# Patient Record
Sex: Male | Born: 1965 | Race: White | Hispanic: No | Marital: Married | Smoking: Never smoker
Health system: Southern US, Community
[De-identification: ages and names within clinical notes are randomized; demographics above are authoritative.]

## PROBLEM LIST (undated history)

## (undated) DIAGNOSIS — R5382 Chronic fatigue, unspecified: Secondary | ICD-10-CM

## (undated) DIAGNOSIS — IMO0002 Reserved for concepts with insufficient information to code with codable children: Secondary | ICD-10-CM

## (undated) DIAGNOSIS — Z86718 Personal history of other venous thrombosis and embolism: Secondary | ICD-10-CM

## (undated) DIAGNOSIS — I350 Nonrheumatic aortic (valve) stenosis: Secondary | ICD-10-CM

## (undated) DIAGNOSIS — F4310 Post-traumatic stress disorder, unspecified: Secondary | ICD-10-CM

## (undated) DIAGNOSIS — F1910 Other psychoactive substance abuse, uncomplicated: Secondary | ICD-10-CM

## (undated) DIAGNOSIS — K3189 Other diseases of stomach and duodenum: Secondary | ICD-10-CM

## (undated) DIAGNOSIS — R292 Abnormal reflex: Secondary | ICD-10-CM

## (undated) DIAGNOSIS — K7460 Unspecified cirrhosis of liver: Secondary | ICD-10-CM

## (undated) DIAGNOSIS — G4730 Sleep apnea, unspecified: Secondary | ICD-10-CM

## (undated) DIAGNOSIS — K5190 Ulcerative colitis, unspecified, without complications: Secondary | ICD-10-CM

## (undated) DIAGNOSIS — K635 Polyp of colon: Secondary | ICD-10-CM

## (undated) DIAGNOSIS — K766 Portal hypertension: Secondary | ICD-10-CM

## (undated) DIAGNOSIS — F1020 Alcohol dependence, uncomplicated: Secondary | ICD-10-CM

## (undated) DIAGNOSIS — T794XXA Traumatic shock, initial encounter: Secondary | ICD-10-CM

## (undated) DIAGNOSIS — I10 Essential (primary) hypertension: Secondary | ICD-10-CM

## (undated) DIAGNOSIS — F419 Anxiety disorder, unspecified: Secondary | ICD-10-CM

## (undated) DIAGNOSIS — F32A Depression, unspecified: Secondary | ICD-10-CM

## (undated) DIAGNOSIS — K219 Gastro-esophageal reflux disease without esophagitis: Secondary | ICD-10-CM

## (undated) DIAGNOSIS — T7840XA Allergy, unspecified, initial encounter: Secondary | ICD-10-CM

## (undated) HISTORY — DX: Personal history of other venous thrombosis and embolism: Z86.718

## (undated) HISTORY — DX: Polyp of colon: K63.5

## (undated) HISTORY — DX: Other diseases of stomach and duodenum: K76.6

## (undated) HISTORY — PX: HERNIA REPAIR: SHX51

## (undated) HISTORY — DX: Post-traumatic stress disorder, unspecified: F43.10

## (undated) HISTORY — DX: Reserved for concepts with insufficient information to code with codable children: IMO0002

## (undated) HISTORY — PX: CARPAL TUNNEL RELEASE: SHX101

## (undated) HISTORY — PX: SHOULDER SURGERY: SHX246

## (undated) HISTORY — PX: VASECTOMY: SHX75

## (undated) HISTORY — DX: Anxiety disorder, unspecified: F41.9

## (undated) HISTORY — DX: Nonrheumatic aortic (valve) stenosis: I35.0

## (undated) HISTORY — DX: Gastro-esophageal reflux disease without esophagitis: K21.9

## (undated) HISTORY — PX: KNEE SURGERY: SHX244

## (undated) HISTORY — DX: Depression, unspecified: F32.A

## (undated) HISTORY — DX: Abnormal reflex: R29.2

## (undated) HISTORY — PX: FRACTURE SURGERY: SHX138

## (undated) HISTORY — DX: Alcohol dependence, uncomplicated: F10.20

## (undated) HISTORY — DX: Sleep apnea, unspecified: G47.30

## (undated) HISTORY — DX: Chronic fatigue, unspecified: R53.82

## (undated) HISTORY — DX: Other psychoactive substance abuse, uncomplicated: F19.10

## (undated) HISTORY — DX: Other diseases of stomach and duodenum: K31.89

## (undated) HISTORY — DX: Essential (primary) hypertension: I10

## (undated) HISTORY — DX: Allergy, unspecified, initial encounter: T78.40XA

## (undated) HISTORY — DX: Unspecified cirrhosis of liver: K74.60

## (undated) HISTORY — DX: Ulcerative colitis, unspecified, without complications: K51.90

## (undated) HISTORY — DX: Traumatic shock, initial encounter: T79.4XXA

---

## 2007-03-24 ENCOUNTER — Ambulatory Visit (HOSPITAL_COMMUNITY): Admission: RE | Admit: 2007-03-24 | Discharge: 2007-03-24 | Payer: Self-pay | Admitting: Neurosurgery

## 2007-05-26 ENCOUNTER — Ambulatory Visit (HOSPITAL_COMMUNITY): Admission: RE | Admit: 2007-05-26 | Discharge: 2007-05-26 | Payer: Self-pay | Admitting: Neurosurgery

## 2011-01-05 NOTE — Op Note (Signed)
[name], Juan [name]             ACCOUNT NO.:  [id number]   MEDICAL RECORD NO.:  [id number]          PATIENT TYPE:  AMB   LOCATION:  SDS                          FACILITY:  [facility]   PHYSICIAN:  Faythe Ghee, M.D. DATE OF BIRTH:  [birth year]   DATE OF PROCEDURE:  03/24/2007  DATE OF DISCHARGE:                               OPERATIVE REPORT   PREOPERATIVE DIAGNOSIS:  Right carpal tunnel syndrome.   POSTOPERATIVE DIAGNOSIS:  Right carpal tunnel syndrome.   PROCEDURE:  Right carpal tunnel release.   SURGEON:  Dr. Hal Neer.   PROCEDURE IN DETAIL:  After induction of regional anesthetic on the  right upper extremity, the patient's hand, wrist and forearm were  prepped and draped in usual sterile fashion.  Curvilinear incision was  made starting at the wrist in line with the ring finger, heading up into  the palm then shifting in a radial direction.  Subcutaneous fat was  incised.  Self-retaining retractor was placed for exposure.  Transverse  carpal ligament was easily identified and incised in a proximal to  distal direction.  The nerve root was well visualized and progressively  decompressed as the markedly hypertrophic ligament was incised.  When  the nerve had been well decompressed all the way up into the mid palm  and all the way down to the distal wrist, inspection was carried out in  all directions for any evidence of residual compression, and none could  be identified.  Large amounts of irrigation carried out.  Any bleeding  was controlled with bipolar coagulation.  The wound was then closed with  interrupted Vicryl on the subcutaneous fat and interrupted nylon on the  skin.  Sterile dressing and Ace wrap were applied.  The patient was  taken to the recovery room in stable condition.           ______________________________  Faythe Ghee, M.D.     ROK/MEDQ  D:  03/24/2007  T:  03/24/2007  Job:  [id number]

## 2011-01-05 NOTE — Op Note (Signed)
Juan [name], Juan [name]             ACCOUNT NO.:  [id number]   MEDICAL RECORD NO.:  [id number]          PATIENT TYPE:  AMB   LOCATION:  SDS                          FACILITY:  [facility]   PHYSICIAN:  Faythe Ghee, M.D. DATE OF BIRTH:  1966/06/19   DATE OF PROCEDURE:  05/26/2007  DATE OF DISCHARGE:                               OPERATIVE REPORT   PREOPERATIVE DIAGNOSIS:  Left carpal tunnel syndrome.   POSTOPERATIVE DIAGNOSIS:  Left carpal tunnel syndrome.   PROCEDURE:  Left carpal tunnel release.   SURGEON:  Dr. Hal Neer.   PROCEDURE IN DETAIL:  After induction of regional anesthetic, the  patient's hand, wrist and forearm were prepped and draped in the usual  sterile fashion.  A curvilinear incision was made in the left palm  starting at the wrist in line with the ring finger heading up into the  palm and then curving slightly radially.  The subcutaneous tissue was  incised and self-retaining retractor was placed for exposure.  The  transverse carpal ligament was easily identified and dissected from a  proximal to distal direction to decompress the underlying median nerve  which was easily identified.  The ligament was found to be markedly  hypertrophic with great compression upon the nerve.  The nerve was  thoroughly decompressed both into the distal wrist and into the mid palm  until the nerve was completely decompressed.  At this time, any  adhesions were removed from the nerve.  Large amounts of irrigation were  carried out at this time.  The wound was then closed with intraoperative  Vicryl on the subcutaneous fat, interrupted nylon on the skin.  A bulky  sterile dressing was then applied along with an Ace wrap and the patient  was taken to the recovery room in stable condition.           ______________________________  Faythe Ghee, M.D.     ROK/MEDQ  D:  05/26/2007  T:  05/26/2007  Job:  [id number]

## 2011-06-03 LAB — CBC
MCV: 95.3
RBC: 4.83
WBC: 5.5

## 2011-06-03 LAB — BASIC METABOLIC PANEL
Chloride: 100
Creatinine, Ser: 1.06
GFR calc Af Amer: >60

## 2011-06-07 LAB — CBC
HCT: 48.7
Hemoglobin: 17.1 — ABNORMAL HIGH
MCV: 95.6
Platelets: 261
RBC: 5.1
WBC: 6.4

## 2014-07-29 DIAGNOSIS — M659 Synovitis and tenosynovitis, unspecified: Secondary | ICD-10-CM | POA: Insufficient documentation

## 2016-04-21 DIAGNOSIS — M75101 Unspecified rotator cuff tear or rupture of right shoulder, not specified as traumatic: Secondary | ICD-10-CM | POA: Insufficient documentation

## 2016-12-21 DIAGNOSIS — I10 Essential (primary) hypertension: Secondary | ICD-10-CM

## 2016-12-21 DIAGNOSIS — R5382 Chronic fatigue, unspecified: Secondary | ICD-10-CM | POA: Insufficient documentation

## 2016-12-21 HISTORY — DX: Essential (primary) hypertension: I10

## 2017-01-07 DIAGNOSIS — R748 Abnormal levels of other serum enzymes: Secondary | ICD-10-CM | POA: Insufficient documentation

## 2017-02-06 DIAGNOSIS — S96122A Laceration of muscle and tendon of long extensor muscle of toe at ankle and foot level, left foot, initial encounter: Secondary | ICD-10-CM | POA: Insufficient documentation

## 2017-02-06 HISTORY — DX: Laceration of muscle and tendon of long extensor muscle of toe at ankle and foot level, left foot, initial encounter: S96.122A

## 2017-11-10 ENCOUNTER — Encounter (HOSPITAL_BASED_OUTPATIENT_CLINIC_OR_DEPARTMENT_OTHER): Payer: Self-pay | Admitting: *Deleted

## 2017-11-10 ENCOUNTER — Other Ambulatory Visit: Payer: Self-pay

## 2017-11-10 DIAGNOSIS — K602 Anal fissure, unspecified: Secondary | ICD-10-CM | POA: Diagnosis not present

## 2017-11-10 DIAGNOSIS — K6289 Other specified diseases of anus and rectum: Secondary | ICD-10-CM | POA: Diagnosis present

## 2017-11-10 NOTE — ED Triage Notes (Signed)
Abdominal and rectal pain for a few days. Bright red rectal bleeding that his GI doctor is aware of. He was seen by a GI specialist 2 days ago. He is scheduled for a flex sigmoid next week. He is here tonight because he has a protrusion coming from his rectum. Hx of polyps in the past.

## 2017-11-11 ENCOUNTER — Encounter (HOSPITAL_BASED_OUTPATIENT_CLINIC_OR_DEPARTMENT_OTHER): Payer: Self-pay | Admitting: Emergency Medicine

## 2017-11-11 ENCOUNTER — Emergency Department (HOSPITAL_BASED_OUTPATIENT_CLINIC_OR_DEPARTMENT_OTHER)
Admission: EM | Admit: 2017-11-11 | Discharge: 2017-11-11 | Disposition: A | Discharge status: Home / Self Care | Payer: BLUE CROSS/BLUE SHIELD | Attending: Emergency Medicine | Admitting: Emergency Medicine

## 2017-11-11 DIAGNOSIS — K602 Anal fissure, unspecified: Secondary | ICD-10-CM

## 2017-11-11 LAB — OCCULT BLOOD X 1 CARD TO LAB, STOOL: Fecal Occult Bld: NEGATIVE

## 2017-11-11 MED ORDER — LIDOCAINE HCL 2 % EX GEL
1.0000 "application " | Freq: Once | CUTANEOUS | Status: AC
  Administered 2017-11-11: 02:00:00 via TOPICAL

## 2017-11-11 MED ORDER — LIDOCAINE HCL 2 % EX GEL
CUTANEOUS | Status: AC
  Filled 2017-11-11: qty 20

## 2017-11-11 NOTE — ED Provider Notes (Addendum)
[facility] EMERGENCY DEPARTMENT Provider Note   CSN: [id number] Arrival date & time: 11/10/17  2256     History   Chief Complaint Chief Complaint  Patient presents with  . Abdominal Pain    HPI Juan [name] is a 52 y.o. male.  The history is provided by the patient.  Illness  This is a new problem. The current episode started more than 1 week ago (a month ago). The problem occurs constantly. The problem has not changed since onset.Pertinent negatives include no chest pain, no abdominal pain and no shortness of breath. Nothing aggravates the symptoms. Nothing relieves the symptoms. He has tried nothing for the symptoms. The treatment provided no relief.  Has has red blood on tissue and sensation of rectal mass and straining.  Has seen GI and they have not yet found anything.  Scheduled for a flex sign at [facility] this coming week.    History reviewed. No pertinent past medical history.  There are no active problems to display for this patient.   Past Surgical History:  Procedure Laterality Date  . HERNIA REPAIR    . VASECTOMY         Home Medications    Prior to Admission medications   Not on File    Family History No family history on file.  Social History Social History   Tobacco Use  . Smoking status: Never Smoker  . Smokeless tobacco: Current User    Types: Snuff  Substance Use Topics  . Alcohol use: Yes  . Drug use: Never     Allergies   Codeine and Depakote er [divalproex sodium er]   Review of Systems Review of Systems  Constitutional: Negative for fever.  Respiratory: Negative for shortness of breath.   Cardiovascular: Negative for chest pain.  Gastrointestinal: Positive for anal bleeding, constipation and rectal pain. Negative for abdominal pain, blood in stool, diarrhea, nausea and vomiting.  Genitourinary: Negative for dysuria.  Musculoskeletal: Negative for back pain.  Neurological: Negative for light-headedness.  All  other systems reviewed and are negative.    Physical Exam Updated Vital Signs BP (!) 99/58 (BP Location: Left Arm)   Pulse 77   Temp 98 F (36.7 C) (Oral)   Resp 16   Ht 5\' 5"  (1.651 m)   Wt 73.9 kg (163 lb)   SpO2 96%   BMI 27.12 kg/m   Physical Exam  Constitutional: He is oriented to person, place, and time. He appears well-developed and well-nourished. No distress.  HENT:  Head: Normocephalic and atraumatic.  Mouth/Throat: No oropharyngeal exudate.  Eyes: Pupils are equal, round, and reactive to light. Conjunctivae are normal.  Neck: Normal range of motion. Neck supple. No JVD present.  Cardiovascular: Normal rate, regular rhythm, normal heart sounds and intact distal pulses.  Pulmonary/Chest: Effort normal and breath sounds normal. No stridor. He has no wheezes. He has no rales.  Abdominal: Soft. Bowel sounds are normal. He exhibits no mass. There is no tenderness. There is no rebound and no guarding. No hernia.  Genitourinary: Rectal exam shows guaiac negative stool.  Musculoskeletal: Normal range of motion.  Neurological: He is alert and oriented to person, place, and time. He displays normal reflexes.  Skin: Skin is warm and dry. Capillary refill takes less than 2 seconds.  Psychiatric: He has a normal mood and affect.     ED Treatments / Results  Labs (all labs ordered are listed, but only abnormal results are displayed) Results for orders placed or performed  during the hospital encounter of 11/11/17  Occult blood card to lab, stool  Result Value Ref Range   Fecal Occult Bld NEGATIVE NEGATIVE   No results found.   Radiology No results found.  Procedures LOWER ENDOSCOPY Date/Time: 11/11/2017 5:20 AM Performed by: Cy BlamerPalumbo, Athan Casalino, MD Authorized by: Cy BlamerPalumbo, Harper Vandervoort, MD  Consent: Verbal consent obtained. Consent given by: patient Patient understanding: patient states understanding of the procedure being performed Patient identity confirmed: arm  band Indications: rectal bleeding and rectal pain  Anesthesia: Local anesthetic: lidocaine jelly.  Sedation: Patient sedated: no  Scope type: anoscope External exam performed: yes Negative external exam findings: no pilonidal sinus tract, no pilonidal cyst, no pilonidal tenderness, no perianal skin tags, no perirectal warts, no perianal maceration, no perianal induration and no perianal erythema Positive internal exam findings: anal fissures Negative internal exam findings: no intraluminal mass, no inflammation, no anal fistulae, no anal stricture and no abscess Anal fissure position: six o'clock Procedure termination: procedure complete Patient tolerance: Patient tolerated the procedure well with no immediate complications     (including critical care time)  Medications Ordered in ED Medications  lidocaine (XYLOCAINE) 2 % jelly 1 application ( Topical Given by Other 11/11/17 0141)       Final Clinical Impressions(s) / ED Diagnoses   Final diagnoses:  Anal fissure    Follow up with GI as previously scheduled for flex sig  Return for weakness, numbness, changes in vision or speech, fevers >100.4 unrelieved by medication, shortness of breath, intractable vomiting, or diarrhea, abdominal pain, Inability to tolerate liquids or food, cough, altered mental status or any concerns. No signs of systemic illness or infection. The patient is nontoxic-appearing on exam and vital signs are within normal limits.   I have reviewed the triage vital signs and the nursing notes. Pertinent labs &imaging results that were available during my care of the patient were reviewed by me and considered in my medical decision making (see chart for details).  After history, exam, and medical workup I feel the patient has been appropriately medically screened and is safe for discharge home. Pertinent diagnoses were discussed with the patient. Patient was given return precautions.   Dejane Scheibe,  Dominyck Reser, MD 11/11/17 [id number]    Cy BlamerPalumbo, Secilia Apps, MD 11/11/17 [id number]

## 2017-12-02 DIAGNOSIS — K5190 Ulcerative colitis, unspecified, without complications: Secondary | ICD-10-CM | POA: Insufficient documentation

## 2018-03-12 ENCOUNTER — Emergency Department (HOSPITAL_BASED_OUTPATIENT_CLINIC_OR_DEPARTMENT_OTHER): Payer: BLUE CROSS/BLUE SHIELD

## 2018-03-12 ENCOUNTER — Encounter (HOSPITAL_BASED_OUTPATIENT_CLINIC_OR_DEPARTMENT_OTHER): Payer: Self-pay

## 2018-03-12 ENCOUNTER — Emergency Department (HOSPITAL_BASED_OUTPATIENT_CLINIC_OR_DEPARTMENT_OTHER)
Admission: EM | Admit: 2018-03-12 | Discharge: 2018-03-12 | Disposition: A | Discharge status: Home / Self Care | Payer: BLUE CROSS/BLUE SHIELD | Attending: Emergency Medicine | Admitting: Emergency Medicine

## 2018-03-12 DIAGNOSIS — Y9239 Other specified sports and athletic area as the place of occurrence of the external cause: Secondary | ICD-10-CM | POA: Insufficient documentation

## 2018-03-12 DIAGNOSIS — S299XXA Unspecified injury of thorax, initial encounter: Secondary | ICD-10-CM | POA: Diagnosis present

## 2018-03-12 DIAGNOSIS — Y9389 Activity, other specified: Secondary | ICD-10-CM | POA: Insufficient documentation

## 2018-03-12 DIAGNOSIS — Y998 Other external cause status: Secondary | ICD-10-CM | POA: Insufficient documentation

## 2018-03-12 DIAGNOSIS — S20212A Contusion of left front wall of thorax, initial encounter: Secondary | ICD-10-CM | POA: Diagnosis not present

## 2018-03-12 DIAGNOSIS — W01198A Fall on same level from slipping, tripping and stumbling with subsequent striking against other object, initial encounter: Secondary | ICD-10-CM | POA: Insufficient documentation

## 2018-03-12 MED ORDER — ONDANSETRON 4 MG PO TBDP
4.0000 mg | ORAL_TABLET | Freq: Once | ORAL | Status: AC
  Administered 2018-03-12: 4 mg via ORAL
  Filled 2018-03-12: qty 1

## 2018-03-12 MED ORDER — HYDROCODONE-ACETAMINOPHEN 5-325 MG PO TABS: 1.0000 | ORAL_TABLET | Freq: Four times a day (QID) | ORAL | 0 refills | Status: DC | PRN

## 2018-03-12 MED ORDER — HYDROCODONE-ACETAMINOPHEN 5-325 MG PO TABS
2.0000 | ORAL_TABLET | Freq: Once | ORAL | Status: AC
  Administered 2018-03-12: 2 via ORAL
  Filled 2018-03-12: qty 2

## 2018-03-12 MED ORDER — IBUPROFEN 800 MG PO TABS
800.0000 mg | ORAL_TABLET | Freq: Once | ORAL | Status: AC
  Administered 2018-03-12: 800 mg via ORAL
  Filled 2018-03-12: qty 1
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
## 2018-03-12 NOTE — Progress Notes (Signed)
PT instructed on Incentive Spirometer per MD request.  Pt performed times 5 with great technique and effort reaching 1800-206700ml.  Pt instructed on frequency and importance.  Pt stated he understood and had no questions.  RT will continue to monitor.
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
## 2018-03-12 NOTE — ED Notes (Signed)
Pt teaching provided on medications that may cause drowsiness. Pt instructed not to drive or operate heavy machinery while taking the prescribed medication. Pt verbalized understanding.   

## 2018-03-22 ENCOUNTER — Emergency Department (HOSPITAL_BASED_OUTPATIENT_CLINIC_OR_DEPARTMENT_OTHER)
Admission: EM | Admit: 2018-03-22 | Discharge: 2018-03-23 | Disposition: A | Discharge status: Home / Self Care | Payer: BLUE CROSS/BLUE SHIELD | Attending: Emergency Medicine | Admitting: Emergency Medicine

## 2018-03-22 ENCOUNTER — Other Ambulatory Visit: Payer: Self-pay

## 2018-03-22 ENCOUNTER — Emergency Department (HOSPITAL_BASED_OUTPATIENT_CLINIC_OR_DEPARTMENT_OTHER): Payer: BLUE CROSS/BLUE SHIELD

## 2018-03-22 ENCOUNTER — Encounter (HOSPITAL_BASED_OUTPATIENT_CLINIC_OR_DEPARTMENT_OTHER): Payer: Self-pay

## 2018-03-22 DIAGNOSIS — E876 Hypokalemia: Secondary | ICD-10-CM | POA: Diagnosis not present

## 2018-03-22 DIAGNOSIS — Y929 Unspecified place or not applicable: Secondary | ICD-10-CM | POA: Diagnosis not present

## 2018-03-22 DIAGNOSIS — S2232XA Fracture of one rib, left side, initial encounter for closed fracture: Secondary | ICD-10-CM | POA: Diagnosis not present

## 2018-03-22 DIAGNOSIS — Y939 Activity, unspecified: Secondary | ICD-10-CM | POA: Insufficient documentation

## 2018-03-22 DIAGNOSIS — S299XXA Unspecified injury of thorax, initial encounter: Secondary | ICD-10-CM | POA: Diagnosis present

## 2018-03-22 DIAGNOSIS — Y998 Other external cause status: Secondary | ICD-10-CM | POA: Insufficient documentation

## 2018-03-22 DIAGNOSIS — R1012 Left upper quadrant pain: Secondary | ICD-10-CM | POA: Diagnosis not present

## 2018-03-22 HISTORY — DX: Hemochromatosis, unspecified: E83.119

## 2018-03-22 LAB — BASIC METABOLIC PANEL
ANION GAP: 12 (ref 5–15)
BUN: 7 mg/dL (ref 6–20)
CALCIUM: 9 mg/dL (ref 8.9–10.3)
CO2: 26 mmol/L (ref 22–32)
Chloride: 101 mmol/L (ref 98–111)
Creatinine, Ser: 0.96 mg/dL (ref 0.61–1.24)
GFR calc Af Amer: >60 mL/min (ref >60)
Glucose, Bld: 91 mg/dL (ref 70–99)
POTASSIUM: 2.8 mmol/L — AB (ref 3.5–5.1)
SODIUM: 139 mmol/L (ref 135–145)

## 2018-03-22 MED ORDER — OXYCODONE-ACETAMINOPHEN 5-325 MG PO TABS
1.0000 | ORAL_TABLET | Freq: Once | ORAL | Status: AC
  Administered 2018-03-22: 1 via ORAL
  Filled 2018-03-22: qty 1

## 2018-03-22 MED ORDER — KETOROLAC TROMETHAMINE 30 MG/ML IJ SOLN: 30.0000 mg | Freq: Once | INTRAMUSCULAR | Status: DC

## 2018-03-22 MED ORDER — POTASSIUM CHLORIDE CRYS ER 20 MEQ PO TBCR
40.0000 meq | EXTENDED_RELEASE_TABLET | Freq: Once | ORAL | Status: AC
  Administered 2018-03-22: 40 meq via ORAL
  Filled 2018-03-22: qty 2

## 2018-03-22 MED ORDER — KETOROLAC TROMETHAMINE 30 MG/ML IJ SOLN
30.0000 mg | Freq: Once | INTRAMUSCULAR | Status: AC
  Administered 2018-03-22: 30 mg via INTRAMUSCULAR
  Filled 2018-03-22: qty 1

## 2018-03-22 MED ORDER — IOPAMIDOL (ISOVUE-300) INJECTION 61%
100.0000 mL | Freq: Once | INTRAVENOUS | Status: AC | PRN
  Administered 2018-03-22: 100 mL via INTRAVENOUS

## 2018-03-22 NOTE — ED Notes (Signed)
ED Provider at bedside. 
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
## 2018-03-22 NOTE — ED Notes (Signed)
Patient transported to CT 
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
## 2018-03-23 LAB — CBC WITH DIFFERENTIAL/PLATELET
BASOS ABS: 0 10*3/uL (ref 0.0–0.1)
Basophils Relative: 0 %
EOS ABS: 0.3 10*3/uL (ref 0.0–0.7)
Eosinophils Relative: 5 %
HCT: 38.1 % — ABNORMAL LOW (ref 39.0–52.0)
Hemoglobin: 14.3 g/dL (ref 13.0–17.0)
LYMPHS ABS: 2.8 10*3/uL (ref 0.7–4.0)
Lymphocytes Relative: 43 %
MCH: 35.9 pg — ABNORMAL HIGH (ref 26.0–34.0)
MCHC: 37.5 g/dL — ABNORMAL HIGH (ref 30.0–36.0)
MCV: 95.7 fL (ref 78.0–100.0)
MONO ABS: 0.9 10*3/uL (ref 0.1–1.0)
Monocytes Relative: 13 %
NEUTROS PCT: 39 %
Neutro Abs: 2.6 10*3/uL (ref 1.7–7.7)
PLATELETS: 133 10*3/uL — AB (ref 150–400)
RBC: 3.98 MIL/uL — AB (ref 4.22–5.81)
RDW: 13.5 % (ref 11.5–15.5)
WBC: 6.6 10*3/uL (ref 4.0–10.5)

## 2018-03-23 MED ORDER — POTASSIUM CHLORIDE ER 20 MEQ PO TBCR: 20.0000 meq | EXTENDED_RELEASE_TABLET | Freq: Two times a day (BID) | ORAL | 0 refills | Status: DC

## 2018-03-23 MED ORDER — HYDROCODONE-ACETAMINOPHEN 5-325 MG PO TABS: 1.0000 | ORAL_TABLET | Freq: Four times a day (QID) | ORAL | 0 refills | Status: DC | PRN

## 2018-03-23 NOTE — Discharge Instructions (Addendum)
Take ibuprofen for pain.  Norco for severe pain only.  Take potassium supplements as prescribed.  Increase your potassium intake.  Follow-up with your doctor as needed.

## 2018-03-23 NOTE — ED Notes (Signed)
ED Provider at bedside. 

## 2018-11-18 DIAGNOSIS — G729 Myopathy, unspecified: Secondary | ICD-10-CM | POA: Insufficient documentation

## 2018-11-18 DIAGNOSIS — G721 Alcoholic myopathy: Secondary | ICD-10-CM

## 2018-11-18 HISTORY — DX: Alcoholic myopathy: G72.1

## 2019-01-19 DIAGNOSIS — Z8719 Personal history of other diseases of the digestive system: Secondary | ICD-10-CM

## 2019-01-19 DIAGNOSIS — Z9889 Other specified postprocedural states: Secondary | ICD-10-CM | POA: Insufficient documentation

## 2019-01-19 HISTORY — DX: Personal history of other diseases of the digestive system: Z87.19

## 2019-04-15 DIAGNOSIS — F332 Major depressive disorder, recurrent severe without psychotic features: Secondary | ICD-10-CM

## 2019-04-15 HISTORY — DX: Major depressive disorder, recurrent severe without psychotic features: F33.2

## 2019-04-16 DIAGNOSIS — F4312 Post-traumatic stress disorder, chronic: Secondary | ICD-10-CM | POA: Insufficient documentation

## 2019-04-16 DIAGNOSIS — F411 Generalized anxiety disorder: Secondary | ICD-10-CM | POA: Insufficient documentation

## 2019-04-16 DIAGNOSIS — F4310 Post-traumatic stress disorder, unspecified: Secondary | ICD-10-CM | POA: Insufficient documentation

## 2019-09-30 DIAGNOSIS — K709 Alcoholic liver disease, unspecified: Secondary | ICD-10-CM | POA: Insufficient documentation

## 2019-10-15 LAB — HM COLONOSCOPY

## 2019-12-05 DIAGNOSIS — K208 Other esophagitis without bleeding: Secondary | ICD-10-CM | POA: Insufficient documentation

## 2019-12-05 HISTORY — DX: Other esophagitis without bleeding: K20.80

## 2020-01-17 DIAGNOSIS — S128XXA Fracture of other parts of neck, initial encounter: Secondary | ICD-10-CM

## 2020-01-17 HISTORY — DX: Fracture of other parts of neck, initial encounter: S12.8XXA

## 2020-05-05 DIAGNOSIS — M25511 Pain in right shoulder: Secondary | ICD-10-CM | POA: Diagnosis not present

## 2020-05-05 DIAGNOSIS — I10 Essential (primary) hypertension: Secondary | ICD-10-CM | POA: Diagnosis not present

## 2020-05-05 DIAGNOSIS — R202 Paresthesia of skin: Secondary | ICD-10-CM | POA: Diagnosis not present

## 2020-05-05 DIAGNOSIS — Z23 Encounter for immunization: Secondary | ICD-10-CM | POA: Diagnosis not present

## 2020-05-05 DIAGNOSIS — Z0189 Encounter for other specified special examinations: Secondary | ICD-10-CM | POA: Diagnosis not present

## 2020-05-06 DIAGNOSIS — F17220 Nicotine dependence, chewing tobacco, uncomplicated: Secondary | ICD-10-CM | POA: Diagnosis not present

## 2020-05-06 DIAGNOSIS — F1021 Alcohol dependence, in remission: Secondary | ICD-10-CM | POA: Diagnosis not present

## 2020-05-06 DIAGNOSIS — F419 Anxiety disorder, unspecified: Secondary | ICD-10-CM | POA: Diagnosis not present

## 2020-05-06 DIAGNOSIS — G4700 Insomnia, unspecified: Secondary | ICD-10-CM | POA: Diagnosis not present

## 2020-05-07 DIAGNOSIS — Z0189 Encounter for other specified special examinations: Secondary | ICD-10-CM | POA: Diagnosis not present

## 2020-05-20 ENCOUNTER — Ambulatory Visit (INDEPENDENT_AMBULATORY_CARE_PROVIDER_SITE_OTHER): Payer: 59

## 2020-05-20 ENCOUNTER — Ambulatory Visit (INDEPENDENT_AMBULATORY_CARE_PROVIDER_SITE_OTHER): Payer: 59 | Admitting: Family Medicine

## 2020-05-20 ENCOUNTER — Encounter: Payer: Self-pay | Admitting: Family Medicine

## 2020-05-20 ENCOUNTER — Other Ambulatory Visit: Payer: Self-pay

## 2020-05-20 VITALS — BP 144/89 | HR 89 | Temp 98.3°F | Wt 173.3 lb

## 2020-05-20 DIAGNOSIS — F1011 Alcohol abuse, in remission: Secondary | ICD-10-CM | POA: Insufficient documentation

## 2020-05-20 DIAGNOSIS — F4312 Post-traumatic stress disorder, chronic: Secondary | ICD-10-CM | POA: Diagnosis not present

## 2020-05-20 DIAGNOSIS — M5412 Radiculopathy, cervical region: Secondary | ICD-10-CM | POA: Diagnosis not present

## 2020-05-20 DIAGNOSIS — M4802 Spinal stenosis, cervical region: Secondary | ICD-10-CM | POA: Diagnosis not present

## 2020-05-20 DIAGNOSIS — R222 Localized swelling, mass and lump, trunk: Secondary | ICD-10-CM | POA: Diagnosis not present

## 2020-05-20 DIAGNOSIS — K709 Alcoholic liver disease, unspecified: Secondary | ICD-10-CM | POA: Diagnosis not present

## 2020-05-20 DIAGNOSIS — M25511 Pain in right shoulder: Secondary | ICD-10-CM

## 2020-05-20 DIAGNOSIS — M4722 Other spondylosis with radiculopathy, cervical region: Secondary | ICD-10-CM | POA: Diagnosis not present

## 2020-05-20 DIAGNOSIS — M7541 Impingement syndrome of right shoulder: Secondary | ICD-10-CM

## 2020-05-20 DIAGNOSIS — I10 Essential (primary) hypertension: Secondary | ICD-10-CM | POA: Diagnosis not present

## 2020-05-20 HISTORY — DX: Impingement syndrome of right shoulder: M75.41

## 2020-05-20 HISTORY — DX: Alcohol abuse, in remission: F10.11

## 2020-05-20 MED ORDER — PREDNISONE 10 MG (21) PO TBPK: ORAL_TABLET | ORAL | 0 refills | Status: DC

## 2020-05-20 NOTE — Assessment & Plan Note (Addendum)
Reports 140 days of sobriety.  He attends support group meetings, encouraged to continue with this.   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  Continue current vitamins and supplements.  EGD earlier this year without varices.

## 2020-05-20 NOTE — Assessment & Plan Note (Signed)
He has radicular symptoms as well as some pain with ROM testing.  No significant rotator cuff weakness.  Xrays of shoulder and neck ordered.  Start prednisone taper.  Discussed that if this is not improving I would like to have Dr. Darene Lamer see him.

## 2020-05-20 NOTE — Assessment & Plan Note (Signed)
Managed by psychiatry at the [place].

## 2020-05-20 NOTE — Patient Instructions (Signed)
Great to meet you today! Have xray completed downstairs.  Start prednisone taper.  If not improving with this let me know.

## 2020-05-20 NOTE — Progress Notes (Signed)
Juan [name] - 54 y.o. male MRN [id number]  Date of birth: 1966-05-27  Subjective Chief Complaint  Patient presents with  . Establish Care  . Fall    HPI Juan [name] is a a 54 y.o. male here today for initial visit.  He has history of HTN, depression with anxiety and PTSD, and alcohol abuse.   He has complaint of R shoulder pain today.   -Shoulder pain:  Reports having a fall into a wall while walking up stairs a couple of weeks ago impacting his R shoulder.  He has had pain on the R side of his neck that radiates into his shoulder and down his arm.  He has had some numbness and tingling in the 4th and 5th digit of the R hand.  He has pain with movement of the shoulder as well.  He has had previous surgery on R shoulder due to prior rotator cuff tear with SLAP lesion  -PTSD/Depression/Anxiety:  Related to previous [employer]. This is managed by psychiatry at the [place]. He was also seen by outpatient psychiatrist through [facility] in July.  Current medications include lexapro, buspar and mitrazapine.  Stable symptoms with current medications.  He does have history of alcoholism, reports that he is 140 days sober.  He does attend support group meetings  -HTN:  Managed with lisinopril 66m daily.  He is doing well with this and denies symptoms related to HTN including chest pain, shortness of breath, palpitations, or vision changes.    ROS:  A comprehensive ROS was completed and negative except as noted per HPI  Allergies  Allergen Reactions  . Charentais Melon (French Melon) Anaphylaxis  . Cucumber Extract Itching and Nausea And Vomiting    No extracts; just cucumber   . Other Anaphylaxis  . Peanut Butter Flavor Anaphylaxis and Swelling  . Peanut Oil Swelling  . Shellfish Allergy Itching and Swelling  . Cantaloupe Extract Allergy Skin Test Rash  . Lactose Other (See Comments)  . Strawberry Extract Nausea And Vomiting and Swelling  . Vancomycin Rash  . Codeine     itching   . Depakote Er [Divalproex Sodium Er]     Tongue swelling    Past Medical History:  Diagnosis Date  . [place] ([place]   . Anxiety   . Colon polyps   . Depression   . Hemochromatosis   . Hx of blood clots    Leg  . Hypertension     Past Surgical History:  Procedure Laterality Date  . FRACTURE SURGERY    . HERNIA REPAIR    . VASECTOMY      Social History   Socioeconomic History  . Marital status: Married    Spouse name: Not on file  . Number of children: Not on file  . Years of education: Not on file  . Highest education level: Not on file  Occupational History  . Not on file  Tobacco Use  . Smoking status: Never Smoker  . Smokeless tobacco: Current User    Types: Snuff  Vaping Use  . Vaping Use: Never used  Substance and Sexual Activity  . Alcohol use: Not Currently  . Drug use: Never  . Sexual activity: Yes    Partners: Female  Other Topics Concern  . Not on file  Social History Narrative  . Not on file   Social Determinants of Health   Financial Resource Strain:   . Difficulty of Paying Living Expenses: Not on file  Food Insecurity:   .  Worried About Charity fundraiser in the Last Year: Not on file  . Ran Out of Food in the Last Year: Not on file  Transportation Needs:   . Lack of Transportation (Medical): Not on file  . Lack of Transportation (Non-Medical): Not on file  Physical Activity:   . Days of Exercise per Week: Not on file  . Minutes of Exercise per Session: Not on file  Stress:   . Feeling of Stress : Not on file  Social Connections:   . Frequency of Communication with Friends and Family: Not on file  . Frequency of Social Gatherings with Friends and Family: Not on file  . Attends Religious Services: Not on file  . Active Member of Clubs or Organizations: Not on file  . Attends Archivist Meetings: Not on file  . Marital Status: Not on file    No family history on file.  Health Maintenance  Topic Date Due  .  Hepatitis C Screening  Never done  . HIV Screening  Never done  . COLONOSCOPY  Never done  . INFLUENZA VACCINE  03/23/2020  . TETANUS/TDAP  06/20/2029  . COVID-19 Vaccine  Completed     ----------------------------------------------------------------------------------------------------------------------------------------------------------------------------------------------------------------- Physical Exam BP (!) 144/89 (BP Location: Left Arm, Patient Position: Sitting, Cuff Size: Normal)   Pulse 89   Temp 98.3 F (36.8 C) (Oral)   Wt 173 lb 4.8 oz (78.6 kg)   SpO2 95%   BMI 28.84 kg/m   Physical Exam Constitutional:      Appearance: Normal appearance.  HENT:     Head: Normocephalic.  Eyes:     General: No scleral icterus. Cardiovascular:     Rate and Rhythm: Normal rate and regular rhythm.  Pulmonary:     Effort: Pulmonary effort is normal.     Breath sounds: Normal breath sounds.  Musculoskeletal:     Cervical back: Neck supple.     Comments: TTP along upper trapezius.  ROM is normal but with pain on full abduction and flexion.  Rotator cuff strength is normal.  Negative impingement testing.  +Spurlings on R  Neurological:     General: No focal deficit present.     Mental Status: He is alert.  Psychiatric:        Mood and Affect: Mood normal.        Behavior: Behavior normal.     ------------------------------------------------------------------------------------------------------------------------------------------------------------------------------------------------------------------- Assessment and Plan  Benign essential hypertension BP mildly elevated today.  Readings well controlled at home.   Recommend low sodium diet and continuation of lisinopril.    Chronic post-traumatic stress disorder (PTSD) Managed by psychiatry at the [place].    History of alcohol abuse Reports 140 days of sobriety.  He attends support group meetings, encouraged to continue with  this.   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  Continue current vitamins and supplements.  EGD earlier this year without varices.    Acute pain of right shoulder He has radicular symptoms as well as some pain with ROM testing.  No significant rotator cuff weakness.  Xrays of shoulder and neck ordered.  Start prednisone taper.  Discussed that if this is not improving I would like to have Dr. Darene Lamer see him.    Meds ordered this encounter  Medications  . predniSONE (STERAPRED UNI-PAK 21 TAB) 10 MG (21) TBPK tablet    Sig: Taper as directed on packaging.    Dispense:  21 tablet    Refill:  0  No follow-ups on file.    This visit occurred during the SARS-CoV-2 public health emergency.  Safety protocols were in place, including screening questions prior to the visit, additional usage of staff PPE, and extensive cleaning of exam room while observing appropriate contact time as indicated for disinfecting solutions.

## 2020-05-20 NOTE — Assessment & Plan Note (Signed)
BP mildly elevated today.  Readings well controlled at home.   Recommend low sodium diet and continuation of lisinopril.

## 2020-05-21 ENCOUNTER — Encounter: Payer: Self-pay | Admitting: Family Medicine

## 2020-05-26 ENCOUNTER — Encounter: Payer: Self-pay | Admitting: Family Medicine

## 2020-05-29 ENCOUNTER — Other Ambulatory Visit: Payer: Self-pay | Admitting: Family Medicine

## 2020-05-29 MED ORDER — FOLIC ACID 1 MG PO TABS
1.0000 mg | ORAL_TABLET | Freq: Every day | ORAL | 1 refills | Status: DC
Start: 2020-05-29 — End: 2020-05-29

## 2020-05-29 MED ORDER — LISINOPRIL 10 MG PO TABS: 10.0000 mg | ORAL_TABLET | Freq: Every day | ORAL | 3 refills | Status: DC

## 2020-05-29 MED FILL — FOLIC ACID 1 MG TABS: 1 | 90 days supply | Qty: 90 | Fill #0

## 2020-05-29 MED FILL — LISINOPRIL 10 MG TABS: 10 | 90 days supply | Qty: 90 | Fill #0

## 2020-05-29 NOTE — Telephone Encounter (Signed)
Pt is requesting med refills for lisinopril and folic acid. Written by historical provider. Rxs pended.

## 2020-06-03 ENCOUNTER — Ambulatory Visit: Payer: 59 | Admitting: Family Medicine

## 2020-06-04 DIAGNOSIS — R262 Difficulty in walking, not elsewhere classified: Secondary | ICD-10-CM | POA: Diagnosis not present

## 2020-06-04 DIAGNOSIS — G7100 Muscular dystrophy, unspecified: Secondary | ICD-10-CM | POA: Diagnosis not present

## 2020-06-06 DIAGNOSIS — M25511 Pain in right shoulder: Secondary | ICD-10-CM | POA: Diagnosis not present

## 2020-06-11 ENCOUNTER — Ambulatory Visit: Payer: 59 | Admitting: Family Medicine

## 2020-06-23 ENCOUNTER — Ambulatory Visit: Payer: BLUE CROSS/BLUE SHIELD | Admitting: Family Medicine

## 2020-07-08 ENCOUNTER — Other Ambulatory Visit: Payer: Self-pay | Admitting: Family Medicine

## 2020-07-08 ENCOUNTER — Encounter: Payer: Self-pay | Admitting: Family Medicine

## 2020-07-08 ENCOUNTER — Telehealth (INDEPENDENT_AMBULATORY_CARE_PROVIDER_SITE_OTHER): Payer: 59 | Admitting: Family Medicine

## 2020-07-08 VITALS — BP 134/100 | HR 90 | Wt 168.0 lb

## 2020-07-08 DIAGNOSIS — M25511 Pain in right shoulder: Secondary | ICD-10-CM

## 2020-07-08 DIAGNOSIS — T7840XA Allergy, unspecified, initial encounter: Secondary | ICD-10-CM

## 2020-07-08 DIAGNOSIS — F411 Generalized anxiety disorder: Secondary | ICD-10-CM | POA: Diagnosis not present

## 2020-07-08 DIAGNOSIS — M5412 Radiculopathy, cervical region: Secondary | ICD-10-CM

## 2020-07-08 DIAGNOSIS — R29898 Other symptoms and signs involving the musculoskeletal system: Secondary | ICD-10-CM

## 2020-07-08 HISTORY — DX: Allergy, unspecified, initial encounter: T78.40XA

## 2020-07-08 MED ORDER — EPINEPHRINE 0.3 MG/0.3ML IJ SOAJ: 0.3000 mg | INTRAMUSCULAR | 0 refills | Status: DC | PRN

## 2020-07-08 MED ORDER — BUSPIRONE HCL 10 MG PO TABS
10.0000 mg | ORAL_TABLET | Freq: Three times a day (TID) | ORAL | 3 refills | Status: DC
Start: 2020-07-08 — End: 2020-07-10

## 2020-07-08 MED FILL — EPINEPHRINE 0.3 MG AUTO-INJ: 0.3 | 2 days supply | Qty: 2 | Fill #0

## 2020-07-08 MED FILL — busPIRone HCL 10 MG TABS: 10 | 30 days supply | Qty: 90 | Fill #0

## 2020-07-08 NOTE — Progress Notes (Signed)
Juan [name] - 54 y.o. male MRN [id number]  Date of birth: 1966/01/09   This visit type was conducted due to national recommendations for restrictions regarding the COVID-19 Pandemic (e.g. social distancing).  This format is felt to be most appropriate for this patient at this time.  All issues noted in this document were discussed and addressed.  No physical exam was performed (except for noted visual exam findings with Video Visits).  I discussed the limitations of evaluation and management by telemedicine and the availability of in person appointments. The patient expressed understanding and agreed to proceed.  I connected with@ on 07/08/20 at 10:30 AM EST by a video enabled telemedicine application and verified that I am speaking with the correct person using two identifiers.  Present at visit: [name], DO [name]   Patient Location: Home [facility] [place] [address]   Provider location:   [place]  No chief complaint on file.   HPI  Juan [name] is a 54 y.o. male who presents via audio/video conferencing for a telehealth visit today.  He is following up today for shoulder pain.  He also had to use Epi-pen a few days ago.    He was seen a few weeks ago for R sided shoulder pain with radicular features.  Treated with course of steroids and had some improvement but still with some pain.  Xrays of neck and shoulder unremarkable.  He is having some paresthesias into the hand.  No weakness.    He had to use epi pen the other day.  He unknowingly ate something he was allergic to.  Had some mild swelling around mouth and throat.  Relieved with epi-pen.  No residual effects at this time.    He would like a refill on buspar.  He is seeing psychiatry through the [place] but has had trouble reaching them to obtain refill.    ROS:  A comprehensive ROS was completed and negative except as noted per HPI  Past Medical History:  Diagnosis Date  . Alcohol addiction ([place])   .  Anxiety   . Colon polyps   . Depression   . Hemochromatosis   . Hx of blood clots    Leg  . Hypertension     Past Surgical History:  Procedure Laterality Date  . FRACTURE SURGERY    . HERNIA REPAIR    . VASECTOMY      No family history on file.  Social History   Socioeconomic History  . Marital status: Married    Spouse name: Not on file  . Number of children: Not on file  . Years of education: Not on file  . Highest education level: Not on file  Occupational History  . Not on file  Tobacco Use  . Smoking status: Never Smoker  . Smokeless tobacco: Current User    Types: Snuff  Vaping Use  . Vaping Use: Never used  Substance and Sexual Activity  . Alcohol use: Not Currently  . Drug use: Never  . Sexual activity: Yes    Partners: Female  Other Topics Concern  . Not on file  Social History Narrative  . Not on file   Social Determinants of Health   Financial Resource Strain:   . Difficulty of Paying Living Expenses: Not on file  Food Insecurity:   . Worried About Charity fundraiser in the Last Year: Not on file  . Ran Out of Food in the Last Year: Not on file  Transportation Needs:   . Film/video editor (Medical): Not on file  . Lack of Transportation (Non-Medical): Not on file  Physical Activity:   . Days of Exercise per Week: Not on file  . Minutes of Exercise per Session: Not on file  Stress:   . Feeling of Stress : Not on file  Social Connections:   . Frequency of Communication with Friends and Family: Not on file  . Frequency of Social Gatherings with Friends and Family: Not on file  . Attends Religious Services: Not on file  . Active Member of Clubs or Organizations: Not on file  . Attends Archivist Meetings: Not on file  . Marital Status: Not on file  Intimate Partner Violence:   . Fear of Current or Ex-Partner: Not on file  . Emotionally Abused: Not on file  . Physically Abused: Not on file  . Sexually Abused: Not on file      Current Outpatient Medications:  .  aspirin 81 MG chewable tablet, Chew by mouth., Disp: , Rfl:  .  busPIRone (BUSPAR) 10 MG tablet, Take 1 tablet (10 mg total) by mouth 3 (three) times daily., Disp: 90 tablet, Rfl: 3 .  Cholecalciferol 50 MCG (2000 UT) CAPS, Take 1 capsule by mouth daily., Disp: , Rfl:  .  diclofenac (VOLTAREN) 75 MG EC tablet, Take by mouth., Disp: , Rfl:  .  EPINEPHrine 0.3 mg/0.3 mL IJ SOAJ injection, Inject 0.3 mg into the muscle as needed for anaphylaxis., Disp: 1 each, Rfl: 0 .  escitalopram (LEXAPRO) 20 MG tablet, Take by mouth., Disp: , Rfl:  .  folic acid (FOLVITE) 1 MG tablet, Take 1 tablet (1 mg total) by mouth daily., Disp: 90 tablet, Rfl: 1 .  lisinopril (ZESTRIL) 10 MG tablet, Take 1 tablet (10 mg total) by mouth daily., Disp: 90 tablet, Rfl: 3 .  Magnesium Oxide 400 (240 Mg) MG TABS, Take 240 mg by mouth., Disp: , Rfl:  .  mirtazapine (REMERON) 15 MG tablet, Take by mouth., Disp: , Rfl:  .  Multiple Vitamins-Minerals (YOUR LIFE MULTI ADULT GUMMIES) CHEW, Chew by mouth., Disp: , Rfl:  .  nicotine (NICODERM CQ - DOSED IN MG/24 HOURS) 21 mg/24hr patch, APPLY 1 PATCH TO SKIN ONCE A DAY *APPLY TO NON-HAIRY, CLEAN, DRY AREA (NO TOBACCO PRODUCTS)*, Disp: , Rfl:  .  pantoprazole (PROTONIX) 40 MG tablet, Take 1 tablet by mouth daily., Disp: , Rfl:  .  predniSONE (STERAPRED UNI-PAK 21 TAB) 10 MG (21) TBPK tablet, Taper as directed on packaging., Disp: 21 tablet, Rfl: 0 .  saccharomyces boulardii (FLORASTOR) 250 MG capsule, Take 250 mg by mouth 2 (two) times daily. , Disp: , Rfl:  .  thiamine 100 MG tablet, TAKE ONE TABLET BY MOUTH DAILY FOR DEFICIENCY., Disp: , Rfl:   EXAM:  VITALS per patient if applicable: BP (!) 710/626   Pulse 90   Wt 168 lb (76.2 kg)   BMI 27.96 kg/m   GENERAL: alert, oriented, appears well and in no acute distress  HEENT: atraumatic, conjunttiva clear, no obvious abnormalities on inspection of external nose and ears  NECK: normal  movements of the head and neck  LUNGS: on inspection no signs of respiratory distress, breathing rate appears normal, no obvious gross SOB, gasping or wheezing  CV: no obvious cyanosis  MS: moves all visible extremities without noticeable abnormality  PSYCH/NEURO: pleasant and cooperative, no obvious depression or anxiety, speech and thought processing grossly intact  ASSESSMENT AND PLAN:  Discussed the following assessment and plan:  Acute pain of right shoulder Some improvement with prednisone but continues to have radicular symptoms.  NCV/EMG ordered He may see Dr. Dianah Field for this as well.   GAD (generalized anxiety disorder) He sees psychiatry through the [place] however has had trouble getting buspar refill.  Renewal entered for this.    Allergic reaction Had to use epipen recently.  no continued symptoms at this time.  Discussed recommendations for ER f/u if he has to use epi-pen in the future.  Epi pen renewed.       Orders Placed This Encounter  Procedures  . NCV with EMG(electromyography)    Standing Status:   Future    Standing Expiration Date:   07/08/2021   Meds ordered this encounter  Medications  . busPIRone (BUSPAR) 10 MG tablet    Sig: Take 1 tablet (10 mg total) by mouth 3 (three) times daily.    Dispense:  90 tablet    Refill:  3  . EPINEPHrine 0.3 mg/0.3 mL IJ SOAJ injection    Sig: Inject 0.3 mg into the muscle as needed for anaphylaxis.    Dispense:  1 each    Refill:  0    I discussed the assessment and treatment plan with the patient. The patient was provided an opportunity to ask questions and all were answered. The patient agreed with the plan and demonstrated an understanding of the instructions.   The patient was advised to call back or seek an in-person evaluation if the symptoms worsen or if the condition fails to improve as anticipated.    [name], DO

## 2020-07-08 NOTE — Progress Notes (Signed)
Pt states doing well on the medication.  Needs Buspar refill.

## 2020-07-08 NOTE — Assessment & Plan Note (Signed)
Had to use epipen recently.  no continued symptoms at this time.  Discussed recommendations for ER f/u if he has to use epi-pen in the future.  Epi pen renewed.

## 2020-07-08 NOTE — Assessment & Plan Note (Signed)
He sees psychiatry through the [place] however has had trouble getting buspar refill.  Renewal entered for this.

## 2020-07-08 NOTE — Assessment & Plan Note (Signed)
Some improvement with prednisone but continues to have radicular symptoms.  NCV/EMG ordered He may see Dr. Dianah Field for this as well.

## 2020-07-09 ENCOUNTER — Encounter: Payer: Self-pay | Admitting: Family Medicine

## 2020-07-10 ENCOUNTER — Other Ambulatory Visit: Payer: Self-pay | Admitting: Family Medicine

## 2020-07-10 DIAGNOSIS — F4312 Post-traumatic stress disorder, chronic: Secondary | ICD-10-CM

## 2020-07-10 DIAGNOSIS — F411 Generalized anxiety disorder: Secondary | ICD-10-CM

## 2020-07-10 DIAGNOSIS — G709 Myoneural disorder, unspecified: Secondary | ICD-10-CM

## 2020-07-10 MED ORDER — BUSPIRONE HCL 10 MG PO TABS: 20.0000 mg | ORAL_TABLET | Freq: Three times a day (TID) | ORAL | 1 refills | Status: DC

## 2020-07-11 ENCOUNTER — Other Ambulatory Visit: Payer: Self-pay

## 2020-07-11 ENCOUNTER — Ambulatory Visit (INDEPENDENT_AMBULATORY_CARE_PROVIDER_SITE_OTHER): Payer: 59

## 2020-07-11 ENCOUNTER — Ambulatory Visit: Payer: 59 | Admitting: Sports Medicine

## 2020-07-11 ENCOUNTER — Encounter: Payer: Self-pay | Admitting: Sports Medicine

## 2020-07-11 DIAGNOSIS — M1812 Unilateral primary osteoarthritis of first carpometacarpal joint, left hand: Secondary | ICD-10-CM | POA: Diagnosis not present

## 2020-07-11 DIAGNOSIS — G729 Myopathy, unspecified: Secondary | ICD-10-CM | POA: Diagnosis not present

## 2020-07-11 DIAGNOSIS — M7541 Impingement syndrome of right shoulder: Secondary | ICD-10-CM | POA: Diagnosis not present

## 2020-07-11 HISTORY — DX: Unilateral primary osteoarthritis of first carpometacarpal joint, left hand: M18.12

## 2020-07-11 NOTE — Assessment & Plan Note (Signed)
Injected as above, x-rays, formal PT. Thumb loop can be ordered from [facility]. Return to see me in 6 weeks for this.

## 2020-07-11 NOTE — Progress Notes (Signed)
    Procedures performed today:    Procedure: Real-time Ultrasound Guided injection of the right subacromial bursa Device: Samsung HS60  Verbal informed consent obtained.  Time-out conducted.  Noted no overlying erythema, induration, or other signs of local infection.  Skin prepped in a sterile fashion.  Local anesthesia: Topical Ethyl chloride.  With sterile technique and under real time ultrasound guidance: 1 cc Kenalog 40, 1 cc lidocaine, 1 cc bupivacaine injected easily Completed without difficulty  Advised to call if fevers/chills, erythema, induration, drainage, or persistent bleeding.  Images permanently stored and available for review in PACS.  Impression: Technically successful ultrasound guided injection.  Procedure: Real-time Ultrasound Guided injection of the left first [facility] Device: Samsung HS60  Verbal informed consent obtained.  Time-out conducted.  Noted no overlying erythema, induration, or other signs of local infection.  Skin prepped in a sterile fashion.  Local anesthesia: Topical Ethyl chloride.  With sterile technique and under real time ultrasound guidance:  Noted dorsal branch of the radial artery overlying the joint space, this was seen and avoided, 1/2 cc lidocaine, 1/2 cc kenalog 40 injected easily  completed without difficulty  Advised to call if fevers/chills, erythema, induration, drainage, or persistent bleeding.  Images permanently stored and available for review in PACS.  Impression: Technically successful ultrasound guided injection.  Independent interpretation of notes and tests performed by another provider:   None.  Brief History, Exam, Impression, and Recommendations:    Impingement syndrome, shoulder, right This is a pleasant 54 year old male, he has a long history of bilateral shoulder pain, he has had operations on both shoulders including a SLAP repair on the right. He does have a recent MRI done at the [place] that he will bring next  time. More recently he has had pain over the deltoid worse with abduction, impingement signs on exam, adding formal physical therapy, subacromial injection, return to see me in 6 weeks for this.  Primary osteoarthritis of first carpometacarpal joint of one hand, left Injected as above, x-rays, formal PT. Thumb loop can be ordered from [facility]. Return to see me in 6 weeks for this.  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 Currently followed by neurology. Muscle biopsy done 12/29/2018 at [facility] was completely unremarkable. He is having some weakness in the arms and legs, I would make sure that were not dealing with a myelopathic process, we can discuss this at the follow-up visit. He did have a relatively normal cervical spine MRI, lumbar spine has not been done.    ___________________________________________ Gwen Her. Dianah Field, M.D., ABFM., CAQSM. Primary Care and [place] Instructor of [place] [employer] [facility] [employer]

## 2020-07-11 NOTE — Assessment & Plan Note (Addendum)
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 Currently followed by neurology. Muscle biopsy done 12/29/2018 at [facility] was completely unremarkable. He is having some weakness in the arms and legs, I would make sure that were not dealing with a myelopathic process, we can discuss this at the follow-up visit. He did have a relatively normal cervical spine MRI, lumbar spine has not been done.

## 2020-07-11 NOTE — Assessment & Plan Note (Signed)
This is a pleasant 54 year old male, he has a long history of bilateral shoulder pain, he has had operations on both shoulders including a SLAP repair on the right. He does have a recent MRI done at the [place] that he will bring next time. More recently he has had pain over the deltoid worse with abduction, impingement signs on exam, adding formal physical therapy, subacromial injection, return to see me in 6 weeks for this.

## 2020-07-16 ENCOUNTER — Emergency Department (HOSPITAL_BASED_OUTPATIENT_CLINIC_OR_DEPARTMENT_OTHER)
Admission: EM | Admit: 2020-07-16 | Discharge: 2020-07-16 | Disposition: A | Discharge status: Home / Self Care | Payer: 59 | Attending: Emergency Medicine | Admitting: Emergency Medicine

## 2020-07-16 ENCOUNTER — Other Ambulatory Visit: Payer: Self-pay

## 2020-07-16 ENCOUNTER — Emergency Department (HOSPITAL_BASED_OUTPATIENT_CLINIC_OR_DEPARTMENT_OTHER): Payer: 59

## 2020-07-16 ENCOUNTER — Encounter (HOSPITAL_BASED_OUTPATIENT_CLINIC_OR_DEPARTMENT_OTHER): Payer: Self-pay

## 2020-07-16 DIAGNOSIS — M7989 Other specified soft tissue disorders: Secondary | ICD-10-CM | POA: Diagnosis not present

## 2020-07-16 DIAGNOSIS — R200 Anesthesia of skin: Secondary | ICD-10-CM | POA: Diagnosis not present

## 2020-07-16 DIAGNOSIS — Z7982 Long term (current) use of aspirin: Secondary | ICD-10-CM | POA: Diagnosis not present

## 2020-07-16 DIAGNOSIS — M79601 Pain in right arm: Secondary | ICD-10-CM | POA: Diagnosis not present

## 2020-07-16 DIAGNOSIS — Z91010 Allergy to peanuts: Secondary | ICD-10-CM | POA: Insufficient documentation

## 2020-07-16 DIAGNOSIS — I10 Essential (primary) hypertension: Secondary | ICD-10-CM | POA: Diagnosis not present

## 2020-07-16 DIAGNOSIS — Z79899 Other long term (current) drug therapy: Secondary | ICD-10-CM | POA: Insufficient documentation

## 2020-07-16 DIAGNOSIS — Y93K9 Activity, other involving animal care: Secondary | ICD-10-CM | POA: Diagnosis not present

## 2020-07-16 DIAGNOSIS — W19XXXA Unspecified fall, initial encounter: Secondary | ICD-10-CM

## 2020-07-16 DIAGNOSIS — W010XXA Fall on same level from slipping, tripping and stumbling without subsequent striking against object, initial encounter: Secondary | ICD-10-CM | POA: Diagnosis not present

## 2020-07-16 DIAGNOSIS — M25521 Pain in right elbow: Secondary | ICD-10-CM | POA: Diagnosis not present

## 2020-07-16 DIAGNOSIS — M25511 Pain in right shoulder: Secondary | ICD-10-CM | POA: Insufficient documentation

## 2020-07-16 DIAGNOSIS — S4991XA Unspecified injury of right shoulder and upper arm, initial encounter: Secondary | ICD-10-CM | POA: Diagnosis not present

## 2020-07-16 DIAGNOSIS — R202 Paresthesia of skin: Secondary | ICD-10-CM | POA: Diagnosis not present

## 2020-07-16 MED ORDER — HYDROCODONE-ACETAMINOPHEN 5-325 MG PO TABS
1.0000 | ORAL_TABLET | Freq: Once | ORAL | Status: AC
  Administered 2020-07-16: 1 via ORAL
  Filled 2020-07-16: qty 1

## 2020-07-16 NOTE — ED Notes (Signed)
ED Provider at bedside. 

## 2020-07-16 NOTE — ED Triage Notes (Addendum)
Pt states he fell today-pain to right shoulder and arm-states he "felt like my shoulder popped out"-NAD-steady gait

## 2020-07-16 NOTE — ED Notes (Signed)
Pt calling for ride

## 2020-07-16 NOTE — Discharge Instructions (Signed)
You can take Tylenol or Ibuprofen as directed for pain. You can alternate Tylenol and Ibuprofen every 4 hours. If you take Tylenol at 1pm, then you can take Ibuprofen at 5pm. Then you can take Tylenol again at 9pm.   Follow the RICE (Rest, Ice, Compression, Elevation) protocol as directed.   Wear the sling for support and stabilization.   Follow up with the referred orthopedic doctor.  Return to the Emergency Dept for any worsening pain, numbness/weakness or any other worsening or concerning symptoms.

## 2020-07-16 NOTE — ED Provider Notes (Signed)
[facility] EMERGENCY DEPARTMENT Provider Note   CSN: [id number] Arrival date & time: 07/16/20  1747     History Chief Complaint  Patient presents with  . Fall    Juan [name] is a 54 y.o. male past medical history of alcohol addiction, anxiety, depression, hypertension who presents for evaluation of right shoulder, right arm pain after mechanical fall.  He reports that about 3 PM this afternoon, he was in his backyard playing with the dog and states that he tripped and fell, landing on his right arm.  He states since then, he has had pain.  He states it is most probably in his right shoulder but radiates down his arm.  He does feel like he has some pain noted to the elbow and wrist.  He states it hurts when he tries to move it.  He denies any numbness/weakness.  The history is provided by the patient.       Past Medical History:  Diagnosis Date  . Alcohol addiction ([place])   . Anxiety   . Colon polyps   . Depression   . Hemochromatosis   . Hx of blood clots    Leg  . Hypertension     Patient Active Problem List   Diagnosis Date Noted  . Primary osteoarthritis of first carpometacarpal joint of one hand, left 07/11/2020  . Allergic reaction 07/08/2020  . History of alcohol abuse 05/20/2020  . Impingement syndrome, shoulder, right 05/20/2020  . Fracture of laryngeal cartilage ([place]) 01/17/2020  . Esophagitis, [place] grade D 12/05/2019  . Chronic alcoholic liver disease ([place]) 09/30/2019  . GAD (generalized anxiety disorder) 04/16/2019  . Chronic post-traumatic stress disorder (PTSD) 04/16/2019  . MDD (major depressive disorder), recurrent severe, without psychosis ([place]) 04/15/2019  . History of bilateral inguinal hernia repair 01/19/2019  . 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 Laceration of extensor hallucis longus tendon, left, initial encounter 02/06/2017  . Elevated liver enzymes 01/07/2017  . Chronic fatigue 12/21/2016  . Benign essential hypertension 12/21/2016     Past Surgical History:  Procedure Laterality Date  . FRACTURE SURGERY    . HERNIA REPAIR    . VASECTOMY         No family history on file.  Social History   Tobacco Use  . Smoking status: Never Smoker  . Smokeless tobacco: Current User    Types: Snuff  Vaping Use  . Vaping Use: Never used  Substance Use Topics  . Alcohol use: Not Currently  . Drug use: Never    Home Medications Prior to Admission medications   Medication Sig Start Date End Date Taking? Authorizing Provider  aspirin 81 MG chewable tablet Chew by mouth.    [provider]  busPIRone (BUSPAR) 10 MG tablet Take 2 tablets (20 mg total) by mouth 3 (three) times daily. 07/10/20 10/08/20  [name], DO  Cholecalciferol 50 MCG (2000 UT) CAPS Take 1 capsule by mouth daily.    [provider]  diclofenac (VOLTAREN) 75 MG EC tablet Take by mouth. Patient not taking: Reported on 07/11/2020 10/15/19   [provider]  EPINEPHrine 0.3 mg/0.3 mL IJ SOAJ injection Inject 0.3 mg into the muscle as needed for anaphylaxis. 07/08/20   [name], DO  escitalopram (LEXAPRO) 20 MG tablet Take by mouth. 03/07/20   [provider]  folic acid (FOLVITE) 1 MG tablet Take 1 tablet (1 mg total) by mouth daily. 05/29/20   [name], DO  lisinopril (ZESTRIL) 10 MG tablet Take 1  tablet (10 mg total) by mouth daily. 05/29/20 05/29/21  [name], DO  Magnesium Oxide 400 (240 Mg) MG TABS Take 240 mg by mouth.    [provider]  mirtazapine (REMERON) 15 MG tablet Take by mouth. 03/07/20   [provider]  Multiple Vitamins-Minerals (YOUR LIFE [place]) [place] by mouth.    [provider]  nicotine (NICODERM CQ - DOSED IN MG/24 HOURS) 21 mg/24hr patch APPLY 1 PATCH TO SKIN ONCE A DAY *APPLY TO NON-HAIRY, CLEAN, DRY AREA (NO TOBACCO PRODUCTS)* Patient not taking: Reported on 07/11/2020 11/14/19   [provider]  pantoprazole (PROTONIX) 40 MG  tablet Take 1 tablet by mouth daily. 02/12/20 02/11/21  [provider]  predniSONE (STERAPRED UNI-PAK 21 TAB) 10 MG (21) TBPK tablet Taper as directed on packaging. Patient not taking: Reported on 07/11/2020 05/20/20   [name], DO  saccharomyces boulardii (FLORASTOR) 250 MG capsule Take 250 mg by mouth 2 (two) times daily.     [provider]  thiamine 100 MG tablet TAKE ONE TABLET BY MOUTH DAILY FOR DEFICIENCY. 11/14/19   [provider]    Allergies    Charentais melon (french melon), Cucumber extract, Other, Peanut butter flavor, Peanut oil, Shellfish allergy, Cantaloupe extract allergy skin test, Lactose, Strawberry extract, Vancomycin, Codeine, and Depakote er [divalproex sodium er]  Review of Systems   Review of Systems  Musculoskeletal:       Right shoulder pain  Neurological: Negative for weakness and numbness.  All other systems reviewed and are negative.   Physical Exam Updated Vital Signs BP 127/74   Pulse 78   Temp 98 F (36.7 C)   Resp 18   Ht 5' 5"  (1.651 m)   Wt 79.4 kg   SpO2 99%   BMI 29.12 kg/m   Physical Exam Vitals and nursing note reviewed.  Constitutional:      Appearance: He is well-developed.  HENT:     Head: Normocephalic and atraumatic.  Eyes:     General: No scleral icterus.       Right eye: No discharge.        Left eye: No discharge.     Conjunctiva/sclera: Conjunctivae normal.  Cardiovascular:     Pulses:          Radial pulses are 2+ on the right side and 2+ on the left side.  Pulmonary:     Effort: Pulmonary effort is normal.  Musculoskeletal:     Comments: Tenderness palpation noted to the right shoulder.  No deformity crepitus noted.  Limited range of motion though he can flex and extend.  Mild tenderness noted to the ulnar aspect of the right elbow.  No deformity or crepitus noted.  Flexing/tension of elbow intact.  Tenderness palpation noted diffusely to the right wrist.  No snuffbox tenderness.   Flexion/tension intact.  He can flex and extend all five digits and easily make a fist.  Skin:    General: Skin is warm and dry.     Comments: Good distal cap refill.   Neurological:     Mental Status: He is alert.     Comments: Sensation intact along major nerve distributions of BUE  Psychiatric:        Speech: Speech normal.        Behavior: Behavior normal.     ED Results / Procedures / Treatments   Labs (all labs ordered are listed, but only abnormal results are displayed) Labs Reviewed - No  data to display  EKG None  Radiology DG Shoulder Right  Result Date: 07/16/2020 CLINICAL DATA:  Injury, fall on RIGHT shoulder when going downhill. EXAM: RIGHT SHOULDER - 2+ VIEW COMPARISON:  May 20, 2020 FINDINGS: There is no evidence of fracture or dislocation. There is no evidence of arthropathy or other focal bone abnormality. Soft tissues are unremarkable. IMPRESSION: Negative evaluation of the RIGHT shoulder. Electronically Signed   By: Zetta Bills M.D.   On: 07/16/2020 19:10   DG Elbow Complete Right  Result Date: 07/16/2020 CLINICAL DATA:  Fall, fall backwards and landed on RIGHT shoulder with elbow pain numbness and tingling in the RIGHT upper extremity. EXAM: RIGHT ELBOW - COMPLETE 3+ VIEW COMPARISON:  None FINDINGS: There is no evidence of fracture, dislocation, or joint effusion. There is no evidence of arthropathy or other focal bone abnormality. Soft tissues are unremarkable. IMPRESSION: Negative evaluation of the RIGHT elbow. Electronically Signed   By: Zetta Bills M.D.   On: 07/16/2020 19:13   DG Wrist Complete Right  Result Date: 07/16/2020 CLINICAL DATA:  Fall, landed on RIGHT shoulder and elbow with pain and numbness and tingling and RIGHT upper extremity. EXAM: RIGHT WRIST - COMPLETE 3+ VIEW COMPARISON:  None FINDINGS: There is no evidence of fracture or dislocation. There is no evidence of arthropathy or other focal bone abnormality. Mild soft tissue  swelling suggested about the RIGHT wrist and hand over the volar aspect. IMPRESSION: No acute fracture or dislocation. Mild soft tissue swelling about the RIGHT wrist and hand. Electronically Signed   By: Zetta Bills M.D.   On: 07/16/2020 19:12    Procedures Procedures (including critical care time)  Medications Ordered in ED Medications  HYDROcodone-acetaminophen (NORCO/VICODIN) 5-325 MG per tablet 1 tablet (1 tablet Oral Given 07/16/20 1923)    ED Course  I have reviewed the triage vital signs and the nursing notes.  Pertinent labs & imaging results that were available during my care of the patient were reviewed by me and considered in my medical decision making (see chart for details).    MDM Rules/Calculators/A&P                           54 year old male who presents for evaluation of right shoulder pain after mechanical fall that occurred this evening.  Reports he landed on the right shoulder.  No numbness/weakness.  On initially arrival, he is afebrile, nontoxic-appearing.  Vital signs are stable.  He is neurovascularly intact.  No deformity or crepitus noted.  He also has some pain noted to the elbow and wrist.  Concern for sprain versus fracture versus dislocation.  X-rays ordered at triage.  We will add x-ray of elbow and wrist.  X-ray shoulder negative for any acute bony abnormality.  X-ray of wrist negative for any acute bony abnormality.  Elbow x-ray negative for any acute bony abnormality.  Discussed results with patient.  We did discuss that there could still still be a MSK injury such as ligament sprain.  We will plan to put her in a sling for immobilization.  Patient instructed follow-up with Ortho. At this time, patient exhibits no emergent life-threatening condition that require further evaluation in ED. Patient had ample opportunity for questions and discussion. All patient's questions were answered with full understanding. Strict return precautions discussed. Patient  expresses understanding and agreement to plan.   Portions of this note were generated with [employer]. Dictation errors may occur despite best  attempts at proofreading.   Final Clinical Impression(s) / ED Diagnoses Final diagnoses:  Fall, initial encounter  Right arm pain    Rx / DC Orders ED Discharge Orders    None       Volanda Napoleon, PA-C 07/18/20 [id number]    Gareth Morgan, MD 07/25/20 0040

## 2020-07-21 MED FILL — busPIRone HCL 10 MG TABS: 10 | 90 days supply | Qty: 540 | Fill #0

## 2020-07-30 DIAGNOSIS — G7100 Muscular dystrophy, unspecified: Secondary | ICD-10-CM | POA: Diagnosis not present

## 2020-07-30 DIAGNOSIS — R262 Difficulty in walking, not elsewhere classified: Secondary | ICD-10-CM | POA: Diagnosis not present

## 2020-08-01 ENCOUNTER — Encounter: Payer: Self-pay | Admitting: Physical Therapy

## 2020-08-01 ENCOUNTER — Ambulatory Visit (INDEPENDENT_AMBULATORY_CARE_PROVIDER_SITE_OTHER): Payer: 59 | Admitting: Physical Therapy

## 2020-08-01 DIAGNOSIS — M25511 Pain in right shoulder: Secondary | ICD-10-CM | POA: Diagnosis not present

## 2020-08-01 DIAGNOSIS — M6281 Muscle weakness (generalized): Secondary | ICD-10-CM

## 2020-08-01 NOTE — Therapy (Signed)
[place], [place], [address] Phone: [phone]   Fax:  [phone]  Physical Therapy Evaluation  Patient Details  Name: Juan [name] MRN: [id number] Date of Birth: 08-Nov-1965 Referring Provider (PT): thekkekandam   Encounter Date: 08/01/2020   PT End of Session - 08/01/20 0923    Visit Number 1    Number of Visits 12    Date for PT Re-Evaluation 09/11/20    PT Start Time 0805    PT Stop Time 0850    PT Time Calculation (min) 45 min    Activity Tolerance Patient tolerated treatment well    Behavior During Therapy [facility] for tasks assessed/performed           Past Medical History:  Diagnosis Date  . Alcohol addiction ([place])   . Anxiety   . Colon polyps   . Depression   . Hemochromatosis   . Hx of blood clots    Leg  . Hypertension     Past Surgical History:  Procedure Laterality Date  . FRACTURE SURGERY    . HERNIA REPAIR    . VASECTOMY      There were no vitals filed for this visit.    Subjective Assessment - 08/01/20 0831    Subjective Pt was playing with his dog 4 weeks ago and fell on his Right shoulder.  Imaging is negative for fracture. pt had injection 2 weeks ago which helped for "few days".  Pt has been using ice and heat and stretches from MD but continues with pain.    Pertinent History SLAP tear Rt shoulder 2018, SLAP Lt shoulder 2017, cervial herniated discs C3-C5    Diagnostic tests x ray negative for fracture    Patient Stated Goals move my arm without pain    Currently in Pain? Yes    Pain Score 2     Pain Location Shoulder    Pain Orientation Right    Pain Descriptors / Indicators Aching    Pain Type Acute pain;Chronic pain    Pain Onset More than a month ago    Pain Frequency Intermittent    Aggravating Factors  certain movements    Pain Relieving Factors positioning on a pillow during sleep    Effect of Pain on Daily Activities unable to lift or reach  with Rt UE for IADLs              [facility] PT Assessment - 08/01/20 0001      Assessment   Medical Diagnosis Rt shoulder impingement    Referring Provider (PT) [email or web address]    Onset Date/Surgical Date 07/11/20      Precautions   Precaution Comments pt has bioness on Lt LE for drop foot      Balance Screen   Has the patient fallen in the past 6 months Yes   mulitple   How many times? many    Is the patient reluctant to leave their home because of a fear of falling?  No      Observation/Other Assessments   Observations rounded shoulders, forward head      Sensation   Light Touch Impaired Detail    Light Touch Impaired Details Impaired RUE   ulnar nerve tingling     Posture/Postural Control   Posture Comments forward head, rounded shoulders      ROM / Strength   AROM / PROM / Strength AROM;Strength      AROM   AROM Assessment Site  Shoulder    Right/Left Shoulder Right    Right Shoulder Flexion 130 Degrees    Right Shoulder ABduction 61 Degrees    Right Shoulder Internal Rotation 80 Degrees    Right Shoulder External Rotation 80 Degrees      Strength   Strength Assessment Site Shoulder;Elbow    Right/Left Shoulder Right;Left    Right Shoulder Flexion 3/5    Right Shoulder ABduction 3-/5    Left Shoulder Flexion 4/5    Left Shoulder ABduction 4/5    Right/Left Elbow Right;Left    Right Elbow Flexion 5/5    Right Elbow Extension 5/5    Left Elbow Flexion 5/5    Left Elbow Extension 5/5      Palpation   Palpation comment Jt mobility WFL. TTP RT anterior shoulder down bicipital groove      Special Tests    Special Tests Rotator Cuff Impingement    Rotator Cuff Impingment tests Empty Can test;Full Can test      Empty Can test   Findings Positive    Side Right      Full Can test   Findings Positive    Side Right                      Objective measurements completed on examination: See above findings.       [facility] PT Treatment/Exercise  - 08/01/20 0001      Exercises   Exercises Shoulder      Shoulder Exercises: Sidelying   External Rotation AROM;Strengthening;Right;10 reps      Shoulder Exercises: Standing   Retraction AROM;Strengthening;Both;10 reps      Modalities   Modalities Electrical Stimulation;Cryotherapy      Cryotherapy   Number Minutes Cryotherapy 10 Minutes    Cryotherapy Location Shoulder    Type of Cryotherapy Ice pack      Electrical Stimulation   Electrical Stimulation Location Rt shoulder    Electrical Stimulation Action modulated    Electrical Stimulation Parameters to tolerance    Electrical Stimulation Goals Pain                  PT Education - 08/01/20 0922    Education Details HEP, PT POC and goals    Person(s) Educated Patient    Methods Explanation;Demonstration;Handout    Comprehension Returned demonstration;Verbalized understanding               PT Long Term Goals - 08/01/20 0950      PT LONG TERM GOAL #1   Title Pt will be independent with HEP to improve posture and strength    Time 6    Period Weeks    Status New    Target Date 09/12/20      PT LONG TERM GOAL #2   Title Pt will increase Rt shoulder strength to 4/5 to improve ability to perform IADLs without pain    Time 6    Period Weeks    Status New    Target Date 09/12/20      PT LONG TERM GOAL #3   Title Pt will improve Rt shoulder horizontal AROM to [facility] to assist with IADLs    Time 6    Period Weeks    Status New    Target Date 09/12/20                  Plan - 08/01/20 0947    Personal Factors and Comorbidities Comorbidity 3+  Comorbidities balance deficits, cervical herniated discs, alcoholism    Examination-Activity Limitations Carry;Reach Overhead    Stability/Clinical Decision Making Evolving/Moderate complexity    Clinical Decision Making Moderate    Rehab Potential Good    PT Frequency 2x / week    PT Duration 6 weeks    PT Treatment/Interventions ADLs/Self Care Home  Management;Cryotherapy;Electrical Stimulation;Iontophoresis 43m/ml Dexamethasone;Moist Heat;Therapeutic exercise;Therapeutic activities;Patient/family education;Manual techniques;Vasopneumatic Device;Taping;Dry needling    PT Next Visit Plan Assess HEP. progress postural exercises (rows, shoulder extension)    PT Home Exercise Plan [email or web address]    Consulted and Agree with Plan of Care Patient           Patient will benefit from skilled therapeutic intervention in order to improve the following deficits and impairments:  Decreased activity tolerance,Decreased strength,Pain,Impaired UE functional use,Postural dysfunction  Visit Diagnosis: Muscle weakness (generalized)  Acute pain of right shoulder     Problem List Patient Active Problem List   Diagnosis Date Noted  . Primary osteoarthritis of first carpometacarpal joint of one hand, left 07/11/2020  . Allergic reaction 07/08/2020  . History of alcohol abuse 05/20/2020  . Impingement syndrome, shoulder, right 05/20/2020  . Fracture of laryngeal cartilage ([place] 01/17/2020  . Esophagitis, [place] grade D 12/05/2019  . Chronic alcoholic liver disease ([place] 09/30/2019  . GAD (generalized anxiety disorder) 04/16/2019  . Chronic post-traumatic stress disorder (PTSD) 04/16/2019  . MDD (major depressive disorder), recurrent severe, without psychosis ([place] 04/15/2019  . History of bilateral inguinal hernia repair 01/19/2019  . 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 Laceration of extensor hallucis longus tendon, left, initial encounter 02/06/2017  . Elevated liver enzymes 01/07/2017  . Chronic fatigue 12/21/2016  . Benign essential hypertension 12/21/2016    Elmer Boutelle 08/01/2020, 10:02 AM  [facility] [address]Phone: [phone]  Fax:  [phone] Name: Juan CatenaMRN: [id number] of Birth: 2February 23, 1967

## 2020-08-01 NOTE — Patient Instructions (Signed)
Access Code: [email or web address] URL: [email or web address] Date: 12/10/2021Prepared by: [place]  Seated Scapular Retraction - 1 x daily - 7 x weekly - 1 sets - 10 reps - 5 seconds hold  Standing Scapular Depression - 1 x daily - 7 x weekly - 1 sets - 10 reps - 5 seconds hold  Sidelying Shoulder External Rotation - 1 x daily - 7 x weekly - 2 sets - 10 reps

## 2020-08-04 ENCOUNTER — Encounter: Payer: 59 | Admitting: Physical Therapy

## 2020-08-06 ENCOUNTER — Encounter: Payer: Self-pay | Admitting: Family Medicine

## 2020-08-06 DIAGNOSIS — F1021 Alcohol dependence, in remission: Secondary | ICD-10-CM | POA: Diagnosis not present

## 2020-08-06 DIAGNOSIS — F419 Anxiety disorder, unspecified: Secondary | ICD-10-CM | POA: Diagnosis not present

## 2020-08-06 DIAGNOSIS — G4700 Insomnia, unspecified: Secondary | ICD-10-CM | POA: Diagnosis not present

## 2020-08-07 ENCOUNTER — Encounter: Payer: Self-pay | Admitting: Family Medicine

## 2020-08-07 ENCOUNTER — Other Ambulatory Visit: Payer: Self-pay | Admitting: Family Medicine

## 2020-08-07 ENCOUNTER — Encounter: Payer: 59 | Admitting: Physical Therapy

## 2020-08-07 ENCOUNTER — Telehealth (INDEPENDENT_AMBULATORY_CARE_PROVIDER_SITE_OTHER): Payer: 59 | Admitting: Family Medicine

## 2020-08-07 DIAGNOSIS — U071 COVID-19: Secondary | ICD-10-CM | POA: Insufficient documentation

## 2020-08-07 MED ORDER — BENZONATATE 200 MG PO CAPS: 200.0000 mg | ORAL_CAPSULE | Freq: Three times a day (TID) | ORAL | 0 refills | Status: DC | PRN

## 2020-08-07 MED FILL — BENZONATATE 200 MG CAPS: 200 | 10 days supply | Qty: 30 | Fill #0

## 2020-08-07 NOTE — Assessment & Plan Note (Signed)
He tested positive on a rapid home test last night.  His symptoms are consistent with COVID-19 infection and he did have close exposure recently. Recommend that he quarantine for period of at least 10 days and have improving symptoms prior to leaving quarantine. He is instructed to increase his fluid intake, rest and continue over-the-counter symptomatic treatment. I will add Tessalon Perles as needed for cough. He is interested in monoclonal antibody infusion, I forwarded his name to our infusion clinic and they will contact him for screening. He is instructed to contact the clinic if he develops worsening symptoms.

## 2020-08-07 NOTE — Progress Notes (Signed)
Juan [name] - 54 y.o. male MRN [id number]  Date of birth: 16-Apr-1966   This visit type was conducted due to national recommendations for restrictions regarding the COVID-19 Pandemic (e.g. social distancing).  This format is felt to be most appropriate for this patient at this time.  All issues noted in this document were discussed and addressed.  No physical exam was performed (except for noted visual exam findings with Video Visits).  I discussed the limitations of evaluation and management by telemedicine and the availability of in person appointments. The patient expressed understanding and agreed to proceed.  I connected with@ on 08/07/20 at 11:10 AM EST by a video enabled telemedicine application and verified that I am speaking with the correct person using two identifiers.  Present at visit: Juan Nutting, DO Juan [name]   Patient Location[place] [address]   Provider location:   [place]  No chief complaint on file.   HPI  Juan [name] is a 54 y.o. male who presents via audio/video conferencing for a telehealth visit today.  He reports that he tested positive for COVID-19 yesterday via a home rapid test.  Symptoms began today prior.  He was exposed to another individual who was Covid positive at a funeral over the weekend.  Current symptoms include congestion, cough and body aches.  He denies shortness of breath or difficulty breathing.  He has not had any nausea vomiting or diarrhea.  He is using over-the-counter cold and flu medication with some improvement.  He is drinking plenty of fluids.  He has had vaccination however has not had a booster yet.     ROS:  A comprehensive ROS was completed and negative except as noted per HPI  Past Medical History:  Diagnosis Date  . Alcohol addiction ([place])   . Anxiety   . Colon polyps   . Depression   . Hemochromatosis   . Hx of blood clots    Leg  . Hypertension     Past Surgical History:   Procedure Laterality Date  . FRACTURE SURGERY    . HERNIA REPAIR    . VASECTOMY      No family history on file.  Social History   Socioeconomic History  . Marital status: Married    Spouse name: Not on file  . Number of children: Not on file  . Years of education: Not on file  . Highest education level: Not on file  Occupational History  . Not on file  Tobacco Use  . Smoking status: Never Smoker  . Smokeless tobacco: Current User    Types: Snuff  Vaping Use  . Vaping Use: Never used  Substance and Sexual Activity  . Alcohol use: Not Currently  . Drug use: Never  . Sexual activity: Not on file  Other Topics Concern  . Not on file  Social History Narrative  . Not on file   Social Determinants of Health   Financial Resource Strain: Not on file  Food Insecurity: Not on file  Transportation Needs: Not on file  Physical Activity: Not on file  Stress: Not on file  Social Connections: Not on file  Intimate Partner Violence: Not on file     Current Outpatient Medications:  .  aspirin 81 MG chewable tablet, Chew by mouth., Disp: , Rfl:  .  benzonatate (TESSALON) 200 MG capsule, Take 1 capsule (200 mg total) by mouth 3 (three) times daily as needed for cough., Disp: 30 capsule, Rfl: 0 .  busPIRone (BUSPAR) 10 MG tablet, Take 2 tablets (20 mg total) by mouth 3 (three) times daily., Disp: 540 tablet, Rfl: 1 .  Cholecalciferol 50 MCG (2000 UT) CAPS, Take 1 capsule by mouth daily., Disp: , Rfl:  .  diclofenac (VOLTAREN) 75 MG EC tablet, Take by mouth. (Patient not taking: Reported on 07/11/2020), Disp: , Rfl:  .  EPINEPHrine 0.3 mg/0.3 mL IJ SOAJ injection, Inject 0.3 mg into the muscle as needed for anaphylaxis., Disp: 1 each, Rfl: 0 .  escitalopram (LEXAPRO) 20 MG tablet, Take by mouth., Disp: , Rfl:  .  folic acid (FOLVITE) 1 MG tablet, Take 1 tablet (1 mg total) by mouth daily., Disp: 90 tablet, Rfl: 1 .  lisinopril (ZESTRIL) 10 MG tablet, Take 1 tablet (10 mg total) by  mouth daily., Disp: 90 tablet, Rfl: 3 .  Magnesium Oxide 400 (240 Mg) MG TABS, Take 240 mg by mouth., Disp: , Rfl:  .  mirtazapine (REMERON) 15 MG tablet, Take by mouth., Disp: , Rfl:  .  Multiple Vitamins-Minerals (YOUR LIFE MULTI ADULT GUMMIES) CHEW, Chew by mouth., Disp: , Rfl:  .  nicotine (NICODERM CQ - DOSED IN MG/24 HOURS) 21 mg/24hr patch, APPLY 1 PATCH TO SKIN ONCE A DAY *APPLY TO NON-HAIRY, CLEAN, DRY AREA (NO TOBACCO PRODUCTS)* (Patient not taking: Reported on 07/11/2020), Disp: , Rfl:  .  pantoprazole (PROTONIX) 40 MG tablet, Take 1 tablet by mouth daily., Disp: , Rfl:  .  predniSONE (STERAPRED UNI-PAK 21 TAB) 10 MG (21) TBPK tablet, Taper as directed on packaging. (Patient not taking: Reported on 07/11/2020), Disp: 21 tablet, Rfl: 0 .  saccharomyces boulardii (FLORASTOR) 250 MG capsule, Take 250 mg by mouth 2 (two) times daily. , Disp: , Rfl:  .  thiamine 100 MG tablet, TAKE ONE TABLET BY MOUTH DAILY FOR DEFICIENCY., Disp: , Rfl:   EXAM:  VITALS per patient if applicable: There were no vitals taken for this visit.  GENERAL: alert, oriented, appears well and in no acute distress  HEENT: atraumatic, conjunttiva clear, no obvious abnormalities on inspection of external nose and ears  NECK: normal movements of the head and neck  LUNGS: Some coughing but no signs of respiratory distress, breathing rate appears normal, no obvious gross SOB, gasping or wheezing  CV: no obvious cyanosis  MS: moves all visible extremities without noticeable abnormality  PSYCH/NEURO: pleasant and cooperative, no obvious depression or anxiety, speech and thought processing grossly intact  ASSESSMENT AND PLAN:  Discussed the following assessment and plan:  COVID-19 He tested positive on a rapid home test last night.  His symptoms are consistent with COVID-19 infection and he did have close exposure recently. Recommend that he quarantine for period of at least 10 days and have improving symptoms  prior to leaving quarantine. He is instructed to increase his fluid intake, rest and continue over-the-counter symptomatic treatment. I will add Tessalon Perles as needed for cough. He is interested in monoclonal antibody infusion, I forwarded his name to our infusion clinic and they will contact him for screening. He is instructed to contact the clinic if he develops worsening symptoms.     I discussed the assessment and treatment plan with the patient. The patient was provided an opportunity to ask questions and all were answered. The patient agreed with the plan and demonstrated an understanding of the instructions.   The patient was advised to call back or seek an in-person evaluation if the symptoms worsen or if the condition fails to improve as  anticipated.    Juan Nutting, DO

## 2020-08-08 ENCOUNTER — Telehealth (HOSPITAL_COMMUNITY): Payer: Self-pay | Admitting: Family

## 2020-08-08 ENCOUNTER — Telehealth (HOSPITAL_COMMUNITY): Payer: Self-pay

## 2020-08-08 ENCOUNTER — Telehealth: Payer: Self-pay | Admitting: Nurse Practitioner

## 2020-08-08 ENCOUNTER — Telehealth: Payer: Self-pay | Admitting: *Deleted

## 2020-08-08 NOTE — Telephone Encounter (Signed)
Called to discuss with [name] about Covid symptoms and potential candidacy for the use of sotrovimab, a combination monoclonal antibody infusion for those with mild to moderate Covid symptoms and at a high risk of hospitalization.     Pt is qualified for this infusion at the infusion center due to co-morbid conditions and/or a member of an at-risk group, however unable to reach patient. VM left.   Montrice Gracey,NP

## 2020-08-08 NOTE — Telephone Encounter (Signed)
Called to Discuss with patient about Covid symptoms and the use of the monoclonal antibody infusion for those with mild to moderate Covid symptoms and at a high risk of hospitalization.     Pt appears to qualify for this infusion due to co-morbid conditions and/or a member of an at-risk group in accordance with the FDA Emergency Use Authorization.    Unable to reach pt. Voicemail left and My Chart message sent.   Alda Lea, NP [facility]  [phone]

## 2020-08-08 NOTE — Telephone Encounter (Signed)
Pt said he missed a call from my number and he was told to be at [facility] tomorrow for infusion but was not given a time. I do not see him scheduled for an appt. I will notify the hot line so they will know he is planning on coming and just waiting to hear what time his appt is. His number as well as his wife's number was given for call back.

## 2020-08-08 NOTE — Telephone Encounter (Signed)
Called to Discuss with patient about Covid symptoms and the use of the monoclonal antibody infusion for those with mild to moderate Covid symptoms and at a high risk of hospitalization.     Pt appears to qualify for this infusion due to co-morbid conditions and/or a member of an at-risk group in accordance with the FDA Emergency Use Authorization.    Pt stated his symptoms started in 12/15, he did a home test on the 12/16 which was positive, and complains of fever, body aches, cough, loss of taste/smell. Pt stated he is vaccinated x2 and has a history of HTN, taking lisinopril. Pt informed an APP will follow up to verify information and if he qualifies will set up and appointment.

## 2020-08-09 ENCOUNTER — Telehealth: Payer: Self-pay | Admitting: Physician Assistant

## 2020-08-09 ENCOUNTER — Other Ambulatory Visit: Payer: Self-pay | Admitting: Nurse Practitioner

## 2020-08-09 DIAGNOSIS — K709 Alcoholic liver disease, unspecified: Secondary | ICD-10-CM

## 2020-08-09 DIAGNOSIS — I10 Essential (primary) hypertension: Secondary | ICD-10-CM

## 2020-08-09 DIAGNOSIS — U071 COVID-19: Secondary | ICD-10-CM

## 2020-08-09 NOTE — Progress Notes (Signed)
I connected by phone with [name] on 08/09/2020 at 4:24 PM to discuss the potential use of a new treatment for mild to moderate COVID-19 viral infection in non-hospitalized patients.  This patient is a 54 y.o. male that meets the FDA criteria for Emergency Use Authorization of COVID monoclonal antibody casirivimab/imdevimab, bamlanivimab/etesevimab, or sotrovimab.  Has a (+) direct SARS-CoV-2 viral test result  Has mild or moderate COVID-19   Is NOT hospitalized due to COVID-19  Is within 10 days of symptom onset  Has at least one of the high risk factor(s) for progression to severe COVID-19 and/or hospitalization as defined in EUA.  Specific high risk criteria : BMI > 25 and Cardiovascular disease or hypertension   I have spoken and communicated the following to the patient or parent/caregiver regarding COVID monoclonal antibody treatment:  1. FDA has authorized the emergency use for the treatment of mild to moderate COVID-19 in adults and pediatric patients with positive results of direct SARS-CoV-2 viral testing who are 10 years of age and older weighing at least 40 kg, and who are at high risk for progressing to severe COVID-19 and/or hospitalization.  2. The significant known and potential risks and benefits of COVID monoclonal antibody, and the extent to which such potential risks and benefits are unknown.  3. Information on available alternative treatments and the risks and benefits of those alternatives, including clinical trials.  4. Patients treated with COVID monoclonal antibody should continue to self-isolate and use infection control measures (e.g., wear mask, isolate, social distance, avoid sharing personal items, clean and disinfect "high touch" surfaces, and frequent handwashing) according to CDC guidelines.   5. The patient or parent/caregiver has the option to accept or refuse COVID monoclonal antibody treatment.  After reviewing this information with the  patient, the patient has agreed to receive one of the available covid 19 monoclonal antibodies and will be provided an appropriate fact sheet prior to infusion. Jobe Gibbon, NP 08/09/2020 4:24 PM

## 2020-08-09 NOTE — Telephone Encounter (Signed)
Called to Discuss with patient about Covid symptoms and the use of the monoclonal antibody infusion for those with mild to moderate Covid symptoms and at a high risk of hospitalization.     Pt appears to qualify for this infusion due to co-morbid conditions and/or a member of an at-risk group in accordance with the FDA Emergency Use Authorization.    Unable to reach pt - phone rings straight to VM.   He tested positive with a rapid home test on 08/06/20 and qualifies with HTN and possibly BMI.

## 2020-08-11 ENCOUNTER — Ambulatory Visit (HOSPITAL_COMMUNITY)
Admission: RE | Admit: 2020-08-11 | Discharge: 2020-08-11 | Disposition: A | Discharge status: Home / Self Care | Payer: 59 | Source: Ambulatory Visit | Attending: Pulmonary Disease | Admitting: Pulmonary Disease

## 2020-08-11 ENCOUNTER — Encounter: Payer: 59 | Admitting: Physical Therapy

## 2020-08-11 DIAGNOSIS — F17290 Nicotine dependence, other tobacco product, uncomplicated: Secondary | ICD-10-CM | POA: Diagnosis not present

## 2020-08-11 DIAGNOSIS — E876 Hypokalemia: Secondary | ICD-10-CM | POA: Diagnosis not present

## 2020-08-11 DIAGNOSIS — Z888 Allergy status to other drugs, medicaments and biological substances status: Secondary | ICD-10-CM | POA: Diagnosis not present

## 2020-08-11 DIAGNOSIS — U071 COVID-19: Secondary | ICD-10-CM

## 2020-08-11 DIAGNOSIS — D696 Thrombocytopenia, unspecified: Secondary | ICD-10-CM | POA: Diagnosis not present

## 2020-08-11 DIAGNOSIS — K709 Alcoholic liver disease, unspecified: Secondary | ICD-10-CM | POA: Insufficient documentation

## 2020-08-11 DIAGNOSIS — I10 Essential (primary) hypertension: Secondary | ICD-10-CM | POA: Insufficient documentation

## 2020-08-11 DIAGNOSIS — R739 Hyperglycemia, unspecified: Secondary | ICD-10-CM | POA: Diagnosis not present

## 2020-08-11 DIAGNOSIS — Z9102 Food additives allergy status: Secondary | ICD-10-CM | POA: Diagnosis not present

## 2020-08-11 DIAGNOSIS — K760 Fatty (change of) liver, not elsewhere classified: Secondary | ICD-10-CM | POA: Diagnosis not present

## 2020-08-11 DIAGNOSIS — Z881 Allergy status to other antibiotic agents status: Secondary | ICD-10-CM | POA: Diagnosis not present

## 2020-08-11 DIAGNOSIS — Z7982 Long term (current) use of aspirin: Secondary | ICD-10-CM | POA: Diagnosis not present

## 2020-08-11 DIAGNOSIS — Z91010 Allergy to peanuts: Secondary | ICD-10-CM | POA: Diagnosis not present

## 2020-08-11 DIAGNOSIS — Z91013 Allergy to seafood: Secondary | ICD-10-CM | POA: Diagnosis not present

## 2020-08-11 DIAGNOSIS — R0602 Shortness of breath: Secondary | ICD-10-CM | POA: Diagnosis not present

## 2020-08-11 DIAGNOSIS — F10129 Alcohol abuse with intoxication, unspecified: Secondary | ICD-10-CM | POA: Diagnosis not present

## 2020-08-11 DIAGNOSIS — J9601 Acute respiratory failure with hypoxia: Secondary | ICD-10-CM | POA: Diagnosis not present

## 2020-08-11 DIAGNOSIS — F10229 Alcohol dependence with intoxication, unspecified: Secondary | ICD-10-CM | POA: Diagnosis not present

## 2020-08-11 DIAGNOSIS — T380X5A Adverse effect of glucocorticoids and synthetic analogues, initial encounter: Secondary | ICD-10-CM | POA: Diagnosis not present

## 2020-08-11 DIAGNOSIS — F1020 Alcohol dependence, uncomplicated: Secondary | ICD-10-CM | POA: Diagnosis not present

## 2020-08-11 DIAGNOSIS — J1282 Pneumonia due to coronavirus disease 2019: Secondary | ICD-10-CM | POA: Diagnosis not present

## 2020-08-11 DIAGNOSIS — Z91018 Allergy to other foods: Secondary | ICD-10-CM | POA: Diagnosis not present

## 2020-08-11 DIAGNOSIS — F32A Depression, unspecified: Secondary | ICD-10-CM | POA: Diagnosis present

## 2020-08-11 DIAGNOSIS — Z79899 Other long term (current) drug therapy: Secondary | ICD-10-CM | POA: Diagnosis not present

## 2020-08-11 DIAGNOSIS — E872 Acidosis: Secondary | ICD-10-CM | POA: Diagnosis not present

## 2020-08-11 DIAGNOSIS — F419 Anxiety disorder, unspecified: Secondary | ICD-10-CM | POA: Diagnosis present

## 2020-08-11 DIAGNOSIS — Z885 Allergy status to narcotic agent status: Secondary | ICD-10-CM | POA: Diagnosis not present

## 2020-08-11 DIAGNOSIS — R9431 Abnormal electrocardiogram [ECG] [EKG]: Secondary | ICD-10-CM | POA: Diagnosis not present

## 2020-08-11 MED ORDER — ALBUTEROL SULFATE HFA 108 (90 BASE) MCG/ACT IN AERS: 2.0000 | INHALATION_SPRAY | Freq: Once | RESPIRATORY_TRACT | Status: DC | PRN

## 2020-08-11 MED ORDER — METHYLPREDNISOLONE SODIUM SUCC 125 MG IJ SOLR: 125.0000 mg | Freq: Once | INTRAMUSCULAR | Status: DC | PRN

## 2020-08-11 MED ORDER — SODIUM CHLORIDE 0.9 % IV SOLN: INTRAVENOUS | Status: DC | PRN

## 2020-08-11 MED ORDER — EPINEPHRINE 0.3 MG/0.3ML IJ SOAJ: 0.3000 mg | Freq: Once | INTRAMUSCULAR | Status: DC | PRN

## 2020-08-11 MED ORDER — FAMOTIDINE IN NACL 20-0.9 MG/50ML-% IV SOLN: 20.0000 mg | Freq: Once | INTRAVENOUS | Status: DC | PRN

## 2020-08-11 MED ORDER — DIPHENHYDRAMINE HCL 50 MG/ML IJ SOLN: 50.0000 mg | Freq: Once | INTRAMUSCULAR | Status: DC | PRN

## 2020-08-11 MED ORDER — SODIUM CHLORIDE 0.9 % IV SOLN: Freq: Once | INTRAVENOUS | Status: AC

## 2020-08-11 NOTE — Progress Notes (Signed)
  Diagnosis: COVID-19  Physician: Dr. Wright   Procedure: Covid Infusion Clinic Med: bamlanivimab\etesevimab infusion - Provided patient with bamlanimivab\etesevimab fact sheet for patients, parents and caregivers prior to infusion.  Complications: No immediate complications noted.  Discharge: Discharged home   Carylon Tamburro Richards 08/11/2020   

## 2020-08-11 NOTE — Progress Notes (Signed)
Patient reviewed Fact Sheet for Patients, Parents, and Caregivers for Emergency Use Authorization (EUA) of bamlanivimab and etesevimab for the Treatment of Coronavirus. Patient also reviewed and is agreeable to the estimated cost of treatment. Patient is agreeable to proceed.   

## 2020-08-11 NOTE — Discharge Instructions (Signed)
10 Things You Can Do to Manage Your COVID-19 Symptoms at Home If you have possible or confirmed COVID-19: 1. Stay home from work and school. And stay away from other public places. If you must go out, avoid using any kind of public transportation, ridesharing, or taxis. 2. Monitor your symptoms carefully. If your symptoms get worse, call your healthcare provider immediately. 3. Get rest and stay hydrated. 4. If you have a medical appointment, call the healthcare provider ahead of time and tell them that you have or may have COVID-19. 5. For medical emergencies, call 911 and notify the dispatch personnel that you have or may have COVID-19. 6. Cover your cough and sneezes with a tissue or use the inside of your elbow. 7. Wash your hands often with soap and water for at least 20 seconds or clean your hands with an alcohol-based hand sanitizer that contains at least 60% alcohol. 8. As much as possible, stay in a specific room and away from other people in your home. Also, you should use a separate bathroom, if available. If you need to be around other people in or outside of the home, wear a mask. 9. Avoid sharing personal items with other people in your household, like dishes, towels, and bedding. 10. Clean all surfaces that are touched often, like counters, tabletops, and doorknobs. Use household cleaning sprays or wipes according to the label instructions. cdc.gov/coronavirus 02/21/2019 This information is not intended to replace advice given to you by your health care provider. Make sure you discuss any questions you have with your health care provider. Document Revised: 07/26/2019 Document Reviewed: 07/26/2019 Elsevier Patient Education  2020 Elsevier Inc. What types of side effects do monoclonal antibody drugs cause?  Common side effects  In general, the more common side effects caused by monoclonal antibody drugs include: . Allergic reactions, such as hives or itching . Flu-like signs and  symptoms, including chills, fatigue, fever, and muscle aches and pains . Nausea, vomiting . Diarrhea . Skin rashes . Low blood pressure   The CDC is recommending patients who receive monoclonal antibody treatments wait at least 90 days before being vaccinated.  Currently, there are no data on the safety and efficacy of mRNA COVID-19 vaccines in persons who received monoclonal antibodies or convalescent plasma as part of COVID-19 treatment. Based on the estimated half-life of such therapies as well as evidence suggesting that reinfection is uncommon in the 90 days after initial infection, vaccination should be deferred for at least 90 days, as a precautionary measure until additional information becomes available, to avoid interference of the antibody treatment with vaccine-induced immune responses. If you have any questions or concerns after the infusion please call the Advanced Practice Provider on call at [phone]. This number is ONLY intended for your use regarding questions or concerns about the infusion post-treatment side-effects.  Please do not provide this number to others for use. For return to work notes please contact your primary care provider.   If someone you know is interested in receiving treatment please have them call the COVID hotline at [phone].   

## 2020-08-13 ENCOUNTER — Emergency Department (HOSPITAL_BASED_OUTPATIENT_CLINIC_OR_DEPARTMENT_OTHER): Payer: 59

## 2020-08-13 ENCOUNTER — Inpatient Hospital Stay (HOSPITAL_COMMUNITY): Payer: 59

## 2020-08-13 ENCOUNTER — Inpatient Hospital Stay (HOSPITAL_BASED_OUTPATIENT_CLINIC_OR_DEPARTMENT_OTHER)
Admission: EM | Admit: 2020-08-13 | Discharge: 2020-08-16 | DRG: 177 | Disposition: A | Discharge status: Home / Self Care | Payer: 59 | Attending: Internal Medicine | Admitting: Internal Medicine

## 2020-08-13 ENCOUNTER — Other Ambulatory Visit: Payer: Self-pay

## 2020-08-13 ENCOUNTER — Encounter (HOSPITAL_BASED_OUTPATIENT_CLINIC_OR_DEPARTMENT_OTHER): Payer: Self-pay | Admitting: *Deleted

## 2020-08-13 DIAGNOSIS — Z7982 Long term (current) use of aspirin: Secondary | ICD-10-CM | POA: Diagnosis not present

## 2020-08-13 DIAGNOSIS — J9601 Acute respiratory failure with hypoxia: Secondary | ICD-10-CM | POA: Diagnosis present

## 2020-08-13 DIAGNOSIS — F17290 Nicotine dependence, other tobacco product, uncomplicated: Secondary | ICD-10-CM | POA: Diagnosis present

## 2020-08-13 DIAGNOSIS — J1282 Pneumonia due to coronavirus disease 2019: Secondary | ICD-10-CM | POA: Diagnosis present

## 2020-08-13 DIAGNOSIS — Z79899 Other long term (current) drug therapy: Secondary | ICD-10-CM

## 2020-08-13 DIAGNOSIS — E876 Hypokalemia: Secondary | ICD-10-CM | POA: Diagnosis present

## 2020-08-13 DIAGNOSIS — Z881 Allergy status to other antibiotic agents status: Secondary | ICD-10-CM

## 2020-08-13 DIAGNOSIS — I10 Essential (primary) hypertension: Secondary | ICD-10-CM | POA: Diagnosis present

## 2020-08-13 DIAGNOSIS — Z9102 Food additives allergy status: Secondary | ICD-10-CM | POA: Diagnosis not present

## 2020-08-13 DIAGNOSIS — Z91013 Allergy to seafood: Secondary | ICD-10-CM | POA: Diagnosis not present

## 2020-08-13 DIAGNOSIS — Z888 Allergy status to other drugs, medicaments and biological substances status: Secondary | ICD-10-CM | POA: Diagnosis not present

## 2020-08-13 DIAGNOSIS — D696 Thrombocytopenia, unspecified: Secondary | ICD-10-CM | POA: Diagnosis present

## 2020-08-13 DIAGNOSIS — Z86718 Personal history of other venous thrombosis and embolism: Secondary | ICD-10-CM

## 2020-08-13 DIAGNOSIS — E872 Acidosis: Secondary | ICD-10-CM | POA: Diagnosis present

## 2020-08-13 DIAGNOSIS — R739 Hyperglycemia, unspecified: Secondary | ICD-10-CM | POA: Diagnosis not present

## 2020-08-13 DIAGNOSIS — Z91018 Allergy to other foods: Secondary | ICD-10-CM | POA: Diagnosis not present

## 2020-08-13 DIAGNOSIS — F32A Depression, unspecified: Secondary | ICD-10-CM | POA: Diagnosis present

## 2020-08-13 DIAGNOSIS — T380X5A Adverse effect of glucocorticoids and synthetic analogues, initial encounter: Secondary | ICD-10-CM | POA: Diagnosis not present

## 2020-08-13 DIAGNOSIS — Z91010 Allergy to peanuts: Secondary | ICD-10-CM | POA: Diagnosis not present

## 2020-08-13 DIAGNOSIS — Z885 Allergy status to narcotic agent status: Secondary | ICD-10-CM

## 2020-08-13 DIAGNOSIS — F1020 Alcohol dependence, uncomplicated: Secondary | ICD-10-CM | POA: Diagnosis not present

## 2020-08-13 DIAGNOSIS — R9431 Abnormal electrocardiogram [ECG] [EKG]: Secondary | ICD-10-CM

## 2020-08-13 DIAGNOSIS — F10129 Alcohol abuse with intoxication, unspecified: Secondary | ICD-10-CM | POA: Diagnosis not present

## 2020-08-13 DIAGNOSIS — F10229 Alcohol dependence with intoxication, unspecified: Secondary | ICD-10-CM | POA: Diagnosis present

## 2020-08-13 DIAGNOSIS — U071 COVID-19: Secondary | ICD-10-CM | POA: Diagnosis present

## 2020-08-13 DIAGNOSIS — R7989 Other specified abnormal findings of blood chemistry: Secondary | ICD-10-CM

## 2020-08-13 DIAGNOSIS — F419 Anxiety disorder, unspecified: Secondary | ICD-10-CM | POA: Diagnosis present

## 2020-08-13 DIAGNOSIS — K760 Fatty (change of) liver, not elsewhere classified: Secondary | ICD-10-CM | POA: Diagnosis not present

## 2020-08-13 LAB — CBC
HCT: 39 % (ref 39.0–52.0)
Hemoglobin: 13.8 g/dL (ref 13.0–17.0)
MCH: 35.4 pg — ABNORMAL HIGH (ref 26.0–34.0)
MCHC: 35.4 g/dL (ref 30.0–36.0)
MCV: 100 fL (ref 80.0–100.0)
Platelets: 96 10*3/uL — ABNORMAL LOW (ref 150–400)
RBC: 3.9 MIL/uL — ABNORMAL LOW (ref 4.22–5.81)
RDW: 13.3 % (ref 11.5–15.5)
WBC: 2 10*3/uL — ABNORMAL LOW (ref 4.0–10.5)
nRBC: 0 % (ref 0.0–0.2)

## 2020-08-13 LAB — RESP PANEL BY RT-PCR (FLU A&B, COVID) ARPGX2
Influenza A by PCR: NEGATIVE
Influenza B by PCR: NEGATIVE
SARS Coronavirus 2 by RT PCR: POSITIVE — AB

## 2020-08-13 LAB — LACTIC ACID, PLASMA
Lactic Acid, Venous: 3.2 mmol/L (ref 0.5–1.9)
Lactic Acid, Venous: 2.7 mmol/L (ref 0.5–1.9)
Lactic Acid, Venous: 2.7 mmol/L (ref 0.5–1.9)

## 2020-08-13 LAB — HEPATIC FUNCTION PANEL
ALT: 50 U/L — ABNORMAL HIGH (ref 0–44)
AST: 160 U/L — ABNORMAL HIGH (ref 15–41)
Albumin: 3.7 g/dL (ref 3.5–5.0)
Alkaline Phosphatase: 99 U/L (ref 38–126)
Bilirubin, Direct: 0.3 mg/dL — ABNORMAL HIGH (ref 0.0–0.2)
Indirect Bilirubin: 0.8 mg/dL (ref 0.3–0.9)
Total Bilirubin: 1.1 mg/dL (ref 0.3–1.2)
Total Protein: 6.9 g/dL (ref 6.5–8.1)

## 2020-08-13 LAB — CBC WITH DIFFERENTIAL/PLATELET
Abs Immature Granulocytes: 0.01 10*3/uL (ref 0.00–0.07)
Basophils Absolute: 0 10*3/uL (ref 0.0–0.1)
Basophils Relative: 1 %
Eosinophils Absolute: 0.1 10*3/uL (ref 0.0–0.5)
Eosinophils Relative: 2 %
HCT: 37.5 % — ABNORMAL LOW (ref 39.0–52.0)
Hemoglobin: 13.8 g/dL (ref 13.0–17.0)
Immature Granulocytes: 0 %
Lymphocytes Relative: 47 %
Lymphs Abs: 1.6 10*3/uL (ref 0.7–4.0)
MCH: 35.7 pg — ABNORMAL HIGH (ref 26.0–34.0)
MCHC: 36.8 g/dL — ABNORMAL HIGH (ref 30.0–36.0)
MCV: 96.9 fL (ref 80.0–100.0)
Monocytes Absolute: 0.5 10*3/uL (ref 0.1–1.0)
Monocytes Relative: 16 %
Neutro Abs: 1.2 10*3/uL — ABNORMAL LOW (ref 1.7–7.7)
Neutrophils Relative %: 34 %
Platelets: 107 10*3/uL — ABNORMAL LOW (ref 150–400)
RBC: 3.87 MIL/uL — ABNORMAL LOW (ref 4.22–5.81)
RDW: 13.2 % (ref 11.5–15.5)
WBC: 3.4 10*3/uL — ABNORMAL LOW (ref 4.0–10.5)
nRBC: 0 % (ref 0.0–0.2)

## 2020-08-13 LAB — BASIC METABOLIC PANEL
Anion gap: 17 — ABNORMAL HIGH (ref 5–15)
BUN: 10 mg/dL (ref 6–20)
CO2: 21 mmol/L — ABNORMAL LOW (ref 22–32)
Calcium: 9.6 mg/dL (ref 8.9–10.3)
Chloride: 99 mmol/L (ref 98–111)
Creatinine, Ser: 0.74 mg/dL (ref 0.61–1.24)
GFR, Estimated: >60 mL/min (ref >60)
Glucose, Bld: 92 mg/dL (ref 70–99)
Potassium: 2.9 mmol/L — ABNORMAL LOW (ref 3.5–5.1)
Sodium: 137 mmol/L (ref 135–145)

## 2020-08-13 LAB — D-DIMER, QUANTITATIVE: D-Dimer, Quant: 0.62 ug/mL-FEU — ABNORMAL HIGH (ref 0.00–0.50)

## 2020-08-13 LAB — LACTATE DEHYDROGENASE: LDH: 215 U/L — ABNORMAL HIGH (ref 98–192)

## 2020-08-13 LAB — MAGNESIUM
Magnesium: 1.5 mg/dL — ABNORMAL LOW (ref 1.7–2.4)
Magnesium: 1.6 mg/dL — ABNORMAL LOW (ref 1.7–2.4)

## 2020-08-13 LAB — C-REACTIVE PROTEIN: CRP: 2.3 mg/dL — ABNORMAL HIGH (ref <1.0)

## 2020-08-13 LAB — TROPONIN I (HIGH SENSITIVITY): Troponin I (High Sensitivity): 8 ng/L (ref <18)

## 2020-08-13 LAB — PROCALCITONIN: Procalcitonin: <0.1 ng/mL

## 2020-08-13 LAB — TRIGLYCERIDES: Triglycerides: 152 mg/dL — ABNORMAL HIGH (ref <150)

## 2020-08-13 LAB — HIV ANTIBODY (ROUTINE TESTING W REFLEX): HIV Screen 4th Generation wRfx: NONREACTIVE

## 2020-08-13 LAB — FIBRINOGEN: Fibrinogen: 490 mg/dL — ABNORMAL HIGH (ref 210–475)

## 2020-08-13 LAB — FERRITIN: Ferritin: 190 ng/mL (ref 24–336)

## 2020-08-13 LAB — CREATININE, SERUM
Creatinine, Ser: 0.68 mg/dL (ref 0.61–1.24)
GFR, Estimated: >60 mL/min (ref >60)

## 2020-08-13 LAB — POTASSIUM: Potassium: 3.8 mmol/L (ref 3.5–5.1)

## 2020-08-13 LAB — ETHANOL: Alcohol, Ethyl (B): 176 mg/dL — ABNORMAL HIGH (ref <10)

## 2020-08-13 MED ORDER — ESCITALOPRAM OXALATE 20 MG PO TABS
20.0000 mg | ORAL_TABLET | Freq: Every day | ORAL | Status: DC
  Administered 2020-08-13 – 2020-08-16 (×4): 20 mg via ORAL
  Filled 2020-08-13 (×4): qty 1

## 2020-08-13 MED ORDER — SODIUM CHLORIDE 0.9 % IV SOLN
INTRAVENOUS | Status: DC | PRN
  Administered 2020-08-13: 1000 mL via INTRAVENOUS

## 2020-08-13 MED ORDER — DEXAMETHASONE SODIUM PHOSPHATE 10 MG/ML IJ SOLN
6.0000 mg | Freq: Once | INTRAMUSCULAR | Status: AC
  Administered 2020-08-13: 6 mg via INTRAVENOUS
  Filled 2020-08-13: qty 1

## 2020-08-13 MED ORDER — ADULT MULTIVITAMIN W/MINERALS CH
1.0000 | ORAL_TABLET | Freq: Every day | ORAL | Status: DC
  Administered 2020-08-14 – 2020-08-16 (×3): 1 via ORAL
  Filled 2020-08-13 (×3): qty 1

## 2020-08-13 MED ORDER — LISINOPRIL 10 MG PO TABS
10.0000 mg | ORAL_TABLET | Freq: Every day | ORAL | Status: DC
  Administered 2020-08-13 – 2020-08-16 (×4): 10 mg via ORAL
  Filled 2020-08-13 (×4): qty 1

## 2020-08-13 MED ORDER — PANTOPRAZOLE SODIUM 40 MG PO TBEC
40.0000 mg | DELAYED_RELEASE_TABLET | Freq: Every day | ORAL | Status: DC
  Administered 2020-08-13 – 2020-08-16 (×4): 40 mg via ORAL
  Filled 2020-08-13 (×4): qty 1

## 2020-08-13 MED ORDER — IPRATROPIUM-ALBUTEROL 20-100 MCG/ACT IN AERS
1.0000 | INHALATION_SPRAY | Freq: Four times a day (QID) | RESPIRATORY_TRACT | Status: DC
  Administered 2020-08-13 – 2020-08-16 (×11): 1 via RESPIRATORY_TRACT
  Filled 2020-08-13: qty 4

## 2020-08-13 MED ORDER — POTASSIUM CHLORIDE CRYS ER 20 MEQ PO TBCR
40.0000 meq | EXTENDED_RELEASE_TABLET | Freq: Two times a day (BID) | ORAL | Status: DC
  Administered 2020-08-13: 40 meq via ORAL
  Filled 2020-08-13: qty 2

## 2020-08-13 MED ORDER — LORAZEPAM 2 MG/ML IJ SOLN
1.0000 mg | INTRAMUSCULAR | Status: DC | PRN
  Administered 2020-08-13: 2 mg via INTRAVENOUS
  Filled 2020-08-13: qty 1

## 2020-08-13 MED ORDER — POTASSIUM CHLORIDE 10 MEQ/100ML IV SOLN
10.0000 meq | INTRAVENOUS | Status: AC
  Administered 2020-08-13 (×2): 10 meq via INTRAVENOUS
  Filled 2020-08-13 (×2): qty 100

## 2020-08-13 MED ORDER — THIAMINE HCL 100 MG PO TABS
100.0000 mg | ORAL_TABLET | Freq: Every day | ORAL | Status: DC
  Filled 2020-08-13: qty 1

## 2020-08-13 MED ORDER — MAGNESIUM SULFATE 2 GM/50ML IV SOLN
2.0000 g | Freq: Once | INTRAVENOUS | Status: AC
  Administered 2020-08-13: 2 g via INTRAVENOUS
  Filled 2020-08-13: qty 50

## 2020-08-13 MED ORDER — LORAZEPAM 2 MG/ML IJ SOLN: 1.0000 mg | Freq: Once | INTRAMUSCULAR | Status: DC

## 2020-08-13 MED ORDER — THIAMINE HCL 100 MG PO TABS
100.0000 mg | ORAL_TABLET | Freq: Every day | ORAL | Status: DC
  Administered 2020-08-14 – 2020-08-16 (×3): 100 mg via ORAL
  Filled 2020-08-13 (×4): qty 1

## 2020-08-13 MED ORDER — MIRTAZAPINE 15 MG PO TABS
15.0000 mg | ORAL_TABLET | Freq: Every day | ORAL | Status: DC
  Administered 2020-08-13 – 2020-08-15 (×3): 15 mg via ORAL
  Filled 2020-08-13 (×4): qty 1

## 2020-08-13 MED ORDER — ZINC SULFATE 220 (50 ZN) MG PO CAPS
220.0000 mg | ORAL_CAPSULE | Freq: Every day | ORAL | Status: DC
  Administered 2020-08-13 – 2020-08-16 (×4): 220 mg via ORAL
  Filled 2020-08-13 (×4): qty 1

## 2020-08-13 MED ORDER — ASPIRIN 81 MG PO CHEW
81.0000 mg | CHEWABLE_TABLET | Freq: Every day | ORAL | Status: DC
  Administered 2020-08-13 – 2020-08-16 (×4): 81 mg via ORAL
  Filled 2020-08-13 (×4): qty 1

## 2020-08-13 MED ORDER — ONDANSETRON HCL 4 MG/2ML IJ SOLN
4.0000 mg | Freq: Three times a day (TID) | INTRAMUSCULAR | Status: DC | PRN
  Administered 2020-08-13: 4 mg via INTRAVENOUS
  Filled 2020-08-13: qty 2

## 2020-08-13 MED ORDER — POTASSIUM CHLORIDE CRYS ER 20 MEQ PO TBCR: 40.0000 meq | EXTENDED_RELEASE_TABLET | Freq: Two times a day (BID) | ORAL | Status: DC

## 2020-08-13 MED ORDER — DEXAMETHASONE SODIUM PHOSPHATE 10 MG/ML IJ SOLN
6.0000 mg | INTRAMUSCULAR | Status: DC
  Administered 2020-08-14: 6 mg via INTRAVENOUS
  Filled 2020-08-13: qty 1

## 2020-08-13 MED ORDER — SODIUM CHLORIDE 0.9 % IV SOLN
100.0000 mg | Freq: Every day | INTRAVENOUS | Status: DC
  Administered 2020-08-14 – 2020-08-16 (×3): 100 mg via INTRAVENOUS
  Filled 2020-08-13 (×3): qty 20

## 2020-08-13 MED ORDER — FOLIC ACID 1 MG PO TABS
1.0000 mg | ORAL_TABLET | Freq: Every day | ORAL | Status: DC
  Administered 2020-08-13 – 2020-08-16 (×4): 1 mg via ORAL
  Filled 2020-08-13 (×4): qty 1

## 2020-08-13 MED ORDER — ACETAMINOPHEN 325 MG PO TABS
650.0000 mg | ORAL_TABLET | Freq: Four times a day (QID) | ORAL | Status: DC | PRN
  Administered 2020-08-13: 650 mg via ORAL
  Filled 2020-08-13: qty 2

## 2020-08-13 MED ORDER — ASCORBIC ACID 500 MG PO TABS
500.0000 mg | ORAL_TABLET | Freq: Every day | ORAL | Status: DC
  Administered 2020-08-13 – 2020-08-16 (×4): 500 mg via ORAL
  Filled 2020-08-13 (×4): qty 1

## 2020-08-13 MED ORDER — LORAZEPAM 1 MG PO TABS: 0.0000 mg | ORAL_TABLET | Freq: Four times a day (QID) | ORAL | Status: AC

## 2020-08-13 MED ORDER — LORAZEPAM 1 MG PO TABS: 1.0000 mg | ORAL_TABLET | ORAL | Status: DC | PRN

## 2020-08-13 MED ORDER — SODIUM CHLORIDE 0.9 % IV SOLN
INTRAVENOUS | Status: DC | PRN
  Administered 2020-08-13: 250 mL via INTRAVENOUS

## 2020-08-13 MED ORDER — POTASSIUM CHLORIDE CRYS ER 20 MEQ PO TBCR
40.0000 meq | EXTENDED_RELEASE_TABLET | Freq: Once | ORAL | Status: AC
  Administered 2020-08-13: 40 meq via ORAL
  Filled 2020-08-13: qty 2

## 2020-08-13 MED ORDER — BUSPIRONE HCL 10 MG PO TABS
20.0000 mg | ORAL_TABLET | Freq: Three times a day (TID) | ORAL | Status: DC
  Administered 2020-08-13 – 2020-08-16 (×10): 20 mg via ORAL
  Filled 2020-08-13 (×11): qty 2

## 2020-08-13 MED ORDER — BENZONATATE 100 MG PO CAPS: 200.0000 mg | ORAL_CAPSULE | Freq: Three times a day (TID) | ORAL | Status: DC | PRN

## 2020-08-13 MED ORDER — SODIUM CHLORIDE 0.9 % IV SOLN: Freq: Once | INTRAVENOUS | Status: AC

## 2020-08-13 MED ORDER — SODIUM CHLORIDE 0.9 % IV SOLN
100.0000 mg | INTRAVENOUS | Status: AC
  Administered 2020-08-13 (×2): 100 mg via INTRAVENOUS
  Filled 2020-08-13 (×2): qty 20

## 2020-08-13 MED ORDER — GUAIFENESIN-DM 100-10 MG/5ML PO SYRP: 10.0000 mL | ORAL_SOLUTION | ORAL | Status: DC | PRN

## 2020-08-13 MED ORDER — LORAZEPAM 2 MG/ML IJ SOLN
0.0000 mg | Freq: Four times a day (QID) | INTRAMUSCULAR | Status: AC
  Administered 2020-08-13: 1 mg via INTRAVENOUS
  Filled 2020-08-13: qty 1

## 2020-08-13 MED ORDER — LORAZEPAM 1 MG PO TABS: 0.0000 mg | ORAL_TABLET | Freq: Two times a day (BID) | ORAL | Status: DC

## 2020-08-13 MED ORDER — ENOXAPARIN SODIUM 40 MG/0.4ML ~~LOC~~ SOLN
40.0000 mg | SUBCUTANEOUS | Status: DC
  Administered 2020-08-13 – 2020-08-14 (×2): 40 mg via SUBCUTANEOUS
  Filled 2020-08-13 (×2): qty 0.4

## 2020-08-13 MED ORDER — THIAMINE HCL 100 MG/ML IJ SOLN
100.0000 mg | Freq: Every day | INTRAMUSCULAR | Status: DC
  Filled 2020-08-13: qty 2

## 2020-08-13 MED ORDER — THIAMINE HCL 100 MG/ML IJ SOLN
100.0000 mg | Freq: Every day | INTRAMUSCULAR | Status: DC
  Administered 2020-08-13: 100 mg via INTRAVENOUS
  Filled 2020-08-13: qty 2

## 2020-08-13 MED ORDER — MAGNESIUM OXIDE 400 (241.3 MG) MG PO TABS
400.0000 mg | ORAL_TABLET | Freq: Two times a day (BID) | ORAL | Status: DC
  Administered 2020-08-13 – 2020-08-16 (×7): 400 mg via ORAL
  Filled 2020-08-13 (×7): qty 1

## 2020-08-13 MED ORDER — LORAZEPAM 2 MG/ML IJ SOLN
0.0000 mg | Freq: Two times a day (BID) | INTRAMUSCULAR | Status: DC
  Filled 2020-08-13: qty 1

## 2020-08-13 NOTE — ED Notes (Signed)
Date and time results received: 08/13/20 0350  Test: lactic acid Critical Value: 3.2  Name of Provider Notified: Dr. Leonides Schanz  Orders Received? Or Actions Taken?: no new orders

## 2020-08-13 NOTE — ED Notes (Signed)
Date and time results received: 08/13/20 5:43 AM  (use smartphrase ".now" to insert current time)  Test: Lactic Acid Critical Value: 2.7  Name of Provider Notified: Dr. Leonides Schanz  Orders Received? Or Actions Taken?:

## 2020-08-13 NOTE — Progress Notes (Signed)
CRITICAL VALUE ALERT  Critical Value:  Lactic Acid 2.7  Date & Time Notied:  08/13/20  1407  Provider Notified: Dr Marylyn Ishihara  Orders Received/Actions taken: continue fluids

## 2020-08-13 NOTE — ED Notes (Signed)
Pt's oxygen sat 89% on RA. Pt placed on oxygen via [place] at 2 L.

## 2020-08-13 NOTE — ED Notes (Signed)
Report received from Lisa RN.

## 2020-08-13 NOTE — H&P (Signed)
History and Physical    Abdulrahim Siddiqi GYI:[id number] DOB: 15-Nov-1965 DOA: 08/13/2020  PCP: [name], DO  Patient coming from: [facility]  Chief Complaint: Dyspnea.  HPI: Juan [name] is a 54 y.o. male with medical history significant of EtOH abuse, HTN, anxiety. Presenting with dyspnea. Reports that 7 days ago he felt like he had the flu. He was suffering with temperature of 102, chills, sweats, aches. He tried APAP, but it was of no help. He spoke with a friend who recommended he get tested for COVID. He tested positive. So, he spoke with his PCP, who arranged for him to complete an antibody infusion. He reports that he completed that infusion on Friday. However, he seemed to get worse over the weekend. Last night he found that his pulse ox was down to 82%, so he decided to come to the ED.    ED Course: Found to be COVID+. Started on O2, remdes, steroids. TRH called for admission.   Review of Systems:  Denies CP, N, V, palpitations.  Review of systems is otherwise negative for all not mentioned in HPI.   PMHx Past Medical History:  Diagnosis Date  . Alcohol addiction ([place])   . Anxiety   . Colon polyps   . Depression   . Hemochromatosis   . Hx of blood clots    Leg  . Hypertension     PSHx Past Surgical History:  Procedure Laterality Date  . FRACTURE SURGERY    . HERNIA REPAIR    . VASECTOMY      SocHx  reports that he has never smoked. His smokeless tobacco use includes snuff. He reports previous alcohol use. He reports that he does not use drugs.  Allergies  Allergen Reactions  . Charentais Melon (French Melon) Anaphylaxis  . Cucumber Extract Itching and Nausea And Vomiting    No extracts; just cucumber   . Other Anaphylaxis  . Peanut Butter Flavor Anaphylaxis and Swelling  . Peanut Oil Swelling  . Shellfish Allergy Itching and Swelling  . Cantaloupe Extract Allergy Skin Test Rash  . Lactose Other (See Comments)  . Strawberry Extract Nausea And Vomiting and  Swelling  . Vancomycin Rash  . Codeine     itching  . Depakote Er [Divalproex Sodium Er]     Tongue swelling    FamHx No family history on file.  Prior to Admission medications   Medication Sig Start Date End Date Taking? Authorizing Provider  aspirin 81 MG chewable tablet Chew 81 mg by mouth daily.   Yes [provider]  benzonatate (TESSALON) 200 MG capsule Take 1 capsule (200 mg total) by mouth 3 (three) times daily as needed for cough. 08/07/20  Yes [name], DO  busPIRone (BUSPAR) 10 MG tablet Take 2 tablets (20 mg total) by mouth 3 (three) times daily. 07/10/20 10/08/20 Yes [name], DO  Cholecalciferol 50 MCG (2000 UT) CAPS Take 1 capsule by mouth daily.   Yes [provider]  EPINEPHrine 0.3 mg/0.3 mL IJ SOAJ injection Inject 0.3 mg into the muscle as needed for anaphylaxis. 07/08/20  Yes [name], DO  escitalopram (LEXAPRO) 20 MG tablet Take 20 mg by mouth daily. 03/07/20  Yes [provider]  folic acid (FOLVITE) 1 MG tablet Take 1 tablet (1 mg total) by mouth daily. 05/29/20  Yes [name], DO  lisinopril (ZESTRIL) 10 MG tablet Take 1 tablet (10 mg total) by mouth daily. 05/29/20 05/29/21 Yes [name], DO  Magnesium Oxide 400 (240 Mg)  MG TABS Take 240 mg by mouth.   Yes [provider]  mirtazapine (REMERON) 15 MG tablet Take 15 mg by mouth at bedtime. 03/07/20  Yes [provider]  Multiple Vitamins-Minerals (YOUR LIFE MULTI ADULT GUMMIES) CHEW Chew 1 tablet by mouth daily.   Yes [provider]  pantoprazole (PROTONIX) 40 MG tablet Take 40 mg by mouth daily. 02/12/20 02/11/21 Yes [provider]  saccharomyces boulardii (FLORASTOR) 250 MG capsule Take 250 mg by mouth 2 (two) times daily.    Yes [provider]  thiamine 100 MG tablet Take 100 mg by mouth daily. 11/14/19  Yes [provider]  diclofenac (VOLTAREN) 75 MG EC tablet Take by mouth. Patient not taking: No sig  reported 10/15/19   [provider]    Physical Exam: Vitals:   08/13/20 0730 08/13/20 0830 08/13/20 0832 08/13/20 1009  BP: 118/88 122/84  126/84  Pulse: 73   80  Resp: 15 15  (!) 22  Temp:    98.2 F (36.8 C)  SpO2: 94%  97% 95%  Weight:      Height:        General: 54 y.o. male resting in bed in NAD Eyes: PERRL, normal sclera ENMT: Nares patent w/o discharge, orophaynx clear, dentition normal, ears w/o discharge/lesions/ulcers Neck: Supple, trachea midline Cardiovascular: RRR, +S1, S2, no m/g/r, equal pulses throughout Respiratory: decreased at bases, no w/r/r, normal WOB on 2L [place] GI: BS+, NDNT, no masses noted, no organomegaly noted MSK: No e/c/c Skin: No rashes, bruises, ulcerations noted Neuro: A&O x 3, no focal deficits Psyc: Appropriate interaction and affect, calm/cooperative  Labs on Admission: I have personally reviewed following labs and imaging studies  CBC: Recent Labs  Lab 08/13/20 0155  WBC 3.4*  NEUTROABS 1.2*  HGB 13.8  HCT 37.5*  MCV 96.9  PLT 371*   Basic Metabolic Panel: Recent Labs  Lab 08/13/20 0125 08/13/20 0751  NA 137  --   K 2.9* 3.8  CL 99  --   CO2 21*  --   GLUCOSE 92  --   BUN 10  --   CREATININE 0.74  --   CALCIUM 9.6  --   MG 1.5* 1.6*   GFR: Estimated Creatinine Clearance: 104.3 mL/min (by C-G formula based on SCr of 0.74 mg/dL). Liver Function Tests: Recent Labs  Lab 08/13/20 0320  AST 160*  ALT 50*  ALKPHOS 99  BILITOT 1.1  PROT 6.9  ALBUMIN 3.7   No results for input(s): LIPASE, AMYLASE in the last 168 hours. No results for input(s): AMMONIA in the last 168 hours. Coagulation Profile: No results for input(s): INR, PROTIME in the last 168 hours. Cardiac Enzymes: No results for input(s): CKTOTAL, CKMB, CKMBINDEX, TROPONINI in the last 168 hours. BNP (last 3 results) No results for input(s): PROBNP in the last 8760 hours. HbA1C: No results for input(s): HGBA1C in the last 72 hours. CBG: No  results for input(s): GLUCAP in the last 168 hours. Lipid Profile: Recent Labs    08/13/20 0320  TRIG 152*   Thyroid Function Tests: No results for input(s): TSH, T4TOTAL, FREET4, T3FREE, THYROIDAB in the last 72 hours. Anemia Panel: Recent Labs    08/13/20 0320  FERRITIN 190   Urine analysis: No results found for: COLORURINE, APPEARANCEUR, LABSPEC, PHURINE, GLUCOSEU, HGBUR, BILIRUBINUR, KETONESUR, PROTEINUR, UROBILINOGEN, NITRITE, LEUKOCYTESUR  Radiological Exams on Admission: DG Chest Portable 1 View  Result Date: 08/13/2020 CLINICAL DATA:  COVID-19 positive, shortness of breath EXAM: PORTABLE  CHEST 1 VIEW COMPARISON:  08/08/2019 FINDINGS: No consolidation, features of edema, pneumothorax, or effusion. Pulmonary vascularity is normally distributed. The cardiomediastinal contours are unremarkable. No acute osseous or soft tissue abnormality. IMPRESSION: No acute cardiopulmonary abnormality. Electronically Signed   By: Lovena Le M.D.   On: 08/13/2020 02:14    EKG: Independently reviewed. Sinus, no st changes  Assessment/Plan COVID Infection     - admit to inpatient, tele     - decadron, remdes, inhalers, anti-tussives, O2 as necessary     - follow inflammatory markers  Hypokalemia Hypomagnesemia      - K+, Mg2+ improved after replacement in ED; continue replacement PO  Elevated LFTs     - check hepatitis panel, [place]  Lactic acidosis     - fluids, follow  Thrombocytopenia Leukopenia     - no evidence of bleed; likely related to liver disease  EtOH abuse     - CIWAA, follow     - counseled against further use  Tobacco abuse      - counseled against further use  DVT prophylaxis: lovenox  Code Status: FULL  Family Communication: None at bedside.  Consults called: None   Status is: Inpatient  Remains inpatient appropriate because:Inpatient level of care appropriate due to severity of illness   Dispo: The patient is from: Home              Anticipated d/c  is to: Home              Anticipated d/c date is: 3 days              Patient currently is not medically stable to d/c.  Jonnie Finner DO [facility]  If 7PM-7AM, please contact night-coverage [email or web address]  08/13/2020, 10:26 AM

## 2020-08-13 NOTE — ED Triage Notes (Signed)
C/o covid + x 7 days, home test, c/o SOB

## 2020-08-13 NOTE — Progress Notes (Signed)
Patient arrives from [facility] at this time via [facility]

## 2020-08-13 NOTE — Plan of Care (Signed)

## 2020-08-13 NOTE — ED Notes (Signed)
Pulse ox amb 97%

## 2020-08-13 NOTE — ED Provider Notes (Addendum)
. TIME SEEN: 1:00 AM  CHIEF COMPLAINT: Shortness of breath, cough, vomiting, diarrhea, fever  HPI: Patient is a 54 year old [name] who presents to the emergency department with complaints of fevers, vomiting, diarrhea, loss of taste and smell, dry cough, shortness of breath, chest tightness for the past 7 days.  Had a positive Covid test at home.  Did receive monoclonal antibodies yesterday.  Has had 2 COVID-19 vaccinations.  Reports last fever was yesterday.  No current nausea or vomiting.  Having some shortness of breath and chest tightness.  States his wife became concerned when his oxygen saturation was in the 80s at home.  He was not sure how long this lasted.  Oxygen saturation 97% with ambulation here.  ROS: See HPI Constitutional:  fever  Eyes: no drainage  ENT: no runny nose   Cardiovascular:   chest pain  Resp: SOB  GI:  vomiting GU: no dysuria Integumentary: no rash  Allergy: no hives  Musculoskeletal: no leg swelling  Neurological: no slurred speech ROS otherwise negative  PAST MEDICAL HISTORY/PAST SURGICAL HISTORY:  Past Medical History:  Diagnosis Date  . Alcohol addiction ([place])   . Anxiety   . Colon polyps   . Depression   . Hemochromatosis   . Hx of blood clots    Leg  . Hypertension     MEDICATIONS:  Prior to Admission medications   Medication Sig Start Date End Date Taking? Authorizing Provider  aspirin 81 MG chewable tablet Chew by mouth.    [provider]  benzonatate (TESSALON) 200 MG capsule Take 1 capsule (200 mg total) by mouth 3 (three) times daily as needed for cough. 08/07/20   [name], DO  busPIRone (BUSPAR) 10 MG tablet Take 2 tablets (20 mg total) by mouth 3 (three) times daily. 07/10/20 10/08/20  [name], DO  Cholecalciferol 50 MCG (2000 UT) CAPS Take 1 capsule by mouth daily.    [provider]  diclofenac (VOLTAREN) 75 MG EC tablet Take by mouth. Patient not taking: Reported on 07/11/2020 10/15/19   [provider]  EPINEPHrine 0.3 mg/0.3 mL IJ SOAJ injection Inject 0.3 mg into the muscle as needed for anaphylaxis. 07/08/20   [name], DO  escitalopram (LEXAPRO) 20 MG tablet Take by mouth. 03/07/20   [provider]  folic acid (FOLVITE) 1 MG tablet Take 1 tablet (1 mg total) by mouth daily. 05/29/20   [name], DO  lisinopril (ZESTRIL) 10 MG tablet Take 1 tablet (10 mg total) by mouth daily. 05/29/20 05/29/21  [name], DO  Magnesium Oxide 400 (240 Mg) MG TABS Take 240 mg by mouth.    [provider]  mirtazapine (REMERON) 15 MG tablet Take by mouth. 03/07/20   [provider]  Multiple Vitamins-Minerals (YOUR LIFE [place]) [place] by mouth.    [provider]  nicotine (NICODERM CQ - DOSED IN MG/24 HOURS) 21 mg/24hr patch APPLY 1 PATCH TO SKIN ONCE A DAY *APPLY TO NON-HAIRY, CLEAN, DRY AREA (NO TOBACCO PRODUCTS)* Patient not taking: Reported on 07/11/2020 11/14/19   [provider]  pantoprazole (PROTONIX) 40 MG tablet Take 1 tablet by mouth daily. 02/12/20 02/11/21  [provider]  predniSONE (STERAPRED UNI-PAK 21 TAB) 10 MG (21) TBPK tablet Taper as directed on packaging. Patient not taking: Reported on 07/11/2020 05/20/20   [name], DO  saccharomyces boulardii (FLORASTOR) 250 MG capsule Take 250 mg by mouth 2 (two) times daily.     [provider]  thiamine 100 MG tablet TAKE ONE TABLET BY MOUTH DAILY FOR DEFICIENCY. 11/14/19   [provider]    ALLERGIES:  Allergies  Allergen Reactions  . Charentais Melon (French Melon) Anaphylaxis  . Cucumber Extract Itching and Nausea And Vomiting    No extracts; just cucumber   . Other Anaphylaxis  . Peanut Butter Flavor Anaphylaxis and Swelling  . Peanut Oil Swelling  . Shellfish Allergy Itching and Swelling  . Cantaloupe Extract Allergy Skin Test Rash  . Lactose Other (See Comments)  . Strawberry Extract Nausea And Vomiting and Swelling   . Vancomycin Rash  . Codeine     itching  . Depakote Er [Divalproex Sodium Er]     Tongue swelling    SOCIAL HISTORY:  Social History   Tobacco Use  . Smoking status: Never Smoker  . Smokeless tobacco: Current User    Types: Snuff  Substance Use Topics  . Alcohol use: Not Currently    FAMILY HISTORY: No family history on file.  EXAM: BP 118/74   Pulse 96   Temp 98.2 F (36.8 C)   Resp 16   Ht 5' 5"  (1.651 m)   Wt 78.5 kg   SpO2 96%   BMI 28.79 kg/m  CONSTITUTIONAL: Alert and oriented and responds appropriately to questions. Well-appearing; well-nourished, nontoxic-appearing, afebrile HEAD: Normocephalic EYES: Conjunctivae clear, pupils appear equal, EOM appear intact ENT: normal nose; moist mucous membranes NECK: Supple, normal ROM CARD: RRR; S1 and S2 appreciated; no murmurs, no clicks, no rubs, no gallops RESP: Normal chest excursion without splinting or tachypnea; breath sounds clear and equal bilaterally; no wheezes, no rhonchi, no rales, no hypoxia or respiratory distress, speaking full sentences ABD/GI: Normal bowel sounds; non-distended; soft, non-tender, no rebound, no guarding, no peritoneal signs, no hepatosplenomegaly BACK:  The back appears normal EXT: Normal ROM in all joints; no deformity noted, no edema; no cyanosis SKIN: Normal color for age and race; warm; no rash on exposed skin NEURO: Moves all extremities equally PSYCH: The patient's mood and manner are appropriate.   MEDICAL DECISION MAKING: Patient here with symptoms of COVID-19 with chest tightness and shortness of breath.  Reports his wife became concerned when he had oxygen saturations in the 80s at home on pulse oximetry.  Here he is satting 95% on room air at rest and was 97% with ambulation.  No respiratory distress, increased work of breathing here.  We will continue to monitor closely.  Does complain of some chest tightness but likely due to COVID-19 infection but will obtain one troponin  and EKG.  Low suspicion for ACS, PE or dissection.  Will also obtain chest x-ray.  He has been vaccinated for COVID-19 and received a monoclonal antibody infusion yesterday.  ED PROGRESS: Labs show leukopenia, hypokalemia and hypomagnesemia.  He does have a prolonged QT interval on EKG with no old for comparison.  He reports it is normal for him to have low magnesium and potassium levels.  This could be from recent vomiting but also has a history of alcohol abuse.  Will replenish electrolytes and recheck EKG.  Troponin negative.  Chest x-ray shows no pneumonia.    Patient has had multiple episodes where his oxygen saturations will drop while sitting upright in the bed on room air at rest into the upper 80s.  Doing well on 2 L nasal cannula but given this new hypoxia, will admit.  He is not a smoker.  Denies history of asthma or COPD.   3:03 AM  Discussed patient's case with hospitalist, Dr. Hal Hope.  I have recommended admission and patient (and family if present) agree with this plan. Admitting physician will place admission orders.   I reviewed all nursing notes, vitals, pertinent previous records and reviewed/interpreted all EKGs, lab and urine results, imaging (as available).  5:49 AM  Pt's repeat EKG shows that his QT interval has improved. We will continue to monitor his electrolytes.   Date: 08/13/2020 1:22  Rate: 87  Rhythm: normal sinus rhythm  QRS Axis: normal  Intervals: Prolonged QTc interval (53m)  ST/T Wave abnormalities: normal  Conduction Disutrbances: none  Narrative Interpretation: Prolonged QT interval, no old for comparison   EKG Interpretation  Date/Time:  Wednesday August 13 2020 05:46:26 EST Ventricular Rate:  80 PR Interval:    QRS Duration: 94 QT Interval:  418 QTC Calculation: 483 R Axis:   19 Text Interpretation: Sinus rhythm Low voltage, precordial leads Borderline prolonged QT interval QT interval has improved Confirmed by WPryor [phone] on  08/13/2020 5:48:52 AM         CRITICAL CARE Performed by: KCyril MourningWard   Total critical care time: 55 minutes  Critical care time was exclusive of separately billable procedures and treating other patients.  Critical care was necessary to treat or prevent imminent or life-threatening deterioration.  Critical care was time spent personally by me on the following activities: development of treatment plan with patient and/or surrogate as well as nursing, discussions with consultants, evaluation of patient's response to treatment, examination of patient, obtaining history from patient or surrogate, ordering and performing treatments and interventions, ordering and review of laboratory studies, ordering and review of radiographic studies, pulse oximetry and re-evaluation of patient's condition.   SEmmanual Gauthreauxwas evaluated in Emergency Department on 08/13/2020 for the symptoms described in the history of present illness. He was evaluated in the context of the global COVID-19 pandemic, which necessitated consideration that the patient might be at risk for infection with the SARS-CoV-2 virus that causes COVID-19. Institutional protocols and algorithms that pertain to the evaluation of patients at risk for COVID-19 are in a state of rapid change based on information released by regulatory bodies including the CDC and federal and state organizations. These policies and algorithms were followed during the patient's care in the ED.      Nillie Bartolotta, KDelice Bison DO 08/13/20 0304    Deborra Phegley, KDelice Bison DO 08/13/20 [id number]

## 2020-08-13 NOTE — Progress Notes (Signed)
Prn tylenol given for temp of 99.6

## 2020-08-14 ENCOUNTER — Encounter: Payer: 59 | Admitting: Rehabilitative and Restorative Service Providers"

## 2020-08-14 DIAGNOSIS — F1020 Alcohol dependence, uncomplicated: Secondary | ICD-10-CM

## 2020-08-14 DIAGNOSIS — F10129 Alcohol abuse with intoxication, unspecified: Secondary | ICD-10-CM

## 2020-08-14 DIAGNOSIS — I10 Essential (primary) hypertension: Secondary | ICD-10-CM

## 2020-08-14 DIAGNOSIS — E876 Hypokalemia: Secondary | ICD-10-CM

## 2020-08-14 LAB — CBC WITH DIFFERENTIAL/PLATELET
Abs Immature Granulocytes: 0 10*3/uL (ref 0.00–0.07)
Band Neutrophils: 0 %
Basophils Absolute: 0 10*3/uL (ref 0.0–0.1)
Basophils Relative: 0 %
Blasts: 0 %
Eosinophils Absolute: 0 10*3/uL (ref 0.0–0.5)
Eosinophils Relative: 0 %
HCT: 38.6 % — ABNORMAL LOW (ref 39.0–52.0)
Hemoglobin: 13.4 g/dL (ref 13.0–17.0)
Lymphocytes Relative: 24 %
Lymphs Abs: 1.1 10*3/uL (ref 0.7–4.0)
MCH: 34.9 pg — ABNORMAL HIGH (ref 26.0–34.0)
MCHC: 34.7 g/dL (ref 30.0–36.0)
MCV: 100.5 fL — ABNORMAL HIGH (ref 80.0–100.0)
Metamyelocytes Relative: 0 %
Monocytes Absolute: 0.6 10*3/uL (ref 0.1–1.0)
Monocytes Relative: 12 %
Myelocytes: 0 %
Neutro Abs: 3 10*3/uL (ref 1.7–7.7)
Neutrophils Relative %: 64 %
Other: 0 %
Platelets: 103 10*3/uL — ABNORMAL LOW (ref 150–400)
Promyelocytes Relative: 0 %
RBC: 3.84 MIL/uL — ABNORMAL LOW (ref 4.22–5.81)
RDW: 13.2 % (ref 11.5–15.5)
WBC: 4.7 10*3/uL (ref 4.0–10.5)
nRBC: 0 % (ref 0.0–0.2)
nRBC: 0 /100 WBC

## 2020-08-14 LAB — HEPATITIS PANEL, ACUTE
HCV Ab: NONREACTIVE
Hep A IgM: NONREACTIVE
Hep B C IgM: NONREACTIVE
Hepatitis B Surface Ag: NONREACTIVE

## 2020-08-14 LAB — COMPREHENSIVE METABOLIC PANEL
ALT: 39 U/L (ref 0–44)
AST: 100 U/L — ABNORMAL HIGH (ref 15–41)
Albumin: 3.4 g/dL — ABNORMAL LOW (ref 3.5–5.0)
Alkaline Phosphatase: 87 U/L (ref 38–126)
Anion gap: 10 (ref 5–15)
BUN: 12 mg/dL (ref 6–20)
CO2: 24 mmol/L (ref 22–32)
Calcium: 8.4 mg/dL — ABNORMAL LOW (ref 8.9–10.3)
Chloride: 104 mmol/L (ref 98–111)
Creatinine, Ser: 0.75 mg/dL (ref 0.61–1.24)
GFR, Estimated: >60 mL/min (ref >60)
Glucose, Bld: 127 mg/dL — ABNORMAL HIGH (ref 70–99)
Potassium: 3.9 mmol/L (ref 3.5–5.1)
Sodium: 138 mmol/L (ref 135–145)
Total Bilirubin: 1.2 mg/dL (ref 0.3–1.2)
Total Protein: 6.8 g/dL (ref 6.5–8.1)

## 2020-08-14 LAB — HEMOGLOBIN A1C
Hgb A1c MFr Bld: 4.8 % (ref 4.8–5.6)
Mean Plasma Glucose: 91.06 mg/dL

## 2020-08-14 LAB — GLUCOSE, CAPILLARY
Glucose-Capillary: 316 mg/dL — ABNORMAL HIGH (ref 70–99)
Glucose-Capillary: 272 mg/dL — ABNORMAL HIGH (ref 70–99)
Glucose-Capillary: 232 mg/dL — ABNORMAL HIGH (ref 70–99)
Glucose-Capillary: 80 mg/dL (ref 70–99)

## 2020-08-14 LAB — D-DIMER, QUANTITATIVE: D-Dimer, Quant: 0.36 ug/mL-FEU (ref 0.00–0.50)

## 2020-08-14 LAB — C-REACTIVE PROTEIN: CRP: 1.4 mg/dL — ABNORMAL HIGH (ref <1.0)

## 2020-08-14 LAB — PHOSPHORUS: Phosphorus: <1 mg/dL — CL (ref 2.5–4.6)

## 2020-08-14 LAB — MAGNESIUM: Magnesium: 1.6 mg/dL — ABNORMAL LOW (ref 1.7–2.4)

## 2020-08-14 MED ORDER — INSULIN ASPART 100 UNIT/ML ~~LOC~~ SOLN
0.0000 [IU] | Freq: Three times a day (TID) | SUBCUTANEOUS | Status: DC
  Administered 2020-08-14: 5 [IU] via SUBCUTANEOUS
  Administered 2020-08-15 (×2): 2 [IU] via SUBCUTANEOUS

## 2020-08-14 MED ORDER — POTASSIUM CHLORIDE CRYS ER 20 MEQ PO TBCR
40.0000 meq | EXTENDED_RELEASE_TABLET | Freq: Every day | ORAL | Status: DC
  Administered 2020-08-14 – 2020-08-16 (×3): 40 meq via ORAL
  Filled 2020-08-14 (×3): qty 2

## 2020-08-14 MED ORDER — DEXAMETHASONE SODIUM PHOSPHATE 4 MG/ML IJ SOLN
4.0000 mg | INTRAMUSCULAR | Status: DC
  Administered 2020-08-15: 4 mg via INTRAVENOUS
  Filled 2020-08-14: qty 1

## 2020-08-14 MED ORDER — MAGNESIUM SULFATE 2 GM/50ML IV SOLN
2.0000 g | Freq: Once | INTRAVENOUS | Status: AC
  Administered 2020-08-14: 2 g via INTRAVENOUS
  Filled 2020-08-14: qty 50

## 2020-08-14 MED ORDER — K PHOS MONO-SOD PHOS DI & MONO 155-852-130 MG PO TABS
500.0000 mg | ORAL_TABLET | Freq: Four times a day (QID) | ORAL | Status: DC
  Administered 2020-08-14 – 2020-08-16 (×8): 500 mg via ORAL
  Filled 2020-08-14 (×8): qty 2

## 2020-08-14 MED ORDER — LINAGLIPTIN 5 MG PO TABS
5.0000 mg | ORAL_TABLET | Freq: Every day | ORAL | Status: DC
  Administered 2020-08-14 – 2020-08-16 (×3): 5 mg via ORAL
  Filled 2020-08-14 (×3): qty 1

## 2020-08-14 MED ORDER — INSULIN DETEMIR 100 UNIT/ML ~~LOC~~ SOLN
10.0000 [IU] | Freq: Two times a day (BID) | SUBCUTANEOUS | Status: DC
  Administered 2020-08-14 (×2): 10 [IU] via SUBCUTANEOUS
  Filled 2020-08-14 (×3): qty 0.1

## 2020-08-14 MED ORDER — INSULIN ASPART 100 UNIT/ML ~~LOC~~ SOLN: 0.0000 [IU] | Freq: Every day | SUBCUTANEOUS | Status: DC

## 2020-08-14 NOTE — Progress Notes (Signed)
Patient and NT reported that pt had blood in his urine. Nurse went in to assess pt, and pt stated that he had a very small amount of blood in his urine. Nurse did not see urine at this time, because NT flushed  it out before reporting to nurse. NT and pt were both told not to flush toilet after urinating. Provider was notified, will continue to monitor. Shirlyn Goltz

## 2020-08-14 NOTE — Progress Notes (Signed)
PROGRESS NOTE  Juan [name]  URK:[id number] DOB: Feb 21, 1966 DOA: 08/13/2020 PCP: Juan Nutting, DO   Brief Narrative: Juan [name] is a 54 y.o. male with a history of alcoholism with recent relapse, HTN, anxiety, and recent diagnosis of covid-19 (12/15 by home test) s/p bamlanivimab/etesevimab 12/20 who presented to the ED 12/22 when home pulse oximetry showed SpO2 down to 82%. He was confirmed to be mildly hypoxic, though CXR revealed no infiltrates, and CRP only modestly elevated. He was admitted to [facility] for acute hypoxic respiratory failure due to covid-19 infection and alcohol intoxication with concern for withdrawal, started on remdesivir and decadron as well as CIWA protocol.  Assessment & Plan: Active Problems:   COVID-19 virus infection  Acute hypoxemic respiratory failure due to breakthrough covid-19 pneumonia: SARS-CoV-2 Ag positive on 12/15, s/p Pfizer vaccines in March, April 2021. Received bamlanivimab/etesevimab PTA though this may have less efficacy against omicron variant.  - Continue remdesivir - Will plan to taper steroids aggressively with severe hyperglycemia. - Wean oxygen as tolerated - Encourage OOB, IS, FV, and awake proning if able - Continue airborne, contact precautions for 21 days from positive testing. - Monitor CMP and inflammatory markers - Enoxaparin prophylactic dose.   Alcoholism with recent recurrence: the patient reports he last drank a large tumbler of bourbon on Friday, though level of EtOH in the ED was 176 on 12/22.  - Continue CIWA protocol, requiring ativan but not escalating doses at this time. Never had DTs. - MVM - Cessation counseling provided. Pt has reestablished communication with his sponsor and will return to [place] after discharge.  Hypomagnesemia, hypophosphatemia, hypokalemia:  - Supplement aggressively and monitor closely  Steroid-induced hyperglycemia: Severe, abrupt rise in CBGs.  - Start levemir 10u BID, novolog moderate SSI  (insulin-naive) and HS correction.  - Check HbA1c.   Anxiety:  - Continue medications  HTN:  - Continue lisinopril, ASA    DVT prophylaxis: Lovenox Code Status: Full Family Communication: None at bedside, declined offer to call Disposition Plan:  Status is: Inpatient  Remains inpatient appropriate because:Persistent severe electrolyte disturbances and Inpatient level of care appropriate due to severity of illness  Dispo: The patient is from: Home              Anticipated d/c is to: Home              Anticipated d/c date is: 2 days              Patient currently is not medically stable to d/c.  Consultants:   None  Procedures:   None  Antimicrobials:  Remdesivir   Subjective: No shortness of breath at rest, hasn't gotten up yet, but didn't have shortness of breath even when pulse oximetry showed levels in low 80%'s. No chest pain or other complaints. Does feel anxious but not like he's withdrawing from alcohol.  Objective: Vitals:   08/13/20 1515 08/13/20 1849 08/13/20 2337 08/14/20 0605  BP: 100/86 109/77 (!) 126/91 112/78  Pulse: 84 81 65 63  Resp: 20 (!) 24 18   Temp: 99.6 F (37.6 C) (!) 97.5 F (36.4 C) 98 F (36.7 C)   TempSrc: Oral Oral    SpO2: 97% 96% 97% 95%  Weight:      Height:        Intake/Output Summary (Last 24 hours) at 08/14/2020 1340 Last data filed at 08/14/2020 1010 Gross per 24 hour  Intake 476.28 ml  Output 275 ml  Net 201.28 ml   [employer]  08/13/20 0054  Weight: 78.5 kg    Gen: 54 y.o. male in no distress, a bit diaphoretic Pulm: Non-labored breathing with supplemental oxygen, clear.  CV: Regular rate and rhythm. No murmur, rub, or gallop. No JVD, no pedal edema. GI: Abdomen soft, non-tender, non-distended, with normoactive bowel sounds. No organomegaly or masses felt. Ext: Warm, no deformities Skin: No rashes, lesions or ulcers on visualized skin Neuro: Alert and oriented. No focal neurological deficits. Psych:  Judgement and insight appear normal. Mood & affect appropriate.   Data Reviewed: I have personally reviewed following labs and imaging studies  CBC: Recent Labs  Lab 08/13/20 0155 08/13/20 1152 08/14/20 0358  WBC 3.4* 2.0* 4.7  NEUTROABS 1.2*  --  3.0  HGB 13.8 13.8 13.4  HCT 37.5* 39.0 38.6*  MCV 96.9 100.0 100.5*  PLT 107* 96* 937*   Basic Metabolic Panel: Recent Labs  Lab 08/13/20 0125 08/13/20 0751 08/13/20 1152 08/14/20 0358  NA 137  --   --  138  K 2.9* 3.8  --  3.9  CL 99  --   --  104  CO2 21*  --   --  24  GLUCOSE 92  --   --  127*  BUN 10  --   --  12  CREATININE 0.74  --  0.68 0.75  CALCIUM 9.6  --   --  8.4*  MG 1.5* 1.6*  --  1.6*  PHOS  --   --   --  <1.0*   GFR: Estimated Creatinine Clearance: 104.3 mL/min (by C-G formula based on SCr of 0.75 mg/dL). Liver Function Tests: Recent Labs  Lab 08/13/20 0320 08/14/20 0358  AST 160* 100*  ALT 50* 39  ALKPHOS 99 87  BILITOT 1.1 1.2  PROT 6.9 6.8  ALBUMIN 3.7 3.4*   No results for input(s): LIPASE, AMYLASE in the last 168 hours. No results for input(s): AMMONIA in the last 168 hours. Coagulation Profile: No results for input(s): INR, PROTIME in the last 168 hours. Cardiac Enzymes: No results for input(s): CKTOTAL, CKMB, CKMBINDEX, TROPONINI in the last 168 hours. BNP (last 3 results) No results for input(s): PROBNP in the last 8760 hours. HbA1C: No results for input(s): HGBA1C in the last 72 hours. CBG: Recent Labs  Lab 08/14/20 1212  GLUCAP 316*   Lipid Profile: Recent Labs    08/13/20 0320  TRIG 152*   Thyroid Function Tests: No results for input(s): TSH, T4TOTAL, FREET4, T3FREE, THYROIDAB in the last 72 hours. Anemia Panel: Recent Labs    08/13/20 0320  FERRITIN 190   Urine analysis: No results found for: COLORURINE, APPEARANCEUR, LABSPEC, PHURINE, GLUCOSEU, HGBUR, BILIRUBINUR, KETONESUR, PROTEINUR, UROBILINOGEN, NITRITE, LEUKOCYTESUR Recent Results (from the past 240 hour(s))   Resp Panel by RT-PCR (Flu A&B, Covid) Nasopharyngeal Swab     Status: Abnormal   Collection Time: 08/13/20 12:57 AM   Specimen: Nasopharyngeal Swab; Nasopharyngeal(NP) swabs in vial transport medium  Result Value Ref Range Status   SARS Coronavirus 2 by RT PCR POSITIVE (A) NEGATIVE Final    Comment: RESULT CALLED TO, READ BACK BY AND VERIFIED WITH: NEAL,K AT 0140 ON [id number] BY CHERESNOWSKY,T (NOTE) SARS-CoV-2 target nucleic acids are DETECTED.  The SARS-CoV-2 RNA is generally detectable in upper respiratory specimens during the acute phase of infection. Positive results are indicative of the presence of the identified virus, but do not rule out bacterial infection or co-infection with other pathogens not detected by the test. Clinical correlation with patient history and other  diagnostic information is necessary to determine patient infection status. The expected result is Negative.  Fact Sheet for Patients: EntrepreneurPulse.com.au  Fact Sheet for Healthcare Providers: IncredibleEmployment.be  This test is not yet approved or cleared by the [place] FDA and  has been authorized for detection and/or diagnosis of SARS-CoV-2 by FDA under an Emergency Use Authorization (EUA).  This EUA will remain in effect (meaning this te st can be used) for the duration of  the COVID-19 declaration under Section 564(b)(1) of the Act, 21 U.S.C. section 360bbb-3(b)(1), unless the authorization is terminated or revoked sooner.     Influenza A by PCR NEGATIVE NEGATIVE Final   Influenza B by PCR NEGATIVE NEGATIVE Final    Comment: (NOTE) The Xpert Xpress SARS-CoV-2/FLU/RSV plus assay is intended as an aid in the diagnosis of influenza from Nasopharyngeal swab specimens and should not be used as a sole basis for treatment. Nasal washings and aspirates are unacceptable for Xpert Xpress SARS-CoV-2/FLU/RSV testing.  Fact Sheet for  Patients: EntrepreneurPulse.com.au  Fact Sheet for Healthcare Providers: IncredibleEmployment.be  This test is not yet approved or cleared by the [place] FDA and has been authorized for detection and/or diagnosis of SARS-CoV-2 by FDA under an Emergency Use Authorization (EUA). This EUA will remain in effect (meaning this test can be used) for the duration of the COVID-19 declaration under Section 564(b)(1) of the Act, 21 U.S.C. section 360bbb-3(b)(1), unless the authorization is terminated or revoked.  Performed at [facility], [address] [place], [place] [address]   Blood Culture (routine x 2)     Status: None (Preliminary result)   Collection Time: 08/13/20  3:15 AM   Specimen: BLOOD  Result Value Ref Range Status   Specimen Description   Final    BLOOD LEFT ANTECUBITAL Performed at [facility], [address] [place], [place] [address]    Special Requests   Final    BOTTLES DRAWN AEROBIC AND ANAEROBIC Blood Culture adequate volume Performed at [facility], [address], [place], [place] [address]    Culture   Final    NO GROWTH 1 DAY Performed at [facility], [place] [address], [place], [place] [address]    Report Status PENDING  Incomplete  Blood Culture (routine x 2)     Status: None (Preliminary result)   Collection Time: 08/13/20  3:35 AM   Specimen: BLOOD LEFT WRIST  Result Value Ref Range Status   Specimen Description   Final    BLOOD LEFT WRIST Performed at [facility], [address] [place], [place] [address]    Special Requests   Final    BOTTLES DRAWN AEROBIC AND ANAEROBIC Blood Culture adequate volume Performed at [facility] [address] [place], [place] [address]    Culture   Final    NO GROWTH 1 DAY Performed at [facility], [place] [address], [place], [place] [address]    Report Status PENDING  Incomplete      Radiology  Studies: DG Chest Portable 1 View  Result Date: 08/13/2020 CLINICAL DATA:  COVID-19 positive, shortness of breath EXAM: PORTABLE CHEST 1 VIEW COMPARISON:  08/08/2019 FINDINGS: No consolidation, features of edema, pneumothorax, or effusion. Pulmonary vascularity is normally distributed. The cardiomediastinal contours are unremarkable. No acute osseous or soft tissue abnormality. IMPRESSION: No acute cardiopulmonary abnormality. Electronically Signed   By: Lovena Le M.D.   On: 08/13/2020  02:14   US Abdomen Limited RUQ (LIVER/GB)  Result Date: 08/13/2020 CLINICAL DATA:  Elevated liver function test. History of positive COVID. EXAM: ULTRASOUND ABDOMEN LIMITED RIGHT UPPER QUADRANT COMPARISON:  CT abdomen pelvis 06/23/2020. ultrasound abdomen 688 FINDINGS: Gallbladder: No gallstones or wall thickening visualized. No sonographic Murphy sign noted by sonographer. Common bile duct: Diameter: 4 mm Liver: No focal lesion identified. Increased parenchymal echogenicity. Portal vein is patent on color Doppler imaging with normal direction of blood flow towards the liver. Other: None. IMPRESSION: Hepatic steatosis. Please note limited evaluation for focal hepatic masses in a patient with hepatic steatosis due to decreased penetration of the acoustic ultrasound waves. Electronically Signed   By: Iven Finn M.D.   On: 08/13/2020 22:30    Scheduled Meds: . vitamin C  500 mg Oral Daily  . aspirin  81 mg Oral Daily  . busPIRone  20 mg Oral TID  . dexamethasone (DECADRON) injection  6 mg Intravenous Q24H  . enoxaparin (LOVENOX) injection  40 mg Subcutaneous Q24H  . escitalopram  20 mg Oral Daily  . folic acid  1 mg Oral Daily  . insulin aspart  0-15 Units Subcutaneous TID WC  . insulin aspart  0-5 Units Subcutaneous QHS  . insulin detemir  10 Units Subcutaneous BID  . Ipratropium-Albuterol  1 puff Inhalation Q6H  . linagliptin  5 mg Oral Daily  . lisinopril  10 mg Oral Daily  . LORazepam  0-4 mg  Intravenous Q6H   Or  . LORazepam  0-4 mg Oral Q6H  . [START ON 08/15/2020] LORazepam  0-4 mg Intravenous Q12H   Or  . [START ON 08/15/2020] LORazepam  0-4 mg Oral Q12H  . magnesium oxide  400 mg Oral BID  . mirtazapine  15 mg Oral QHS  . multivitamin with minerals  1 tablet Oral Daily  . pantoprazole  40 mg Oral Daily  . phosphorus  500 mg Oral QID  . potassium chloride  40 mEq Oral Daily  . thiamine  100 mg Oral Daily   Or  . thiamine  100 mg Intravenous Daily  . zinc sulfate  220 mg Oral Daily   Continuous Infusions: . sodium chloride Stopped (08/13/20 0541)  . sodium chloride Stopped (08/13/20 0508)  . remdesivir 100 mg in NS 100 mL       LOS: 1 day   Time spent: 35 minutes.  Patrecia Pour, MD [facility] [email or web address] 08/14/2020, 1:40 PM

## 2020-08-14 NOTE — Progress Notes (Signed)
Inpatient Diabetes Program Recommendations  AACE/ADA: New Consensus Statement on Inpatient Glycemic Control (2015)  Target Ranges:  Prepandial:   less than 140 mg/dL      Peak postprandial:   less than 180 mg/dL (1-2 hours)      Critically ill patients:  140 - 180 mg/dL   Lab Results  Component Value Date   GLUCAP 316 (H) 08/14/2020    Review of Glycemic Control Results for Juan [name], WHITTER (MRN [id number]) as of 08/14/2020 12:29  Ref. Range 08/14/2020 12:12  Glucose-Capillary Latest Ref Range: 70 - 99 mg/dL 316 (H)   Diabetes history: None Current orders for Inpatient glycemic control:  Decadron 6 mg IV q 24 hours Inpatient Diabetes Program Recommendations:   While on steroids, please add Novolog moderate tid with meals and HS.   Thanks  Adah Perl, RN, BC-ADM Inpatient Diabetes Coordinator Pager [phone] (8a-5p)

## 2020-08-15 LAB — COMPREHENSIVE METABOLIC PANEL
ALT: 34 U/L (ref 0–44)
AST: 80 U/L — ABNORMAL HIGH (ref 15–41)
Albumin: 3.2 g/dL — ABNORMAL LOW (ref 3.5–5.0)
Alkaline Phosphatase: 82 U/L (ref 38–126)
Anion gap: 11 (ref 5–15)
BUN: 14 mg/dL (ref 6–20)
CO2: 25 mmol/L (ref 22–32)
Calcium: 8 mg/dL — ABNORMAL LOW (ref 8.9–10.3)
Chloride: 105 mmol/L (ref 98–111)
Creatinine, Ser: 0.83 mg/dL (ref 0.61–1.24)
GFR, Estimated: >60 mL/min (ref >60)
Glucose, Bld: 117 mg/dL — ABNORMAL HIGH (ref 70–99)
Potassium: 3.6 mmol/L (ref 3.5–5.1)
Sodium: 141 mmol/L (ref 135–145)
Total Bilirubin: 1.1 mg/dL (ref 0.3–1.2)
Total Protein: 6.6 g/dL (ref 6.5–8.1)

## 2020-08-15 LAB — MAGNESIUM: Magnesium: 2 mg/dL (ref 1.7–2.4)

## 2020-08-15 LAB — CBC WITH DIFFERENTIAL/PLATELET
Abs Immature Granulocytes: 0.04 10*3/uL (ref 0.00–0.07)
Basophils Absolute: 0 10*3/uL (ref 0.0–0.1)
Basophils Relative: 0 %
Eosinophils Absolute: 0 10*3/uL (ref 0.0–0.5)
Eosinophils Relative: 0 %
HCT: 38 % — ABNORMAL LOW (ref 39.0–52.0)
Hemoglobin: 13.3 g/dL (ref 13.0–17.0)
Immature Granulocytes: 1 %
Lymphocytes Relative: 16 %
Lymphs Abs: 1.3 10*3/uL (ref 0.7–4.0)
MCH: 35.4 pg — ABNORMAL HIGH (ref 26.0–34.0)
MCHC: 35 g/dL (ref 30.0–36.0)
MCV: 101.1 fL — ABNORMAL HIGH (ref 80.0–100.0)
Monocytes Absolute: 0.9 10*3/uL (ref 0.1–1.0)
Monocytes Relative: 11 %
Neutro Abs: 6 10*3/uL (ref 1.7–7.7)
Neutrophils Relative %: 72 %
Platelets: 108 10*3/uL — ABNORMAL LOW (ref 150–400)
RBC: 3.76 MIL/uL — ABNORMAL LOW (ref 4.22–5.81)
RDW: 13.4 % (ref 11.5–15.5)
WBC: 8.3 10*3/uL (ref 4.0–10.5)
nRBC: 0 % (ref 0.0–0.2)

## 2020-08-15 LAB — PHOSPHORUS: Phosphorus: 1.8 mg/dL — ABNORMAL LOW (ref 2.5–4.6)

## 2020-08-15 LAB — D-DIMER, QUANTITATIVE: D-Dimer, Quant: 0.77 ug/mL-FEU — ABNORMAL HIGH (ref 0.00–0.50)

## 2020-08-15 LAB — GLUCOSE, CAPILLARY
Glucose-Capillary: 118 mg/dL — ABNORMAL HIGH (ref 70–99)
Glucose-Capillary: 131 mg/dL — ABNORMAL HIGH (ref 70–99)
Glucose-Capillary: 143 mg/dL — ABNORMAL HIGH (ref 70–99)
Glucose-Capillary: 124 mg/dL — ABNORMAL HIGH (ref 70–99)

## 2020-08-15 LAB — C-REACTIVE PROTEIN: CRP: 0.9 mg/dL (ref <1.0)

## 2020-08-15 NOTE — Plan of Care (Signed)
  Problem: Education: Goal: Knowledge of risk factors and measures for prevention of condition will improve Outcome: Progressing   Problem: Coping: Goal: Psychosocial and spiritual needs will be supported Outcome: Progressing   Problem: Respiratory: Goal: Will maintain a patent airway Outcome: Progressing Goal: Complications related to the disease process, condition or treatment will be avoided or minimized Outcome: Progressing   Problem: Clinical Measurements: Goal: Respiratory complications will improve Outcome: Progressing Goal: Cardiovascular complication will be avoided Outcome: Progressing

## 2020-08-15 NOTE — Plan of Care (Signed)
  Problem: Coping: Goal: Level of anxiety will decrease Outcome: Progressing   Problem: Safety: Goal: Ability to remain free from injury will improve Outcome: Progressing   Problem: Skin Integrity: Goal: Risk for impaired skin integrity will decrease Outcome: Progressing

## 2020-08-15 NOTE — Progress Notes (Addendum)
PROGRESS NOTE  Juan [name]  RCB:[id number] DOB: [birth year] DOA: 08/13/2020 PCP: [name], DO   Brief Narrative: Juan [name] is a 54 y.o. male with a history of alcoholism with recent relapse, HTN, anxiety, and recent diagnosis of covid-19 (12/15 by home test) s/p bamlanivimab/etesevimab 12/20 who presented to the ED 12/22 when home pulse oximetry showed SpO2 down to 82%. He was confirmed to be mildly hypoxic, though CXR revealed no infiltrates, and CRP only modestly elevated. He was admitted to [facility] for acute hypoxic respiratory failure due to covid-19 infection and alcohol intoxication with concern for withdrawal, started on remdesivir and decadron as well as CIWA protocol.  Assessment & Plan: Active Problems:   COVID-19 virus infection  Acute hypoxemic respiratory failure due to breakthrough covid-19 pneumonia: SARS-CoV-2 Ag positive on 12/15, s/p Pfizer vaccines in March, April 2021. Received bamlanivimab/etesevimab PTA though this may have less efficacy against omicron variant.  - Continue remdesivir while admitted - Tapered steroids this AM due to hyperglycemia. Will wean to room air and if maintains saturations, DC steroids.  - Encourage OOB, IS, FV, and awake proning if able - Continue airborne, contact precautions for 21 days from positive testing. - Monitor CMP and inflammatory markers - Enoxaparin prophylactic dose. 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  Alcoholism with recent recurrence: The patient reports he last drank a large tumbler of bourbon on Friday, though level of EtOH in the ED was 176 on 12/22.  - Continue CIWA protocol, scores have been between zero and 6, overall improving.   - MVM - Cessation counseling provided. Pt has  reestablished communication with his sponsor and will return to [place] after discharge.  Hypomagnesemia, hypophosphatemia, hypokalemia:  - Supplement again mag and phos today  AST elevation: Improving.   Steroid-induced hyperglycemia: Severe, abrupt rise in CBGs.  - Started levemir 10u BID, novolog moderate SSI (insulin-naive) and HS correction. with improved control. Will stop basal since HbA1c is 4.8% and steroids are being tapered.  - Preferably would DC off steroids and therefore without further antihyperglycemic agents.   Anxiety:  - Continue medications  HTN:  - Continue lisinopril, ASA    DVT prophylaxis: Lovenox Code Status: Full Family Communication: None at bedside, spoke with wife by speaker phone at bedside Disposition Plan:  Status is: Inpatient  Remains inpatient appropriate because:Persistent severe electrolyte disturbances and Inpatient level of care appropriate due to severity of illness  Dispo: The patient is from: Home              Anticipated d/c is to: Home              Anticipated d/c date is: 1 day              Patient currently is not medically stable to d/c.  Consultants:   None  Procedures:   None  Antimicrobials:  Remdesivir   Subjective: Dyspnea much improved, still some cough, getting up OOB frequently. Noticed some blood in urine but no other symptoms. No chest pain No leg swelling or tenderness.   Objective: Vitals:   08/14/20 1824 08/14/20 2039 08/14/20 2326 08/15/20 0426  BP: 99/65 109/75 110/78 117/87  Pulse: 72 77 68 63  Resp: 20 15  17   Temp: 97.8 F (36.6 C) 98.3 F (36.8 C)  98.5 F (36.9  C)  TempSrc:  Oral  Oral  SpO2: 94% 95% 96% 95%  Weight:      Height:        Intake/Output Summary (Last 24 hours) at 08/15/2020 0858 Last data filed at 08/14/2020 1517 Gross per 24 hour  Intake 682 ml  Output 550 ml  Net 132 ml   Filed Weights   08/13/20 0054  Weight: 78.5 kg   Gen: 54 y.o. male in no distress Pulm:  Nonlabored breathing, crackles improved bilaterally. CV: Regular rate and rhythm. No murmur, rub, or gallop. No JVD, no dependent edema. GI: Abdomen soft, non-tender, non-distended, with normoactive bowel sounds.  Ext: Warm, no deformities Skin: No rashes, lesions or ulcers on visualized skin. Neuro: Alert and oriented. No focal neurological deficits. Mild diffuse tremor Psych: Judgement and insight appear fair. Mood euthymic & affect congruent. Behavior is appropriate.    Data Reviewed: I have personally reviewed following labs and imaging studies  CBC: Recent Labs  Lab 08/13/20 0155 08/13/20 1152 08/14/20 0358 08/15/20 0356  WBC 3.4* 2.0* 4.7 8.3  NEUTROABS 1.2*  --  3.0 6.0  HGB 13.8 13.8 13.4 13.3  HCT 37.5* 39.0 38.6* 38.0*  MCV 96.9 100.0 100.5* 101.1*  PLT 107* 96* 103* 789*   Basic Metabolic Panel: Recent Labs  Lab 08/13/20 0125 08/13/20 0751 08/13/20 1152 08/14/20 0358 08/15/20 0356  NA 137  --   --  138 141  K 2.9* 3.8  --  3.9 3.6  CL 99  --   --  104 105  CO2 21*  --   --  24 25  GLUCOSE 92  --   --  127* 117*  BUN 10  --   --  12 14  CREATININE 0.74  --  0.68 0.75 0.83  CALCIUM 9.6  --   --  8.4* 8.0*  MG 1.5* 1.6*  --  1.6* 2.0  PHOS  --   --   --  <1.0* 1.8*   GFR: Estimated Creatinine Clearance: 100.6 mL/min (by C-G formula based on SCr of 0.83 mg/dL). Liver Function Tests: Recent Labs  Lab 08/13/20 0320 08/14/20 0358 08/15/20 0356  AST 160* 100* 80*  ALT 50* 39 34  ALKPHOS 99 87 82  BILITOT 1.1 1.2 1.1  PROT 6.9 6.8 6.6  ALBUMIN 3.7 3.4* 3.2*   No results for input(s): LIPASE, AMYLASE in the last 168 hours. No results for input(s): AMMONIA in the last 168 hours. Coagulation Profile: No results for input(s): INR, PROTIME in the last 168 hours. Cardiac Enzymes: No results for input(s): CKTOTAL, CKMB, CKMBINDEX, TROPONINI in the last 168 hours. BNP (last 3 results) No results for input(s): PROBNP in the last 8760 hours. HbA1C: Recent  Labs    08/14/20 0358  HGBA1C 4.8   CBG: Recent Labs  Lab 08/14/20 1212 08/14/20 1430 08/14/20 1646 08/14/20 2135 08/15/20 0729  GLUCAP 316* 272* 232* 80 118*   Lipid Profile: Recent Labs    08/13/20 0320  TRIG 152*   Thyroid Function Tests: No results for input(s): TSH, T4TOTAL, FREET4, T3FREE, THYROIDAB in the last 72 hours. Anemia Panel: Recent Labs    08/13/20 0320  FERRITIN 190   Urine analysis: No results found for: COLORURINE, APPEARANCEUR, LABSPEC, PHURINE, GLUCOSEU, HGBUR, BILIRUBINUR, KETONESUR, PROTEINUR, UROBILINOGEN, NITRITE, LEUKOCYTESUR Recent Results (from the past 240 hour(s))  Resp Panel by RT-PCR (Flu A&B, Covid) Nasopharyngeal Swab     Status: Abnormal   Collection Time: 08/13/20 12:57 AM   Specimen: Nasopharyngeal  Swab; Nasopharyngeal(NP) swabs in vial transport medium  Result Value Ref Range Status   SARS Coronavirus 2 by RT PCR POSITIVE (A) NEGATIVE Final    Comment: RESULT CALLED TO, READ BACK BY AND VERIFIED WITH: NEAL,K AT 0140 ON [id number] BY CHERESNOWSKY,T (NOTE) SARS-CoV-2 target nucleic acids are DETECTED.  The SARS-CoV-2 RNA is generally detectable in upper respiratory specimens during the acute phase of infection. Positive results are indicative of the presence of the identified virus, but do not rule out bacterial infection or co-infection with other pathogens not detected by the test. Clinical correlation with patient history and other diagnostic information is necessary to determine patient infection status. The expected result is Negative.  Fact Sheet for Patients: EntrepreneurPulse.com.au  Fact Sheet for Healthcare Providers: IncredibleEmployment.be  This test is not yet approved or cleared by the [place] FDA and  has been authorized for detection and/or diagnosis of SARS-CoV-2 by FDA under an Emergency Use Authorization (EUA).  This EUA will remain in effect (meaning this te st can  be used) for the duration of  the COVID-19 declaration under Section 564(b)(1) of the Act, 21 U.S.C. section 360bbb-3(b)(1), unless the authorization is terminated or revoked sooner.     Influenza A by PCR NEGATIVE NEGATIVE Final   Influenza B by PCR NEGATIVE NEGATIVE Final    Comment: (NOTE) The Xpert Xpress SARS-CoV-2/FLU/RSV plus assay is intended as an aid in the diagnosis of influenza from Nasopharyngeal swab specimens and should not be used as a sole basis for treatment. Nasal washings and aspirates are unacceptable for Xpert Xpress SARS-CoV-2/FLU/RSV testing.  Fact Sheet for Patients: EntrepreneurPulse.com.au  Fact Sheet for Healthcare Providers: IncredibleEmployment.be  This test is not yet approved or cleared by the [place] FDA and has been authorized for detection and/or diagnosis of SARS-CoV-2 by FDA under an Emergency Use Authorization (EUA). This EUA will remain in effect (meaning this test can be used) for the duration of the COVID-19 declaration under Section 564(b)(1) of the Act, 21 U.S.C. section 360bbb-3(b)(1), unless the authorization is terminated or revoked.  Performed at [facility], [address] [place], [place] [address]   Blood Culture (routine x 2)     Status: None (Preliminary result)   Collection Time: 08/13/20  3:15 AM   Specimen: BLOOD  Result Value Ref Range Status   Specimen Description   Final    BLOOD LEFT ANTECUBITAL Performed at [facility], [address] [place], [place] [address]    Special Requests   Final    BOTTLES DRAWN AEROBIC AND ANAEROBIC Blood Culture adequate volume Performed at [facility], [address] [place], [place] [address]    Culture   Final    NO GROWTH 1 DAY Performed at [facility], [place] [address] [place], [place] [address]    Report Status PENDING  Incomplete  Blood Culture (routine x 2)     Status: None (Preliminary  result)   Collection Time: 08/13/20  3:35 AM   Specimen: BLOOD LEFT WRIST  Result Value Ref Range Status   Specimen Description   Final    BLOOD LEFT WRIST Performed at [facility], [address] [place], [place] [address]    Special Requests   Final    BOTTLES DRAWN AEROBIC AND ANAEROBIC Blood Culture adequate volume Performed at [facility], [address], [place], [place] [address]    Culture   Final  NO GROWTH 1 DAY Performed at [facility], [place] [address] [place], [place] [address]    Report Status PENDING  Incomplete      Radiology Studies: US Abdomen Limited RUQ (LIVER/GB)  Result Date: 08/13/2020 CLINICAL DATA:  Elevated liver function test. History of positive COVID. EXAM: ULTRASOUND ABDOMEN LIMITED RIGHT UPPER QUADRANT COMPARISON:  CT abdomen pelvis 06/23/2020. ultrasound abdomen 688 FINDINGS: Gallbladder: No gallstones or wall thickening visualized. No sonographic Murphy sign noted by sonographer. Common bile duct: Diameter: 4 mm Liver: No focal lesion identified. Increased parenchymal echogenicity. Portal vein is patent on color Doppler imaging with normal direction of blood flow towards the liver. Other: None. IMPRESSION: Hepatic steatosis. Please note limited evaluation for focal hepatic masses in a patient with hepatic steatosis due to decreased penetration of the acoustic ultrasound waves. Electronically Signed   By: Iven Finn M.D.   On: 08/13/2020 22:30    Scheduled Meds: . vitamin C  500 mg Oral Daily  . aspirin  81 mg Oral Daily  . busPIRone  20 mg Oral TID  . dexamethasone (DECADRON) injection  4 mg Intravenous Q24H  . enoxaparin (LOVENOX) injection  40 mg Subcutaneous Q24H  . escitalopram  20 mg Oral Daily  . folic acid  1 mg Oral Daily  . insulin aspart  0-15 Units Subcutaneous TID WC  . insulin aspart  0-5 Units Subcutaneous QHS  . insulin detemir  10 Units Subcutaneous BID  . Ipratropium-Albuterol  1 puff  Inhalation Q6H  . linagliptin  5 mg Oral Daily  . lisinopril  10 mg Oral Daily  . LORazepam  0-4 mg Intravenous Q12H   Or  . LORazepam  0-4 mg Oral Q12H  . magnesium oxide  400 mg Oral BID  . mirtazapine  15 mg Oral QHS  . multivitamin with minerals  1 tablet Oral Daily  . pantoprazole  40 mg Oral Daily  . phosphorus  500 mg Oral QID  . potassium chloride  40 mEq Oral Daily  . thiamine  100 mg Oral Daily   Or  . thiamine  100 mg Intravenous Daily  . zinc sulfate  220 mg Oral Daily   Continuous Infusions: . sodium chloride Stopped (08/13/20 0541)  . sodium chloride Stopped (08/13/20 0508)  . remdesivir 100 mg in NS 100 mL 100 mg (08/14/20 1344)     LOS: 2 days   Time spent: 35 minutes.  Patrecia Pour, MD [facility] [email or web address] 08/15/2020, 8:58 AM

## 2020-08-16 LAB — COMPREHENSIVE METABOLIC PANEL
ALT: 33 U/L (ref 0–44)
AST: 68 U/L — ABNORMAL HIGH (ref 15–41)
Albumin: 3.1 g/dL — ABNORMAL LOW (ref 3.5–5.0)
Alkaline Phosphatase: 79 U/L (ref 38–126)
Anion gap: 12 (ref 5–15)
BUN: 16 mg/dL (ref 6–20)
CO2: 26 mmol/L (ref 22–32)
Calcium: 7.8 mg/dL — ABNORMAL LOW (ref 8.9–10.3)
Chloride: 104 mmol/L (ref 98–111)
Creatinine, Ser: 0.85 mg/dL (ref 0.61–1.24)
GFR, Estimated: >60 mL/min (ref >60)
Glucose, Bld: 103 mg/dL — ABNORMAL HIGH (ref 70–99)
Potassium: 3.6 mmol/L (ref 3.5–5.1)
Sodium: 142 mmol/L (ref 135–145)
Total Bilirubin: 1.1 mg/dL (ref 0.3–1.2)
Total Protein: 6.2 g/dL — ABNORMAL LOW (ref 6.5–8.1)

## 2020-08-16 LAB — PHOSPHORUS: Phosphorus: 2.8 mg/dL (ref 2.5–4.6)

## 2020-08-16 LAB — GLUCOSE, CAPILLARY
Glucose-Capillary: 90 mg/dL (ref 70–99)
Glucose-Capillary: 119 mg/dL — ABNORMAL HIGH (ref 70–99)

## 2020-08-16 LAB — D-DIMER, QUANTITATIVE: D-Dimer, Quant: 0.48 ug/mL-FEU (ref 0.00–0.50)

## 2020-08-16 NOTE — Progress Notes (Signed)
Patient will be given discharge instructions.  Left Peripheral IV has been discontinued; Telemetry has been discontinued.  Will continue to monitor.  Patient will is waiting for private vehicle to transport him to private residence.

## 2020-08-16 NOTE — Progress Notes (Signed)
Patient ambulated around entire room twice on RA.  Sats ranged between 95-98%. No SOB; No Wheezing noted on ambulation.  Dr. Natale Milch has been notified.  Will continue to monitor.

## 2020-08-16 NOTE — Discharge Summary (Signed)
Physician Discharge Summary  Jomarion Mish QZE:[id number] DOB: Jul 06, 1966 DOA: 08/13/2020  PCP: [name], DO  Admit date: 08/13/2020 Discharge date: 08/16/2020  Admitted From: Home Disposition: Home  Recommendations for Outpatient Follow-up:  1. Follow up with PCP in 1-2 weeks 2. Please obtain BMP/CBC in one week  Home Health: None Equipment/Devices: None  Discharge Condition: Stable CODE STATUS: Full Diet recommendation: Low-salt, low-fat diet  Brief/Interim Summary: Bianca Vester is a 54 y.o. male with a history of alcoholism with recent relapse, HTN, anxiety, and recent diagnosis of covid-19 (12/15 by home test) s/p bamlanivimab/etesevimab 12/20 who presented to the ED 12/22 when home pulse oximetry showed SpO2 down to 82%. He was confirmed to be mildly hypoxic, though CXR revealed no infiltrates, and CRP only modestly elevated. He was admitted to [facility] for acute hypoxic respiratory failure due to covid-19 infection and alcohol intoxication with concern for withdrawal, started on remdesivir and decadron as well as CIWA protocol.  Patient admitted as above with acute hypoxic respiratory failure in setting of COVID-19 pneumonia.  Patient had previously received monoclonal antibody treatment, improved drastically with steroids and Remdesivir in hospital.  Unfortunately due to hyperglycemia steroids were discontinued early but patient continued to improve drastically.  Patient now ambulating without requiring supplemental oxygen to maintain sats above 90% and otherwise feels quite stable and agreeable for discharge.  Patient initially admitted on CIWA protocol but given low score will not continue benzodiazepine taper at discharge.  Patient otherwise stable and agreeable for discharge home, recommend close follow-up with PCP in the next 1 to 2 weeks for further evaluation and treatment.  Patient previously educated on high risk for DVT in the setting of COVID-19 pneumonia, he did have a  previously provoked postoperative DVT in the past but D-dimer here remains within normal limits or minimally elevated.   Discharge Diagnoses:  Active Problems:   COVID-19 virus infection    Discharge Instructions  Discharge Instructions    Diet - low sodium heart healthy   Complete by: As directed    Increase activity slowly   Complete by: As directed      Allergies as of 08/16/2020      Reactions   Charentais Melon (french Melon) Anaphylaxis   Cucumber Extract Itching, Nausea And Vomiting   No extracts; just cucumber   Other Anaphylaxis   Peanut Butter Flavor Anaphylaxis, Swelling   Peanut Oil Swelling   Shellfish Allergy Itching, Swelling   Cantaloupe Extract Allergy Skin Test Rash   Lactose Other (See Comments)   Strawberry Extract Nausea And Vomiting, Swelling   Vancomycin Rash   Codeine    itching   Depakote Er [divalproex Sodium Er]    Tongue swelling      Medication List    TAKE these medications   aspirin 81 MG chewable tablet Chew 81 mg by mouth daily.   benzonatate 200 MG capsule Commonly known as: TESSALON Take 1 capsule (200 mg total) by mouth 3 (three) times daily as needed for cough.   busPIRone 10 MG tablet Commonly known as: BUSPAR Take 2 tablets (20 mg total) by mouth 3 (three) times daily.   Cholecalciferol 50 MCG (2000 UT) Caps Take 1 capsule by mouth daily.   diclofenac 75 MG EC tablet Commonly known as: VOLTAREN Take by mouth.   EPINEPHrine 0.3 mg/0.3 mL Soaj injection Commonly known as: EPI-PEN Inject 0.3 mg into the muscle as needed for anaphylaxis.   escitalopram 20 MG tablet Commonly known as: LEXAPRO Take 20 mg by  mouth daily.   folic acid 1 MG tablet Commonly known as: FOLVITE Take 1 tablet (1 mg total) by mouth daily.   lisinopril 10 MG tablet Commonly known as: ZESTRIL Take 1 tablet (10 mg total) by mouth daily.   Magnesium Oxide 400 (240 Mg) MG Tabs Take 240 mg by mouth.   mirtazapine 15 MG tablet Commonly  known as: REMERON Take 15 mg by mouth at bedtime.   pantoprazole 40 MG tablet Commonly known as: PROTONIX Take 40 mg by mouth daily.   saccharomyces boulardii 250 MG capsule Commonly known as: FLORASTOR Take 250 mg by mouth 2 (two) times daily.   thiamine 100 MG tablet Take 100 mg by mouth daily.   Your Life Multi Adult Gummies Chew Chew 1 tablet by mouth daily.       Follow-up Information    [name], DO. Schedule an appointment as soon as possible for a visit in 1 week.   Specialty: Family Medicine Why: To have potassium and magnesium levels rechecked Contact information: [address]  [address] [place] [address] [phone]              Allergies  Allergen Reactions  . Charentais Melon (French Melon) Anaphylaxis  . Cucumber Extract Itching and Nausea And Vomiting    No extracts; just cucumber   . Other Anaphylaxis  . Peanut Butter Flavor Anaphylaxis and Swelling  . Peanut Oil Swelling  . Shellfish Allergy Itching and Swelling  . Cantaloupe Extract Allergy Skin Test Rash  . Lactose Other (See Comments)  . Strawberry Extract Nausea And Vomiting and Swelling  . Vancomycin Rash  . Codeine     itching  . Depakote Er [Divalproex Sodium Er]     Tongue swelling    Consultations:  None   Procedures/Studies: DG Chest Portable 1 View  Result Date: 08/13/2020 CLINICAL DATA:  COVID-19 positive, shortness of breath EXAM: PORTABLE CHEST 1 VIEW COMPARISON:  08/08/2019 FINDINGS: No consolidation, features of edema, pneumothorax, or effusion. Pulmonary vascularity is normally distributed. The cardiomediastinal contours are unremarkable. No acute osseous or soft tissue abnormality. IMPRESSION: No acute cardiopulmonary abnormality. Electronically Signed   By: Lovena Le M.D.   On: 08/13/2020 02:14   US Abdomen Limited RUQ (LIVER/GB)  Result Date: 08/13/2020 CLINICAL DATA:  Elevated liver function test. History of positive COVID. EXAM:  ULTRASOUND ABDOMEN LIMITED RIGHT UPPER QUADRANT COMPARISON:  CT abdomen pelvis 06/23/2020. ultrasound abdomen 688 FINDINGS: Gallbladder: No gallstones or wall thickening visualized. No sonographic Murphy sign noted by sonographer. Common bile duct: Diameter: 4 mm Liver: No focal lesion identified. Increased parenchymal echogenicity. Portal vein is patent on color Doppler imaging with normal direction of blood flow towards the liver. Other: None. IMPRESSION: Hepatic steatosis. Please note limited evaluation for focal hepatic masses in a patient with hepatic steatosis due to decreased penetration of the acoustic ultrasound waves. Electronically Signed   By: Iven Finn M.D.   On: 08/13/2020 22:30     Subjective: No acute issues or events overnight tolerating p.o. well denies nausea, vomiting, diarrhea, constipation, headache, fevers, chills.   Discharge Exam: Vitals:   08/15/20 2041 08/16/20 0447  BP: (!) 113/58 116/81  Pulse: 67 63  Resp: 18 14  Temp: 98.1 F (36.7 C) 97.6 F (36.4 C)  SpO2: 97% 97%   Vitals:   08/15/20 0900 08/15/20 1322 08/15/20 2041 08/16/20 0447  BP:  96/67 (!) 113/58 116/81  Pulse:  75 67 63  Resp:  18 18 14  Temp:  98.7 F (37.1 C) 98.1 F (36.7 C) 97.6 F (36.4 C)  TempSrc:  Oral Oral Oral  SpO2: 98% 95% 97% 97%  Weight:      Height:        General: Pt is alert, awake, not in acute distress Cardiovascular: RRR, S1/S2 +, no rubs, no gallops Respiratory: CTA bilaterally, no wheezing, no rhonchi Abdominal: Soft, NT, ND, bowel sounds + Extremities: no edema, no cyanosis    The results of significant diagnostics from this hospitalization (including imaging, microbiology, ancillary and laboratory) are listed below for reference.     Microbiology: Recent Results (from the past 240 hour(s))  Resp Panel by RT-PCR (Flu A&B, Covid) Nasopharyngeal Swab     Status: Abnormal   Collection Time: 08/13/20 12:57 AM   Specimen: Nasopharyngeal Swab;  Nasopharyngeal(NP) swabs in vial transport medium  Result Value Ref Range Status   SARS Coronavirus 2 by RT PCR POSITIVE (A) NEGATIVE Final    Comment: RESULT CALLED TO, READ BACK BY AND VERIFIED WITH: NEAL,K AT 0140 ON [id number] BY CHERESNOWSKY,T (NOTE) SARS-CoV-2 target nucleic acids are DETECTED.  The SARS-CoV-2 RNA is generally detectable in upper respiratory specimens during the acute phase of infection. Positive results are indicative of the presence of the identified virus, but do not rule out bacterial infection or co-infection with other pathogens not detected by the test. Clinical correlation with patient history and other diagnostic information is necessary to determine patient infection status. The expected result is Negative.  Fact Sheet for Patients: EntrepreneurPulse.com.au  Fact Sheet for Healthcare Providers: IncredibleEmployment.be  This test is not yet approved or cleared by the [place] FDA and  has been authorized for detection and/or diagnosis of SARS-CoV-2 by FDA under an Emergency Use Authorization (EUA).  This EUA will remain in effect (meaning this te st can be used) for the duration of  the COVID-19 declaration under Section 564(b)(1) of the Act, 21 U.S.C. section 360bbb-3(b)(1), unless the authorization is terminated or revoked sooner.     Influenza A by PCR NEGATIVE NEGATIVE Final   Influenza B by PCR NEGATIVE NEGATIVE Final    Comment: (NOTE) The Xpert Xpress SARS-CoV-2/FLU/RSV plus assay is intended as an aid in the diagnosis of influenza from Nasopharyngeal swab specimens and should not be used as a sole basis for treatment. Nasal washings and aspirates are unacceptable for Xpert Xpress SARS-CoV-2/FLU/RSV testing.  Fact Sheet for Patients: EntrepreneurPulse.com.au  Fact Sheet for Healthcare Providers: IncredibleEmployment.be  This test is not yet approved or cleared by  the [place] FDA and has been authorized for detection and/or diagnosis of SARS-CoV-2 by FDA under an Emergency Use Authorization (EUA). This EUA will remain in effect (meaning this test can be used) for the duration of the COVID-19 declaration under Section 564(b)(1) of the Act, 21 U.S.C. section 360bbb-3(b)(1), unless the authorization is terminated or revoked.  Performed at [facility], [address] [place], [place] [address]   Blood Culture (routine x 2)     Status: None (Preliminary result)   Collection Time: 08/13/20  3:15 AM   Specimen: BLOOD  Result Value Ref Range Status   Specimen Description   Final    BLOOD LEFT ANTECUBITAL Performed at [facility], [address] [place], [place] [address]    Special Requests   Final    BOTTLES DRAWN AEROBIC AND ANAEROBIC Blood Culture adequate volume Performed at [facility], [address] [place], [place]  [address]    Culture   Final    NO GROWTH 2 DAYS Performed at [facility], [place]  [address] [place], [place] [address]    Report Status PENDING  Incomplete  Blood Culture (routine x 2)     Status: None (Preliminary result)   Collection Time: 08/13/20  3:35 AM   Specimen: BLOOD LEFT WRIST  Result Value Ref Range Status   Specimen Description   Final    BLOOD LEFT WRIST Performed at [facility], [address] [place], [place] [address]    Special Requests   Final    BOTTLES DRAWN AEROBIC AND ANAEROBIC Blood Culture adequate volume Performed at [facility], [address] [place], [place] [address]    Culture   Final    NO GROWTH 2 DAYS Performed at [facility], [place] [address], [place], [place] [address]    Report Status PENDING  Incomplete     Labs: BNP (last 3 results) No results for input(s): BNP in the last 8760 hours. Basic Metabolic Panel: Recent Labs  Lab 08/13/20 0125 08/13/20 0751 08/13/20 1152 08/14/20 0358  08/15/20 0356 08/16/20 0338  NA 137  --   --  138 141 142  K 2.9* 3.8  --  3.9 3.6 3.6  CL 99  --   --  104 105 104  CO2 21*  --   --  24 25 26   GLUCOSE 92  --   --  127* 117* 103*  BUN 10  --   --  12 14 16   CREATININE 0.74  --  0.68 0.75 0.83 0.85  CALCIUM 9.6  --   --  8.4* 8.0* 7.8*  MG 1.5* 1.6*  --  1.6* 2.0  --   PHOS  --   --   --  <1.0* 1.8* 2.8   Liver Function Tests: Recent Labs  Lab 08/13/20 0320 08/14/20 0358 08/15/20 0356 08/16/20 0338  AST 160* 100* 80* 68*  ALT 50* 39 34 33  ALKPHOS 99 87 82 79  BILITOT 1.1 1.2 1.1 1.1  PROT 6.9 6.8 6.6 6.2*  ALBUMIN 3.7 3.4* 3.2* 3.1*   No results for input(s): LIPASE, AMYLASE in the last 168 hours. No results for input(s): AMMONIA in the last 168 hours. CBC: Recent Labs  Lab 08/13/20 0155 08/13/20 1152 08/14/20 0358 08/15/20 0356  WBC 3.4* 2.0* 4.7 8.3  NEUTROABS 1.2*  --  3.0 6.0  HGB 13.8 13.8 13.4 13.3  HCT 37.5* 39.0 38.6* 38.0*  MCV 96.9 100.0 100.5* 101.1*  PLT 107* 96* 103* 108*   Cardiac Enzymes: No results for input(s): CKTOTAL, CKMB, CKMBINDEX, TROPONINI in the last 168 hours. BNP: Invalid input(s): POCBNP CBG: Recent Labs  Lab 08/15/20 0729 08/15/20 1122 08/15/20 1636 08/15/20 2044 08/16/20 0751  GLUCAP 118* 131* 143* 124* 90   D-Dimer Recent Labs    08/15/20 0356 08/16/20 0338  DDIMER 0.77* 0.48   Hgb A1c Recent Labs    08/14/20 0358  HGBA1C 4.8   Lipid Profile No results for input(s): CHOL, HDL, LDLCALC, TRIG, CHOLHDL, LDLDIRECT in the last 72 hours. Thyroid function studies No results for input(s): TSH, T4TOTAL, T3FREE, THYROIDAB in the last 72 hours.  Invalid input(s): FREET3 Anemia work up No results for input(s): VITAMINB12, FOLATE, FERRITIN, TIBC, IRON, RETICCTPCT in the last 72 hours. Urinalysis No results found for: COLORURINE, APPEARANCEUR, [place], [place], [place], [place], [place], [place], [place], UROBILINOGEN, NITRITE, LEUKOCYTESUR Sepsis Labs Invalid  input(s): PROCALCITONIN,  WBC,  LACTICIDVEN Microbiology Recent Results (from the past 240 hour(s))  Resp Panel by RT-PCR (Flu A&B, Covid) Nasopharyngeal Swab     Status: Abnormal   Collection Time: 08/13/20 12:57 AM   Specimen: Nasopharyngeal Swab; Nasopharyngeal(NP) swabs in vial transport medium  Result Value Ref Range Status   SARS Coronavirus 2 by RT PCR POSITIVE (A) NEGATIVE Final    Comment: RESULT CALLED TO, READ BACK BY AND VERIFIED WITH: NEAL,K AT 0140 ON [id number] BY CHERESNOWSKY,T (NOTE) SARS-CoV-2 target nucleic acids are DETECTED.  The SARS-CoV-2 RNA is generally detectable in upper respiratory specimens during the acute phase of infection. Positive results are indicative of the presence of the identified virus, but do not rule out bacterial infection or co-infection with other pathogens not detected by the test. Clinical correlation with patient history and other diagnostic information is necessary to determine patient infection status. The expected result is Negative.  Fact Sheet for Patients: EntrepreneurPulse.com.au  Fact Sheet for Healthcare Providers: IncredibleEmployment.be  This test is not yet approved or cleared by the [place] FDA and  has been authorized for detection and/or diagnosis of SARS-CoV-2 by FDA under an Emergency Use Authorization (EUA).  This EUA will remain in effect (meaning this te st can be used) for the duration of  the COVID-19 declaration under Section 564(b)(1) of the Act, 21 U.S.C. section 360bbb-3(b)(1), unless the authorization is terminated or revoked sooner.     Influenza A by PCR NEGATIVE NEGATIVE Final   Influenza B by PCR NEGATIVE NEGATIVE Final    Comment: (NOTE) The Xpert Xpress SARS-CoV-2/FLU/RSV plus assay is intended as an aid in the diagnosis of influenza from Nasopharyngeal swab specimens and should not be used as a sole basis for treatment. Nasal washings and aspirates are  unacceptable for Xpert Xpress SARS-CoV-2/FLU/RSV testing.  Fact Sheet for Patients: EntrepreneurPulse.com.au  Fact Sheet for Healthcare Providers: IncredibleEmployment.be  This test is not yet approved or cleared by the [place] FDA and has been authorized for detection and/or diagnosis of SARS-CoV-2 by FDA under an Emergency Use Authorization (EUA). This EUA will remain in effect (meaning this test can be used) for the duration of the COVID-19 declaration under Section 564(b)(1) of the Act, 21 U.S.C. section 360bbb-3(b)(1), unless the authorization is terminated or revoked.  Performed at [facility], [address] [place], [place] [address]   Blood Culture (routine x 2)     Status: None (Preliminary result)   Collection Time: 08/13/20  3:15 AM   Specimen: BLOOD  Result Value Ref Range Status   Specimen Description   Final    BLOOD LEFT ANTECUBITAL Performed at [facility], [address] [place], [place] [address]    Special Requests   Final    BOTTLES DRAWN AEROBIC AND ANAEROBIC Blood Culture adequate volume Performed at [facility], [address] [place], [place] [address]    Culture   Final    NO GROWTH 2 DAYS Performed at [facility], [place] [address], [place], [place] [address]    Report Status PENDING  Incomplete  Blood Culture (routine x 2)     Status: None (Preliminary result)   Collection Time: 08/13/20  3:35 AM   Specimen: BLOOD LEFT WRIST  Result Value Ref Range Status   Specimen Description   Final    BLOOD LEFT WRIST Performed at [facility], [address] [place], [place] [address]  Special Requests   Final    BOTTLES DRAWN AEROBIC AND ANAEROBIC Blood Culture adequate volume Performed at [facility], [address] [place], [place] [address]    Culture   Final    NO GROWTH 2 DAYS Performed at [facility], [place] [address],  [place], [place] [address]    Report Status PENDING  Incomplete     Time coordinating discharge: Over 30 minutes  SIGNED:   Little Ishikawa, DO [facility] 08/16/2020, 11:17 AM Pager   If 7PM-7AM, please contact night-coverage [email or web address]

## 2020-08-18 ENCOUNTER — Other Ambulatory Visit: Payer: Self-pay | Admitting: *Deleted

## 2020-08-18 LAB — CULTURE, BLOOD (ROUTINE X 2)
Culture: NO GROWTH
Culture: NO GROWTH
Special Requests: ADEQUATE
Special Requests: ADEQUATE

## 2020-08-18 NOTE — Patient Outreach (Addendum)
[facility]) Care Management  08/18/2020  Juan [name] 06-Aug-1966 [id number] Transition of care call/case closure   Referral received:08/14/20 Initial outreach:08/18/20 Insurance: [place] [employer]    Subjective: Initial successful telephone call to patient's preferred number in order to complete transition of care assessment; 2 HIPAA identifiers verified. Explained purpose of call and completed transition of care assessment patient request that I speak with his wife Juan [name].  Per wife patient reports doing good but better than prior to admission. She reports patient denies shortness of breath, has cough that has improved, no fever, oxygen saturation in the mid 90's. Patient continues to use flutter valve. Reinforced rest and staying hydrated patient reports appetite improved, and taste returning.   Spouse  are assisting with his  recovery.   Reviewed accessing the following [place] Benefits : He denies need for  a referral to one of the [place] chronic disease management programs.  Wife states she is unsure if she does  have the hospital indemnity plan  provided contact number.  He uses a [facility]   He denies additional  educational needs related to staying safe during the COVID 19 pandemic,reinforced socially distancing, wearing mask  Handwashing.     Objective:  Juan [name]  was hospitalized at [facility] for Covid 19 Pneumonia . Comorbidities include: Hypertension, ETOH abuse.  He  was discharged to home on 08/16/20  without the need for home health services or durable medical equipment per the discharge summary.     Assessment:  Patient voices good understanding of all discharge instructions.  See transition of care flowsheet for assessment details.   Plan:  Reviewed hospital discharge diagnosis of Covid 19 Pneumonia   and discharge treatment plan using hospital discharge instructions, assessing  medication adherence, reviewing problems requiring provider notification, and discussing the importance of follow up with surgeon, primary care provider and/or specialists as directed.  Reviewed [place] healthy lifestyle program information to receive discounted premium for  2023   Step 1: Get  your annual physical  Step 2: Complete your health assessment  Step 3:Identify your current health status and complete the corresponding action step between January 1, and April 23, 2022.        No ongoing care management needs identified so will close case to [facility] Care Management services and route successful outreach letter with [facility] Care Management pamphlet and 24 Hour Nurse Line Magnet to [employer] Care Management clinical pool to be mailed to patient's home address.  Thanked patient for their services to Juan [name].  Egbert Garibaldi, RN, BSN  [name],Care Management Coordinator  [phone]- Mobile [phone]- Toll Free Main Office

## 2020-08-20 ENCOUNTER — Encounter: Payer: 59 | Admitting: Physical Therapy

## 2020-08-21 ENCOUNTER — Telehealth (INDEPENDENT_AMBULATORY_CARE_PROVIDER_SITE_OTHER): Payer: 59 | Admitting: Sports Medicine

## 2020-08-21 ENCOUNTER — Ambulatory Visit: Payer: 59 | Admitting: Sports Medicine

## 2020-08-21 DIAGNOSIS — M7541 Impingement syndrome of right shoulder: Secondary | ICD-10-CM | POA: Diagnosis not present

## 2020-08-21 DIAGNOSIS — M1812 Unilateral primary osteoarthritis of first carpometacarpal joint, left hand: Secondary | ICD-10-CM

## 2020-08-21 NOTE — Assessment & Plan Note (Signed)
Juan [name] is a pleasant 54 year old male, he had right shoulder pain, we injected his subacromial bursa at the last visit, he did have an MRI done as well at the [place], we will take a look at this at some point in the future. He reports that shoulder is doing a good amount better, has not yet had a chance to do PT due to Covid. Like to see him back after about 4 weeks of physical therapy.

## 2020-08-21 NOTE — Assessment & Plan Note (Signed)
We injected his first [place] at the last visit, only minimal improvement, as above we will start formal PT and do 4 weeks before considering additional intervention such as a thumb suspension. He will also get a thumb loop from [employer].

## 2020-08-21 NOTE — Progress Notes (Signed)
   Virtual Visit via Telephone   I connected with  [name]  on 08/21/20 by telephone/telehealth and verified that I am speaking with the correct person using two identifiers.   I discussed the limitations, risks, security and privacy concerns of performing an evaluation and management service by telephone, including the higher likelihood of inaccurate diagnosis and treatment, and the availability of in person appointments.  We also discussed the likely need of an additional face to face encounter for complete and high quality delivery of care.  I also discussed with the patient that there may be a patient responsible charge related to this service. The patient expressed understanding and wishes to proceed.  Provider location is in medical facility. Patient location is at their home, different from provider location. People involved in care of the patient during this telehealth encounter were myself, my nurse/medical assistant, and my front office/scheduling team member.  Review of Systems: No fevers, chills, night sweats, weight loss, chest pain, or shortness of breath.   Objective Findings:    General: Speaking full sentences, no audible heavy breathing.  Sounds alert and appropriately interactive.    Independent interpretation of tests performed by another provider:   None.  Brief History, Exam, Impression, and Recommendations:    Impingement syndrome, shoulder, right Jojuan is a pleasant 54 year old male, he had right shoulder pain, we injected his subacromial bursa at the last visit, he did have an MRI done as well at the [place], we will take a look at this at some point in the future. He reports that shoulder is doing a good amount better, has not yet had a chance to do PT due to Covid. Like to see him back after about 4 weeks of physical therapy.  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 He will also get a thumb loop from [employer].   I discussed the above assessment and treatment plan with the patient. The patient was provided an opportunity to ask questions and all were answered. The patient agreed with the plan and demonstrated an understanding of the instructions.   The patient was advised to call back or seek an in-person evaluation if the symptoms worsen or if the condition fails to improve as anticipated.   I provided 30 minutes of face to face and non-face-to-face time during this encounter date, time was needed to gather information, review chart, records, communicate/coordinate with staff remotely, as well as complete documentation.   ___________________________________________ Gwen Her. Dianah Field, M.D., ABFM., CAQSM. Primary Care and Sports Medicine [facility]  Adjunct Professor of [place] [employer]

## 2020-08-26 ENCOUNTER — Ambulatory Visit: Payer: Self-pay | Admitting: Neurology

## 2020-08-27 ENCOUNTER — Encounter: Payer: Self-pay | Admitting: Family Medicine

## 2020-08-27 ENCOUNTER — Telehealth (INDEPENDENT_AMBULATORY_CARE_PROVIDER_SITE_OTHER): Payer: 59 | Admitting: Family Medicine

## 2020-08-27 VITALS — Ht 65.0 in

## 2020-08-27 DIAGNOSIS — U071 COVID-19: Secondary | ICD-10-CM

## 2020-08-27 NOTE — Assessment & Plan Note (Signed)
He is recovering well from recent COVID-19 infection.  Encouraged to continue use of flutter valve.  He may add Mucinex as well as increased fluids.  We will recheck CMP and CBC.  He will let me know if he is having any new or worsening symptoms

## 2020-08-27 NOTE — Progress Notes (Signed)
Juan [name] - 55 y.o. male MRN [id number]  Date of birth: 1966/05/04   This visit type was conducted due to national recommendations for restrictions regarding the COVID-19 Pandemic (e.g. social distancing).  This format is felt to be most appropriate for this patient at this time.  All issues noted in this document were discussed and addressed.  No physical exam was performed (except for noted visual exam findings with Video Visits).  I discussed the limitations of evaluation and management by telemedicine and the availability of in person appointments. The patient expressed understanding and agreed to proceed.  I connected with@ on 08/27/20 at  3:40 PM EST by a video enabled telemedicine application and verified that I am speaking with the correct person using two identifiers.  Present at visit: [name], DO [name]   Patient Location: [place] [address]   Provider location:   [facility]  Chief Complaint  Patient presents with  . Follow-up    HPI  Juan [name] is a 55 y.o. male who presents via audio/video conferencing for a telehealth visit today.  He is following up today for recent hospitalization due to Covid.  He was diagnosed with Covid on 08/06/2020.  Received monoclonal antibody infusion with Bamlanivimab and Etesevimab on 08/11/2020.  Unfortunately he developed hypoxia and was admitted to the hospital on 08/13/2020.  O2 sats at time of admission were 82% on room air.  Chest x-ray did not reveal any infiltrates.  He was provided with supplemental oxygen as well as remdesivir and steroids.  He did have significant improvement with steroids however these were discontinued due to hyperglycemia.  Within a couple of days he was able to maintain his O2 saturations without supplemental oxygen.  He was monitored for alcohol withdrawal during his hospitalization.  Today reports he is feeling much better.  He still has lingering cough but denies  significant dyspnea.  He is having some improvement with use of flutter valve.  He is drinking plenty of fluids.  He denies fever or chest pain.  ROS:  A comprehensive ROS was completed and negative except as noted per HPI     Past Medical History:  Diagnosis Date  . Alcohol addiction ([place])   . Anxiety   . Colon polyps   . Depression   . Hemochromatosis   . Hx of blood clots    Leg  . Hypertension     Past Surgical History:  Procedure Laterality Date  . FRACTURE SURGERY    . HERNIA REPAIR    . VASECTOMY      History reviewed. No pertinent family history.  Social History   Socioeconomic History  . Marital status: Married    Spouse name: Not on file  . Number of children: Not on file  . Years of education: Not on file  . Highest education level: Not on file  Occupational History  . Not on file  Tobacco Use  . Smoking status: Never Smoker  . Smokeless tobacco: Current User    Types: Snuff  Vaping Use  . Vaping Use: Never used  Substance and Sexual Activity  . Alcohol use: Not Currently  . Drug use: Never  . Sexual activity: Not on file  Other Topics Concern  . Not on file  Social History Narrative  . Not on file   Social Determinants of Health   Financial Resource Strain: Not on file  Food Insecurity: Not on file  Transportation Needs: Not on file  Physical  Activity: Not on file  Stress: Not on file  Social Connections: Not on file  Intimate Partner Violence: Not on file     Current Outpatient Medications:  .  aspirin 81 MG chewable tablet, Chew 81 mg by mouth daily., Disp: , Rfl:  .  benzonatate (TESSALON) 200 MG capsule, Take 1 capsule (200 mg total) by mouth 3 (three) times daily as needed for cough., Disp: 30 capsule, Rfl: 0 .  busPIRone (BUSPAR) 10 MG tablet, Take 2 tablets (20 mg total) by mouth 3 (three) times daily., Disp: 540 tablet, Rfl: 1 .  Cholecalciferol 50 MCG (2000 UT) CAPS, Take 1 capsule by mouth daily., Disp: , Rfl:  .  diclofenac  (VOLTAREN) 75 MG EC tablet, Take by mouth. (Patient not taking: No sig reported), Disp: , Rfl:  .  EPINEPHrine 0.3 mg/0.3 mL IJ SOAJ injection, Inject 0.3 mg into the muscle as needed for anaphylaxis., Disp: 1 each, Rfl: 0 .  escitalopram (LEXAPRO) 20 MG tablet, Take 20 mg by mouth daily., Disp: , Rfl:  .  folic acid (FOLVITE) 1 MG tablet, Take 1 tablet (1 mg total) by mouth daily., Disp: 90 tablet, Rfl: 1 .  lisinopril (ZESTRIL) 10 MG tablet, Take 1 tablet (10 mg total) by mouth daily., Disp: 90 tablet, Rfl: 3 .  Magnesium Oxide 400 (240 Mg) MG TABS, Take 240 mg by mouth., Disp: , Rfl:  .  mirtazapine (REMERON) 15 MG tablet, Take 15 mg by mouth at bedtime., Disp: , Rfl:  .  Multiple Vitamins-Minerals (YOUR LIFE MULTI ADULT GUMMIES) CHEW, Chew 1 tablet by mouth daily., Disp: , Rfl:  .  pantoprazole (PROTONIX) 40 MG tablet, Take 40 mg by mouth daily., Disp: , Rfl:  .  saccharomyces boulardii (FLORASTOR) 250 MG capsule, Take 250 mg by mouth 2 (two) times daily. , Disp: , Rfl:  .  thiamine 100 MG tablet, Take 100 mg by mouth daily., Disp: , Rfl:   EXAM:  VITALS per patient if applicable: Ht 5' 5"  (1.651 m)   BMI 28.79 kg/m   GENERAL: alert, oriented, appears well and in no acute distress  HEENT: atraumatic, conjunttiva clear, no obvious abnormalities on inspection of external nose and ears  NECK: normal movements of the head and neck  LUNGS: on inspection no signs of respiratory distress, breathing rate appears normal, no obvious gross SOB, gasping or wheezing  CV: no obvious cyanosis  MS: moves all visible extremities without noticeable abnormality  PSYCH/NEURO: pleasant and cooperative, no obvious depression or anxiety, speech and thought processing grossly intact  ASSESSMENT AND PLAN:  Discussed the following assessment and plan:  COVID-19 He is recovering well from recent COVID-19 infection.  Encouraged to continue use of flutter valve.  He may add Mucinex as well as increased  fluids.  We will recheck CMP and CBC.  He will let me know if he is having any new or worsening symptoms     I discussed the assessment and treatment plan with the patient. The patient was provided an opportunity to ask questions and all were answered. The patient agreed with the plan and demonstrated an understanding of the instructions.   The patient was advised to call back or seek an in-person evaluation if the symptoms worsen or if the condition fails to improve as anticipated.    [name], DO

## 2020-08-27 NOTE — Progress Notes (Signed)
Attempted to contact patient via phone for pre-visit. No answer.

## 2020-09-08 ENCOUNTER — Encounter: Payer: 59 | Admitting: Physical Therapy

## 2020-09-12 ENCOUNTER — Encounter: Payer: Self-pay | Admitting: Medical-Surgical

## 2020-09-12 ENCOUNTER — Ambulatory Visit (INDEPENDENT_AMBULATORY_CARE_PROVIDER_SITE_OTHER): Payer: 59 | Admitting: Medical-Surgical

## 2020-09-12 ENCOUNTER — Other Ambulatory Visit: Payer: Self-pay

## 2020-09-12 VITALS — BP 124/80 | HR 93 | Temp 98.5°F | Ht 65.0 in | Wt 175.5 lb

## 2020-09-12 DIAGNOSIS — R399 Unspecified symptoms and signs involving the genitourinary system: Secondary | ICD-10-CM | POA: Diagnosis not present

## 2020-09-12 DIAGNOSIS — U071 COVID-19: Secondary | ICD-10-CM | POA: Diagnosis not present

## 2020-09-12 DIAGNOSIS — R350 Frequency of micturition: Secondary | ICD-10-CM

## 2020-09-12 LAB — POCT URINALYSIS DIP (CLINITEK)
Bilirubin, UA: NEGATIVE
Blood, UA: NEGATIVE
Glucose, UA: NEGATIVE mg/dL
Ketones, POC UA: NEGATIVE mg/dL
Leukocytes, UA: NEGATIVE
Nitrite, UA: NEGATIVE
POC PROTEIN,UA: NEGATIVE
Spec Grav, UA: 1.02 (ref 1.010–1.025)
Urobilinogen, UA: 1 E.U./dL
pH, UA: 7.5 (ref 5.0–8.0)

## 2020-09-12 NOTE — Progress Notes (Signed)
Subjective:    CC: urinary symptoms   HPI: Pleasant 55 year old male presenting today to discuss 1-2 months of urinary symptoms including frequency, nocturia, urgency, urge incontinence, dribbling, incomplete emptying. He is getting up approximately 3 times nightly to void. Denies burning, abnormal color, foul odor, fever, chills, nausea, constipation/diarrhea, pain with erection/intercourse/BMs, perineal/testicular pain, hesitancy, weak urinary stream, penile discharge, and concern for STIs. Drinks liquids up until bedtime and when he wakes in the middle of the night.   I reviewed the past medical history, family history, social history, surgical history, and allergies today and no changes were needed.  Please see the problem list section below in epic for further details.  Past Medical History: Past Medical History:  Diagnosis Date  . Alcohol addiction ([place])   . Anxiety   . Colon polyps   . Depression   . Hemochromatosis   . Hx of blood clots    Leg  . Hypertension    Past Surgical History: Past Surgical History:  Procedure Laterality Date  . FRACTURE SURGERY    . HERNIA REPAIR    . VASECTOMY     Social History: Social History   Socioeconomic History  . Marital status: Married    Spouse name: Not on file  . Number of children: Not on file  . Years of education: Not on file  . Highest education level: Not on file  Occupational History  . Not on file  Tobacco Use  . Smoking status: Never Smoker  . Smokeless tobacco: Current User    Types: Snuff  Vaping Use  . Vaping Use: Never used  Substance and Sexual Activity  . Alcohol use: Not Currently  . Drug use: Never  . Sexual activity: Not on file  Other Topics Concern  . Not on file  Social History Narrative  . Not on file   Social Determinants of Health   Financial Resource Strain: Not on file  Food Insecurity: Not on file  Transportation Needs: Not on file  Physical Activity: Not on file  Stress: Not on file   Social Connections: Not on file   Family History: History reviewed. No pertinent family history. Allergies: Allergies  Allergen Reactions  . Charentais Melon (French Melon) Anaphylaxis  . Cucumber Extract Itching and Nausea And Vomiting    No extracts; just cucumber   . Other Anaphylaxis  . Peanut Butter Flavor Anaphylaxis and Swelling  . Peanut Oil Swelling  . Shellfish Allergy Itching and Swelling  . Cantaloupe Extract Allergy Skin Test Rash  . Lactose Other (See Comments)  . Strawberry Extract Nausea And Vomiting and Swelling  . Vancomycin Rash  . Codeine     itching  . Depakote Er [Divalproex Sodium Er]     Tongue swelling   Medications: See med rec.  Review of Systems: See HPI for pertinent positives and negatives.   Objective:    General: Well Developed, well nourished, and in no acute distress.  Neuro: Alert and oriented x3. HEENT: Normocephalic, atraumatic.  Skin: Warm and dry. Cardiac: Regular rate and rhythm, no murmurs rubs or gallops, no lower extremity edema.  Respiratory: Clear to auscultation bilaterally. Not using accessory muscles, speaking in full sentences. Abdomen: Soft, nondistended. Tender to RUQ/LUQ/LLQ, nontender RLQ. Bowel sounds + x 4 quadrants. No HSM appreciated.  Impression and Recommendations:    1. Urinary frequency POCT UA normal. - POCT URINALYSIS DIP (CLINITEK)  2. Lower urinary tract symptoms (LUTS) Checking CBC, CMP, and PSA today. Recommend reducing fluid intake in  the hours before bed and during waking episodes at night as this can worsen nocturia.  - PSA  Return if symptoms worsen or fail to improve. ___________________________________________ Clearnce Sorrel, DNP, APRN, FNP-BC Primary Care and [place]

## 2020-09-13 LAB — COMPLETE METABOLIC PANEL WITH GFR
AG Ratio: 1.2 (calc) (ref 1.0–2.5)
ALT: 21 U/L (ref 9–46)
AST: 92 U/L — ABNORMAL HIGH (ref 10–35)
Albumin: 4 g/dL (ref 3.6–5.1)
Alkaline phosphatase (APISO): 95 U/L (ref 35–144)
BUN/Creatinine Ratio: 7 (calc) (ref 6–22)
BUN: 5 mg/dL — ABNORMAL LOW (ref 7–25)
CO2: 25 mmol/L (ref 20–32)
Calcium: 8.1 mg/dL — ABNORMAL LOW (ref 8.6–10.3)
Chloride: 99 mmol/L (ref 98–110)
Creat: 0.71 mg/dL (ref 0.70–1.33)
GFR, Est African American: 125 mL/min/{1.73_m2} (ref >=60)
GFR, Est Non African American: 108 mL/min/{1.73_m2} (ref >=60)
Globulin: 3.4 g/dL (calc) (ref 1.9–3.7)
Glucose, Bld: 95 mg/dL (ref 65–99)
Potassium: 3.6 mmol/L (ref 3.5–5.3)
Sodium: 138 mmol/L (ref 135–146)
Total Bilirubin: 1.3 mg/dL — ABNORMAL HIGH (ref 0.2–1.2)
Total Protein: 7.4 g/dL (ref 6.1–8.1)

## 2020-09-13 LAB — CBC
HCT: 36.2 % — ABNORMAL LOW (ref 38.5–50.0)
Hemoglobin: 12.8 g/dL — ABNORMAL LOW (ref 13.2–17.1)
MCH: 34.9 pg — ABNORMAL HIGH (ref 27.0–33.0)
MCHC: 35.4 g/dL (ref 32.0–36.0)
MCV: 98.6 fL (ref 80.0–100.0)
MPV: 10.8 fL (ref 7.5–12.5)
Platelets: 43 10*3/uL — ABNORMAL LOW (ref 140–400)
RBC: 3.67 10*6/uL — ABNORMAL LOW (ref 4.20–5.80)
RDW: 13.2 % (ref 11.0–15.0)
WBC: 3.7 10*3/uL — ABNORMAL LOW (ref 3.8–10.8)

## 2020-09-13 LAB — PSA: PSA: 0.88 ng/mL (ref <=4.0)

## 2020-09-15 ENCOUNTER — Encounter: Payer: Self-pay | Admitting: Physical Therapy

## 2020-09-15 ENCOUNTER — Other Ambulatory Visit: Payer: Self-pay

## 2020-09-15 ENCOUNTER — Ambulatory Visit (INDEPENDENT_AMBULATORY_CARE_PROVIDER_SITE_OTHER): Payer: 59 | Admitting: Physical Therapy

## 2020-09-15 DIAGNOSIS — M6281 Muscle weakness (generalized): Secondary | ICD-10-CM

## 2020-09-15 DIAGNOSIS — M25511 Pain in right shoulder: Secondary | ICD-10-CM

## 2020-09-15 NOTE — Patient Instructions (Signed)
Access Code: [email or web address] URL: [email or web address] Date: 09/15/2020 Prepared by: Almyra Free  Exercises Seated Scapular Retraction - 1 x daily - 7 x weekly - 1 sets - 10 reps - 5 seconds hold Standing Shoulder Internal Rotation Stretch with Towel - 1 x daily - 7 x weekly - 1-3 sets - 10 reps Supine Shoulder Flexion with [place] - 1 x daily - 7 x weekly - 3 sets - 10 reps Supine Bilateral Shoulder External Rotation with Resistance around Wrists - 1 x daily - 7 x weekly - 1 sets - 10 reps - 5-10 sec hold Supine Shoulder Horizontal Abduction with Resistance - 1 x daily - 7 x weekly - 3 sets - 10 reps Isometric Shoulder Abduction at Wall - 1 x daily - 7 x weekly - 3 sets - 10 reps

## 2020-09-15 NOTE — Therapy (Addendum)
[facility] [address] [place], [place], [address] Phone: [phone]   Fax:  [phone]  Physical Therapy Treatment and Discharge Summary  Patient Details  Name: Juan [name] MRN: [id number] Date of Birth: Jun 17, 1966 Referring Provider (PT): [email or web address]   Encounter Date: 09/15/2020   PT End of Session - 09/15/20 1021    Visit Number 2    Number of Visits 14    Date for PT Re-Evaluation 10/27/20    PT Start Time 1022    PT Stop Time 1102    PT Time Calculation (min) 40 min    Activity Tolerance Patient tolerated treatment well    Behavior During Therapy [facility] for tasks assessed/performed           Past Medical History:  Diagnosis Date  . Alcohol addiction ([place])   . Anxiety   . Colon polyps   . Depression   . Hemochromatosis   . Hx of blood clots    Leg  . Hypertension     Past Surgical History:  Procedure Laterality Date  . FRACTURE SURGERY    . HERNIA REPAIR    . VASECTOMY      There were no vitals filed for this visit.   Subjective Assessment - 09/15/20 1023    Subjective Patient reporting 4/10 pain in right shoulder and he's still limted with ROM    Pertinent History SLAP tear Rt shoulder 2018, SLAP Lt shoulder 2017, cervial herniated discs C3-C5    Patient Stated Goals move my arm without pain    Currently in Pain? Yes    Pain Score 4     Pain Location Shoulder    Pain Orientation Right    Pain Descriptors / Indicators Aching    Pain Onset More than a month ago    Pain Frequency Intermittent    Aggravating Factors  reaching behind him and overhead              [facility] PT Assessment - 09/15/20 0001      AROM   Right Shoulder Flexion 138 Degrees   160 passive   Right Shoulder ABduction 71 Degrees   72 limited by pain passively   Right Shoulder Internal Rotation 68 Degrees    Right Shoulder External Rotation 75 Degrees      Strength   Strength Assessment Site Shoulder    Right  Shoulder Flexion 4+/5    Right Shoulder Extension 5/5    Right Shoulder ABduction 4+/5    Right Shoulder Internal Rotation 4+/5   with pain   Right Shoulder External Rotation 4+/5    Left Shoulder Flexion --   5-/5   Left Shoulder Extension 5/5    Left Shoulder ABduction --   5-/5   Left Shoulder Internal Rotation 5/5    Left Shoulder External Rotation --   5-/5     Palpation   Palpation comment subscapularis, infraspinatus tender; infraspinatus insertion      Special Tests    Special Tests Rotator Cuff Impingement    Rotator Cuff Impingment tests Neer impingement test;Hawkins- Kennedy test      Neer Impingement test    Findings Positive    Side Right      Hawkins-Kennedy test   Findings Positive    Side Right                         [facility] Adult PT Treatment/Exercise - 09/15/20 0001  Shoulder Exercises: Supine   Horizontal ABduction Both;10 reps    Theraband Level (Shoulder Horizontal ABduction) Level 2 (Red)    Flexion Both;10 reps    Theraband Level (Shoulder Flexion) Level 2 (Red)    Flexion Limitations band around wrists with tension; also A/AROM with noodle x 10      Shoulder Exercises: Standing   External Rotation AROM;10 reps;Strengthening    Theraband Level (Shoulder External Rotation) Level 2 (Red)    External Rotation Limitations on noodle standing some pain; then with band 5 sec hold x 10    Internal Rotation AAROM;Right;10 reps    Internal Rotation Limitations with strap behind back    Extension Both;10 reps    Theraband Level (Shoulder Extension) Level 2 (Red)    Row Both;10 reps    Theraband Level (Shoulder Row) Level 2 (Red)    Retraction 10 reps      Shoulder Exercises: ROM/Strengthening   UBE (Upper Arm Bike) L3 x 1 min fwd/1 min bwd; some      Shoulder Exercises: Isometric Strengthening   ABduction 5X10"                  PT Education - 09/15/20 1330    Education Details HEP modified and progressed    Person(s)  Educated Patient    Methods Explanation;Demonstration;Handout    Comprehension Verbalized understanding;Returned demonstration               PT Long Term Goals - 09/15/20 1336      PT LONG TERM GOAL #1   Title Pt will be independent with HEP to improve posture and strength    Time 6    Period Weeks    Status On-going    Target Date 10/27/20      PT LONG TERM GOAL #2   Title Pt will increase Rt shoulder strength to 5/5 to improve ability to perform IADLs without pain    Time 6    Period Weeks    Status Revised    Target Date 10/27/20      PT LONG TERM GOAL #3   Title Pt will improve Rt shoulder horizontal AROM to [facility] to assist with IADLs    Time 6    Period Weeks    Status On-going    Target Date 10/27/20      PT LONG TERM GOAL #4   Title Patient to report decreased pain by >=50% with OH ADLS and reaching behind back with his right arm.    Time 6    Period Weeks    Status New    Target Date 11/03/20                 Plan - 09/15/20 1330    Clinical Impression Statement Patient presents with continued c/o of right shoulder pain with reaching overhead and behind his back. He has not been to PT for one month but reports compliance with HEP. He has significant improvement in bil shoulder strength from initial eval, but still has pain with flexion, ABDuction and internal rotation and ROM is limited in these planes as well. He tolerated exercises well today and HEP was progressed. He will benefit from continued PT to achieve LTGs set at intial evaluation.    Personal Factors and Comorbidities Comorbidity 3+    Comorbidities balance deficits, cervical herniated discs, alcoholism    Examination-Activity Limitations Carry;Reach Overhead    PT Frequency 2x / week    PT Duration 6 weeks  PT Treatment/Interventions ADLs/Self Care Home Management;Cryotherapy;Electrical Stimulation;Iontophoresis 49m/ml Dexamethasone;Moist Heat;Therapeutic exercise;Therapeutic  activities;Patient/family education;Manual techniques;Vasopneumatic Device;Taping;Dry needling    PT Next Visit Plan Continue ROM, strengthening, assess need for ionto    PT Home Exercise Plan [email or web address]    Consulted and Agree with Plan of Care Patient           Patient will benefit from skilled therapeutic intervention in order to improve the following deficits and impairments:  Decreased activity tolerance,Decreased strength,Pain,Impaired UE functional use,Postural dysfunction  Visit Diagnosis: Acute pain of right shoulder  Muscle weakness (generalized)     Problem List Patient Active Problem List   Diagnosis Date Noted  . COVID-19 virus infection 08/13/2020  . COVID-19 08/07/2020  . Primary osteoarthritis of first carpometacarpal joint of one hand, left 07/11/2020  . Allergic reaction 07/08/2020  . History of alcohol abuse 05/20/2020  . Impingement syndrome, shoulder, right 05/20/2020  . Fracture of laryngeal cartilage ([place] 01/17/2020  . Esophagitis, [place] grade D 12/05/2019  . Chronic alcoholic liver disease ([place] 09/30/2019  . GAD (generalized anxiety disorder) 04/16/2019  . Chronic post-traumatic stress disorder (PTSD) 04/16/2019  . MDD (major depressive disorder), recurrent severe, without psychosis ([place] 04/15/2019  . History of bilateral inguinal hernia repair 01/19/2019  . 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 Laceration of extensor hallucis longus tendon, left, initial encounter 02/06/2017  . Elevated liver enzymes 01/07/2017  . Chronic fatigue 12/21/2016  . Benign essential hypertension 12/21/2016    JMadelyn FlavorsPT 09/15/2020, 1:45 PM  [facility] [place] [address]Phone: [phone]  Fax:  [phone] Name: SAziah BrostromMRN: [id number] of Birth: 2Jan 11, 1967 PHYSICAL THERAPY DISCHARGE SUMMARY  Visits from Start of Care: 2  Current functional level related to goals /  functional outcomes: unknown   Remaining deficits: unknown   Education / Equipment: HEP Plan: Patient agrees to discharge.  Patient goals were not met. Patient is being discharged due to not returning since the last visit.  ?????     JMadelyn Flavors PT 10/22/20 6:13 PM  [facility] [place] [address] [phone](office) [phone](fax)

## 2020-09-18 ENCOUNTER — Other Ambulatory Visit: Payer: Self-pay | Admitting: Family Medicine

## 2020-09-18 ENCOUNTER — Encounter: Payer: 59 | Admitting: Physical Therapy

## 2020-09-18 ENCOUNTER — Other Ambulatory Visit: Payer: Self-pay

## 2020-09-18 DIAGNOSIS — R7989 Other specified abnormal findings of blood chemistry: Secondary | ICD-10-CM

## 2020-09-18 DIAGNOSIS — D61818 Other pancytopenia: Secondary | ICD-10-CM

## 2020-09-22 ENCOUNTER — Encounter: Payer: 59 | Admitting: Physical Therapy

## 2020-09-25 ENCOUNTER — Encounter: Payer: 59 | Admitting: Physical Therapy

## 2020-09-29 ENCOUNTER — Encounter: Payer: 59 | Admitting: Physical Therapy

## 2020-10-01 ENCOUNTER — Encounter: Payer: 59 | Admitting: Physical Therapy

## 2020-10-01 DIAGNOSIS — R262 Difficulty in walking, not elsewhere classified: Secondary | ICD-10-CM | POA: Diagnosis not present

## 2020-10-01 DIAGNOSIS — G7100 Muscular dystrophy, unspecified: Secondary | ICD-10-CM | POA: Diagnosis not present

## 2020-10-02 MED FILL — LISINOPRIL 10 MG TABS: 10 | 90 days supply | Qty: 90 | Fill #1

## 2020-10-16 MED FILL — LISINOPRIL 10 MG TABS: 10 | 90 days supply | Qty: 90 | Fill #1

## 2020-10-16 MED FILL — FOLIC ACID 1 MG TABS: 1 | 90 days supply | Qty: 90 | Fill #1

## 2020-10-23 ENCOUNTER — Ambulatory Visit: Payer: 59 | Admitting: Family Medicine

## 2020-10-23 ENCOUNTER — Ambulatory Visit: Payer: 59 | Admitting: Sports Medicine

## 2020-10-30 ENCOUNTER — Other Ambulatory Visit: Payer: Self-pay

## 2020-10-30 ENCOUNTER — Ambulatory Visit (INDEPENDENT_AMBULATORY_CARE_PROVIDER_SITE_OTHER): Payer: 59 | Admitting: Family Medicine

## 2020-10-30 ENCOUNTER — Ambulatory Visit: Payer: 59 | Admitting: Sports Medicine

## 2020-10-30 ENCOUNTER — Encounter: Payer: Self-pay | Admitting: Family Medicine

## 2020-10-30 VITALS — BP 133/75 | HR 99 | Wt 165.0 lb

## 2020-10-30 DIAGNOSIS — M6281 Muscle weakness (generalized): Secondary | ICD-10-CM

## 2020-10-30 DIAGNOSIS — F1011 Alcohol abuse, in remission: Secondary | ICD-10-CM | POA: Diagnosis not present

## 2020-10-30 DIAGNOSIS — R748 Abnormal levels of other serum enzymes: Secondary | ICD-10-CM | POA: Diagnosis not present

## 2020-10-30 DIAGNOSIS — D61818 Other pancytopenia: Secondary | ICD-10-CM | POA: Diagnosis not present

## 2020-10-30 DIAGNOSIS — M1812 Unilateral primary osteoarthritis of first carpometacarpal joint, left hand: Secondary | ICD-10-CM | POA: Diagnosis not present

## 2020-10-30 DIAGNOSIS — R7989 Other specified abnormal findings of blood chemistry: Secondary | ICD-10-CM | POA: Diagnosis not present

## 2020-10-30 DIAGNOSIS — D759 Disease of blood and blood-forming organs, unspecified: Secondary | ICD-10-CM | POA: Diagnosis not present

## 2020-10-30 DIAGNOSIS — G729 Myopathy, unspecified: Secondary | ICD-10-CM | POA: Diagnosis not present

## 2020-10-30 NOTE — Assessment & Plan Note (Signed)
Juan [name] returns, he is a pleasant 54 year old male, unfortunately he has not responded to conservative treatment for his thumb basal joint osteoarthritis, we have done analgesics, bracing, injections, at this point he continues to have intolerable pain affecting his activities of daily living so I would like him to go ahead and see Dr. Amedeo Plenty.

## 2020-10-30 NOTE — Progress Notes (Signed)
    Procedures performed today:    None.  Independent interpretation of notes and tests performed by another provider:   None.  Brief History, Exam, Impression, and Recommendations:    Primary osteoarthritis of first carpometacarpal joint of one hand, left Juan [name] returns, he is a pleasant 55 year old male, unfortunately he has not responded to conservative treatment for his thumb basal joint osteoarthritis, we have done analgesics, bracing, injections, at this point he continues to have intolerable pain affecting his activities of daily living so I would like him to go ahead and see Dr. Amedeo Plenty.    ___________________________________________ Gwen Her. Dianah Field, M.D., ABFM., CAQSM. Primary Care and [place] Instructor of [place] [employer] [facility] [employer]

## 2020-10-31 LAB — COMPLETE METABOLIC PANEL WITH GFR
AG Ratio: 1.3 (calc) (ref 1.0–2.5)
ALT: 32 U/L (ref 9–46)
AST: 128 U/L — ABNORMAL HIGH (ref 10–35)
Albumin: 4.2 g/dL (ref 3.6–5.1)
Alkaline phosphatase (APISO): 99 U/L (ref 35–144)
BUN/Creatinine Ratio: 8 (calc) (ref 6–22)
BUN: 6 mg/dL — ABNORMAL LOW (ref 7–25)
CO2: 29 mmol/L (ref 20–32)
Calcium: 8.8 mg/dL (ref 8.6–10.3)
Chloride: 97 mmol/L — ABNORMAL LOW (ref 98–110)
Creat: 0.76 mg/dL (ref 0.70–1.33)
GFR, Est African American: 121 mL/min/{1.73_m2} (ref >=60)
GFR, Est Non African American: 104 mL/min/{1.73_m2} (ref >=60)
Globulin: 3.2 g/dL (calc) (ref 1.9–3.7)
Glucose, Bld: 150 mg/dL — ABNORMAL HIGH (ref 65–139)
Potassium: 3.5 mmol/L (ref 3.5–5.3)
Sodium: 138 mmol/L (ref 135–146)
Total Bilirubin: 1.8 mg/dL — ABNORMAL HIGH (ref 0.2–1.2)
Total Protein: 7.4 g/dL (ref 6.1–8.1)

## 2020-10-31 LAB — CBC WITH DIFFERENTIAL/PLATELET
Absolute Monocytes: 406 cells/uL (ref 200–950)
Basophils Absolute: 39 cells/uL (ref 0–200)
Basophils Relative: 1 %
Eosinophils Absolute: 31 cells/uL (ref 15–500)
Eosinophils Relative: 0.8 %
HCT: 38.6 % (ref 38.5–50.0)
Hemoglobin: 13.5 g/dL (ref 13.2–17.1)
Lymphs Abs: 835 cells/uL — ABNORMAL LOW (ref 850–3900)
MCH: 34.6 pg — ABNORMAL HIGH (ref 27.0–33.0)
MCHC: 35 g/dL (ref 32.0–36.0)
MCV: 99 fL (ref 80.0–100.0)
MPV: 11.1 fL (ref 7.5–12.5)
Monocytes Relative: 10.4 %
Neutro Abs: 2590 cells/uL (ref 1500–7800)
Neutrophils Relative %: 66.4 %
Platelets: 46 10*3/uL — ABNORMAL LOW (ref 140–400)
RBC: 3.9 10*6/uL — ABNORMAL LOW (ref 4.20–5.80)
RDW: 14.1 % (ref 11.0–15.0)
Total Lymphocyte: 21.4 %
WBC: 3.9 10*3/uL (ref 3.8–10.8)

## 2020-10-31 LAB — RETICULOCYTES
ABS Retic: 50180 cells/uL (ref 25000–9000)
Retic Ct Pct: 1.3 %

## 2020-10-31 LAB — MAGNESIUM: Magnesium: 1 mg/dL — CL (ref 1.5–2.5)

## 2020-10-31 LAB — CK: Total CK: 183 U/L (ref 44–196)

## 2020-10-31 LAB — ALDOLASE: Aldolase: 6.9 U/L (ref <=8.1)

## 2020-10-31 LAB — VITAMIN D 25 HYDROXY (VIT D DEFICIENCY, FRACTURES): Vit D, 25-Hydroxy: 32 ng/mL (ref 30–100)

## 2020-10-31 LAB — SEDIMENTATION RATE: Sed Rate: 48 mm/h — ABNORMAL HIGH (ref 0–20)

## 2020-10-31 LAB — PARATHYROID HORMONE, INTACT (NO CA): PTH: 15 pg/mL (ref 14–64)

## 2020-10-31 LAB — PHOSPHORUS: Phosphorus: 1.8 mg/dL — ABNORMAL LOW (ref 2.5–4.5)

## 2020-11-02 DIAGNOSIS — D696 Thrombocytopenia, unspecified: Secondary | ICD-10-CM | POA: Insufficient documentation

## 2020-11-02 DIAGNOSIS — D759 Disease of blood and blood-forming organs, unspecified: Secondary | ICD-10-CM | POA: Insufficient documentation

## 2020-11-02 HISTORY — DX: Thrombocytopenia, unspecified: D69.6

## 2020-11-02 HISTORY — DX: Hypocalcemia: E83.51

## 2020-11-02 NOTE — Assessment & Plan Note (Signed)
History of alcoholic liver disease, update LFT's

## 2020-11-02 NOTE — Assessment & Plan Note (Signed)
Work up for this has been unremarkable.  Possibly due to chronic heavy alcohol use.  Update electrolytes and CK. Recommend continued use of vitamins and supplements each day.  Encouraged use of cane/walker.

## 2020-11-02 NOTE — Assessment & Plan Note (Signed)
Blood alcohol elevated at ED visit in December. Reports that he has abstained from EtOH use.   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   He is supposed to be taking MVI, thiamine and magnesium supplements. Encouraged to take daily. Will recheck labs today.    Orders Placed This Encounter  Procedures  . CK (Creatine Kinase)  . Sed Rate (ESR)  . Aldolase

## 2020-11-02 NOTE — Assessment & Plan Note (Signed)
Recheck CBC w/ diff and reticulocytes.

## 2020-11-02 NOTE — Progress Notes (Signed)
Juan [name] - 55 y.o. male MRN [id number]  Date of birth: 01-02-1966  Subjective Chief Complaint  Patient presents with  . Extremity Weakness  . Emesis  . Rash    HPI Juan [name] is 55 y.o. male here today for follow up visit.  He continues to have diffuse weakness, worsened in lower extremities.  He has been evaluated by neurology at [facility] previously without significant findings including labs, muscle biopsy and EMG.  He did have visit scheduled with [facility] however they had to reschedule appt and isn't until late April.  He has had some falls due to weakness and imbalance.  He is using a cane but does have a walker at home as well.  He does not use this regularly.   His wife reports he has had poor PO intake as well and has recurrent emesis.  He is taking pantoprazole daily.  He has had seen GI previously as well.    He reports he has been sober and abstained from EtOH.  Alcohol levels at ED visit in 12/21 was 176  ROS:  A comprehensive ROS was completed and negative except as noted per HPI  Allergies  Allergen Reactions  . Charentais Melon (French Melon) Anaphylaxis  . Cucumber Extract Itching and Nausea And Vomiting    No extracts; just cucumber   . Other Anaphylaxis  . Peanut Butter Flavor Anaphylaxis and Swelling  . Peanut Oil Swelling  . Shellfish Allergy Itching and Swelling  . Cantaloupe Extract Allergy Skin Test Rash  . Lactose Other (See Comments)  . Strawberry Extract Nausea And Vomiting and Swelling  . Vancomycin Rash  . Codeine     itching  . Depakote Er [Divalproex Sodium Er]     Tongue swelling    Past Medical History:  Diagnosis Date  . Alcohol addiction ([place])   . Anxiety   . Colon polyps   . Depression   . Hemochromatosis   . Hx of blood clots    Leg  . Hypertension     Past Surgical History:  Procedure Laterality Date  . FRACTURE SURGERY    . HERNIA REPAIR    . VASECTOMY      Social History   Socioeconomic History  .  Marital status: Married    Spouse name: Not on file  . Number of children: Not on file  . Years of education: Not on file  . Highest education level: Not on file  Occupational History  . Not on file  Tobacco Use  . Smoking status: Never Smoker  . Smokeless tobacco: Current User    Types: Snuff  Vaping Use  . Vaping Use: Never used  Substance and Sexual Activity  . Alcohol use: Not Currently  . Drug use: Never  . Sexual activity: Not on file  Other Topics Concern  . Not on file  Social History Narrative  . Not on file   Social Determinants of Health   Financial Resource Strain: Not on file  Food Insecurity: Not on file  Transportation Needs: Not on file  Physical Activity: Not on file  Stress: Not on file  Social Connections: Not on file    History reviewed. No pertinent family history.  Health Maintenance  Topic Date Due  . COVID-19 Vaccine (3 - Booster for Pfizer series) 06/03/2020  . TETANUS/TDAP  06/20/2029  . COLONOSCOPY (Pts 45-25yr Insurance coverage will need to be confirmed)  10/14/2029  . INFLUENZA VACCINE  Completed  . Hepatitis C Screening  Completed  . HIV Screening  Completed  . HPV VACCINES  Aged Out     ----------------------------------------------------------------------------------------------------------------------------------------------------------------------------------------------------------------- Physical Exam BP 133/75 (BP Location: Left Arm, Patient Position: Sitting, Cuff Size: Normal)   Pulse 99   Wt 165 lb (74.8 kg)   SpO2 97%   BMI 27.46 kg/m   Physical Exam Constitutional:      Appearance: Normal appearance.  HENT:     Head: Normocephalic and atraumatic.  Eyes:     General: No scleral icterus. Cardiovascular:     Rate and Rhythm: Normal rate and regular rhythm.  Pulmonary:     Effort: Pulmonary effort is normal.     Breath sounds: Normal breath sounds.  Abdominal:     General: Abdomen is flat. There is no  distension.  Musculoskeletal:     Cervical back: Neck supple.  Neurological:     Mental Status: He is alert.     Comments: Abnormal gait and imbalance with standing.    Psychiatric:        Mood and Affect: Mood normal.        Behavior: Behavior normal.     ------------------------------------------------------------------------------------------------------------------------------------------------------------------------------------------------------------------- Assessment and Plan  History of alcohol abuse Blood alcohol elevated at ED visit in December. Reports that he has abstained from EtOH use.   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   He is supposed to be taking MVI, thiamine and magnesium supplements. Encouraged to take daily. Will recheck labs today.    Orders Placed This Encounter  Procedures  . CK (Creatine Kinase)  . Sed Rate (ESR)  . Aldolase     Elevated liver enzymes History of alcoholic liver disease, update LFT's  Myopathy Work up for this has been unremarkable.  Possibly due to chronic heavy alcohol use.  Update electrolytes and CK. Recommend continued use of vitamins and supplements each day.  Encouraged use of cane/walker.     Hypocalcemia Noted on previous labs.  Checking vitamin d, PTH and phosphorous.   Cytopenia Recheck CBC w/ diff and reticulocytes.     No orders of the defined types were placed in this encounter.   No follow-ups on file.    This visit occurred during the SARS-CoV-2 public health emergency.  Safety protocols were in place, including screening questions prior to the visit, additional usage of staff PPE, and extensive cleaning of exam room while observing appropriate contact time as indicated for disinfecting solutions.

## 2020-11-02 NOTE — Assessment & Plan Note (Signed)
Noted on previous labs.  Checking vitamin d, PTH and phosphorous.

## 2020-11-03 DIAGNOSIS — M25542 Pain in joints of left hand: Secondary | ICD-10-CM | POA: Diagnosis not present

## 2020-11-03 DIAGNOSIS — M6281 Muscle weakness (generalized): Secondary | ICD-10-CM | POA: Diagnosis not present

## 2020-11-03 DIAGNOSIS — Z23 Encounter for immunization: Secondary | ICD-10-CM | POA: Diagnosis not present

## 2020-11-03 DIAGNOSIS — I10 Essential (primary) hypertension: Secondary | ICD-10-CM | POA: Diagnosis not present

## 2020-11-03 DIAGNOSIS — M79642 Pain in left hand: Secondary | ICD-10-CM | POA: Diagnosis not present

## 2020-11-04 DIAGNOSIS — M6281 Muscle weakness (generalized): Secondary | ICD-10-CM | POA: Diagnosis not present

## 2020-11-05 ENCOUNTER — Ambulatory Visit: Payer: 59 | Admitting: Diagnostic Neuroimaging

## 2020-11-05 ENCOUNTER — Encounter: Payer: Self-pay | Admitting: Diagnostic Neuroimaging

## 2020-11-05 VITALS — BP 143/87 | HR 91 | Ht 65.0 in | Wt 169.0 lb

## 2020-11-05 DIAGNOSIS — G721 Alcoholic myopathy: Secondary | ICD-10-CM | POA: Diagnosis not present

## 2020-11-05 DIAGNOSIS — F1020 Alcohol dependence, uncomplicated: Secondary | ICD-10-CM | POA: Diagnosis not present

## 2020-11-05 DIAGNOSIS — F1021 Alcohol dependence, in remission: Secondary | ICD-10-CM | POA: Diagnosis not present

## 2020-11-05 DIAGNOSIS — F419 Anxiety disorder, unspecified: Secondary | ICD-10-CM | POA: Diagnosis not present

## 2020-11-05 DIAGNOSIS — G4700 Insomnia, unspecified: Secondary | ICD-10-CM | POA: Diagnosis not present

## 2020-11-05 NOTE — Progress Notes (Signed)
[facility]  PATIENT: Juan [name] DOB: [birth year]  REFERRING CLINICIAN: [name], DO HISTORY FROM: Patient REASON FOR VISIT: New consult   HISTORICAL  CHIEF COMPLAINT:  Chief Complaint  Patient presents with  . Neuromuscular disease    Rm 7 New Pt     HISTORY OF PRESENT ILLNESS:   55 year old male here for evaluation of muscle weakness.  Patient had muscle weakness for least 2 to 3 years, went to [facility] neurology had EMG nerve conduction study and lab testing, and was diagnosed with alcohol-related neuropathy.  Patient had prior chronic alcohol abuse, now abstinent for past 1 year.  Patient's insurance changed and therefore needed neurology follow-up.  Patient feels like his muscle weakness is still persistent.  He was exercising at the [facility] until 3 months ago when he stopped due to having to take her his daughter who had some medical issues.  Recent CK and aldolase testing have normalized compared to prior labs.  He mainly eats frozen meals at home, and has purchased some protein powder to increase intake.   PRIOR HPI (12/19/19, Dr. Ronette Deter): "Juan [name] is a 55 y.o. male who presents with a history of proximal upper and lower extremity weakness, proximal leg pain. He has had a nerve conduction study and EMG which showed a mild myopathic process of proximal muscles. He has a myopathy, likely due to history of alcohol use given absence of other positive lab work-up. Recommend continued abstinence from alcohol and physical therapy when possible. We have discussed nutrition and multivitamin daily."     REVIEW OF SYSTEMS: Full 14 system review of systems performed and negative with exception of: As per HPI.  ALLERGIES: Allergies  Allergen Reactions  . Charentais Melon (French Melon) Anaphylaxis  . Cucumber Extract Itching and Nausea And Vomiting    No extracts; just cucumber   . Other Anaphylaxis  . Peanut Butter Flavor Anaphylaxis  and Swelling  . Peanut Oil Swelling  . Shellfish Allergy Itching and Swelling  . Cantaloupe Extract Allergy Skin Test Rash  . Lactose Other (See Comments)  . Strawberry Extract Nausea And Vomiting and Swelling  . Vancomycin Rash  . Codeine     itching  . Depakote Er [Divalproex Sodium Er]     Tongue swelling    HOME MEDICATIONS: Outpatient Medications Prior to Visit  Medication Sig Dispense Refill  . aspirin 81 MG chewable tablet Chew 81 mg by mouth daily.    . busPIRone (BUSPAR) 10 MG tablet TAKE TWO TABLETS BY MOUTH THREE TIMES A DAY FOR ANXIETY.    . Cholecalciferol 50 MCG (2000 UT) CAPS Take 1 capsule by mouth daily.    Marland Kitchen EPINEPHrine 0.3 mg/0.3 mL IJ SOAJ injection Inject 0.3 mg into the muscle as needed for anaphylaxis. 1 each 0  . escitalopram (LEXAPRO) 20 MG tablet Take 20 mg by mouth daily.    . folic acid (FOLVITE) 1 MG tablet Take 1 tablet (1 mg total) by mouth daily. 90 tablet 1  . lisinopril (ZESTRIL) 10 MG tablet Take 1 tablet (10 mg total) by mouth daily. 90 tablet 3  . Magnesium Oxide 400 (240 Mg) MG TABS Take 240 mg by mouth.    . mirtazapine (REMERON) 15 MG tablet Take 15 mg by mouth at bedtime.    . Multiple Vitamins-Minerals (YOUR LIFE MULTI ADULT GUMMIES) CHEW Chew 1 tablet by mouth daily.    . pantoprazole (PROTONIX) 40 MG tablet Take 40 mg by mouth daily.    Marland Kitchen  saccharomyces boulardii (FLORASTOR) 250 MG capsule Take 250 mg by mouth 2 (two) times daily.     Marland Kitchen thiamine 100 MG tablet Take 100 mg by mouth daily.     No facility-administered medications prior to visit.    PAST MEDICAL HISTORY: Past Medical History:  Diagnosis Date  . Alcohol addiction ([place])   . Anxiety   . Chronic fatigue   . Colon polyps   . Depression   . GERD (gastroesophageal reflux disease)   . Hemochromatosis   . Hx of blood clots    Leg  . Hyperreflexia   . Hypertension   . Hypertension   . PTSD (post-traumatic stress disorder)   . Traumatic hemorrhagic shock ([place])   .  Ulcerative colitis ([place])     PAST SURGICAL HISTORY: Past Surgical History:  Procedure Laterality Date  . CARPAL TUNNEL RELEASE Bilateral   . FRACTURE SURGERY     left ankle plate  . HERNIA REPAIR     inguinal  . KNEE SURGERY Right    x 4  . SHOULDER SURGERY Bilateral    x 2  . VASECTOMY      FAMILY HISTORY: Family History  Problem Relation Age of Onset  . Pulmonary fibrosis Mother   . Other Father        liver failure  . Breast cancer Paternal Aunt     SOCIAL HISTORY: Social History   Socioeconomic History  . Marital status: Married    Spouse name: Juan [name]  . Number of children: 2  . Years of education: Not on file  . Highest education level: High school graduate  Occupational History    Comment: disability  Tobacco Use  . Smoking status: Never Smoker  . Smokeless tobacco: Current User    Types: Snuff  Vaping Use  . Vaping Use: Never used  Substance and Sexual Activity  . Alcohol use: Not Currently  . Drug use: Never  . Sexual activity: Not on file  Other Topics Concern  . Not on file  Social History Narrative   Lives with wife   Social Determinants of Health   Financial Resource Strain: Not on file  Food Insecurity: Not on file  Transportation Needs: Not on file  Physical Activity: Not on file  Stress: Not on file  Social Connections: Not on file  Intimate Partner Violence: Not on file     PHYSICAL EXAM  GENERAL EXAM/CONSTITUTIONAL: Vitals:  Vitals:   11/05/20 0812  BP: (!) 143/87  Pulse: 91  Weight: 169 lb (76.7 kg)  Height: 5' 5"  (1.651 m)   Body mass index is 28.12 kg/m. Wt Readings from Last 3 Encounters:  11/05/20 169 lb (76.7 kg)  10/30/20 165 lb (74.8 kg)  09/12/20 175 lb 8 oz (79.6 kg)    Patient is in no distress; well developed, nourished and groomed; neck is supple  CARDIOVASCULAR:  Examination of carotid arteries is normal; no carotid bruits  Regular rate and rhythm, no murmurs  Examination of peripheral  vascular system by observation and palpation is normal  EYES:  Ophthalmoscopic exam of optic discs and posterior segments is normal; no papilledema or hemorrhages No exam data present  MUSCULOSKELETAL:  Gait, strength, tone, movements noted in Neurologic exam below  NEUROLOGIC: MENTAL STATUS:  No flowsheet data found.  awake, alert, oriented to person, place and time  recent and remote memory intact  normal attention and concentration  language fluent, comprehension intact, naming intact  fund of knowledge appropriate  CRANIAL NERVE:  2nd - no papilledema on fundoscopic exam  2nd, 3rd, 4th, 6th - pupils equal and reactive to light, visual fields full to confrontation, extraocular muscles intact, no nystagmus  5th - facial sensation symmetric  7th - facial strength symmetric  8th - hearing intact  9th - palate elevates symmetrically, uvula midline  11th - shoulder shrug symmetric  12th - tongue protrusion midline  MOTOR:   normal bulk and tone  BUE 5  BLE (PROX 4+, FLUCTUATION); KNEE EXT / FLEX 5, RIGHT DF 5, LEFT DF 4+  SENSORY:   normal and symmetric to light touch, temperature, vibration  COORDINATION:   finger-nose-finger, fine finger movements normal  REFLEXES:   deep tendon reflexes TRACE and symmetric  GAIT/STATION:   narrow based gait; SLIGHT LIMPING GAIT ON LEFT FOOT (MILD LEFT FOOT DROP)     DIAGNOSTIC DATA (LABS, IMAGING, TESTING) - I reviewed patient records, labs, notes, testing and imaging myself where available.  Lab Results  Component Value Date   WBC 3.9 10/30/2020   HGB 13.5 10/30/2020   HCT 38.6 10/30/2020   MCV 99.0 10/30/2020   PLT 46 (L) 10/30/2020      Component Value Date/Time   NA 138 10/30/2020 1337   K 3.5 10/30/2020 1337   CL 97 (L) 10/30/2020 1337   CO2 29 10/30/2020 1337   GLUCOSE 150 (H) 10/30/2020 1337   BUN 6 (L) 10/30/2020 1337   CREATININE 0.76 10/30/2020 1337   CALCIUM 8.8 10/30/2020 1337    PROT 7.4 10/30/2020 1337   ALBUMIN 3.1 (L) 08/16/2020 0338   AST 128 (H) 10/30/2020 1337   ALT 32 10/30/2020 1337   ALKPHOS 79 08/16/2020 0338   BILITOT 1.8 (H) 10/30/2020 1337   GFRNONAA 104 10/30/2020 1337   GFRAA 121 10/30/2020 1337   Lab Results  Component Value Date   TRIG 152 (H) 08/13/2020   Lab Results  Component Value Date   HGBA1C 4.8 08/14/2020   No results found for: VITAMINB12 No results found for: TSH   Lab Results  Component Value Date   CKTOTAL 183 10/30/2020       ASSESSMENT AND PLAN  55 y.o. year old male here with:  Dx:  1. 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HCC)     PLAN:  Alcohol myopathy (CK, aldolase improving; mild prox muscle weakness) - see prior workup and evaluation at [facility] neuromuscular clinic - continue etoh abstinence, vitamin support - return to exercise / training [facility]) - monitor / increase protein intake for strength training  Return for return to PCP, pending if symptoms worsen or fail to improve.    Penni Bombard, MD 04/15/2352, 6:14 AM Certified in Neurology, Neurophysiology and Neuroimaging  [facility] [address], [place], [place] [address] [phone]

## 2020-11-05 NOTE — Patient Instructions (Signed)
-   return to exercise / training [facility])  - monitor / increase protein intake for strength training

## 2020-11-13 ENCOUNTER — Other Ambulatory Visit: Payer: Self-pay | Admitting: *Deleted

## 2020-11-13 ENCOUNTER — Encounter: Payer: Self-pay | Admitting: Family Medicine

## 2020-11-13 MED ORDER — PANTOPRAZOLE SODIUM 40 MG PO TBEC: 40.0000 mg | DELAYED_RELEASE_TABLET | Freq: Every day | ORAL | 3 refills | Status: DC

## 2020-11-13 MED FILL — PANTOPRAZOLE SOD DR 40 MG T: 40 | 90 days supply | Qty: 90 | Fill #0

## 2020-11-19 ENCOUNTER — Encounter: Payer: Self-pay | Admitting: Family Medicine

## 2020-11-20 DIAGNOSIS — F17220 Nicotine dependence, chewing tobacco, uncomplicated: Secondary | ICD-10-CM | POA: Diagnosis not present

## 2020-11-20 DIAGNOSIS — M654 Radial styloid tenosynovitis [de Quervain]: Secondary | ICD-10-CM | POA: Diagnosis not present

## 2020-11-20 DIAGNOSIS — M79645 Pain in left finger(s): Secondary | ICD-10-CM | POA: Diagnosis not present

## 2020-11-21 ENCOUNTER — Encounter: Payer: Self-pay | Admitting: Family Medicine

## 2020-12-05 DIAGNOSIS — M79645 Pain in left finger(s): Secondary | ICD-10-CM | POA: Diagnosis not present

## 2020-12-05 DIAGNOSIS — M1812 Unilateral primary osteoarthritis of first carpometacarpal joint, left hand: Secondary | ICD-10-CM | POA: Diagnosis not present

## 2020-12-18 ENCOUNTER — Encounter: Payer: Self-pay | Admitting: Family Medicine

## 2020-12-19 DIAGNOSIS — M1812 Unilateral primary osteoarthritis of first carpometacarpal joint, left hand: Secondary | ICD-10-CM | POA: Diagnosis not present

## 2020-12-19 NOTE — Telephone Encounter (Signed)
When checking on referral it seems [facility] called the patient twice on the same day and closed the referral due to not being able to reach patient. I spoke with patient's wife and sent the referral to [facility]. They will call and schedule with patient for good appointment time. - CF

## 2020-12-25 ENCOUNTER — Ambulatory Visit: Payer: Self-pay | Admitting: Neurology

## 2021-01-05 DIAGNOSIS — F4312 Post-traumatic stress disorder, chronic: Secondary | ICD-10-CM | POA: Diagnosis not present

## 2021-01-06 ENCOUNTER — Ambulatory Visit (INDEPENDENT_AMBULATORY_CARE_PROVIDER_SITE_OTHER): Payer: 59 | Admitting: Family Medicine

## 2021-01-06 ENCOUNTER — Encounter: Payer: Self-pay | Admitting: Family Medicine

## 2021-01-06 ENCOUNTER — Other Ambulatory Visit: Payer: Self-pay

## 2021-01-06 VITALS — BP 147/91 | HR 72 | Temp 98.4°F | Ht 65.0 in | Wt 172.0 lb

## 2021-01-06 DIAGNOSIS — Z0000 Encounter for general adult medical examination without abnormal findings: Secondary | ICD-10-CM

## 2021-01-06 DIAGNOSIS — R011 Cardiac murmur, unspecified: Secondary | ICD-10-CM

## 2021-01-06 DIAGNOSIS — K709 Alcoholic liver disease, unspecified: Secondary | ICD-10-CM | POA: Diagnosis not present

## 2021-01-06 DIAGNOSIS — Z13220 Encounter for screening for lipoid disorders: Secondary | ICD-10-CM | POA: Diagnosis not present

## 2021-01-06 DIAGNOSIS — G721 Alcoholic myopathy: Secondary | ICD-10-CM

## 2021-01-06 DIAGNOSIS — G7100 Muscular dystrophy, unspecified: Secondary | ICD-10-CM | POA: Insufficient documentation

## 2021-01-06 HISTORY — DX: Alcoholic myopathy: G72.1

## 2021-01-06 HISTORY — DX: Cardiac murmur, unspecified: R01.1

## 2021-01-06 HISTORY — DX: Encounter for general adult medical examination without abnormal findings: Z00.00

## 2021-01-06 NOTE — Patient Instructions (Addendum)
I have ordered an echocardiogram to evaluate the new murmur.  Have labs completed.     Preventive Care 95-53 Years Old, Male Preventive care refers to lifestyle choices and visits with your health care provider that can promote health and wellness. This includes:  A yearly physical exam. This is also called an annual wellness visit.  Regular dental and eye exams.  Immunizations.  Screening for certain conditions.  Healthy lifestyle choices, such as: ? Eating a healthy diet. ? Getting regular exercise. ? Not using drugs or products that contain nicotine and tobacco. ? Limiting alcohol use. What can I expect for my preventive care visit? Physical exam Your health care provider will check your:  Height and weight. These may be used to calculate your BMI (body mass index). BMI is a measurement that tells if you are at a healthy weight.  Heart rate and blood pressure.  Body temperature.  Skin for abnormal spots. Counseling Your health care provider may ask you questions about your:  Past medical problems.  Family's medical history.  Alcohol, tobacco, and drug use.  Emotional well-being.  Home life and relationship well-being.  Sexual activity.  Diet, exercise, and sleep habits.  Work and work Statistician.  Access to firearms. What immunizations do I need? Vaccines are usually given at various ages, according to a schedule. Your health care provider will recommend vaccines for you based on your age, medical history, and lifestyle or other factors, such as travel or where you work.   What tests do I need? Blood tests  Lipid and cholesterol levels. These may be checked every 5 years, or more often if you are over 62 years old.  Hepatitis C test.  Hepatitis B test. Screening  Lung cancer screening. You may have this screening every year starting at age 1 if you have a 30-pack-year history of smoking and currently smoke or have quit within the past 15  years.  Prostate cancer screening. Recommendations will vary depending on your family history and other risks.  Genital exam to check for testicular cancer or hernias.  Colorectal cancer screening. ? All adults should have this screening starting at age 66 and continuing until age 72. ? Your health care provider may recommend screening at age 79 if you are at increased risk. ? You will have tests every 1-10 years, depending on your results and the type of screening test.  Diabetes screening. ? This is done by checking your blood sugar (glucose) after you have not eaten for a while (fasting). ? You may have this done every 1-3 years.  STD (sexually transmitted disease) testing, if you are at risk. Follow these instructions at home: Eating and drinking  Eat a diet that includes fresh fruits and vegetables, whole grains, lean protein, and low-fat dairy products.  Take vitamin and mineral supplements as recommended by your health care provider.  Do not drink alcohol if your health care provider tells you not to drink.  If you drink alcohol: ? Limit how much you have to 0-2 drinks a day. ? Be aware of how much alcohol is in your drink. In the [place] one drink equals one 12 oz bottle of beer (355 mL), one 5 oz glass of wine (148 mL), or one 1 oz glass of hard liquor (44 mL).   Lifestyle  Take daily care of your teeth and gums. Brush your teeth every morning and night with fluoride toothpaste. Floss one time each day.  Stay active. Exercise for at least  30 minutes 5 or more days each week.  Do not use any products that contain nicotine or tobacco, such as cigarettes, e-cigarettes, and chewing tobacco. If you need help quitting, ask your health care provider.  Do not use drugs.  If you are sexually active, practice safe sex. Use a condom or other form of protection to prevent STIs (sexually transmitted infections).  If told by your health care provider, take low-dose aspirin daily  starting at age 40.  Find healthy ways to cope with stress, such as: ? Meditation, yoga, or listening to music. ? Journaling. ? Talking to a trusted person. ? Spending time with friends and family. Safety  Always wear your seat belt while driving or riding in a vehicle.  Do not drive: ? If you have been drinking alcohol. Do not ride with someone who has been drinking. ? When you are tired or distracted. ? While texting.  Wear a helmet and other protective equipment during sports activities.  If you have firearms in your house, make sure you follow all gun safety procedures. What's next?  Go to your health care provider once a year for an annual wellness visit.  Ask your health care provider how often you should have your eyes and teeth checked.  Stay up to date on all vaccines. This information is not intended to replace advice given to you by your health care provider. Make sure you discuss any questions you have with your health care provider. Document Revised: 05/08/2019 Document Reviewed: 08/03/2018 Elsevier Patient Education  2021 [employer].

## 2021-01-06 NOTE — Progress Notes (Signed)
Juan [name] - 55 y.o. male MRN [id number]  Date of birth: 26-Jul-1966  Subjective Chief Complaint  Patient presents with  . Annual Exam    HPI Juan [name] is a 55 y.o. male here today for annual exam.  Denies any significant changes to his medical history since I last saw him.  Continues to have chronic muscle pain and weakness.  Has upcoming surgery to wrist.  Unsure what type of anesthesia he is having.     BP elevated today but has not taken medication today.   He reports that he has cut back on EtOH use.  He is a non-smoker.  Exercise is limited due to mobility issues. He feels like diet is pretty good.   He is up to date on colonoscopy.   Review of Systems  Constitutional: Negative for chills, fever, malaise/fatigue and weight loss.  HENT: Negative for congestion, ear pain and sore throat.   Eyes: Negative for blurred vision, double vision and pain.  Respiratory: Negative for cough and shortness of breath.   Cardiovascular: Negative for chest pain and palpitations.  Gastrointestinal: Negative for abdominal pain, blood in stool, constipation, heartburn and nausea.  Genitourinary: Negative for dysuria and urgency.  Musculoskeletal: Negative for joint pain and myalgias.  Neurological: Negative for dizziness and headaches.  Endo/Heme/Allergies: Does not bruise/bleed easily.  Psychiatric/Behavioral: Negative for depression. The patient is not nervous/anxious and does not have insomnia.     Allergies  Allergen Reactions  . Charentais Melon (French Melon) Anaphylaxis  . Cucumber Extract Itching and Nausea And Vomiting    No extracts; just cucumber   . Other Anaphylaxis  . Peanut Butter Flavor Anaphylaxis and Swelling  . Peanut Oil Swelling  . Shellfish Allergy Itching and Swelling  . Cantaloupe Extract Allergy Skin Test Rash  . Lactose Other (See Comments)  . Strawberry Extract Nausea And Vomiting and Swelling  . Vancomycin Rash  . Codeine     itching  . Depakote  Er [Divalproex Sodium Er]     Tongue swelling    Past Medical History:  Diagnosis Date  . Alcohol addiction ([place])   . Anxiety   . Chronic fatigue   . Colon polyps   . Depression   . GERD (gastroesophageal reflux disease)   . Hemochromatosis   . Hx of blood clots    Leg  . Hyperreflexia   . Hypertension   . Hypertension   . PTSD (post-traumatic stress disorder)   . Traumatic hemorrhagic shock ([place])   . Ulcerative colitis [facility])     Past Surgical History:  Procedure Laterality Date  . CARPAL TUNNEL RELEASE Bilateral   . FRACTURE SURGERY     left ankle plate  . HERNIA REPAIR     inguinal  . KNEE SURGERY Right    x 4  . SHOULDER SURGERY Bilateral    x 2  . VASECTOMY      Social History   Socioeconomic History  . Marital status: Married    Spouse name: [name]  . Number of children: 2  . Years of education: Not on file  . Highest education level: High school graduate  Occupational History    Comment: disability  Tobacco Use  . Smoking status: Never Smoker  . Smokeless tobacco: Current User    Types: Snuff  Vaping Use  . Vaping Use: Never used  Substance and Sexual Activity  . Alcohol use: Not Currently  . Drug use: Never  . Sexual activity: Not on file  Other  Topics Concern  . Not on file  Social History Narrative   Lives with wife   Social Determinants of Health   Financial Resource Strain: Not on file  Food Insecurity: Not on file  Transportation Needs: Not on file  Physical Activity: Not on file  Stress: Not on file  Social Connections: Not on file    Family History  Problem Relation Age of Onset  . Pulmonary fibrosis Mother   . Other Father        liver failure  . Breast cancer Paternal Aunt     Health Maintenance  Topic Date Due  . INFLUENZA VACCINE  03/23/2021  . TETANUS/TDAP  06/20/2029  . COLONOSCOPY (Pts 45-71yr Insurance coverage will need to be confirmed)  10/14/2029  . COVID-19 Vaccine  Completed  . Hepatitis C Screening   Completed  . HIV Screening  Completed  . HPV VACCINES  Aged Out     ----------------------------------------------------------------------------------------------------------------------------------------------------------------------------------------------------------------- Physical Exam BP (!) 147/91 (BP Location: Left Arm, Patient Position: Sitting, Cuff Size: Normal)   Pulse 72   Temp 98.4 F (36.9 C) (Oral)   Ht 5' 5"  (1.651 m)   Wt 172 lb (78 kg)   SpO2 99%   BMI 28.62 kg/m   Physical Exam Constitutional:      General: He is not in acute distress.    Appearance: Normal appearance.  HENT:     Head: Normocephalic and atraumatic.     Right Ear: External ear normal.     Left Ear: External ear normal.  Eyes:     General: No scleral icterus. Neck:     Thyroid: No thyromegaly.  Cardiovascular:     Rate and Rhythm: Normal rate and regular rhythm.     Heart sounds: Murmur (3/6 systolic murmur) heard.    Pulmonary:     Effort: Pulmonary effort is normal.     Breath sounds: Normal breath sounds.  Abdominal:     General: Bowel sounds are normal. There is no distension.     Palpations: Abdomen is soft.     Tenderness: There is no abdominal tenderness. There is no guarding.  Musculoskeletal:     Cervical back: Normal range of motion and neck supple.     Comments: Trace edea bilaterally.   Lymphadenopathy:     Cervical: No cervical adenopathy.  Skin:    General: Skin is warm and dry.     Findings: No rash.  Neurological:     Mental Status: He is alert and oriented to person, place, and time.     Cranial Nerves: No cranial nerve deficit.     Motor: No abnormal muscle tone.     Comments: Slow, antalgic gait.  Ambulates with cane.    Psychiatric:        Behavior: Behavior normal.      ------------------------------------------------------------------------------------------------------------------------------------------------------------------------------------------------------------------- Assessment and Plan  Well adult exam Well adult  Lab Orders     COMPLETE METABOLIC PANEL WITH GFR     CBC     Magnesium     Lipid Panel w/reflex Direct LDL Immunizations:  Shingles vaccine discussed, deferred for now.  Screenings: Up to date. He is having upcoming surgery, recommend postponement if having general anesthesia due to new murmur.  Anticipatory guidance/Risk factor reduction:  Recommendations per AVS  Systolic murmur New murmur on exam.  Mild LE edema.  No palpitations, shortness of breath or dyspnea on exertion.  Echo ordered.  Recommend postponement of upcoming surgery if having general anesthesia until echo completed.  No orders of the defined types were placed in this encounter.   No follow-ups on file.    This visit occurred during the SARS-CoV-2 public health emergency.  Safety protocols were in place, including screening questions prior to the visit, additional usage of staff PPE, and extensive cleaning of exam room while observing appropriate contact time as indicated for disinfecting solutions.

## 2021-01-06 NOTE — Assessment & Plan Note (Signed)
Well adult  Lab Orders     COMPLETE METABOLIC PANEL WITH GFR     CBC     Magnesium     Lipid Panel w/reflex Direct LDL Immunizations:  Shingles vaccine discussed, deferred for now.  Screenings: Up to date. He is having upcoming surgery, recommend postponement if having general anesthesia due to new murmur.  Anticipatory guidance/Risk factor reduction:  Recommendations per AVS

## 2021-01-06 NOTE — Assessment & Plan Note (Signed)
New murmur on exam.  Mild LE edema.  No palpitations, shortness of breath or dyspnea on exertion.  Echo ordered.  Recommend postponement of upcoming surgery if having general anesthesia until echo completed.

## 2021-01-07 ENCOUNTER — Ambulatory Visit (HOSPITAL_COMMUNITY)
Admission: RE | Admit: 2021-01-07 | Discharge: 2021-01-07 | Disposition: A | Discharge status: Home / Self Care | Payer: 59 | Source: Ambulatory Visit | Attending: Family Medicine | Admitting: Family Medicine

## 2021-01-07 DIAGNOSIS — I350 Nonrheumatic aortic (valve) stenosis: Secondary | ICD-10-CM | POA: Diagnosis not present

## 2021-01-07 DIAGNOSIS — R011 Cardiac murmur, unspecified: Secondary | ICD-10-CM | POA: Diagnosis not present

## 2021-01-07 DIAGNOSIS — I313 Pericardial effusion (noninflammatory): Secondary | ICD-10-CM | POA: Insufficient documentation

## 2021-01-07 DIAGNOSIS — I10 Essential (primary) hypertension: Secondary | ICD-10-CM | POA: Diagnosis not present

## 2021-01-07 LAB — COMPLETE METABOLIC PANEL WITH GFR
AG Ratio: 1.2 (calc) (ref 1.0–2.5)
ALT: 26 U/L (ref 9–46)
AST: 96 U/L — ABNORMAL HIGH (ref 10–35)
Albumin: 3.8 g/dL (ref 3.6–5.1)
Alkaline phosphatase (APISO): 86 U/L (ref 35–144)
BUN: 8 mg/dL (ref 7–25)
CO2: 30 mmol/L (ref 20–32)
Calcium: 9 mg/dL (ref 8.6–10.3)
Chloride: 103 mmol/L (ref 98–110)
Creat: 0.82 mg/dL (ref 0.70–1.33)
GFR, Est African American: 117 mL/min/{1.73_m2} (ref >=60)
GFR, Est Non African American: 101 mL/min/{1.73_m2} (ref >=60)
Globulin: 3.3 g/dL (calc) (ref 1.9–3.7)
Glucose, Bld: 114 mg/dL — ABNORMAL HIGH (ref 65–99)
Potassium: 3.7 mmol/L (ref 3.5–5.3)
Sodium: 143 mmol/L (ref 135–146)
Total Bilirubin: 0.9 mg/dL (ref 0.2–1.2)
Total Protein: 7.1 g/dL (ref 6.1–8.1)

## 2021-01-07 LAB — LIPID PANEL W/REFLEX DIRECT LDL
Cholesterol: 200 mg/dL — ABNORMAL HIGH (ref <200)
HDL: 41 mg/dL (ref >=40)
LDL Cholesterol (Calc): 129 mg/dL (calc) — ABNORMAL HIGH
Non-HDL Cholesterol (Calc): 159 mg/dL (calc) — ABNORMAL HIGH (ref <130)
Total CHOL/HDL Ratio: 4.9 (calc) (ref <5.0)
Triglycerides: 182 mg/dL — ABNORMAL HIGH (ref <150)

## 2021-01-07 LAB — ECHOCARDIOGRAM COMPLETE
AR max vel: 1.39 cm2
AV Area VTI: 1.39 cm2
AV Area mean vel: 1.22 cm2
AV Mean grad: 9.8 mmHg
AV Peak grad: 17.1 mmHg
Ao pk vel: 2.07 m/s
Area-P 1/2: 3.6 cm2
Calc EF: 62.1 %
S' Lateral: 2.1 cm
Single Plane A2C EF: 63.6 %
Single Plane A4C EF: 60.1 %

## 2021-01-07 LAB — CBC
HCT: 37.9 % — ABNORMAL LOW (ref 38.5–50.0)
Hemoglobin: 13.2 g/dL (ref 13.2–17.1)
MCH: 34.6 pg — ABNORMAL HIGH (ref 27.0–33.0)
MCHC: 34.8 g/dL (ref 32.0–36.0)
MCV: 99.5 fL (ref 80.0–100.0)
MPV: 10 fL (ref 7.5–12.5)
Platelets: 110 10*3/uL — ABNORMAL LOW (ref 140–400)
RBC: 3.81 10*6/uL — ABNORMAL LOW (ref 4.20–5.80)
RDW: 14.4 % (ref 11.0–15.0)
WBC: 5.1 10*3/uL (ref 3.8–10.8)

## 2021-01-07 LAB — MAGNESIUM: Magnesium: 1.2 mg/dL — ABNORMAL LOW (ref 1.5–2.5)

## 2021-01-07 NOTE — Progress Notes (Signed)
  Echocardiogram 2D Echocardiogram has been performed.  Juan [name] 01/07/2021, 11:57 AM

## 2021-01-08 ENCOUNTER — Other Ambulatory Visit (HOSPITAL_BASED_OUTPATIENT_CLINIC_OR_DEPARTMENT_OTHER): Payer: Self-pay

## 2021-01-08 DIAGNOSIS — M1812 Unilateral primary osteoarthritis of first carpometacarpal joint, left hand: Secondary | ICD-10-CM | POA: Diagnosis not present

## 2021-01-08 MED ORDER — HYDROCODONE-ACETAMINOPHEN 5-325 MG PO TABS
ORAL_TABLET | ORAL | 0 refills | Status: DC
  Filled 2021-01-08: qty 35, 7d supply, fill #0

## 2021-01-21 DIAGNOSIS — M79642 Pain in left hand: Secondary | ICD-10-CM | POA: Diagnosis not present

## 2021-01-21 DIAGNOSIS — Z4789 Encounter for other orthopedic aftercare: Secondary | ICD-10-CM | POA: Diagnosis not present

## 2021-01-21 DIAGNOSIS — M189 Osteoarthritis of first carpometacarpal joint, unspecified: Secondary | ICD-10-CM | POA: Diagnosis not present

## 2021-01-27 DIAGNOSIS — M79642 Pain in left hand: Secondary | ICD-10-CM | POA: Diagnosis not present

## 2021-01-29 DIAGNOSIS — Z7409 Other reduced mobility: Secondary | ICD-10-CM | POA: Diagnosis not present

## 2021-02-12 ENCOUNTER — Ambulatory Visit (INDEPENDENT_AMBULATORY_CARE_PROVIDER_SITE_OTHER): Payer: 59

## 2021-02-12 ENCOUNTER — Encounter: Payer: Self-pay | Admitting: Sports Medicine

## 2021-02-12 ENCOUNTER — Ambulatory Visit (INDEPENDENT_AMBULATORY_CARE_PROVIDER_SITE_OTHER): Payer: 59 | Admitting: Sports Medicine

## 2021-02-12 ENCOUNTER — Other Ambulatory Visit: Payer: Self-pay

## 2021-02-12 DIAGNOSIS — S93491A Sprain of other ligament of right ankle, initial encounter: Secondary | ICD-10-CM

## 2021-02-12 DIAGNOSIS — M7731 Calcaneal spur, right foot: Secondary | ICD-10-CM | POA: Diagnosis not present

## 2021-02-12 DIAGNOSIS — S93401A Sprain of unspecified ligament of right ankle, initial encounter: Secondary | ICD-10-CM | POA: Insufficient documentation

## 2021-02-12 DIAGNOSIS — S99911A Unspecified injury of right ankle, initial encounter: Secondary | ICD-10-CM | POA: Diagnosis not present

## 2021-02-12 HISTORY — DX: Sprain of unspecified ligament of right ankle, initial encounter: S93.401A

## 2021-02-12 NOTE — Assessment & Plan Note (Signed)
This is a pleasant 55 year old male, 1 week ago he took a misstep and inverted his right ankle, he had pain, swelling. On exam he has tenderness at the ATFL as well as at the medial talar dome. He also has some pain over the TMT medially. I think he has a lateral and medial sprain, ankle is stable on exam, adding an ASO, home rehab exercises, elevation, icing, x-rays. Return to see me if not better in 2 weeks.

## 2021-02-12 NOTE — Progress Notes (Signed)
    Procedures performed today:    None.  Independent interpretation of notes and tests performed by another provider:   None.  Brief History, Exam, Impression, and Recommendations:    Right ankle sprain This is a pleasant 55 year old male, 1 week ago he took a misstep and inverted his right ankle, he had pain, swelling. On exam he has tenderness at the ATFL as well as at the medial talar dome. He also has some pain over the TMT medially. I think he has a lateral and medial sprain, ankle is stable on exam, adding an ASO, home rehab exercises, elevation, icing, x-rays. Return to see me if not better in 2 weeks.    ___________________________________________ Gwen Her. Dianah Field, M.D., ABFM., CAQSM. Primary Care and [place] Instructor of [place] [facility] of Medicine

## 2021-02-18 ENCOUNTER — Other Ambulatory Visit: Payer: Self-pay | Admitting: Family Medicine

## 2021-02-18 DIAGNOSIS — R2681 Unsteadiness on feet: Secondary | ICD-10-CM

## 2021-02-18 DIAGNOSIS — T43596A Underdosing of other antipsychotics and neuroleptics, initial encounter: Secondary | ICD-10-CM | POA: Diagnosis not present

## 2021-02-18 DIAGNOSIS — G4700 Insomnia, unspecified: Secondary | ICD-10-CM | POA: Diagnosis not present

## 2021-02-18 DIAGNOSIS — G5600 Carpal tunnel syndrome, unspecified upper limb: Secondary | ICD-10-CM

## 2021-02-18 DIAGNOSIS — F1021 Alcohol dependence, in remission: Secondary | ICD-10-CM | POA: Diagnosis not present

## 2021-02-18 DIAGNOSIS — R2689 Other abnormalities of gait and mobility: Secondary | ICD-10-CM

## 2021-02-18 DIAGNOSIS — F419 Anxiety disorder, unspecified: Secondary | ICD-10-CM | POA: Diagnosis not present

## 2021-02-18 DIAGNOSIS — G729 Myopathy, unspecified: Secondary | ICD-10-CM

## 2021-02-20 ENCOUNTER — Telehealth: Payer: Self-pay | Admitting: Family Medicine

## 2021-02-20 NOTE — Telephone Encounter (Signed)
Yes.  Thank you.

## 2021-02-20 NOTE — Telephone Encounter (Signed)
Dr Zigmund Daniel    I have [facility] set up with [facility] is Start of Care date of 02/22/2021 ok?  Jenny Reichmann

## 2021-02-22 DIAGNOSIS — Z7982 Long term (current) use of aspirin: Secondary | ICD-10-CM | POA: Diagnosis not present

## 2021-02-22 DIAGNOSIS — S93491D Sprain of other ligament of right ankle, subsequent encounter: Secondary | ICD-10-CM | POA: Diagnosis not present

## 2021-02-23 DIAGNOSIS — Z7982 Long term (current) use of aspirin: Secondary | ICD-10-CM | POA: Diagnosis not present

## 2021-02-23 DIAGNOSIS — S93491D Sprain of other ligament of right ankle, subsequent encounter: Secondary | ICD-10-CM | POA: Diagnosis not present

## 2021-02-25 ENCOUNTER — Telehealth: Payer: Self-pay | Admitting: *Deleted

## 2021-02-25 NOTE — Telephone Encounter (Signed)
Agree with VO

## 2021-02-25 NOTE — Telephone Encounter (Signed)
Verbal orders given to [facility] for PT once a week for 2 weeks then twice a week for 2 weeks.  Skilled nursing verbal orders also given for once a week for 4 weeks.

## 2021-03-03 DIAGNOSIS — S93491D Sprain of other ligament of right ankle, subsequent encounter: Secondary | ICD-10-CM | POA: Diagnosis not present

## 2021-03-03 DIAGNOSIS — Z7982 Long term (current) use of aspirin: Secondary | ICD-10-CM | POA: Diagnosis not present

## 2021-03-04 DIAGNOSIS — Z7982 Long term (current) use of aspirin: Secondary | ICD-10-CM | POA: Diagnosis not present

## 2021-03-04 DIAGNOSIS — S93491D Sprain of other ligament of right ankle, subsequent encounter: Secondary | ICD-10-CM | POA: Diagnosis not present

## 2021-03-06 DIAGNOSIS — S93491D Sprain of other ligament of right ankle, subsequent encounter: Secondary | ICD-10-CM | POA: Diagnosis not present

## 2021-03-06 DIAGNOSIS — Z7982 Long term (current) use of aspirin: Secondary | ICD-10-CM | POA: Diagnosis not present

## 2021-03-10 DIAGNOSIS — Z7982 Long term (current) use of aspirin: Secondary | ICD-10-CM | POA: Diagnosis not present

## 2021-03-10 DIAGNOSIS — S93491D Sprain of other ligament of right ankle, subsequent encounter: Secondary | ICD-10-CM | POA: Diagnosis not present

## 2021-03-12 ENCOUNTER — Ambulatory Visit: Payer: 59 | Admitting: Family Medicine

## 2021-03-12 ENCOUNTER — Telehealth: Payer: Self-pay

## 2021-03-12 DIAGNOSIS — S93491D Sprain of other ligament of right ankle, subsequent encounter: Secondary | ICD-10-CM | POA: Diagnosis not present

## 2021-03-12 DIAGNOSIS — Z7982 Long term (current) use of aspirin: Secondary | ICD-10-CM | POA: Diagnosis not present

## 2021-03-12 NOTE — Telephone Encounter (Signed)
Verbal orders given to [facility] [phone]).   Occupational Therapy: Weekly x 4 weeks Per Dr. Zigmund Daniel.

## 2021-03-13 DIAGNOSIS — Z7982 Long term (current) use of aspirin: Secondary | ICD-10-CM | POA: Diagnosis not present

## 2021-03-13 DIAGNOSIS — S93491D Sprain of other ligament of right ankle, subsequent encounter: Secondary | ICD-10-CM | POA: Diagnosis not present

## 2021-03-17 DIAGNOSIS — Z7982 Long term (current) use of aspirin: Secondary | ICD-10-CM | POA: Diagnosis not present

## 2021-03-17 DIAGNOSIS — S93491D Sprain of other ligament of right ankle, subsequent encounter: Secondary | ICD-10-CM | POA: Diagnosis not present

## 2021-03-18 DIAGNOSIS — Z7982 Long term (current) use of aspirin: Secondary | ICD-10-CM | POA: Diagnosis not present

## 2021-03-18 DIAGNOSIS — S93491D Sprain of other ligament of right ankle, subsequent encounter: Secondary | ICD-10-CM | POA: Diagnosis not present

## 2021-03-19 DIAGNOSIS — Z7982 Long term (current) use of aspirin: Secondary | ICD-10-CM | POA: Diagnosis not present

## 2021-03-19 DIAGNOSIS — S93491D Sprain of other ligament of right ankle, subsequent encounter: Secondary | ICD-10-CM | POA: Diagnosis not present

## 2021-03-23 ENCOUNTER — Encounter: Payer: Self-pay | Admitting: Family Medicine

## 2021-03-23 ENCOUNTER — Other Ambulatory Visit: Payer: Self-pay

## 2021-03-23 ENCOUNTER — Ambulatory Visit (INDEPENDENT_AMBULATORY_CARE_PROVIDER_SITE_OTHER): Payer: 59 | Admitting: Family Medicine

## 2021-03-23 VITALS — BP 128/55 | HR 82 | Ht 65.0 in | Wt 176.0 lb

## 2021-03-23 DIAGNOSIS — Z639 Problem related to primary support group, unspecified: Secondary | ICD-10-CM | POA: Insufficient documentation

## 2021-03-23 DIAGNOSIS — R350 Frequency of micturition: Secondary | ICD-10-CM

## 2021-03-23 DIAGNOSIS — R42 Dizziness and giddiness: Secondary | ICD-10-CM | POA: Insufficient documentation

## 2021-03-23 DIAGNOSIS — R351 Nocturia: Secondary | ICD-10-CM | POA: Diagnosis not present

## 2021-03-23 HISTORY — DX: Frequency of micturition: R35.0

## 2021-03-23 HISTORY — DX: Dizziness and giddiness: R42

## 2021-03-23 LAB — POCT URINALYSIS DIP (CLINITEK)
Bilirubin, UA: NEGATIVE
Blood, UA: NEGATIVE
Glucose, UA: NEGATIVE mg/dL
Ketones, POC UA: NEGATIVE mg/dL
Leukocytes, UA: NEGATIVE
Nitrite, UA: NEGATIVE
POC PROTEIN,UA: NEGATIVE
Spec Grav, UA: 1.015 (ref 1.010–1.025)
Urobilinogen, UA: 2 E.U./dL — AB
pH, UA: 7.5 (ref 5.0–8.0)

## 2021-03-23 NOTE — Progress Notes (Signed)
Juan [name] - 55 y.o. male MRN [id number]  Date of birth: [birth year]  Subjective Chief Complaint  Patient presents with   Nocturia   balance    HPI Juan [name] is a 55 year old male here today with complaint of urinary frequency.  He notices this more often at night.  This has gotten a little better since initial onset.  At initial onset he was going as many as 8 times in the evening.  He does report drinking a lot of fluids during the day which may be contributing to his urinary frequency.  He has noted improvement since cutting back on this some.  He denies any pain with urination, difficulty starting his stream, blood in his urine.  His urinalysis today is unremarkable.  ROS:  A comprehensive ROS was completed and negative except as noted per HPI  Allergies  Allergen Reactions   Charentais Melon (French Melon) Anaphylaxis   Cucumber Extract Itching and Nausea And Vomiting    No extracts; just cucumber    Other Anaphylaxis   Peanut Butter Flavor Anaphylaxis and Swelling   Peanut Oil Swelling   Shellfish Allergy Itching and Swelling   Cantaloupe Extract Allergy Skin Test Rash   Lactose Other (See Comments)   Strawberry Extract Nausea And Vomiting and Swelling   Vancomycin Rash   Codeine     itching   Depakote Er [Divalproex Sodium Er]     Tongue swelling    Past Medical History:  Diagnosis Date   Alcohol addiction ([facility])    Anxiety    Chronic fatigue    Colon polyps    Depression    GERD (gastroesophageal reflux disease)    Hemochromatosis    Hx of blood clots    Leg   Hyperreflexia    Hypertension    Hypertension    PTSD (post-traumatic stress disorder)    Traumatic hemorrhagic shock ([facility])    Ulcerative colitis ([facility])     Past Surgical History:  Procedure Laterality Date   CARPAL TUNNEL RELEASE Bilateral    FRACTURE SURGERY     left ankle plate   HERNIA REPAIR     inguinal   KNEE SURGERY Right    x 4   SHOULDER SURGERY Bilateral    x 2   VASECTOMY       Social History   Socioeconomic History   Marital status: Married    Spouse name: [name]   Number of children: 2   Years of education: Not on file   Highest education level: High school graduate  Occupational History    Comment: disability  Tobacco Use   Smoking status: Never   Smokeless tobacco: Current    Types: Snuff  Vaping Use   Vaping Use: Never used  Substance and Sexual Activity   Alcohol use: Not Currently   Drug use: Never   Sexual activity: Not on file  Other Topics Concern   Not on file  Social History Narrative   Lives with wife   Social Determinants of Health   Financial Resource Strain: Not on file  Food Insecurity: Not on file  Transportation Needs: Not on file  Physical Activity: Not on file  Stress: Not on file  Social Connections: Not on file    Family History  Problem Relation Age of Onset   Pulmonary fibrosis Mother    Other Father        liver failure   Breast cancer Paternal Aunt     Health Maintenance  Topic Date Due  COVID-19 Vaccine (4 - Booster for Pfizer series) 03/05/2021   INFLUENZA VACCINE  03/23/2021   Pneumococcal Vaccine 38-30 Years old (1 - PCV) 02/12/2022 (Originally 10/03/1971)   TETANUS/TDAP  06/20/2029   COLONOSCOPY (Pts 45-27yr Insurance coverage will need to be confirmed)  10/14/2029   Hepatitis C Screening  Completed   HIV Screening  Completed   Zoster Vaccines- Shingrix  Completed   HPV VACCINES  Aged Out     ----------------------------------------------------------------------------------------------------------------------------------------------------------------------------------------------------------------- Physical Exam BP (!) 128/55 (BP Location: Left Arm, Patient Position: Sitting, Cuff Size: Normal)   Pulse 82   Ht 5' 5"  (1.651 m)   Wt 176 lb (79.8 kg)   SpO2 96%   BMI 29.29 kg/m   Physical Exam Constitutional:      Appearance: Normal appearance.  Eyes:     General: No scleral  icterus. Cardiovascular:     Rate and Rhythm: Normal rate and regular rhythm.  Pulmonary:     Effort: Pulmonary effort is normal.     Breath sounds: Normal breath sounds.  Musculoskeletal:     Cervical back: Neck supple.  Skin:    General: Skin is warm and dry.  Neurological:     General: No focal deficit present.     Mental Status: He is alert.  Psychiatric:        Mood and Affect: Mood normal.        Behavior: Behavior normal.    ------------------------------------------------------------------------------------------------------------------------------------------------------------------------------------------------------------------- Assessment and Plan  Dizziness Referral to ENT to assess for any vestibular problems which may be contributing to his continued dizziness and balance issues.  Urinary frequency Urinalysis is unremarkable.  Checking PSA level today.  He has had some improvement since cutting back on fluid intake.   No orders of the defined types were placed in this encounter.   No follow-ups on file.    This visit occurred during the SARS-CoV-2 public health emergency.  Safety protocols were in place, including screening questions prior to the visit, additional usage of staff PPE, and extensive cleaning of exam room while observing appropriate contact time as indicated for disinfecting solutions.

## 2021-03-23 NOTE — Assessment & Plan Note (Signed)
Referral to ENT to assess for any vestibular problems which may be contributing to his continued dizziness and balance issues.

## 2021-03-23 NOTE — Assessment & Plan Note (Signed)
Urinalysis is unremarkable.  Checking PSA level today.  He has had some improvement since cutting back on fluid intake.

## 2021-03-24 DIAGNOSIS — S93491D Sprain of other ligament of right ankle, subsequent encounter: Secondary | ICD-10-CM | POA: Diagnosis not present

## 2021-03-24 DIAGNOSIS — Z7982 Long term (current) use of aspirin: Secondary | ICD-10-CM | POA: Diagnosis not present

## 2021-03-24 LAB — PSA: PSA: 0.63 ng/mL (ref <=4.00)

## 2021-03-25 DIAGNOSIS — G4733 Obstructive sleep apnea (adult) (pediatric): Secondary | ICD-10-CM | POA: Diagnosis not present

## 2021-03-25 DIAGNOSIS — F419 Anxiety disorder, unspecified: Secondary | ICD-10-CM | POA: Diagnosis not present

## 2021-03-25 DIAGNOSIS — F1021 Alcohol dependence, in remission: Secondary | ICD-10-CM | POA: Diagnosis not present

## 2021-03-25 DIAGNOSIS — G4700 Insomnia, unspecified: Secondary | ICD-10-CM | POA: Diagnosis not present

## 2021-03-25 DIAGNOSIS — R2689 Other abnormalities of gait and mobility: Secondary | ICD-10-CM | POA: Diagnosis not present

## 2021-03-25 DIAGNOSIS — G7100 Muscular dystrophy, unspecified: Secondary | ICD-10-CM | POA: Diagnosis not present

## 2021-03-25 LAB — URINE CULTURE
MICRO NUMBER:: 12188401
Result:: NO GROWTH
SPECIMEN QUALITY:: ADEQUATE

## 2021-03-26 ENCOUNTER — Telehealth: Payer: Self-pay

## 2021-03-26 NOTE — Telephone Encounter (Signed)
PT Kayla lvm stating Juan [name] fell on 03/25/21 at the [facility]. [facility] not have [facility].

## 2021-03-27 DIAGNOSIS — Z4789 Encounter for other orthopedic aftercare: Secondary | ICD-10-CM | POA: Diagnosis not present

## 2021-03-27 DIAGNOSIS — M189 Osteoarthritis of first carpometacarpal joint, unspecified: Secondary | ICD-10-CM | POA: Diagnosis not present

## 2021-03-27 DIAGNOSIS — M1812 Unilateral primary osteoarthritis of first carpometacarpal joint, left hand: Secondary | ICD-10-CM | POA: Diagnosis not present

## 2021-03-27 DIAGNOSIS — M79642 Pain in left hand: Secondary | ICD-10-CM | POA: Diagnosis not present

## 2021-03-27 NOTE — Telephone Encounter (Signed)
Have him f/u if pain is not improving.   Thanks!

## 2021-03-31 NOTE — Telephone Encounter (Signed)
Spouse is aware

## 2021-04-02 DIAGNOSIS — S93491D Sprain of other ligament of right ankle, subsequent encounter: Secondary | ICD-10-CM | POA: Diagnosis not present

## 2021-04-02 DIAGNOSIS — Z7982 Long term (current) use of aspirin: Secondary | ICD-10-CM | POA: Diagnosis not present

## 2021-04-06 ENCOUNTER — Other Ambulatory Visit: Payer: Self-pay

## 2021-04-06 ENCOUNTER — Encounter (HOSPITAL_BASED_OUTPATIENT_CLINIC_OR_DEPARTMENT_OTHER): Payer: Self-pay | Admitting: Emergency Medicine

## 2021-04-06 ENCOUNTER — Emergency Department (HOSPITAL_BASED_OUTPATIENT_CLINIC_OR_DEPARTMENT_OTHER)
Admission: EM | Admit: 2021-04-06 | Discharge: 2021-04-07 | Disposition: A | Discharge status: Home / Self Care | Payer: 59 | Attending: Emergency Medicine | Admitting: Emergency Medicine

## 2021-04-06 DIAGNOSIS — Z7982 Long term (current) use of aspirin: Secondary | ICD-10-CM | POA: Diagnosis not present

## 2021-04-06 DIAGNOSIS — Z79899 Other long term (current) drug therapy: Secondary | ICD-10-CM | POA: Diagnosis not present

## 2021-04-06 DIAGNOSIS — R1310 Dysphagia, unspecified: Secondary | ICD-10-CM | POA: Diagnosis not present

## 2021-04-06 DIAGNOSIS — I10 Essential (primary) hypertension: Secondary | ICD-10-CM | POA: Diagnosis not present

## 2021-04-06 DIAGNOSIS — R609 Edema, unspecified: Secondary | ICD-10-CM | POA: Diagnosis not present

## 2021-04-06 DIAGNOSIS — Z8616 Personal history of COVID-19: Secondary | ICD-10-CM | POA: Insufficient documentation

## 2021-04-06 DIAGNOSIS — F17290 Nicotine dependence, other tobacco product, uncomplicated: Secondary | ICD-10-CM | POA: Diagnosis not present

## 2021-04-06 DIAGNOSIS — T7840XA Allergy, unspecified, initial encounter: Secondary | ICD-10-CM | POA: Diagnosis not present

## 2021-04-06 DIAGNOSIS — S93491D Sprain of other ligament of right ankle, subsequent encounter: Secondary | ICD-10-CM | POA: Diagnosis not present

## 2021-04-06 DIAGNOSIS — Z91010 Allergy to peanuts: Secondary | ICD-10-CM | POA: Diagnosis not present

## 2021-04-06 LAB — COMPREHENSIVE METABOLIC PANEL
ALT: 22 U/L (ref 0–44)
AST: 91 U/L — ABNORMAL HIGH (ref 15–41)
Albumin: 3.7 g/dL (ref 3.5–5.0)
Alkaline Phosphatase: 82 U/L (ref 38–126)
Anion gap: 16 — ABNORMAL HIGH (ref 5–15)
BUN: 9 mg/dL (ref 6–20)
CO2: 23 mmol/L (ref 22–32)
Calcium: 9.8 mg/dL (ref 8.9–10.3)
Chloride: 102 mmol/L (ref 98–111)
Creatinine, Ser: 1 mg/dL (ref 0.61–1.24)
GFR, Estimated: >60 mL/min (ref >60)
Glucose, Bld: 113 mg/dL — ABNORMAL HIGH (ref 70–99)
Potassium: 3.1 mmol/L — ABNORMAL LOW (ref 3.5–5.1)
Sodium: 141 mmol/L (ref 135–145)
Total Bilirubin: 2.2 mg/dL — ABNORMAL HIGH (ref 0.3–1.2)
Total Protein: 7.7 g/dL (ref 6.5–8.1)

## 2021-04-06 LAB — CBC
HCT: 36.9 % — ABNORMAL LOW (ref 39.0–52.0)
Hemoglobin: 13.1 g/dL (ref 13.0–17.0)
MCH: 33.9 pg (ref 26.0–34.0)
MCHC: 35.5 g/dL (ref 30.0–36.0)
MCV: 95.3 fL (ref 80.0–100.0)
Platelets: 45 10*3/uL — ABNORMAL LOW (ref 150–400)
RBC: 3.87 MIL/uL — ABNORMAL LOW (ref 4.22–5.81)
RDW: 15 % (ref 11.5–15.5)
WBC: 4.9 10*3/uL (ref 4.0–10.5)
nRBC: 0 % (ref 0.0–0.2)

## 2021-04-06 MED ORDER — FAMOTIDINE IN NACL 20-0.9 MG/50ML-% IV SOLN
20.0000 mg | Freq: Once | INTRAVENOUS | Status: AC
  Administered 2021-04-06: 20 mg via INTRAVENOUS
  Filled 2021-04-06: qty 50

## 2021-04-06 MED ORDER — SODIUM CHLORIDE 0.9 % IV SOLN: INTRAVENOUS | Status: DC

## 2021-04-06 MED ORDER — METHYLPREDNISOLONE SODIUM SUCC 125 MG IJ SOLR
125.0000 mg | Freq: Once | INTRAMUSCULAR | Status: AC
  Administered 2021-04-06: 125 mg via INTRAVENOUS
  Filled 2021-04-06: qty 2

## 2021-04-06 MED ORDER — FAMOTIDINE 20 MG PO TABS: 20.0000 mg | ORAL_TABLET | Freq: Two times a day (BID) | ORAL | 0 refills | Status: DC

## 2021-04-06 MED ORDER — ONDANSETRON HCL 4 MG/2ML IJ SOLN
4.0000 mg | Freq: Once | INTRAMUSCULAR | Status: AC
  Administered 2021-04-06: 4 mg via INTRAVENOUS
  Filled 2021-04-06: qty 2

## 2021-04-06 MED ORDER — PREDNISONE 10 MG PO TABS: 40.0000 mg | ORAL_TABLET | Freq: Every day | ORAL | 0 refills | Status: DC

## 2021-04-06 NOTE — Discharge Instructions (Addendum)
Hold the lisinopril.  Take Benadryl 25 mg every 6 hours at least for the next 24 hours.  Take Pepcid twice a day for the next 7 days.  And take the prednisone as directed for the next 5 days.  If you get worse at home use your EpiPen.  And get seen.

## 2021-04-06 NOTE — ED Triage Notes (Signed)
About 1 hour ago  started to have swelling  rt side  of face , difficulty swallowing and hoarse voice  raspy voice, he took  his own epi pen and 5o of benadryle po prior to [place]  [facility] arrival, then was given another epi pen per ems, pt handling secretions well right now 96 % O2 sat RA

## 2021-04-06 NOTE — ED Provider Notes (Signed)
[facility] Provider Note   CSN: [id number] Arrival date & time: 04/06/21  1804     History Chief Complaint  Patient presents with   Allergic Reaction    Juan [name] is a 55 y.o. male.  Patient brought in by [facility].  At approximately 1715 patient started with an allergic reaction with swelling to the right side of the face difficulty swallowing voice being hoarse.  Patient took his own EpiPen and 50 mg of Benadryl prior to [facility] arrival.  EMS gave him another EpiPen.  Patient handling secretions well right now oxygen saturations 96% on room air.  Patient is not exactly sure what caused the reaction.  He has lots of food type allergies.  Patient able to talk okay currently.  Patient denies any tongue swelling.  Or any wheezing.  Patient denies any hives.  Past medical history significant for alcohol addiction chronic fatigue posttraumatic stress disorder.  Hypertension gastroesophageal reflux disease and ulcerative colitis.      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   Well adult exam 22/97/9892   Systolic murmur 11/94/1740   Hypocalcemia 11/02/2020   Cytopenia 11/02/2020   COVID-19 virus infection 08/13/2020   COVID-19 08/07/2020   Primary osteoarthritis of first carpometacarpal joint of one hand, left 07/11/2020   Allergic reaction 07/08/2020   History of alcohol abuse  05/20/2020   Impingement syndrome, shoulder, right 05/20/2020   Fracture of laryngeal cartilage ([place]) 01/17/2020   Esophagitis, [place] grade D 12/05/2019   Chronic alcoholic liver disease ([place]) 09/30/2019   GAD (generalized anxiety disorder) 04/16/2019   Chronic post-traumatic stress disorder (PTSD) 04/16/2019   MDD (major depressive disorder), recurrent severe, 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   History of bilateral inguinal hernia repair 01/19/2019   Myopathy 11/18/2018   Laceration of extensor hallucis longus tendon, left, initial encounter 02/06/2017   Elevated liver enzymes 01/07/2017   Chronic fatigue 12/21/2016   Benign essential hypertension 12/21/2016    Past Surgical History:  Procedure Laterality Date   CARPAL TUNNEL RELEASE Bilateral    FRACTURE SURGERY     left ankle plate   HERNIA REPAIR     inguinal   KNEE SURGERY Right    x 4   SHOULDER SURGERY Bilateral    x 2   VASECTOMY         Family History  Problem Relation Age of Onset   Pulmonary fibrosis Mother    Other Father        liver failure   Breast cancer Paternal Aunt     Social History   Tobacco Use   Smoking status: Never   Smokeless tobacco: Current    Types: Snuff  Vaping Use   Vaping Use: Never used  Substance Use Topics   Alcohol use: Not Currently   Drug  use: Never    Home Medications Prior to Admission medications   Medication Sig Start Date End Date Taking? Authorizing Provider  aspirin 81 MG chewable tablet Chew 81 mg by mouth daily.    [provider]  B Complex Vitamins (B COMPLEX PO) Take by mouth daily.    [provider]  busPIRone (BUSPAR) 10 MG tablet TAKE 2 TABLETS BY MOUTH THREE TIMES DAILY 07/10/20 07/10/21  [name], DO  Cholecalciferol 50 MCG (2000 UT) CAPS Take 1 capsule by mouth daily.    [provider]  EPINEPHrine 0.3 mg/0.3 mL IJ SOAJ injection INJECT 0.3 MG INTO THE MUSCLE AS NEEDED FOR ANAPHYLAXIS. 07/08/20 07/08/21   [name], DO  escitalopram (LEXAPRO) 20 MG tablet Take 20 mg by mouth daily. 03/07/20   [provider]  folic acid (FOLVITE) 1 MG tablet TAKE 1 TABLET (1 MG TOTAL) BY MOUTH DAILY. 05/29/20 05/29/21  [name], DO  HYDROcodone-acetaminophen (NORCO/VICODIN) 5-325 MG tablet Take 1-2 tablets by mouth every 4-6 hours as needed for pain for 5 days. NOT TO EXCEED 10 TABLETS A DAY. 01/08/21     lisinopril (ZESTRIL) 10 MG tablet TAKE 1 TABLET (10 MG TOTAL) BY MOUTH DAILY. 05/29/20 05/29/21  [name], DO  Magnesium Oxide 400 (240 Mg) MG TABS Take 240 mg by mouth.    [provider]  mirtazapine (REMERON) 15 MG tablet Take 15 mg by mouth at bedtime. 03/07/20   [provider]  Multiple Vitamins-Minerals (YOUR LIFE MULTI ADULT GUMMIES) CHEW Chew 1 tablet by mouth daily.    [provider]  pantoprazole (PROTONIX) 40 MG tablet TAKE 1 TABLET (40 MG TOTAL) BY MOUTH DAILY. 11/13/20 11/13/21  [name], DO  saccharomyces boulardii (FLORASTOR) 250 MG capsule Take 250 mg by mouth 2 (two) times daily.     [provider]  thiamine 100 MG tablet Take 100 mg by mouth daily. 11/14/19   [provider]    Allergies    Charentais melon (french melon), Cucumber extract, Other, Peanut butter flavor, Peanut oil, Shellfish allergy, Cantaloupe extract allergy skin test, Lactose, Strawberry extract, Vancomycin, Codeine, and Depakote er [divalproex sodium er]  Review of Systems   Review of Systems  Constitutional:  Negative for chills and fever.  HENT:  Positive for facial swelling, trouble swallowing and voice change. Negative for ear pain and sore throat.   Eyes:  Negative for pain and visual disturbance.  Respiratory:  Negative for cough and shortness of breath.   Cardiovascular:  Negative for chest pain and palpitations.  Gastrointestinal:  Negative for abdominal pain and vomiting.  Genitourinary:  Negative for dysuria and hematuria.  Musculoskeletal:   Negative for arthralgias and back pain.  Skin:  Negative for color change and rash.  Neurological:  Negative for seizures and syncope.  All other systems reviewed and are negative.  Physical Exam Updated Vital Signs BP (!) 155/87 (BP Location: Right Arm)   Pulse 89   Temp 99 F (37.2 C) (Oral)   Resp 19   Ht 1.6 m (5' 3" )   Wt 78.5 kg   SpO2 96%   BMI 30.65 kg/m   Physical Exam Vitals and nursing note reviewed.  Constitutional:      General: He is not in acute distress.    Appearance: He is well-developed.  HENT:     Head: Normocephalic and atraumatic.     Comments: Swelling to the right side of the face.  None to the left  Mouth/Throat:     Mouth: Mucous membranes are moist.     Pharynx: Oropharynx is clear.     Comments: Tongue is not swollen.  Posterior pharynx appears normal Eyes:     Extraocular Movements: Extraocular movements intact.     Conjunctiva/sclera: Conjunctivae normal.     Pupils: Pupils are equal, round, and reactive to light.  Cardiovascular:     Rate and Rhythm: Normal rate and regular rhythm.     Heart sounds: Murmur heard.  Pulmonary:     Effort: Pulmonary effort is normal. No respiratory distress.     Breath sounds: Normal breath sounds. No stridor. No wheezing, rhonchi or rales.  Chest:     Chest wall: No tenderness.  Abdominal:     Palpations: Abdomen is soft.     Tenderness: There is no abdominal tenderness.  Musculoskeletal:        General: Normal range of motion.     Cervical back: Normal range of motion and neck supple. No rigidity.  Skin:    General: Skin is warm and dry.     Findings: No rash.  Neurological:     General: No focal deficit present.     Mental Status: He is alert and oriented to person, place, and time.    ED Results / Procedures / Treatments   Labs (all labs ordered are listed, but only abnormal results are displayed) Labs Reviewed  COMPREHENSIVE METABOLIC PANEL - Abnormal; Notable for the following components:       Result Value   Potassium 3.1 (*)    Glucose, Bld 113 (*)    AST 91 (*)    Total Bilirubin 2.2 (*)    Anion gap 16 (*)    All other components within normal limits  CBC    EKG None  Radiology No results found.  Procedures Procedures   Medications Ordered in ED Medications  0.9 %  sodium chloride infusion ( Intravenous New Bag/Given 04/06/21 1850)  famotidine (PEPCID) IVPB 20 mg premix (20 mg Intravenous New Bag/Given 04/06/21 1853)  methylPREDNISolone sodium succinate (SOLU-MEDROL) 125 mg/2 mL injection 125 mg (125 mg Intravenous Given 04/06/21 1854)    ED Course  I have reviewed the triage vital signs and the nursing notes.  Pertinent labs & imaging results that were available during my care of the patient were reviewed by me and considered in my medical decision making (see chart for details).    MDM Rules/Calculators/A&P                         CRITICAL CARE Performed by: Fredia Sorrow Total critical care time: 45 minutes Critical care time was exclusive of separately billable procedures and treating other patients. Critical care was necessary to treat or prevent imminent or life-threatening deterioration. Critical care was time spent personally by me on the following activities: development of treatment plan with patient and/or surrogate as well as nursing, discussions with consultants, evaluation of patient's response to treatment, examination of patient, obtaining history from patient or surrogate, ordering and performing treatments and interventions, ordering and review of laboratory studies, ordering and review of radiographic studies, pulse oximetry and re-evaluation of patient's condition.  Patient's lungs are clear.  Patient able to talk in sentences right side of face is swollen.  Patient still feels as if he is got some swelling in the neck area inside.  Will treat with Solu-Medrol and Pepcid.  Patient is already had 50 mg of Benadryl.  Is already had EpiPen  x2.  We will do cardiac monitoring get basic labs.  And observe.  Patient now feeling better.  Facial swelling going down.  Still has some hoarseness.  No wheezing.  Oxygen saturations are fine.  Patient is also on on lisinopril.  So it is possible that this could be related to that.  Since there is lip swelling on the right side facial swelling.  No tongue swelling.  But also patient has lots of known food allergies.  So is hard to say what may have triggered it.  Patient is improving.  Patient will be discharged home on Benadryl every 6 hours Pepcid for the next 7 days and prednisone for the next 5 days.  Patient does have an EpiPen at home.  Final Clinical Impression(s) / ED Diagnoses Final diagnoses:  Allergic reaction, initial encounter    Rx / DC Orders ED Discharge Orders     None        Fredia Sorrow, MD 04/06/21 2352

## 2021-04-08 ENCOUNTER — Other Ambulatory Visit: Payer: Self-pay

## 2021-04-08 ENCOUNTER — Other Ambulatory Visit (HOSPITAL_BASED_OUTPATIENT_CLINIC_OR_DEPARTMENT_OTHER): Payer: Self-pay

## 2021-04-08 ENCOUNTER — Ambulatory Visit: Payer: 59 | Admitting: Family Medicine

## 2021-04-08 ENCOUNTER — Encounter: Payer: Self-pay | Admitting: Family Medicine

## 2021-04-08 VITALS — BP 106/65 | HR 78 | Ht 65.0 in | Wt 168.0 lb

## 2021-04-08 DIAGNOSIS — I10 Essential (primary) hypertension: Secondary | ICD-10-CM

## 2021-04-08 DIAGNOSIS — D649 Anemia, unspecified: Secondary | ICD-10-CM | POA: Diagnosis not present

## 2021-04-08 DIAGNOSIS — K208 Other esophagitis without bleeding: Secondary | ICD-10-CM

## 2021-04-08 DIAGNOSIS — D696 Thrombocytopenia, unspecified: Secondary | ICD-10-CM

## 2021-04-08 DIAGNOSIS — K709 Alcoholic liver disease, unspecified: Secondary | ICD-10-CM

## 2021-04-08 DIAGNOSIS — H9311 Tinnitus, right ear: Secondary | ICD-10-CM

## 2021-04-08 DIAGNOSIS — E876 Hypokalemia: Secondary | ICD-10-CM

## 2021-04-08 DIAGNOSIS — R7309 Other abnormal glucose: Secondary | ICD-10-CM

## 2021-04-08 DIAGNOSIS — T7840XA Allergy, unspecified, initial encounter: Secondary | ICD-10-CM

## 2021-04-08 LAB — POCT GLYCOSYLATED HEMOGLOBIN (HGB A1C): Hemoglobin A1C: 5.1 % (ref 4.0–5.6)

## 2021-04-08 MED ORDER — EPINEPHRINE 0.3 MG/0.3ML IJ SOAJ
INTRAMUSCULAR | 0 refills | Status: DC
  Filled 2021-04-08: qty 2, 2d supply, fill #0

## 2021-04-08 NOTE — Assessment & Plan Note (Signed)
Due to get back in with GI. Has ben 18 months and platelets are low gain.

## 2021-04-08 NOTE — Progress Notes (Addendum)
Acute Office Visit  Subjective:    Patient ID: Jentry Warnell, male    DOB: 04/26/1966, 55 y.o.   MRN: [id number]  Chief Complaint  Patient presents with   Follow-up    Allergic reaction     HPI Patient is in today for follow-up from emergency department meant to visit for an allergic reaction.  Nesanel is a 55 year old male who normally follows with Dr. Zigmund Daniel, who has a history of hypertension, chronic alcoholic liver disease who on 815 started having some swelling on the right side of his face and noticed difficulty swallowing and his voice was hoarse.  He had had apple juice earlier that day and started vomiting multiple times before the swelling started.  Vomiting started probably around 9 AM and then the facial swelling started around 5 PM so about 8 hours later.  EMS was called.  He had an EpiPen which she did administer as well as 50 mg of Benadryl prior to arrival of [facility].  EMS gave him a second dose of the EpiPen.  He has a history of multiple food allergies and is not clear exactly what may have caused his symptoms.  In emergency department labs revealed a low potassium level as well as elevated AST.  He was given IV fluids, H2 blocker and methylprednisolone 125 mg.  He was also on an ACE inhibitor at the time and was noted to have some right-sided lip swelling it was unclear whether or not this may have been a potential trigger so it was discontinued.  He has been taking Benadryl every 6 hours as well as Pepcid daily since being home.  And he does report having an extra EpiPen at home.  Since home she is better until last night where a Gatorade with all her diet, which she drinks frequently, and started to swell again.  Also c/o of right sided tinnitus.  Would like ENT referral.   ED notes and labs reviewed.  Review of systems: More easy bruising recently, lower extremity foot swelling for months.  Past Medical History:  Diagnosis Date   Alcohol addiction  ([place])    Anxiety    Chronic fatigue    Colon polyps    Depression    GERD (gastroesophageal reflux disease)    Hemochromatosis    Hx of blood clots    Leg   Hyperreflexia    Hypertension    Hypertension    PTSD (post-traumatic stress disorder)    Traumatic hemorrhagic shock (HCC)    Ulcerative colitis ([place])     Past Surgical History:  Procedure Laterality Date   CARPAL TUNNEL RELEASE Bilateral    FRACTURE SURGERY     left ankle plate   HERNIA REPAIR     inguinal   KNEE SURGERY Right    x 4   SHOULDER SURGERY Bilateral    x 2   VASECTOMY      Family History  Problem Relation Age of Onset   Pulmonary fibrosis Mother    Other Father        liver failure   Breast cancer Paternal Aunt     Social History   Socioeconomic History   Marital status: Married    Spouse name: [name]   Number of children: 2   Years of education: Not on file   Highest education level: High school graduate  Occupational History    Comment: disability  Tobacco Use   Smoking status: Never   Smokeless tobacco: Current  Types: Snuff  Vaping Use   Vaping Use: Never used  Substance and Sexual Activity   Alcohol use: Not Currently   Drug use: Never   Sexual activity: Not on file  Other Topics Concern   Not on file  Social History Narrative   Lives with wife   Social Determinants of Health   Financial Resource Strain: Not on file  Food Insecurity: Not on file  Transportation Needs: Not on file  Physical Activity: Not on file  Stress: Not on file  Social Connections: Not on file  Intimate Partner Violence: Not on file    Outpatient Medications Prior to Visit  Medication Sig Dispense Refill   aspirin 81 MG chewable tablet Chew 81 mg by mouth daily.     B Complex Vitamins (B COMPLEX PO) Take by mouth daily.     busPIRone (BUSPAR) 10 MG tablet TAKE 2 TABLETS BY MOUTH THREE TIMES DAILY 540 tablet 1   Cholecalciferol 50 MCG (2000 UT) CAPS Take 1 capsule by mouth daily.      escitalopram (LEXAPRO) 20 MG tablet Take 20 mg by mouth daily.     famotidine (PEPCID) 20 MG tablet Take 1 tablet (20 mg total) by mouth 2 (two) times daily. 30 tablet 0   folic acid (FOLVITE) 1 MG tablet TAKE 1 TABLET (1 MG TOTAL) BY MOUTH DAILY. 90 tablet 1   HYDROcodone-acetaminophen (NORCO/VICODIN) 5-325 MG tablet Take 1-2 tablets by mouth every 4-6 hours as needed for pain for 5 days. NOT TO EXCEED 10 TABLETS A DAY. 35 tablet 0   lisinopril (ZESTRIL) 10 MG tablet TAKE 1 TABLET (10 MG TOTAL) BY MOUTH DAILY. 90 tablet 3   Magnesium Oxide 400 (240 Mg) MG TABS Take 240 mg by mouth.     mirtazapine (REMERON) 15 MG tablet Take 15 mg by mouth at bedtime.     Multiple Vitamins-Minerals (YOUR LIFE MULTI ADULT GUMMIES) CHEW Chew 1 tablet by mouth daily.     pantoprazole (PROTONIX) 40 MG tablet TAKE 1 TABLET (40 MG TOTAL) BY MOUTH DAILY. 90 tablet 3   predniSONE (DELTASONE) 10 MG tablet Take 4 tablets (40 mg total) by mouth daily. 20 tablet 0   saccharomyces boulardii (FLORASTOR) 250 MG capsule Take 250 mg by mouth 2 (two) times daily.      thiamine 100 MG tablet Take 100 mg by mouth daily.     EPINEPHrine 0.3 mg/0.3 mL IJ SOAJ injection INJECT 0.3 MG INTO THE MUSCLE AS NEEDED FOR ANAPHYLAXIS. 2 each 0   No facility-administered medications prior to visit.    Allergies  Allergen Reactions   Charentais Melon (French Melon) Anaphylaxis   Cucumber Extract Itching and Nausea And Vomiting    No extracts; just cucumber    Other Anaphylaxis   Peanut Butter Flavor Anaphylaxis and Swelling   Peanut Oil Swelling   Shellfish Allergy Itching and Swelling   Cantaloupe Extract Allergy Skin Test Rash   Lactose Other (See Comments)   Strawberry Extract Nausea And Vomiting and Swelling   Vancomycin Rash   Codeine     itching   Depakote Er [Divalproex Sodium Er]     Tongue swelling    Review of Systems     Objective:    Physical Exam Constitutional:      Appearance: He is well-developed.  HENT:      Head: Normocephalic and atraumatic.     Right Ear: Tympanic membrane, ear canal and external ear normal.  Cardiovascular:     Rate  and Rhythm: Normal rate and regular rhythm.     Heart sounds: Murmur heard.     Comments: 2/6 SEM Pulmonary:     Effort: Pulmonary effort is normal.     Breath sounds: Normal breath sounds.  Skin:    General: Skin is warm and dry.  Neurological:     Mental Status: He is alert and oriented to person, place, and time.  Psychiatric:        Behavior: Behavior normal.    BP 106/65   Pulse 78   Ht 5' 5"  (1.651 m)   Wt 168 lb 0.6 oz (76.2 kg)   SpO2 98%   BMI 27.96 kg/m  Wt Readings from Last 3 Encounters:  04/08/21 168 lb 0.6 oz (76.2 kg)  04/06/21 173 lb (78.5 kg)  03/23/21 176 lb (79.8 kg)    Health Maintenance Due  Topic Date Due   COVID-19 Vaccine (4 - Booster for Pfizer series) 03/05/2021   INFLUENZA VACCINE  03/23/2021    There are no preventive care reminders to display for this patient.   No results found for: TSH Lab Results  Component Value Date   WBC 4.9 04/06/2021   HGB 13.1 04/06/2021   HCT 36.9 (L) 04/06/2021   MCV 95.3 04/06/2021   PLT 45 (L) 04/06/2021   Lab Results  Component Value Date   NA 141 04/06/2021   K 3.1 (L) 04/06/2021   CO2 23 04/06/2021   GLUCOSE 113 (H) 04/06/2021   BUN 9 04/06/2021   CREATININE 1.00 04/06/2021   BILITOT 2.2 (H) 04/06/2021   ALKPHOS 82 04/06/2021   AST 91 (H) 04/06/2021   ALT 22 04/06/2021   PROT 7.7 04/06/2021   ALBUMIN 3.7 04/06/2021   CALCIUM 9.8 04/06/2021   ANIONGAP 16 (H) 04/06/2021   Lab Results  Component Value Date   CHOL 200 (H) 01/06/2021   Lab Results  Component Value Date   HDL 41 01/06/2021   Lab Results  Component Value Date   LDLCALC 129 (H) 01/06/2021   Lab Results  Component Value Date   TRIG 182 (H) 01/06/2021   Lab Results  Component Value Date   CHOLHDL 4.9 01/06/2021   Lab Results  Component Value Date   HGBA1C 5.1 04/08/2021        Assessment & Plan:   Problem List Items Addressed This Visit       Digestive   Esophagitis, [place] grade D   Relevant Orders   Ambulatory referral to Gastroenterology   Chronic alcoholic liver disease ([place])    Due to get back in with GI. Has ben 18 months and platelets are low gain.       Relevant Orders   Ambulatory referral to Gastroenterology     Other   Thrombocytopenia ([place])    Repeat platelet check today and also need to get her back in with GI for yearly monitoring for his cirrhosis.  Hep a and B vaccines up-to-date.      Relevant Orders   Ambulatory referral to Gastroenterology   Allergic reaction - Primary    If doing well continue to taper the benadryl as tolerated.  Will refill/replace EPiPen.  Place referral to allergy and immunology for further work-up.  In the meantime we will check for IgE levels to apple.  Also consider it could be related to the blue dye in the Gatorade that he drinks very frequently.      Relevant Orders   BASIC METABOLIC PANEL WITH GFR  CBC with Differential/Platelet   Apple IgE   Ambulatory referral to Allergy   Other Visit Diagnoses     Abnormal glucose       Relevant Orders   POCT HgB A1C (Completed)   BASIC METABOLIC PANEL WITH GFR   CBC with Differential/Platelet   Apple IgE   Hypokalemia       Tinnitus, right ear       Relevant Orders   Ambulatory referral to ENT      Abnormal glucose-A1c looks great today no sign of diabetes or prediabetes.  Hypokalemia-recheck potassium level today.  He says he always has chronically low potassium so he may need to restart a supplement.  Meds ordered this encounter  Medications   EPINEPHrine 0.3 mg/0.3 mL IJ SOAJ injection    Sig: INJECT 0.3 MG INTO THE MUSCLE AS NEEDED FOR ANAPHYLAXIS.    Dispense:  2 each    Refill:  0   I spent 40 minutes on the day of the encounter to include pre-visit record review, face-to-face time with the patient and post visit ordering of  test.   Beatrice Lecher, MD

## 2021-04-08 NOTE — Assessment & Plan Note (Signed)
Repeat platelet check today and also need to get her back in with GI for yearly monitoring for his cirrhosis.  Hep a and B vaccines up-to-date.

## 2021-04-08 NOTE — Assessment & Plan Note (Signed)
If doing well continue to taper the benadryl as tolerated.  Will refill/replace EPiPen.  Place referral to allergy and immunology for further work-up.  In the meantime we will check for IgE levels to apple.  Also consider it could be related to the blue dye in the Gatorade that he drinks very frequently.

## 2021-04-08 NOTE — Assessment & Plan Note (Signed)
Continue to hold ACE inhibitor for now I do not think this is an ACE inhibitor reaction but blood pressure is still a little bit low and I would have symptoms to continue to improve and recheck labs before we restart the lisinopril.

## 2021-04-09 ENCOUNTER — Other Ambulatory Visit (HOSPITAL_BASED_OUTPATIENT_CLINIC_OR_DEPARTMENT_OTHER): Payer: Self-pay

## 2021-04-09 ENCOUNTER — Other Ambulatory Visit: Payer: Self-pay | Admitting: *Deleted

## 2021-04-09 DIAGNOSIS — R899 Unspecified abnormal finding in specimens from other organs, systems and tissues: Secondary | ICD-10-CM

## 2021-04-09 DIAGNOSIS — E876 Hypokalemia: Secondary | ICD-10-CM

## 2021-04-09 DIAGNOSIS — D649 Anemia, unspecified: Secondary | ICD-10-CM

## 2021-04-09 MED ORDER — POTASSIUM CHLORIDE CRYS ER 10 MEQ PO TBCR
10.0000 meq | EXTENDED_RELEASE_TABLET | Freq: Every day | ORAL | 0 refills | Status: DC
  Filled 2021-04-09: qty 90, 90d supply, fill #0

## 2021-04-09 NOTE — Addendum Note (Signed)
Addended by: Beatrice Lecher D on: 04/09/2021 07:59 AM   Modules accepted: Orders

## 2021-04-10 LAB — CBC WITH DIFFERENTIAL/PLATELET
Absolute Monocytes: 353 cells/uL (ref 200–950)
Basophils Absolute: 7 cells/uL (ref 0–200)
Basophils Relative: 0.1 %
Eosinophils Absolute: 0 cells/uL — ABNORMAL LOW (ref 15–500)
Eosinophils Relative: 0 %
HCT: 38.6 % (ref 38.5–50.0)
Hemoglobin: 13.1 g/dL — ABNORMAL LOW (ref 13.2–17.1)
Lymphs Abs: 734 cells/uL — ABNORMAL LOW (ref 850–3900)
MCH: 32.5 pg (ref 27.0–33.0)
MCHC: 33.9 g/dL (ref 32.0–36.0)
MCV: 95.8 fL (ref 80.0–100.0)
MPV: 10.7 fL (ref 7.5–12.5)
Monocytes Relative: 4.9 %
Neutro Abs: 6106 cells/uL (ref 1500–7800)
Neutrophils Relative %: 84.8 %
Platelets: 57 10*3/uL — ABNORMAL LOW (ref 140–400)
RBC: 4.03 10*6/uL — ABNORMAL LOW (ref 4.20–5.80)
RDW: 14.3 % (ref 11.0–15.0)
Total Lymphocyte: 10.2 %
WBC: 7.2 10*3/uL (ref 3.8–10.8)

## 2021-04-10 LAB — BASIC METABOLIC PANEL WITH GFR
BUN: 19 mg/dL (ref 7–25)
CO2: 25 mmol/L (ref 20–32)
Calcium: 9.3 mg/dL (ref 8.6–10.3)
Chloride: 101 mmol/L (ref 98–110)
Creat: 1.04 mg/dL (ref 0.70–1.30)
Glucose, Bld: 128 mg/dL — ABNORMAL HIGH (ref 65–99)
Potassium: 3.3 mmol/L — ABNORMAL LOW (ref 3.5–5.3)
Sodium: 140 mmol/L (ref 135–146)
eGFR: 86 mL/min/{1.73_m2} (ref >=60)

## 2021-04-10 LAB — FERRITIN: Ferritin: 153 ng/mL (ref 38–380)

## 2021-04-10 LAB — ALLERGEN, APPLE F49
Apple: 0.49 kU/L — ABNORMAL HIGH
CLASS: 1

## 2021-04-10 LAB — INTERPRETATION:

## 2021-04-10 LAB — IRON: Iron: 138 ug/dL (ref 50–180)

## 2021-04-14 ENCOUNTER — Other Ambulatory Visit: Payer: Self-pay

## 2021-04-14 ENCOUNTER — Ambulatory Visit (INDEPENDENT_AMBULATORY_CARE_PROVIDER_SITE_OTHER): Payer: 59 | Admitting: Sports Medicine

## 2021-04-14 DIAGNOSIS — M76821 Posterior tibial tendinitis, right leg: Secondary | ICD-10-CM | POA: Insufficient documentation

## 2021-04-14 HISTORY — DX: Posterior tibial tendinitis, right leg: M76.821

## 2021-04-14 NOTE — Progress Notes (Signed)
    Procedures performed today:    None.  Independent interpretation of notes and tests performed by another provider:   None.  Brief History, Exam, Impression, and Recommendations:    Tibialis posterior tendinitis, right This is a pleasant 55 year old male, for the past week or 2 he has had increasing pain behind the medial malleolus, distally to the navicular. On exam he has no pain over the navicular itself but only over the tibialis posterior and behind the malleolus, worse with resisted inversion. This is consistent with classic tibialis posterior tendinitis, we will do a boot for a couple of weeks, home rehab exercises, I will also have advanced home care PT do some work on his tibialis posterior and foot inverters. I would like him to touch base with Dr. Raeford Razor for custom molded orthotics. Return to see me in a month, will consider tibialis posterior injection (followed by boot immobilization for a week) if no better.    ___________________________________________ Gwen Her. Dianah Field, M.D., ABFM., CAQSM. Primary Care and [place] Instructor of [place] [employer] [facility] [employer] [facility]

## 2021-04-14 NOTE — Assessment & Plan Note (Signed)
This is a pleasant 55 year old male, for the past week or 2 he has had increasing pain behind the medial malleolus, distally to the navicular. On exam he has no pain over the navicular itself but only over the tibialis posterior and behind the malleolus, worse with resisted inversion. This is consistent with classic tibialis posterior tendinitis, we will do a boot for a couple of weeks, home rehab exercises, I will also have advanced home care PT do some work on his tibialis posterior and foot inverters. I would like him to touch base with Dr. Raeford Razor for custom molded orthotics. Return to see me in a month, will consider tibialis posterior injection (followed by boot immobilization for a week) if no better.

## 2021-04-17 ENCOUNTER — Ambulatory Visit (INDEPENDENT_AMBULATORY_CARE_PROVIDER_SITE_OTHER): Payer: 59

## 2021-04-17 ENCOUNTER — Encounter: Payer: Self-pay | Admitting: Physician Assistant

## 2021-04-17 ENCOUNTER — Ambulatory Visit: Payer: 59 | Admitting: Physician Assistant

## 2021-04-17 ENCOUNTER — Other Ambulatory Visit: Payer: Self-pay

## 2021-04-17 VITALS — BP 144/99 | HR 105 | Ht 65.0 in | Wt 168.6 lb

## 2021-04-17 DIAGNOSIS — R140 Abdominal distension (gaseous): Secondary | ICD-10-CM

## 2021-04-17 DIAGNOSIS — K709 Alcoholic liver disease, unspecified: Secondary | ICD-10-CM | POA: Diagnosis not present

## 2021-04-17 DIAGNOSIS — R1031 Right lower quadrant pain: Secondary | ICD-10-CM

## 2021-04-17 DIAGNOSIS — K5790 Diverticulosis of intestine, part unspecified, without perforation or abscess without bleeding: Secondary | ICD-10-CM

## 2021-04-17 DIAGNOSIS — R110 Nausea: Secondary | ICD-10-CM

## 2021-04-17 DIAGNOSIS — R509 Fever, unspecified: Secondary | ICD-10-CM

## 2021-04-17 DIAGNOSIS — R109 Unspecified abdominal pain: Secondary | ICD-10-CM | POA: Diagnosis not present

## 2021-04-17 DIAGNOSIS — Z23 Encounter for immunization: Secondary | ICD-10-CM

## 2021-04-17 HISTORY — DX: Right lower quadrant pain: R10.31

## 2021-04-17 LAB — POCT URINALYSIS DIP (CLINITEK)
Blood, UA: NEGATIVE
Glucose, UA: NEGATIVE mg/dL
Ketones, POC UA: NEGATIVE mg/dL
Leukocytes, UA: NEGATIVE
Nitrite, UA: NEGATIVE
POC PROTEIN,UA: 30 — AB
Spec Grav, UA: 1.015 (ref 1.010–1.025)
Urobilinogen, UA: 4 E.U./dL — AB
pH, UA: 8.5 — AB (ref 5.0–8.0)

## 2021-04-17 MED ORDER — IOHEXOL 300 MG/ML  SOLN
100.0000 mL | Freq: Once | INTRAMUSCULAR | Status: AC | PRN
  Administered 2021-04-17: 100 mL via INTRAVENOUS

## 2021-04-17 NOTE — Progress Notes (Signed)
Subjective:    Patient ID: Juan [name], male    DOB: Sep 04, 1965, 55 y.o.   MRN: [id number]  HPI Pt is a 55 yo obese male with chronic liver disease, thrombocytopenia who presents to the clinic with sudden right lower quadrant pain that started Wednesday night suddenly. Pain is improving some but 10/10 when takes a deep breath in. He had 101 fever yesterday but no fever today. No melena or hematochezia. No constipation or diarrhea. Some nausea but no vomiting. He has not been drinking alcohol or sodas. He does still have his appendix.   .. Active Ambulatory Problems    Diagnosis Date Noted   Myopathy 11/18/2018   MDD (major depressive disorder), recurrent severe, without psychosis ([place]) 04/15/2019   Laceration of extensor hallucis longus tendon, left, initial encounter 02/06/2017   History of bilateral inguinal hernia repair 01/19/2019   Elevated liver enzymes 01/07/2017   Esophagitis, [place] grade D 12/05/2019   GAD (generalized anxiety disorder) 04/16/2019   Fracture of laryngeal cartilage ([place]) 01/17/2020   Chronic post-traumatic stress disorder (PTSD) 04/16/2019   Chronic fatigue 12/21/2016   Chronic alcoholic liver disease ([place]) 09/30/2019   Benign essential hypertension 12/21/2016   History of alcohol abuse 05/20/2020   Impingement syndrome, shoulder, right 05/20/2020   Allergic reaction 07/08/2020   Primary osteoarthritis of first carpometacarpal joint of one hand, left 07/11/2020   COVID-19 virus infection 08/13/2020   Hypocalcemia 11/02/2020   Thrombocytopenia ([place]) 11/02/2020   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   Systolic murmur 07/09/3566   Right ankle sprain 02/12/2021   Relationship problems 03/23/2021   Dizziness 03/23/2021   Urinary frequency 03/23/2021   Tibialis posterior tendinitis, right 04/14/2021   Right lower quadrant pain 04/17/2021   Resolved Ambulatory Problems    Diagnosis Date Noted   Ulcerative colitis without complications ([place])  01/41/0301   Tear of right rotator cuff 04/21/2016   Synovitis of left ankle 07/29/2014   COVID-19 08/07/2020   Well adult exam 01/06/2021   Past Medical History:  Diagnosis Date   Alcohol addiction ([place])    Anxiety    Colon polyps    Depression    GERD (gastroesophageal reflux disease)    Hemochromatosis    Hx of blood clots    Hyperreflexia    Hypertension    Hypertension    PTSD (post-traumatic stress disorder)    Traumatic hemorrhagic shock (HCC)    Ulcerative colitis ([place])        Review of Systems   See HPI.  Objective:   Physical Exam Vitals reviewed.  Constitutional:      Appearance: Normal appearance. He is obese.  HENT:     Head: Normocephalic.  Cardiovascular:     Rate and Rhythm: Normal rate.  Pulmonary:     Effort: Pulmonary effort is normal.     Breath sounds: Normal breath sounds.  Abdominal:     General: There is distension.     Palpations: There is mass.     Tenderness: There is abdominal tenderness. There is no right CVA tenderness, left CVA tenderness, guarding or rebound.     Comments: Right mid to lower quadrant tenderness to palpation.  Enlarged liver palpated.   Musculoskeletal:     Right lower leg: No edema.     Left lower leg: No edema.  Neurological:     General: No focal deficit present.     Mental Status: He is alert.  Psychiatric:        Mood and Affect:  Mood normal.   UA-small bilirubin,negative blood, protein, negative nitrite/leukocytes.         Assessment & Plan:  Marland KitchenMarland KitchenElyon was seen today for right sided pain .  Diagnoses and all orders for this visit:  Right lower quadrant pain -     CBC w/Diff/Platelet -     COMPLETE METABOLIC PANEL WITH GFR -     Lipase -     Urine Culture -     POCT URINALYSIS DIP (CLINITEK)  Chronic alcoholic liver disease (HCC) -     CBC w/Diff/Platelet -     COMPLETE METABOLIC PANEL WITH GFR -     Lipase -     Urine Culture -     POCT URINALYSIS DIP (CLINITEK)  Need for shingles  vaccine -     Varicella-zoster vaccine IM  Abdominal distension (gaseous) -     Urine Culture -     POCT URINALYSIS DIP (CLINITEK) -     CT Abdomen Pelvis W Contrast; Future  Fever, unspecified -     Urine Culture -     POCT URINALYSIS DIP (CLINITEK)  Unclear etiology of symptoms.  UA does not support kidney stones or infection. Will get culture.  Not presenting like cholecystitis or appendicitis.  No bowel changes and not presenting like diverticulitis.  Reported fever is concerning with abdominal pain and will get STAT CT to rule out above DDx.   STAT CT reassuring and showed cirrhosis with no acute changes.  Discussed results with wife and suggested to treat plan like abdominal strain for now. Rest/hydration/warm compresses on abdomen. Follow up as needed or with any worsening symptoms.  Labs pending.

## 2021-04-18 LAB — CBC WITH DIFFERENTIAL/PLATELET
Absolute Monocytes: 1178 cells/uL — ABNORMAL HIGH (ref 200–950)
Basophils Absolute: 50 cells/uL (ref 0–200)
Basophils Relative: 0.5 %
Eosinophils Absolute: 119 cells/uL (ref 15–500)
Eosinophils Relative: 1.2 %
HCT: 40.4 % (ref 38.5–50.0)
Hemoglobin: 13.7 g/dL (ref 13.2–17.1)
Lymphs Abs: 2039 cells/uL (ref 850–3900)
MCH: 32.9 pg (ref 27.0–33.0)
MCHC: 33.9 g/dL (ref 32.0–36.0)
MCV: 96.9 fL (ref 80.0–100.0)
MPV: 11.1 fL (ref 7.5–12.5)
Monocytes Relative: 11.9 %
Neutro Abs: 6514 cells/uL (ref 1500–7800)
Neutrophils Relative %: 65.8 %
Platelets: 65 10*3/uL — ABNORMAL LOW (ref 140–400)
RBC: 4.17 10*6/uL — ABNORMAL LOW (ref 4.20–5.80)
RDW: 14.8 % (ref 11.0–15.0)
Total Lymphocyte: 20.6 %
WBC: 9.9 10*3/uL (ref 3.8–10.8)

## 2021-04-18 LAB — COMPLETE METABOLIC PANEL WITH GFR
AG Ratio: 1.2 (calc) (ref 1.0–2.5)
ALT: 24 U/L (ref 9–46)
AST: 73 U/L — ABNORMAL HIGH (ref 10–35)
Albumin: 4.1 g/dL (ref 3.6–5.1)
Alkaline phosphatase (APISO): 91 U/L (ref 35–144)
BUN: 12 mg/dL (ref 7–25)
CO2: 24 mmol/L (ref 20–32)
Calcium: 8.9 mg/dL (ref 8.6–10.3)
Chloride: 97 mmol/L — ABNORMAL LOW (ref 98–110)
Creat: 0.88 mg/dL (ref 0.70–1.30)
Globulin: 3.3 g/dL (calc) (ref 1.9–3.7)
Glucose, Bld: 89 mg/dL (ref 65–99)
Potassium: 3.2 mmol/L — ABNORMAL LOW (ref 3.5–5.3)
Sodium: 134 mmol/L — ABNORMAL LOW (ref 135–146)
Total Bilirubin: 3.3 mg/dL — ABNORMAL HIGH (ref 0.2–1.2)
Total Protein: 7.4 g/dL (ref 6.1–8.1)
eGFR: 103 mL/min/{1.73_m2} (ref >=60)

## 2021-04-18 LAB — LIPASE: Lipase: 101 U/L — ABNORMAL HIGH (ref 7–60)

## 2021-04-19 LAB — URINE CULTURE
MICRO NUMBER:: 12299439
SPECIMEN QUALITY:: ADEQUATE

## 2021-04-20 DIAGNOSIS — K5790 Diverticulosis of intestine, part unspecified, without perforation or abscess without bleeding: Secondary | ICD-10-CM | POA: Insufficient documentation

## 2021-04-20 HISTORY — DX: Diverticulosis of intestine, part unspecified, without perforation or abscess without bleeding: K57.90

## 2021-04-20 NOTE — Progress Notes (Signed)
No concerning bacteria in urine.  WBC normal and not concerning for infection.  Platelets stable from hx.  Monocytes are elevated usually indicating some chronic or sub acute infection.  Kidney function great.  Potassium close to baseline but down a little.  Sodium down a little.  Lipase is up some indicating some pancreas inflammation. CT scan pancreas looked normal. Certainly push fluids and keep a clear diet for next 48 hours.   How are you feeling today?

## 2021-04-20 NOTE — Progress Notes (Signed)
FYI. No acute changes. Came in for right mid lower abdominal pain. Lipase a little elevated. No other findings.

## 2021-04-29 DIAGNOSIS — R531 Weakness: Secondary | ICD-10-CM | POA: Diagnosis not present

## 2021-04-29 DIAGNOSIS — I10 Essential (primary) hypertension: Secondary | ICD-10-CM | POA: Diagnosis not present

## 2021-04-29 DIAGNOSIS — M76821 Posterior tibial tendinitis, right leg: Secondary | ICD-10-CM | POA: Diagnosis not present

## 2021-04-29 DIAGNOSIS — Z8639 Personal history of other endocrine, nutritional and metabolic disease: Secondary | ICD-10-CM | POA: Diagnosis not present

## 2021-04-30 ENCOUNTER — Encounter: Payer: Self-pay | Admitting: Family Medicine

## 2021-04-30 ENCOUNTER — Other Ambulatory Visit: Payer: Self-pay

## 2021-04-30 ENCOUNTER — Ambulatory Visit: Payer: 59 | Admitting: Family Medicine

## 2021-04-30 DIAGNOSIS — M76821 Posterior tibial tendinitis, right leg: Secondary | ICD-10-CM | POA: Diagnosis not present

## 2021-04-30 NOTE — Progress Notes (Signed)
Juan [name] - 55 y.o. male MRN [id number]  Date of birth: 1965/10/01  SUBJECTIVE:  Including CC & ROS.  No chief complaint on file.   Juan [name] is a 55 y.o. male that is presenting with foot pain.    Review of Systems See HPI   HISTORY: Past Medical, Surgical, Social, and Family History Reviewed & Updated per EMR.   Pertinent Historical Findings include:  Past Medical History:  Diagnosis Date   Alcohol addiction ([place])    Anxiety    Chronic fatigue    Colon polyps    Depression    GERD (gastroesophageal reflux disease)    Hemochromatosis    Hx of blood clots    Leg   Hyperreflexia    Hypertension    Hypertension    PTSD (post-traumatic stress disorder)    Traumatic hemorrhagic shock (HCC)    Ulcerative colitis ([place])     Past Surgical History:  Procedure Laterality Date   CARPAL TUNNEL RELEASE Bilateral    FRACTURE SURGERY     left ankle plate   HERNIA REPAIR     inguinal   KNEE SURGERY Right    x 4   SHOULDER SURGERY Bilateral    x 2   VASECTOMY      Family History  Problem Relation Age of Onset   Pulmonary fibrosis Mother    Other Father        liver failure   Breast cancer Paternal Aunt     Social History   Socioeconomic History   Marital status: Married    Spouse name: [name]   Number of children: 2   Years of education: Not on file   Highest education level: High school graduate  Occupational History    Comment: disability  Tobacco Use   Smoking status: Never   Smokeless tobacco: Current    Types: Snuff  Vaping Use   Vaping Use: Never used  Substance and Sexual Activity   Alcohol use: Not Currently   Drug use: Never   Sexual activity: Not on file  Other Topics Concern   Not on file  Social History Narrative   Lives with wife   Social Determinants of Health   Financial Resource Strain: Not on file  Food Insecurity: Not on file  Transportation Needs: Not on file  Physical Activity: Not on file  Stress: Not on file   Social Connections: Not on file  Intimate Partner Violence: Not on file     PHYSICAL EXAM:  VS: Ht 5' 5"  (1.651 m)   Wt 168 lb (76.2 kg)   BMI 27.96 kg/m  Physical Exam Gen: NAD, alert, cooperative with exam, well-appearing   Patient was fitted for a standard, cushioned, semi-rigid orthotic. The orthotic was heated and afterward the patient stood on the orthotic blank positioned on the orthotic stand. The patient was positioned in subtalar neutral position and 10 degrees of ankle dorsiflexion in a weight bearing stance. After completion of molding, a stable base was applied to the orthotic blank. The blank was ground to a stable position for weight bearing. Size: 8 Pairs: 2 Base: Blue EVA Additional Posting and Padding: None The patient ambulated these, and they were very comfortable.    ASSESSMENT & PLAN:   Tibialis posterior tendinitis, right He tends to pronate more on the right.  The orthotics to bring him more towards neutral.  He has had repeated injuries in both of his ankles and feet. -Counseled on home exercise therapy and supportive care. -  Orthotics today. -Could consider adding scaphoid pads.

## 2021-04-30 NOTE — Assessment & Plan Note (Signed)
He tends to pronate more on the right.  The orthotics to bring him more towards neutral.  He has had repeated injuries in both of his ankles and feet. -Counseled on home exercise therapy and supportive care. -Orthotics today. -Could consider adding scaphoid pads.

## 2021-05-07 ENCOUNTER — Telehealth: Payer: Self-pay | Admitting: Internal Medicine

## 2021-05-07 ENCOUNTER — Encounter: Payer: Self-pay | Admitting: Internal Medicine

## 2021-05-07 ENCOUNTER — Other Ambulatory Visit: Payer: Self-pay

## 2021-05-07 ENCOUNTER — Ambulatory Visit: Payer: 59 | Admitting: Internal Medicine

## 2021-05-07 VITALS — BP 144/88 | HR 95 | Temp 98.8°F | Resp 19 | Ht 63.0 in | Wt 177.8 lb

## 2021-05-07 DIAGNOSIS — T7800XA Anaphylactic reaction due to unspecified food, initial encounter: Secondary | ICD-10-CM

## 2021-05-07 DIAGNOSIS — T7840XA Allergy, unspecified, initial encounter: Secondary | ICD-10-CM | POA: Diagnosis not present

## 2021-05-07 DIAGNOSIS — T783XXA Angioneurotic edema, initial encounter: Secondary | ICD-10-CM

## 2021-05-07 NOTE — Progress Notes (Addendum)
NEW PATIENT Date of Service/Encounter:  05/07/21 Referring provider: [name], DO Primary care provider: [name], DO  Subjective:  Juan [name] is a 55 y.o. male with a PMHx of esophagitis, chronic alcoholic liver disease, thrombocytopenia, GERD, GAD presenting today for evaluation of allergic reaction. History obtained from: chart review and patient.   A few weeks ago, had physical therapy in the morning.  He drank apple juice and sauce.  He took a nap, and noticed his lip and right side of face was swollen.  He felt his throat was closing, and his voice was changing.  His wife gave him an Epipen.  These symptoms occurred 6 hours after eating the apple sauce and juice.  He denies vomiting, diarrhea, itching, rash.  He felt as thought he wasn't getting enough air, but was told by ENT that his lungs were moving adequate air.    He has a long-standing history of food allergies to multiple foods.  He had previous allergy testing (at a different allergist) and was positive to multiple foods.  This testing was prompted by eating peanut butter, he developed immediate throat swelling.  His testing was positive to shrimp, cantaloupe, cucumber, strawberry, peanut butter.  He has avoided these foods since his previous testing.  Prior to testing, he was eating all of these foods without reactions.  He is a Careers adviser.  He was taken off his ACE-I because it was considered that this might be causing his swelling.  He has not restarted this.  He denies any other swelling episodes in between or following these episodes.   His swelling did resolve within a few hours.   It was completely resolved within 24 hours.  Codeine makes him itch.  Chart Review:  PCP visit on 04/08/2021-regarding ED visit on 04/06/21-developed right-sided facial swelling, difficulty swallowing, vomiting, had eaten apple juice earlier that day, Epi administered at home by patient, and second dose given by EMS, in ED  was given Methylpred and IV fluids with H2 blocker.  ACE inhibitor was held due to lip swelling.  Patient reported having apple juice earlier that morning.  On 08/16 reported repeat swelling after drinking a Gatorade. IgE to apple obtaining 0.49 CBC differential reviewed and showed number cytopenia mild lymphopenia, AEC 0  Other allergy screening: Asthma: no Rhino conjunctivitis: no Food allergy: yes Medication allergy: yes Hymenoptera allergy: no Urticaria: no Eczema:no  Past Medical History: Past Medical History:  Diagnosis Date   Alcohol addiction ([place])    Anxiety    Chronic fatigue    Colon polyps    Depression    GERD (gastroesophageal reflux disease)    Hemochromatosis    Hx of blood clots    Leg   Hyperreflexia    Hypertension    Hypertension    PTSD (post-traumatic stress disorder)    Traumatic hemorrhagic shock (HCC)    Ulcerative colitis ([place])    Medication List:  Current Outpatient Medications  Medication Sig Dispense Refill   aspirin 81 MG chewable tablet Chew 81 mg by mouth daily.     B Complex Vitamins (B COMPLEX PO) Take by mouth daily.     busPIRone (BUSPAR) 10 MG tablet TAKE 2 TABLETS BY MOUTH THREE TIMES DAILY 540 tablet 1   Cholecalciferol 50 MCG (2000 UT) CAPS Take 1 capsule by mouth daily.     EPINEPHrine 0.3 mg/0.3 mL IJ SOAJ injection INJECT 0.3 MG INTO THE MUSCLE AS NEEDED FOR ANAPHYLAXIS. 2 each 0   escitalopram (LEXAPRO)  20 MG tablet Take 20 mg by mouth daily.     famotidine (PEPCID) 20 MG tablet Take 1 tablet (20 mg total) by mouth 2 (two) times daily. 30 tablet 0   folic acid (FOLVITE) 1 MG tablet TAKE 1 TABLET (1 MG TOTAL) BY MOUTH DAILY. 90 tablet 1   HYDROcodone-acetaminophen (NORCO/VICODIN) 5-325 MG tablet Take 1-2 tablets by mouth every 4-6 hours as needed for pain for 5 days. NOT TO EXCEED 10 TABLETS A DAY. 35 tablet 0   lisinopril (ZESTRIL) 10 MG tablet TAKE 1 TABLET (10 MG TOTAL) BY MOUTH DAILY. 90 tablet 3   Magnesium Oxide 400 (240  Mg) MG TABS Take 240 mg by mouth.     mirtazapine (REMERON) 15 MG tablet Take 15 mg by mouth at bedtime.     Multiple Vitamins-Minerals (YOUR LIFE MULTI ADULT GUMMIES) CHEW Chew 1 tablet by mouth daily.     nicotine (NICODERM CQ - DOSED IN MG/24 HR) 7 mg/24hr patch APPLY 1 PATCH TO SKIN ONCE A DAY *APPLY TO NON-HAIRY, CLEAN, DRY AREA (NO TOBACCO PRODUCTS)* FOR 2 WEEKS - BEGIN AFTER 14MG PATCHES COMPLETE     pantoprazole (PROTONIX) 40 MG tablet TAKE 1 TABLET (40 MG TOTAL) BY MOUTH DAILY. 90 tablet 3   potassium chloride (KLOR-CON) 10 MEQ tablet Take 1 tablet (10 mEq total) by mouth daily. 90 tablet 0   saccharomyces boulardii (FLORASTOR) 250 MG capsule Take 250 mg by mouth 2 (two) times daily.      thiamine 100 MG tablet Take 100 mg by mouth daily.     predniSONE (DELTASONE) 10 MG tablet Take 4 tablets (40 mg total) by mouth daily. (Patient not taking: Reported on 05/07/2021) 20 tablet 0   No current facility-administered medications for this visit.   Known Allergies:  Allergies  Allergen Reactions   Charentais Melon (French Melon) Anaphylaxis   Cucumber Extract Itching and Nausea And Vomiting    No extracts; just cucumber    Other Anaphylaxis   Peanut Butter Flavor Anaphylaxis and Swelling   Peanut Oil Swelling   Shellfish Allergy Itching and Swelling   Cantaloupe Extract Allergy Skin Test Rash   Lactose Other (See Comments)   Strawberry Extract Nausea And Vomiting and Swelling   Vancomycin Rash   Codeine     itching   Depakote Er [Divalproex Sodium Er]     Tongue swelling   Past Surgical History: Past Surgical History:  Procedure Laterality Date   CARPAL TUNNEL RELEASE Bilateral    FRACTURE SURGERY     left ankle plate   HERNIA REPAIR     inguinal   KNEE SURGERY Right    x 4   SHOULDER SURGERY Bilateral    x 2   VASECTOMY     Family History: Family History  Problem Relation Age of Onset   Pulmonary fibrosis Mother    Other Father        liver failure   Breast  cancer Paternal Aunt    Social History: Collin lives in a house built 20 years ago, with LPV wood in the bedroom, electric heating, central AC, 4 dogs and 3 cats, no visible cockroaches, uses dust mite covers for bedding with the bed at least 2 feet off the floor.  No tobacco smoke in the home, but he does have a smoking history.  Currently disabled.  Home with HEPA filter.  [place] or [place].  ROS:  All other systems negative except as noted per  HPI.  Objective:  Blood pressure (!) 144/88, pulse 95, temperature 98.8 F (37.1 C), temperature source Temporal, resp. rate 19, height 5' 3"  (1.6 m), weight 177 lb 12.8 oz (80.6 kg), SpO2 96 %. Body mass index is 31.5 kg/m. Physical Exam:  General Appearance:  Alert, cooperative, no distress, appears stated age  Head:  Normocephalic, without obvious abnormality, atraumatic  Eyes:  Conjunctiva clear, EOM's intact  Nose: Nares normal  Ears EACs normal bilaterally, Tms normal bilaterally  Throat: Lips, tongue normal; teeth with plaque build up, normal posterior oropharynx  Neck: Supple, symmetrical  Lungs:   CTAB, Respirations unlabored, no coughing  Heart:  RRR, 3/6 systolic murmur, Appears well perfused  Extremities: No edema  Skin: Skin color, texture, turgor normal, no rashes or lesions on visualized portions of skin  Neurologic: No gross deficits    Assessment:  Angioedema, initial encounter - Angioedema can be subcategorized into a histamine-mediated process, a bradykinin-mediated process, or a miscellaneous process (I.e. venous congestion, granulomatous cheilitis, infection, trauma, etc).   Mr. Langdon overlaps with both of these categories.  His allergy testing Apple was positive, but the timeframe between apple ingestion and symptoms is unusually prolonged.  Without doing an oral challenge, would not be able to rule this out as a possibility.  He denies ingestion of any other food that day, so further  food allergy testing was not pursued.  Did briefly discuss the possibility of alpha gal allergy which can cause delayed symptoms following mammalian meat consumption in a sensitized person.  We will screen for this today.  We will also screen for mast cell syndrome which can cause idiopathic anaphylaxis and/or swelling episodes.  Additionally he is taking an ACE inhibitor which can cause isolated swelling.  Unfortunately, there is no testing for this.  Usually swelling associated with this last for several days, but this cannot be ruled out as a possibility at this time.  Additionally, he has known liver disease which could affect complement levels putting him at risk for a bradykinin mediated reaction.  This can also lead to venous congestion but typically the swelling associated with this is chronic making this an unlikely cause.  Will screen complement levels today.  His last CBC differential showed monocytosis and thrombocytopenia.  Occasionally lymphoproliferative disorders are associated with bradykinin mediated pathway.  We will do a more in-depth screen for these as well.  Food allergy-stable - possible new allergy to apple, avoidance for now   Murmur noted on exam today.  Patient reports this has been noted by his PCP since one month ago who will continue to monitor this finding. Plan/Recommendations:   Patient Instructions  Angioedema (tissue swelling):  - today we discussed both of the common types of swelling  - bradykinin mediated (usually no itching or hives, lasts ~2-3 days, doesn't respond to antihistamines or epinephrine) Vs - histamine mediated (usually with hives or itching, lasts 1-2 days, responds to antihistamines or epinephrine) - you overlap with both categories, so we will do testing to rule out some common causes of both - obtain today labs for histamine causes: tryptase, alpha gal panel (red meat allergy) - please avoid mammalian meats (pork, beef, venison, etc) until  your alpha gal panel returns - obtain today labs for bradykinin-associated causes: C4, C1 esterase function/level, C1q, CBCd, immunoglobulin levels, T, B, NK cells enumeration, SPEP and UPEP  For Now:  - please avoid apples in all forms, as well as your ACE Inhibitor (okay to use ARB) - Epipen  to be injected in your outer thigh if concern for airway involvement (throat closing, difficulty breathing, etc) - if you feel airway involvement, please call 911 and seek emergency medical care - please provide them with the tryptase order to be obtained during acute attack if recurs - we will contact you with results  Food Allergy:  Discussed avoidance of his other known food allergies (peanut, strawberry, cucumber, cantaloupe and shrimp) as well as newly diagnosed apple.  Can consider updating this testing in future if patient interested.  He carries an Epipen, and reports not needing refills today. Allergy list updated.  Follow-up pending labs.  This note in its entirety was forwarded to the Provider who requested this consultation.  Thank you for your kind referral. I appreciate the opportunity to take part in Terel's care. Please do not hesitate to contact me with questions.  Sincerely,  Sigurd Sos, MD [facility]

## 2021-05-07 NOTE — Telephone Encounter (Signed)
Pt states his arm is itching after being in the sun earlier today.

## 2021-05-07 NOTE — Telephone Encounter (Signed)
Told patient to use hydrocortisone cream and may take benadryl if needed.

## 2021-05-07 NOTE — Patient Instructions (Addendum)
Angioedema (tissue swelling):  - today we discussed both of the common types of swelling  - bradykinin mediated (usually no itching or hives, lasts ~2-3 days, doesn't respond to antihistamines or epinephrine) Vs - histamine mediated (usually with hives or itching, lasts 1-2 days, responds to antihistamines or epinephrine) - you overlap with both categories, so we will do testing to rule out some common causes of both - obtain today labs for histamine causes: tryptase, alpha gal panel (red meat allergy) - please avoid mammalian meats (pork, beef, venison, etc) until your alpha gal panel returns - obtain today labs for bradykinin-associated causes: C4, C1 esterase function/level, C1q, CBCd, immunoglobulin levels, T, B, NK cells enumeration, SPEP and UPEP  For Now:  - please avoid apples in all forms, as well as your ACE Inhibitor (okay to use ARB) - Epipen to be injected in your outer thigh if concern for airway involvement (throat closing, difficulty breathing, etc) - if you feel airway involvement, please call 911 and seek emergency medical care - please provide them with the tryptase order to be obtained during acute attack if recurs - we will contact you with results

## 2021-05-12 ENCOUNTER — Ambulatory Visit (INDEPENDENT_AMBULATORY_CARE_PROVIDER_SITE_OTHER): Payer: 59 | Admitting: Sports Medicine

## 2021-05-12 ENCOUNTER — Other Ambulatory Visit (HOSPITAL_BASED_OUTPATIENT_CLINIC_OR_DEPARTMENT_OTHER): Payer: Self-pay

## 2021-05-12 ENCOUNTER — Other Ambulatory Visit: Payer: Self-pay

## 2021-05-12 ENCOUNTER — Ambulatory Visit (INDEPENDENT_AMBULATORY_CARE_PROVIDER_SITE_OTHER): Payer: 59

## 2021-05-12 DIAGNOSIS — B353 Tinea pedis: Secondary | ICD-10-CM | POA: Diagnosis not present

## 2021-05-12 DIAGNOSIS — M76821 Posterior tibial tendinitis, right leg: Secondary | ICD-10-CM

## 2021-05-12 HISTORY — DX: Tinea pedis: B35.3

## 2021-05-12 MED ORDER — CLOTRIMAZOLE-BETAMETHASONE 1-0.05 % EX CREA
1.0000 "application " | TOPICAL_CREAM | Freq: Two times a day (BID) | CUTANEOUS | 0 refills | Status: DC
  Filled 2021-05-12: qty 45, 23d supply, fill #0

## 2021-05-12 NOTE — Assessment & Plan Note (Signed)
Noted tinea pedis on exam of his ankle, adding topical Lotrisone.

## 2021-05-12 NOTE — Assessment & Plan Note (Signed)
Juan [name] returns, he is a pleasant 55 year old male, persistence of pain behind the right medial malleolus just proximal to the navicular consistent with tibialis posterior tendinitis, we did get him in with Dr. Raeford Razor for custom molded orthotics which have been moderately efficacious, I am going to add a scaphoid pad to his orthotics. Due to severe pain we injected his tibialis posterior tendon sheath today with ultrasound guidance, he will wear a boot for a week to protect the weightbearing tendon. Return to see me in approximately 4 weeks to reevaluate.

## 2021-05-12 NOTE — Progress Notes (Signed)
    Procedures performed today:    Procedure: Real-time Ultrasound Guided injection of the right tibialis posterior tendon sheath Device: Samsung [id number]  Verbal informed consent obtained.  Time-out conducted.  Noted no overlying erythema, induration, or other signs of local infection.  Skin prepped in a sterile fashion.  Local anesthesia: Topical Ethyl chloride.  With sterile technique and under real time ultrasound guidance: Taking care to avoid intratendinous injection I did notice a tendon sheath effusion and what appeared to be a longitudinal split in the tibialis posterior, 1 cc Kenalog 40, 1 cc lidocaine, 1 cc bupivacaine injected easily Completed without difficulty  Advised to call if fevers/chills, erythema, induration, drainage, or persistent bleeding.  Images permanently stored and available for review in PACS.  Impression: Technically successful ultrasound guided injection.  Independent interpretation of notes and tests performed by another provider:   None.  Brief History, Exam, Impression, and Recommendations:    Tinea pedis Noted tinea pedis on exam of his ankle, adding topical Lotrisone.  Tibialis posterior tendinitis, right Uthman returns, he is a pleasant 55 year old male, persistence of pain behind the right medial malleolus just proximal to the navicular consistent with tibialis posterior tendinitis, we did get him in with Dr. Raeford Razor for custom molded orthotics which have been moderately efficacious, I am going to add a scaphoid pad to his orthotics. Due to severe pain we injected his tibialis posterior tendon sheath today with ultrasound guidance, he will wear a boot for a week to protect the weightbearing tendon. Return to see me in approximately 4 weeks to reevaluate.    ___________________________________________ Gwen Her. Dianah Field, M.D., ABFM., CAQSM. Primary Care and [place] [employer] [facility]

## 2021-05-14 ENCOUNTER — Ambulatory Visit: Payer: 59 | Admitting: Family Medicine

## 2021-05-18 DIAGNOSIS — M6281 Muscle weakness (generalized): Secondary | ICD-10-CM | POA: Diagnosis not present

## 2021-05-19 ENCOUNTER — Other Ambulatory Visit: Payer: Self-pay

## 2021-05-27 ENCOUNTER — Other Ambulatory Visit: Payer: Self-pay

## 2021-05-28 DIAGNOSIS — F1021 Alcohol dependence, in remission: Secondary | ICD-10-CM | POA: Diagnosis not present

## 2021-05-28 DIAGNOSIS — G4700 Insomnia, unspecified: Secondary | ICD-10-CM | POA: Diagnosis not present

## 2021-05-28 DIAGNOSIS — F419 Anxiety disorder, unspecified: Secondary | ICD-10-CM | POA: Diagnosis not present

## 2021-06-02 ENCOUNTER — Other Ambulatory Visit (HOSPITAL_BASED_OUTPATIENT_CLINIC_OR_DEPARTMENT_OTHER): Payer: Self-pay

## 2021-06-02 ENCOUNTER — Other Ambulatory Visit: Payer: Self-pay

## 2021-06-02 ENCOUNTER — Encounter: Payer: Self-pay | Admitting: Family Medicine

## 2021-06-02 ENCOUNTER — Ambulatory Visit (INDEPENDENT_AMBULATORY_CARE_PROVIDER_SITE_OTHER): Payer: 59 | Admitting: Family Medicine

## 2021-06-02 VITALS — BP 138/81 | HR 90 | Ht 65.0 in | Wt 173.0 lb

## 2021-06-02 DIAGNOSIS — I10 Essential (primary) hypertension: Secondary | ICD-10-CM

## 2021-06-02 DIAGNOSIS — G729 Myopathy, unspecified: Secondary | ICD-10-CM

## 2021-06-02 DIAGNOSIS — R296 Repeated falls: Secondary | ICD-10-CM | POA: Diagnosis not present

## 2021-06-02 DIAGNOSIS — R269 Unspecified abnormalities of gait and mobility: Secondary | ICD-10-CM

## 2021-06-02 DIAGNOSIS — F332 Major depressive disorder, recurrent severe without psychotic features: Secondary | ICD-10-CM

## 2021-06-02 MED ORDER — AMLODIPINE BESYLATE 2.5 MG PO TABS
2.5000 mg | ORAL_TABLET | Freq: Every day | ORAL | 1 refills | Status: DC
  Filled 2021-06-02: qty 90, 90d supply, fill #0

## 2021-06-02 MED ORDER — DULOXETINE HCL 20 MG PO CPEP
20.0000 mg | ORAL_CAPSULE | Freq: Every day | ORAL | 3 refills | Status: DC
  Filled 2021-06-02: qty 30, 30d supply, fill #0
  Filled 2021-07-24: qty 30, 30d supply, fill #1

## 2021-06-02 NOTE — Assessment & Plan Note (Signed)
Blood pressures continue to fluctuate quite a bit at home.  Has some higher readings throughout the day.  Starting low-dose amlodipine at 2.5 mg daily.

## 2021-06-02 NOTE — Patient Instructions (Signed)
Nice to see you today! I would recommend starting physical therapy.  Aquatic therapy may also be beneficial.  Let's try adding duloxetine (cymbalta).  I would recommend decrease of lexapro to 1/2 tab.  Please also discuss with your behavioral health provider.

## 2021-06-02 NOTE — Progress Notes (Signed)
Juan [name] - 55 y.o. male MRN [id number]  Date of birth: [birth year]  Subjective Chief Complaint  Patient presents with   Follow-up    HPI Juan [name] is a 55 year old male here today for follow-up visit.  He is accompanied by his wife today.  Reports continued decline and strength and endurance.  This is contributing to gait abnormalities and recurrent falls.  He has had home physical therapy previously which was helpful however he reports that he was told that he canceled too many appointments and they stopped coming out.  He would be willing to try outpatient physical therapy.  Chronic pain does contribute to this some but describes more of a feeling of weakness.  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  Continues with posttraumatic stress disorder.  This is managed by psychiatry at the [place].  Prazosin was recently increased due to more nightmares.  He is sleeping a little better with this.  Reports that blood pressure continues to fluctuate throughout the day.  Feels like he needs to be back on medication as he does tend to run high towards the afternoons.  He has not had chest pain, shortness of breath, palpitations, headache or vision changes.  ROS:  A comprehensive ROS was completed and negative except as noted per HPI   Allergies  Allergen Reactions   Charentais Melon (French Melon) Anaphylaxis   Cucumber Extract Itching and Nausea And Vomiting    No extracts; just cucumber    Other Anaphylaxis   Peanut Butter Flavor Anaphylaxis and Swelling   Peanut Oil Swelling   Shellfish Allergy Itching and Swelling   Cantaloupe Extract Allergy Skin Test Rash   Lactose Other (See Comments)   Strawberry Extract Nausea And Vomiting and Swelling   Vancomycin Rash   Apple Swelling   Codeine     itching   Depakote Er [Divalproex Sodium Er]     Tongue swelling    Past Medical History:  Diagnosis Date   Alcohol  addiction (HCC)    Anxiety    Chronic fatigue    Colon polyps    Depression    GERD (gastroesophageal reflux disease)    Hemochromatosis    Hx of blood clots    Leg   Hyperreflexia    Hypertension    Hypertension    PTSD (post-traumatic stress disorder)    Traumatic hemorrhagic shock (HCC)    Ulcerative colitis (HCC)     Past Surgical History:  Procedure Laterality Date   CARPAL TUNNEL RELEASE Bilateral    FRACTURE SURGERY     left ankle plate   HERNIA REPAIR     inguinal   KNEE SURGERY Right    x 4   SHOULDER SURGERY Bilateral    x 2   VASECTOMY      Social History   Socioeconomic History   Marital status: Married    Spouse name: Juan [name]   Number of children: 2   Years of education: Not on file   Highest education level: High school graduate  Occupational History    Comment: disability  Tobacco Use   Smoking status: Never   Smokeless tobacco: Current    Types: Snuff  Vaping Use   Vaping Use: Never used  Substance and Sexual Activity   Alcohol use: Not Currently   Drug use: Never   Sexual activity: Not on file  Other Topics Concern   Not on file  Social History Narrative  Lives with wife   Social Determinants of Health   Financial Resource Strain: Not on file  Food Insecurity: Not on file  Transportation Needs: Not on file  Physical Activity: Not on file  Stress: Not on file  Social Connections: Not on file    Family History  Problem Relation Age of Onset   Pulmonary fibrosis Mother    Other Father        liver failure   Breast cancer Paternal Aunt     Health Maintenance  Topic Date Due   COVID-19 Vaccine (4 - Booster for [place] series) 03/05/2021   INFLUENZA VACCINE  03/23/2021   TETANUS/TDAP  06/20/2029   COLONOSCOPY (Pts 45-37yr Insurance coverage will need to be confirmed)  10/14/2029   Hepatitis C Screening  Completed   HIV Screening  Completed   Zoster Vaccines- Shingrix  Completed   HPV VACCINES  Aged Out      ----------------------------------------------------------------------------------------------------------------------------------------------------------------------------------------------------------------- Physical Exam BP 138/81 (BP Location: Left Arm, Patient Position: Sitting, Cuff Size: Normal)   Pulse 90   Ht 5' 5"  (1.651 m)   Wt 173 lb (78.5 kg)   SpO2 94%   BMI 28.79 kg/m   Physical Exam Constitutional:      Appearance: Normal appearance.  Cardiovascular:     Rate and Rhythm: Normal rate and regular rhythm.  Pulmonary:     Effort: Pulmonary effort is normal.     Breath sounds: Normal breath sounds.  Musculoskeletal:     Cervical back: Neck supple.  Neurological:     Mental Status: He is alert.     Comments: Mild weakness in all extremities.  More pronounced in the lower extremities.  Mild lower extremity atrophy.  He walks with unsteady/antalgic gait.    ------------------------------------------------------------------------------------------------------------------------------------------------------------------------------------------------------------------- Assessment and Plan  Benign essential hypertension Blood pressures continue to fluctuate quite a bit at home.  Has some higher readings throughout the day.  Starting low-dose amlodipine at 2.5 mg daily.  Myopathy Chronic alcoholic myopathy and neuropathy.  Continues to have progressive weakness that worsens throughout the day.  Adding on formal physical therapy here at our facility.  He will continue to use assistive devices as needed at home.  He [place] [facility] [place] [facility] [place] with [place] into his house as he is unable to have ramp due to space.  MDD (major depressive disorder), recurrent severe, without psychosis ([facility]  I did recommend trying addition of low-dose Cymbalta to help with chronic pain as well as diminished energy levels throughout  the day.  He will discuss this with his psychiatrist as well.  Did recommend that he reduce his Lexapro to half tab if he does start this.   Meds ordered this encounter  Medications   DULoxetine (CYMBALTA) 20 MG capsule    Sig: Take 1 capsule (20 mg total) by mouth daily.    Dispense:  30 capsule    Refill:  3   amLODipine (NORVASC) 2.5 MG tablet    Sig: Take 1 tablet (2.5 mg total) by mouth daily.    Dispense:  90 tablet    Refill:  1    Return in about 3 months (around 09/02/2021) for Weakness/Falls.    This visit occurred during the SARS-CoV-2 public health emergency.  Safety protocols were in place, including screening questions prior to the visit, additional usage of staff PPE, and extensive cleaning of exam room while observing appropriate contact time as indicated for disinfecting  solutions.

## 2021-06-02 NOTE — Assessment & Plan Note (Signed)
Chronic alcoholic myopathy and neuropathy.  Continues to have progressive weakness that worsens throughout the day.  Adding on formal physical therapy here at our facility.  He will continue to use assistive devices as needed at home.  He has applied for a lift from the [place] to help with navigating steps into his house as he is unable to have ramp due to space.

## 2021-06-02 NOTE — Assessment & Plan Note (Signed)
Management per psychiatry at the [facility].  I did recommend trying addition of low-dose Cymbalta to help with chronic pain as well as diminished energy levels throughout the day.  He will discuss this with his psychiatrist as well.  Did recommend that he reduce his Lexapro to half tab if he does start this.

## 2021-06-04 ENCOUNTER — Encounter: Payer: Self-pay | Admitting: Family Medicine

## 2021-06-06 ENCOUNTER — Other Ambulatory Visit: Payer: Self-pay

## 2021-06-06 ENCOUNTER — Emergency Department (HOSPITAL_BASED_OUTPATIENT_CLINIC_OR_DEPARTMENT_OTHER): Payer: 59

## 2021-06-06 ENCOUNTER — Emergency Department (HOSPITAL_BASED_OUTPATIENT_CLINIC_OR_DEPARTMENT_OTHER)
Admission: EM | Admit: 2021-06-06 | Discharge: 2021-06-06 | Disposition: A | Discharge status: Home / Self Care | Payer: 59 | Attending: Emergency Medicine | Admitting: Emergency Medicine

## 2021-06-06 ENCOUNTER — Encounter (HOSPITAL_BASED_OUTPATIENT_CLINIC_OR_DEPARTMENT_OTHER): Payer: Self-pay | Admitting: *Deleted

## 2021-06-06 DIAGNOSIS — Z7982 Long term (current) use of aspirin: Secondary | ICD-10-CM | POA: Diagnosis not present

## 2021-06-06 DIAGNOSIS — I10 Essential (primary) hypertension: Secondary | ICD-10-CM | POA: Diagnosis not present

## 2021-06-06 DIAGNOSIS — S6992XA Unspecified injury of left wrist, hand and finger(s), initial encounter: Secondary | ICD-10-CM | POA: Diagnosis present

## 2021-06-06 DIAGNOSIS — S61213A Laceration without foreign body of left middle finger without damage to nail, initial encounter: Secondary | ICD-10-CM | POA: Diagnosis not present

## 2021-06-06 DIAGNOSIS — Z79899 Other long term (current) drug therapy: Secondary | ICD-10-CM | POA: Insufficient documentation

## 2021-06-06 DIAGNOSIS — W231XXA Caught, crushed, jammed, or pinched between stationary objects, initial encounter: Secondary | ICD-10-CM | POA: Insufficient documentation

## 2021-06-06 DIAGNOSIS — Z91010 Allergy to peanuts: Secondary | ICD-10-CM | POA: Insufficient documentation

## 2021-06-06 MED ORDER — LIDOCAINE-EPINEPHRINE (PF) 2 %-1:200000 IJ SOLN
INTRAMUSCULAR | Status: AC
  Administered 2021-06-06: 20 mL
  Filled 2021-06-06: qty 20

## 2021-06-06 MED ORDER — LIDOCAINE-EPINEPHRINE 2 %-1:100000 IJ SOLN: 20.0000 mL | Freq: Once | INTRAMUSCULAR | Status: DC

## 2021-06-06 NOTE — ED Triage Notes (Signed)
Pt has injury to left middle finger from when he folded up his wheelchair pta.  Pt appears to have a superficial laceration above joint.  Last tetanus was 3 years ago.

## 2021-06-06 NOTE — ED Provider Notes (Signed)
[facility] Provider Note: Georgena Spurling, MD, FACEP  CSN: [id number] MRN: [id number] ARRIVAL: 06/06/21 at [place]: [id number]/[id number]   CHIEF COMPLAINT  Finger Injury   HISTORY OF PRESENT ILLNESS  06/06/21 1:02 AM Juan [name] is a 55 y.o. male who pinched his left middle finger in his wheelchair about 11 PM yesterday evening.  He has a laceration overlying the dorsal PIP joint.  He rates associated pain is a 4 out of 10, worse with movement or palpation.  There is no deficit in sensation, flexion or extension.  Tetanus is up-to-date.  There is no associated deformity.   Past Medical History:  Diagnosis Date   Alcohol addiction ([place])    Anxiety    Chronic fatigue    Colon polyps    Depression    GERD (gastroesophageal reflux disease)    Hemochromatosis    Hx of blood clots    Leg   Hyperreflexia    Hypertension    PTSD (post-traumatic stress disorder)    Traumatic hemorrhagic shock (HCC)    Ulcerative colitis ([place])     Past Surgical History:  Procedure Laterality Date   CARPAL TUNNEL RELEASE Bilateral    FRACTURE SURGERY     left ankle plate   HERNIA REPAIR     inguinal   KNEE SURGERY Right    x 4   SHOULDER SURGERY Bilateral    x 2   VASECTOMY      Family History  Problem Relation Age of Onset   Pulmonary fibrosis Mother    Other Father        liver failure   Breast cancer Paternal Aunt     Social History   Tobacco Use   Smoking status: Never   Smokeless tobacco: Current    Types: Snuff  Vaping Use   Vaping Use: Never used  Substance Use Topics   Alcohol use: Not Currently   Drug use: Never    Prior to Admission medications   Medication Sig Start Date End Date Taking? Authorizing Provider  amLODipine (NORVASC) 2.5 MG tablet Take 1 tablet (2.5 mg total) by mouth daily. 06/02/21   [name], DO  aspirin 81 MG chewable tablet Chew 81 mg by mouth daily.    [provider]  B Complex Vitamins (B COMPLEX PO) Take by mouth  daily.    [provider]  busPIRone (BUSPAR) 10 MG tablet TAKE 2 TABLETS BY MOUTH THREE TIMES DAILY 07/10/20 07/10/21  [name], DO  Cholecalciferol 50 MCG (2000 UT) CAPS Take 1 capsule by mouth daily.    [provider]  clotrimazole-betamethasone (LOTRISONE) cream Apply 1 application topically 2 (two) times daily. 05/12/21   Silverio Decamp, MD  DULoxetine (CYMBALTA) 20 MG capsule Take 1 capsule (20 mg total) by mouth daily. 06/02/21   [name], DO  EPINEPHrine 0.3 mg/0.3 mL IJ SOAJ injection INJECT 0.3 MG INTO THE MUSCLE AS NEEDED FOR ANAPHYLAXIS. 04/08/21 04/08/22  Hali Marry, MD  escitalopram (LEXAPRO) 20 MG tablet Take 20 mg by mouth daily. 03/07/20   [provider]  famotidine (PEPCID) 20 MG tablet Take 1 tablet (20 mg total) by mouth 2 (two) times daily. 04/06/21   Fredia Sorrow, MD  HYDROcodone-acetaminophen (NORCO/VICODIN) 5-325 MG tablet Take 1-2 tablets by mouth every 4-6 hours as needed for pain for 5 days. NOT TO EXCEED 10 TABLETS A DAY. 01/08/21     Magnesium Oxide 400 (240 Mg) MG TABS Take 240 mg by mouth.  [provider]  mirtazapine (REMERON) 15 MG tablet Take 15 mg by mouth at bedtime. 03/07/20   [provider]  Multiple Vitamins-Minerals (YOUR LIFE MULTI ADULT GUMMIES) CHEW Chew 1 tablet by mouth daily.    [provider]  nicotine (NICODERM CQ - DOSED IN MG/24 HR) 7 mg/24hr patch APPLY 1 PATCH TO SKIN ONCE A DAY *APPLY TO NON-HAIRY, CLEAN, DRY AREA (NO TOBACCO PRODUCTS)* FOR 2 WEEKS - BEGIN AFTER 14MG PATCHES COMPLETE 04/29/21   [provider]  pantoprazole (PROTONIX) 40 MG tablet TAKE 1 TABLET (40 MG TOTAL) BY MOUTH DAILY. 11/13/20 11/13/21  [name], DO  potassium chloride (KLOR-CON) 10 MEQ tablet Take 1 tablet (10 mEq total) by mouth daily. 04/09/21   Hali Marry, MD  prazosin (MINIPRESS) 2 MG capsule TAKE TWO CAPSULES BY MOUTH AT BEDTIME FOR DISRUPTED SLEEP/VIVID DREAMS  05/28/21   [provider]  predniSONE (DELTASONE) 10 MG tablet Take 4 tablets (40 mg total) by mouth daily. 04/06/21   Fredia Sorrow, MD  saccharomyces boulardii (FLORASTOR) 250 MG capsule Take 250 mg by mouth 2 (two) times daily.     [provider]  thiamine 100 MG tablet Take 100 mg by mouth daily. 11/14/19   [provider]    Allergies Charentais melon (french melon), Cucumber extract, Other, Peanut butter flavor, Peanut oil, Shellfish allergy, Cantaloupe extract allergy skin test, Lactose, Strawberry extract, Vancomycin, Apple, Codeine, and Depakote er [divalproex sodium er]   REVIEW OF SYSTEMS  Negative except as noted here or in the History of Present Illness.   PHYSICAL EXAMINATION  Initial Vital Signs Blood pressure (!) 135/93, pulse 81, temperature 97.8 F (36.6 C), temperature source Oral, resp. rate 16, height 5' 5"  (1.651 m), weight 78.5 kg, SpO2 96 %.  Examination General: Well-developed, well-nourished male in no acute distress; appearance consistent with age of record HENT: normocephalic; atraumatic Eyes: Normal appearance Neck: supple Heart: regular rate and rhythm; systolic murmur loudest at the right upper sternal border Lungs: clear to auscultation bilaterally Abdomen: soft; nondistended; nontender; bowel sounds present Extremities: No deformity; full range of motion; laceration overlying the dorsal left middle finger, no functional deficit, no sensory deficit:    Neurologic: Awake, alert and oriented; motor function intact in all extremities and symmetric; no facial droop Skin: Warm and dry Psychiatric: Normal mood and affect   RESULTS  Summary of this visit's results, reviewed and interpreted by myself:   EKG Interpretation  Date/Time:    Ventricular Rate:    PR Interval:    QRS Duration:   QT Interval:    QTC Calculation:   R Axis:     Text Interpretation:         Laboratory Studies: No results found for this or  any previous visit (from the past 24 hour(s)). Imaging Studies: No results found.  ED COURSE and MDM  Nursing notes, initial and subsequent vitals signs, including pulse oximetry, reviewed and interpreted by myself.  Vitals:   06/06/21 0011 06/06/21 0012 06/06/21 0028  BP: (!) 135/93    Pulse: 81    Resp: 16    Temp: 97.8 F (36.6 C)    TempSrc: Oral    SpO2: 96%    Weight:  78.5 kg 78.5 kg  Height:  5' 5"  (1.651 m)    Medications  lidocaine-EPINEPHrine (XYLOCAINE W/EPI) 2 %-1:100000 (with pres) injection 20 mL (has no administration in time range)  lidocaine-EPINEPHrine (XYLOCAINE W/EPI) 2 %-1:200000 (PF) injection (20 mLs  Given by  Other 06/06/21 0132)   Wound closed as described below.  Patient advised to have sutures out in about 7 to 10 days.    PROCEDURES  Procedures LACERATION REPAIR Performed by: Karen Chafe Lorain Keast Authorized by: Karen Chafe Aylen Rambert Consent: Verbal consent obtained. Risks and benefits: risks, benefits and alternatives were discussed Consent given by: patient Patient identity confirmed: provided demographic data Prepped and Draped in normal sterile fashion Wound explored  Laceration Location: Left middle finger  Laceration Length: 1 cm  No Foreign Bodies seen or palpated  Anesthesia: local infiltration  Local anesthetic: lidocaine 2% with epinephrine  Anesthetic total: 1 ml  Irrigation method: syringe Amount of cleaning: standard  Skin closure: 4-0 Prolene  Number of sutures: 2  Technique: Simple interrupted  Patient tolerance: Patient tolerated the procedure well with no immediate complications.   ED DIAGNOSES     ICD-10-CM   1. Laceration of left middle finger without foreign body without damage to nail, initial encounter  J15.953X          Shanon Rosser, MD 06/06/21 [phone]

## 2021-06-11 ENCOUNTER — Ambulatory Visit: Payer: 59 | Admitting: Sports Medicine

## 2021-06-15 ENCOUNTER — Ambulatory Visit (INDEPENDENT_AMBULATORY_CARE_PROVIDER_SITE_OTHER): Payer: 59 | Admitting: Physical Therapy

## 2021-06-15 ENCOUNTER — Other Ambulatory Visit: Payer: Self-pay

## 2021-06-15 DIAGNOSIS — R262 Difficulty in walking, not elsewhere classified: Secondary | ICD-10-CM

## 2021-06-15 DIAGNOSIS — R260 Ataxic gait: Secondary | ICD-10-CM

## 2021-06-15 DIAGNOSIS — M6281 Muscle weakness (generalized): Secondary | ICD-10-CM | POA: Diagnosis not present

## 2021-06-15 DIAGNOSIS — R2689 Other abnormalities of gait and mobility: Secondary | ICD-10-CM | POA: Diagnosis not present

## 2021-06-15 DIAGNOSIS — R293 Abnormal posture: Secondary | ICD-10-CM | POA: Diagnosis not present

## 2021-06-15 NOTE — Therapy (Signed)
[place], [place], [address] Phone: [phone]   Fax:  [phone]  Physical Therapy Evaluation  Patient Details  Name: Juan [name] MRN: [id number] Date of Birth: 10-31-1965 (55) Referring Provider (PT): [name], DO   Encounter Date: 06/15/2021   PT End of Session - 06/15/21 1027     Visit Number 1    Number of Visits 12    Date for PT Re-Evaluation 07/27/21    Authorization Type [place] [employer]    PT Start Time 1020    PT Stop Time 1100    PT Time Calculation (min) 40 min    Equipment Utilized During Treatment Gait belt    Activity Tolerance Patient tolerated treatment well;Patient limited by fatigue    Behavior During Therapy [facility] for tasks assessed/performed             Past Medical History:  Diagnosis Date   Alcohol addiction ([place])    Anxiety    Chronic fatigue    Colon polyps    Depression    GERD (gastroesophageal reflux disease)    Hemochromatosis    Hx of blood clots    Leg   Hyperreflexia    Hypertension    PTSD (post-traumatic stress disorder)    Traumatic hemorrhagic shock ([place])    Ulcerative colitis ([place])     Past Surgical History:  Procedure Laterality Date   CARPAL TUNNEL RELEASE Bilateral    FRACTURE SURGERY     left ankle plate   HERNIA REPAIR     inguinal   KNEE SURGERY Right    x 4   SHOULDER SURGERY Bilateral    x 2   VASECTOMY      There were no vitals filed for this visit.    Subjective Assessment - 06/15/21 1023     Subjective Pt states his muscles have been getting weaker. [facility] but insurance stopped it. Pt notes that late in the day (2-3 pm) it gets really bad. Pt notes he is on disability and was diagnosed with muscular dystrophy. Pt states that this Thursday he's going to [facility] to get EMG to get a second opinion. Pt notes he's doing his HEP from home health daily.     Limitations Walking;House hold activities;Standing    How long can you stand comfortably? ~10 minutes    How long can you walk comfortably? With assistance (w/c, cane, or rollator) can get around grocery store    Patient Stated Goals Getting strength back up and maintain    Currently in Pain? No/denies                [facility] PT Assessment - 06/15/21 0001       Assessment   Medical Diagnosis R26.9 (ICD-10-CM) - Gait abnormality  R29.6 (ICD-10-CM) - Frequent falls    Referring Provider (PT) [name], DO    Prior Therapy HHPT      Precautions   Precautions Fall      Restrictions   Weight Bearing Restrictions No      Balance Screen   Has the patient fallen in the past 6 months Yes    How many times? 6   Last fall 4 days ago   Has the patient had a decrease in activity level because of a fear of falling?  Yes    Is the patient reluctant to leave their home because  of a fear of falling?  Yes      [place] residence    Living Arrangements Spouse/significant other    Available Help at [place] to enter    Entrance Stairs-Number of Steps 4    [place] One level    Home Equipment Wheelchair - manual;Cane - single point;Walker - 4 wheels;Grab bars - tub/shower;Shower seat    Additional Comments 40 yards to get to mailbox down an incline; thinking about getting a townhome      Prior Function   Level of Independence Independent    Vocation Retired      Observation/Other Assessments   Focus on Therapeutic Outcomes (FOTO)  n/a      Posture/Postural Control   Posture/Postural Control Postural limitations    Postural Limitations Rounded Shoulders;Flexed trunk      ROM / Strength   AROM / PROM / Strength Strength      Strength   Strength Assessment Site Hip;Knee;Ankle    Right/Left Hip Right;Left    Right Hip Flexion 4/5    Right Hip Extension 3+/5    Right Hip  ABduction 3+/5    Left Hip Flexion 4-/5    Left Hip Extension 3/5    Left Hip ABduction 3/5    Right/Left Knee Right;Left    Right Knee Flexion 4+/5    Right Knee Extension 4+/5    Left Knee Flexion 4+/5    Left Knee Extension 4+/5    Right/Left Ankle Right;Left    Right Ankle Dorsiflexion 4/5    Left Ankle Dorsiflexion 3+/5   decreased range from prior injury     Transfers   Five time sit to stand comments  26 sec      Ambulation/Gait   Ambulation Distance (Feet) 100 Feet    Assistive device Rollator    Gait Pattern Step-through pattern;Decreased dorsiflexion - left;Decreased weight shift to left;Left foot flat;Shuffle;Ataxic;Wide base of support    Ambulation Surface Level;Indoor      Standardized Balance Assessment   Standardized Balance Assessment Berg Balance Test      Berg Balance Test   Sit to Stand Able to stand  independently using hands    Standing Unsupported Able to stand 2 minutes with supervision    Sitting with Back Unsupported but Feet Supported on Floor or Stool Able to sit safely and securely 2 minutes    Stand to Sit Controls descent by using hands    Transfers Able to transfer safely, definite need of hands    Standing Unsupported with Eyes Closed Able to stand 10 seconds with supervision    Standing Unsupported with Feet Together Needs help to attain position but able to stand for 30 seconds with feet together    From Standing, Reach Forward with Outstretched Arm Reaches forward but needs supervision    From Standing Position, Pick up Object from [place] to pick up shoe safely and easily    From Standing Position, Turn to Look Behind Over each Shoulder Looks behind from both sides and weight shifts well    Turn 360 Degrees Needs close supervision or verbal cueing   worse towards left   Standing Unsupported, Alternately Place Feet on Step/Stool Able to complete 4 steps without aid or supervision    Standing Unsupported, One Foot in Front Able to take small  step independently and hold 30 seconds    Standing  on One Leg Tries to lift leg/unable to hold 3 seconds but remains standing independently    Total Score 35    Berg comment: 35/56; <45/56 indicates high fall risk                        Objective measurements completed on examination: See above findings.                     PT Long Term Goals - 06/15/21 1345       PT LONG TERM GOAL #1   Title Pt will be independent with HEP to improve posture and strength    Time 6    Period Weeks    Status New    Target Date 07/27/21      PT LONG TERM GOAL #2   Title Pt will be able to improve Berg Balance Score to at least 45/56 to demo decreased risk with falls    Baseline 35/56    Time 6    Period Weeks    Status New    Target Date 07/27/21      PT LONG TERM GOAL #3   Title Pt will have improved 5x STS to </=20 sec to demo improved bilat LE functional strength    Time 6    Period Weeks    Status New    Target Date 07/27/21      PT LONG TERM GOAL #4   Title Pt will be able to tolerate ambulating at least 1000' with LRAD for improved home and limited community mobility    Time 6    Period Weeks    Status New    Target Date 07/27/21                    Plan - 06/15/21 1327     Clinical Impression Statement Mr. Cassin is a 55 y/o M presenting to OPPT for deconditioning. Pt has been previously diagnosed with muscular dystrophy from the [place]. Pt is to get new EMG exam and second opinion from [facility]. Pt previously had HHPT months ago. L foot drop at baseline from injuring his leg. On assessment, pt demos decreased activity tolerance and endurance, ataxic limbs, general weakness (L LE worse than R LE), all leading to decreased balance and safety. Pt's Berg Balance score of 35/56 and 5x STS place him at a high fall risk. Pt would benefit from OPPT to work on these deficits to increase his mobility for ADLs and safe amb.    Personal Factors and  Comorbidities Age;Sex;Social Background;Time since onset of injury/illness/exacerbation;Past/Current Experience    Examination-Activity Limitations Transfers;Squat;Stairs;Locomotion Level;Lift;Carry;Stand;Hygiene/Grooming    Examination-Participation Restrictions Cleaning;Yard Work;Community Activity;Shop    Stability/Clinical Decision Making Evolving/Moderate complexity    Clinical Decision Making Moderate    Rehab Potential Fair    PT Frequency 2x / week    PT Duration 6 weeks    PT Treatment/Interventions ADLs/Self Care Home Management;Aquatic Therapy;Moist Heat;Electrical Stimulation;Iontophoresis 50m/ml Dexamethasone;Cryotherapy;DME Instruction;Gait training;Stair training;Therapeutic activities;Therapeutic exercise;Balance training;Neuromuscular re-education;Manual techniques;Patient/family education;Dry needling;Taping;Energy conservation    PT Next Visit Plan Initiate balance exercise. Work on iso and eccentric strengthening. Work on mVisual merchandiser    PT Home Exercise Plan Not yet issued    Consulted and Agree with Plan of Care Patient             Patient will benefit from skilled therapeutic intervention in order to improve the following deficits and impairments:  Abnormal gait, Decreased balance, Decreased endurance, Decreased mobility, Difficulty walking, Impaired tone, Improper body mechanics, Decreased activity tolerance, Decreased coordination, Decreased strength, Impaired UE functional use, Postural dysfunction  Visit Diagnosis: Muscle weakness (generalized)  Difficulty in walking, not elsewhere classified  Abnormal posture  Other abnormalities of gait and mobility  Ataxic gait     Problem List Patient Active Problem List   Diagnosis Date Noted   Tinea pedis 05/12/2021   Diverticulosis 04/20/2021   Right lower quadrant pain 04/17/2021   Tibialis posterior tendinitis, right 04/14/2021   Relationship problems 03/23/2021   Dizziness 03/23/2021   Urinary  frequency 03/23/2021   Right ankle sprain 02/12/2021   Muscular dystrophy ([place]) 82/80/0349   Systolic murmur 17/91/5056   Hypocalcemia 11/02/2020   Thrombocytopenia ([place]) 11/02/2020   COVID-19 virus infection 08/13/2020   Primary osteoarthritis of first carpometacarpal joint of one hand, left 07/11/2020   Allergic reaction 07/08/2020   History of alcohol abuse 05/20/2020   Impingement syndrome, shoulder, right 05/20/2020   Fracture of laryngeal cartilage ([place]) 01/17/2020   Esophagitis, [place] grade D 12/05/2019   Chronic alcoholic liver disease ([place]) 09/30/2019   GAD (generalized anxiety disorder) 04/16/2019   Chronic post-traumatic stress disorder (PTSD) 04/16/2019   MDD (major depressive disorder), recurrent severe, without psychosis ([place]) 04/15/2019   History of bilateral inguinal hernia repair 01/19/2019   Myopathy 11/18/2018   Laceration of extensor hallucis longus tendon, left, initial encounter 02/06/2017   Elevated liver enzymes 01/07/2017   Chronic fatigue 12/21/2016   Benign essential hypertension 12/21/2016    [facility] April Gordy Levan, PT, DPT 06/15/2021, 1:51 PM  [facility] [place] [address] [place], [place], [address] Phone: [phone]   Fax:  [phone]  Name: Bowdy Bair MRN: [id number] Date of Birth: Feb 25, 1966

## 2021-06-15 NOTE — Patient Instructions (Signed)
Aquatic Therapy: What to Expect! ° °Where:  °[facility] [address] °[address] °[place], [place] [address] °[phone]          ° °How to Prepare: ° °If you require assistance with dressing, with transportation (ie: wheel chair), or toileting, a caregiver must attend the entire session with you (unless your primary therapists feels this is not necessary).   °If there is thunder during your appointment, you will be asked to leave pool area. You have the option to finish your session in the physical therapy area near the gym. °Masks in the pool area are optional. Your face will remain dry during your session, so you are welcome to keep your mask on, if desired. You will be spaced at least 6 feet from other aquatic patients.  °Please bring your own swim towel to dry off with.   °There are Men's and Women's locker rooms with showers, as well as gender neutral bathrooms in the pool area.  °Please arrive IN YOUR SUIT and a few minutes prior to your appointment - this helps to avoid delays in starting your session. °Head to the pool and await your appointment on the bench on the pool deck. °Please make sure to attend to any toileting needs prior to entering the pool. °Once on the pool deck your therapist will ask you to sign the Patient  Consent and Assignment of Benefits form. °Your therapist may take your blood pressure prior to, during and after your session if indicated. °We usually try and create a home exercise program based on activities we do in the pool. Some patients do not want to or do not have the ability to participate in an aquatic home program - this is not a barrier in any way to you participating in aquatic therapy as part of your current therapy plan! ° °Appointments:  All sessions are 45 minutes ° °About the pool: °Entering the pool: °Your therapist will assist you if needed; there are two ways to enter the pool - stairs or a mechanical lift. Your therapist will determine  the most appropriate way for you. °Water temperature is usually around 91-95°.  There is a lap pool with a temperature around 84° °There may be other therapists and patients in the pool at the same time.  ° °Contact Info:            °To cancel appointment, please call [facility] at [phone] °If you are running late, please call [facility] at [phone]    °        °     ° ° °

## 2021-06-18 ENCOUNTER — Ambulatory Visit: Payer: 59 | Admitting: Family Medicine

## 2021-06-18 ENCOUNTER — Encounter: Payer: Self-pay | Admitting: Family Medicine

## 2021-06-18 VITALS — BP 132/79 | HR 93 | Temp 98.5°F | Wt 171.1 lb

## 2021-06-18 DIAGNOSIS — Z4802 Encounter for removal of sutures: Secondary | ICD-10-CM

## 2021-06-18 NOTE — Progress Notes (Signed)
Acute Office Visit  Subjective:    Patient ID: Juan [name], male    DOB: 1966-07-23, 55 y.o.   MRN: [id number]  Chief Complaint  Patient presents with   Suture / Staple Removal    HPI 06/06/21 ED - Pt with laceration to L middle finger due to his wheelchair. 3 sutures placed  Today patient presents for suture removal. Denies any inflammation, drainage, spreading erythema, worsening pain, fevers, etc. Has been keeping clean, dry, and using neosporin + bandaid. No complaints or concerns.     Past Medical History:  Diagnosis Date   Alcohol addiction ([place])    Anxiety    Chronic fatigue    Colon polyps    Depression    GERD (gastroesophageal reflux disease)    Hemochromatosis    Hx of blood clots    Leg   Hyperreflexia    Hypertension    PTSD (post-traumatic stress disorder)    Traumatic hemorrhagic shock ([facility])    Ulcerative colitis ([place])     Past Surgical History:  Procedure Laterality Date   CARPAL TUNNEL RELEASE Bilateral    FRACTURE SURGERY     left ankle plate   HERNIA REPAIR     inguinal   KNEE SURGERY Right    x 4   SHOULDER SURGERY Bilateral    x 2   VASECTOMY      Family History  Problem Relation Age of Onset   Pulmonary fibrosis Mother    Other Father        liver failure   Breast cancer Paternal Aunt     Social History   Socioeconomic History   Marital status: Married    Spouse name: [name]   Number of children: 2   Years of education: Not on file   Highest education level: High school graduate  Occupational History    Comment: disability  Tobacco Use   Smoking status: Never   Smokeless tobacco: Current    Types: Snuff  Vaping Use   Vaping Use: Never used  Substance and Sexual Activity   Alcohol use: Not Currently   Drug use: Never   Sexual activity: Not on file  Other Topics Concern   Not on file  Social History Narrative   Lives with wife   Social Determinants of Health   Financial Resource Strain: Not on file  Food  Insecurity: Not on file  Transportation Needs: Not on file  Physical Activity: Not on file  Stress: Not on file  Social Connections: Not on file  Intimate Partner Violence: Not on file    Outpatient Medications Prior to Visit  Medication Sig Dispense Refill   amLODipine (NORVASC) 2.5 MG tablet Take 1 tablet (2.5 mg total) by mouth daily. 90 tablet 1   aspirin 81 MG chewable tablet Chew 81 mg by mouth daily.     B Complex Vitamins (B COMPLEX PO) Take by mouth daily.     busPIRone (BUSPAR) 10 MG tablet TAKE 2 TABLETS BY MOUTH THREE TIMES DAILY 540 tablet 1   Cholecalciferol 50 MCG (2000 UT) CAPS Take 1 capsule by mouth daily.     clotrimazole-betamethasone (LOTRISONE) cream Apply 1 application topically 2 (two) times daily. 45 g 0   DULoxetine (CYMBALTA) 20 MG capsule Take 1 capsule (20 mg total) by mouth daily. 30 capsule 3   EPINEPHrine 0.3 mg/0.3 mL IJ SOAJ injection INJECT 0.3 MG INTO THE MUSCLE AS NEEDED FOR ANAPHYLAXIS. 2 each 0   escitalopram (LEXAPRO) 20 MG tablet  Take 20 mg by mouth daily.     famotidine (PEPCID) 20 MG tablet Take 1 tablet (20 mg total) by mouth 2 (two) times daily. 30 tablet 0   HYDROcodone-acetaminophen (NORCO/VICODIN) 5-325 MG tablet Take 1-2 tablets by mouth every 4-6 hours as needed for pain for 5 days. NOT TO EXCEED 10 TABLETS A DAY. 35 tablet 0   Magnesium Oxide 400 (240 Mg) MG TABS Take 240 mg by mouth.     mirtazapine (REMERON) 15 MG tablet Take 15 mg by mouth at bedtime.     Multiple Vitamins-Minerals (YOUR LIFE MULTI ADULT GUMMIES) CHEW Chew 1 tablet by mouth daily.     nicotine (NICODERM CQ - DOSED IN MG/24 HR) 7 mg/24hr patch APPLY 1 PATCH TO SKIN ONCE A DAY *APPLY TO NON-HAIRY, CLEAN, DRY AREA (NO TOBACCO PRODUCTS)* FOR 2 WEEKS - BEGIN AFTER 14MG PATCHES COMPLETE     pantoprazole (PROTONIX) 40 MG tablet TAKE 1 TABLET (40 MG TOTAL) BY MOUTH DAILY. 90 tablet 3   potassium chloride (KLOR-CON) 10 MEQ tablet Take 1 tablet (10 mEq total) by mouth daily. 90  tablet 0   prazosin (MINIPRESS) 2 MG capsule TAKE TWO CAPSULES BY MOUTH AT BEDTIME FOR DISRUPTED SLEEP/VIVID DREAMS     saccharomyces boulardii (FLORASTOR) 250 MG capsule Take 250 mg by mouth 2 (two) times daily.      thiamine 100 MG tablet Take 100 mg by mouth daily.     No facility-administered medications prior to visit.    Allergies  Allergen Reactions   Charentais Melon (French Melon) Anaphylaxis   Cucumber Extract Itching and Nausea And Vomiting    No extracts; just cucumber    Other Anaphylaxis   Peanut Butter Flavor Anaphylaxis and Swelling   Peanut Oil Swelling   Shellfish Allergy Itching and Swelling   Cantaloupe Extract Allergy Skin Test Rash   Lactose Other (See Comments)   Strawberry Extract Nausea And Vomiting and Swelling   Vancomycin Rash   Apple Swelling   Codeine     itching   Depakote Er [Divalproex Sodium Er]     Tongue swelling    Review of Systems All review of systems negative except what is listed in the HPI     Objective:    Physical Exam Vitals reviewed.  Constitutional:      Appearance: Normal appearance.  Skin:    General: Skin is warm and dry.     Comments: Left middle 3 sutures in place, no significant erythema, no edema, induration, drainage; mildly tender  Neurological:     Mental Status: He is alert and oriented to person, place, and time.  Psychiatric:        Mood and Affect: Mood normal.        Behavior: Behavior normal.        Thought Content: Thought content normal.        Judgment: Judgment normal.    BP 132/79 (BP Location: Left Arm, Patient Position: Sitting, Cuff Size: Normal)   Pulse 93   Temp 98.5 F (36.9 C) (Oral)   Wt 171 lb 1.3 oz (77.6 kg)   SpO2 95%   BMI 28.47 kg/m  Wt Readings from Last 3 Encounters:  06/18/21 171 lb 1.3 oz (77.6 kg)  06/06/21 173 lb (78.5 kg)  06/02/21 173 lb (78.5 kg)    There are no preventive care reminders to display for this patient.   There are no preventive care reminders to  display for this patient.   No  results found for: TSH Lab Results  Component Value Date   WBC 9.9 04/17/2021   HGB 13.7 04/17/2021   HCT 40.4 04/17/2021   MCV 96.9 04/17/2021   PLT 65 (L) 04/17/2021   Lab Results  Component Value Date   NA 134 (L) 04/17/2021   K 3.2 (L) 04/17/2021   CO2 24 04/17/2021   GLUCOSE 89 04/17/2021   BUN 12 04/17/2021   CREATININE 0.88 04/17/2021   BILITOT 3.3 (H) 04/17/2021   ALKPHOS 82 04/06/2021   AST 73 (H) 04/17/2021   ALT 24 04/17/2021   PROT 7.4 04/17/2021   ALBUMIN 3.7 04/06/2021   CALCIUM 8.9 04/17/2021   ANIONGAP 16 (H) 04/06/2021   EGFR 103 04/17/2021   Lab Results  Component Value Date   CHOL 200 (H) 01/06/2021   Lab Results  Component Value Date   HDL 41 01/06/2021   Lab Results  Component Value Date   LDLCALC 129 (H) 01/06/2021   Lab Results  Component Value Date   TRIG 182 (H) 01/06/2021   Lab Results  Component Value Date   CHOLHDL 4.9 01/06/2021   Lab Results  Component Value Date   HGBA1C 5.1 04/08/2021       Assessment & Plan:   1. Encounter for removal of sutures Area was cleaned. Edges approximated. No signs of infection. Sutures removed easily. Patient tolerated well. Neosporin and bandaid applied. Patient aware of signs/symptoms requiring further/urgent evaluation.  Follow-up as needed.    [facility] Olevia Bowens, DNP, FNP-C

## 2021-06-21 ENCOUNTER — Encounter (INDEPENDENT_AMBULATORY_CARE_PROVIDER_SITE_OTHER): Payer: 59 | Admitting: Family Medicine

## 2021-06-21 DIAGNOSIS — R5382 Chronic fatigue, unspecified: Secondary | ICD-10-CM

## 2021-06-22 ENCOUNTER — Other Ambulatory Visit: Payer: Self-pay

## 2021-06-22 ENCOUNTER — Ambulatory Visit: Payer: 59 | Admitting: Physical Therapy

## 2021-06-22 ENCOUNTER — Other Ambulatory Visit (HOSPITAL_BASED_OUTPATIENT_CLINIC_OR_DEPARTMENT_OTHER): Payer: Self-pay

## 2021-06-22 DIAGNOSIS — R2689 Other abnormalities of gait and mobility: Secondary | ICD-10-CM | POA: Diagnosis not present

## 2021-06-22 DIAGNOSIS — M6281 Muscle weakness (generalized): Secondary | ICD-10-CM | POA: Diagnosis not present

## 2021-06-22 DIAGNOSIS — R262 Difficulty in walking, not elsewhere classified: Secondary | ICD-10-CM

## 2021-06-22 DIAGNOSIS — R260 Ataxic gait: Secondary | ICD-10-CM

## 2021-06-22 DIAGNOSIS — R293 Abnormal posture: Secondary | ICD-10-CM

## 2021-06-22 MED ORDER — ESCITALOPRAM OXALATE 20 MG PO TABS
ORAL_TABLET | ORAL | 0 refills | Status: DC
  Filled 2021-06-22: qty 9, 27d supply, fill #0

## 2021-06-22 MED ORDER — PANTOPRAZOLE SODIUM 40 MG PO TBEC
DELAYED_RELEASE_TABLET | Freq: Every day | ORAL | 3 refills | Status: DC
  Filled 2021-06-22: qty 90, 90d supply, fill #0

## 2021-06-22 MED ORDER — FOLIC ACID 1 MG PO TABS
ORAL_TABLET | Freq: Every day | ORAL | 3 refills | Status: AC
  Filled 2021-06-22: qty 90, 90d supply, fill #0
  Filled 2022-01-12 – 2022-01-22 (×2): qty 90, 90d supply, fill #1
  Filled 2022-04-16: qty 90, 90d supply, fill #2

## 2021-06-22 NOTE — Therapy (Signed)
[place], [place], [address] Phone: [phone]   Fax:  [phone]  Physical Therapy Treatment  Patient Details  Name: Juan [name] MRN: [id number] Date of Birth: May 17, 1966 Referring Provider (PT): [name], DO   Encounter Date: 06/22/2021   PT End of Session - 06/22/21 1020     Visit Number 2    Number of Visits 12    Date for PT Re-Evaluation 07/27/21    Authorization Type [place] [employer]    PT Start Time 1020    PT Stop Time 1100    PT Time Calculation (min) 40 min    Equipment Utilized During Treatment Gait belt    Activity Tolerance Patient tolerated treatment well;Patient limited by fatigue    Behavior During Therapy [facility] for tasks assessed/performed             Past Medical History:  Diagnosis Date   Alcohol addiction ([place])    Anxiety    Chronic fatigue    Colon polyps    Depression    GERD (gastroesophageal reflux disease)    Hemochromatosis    Hx of blood clots    Leg   Hyperreflexia    Hypertension    PTSD (post-traumatic stress disorder)    Traumatic hemorrhagic shock ([place])    Ulcerative colitis ([place])     Past Surgical History:  Procedure Laterality Date   CARPAL TUNNEL RELEASE Bilateral    FRACTURE SURGERY     left ankle plate   HERNIA REPAIR     inguinal   KNEE SURGERY Right    x 4   SHOULDER SURGERY Bilateral    x 2   VASECTOMY      There were no vitals filed for this visit.   Subjective Assessment - 06/22/21 1023     Subjective Pt states he saw his MD and was told that his EMG looks worse. Pt notes he fell last night while holding on to milk, wearing socks on his hardwood floor, and trying to step over a cat.    Limitations Walking;House hold activities;Standing    How long can you stand comfortably? ~10 minutes    How long can you walk comfortably? With assistance (w/c, cane, or rollator) can get around grocery store    Patient Stated  Goals Getting strength back up and maintain    Currently in Pain? No/denies                               [facility] Adult PT Treatment/Exercise - 06/22/21 0001       Exercises   Exercises Knee/Hip      Knee/Hip Exercises: Stretches   Passive Hamstring Stretch 20 seconds;Right;Left      Knee/Hip Exercises: Aerobic   Nustep L6 x 5 min LEs only      Knee/Hip Exercises: Standing   Heel Raises Both;2 sets;10 reps      Knee/Hip Exercises: Seated   Other Seated Knee/Hip Exercises seated pball ab set 10x3 sec hold; hip flexion iso against pball 10x3 sec      Knee/Hip Exercises: Supine   Bridges Strengthening;2 sets;10 reps      Knee/Hip Exercises: Sidelying   Clams 2x10                 Balance Exercises - 06/22/21 0001       Balance Exercises: Standing   Standing Eyes Opened Narrow base  of support (BOS);Head turns;Solid surface   2x10 head turns & head nods   SLS with Vectors Solid surface;Intermittent upper extremity assist   x10               PT Education - 06/22/21 1101     Education Details Discussed HEP and progressing strengthening.    Person(s) Educated Patient    Methods Explanation;Verbal cues;Handout    Comprehension Verbalized understanding;Returned demonstration                 PT Long Term Goals - 06/15/21 1345       PT LONG TERM GOAL #1   Title Pt will be independent with HEP to improve posture and strength    Time 6    Period Weeks    Status New    Target Date 07/27/21      PT LONG TERM GOAL #2   Title Pt will be able to improve Berg Balance Score to at least 45/56 to demo decreased risk with falls    Baseline 35/56    Time 6    Period Weeks    Status New    Target Date 07/27/21      PT LONG TERM GOAL #3   Title Pt will have improved 5x STS to </=20 sec to demo improved bilat LE functional strength    Time 6    Period Weeks    Status New    Target Date 07/27/21      PT LONG TERM GOAL #4   Title Pt  will be able to tolerate ambulating at least 1000' with LRAD for improved home and limited community mobility    Time 6    Period Weeks    Status New    Target Date 07/27/21                   Plan - 06/22/21 1027     Clinical Impression Statement Treatment focused on initiating core and hip strengthening. Began balance exercises. Pt tolerated well. Remains mildly ataxic with movements and fatigues easily.    Personal Factors and Comorbidities Age;Sex;Social Background;Time since onset of injury/illness/exacerbation;Past/Current Experience    Examination-Activity Limitations Transfers;Squat;Stairs;Locomotion Level;Lift;Carry;Stand;Hygiene/Grooming    Examination-Participation Restrictions Cleaning;Yard Work;Community Activity;Shop    Stability/Clinical Decision Making Evolving/Moderate complexity    Rehab Potential Fair    PT Frequency 2x / week    PT Duration 6 weeks    PT Treatment/Interventions ADLs/Self Care Home Management;Aquatic Therapy;Moist Heat;Electrical Stimulation;Iontophoresis 39m/ml Dexamethasone;Cryotherapy;DME Instruction;Gait training;Stair training;Therapeutic activities;Therapeutic exercise;Balance training;Neuromuscular re-education;Manual techniques;Patient/family education;Dry needling;Taping;Energy conservation    PT Next Visit Plan Continue to progress strengthening, motor control, and balance.    PT Home Exercise Plan Access Code [email or web address]    Consulted and Agree with Plan of Care Patient             Patient will benefit from skilled therapeutic intervention in order to improve the following deficits and impairments:  Abnormal gait, Decreased balance, Decreased endurance, Decreased mobility, Difficulty walking, Impaired tone, Improper body mechanics, Decreased activity tolerance, Decreased coordination, Decreased strength, Impaired UE functional use, Postural dysfunction  Visit Diagnosis: Muscle weakness (generalized)  Difficulty in walking, not  elsewhere classified  Abnormal posture  Other abnormalities of gait and mobility  Ataxic gait     Problem List Patient Active Problem List   Diagnosis Date Noted   Tinea pedis 05/12/2021   Diverticulosis 04/20/2021   Right lower quadrant pain 04/17/2021   Tibialis posterior tendinitis, right 04/14/2021  Relationship problems 03/23/2021   Dizziness 03/23/2021   Urinary frequency 03/23/2021   Right ankle sprain 02/12/2021   Muscular dystrophy ([place]) 07/46/0029   Systolic murmur 84/73/0856   Hypocalcemia 11/02/2020   Thrombocytopenia ([place]) 11/02/2020   COVID-19 virus infection 08/13/2020   Primary osteoarthritis of first carpometacarpal joint of one hand, left 07/11/2020   Allergic reaction 07/08/2020   History of alcohol abuse 05/20/2020   Impingement syndrome, shoulder, right 05/20/2020   Fracture of laryngeal cartilage ([place]) 01/17/2020   Esophagitis, [place] grade D 12/05/2019   Chronic alcoholic liver disease ([place]) 09/30/2019   GAD (generalized anxiety disorder) 04/16/2019   Chronic post-traumatic stress disorder (PTSD) 04/16/2019   MDD (major depressive disorder), recurrent severe, without psychosis ([place]) 04/15/2019   History of bilateral inguinal hernia repair 01/19/2019   Myopathy 11/18/2018   Laceration of extensor hallucis longus tendon, left, initial encounter 02/06/2017   Elevated liver enzymes 01/07/2017   Chronic fatigue 12/21/2016   Benign essential hypertension 12/21/2016    [facility] April Gordy Levan, PT, DPT 06/22/2021, 11:01 AM  [facility] [place] [address] [place], [place], [address] Phone: [phone]   Fax:  [phone]  Name: Juan [name] MRN: [id number] Date of Birth: 08/25/1965

## 2021-06-22 NOTE — Patient Instructions (Signed)
Access Code: [email or web address] URL: [email or web address] Date: 06/22/2021 Prepared by: Estill Bamberg April Thurnell Garbe  Exercises Seated Abdominal Press into [employer] - 1 x daily - 7 x weekly - 2 sets - 10 reps Supine Bridge - 1 x daily - 7 x weekly - 2 sets - 10 reps Clamshell - 1 x daily - 7 x weekly - 2 sets - 10 reps Romberg Stance with Head Nods - 1 x daily - 7 x weekly - 1 sets - 10 reps Romberg Stance with Head Rotation - 1 x daily - 7 x weekly - 1 sets - 10 reps

## 2021-06-22 NOTE — Telephone Encounter (Signed)
I spent 5 total minutes of online digital evaluation and management services. 

## 2021-06-25 ENCOUNTER — Encounter: Payer: 59 | Admitting: Physical Therapy

## 2021-06-26 ENCOUNTER — Encounter: Payer: 59 | Admitting: Physical Therapy

## 2021-06-29 ENCOUNTER — Encounter: Payer: 59 | Admitting: Physical Therapy

## 2021-06-29 DIAGNOSIS — B309 Viral conjunctivitis, unspecified: Secondary | ICD-10-CM | POA: Diagnosis not present

## 2021-07-02 ENCOUNTER — Ambulatory Visit: Payer: 59 | Admitting: Physical Therapy

## 2021-07-02 ENCOUNTER — Other Ambulatory Visit: Payer: Self-pay

## 2021-07-02 ENCOUNTER — Encounter: Payer: Self-pay | Admitting: Physical Therapy

## 2021-07-02 DIAGNOSIS — R2689 Other abnormalities of gait and mobility: Secondary | ICD-10-CM | POA: Diagnosis not present

## 2021-07-02 DIAGNOSIS — R293 Abnormal posture: Secondary | ICD-10-CM | POA: Diagnosis not present

## 2021-07-02 DIAGNOSIS — R262 Difficulty in walking, not elsewhere classified: Secondary | ICD-10-CM | POA: Diagnosis not present

## 2021-07-02 DIAGNOSIS — M6281 Muscle weakness (generalized): Secondary | ICD-10-CM | POA: Diagnosis not present

## 2021-07-02 NOTE — Therapy (Signed)
[place], [place], [address] Phone: [phone]   Fax:  [phone]  Physical Therapy Treatment  Patient Details  Name: Juan [name] MRN: [id number] Date of Birth: [birth year] Referring Provider (PT): [name], DO   Encounter Date: 07/02/2021   PT End of Session - 07/02/21 1019     Visit Number 3    Number of Visits 12    Date for PT Re-Evaluation 07/27/21    Authorization Type [facility] [place] [employer]    PT Start Time 1010    PT Stop Time 1055    PT Time Calculation (min) 45 min    Equipment Utilized During Treatment Gait belt    Activity Tolerance Patient tolerated treatment well;Patient limited by fatigue    Behavior During Therapy [facility] for tasks assessed/performed             Past Medical History:  Diagnosis Date   Alcohol addiction ([place])    Anxiety    Chronic fatigue    Colon polyps    Depression    GERD (gastroesophageal reflux disease)    Hemochromatosis    Hx of blood clots    Leg   Hyperreflexia    Hypertension    PTSD (post-traumatic stress disorder)    Traumatic hemorrhagic shock ([place])    Ulcerative colitis ([place])     Past Surgical History:  Procedure Laterality Date   CARPAL TUNNEL RELEASE Bilateral    FRACTURE SURGERY     left ankle plate   HERNIA REPAIR     inguinal   KNEE SURGERY Right    x 4   SHOULDER SURGERY Bilateral    x 2   VASECTOMY      There were no vitals filed for this visit.   Subjective Assessment - 07/02/21 1016     Subjective Pt's wife reports that the pt fell 4x yesterday.  Pt reports each of his falls where when he wasn't holding on to rollator.  He reports he fell and bumped his head on drywall, and put a dent in wall.    Limitations Walking;House hold activities;Standing    How long can you stand comfortably? ~10 minutes    How long can you walk comfortably? With assistance (w/c, cane, or rollator) can get around grocery store     Patient Stated Goals Getting strength back up and maintain    Currently in Pain? Yes    Pain Score 5     Pain Location Generalized                [facility] PT Assessment - 07/02/21 0001       Assessment   Medical Diagnosis R26.9 (ICD-10-CM) - Gait abnormality  R29.6 (ICD-10-CM) - Frequent falls    Referring Provider (PT) [name], DO    Prior Therapy [facility]                [facility] Adult PT Treatment/Exercise - 07/02/21 0001       Lumbar Exercises: Quadruped   Plank 1 rep, 10 sec    Other Quadruped Lumbar Exercises primal push up x 5 sec x 5 reps      Knee/Hip Exercises: Stretches   Passive Hamstring Stretch 20 seconds;Right;Left      Knee/Hip Exercises: Aerobic   Nustep L5-3 x 6 min LEs only      Knee/Hip Exercises: Seated   Other Seated Knee/Hip Exercises seated lap press (TA set) x 10 sec x 2  Sit to Sand 1 set;without UE support   eccentric lowering; to NuStep.     Knee/Hip Exercises: Supine   Bridges 1 set;10 reps    Other Supine Knee/Hip Exercises TA set x 10 sec x 5 reps, cues to not hold breath      Knee/Hip Exercises: Sidelying   Hip ABduction Strengthening;Right;Left;1 set;5 reps    Clams 5 reps each side             Balance Exercises - 07/02/21 0001       Balance Exercises: Standing   Standing Eyes Opened Narrow base of support (BOS);Head turns;1 rep;20 secs    Standing Eyes Closed Narrow base of support (BOS);Solid surface;2 reps;10 secs    Partial Tandem Stance Eyes open;1 rep;15 secs                     PT Long Term Goals - 06/15/21 1345       PT LONG TERM GOAL #1   Title Pt will be independent with HEP to improve posture and strength    Time 6    Period Weeks    Status New    Target Date 07/27/21      PT LONG TERM GOAL #2   Title Pt will be able to improve Berg Balance Score to at least 45/56 to demo decreased risk with falls    Baseline 35/56    Time 6    Period Weeks    Status New    Target Date 07/27/21       PT LONG TERM GOAL #3   Title Pt will have improved 5x STS to </=20 sec to demo improved bilat LE functional strength    Time 6    Period Weeks    Status New    Target Date 07/27/21      PT LONG TERM GOAL #4   Title Pt will be able to tolerate ambulating at least 1000' with LRAD for improved home and limited community mobility    Time 6    Period Weeks    Status New    Target Date 07/27/21                   Plan - 07/02/21 1152     Clinical Impression Statement Contiued ataxic movement with fatigue.  LLE fatiges quicker than RLE with both standing and supine exercises.. Pt given short rest breaks after 5-6 reps of exercises. Pt encouraged to take rest breaks at home while exercising to avoid overexertion and increased fall risk. Updated HEP.   Goals are ongoing.    Personal Factors and Comorbidities Age;Sex;Social Background;Time since onset of injury/illness/exacerbation;Past/Current Experience    Examination-Activity Limitations Transfers;Squat;Stairs;Locomotion Level;Lift;Carry;Stand;Hygiene/Grooming    Examination-Participation Restrictions Cleaning;Yard Work;Community Activity;Shop    Stability/Clinical Decision Making Evolving/Moderate complexity    Rehab Potential Fair    PT Frequency 2x / week    PT Duration 6 weeks    PT Treatment/Interventions ADLs/Self Care Home Management;Aquatic Therapy;Moist Heat;Electrical Stimulation;Iontophoresis 30m/ml Dexamethasone;Cryotherapy;DME Instruction;Gait training;Stair training;Therapeutic activities;Therapeutic exercise;Balance training;Neuromuscular re-education;Manual techniques;Patient/family education;Dry needling;Taping;Energy conservation    PT Next Visit Plan Continue to progress strengthening, motor control, and balance.    PT Home Exercise Plan Access Code [email or web address]    Consulted and Agree with Plan of Care Patient             Patient will benefit from skilled therapeutic intervention in order to improve the  following deficits and impairments:  Abnormal gait, Decreased balance,  Decreased endurance, Decreased mobility, Difficulty walking, Impaired tone, Improper body mechanics, Decreased activity tolerance, Decreased coordination, Decreased strength, Impaired UE functional use, Postural dysfunction  Visit Diagnosis: Muscle weakness (generalized)  Difficulty in walking, not elsewhere classified  Abnormal posture  Other abnormalities of gait and mobility     Problem List Patient Active Problem List   Diagnosis Date Noted   Tinea pedis 05/12/2021   Diverticulosis 04/20/2021   Right lower quadrant pain 04/17/2021   Tibialis posterior tendinitis, right 04/14/2021   Relationship problems 03/23/2021   Dizziness 03/23/2021   Urinary frequency 03/23/2021   Right ankle sprain 02/12/2021   Muscular dystrophy ([place]) 99/24/2683   Systolic murmur 41/96/2229   Hypocalcemia 11/02/2020   Thrombocytopenia ([place]) 11/02/2020   COVID-19 virus infection 08/13/2020   Primary osteoarthritis of first carpometacarpal joint of one hand, left 07/11/2020   Allergic reaction 07/08/2020   History of alcohol abuse 05/20/2020   Impingement syndrome, shoulder, right 05/20/2020   Fracture of laryngeal cartilage ([place]) 01/17/2020   Esophagitis, [place] grade D 12/05/2019   Chronic alcoholic liver disease ([place]) 09/30/2019   GAD (generalized anxiety disorder) 04/16/2019   Chronic post-traumatic stress disorder (PTSD) 04/16/2019   MDD (major depressive disorder), recurrent severe, without psychosis ([place]) 04/15/2019   History of bilateral inguinal hernia repair 01/19/2019   Myopathy 11/18/2018   Laceration of extensor hallucis longus tendon, left, initial encounter 02/06/2017   Elevated liver enzymes 01/07/2017   Chronic fatigue 12/21/2016   Benign essential hypertension 12/21/2016   Kerin Perna, PTA 07/02/21 12:04 PM  [place], [place], [address] Phone: [phone]   Fax:  [phone]  Name: Juan [name] MRN: [id number] Date of Birth: December 15, 1965

## 2021-07-02 NOTE — Patient Instructions (Signed)
Access Code: [email or web address] URL: [email or web address] Date: 07/02/2021 Prepared by: Adair  Exercises Supine Bridge - 1 x daily - 7 x weekly - 2 sets - 10 reps Clamshell - 1 x daily - 7 x weekly - 1 sets - 10 reps Romberg Stance with Head Nods - 1 x daily - 7 x weekly - 1 sets - 10 reps Romberg Stance with Head Rotation - 1 x daily - 7 x weekly - 1 sets - 10 reps Primal Push Up - 1 x daily - 3 x weekly - 1 sets - 3 reps - 5-10 seconds hold Seated Transversus Abdominis Bracing - 1 x daily - 7 x weekly - 3 sets - 5-10 reps - 10 seconds hold

## 2021-07-07 ENCOUNTER — Encounter: Payer: Self-pay | Admitting: Physical Therapy

## 2021-07-07 ENCOUNTER — Ambulatory Visit (INDEPENDENT_AMBULATORY_CARE_PROVIDER_SITE_OTHER): Payer: 59 | Admitting: Physical Therapy

## 2021-07-07 ENCOUNTER — Other Ambulatory Visit: Payer: Self-pay

## 2021-07-07 DIAGNOSIS — R2689 Other abnormalities of gait and mobility: Secondary | ICD-10-CM

## 2021-07-07 DIAGNOSIS — R293 Abnormal posture: Secondary | ICD-10-CM | POA: Diagnosis not present

## 2021-07-07 DIAGNOSIS — R260 Ataxic gait: Secondary | ICD-10-CM

## 2021-07-07 DIAGNOSIS — M6281 Muscle weakness (generalized): Secondary | ICD-10-CM

## 2021-07-07 DIAGNOSIS — R262 Difficulty in walking, not elsewhere classified: Secondary | ICD-10-CM | POA: Diagnosis not present

## 2021-07-07 NOTE — Therapy (Signed)
[place], [place], [address] Phone: [phone]   Fax:  [phone]  Physical Therapy Treatment  Patient Details  Name: Juan [name] MRN: [id number] Date of Birth: [birth year] Referring Provider (PT): [name], DO   Encounter Date: 07/07/2021   PT End of Session - 07/07/21 0936     Visit Number 4    Number of Visits 12    Date for PT Re-Evaluation 07/27/21    Authorization Type [place] [employer]    PT Start Time 0930    PT Stop Time 1015    PT Time Calculation (min) 45 min    Equipment Utilized During Treatment Gait belt    Activity Tolerance Patient tolerated treatment well;Patient limited by fatigue    Behavior During Therapy [facility] for tasks assessed/performed             Past Medical History:  Diagnosis Date   Alcohol addiction ([place])    Anxiety    Chronic fatigue    Colon polyps    Depression    GERD (gastroesophageal reflux disease)    Hemochromatosis    Hx of blood clots    Leg   Hyperreflexia    Hypertension    PTSD (post-traumatic stress disorder)    Traumatic hemorrhagic shock (HCC)    Ulcerative colitis ([place])     Past Surgical History:  Procedure Laterality Date   CARPAL TUNNEL RELEASE Bilateral    FRACTURE SURGERY     left ankle plate   HERNIA REPAIR     inguinal   KNEE SURGERY Right    x 4   SHOULDER SURGERY Bilateral    x 2   VASECTOMY      There were no vitals filed for this visit.   Subjective Assessment - 07/07/21 1150     Subjective Pt reports he has had no falls since last visit.  He reports that his back is sore from folding laundry.    How long can you walk comfortably? With assistance (w/c, cane, or rollator) can get around grocery store    Patient Stated Goals Getting strength back up and maintain    Currently in Pain? Yes    Pain Score 2     Pain Location Back    Pain Orientation Lower    Pain Descriptors / Indicators Aching                 [facility] PT Assessment - 07/07/21 0001       Assessment   Medical Diagnosis R26.9 (ICD-10-CM) - Gait abnormality  R29.6 (ICD-10-CM) - Frequent falls    Referring Provider (PT) [name], DO    Prior Therapy [facility]      Transfers   Five time sit to stand comments  17.58               [facility] Adult PT Treatment/Exercise - 07/07/21 0001       Exercises   Exercises Lumbar      Lumbar Exercises: Stretches   Lower Trunk Rotation 4 reps;10 seconds   with therapist assist     Lumbar Exercises: Quadruped   Single Arm Raises Limitations 1 each side    Opposite Arm/Leg Raise Limitations 1 rep each side - RLE tremulous.    Other Quadruped Lumbar Exercises primal push up x 5 sec x 4 reps      Knee/Hip Exercises: Stretches   Passive Hamstring Stretch Right;3 reps;Left;2 reps;20 seconds   therapist assist  Piriformis Stretch Right;Left;1 rep;20 seconds   therapist assist     Knee/Hip Exercises: Aerobic   Nustep L4: 4 min arms/legs for warm up.      Knee/Hip Exercises: Seated   Sit to Sand 1 set;5 reps;without UE support   for test.     Knee/Hip Exercises: Supine   Bridges Limitations bridge isometric, engaging core, scap squeeze, glute set, and gently pressing feet into mat without clearing uttocks from mat x 5 sec x 5 reps             Balance Exercises - 07/07/21 0001       Balance Exercises: Standing   Standing Eyes Closed Narrow base of support (BOS);Solid surface;2 reps;10 secs    Wall Bumps Eyes opened;Hip;10 reps    Partial Tandem Stance Eyes open;1 rep;15 secs    Sidestepping 2 reps   R/L at counter, intermittent UE.   Other Standing Exercises foot taps to 6" step without support x 4 each leg.                     PT Long Term Goals - 06/15/21 1345       PT LONG TERM GOAL #1   Title Pt will be independent with HEP to improve posture and strength    Time 6    Period Weeks    Status New    Target Date 07/27/21      PT LONG TERM GOAL  #2   Title Pt will be able to improve Berg Balance Score to at least 45/56 to demo decreased risk with falls    Baseline 35/56    Time 6    Period Weeks    Status New    Target Date 07/27/21      PT LONG TERM GOAL #3   Title Pt will have improved 5x STS to </=20 sec to demo improved bilat LE functional strength    Time 6    Period Weeks    Status New    Target Date 07/27/21      PT LONG TERM GOAL #4   Title Pt will be able to tolerate ambulating at least 1000' with LRAD for improved home and limited community mobility    Time 6    Period Weeks    Status New    Target Date 07/27/21                   Plan - 07/07/21 1256     Clinical Impression Statement STS improved time; has mtg LTG#3.  RLE tremulous with hookling position today.  Therapist assisted stretches today in supine.  He tolerated exercises well, and was given short seated rest breaks in between exercises to avoid overexertion.  Therapist walked pt to his car (with supervision) to ensure no falls due to fatigue at end of session.    Personal Factors and Comorbidities Age;Sex;Social Background;Time since onset of injury/illness/exacerbation;Past/Current Experience    Examination-Activity Limitations Transfers;Squat;Stairs;Locomotion Level;Lift;Carry;Stand;Hygiene/Grooming    Examination-Participation Restrictions Cleaning;Yard Work;Community Activity;Shop    Stability/Clinical Decision Making Evolving/Moderate complexity    Rehab Potential Fair    PT Frequency 2x / week    PT Duration 6 weeks    PT Treatment/Interventions ADLs/Self Care Home Management;Aquatic Therapy;Moist Heat;Electrical Stimulation;Iontophoresis 54m/ml Dexamethasone;Cryotherapy;DME Instruction;Gait training;Stair training;Therapeutic activities;Therapeutic exercise;Balance training;Neuromuscular re-education;Manual techniques;Patient/family education;Dry needling;Taping;Energy conservation    PT Next Visit Plan Continue to progress  strengthening, motor control, and balance.    PT Home Exercise Plan Access Code  [email or web address]    Consulted and Agree with Plan of Care Patient             Patient will benefit from skilled therapeutic intervention in order to improve the following deficits and impairments:  Abnormal gait, Decreased balance, Decreased endurance, Decreased mobility, Difficulty walking, Impaired tone, Improper body mechanics, Decreased activity tolerance, Decreased coordination, Decreased strength, Impaired UE functional use, Postural dysfunction  Visit Diagnosis: Muscle weakness (generalized)  Difficulty in walking, not elsewhere classified  Abnormal posture  Other abnormalities of gait and mobility  Ataxic gait     Problem List Patient Active Problem List   Diagnosis Date Noted   Tinea pedis 05/12/2021   Diverticulosis 04/20/2021   Right lower quadrant pain 04/17/2021   Tibialis posterior tendinitis, right 04/14/2021   Relationship problems 03/23/2021   Dizziness 03/23/2021   Urinary frequency 03/23/2021   Right ankle sprain 02/12/2021   Muscular dystrophy ([place]) 28/63/8177   Systolic murmur 11/65/7903   Hypocalcemia 11/02/2020   Thrombocytopenia ([place]) 11/02/2020   COVID-19 virus infection 08/13/2020   Primary osteoarthritis of first carpometacarpal joint of one hand, left 07/11/2020   Allergic reaction 07/08/2020   History of alcohol abuse 05/20/2020   Impingement syndrome, shoulder, right 05/20/2020   Fracture of laryngeal cartilage (HCC) 01/17/2020   Esophagitis, [place] grade D 12/05/2019   Chronic alcoholic liver disease ([place]) 09/30/2019   GAD (generalized anxiety disorder) 04/16/2019   Chronic post-traumatic stress disorder (PTSD) 04/16/2019   MDD (major depressive disorder), recurrent severe, without psychosis ([place]) 04/15/2019   History of bilateral inguinal hernia repair 01/19/2019   Myopathy 11/18/2018   Laceration of extensor hallucis longus tendon, left, initial encounter  02/06/2017   Elevated liver enzymes 01/07/2017   Chronic fatigue 12/21/2016   Benign essential hypertension 12/21/2016   Kerin Perna, PTA 07/07/21 1:01 PM   [facility] [place] [address] [place], [place], [address] Phone: [phone]   Fax:  [phone]  Name: Juan [name] MRN: [id number] Date of Birth: July 18, 1966

## 2021-07-09 ENCOUNTER — Encounter: Payer: 59 | Admitting: Physical Therapy

## 2021-07-13 NOTE — Progress Notes (Signed)
Acute Office Visit  Subjective:    Patient ID: Juan [name], male    DOB: 18-Jan-1966, 55 y.o.   MRN: [id number]  Chief Complaint  Patient presents with   Head Injury    Patient is in today for head injury after a fall.   Last Thursday, approximately 5 days ago patient was outside talking to his neighbor when he missed a step and fell, hitting his head on a concrete block.  He thinks he may have temporarily lost consciousness for less than a minute. Reports that he did end up developing some bruising and swelling at the site although that has mostly resolved.  Primary impact was right side of his head and remained sore to touch.  States there were no open wounds.  He denies any drainage from ears.  Reports that he initially had double vision, that has resolved but he continues to have blurred vision, mostly with distance.  Reports his headaches continue to be 8-10 out of 10 on pain scale and only slightly improved with Tylenol and ibuprofen on board.  States he is not on a blood thinner other than baby aspirin.  He does have sensitivity to light.  Additionally he has had constant nausea and has been continuing to vomit daily although none yet today but nausea is persistent.  He did take some of his wife's Zofran which did seem to help temporarily.  His wife is also present for visit, and she is concerned about his memory/forgetfulness.  States over the past few days he has made multiple online orders that were duplicates, and sent her multiple of the same text messages/pictures and he does not recall these instances.  He remains very unsteady on his feet, worse than baseline -typically does not get too " wobbly" until the end of the day, but since the fall he has been very unsteady.    Past Medical History:  Diagnosis Date   Alcohol addiction ([place])    Anxiety    Chronic fatigue    Colon polyps    Depression    GERD (gastroesophageal reflux disease)    Hemochromatosis    Hx of blood  clots    Leg   Hyperreflexia    Hypertension    PTSD (post-traumatic stress disorder)    Traumatic hemorrhagic shock (HCC)    Ulcerative colitis ([place])     Past Surgical History:  Procedure Laterality Date   CARPAL TUNNEL RELEASE Bilateral    FRACTURE SURGERY     left ankle plate   HERNIA REPAIR     inguinal   KNEE SURGERY Right    x 4   SHOULDER SURGERY Bilateral    x 2   VASECTOMY      Family History  Problem Relation Age of Onset   Pulmonary fibrosis Mother    Other Father        liver failure   Breast cancer Paternal Aunt     Social History   Socioeconomic History   Marital status: Married    Spouse name: [name]   Number of children: 2   Years of education: Not on file   Highest education level: High school graduate  Occupational History    Comment: disability  Tobacco Use   Smoking status: Never   Smokeless tobacco: Current    Types: Snuff  Vaping Use   Vaping Use: Never used  Substance and Sexual Activity   Alcohol use: Not Currently   Drug use: Never   Sexual activity:  Not on file  Other Topics Concern   Not on file  Social History Narrative   Lives with wife   Social Determinants of Health   Financial Resource Strain: Not on file  Food Insecurity: Not on file  Transportation Needs: Not on file  Physical Activity: Not on file  Stress: Not on file  Social Connections: Not on file  Intimate Partner Violence: Not on file    Outpatient Medications Prior to Visit  Medication Sig Dispense Refill   amLODipine (NORVASC) 2.5 MG tablet Take 1 tablet (2.5 mg total) by mouth daily. 90 tablet 1   aspirin 81 MG chewable tablet Chew 81 mg by mouth daily.     B Complex Vitamins (B COMPLEX PO) Take by mouth daily.     Cholecalciferol 50 MCG (2000 UT) CAPS Take 1 capsule by mouth daily.     clotrimazole-betamethasone (LOTRISONE) cream Apply 1 application topically 2 (two) times daily. 45 g 0   DULoxetine (CYMBALTA) 20 MG capsule Take 1 capsule (20 mg  total) by mouth daily. 30 capsule 3   EPINEPHrine 0.3 mg/0.3 mL IJ SOAJ injection INJECT 0.3 MG INTO THE MUSCLE AS NEEDED FOR ANAPHYLAXIS. 2 each 0   escitalopram (LEXAPRO) 20 MG tablet Take 1/2 tablet by mouth daily for 1 week, then take 1/2 tablet every other day for 10 days then stop 9 tablet 0   famotidine (PEPCID) 20 MG tablet Take 1 tablet (20 mg total) by mouth 2 (two) times daily. 30 tablet 0   folic acid (FOLVITE) 1 MG tablet TAKE 1 TABLET (1 MG TOTAL) BY MOUTH DAILY. 90 tablet 3   HYDROcodone-acetaminophen (NORCO/VICODIN) 5-325 MG tablet Take 1-2 tablets by mouth every 4-6 hours as needed for pain for 5 days. NOT TO EXCEED 10 TABLETS A DAY. 35 tablet 0   Magnesium Oxide 400 (240 Mg) MG TABS Take 240 mg by mouth.     mirtazapine (REMERON) 15 MG tablet Take 15 mg by mouth at bedtime.     Multiple Vitamins-Minerals (YOUR LIFE MULTI ADULT GUMMIES) CHEW Chew 1 tablet by mouth daily.     nicotine (NICODERM CQ - DOSED IN MG/24 HR) 7 mg/24hr patch APPLY 1 PATCH TO SKIN ONCE A DAY *APPLY TO NON-HAIRY, CLEAN, DRY AREA (NO TOBACCO PRODUCTS)* FOR 2 WEEKS - BEGIN AFTER 14MG PATCHES COMPLETE     pantoprazole (PROTONIX) 40 MG tablet TAKE 1 TABLET (40 MG TOTAL) BY MOUTH DAILY. 90 tablet 3   potassium chloride (KLOR-CON) 10 MEQ tablet Take 1 tablet (10 mEq total) by mouth daily. 90 tablet 0   prazosin (MINIPRESS) 2 MG capsule TAKE TWO CAPSULES BY MOUTH AT BEDTIME FOR DISRUPTED SLEEP/VIVID DREAMS     saccharomyces boulardii (FLORASTOR) 250 MG capsule Take 250 mg by mouth 2 (two) times daily.      thiamine 100 MG tablet Take 100 mg by mouth daily.     No facility-administered medications prior to visit.    Allergies  Allergen Reactions   Charentais Melon (French Melon) Anaphylaxis   Cucumber Extract Itching and Nausea And Vomiting    No extracts; just cucumber    Other Anaphylaxis   Peanut Butter Flavor Anaphylaxis and Swelling   Peanut Oil Swelling   Shellfish Allergy Itching and Swelling    Cantaloupe Extract Allergy Skin Test Rash   Lactose Other (See Comments)   Strawberry Extract Nausea And Vomiting and Swelling   Vancomycin Rash   Apple Swelling   Codeine     itching  Depakote Er [Divalproex Sodium Er]     Tongue swelling    Review of Systems All review of systems negative except what is listed in the HPI     Objective:    Physical Exam Vitals reviewed.  Constitutional:      Appearance: Normal appearance.  HENT:     Head: Normocephalic.   Eyes:     General: No visual field deficit.    Extraocular Movements: Extraocular movements intact.     Right eye: Normal extraocular motion and no nystagmus.     Left eye: Normal extraocular motion and no nystagmus.     Pupils: Pupils are unequal.     Left eye: Pupil is sluggish.     Comments: Left pupil slightly larger and more sluggish than right   Neurological:     Mental Status: He is alert and oriented to person, place, and time.     Cranial Nerves: Cranial nerves 2-12 are intact. No dysarthria or facial asymmetry.     Motor: Motor function is intact.     Coordination: Coordination is intact.  Psychiatric:        Mood and Affect: Mood normal.        Behavior: Behavior normal.        Thought Content: Thought content normal.        Judgment: Judgment normal.      BP 117/75   Pulse 86   Resp 17   SpO2 97%  Wt Readings from Last 3 Encounters:  06/18/21 171 lb 1.3 oz (77.6 kg)  06/06/21 173 lb (78.5 kg)  06/02/21 173 lb (78.5 kg)    Health Maintenance Due  Topic Date Due   COVID-19 Vaccine (4 - Booster for Pfizer series) 12/29/2020    There are no preventive care reminders to display for this patient.   No results found for: TSH Lab Results  Component Value Date   WBC 9.9 04/17/2021   HGB 13.7 04/17/2021   HCT 40.4 04/17/2021   MCV 96.9 04/17/2021   PLT 65 (L) 04/17/2021   Lab Results  Component Value Date   NA 134 (L) 04/17/2021   K 3.2 (L) 04/17/2021   CO2 24 04/17/2021   GLUCOSE 89  04/17/2021   BUN 12 04/17/2021   CREATININE 0.88 04/17/2021   BILITOT 3.3 (H) 04/17/2021   ALKPHOS 82 04/06/2021   AST 73 (H) 04/17/2021   ALT 24 04/17/2021   PROT 7.4 04/17/2021   ALBUMIN 3.7 04/06/2021   CALCIUM 8.9 04/17/2021   ANIONGAP 16 (H) 04/06/2021   EGFR 103 04/17/2021   Lab Results  Component Value Date   CHOL 200 (H) 01/06/2021   Lab Results  Component Value Date   HDL 41 01/06/2021   Lab Results  Component Value Date   LDLCALC 129 (H) 01/06/2021   Lab Results  Component Value Date   TRIG 182 (H) 01/06/2021   Lab Results  Component Value Date   CHOLHDL 4.9 01/06/2021   Lab Results  Component Value Date   HGBA1C 5.1 04/08/2021       Assessment & Plan:   1. Traumatic injury of head, initial encounter Possible concussion, but given slight pupil asymmetry (wife is unsure if this is baseline or not, she has never noticed asymmetry prior) prolonged nausea/vomiting, and ongoing memory impairment, would like to proceed with head CT. Patient and wife agreeable. Educated on concussions, management, and signs and symptoms to monitor for (handout attached to AVS). Will follow-up pending results or as  needed.   - CT HEAD WO CONTRAST (5MM); Future - ondansetron (ZOFRAN-ODT) 8 MG disintegrating tablet; Dissolve 1 tablet (8 mg total) by mouth every 8 (eight) hours as needed for nausea.  Dispense: 20 tablet; Refill: 1  2. Nausea and vomiting, unspecified vomiting type - ondansetron (ZOFRAN-ODT) 8 MG disintegrating tablet; Dissolve 1 tablet (8 mg total) by mouth every 8 (eight) hours as needed for nausea.  Dispense: 20 tablet; Refill: 1   Follow-up pending results and as needed. Patient aware of signs/symptoms requiring further/urgent evaluation.  Purcell Nails Olevia Bowens, DNP, FNP-C

## 2021-07-14 ENCOUNTER — Ambulatory Visit: Payer: 59 | Admitting: Family Medicine

## 2021-07-14 ENCOUNTER — Other Ambulatory Visit (HOSPITAL_BASED_OUTPATIENT_CLINIC_OR_DEPARTMENT_OTHER): Payer: Self-pay

## 2021-07-14 ENCOUNTER — Ambulatory Visit (INDEPENDENT_AMBULATORY_CARE_PROVIDER_SITE_OTHER): Payer: 59

## 2021-07-14 ENCOUNTER — Encounter: Payer: Self-pay | Admitting: Family Medicine

## 2021-07-14 ENCOUNTER — Other Ambulatory Visit: Payer: Self-pay

## 2021-07-14 ENCOUNTER — Encounter: Payer: 59 | Admitting: Physical Therapy

## 2021-07-14 VITALS — BP 117/75 | HR 86 | Resp 17

## 2021-07-14 DIAGNOSIS — S0990XA Unspecified injury of head, initial encounter: Secondary | ICD-10-CM

## 2021-07-14 DIAGNOSIS — R519 Headache, unspecified: Secondary | ICD-10-CM | POA: Diagnosis not present

## 2021-07-14 DIAGNOSIS — R112 Nausea with vomiting, unspecified: Secondary | ICD-10-CM

## 2021-07-14 MED ORDER — ONDANSETRON 8 MG PO TBDP
8.0000 mg | ORAL_TABLET | Freq: Three times a day (TID) | ORAL | 1 refills | Status: DC | PRN
  Filled 2021-07-14: qty 20, 7d supply, fill #0

## 2021-07-14 NOTE — Patient Instructions (Signed)
Head CT today. We will let yo know results.  Continue concussion management - handout attached to this AVS Any sever symptoms, please go to the ED.  Please contact office for follow-up if symptoms do not improve or worsen. Seek emergency care if symptoms become severe.  Happy Thanksgiving!

## 2021-07-22 ENCOUNTER — Encounter: Payer: Self-pay | Admitting: Family Medicine

## 2021-07-22 ENCOUNTER — Encounter: Payer: 59 | Admitting: Physical Therapy

## 2021-07-22 DIAGNOSIS — H903 Sensorineural hearing loss, bilateral: Secondary | ICD-10-CM | POA: Diagnosis not present

## 2021-07-22 DIAGNOSIS — H8110 Benign paroxysmal vertigo, unspecified ear: Secondary | ICD-10-CM | POA: Diagnosis not present

## 2021-07-23 ENCOUNTER — Telehealth: Payer: Self-pay | Admitting: Physical Therapy

## 2021-07-23 ENCOUNTER — Other Ambulatory Visit (HOSPITAL_BASED_OUTPATIENT_CLINIC_OR_DEPARTMENT_OTHER): Payer: Self-pay

## 2021-07-23 MED ORDER — BUSPIRONE HCL 10 MG PO TABS
20.0000 mg | ORAL_TABLET | Freq: Three times a day (TID) | ORAL | 0 refills | Status: AC
  Filled 2021-07-23: qty 180, 30d supply, fill #0

## 2021-07-23 NOTE — Telephone Encounter (Signed)
Tried to call pt back in regards to a previous call we received about his ENT's recommendations for PT. Phone kept ringing and cut off. No answer obtained.   Doretha Goding April Gordy Levan, PT, DPT

## 2021-07-23 NOTE — Telephone Encounter (Signed)
Updated Rx sent in.

## 2021-07-24 ENCOUNTER — Ambulatory Visit: Payer: 59 | Admitting: Physical Therapy

## 2021-07-24 ENCOUNTER — Other Ambulatory Visit (HOSPITAL_BASED_OUTPATIENT_CLINIC_OR_DEPARTMENT_OTHER): Payer: Self-pay

## 2021-07-27 ENCOUNTER — Telehealth: Payer: Self-pay

## 2021-07-27 NOTE — Telephone Encounter (Signed)
Lvm for patient to call us back to get his appointment scheduled with Lovena Le.

## 2021-07-27 NOTE — Telephone Encounter (Signed)
Please call pt to schedule appt with Caleen Jobs concerning headaches. Thank you

## 2021-07-29 ENCOUNTER — Encounter: Payer: 59 | Admitting: Physical Therapy

## 2021-07-29 NOTE — Progress Notes (Signed)
Acute Office Visit  Subjective:    Patient ID: Juan [name], male    DOB: [birth year], 55 y.o.   MRN: [id number]  Chief Complaint  Patient presents with   Follow-up    Concussion. Having headaches, blurry vision and dizziness.    HPI Patient is in today for headaches/concussion follow-up.   07/14/21: Patient was seen after falling and hitting his head 5 days prior. He did have some mild pupil asymmetry, unsure of baseline, and a CT scan was ordered which was negative. He was given Zofran for nausea and education on concussion management.   Today he reports he has noticed some improvement in his vision, but is continue to have headaches. States headaches are daily, can get severe, tend to be worse with bright lights and loud noise. He has trouble concentrating on TV because of the headaches. Reports he continues to get nausea/vomiting (primarily at meal times) because of the headaches. Reports he has been trying to take it easy and lie around, but he does spend around 5 hours a day in front of the computer, plus does quite a bit of texting and using his phone throughout the day.    Past Medical History:  Diagnosis Date   Alcohol addiction ([place])    Anxiety    Chronic fatigue    Colon polyps    Depression    GERD (gastroesophageal reflux disease)    Hemochromatosis    Hx of blood clots    Leg   Hyperreflexia    Hypertension    PTSD (post-traumatic stress disorder)    Traumatic hemorrhagic shock (HCC)    Ulcerative colitis ([place])     Past Surgical History:  Procedure Laterality Date   CARPAL TUNNEL RELEASE Bilateral    FRACTURE SURGERY     left ankle plate   HERNIA REPAIR     inguinal   KNEE SURGERY Right    x 4   SHOULDER SURGERY Bilateral    x 2   VASECTOMY      Family History  Problem Relation Age of Onset   Pulmonary fibrosis Mother    Other Father        liver failure   Breast cancer Paternal Aunt     Social History   Socioeconomic History   Marital  status: Married    Spouse name: [name]   Number of children: 2   Years of education: Not on file   Highest education level: High school graduate  Occupational History    Comment: disability  Tobacco Use   Smoking status: Never   Smokeless tobacco: Current    Types: Snuff  Vaping Use   Vaping Use: Never used  Substance and Sexual Activity   Alcohol use: Not Currently   Drug use: Never   Sexual activity: Not on file  Other Topics Concern   Not on file  Social History Narrative   Lives with wife   Social Determinants of Health   Financial Resource Strain: Not on file  Food Insecurity: Not on file  Transportation Needs: Not on file  Physical Activity: Not on file  Stress: Not on file  Social Connections: Not on file  Intimate Partner Violence: Not on file    Outpatient Medications Prior to Visit  Medication Sig Dispense Refill   amLODipine (NORVASC) 2.5 MG tablet Take 1 tablet (2.5 mg total) by mouth daily. 90 tablet 1   aspirin 81 MG chewable tablet Chew 81 mg by mouth daily.  B Complex Vitamins (B COMPLEX PO) Take by mouth daily.     busPIRone (BUSPAR) 10 MG tablet Take 2 tablets (20 mg total) by mouth 3 (three) times daily. 180 tablet 0   Cholecalciferol 50 MCG (2000 UT) CAPS Take 1 capsule by mouth daily.     clotrimazole-betamethasone (LOTRISONE) cream Apply 1 application topically 2 (two) times daily. 45 g 0   DULoxetine (CYMBALTA) 20 MG capsule Take 1 capsule (20 mg total) by mouth daily. 30 capsule 3   EPINEPHrine 0.3 mg/0.3 mL IJ SOAJ injection INJECT 0.3 MG INTO THE MUSCLE AS NEEDED FOR ANAPHYLAXIS. 2 each 0   escitalopram (LEXAPRO) 20 MG tablet Take 1/2 tablet by mouth daily for 1 week, then take 1/2 tablet every other day for 10 days then stop 9 tablet 0   folic acid (FOLVITE) 1 MG tablet TAKE 1 TABLET (1 MG TOTAL) BY MOUTH DAILY. 90 tablet 3   Magnesium Oxide 400 (240 Mg) MG TABS Take 240 mg by mouth.     Multiple Vitamins-Minerals (YOUR LIFE MULTI ADULT  GUMMIES) CHEW Chew 1 tablet by mouth daily.     nicotine (NICODERM CQ - DOSED IN MG/24 HR) 7 mg/24hr patch APPLY 1 PATCH TO SKIN ONCE A DAY *APPLY TO NON-HAIRY, CLEAN, DRY AREA (NO TOBACCO PRODUCTS)* FOR 2 WEEKS - BEGIN AFTER 14MG PATCHES COMPLETE     ondansetron (ZOFRAN-ODT) 8 MG disintegrating tablet Dissolve 1 tablet (8 mg total) by mouth every 8 (eight) hours as needed for nausea. 20 tablet 1   pantoprazole (PROTONIX) 40 MG tablet TAKE 1 TABLET (40 MG TOTAL) BY MOUTH DAILY. 90 tablet 3   prazosin (MINIPRESS) 2 MG capsule TAKE TWO CAPSULES BY MOUTH AT BEDTIME FOR DISRUPTED SLEEP/VIVID DREAMS     saccharomyces boulardii (FLORASTOR) 250 MG capsule Take 250 mg by mouth 2 (two) times daily.      thiamine 100 MG tablet Take 100 mg by mouth daily.     famotidine (PEPCID) 20 MG tablet Take 1 tablet (20 mg total) by mouth 2 (two) times daily. 30 tablet 0   HYDROcodone-acetaminophen (NORCO/VICODIN) 5-325 MG tablet Take 1-2 tablets by mouth every 4-6 hours as needed for pain for 5 days. NOT TO EXCEED 10 TABLETS A DAY. 35 tablet 0   mirtazapine (REMERON) 15 MG tablet Take 15 mg by mouth at bedtime.     potassium chloride (KLOR-CON) 10 MEQ tablet Take 1 tablet (10 mEq total) by mouth daily. 90 tablet 0   No facility-administered medications prior to visit.    Allergies  Allergen Reactions   Charentais Melon (French Melon) Anaphylaxis   Cucumber Extract Itching and Nausea And Vomiting    No extracts; just cucumber    Other Anaphylaxis   Peanut Butter Flavor Anaphylaxis and Swelling   Peanut Oil Swelling   Shellfish Allergy Itching and Swelling   Cantaloupe Extract Allergy Skin Test Rash   Lactose Other (See Comments)   Strawberry Extract Nausea And Vomiting and Swelling   Vancomycin Rash   Apple Swelling   Codeine     itching   Depakote Er [Divalproex Sodium Er]     Tongue swelling    Review of Systems All review of systems negative except what is listed in the HPI     Objective:     Physical Exam Vitals reviewed.  Constitutional:      Appearance: Normal appearance.  HENT:     Head: Normocephalic and atraumatic.  Eyes:     Extraocular Movements: Extraocular movements  intact.     Conjunctiva/sclera: Conjunctivae normal.     Pupils: Pupils are equal, round, and reactive to light.  Cardiovascular:     Rate and Rhythm: Normal rate and regular rhythm.     Heart sounds: Murmur heard.  Pulmonary:     Effort: Pulmonary effort is normal.     Breath sounds: Normal breath sounds.  Skin:    General: Skin is warm and dry.  Neurological:     General: No focal deficit present.     Mental Status: He is alert and oriented to person, place, and time. Mental status is at baseline.  Psychiatric:        Mood and Affect: Mood normal.        Behavior: Behavior normal.        Thought Content: Thought content normal.        Judgment: Judgment normal.    BP 131/86 (BP Location: Left Arm, Patient Position: Sitting, Cuff Size: Normal)   Pulse 87   Temp 98.5 F (36.9 C) (Oral)   Ht 5' 3"  (1.6 m)   Wt 173 lb (78.5 kg)   SpO2 94%   BMI 30.65 kg/m  Wt Readings from Last 3 Encounters:  07/30/21 173 lb (78.5 kg)  06/18/21 171 lb 1.3 oz (77.6 kg)  06/06/21 173 lb (78.5 kg)    There are no preventive care reminders to display for this patient.   There are no preventive care reminders to display for this patient.   No results found for: TSH Lab Results  Component Value Date   WBC 9.9 04/17/2021   HGB 13.7 04/17/2021   HCT 40.4 04/17/2021   MCV 96.9 04/17/2021   PLT 65 (L) 04/17/2021   Lab Results  Component Value Date   NA 134 (L) 04/17/2021   K 3.2 (L) 04/17/2021   CO2 24 04/17/2021   GLUCOSE 89 04/17/2021   BUN 12 04/17/2021   CREATININE 0.88 04/17/2021   BILITOT 3.3 (H) 04/17/2021   ALKPHOS 82 04/06/2021   AST 73 (H) 04/17/2021   ALT 24 04/17/2021   PROT 7.4 04/17/2021   ALBUMIN 3.7 04/06/2021   CALCIUM 8.9 04/17/2021   ANIONGAP 16 (H) 04/06/2021   EGFR  103 04/17/2021   Lab Results  Component Value Date   CHOL 200 (H) 01/06/2021   Lab Results  Component Value Date   HDL 41 01/06/2021   Lab Results  Component Value Date   LDLCALC 129 (H) 01/06/2021   Lab Results  Component Value Date   TRIG 182 (H) 01/06/2021   Lab Results  Component Value Date   CHOLHDL 4.9 01/06/2021   Lab Results  Component Value Date   HGBA1C 5.1 04/08/2021       Assessment & Plan:   1. Concussion with unknown loss of consciousness status, subsequent encounter Recommended more strict concussion management - minimize screen time. If absolutely necessary to use computer, etc., then be sure to use blue light filtering glasses, dim the screen. Dim lights in the house and try to rest. No new neuro symptoms. Patient aware of signs/symptoms requiring further/urgent evaluation. Can continue alternating OTC analgesics as needed, but be cautious of overdoing it and causing rebound headaches.   Follow-up if symptoms worsen or fail to improve.   Purcell Nails Olevia Bowens, DNP, FNP-C

## 2021-07-30 ENCOUNTER — Ambulatory Visit: Payer: 59 | Admitting: Family Medicine

## 2021-07-30 ENCOUNTER — Other Ambulatory Visit: Payer: Self-pay

## 2021-07-30 ENCOUNTER — Encounter: Payer: Self-pay | Admitting: Family Medicine

## 2021-07-30 VITALS — BP 131/86 | HR 87 | Temp 98.5°F | Ht 63.0 in | Wt 173.0 lb

## 2021-07-30 DIAGNOSIS — S060XAD Concussion with loss of consciousness status unknown, subsequent encounter: Secondary | ICD-10-CM

## 2021-08-07 ENCOUNTER — Ambulatory Visit: Payer: 59 | Admitting: Physical Therapy

## 2021-08-07 ENCOUNTER — Encounter: Payer: 59 | Admitting: Physical Therapy

## 2021-08-12 ENCOUNTER — Encounter: Payer: 59 | Admitting: Physical Therapy

## 2021-08-20 ENCOUNTER — Ambulatory Visit (INDEPENDENT_AMBULATORY_CARE_PROVIDER_SITE_OTHER): Payer: 59 | Admitting: Physical Therapy

## 2021-08-20 ENCOUNTER — Other Ambulatory Visit: Payer: Self-pay

## 2021-08-20 DIAGNOSIS — R293 Abnormal posture: Secondary | ICD-10-CM

## 2021-08-20 DIAGNOSIS — R2689 Other abnormalities of gait and mobility: Secondary | ICD-10-CM | POA: Diagnosis not present

## 2021-08-20 DIAGNOSIS — M6281 Muscle weakness (generalized): Secondary | ICD-10-CM | POA: Diagnosis not present

## 2021-08-20 DIAGNOSIS — R42 Dizziness and giddiness: Secondary | ICD-10-CM

## 2021-08-20 DIAGNOSIS — R260 Ataxic gait: Secondary | ICD-10-CM | POA: Diagnosis not present

## 2021-08-20 DIAGNOSIS — R2681 Unsteadiness on feet: Secondary | ICD-10-CM

## 2021-08-20 DIAGNOSIS — R262 Difficulty in walking, not elsewhere classified: Secondary | ICD-10-CM

## 2021-08-20 NOTE — Therapy (Signed)
[facility] [place], [place], [address] Phone: [phone]   Fax:  [phone]  Physical Therapy Re-Evaluation and Re-Certification  Patient Details  Name: Juan [name] MRN: [id number] Date of Birth: [birth year] Referring Provider (PT): [name], DO   Encounter Date: 08/20/2021   PT End of Session - 08/20/21 0845     Visit Number 5    Number of Visits 18    Date for PT Re-Evaluation 10/01/21    Authorization Type [facility] [employer]    PT Start Time [phone]    PT Stop Time 0930    PT Time Calculation (min) 45 min    Equipment Utilized During Treatment Gait belt    Activity Tolerance Patient tolerated treatment well;Patient limited by fatigue    Behavior During Therapy [facility] for tasks assessed/performed             Past Medical History:  Diagnosis Date   Alcohol addiction ([place])    Anxiety    Chronic fatigue    Colon polyps    Depression    GERD (gastroesophageal reflux disease)    Hemochromatosis    Hx of blood clots    Leg   Hyperreflexia    Hypertension    PTSD (post-traumatic stress disorder)    Traumatic hemorrhagic shock (HCC)    Ulcerative colitis ([place])     Past Surgical History:  Procedure Laterality Date   CARPAL TUNNEL RELEASE Bilateral    FRACTURE SURGERY     left ankle plate   HERNIA REPAIR     inguinal   KNEE SURGERY Right    x 4   SHOULDER SURGERY Bilateral    x 2   VASECTOMY      There were no vitals filed for this visit.   Subjective Assessment - 08/20/21 0846     Subjective Pt returns after 1 month. Pt states he's been diagnosed with vertigo. Pt notes he did have a fall and knocked himself out. That's when dizziness started and was also diagnosed with concussion. Pt notes increased dizziness later in the day. ENT looked at ears and said they looked good. Pt notes his legs are increasing in shakiness.    Limitations Walking;House hold activities;Standing    How  long can you stand comfortably? ~10 minutes    How long can you walk comfortably? With assistance (w/c, cane, or rollator) can get around grocery store    Patient Stated Goals Getting strength back up and maintain    Currently in Pain? No/denies                [facility] PT Assessment - 08/20/21 0001       Assessment   Medical Diagnosis R26.9 (ICD-10-CM) - Gait abnormality  R29.6 (ICD-10-CM) - Frequent falls    Referring Provider (PT) [name], DO    Prior Therapy HHPT      Transfers   Five time sit to stand comments  19 sec      High Level Balance   High Level Balance Comments mCTSIB: situation 1: 30 sec, situation 2: 30 sec with increased sway, situation 3: 30 sec, situation 4: 30 sec but with multiple large balance losses pt able to self correct utilizing hip strategy                 Vestibular Assessment - 08/20/21 0001       Symptom Behavior   Subjective history of current problem Reports onset of dizziness s/p  fall    Type of Dizziness  Lightheadedness;"World moves"    Frequency of Dizziness 2-3 times    Duration of Dizziness A few seconds    Symptom Nature Motion provoked    Aggravating Factors Forward bending    Relieving Factors Slow movements;Rest    Progression of Symptoms Better      Oculomotor Exam   Oculomotor Alignment Normal    Ocular ROM WFL    Spontaneous Absent    Gaze-induced  Absent    Smooth Pursuits Intact    Saccades Hypometric   hypometric with vertical saccades vs horizontal     Oculomotor Exam-Fixation Suppressed    Left Head Impulse "Feels weird" but no corrective saccades seen    Right Head Impulse Corrective saccades seen      Vestibulo-Ocular Reflex   VOR 1 Head Only (x 1 viewing) "Just a little dizziness"    VOR to Slow Head Movement Positive right   R head turn with L gaze = mild nystagmus   VOR Cancellation Normal   "just a little dizzy     Positional Testing   Dix-Hallpike Dix-Hallpike Right;Dix-Hallpike Left     Sidelying Test Sidelying Right;Sidelying Left                                 PT Short Term Goals - 08/20/21 1035       PT SHORT TERM GOAL #1   Title Pt will have no s/s of BPPV in all canalith positions for at least 1-2 week    Time 2    Period Weeks    Status New    Target Date 09/03/21               PT Long Term Goals - 08/20/21 0931       PT LONG TERM GOAL #1   Title Pt will be independent with HEP to improve posture and strength    Time 6    Period Weeks    Status On-going    Target Date 10/01/21      PT LONG TERM GOAL #2   Title Pt will be able to improve Berg Balance Score to at least 45/56 to demo decreased risk with falls    Baseline 35/56    Time 6    Period Weeks    Status On-going    Target Date 10/01/21      PT LONG TERM GOAL #3   Title Pt will have improved 5x STS to </=20 sec to demo improved bilat LE functional strength    Baseline 19 sec on 08/20/21    Time 6    Period Weeks    Status Achieved    Target Date 10/01/21      PT LONG TERM GOAL #4   Title Pt will be able to tolerate ambulating at least 1000' with LRAD for improved home and limited community mobility    Time 6    Period Weeks    Status On-going    Target Date 10/01/21      PT LONG TERM GOAL #5   Title Pt will be able to perform 30 sec in all conditions of mCTSIB with no major LOBs requiring correction    Time 6    Period Weeks    Status New    Target Date 10/01/21  Plan - 08/20/21 1030     Clinical Impression Statement Re-evaluation and re-certification performed today. Pt returns after 1 month s/p fall on steps leading to a concussion and subsequent dizziness. Assessment significant for posterior L canalith BPPV, continued bilat LE weakness/tremors, and decreased vestibular integration with balance. Pt has been able to maintain some functional strength as demonstrated by his 5x STS (he has met LTG 3). However, pt appears to  remain a high fall risk. Pt with plans to see his neurologist in March. Current plan is to fully clear his BPPV and then return to aquatic therapy.    Personal Factors and Comorbidities Age;Sex;Social Background;Time since onset of injury/illness/exacerbation;Past/Current Experience    Examination-Activity Limitations Transfers;Squat;Stairs;Locomotion Level;Lift;Carry;Stand;Hygiene/Grooming    Examination-Participation Restrictions Cleaning;Yard Work;Community Activity;Shop    Stability/Clinical Decision Making Evolving/Moderate complexity    Rehab Potential Fair    PT Frequency 2x / week    PT Duration 6 weeks    PT Treatment/Interventions ADLs/Self Care Home Management;Aquatic Therapy;Moist Heat;Electrical Stimulation;Iontophoresis 56m/ml Dexamethasone;Cryotherapy;DME Instruction;Gait training;Stair training;Therapeutic activities;Therapeutic exercise;Balance training;Neuromuscular re-education;Manual techniques;Patient/family education;Dry needling;Taping;Energy conservation    PT Next Visit Plan Recheck for any BPPV. Provide vestibular exercises and work on vestibular integration with balance. Recheck Berg. Continue to progress strengthening, motor control, and balance.    PT Home Exercise Plan Access Code [email or web address]    Consulted and Agree with Plan of Care Patient             Patient will benefit from skilled therapeutic intervention in order to improve the following deficits and impairments:  Abnormal gait, Decreased balance, Decreased endurance, Decreased mobility, Difficulty walking, Impaired tone, Improper body mechanics, Decreased activity tolerance, Decreased coordination, Decreased strength, Impaired UE functional use, Postural dysfunction  Visit Diagnosis: Dizziness and giddiness  Unsteadiness on feet  Muscle weakness (generalized)  Difficulty in walking, not elsewhere classified  Abnormal posture  Other abnormalities of gait and mobility  Ataxic gait     Problem  List Patient Active Problem List   Diagnosis Date Noted   Tinea pedis 05/12/2021   Diverticulosis 04/20/2021   Right lower quadrant pain 04/17/2021   Tibialis posterior tendinitis, right 04/14/2021   Relationship problems 03/23/2021   Dizziness 03/23/2021   Urinary frequency 03/23/2021   Right ankle sprain 02/12/2021   Muscular dystrophy ([facility] 010/62/6948  Systolic murmur 054/62/7035  Hypocalcemia 11/02/2020   Thrombocytopenia ([facility] [place] 11/02/2020   COVID-19 virus infection 08/13/2020   Primary osteoarthritis of first carpometacarpal joint of one hand, left 07/11/2020   Allergic reaction 07/08/2020   History of alcohol abuse 05/20/2020   Impingement syndrome, shoulder, right 05/20/2020   Fracture of laryngeal cartilage ([facility] 01/17/2020   Esophagitis, [place] grade D 12/05/2019   Chronic alcoholic liver disease ([place] 09/30/2019   GAD (generalized anxiety disorder) 04/16/2019   Chronic post-traumatic stress disorder (PTSD) 04/16/2019   MDD (major depressive disorder), recurrent severe, without psychosis ([place] 04/15/2019   History of bilateral inguinal hernia repair 01/19/2019   Myopathy 11/18/2018   Laceration of extensor hallucis longus tendon, left, initial encounter 02/06/2017   Elevated liver enzymes 01/07/2017   Chronic fatigue 12/21/2016   Benign essential hypertension 12/21/2016    [facility] CountyApril MGordy Levan PT, DPT 08/20/2021, 10:38 AM  [facility]1[place] [address]SS[address] VenetiaKHayden NAl[place] [address]Phone: [phone]  Fax:  [phone] Name: SZackary MckeoneMRN: [id number] of Birth: 2August 31, 1967

## 2021-08-25 ENCOUNTER — Encounter: Payer: 59 | Admitting: Rehabilitative and Restorative Service Providers"

## 2021-08-27 ENCOUNTER — Other Ambulatory Visit: Payer: Self-pay

## 2021-08-27 ENCOUNTER — Ambulatory Visit: Payer: 59 | Attending: Family Medicine | Admitting: Physical Therapy

## 2021-08-27 DIAGNOSIS — R293 Abnormal posture: Secondary | ICD-10-CM

## 2021-08-27 DIAGNOSIS — F411 Generalized anxiety disorder: Secondary | ICD-10-CM | POA: Diagnosis not present

## 2021-08-27 DIAGNOSIS — R260 Ataxic gait: Secondary | ICD-10-CM | POA: Insufficient documentation

## 2021-08-27 DIAGNOSIS — M6281 Muscle weakness (generalized): Secondary | ICD-10-CM | POA: Insufficient documentation

## 2021-08-27 DIAGNOSIS — F1021 Alcohol dependence, in remission: Secondary | ICD-10-CM | POA: Diagnosis not present

## 2021-08-27 DIAGNOSIS — G4700 Insomnia, unspecified: Secondary | ICD-10-CM | POA: Diagnosis not present

## 2021-08-27 DIAGNOSIS — R262 Difficulty in walking, not elsewhere classified: Secondary | ICD-10-CM

## 2021-08-27 DIAGNOSIS — R42 Dizziness and giddiness: Secondary | ICD-10-CM | POA: Diagnosis not present

## 2021-08-27 DIAGNOSIS — R2681 Unsteadiness on feet: Secondary | ICD-10-CM | POA: Insufficient documentation

## 2021-08-27 DIAGNOSIS — R2689 Other abnormalities of gait and mobility: Secondary | ICD-10-CM

## 2021-08-27 NOTE — Therapy (Signed)
[place], [place], [address] Phone: [phone]   Fax:  [phone]  Physical Therapy Treatment  Patient Details  Name: Juan [name] MRN: [id number] Date of Birth: 1965-09-30 Referring Provider (PT): [name], DO   Encounter Date: 08/27/2021   PT End of Session - 08/27/21 1446     Visit Number 6    Number of Visits 18    Date for PT Re-Evaluation 10/01/21    Authorization Type [employer] [place]    PT Start Time 1446    PT Stop Time 1525   Request to leave early   PT Time Calculation (min) 39 min    Equipment Utilized During Treatment Gait belt    Activity Tolerance Patient tolerated treatment well;Patient limited by fatigue    Behavior During Therapy [facility] for tasks assessed/performed             Past Medical History:  Diagnosis Date   Alcohol addiction ([place])    Anxiety    Chronic fatigue    Colon polyps    Depression    GERD (gastroesophageal reflux disease)    Hemochromatosis    Hx of blood clots    Leg   Hyperreflexia    Hypertension    PTSD (post-traumatic stress disorder)    Traumatic hemorrhagic shock (HCC)    Ulcerative colitis ([place])     Past Surgical History:  Procedure Laterality Date   CARPAL TUNNEL RELEASE Bilateral    FRACTURE SURGERY     left ankle plate   HERNIA REPAIR     inguinal   KNEE SURGERY Right    x 4   SHOULDER SURGERY Bilateral    x 2   VASECTOMY      There were no vitals filed for this visit.   Subjective Assessment - 08/27/21 1454     Subjective Pt reports no dizziness however he did stand up quickly and got lightheaded. Pt fell backwards and bruised his flank.    Limitations Walking;House hold activities;Standing    How long can you stand comfortably? ~10 minutes    How long can you walk comfortably? With assistance (w/c, cane, or rollator) can get around grocery store    Patient Stated Goals Getting strength back up and maintain                      Vestibular Assessment - 08/27/21 0001       Dix-Hallpike Right   Dix-Hallpike Right Duration 0      Dix-Hallpike Left   Dix-Hallpike Left Duration 0      Sidelying Right   Sidelying Right Duration 0      Sidelying Left   Sidelying Left Duration 0                      [facility] Adult PT Treatment/Exercise - 08/27/21 0001       Lumbar Exercises: Supine   Bridge with Ball Squeeze Compliant;20 reps    Bridge with clamshell Compliant;20 reps    Other Supine Lumbar Exercises PPT with marching x10      Lumbar Exercises: Sidelying   Clam Right;Left;10 reps      Lumbar Exercises: Quadruped   Other Quadruped Lumbar Exercises primal push up x 10 sec x 3 reps                 Balance Exercises - 08/27/21 0001       Balance  Exercises: Standing   Standing Eyes Closed Narrow base of support (BOS);Solid surface;2 reps;30 secs   2x10 head turns and head nods each; neck extended with alternating arm raises 2x10                 PT Short Term Goals - 08/27/21 1527       PT SHORT TERM GOAL #1   Title Pt will have no s/s of BPPV in all canalith positions for at least 1-2 week    Time 2    Period Weeks    Status Achieved    Target Date 09/03/21               PT Long Term Goals - 08/20/21 0931       PT LONG TERM GOAL #1   Title Pt will be independent with HEP to improve posture and strength    Time 6    Period Weeks    Status On-going    Target Date 10/01/21      PT LONG TERM GOAL #2   Title Pt will be able to improve Berg Balance Score to at least 45/56 to demo decreased risk with falls    Baseline 35/56    Time 6    Period Weeks    Status On-going    Target Date 10/01/21      PT LONG TERM GOAL #3   Title Pt will have improved 5x STS to </=20 sec to demo improved bilat LE functional strength    Baseline 19 sec on 08/20/21    Time 6    Period Weeks    Status Achieved    Target Date 10/01/21      PT LONG  TERM GOAL #4   Title Pt will be able to tolerate ambulating at least 1000' with LRAD for improved home and limited community mobility    Time 6    Period Weeks    Status On-going    Target Date 10/01/21      PT LONG TERM GOAL #5   Title Pt will be able to perform 30 sec in all conditions of mCTSIB with no major LOBs requiring correction    Time 6    Period Weeks    Status New    Target Date 10/01/21                   Plan - 08/27/21 1500     Clinical Impression Statement Rechecked pt for BPPV and he was (-) in all canalith positions. Continued to work on hip and core strengthening this session. Initiated balance exercise with focus on vestibular integration. Pt has difficulty maintaining balance with neck extended and using arms (i.e. shampooing hair). Reviewed and updated HEP.    Personal Factors and Comorbidities Age;Sex;Social Background;Time since onset of injury/illness/exacerbation;Past/Current Experience    Examination-Activity Limitations Transfers;Squat;Stairs;Locomotion Level;Lift;Carry;Stand;Hygiene/Grooming    Examination-Participation Restrictions Cleaning;Yard Work;Community Activity;Shop    Stability/Clinical Decision Making Evolving/Moderate complexity    Rehab Potential Fair    PT Frequency 2x / week    PT Duration 6 weeks    PT Treatment/Interventions ADLs/Self Care Home Management;Aquatic Therapy;Moist Heat;Electrical Stimulation;Iontophoresis 4mg /ml Dexamethasone;Cryotherapy;DME Instruction;Gait training;Stair training;Therapeutic activities;Therapeutic exercise;Balance training;Neuromuscular re-education;Manual techniques;Patient/family education;Dry needling;Taping;Energy conservation    PT Next Visit Plan Recheck for any BPPV. Provide vestibular exercises and work on vestibular integration with balance. Recheck Berg. Continue to progress strengthening, motor control, and balance.    PT Home Exercise Plan Access Code [email or web address]    Consulted  and Agree with  Plan of Care Patient             Patient will benefit from skilled therapeutic intervention in order to improve the following deficits and impairments:  Abnormal gait, Decreased balance, Decreased endurance, Decreased mobility, Difficulty walking, Impaired tone, Improper body mechanics, Decreased activity tolerance, Decreased coordination, Decreased strength, Impaired UE functional use, Postural dysfunction  Visit Diagnosis: Dizziness and giddiness  Unsteadiness on feet  Muscle weakness (generalized)  Difficulty in walking, not elsewhere classified  Abnormal posture  Other abnormalities of gait and mobility  Ataxic gait     Problem List Patient Active Problem List   Diagnosis Date Noted   Tinea pedis 05/12/2021   Diverticulosis 04/20/2021   Right lower quadrant pain 04/17/2021   Tibialis posterior tendinitis, right 04/14/2021   Relationship problems 03/23/2021   Dizziness 03/23/2021   Urinary frequency 03/23/2021   Right ankle sprain 02/12/2021   Muscular dystrophy ([place]) 123XX123   Systolic murmur 123XX123   Hypocalcemia 11/02/2020   Thrombocytopenia ([place]) 11/02/2020   COVID-19 virus infection 08/13/2020   Primary osteoarthritis of first carpometacarpal joint of one hand, left 07/11/2020   Allergic reaction 07/08/2020   History of alcohol abuse 05/20/2020   Impingement syndrome, shoulder, right 05/20/2020   Fracture of laryngeal cartilage ([place]) 01/17/2020   Esophagitis, [place] grade D 12/05/2019   Chronic alcoholic liver disease ([place]) 09/30/2019   GAD (generalized anxiety disorder) 04/16/2019   Chronic post-traumatic stress disorder (PTSD) 04/16/2019   MDD (major depressive disorder), recurrent severe, without psychosis ([place]) 04/15/2019   History of bilateral inguinal hernia repair 01/19/2019   Myopathy 11/18/2018   Laceration of extensor hallucis longus tendon, left, initial encounter 02/06/2017   Elevated liver enzymes 01/07/2017   Chronic fatigue  12/21/2016   Benign essential hypertension 12/21/2016    [facility] April Gordy Levan, PT, DPT 08/27/2021, 3:27 PM  [facility] [place] [address] [place], [place], [address] Phone: [phone]   Fax:  [phone]  Name: Juan [name] MRN: [id number] Date of Birth: 03-13-1966

## 2021-09-01 ENCOUNTER — Encounter: Payer: 59 | Admitting: Rehabilitative and Restorative Service Providers"

## 2021-09-02 ENCOUNTER — Other Ambulatory Visit (HOSPITAL_BASED_OUTPATIENT_CLINIC_OR_DEPARTMENT_OTHER): Payer: Self-pay

## 2021-09-02 ENCOUNTER — Ambulatory Visit: Payer: 59 | Admitting: Family Medicine

## 2021-09-02 ENCOUNTER — Encounter: Payer: Self-pay | Admitting: Family Medicine

## 2021-09-02 ENCOUNTER — Other Ambulatory Visit: Payer: Self-pay

## 2021-09-02 VITALS — BP 118/63 | HR 79 | Ht 65.0 in | Wt 163.0 lb

## 2021-09-02 DIAGNOSIS — F4312 Post-traumatic stress disorder, chronic: Secondary | ICD-10-CM | POA: Diagnosis not present

## 2021-09-02 DIAGNOSIS — F411 Generalized anxiety disorder: Secondary | ICD-10-CM

## 2021-09-02 DIAGNOSIS — F332 Major depressive disorder, recurrent severe without psychotic features: Secondary | ICD-10-CM | POA: Diagnosis not present

## 2021-09-02 DIAGNOSIS — I10 Essential (primary) hypertension: Secondary | ICD-10-CM | POA: Diagnosis not present

## 2021-09-02 DIAGNOSIS — G729 Myopathy, unspecified: Secondary | ICD-10-CM

## 2021-09-02 MED ORDER — VORTIOXETINE HBR 10 MG PO TABS
10.0000 mg | ORAL_TABLET | Freq: Every day | ORAL | 3 refills | Status: DC
  Filled 2021-09-02: qty 30, 30d supply, fill #0
  Filled 2022-01-12 – 2022-01-22 (×2): qty 30, 30d supply, fill #1
  Filled 2022-04-16: qty 30, 30d supply, fill #2
  Filled 2022-07-20: qty 30, 30d supply, fill #3

## 2021-09-02 NOTE — Progress Notes (Signed)
Juan [name] - 56 y.o. male MRN [id number]  Date of birth: 09-23-1965  Subjective Chief Complaint  Patient presents with   Follow-up    HPI Juan [name] is a 56 year old male here today for follow-up of anxiety/PTSD and balance issues.  Continues to participate in physical therapy to help with balance and strengthening and has recently started aquatic therapy.  He is hoping to learn some exercises from this and carried this over to his local pool at the [facility].  Has been more irritable since starting Cymbalta.  He has tried Lexapro, fluoxetine and bupropion.  These were either ineffective or he had side effects.  Most recently was on Lexapro and had sexual side effects with this.  ROS:  A comprehensive ROS was completed and negative except as noted per HPI  Allergies  Allergen Reactions   Charentais Melon (French Melon) Anaphylaxis   Cucumber Extract Itching and Nausea And Vomiting    No extracts; just cucumber    Other Anaphylaxis   Peanut Butter Flavor Anaphylaxis and Swelling   Peanut Oil Swelling   Shellfish Allergy Itching and Swelling   Cantaloupe Extract Allergy Skin Test Rash   Lactose Other (See Comments)   Strawberry Extract Nausea And Vomiting and Swelling   Vancomycin Rash   Apple Swelling   Codeine     itching   Depakote Er [Divalproex Sodium Er]     Tongue swelling    Past Medical History:  Diagnosis Date   Alcohol addiction (HCC)    Anxiety    Chronic fatigue    Colon polyps    Depression    GERD (gastroesophageal reflux disease)    Hemochromatosis    Hx of blood clots    Leg   Hyperreflexia    Hypertension    PTSD (post-traumatic stress disorder)    Traumatic hemorrhagic shock (HCC)    Ulcerative colitis (HCC)     Past Surgical History:  Procedure Laterality Date   CARPAL TUNNEL RELEASE Bilateral    FRACTURE SURGERY     left ankle plate   HERNIA REPAIR     inguinal   KNEE SURGERY Right    x 4   SHOULDER SURGERY Bilateral    x 2   VASECTOMY       Social History   Socioeconomic History   Marital status: Married    Spouse name: Juan [name]   Number of children: 2   Years of education: Not on file   Highest education level: High school graduate  Occupational History    Comment: disability  Tobacco Use   Smoking status: Never   Smokeless tobacco: Current    Types: Snuff  Vaping Use   Vaping Use: Never used  Substance and Sexual Activity   Alcohol use: Not Currently   Drug use: Never   Sexual activity: Not on file  Other Topics Concern   Not on file  Social History Narrative   Lives with wife   Social Determinants of Health   Financial Resource Strain: Not on file  Food Insecurity: Not on file  Transportation Needs: Not on file  Physical Activity: Not on file  Stress: Not on file  Social Connections: Not on file    Family History  Problem Relation Age of Onset   Pulmonary fibrosis Mother    Other Father        liver failure   Breast cancer Paternal Aunt     Health Maintenance  Topic Date Due   COVID-19 Vaccine (4 - Booster  for [place] series) 12/29/2020   INFLUENZA VACCINE  11/20/2021 (Originally 03/23/2021)   Pneumococcal Vaccine 73-75 Years old (1 - PCV) 02/12/2022 (Originally 10/03/1973)   TETANUS/TDAP  06/20/2029   COLONOSCOPY (Pts 45-49yrs Insurance coverage will need to be confirmed)  10/14/2029   Hepatitis C Screening  Completed   HIV Screening  Completed   Zoster Vaccines- Shingrix  Completed   HPV VACCINES  Aged Out     ----------------------------------------------------------------------------------------------------------------------------------------------------------------------------------------------------------------- Physical Exam BP 118/63 (BP Location: Left Arm, Patient Position: Sitting, Cuff Size: Normal)    Pulse 79    Ht 5\' 5"  (1.651 m)    Wt 163 lb (73.9 kg)    SpO2 96%    BMI 27.12 kg/m   Physical Exam Constitutional:      Appearance: Normal appearance.  Neurological:      Mental Status: He is alert.  Psychiatric:        Mood and Affect: Mood normal.        Behavior: Behavior normal.    ------------------------------------------------------------------------------------------------------------------------------------------------------------------------------------------------------------------- Assessment and Plan  Benign essential hypertension Blood pressure remains well controlled at this time.  Continue amlodipine at low-dose for now.  MDD (major depressive disorder), recurrent severe, without psychosis ([place]) Continues to have difficulty with depression and anxiety.  Not tolerating Cymbalta well and has had side effects with other medications.  We discussed trying Trintellix, Starting at 10 mg daily.  Myopathy Continue with physical and aquatic therapy.   Meds ordered this encounter  Medications   vortioxetine HBr (TRINTELLIX) 10 MG TABS tablet    Sig: Take 1 tablet (10 mg total) by mouth daily.    Dispense:  30 tablet    Refill:  3    Return in about 3 months (around 12/01/2021) for GAD.    This visit occurred during the SARS-CoV-2 public health emergency.  Safety protocols were in place, including screening questions prior to the visit, additional usage of staff PPE, and extensive cleaning of exam room while observing appropriate contact time as indicated for disinfecting solutions.

## 2021-09-02 NOTE — Patient Instructions (Addendum)
Reduce cymbalta to every other day x1 week then start trintellix Follow up in 3 months

## 2021-09-02 NOTE — Assessment & Plan Note (Signed)
Continues to have difficulty with depression and anxiety.  Not tolerating Cymbalta well and has had side effects with other medications.  We discussed trying Trintellix, Starting at 10 mg daily.

## 2021-09-02 NOTE — Assessment & Plan Note (Signed)
Continue with physical and aquatic therapy.

## 2021-09-02 NOTE — Assessment & Plan Note (Signed)
Blood pressure remains well controlled at this time.  Continue amlodipine at low-dose for now.

## 2021-09-04 ENCOUNTER — Ambulatory Visit: Payer: 59 | Admitting: Physical Therapy

## 2021-09-04 ENCOUNTER — Other Ambulatory Visit: Payer: Self-pay

## 2021-09-04 DIAGNOSIS — R293 Abnormal posture: Secondary | ICD-10-CM

## 2021-09-04 DIAGNOSIS — R260 Ataxic gait: Secondary | ICD-10-CM | POA: Diagnosis not present

## 2021-09-04 DIAGNOSIS — R262 Difficulty in walking, not elsewhere classified: Secondary | ICD-10-CM

## 2021-09-04 DIAGNOSIS — M6281 Muscle weakness (generalized): Secondary | ICD-10-CM | POA: Diagnosis not present

## 2021-09-04 DIAGNOSIS — R42 Dizziness and giddiness: Secondary | ICD-10-CM | POA: Diagnosis not present

## 2021-09-04 DIAGNOSIS — R2681 Unsteadiness on feet: Secondary | ICD-10-CM

## 2021-09-04 NOTE — Therapy (Addendum)
[facility] [place] [address] [place], [place], [address] Phone: [phone]   Fax:  [phone]  Physical Therapy Treatment and Discharge  Patient Details  Name: Juan [name] MRN: [id number] Date of Birth: June 05, 1966 Referring Provider (PT): Everrett Coombe, DO   Encounter Date: 09/04/2021   PT End of Session - 09/04/21 1703     Visit Number 7    Number of Visits 18    Date for PT Re-Evaluation 10/01/21    Authorization Type [place] [employer]    PT Start Time 1440    PT Stop Time 1520    PT Time Calculation (min) 40 min    Activity Tolerance Patient tolerated treatment well;Patient limited by fatigue    Behavior During Therapy [facility] for tasks assessed/performed             Past Medical History:  Diagnosis Date   Alcohol addiction (HCC)    Anxiety    Chronic fatigue    Colon polyps    Depression    GERD (gastroesophageal reflux disease)    Hemochromatosis    Hx of blood clots    Leg   Hyperreflexia    Hypertension    PTSD (post-traumatic stress disorder)    Traumatic hemorrhagic shock (HCC)    Ulcerative colitis (HCC)     Past Surgical History:  Procedure Laterality Date   CARPAL TUNNEL RELEASE Bilateral    FRACTURE SURGERY     left ankle plate   HERNIA REPAIR     inguinal   KNEE SURGERY Right    x 4   SHOULDER SURGERY Bilateral    x 2   VASECTOMY      There were no vitals filed for this visit.   Subjective Assessment - 09/04/21 1701     Subjective Pt reports that the treatment last session help with his dizziness/ vertigo.  He had one fall since last session, "I missed a step".  He has been completing HEP on own on daily basis. He is interested in learning some exercises to complete in water with daughter at [facility].    Limitations Walking;House hold activities;Standing    How long can you stand comfortably? ~10 minutes    How long can you walk comfortably? With assistance (w/c, cane, or rollator)  can get around grocery store    Patient Stated Goals Getting strength back up and maintain    Currently in Pain? No/denies    Pain Score 0-No pain            Pt seen for aquatic therapy today.  Treatment took place in water 3.25-4 ft in depth at the [facility] pool. Temp of water was 93.  Pt entered the pool via stairs with close SBA with bilat rail.  Pt exited pool via lift chair due to fatigue.   Treatment:   Holding onto water walker (with close SBA):  forward, backward gait, and side stepping.   Holding onto wall:  heel raises x 10,  lateral step ups x 5 each side.   With HHA: forward/ backward gait.  Floating on back with legs supported by pool noodle:  elementary breast stroke arms (monkey, airplane, soldier motion).   Seated on bench:  LAQ with 2# on ankle x 10 each leg.  Seated on pool noodle (between legs like horse):  forward and backward gait. Mini squats.    Pt requires buoyancy for support and to offload joints with strengthening exercises. Viscosity of the water is  needed for resistance of strengthening; water current perturbations provides challenge to standing balance unsupported, requiring increased core activation.    PT Short Term Goals - 08/27/21 1527       PT SHORT TERM GOAL #1   Title Pt will have no s/s of BPPV in all canalith positions for at least 1-2 week    Time 2    Period Weeks    Status Achieved    Target Date 09/03/21               PT Long Term Goals - 08/20/21 0931       PT LONG TERM GOAL #1   Title Pt will be independent with HEP to improve posture and strength    Time 6    Period Weeks    Status On-going    Target Date 10/01/21      PT LONG TERM GOAL #2   Title Pt will be able to improve Berg Balance Score to at least 45/56 to demo decreased risk with falls    Baseline 35/56    Time 6    Period Weeks    Status On-going    Target Date 10/01/21      PT LONG TERM GOAL #3   Title Pt will have improved 5x STS to </=20  sec to demo improved bilat LE functional strength    Baseline 19 sec on 08/20/21    Time 6    Period Weeks    Status Achieved    Target Date 10/01/21      PT LONG TERM GOAL #4   Title Pt will be able to tolerate ambulating at least 1000' with LRAD for improved home and limited community mobility    Time 6    Period Weeks    Status On-going    Target Date 10/01/21      PT LONG TERM GOAL #5   Title Pt will be able to perform 30 sec in all conditions of mCTSIB with no major LOBs requiring correction    Time 6    Period Weeks    Status New    Target Date 10/01/21                   Plan - 09/04/21 1704     Clinical Impression Statement Pt was able to descend in the water with UE on rails and close SBA.  He reported feeling of "jelly legs" with gait in water.  Initially used water walker for UE support for improved balance, but did better with HHA or pool noodle between legs and close SBA.  Alternated exercises to avoid overtaxing LE or UE.  Pt reported fatigue at end of session.  Utilized lift chair for pt to exit water at end of session.  Goals are ongoing.    Personal Factors and Comorbidities Age;Sex;Social Background;Time since onset of injury/illness/exacerbation;Past/Current Experience    Examination-Activity Limitations Transfers;Squat;Stairs;Locomotion Level;Lift;Carry;Stand;Hygiene/Grooming    Examination-Participation Restrictions Cleaning;Yard Work;Community Activity;Shop    Stability/Clinical Decision Making Evolving/Moderate complexity    Rehab Potential Fair    PT Frequency 2x / week    PT Duration 6 weeks    PT Treatment/Interventions ADLs/Self Care Home Management;Aquatic Therapy;Moist Heat;Electrical Stimulation;Iontophoresis 4mg /ml Dexamethasone;Cryotherapy;DME Instruction;Gait training;Stair training;Therapeutic activities;Therapeutic exercise;Balance training;Neuromuscular re-education;Manual techniques;Patient/family education;Dry needling;Taping;Energy  conservation    PT Next Visit Plan Recheck for any BPPV. Provide vestibular exercises and work on vestibular integration with balance. Recheck Berg. Continue to progress strengthening, motor control, and balance.  PT Home Exercise Plan Access Code [email or web address]    Consulted and Agree with Plan of Care Patient             Patient will benefit from skilled therapeutic intervention in order to improve the following deficits and impairments:  Abnormal gait, Decreased balance, Decreased endurance, Decreased mobility, Difficulty walking, Impaired tone, Improper body mechanics, Decreased activity tolerance, Decreased coordination, Decreased strength, Impaired UE functional use, Postural dysfunction  Visit Diagnosis: Unsteadiness on feet  Muscle weakness (generalized)  Difficulty in walking, not elsewhere classified  Abnormal posture     Problem List Patient Active Problem List   Diagnosis Date Noted   Tinea pedis 05/12/2021   Diverticulosis 04/20/2021   Right lower quadrant pain 04/17/2021   Tibialis posterior tendinitis, right 04/14/2021   Relationship problems 03/23/2021   Dizziness 03/23/2021   Urinary frequency 03/23/2021   Right ankle sprain 02/12/2021   Muscular dystrophy (HCC) 01/06/2021   Systolic murmur 01/06/2021   Hypocalcemia 11/02/2020   Thrombocytopenia (HCC) 11/02/2020   COVID-19 virus infection 08/13/2020   Primary osteoarthritis of first carpometacarpal joint of one hand, left 07/11/2020   Allergic reaction 07/08/2020   History of alcohol abuse 05/20/2020   Impingement syndrome, shoulder, right 05/20/2020   Fracture of laryngeal cartilage (HCC) 01/17/2020   Esophagitis, [place] grade D 12/05/2019   Chronic alcoholic liver disease (HCC) 09/30/2019   GAD (generalized anxiety disorder) 04/16/2019   Chronic post-traumatic stress disorder (PTSD) 04/16/2019   MDD (major depressive disorder), recurrent severe, 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   History of  bilateral inguinal hernia repair 01/19/2019   Myopathy 11/18/2018   Laceration of extensor hallucis longus tendon, left, initial encounter 02/06/2017   Elevated liver enzymes 01/07/2017   Chronic fatigue 12/21/2016   Benign essential hypertension 12/21/2016  PHYSICAL THERAPY DISCHARGE SUMMARY  Visits from Start of Care: 7  Current functional level related to goals / functional outcomes: Improving balance   Remaining deficits: See above   Education / Equipment: HEP   Patient agrees to discharge. Patient goals were partially met. Patient is being discharged due to not returning since the last visit.  Reggy Eye, PT,DPT03/29/239:12 AM   Mayer Camel, PTA 09/04/21 5:17 PM  [facility] [address] [place], [place], [address] Phone: [phone]   Fax:  [phone]  Name: Juan [name] MRN: [id number] Date of Birth: June 16, 1966

## 2021-09-08 ENCOUNTER — Ambulatory Visit: Payer: 59 | Admitting: Rehabilitative and Restorative Service Providers"

## 2021-09-09 ENCOUNTER — Ambulatory Visit: Payer: 59

## 2021-09-10 ENCOUNTER — Ambulatory Visit (INDEPENDENT_AMBULATORY_CARE_PROVIDER_SITE_OTHER): Payer: 59 | Admitting: Family Medicine

## 2021-09-10 ENCOUNTER — Ambulatory Visit: Payer: 59 | Admitting: Physical Therapy

## 2021-09-10 ENCOUNTER — Other Ambulatory Visit: Payer: Self-pay | Admitting: Family Medicine

## 2021-09-10 ENCOUNTER — Other Ambulatory Visit (HOSPITAL_BASED_OUTPATIENT_CLINIC_OR_DEPARTMENT_OTHER): Payer: Self-pay

## 2021-09-10 ENCOUNTER — Other Ambulatory Visit: Payer: Self-pay

## 2021-09-10 DIAGNOSIS — M791 Myalgia, unspecified site: Secondary | ICD-10-CM

## 2021-09-10 LAB — POCT INFLUENZA A/B
Influenza A, POC: NEGATIVE
Influenza B, POC: POSITIVE — AB

## 2021-09-10 MED ORDER — OSELTAMIVIR PHOSPHATE 75 MG PO CAPS
75.0000 mg | ORAL_CAPSULE | Freq: Two times a day (BID) | ORAL | 0 refills | Status: AC
  Filled 2021-09-10: qty 10, 5d supply, fill #0

## 2021-09-10 NOTE — Progress Notes (Signed)
Pt here for flu test only per Dr. Zigmund Daniel.  Flu B is positive, Flu A is negative.  Charyl Bigger, CMA

## 2021-09-10 NOTE — Progress Notes (Signed)
+   influenza B.  Rx for tamiflu sent.

## 2021-09-10 NOTE — Progress Notes (Signed)
Rx for tamiflu sent

## 2021-09-15 ENCOUNTER — Ambulatory Visit: Payer: 59 | Admitting: Physical Therapy

## 2021-09-18 ENCOUNTER — Encounter (HOSPITAL_BASED_OUTPATIENT_CLINIC_OR_DEPARTMENT_OTHER): Payer: Self-pay | Admitting: *Deleted

## 2021-09-18 ENCOUNTER — Emergency Department (HOSPITAL_BASED_OUTPATIENT_CLINIC_OR_DEPARTMENT_OTHER): Payer: 59

## 2021-09-18 ENCOUNTER — Inpatient Hospital Stay (HOSPITAL_BASED_OUTPATIENT_CLINIC_OR_DEPARTMENT_OTHER)
Admission: EM | Admit: 2021-09-18 | Discharge: 2021-09-24 | DRG: 809 | Disposition: A | Discharge status: Home / Self Care | Payer: 59 | Attending: Internal Medicine | Admitting: Internal Medicine

## 2021-09-18 ENCOUNTER — Other Ambulatory Visit: Payer: Self-pay

## 2021-09-18 ENCOUNTER — Ambulatory Visit: Payer: 59 | Admitting: Physical Therapy

## 2021-09-18 DIAGNOSIS — Z885 Allergy status to narcotic agent status: Secondary | ICD-10-CM

## 2021-09-18 DIAGNOSIS — Z803 Family history of malignant neoplasm of breast: Secondary | ICD-10-CM

## 2021-09-18 DIAGNOSIS — Z79899 Other long term (current) drug therapy: Secondary | ICD-10-CM

## 2021-09-18 DIAGNOSIS — K760 Fatty (change of) liver, not elsewhere classified: Secondary | ICD-10-CM | POA: Diagnosis present

## 2021-09-18 DIAGNOSIS — K449 Diaphragmatic hernia without obstruction or gangrene: Secondary | ICD-10-CM | POA: Diagnosis present

## 2021-09-18 DIAGNOSIS — F17290 Nicotine dependence, other tobacco product, uncomplicated: Secondary | ICD-10-CM | POA: Diagnosis present

## 2021-09-18 DIAGNOSIS — E871 Hypo-osmolality and hyponatremia: Secondary | ICD-10-CM | POA: Diagnosis not present

## 2021-09-18 DIAGNOSIS — K5900 Constipation, unspecified: Secondary | ICD-10-CM | POA: Diagnosis present

## 2021-09-18 DIAGNOSIS — E8721 Acute metabolic acidosis: Secondary | ICD-10-CM | POA: Diagnosis present

## 2021-09-18 DIAGNOSIS — K2100 Gastro-esophageal reflux disease with esophagitis, without bleeding: Secondary | ICD-10-CM | POA: Diagnosis not present

## 2021-09-18 DIAGNOSIS — R739 Hyperglycemia, unspecified: Secondary | ICD-10-CM | POA: Diagnosis present

## 2021-09-18 DIAGNOSIS — R112 Nausea with vomiting, unspecified: Secondary | ICD-10-CM | POA: Diagnosis not present

## 2021-09-18 DIAGNOSIS — D61818 Other pancytopenia: Principal | ICD-10-CM | POA: Diagnosis present

## 2021-09-18 DIAGNOSIS — R319 Hematuria, unspecified: Secondary | ICD-10-CM | POA: Diagnosis not present

## 2021-09-18 DIAGNOSIS — K7460 Unspecified cirrhosis of liver: Secondary | ICD-10-CM | POA: Diagnosis present

## 2021-09-18 DIAGNOSIS — K8020 Calculus of gallbladder without cholecystitis without obstruction: Secondary | ICD-10-CM | POA: Diagnosis not present

## 2021-09-18 DIAGNOSIS — I10 Essential (primary) hypertension: Secondary | ICD-10-CM | POA: Diagnosis present

## 2021-09-18 DIAGNOSIS — K921 Melena: Secondary | ICD-10-CM | POA: Diagnosis not present

## 2021-09-18 DIAGNOSIS — G7100 Muscular dystrophy, unspecified: Secondary | ICD-10-CM | POA: Diagnosis present

## 2021-09-18 DIAGNOSIS — K642 Third degree hemorrhoids: Secondary | ICD-10-CM | POA: Diagnosis not present

## 2021-09-18 DIAGNOSIS — E876 Hypokalemia: Secondary | ICD-10-CM | POA: Diagnosis present

## 2021-09-18 DIAGNOSIS — K766 Portal hypertension: Secondary | ICD-10-CM | POA: Diagnosis not present

## 2021-09-18 DIAGNOSIS — F4310 Post-traumatic stress disorder, unspecified: Secondary | ICD-10-CM | POA: Diagnosis present

## 2021-09-18 DIAGNOSIS — F1021 Alcohol dependence, in remission: Secondary | ICD-10-CM | POA: Diagnosis present

## 2021-09-18 DIAGNOSIS — D689 Coagulation defect, unspecified: Secondary | ICD-10-CM | POA: Diagnosis present

## 2021-09-18 DIAGNOSIS — R7309 Other abnormal glucose: Secondary | ICD-10-CM

## 2021-09-18 DIAGNOSIS — Z888 Allergy status to other drugs, medicaments and biological substances status: Secondary | ICD-10-CM

## 2021-09-18 DIAGNOSIS — D649 Anemia, unspecified: Secondary | ICD-10-CM | POA: Diagnosis not present

## 2021-09-18 DIAGNOSIS — E7220 Disorder of urea cycle metabolism, unspecified: Secondary | ICD-10-CM

## 2021-09-18 DIAGNOSIS — E860 Dehydration: Secondary | ICD-10-CM | POA: Diagnosis present

## 2021-09-18 DIAGNOSIS — Z20822 Contact with and (suspected) exposure to covid-19: Secondary | ICD-10-CM | POA: Diagnosis not present

## 2021-09-18 DIAGNOSIS — R109 Unspecified abdominal pain: Secondary | ICD-10-CM | POA: Diagnosis not present

## 2021-09-18 DIAGNOSIS — R531 Weakness: Secondary | ICD-10-CM | POA: Diagnosis present

## 2021-09-18 DIAGNOSIS — R1011 Right upper quadrant pain: Secondary | ICD-10-CM | POA: Diagnosis not present

## 2021-09-18 DIAGNOSIS — K319 Disease of stomach and duodenum, unspecified: Secondary | ICD-10-CM | POA: Diagnosis not present

## 2021-09-18 DIAGNOSIS — Z7409 Other reduced mobility: Secondary | ICD-10-CM | POA: Diagnosis present

## 2021-09-18 DIAGNOSIS — R748 Abnormal levels of other serum enzymes: Secondary | ICD-10-CM

## 2021-09-18 DIAGNOSIS — Z86718 Personal history of other venous thrombosis and embolism: Secondary | ICD-10-CM

## 2021-09-18 DIAGNOSIS — Z7982 Long term (current) use of aspirin: Secondary | ICD-10-CM

## 2021-09-18 DIAGNOSIS — K3189 Other diseases of stomach and duodenum: Secondary | ICD-10-CM

## 2021-09-18 DIAGNOSIS — Z86010 Personal history of colonic polyps: Secondary | ICD-10-CM

## 2021-09-18 DIAGNOSIS — I700 Atherosclerosis of aorta: Secondary | ICD-10-CM | POA: Diagnosis not present

## 2021-09-18 DIAGNOSIS — Z8719 Personal history of other diseases of the digestive system: Secondary | ICD-10-CM | POA: Diagnosis not present

## 2021-09-18 HISTORY — DX: Other pancytopenia: D61.818

## 2021-09-18 LAB — CBC WITH DIFFERENTIAL/PLATELET
Abs Immature Granulocytes: 0.01 10*3/uL (ref 0.00–0.07)
Abs Immature Granulocytes: 0.01 10*3/uL (ref 0.00–0.07)
Basophils Absolute: 0 10*3/uL (ref 0.0–0.1)
Basophils Absolute: 0 10*3/uL (ref 0.0–0.1)
Basophils Relative: 1 %
Basophils Relative: 1 %
Eosinophils Absolute: 0 10*3/uL (ref 0.0–0.5)
Eosinophils Absolute: 0.1 10*3/uL (ref 0.0–0.5)
Eosinophils Relative: 1 %
Eosinophils Relative: 2 %
HCT: 33.8 % — ABNORMAL LOW (ref 39.0–52.0)
HCT: 29.4 % — ABNORMAL LOW (ref 39.0–52.0)
Hemoglobin: 12.4 g/dL — ABNORMAL LOW (ref 13.0–17.0)
Hemoglobin: 11 g/dL — ABNORMAL LOW (ref 13.0–17.0)
Immature Granulocytes: 0 %
Immature Granulocytes: 0 %
Lymphocytes Relative: 20 %
Lymphocytes Relative: 25 %
Lymphs Abs: 0.6 10*3/uL — ABNORMAL LOW (ref 0.7–4.0)
Lymphs Abs: 0.9 10*3/uL (ref 0.7–4.0)
MCH: 35 pg — ABNORMAL HIGH (ref 26.0–34.0)
MCH: 35.5 pg — ABNORMAL HIGH (ref 26.0–34.0)
MCHC: 36.7 g/dL — ABNORMAL HIGH (ref 30.0–36.0)
MCHC: 37.4 g/dL — ABNORMAL HIGH (ref 30.0–36.0)
MCV: 95.5 fL (ref 80.0–100.0)
MCV: 94.8 fL (ref 80.0–100.0)
Monocytes Absolute: 0.4 10*3/uL (ref 0.1–1.0)
Monocytes Absolute: 0.5 10*3/uL (ref 0.1–1.0)
Monocytes Relative: 12 %
Monocytes Relative: 14 %
Neutro Abs: 2.1 10*3/uL (ref 1.7–7.7)
Neutro Abs: 2.2 10*3/uL (ref 1.7–7.7)
Neutrophils Relative %: 66 %
Neutrophils Relative %: 58 %
Platelets: 21 10*3/uL — CL (ref 150–400)
Platelets: UNDETERMINED 10*3/uL (ref 150–400)
RBC: 3.54 MIL/uL — ABNORMAL LOW (ref 4.22–5.81)
RBC: 3.1 MIL/uL — ABNORMAL LOW (ref 4.22–5.81)
RDW: 15.4 % (ref 11.5–15.5)
RDW: 15.4 % (ref 11.5–15.5)
Smear Review: NORMAL
WBC: 3.2 10*3/uL — ABNORMAL LOW (ref 4.0–10.5)
WBC: 3.7 10*3/uL — ABNORMAL LOW (ref 4.0–10.5)
nRBC: 0 % (ref 0.0–0.2)
nRBC: 0 % (ref 0.0–0.2)

## 2021-09-18 LAB — BILIRUBIN, DIRECT: Bilirubin, Direct: 1.3 mg/dL — ABNORMAL HIGH (ref 0.0–0.2)

## 2021-09-18 LAB — BASIC METABOLIC PANEL
Anion gap: 15 (ref 5–15)
BUN: 5 mg/dL — ABNORMAL LOW (ref 6–20)
CO2: 18 mmol/L — ABNORMAL LOW (ref 22–32)
Calcium: 7.2 mg/dL — ABNORMAL LOW (ref 8.9–10.3)
Chloride: 100 mmol/L (ref 98–111)
Creatinine, Ser: 1.01 mg/dL (ref 0.61–1.24)
GFR, Estimated: >60 mL/min (ref >60)
Glucose, Bld: 105 mg/dL — ABNORMAL HIGH (ref 70–99)
Potassium: 2.8 mmol/L — ABNORMAL LOW (ref 3.5–5.1)
Sodium: 133 mmol/L — ABNORMAL LOW (ref 135–145)

## 2021-09-18 LAB — COMPREHENSIVE METABOLIC PANEL
ALT: 28 U/L (ref 0–44)
AST: 107 U/L — ABNORMAL HIGH (ref 15–41)
Albumin: 3.9 g/dL (ref 3.5–5.0)
Alkaline Phosphatase: 86 U/L (ref 38–126)
Anion gap: 28 — ABNORMAL HIGH (ref 5–15)
BUN: 10 mg/dL (ref 6–20)
CO2: 12 mmol/L — ABNORMAL LOW (ref 22–32)
Calcium: 8.4 mg/dL — ABNORMAL LOW (ref 8.9–10.3)
Chloride: 90 mmol/L — ABNORMAL LOW (ref 98–111)
Creatinine, Ser: 1.21 mg/dL (ref 0.61–1.24)
GFR, Estimated: >60 mL/min (ref >60)
Glucose, Bld: 198 mg/dL — ABNORMAL HIGH (ref 70–99)
Potassium: 2.6 mmol/L — CL (ref 3.5–5.1)
Sodium: 130 mmol/L — ABNORMAL LOW (ref 135–145)
Total Bilirubin: 5.4 mg/dL — ABNORMAL HIGH (ref 0.3–1.2)
Total Protein: 7.9 g/dL (ref 6.5–8.1)

## 2021-09-18 LAB — POTASSIUM: Potassium: 2.9 mmol/L — ABNORMAL LOW (ref 3.5–5.1)

## 2021-09-18 LAB — RESP PANEL BY RT-PCR (FLU A&B, COVID) ARPGX2
Influenza A by PCR: NEGATIVE
Influenza B by PCR: NEGATIVE
SARS Coronavirus 2 by RT PCR: NEGATIVE

## 2021-09-18 LAB — URINALYSIS, ROUTINE W REFLEX MICROSCOPIC
Glucose, UA: NEGATIVE mg/dL
Hgb urine dipstick: NEGATIVE
Ketones, ur: >=80 mg/dL — AB
Leukocytes,Ua: NEGATIVE
Nitrite: NEGATIVE
Protein, ur: NEGATIVE mg/dL
Specific Gravity, Urine: 1.01 (ref 1.005–1.030)
pH: 6.5 (ref 5.0–8.0)

## 2021-09-18 LAB — PHOSPHORUS: Phosphorus: <1 mg/dL — CL (ref 2.5–4.6)

## 2021-09-18 LAB — PROTIME-INR
INR: 1.5 — ABNORMAL HIGH (ref 0.8–1.2)
Prothrombin Time: 18 seconds — ABNORMAL HIGH (ref 11.4–15.2)

## 2021-09-18 LAB — LIPASE, BLOOD: Lipase: 55 U/L — ABNORMAL HIGH (ref 11–51)

## 2021-09-18 LAB — MAGNESIUM: Magnesium: 1 mg/dL — ABNORMAL LOW (ref 1.7–2.4)

## 2021-09-18 LAB — AMMONIA: Ammonia: 114 umol/L — ABNORMAL HIGH (ref 9–35)

## 2021-09-18 LAB — OSMOLALITY: Osmolality: 284 mOsm/kg (ref 275–295)

## 2021-09-18 LAB — OSMOLALITY, URINE: Osmolality, Ur: 525 mOsm/kg (ref 300–900)

## 2021-09-18 LAB — SODIUM, URINE, RANDOM: Sodium, Ur: 128 mmol/L

## 2021-09-18 LAB — LACTIC ACID, PLASMA: Lactic Acid, Venous: 1.6 mmol/L (ref 0.5–1.9)

## 2021-09-18 MED ORDER — ONDANSETRON HCL 4 MG PO TABS
4.0000 mg | ORAL_TABLET | Freq: Four times a day (QID) | ORAL | Status: DC | PRN
  Administered 2021-09-22: 4 mg via ORAL
  Filled 2021-09-18: qty 1

## 2021-09-18 MED ORDER — LACTULOSE 10 GM/15ML PO SOLN
30.0000 g | Freq: Once | ORAL | Status: AC
  Administered 2021-09-18: 30 g via ORAL

## 2021-09-18 MED ORDER — ACETAMINOPHEN 325 MG PO TABS
650.0000 mg | ORAL_TABLET | Freq: Once | ORAL | Status: AC
  Administered 2021-09-18: 650 mg via ORAL
  Filled 2021-09-18: qty 2

## 2021-09-18 MED ORDER — FAMOTIDINE 20 MG PO TABS
40.0000 mg | ORAL_TABLET | Freq: Once | ORAL | Status: AC
  Administered 2021-09-18: 40 mg via ORAL
  Filled 2021-09-18: qty 2

## 2021-09-18 MED ORDER — SODIUM CHLORIDE 0.9 % IV BOLUS
1000.0000 mL | Freq: Once | INTRAVENOUS | Status: AC
  Administered 2021-09-18: 1000 mL via INTRAVENOUS

## 2021-09-18 MED ORDER — ALUM & MAG HYDROXIDE-SIMETH 200-200-20 MG/5ML PO SUSP
30.0000 mL | Freq: Once | ORAL | Status: AC
  Administered 2021-09-18: 30 mL via ORAL
  Filled 2021-09-18: qty 30

## 2021-09-18 MED ORDER — ALBUTEROL SULFATE (2.5 MG/3ML) 0.083% IN NEBU
2.5000 mg | INHALATION_SOLUTION | Freq: Four times a day (QID) | RESPIRATORY_TRACT | Status: DC
  Administered 2021-09-18: 2.5 mg via RESPIRATORY_TRACT
  Filled 2021-09-18: qty 3

## 2021-09-18 MED ORDER — IOHEXOL 300 MG/ML  SOLN
100.0000 mL | Freq: Once | INTRAMUSCULAR | Status: AC | PRN
  Administered 2021-09-18: 100 mL via INTRAVENOUS

## 2021-09-18 MED ORDER — SODIUM CHLORIDE 0.9 % IV SOLN: INTRAVENOUS | Status: DC

## 2021-09-18 MED ORDER — POTASSIUM CHLORIDE 10 MEQ/100ML IV SOLN
10.0000 meq | INTRAVENOUS | Status: AC
  Administered 2021-09-18 (×5): 10 meq via INTRAVENOUS
  Filled 2021-09-18 (×5): qty 100

## 2021-09-18 MED ORDER — HYDRALAZINE HCL 20 MG/ML IJ SOLN: 10.0000 mg | Freq: Four times a day (QID) | INTRAMUSCULAR | Status: DC | PRN

## 2021-09-18 MED ORDER — SENNA 8.6 MG PO TABS
1.0000 | ORAL_TABLET | Freq: Two times a day (BID) | ORAL | Status: DC
  Administered 2021-09-18: 8.6 mg via ORAL
  Filled 2021-09-18: qty 1

## 2021-09-18 MED ORDER — MAGNESIUM OXIDE -MG SUPPLEMENT 400 (240 MG) MG PO TABS
400.0000 mg | ORAL_TABLET | Freq: Every day | ORAL | Status: DC
  Administered 2021-09-19 – 2021-09-24 (×6): 400 mg via ORAL
  Filled 2021-09-18 (×6): qty 1

## 2021-09-18 MED ORDER — LACTULOSE 10 GM/15ML PO SOLN
30.0000 g | Freq: Two times a day (BID) | ORAL | Status: DC
  Administered 2021-09-18 – 2021-09-19 (×2): 30 g via ORAL
  Filled 2021-09-18 (×2): qty 45

## 2021-09-18 MED ORDER — MAGNESIUM SULFATE 2 GM/50ML IV SOLN
2.0000 g | Freq: Once | INTRAVENOUS | Status: AC
  Administered 2021-09-18: 2 g via INTRAVENOUS
  Filled 2021-09-18: qty 50

## 2021-09-18 MED ORDER — THIAMINE HCL 100 MG PO TABS
100.0000 mg | ORAL_TABLET | Freq: Every day | ORAL | Status: DC
  Administered 2021-09-18 – 2021-09-24 (×7): 100 mg via ORAL
  Filled 2021-09-18 (×7): qty 1

## 2021-09-18 MED ORDER — ACETAMINOPHEN 325 MG PO TABS
650.0000 mg | ORAL_TABLET | Freq: Four times a day (QID) | ORAL | Status: DC | PRN
  Administered 2021-09-20 – 2021-09-22 (×2): 650 mg via ORAL
  Filled 2021-09-18 (×2): qty 2

## 2021-09-18 MED ORDER — POTASSIUM CHLORIDE CRYS ER 20 MEQ PO TBCR
40.0000 meq | EXTENDED_RELEASE_TABLET | ORAL | Status: AC
  Administered 2021-09-18 (×2): 40 meq via ORAL
  Filled 2021-09-18 (×2): qty 2

## 2021-09-18 MED ORDER — PANTOPRAZOLE SODIUM 40 MG PO TBEC
40.0000 mg | DELAYED_RELEASE_TABLET | Freq: Every day | ORAL | Status: DC
  Administered 2021-09-18 – 2021-09-21 (×4): 40 mg via ORAL
  Filled 2021-09-18 (×4): qty 1

## 2021-09-18 MED ORDER — POTASSIUM PHOSPHATES 15 MMOLE/5ML IV SOLN
30.0000 mmol | Freq: Once | INTRAVENOUS | Status: AC
  Administered 2021-09-18: 30 mmol via INTRAVENOUS
  Filled 2021-09-18: qty 10

## 2021-09-18 MED ORDER — SODIUM CHLORIDE 0.45 % IV SOLN
INTRAVENOUS | Status: DC
  Filled 2021-09-18 (×3): qty 75

## 2021-09-18 MED ORDER — ONDANSETRON HCL 4 MG/2ML IJ SOLN
4.0000 mg | Freq: Once | INTRAMUSCULAR | Status: AC
  Administered 2021-09-18: 4 mg via INTRAVENOUS
  Filled 2021-09-18: qty 2

## 2021-09-18 MED ORDER — ONDANSETRON HCL 4 MG/2ML IJ SOLN
4.0000 mg | Freq: Four times a day (QID) | INTRAMUSCULAR | Status: DC | PRN
  Administered 2021-09-23: 4 mg via INTRAVENOUS
  Filled 2021-09-18: qty 2

## 2021-09-18 MED ORDER — FOLIC ACID 1 MG PO TABS
1.0000 mg | ORAL_TABLET | Freq: Every day | ORAL | Status: DC
  Administered 2021-09-18 – 2021-09-24 (×7): 1 mg via ORAL
  Filled 2021-09-18 (×7): qty 1

## 2021-09-18 MED ORDER — ACETAMINOPHEN 650 MG RE SUPP: 650.0000 mg | Freq: Four times a day (QID) | RECTAL | Status: DC | PRN

## 2021-09-18 NOTE — ED Notes (Signed)
Lab notified of mag add on 

## 2021-09-18 NOTE — ED Notes (Signed)
Pt on BSC having BM due to Lactulose.  Pt states he is feeling OK.  Introduced to patient and BSC emptied.  Pt given small amount of water for his mouth swab.  Pt awaiting bed placement.

## 2021-09-18 NOTE — Progress Notes (Signed)
Pt arrived via PTAR to 4E19 from [facility] with pancytopenia, and elevated ammonia. Telemetry monitor applied and CCMD notified.  CHG bath and skin assessment completed.  Patient oriented to unit and room to include call light and phone.  Call light within reach.  All needs addressed.

## 2021-09-18 NOTE — H&P (Signed)
[facility] History and Physical  Alessandro Griep DVV:[id number] DOB: [birth year] DOA: 09/18/2021 PCP: [name], DO  Admitted from: Home Chief Complaint: Progressive weakness  History of Present Illness: Juan [name] is a 56 y.o. male with PMH significant for chronic alcoholism, liver cirrhosis, muscular dystrophy HTN, ulcerative colitis, DVT, traumatic hemorrhagic shock, anxiety, depression, PTSD, chronic fatigue. Patient presented to the ED last night with complaint of worsening fatigue. On 1/19, patient tested positive for flu B at PCPs office and was started on a course of Tamiflu. His fever subsided but continued to have weakness, anorexia.   He lives at home with his wife.  Last year, he was diagnosed with muscular dystrophy, states his neurologist at [facility] thinks it is related to alcoholism.  He quit drinking alcohol few months ago.  At home, uses a walker and a wheelchair. He and his family were aware he had fatty liver but not aware of liver cirrhosis.  On chart review it seems that CT scan of abdomen from August 2022 had mention liver cirrhosis.  Patient has a high screening colonoscopy in near future, has not had any EGD.  No history of GI blood loss.  In the ED, patient was afebrile, heart rate 111, blood pressure 97/64, breathing on room air Labs abnormal with sodium low at 130, potassium low at 2.6, serum bicarb low at 12, glucose elevated to 198, creatinine at 1.21, magnesium low at 1, lipase slightly up at 55, AST elevated to 107, total bilirubin elevated to 5.4, ammonia elevated to 114 WBC count low at 3.2, hemoglobin at 12.4 with MCV 96, platelets low at 21 Urinalysis with clear yellow urine, more than 80 ketones CT abdomen pelvis showed liver cirrhosis with portal hypertension, cholelithiasis.  At the time of my evaluation, patient was lying on bed.  Not in distress.  In the ER, he got lactulose and had a bowel movement after which he feels good. Wife  available on the phone.  Review of Systems:  All systems were reviewed and were negative unless otherwise mentioned in the HPI   Past medical history: Past Medical History:  Diagnosis Date   Alcohol addiction ([place])    Anxiety    Chronic fatigue    Colon polyps    Depression    GERD (gastroesophageal reflux disease)    Hemochromatosis    Hx of blood clots    Leg   Hyperreflexia    Hypertension    PTSD (post-traumatic stress disorder)    Traumatic hemorrhagic shock ([facility])    Ulcerative colitis ([place])     Past surgical history: Past Surgical History:  Procedure Laterality Date   CARPAL TUNNEL RELEASE Bilateral    FRACTURE SURGERY     left ankle plate   HERNIA REPAIR     inguinal   KNEE SURGERY Right    x 4   SHOULDER SURGERY Bilateral    x 2   VASECTOMY      Social History:  reports that he has never smoked. His smokeless tobacco use includes snuff. He reports that he does not currently use alcohol. He reports that he does not use drugs.  Allergies:  Allergies  Allergen Reactions   Charentais Melon (French Melon) Anaphylaxis   Cucumber Extract Itching and Nausea And Vomiting    No extracts; just cucumber    Other Anaphylaxis   Peanut Butter Flavor Anaphylaxis and Swelling   Peanut Oil Swelling   Shellfish Allergy Itching and Swelling   Cantaloupe Extract Allergy Skin  Test Rash   Lactose Other (See Comments)   Strawberry Extract Nausea And Vomiting and Swelling   Vancomycin Rash   Apple Swelling   Codeine     itching   Depakote Er [Divalproex Sodium Er]     Tongue swelling    Family history:  Family History  Problem Relation Age of Onset   Pulmonary fibrosis Mother    Other Father        liver failure   Breast cancer Paternal Aunt      Home Meds: Prior to Admission medications   Medication Sig Start Date End Date Taking? Authorizing Provider  amLODipine (NORVASC) 2.5 MG tablet Take 1 tablet (2.5 mg total) by mouth daily. 06/02/21   [name], DO  aspirin 81 MG chewable tablet Chew 81 mg by mouth daily.    [provider]  B Complex Vitamins (B COMPLEX PO) Take by mouth daily.    [provider]  busPIRone (BUSPAR) 15 MG tablet TAKE ONE TABLET BY MOUTH TWICE A DAY TAKE AT 9 EVERY MORNING AND 6 EVERY EVENING 08/27/21   [provider]  Cholecalciferol 50 MCG (2000 UT) CAPS Take 1 capsule by mouth daily.    [provider]  clotrimazole-betamethasone (LOTRISONE) cream Apply 1 application topically 2 (two) times daily. 05/12/21   Silverio Decamp, MD  EPINEPHrine 0.3 mg/0.3 mL IJ SOAJ injection INJECT 0.3 MG INTO THE MUSCLE AS NEEDED FOR ANAPHYLAXIS. 04/08/21 04/08/22  Hali Marry, MD  folic acid (FOLVITE) 1 MG tablet TAKE 1 TABLET (1 MG TOTAL) BY MOUTH DAILY. 06/22/21 06/22/22  Silverio Decamp, MD  Magnesium Oxide 400 (240 Mg) MG TABS Take 240 mg by mouth.    [provider]  Multiple Vitamins-Minerals (YOUR LIFE MULTI ADULT GUMMIES) CHEW Chew 1 tablet by mouth daily.    [provider]  nicotine (NICODERM CQ - DOSED IN MG/24 HR) 7 mg/24hr patch APPLY 1 PATCH TO SKIN ONCE A DAY *APPLY TO NON-HAIRY, CLEAN, DRY AREA (NO TOBACCO PRODUCTS)* FOR 2 WEEKS - BEGIN AFTER 14MG PATCHES COMPLETE 04/29/21   [provider]  ondansetron (ZOFRAN-ODT) 8 MG disintegrating tablet Dissolve 1 tablet (8 mg total) by mouth every 8 (eight) hours as needed for nausea. 07/14/21   Terrilyn Saver, NP  pantoprazole (PROTONIX) 40 MG tablet TAKE 1 TABLET (40 MG TOTAL) BY MOUTH DAILY. 06/22/21 06/22/22  Silverio Decamp, MD  prazosin (MINIPRESS) 2 MG capsule TAKE TWO CAPSULES BY MOUTH AT BEDTIME FOR DISRUPTED SLEEP/VIVID DREAMS 05/28/21   [provider]  saccharomyces boulardii (FLORASTOR) 250 MG capsule Take 250 mg by mouth 2 (two) times daily.     [provider]  thiamine 100 MG tablet Take 100 mg by mouth daily. 11/14/19   [provider]  vortioxetine  HBr (TRINTELLIX) 10 MG TABS tablet Take 1 tablet (10 mg total) by mouth daily. 09/02/21   [name], DO    Physical Exam: Vitals:   09/18/21 1238 09/18/21 1430 09/18/21 1515 09/18/21 1639  BP:  (!) 86/48 117/70 113/74  Pulse:    64  Resp:  13 16 16   Temp:    98.1 F (36.7 C)  TempSrc:    Oral  SpO2: 96%  98% 100%  Weight:    74.5 kg  Height:    5' 5"  (1.651 m)   Wt Readings from Last 3 Encounters:  09/18/21 74.5 kg  09/02/21 73.9 kg  07/30/21 78.5 kg   Body mass index is  27.33 kg/m.  General exam: Pleasant, middle-aged Caucasian male.  Looks older for his age Skin: No rashes, lesions or ulcers. HEENT: Atraumatic, normocephalic, no obvious bleeding Lungs: Clear to auscultation bilaterally CVS: Regular rate and rhythm, no murmur GI/Abd soft, nontender, nondistended, bowel sound present CNS: Alert, awake, oriented x3, no alcoholic tremors Psychiatry: Sad affect Extremities: No pedal edema, no calf tenderness     Consult Orders  (From admission, onward)           Start     Ordered   09/18/21 1727  PT eval and treat  Routine        09/18/21 1726   09/18/21 0252  Consult to hospitalist  Called [facility] and spoke to [place] @0255   Once       Provider:  (Not yet assigned)  Question Answer Comment  Place call to: [facility]   Reason for Consult Admit      09/18/21 0251            Labs on Admission:   CBC: Recent Labs  Lab 09/18/21 0057  WBC 3.2*  NEUTROABS 2.1  HGB 12.4*  HCT 33.8*  MCV 95.5  PLT 21*    Basic Metabolic Panel: Recent Labs  Lab 09/18/21 0057 09/18/21 0205  NA 130*  --   K 2.6* 2.9*  CL 90*  --   CO2 12*  --   GLUCOSE 198*  --   BUN 10  --   CREATININE 1.21  --   CALCIUM 8.4*  --   MG 1.0*  --     Liver Function Tests: Recent Labs  Lab 09/18/21 0057  AST 107*  ALT 28  ALKPHOS 86  BILITOT 5.4*  PROT 7.9  ALBUMIN 3.9   Recent Labs  Lab 09/18/21 0057  LIPASE 55*   Recent Labs  Lab 09/18/21 0152   AMMONIA 114*    Cardiac Enzymes: No results for input(s): CKTOTAL, CKMB, CKMBINDEX, TROPONINI in the last 168 hours.  BNP (last 3 results) No results for input(s): BNP in the last 8760 hours.  ProBNP (last 3 results) No results for input(s): PROBNP in the last 8760 hours.  CBG: No results for input(s): GLUCAP in the last 168 hours.  Lipase     Component Value Date/Time   LIPASE 55 (H) 09/18/2021 0057     Urinalysis    Component Value Date/Time   COLORURINE YELLOW 09/18/2021 0302   APPEARANCEUR CLEAR 09/18/2021 0302   LABSPEC 1.010 09/18/2021 0302   PHURINE 6.5 09/18/2021 0302   GLUCOSEU NEGATIVE 09/18/2021 0302   HGBUR NEGATIVE 09/18/2021 0302   BILIRUBINUR MODERATE (A) 09/18/2021 0302   BILIRUBINUR small (A) 04/17/2021 1646   KETONESUR >=80 (A) 09/18/2021 0302   PROTEINUR NEGATIVE 09/18/2021 0302   UROBILINOGEN 4.0 (A) 04/17/2021 1646   NITRITE NEGATIVE 09/18/2021 0302   LEUKOCYTESUR NEGATIVE 09/18/2021 0302     Drugs of Abuse  No results found for: LABOPIA, COCAINSCRNUR, LABBENZ, AMPHETMU, THCU, LABBARB    Radiological Exams on Admission: CT ABDOMEN PELVIS W CONTRAST  Result Date: 09/18/2021 CLINICAL DATA:  Abdominal pain EXAM: CT ABDOMEN AND PELVIS WITH CONTRAST TECHNIQUE: Multidetector CT imaging of the abdomen and pelvis was performed using the standard protocol following bolus administration of intravenous contrast. RADIATION DOSE REDUCTION: This exam was performed according to the departmental dose-optimization program which includes automated exposure control, adjustment of the mA and/or kV according to patient size and/or use of iterative reconstruction technique. CONTRAST:  152m OMNIPAQUE IOHEXOL 300 MG/ML  SOLN COMPARISON:  CT abdomen pelvis dated 04/17/2021. FINDINGS: Lower chest: The visualized lung bases are clear. There is coronary vascular calcification. No intra-abdominal free air or free fluid. Hepatobiliary: Cirrhosis. Innumerable small hypodense  nodules are not characterized, possibly regenerative nodules. These can be better evaluated with MRI on a nonemergent/outpatient basis. No intrahepatic biliary dilatation. Layering sludge and small stones within the gallbladder. No pericholecystic fluid or evidence of acute cholecystitis by CT. Pancreas: Unremarkable. No pancreatic ductal dilatation or surrounding inflammatory changes. Spleen: Normal in size without focal abnormality. Adrenals/Urinary Tract: The adrenal glands are unremarkable there is no hydronephrosis on either side. There is symmetric enhancement and excretion of contrast by both kidneys. The visualized ureters and urinary bladder appear unremarkable. Stomach/Bowel: There is a small hiatal hernia. There is no bowel obstruction or active inflammation. The appendix is normal. Vascular/Lymphatic: Mild aortoiliac atherosclerotic disease. The IVC is unremarkable. There is recanalization of the umbilical vein as well as small anterior abdominal vascular collaterals. No portal venous gas. There is no adenopathy. Reproductive: The prostate and seminal vesicles are grossly unremarkable. No pelvic mass. Other: Small fat containing umbilical hernia. Musculoskeletal: Avascular necrosis of the right femoral head. No acute osseous pathology. IMPRESSION: 1. No acute intra-abdominal or pelvic pathology. 2. Cirrhosis with evidence of portal hypertension. 3. Cholelithiasis. 4. Aortic Atherosclerosis (ICD10-I70.0). Electronically Signed   By: Anner Crete M.D.   On: 09/18/2021 02:28   DG Chest Portable 1 View  Result Date: 09/18/2021 CLINICAL DATA:  Nausea and vomiting. EXAM: PORTABLE CHEST 1 VIEW COMPARISON:  Chest x-ray 08/13/2020 FINDINGS: The heart size and mediastinal contours are within normal limits. Both lungs are clear. The visualized skeletal structures are unremarkable. IMPRESSION: No active disease. Electronically Signed   By: Ronney Asters M.D.   On: 09/18/2021 02:16   US Abdomen Limited RUQ  (LIVER/GB)  Result Date: 09/18/2021 CLINICAL DATA:  Right upper quadrant pain with nausea and vomiting EXAM: ULTRASOUND ABDOMEN LIMITED RIGHT UPPER QUADRANT COMPARISON:  08/13/2020 FINDINGS: Gallbladder: Sludge and tiny stones within the gallbladder. Gallbladder wall thickness of approximately 4 mm. No sonographic Murphy sign noted by sonographer. Common bile duct: Diameter: 3 mm. Liver: Markedly echogenic liver with coarsened echotexture. Liver echotexture results in poor penetration with limited visualization of much of the liver parenchyma. No obvious focal liver lesions are detected. Slightly nodular hepatic surface contour. Portal vein is patent on color Doppler imaging with normal direction of blood flow towards the liver. Other: None. IMPRESSION: 1. Echogenic liver with coarsened echotexture and nodular surface contour suggestive of cirrhosis. Evaluation of the liver parenchyma is limited by poor penetration. No obvious liver lesions identified. 2. Cholelithiasis and mild diffuse gallbladder wall thickening. Wall thickening could be secondary to underlying liver disease versus inflammation. No sonographic Percell Miller sign was evident on exam. If there is high clinical suspicion for cholecystitis, a nuclear medicine hepatobiliary scan may be helpful to further assess. Electronically Signed   By: Davina Poke D.O.   On: 09/18/2021 09:53     ------------------------------------------------------------------------------------------------------ Assessment/Plan: Principal Problem:   Pancytopenia ([place])  Liver cirrhosis with portal hypertension Hyperammonemia -History of chronic alcoholism -Presented with progressive fatigue, increased ammonia level, increased total bilirubin, slightly elevated liver enzymes.  No mention of ascites. -Obtain direct bilirubin, INR.  Continue to monitor LFTs -Start on lactulose Recent Labs  Lab 09/18/21 0057  AST 107*  ALT 28  ALKPHOS 86  BILITOT 5.4*  PROT 7.9   ALBUMIN 3.9   No results for input(s): INR in the last  168 hours.  Recent Labs  Lab 09/18/21 0152  AMMONIA 114*    Hypokalemia/hypomagnesemia -Severely low potassium and magnesium level.  Oral and IV replacement ordered -Obtain phosphorus level as well Recent Labs  Lab 09/18/21 0057 09/18/21 0205  K 2.6* 2.9*  MG 1.0*  --    Acute anion gap metabolic acidosis -Serum bicarb level low at 12 with anion gap elevated to 28.  Creatinine higher side of normal. -Obtain lactic acid level.  Start on sodium bicarb drip. Recent Labs    10/30/20 1337 01/06/21 0000 04/06/21 1822 04/08/21 0000 04/17/21 0000 09/18/21 0057  BUN 6* 8 9 19 12 10   CREATININE 0.76 0.82 1.00 1.04 0.88 1.21  CO2 29 30 23 25 24  12*  No results for input(s): LATICACIDVEN in the last 168 hours.  Hyponatremia -No evidence of hypervolemia at this time.  Probably related to low oral intake. -Continue to trend Recent Labs  Lab 09/18/21 0057  NA 130*   Thrombocytopenia -Likely due to liver disease -Avoid heparin products for DVT prophylaxis at this time Recent Labs  Lab 09/18/21 0057  PLT 21*   Hyperglycemia -A1c 5.1.  No history of diabetes mellitus -Blood sugar level elevated to 198 on arrival.  Continue to monitor.  Essential hypertension -Blood pressure in low normal range.  Keep home meds on hold  Depression -Continue home meds  Recent flu B infection -Completed 5-day course of Tamiflu.  Currently does not have any respiratory symptoms. -Continue supportive care  Impaired mobility Muscular dystrophy -At baseline, patient uses walker and wheelchair because of muscular dystrophy -PT eval ordered.  He is expecting to go to CIR  Code Status:   Code Status: Full Code  DVT prophylaxis: SCDs Antimicrobials: None Fluid: Sodium bicarb at 125 mill per hour  Diet:  Diet Order             Diet regular Room service appropriate? Yes; Fluid consistency: Thin  Diet effective now                    Consultants: None Family Communication: Spoke with his wife on the phone  Dispo: The patient is from: Home              Anticipated d/c is to: Pending PT eval              Anticipated d/c date is: > 3 days  ------------------------------------------------------------------------------------- Severity of Illness: The appropriate patient status for this patient is INPATIENT. Inpatient status is judged to be reasonable and necessary in order to provide the required intensity of service to ensure the patient's safety. The patient's presenting symptoms, physical exam findings, and initial radiographic and laboratory data in the context of their chronic comorbidities is felt to place them at high risk for further clinical deterioration. Furthermore, it is not anticipated that the patient will be medically stable for discharge from the hospital within 2 midnights of admission.   * I certify that at the point of admission it is my clinical judgment that the patient will require inpatient hospital care spanning beyond 2 midnights from the point of admission due to high intensity of service, high risk for further deterioration and high frequency of surveillance required.*  -------------------------------------------------------------------------------------  Cline Cools, MD [facility] 09/18/2021

## 2021-09-18 NOTE — ED Notes (Signed)
Repeat K+ per MD request

## 2021-09-18 NOTE — ED Notes (Signed)
Patient transported to CT 

## 2021-09-18 NOTE — ED Triage Notes (Signed)
C/o n/v x 4 days 

## 2021-09-18 NOTE — ED Notes (Signed)
Pt c/o heart burn // MD made aware

## 2021-09-18 NOTE — ED Notes (Addendum)
Pt admits to having BM x4 after taking lactulose

## 2021-09-18 NOTE — ED Provider Notes (Addendum)
[facility] EMERGENCY DEPARTMENT Provider Note   CSN: [id number] Arrival date & time: 09/18/21  0007     History  Chief Complaint  Patient presents with   Vomiting    Juan [name] is a 56 y.o. male.  The history is provided by the patient.  Emesis Severity:  Severe Duration:  1 day Timing:  Intermittent Number of daily episodes:  >10 Quality:  Stomach contents Progression:  Unchanged Chronicity:  New Context: not post-tussive   Relieved by:  Nothing Worsened by:  Nothing Ineffective treatments: none. Associated symptoms: fever   Associated symptoms: no abdominal pain, no cough, no diarrhea, no myalgias, no sore throat and no URI   Associated symptoms comment:  Had a fever 4 days ago but not since  Risk factors: no diabetes, no suspect food intake and no travel to endemic areas   Patient with a history of hypertension and depression presents with 1 days of > 10 episodes of nausea and vomiting.  He reports having a fever 4 days ago but none since.  No diarrhea, no abdominal pain. No urinary symptoms.  No cough, congestion, or body aches.      Home Medications Prior to Admission medications   Medication Sig Start Date End Date Taking? Authorizing Provider  amLODipine (NORVASC) 2.5 MG tablet Take 1 tablet (2.5 mg total) by mouth daily. 06/02/21   Everrett Coombe, DO  aspirin 81 MG chewable tablet Chew 81 mg by mouth daily.    [provider]  B Complex Vitamins (B COMPLEX PO) Take by mouth daily.    [provider]  busPIRone (BUSPAR) 15 MG tablet TAKE ONE TABLET BY MOUTH TWICE A DAY TAKE AT 9 EVERY MORNING AND 6 EVERY EVENING 08/27/21   [provider]  Cholecalciferol 50 MCG (2000 UT) CAPS Take 1 capsule by mouth daily.    [provider]  clotrimazole-betamethasone (LOTRISONE) cream Apply 1 application topically 2 (two) times daily. 05/12/21   Monica Becton, MD  EPINEPHrine 0.3 mg/0.3 mL IJ SOAJ injection INJECT 0.3 MG  INTO THE MUSCLE AS NEEDED FOR ANAPHYLAXIS. 04/08/21 04/08/22  Agapito Games, MD  folic acid (FOLVITE) 1 MG tablet TAKE 1 TABLET (1 MG TOTAL) BY MOUTH DAILY. 06/22/21 06/22/22  Monica Becton, MD  Magnesium Oxide 400 (240 Mg) MG TABS Take 240 mg by mouth.    [provider]  Multiple Vitamins-Minerals (YOUR LIFE MULTI ADULT GUMMIES) CHEW Chew 1 tablet by mouth daily.    [provider]  nicotine (NICODERM CQ - DOSED IN MG/24 HR) 7 mg/24hr patch APPLY 1 PATCH TO SKIN ONCE A DAY *APPLY TO NON-HAIRY, CLEAN, DRY AREA (NO TOBACCO PRODUCTS)* FOR 2 WEEKS - BEGIN AFTER 14MG  PATCHES COMPLETE 04/29/21   [provider]  ondansetron (ZOFRAN-ODT) 8 MG disintegrating tablet Dissolve 1 tablet (8 mg total) by mouth every 8 (eight) hours as needed for nausea. 07/14/21   Clayborne Dana, NP  pantoprazole (PROTONIX) 40 MG tablet TAKE 1 TABLET (40 MG TOTAL) BY MOUTH DAILY. 06/22/21 06/22/22  Monica Becton, MD  prazosin (MINIPRESS) 2 MG capsule TAKE TWO CAPSULES BY MOUTH AT BEDTIME FOR DISRUPTED SLEEP/VIVID DREAMS 05/28/21   [provider]  saccharomyces boulardii (FLORASTOR) 250 MG capsule Take 250 mg by mouth 2 (two) times daily.     [provider]  thiamine 100 MG tablet Take 100 mg by mouth daily. 11/14/19   [provider]  vortioxetine HBr (TRINTELLIX) 10 MG TABS tablet Take 1  tablet (10 mg total) by mouth daily. 09/02/21   Everrett Coombe, DO      Allergies    Charentais melon (french melon), Cucumber extract, Other, Peanut butter flavor, Peanut oil, Shellfish allergy, Cantaloupe extract allergy skin test, Lactose, Strawberry extract, Vancomycin, Apple, Codeine, and Depakote er [divalproex sodium er]    Review of Systems   Review of Systems  Constitutional:  Positive for fatigue and fever.  HENT:  Negative for sore throat.   Eyes:  Negative for redness.  Respiratory:  Negative for cough.   Gastrointestinal:  Positive for vomiting.  Negative for abdominal pain and diarrhea.  Genitourinary:  Negative for dysuria.  Musculoskeletal:  Negative for myalgias.  Skin:  Negative for rash.  Neurological:  Negative for facial asymmetry.  All other systems reviewed and are negative.  Physical Exam Updated Vital Signs BP 121/88    Pulse 94    Temp 98.5 F (36.9 C)    Resp 14    Ht 5' (1.524 m)    Wt 73.9 kg    SpO2 97%    BMI 31.83 kg/m  Physical Exam Vitals and nursing note reviewed. Exam conducted with a chaperone present.  Constitutional:      Appearance: Normal appearance. He is not diaphoretic.  HENT:     Head: Normocephalic and atraumatic.     Nose: Nose normal.  Eyes:     Extraocular Movements: Extraocular movements intact.     Pupils: Pupils are equal, round, and reactive to light.     Comments: Icterus   Cardiovascular:     Rate and Rhythm: Regular rhythm. Tachycardia present.     Pulses: Normal pulses.     Heart sounds: Normal heart sounds.  Pulmonary:     Effort: Pulmonary effort is normal.     Breath sounds: Normal breath sounds.  Abdominal:     General: Bowel sounds are normal.     Palpations: Abdomen is soft.     Tenderness: There is no abdominal tenderness. There is no guarding or rebound.  Musculoskeletal:        General: Normal range of motion.     Cervical back: Normal range of motion and neck supple.     Right lower leg: No edema.     Left lower leg: No edema.  Skin:    General: Skin is warm and dry.     Capillary Refill: Capillary refill takes less than 2 seconds.     Coloration: Skin is jaundiced.  Neurological:     General: No focal deficit present.     Mental Status: He is alert and oriented to person, place, and time.     Deep Tendon Reflexes: Reflexes normal.  Psychiatric:        Mood and Affect: Mood normal.        Behavior: Behavior normal.    ED Results / Procedures / Treatments   Labs (all labs ordered are listed, but only abnormal results are displayed) Results for orders  placed or performed during the hospital encounter of 09/18/21  Resp Panel by RT-PCR (Flu A&B, Covid) Nasopharyngeal Swab   Specimen: Nasopharyngeal Swab; Nasopharyngeal(NP) swabs in vial transport medium  Result Value Ref Range   SARS Coronavirus 2 by RT PCR NEGATIVE NEGATIVE   Influenza A by PCR NEGATIVE NEGATIVE   Influenza B by PCR NEGATIVE NEGATIVE  CBC with Differential/Platelet  Result Value Ref Range   WBC 3.2 (L) 4.0 - 10.5 K/uL   RBC 3.54 (L) 4.22 - 5.81  MIL/uL   Hemoglobin 12.4 (L) 13.0 - 17.0 g/dL   HCT 59.9 (L) 35.7 - 01.7 %   MCV 95.5 80.0 - 100.0 fL   MCH 35.0 (H) 26.0 - 34.0 pg   MCHC 36.7 (H) 30.0 - 36.0 g/dL   RDW 79.3 90.3 - 00.9 %   Platelets 21 (LL) 150 - 400 K/uL   nRBC 0.0 0.0 - 0.2 %   Neutrophils Relative % 66 %   Neutro Abs 2.1 1.7 - 7.7 K/uL   Lymphocytes Relative 20 %   Lymphs Abs 0.6 (L) 0.7 - 4.0 K/uL   Monocytes Relative 12 %   Monocytes Absolute 0.4 0.1 - 1.0 K/uL   Eosinophils Relative 1 %   Eosinophils Absolute 0.0 0.0 - 0.5 K/uL   Basophils Relative 1 %   Basophils Absolute 0.0 0.0 - 0.1 K/uL   WBC Morphology MORPHOLOGY UNREMARKABLE    RBC Morphology MORPHOLOGY UNREMARKABLE    Smear Review Normal platelet morphology    Immature Granulocytes 0 %   Abs Immature Granulocytes 0.01 0.00 - 0.07 K/uL  Comprehensive metabolic panel  Result Value Ref Range   Sodium 130 (L) 135 - 145 mmol/L   Potassium 2.6 (LL) 3.5 - 5.1 mmol/L   Chloride 90 (L) 98 - 111 mmol/L   CO2 12 (L) 22 - 32 mmol/L   Glucose, Bld 198 (H) 70 - 99 mg/dL   BUN 10 6 - 20 mg/dL   Creatinine, Ser 2.33 0.61 - 1.24 mg/dL   Calcium 8.4 (L) 8.9 - 10.3 mg/dL   Total Protein 7.9 6.5 - 8.1 g/dL   Albumin 3.9 3.5 - 5.0 g/dL   AST 007 (H) 15 - 41 U/L   ALT 28 0 - 44 U/L   Alkaline Phosphatase 86 38 - 126 U/L   Total Bilirubin 5.4 (H) 0.3 - 1.2 mg/dL   GFR, Estimated >62 >26 mL/min   Anion gap 28 (H) 5 - 15  Lipase, blood  Result Value Ref Range   Lipase 55 (H) 11 - 51 U/L   Magnesium  Result Value Ref Range   Magnesium 1.0 (L) 1.7 - 2.4 mg/dL  Ammonia  Result Value Ref Range   Ammonia 114 (H) 9 - 35 umol/L  Potassium  Result Value Ref Range   Potassium 2.9 (L) 3.5 - 5.1 mmol/L   CT ABDOMEN PELVIS W CONTRAST  Result Date: 09/18/2021 CLINICAL DATA:  Abdominal pain EXAM: CT ABDOMEN AND PELVIS WITH CONTRAST TECHNIQUE: Multidetector CT imaging of the abdomen and pelvis was performed using the standard protocol following bolus administration of intravenous contrast. RADIATION DOSE REDUCTION: This exam was performed according to the departmental dose-optimization program which includes automated exposure control, adjustment of the mA and/or kV according to patient size and/or use of iterative reconstruction technique. CONTRAST:  OMNIPAQUE IOHEXOL 300 MG/ML  SOLN COMPARISON:  CT abdomen pelvis dated 04/17/2021. FINDINGS: Lower chest: The visualized lung bases are clear. There is coronary vascular calcification. No intra-abdominal free air or free fluid. Hepatobiliary: Cirrhosis. Innumerable small hypodense nodules are not characterized, possibly regenerative nodules. These can be better evaluated with MRI on a nonemergent/outpatient basis. No intrahepatic biliary dilatation. Layering sludge and small stones within the gallbladder. No pericholecystic fluid or evidence of acute cholecystitis by CT. Pancreas: Unremarkable. No pancreatic ductal dilatation or surrounding inflammatory changes. Spleen: Normal in size without focal abnormality. Adrenals/Urinary Tract: The adrenal glands are unremarkable there is no hydronephrosis on either side. There is symmetric enhancement and excretion of contrast  by both kidneys. The visualized ureters and urinary bladder appear unremarkable. Stomach/Bowel: There is a small hiatal hernia. There is no bowel obstruction or active inflammation. The appendix is normal. Vascular/Lymphatic: Mild aortoiliac atherosclerotic disease. The IVC is  unremarkable. There is recanalization of the umbilical vein as well as small anterior abdominal vascular collaterals. No portal venous gas. There is no adenopathy. Reproductive: The prostate and seminal vesicles are grossly unremarkable. No pelvic mass. Other: Small fat containing umbilical hernia. Musculoskeletal: Avascular necrosis of the right femoral head. No acute osseous pathology. IMPRESSION: 1. No acute intra-abdominal or pelvic pathology. 2. Cirrhosis with evidence of portal hypertension. 3. Cholelithiasis. 4. Aortic Atherosclerosis (ICD10-I70.0). Electronically Signed   By: Elgie CollardArash  Radparvar M.D.   On: 09/18/2021 02:28   DG Chest Portable 1 View  Result Date: 09/18/2021 CLINICAL DATA:  Nausea and vomiting. EXAM: PORTABLE CHEST 1 VIEW COMPARISON:  Chest x-ray 08/13/2020 FINDINGS: The heart size and mediastinal contours are within normal limits. Both lungs are clear. The visualized skeletal structures are unremarkable. IMPRESSION: No active disease. Electronically Signed   By: Darliss CheneyAmy  Guttmann M.D.   On: 09/18/2021 02:16    EKG None  Radiology CT ABDOMEN PELVIS W CONTRAST  Result Date: 09/18/2021 CLINICAL DATA:  Abdominal pain EXAM: CT ABDOMEN AND PELVIS WITH CONTRAST TECHNIQUE: Multidetector CT imaging of the abdomen and pelvis was performed using the standard protocol following bolus administration of intravenous contrast. RADIATION DOSE REDUCTION: This exam was performed according to the departmental dose-optimization program which includes automated exposure control, adjustment of the mA and/or kV according to patient size and/or use of iterative reconstruction technique. CONTRAST:  100mL OMNIPAQUE IOHEXOL 300 MG/ML  SOLN COMPARISON:  CT abdomen pelvis dated 04/17/2021. FINDINGS: Lower chest: The visualized lung bases are clear. There is coronary vascular calcification. No intra-abdominal free air or free fluid. Hepatobiliary: Cirrhosis. Innumerable small hypodense nodules are not characterized,  possibly regenerative nodules. These can be better evaluated with MRI on a nonemergent/outpatient basis. No intrahepatic biliary dilatation. Layering sludge and small stones within the gallbladder. No pericholecystic fluid or evidence of acute cholecystitis by CT. Pancreas: Unremarkable. No pancreatic ductal dilatation or surrounding inflammatory changes. Spleen: Normal in size without focal abnormality. Adrenals/Urinary Tract: The adrenal glands are unremarkable there is no hydronephrosis on either side. There is symmetric enhancement and excretion of contrast by both kidneys. The visualized ureters and urinary bladder appear unremarkable. Stomach/Bowel: There is a small hiatal hernia. There is no bowel obstruction or active inflammation. The appendix is normal. Vascular/Lymphatic: Mild aortoiliac atherosclerotic disease. The IVC is unremarkable. There is recanalization of the umbilical vein as well as small anterior abdominal vascular collaterals. No portal venous gas. There is no adenopathy. Reproductive: The prostate and seminal vesicles are grossly unremarkable. No pelvic mass. Other: Small fat containing umbilical hernia. Musculoskeletal: Avascular necrosis of the right femoral head. No acute osseous pathology. IMPRESSION: 1. No acute intra-abdominal or pelvic pathology. 2. Cirrhosis with evidence of portal hypertension. 3. Cholelithiasis. 4. Aortic Atherosclerosis (ICD10-I70.0). Electronically Signed   By: Elgie CollardArash  Radparvar M.D.   On: 09/18/2021 02:28   DG Chest Portable 1 View  Result Date: 09/18/2021 CLINICAL DATA:  Nausea and vomiting. EXAM: PORTABLE CHEST 1 VIEW COMPARISON:  Chest x-ray 08/13/2020 FINDINGS: The heart size and mediastinal contours are within normal limits. Both lungs are clear. The visualized skeletal structures are unremarkable. IMPRESSION: No active disease. Electronically Signed   By: Darliss CheneyAmy  Guttmann M.D.   On: 09/18/2021 02:16    Procedures Procedures  Medications Ordered in  ED Medications  magnesium sulfate IVPB 2 g 50 mL (2 g Intravenous New Bag/Given 09/18/21 0236)  potassium chloride 10 mEq in 100 mL IVPB (10 mEq Intravenous New Bag/Given 09/18/21 0311)  sodium chloride 0.9 % bolus 1,000 mL (0 mLs Intravenous Stopped 09/18/21 0226)  ondansetron (ZOFRAN) injection 4 mg (4 mg Intravenous Given 09/18/21 0058)  iohexol (OMNIPAQUE) 300 MG/ML solution 100 mL (100 mLs Intravenous Contrast Given 09/18/21 0157)  ondansetron (ZOFRAN) injection 4 mg (4 mg Intravenous Given 09/18/21 0305)    ED Course/ Medical Decision Making/ A&P Clinical Course as of 09/18/21 0313  Fri Sep 18, 2021  0232 CT ABDOMEN PELVIS W CONTRAST [AP]  0233 DG Chest Portable 1 View [AP]  [phone] Reviewed and agree it is negative  [AP]  0233 Reviewd and agree negative for acute pathology.   [AP]    Clinical Course User Index [AP] Cy Blamer, MD                           Medical Decision Making Patient with fever 4 days ago but none since. Had a positive flu test on 1/19.  Now 1 day of vomiting.  Some constipation. Global fatigue.  Has not noticed a change in skin color but family has.  Reports last etoh was 200 days ago.  Concern was immediately for dehydration.   Problems Addressed: Dehydration: acute illness or injury with systemic symptoms    Details: secondary to emesis.  treatment: IV fluid bolus and IV fluids then at a continuous rate Elevated liver enzymes: chronic illness or injury    Details: A chronic component but overall worsened then previous labs in our system from August 2022 Hyperammonemia [facility]): acute illness or injury with systemic symptoms    Details: Secondary to liver disease.  Treatment: will start lactulose in the ED Hyperbilirubinemia: chronic illness or injury with exacerbation, progression, or side effects of treatment    Details: Was elevated on previous labs in August 2022, but worsening Hypokalemia: acute illness or injury    Details: Likely secondary to vomiting  and decreased PO intake.  Treatment: IV potassium Hypomagnesemia: acute illness or injury    Details: IV magnesium given Nausea and vomiting, unspecified vomiting type: acute illness or injury    Details: IV fluid bolus then continuous IV fluids and zofran Pancytopenia (HCC): acute illness or injury    Details: May be due to recent flu but given overall worsening of liver function, I suspect this is the underlying cause.  Hemoglobin likely will be markedly worse on recheck as I believe the patient is hemoconcentrated from dehydration  Amount and/or Complexity of Data Reviewed External Data Reviewed: labs, radiology and notes.    Details: Myopathy [place] and liver function tests as well as outside notes from [facility] reviewed. Labs: ordered.    Details: Elevated glucose, below diagnosis of diabetes but will need hemoglobin A1c as glucose tonight would be fasting as patient has been unable to tolerate orals.  Hypokalemia.  Hypomagnesemia, elevated bilirubin 5.4 tody up from 3, elevated LFTs, worsening since August.  Hyperammonemia, started lactulose. Radiology: ordered. Decision-making details documented in ED Course.    Details: CT abdomen and pelvis: no acute finding on my read.  I agree with radiologist findings.   No acute disease of the chest on chest Xray  Risk OTC drugs. Prescription drug management. Decision regarding hospitalization. Risk Details: Patient with need inpatient treatment for multiple metabolic issues: hyponatremia,  hypokalemia, hypomagnesemia, hyperammonemia.  Elevated glucose will also need additional work up.  Patient is also pancytopenic and has worsening liver pathology which will necessitate further work up and treatment.  Critical Care Total time providing critical care: 30-74 minutes (bolus, multiple electrolyte abnormalities) MDM Reviewed: previous chart, vitals and nursing note Reviewed previous: labs Interpretation: labs, x-ray and CT scan (hypokalemia,  hyponatremia, elevated glucose, pancytopenia, severe thrombocytopenia, elevated glucose) Total time providing critical care: 30-74 minutes (bolus, multiple electrolyte abnormalities). This excludes time spent performing separately reportable procedures and services. Consults: admitting MD  CRITICAL CARE Performed by: Shaya Altamura K Cylan Borum-Rasch Total critical care time: 30 minutes Critical care time was exclusive of separately billable procedures and treating other patients. Critical care was necessary to treat or prevent imminent or life-threatening deterioration. Critical care was time spent personally by me on the following activities: development of treatment plan with patient and/or surrogate as well as nursing, discussions with consultants, evaluation of patient's response to treatment, examination of patient, obtaining history from patient or surrogate, ordering and performing treatments and interventions, ordering and review of laboratory studies, ordering and review of radiographic studies, pulse oximetry and re-evaluation of patient's condition.  The patient appears reasonably stabilized for admission considering the current resources, flow, and capabilities available in the ED at this time, and I doubt any other [facility] requiring further screening and/or treatment in the ED prior to admission.  Final Clinical Impression(s) / ED Diagnoses Final diagnoses:  Pancytopenia (HCC)  Hyperbilirubinemia  Elevated liver enzymes  Hypokalemia  Hypomagnesemia  Dehydration  Portal hypertension (HCC)  Nausea and vomiting, unspecified vomiting type   Admit to medicine        Chiron Campione, MD 09/18/21 [id number]

## 2021-09-18 NOTE — Progress Notes (Signed)
Lab called with critical result; phosphorous <1.0. MD made aware.

## 2021-09-18 NOTE — ED Notes (Signed)
Pt c/o headache and heartburn.  Md notified

## 2021-09-19 LAB — CBC WITH DIFFERENTIAL/PLATELET
Abs Immature Granulocytes: 0.01 10*3/uL (ref 0.00–0.07)
Basophils Absolute: 0 10*3/uL (ref 0.0–0.1)
Basophils Relative: 1 %
Eosinophils Absolute: 0.1 10*3/uL (ref 0.0–0.5)
Eosinophils Relative: 3 %
HCT: 28.4 % — ABNORMAL LOW (ref 39.0–52.0)
Hemoglobin: 10.5 g/dL — ABNORMAL LOW (ref 13.0–17.0)
Immature Granulocytes: 0 %
Lymphocytes Relative: 28 %
Lymphs Abs: 1.1 10*3/uL (ref 0.7–4.0)
MCH: 35 pg — ABNORMAL HIGH (ref 26.0–34.0)
MCHC: 37 g/dL — ABNORMAL HIGH (ref 30.0–36.0)
MCV: 94.7 fL (ref 80.0–100.0)
Monocytes Absolute: 0.6 10*3/uL (ref 0.1–1.0)
Monocytes Relative: 16 %
Neutro Abs: 2.1 10*3/uL (ref 1.7–7.7)
Neutrophils Relative %: 52 %
Platelets: 24 10*3/uL — CL (ref 150–400)
RBC: 3 MIL/uL — ABNORMAL LOW (ref 4.22–5.81)
RDW: 15.6 % — ABNORMAL HIGH (ref 11.5–15.5)
WBC: 4 10*3/uL (ref 4.0–10.5)
nRBC: 0 % (ref 0.0–0.2)

## 2021-09-19 LAB — LIPASE, BLOOD: Lipase: 70 U/L — ABNORMAL HIGH (ref 11–51)

## 2021-09-19 LAB — AMMONIA: Ammonia: 32 umol/L (ref 9–35)

## 2021-09-19 LAB — HIV ANTIBODY (ROUTINE TESTING W REFLEX): HIV Screen 4th Generation wRfx: NONREACTIVE

## 2021-09-19 LAB — COMPREHENSIVE METABOLIC PANEL
ALT: 27 U/L (ref 0–44)
AST: 106 U/L — ABNORMAL HIGH (ref 15–41)
Albumin: 3.1 g/dL — ABNORMAL LOW (ref 3.5–5.0)
Alkaline Phosphatase: 73 U/L (ref 38–126)
Anion gap: 11 (ref 5–15)
BUN: <5 mg/dL — ABNORMAL LOW (ref 6–20)
CO2: 24 mmol/L (ref 22–32)
Calcium: 7.5 mg/dL — ABNORMAL LOW (ref 8.9–10.3)
Chloride: 100 mmol/L (ref 98–111)
Creatinine, Ser: 0.98 mg/dL (ref 0.61–1.24)
GFR, Estimated: >60 mL/min (ref >60)
Glucose, Bld: 101 mg/dL — ABNORMAL HIGH (ref 70–99)
Potassium: 3.2 mmol/L — ABNORMAL LOW (ref 3.5–5.1)
Sodium: 135 mmol/L (ref 135–145)
Total Bilirubin: 3.6 mg/dL — ABNORMAL HIGH (ref 0.3–1.2)
Total Protein: 6.3 g/dL — ABNORMAL LOW (ref 6.5–8.1)

## 2021-09-19 LAB — PHOSPHORUS: Phosphorus: <1 mg/dL — CL (ref 2.5–4.6)

## 2021-09-19 LAB — MAGNESIUM: Magnesium: 1.5 mg/dL — ABNORMAL LOW (ref 1.7–2.4)

## 2021-09-19 MED ORDER — POTASSIUM PHOSPHATES 15 MMOLE/5ML IV SOLN
30.0000 mmol | Freq: Four times a day (QID) | INTRAVENOUS | Status: AC
  Administered 2021-09-19 (×2): 30 mmol via INTRAVENOUS
  Filled 2021-09-19 (×3): qty 10

## 2021-09-19 MED ORDER — POTASSIUM CHLORIDE CRYS ER 20 MEQ PO TBCR
40.0000 meq | EXTENDED_RELEASE_TABLET | Freq: Once | ORAL | Status: AC
  Administered 2021-09-19: 40 meq via ORAL
  Filled 2021-09-19: qty 2

## 2021-09-19 MED ORDER — ALBUTEROL SULFATE (2.5 MG/3ML) 0.083% IN NEBU: 2.5000 mg | INHALATION_SOLUTION | RESPIRATORY_TRACT | Status: DC | PRN

## 2021-09-19 MED ORDER — SODIUM CHLORIDE 0.9 % IV SOLN: INTRAVENOUS | Status: DC

## 2021-09-19 MED ORDER — MAGNESIUM SULFATE 2 GM/50ML IV SOLN
2.0000 g | Freq: Once | INTRAVENOUS | Status: AC
Start: 2021-09-19 — End: 2021-09-20
  Administered 2021-09-19: 2 g via INTRAVENOUS
  Filled 2021-09-19: qty 50

## 2021-09-19 MED ORDER — SENNA 8.6 MG PO TABS: 1.0000 | ORAL_TABLET | Freq: Two times a day (BID) | ORAL | Status: DC | PRN

## 2021-09-19 MED ORDER — CALCIUM GLUCONATE-NACL 1-0.675 GM/50ML-% IV SOLN
1.0000 g | Freq: Once | INTRAVENOUS | Status: AC
  Administered 2021-09-19: 1000 mg via INTRAVENOUS
  Filled 2021-09-19: qty 50

## 2021-09-19 NOTE — Evaluation (Signed)
Physical Therapy Evaluation Patient Details Name: Juan [name] MRN: [id number] DOB: [birth year] Today's Date: 09/19/2021  History of Present Illness  The pt is a 56 yo male presenting 1/27 with nausea/vomiting x4 days, found to have liver cirrhosis with portal HTN, hypokalemia/hypomagnesemia, and weakness. PMH includes: chronic alcoholism, ulcerative colitis, DVT, traumatic hemorrhagic shock, anxiety, depression, PTSD, chronic fatigue. Pt also dx with  muscular dystrophy, states his neurologist at [facility] thinks it is related to alcoholism."   Clinical Impression  Pt in bed upon arrival of PT, agreeable to evaluation at this time. Prior to admission the pt was mobilizing with use of rollator in the home and completing OP neuro PT to work on balance dysfunction. He was able to complete short bout of ambulation in the room with use of RW and minG or no DME and minA to steady. He required further assist with addition of challenges such as stepping over objects or onto unstable surfaces and demos generally poor coordination and control in BLE. However, these noted mobility deficits are the pt's baseline and deficits already being addressed by outpatient neuro PT. The pt is hopeful to return home with good family support once medically stable. Will benefit from skilled PT acutely to maintain mobility and continue working on coordination and dynamic stability to reduce risk of further falls at home.   5X Sit-to-Stand: 18.3 sec (> 11.4 sec indicates increased risk of falls for individuals aged < 69, > 15 sec indicates increased risk of recurrent falls)         Recommendations for follow up therapy are one component of a multi-disciplinary discharge planning process, led by the attending physician.  Recommendations may be updated based on patient status, additional functional criteria and insurance authorization.  Follow Up Recommendations Outpatient PT (resume OP neuro PT)    Assistance  Recommended at Discharge Intermittent Supervision/Assistance  Patient can return home with the following  A little help with walking and/or transfers;A little help with bathing/dressing/bathroom;Assistance with cooking/housework;Assist for transportation;Help with stairs or ramp for entrance    Equipment Recommendations None recommended by PT  Recommendations for Other Services       Functional Status Assessment Patient has not had a recent decline in their functional status     Precautions / Restrictions Precautions Precautions: Fall Precaution Comments: pt with 4 falls in last 6 months Restrictions Weight Bearing Restrictions: No      Mobility  Bed Mobility Overal bed mobility: Independent                  Transfers Overall transfer level: Independent Equipment used: None               General transfer comment: mild instability, but no overt LOB. 5x sit-stand in 18 sec    Ambulation/Gait Ambulation/Gait assistance: Min guard, Min assist Gait Distance (Feet): 15 Feet (+ 35 ft) Assistive device: Rolling walker (2 wheels), None Gait Pattern/deviations: Step-through pattern, Decreased stride length, Shuffle, Decreased weight shift to left, Decreased stance time - left Gait velocity: decreased Gait velocity interpretation: <1.31 ft/sec, indicative of household ambulator   General Gait Details: pt with small shuffling steps, poor clearance bilaterally     Balance Overall balance assessment: Needs assistance, History of Falls Sitting-balance support: No upper extremity supported Sitting balance-Leahy Scale: Good     Standing balance support: Single extremity supported Standing balance-Leahy Scale: Poor Standing balance comment: pt able to static stand without assist, ambulate with single UE support. minA needed with any balance challenge  High Level Balance Comments: stepping over objects, onto/off of foam. LOB x2 needing minA              Pertinent Vitals/Pain Pain Assessment Pain Assessment: No/denies pain    Home Living Family/patient expects to be discharged to:: Private residence Living Arrangements: Spouse/significant other Available Help at Discharge: Family;Available 24 hours/day Type of Home: House Home Access: Stairs to enter Entrance Stairs-Rails: Left Entrance Stairs-Number of Steps: 4   Home Layout: One level Home Equipment: Rollator (4 wheels);Cane - quad;BSC/3in1;Grab bars - tub/shower      Prior Function Prior Level of Function : Independent/Modified Independent;History of Falls (last six months)             Mobility Comments: 4 falls in last 6 months, reports use of RW or furniture walking ADLs Comments: pt reports independence     Hand Dominance   Dominant Hand: Right    Extremity/Trunk Assessment   Upper Extremity Assessment Upper Extremity Assessment: Overall WFL for tasks assessed (less tremmors than in LE)    Lower Extremity Assessment Lower Extremity Assessment: RLE deficits/detail;LLE deficits/detail RLE Deficits / Details: grossly 4/5, tremors wtih force production, reports sensation fully intact RLE Sensation: WNL RLE Coordination: decreased fine motor;decreased gross motor LLE Deficits / Details: grossly 4-/5, pt reports this knee has buckled in past. prior ankle surgery x2 resulting in impaired ankle ROM and strenght. sensation intact LLE Sensation: WNL LLE Coordination: decreased fine motor;decreased gross motor    Cervical / Trunk Assessment Cervical / Trunk Assessment: Normal  Communication   Communication: No difficulties  Cognition Arousal/Alertness: Awake/alert Behavior During Therapy: WFL for tasks assessed/performed Overall Cognitive Status: Within Functional Limits for tasks assessed                                          General Comments General comments (skin integrity, edema, etc.): VSS on RA        Assessment/Plan    PT  Assessment Patient needs continued PT services  PT Problem List Decreased strength;Decreased activity tolerance;Decreased balance;Decreased mobility;Decreased coordination       PT Treatment Interventions DME instruction;Gait training;Stair training;Functional mobility training;Therapeutic activities;Therapeutic exercise;Balance training;Patient/family education    PT Goals (Current goals can be found in the Care Plan section)  Acute Rehab PT Goals Patient Stated Goal: return to outpatient neuro PT PT Goal Formulation: With patient Time For Goal Achievement: 10/03/21 Potential to Achieve Goals: Good    Frequency Min 3X/week        AM-PAC PT "6 Clicks" Mobility  Outcome Measure Help needed turning from your back to your side while in a flat bed without using bedrails?: None Help needed moving from lying on your back to sitting on the side of a flat bed without using bedrails?: None Help needed moving to and from a bed to a chair (including a wheelchair)?: None Help needed standing up from a chair using your arms (e.g., wheelchair or bedside chair)?: None Help needed to walk in hospital room?: A Little Help needed climbing 3-5 steps with a railing? : A Little 6 Click Score: 22    End of Session Equipment Utilized During Treatment: Gait belt Activity Tolerance: Patient tolerated treatment well;Patient limited by fatigue Patient left: in chair;with call bell/phone within reach Nurse Communication: Mobility status PT Visit Diagnosis: Unsteadiness on feet (R26.81);Other abnormalities of gait and mobility (R26.89);Muscle weakness (generalized) (M62.81);History of falling (Z91.81)  Time: [id number] PT Time Calculation (min) (ACUTE ONLY): 29 min   Charges:   PT Evaluation $PT Eval Moderate Complexity: 1 Mod PT Treatments $Neuromuscular Re-education: 8-22 mins        West Carbo, PT, DPT   Acute Rehabilitation Department Pager #: [phone]  Sandra Cockayne 09/19/2021,  1:33 PM

## 2021-09-19 NOTE — Progress Notes (Signed)
PROGRESS NOTE  Juan [name]  DOB: Feb 26, 1966  PCP: Juan Nutting, DO [id number]:[id number]  DOA: 09/18/2021  LOS: 1 day  Hospital Day: 2  Chief Complaint  Patient presents with   Vomiting    Brief negative: Juan [name] is a 56 y.o. male with PMH significant for chronic alcoholism, liver cirrhosis, muscular dystrophy HTN, ulcerative colitis, DVT, traumatic hemorrhagic shock, anxiety, depression, PTSD, chronic fatigue. Patient presented to the ED last night with complaint of worsening fatigue. On 1/19, patient tested positive for flu B at PCPs office and was started on a course of Tamiflu. His fever subsided but continued to have weakness, anorexia.   He lives at home with his wife.  Last year, he was diagnosed with muscular dystrophy, states his neurologist at [facility] thinks it is related to alcoholism.  He quit drinking alcohol few months ago.  At home, uses a walker and a wheelchair. He and his family were aware he had fatty liver but not aware of liver cirrhosis.  On chart review it seems that CT scan of abdomen from August 2022 had mention liver cirrhosis.  Patient has a high screening colonoscopy in near future, has not had any EGD.  No history of GI blood loss.  In the ED, patient was afebrile, heart rate 111, blood pressure 97/64, breathing on room air Labs abnormal with sodium low at 130, potassium low at 2.6, serum bicarb low at 12, glucose elevated to 198, creatinine at 1.21, magnesium low at 1, lipase slightly up at 55, AST elevated to 107, total bilirubin elevated to 5.4, ammonia elevated to 114 WBC count low at 3.2, hemoglobin at 12.4 with MCV 96, platelets low at 21 Urinalysis with clear yellow urine, more than 80 ketones CT abdomen pelvis showed liver cirrhosis with portal hypertension, cholelithiasis.  Subjective: -Seen and examined this morning.  Pleasant middle-aged Caucasian male.  Getting up to go to the potty chair.  Had multiple episodes of diarrhea last  night.  Assessment/Plan: Liver cirrhosis with portal hypertension Hyperammonemia -History of chronic alcoholism -Presented with progressive fatigue, increased ammonia level, increased total bilirubin, slightly elevated liver enzymes.  No mention of ascites. -INR 1.5.   -Ammonia level is elevated 214.  Improved to normal with lactulose.  Patient is having multiple episodes of diarrhea with lactulose.  I will stop it at this time. Recent Labs  Lab 09/18/21 0057 09/19/21 0306  AST 107* 106*  ALT 28 27  ALKPHOS 86 73  BILITOT 5.4* 3.6*  PROT 7.9 6.3*  ALBUMIN 3.9 3.1*   Recent Labs  Lab 09/18/21 1722  INR 1.5*    Recent Labs  Lab 09/18/21 0152 09/19/21 0306  AMMONIA 114* 32    Hypokalemia/hypomagnesemia/hypophosphatemia -Severely low potassium, magnesium and phosphorus level.  Replacement given yesterday. -Labs from this morning continues to show low levels of all 3 again today.  IV and oral replacement ordered again. Recent Labs  Lab 09/18/21 0057 09/18/21 0205 09/18/21 1722 09/19/21 0306  K 2.6* 2.9* 2.8* 3.2*  MG 1.0*  --   --  1.5*  PHOS  --   --  <1.0* <1.0*   Acute anion gap metabolic acidosis -Admission labs showed low serum bicarb level at 12 and elevated anion gap at 28 with normal lactic acid level.   -Serum bicarb drip, bicarbonate level improved to normal.  Change fluid to normal saline this morning. Recent Labs    10/30/20 1337 01/06/21 0000 04/06/21 1822 04/08/21 0000 04/17/21 0000 09/18/21 0057 09/18/21 1722 09/19/21 5427  BUN 6* 8 9 19 12 10  5* <5*  CREATININE 0.76 0.82 1.00 1.04 0.88 1.21 1.01 0.98  CO2 29 30 23 25 24  12* 18* 24   Hyponatremia -No evidence of hypervolemia at this time.  Probably related to low oral intake. -Improving with hydration.  Continue to monitor Recent Labs  Lab 09/18/21 0057 09/18/21 1722 09/19/21 0306  NA 130* 133* 135   Thrombocytopenia -Likely due to liver disease -Avoid heparin products for DVT  prophylaxis at this time Recent Labs  Lab 09/18/21 0057 09/18/21 1722 09/19/21 0306  PLT 21* PLATELET CLUMPS NOTED ON SMEAR, UNABLE TO ESTIMATE 24*   Hyperglycemia -A1c 5.1.  No history of diabetes mellitus -Blood sugar level elevated to 198 on arrival.  Continue to monitor.  Essential hypertension -Blood pressure in low normal range.  Keep home meds on hold  Depression -Continue home meds  Recent flu B infection -Completed 5-day course of Tamiflu.  Currently does not have any respiratory symptoms. -Continue supportive care  Impaired mobility Muscular dystrophy -At baseline, patient uses walker and wheelchair because of muscular dystrophy -PT eval ordered.  He is expecting to go to [facility]  Mobility: PT eval pending Living condition: Lives at home with wife Goals of care:   Code Status: Full Code  Nutritional status: Body mass index is 27.33 kg/m.      Diet:  Diet Order             Diet regular Room service appropriate? Yes with Assist; Fluid consistency: Thin  Diet effective now                  DVT prophylaxis:  SCDs Start: 09/18/21 1720   Antimicrobials: None Fluid: NS at 89 Consultants: None Family Communication: None at bedside today  Status is: Inpatient  Continue in-hospital care because: Continues to have low electrolyte levels Level of care: Progressive   Dispo: The patient is from: Home              Anticipated d/c is to: Hopefully back to home in 1 to 2 days              Patient currently is not medically stable to d/c.   Difficult to place patient No     Infusions:   sodium chloride 75 mL/hr at 09/19/21 0927   potassium PHOSPHATE IVPB (in mmol)      Scheduled Meds:  folic acid  1 mg Oral Daily   magnesium oxide  400 mg Oral Daily   pantoprazole  40 mg Oral Daily   senna  1 tablet Oral BID   thiamine  100 mg Oral Daily    PRN meds: acetaminophen **OR** acetaminophen, albuterol, hydrALAZINE, ondansetron **OR** ondansetron  (ZOFRAN) IV   Antimicrobials: Anti-infectives (From admission, onward)    None       Objective: Vitals:   09/19/21 0348 09/19/21 0737  BP: 106/70 120/79  Pulse: 77 81  Resp: 16 18  Temp: 98.4 F (36.9 C) 98.3 F (36.8 C)  SpO2: 97% 97%    Intake/Output Summary (Last 24 hours) at 09/19/2021 1055 Last data filed at 09/19/2021 0429 Gross per 24 hour  Intake 3372.77 ml  Output 325 ml  Net 3047.77 ml   Filed Weights   09/18/21 0021 09/18/21 1639  Weight: 73.9 kg 74.5 kg   Weight change: 0.564 kg Body mass index is 27.33 kg/m.   Physical Exam: General exam: Pleasant, middle-aged Caucasian male. Skin: No rashes, lesions or ulcers. HEENT:  Atraumatic, normocephalic, no obvious bleeding Lungs: Clear to auscultation bilaterally CVS: Regular rate and rhythm, no murmur GI/Abd soft, nontender, nondistended, bowel sound present CNS: Alert, awake, oriented x3 Psychiatry: Mood appropriate Extremities: No pedal edema, no calf tenderness  Data Review: I have personally reviewed the laboratory data and studies available.  F/u labs ordered Unresulted Labs (From admission, onward)     Start     Ordered   09/20/21 0500  Phosphorus  Tomorrow morning,   R        09/19/21 1052   09/20/21 0500  Magnesium  Tomorrow morning,   STAT        09/19/21 1052   09/19/21 0500  Comprehensive metabolic panel  Daily,   R      09/18/21 1654   09/19/21 0500  CBC with Differential/Platelet  Daily,   R      09/18/21 1654   09/19/21 0500  HIV Antibody (routine testing w rflx)  (HIV Antibody (Routine testing w reflex) panel)  Tomorrow morning,   R        09/18/21 1721            Signed, Terrilee Croak, MD [facility] 09/19/2021

## 2021-09-19 NOTE — Progress Notes (Signed)
Critical lab reported phosphorous <1.0.  MD made aware

## 2021-09-20 LAB — URINALYSIS, ROUTINE W REFLEX MICROSCOPIC
Bacteria, UA: NONE SEEN
Bilirubin Urine: NEGATIVE
Glucose, UA: NEGATIVE mg/dL
Ketones, ur: NEGATIVE mg/dL
Leukocytes,Ua: NEGATIVE
Nitrite: NEGATIVE
Protein, ur: NEGATIVE mg/dL
RBC / HPF: >50 RBC/hpf — ABNORMAL HIGH (ref 0–5)
Specific Gravity, Urine: 1.006 (ref 1.005–1.030)
pH: 8 (ref 5.0–8.0)

## 2021-09-20 LAB — CBC WITH DIFFERENTIAL/PLATELET
Abs Immature Granulocytes: 0.01 10*3/uL (ref 0.00–0.07)
Basophils Absolute: 0 10*3/uL (ref 0.0–0.1)
Basophils Relative: 1 %
Eosinophils Absolute: 0.1 10*3/uL (ref 0.0–0.5)
Eosinophils Relative: 2 %
HCT: 29.7 % — ABNORMAL LOW (ref 39.0–52.0)
Hemoglobin: 10.6 g/dL — ABNORMAL LOW (ref 13.0–17.0)
Immature Granulocytes: 0 %
Lymphocytes Relative: 25 %
Lymphs Abs: 1.1 10*3/uL (ref 0.7–4.0)
MCH: 34.6 pg — ABNORMAL HIGH (ref 26.0–34.0)
MCHC: 35.7 g/dL (ref 30.0–36.0)
MCV: 97.1 fL (ref 80.0–100.0)
Monocytes Absolute: 0.6 10*3/uL (ref 0.1–1.0)
Monocytes Relative: 14 %
Neutro Abs: 2.5 10*3/uL (ref 1.7–7.7)
Neutrophils Relative %: 58 %
Platelets: 28 10*3/uL — CL (ref 150–400)
RBC: 3.06 MIL/uL — ABNORMAL LOW (ref 4.22–5.81)
RDW: 16.1 % — ABNORMAL HIGH (ref 11.5–15.5)
WBC: 4.3 10*3/uL (ref 4.0–10.5)
nRBC: 0 % (ref 0.0–0.2)

## 2021-09-20 LAB — COMPREHENSIVE METABOLIC PANEL
ALT: 29 U/L (ref 0–44)
AST: 101 U/L — ABNORMAL HIGH (ref 15–41)
Albumin: 3 g/dL — ABNORMAL LOW (ref 3.5–5.0)
Alkaline Phosphatase: 74 U/L (ref 38–126)
Anion gap: 7 (ref 5–15)
BUN: <5 mg/dL — ABNORMAL LOW (ref 6–20)
CO2: 25 mmol/L (ref 22–32)
Calcium: 7.6 mg/dL — ABNORMAL LOW (ref 8.9–10.3)
Chloride: 104 mmol/L (ref 98–111)
Creatinine, Ser: 0.77 mg/dL (ref 0.61–1.24)
GFR, Estimated: >60 mL/min (ref >60)
Glucose, Bld: 108 mg/dL — ABNORMAL HIGH (ref 70–99)
Potassium: 3.2 mmol/L — ABNORMAL LOW (ref 3.5–5.1)
Sodium: 136 mmol/L (ref 135–145)
Total Bilirubin: 3.4 mg/dL — ABNORMAL HIGH (ref 0.3–1.2)
Total Protein: 6.2 g/dL — ABNORMAL LOW (ref 6.5–8.1)

## 2021-09-20 LAB — MAGNESIUM: Magnesium: 1.4 mg/dL — ABNORMAL LOW (ref 1.7–2.4)

## 2021-09-20 LAB — PHOSPHORUS: Phosphorus: 2.3 mg/dL — ABNORMAL LOW (ref 2.5–4.6)

## 2021-09-20 MED ORDER — MAGNESIUM SULFATE 4 GM/100ML IV SOLN
4.0000 g | Freq: Once | INTRAVENOUS | Status: AC
  Administered 2021-09-20: 4 g via INTRAVENOUS
  Filled 2021-09-20: qty 100

## 2021-09-20 MED ORDER — SODIUM PHOSPHATES 45 MMOLE/15ML IV SOLN
30.0000 mmol | Freq: Once | INTRAVENOUS | Status: AC
  Administered 2021-09-20: 30 mmol via INTRAVENOUS
  Filled 2021-09-20: qty 10

## 2021-09-20 MED ORDER — POTASSIUM CHLORIDE CRYS ER 20 MEQ PO TBCR
40.0000 meq | EXTENDED_RELEASE_TABLET | ORAL | Status: AC
  Administered 2021-09-20 (×2): 40 meq via ORAL
  Filled 2021-09-20 (×2): qty 2

## 2021-09-20 MED ORDER — CALCIUM GLUCONATE-NACL 2-0.675 GM/100ML-% IV SOLN
2.0000 g | Freq: Once | INTRAVENOUS | Status: AC
  Administered 2021-09-20: 2000 mg via INTRAVENOUS
  Filled 2021-09-20: qty 100

## 2021-09-20 NOTE — Progress Notes (Signed)
PROGRESS NOTE  Juan [name]  DOB: 1966-03-31  PCP: [name], DO [id number]  DOA: 09/18/2021  LOS: 2 days  Hospital Day: 3  Chief Complaint  Patient presents with   Vomiting    Brief negative: Juan [name] is a 56 y.o. male with PMH significant for chronic alcoholism, liver cirrhosis, muscular dystrophy HTN, ulcerative colitis, DVT, traumatic hemorrhagic shock, anxiety, depression, PTSD, chronic fatigue. Patient presented to the ED last night with complaint of worsening fatigue. On 1/19, patient tested positive for flu B at PCPs office and was started on a course of Tamiflu. His fever subsided but continued to have weakness, anorexia.   He lives at home with his wife.  Last year, he was diagnosed with muscular dystrophy, states his neurologist at [facility] thinks it is related to alcoholism.  He quit drinking alcohol few months ago.  At home, uses a walker and a wheelchair. He and his family were aware he had fatty liver but not aware of liver cirrhosis.  On chart review it seems that CT scan of abdomen from August 2022 had mention liver cirrhosis.  Patient has a high screening colonoscopy in near future, has not had any EGD.  No history of GI blood loss.  In the ED, patient was afebrile, heart rate 111, blood pressure 97/64, breathing on room air Labs abnormal with sodium low at 130, potassium low at 2.6, serum bicarb low at 12, glucose elevated to 198, creatinine at 1.21, magnesium low at 1, lipase slightly up at 55, AST elevated to 107, total bilirubin elevated to 5.4, ammonia elevated to 114 WBC count low at 3.2, hemoglobin at 12.4 with MCV 96, platelets low at 21 Urinalysis with clear yellow urine, more than 80 ketones CT abdomen pelvis showed liver cirrhosis with portal hypertension, cholelithiasis.  Subjective: -Seen and examined this morning.  -Lying down in bed. Not in distress.  Diarrhea improved. -Electrolytes still low this morning. -Patient is concerned  about seeing some blood from his urethral meatus today.  Minimal amount per nurse.  Assessment/Plan: Liver cirrhosis with portal hypertension Hyperammonemia -History of chronic alcoholism -Presented with progressive fatigue, increased ammonia level, increased total bilirubin, slightly elevated liver enzymes.  No mention of ascites. -INR 1.5.  Ammonia level was elevated to 114.  Improved to normal with lactulose.  Lactulose was stopped after patient had frequent diarrhea.  Ammonia level improved to normal.  Hypokalemia/hypomagnesemia/hypophosphatemia -Severely low potassium, magnesium and phosphorus level.   -Levels continue to remain low despite replacement.  Continue IV and oral replacement today as well. -Monitor labs tomorrow. Recent Labs  Lab 09/18/21 0057 09/18/21 0205 09/18/21 1722 09/19/21 0306 09/20/21 0347  K 2.6* 2.9* 2.8* 3.2* 3.2*  MG 1.0*  --   --  1.5* 1.4*  PHOS  --   --  <1.0* <1.0* 2.3*   Hypocalcemia -Calcium level continues to remain low.  Give calcium gluconate today.  Continue to monitor Recent Labs  Lab 09/18/21 0057 09/18/21 1722 09/19/21 0306 09/20/21 0347  CALCIUM 8.4* 7.2* 7.5* 7.6*    Acute anion gap metabolic acidosis -Admission labs showed low serum bicarb level at 12 and elevated anion gap at 28 with normal lactic acid level.   -Serum bicarb drip, bicarbonate level improved to normal.  Currently on normal saline.  Seems adequately hydrated.  I good stop fluid today.  Hyponatremia -No evidence of hypervolemia at this time.  Probably related to low oral intake. -Improving with hydration.  Continue to monitor Recent Labs  Lab 09/18/21  [id number] 09/18/21 1722 09/19/21 0306 09/20/21 0347  NA 130* 133* 135 136   Mild hematuria Thrombocytopenia -Patient is having mild hematuria likely because of thrombocytopenia.  No history of of kidney stones or genitourinary tumor.  Platelet low in 20s but gradually improving. -Continue to monitor hematuria.   If worsens, may need to get CT abdomen and pelvis to rule out other etiology. -No indication of platelet transfusion at this time unless hematuria worsens Recent Labs  Lab 09/18/21 0057 09/18/21 1722 09/19/21 0306 09/20/21 0347  PLT 21* PLATELET CLUMPS NOTED ON SMEAR, UNABLE TO ESTIMATE 24* 28*   Hyperglycemia -A1c 5.1.  No history of diabetes mellitus -Blood sugar level elevated to 198 on arrival.  Continue to monitor.  Essential hypertension -Blood pressure in low normal range.  Keep home meds on hold  Depression -Continue home meds  Recent flu B infection -Completed 5-day course of Tamiflu.  Currently does not have any respiratory symptoms. -Continue supportive care  Impaired mobility Muscular dystrophy -At baseline, patient uses walker and wheelchair because of muscular dystrophy -PT eval ordered.  He is expecting to go to [facility]  Mobility: PT eval obtained.  Outpatient PT recommended. Living condition: Lives at home with wife Goals of care:   Code Status: Full Code  Nutritional status: Body mass index is 27.33 kg/m.      Diet:  Diet Order             Diet regular Room service appropriate? Yes with Assist; Fluid consistency: Thin  Diet effective now                  DVT prophylaxis:  SCDs Start: 09/18/21 1720   Antimicrobials: None Fluid: Stop fluid today Consultants: None Family Communication: None at bedside today  Status is: Inpatient  Continue in-hospital care because: Continues to have low electrolyte levels Level of care: Progressive   Dispo: The patient is from: Home              Anticipated d/c is to: Hopefully back to home in 1 to 2 days              Patient currently is not medically stable to d/c.   Difficult to place patient No     Infusions:   calcium gluconate     magnesium sulfate bolus IVPB 4 g (09/20/21 1020)   sodium phosphate  Dextrose 5% IVPB 30 mmol (09/20/21 1030)    Scheduled Meds:  folic acid  1 mg Oral Daily    magnesium oxide  400 mg Oral Daily   pantoprazole  40 mg Oral Daily   potassium chloride  40 mEq Oral Q2H   thiamine  100 mg Oral Daily    PRN meds: acetaminophen **OR** acetaminophen, albuterol, hydrALAZINE, ondansetron **OR** ondansetron (ZOFRAN) IV, senna   Antimicrobials: Anti-infectives (From admission, onward)    None       Objective: Vitals:   09/20/21 0400 09/20/21 0819  BP: 103/65 114/78  Pulse: 80 85  Resp: 17 17  Temp: 98.5 F (36.9 C) 98.2 F (36.8 C)  SpO2: 95% 95%    Intake/Output Summary (Last 24 hours) at 09/20/2021 1134 Last data filed at 09/20/2021 0659 Gross per 24 hour  Intake 2909.48 ml  Output 1325 ml  Net 1584.48 ml   Filed Weights   09/18/21 0021 09/18/21 1639  Weight: 73.9 kg 74.5 kg   Weight change:  Body mass index is 27.33 kg/m.   Physical Exam: General exam: Pleasant, middle-aged  Caucasian male.  Not in physical distress Skin: No rashes, lesions or ulcers. HEENT: Atraumatic, normocephalic, no obvious bleeding Lungs: Clear to auscultation bilaterally CVS: Regular rate and rhythm, no murmur GI/Abd soft, nontender, nondistended, bowel sound present CNS: Alert, awake, oriented x3 Psychiatry: Mood appropriate Extremities: No pedal edema, no calf tenderness  Data Review: I have personally reviewed the laboratory data and studies available.  F/u labs ordered Unresulted Labs (From admission, onward)     Start     Ordered   09/21/21 0500  Magnesium  Tomorrow morning,   STAT        09/20/21 0817   09/21/21 0500  Phosphorus  Tomorrow morning,   R        09/20/21 0817   09/20/21 0329  CBC with Differential/Platelet  ONCE - STAT,   STAT        09/20/21 0328   09/19/21 0500  Comprehensive metabolic panel  Daily,   R      09/18/21 1654   09/19/21 0500  CBC with Differential/Platelet  Daily,   R      09/18/21 1654            Signed, Terrilee Croak, MD [facility] 09/20/2021

## 2021-09-20 NOTE — Progress Notes (Signed)
Noticed some blood spots on the patient's blanket and sheets this AM. Looked over patient and did not see any noticeable open wounds or bleeding.  Then the nurse saw some blood spots on the patient's shorts and found a small leakage of blood coming from the inside of his penis. The patient has not had a foley catheter during this admission and has not injured it at all. He states that it doesn't hurt or burn.  Next, the nurse informed the on call physician who said to get phlebotomy to grab his CBC with Platelet labs.  Phlebotomy has been notified.  Will continue to monitor patient.  Harriet Masson, RN

## 2021-09-21 ENCOUNTER — Ambulatory Visit: Payer: 59 | Admitting: Family Medicine

## 2021-09-21 ENCOUNTER — Encounter (HOSPITAL_COMMUNITY): Payer: Self-pay | Admitting: Family Medicine

## 2021-09-21 DIAGNOSIS — K3189 Other diseases of stomach and duodenum: Secondary | ICD-10-CM

## 2021-09-21 DIAGNOSIS — D61818 Other pancytopenia: Principal | ICD-10-CM

## 2021-09-21 DIAGNOSIS — K766 Portal hypertension: Secondary | ICD-10-CM

## 2021-09-21 DIAGNOSIS — R112 Nausea with vomiting, unspecified: Secondary | ICD-10-CM

## 2021-09-21 DIAGNOSIS — R748 Abnormal levels of other serum enzymes: Secondary | ICD-10-CM

## 2021-09-21 DIAGNOSIS — K921 Melena: Secondary | ICD-10-CM

## 2021-09-21 LAB — COMPREHENSIVE METABOLIC PANEL
ALT: 28 U/L (ref 0–44)
AST: 94 U/L — ABNORMAL HIGH (ref 15–41)
Albumin: 2.9 g/dL — ABNORMAL LOW (ref 3.5–5.0)
Alkaline Phosphatase: 73 U/L (ref 38–126)
Anion gap: 9 (ref 5–15)
BUN: <5 mg/dL — ABNORMAL LOW (ref 6–20)
CO2: 26 mmol/L (ref 22–32)
Calcium: 8.5 mg/dL — ABNORMAL LOW (ref 8.9–10.3)
Chloride: 102 mmol/L (ref 98–111)
Creatinine, Ser: 0.81 mg/dL (ref 0.61–1.24)
GFR, Estimated: >60 mL/min (ref >60)
Glucose, Bld: 115 mg/dL — ABNORMAL HIGH (ref 70–99)
Potassium: 3.5 mmol/L (ref 3.5–5.1)
Sodium: 137 mmol/L (ref 135–145)
Total Bilirubin: 3.1 mg/dL — ABNORMAL HIGH (ref 0.3–1.2)
Total Protein: 6.2 g/dL — ABNORMAL LOW (ref 6.5–8.1)

## 2021-09-21 LAB — CBC WITH DIFFERENTIAL/PLATELET
Abs Immature Granulocytes: 0.01 10*3/uL (ref 0.00–0.07)
Basophils Absolute: 0 10*3/uL (ref 0.0–0.1)
Basophils Relative: 1 %
Eosinophils Absolute: 0.1 10*3/uL (ref 0.0–0.5)
Eosinophils Relative: 2 %
HCT: 29.1 % — ABNORMAL LOW (ref 39.0–52.0)
Hemoglobin: 10.7 g/dL — ABNORMAL LOW (ref 13.0–17.0)
Immature Granulocytes: 0 %
Lymphocytes Relative: 24 %
Lymphs Abs: 1.1 10*3/uL (ref 0.7–4.0)
MCH: 36.1 pg — ABNORMAL HIGH (ref 26.0–34.0)
MCHC: 36.8 g/dL — ABNORMAL HIGH (ref 30.0–36.0)
MCV: 98.3 fL (ref 80.0–100.0)
Monocytes Absolute: 0.8 10*3/uL (ref 0.1–1.0)
Monocytes Relative: 19 %
Neutro Abs: 2.5 10*3/uL (ref 1.7–7.7)
Neutrophils Relative %: 54 %
Platelets: 33 10*3/uL — ABNORMAL LOW (ref 150–400)
RBC: 2.96 MIL/uL — ABNORMAL LOW (ref 4.22–5.81)
RDW: 17.1 % — ABNORMAL HIGH (ref 11.5–15.5)
WBC: 4.5 10*3/uL (ref 4.0–10.5)
nRBC: 0 % (ref 0.0–0.2)

## 2021-09-21 LAB — MAGNESIUM: Magnesium: 1.6 mg/dL — ABNORMAL LOW (ref 1.7–2.4)

## 2021-09-21 LAB — PHOSPHORUS: Phosphorus: 2.5 mg/dL (ref 2.5–4.6)

## 2021-09-21 MED ORDER — BUSPIRONE HCL 10 MG PO TABS: 10.0000 mg | ORAL_TABLET | Freq: Three times a day (TID) | ORAL | Status: DC

## 2021-09-21 MED ORDER — MAGNESIUM SULFATE 2 GM/50ML IV SOLN
2.0000 g | Freq: Once | INTRAVENOUS | Status: AC
  Administered 2021-09-21: 2 g via INTRAVENOUS
  Filled 2021-09-21: qty 50

## 2021-09-21 MED ORDER — SODIUM CHLORIDE 0.9 % IV SOLN
1.0000 g | INTRAVENOUS | Status: DC
  Administered 2021-09-21 – 2021-09-23 (×3): 1 g via INTRAVENOUS
  Filled 2021-09-21 (×4): qty 10

## 2021-09-21 MED ORDER — VITAMIN K1 10 MG/ML IJ SOLN
10.0000 mg | Freq: Every day | INTRAVENOUS | Status: AC
  Administered 2021-09-21 – 2021-09-23 (×3): 10 mg via INTRAVENOUS
  Filled 2021-09-21 (×6): qty 1

## 2021-09-21 MED ORDER — BUSPIRONE HCL 5 MG PO TABS
15.0000 mg | ORAL_TABLET | Freq: Two times a day (BID) | ORAL | Status: DC
  Administered 2021-09-21 – 2021-09-23 (×6): 15 mg via ORAL
  Filled 2021-09-21 (×7): qty 1

## 2021-09-21 MED ORDER — INFLUENZA VAC SPLIT QUAD 0.5 ML IM SUSY
0.5000 mL | PREFILLED_SYRINGE | INTRAMUSCULAR | Status: DC
  Filled 2021-09-21: qty 0.5

## 2021-09-21 MED ORDER — VORTIOXETINE HBR 5 MG PO TABS
10.0000 mg | ORAL_TABLET | Freq: Every day | ORAL | Status: DC
  Administered 2021-09-21 – 2021-09-24 (×4): 10 mg via ORAL
  Filled 2021-09-21 (×4): qty 2

## 2021-09-21 MED ORDER — PANTOPRAZOLE SODIUM 40 MG PO TBEC
40.0000 mg | DELAYED_RELEASE_TABLET | Freq: Two times a day (BID) | ORAL | Status: DC
  Administered 2021-09-21 – 2021-09-24 (×6): 40 mg via ORAL
  Filled 2021-09-21 (×6): qty 1

## 2021-09-21 MED ORDER — SODIUM CHLORIDE 0.9 % IV SOLN: 1.0000 g | INTRAVENOUS | Status: DC

## 2021-09-21 NOTE — Plan of Care (Signed)

## 2021-09-21 NOTE — Consult Note (Signed)
[facility] Consult: 11:30 AM 09/21/2021  LOS: 3 days    Referring Provider: Dr Angie Fava.    Primary Care Physician:  [name], DO in [place].  Dr Elesa Massed at the [facility]. Primary Gastroenterologist: Dr. Gertie Fey at [facility] in [place].    Reason for Consultation: Rectal bleeding.   HPI: Juan [name] is a 56 y.o. male.  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  Ambulates using walker and wheelchair at home.  Fatty liver.Marland Kitchen  ETOH use disorder.  Major depression.  PTSD.  GERD.  TA colon polyp.  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  ?  Hemochromatosis, listed as PMH in 08/2019 note, ferritin normal at 46.9 in 07/2019.  Patient recalls phlebotomy therapy a few years ago.  Thrombocytopenia with platelets 45K in 03/2021.  No AFP measures in records. Meld score-sodium 8 in January 2021 Normal ceruloplasmin 08/2019.  Hepatitis A IgG, IgM, Hep B core IgG and IgM nonreactive 08/2019.  Hep B surface antibody reactive, hep B surface antigen nonreactive 08/2019.  Alpha 1 antitrypsin 11/2019 " single M isoform Detective, in context of normal alpha 1 antitrypsin, this is consistent with MM phenotype".   A few notes (not GI) mention ulcerative colitis but no mention of this in retrievable previous notes in care everywhere and pt denies this.  Current meds do not reflect treatment for IBD.  09/2019 colonoscopy.  Unable to locate results.  Patient said he had what sounds like an adenomatous polyp.  Has not had follow-up colonoscopy.  Does not recall mention of diverticulosis or of UC.  . 09/2019 EGD.  Unable to locate results.  Pt reports diagnosis of GERD 09/2019 FibroScan.  Unable to locate results.  Patient says he stopped drinking ~ 150 d ago.  Attends AA.     Presented to [facility] triage 4 days ago w progressive weakness.  Ruled in for influenza B at PCP office and had been started on Tamiflu, fevers resolved but continued weakness and anorexia. He had nausea and nonbloody emesis which has resolved.  At baseline has long-term history of depressed appetite but stable weight.  Yesterday evening and a couple of times since he passed dark, bloody stool.  The last episode was about 45 minutes ago at 11:15 AM.  This has never happened before and he denies recent constipation or straining with evacuation.  No rectal or abdominal pain, stomach just feels tight.  Compliant with pantoprazole daily.  Takes 81 aspirin daily but no other aspirin or NSAIDs.  Hgb 10.7 this morning, yesterday 10.6, 12.4 at arrival.  MCV 98.  Severe thrombocytopenia with platelets as low as 21K, 33K today.  INR 1.5 T bili 3.6 ... 3.1.  Alkaline phosphatase normal in 70s.  AST/ALT 106/27 .Marland Kitchen  94/28.  Lipase 70 BUNs/creatinine, GFR normal.  Na 133... 137.   CTAP w contrast: Cirrhosis with evidence for portal hypertension.  Innumerable small liver nodules probably regenerative, no definitive HCC.  Recanalization of umbilical vein and small anterior abdominal vascular collaterals.  Avascular necrosis right femoral head.  Cholelithiasis without complication.  Unremarkable pancreas and pancreatic duct.  Spleen unremarkable Abdominal ultrasound shows changes of cirrhosis without obvious liver lesions.  Cholelithiasis and mild, diffuse GB wall thickening. 2D echo shows severe aortic valve calcification and thickening, mild aortic valve stenosis but no regurgitation.  LVEF 60 to 65%.  Normal pulmonary pressures.  Trivial mitral regurgitation and mitral valve annular calcification.   Family history of father with alcoholic cirrhosis. Lives w his wife.    Past Medical History:  Diagnosis Date   Alcohol addiction ([place])    Anxiety    Chronic fatigue    Colon polyps    Depression    GERD  (gastroesophageal reflux disease)    Hemochromatosis    Hx of blood clots    Leg   Hyperreflexia    Hypertension    PTSD (post-traumatic stress disorder)    Traumatic hemorrhagic shock (HCC)    Ulcerative colitis ([place])     Past Surgical History:  Procedure Laterality Date   CARPAL TUNNEL RELEASE Bilateral    FRACTURE SURGERY     left ankle plate   HERNIA REPAIR     inguinal   KNEE SURGERY Right    x 4   SHOULDER SURGERY Bilateral    x 2   VASECTOMY      Prior to Admission medications   Medication Sig Start Date End Date Taking? Authorizing Provider  aspirin 81 MG chewable tablet Chew 81 mg by mouth every morning.   Yes [provider]  B Complex Vitamins (B COMPLEX PO) Take 1 tablet by mouth every morning.   Yes [provider]  busPIRone (BUSPAR) 10 MG tablet Take 20 mg by mouth 3 (three) times daily.   Yes [provider]  Cholecalciferol 50 MCG (2000 UT) CAPS Take 2,000 Units by mouth every morning.   Yes [provider]  EPINEPHrine 0.3 mg/0.3 mL IJ SOAJ injection INJECT 0.3 MG INTO THE MUSCLE AS NEEDED FOR ANAPHYLAXIS. Patient taking differently: Inject 0.3 mg into the muscle once as needed for anaphylaxis (severe allergic reaction). 04/08/21 04/08/22 Yes Hali Marry, MD  folic acid (FOLVITE) 1 MG tablet TAKE 1 TABLET (1 MG TOTAL) BY MOUTH DAILY. Patient taking differently: Take 1 mg by mouth every morning. 06/22/21 06/22/22 Yes Silverio Decamp, MD  Magnesium Oxide 400 (240 Mg) MG TABS Take 400 mg by mouth every morning.   Yes [provider]  Multiple Vitamins-Minerals (YOUR LIFE MULTI ADULT GUMMIES) CHEW Chew 1 tablet by mouth daily.   Yes [provider]  ondansetron (ZOFRAN-ODT) 8 MG disintegrating tablet Dissolve 1 tablet (8 mg total) by mouth every 8 (eight) hours as needed for nausea. 07/14/21  Yes Caleen Jobs B, NP  pantoprazole (PROTONIX) 40 MG tablet TAKE 1 TABLET (40 MG TOTAL) BY MOUTH  DAILY. Patient taking differently: Take 40 mg by mouth every morning. 06/22/21 06/22/22 Yes Silverio Decamp, MD  prazosin (MINIPRESS) 2 MG capsule Take 4 mg by mouth at bedtime. 05/28/21  Yes [provider]  saccharomyces boulardii (FLORASTOR) 250 MG capsule Take 250 mg by mouth 2 (two) times daily.    Yes [provider]  amLODipine (NORVASC) 2.5 MG tablet Take 1 tablet (2.5 mg total) by mouth daily. Patient not taking: Reported on 09/18/2021 06/02/21   [name], DO  busPIRone (BUSPAR) 15 MG tablet Take 15 mg by mouth See admin instructions. Take one tablet (15 mg) by mouth twice daily - 9am and 6pm Patient not taking: Reported  on 09/21/2021 08/27/21   [provider]  clotrimazole-betamethasone (LOTRISONE) cream Apply 1 application topically 2 (two) times daily. Patient not taking: Reported on 09/18/2021 05/12/21   Monica Bectonhekkekandam, Thomas J, MD  DULoxetine (CYMBALTA) 20 MG capsule Take 20 mg by mouth daily. Patient not taking: Reported on 09/21/2021    [provider]  oseltamivir (TAMIFLU) 75 MG capsule Take 75 mg by mouth 2 (two) times daily. Patient not taking: Reported on 09/21/2021    [provider]  thiamine 100 MG tablet Take 100 mg by mouth daily. Patient not taking: Reported on 09/21/2021 11/14/19   [provider]  vortioxetine HBr (TRINTELLIX) 10 MG TABS tablet Take 1 tablet (10 mg total) by mouth daily. Patient not taking: Reported on 09/18/2021 09/02/21   Everrett CoombeMatthews, Cody, DO    Scheduled Meds:  busPIRone  15 mg Oral BID   folic acid  1 mg Oral Daily   magnesium oxide  400 mg Oral Daily   pantoprazole  40 mg Oral Daily   thiamine  100 mg Oral Daily   vortioxetine HBr  10 mg Oral Daily   Infusions:  PRN Meds: acetaminophen **OR** acetaminophen, albuterol, hydrALAZINE, ondansetron **OR** ondansetron (ZOFRAN) IV, senna   Allergies as of 09/18/2021 - Review Complete 09/18/2021  Allergen Reaction Noted   Charentais melon  (french melon) Anaphylaxis 03/07/2018   Cucumber extract Itching and Nausea And Vomiting 10/16/2013   Other Anaphylaxis 10/16/2013   Peanut butter flavor Anaphylaxis and Swelling 10/16/2013   Peanut oil Swelling 10/16/2013   Shellfish allergy Itching and Swelling 10/16/2013   Cantaloupe extract allergy skin test Rash 12/30/2017   Lactose Other (See Comments) 12/30/2017   Strawberry extract Nausea And Vomiting and Swelling 10/16/2013   Vancomycin Rash 12/29/2018   Apple Swelling 05/07/2021   Codeine Itching 11/10/2017   Depakote er [divalproex sodium er]  11/10/2017    Family History  Problem Relation Age of Onset   Pulmonary fibrosis Mother    Other Father        liver failure   Breast cancer Paternal Aunt     Social History   Socioeconomic History   Marital status: Married    Spouse name: [name]   Number of children: 2   Years of education: Not on file   Highest education level: High school graduate  Occupational History    Comment: disability  Tobacco Use   Smoking status: Never   Smokeless tobacco: Current    Types: Snuff  Vaping Use   Vaping Use: Never used  Substance and Sexual Activity   Alcohol use: Not Currently   Drug use: Never   Sexual activity: Not on file  Other Topics Concern   Not on file  Social History Narrative   Lives with wife   Social Determinants of Health   Financial Resource Strain: Not on file  Food Insecurity: Not on file  Transportation Needs: Not on file  Physical Activity: Not on file  Stress: Not on file  Social Connections: Not on file  Intimate Partner Violence: Not on file    REVIEW OF SYSTEMS: Constitutional: Fatigue, weakness. ENT:  No nose bleeds Pulm: Cough, shortness of breath improved. CV:  No palpitations, no LE edema.  No angina. GU:  No hematuria, no frequency GI: See HPI. Heme: Other than the rectal bleeding, denies unusual or excessive bleeding. Transfusions: None Neuro:  No headaches, no peripheral  tingling or numbness.  No syncope, no seizures. Derm:  No itching, no rash or sores.  Endocrine:  No sweats or chills.  No polyuria or dysuria Immunization: Has not gotten a flu vaccine for this current season.  Has been vaccinated for COVID. Travel:  None beyond local counties in last few months.    PHYSICAL EXAM: Vital signs in last 24 hours: Vitals:   09/21/21 0721 09/21/21 0944  BP:  105/79  Pulse:  89  Resp: 18 16  Temp:  98 F (36.7 C)  SpO2:  98%   Wt Readings from Last 3 Encounters:  09/18/21 74.5 kg  09/02/21 73.9 kg  07/30/21 78.5 kg    General: Moderately chronically ill-appearing.  Comfortable, alert. Head: No signs of head trauma.  No facial asymmetry or swelling. Eyes: Conjunctiva pink.  No scleral icterus. Ears: Not hard of hearing Nose: No congestion or discharge Mouth: Moist, pink, clear oral mucosa.  Tongue midline.  Fair dentition, missing teeth. Neck: No JVD, no masses, no thyromegaly Lungs: No labored breathing or cough.  Lungs clear bilaterally. Heart: RRR.  2/6 systolic murmur.  S1, S2 present Abdomen: Soft, mildly distended.  Not tender.  No HSM, masses, bruits, hernias appreciated..   Rectal: India jellylike blood, small quantity on exam glove.  No masses, no visible or palpable hemorrhoids. Musc/Skeltl: No joint redness, swelling. Extremities: Nonpitting mild pedal edema. Neurologic: Oriented x3.  Able to move all 4 limbs but strength not tested.  No tremors. Skin: No sores, suspicious lesions or rashes. Nodes: No cervical adenopathy Psych: Pleasant, calm.  Fluid speech.  Intake/Output from previous day: 01/29 0701 - 01/30 0700 In: 600 [P.O.:600] Out: 2025 [Urine:2025] Intake/Output this shift: No intake/output data recorded.  LAB RESULTS: Recent Labs    09/19/21 0306 09/20/21 0347 09/21/21 0137  WBC 4.0 4.3 4.5  HGB 10.5* 10.6* 10.7*  HCT 28.4* 29.7* 29.1*  PLT 24* 28* 33*   BMET Lab Results  Component Value Date   NA 137  09/21/2021   NA 136 09/20/2021   NA 135 09/19/2021   K 3.5 09/21/2021   K 3.2 (L) 09/20/2021   K 3.2 (L) 09/19/2021   CL 102 09/21/2021   CL 104 09/20/2021   CL 100 09/19/2021   CO2 26 09/21/2021   CO2 25 09/20/2021   CO2 24 09/19/2021   GLUCOSE 115 (H) 09/21/2021   GLUCOSE 108 (H) 09/20/2021   GLUCOSE 101 (H) 09/19/2021   BUN <5 (L) 09/21/2021   BUN <5 (L) 09/20/2021   BUN <5 (L) 09/19/2021   CREATININE 0.81 09/21/2021   CREATININE 0.77 09/20/2021   CREATININE 0.98 09/19/2021   CALCIUM 8.5 (L) 09/21/2021   CALCIUM 7.6 (L) 09/20/2021   CALCIUM 7.5 (L) 09/19/2021   LFT Recent Labs    09/18/21 1722 09/19/21 0306 09/20/21 0347 09/21/21 0137  PROT  --  6.3* 6.2* 6.2*  ALBUMIN  --  3.1* 3.0* 2.9*  AST  --  106* 101* 94*  ALT  --  27 29 28   ALKPHOS  --  73 74 73  BILITOT  --  3.6* 3.4* 3.1*  BILIDIR 1.3*  --   --   --    PT/INR Lab Results  Component Value Date   INR 1.5 (H) 09/18/2021   Hepatitis Panel No results for input(s): HEPBSAG, HCVAB, HEPAIGM, HEPBIGM in the last 72 hours. C-Diff No components found for: CDIFF Lipase     Component Value Date/Time   LIPASE 70 (H) 09/19/2021 0306    Drugs of Abuse  No results found for: LABOPIA, [place], LABBENZ, AMPHETMU, THCU, LABBARB  RADIOLOGY STUDIES: No results found.    IMPRESSION:      Hematochezia, burgundy stool is new and started last night.  Had what sounds like adenomatous colon polyp on colonoscopy 2 years ago.  Cirrhosis of the liver attributed to alcohol but also carries history of hemochromatosis.  Alcohol has been in remission for several months.  Still has elevated AST.  Severe thrombocytopenia.  No splenomegaly on CT or ultrasound.  Could contribute to spontaneous gastrointestinal bleeding.     GERD, symptoms well controlled with careful diet and daily PPI.  Acute nausea, nonbloody emesis associated with influenza have resolved patient tolerating regular diet    + influenza B.  Cough,  shortness of breath improved.  Received Tamiflu as outpatient.  Normocytic anemia.  No significant change in Hgb since GI bleeding began last night  CT findings consistent with portal hypertension.  No mention or patient recall of portal hypertensive gastropathy or varices on EGD 2 yrs ago.      PLAN:       At present platelets too low for EGD or colonoscopy.  Change Protonix to 40 mg bid.  ? Add octreotide?  Did not adjust diet.     Azucena Freed  09/21/2021, 11:30 AM Phone [phone]

## 2021-09-21 NOTE — Progress Notes (Signed)
No changes to previous assessment.  °

## 2021-09-21 NOTE — Progress Notes (Signed)
PROGRESS NOTE  [name]  DOB: 1966/08/03  PCP: [name], DO KWI:[id number]  DOA: 09/18/2021  LOS: 3 days  Hospital Day: 4  Chief Complaint  Patient presents with   Vomiting    Brief negative: Juan [name] is a 56 y.o. male with PMH significant for chronic alcoholism, liver cirrhosis, muscular dystrophy HTN, ulcerative colitis, DVT, traumatic hemorrhagic shock, anxiety, depression, PTSD, chronic fatigue. Patient presented to the ED last night with complaint of worsening fatigue. On 1/19, patient tested positive for flu B at PCPs office and was started on a course of Tamiflu. His fever subsided but continued to have weakness, anorexia.   He lives at home with his wife.  Last year, he was diagnosed with muscular dystrophy, states his neurologist at [facility] thinks it is related to alcoholism.  He quit drinking alcohol few months ago.  At home, uses a walker and a wheelchair. He and his family were aware he had fatty liver but not aware of liver cirrhosis.  On chart review it seems that CT scan of abdomen from August 2022 had mention liver cirrhosis.  Patient has a high screening colonoscopy in near future, has not had any EGD.  No history of GI blood loss.  In the ED, patient was afebrile, heart rate 111, blood pressure 97/64, breathing on room air Labs abnormal with sodium low at 130, potassium low at 2.6, serum bicarb low at 12, glucose elevated to 198, creatinine at 1.21, magnesium low at 1, lipase slightly up at 55, AST elevated to 107, total bilirubin elevated to 5.4, ammonia elevated to 114 WBC count low at 3.2, hemoglobin at 12.4 with MCV 96, platelets low at 21 Urinalysis with clear yellow urine, more than 80 ketones CT abdomen pelvis showed liver cirrhosis with portal hypertension, cholelithiasis.  Subjective: -Seen and examined this morning.  Lying on bed.  Not in distress.  Reports having bright red blood per rectum since last night.  He had hematuria yesterday  which is stopped.  Assessment/Plan: Acute GI bleeding -Had bright red blood per rectum since last night.  Probably related to liver cirrhosis and severe thrombocytopenia.  GI consulted.  May need intervention.  Severe thrombocytopenia -Platelet levels in 20s since admission.  Probably related to liver disease.  Not on aspirin or any blood thinner. -May need platelet transfusion if continues to have GI bleeding. Recent Labs  Lab 09/18/21 0057 09/18/21 1722 09/19/21 0306 09/20/21 0347 09/21/21 0137  PLT 21* PLATELET CLUMPS NOTED ON SMEAR, UNABLE TO ESTIMATE 24* 28* 33*    Liver cirrhosis with portal hypertension Hyperammonemia -History of chronic alcoholism -Presented with progressive fatigue, increased ammonia level, increased total bilirubin, slightly elevated liver enzymes.  No mention of ascites. -INR 1.5.  Ammonia level was elevated to 114.  Improved to normal with lactulose.  Lactulose was stopped after patient had frequent diarrhea.  Ammonia level improved to normal.  Hypokalemia/hypomagnesemia/hypophosphatemia -Since admission, patient has had severely low potassium, magnesium and phosphorus level.  Now gradually improving with aggressive repletion.  Labs from this morning with magnesium level still low at 1.6.  IV replacement ordered. -Continue to monitor. Recent Labs  Lab 09/18/21 0057 09/18/21 0205 09/18/21 1722 09/19/21 0306 09/20/21 0347 09/21/21 0137  K 2.6* 2.9* 2.8* 3.2* 3.2* 3.5  MG 1.0*  --   --  1.5* 1.4* 1.6*  PHOS  --   --  <1.0* <1.0* 2.3* 2.5    Hypocalcemia -Calcium level improved after calcium gluconate yesterday. Recent Labs  Lab 09/18/21  4128 09/18/21 1722 09/19/21 0306 09/20/21 0347 09/21/21 0137  CALCIUM 8.4* 7.2* 7.5* 7.6* 8.5*     Mild hematuria -Patient advised hematuria on 1/29.  Improved today.  Essential hypertension -Blood pressure in low normal range.  Keep home meds on hold  Depression -Continue home meds  Recent flu B  infection -Completed 5-day course of Tamiflu.  Currently does not have any respiratory symptoms. -Continue supportive care  Impaired mobility Muscular dystrophy -At baseline, patient uses walker and wheelchair because of muscular dystrophy  Mobility: PT eval obtained.  Outpatient PT recommended. Living condition: Lives at home with wife Goals of care:   Code Status: Full Code  Nutritional status: Body mass index is 27.33 kg/m.      Diet:  Diet Order             Diet regular Room service appropriate? Yes with Assist; Fluid consistency: Thin  Diet effective now                  DVT prophylaxis:  SCDs Start: 09/18/21 1720   Antimicrobials: None Fluid: Ordered IV fluid Consultants: None Family Communication: None at bedside today  Status is: Inpatient  Continue in-hospital care because: New issue of GI bleeding Level of care: Progressive   Dispo: The patient is from: Home              Anticipated d/c is to: Hopefully back to home in 1 to 2 days              Patient currently is not medically stable to d/c.   Difficult to place patient No     Infusions:     Scheduled Meds:  busPIRone  15 mg Oral BID   folic acid  1 mg Oral Daily   magnesium oxide  400 mg Oral Daily   pantoprazole  40 mg Oral BID   thiamine  100 mg Oral Daily   vortioxetine HBr  10 mg Oral Daily    PRN meds: acetaminophen **OR** acetaminophen, albuterol, hydrALAZINE, ondansetron **OR** ondansetron (ZOFRAN) IV, senna   Antimicrobials: Anti-infectives (From admission, onward)    None       Objective: Vitals:   09/21/21 0944 09/21/21 1139  BP: 105/79 108/75  Pulse: 89 88  Resp: 16 20  Temp: 98 F (36.7 C) 97.9 F (36.6 C)  SpO2: 98% 99%    Intake/Output Summary (Last 24 hours) at 09/21/2021 1308 Last data filed at 09/20/2021 1925 Gross per 24 hour  Intake 120 ml  Output 1125 ml  Net -1005 ml    Filed Weights   09/18/21 0021 09/18/21 1639  Weight: 73.9 kg 74.5 kg    Weight change:  Body mass index is 27.33 kg/m.   Physical Exam: General exam: Pleasant, middle-aged Caucasian male.  Not in physical distress Skin: No rashes, lesions or ulcers. HEENT: Atraumatic, normocephalic, no obvious bleeding Lungs: Clear to auscultation bilaterally CVS: Regular rate and rhythm, no murmur GI/Abd soft, nontender, nondistended, bowel sound present CNS: Alert, awake, oriented x3 Psychiatry: Mood appropriate Extremities: No pedal edema, no calf tenderness  Data Review: I have personally reviewed the laboratory data and studies available.  F/u labs ordered Unresulted Labs (From admission, onward)     Start     Ordered   09/20/21 0329  CBC with Differential/Platelet  ONCE - STAT,   STAT        09/20/21 0328            Signed, Terrilee Croak, MD  [facility] 09/21/2021

## 2021-09-21 NOTE — Consult Note (Signed)
° °  [facility] Inpatient Consult   09/21/2021  Tsuneo Faison [birth year] [id number]   [facility] [[facility]]  Accountable Care Organization [ACO] Patient: [place] plan  Primary Care Provider:  Everrett Coombe, DO [facility], Is listed to provide the Transition of Care calls and follow up  Plan: Referral to be placed for post hospital support and follow up with a [facility] Telephonic RN Care Coordinator. Will follow for progress and needs.  For additional questions or referrals please contact:   Charlesetta Shanks, RN BSN CCM [facility]  [phone] business mobile phone Toll free office [phone]  Fax number: [phone] [email or web address]

## 2021-09-21 NOTE — Progress Notes (Signed)
Nutrition Brief Note  Patient identified on the Malnutrition Screening Tool (MST) Report. Patient reports some weight loss over the past 1-2 months. His weight has stabilized over the past few weeks. He has been eating well.  Wt Readings from Last 15 Encounters:  09/18/21 74.5 kg  09/02/21 73.9 kg  07/30/21 78.5 kg  06/18/21 77.6 kg  06/06/21 78.5 kg  06/02/21 78.5 kg  05/07/21 80.6 kg  04/30/21 76.2 kg  04/17/21 76.5 kg  04/08/21 76.2 kg  04/06/21 78.5 kg  03/23/21 79.8 kg  01/06/21 78 kg  11/05/20 76.7 kg  10/30/20 74.8 kg    Body mass index is 27.33 kg/m. Patient meets criteria for overweight based on current BMI.   Current diet order is Regular, patient is consuming approximately 75-100% of meals at this time. Labs and medications reviewed.   No nutrition interventions warranted at this time. If nutrition issues arise, please consult RD.   Gabriel Rainwater RD, LDN, CNSC Please refer to Amion for contact information.

## 2021-09-22 ENCOUNTER — Ambulatory Visit: Payer: 59 | Admitting: Rehabilitative and Restorative Service Providers"

## 2021-09-22 LAB — PROTIME-INR
INR: 1.7 — ABNORMAL HIGH (ref 0.8–1.2)
Prothrombin Time: 20.1 seconds — ABNORMAL HIGH (ref 11.4–15.2)

## 2021-09-22 LAB — FIBRINOGEN: Fibrinogen: 255 mg/dL (ref 210–475)

## 2021-09-22 LAB — CBC
HCT: 28.5 % — ABNORMAL LOW (ref 39.0–52.0)
Hemoglobin: 10.1 g/dL — ABNORMAL LOW (ref 13.0–17.0)
MCH: 35.2 pg — ABNORMAL HIGH (ref 26.0–34.0)
MCHC: 35.4 g/dL (ref 30.0–36.0)
MCV: 99.3 fL (ref 80.0–100.0)
Platelets: 42 10*3/uL — ABNORMAL LOW (ref 150–400)
RBC: 2.87 MIL/uL — ABNORMAL LOW (ref 4.22–5.81)
RDW: 17.2 % — ABNORMAL HIGH (ref 11.5–15.5)
WBC: 4.7 10*3/uL (ref 4.0–10.5)
nRBC: 0 % (ref 0.0–0.2)

## 2021-09-22 MED ORDER — PEG-KCL-NACL-NASULF-NA ASC-C 100 G PO SOLR: 1.0000 | Freq: Once | ORAL | Status: DC

## 2021-09-22 MED ORDER — PEG-KCL-NACL-NASULF-NA ASC-C 100 G PO SOLR
0.5000 | Freq: Once | ORAL | Status: AC
Start: 2021-09-23 — End: 2021-09-23
  Administered 2021-09-23: 100 g via ORAL

## 2021-09-22 MED ORDER — PEG-KCL-NACL-NASULF-NA ASC-C 100 G PO SOLR
0.5000 | Freq: Once | ORAL | Status: AC
  Administered 2021-09-23: 100 g via ORAL
  Filled 2021-09-22: qty 1

## 2021-09-22 NOTE — Progress Notes (Signed)
Progress Note   Subjective  Chief Complaint: Hematochezia  Patient has eaten today without nausea or vomiting. Appetite decreased.   He has had 2 BM's today, no blood in the stool.  Has AB bloating but no AB pain.  No fever, chills.     Objective   Vital signs in last 24 hours: Temp:  [98.1 F (36.7 C)-98.6 F (37 C)] 98.2 F (36.8 C) (01/31 1207) Pulse Rate:  [75-84] 75 (01/31 1207) Resp:  [16-18] 17 (01/31 1207) BP: (105-113)/(70-83) 110/71 (01/31 1207) SpO2:  [94 %-98 %] 95 % (01/31 1207) Last BM Date: 09/21/21  General:   Alert, well developed male in no acute distress. Eyes: sclerae anicteric,conjunctive pink  Heart:  regular rate and rhythm Pulm: Clear anteriorly; no wheezing Abdomen:  Soft, Obese AB,Normal bowel sounds. mild tenderness in the upper abdomen. Without guarding and Without rebound, . no  fluid wave, no  shifting dullness.  Extremities:  Without edema. Msk:  Symmetrical without gross deformities. Peripheral pulses intact.  Neurologic:  Alert and  oriented x4;  grossly normal neurologically. without asterixis or clonus.  Skin:  with jaundice. no palmar erythema or spider angioma.   Psychiatric: Demonstrates good judgement and reason without abnormal affect or behaviors.   Intake/Output from previous day: 01/30 0701 - 01/31 0700 In: 480 [P.O.:480] Out: -  Intake/Output this shift: Total I/O In: 240 [P.O.:240] Out: 100 [Urine:100]  Lab Results: Recent Labs    09/20/21 0347 09/21/21 0137 09/22/21 0240  WBC 4.3 4.5 4.7  HGB 10.6* 10.7* 10.1*  HCT 29.7* 29.1* 28.5*  PLT 28* 33* 42*   BMET Recent Labs    09/20/21 0347 09/21/21 0137  NA 136 137  K 3.2* 3.5  CL 104 102  CO2 25 26  GLUCOSE 108* 115*  BUN <5* <5*  CREATININE 0.77 0.81  CALCIUM 7.6* 8.5*   LFT Recent Labs    09/21/21 0137  PROT 6.2*  ALBUMIN 2.9*  AST 94*  ALT 28  ALKPHOS 73  BILITOT 3.1*   PT/INR Recent Labs    09/22/21 0240  LABPROT 20.1*  INR 1.7*     Studies/Results: No results found.    Impression/Plan:    Hematochezia HGB 10.1 MCV 99.3  WBC 4.7 Platelets 42 04/08/2021 Iron 138 Ferritin 153  ?adenomatous colon polyp on colonoscopy 2 years ago-patient states he is due, had recent call from [facility] compressing from to make an appointment for colonoscopy. Questionable diverticular bleed-discussed with the patient On vitamin K and and ABX for SBP prophylaxis. Patient currently having brown stools, no more evidence of bleeding, hemoglobin stable, possible diverticular bleed which was discussed with the patient, patient is interested in proceeding with endoscopy colonoscopy inpatient but unknown if this is needed to be done inpatient or can be done outpatient.. Will discuss with Dr. [name].   Cirrhosis of the liver ETOH/hemochromatosis.   Last known ETOH several months ago.  Still has elevated AST. AST 94 ALT 28  Alkphos 73 TBili 3.1 INR 09/22/2021 1.7  MELD-Na score: 17   Severe thrombocytopenia.   No splenomegaly on CT or ultrasound.   Could contribute to spontaneous gastrointestinal bleeding. Would need to have above 50 for procedures, can monitor CBC  CT findings consistent for portal hypertension EGD 2 years ago no varices Will need repeat endoscopy at some point, question whether it is needed this visit or not.   GERD, symptoms well controlled with careful diet and daily PPI.  Acute nausea, nonbloody emesis  associated with influenza have resolved patient tolerating regular diet   + influenza B.  Cough, shortness of breath improved.  Received Tamiflu as outpatient.   Normocytic anemia.  No significant change in Hgb, currently having brown stools       Future Appointments  Date Time Provider [place]  09/29/2021 10:15 AM Mervyn Gay, PT [facility]  12/04/2021 10:50 AM [name], DO [facility] None      LOS: 4 days   Juan [name]  09/22/2021, 2:15 PM

## 2021-09-22 NOTE — Progress Notes (Signed)
PROGRESS NOTE  Juan [name]  DOB: 07-Nov-1965  PCP: [name], DO MWU:[id number]  DOA: 09/18/2021  LOS: 4 days  Hospital Day: 5  Chief Complaint  Patient presents with   Vomiting    Brief negative: Juan [name] is a 56 y.o. male with PMH significant for chronic alcoholism, liver cirrhosis, muscular dystrophy HTN, ulcerative colitis, DVT, traumatic hemorrhagic shock, anxiety, depression, PTSD, chronic fatigue. Patient presented to the ED last night with complaint of worsening fatigue. On 1/19, patient tested positive for flu B at PCPs office and was started on a course of Tamiflu. His fever subsided but continued to have weakness, anorexia.   He lives at home with his wife.  Last year, he was diagnosed with muscular dystrophy, states his neurologist at [facility] thinks it is related to alcoholism.  He quit drinking alcohol few months ago.  At home, uses a walker and a wheelchair. He and his family were aware he had fatty liver but not aware of liver cirrhosis.  On chart review it seems that CT scan of abdomen from August 2022 had mention liver cirrhosis.  Patient has a high screening colonoscopy in near future, has not had any EGD.  No history of GI blood loss.  In the ED, patient was afebrile, heart rate 111, blood pressure 97/64, breathing on room air Labs abnormal with sodium low at 130, potassium low at 2.6, serum bicarb low at 12, glucose elevated to 198, creatinine at 1.21, magnesium low at 1, lipase slightly up at 55, AST elevated to 107, total bilirubin elevated to 5.4, ammonia elevated to 114 WBC count low at 3.2, hemoglobin at 12.4 with MCV 96, platelets low at 21 Urinalysis with clear yellow urine, more than 80 ketones CT abdomen pelvis showed liver cirrhosis with portal hypertension, cholelithiasis.  Subjective: -Seen and examined this morning.  Sitting up in bed.  Not in distress.  States that he did not have any blood in the stool this morning.  But he had 1  episode of blood in the urine. Labs this morning with hemoglobin slightly lower than yesterday at 10.1, platelet improving, 43 today.  Assessment/Plan: Acute GI bleeding -Had bright red blood per rectum on 1/30.  Probably secondary to hemorrhoidal bleed related to liver cirrhosis and severe thrombocytopenia.  May also have diverticulosis.  GI consulted.   Patient reports no blood in stool today.  Pending GI follow-up  Hematuria -Patient did complain of mild hematuria in 1/29 which is spontaneously improved.  Patient states that this morning he had hematuria again.  No clots.  No urinary retention.  Severe thrombocytopenia -Platelet levels in 20s since admission.  Probably related to liver disease.  Not on aspirin or any blood thinner. -In last 48 hours, lactate level has gradually improved from 28>>33>>42.   Recent Labs  Lab 09/18/21 0057 09/18/21 1722 09/19/21 0306 09/20/21 0347 09/21/21 0137 09/22/21 0240  PLT 21* PLATELET CLUMPS NOTED ON SMEAR, UNABLE TO ESTIMATE 24* 28* 33* 42*    Liver cirrhosis with portal hypertension Hyperammonemia -History of chronic alcoholism -Presented with progressive fatigue, increased ammonia level, increased total bilirubin, slightly elevated liver enzymes.  No mention of ascites. -INR 1.5.  Ammonia level was elevated to 114.  Improved to normal with lactulose.  Lactulose was stopped after patient had frequent diarrhea.  Ammonia level improved to normal.  Hypokalemia/hypomagnesemia/hypophosphatemia -Since admission, patient has had severely low potassium, magnesium and phosphorus level.   -Aggressively replaced.  Last set of labs was yesterday on 1/30.  Repeat labs tomorrow. Recent Labs  Lab 09/18/21 0057 09/18/21 0205 09/18/21 1722 09/19/21 0306 09/20/21 0347 09/21/21 0137  K 2.6* 2.9* 2.8* 3.2* 3.2* 3.5  MG 1.0*  --   --  1.5* 1.4* 1.6*  PHOS  --   --  <1.0* <1.0* 2.3* 2.5   Essential hypertension -Blood pressure in low normal range.   Keep home meds on hold  Depression -Continue home meds  Recent flu B infection -Completed 5-day course of Tamiflu.  Currently does not have any respiratory symptoms. -Continue supportive care  Impaired mobility Muscular dystrophy -At baseline, patient uses walker and wheelchair because of muscular dystrophy  Mobility: PT eval obtained.  Outpatient PT recommended. Living condition: Lives at home with wife Goals of care:   Code Status: Full Code  Nutritional status: Body mass index is 27.33 kg/m.      Diet:  Diet Order             Diet regular Room service appropriate? Yes with Assist; Fluid consistency: Thin  Diet effective now                  DVT prophylaxis:  SCDs Start: 09/18/21 1720   Antimicrobials: None Fluid: Not on IV fluid Consultants: None Family Communication: None at bedside today.  Status is: Inpatient  Continue in-hospital care because: New issue of GI bleeding Level of care: Progressive   Dispo: The patient is from: Home              Anticipated d/c is to: Hopefully back to home in 1 to 2 days              Patient currently is not medically stable to d/c.   Difficult to place patient No     Infusions:   cefTRIAXone (ROCEPHIN)  IV 1 g (09/21/21 2230)   phytonadione (VITAMIN K) IV 10 mg (09/22/21 1023)     Scheduled Meds:  busPIRone  15 mg Oral BID   folic acid  1 mg Oral Daily   influenza vac split quadrivalent PF  0.5 mL Intramuscular Tomorrow-1000   magnesium oxide  400 mg Oral Daily   pantoprazole  40 mg Oral BID   thiamine  100 mg Oral Daily   vortioxetine HBr  10 mg Oral Daily    PRN meds: acetaminophen **OR** acetaminophen, albuterol, hydrALAZINE, ondansetron **OR** ondansetron (ZOFRAN) IV, senna   Antimicrobials: Anti-infectives (From admission, onward)    Start     Dose/Rate Route Frequency Ordered Stop   09/21/21 2200  cefTRIAXone (ROCEPHIN) 1 g in sodium chloride 0.9 % 100 mL IVPB  Status:  Discontinued        1  g 200 mL/hr over 30 Minutes Intravenous Every 24 hours 09/21/21 2106 09/21/21 2107   09/21/21 2200  cefTRIAXone (ROCEPHIN) 1 g in sodium chloride 0.9 % 100 mL IVPB        1 g 200 mL/hr over 30 Minutes Intravenous Every 24 hours 09/21/21 2107 09/28/21 2159       Objective: Vitals:   09/22/21 0808 09/22/21 1207  BP: 105/82 110/71  Pulse: 83 75  Resp: 16 17  Temp: 98.6 F (37 C) 98.2 F (36.8 C)  SpO2: 98% 95%    Intake/Output Summary (Last 24 hours) at 09/22/2021 1343 Last data filed at 09/22/2021 1000 Gross per 24 hour  Intake 240 ml  Output 100 ml  Net 140 ml   Filed Weights   09/18/21 0021 09/18/21 1639  Weight: 73.9 kg  74.5 kg   Weight change:  Body mass index is 27.33 kg/m.   Physical Exam: General exam: Pleasant, middle-aged Caucasian male.  Not in physical distress Skin: No rashes, lesions or ulcers. HEENT: Atraumatic, normocephalic, no obvious bleeding Lungs: Clear to auscultation bilaterally CVS: Regular rate and rhythm, no murmur GI/Abd soft, nontender, nondistended, bowel sound present CNS: Alert, awake, oriented x3 Psychiatry: Mood appropriate Extremities: No pedal edema, no calf tenderness  Data Review: I have personally reviewed the laboratory data and studies available.  F/u labs ordered Unresulted Labs (From admission, onward)     Start     Ordered   09/23/21 0500  Magnesium  Tomorrow morning,   R        09/22/21 1202   09/23/21 0881  Basic metabolic panel  Tomorrow morning,   R        09/22/21 1343   09/22/21 0500  Miscellaneous [facility] test (send-out)  Tomorrow morning,   R       Question:  Test name / description:  Answer:  Phosphatidylethanol (PEth)   09/21/21 2058   09/20/21 0329  CBC with Differential/Platelet  ONCE - STAT,   STAT        09/20/21 0328            Signed, Terrilee Croak, MD [facility] 09/22/2021

## 2021-09-22 NOTE — Progress Notes (Signed)
PT Cancellation Note  Patient Details Name: Juan [name] MRN: [id number] DOB: August 06, 1966   Cancelled Treatment:    Reason Eval/Treat Not Completed: Patient declined, no reason specified. Patient reports he did not sleep last night. Declines PT at this time. Will re-attempt at later date.    Kameron Blethen 09/22/2021, 12:41 PM

## 2021-09-23 LAB — BASIC METABOLIC PANEL
Anion gap: 9 (ref 5–15)
BUN: 7 mg/dL (ref 6–20)
CO2: 24 mmol/L (ref 22–32)
Calcium: 8.4 mg/dL — ABNORMAL LOW (ref 8.9–10.3)
Chloride: 105 mmol/L (ref 98–111)
Creatinine, Ser: 0.76 mg/dL (ref 0.61–1.24)
GFR, Estimated: >60 mL/min (ref >60)
Glucose, Bld: 96 mg/dL (ref 70–99)
Potassium: 3.3 mmol/L — ABNORMAL LOW (ref 3.5–5.1)
Sodium: 138 mmol/L (ref 135–145)

## 2021-09-23 LAB — CBC
HCT: 29.7 % — ABNORMAL LOW (ref 39.0–52.0)
Hemoglobin: 10.1 g/dL — ABNORMAL LOW (ref 13.0–17.0)
MCH: 34.6 pg — ABNORMAL HIGH (ref 26.0–34.0)
MCHC: 34 g/dL (ref 30.0–36.0)
MCV: 101.7 fL — ABNORMAL HIGH (ref 80.0–100.0)
Platelets: 45 10*3/uL — ABNORMAL LOW (ref 150–400)
RBC: 2.92 MIL/uL — ABNORMAL LOW (ref 4.22–5.81)
RDW: 17.7 % — ABNORMAL HIGH (ref 11.5–15.5)
WBC: 4.6 10*3/uL (ref 4.0–10.5)
nRBC: 0 % (ref 0.0–0.2)

## 2021-09-23 LAB — ABO/RH: ABO/RH(D): B POS

## 2021-09-23 LAB — MAGNESIUM: Magnesium: 1.5 mg/dL — ABNORMAL LOW (ref 1.7–2.4)

## 2021-09-23 LAB — TYPE AND SCREEN
ABO/RH(D): B POS
Antibody Screen: NEGATIVE

## 2021-09-23 MED ORDER — MAGNESIUM SULFATE 4 GM/100ML IV SOLN
4.0000 g | Freq: Once | INTRAVENOUS | Status: AC
  Administered 2021-09-23: 4 g via INTRAVENOUS
  Filled 2021-09-23: qty 100

## 2021-09-23 MED ORDER — POTASSIUM CHLORIDE CRYS ER 20 MEQ PO TBCR
40.0000 meq | EXTENDED_RELEASE_TABLET | Freq: Once | ORAL | Status: AC
  Administered 2021-09-23: 40 meq via ORAL
  Filled 2021-09-23: qty 2

## 2021-09-23 MED ORDER — METOCLOPRAMIDE HCL 5 MG/ML IJ SOLN
10.0000 mg | Freq: Four times a day (QID) | INTRAMUSCULAR | Status: AC
  Administered 2021-09-23 – 2021-09-24 (×2): 10 mg via INTRAVENOUS
  Filled 2021-09-23 (×2): qty 2

## 2021-09-23 NOTE — Progress Notes (Signed)
PROGRESS NOTE  Juan [name]  DOB: Nov 28, 1965  PCP: [name], DO ZWC:[id number]  DOA: 09/18/2021  LOS: 5 days  Hospital Day: 6  Chief Complaint  Patient presents with   Vomiting    Brief negative: Juan [name] is a 56 y.o. male with PMH significant for chronic alcoholism, liver cirrhosis, muscular dystrophy HTN, ulcerative colitis, DVT, traumatic hemorrhagic shock, anxiety, depression, PTSD, chronic fatigue. Patient presented to the ED last night with complaint of worsening fatigue. On 1/19, patient tested positive for flu B at PCPs office and was started on a course of Tamiflu. His fever subsided but continued to have weakness, anorexia.   He lives at home with his wife.  Last year, he was diagnosed with muscular dystrophy, states his neurologist at [facility] thinks it is related to alcoholism.  He quit drinking alcohol few months ago.  At home, uses a walker and a wheelchair. He and his family were aware he had fatty liver but not aware of liver cirrhosis.  On chart review it seems that CT scan of abdomen from August 2022 had mention liver cirrhosis.  Patient has a high screening colonoscopy in near future, has not had any EGD.  No history of GI blood loss.  In the ED, patient was afebrile, heart rate 111, blood pressure 97/64, breathing on room air Labs abnormal with sodium low at 130, potassium low at 2.6, serum bicarb low at 12, glucose elevated to 198, creatinine at 1.21, magnesium low at 1, lipase slightly up at 55, AST elevated to 107, total bilirubin elevated to 5.4, ammonia elevated to 114 WBC count low at 3.2, hemoglobin at 12.4 with MCV 96, platelets low at 21 Urinalysis with clear yellow urine, more than 80 ketones CT abdomen pelvis showed liver cirrhosis with portal hypertension, cholelithiasis.  Subjective: Reports that he started having vomiting after having Jell-O earlier today Has not had any blood per rectum in over 24 hours  Assessment/Plan: Acute GI  bleeding -Patient was having bright red blood per rectum, but reports last episode was approximately 2 days ago, probably related to liver cirrhosis and severe thrombocytopenia.  GI consulted.  Plans are for EGD/colonoscopy in a.m.  Severe thrombocytopenia -Platelet levels in 20s since admission.  Probably related to liver disease.  Not on aspirin or any blood thinner. -Platelet count slowly improving -May need a unit of platelets prior to colonoscopy tomorrow if platelet count less than 50K Recent Labs  Lab 09/18/21 0057 09/18/21 1722 09/19/21 0306 09/20/21 0347 09/21/21 0137 09/22/21 0240 09/23/21 0133  PLT 21* PLATELET CLUMPS NOTED ON SMEAR, UNABLE TO ESTIMATE 24* 28* 33* 42* 45*    Liver cirrhosis with portal hypertension Hyperammonemia -History of chronic alcoholism -Presented with progressive fatigue, increased ammonia level, increased total bilirubin, slightly elevated liver enzymes.  No mention of ascites. -INR 1.5.  Ammonia level was elevated to 114.  Improved to normal with lactulose.  Lactulose was stopped after patient had frequent diarrhea.  Ammonia level improved to normal. -He is on antibiotics for SBP prophylaxis  Hypokalemia/hypomagnesemia/hypophosphatemia -Since admission, patient has had severely low potassium, magnesium and phosphorus level.  Now gradually improving with aggressive repletion.  -Continue to monitor. Recent Labs  Lab 09/18/21 0057 09/18/21 0205 09/18/21 1722 09/19/21 0306 09/20/21 0347 09/21/21 0137 09/23/21 0133  K 2.6*   < > 2.8* 3.2* 3.2* 3.5 3.3*  MG 1.0*  --   --  1.5* 1.4* 1.6* 1.5*  PHOS  --   --  <1.0* <1.0* 2.3* 2.5  --    < > =  values in this interval not displayed.   Hypocalcemia -Calcium level improved after calcium gluconate yesterday. Recent Labs  Lab 09/18/21 1722 09/19/21 0306 09/20/21 0347 09/21/21 0137 09/23/21 0133  CALCIUM 7.2* 7.5* 7.6* 8.5* 8.4*    Mild hematuria -Patient advised hematuria on 1/29.   Improved today.  Essential hypertension -Blood pressure in low normal range.  Keep home meds on hold  Depression -Continue home meds  Recent flu B infection -Completed 5-day course of Tamiflu.  Currently does not have any respiratory symptoms. -Continue supportive care  Impaired mobility Muscular dystrophy -At baseline, patient uses walker and wheelchair because of muscular dystrophy  Mobility: PT eval obtained.  Outpatient PT recommended. Living condition: Lives at home with wife Goals of care:   Code Status: Full Code  Nutritional status: Body mass index is 27.33 kg/m.      Diet:  Diet Order             Diet NPO time specified  Diet effective 0500 tomorrow           Diet clear liquid Room service appropriate? Yes; Fluid consistency: Thin  Diet effective midnight                  DVT prophylaxis:  SCDs Start: 09/18/21 1720   Antimicrobials: None Fluid: Ordered IV fluid Consultants: Gastroenterology Family Communication: None at bedside today  Status is: Inpatient  Continue in-hospital care because: New issue of GI bleeding Level of care: Progressive   Dispo: The patient is from: Home              Anticipated d/c is to: Hopefully back to home in 1 to 2 days              Patient currently is not medically stable to d/c.   Difficult to place patient No     Infusions:   cefTRIAXone (ROCEPHIN)  IV 1 g (09/22/21 2124)    Scheduled Meds:  busPIRone  15 mg Oral BID   folic acid  1 mg Oral Daily   influenza vac split quadrivalent PF  0.5 mL Intramuscular Tomorrow-1000   magnesium oxide  400 mg Oral Daily   metoCLOPramide (REGLAN) injection  10 mg Intravenous Q6H   pantoprazole  40 mg Oral BID   peg 3350 powder  0.5 kit Oral Once   thiamine  100 mg Oral Daily   vortioxetine HBr  10 mg Oral Daily    PRN meds: acetaminophen **OR** acetaminophen, albuterol, hydrALAZINE, ondansetron **OR** ondansetron (ZOFRAN) IV, senna    Antimicrobials: Anti-infectives (From admission, onward)    Start     Dose/Rate Route Frequency Ordered Stop   09/21/21 2200  cefTRIAXone (ROCEPHIN) 1 g in sodium chloride 0.9 % 100 mL IVPB  Status:  Discontinued        1 g 200 mL/hr over 30 Minutes Intravenous Every 24 hours 09/21/21 2106 09/21/21 2107   09/21/21 2200  cefTRIAXone (ROCEPHIN) 1 g in sodium chloride 0.9 % 100 mL IVPB        1 g 200 mL/hr over 30 Minutes Intravenous Every 24 hours 09/21/21 2107 09/28/21 2159       Objective: Vitals:   09/23/21 1438 09/23/21 2030  BP: 106/77 115/80  Pulse:  72  Resp: 18 16  Temp:  97.9 F (36.6 C)  SpO2: 96% 98%    Intake/Output Summary (Last 24 hours) at 09/23/2021 2050 Last data filed at 09/23/2021 2034 Gross per 24 hour  Intake 650 ml  Output 228 ml  Net 422 ml   Filed Weights   09/18/21 0021 09/18/21 1639  Weight: 73.9 kg 74.5 kg   Weight change:  Body mass index is 27.33 kg/m.   Physical Exam: General exam: Pleasant, middle-aged Caucasian male.  Not in physical distress Skin: No rashes, lesions or ulcers. HEENT: Atraumatic, normocephalic, no obvious bleeding Lungs: Clear to auscultation bilaterally CVS: Regular rate and rhythm, no murmur GI/Abd soft, nontender, nondistended, bowel sound present CNS: Alert, awake, oriented x3 Psychiatry: Mood appropriate Extremities: No pedal edema, no calf tenderness  Data Review: I have personally reviewed the laboratory data and studies available.  F/u labs ordered Unresulted Labs (From admission, onward)     Start     Ordered   09/24/21 0500  Prepare platelet pheresis  (Adult Blood Administration - Platelets (Pheresed))  Once,   R       Question Answer Comment  Number of Apheresis Units 1 unit   Transfusion Indications Surgery with PLT count <= 50,000/mm   Date/Time blood product needed For hold only If platelets are less then 50,000 in the morning before procedure  If emergent release call blood bank Not emergent  release      09/23/21 1553   09/24/21 0300  CBC with Differential/Platelet  Daily,   R       Comments: Need to try to get earlier so we know to give platelets or not. Thanks    09/23/21 1546   09/22/21 0500  Miscellaneous [facility] test (send-out)  Tomorrow morning,   R       Question:  Test name / description:  Answer:  Phosphatidylethanol (PEth)   09/21/21 1674            Signed, Kathie Dike, MD [facility] 09/23/2021

## 2021-09-23 NOTE — H&P (View-Only) (Signed)
Daily Rounding Note  09/23/2021, 11:02 AM  LOS: 5 days   SUBJECTIVE:   Chief complaint:    Cirrhosis.  Hematochezia.  Acute nausea and nonbloody emesis this morning after eating Jell-O which she said was very sweet and seem to trigger the nausea.  He was not nauseous before this morning.  3 bowel movements yesterday, 1 this morning.  OBJECTIVE:         Vital signs in last 24 hours:    Temp:  [97.5 F (36.4 C)-98.3 F (36.8 C)] 98.3 F (36.8 C) (02/01 0827) Pulse Rate:  [70-78] 71 (02/01 0827) Resp:  [15-19] 19 (02/01 0817) BP: (88-114)/(60-78) 114/78 (02/01 0827) SpO2:  [95 %-98 %] 97 % (02/01 0827) Last BM Date: 09/22/21 Filed Weights   09/18/21 0021 09/18/21 1639  Weight: 73.9 kg 74.5 kg   General: Chronically ill-appearing, vomited watery medium to light brown material while I was in the room.  Blood in the emesis Heart: RRR with murmur Chest: Clear bilaterally Abdomen: Soft, nontender, active bowel sounds.  Slightly distended. Extremities: No CCE Neuro/Psych: Alert.  Oriented x3.  Fluid speech.  Intake/Output from previous day: 01/31 0701 - 02/01 0700 In: 820 [P.O.:720; IV Piggyback:100] Out: 100 [Urine:100]  Intake/Output this shift: No intake/output data recorded.  Lab Results: Recent Labs    09/21/21 0137 09/22/21 0240 09/23/21 0133  WBC 4.5 4.7 4.6  HGB 10.7* 10.1* 10.1*  HCT 29.1* 28.5* 29.7*  PLT 33* 42* 45*   BMET Recent Labs    09/21/21 0137 09/23/21 0133  NA 137 138  K 3.5 3.3*  CL 102 105  CO2 26 24  GLUCOSE 115* 96  BUN <5* 7  CREATININE 0.81 0.76  CALCIUM 8.5* 8.4*   LFT Recent Labs    09/21/21 0137  PROT 6.2*  ALBUMIN 2.9*  AST 94*  ALT 28  ALKPHOS 73  BILITOT 3.1*   PT/INR Recent Labs    09/22/21 0240  LABPROT 20.1*  INR 1.7*   Hepatitis Panel No results for input(s): HEPBSAG, HCVAB, HEPAIGM, HEPBIGM in the last 72 hours.  Studies/Results: No results  found.  Scheduled Meds:  busPIRone  15 mg Oral BID   folic acid  1 mg Oral Daily   influenza vac split quadrivalent PF  0.5 mL Intramuscular Tomorrow-1000   magnesium oxide  400 mg Oral Daily   pantoprazole  40 mg Oral BID   peg 3350 powder  0.5 kit Oral Once   And   peg 3350 powder  0.5 kit Oral Once   thiamine  100 mg Oral Daily   vortioxetine HBr  10 mg Oral Daily   Continuous Infusions:  cefTRIAXone (ROCEPHIN)  IV 1 g (09/22/21 2124)   magnesium sulfate bolus IVPB 4 g (09/23/21 1025)   phytonadione (VITAMIN K) IV Stopped (09/22/21 1123)   PRN Meds:.acetaminophen **OR** acetaminophen, albuterol, hydrALAZINE, ondansetron **OR** ondansetron (ZOFRAN) IV, senna   ASSESMENT:    Painless hematochezia.  Question diverticular bleed.  Unable to obtain reports of 09/2019 colonoscopy and EGD where he had what sounds like adenomatous polyp and Dx GERD.  Nonbloody emesis starting this morning.  Hopefully this resolves so that he can complete bowel prep.   Thrombocytopenia, severe.  No splenomegaly.  Anemia.  Initially normocytic now slightly macrocytic.  Coagulopathy.  Despite vitamin K INR has gone from 1.5 to 1.7  Cirrhosis of liver, ETOH and hemochromatosis.  CT confirms cirrhosis and cholelithiasis.  No ascites on  CT or ultrasound.      ETOH use d/O, in remission for a few months.      Elevated lipase with no indication of pancreatitis.  Portal hypertension tension changes on CT.    Hypokalemia.  Hypomagnesemia.    Influenza B, Tamiflu PTA.    ETOH related myopathy.     PLAN     Given no ascites, ? Need for ongoing Rocephin SBP prophylaxis (day 3)?  Colonoscopy and EGD planned for 0815 tomorrow, bowel prep this afternoon and evening.  Check platelets tomorrow morning and give platelet transfusion if this area to get platelets above 50.      Await results of [facility] send out test.    Adding Reglan to prep orders.  Zofran now, has existing prn order.      Azucena Freed  09/23/2021, 11:02 AM Phone [phone]

## 2021-09-23 NOTE — Progress Notes (Signed)
Daily Rounding Note  09/23/2021, 11:02 AM  LOS: 5 days   SUBJECTIVE:   Chief complaint:    Cirrhosis.  Hematochezia.  Acute nausea and nonbloody emesis this morning after eating Jell-O which she said was very sweet and seem to trigger the nausea.  He was not nauseous before this morning.  3 bowel movements yesterday, 1 this morning.  OBJECTIVE:         Vital signs in last 24 hours:    Temp:  [97.5 F (36.4 C)-98.3 F (36.8 C)] 98.3 F (36.8 C) (02/01 0827) Pulse Rate:  [70-78] 71 (02/01 0827) Resp:  [15-19] 19 (02/01 0817) BP: (88-114)/(60-78) 114/78 (02/01 0827) SpO2:  [95 %-98 %] 97 % (02/01 0827) Last BM Date: 09/22/21 Filed Weights   09/18/21 0021 09/18/21 1639  Weight: 73.9 kg 74.5 kg   General: Chronically ill-appearing, vomited watery medium to light brown material while I was in the room.  Blood in the emesis Heart: RRR with murmur Chest: Clear bilaterally Abdomen: Soft, nontender, active bowel sounds.  Slightly distended. Extremities: No CCE Neuro/Psych: Alert.  Oriented x3.  Fluid speech.  Intake/Output from previous day: 01/31 0701 - 02/01 0700 In: 820 [P.O.:720; IV Piggyback:100] Out: 100 [Urine:100]  Intake/Output this shift: No intake/output data recorded.  Lab Results: Recent Labs    09/21/21 0137 09/22/21 0240 09/23/21 0133  WBC 4.5 4.7 4.6  HGB 10.7* 10.1* 10.1*  HCT 29.1* 28.5* 29.7*  PLT 33* 42* 45*   BMET Recent Labs    09/21/21 0137 09/23/21 0133  NA 137 138  K 3.5 3.3*  CL 102 105  CO2 26 24  GLUCOSE 115* 96  BUN <5* 7  CREATININE 0.81 0.76  CALCIUM 8.5* 8.4*   LFT Recent Labs    09/21/21 0137  PROT 6.2*  ALBUMIN 2.9*  AST 94*  ALT 28  ALKPHOS 73  BILITOT 3.1*   PT/INR Recent Labs    09/22/21 0240  LABPROT 20.1*  INR 1.7*   Hepatitis Panel No results for input(s): HEPBSAG, HCVAB, HEPAIGM, HEPBIGM in the last 72 hours.  Studies/Results: No results  found.  Scheduled Meds:  busPIRone  15 mg Oral BID   folic acid  1 mg Oral Daily   influenza vac split quadrivalent PF  0.5 mL Intramuscular Tomorrow-1000   magnesium oxide  400 mg Oral Daily   pantoprazole  40 mg Oral BID   peg 3350 powder  0.5 kit Oral Once   And   peg 3350 powder  0.5 kit Oral Once   thiamine  100 mg Oral Daily   vortioxetine HBr  10 mg Oral Daily   Continuous Infusions:  cefTRIAXone (ROCEPHIN)  IV 1 g (09/22/21 2124)   magnesium sulfate bolus IVPB 4 g (09/23/21 1025)   phytonadione (VITAMIN K) IV Stopped (09/22/21 1123)   PRN Meds:.acetaminophen **OR** acetaminophen, albuterol, hydrALAZINE, ondansetron **OR** ondansetron (ZOFRAN) IV, senna   ASSESMENT:    Painless hematochezia.  Question diverticular bleed.  Unable to obtain reports of 09/2019 colonoscopy and EGD where he had what sounds like adenomatous polyp and Dx GERD.  Nonbloody emesis starting this morning.  Hopefully this resolves so that he can complete bowel prep.   Thrombocytopenia, severe.  No splenomegaly.  Anemia.  Initially normocytic now slightly macrocytic.  Coagulopathy.  Despite vitamin K INR has gone from 1.5 to 1.7  Cirrhosis of liver, ETOH and hemochromatosis.  CT confirms cirrhosis and cholelithiasis.  No ascites on  CT or ultrasound.      ETOH use d/O, in remission for a few months.      Elevated lipase with no indication of pancreatitis.  Portal hypertension tension changes on CT.    Hypokalemia.  Hypomagnesemia.    Influenza B, Tamiflu PTA.    ETOH related myopathy.     PLAN     Given no ascites, ? Need for ongoing Rocephin SBP prophylaxis (day 3)?  Colonoscopy and EGD planned for 0815 tomorrow, bowel prep this afternoon and evening.  Check platelets tomorrow morning and give platelet transfusion if this area to get platelets above 50.      Await results of Peth send out test.    Adding Reglan to prep orders.  Zofran now, has existing prn order.      Juan [name]  09/23/2021, 11:02 AM Phone [phone]

## 2021-09-23 NOTE — Progress Notes (Signed)
Mobility Specialist: Progress Note   09/23/21 1449  Mobility  Activity Ambulated with assistance in hallway  Level of Assistance Standby assist, set-up cues, supervision of patient - no hands on  Assistive Device Four wheel walker  Distance Ambulated (ft) 470 ft  Activity Response Tolerated well  $Mobility charge 1 Mobility   Post-Mobility: 87 HR, 106/77 BP, 95% SpO2  Received pt in bed having no complaints and agreeable to mobility. To BR and then agreeable to ambulation. Asymptomatic throughout ambulation, returned back to bed w/ call bell in reach and all needs met.  [facility] Annelies Coyt Mobility Specialist Mobility Specialist 4 [place]: [phone] Mobility Specialist 2 [place] and [place]: [phone]
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
## 2021-09-24 ENCOUNTER — Encounter: Payer: Self-pay | Admitting: Internal Medicine

## 2021-09-24 ENCOUNTER — Encounter (HOSPITAL_COMMUNITY)
Admission: EM | Disposition: A | Discharge status: Home / Self Care | Payer: Self-pay | Source: Home / Self Care | Attending: Internal Medicine

## 2021-09-24 ENCOUNTER — Encounter (HOSPITAL_COMMUNITY): Payer: Self-pay | Admitting: Family Medicine

## 2021-09-24 ENCOUNTER — Telehealth: Payer: Self-pay

## 2021-09-24 ENCOUNTER — Other Ambulatory Visit: Payer: Self-pay

## 2021-09-24 ENCOUNTER — Inpatient Hospital Stay (HOSPITAL_COMMUNITY): Payer: 59 | Admitting: Certified Registered"

## 2021-09-24 DIAGNOSIS — E860 Dehydration: Secondary | ICD-10-CM

## 2021-09-24 DIAGNOSIS — K208 Other esophagitis without bleeding: Secondary | ICD-10-CM

## 2021-09-24 HISTORY — PX: COLONOSCOPY WITH PROPOFOL: SHX5780

## 2021-09-24 HISTORY — PX: BIOPSY: SHX5522

## 2021-09-24 HISTORY — PX: ESOPHAGOGASTRODUODENOSCOPY (EGD) WITH PROPOFOL: SHX5813

## 2021-09-24 LAB — BASIC METABOLIC PANEL
Anion gap: 8 (ref 5–15)
BUN: 6 mg/dL (ref 6–20)
CO2: 24 mmol/L (ref 22–32)
Calcium: 8.4 mg/dL — ABNORMAL LOW (ref 8.9–10.3)
Chloride: 107 mmol/L (ref 98–111)
Creatinine, Ser: 0.78 mg/dL (ref 0.61–1.24)
GFR, Estimated: >60 mL/min (ref >60)
Glucose, Bld: 99 mg/dL (ref 70–99)
Potassium: 3.8 mmol/L (ref 3.5–5.1)
Sodium: 139 mmol/L (ref 135–145)

## 2021-09-24 LAB — MAGNESIUM: Magnesium: 1.7 mg/dL (ref 1.7–2.4)

## 2021-09-24 LAB — CBC WITH DIFFERENTIAL/PLATELET
Abs Immature Granulocytes: 0.01 10*3/uL (ref 0.00–0.07)
Basophils Absolute: 0.1 10*3/uL (ref 0.0–0.1)
Basophils Relative: 1 %
Eosinophils Absolute: 0.1 10*3/uL (ref 0.0–0.5)
Eosinophils Relative: 2 %
HCT: 32.1 % — ABNORMAL LOW (ref 39.0–52.0)
Hemoglobin: 10.9 g/dL — ABNORMAL LOW (ref 13.0–17.0)
Immature Granulocytes: 0 %
Lymphocytes Relative: 27 %
Lymphs Abs: 1.5 10*3/uL (ref 0.7–4.0)
MCH: 34.6 pg — ABNORMAL HIGH (ref 26.0–34.0)
MCHC: 34 g/dL (ref 30.0–36.0)
MCV: 101.9 fL — ABNORMAL HIGH (ref 80.0–100.0)
Monocytes Absolute: 1.3 10*3/uL — ABNORMAL HIGH (ref 0.1–1.0)
Monocytes Relative: 24 %
Neutro Abs: 2.5 10*3/uL (ref 1.7–7.7)
Neutrophils Relative %: 46 %
Platelets: 68 10*3/uL — ABNORMAL LOW (ref 150–400)
RBC: 3.15 MIL/uL — ABNORMAL LOW (ref 4.22–5.81)
RDW: 17.3 % — ABNORMAL HIGH (ref 11.5–15.5)
WBC: 5.4 10*3/uL (ref 4.0–10.5)
nRBC: 0 % (ref 0.0–0.2)

## 2021-09-24 SURGERY — ESOPHAGOGASTRODUODENOSCOPY (EGD) WITH PROPOFOL
Anesthesia: Monitor Anesthesia Care

## 2021-09-24 MED ORDER — BUTAMBEN-TETRACAINE-BENZOCAINE 2-2-14 % EX AERO
INHALATION_SPRAY | CUTANEOUS | Status: DC | PRN
  Administered 2021-09-24: 1 via TOPICAL

## 2021-09-24 MED ORDER — PROPOFOL 10 MG/ML IV BOLUS
INTRAVENOUS | Status: DC | PRN
  Administered 2021-09-24: 30 mg via INTRAVENOUS
  Administered 2021-09-24: 20 mg via INTRAVENOUS

## 2021-09-24 MED ORDER — LACTATED RINGERS IV SOLN: INTRAVENOUS | Status: DC | PRN

## 2021-09-24 MED ORDER — PANTOPRAZOLE SODIUM 40 MG PO TBEC: 40.0000 mg | DELAYED_RELEASE_TABLET | Freq: Two times a day (BID) | ORAL | 1 refills | Status: DC

## 2021-09-24 MED ORDER — LIDOCAINE 2% (20 MG/ML) 5 ML SYRINGE
INTRAMUSCULAR | Status: DC | PRN
  Administered 2021-09-24: 20 mg via INTRAVENOUS

## 2021-09-24 MED ORDER — HYDROCORTISONE ACETATE 25 MG RE SUPP
25.0000 mg | Freq: Two times a day (BID) | RECTAL | Status: DC
  Administered 2021-09-24: 25 mg via RECTAL
  Filled 2021-09-24 (×2): qty 1

## 2021-09-24 MED ORDER — HYDROCORTISONE ACETATE 25 MG RE SUPP: 25.0000 mg | Freq: Two times a day (BID) | RECTAL | 0 refills | Status: DC

## 2021-09-24 MED ORDER — PROPOFOL 500 MG/50ML IV EMUL
INTRAVENOUS | Status: DC | PRN
  Administered 2021-09-24: 60 ug/kg/min via INTRAVENOUS

## 2021-09-24 MED ORDER — PHENYLEPHRINE HCL (PRESSORS) 10 MG/ML IV SOLN
INTRAVENOUS | Status: DC | PRN
  Administered 2021-09-24 (×3): 80 ug via INTRAVENOUS

## 2021-09-24 SURGICAL SUPPLY — 25 items

## 2021-09-24 NOTE — Telephone Encounter (Signed)
Per Dr. Libby Maw request, pt has been scheduled for hosp f/u on 10/21/21 @ 850am and repeat EGD @ [place] on 11/20/21 @ 330pm, arrival time 230pm. Appt reminders have been mailed. Appts will reflect on AVS upon hosp discharge for pt future reference. Amb referral placed for auth purposes and prep instructions generated for review with pt during hosp f/u appt.

## 2021-09-24 NOTE — Telephone Encounter (Signed)
-----   Message from Imogene Burn, MD sent at 09/24/2021  9:20 AM EST ----- Hi Billi Bright, please arrange for GI clinic follow up with me 4 weeks and repeat EGD in 8 weeks at [facility] for follow up of healing of esophagitis.  Thanks, Alan Ripper

## 2021-09-24 NOTE — Progress Notes (Signed)
Explained discharge instructions to patient to include his follow-up appoints and next med administration. Patient verbalized having an understanding. Removed both IVs. No additional needs assessed. Transported down to [facility] [employer]. Patient wasn't on telemetry at the time of discharge.

## 2021-09-24 NOTE — Anesthesia Postprocedure Evaluation (Signed)
Anesthesia Post Note  Patient: Amilio Zehnder  Procedure(s) Performed: ESOPHAGOGASTRODUODENOSCOPY (EGD) WITH PROPOFOL COLONOSCOPY WITH PROPOFOL BIOPSY     Patient location during evaluation: PACU Anesthesia Type: MAC Level of consciousness: awake and alert Pain management: pain level controlled Vital Signs Assessment: post-procedure vital signs reviewed and stable Respiratory status: spontaneous breathing, nonlabored ventilation, respiratory function stable and patient connected to nasal cannula oxygen Cardiovascular status: stable and blood pressure returned to baseline Postop Assessment: no apparent nausea or vomiting Anesthetic complications: no   No notable events documented.  Last Vitals:  Vitals:   09/24/21 0940 09/24/21 0951  BP: 121/73 119/88  Pulse:  68  Resp:  18  Temp:  (!) 36.4 C  SpO2:  99%    Last Pain:  Vitals:   09/24/21 1028  TempSrc:   PainSc: 0-No pain                 Effie Berkshire

## 2021-09-24 NOTE — Transfer of Care (Signed)
Immediate Anesthesia Transfer of Care Note  Patient: Juan [name]  Procedure(s) Performed: ESOPHAGOGASTRODUODENOSCOPY (EGD) WITH PROPOFOL COLONOSCOPY WITH PROPOFOL BIOPSY  Patient Location: Endoscopy Unit  Anesthesia Type:MAC  Level of Consciousness: awake, alert  and oriented  Airway & Oxygen Therapy: Patient connected to nasal cannula oxygen  Post-op Assessment: Post -op Vital signs reviewed and stable  Post vital signs: stable  Last Vitals:  Vitals Value Taken Time  BP 88/46 09/24/21 0917  Temp 36.7 C 09/24/21 0917  Pulse 80 09/24/21 0919  Resp 14 09/24/21 0919  SpO2 100 % 09/24/21 0919  Vitals shown include unvalidated device data.  Last Pain:  Vitals:   09/24/21 0917  TempSrc: Temporal  PainSc:          Complications: No notable events documented.

## 2021-09-24 NOTE — Anesthesia Preprocedure Evaluation (Addendum)
Anesthesia Evaluation  Patient identified by MRN, date of birth, ID band Patient awake    Reviewed: Allergy & Precautions, NPO status , Patient's Chart, lab work & pertinent test results  Airway Mallampati: I  TM Distance: >3 FB Neck ROM: Full    Dental  (+) Caps   Pulmonary    breath sounds clear to auscultation       Cardiovascular hypertension, Pt. on medications  Rhythm:Regular Rate:Normal     Neuro/Psych PSYCHIATRIC DISORDERS Anxiety Depression  Neuromuscular disease    GI/Hepatic Neg liver ROS, GERD  Medicated,  Endo/Other    Renal/GU negative Renal ROS     Musculoskeletal  (+) Arthritis ,   Abdominal Normal abdominal exam  (+)   Peds  Hematology negative hematology ROS (+)   Anesthesia Other Findings   Reproductive/Obstetrics                            Anesthesia Physical Anesthesia Plan  ASA: 2  Anesthesia Plan: MAC   Post-op Pain Management:    Induction: Intravenous  PONV Risk Score and Plan: 0 and Propofol infusion  Airway Management Planned:   Additional Equipment: None  Intra-op Plan:   Post-operative Plan:   Informed Consent: I have reviewed the patients History and Physical, chart, labs and discussed the procedure including the risks, benefits and alternatives for the proposed anesthesia with the patient or authorized representative who has indicated his/her understanding and acceptance.       Plan Discussed with: CRNA  Anesthesia Plan Comments:        Anesthesia Quick Evaluation

## 2021-09-24 NOTE — Progress Notes (Signed)
Mobility Specialist: Progress Note   09/24/21 1224  Mobility  Activity Refused mobility   Pt refused mobility d/t eating lunch earlier. Followed up and pt requesting to rest d/t not getting much sleep last night. Will f/u as time allows.   [facility] Juan [name] Mobility Specialist Mobility Specialist 4 [place]: [phone] Mobility Specialist 2 [place] and [address] [place]: [phone]
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
## 2021-09-24 NOTE — Interval H&P Note (Signed)
History and Physical Interval Note:  09/24/2021 7:49 AM  Juan [name]  has presented today for surgery, with the diagnosis of Anemia, hematochezia, history of cirrhosis.  The various methods of treatment have been discussed with the patient and family. After consideration of risks, benefits and other options for treatment, the patient has consented to  Procedure(s): ESOPHAGOGASTRODUODENOSCOPY (EGD) WITH PROPOFOL (N/A) COLONOSCOPY WITH PROPOFOL (N/A) as a surgical intervention.  The patient's history has been reviewed, patient examined, no change in status, stable for surgery.  I have reviewed the patient's chart and labs.  Questions were answered to the patient's satisfaction.     Sharyn Creamer

## 2021-09-24 NOTE — Op Note (Signed)
[facility] Patient Name: Juan [name] Procedure Date : 09/24/2021 MRN: [id number] Attending MD: Georgian Co ,  Date of Birth: Feb 05, 1966 CSN: [id number] Age: 56 Admit Type: Inpatient Procedure:                Upper GI endoscopy Indications:              Cirrhosis rule out esophageal varices Providers:                Adline Mango" Scheryl Darter RN, RN, Benetta Spar, Technician, Theodoro Grist, CRNA Referring MD:              Medicines:                Monitored Anesthesia Care Complications:            No immediate complications. Estimated Blood Loss:     Estimated blood loss was minimal. Procedure:                Pre-Anesthesia Assessment:                           - Prior to the procedure, a History and Physical                            was performed, and patient medications and                            allergies were reviewed. The patient's tolerance of                            previous anesthesia was also reviewed. The risks                            and benefits of the procedure and the sedation                            options and risks were discussed with the patient.                            All questions were answered, and informed consent                            was obtained. Prior Anticoagulants: The patient has                            taken no previous anticoagulant or antiplatelet                            agents. ASA Grade Assessment: III - A patient with                            severe systemic disease. After reviewing the risks  and benefits, the patient was deemed in                            satisfactory condition to undergo the procedure.                           After obtaining informed consent, the endoscope was                            passed under direct vision. Throughout the                            procedure, the patient's blood pressure, pulse, and                             oxygen saturations were monitored continuously. The                            [id number] Olympus endoscope was introduced                            through the mouth, and advanced to the second part                            of duodenum. The upper GI endoscopy was                            accomplished without difficulty. The patient                            tolerated the procedure well. Scope In: Scope Out: Findings:      LA Grade C (one or more mucosal breaks continuous between tops of 2 or       more mucosal folds, less than 75% circumference) esophagitis with no       bleeding was found in the lower third of the esophagus. Biopsies were       taken with a cold forceps for histology.      A medium-sized hiatal hernia was present.      Portal hypertensive gastropathy was found in the gastric body.      Localized erythematous mucosa without bleeding was found in the gastric       antrum. Biopsies were taken with a cold forceps for histology.      The examined duodenum was normal. Impression:               - LA Grade C esophagitis with no bleeding. Biopsied.                           - Medium-sized hiatal hernia.                           - Portal hypertensive gastropathy.                           - Erythematous mucosa in the antrum. Biopsied.                           -  Normal examined duodenum. Recommendation:           - Use a proton pump inhibitor PO BID for 8 weeks.                           - Await pathology results.                           - Repeat upper endoscopy in 8-12 weeks to check                            healing.                           - Perform a colonoscopy today. Procedure Code(s):        --- Professional ---                           [phone], Esophagogastroduodenoscopy, flexible,                            transoral; with biopsy, single or multiple Diagnosis Code(s):        --- Professional ---                            K20.90, Esophagitis, unspecified without bleeding                           K44.9, Diaphragmatic hernia without obstruction or                            gangrene                           K76.6, Portal hypertension                           K31.89, Other diseases of stomach and duodenum                           K74.60, Unspecified cirrhosis of liver CPT copyright 2019 American Medical Association. All rights reserved. The codes documented in this report are preliminary and upon coder review may  be revised to meet current compliance requirements. [address][name],  09/24/2021 8:56:42 AM Number of Addenda: 0

## 2021-09-24 NOTE — Op Note (Signed)
[facility] Patient Name: Juan [name] Procedure Date : 09/24/2021 MRN: [id number] Attending MD: Georgian Co ,  Date of Birth: 11/27/1965 CSN: [id number] Age: 56 Admit Type: Inpatient Procedure:                Colonoscopy Indications:              Hematochezia Providers:                Adline Mango" Scheryl Darter RN, RN, Benetta Spar, Technician, Theodoro Grist, CRNA Referring MD:              Medicines:                Monitored Anesthesia Care Complications:            No immediate complications. Estimated Blood Loss:     Estimated blood loss was minimal. Procedure:                Pre-Anesthesia Assessment:                           - Prior to the procedure, a History and Physical                            was performed, and patient medications and                            allergies were reviewed. The patient's tolerance of                            previous anesthesia was also reviewed. The risks                            and benefits of the procedure and the sedation                            options and risks were discussed with the patient.                            All questions were answered, and informed consent                            was obtained. Prior Anticoagulants: The patient has                            taken no previous anticoagulant or antiplatelet                            agents. ASA Grade Assessment: III - A patient with                            severe systemic disease. After reviewing the risks  and benefits, the patient was deemed in                            satisfactory condition to undergo the procedure.                           After obtaining informed consent, the colonoscope                            was passed under direct vision. Throughout the                            procedure, the patient's blood pressure, pulse, and                             oxygen saturations were monitored continuously. The                            [id number] Olympus colonoscope was                            introduced through the anus and advanced to the the                            terminal ileum. The colonoscopy was performed                            without difficulty. The patient tolerated the                            procedure well. The quality of the bowel                            preparation was excellent. The terminal ileum,                            ileocecal valve, appendiceal orifice, and rectum                            were photographed. Scope In: 8:58:40 AM Scope Out: 9:09:35 AM Scope Withdrawal Time: 0 hours 9 minutes 7 seconds  Total Procedure Duration: 0 hours 10 minutes 55 seconds  Findings:      The terminal ileum appeared normal.      A tattoo was seen in the sigmoid colon.      Non-bleeding internal hemorrhoids were found during retroflexion and       during perianal exam. The hemorrhoids were Grade III (internal       hemorrhoids that prolapse but require manual reduction). Impression:               - The examined portion of the ileum was normal.                           - A tattoo was seen in the sigmoid colon.                           -  Non-bleeding internal hemorrhoids.                           - No specimens collected. Recommendation:           - Return patient to hospital ward for ongoing care.                           - It is suspected that the patient's rectal                            bleeding was due to hemorrhoids.                           - Anusol HC BID for 7 days to help with hemorrhoids.                           - Return to GI clinic at appointment to be                            scheduled.                           - The findings and recommendations were discussed                            with the patient. Procedure Code(s):        --- Professional ---                           [phone],  Colonoscopy, flexible; diagnostic, including                            collection of specimen(s) by brushing or washing,                            when performed (separate procedure) Diagnosis Code(s):        --- Professional ---                           K64.2, Third degree hemorrhoids                           K92.1, Melena (includes Hematochezia) CPT copyright 2019 American Medical Association. All rights reserved. The codes documented in this report are preliminary and upon coder review may  be revised to meet current compliance requirements. Juan [name] "[name],  09/24/2021 9:17:28 AM Number of Addenda: 0

## 2021-09-24 NOTE — Progress Notes (Deleted)
Patient requested that his neighbor - Tyrone Apple - be allowed to share in his information - be able ask questions and so that she can help him and his wife understand things better.  Her phone # is [phone]. She has some medical background and is supportive to them--JM

## 2021-09-25 ENCOUNTER — Ambulatory Visit: Payer: 59 | Admitting: Physical Therapy

## 2021-09-28 ENCOUNTER — Encounter (HOSPITAL_COMMUNITY): Payer: Self-pay | Admitting: Internal Medicine

## 2021-09-28 LAB — SURGICAL PATHOLOGY

## 2021-09-29 ENCOUNTER — Ambulatory Visit: Payer: 59 | Admitting: Rehabilitative and Restorative Service Providers"

## 2021-09-30 LAB — MISC LABCORP TEST (SEND OUT): Labcorp test code: 791584

## 2021-10-21 ENCOUNTER — Ambulatory Visit: Payer: 59 | Admitting: Internal Medicine

## 2021-10-27 ENCOUNTER — Ambulatory Visit: Payer: 59 | Admitting: Internal Medicine

## 2021-10-27 ENCOUNTER — Encounter: Payer: Self-pay | Admitting: Internal Medicine

## 2021-10-27 ENCOUNTER — Other Ambulatory Visit (HOSPITAL_BASED_OUTPATIENT_CLINIC_OR_DEPARTMENT_OTHER): Payer: Self-pay

## 2021-10-27 VITALS — BP 126/84 | HR 89 | Ht 64.0 in | Wt 163.0 lb

## 2021-10-27 DIAGNOSIS — K209 Esophagitis, unspecified without bleeding: Secondary | ICD-10-CM | POA: Diagnosis not present

## 2021-10-27 DIAGNOSIS — K7460 Unspecified cirrhosis of liver: Secondary | ICD-10-CM | POA: Diagnosis not present

## 2021-10-27 DIAGNOSIS — R350 Frequency of micturition: Secondary | ICD-10-CM | POA: Diagnosis not present

## 2021-10-27 DIAGNOSIS — K7030 Alcoholic cirrhosis of liver without ascites: Secondary | ICD-10-CM | POA: Diagnosis not present

## 2021-10-27 DIAGNOSIS — R351 Nocturia: Secondary | ICD-10-CM | POA: Diagnosis not present

## 2021-10-27 DIAGNOSIS — Z23 Encounter for immunization: Secondary | ICD-10-CM | POA: Diagnosis not present

## 2021-10-27 MED ORDER — LACTULOSE 10 GM/15ML PO SOLN
20.0000 g | Freq: Every day | ORAL | 5 refills | Status: DC
Start: 2021-10-27 — End: 2022-06-25
  Filled 2021-10-27: qty 900, 30d supply, fill #0

## 2021-10-27 NOTE — Patient Instructions (Signed)
We have sent the following medications to your pharmacy for you to pick up at your convenience: Lactulose.  ? ?You have been referred to Nutrition Services with [facility]. They will contact you directly with an date and time.  ? ?We will send today's office note to Dr. Zigmund Daniel so patient can get Hepatitis A and Hepatitis B vaccines at his office.  ? ?The [facility] providers would like to encourage you to use [facility] to communicate with providers for non-urgent requests or questions.  Due to long hold times on the telephone, sending your provider a message by [facility] may be a faster and more efficient way to get a response.  Please allow 48 business hours for a response.  Please remember that this is for non-urgent requests.  ? ?Due to recent changes in healthcare laws, you may see the results of your imaging and laboratory studies on MyChart before your provider has had a chance to review them.  We understand that in some cases there may be results that are confusing or concerning to you. Not all laboratory results come back in the same time frame and the provider may be waiting for multiple results in order to interpret others.  Please give [place] 48 hours in order for your provider to thoroughly review all the results before contacting the office for clarification of your results.  ? ?

## 2021-10-27 NOTE — Progress Notes (Signed)
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  He has been doing well since he was discharged from the hospital when he first found out that he had alcoholic cirrhosis.  Has had intermittent episodes of nausea and vomiting (3 episodes of emesis in total since he was discharged), which he attributes to certain foods that he has been eating.  In particular he notices that red sauces seem to make his nausea and vomiting worse.  He has been painting and also eating a lot of Combos snacks, which he knows are not the best foods for him.  He states that he has not been drinking any alcohol.  Wife states that he has been a little confused.  He has been somewhat forgetful at home.  He has forgotten at times when he ordered groceries, and would order the same groceries again the next day.  He has had some swelling in his lower extremities.  Denies swelling in his abdomen.  Denies rectal bleeding.  He states that the hydrocortisone suppositories really helped with the hemorrhoids.  Denies melena.  Weight has been stable.  He is already scheduled for his upcoming EGD.  Has had some occasional abdominal cramping when he has episodes of diarrhea.  He has been taking his pantoprazole twice daily.  Wt Readings from Last 3 Encounters:  10/27/21 163 lb (73.9 kg)  09/18/21 164 lb 3.9 oz (74.5 kg)  09/02/21 163 lb (73.9 kg)    Past Medical History:  Diagnosis Date   Alcohol addiction ([place])    Anxiety    Chronic fatigue    Cirrhosis ([facility])    Colon polyps    Depression    GERD (gastroesophageal reflux disease)    Hemochromatosis    Hx of blood clots    Leg   Hyperreflexia    Hypertension    PTSD (post-traumatic stress disorder)    Traumatic hemorrhagic shock [facility])      Past Surgical History:  Procedure Laterality Date   BIOPSY   09/24/2021   Procedure: BIOPSY;  Surgeon: Sharyn Creamer, MD;  Location: [place];  Service: Gastroenterology;;   Wilmon Pali RELEASE Bilateral    COLONOSCOPY WITH PROPOFOL N/A 09/24/2021   Procedure: COLONOSCOPY WITH PROPOFOL;  Surgeon: Sharyn Creamer, MD;  Location: [place];  Service: Gastroenterology;  Laterality: N/A;   ESOPHAGOGASTRODUODENOSCOPY (EGD) WITH PROPOFOL N/A 09/24/2021   Procedure: ESOPHAGOGASTRODUODENOSCOPY (EGD) WITH PROPOFOL;  Surgeon: Sharyn Creamer, MD;  Location: [place];  Service: Gastroenterology;  Laterality: N/A;   FRACTURE SURGERY     left ankle plate   HERNIA REPAIR     inguinal   KNEE SURGERY Right    x 4   SHOULDER SURGERY Bilateral    x 2   VASECTOMY     Family History  Problem Relation Age of Onset   Pulmonary fibrosis Mother    Other Father        liver failure   Breast cancer Paternal Aunt    Social History   Tobacco Use   Smoking status: Never   Smokeless tobacco: Current    Types: Snuff  Vaping Use   Vaping Use: Never used  Substance Use Topics   Alcohol use: Not Currently   Drug use: Never   Current Outpatient Medications  Medication Sig Dispense Refill  aspirin 81 MG chewable tablet Chew 81 mg by mouth every morning.     B Complex Vitamins (B COMPLEX PO) Take 1 tablet by mouth every morning.     busPIRone (BUSPAR) 15 MG tablet Take 15 mg by mouth See admin instructions. Take one tablet (15 mg) by mouth twice daily - 9am and 6pm     Cholecalciferol 50 MCG (2000 UT) CAPS Take 2,000 Units by mouth every morning.     EPINEPHrine 0.3 mg/0.3 mL IJ SOAJ injection INJECT 0.3 MG INTO THE MUSCLE AS NEEDED FOR ANAPHYLAXIS. (Patient taking differently: Inject 0.3 mg into the muscle once as needed for anaphylaxis (severe allergic reaction).) 2 each 0   folic acid (FOLVITE) 1 MG tablet TAKE 1 TABLET (1 MG TOTAL) BY MOUTH DAILY. (Patient taking differently: Take 1 mg by mouth every morning.) 90 tablet 3   Magnesium Oxide 400 (240 Mg) MG  TABS Take 400 mg by mouth every morning.     Multiple Vitamins-Minerals (YOUR LIFE MULTI ADULT GUMMIES) CHEW Chew 1 tablet by mouth daily.     ondansetron (ZOFRAN-ODT) 8 MG disintegrating tablet Dissolve 1 tablet (8 mg total) by mouth every 8 (eight) hours as needed for nausea. 20 tablet 1   pantoprazole (PROTONIX) 40 MG tablet Take 1 tablet (40 mg total) by mouth 2 (two) times daily before a meal. 60 tablet 1   prazosin (MINIPRESS) 2 MG capsule Take 4 mg by mouth at bedtime.     saccharomyces boulardii (FLORASTOR) 250 MG capsule Take 250 mg by mouth 2 (two) times daily.      thiamine 100 MG tablet Take 100 mg by mouth daily.     vortioxetine HBr (TRINTELLIX) 10 MG TABS tablet Take 1 tablet (10 mg total) by mouth daily. 30 tablet 3   No current facility-administered medications for this visit.   Allergies  Allergen Reactions   Charentais Melon (French Melon) Anaphylaxis   Cucumber Extract Itching and Nausea And Vomiting    No extracts; just cucumber    Peanut Butter Flavor Anaphylaxis and Swelling   Peanut Oil Swelling   Shellfish Allergy Itching and Swelling   Cantaloupe Extract Allergy Skin Test Rash   Lactose Other (See Comments)    Lactose intolerant - causes indigestion   Strawberry Extract Nausea And Vomiting and Swelling   Vancomycin Rash   Apple Juice Swelling   Codeine Itching    itching   Depakote Er [Divalproex Sodium Er]     Tongue swelling     Review of Systems: All systems reviewed and negative except where noted in HPI.   Physical Exam: BP 126/84    Pulse 89    Ht 5' 4" (1.626 m)    Wt 163 lb (73.9 kg)    BMI 27.98 kg/m  Constitutional: Pleasant,well-developed, male in no acute distress. HEENT: Normocephalic and atraumatic. Conjunctivae are normal. Mild scleral icterus Cardiovascular: Systolic murmur Pulmonary/chest: Effort normal and breath sounds normal. No wheezing, rales or rhonchi. Abdominal: Soft, nondistended, nontender. Bowel sounds active throughout.  There are no masses palpable. No hepatomegaly. Extremities: 2+ BLE edema Neurological: Alert and oriented.  No asterixis Skin: Skin is warm and dry. No rashes noted. Psychiatric: Normal mood and affect. Behavior is normal.  Labs 03/2021: Normal iron, normal ferritin.  Labs 08/2021: Positive PeTH.  Had some issues with hematochezia at that time.  Last CBC with hemoglobin of 10.9 and platelets of 68.  Last BMP unremarkable.  Last set of LFTs shows low albumin of 2.9,  elevated AST of 94, ALT normal, normal alk phos, and elevated total bilirubin of 3.1.  RUQ U/S 09/18/21: IMPRESSION: 1. Echogenic liver with coarsened echotexture and nodular surface contour suggestive of cirrhosis. Evaluation of the liver parenchyma is limited by poor penetration. No obvious liver lesions identified. 2. Cholelithiasis and mild diffuse gallbladder wall thickening. Wall thickening could be secondary to underlying liver disease versus inflammation. No sonographic Percell Miller sign was evident on exam. If there is high clinical suspicion for cholecystitis, a nuclear medicine hepatobiliary scan may be helpful to further assess.  EGD 09/24/21: - LA Grade C esophagitis with no bleeding. Biopsied. - Medium-sized hiatal hernia. - Portal hypertensive gastropathy. - Erythematous mucosa in the antrum. Biopsied. - Normal examined duodenum. Path: . STOMACH, BIOPSY:  -  Reactive gastropathy with mild foveolar hyperplasia.  -  Suggestive of mild vascular ectasia, without thrombi.  -  Negative for an inflammatory infiltrate predictive of Helicobacter  pylori infection.  -  Negative for intestinal metaplasia.  -  Negative for malignancy.   B. ESOPHAGUS, BIOPSY:  -  Squamocolumnar mucosa with no specific pathologic diagnosis (scant  columnar epithelium present).  -  Negative for acute esophagitis and intrasquamous eosinophils.  -  Negative for intestinal metaplasia.  -  No viral cytopathic change or fungi identified (on HE).   -  Negative for dysplasia and malignancy  Colonoscopy 09/24/21: - The examined portion of the ileum was normal. - A tattoo was seen in the sigmoid colon. - Non-bleeding internal hemorrhoids.  ASSESSMENT AND PLAN: EtOH cirrhosis History of esophagitis Patient presents for follow-up after recently being diagnosed with alcoholic cirrhosis during a recent hospitalization a month ago.  He has been doing relatively, having intermittent nausea.  Does have some lower extremity swelling as well as memory issues/emotional lability.  We will start him on daily lactulose to see if this helps with some of his symptoms.  We will ask him to try to reduce his salt intake in order to help with his lower extremity swelling.  Also discussed him wearing compression stockings more frequently.  He states that he will get his labs done with his PCP soon, and have them sent to [place].  We will have him talk to a nutritionist to see if he can increase his protein intake and reduce his salt intake.  He is still taking his PPI twice daily and will get a follow-up EGD for monitoring of his previously noted esophagitis. - Will get updated labs with PCP and have them sent to [place] - Nutrition referral, aim for 1-1.5 g/kg/day protein intkae and reducing salt intake (ideally would like to get him to less than 2 g/day but this may be challenging for him based upon his current diet) - Lactulose, start off with 30 mL per day.  Will let me know how many bowel movements per day he is having - He will get his hep A and B vaccinations with his PCP - Cont PPI BID - EGD already scheduled for later this month - Will need repeat abdominal imaging for [place] screening in 02/2022 - RTC in 3 months  [name], MD  I spent 44 minutes of time, including in depth chart review, independent review of results as outlined above, communicating results with the patient directly, face-to-face time with the patient, coordinating care, ordering studies and  medications as appropriate, and documentation.

## 2021-10-28 ENCOUNTER — Other Ambulatory Visit (HOSPITAL_BASED_OUTPATIENT_CLINIC_OR_DEPARTMENT_OTHER): Payer: Self-pay

## 2021-10-30 ENCOUNTER — Other Ambulatory Visit: Payer: Self-pay | Admitting: Family Medicine

## 2021-10-30 DIAGNOSIS — K766 Portal hypertension: Secondary | ICD-10-CM

## 2021-10-30 DIAGNOSIS — K7030 Alcoholic cirrhosis of liver without ascites: Secondary | ICD-10-CM

## 2021-10-30 DIAGNOSIS — D696 Thrombocytopenia, unspecified: Secondary | ICD-10-CM

## 2021-11-05 DIAGNOSIS — R399 Unspecified symptoms and signs involving the genitourinary system: Secondary | ICD-10-CM | POA: Diagnosis not present

## 2021-11-05 DIAGNOSIS — D696 Thrombocytopenia, unspecified: Secondary | ICD-10-CM | POA: Diagnosis not present

## 2021-11-05 DIAGNOSIS — K7030 Alcoholic cirrhosis of liver without ascites: Secondary | ICD-10-CM | POA: Diagnosis not present

## 2021-11-05 DIAGNOSIS — K766 Portal hypertension: Secondary | ICD-10-CM | POA: Diagnosis not present

## 2021-11-06 ENCOUNTER — Other Ambulatory Visit: Payer: Self-pay | Admitting: Family Medicine

## 2021-11-06 DIAGNOSIS — D696 Thrombocytopenia, unspecified: Secondary | ICD-10-CM

## 2021-11-06 DIAGNOSIS — E876 Hypokalemia: Secondary | ICD-10-CM

## 2021-11-06 LAB — HEPATIC FUNCTION PANEL
AG Ratio: 1 (calc) (ref 1.0–2.5)
ALT: 15 U/L (ref 9–46)
AST: 75 U/L — ABNORMAL HIGH (ref 10–35)
Albumin: 3.3 g/dL — ABNORMAL LOW (ref 3.6–5.1)
Alkaline phosphatase (APISO): 68 U/L (ref 35–144)
Bilirubin, Direct: 0.5 mg/dL — ABNORMAL HIGH (ref 0.0–0.2)
Globulin: 3.2 g/dL (calc) (ref 1.9–3.7)
Indirect Bilirubin: 1 mg/dL (calc) (ref 0.2–1.2)
Total Bilirubin: 1.5 mg/dL — ABNORMAL HIGH (ref 0.2–1.2)
Total Protein: 6.5 g/dL (ref 6.1–8.1)

## 2021-11-06 LAB — CBC WITH DIFFERENTIAL/PLATELET
Absolute Monocytes: 294 cells/uL (ref 200–950)
Basophils Absolute: 20 cells/uL (ref 0–200)
Basophils Relative: 0.7 %
Eosinophils Absolute: 120 cells/uL (ref 15–500)
Eosinophils Relative: 4.3 %
HCT: 29.9 % — ABNORMAL LOW (ref 38.5–50.0)
Hemoglobin: 10 g/dL — ABNORMAL LOW (ref 13.2–17.1)
Lymphs Abs: 1669 cells/uL (ref 850–3900)
MCH: 31.9 pg (ref 27.0–33.0)
MCHC: 33.4 g/dL (ref 32.0–36.0)
MCV: 95.5 fL (ref 80.0–100.0)
MPV: 10.5 fL (ref 7.5–12.5)
Monocytes Relative: 10.5 %
Neutro Abs: 697 cells/uL — ABNORMAL LOW (ref 1500–7800)
Neutrophils Relative %: 24.9 %
Platelets: 17 10*3/uL — CL (ref 140–400)
RBC: 3.13 10*6/uL — ABNORMAL LOW (ref 4.20–5.80)
RDW: 14.4 % (ref 11.0–15.0)
Total Lymphocyte: 59.6 %
WBC: 2.8 10*3/uL — ABNORMAL LOW (ref 3.8–10.8)

## 2021-11-06 LAB — BASIC METABOLIC PANEL
BUN/Creatinine Ratio: 8 (calc) (ref 6–22)
BUN: 6 mg/dL — ABNORMAL LOW (ref 7–25)
CO2: 24 mmol/L (ref 20–32)
Calcium: 7.8 mg/dL — ABNORMAL LOW (ref 8.6–10.3)
Chloride: 104 mmol/L (ref 98–110)
Creat: 0.71 mg/dL (ref 0.70–1.30)
Glucose, Bld: 100 mg/dL — ABNORMAL HIGH (ref 65–99)
Potassium: 3 mmol/L — ABNORMAL LOW (ref 3.5–5.3)
Sodium: 141 mmol/L (ref 135–146)

## 2021-11-06 LAB — AMMONIA: Ammonia: 84 umol/L — ABNORMAL HIGH (ref <=72)

## 2021-11-06 LAB — MAGNESIUM: Magnesium: 1.1 mg/dL — ABNORMAL LOW (ref 1.5–2.5)

## 2021-11-06 LAB — PROTIME-INR
INR: 1.4 — ABNORMAL HIGH
Prothrombin Time: 13.8 s — ABNORMAL HIGH (ref 9.0–11.5)

## 2021-11-06 MED ORDER — POTASSIUM CHLORIDE CRYS ER 20 MEQ PO TBCR: 20.0000 meq | EXTENDED_RELEASE_TABLET | Freq: Two times a day (BID) | ORAL | 1 refills | Status: DC

## 2021-11-06 MED ORDER — MAGNESIUM CHLORIDE 64 MG PO TABS: 2.0000 | ORAL_TABLET | Freq: Every day | ORAL | 1 refills | Status: DC

## 2021-11-09 ENCOUNTER — Other Ambulatory Visit: Payer: Self-pay | Admitting: Family Medicine

## 2021-11-09 ENCOUNTER — Other Ambulatory Visit (HOSPITAL_BASED_OUTPATIENT_CLINIC_OR_DEPARTMENT_OTHER): Payer: Self-pay

## 2021-11-09 DIAGNOSIS — R569 Unspecified convulsions: Secondary | ICD-10-CM

## 2021-11-09 NOTE — Progress Notes (Signed)
Patient's wife stopped me today in clinic reports that Juan [name] had seizure like activity last night.  Was not responsive and post ictal appearing afterwards.  Has neurology for movement disorders but will refer to general neuro for EEG>  ?

## 2021-11-17 DIAGNOSIS — E876 Hypokalemia: Secondary | ICD-10-CM | POA: Diagnosis not present

## 2021-11-17 DIAGNOSIS — D696 Thrombocytopenia, unspecified: Secondary | ICD-10-CM | POA: Diagnosis not present

## 2021-11-17 DIAGNOSIS — R350 Frequency of micturition: Secondary | ICD-10-CM | POA: Diagnosis not present

## 2021-11-17 DIAGNOSIS — R351 Nocturia: Secondary | ICD-10-CM | POA: Diagnosis not present

## 2021-11-18 ENCOUNTER — Other Ambulatory Visit: Payer: Self-pay | Admitting: Family Medicine

## 2021-11-18 ENCOUNTER — Encounter: Payer: Self-pay | Admitting: Internal Medicine

## 2021-11-18 DIAGNOSIS — D696 Thrombocytopenia, unspecified: Secondary | ICD-10-CM

## 2021-11-18 DIAGNOSIS — E876 Hypokalemia: Secondary | ICD-10-CM

## 2021-11-18 LAB — CBC WITH DIFFERENTIAL/PLATELET
Absolute Monocytes: 680 cells/uL (ref 200–950)
Basophils Absolute: 72 cells/uL (ref 0–200)
Basophils Relative: 1.6 %
Eosinophils Absolute: 212 cells/uL (ref 15–500)
Eosinophils Relative: 4.7 %
HCT: 33.3 % — ABNORMAL LOW (ref 38.5–50.0)
Hemoglobin: 11.4 g/dL — ABNORMAL LOW (ref 13.2–17.1)
Lymphs Abs: 2219 cells/uL (ref 850–3900)
MCH: 34.2 pg — ABNORMAL HIGH (ref 27.0–33.0)
MCHC: 34.2 g/dL (ref 32.0–36.0)
MCV: 100 fL (ref 80.0–100.0)
MPV: 9.3 fL (ref 7.5–12.5)
Monocytes Relative: 15.1 %
Neutro Abs: 1319 cells/uL — ABNORMAL LOW (ref 1500–7800)
Neutrophils Relative %: 29.3 %
Platelets: 61 10*3/uL — ABNORMAL LOW (ref 140–400)
RBC: 3.33 10*6/uL — ABNORMAL LOW (ref 4.20–5.80)
RDW: 15 % (ref 11.0–15.0)
Total Lymphocyte: 49.3 %
WBC: 4.5 10*3/uL (ref 3.8–10.8)

## 2021-11-18 LAB — BASIC METABOLIC PANEL
BUN: 8 mg/dL (ref 7–25)
CO2: 27 mmol/L (ref 20–32)
Calcium: 8.6 mg/dL (ref 8.6–10.3)
Chloride: 105 mmol/L (ref 98–110)
Creat: 0.73 mg/dL (ref 0.70–1.30)
Glucose, Bld: 90 mg/dL (ref 65–99)
Potassium: 3.2 mmol/L — ABNORMAL LOW (ref 3.5–5.3)
Sodium: 143 mmol/L (ref 135–146)

## 2021-11-18 LAB — MAGNESIUM: Magnesium: 1.3 mg/dL — ABNORMAL LOW (ref 1.5–2.5)

## 2021-11-18 MED ORDER — POTASSIUM CHLORIDE CRYS ER 20 MEQ PO TBCR: 40.0000 meq | EXTENDED_RELEASE_TABLET | Freq: Two times a day (BID) | ORAL | 1 refills | Status: DC

## 2021-11-18 MED ORDER — MAGNESIUM CHLORIDE 64 MG PO TABS: 2.0000 | ORAL_TABLET | Freq: Two times a day (BID) | ORAL | 1 refills | Status: DC

## 2021-11-20 ENCOUNTER — Encounter: Payer: Self-pay | Admitting: Internal Medicine

## 2021-11-20 ENCOUNTER — Ambulatory Visit (AMBULATORY_SURGERY_CENTER): Payer: 59 | Admitting: Internal Medicine

## 2021-11-20 ENCOUNTER — Other Ambulatory Visit (HOSPITAL_BASED_OUTPATIENT_CLINIC_OR_DEPARTMENT_OTHER): Payer: Self-pay

## 2021-11-20 VITALS — BP 136/86 | HR 87 | Temp 98.8°F | Resp 17 | Ht 64.0 in | Wt 163.0 lb

## 2021-11-20 DIAGNOSIS — K766 Portal hypertension: Secondary | ICD-10-CM

## 2021-11-20 DIAGNOSIS — K319 Disease of stomach and duodenum, unspecified: Secondary | ICD-10-CM

## 2021-11-20 DIAGNOSIS — K208 Other esophagitis without bleeding: Secondary | ICD-10-CM

## 2021-11-20 DIAGNOSIS — K7030 Alcoholic cirrhosis of liver without ascites: Secondary | ICD-10-CM

## 2021-11-20 DIAGNOSIS — K7460 Unspecified cirrhosis of liver: Secondary | ICD-10-CM | POA: Diagnosis not present

## 2021-11-20 DIAGNOSIS — K449 Diaphragmatic hernia without obstruction or gangrene: Secondary | ICD-10-CM | POA: Diagnosis not present

## 2021-11-20 MED ORDER — PANTOPRAZOLE SODIUM 40 MG PO TBEC
40.0000 mg | DELAYED_RELEASE_TABLET | Freq: Every day | ORAL | 1 refills | Status: DC
  Filled 2021-11-20 – 2022-01-22 (×3): qty 60, 60d supply, fill #0
  Filled 2022-04-16: qty 60, 60d supply, fill #1

## 2021-11-20 MED ORDER — SODIUM CHLORIDE 0.9 % IV SOLN: 500.0000 mL | Freq: Once | INTRAVENOUS | Status: DC

## 2021-11-20 NOTE — Progress Notes (Signed)
VS by Perry  ? ?Pt's states no medical or surgical changes since previsit or office visit. ? ?

## 2021-11-20 NOTE — Progress Notes (Signed)
? ?GASTROENTEROLOGY PROCEDURE H&P NOTE  ? ?Primary Care Physician: ?[name], DO ? ? ? ?Reason for Procedure:   Follow up of esophagitis ? ?Plan:    EGD ? ?Patient is appropriate for endoscopic procedure(s) in the ambulatory ([facility]) setting. ? ?The nature of the procedure, as well as the risks, benefits, and alternatives were carefully and thoroughly reviewed with the patient. Ample time for discussion and questions allowed. The patient understood, was satisfied, and agreed to proceed.  ? ? ? ?HPI: ?Juan [name] is a 56 y.o. male who presents for EGD for evaluation of esophagitis .  Patient was most recently seen in the Gastroenterology Clinic on 10/27/21.  No interval change in medical history since that appointment. Please refer to that note for full details regarding GI history and clinical presentation.  ? ?Past Medical History:  ?Diagnosis Date  ? Alcohol addiction ([place])   ? Anxiety   ? Chronic fatigue   ? Cirrhosis ([place])   ? Colon polyps   ? Depression   ? GERD (gastroesophageal reflux disease)   ? Hemochromatosis   ? Hx of blood clots   ? Leg  ? Hyperreflexia   ? Hypertension   ? PTSD (post-traumatic stress disorder)   ? Traumatic hemorrhagic shock ([place])   ? ? ?Past Surgical History:  ?Procedure Laterality Date  ? BIOPSY  09/24/2021  ? Procedure: BIOPSY;  Surgeon: Sharyn Creamer, MD;  Location: [facility] ENDOSCOPY;  Service: Gastroenterology;;  ? CARPAL TUNNEL RELEASE Bilateral   ? COLONOSCOPY WITH PROPOFOL N/A 09/24/2021  ? Procedure: COLONOSCOPY WITH PROPOFOL;  Surgeon: Sharyn Creamer, MD;  Location: [place];  Service: Gastroenterology;  Laterality: N/A;  ? ESOPHAGOGASTRODUODENOSCOPY (EGD) WITH PROPOFOL N/A 09/24/2021  ? Procedure: ESOPHAGOGASTRODUODENOSCOPY (EGD) WITH PROPOFOL;  Surgeon: Sharyn Creamer, MD;  Location: [place];  Service: Gastroenterology;  Laterality: N/A;  ? FRACTURE SURGERY    ? left ankle plate  ? HERNIA REPAIR    ? inguinal  ? KNEE SURGERY Right   ? x 4  ? SHOULDER SURGERY Bilateral    ? x 2  ? VASECTOMY    ? ? ?Prior to Admission medications   ?Medication Sig Start Date End Date Taking? Authorizing Provider  ?aspirin 81 MG chewable tablet Chew 81 mg by mouth every morning.   Yes [provider]  ?B Complex Vitamins (B COMPLEX PO) Take 1 tablet by mouth every morning.   Yes [provider]  ?busPIRone (BUSPAR) 15 MG tablet Take 15 mg by mouth See admin instructions. Take one tablet (15 mg) by mouth twice daily - 9am and 6pm 08/27/21  Yes [provider]  ?Cholecalciferol 50 MCG (2000 UT) CAPS Take 2,000 Units by mouth every morning.   Yes [provider]  ?folic acid (FOLVITE) 1 MG tablet TAKE 1 TABLET (1 MG TOTAL) BY MOUTH DAILY. ?Patient taking differently: Take 1 mg by mouth every morning. 06/22/21 06/22/22 Yes Silverio Decamp, MD  ?Magnesium Chloride 64 MG TABS Take 2 tablets by mouth in the morning and at bedtime. 11/18/21  Yes [name], DO  ?Multiple Vitamins-Minerals (YOUR LIFE MULTI ADULT GUMMIES) CHEW Chew 1 tablet by mouth daily.   Yes [provider]  ?ondansetron (ZOFRAN-ODT) 8 MG disintegrating tablet Dissolve 1 tablet (8 mg total) by mouth every 8 (eight) hours as needed for nausea. 07/14/21  Yes Terrilyn Saver, NP  ?pantoprazole (PROTONIX) 40 MG tablet Take 1 tablet (40 mg total) by mouth 2 (two) times daily before a  meal. 09/24/21 09/24/22 Yes Kathie Dike, MD  ?potassium chloride SA (KLOR-CON M) 20 MEQ tablet Take 2 tablets (40 mEq total) by mouth 2 (two) times daily. 11/18/21 12/18/21 Yes [name], DO  ?prazosin (MINIPRESS) 2 MG capsule Take 4 mg by mouth at bedtime. 05/28/21  Yes [provider]  ?saccharomyces boulardii (FLORASTOR) 250 MG capsule Take 250 mg by mouth 2 (two) times daily.    Yes [provider]  ?thiamine 100 MG tablet Take 100 mg by mouth daily. 11/14/19  Yes [provider]  ?vortioxetine HBr (TRINTELLIX) 10 MG TABS tablet Take 1 tablet (10 mg total) by mouth daily. 09/02/21   Yes [name], DO  ?EPINEPHrine 0.3 mg/0.3 mL IJ SOAJ injection INJECT 0.3 MG INTO THE MUSCLE AS NEEDED FOR ANAPHYLAXIS. ?Patient taking differently: Inject 0.3 mg into the muscle once as needed for anaphylaxis (severe allergic reaction). 04/08/21 04/08/22  Hali Marry, MD  ?lactulose (CHRONULAC) 10 GM/15ML solution Take 30 mLs (20 g total) by mouth daily. ?Patient not taking: Reported on 11/20/2021 10/27/21 12/10/21  Sharyn Creamer, MD  ? ? ?Current Outpatient Medications  ?Medication Sig Dispense Refill  ? aspirin 81 MG chewable tablet Chew 81 mg by mouth every morning.    ? B Complex Vitamins (B COMPLEX PO) Take 1 tablet by mouth every morning.    ? busPIRone (BUSPAR) 15 MG tablet Take 15 mg by mouth See admin instructions. Take one tablet (15 mg) by mouth twice daily - 9am and 6pm    ? Cholecalciferol 50 MCG (2000 UT) CAPS Take 2,000 Units by mouth every morning.    ? folic acid (FOLVITE) 1 MG tablet TAKE 1 TABLET (1 MG TOTAL) BY MOUTH DAILY. (Patient taking differently: Take 1 mg by mouth every morning.) 90 tablet 3  ? Magnesium Chloride 64 MG TABS Take 2 tablets by mouth in the morning and at bedtime. 120 tablet 1  ? Multiple Vitamins-Minerals (YOUR LIFE MULTI ADULT GUMMIES) CHEW Chew 1 tablet by mouth daily.    ? ondansetron (ZOFRAN-ODT) 8 MG disintegrating tablet Dissolve 1 tablet (8 mg total) by mouth every 8 (eight) hours as needed for nausea. 20 tablet 1  ? pantoprazole (PROTONIX) 40 MG tablet Take 1 tablet (40 mg total) by mouth 2 (two) times daily before a meal. 60 tablet 1  ? potassium chloride SA (KLOR-CON M) 20 MEQ tablet Take 2 tablets (40 mEq total) by mouth 2 (two) times daily. 120 tablet 1  ? prazosin (MINIPRESS) 2 MG capsule Take 4 mg by mouth at bedtime.    ? saccharomyces boulardii (FLORASTOR) 250 MG capsule Take 250 mg by mouth 2 (two) times daily.     ? thiamine 100 MG tablet Take 100 mg by mouth daily.    ? vortioxetine HBr (TRINTELLIX) 10 MG TABS tablet Take 1 tablet (10 mg total)  by mouth daily. 30 tablet 3  ? EPINEPHrine 0.3 mg/0.3 mL IJ SOAJ injection INJECT 0.3 MG INTO THE MUSCLE AS NEEDED FOR ANAPHYLAXIS. (Patient taking differently: Inject 0.3 mg into the muscle once as needed for anaphylaxis (severe allergic reaction).) 2 each 0  ? lactulose (CHRONULAC) 10 GM/15ML solution Take 30 mLs (20 g total) by mouth daily. (Patient not taking: Reported on 11/20/2021) 900 mL 5  ? ?Current Facility-Administered Medications  ?Medication Dose Route Frequency Provider Last Rate Last Admin  ? 0.9 %  sodium chloride infusion  500 mL Intravenous Once Sharyn Creamer, MD      ? ? ?Allergies  as of 11/20/2021 - Review Complete 11/20/2021  ?Allergen Reaction Noted  ? Charentais melon (french melon) Anaphylaxis 03/07/2018  ? Cucumber extract Itching and Nausea And Vomiting 10/16/2013  ? Peanut butter flavor Anaphylaxis and Swelling 10/16/2013  ? Peanut oil Swelling 10/16/2013  ? Shellfish allergy Itching and Swelling 10/16/2013  ? Cantaloupe extract allergy skin test Rash 12/30/2017  ? Lactose Other (See Comments) 12/30/2017  ? Strawberry extract Nausea And Vomiting and Swelling 10/16/2013  ? Vancomycin Rash 12/29/2018  ? Apple juice Swelling 05/07/2021  ? Codeine Itching 11/10/2017  ? Depakote er [divalproex sodium er]  11/10/2017  ? ? ?Family History  ?Problem Relation Age of Onset  ? Pulmonary fibrosis Mother   ? Other Father   ?     liver failure  ? Breast cancer Paternal Aunt   ? Colon cancer Neg Hx   ? Esophageal cancer Neg Hx   ? Stomach cancer Neg Hx   ? Rectal cancer Neg Hx   ? ? ?Social History  ? ?Socioeconomic History  ? Marital status: Married  ?  Spouse name: [name]  ? Number of children: 2  ? Years of education: Not on file  ? Highest education level: High school graduate  ?Occupational History  ?  Comment: disability  ?Tobacco Use  ? Smoking status: Never  ? Smokeless tobacco: Current  ?  Types: Snuff  ?Vaping Use  ? Vaping Use: Never used  ?Substance and Sexual Activity  ? Alcohol use: Not  Currently  ? Drug use: Never  ? Sexual activity: Not on file  ?Other Topics Concern  ? Not on file  ?Social History Narrative  ? Lives with wife  ? ?Social Determinants of Health  ? ?Financial Resourc

## 2021-11-20 NOTE — Progress Notes (Signed)
Sedate, gd SR, tolerated procedure well, VSS, report to RN 

## 2021-11-20 NOTE — Patient Instructions (Signed)
Okay to decrease your pantoprazole from 65m twice a day to once a day. Repeat upper endoscopy in 3 years for variceal screening. ? ?[facility]   Refer to the procedure report that was given to you for any specific questions about what was found during the examination.  If the procedure report does not answer your questions, please call your gastroenterologist to clarify.  If you requested that your care partner not be given the details of your procedure findings, then the procedure report has been included in a sealed envelope for you to review at your convenience later. ? ?YOU SHOULD EXPECT: Some feelings of bloating in the abdomen. Passage of more gas than usual.  Walking can help get rid of the air that was put into your GI tract during the procedure and reduce the bloating. If you had a lower endoscopy (such as a colonoscopy or flexible sigmoidoscopy) you may notice spotting of blood in your stool or on the toilet paper. If you underwent a bowel prep for your procedure, you may not have a normal bowel movement for a few days. ? ?Please Note:  You might notice some irritation and congestion in your nose or some drainage.  This is from the oxygen used during your procedure.  There is no need for concern and it should clear up in a day or so. ? ?SYMPTOMS TO REPORT IMMEDIATELY: ? ?Following upper endoscopy (EGD) ? Vomiting of blood or coffee ground material ? New chest pain or pain under the shoulder blades ? Painful or persistently difficult swallowing ? New shortness of breath ? Fever of 100?F or higher ? Black, tarry-looking stools ? ?For urgent or emergent issues, a gastroenterologist can be reached at any hour by calling ([phone] ?Do not use MyChart messaging for urgent concerns.  ? ? ?DIET:  We do recommend a small meal at first, but then you may proceed to your regular diet.  Drink plenty of fluids but you should avoid alcoholic beverages for  24 hours. ? ?ACTIVITY:  You should plan to take it easy for the rest of today and you should NOT DRIVE or use heavy machinery until tomorrow (because of the sedation medicines used during the test).   ? ?FOLLOW UP: ?Our staff will call the number listed on your records 48-72 hours following your procedure to check on you and address any questions or concerns that you may have regarding the information given to you following your procedure. If we do not reach you, we will leave a message.  We will attempt to reach you two times.  During this call, we will ask if you have developed any symptoms of COVID 19. If you develop any symptoms (ie: fever, flu-like symptoms, shortness of breath, cough etc.) before then, please call [phone]  If you test positive for Covid 19 in the 2 weeks post procedure, please call and report this information to [facility]   ? ?If any biopsies were taken you will be contacted by phone or by letter within the next 1-3 weeks.  Please call [place] ([phone] you have not heard about the biopsies in 3 weeks.  ? ? ?SIGNATURES/CONFIDENTIALITY: ?You and/or your care partner have signed paperwork which will be entered into your electronic medical record.  These signatures attest to the fact that that the information above on your After Visit Summary has been reviewed and is understood.  Full responsibility of the confidentiality of  this discharge information lies with you and/or your care-partner.  ?

## 2021-11-20 NOTE — Op Note (Addendum)
Juan [name]: Juan [name] ?Procedure Date: 11/20/2021 3:14 PM ?MRN: [id number] ?Endoscopist: Juan [name] "Juan [name] ,  ?Age: 56 ?Referring MD:  ?Date of Birth: Dec 30, 1965 ?Gender: Male ?Account #: [id number] ?Procedure:                Upper GI endoscopy ?Indications:              Follow-up of esophagitis ?Medicines:                Monitored Anesthesia Care ?Procedure:                Pre-Anesthesia Assessment: ?                          - Prior to the procedure, a History and Physical  ?                          was performed, and patient medications and  ?                          allergies were reviewed. The patient's tolerance of  ?                          previous anesthesia was also reviewed. The risks  ?                          and benefits of the procedure and the sedation  ?                          options and risks were discussed with the patient.  ?                          All questions were answered, and informed consent  ?                          was obtained. Prior Anticoagulants: The patient has  ?                          taken no previous anticoagulant or antiplatelet  ?                          agents. ASA Grade Assessment: III - A patient with  ?                          severe systemic disease. After reviewing the risks  ?                          and benefits, the patient was deemed in  ?                          satisfactory condition to undergo the procedure. ?                          After obtaining informed consent, the endoscope was  ?  passed under direct vision. Throughout the  ?                          procedure, the patient's blood pressure, pulse, and  ?                          oxygen saturations were monitored continuously. The  ?                          Endoscope was introduced through the mouth, and  ?                          advanced to the second part of duodenum. The upper  ?                          GI endoscopy was  accomplished without difficulty.  ?                          The patient tolerated the procedure well. ?Scope In: ?Scope Out: ?Findings:                 The examined esophagus was normal. ?                          A medium-sized hiatal hernia was present. ?                          Severe, diffuse portal hypertensive gastropathy was  ?                          found in the gastric body. ?                          The examined duodenum was normal. ?Complications:            No immediate complications. ?Estimated Blood Loss:     Estimated blood loss was minimal. ?Impression:               - Normal esophagus. ?                          - Medium-sized hiatal hernia. ?                          - Portal hypertensive gastropathy. ?                          - Normal examined duodenum. ?                          - No specimens collected. ?Recommendation:           - Discharge patient to home (with escort). ?                          - Your previously noted esophagitis has healed. ?                          -  Okay to decrease your pantoprazole from 40 mg BID  ?                          to QD. ?                          - Repeat upper endoscopy in 3 years for variceal  ?                          screening. ?                          - Return to GI office in 1 month. ?                          - The findings and recommendations were discussed  ?                          with the patient. ?Juan [name],  ?11/20/2021 3:39:18 PM ?

## 2021-11-24 ENCOUNTER — Telehealth: Payer: Self-pay | Admitting: *Deleted

## 2021-11-24 NOTE — Telephone Encounter (Signed)
No answer for post procedure. Left VM. ?

## 2021-11-24 NOTE — Telephone Encounter (Signed)
?  Follow up Call- ? ? ?  11/20/2021  ?  2:49 PM  ?Call back number  ?Post procedure Call Back phone  # [phone]  ?Permission to leave phone message Yes  ?  ? ?Patient questions: ? ?Do you have a fever, pain , or abdominal swelling? No. ?Pain Score  0 * ? ?Have you tolerated food without any problems? Yes.   ? ?Have you been able to return to your normal activities? Yes.   ? ?Do you have any questions about your discharge instructions: ?Diet   No. ?Medications  No. ?Follow up visit  No. ? ?Do you have questions or concerns about your Care? No. ? ?Actions: ?* If pain score is 4 or above: ?No action needed, pain <4. ? ? ? ? ?

## 2021-11-26 DIAGNOSIS — G4700 Insomnia, unspecified: Secondary | ICD-10-CM | POA: Diagnosis not present

## 2021-11-26 DIAGNOSIS — F411 Generalized anxiety disorder: Secondary | ICD-10-CM | POA: Diagnosis not present

## 2021-11-26 DIAGNOSIS — F1021 Alcohol dependence, in remission: Secondary | ICD-10-CM | POA: Diagnosis not present

## 2021-11-27 ENCOUNTER — Other Ambulatory Visit (HOSPITAL_BASED_OUTPATIENT_CLINIC_OR_DEPARTMENT_OTHER): Payer: Self-pay

## 2021-12-01 ENCOUNTER — Encounter: Payer: Self-pay | Admitting: Dietician

## 2021-12-01 ENCOUNTER — Encounter: Payer: 59 | Attending: Internal Medicine | Admitting: Dietician

## 2021-12-01 DIAGNOSIS — K209 Esophagitis, unspecified without bleeding: Secondary | ICD-10-CM | POA: Insufficient documentation

## 2021-12-01 DIAGNOSIS — K7030 Alcoholic cirrhosis of liver without ascites: Secondary | ICD-10-CM | POA: Diagnosis not present

## 2021-12-01 DIAGNOSIS — K709 Alcoholic liver disease, unspecified: Secondary | ICD-10-CM

## 2021-12-01 NOTE — Patient Instructions (Addendum)
Consider switching to [place] brand milk. 1% milk is fine. ? ?Try adding in fat-free greek yogurt during the day for a high protein snack. Try Oikos Triple Zero  ? ?Set an alarm to eat every 2-3 hours. If your appetite is low, try a few small bites. Sip on a Premier Protein shake throughout the day. ? ?Browse your online grocery shopping site and look for lower sodium frozen meals. Try to keep them below 500 mg per meal. ?Some brands to look for include Amy's, Healthy Choice, Smart Ones, [place]. ? ?Space your fluid intake to in between your meals to allow the maximum amount of space you have for your food! ?

## 2021-12-01 NOTE — Progress Notes (Signed)
Medical Nutrition Therapy  ?Appointment Start time:  6579  Appointment End time:  0383 ? ?Primary concerns today: Cirrhosis Nutrition  ?Referral diagnosis: F38.32 - Alcoholic cirrhosis, N19.16 - Acute esophagitis ?Preferred learning style: No preference indicated ?Learning readiness: Ready ? ? ?NUTRITION ASSESSMENT  ? ?Anthropometrics  ?Ht: 5'5" ?Wt: 157.7 lbs ?UBW: 165 lbs ?Body mass index is 26.24 kg/m?. ? ? ?Clinical ?Medical Hx: Chronic Alcoholic Liver Disease, Cirrhosis, Esophagitis, HTN, Portal HTN, Hypocalcemia, C. Diff ?Medications: Lactulose, Pantoprazole, Florastor ?Labs: AST - 75 (high), Total Bilirubin - 1.5 (high), Albumin - 3.3 (low), Magnesium - 1.3 (low), Potassium - 3.2 (low), Ammonia - 84 (high) ?Notable Signs/Symptoms: Muscular Weakness, Slight lower leg edema ? ?Lifestyle & Dietary Hx ?Pt daughter Jarrett Soho is present during visit. ?Pt reports being admitted to the hospital due to elevated ammonia levels that led to a fall and was found to have alcoholic cirrhosis of the liver. Pt reports losing about 15 pounds leading up to hospitalization. Pt reports going into rehab for alcohol about 3 years ago, relapsed shortly, but has been clean for some time now. ?Pt reports drinking Premier Protein for breakfast if they do. Pt reports rarely eating breakfast or lunch and picks at their dinner. Pt states their appetite has been very low for a long time. ?Pt reports multiple food allergies including peanuts, shellfish, cucumbers, strawberry, melon, apples. ?Pt reports buying a stationary bike and treadmill to begin exercising at home ? ? ?Estimated daily fluid intake: 64 oz ?Supplements: Daily MV, Magnesium, Potassium, Thiamin, Folic Acid, B complex, Vit D ?Sleep: Ok, has PTSD that requires medication ?Stress / self-care: Lower since hospital release ?Current average weekly physical activity: ADLs, currently using a walker ? ? ?24-Hr Dietary Recall ?First Meal: Bowl of raisin bran, lactaid milk ?Snack:  none ?Second Meal: Tuna ?Snack: none ?Third Meal: [place] Clap breakfast bowl ?Snack: none ?Beverages: Gatorade Zero, Water, Ginger ale ? ?Estimated Energy Needs ?Calories: 1875 - 2250 kcal ?Protein: 75 - 98g ? ? ?NUTRITION DIAGNOSIS  ?[place]-1.4 Altered GI function As related to alcoholic cirrhosis.  As evidenced by history of alcohol abuse, elevated LFTs, elevated serum ammonia, and dramatically decreased appetite. ? ? ?NUTRITION INTERVENTION  ?Nutrition education (E-1) on the following topics:  ?Educated pt on the physiology associated with liver function and the results of liver damage. Educated pt on the need for a high calorie, high protein diet for cirrhosis of the liver. Advised pt to eat every 2-3 hours to help keep consistent caloric intake. Recommended a reduced sodium diet to decrease fluid retention. ? ?Handouts Provided Include  ?Cirrhosis Nutrition Therapy Nutrition Care Manual ? ?Learning Style & Readiness for Change ?Teaching method utilized: Visual & Auditory  ?Demonstrated degree of understanding via: Teach Back  ?Barriers to learning/adherence to lifestyle change: None ? ?Goals Established by Pt ?Consider switching to [place] brand milk. 1% milk is fine. ?Try adding in fat-free greek yogurt during the day for a high protein snack. Try Oikos Triple Zero  ?Set an alarm to eat every 2-3 hours. If your appetite is low, try a few small bites. Sip on a Premier Protein shake throughout the day. ?Browse your online grocery shopping site and look for lower sodium frozen meals. Try to keep them below 500 mg per meal. ?Some brands to look for include Amy's, Healthy Choice, Smart Ones, [employer]. ?Space your fluid intake to in between your meals to allow the maximum amount of space you have for your food! ? ? ?MONITORING & EVALUATION ?Dietary  intake, weekly physical activity, and body weight in 6 weeks. ? ?Next Steps  ?Patient is to increase PO intake and follow up with RD. ? ?

## 2021-12-04 ENCOUNTER — Encounter: Payer: Self-pay | Admitting: Family Medicine

## 2021-12-04 ENCOUNTER — Ambulatory Visit (INDEPENDENT_AMBULATORY_CARE_PROVIDER_SITE_OTHER): Payer: 59 | Admitting: Family Medicine

## 2021-12-04 DIAGNOSIS — I10 Essential (primary) hypertension: Secondary | ICD-10-CM | POA: Diagnosis not present

## 2021-12-04 DIAGNOSIS — F4312 Post-traumatic stress disorder, chronic: Secondary | ICD-10-CM | POA: Diagnosis not present

## 2021-12-04 DIAGNOSIS — G729 Myopathy, unspecified: Secondary | ICD-10-CM

## 2021-12-04 DIAGNOSIS — D696 Thrombocytopenia, unspecified: Secondary | ICD-10-CM

## 2021-12-06 NOTE — Assessment & Plan Note (Signed)
Likely related to chronic alcohol use.  He does see a neurology through [facility]. ?

## 2021-12-06 NOTE — Assessment & Plan Note (Signed)
Blood pressure stable at this time.  He is off all hypertensive medications at this time. ?

## 2021-12-06 NOTE — Assessment & Plan Note (Signed)
PTSD is managed by psychiatry through the [place].  Stable at this time. ?

## 2021-12-06 NOTE — Assessment & Plan Note (Signed)
Improved on recent labs.  We will plan to repeat again in 2 weeks. ?

## 2021-12-06 NOTE — Progress Notes (Signed)
?Juan [name] - 56 y.o. male MRN [id number]  Date of birth: 1966/02/12 ? ?Subjective ?Chief Complaint  ?Patient presents with  ? Anxiety  ? ? ?HPI ?Juan [name] is a 56 year old male here today for follow-up visit.  Continues to see hepatologist due to cirrhosis.  Recently had endoscopy for evaluation of varices.  Endoscopy did not show signs of varices.  He has started seeing a nutritionist as well who recommended that he start a higher protein diet.  He is supplementing with high-protein shakes and doing well with this. ? ?Continues to have difficulty with ambulation.  Typically uses a walker at home.  He is using a cane today.  Sometimes he does have to use his wheelchair.  He has tried physical therapy a couple of times but did not feel like he had significant improvement with this. ? ?Seeing psychiatry through the [facility] for management of PTSD depression and anxiety.  Stable at this time. ? 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  His wife has reported seizure d like activity on occasion.  Awaiting appointment for general neurology. ? ?ROS:  A comprehensive ROS was completed and negative except as noted per HPI ? ? ?Allergies  ?Allergen Reactions  ? Charentais Melon (French Melon) Anaphylaxis  ? Cucumber Extract Itching and Nausea And Vomiting  ?  No extracts; just cucumber ?  ? Peanut Butter Flavor Anaphylaxis and Swelling  ? Peanut Oil Swelling  ? Shellfish Allergy Itching and Swelling  ? Cantaloupe Extract Allergy Skin Test Rash  ? Lactose Other (See Comments)  ?  Lactose intolerant - causes indigestion  ? Strawberry Extract Nausea And Vomiting and Swelling  ? Vancomycin Rash  ? Apple Juice Swelling  ? Codeine Itching  ?  itching  ? Depakote Er [Divalproex Sodium Er]   ?  Tongue swelling  ? ? ?Past Medical History:  ?Diagnosis Date  ? Alcohol addiction ([place])   ? Anxiety   ? Chronic fatigue   ? Cirrhosis ([place])   ? Colon polyps   ? Depression   ? GERD (gastroesophageal reflux disease)    ? Hemochromatosis   ? Hx of blood clots   ? Leg  ? Hyperreflexia   ? Hypertension   ? PTSD (post-traumatic stress disorder)   ? Traumatic hemorrhagic shock ([place])   ? ? ?Past Surgical History:  ?Procedure Laterality Date  ? BIOPSY  09/24/2021  ? Procedure: BIOPSY;  Surgeon: Sharyn Creamer, MD;  Location: [facility] ENDOSCOPY;  Service: Gastroenterology;;  ? CARPAL TUNNEL RELEASE Bilateral   ? COLONOSCOPY WITH PROPOFOL N/A 09/24/2021  ? Procedure: COLONOSCOPY WITH PROPOFOL;  Surgeon: Sharyn Creamer, MD;  Location: [place];  Service: Gastroenterology;  Laterality: N/A;  ? ESOPHAGOGASTRODUODENOSCOPY (EGD) WITH PROPOFOL N/A 09/24/2021  ? Procedure: ESOPHAGOGASTRODUODENOSCOPY (EGD) WITH PROPOFOL;  Surgeon: Sharyn Creamer, MD;  Location: [place];  Service: Gastroenterology;  Laterality: N/A;  ? FRACTURE SURGERY    ? left ankle plate  ? HERNIA REPAIR    ? inguinal  ? KNEE SURGERY Right   ? x 4  ? SHOULDER SURGERY Bilateral   ? x 2  ? VASECTOMY    ? ? ?Social History  ? ?Socioeconomic History  ? Marital status: Married  ?  Spouse name: Juan [name]  ? Number of children: 2  ? Years of education: Not on file  ? Highest education level: High school graduate  ?Occupational History  ?  Comment: disability  ?Tobacco Use  ? Smoking status: Never  ?  Smokeless tobacco: Current  ?  Types: Snuff  ?Vaping Use  ? Vaping Use: Never used  ?Substance and Sexual Activity  ? Alcohol use: Not Currently  ? Drug use: Never  ? Sexual activity: Not on file  ?Other Topics Concern  ? Not on file  ?Social History Narrative  ? Lives with wife  ? ?Social Determinants of Health  ? ?Financial Resource Strain: Not on file  ?Food Insecurity: Not on file  ?Transportation Needs: Not on file  ?Physical Activity: Not on file  ?Stress: Not on file  ?Social Connections: Not on file  ? ? ?Family History  ?Problem Relation Age of Onset  ? Pulmonary fibrosis Mother   ? Other Father   ?     liver failure  ? Breast cancer Paternal Aunt   ? Colon cancer Neg Hx   ?  Esophageal cancer Neg Hx   ? Stomach cancer Neg Hx   ? Rectal cancer Neg Hx   ? ? ?Health Maintenance  ?Topic Date Due  ? COVID-19 Vaccine (4 - Booster for [place] series) 12/20/2021 (Originally 12/29/2020)  ? INFLUENZA VACCINE  03/23/2022  ? TETANUS/TDAP  06/20/2029  ? COLONOSCOPY (Pts 45-81yr Insurance coverage will need to be confirmed)  09/25/2031  ? Hepatitis C Screening  Completed  ? HIV Screening  Completed  ? Zoster Vaccines- Shingrix  Completed  ? HPV VACCINES  Aged Out  ? ? ? ?----------------------------------------------------------------------------------------------------------------------------------------------------------------------------------------------------------------- ?Physical Exam ?BP 131/74 (BP Location: Left Arm, Patient Position: Sitting, Cuff Size: Normal)   Pulse 87   Ht 5' 5"  (1.651 m)   Wt 160 lb (72.6 kg)   SpO2 98%   BMI 26.63 kg/m?  ? ?Physical Exam ?Constitutional:   ?   Appearance: Normal appearance.  ?Eyes:  ?   General: No scleral icterus. ?Cardiovascular:  ?   Rate and Rhythm: Normal rate and regular rhythm.  ?Pulmonary:  ?   Effort: Pulmonary effort is normal.  ?   Breath sounds: Normal breath sounds.  ?Musculoskeletal:  ?   Cervical back: Neck supple.  ?Neurological:  ?   Mental Status: He is alert.  ?Psychiatric:     ?   Mood and Affect: Mood normal.     ?   Behavior: Behavior normal.  ? ? ?------------------------------------------------------------------------------------------------------------------------------------------------------------------------------------------------------------------- ?Assessment and Plan ? ?Benign essential hypertension ?Blood pressure stable at this time.  He is off all hypertensive medications at this time. ? ?Thrombocytopenia ([place] ?Improved on recent labs.  We will plan to repeat again in 2 weeks. ? ?Chronic post-traumatic stress disorder (PTSD) ?[place] [facility] [place]  Stable at this time. ? 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  [facility] ? ? ?No orders of the defined types were placed in this encounter. ? ? ?Return in about 4 months (around 04/05/2022) for Follow up chronic conditions. ? ? ? ?This visit occurred during the SARS-CoV-2 public health emergency.  Safety protocols were in place, including screening questions prior to the visit, additional usage of staff PPE, and extensive cleaning of exam room while observing appropriate contact time as indicated for disinfecting solutions.  ? ?

## 2021-12-08 DIAGNOSIS — F1021 Alcohol dependence, in remission: Secondary | ICD-10-CM | POA: Diagnosis not present

## 2021-12-08 DIAGNOSIS — F411 Generalized anxiety disorder: Secondary | ICD-10-CM | POA: Diagnosis not present

## 2021-12-08 DIAGNOSIS — G4700 Insomnia, unspecified: Secondary | ICD-10-CM | POA: Diagnosis not present

## 2021-12-09 ENCOUNTER — Telehealth: Payer: Self-pay

## 2021-12-09 NOTE — Telephone Encounter (Addendum)
Initiated Prior authorization RAL:CMIM-JII M20 20MEQ er tablets ?Via: Covermymeds ?Case/Key:[id number] ?Status: approved  as of 12/09/21 ?Reason:This medication does not require a prior authorization because it is a covered benefit. ?Notified Pt via: Mychart ?

## 2021-12-22 ENCOUNTER — Other Ambulatory Visit (HOSPITAL_BASED_OUTPATIENT_CLINIC_OR_DEPARTMENT_OTHER): Payer: Self-pay

## 2021-12-22 ENCOUNTER — Encounter: Payer: Self-pay | Admitting: Sports Medicine

## 2021-12-22 ENCOUNTER — Ambulatory Visit (INDEPENDENT_AMBULATORY_CARE_PROVIDER_SITE_OTHER): Payer: 59

## 2021-12-22 ENCOUNTER — Ambulatory Visit (INDEPENDENT_AMBULATORY_CARE_PROVIDER_SITE_OTHER): Payer: Non-veteran care | Admitting: Sports Medicine

## 2021-12-22 DIAGNOSIS — R0781 Pleurodynia: Secondary | ICD-10-CM | POA: Diagnosis not present

## 2021-12-22 DIAGNOSIS — M542 Cervicalgia: Secondary | ICD-10-CM | POA: Diagnosis not present

## 2021-12-22 DIAGNOSIS — M25512 Pain in left shoulder: Secondary | ICD-10-CM

## 2021-12-22 DIAGNOSIS — W19XXXA Unspecified fall, initial encounter: Secondary | ICD-10-CM

## 2021-12-22 HISTORY — DX: Pain in left shoulder: M25.512

## 2021-12-22 MED ORDER — PREDNISONE 50 MG PO TABS
ORAL_TABLET | ORAL | 0 refills | Status: DC
  Filled 2021-12-22: qty 5, 5d supply, fill #0

## 2021-12-22 MED ORDER — CYCLOBENZAPRINE HCL 10 MG PO TABS
ORAL_TABLET | ORAL | 0 refills | Status: DC
  Filled 2021-12-22: qty 30, 10d supply, fill #0

## 2021-12-22 NOTE — Progress Notes (Signed)
? ? ?  Procedures performed today:   ? ?None. ? ?Independent interpretation of notes and tests performed by another provider:  ? ?None. ? ?Brief History, Exam, Impression, and Recommendations:   ? ?Shoulder pain, left, posterior ?Pleasant 56 year old male, fall 6 days ago, pain left periscapular. ?Suspect radicular from the cervical spine, we will get cervical spine x-rays, left chest wall imaging as he does have some tenderness over the left posterior ribs medial to the scapula. ?Adding 5 days of prednisone, home conditioning, Flexeril, return to see me in 4 to 6 weeks. ? ? ? ?___________________________________________ ?Gwen Her. Dianah Field, M.D., ABFM., CAQSM. ?Primary Care and Sports Medicine ?[place] ? ?Adjunct Instructor of Family Medicine  ?[employer] ?

## 2021-12-22 NOTE — Assessment & Plan Note (Signed)
Pleasant 56 year old male, fall 6 days ago, pain left periscapular. ?Suspect radicular from the cervical spine, we will get cervical spine x-rays, left chest wall imaging as he does have some tenderness over the left posterior ribs medial to the scapula. ?Adding 5 days of prednisone, home conditioning, Flexeril, return to see me in 4 to 6 weeks. ?

## 2021-12-23 LAB — BASIC METABOLIC PANEL
BUN: 7 mg/dL (ref 7–25)
CO2: 26 mmol/L (ref 20–32)
Calcium: 7.9 mg/dL — ABNORMAL LOW (ref 8.6–10.3)
Chloride: 104 mmol/L (ref 98–110)
Creat: 0.7 mg/dL (ref 0.70–1.30)
Glucose, Bld: 92 mg/dL (ref 65–99)
Potassium: 3.3 mmol/L — ABNORMAL LOW (ref 3.5–5.3)
Sodium: 142 mmol/L (ref 135–146)

## 2021-12-23 LAB — CBC WITH DIFFERENTIAL/PLATELET
Absolute Monocytes: 480 cells/uL (ref 200–950)
Basophils Absolute: 51 cells/uL (ref 0–200)
Basophils Relative: 1.6 %
Eosinophils Absolute: 141 cells/uL (ref 15–500)
Eosinophils Relative: 4.4 %
HCT: 31.8 % — ABNORMAL LOW (ref 38.5–50.0)
Hemoglobin: 10.8 g/dL — ABNORMAL LOW (ref 13.2–17.1)
Lymphs Abs: 1434 cells/uL (ref 850–3900)
MCH: 31.9 pg (ref 27.0–33.0)
MCHC: 34 g/dL (ref 32.0–36.0)
MCV: 93.8 fL (ref 80.0–100.0)
MPV: 9.9 fL (ref 7.5–12.5)
Monocytes Relative: 15 %
Neutro Abs: 1094 cells/uL — ABNORMAL LOW (ref 1500–7800)
Neutrophils Relative %: 34.2 %
Platelets: 30 10*3/uL — ABNORMAL LOW (ref 140–400)
RBC: 3.39 10*6/uL — ABNORMAL LOW (ref 4.20–5.80)
RDW: 16 % — ABNORMAL HIGH (ref 11.0–15.0)
Total Lymphocyte: 44.8 %
WBC: 3.2 10*3/uL — ABNORMAL LOW (ref 3.8–10.8)

## 2021-12-23 LAB — MAGNESIUM: Magnesium: 1.3 mg/dL — ABNORMAL LOW (ref 1.5–2.5)

## 2022-01-10 ENCOUNTER — Encounter (HOSPITAL_BASED_OUTPATIENT_CLINIC_OR_DEPARTMENT_OTHER): Payer: Self-pay | Admitting: Emergency Medicine

## 2022-01-10 ENCOUNTER — Emergency Department (HOSPITAL_BASED_OUTPATIENT_CLINIC_OR_DEPARTMENT_OTHER): Payer: 59

## 2022-01-10 ENCOUNTER — Observation Stay (HOSPITAL_BASED_OUTPATIENT_CLINIC_OR_DEPARTMENT_OTHER)
Admission: EM | Admit: 2022-01-10 | Discharge: 2022-01-12 | Disposition: A | Discharge status: Home Health | Payer: 59 | Attending: Internal Medicine | Admitting: Internal Medicine

## 2022-01-10 DIAGNOSIS — R079 Chest pain, unspecified: Secondary | ICD-10-CM | POA: Diagnosis not present

## 2022-01-10 DIAGNOSIS — I10 Essential (primary) hypertension: Secondary | ICD-10-CM | POA: Diagnosis not present

## 2022-01-10 DIAGNOSIS — E440 Moderate protein-calorie malnutrition: Secondary | ICD-10-CM | POA: Insufficient documentation

## 2022-01-10 DIAGNOSIS — Z79899 Other long term (current) drug therapy: Secondary | ICD-10-CM | POA: Diagnosis not present

## 2022-01-10 DIAGNOSIS — Z7982 Long term (current) use of aspirin: Secondary | ICD-10-CM | POA: Diagnosis not present

## 2022-01-10 DIAGNOSIS — I200 Unstable angina: Secondary | ICD-10-CM | POA: Insufficient documentation

## 2022-01-10 DIAGNOSIS — D696 Thrombocytopenia, unspecified: Secondary | ICD-10-CM | POA: Diagnosis present

## 2022-01-10 DIAGNOSIS — R0789 Other chest pain: Principal | ICD-10-CM | POA: Insufficient documentation

## 2022-01-10 DIAGNOSIS — G721 Alcoholic myopathy: Secondary | ICD-10-CM | POA: Diagnosis present

## 2022-01-10 DIAGNOSIS — E876 Hypokalemia: Secondary | ICD-10-CM | POA: Diagnosis not present

## 2022-01-10 DIAGNOSIS — K7030 Alcoholic cirrhosis of liver without ascites: Secondary | ICD-10-CM | POA: Insufficient documentation

## 2022-01-10 DIAGNOSIS — Z91010 Allergy to peanuts: Secondary | ICD-10-CM | POA: Diagnosis not present

## 2022-01-10 DIAGNOSIS — R0602 Shortness of breath: Secondary | ICD-10-CM | POA: Diagnosis not present

## 2022-01-10 DIAGNOSIS — F1010 Alcohol abuse, uncomplicated: Secondary | ICD-10-CM | POA: Diagnosis present

## 2022-01-10 DIAGNOSIS — K709 Alcoholic liver disease, unspecified: Secondary | ICD-10-CM | POA: Diagnosis present

## 2022-01-10 LAB — HEPATIC FUNCTION PANEL
ALT: 21 U/L (ref 0–44)
AST: 102 U/L — ABNORMAL HIGH (ref 15–41)
Albumin: 3.2 g/dL — ABNORMAL LOW (ref 3.5–5.0)
Alkaline Phosphatase: 83 U/L (ref 38–126)
Bilirubin, Direct: 0.9 mg/dL — ABNORMAL HIGH (ref 0.0–0.2)
Indirect Bilirubin: 2.3 mg/dL — ABNORMAL HIGH (ref 0.3–0.9)
Total Bilirubin: 3.2 mg/dL — ABNORMAL HIGH (ref 0.3–1.2)
Total Protein: 7.4 g/dL (ref 6.5–8.1)

## 2022-01-10 LAB — CBC
HCT: 29.3 % — ABNORMAL LOW (ref 39.0–52.0)
Hemoglobin: 10.1 g/dL — ABNORMAL LOW (ref 13.0–17.0)
MCH: 31.6 pg (ref 26.0–34.0)
MCHC: 34.5 g/dL (ref 30.0–36.0)
MCV: 91.6 fL (ref 80.0–100.0)
Platelets: 29 10*3/uL — CL (ref 150–400)
RBC: 3.2 MIL/uL — ABNORMAL LOW (ref 4.22–5.81)
RDW: 15.5 % (ref 11.5–15.5)
WBC: 4.1 10*3/uL (ref 4.0–10.5)
nRBC: 0 % (ref 0.0–0.2)

## 2022-01-10 LAB — TROPONIN I (HIGH SENSITIVITY)
Troponin I (High Sensitivity): 10 ng/L (ref <18)
Troponin I (High Sensitivity): 10 ng/L (ref <18)

## 2022-01-10 LAB — BASIC METABOLIC PANEL
Anion gap: 11 (ref 5–15)
BUN: 7 mg/dL (ref 6–20)
CO2: 22 mmol/L (ref 22–32)
Calcium: 9 mg/dL (ref 8.9–10.3)
Chloride: 101 mmol/L (ref 98–111)
Creatinine, Ser: 0.62 mg/dL (ref 0.61–1.24)
GFR, Estimated: >60 mL/min (ref >60)
Glucose, Bld: 88 mg/dL (ref 70–99)
Potassium: 3.2 mmol/L — ABNORMAL LOW (ref 3.5–5.1)
Sodium: 134 mmol/L — ABNORMAL LOW (ref 135–145)

## 2022-01-10 LAB — BRAIN NATRIURETIC PEPTIDE: B Natriuretic Peptide: 85.4 pg/mL (ref 0.0–100.0)

## 2022-01-10 MED ORDER — NITROGLYCERIN 0.4 MG SL SUBL
0.4000 mg | SUBLINGUAL_TABLET | SUBLINGUAL | Status: DC | PRN
  Administered 2022-01-10: 0.4 mg via SUBLINGUAL
  Filled 2022-01-10: qty 1

## 2022-01-10 MED ORDER — IOHEXOL 350 MG/ML SOLN
80.0000 mL | Freq: Once | INTRAVENOUS | Status: AC | PRN
Start: 2022-01-10 — End: 2022-01-10
  Administered 2022-01-10: 80 mL via INTRAVENOUS

## 2022-01-10 MED ORDER — ASPIRIN 325 MG PO TABS
325.0000 mg | ORAL_TABLET | Freq: Every day | ORAL | Status: DC
  Administered 2022-01-10 – 2022-01-12 (×2): 325 mg via ORAL
  Filled 2022-01-10 (×2): qty 1

## 2022-01-10 NOTE — ED Triage Notes (Signed)
Pt presents with central chest pain that started ~ 3 pm today. Pt took nitro when pain started, had some relief, but pain has not resolved. Denies radiation. Endorses mild shob, back pain, and nausea.

## 2022-01-10 NOTE — ED Notes (Addendum)
Report given to floor [facility] in to transport

## 2022-01-10 NOTE — ED Notes (Signed)
Report given to [facility] 

## 2022-01-10 NOTE — ED Provider Notes (Signed)
[facility] EMERGENCY DEPARTMENT Provider Note   CSN: [id number] Arrival date & time: 01/10/22  1706     History Chief Complaint  Patient presents with   Chest Pain    Juan [name] is a 56 y.o. male.  56 year old male with a past medical history of hypertension presents to the ED with a chief complaint of central sharp chest pain which began while he was trying to take a nap. Patient reports the pain as 10/10 severe in nature this was accompanied by nausea, diaphoresis.  Does report taking some nitroglycerin which helped bring the pain down to a 7 out of 10.  Does have a prior history of a murmur, recent echocardiogram to evaluate this.  Patient has a prior history of alcohol use with diagnoses of cirrhosis recently.  Reports no problems with his heart in the past, has had no cardiology follow up in the past. No exacerbating factors. He denies any leg swelling, palpitations, tobacco use. Prior hx of DVT post op. No prior hx of CAD, or family Hx of CAD.   The history is provided by the patient.  Chest Pain Pain location:  Substernal area Pain quality: sharp   Pain radiates to:  Does not radiate Pain severity:  Severe Onset quality:  Gradual Duration:  1 hour Timing:  Intermittent Progression:  Improving Chronicity:  New Context: at rest   Relieved by:  Nitroglycerin Worsened by:  Nothing Associated symptoms: diaphoresis, nausea and shortness of breath   Associated symptoms: no abdominal pain, no cough, no dizziness, no fever, no headache and no vomiting       Home Medications Prior to Admission medications   Medication Sig Start Date End Date Taking? Authorizing Provider  aspirin 81 MG chewable tablet Chew 81 mg by mouth every morning.   Yes [provider]  B Complex Vitamins (B COMPLEX PO) Take 1 tablet by mouth every morning.   Yes [provider]  busPIRone (BUSPAR) 15 MG tablet Take 15 mg by mouth See admin instructions. Take one tablet (15  mg) by mouth twice daily - 9am and 6pm 08/27/21  Yes [provider]  busPIRone (BUSPAR) 15 MG tablet TAKE ONE-HALF TABLET BY MOUTH TWICE A DAY FOR 7 DAYS, THEN TAKE ONE TABLET TWICE A DAY FOR ANXIETY AND IRRITABILITY 12/08/21  Yes [provider]  cyclobenzaprine (FLEXERIL) 10 MG tablet Take 1/2 to 1 tablet by mouth at bedtime, then increase gradually to 1 tablet 3 times a day 12/22/21  Yes Silverio Decamp, MD  EPINEPHrine 0.3 mg/0.3 mL IJ SOAJ injection INJECT 0.3 MG INTO THE MUSCLE AS NEEDED FOR ANAPHYLAXIS. Patient taking differently: Inject 0.3 mg into the muscle once as needed for anaphylaxis (severe allergic reaction). 04/08/21 04/08/22 Yes Hali Marry, MD  folic acid (FOLVITE) 1 MG tablet TAKE 1 TABLET (1 MG TOTAL) BY MOUTH DAILY. Patient taking differently: Take 1 mg by mouth every morning. 06/22/21 06/22/22 Yes Silverio Decamp, MD  Magnesium Chloride 64 MG TABS Take 2 tablets by mouth in the morning and at bedtime. 11/18/21  Yes [name], DO  Multiple Vitamins-Minerals (YOUR LIFE MULTI ADULT GUMMIES) CHEW Chew 1 tablet by mouth daily.   Yes [provider]  ondansetron (ZOFRAN-ODT) 8 MG disintegrating tablet Dissolve 1 tablet (8 mg total) by mouth every 8 (eight) hours as needed for nausea. 07/14/21  Yes Terrilyn Saver, NP  pantoprazole (PROTONIX) 40 MG tablet Take 1 tablet (40 mg total) by mouth daily. 11/20/21 11/20/22  Yes Sharyn Creamer, MD  prazosin (MINIPRESS) 2 MG capsule Take 4 mg by mouth at bedtime. 05/28/21  Yes [provider]  saccharomyces boulardii (FLORASTOR) 250 MG capsule Take 250 mg by mouth 2 (two) times daily.    Yes [provider]  thiamine 100 MG tablet Take 100 mg by mouth daily. 11/14/19  Yes [provider]  vortioxetine HBr (TRINTELLIX) 10 MG TABS tablet Take 1 tablet (10 mg total) by mouth daily. 09/02/21  Yes [name], DO  Cholecalciferol 50 MCG (2000 UT) CAPS Take 2,000 Units by mouth  every morning.    [provider]  potassium chloride SA (KLOR-CON M) 20 MEQ tablet Take 2 tablets (40 mEq total) by mouth 2 (two) times daily. 11/18/21 12/18/21  [name], DO  predniSONE (DELTASONE) 50 MG tablet Take 1 tablet by mouth daily for 5 days 12/22/21   Silverio Decamp, MD      Allergies    Charentais melon (french melon), Cucumber extract, Peanut butter flavor, Peanut oil, Shellfish allergy, Cantaloupe extract allergy skin test, Lactose, Strawberry extract, Vancomycin, Apple juice, Codeine, and Depakote er [divalproex sodium er]    Review of Systems   Review of Systems  Constitutional:  Positive for diaphoresis. Negative for fever.  HENT:  Negative for sore throat.   Respiratory:  Positive for shortness of breath. Negative for cough.   Cardiovascular:  Positive for chest pain. Negative for leg swelling.  Gastrointestinal:  Positive for nausea. Negative for abdominal pain and vomiting.  Genitourinary:  Negative for flank pain.  Neurological:  Negative for dizziness, facial asymmetry, light-headedness and headaches.  All other systems reviewed and are negative.  Physical Exam Updated Vital Signs BP (!) 153/86   Pulse 92   Temp 98.2 F (36.8 C) (Oral)   Resp (!) 24   Ht 5' 5"  (1.651 m)   Wt 73.9 kg   SpO2 97%   BMI 27.12 kg/m  Physical Exam Vitals and nursing note reviewed.  Constitutional:      Appearance: He is well-developed.  HENT:     Head: Normocephalic and atraumatic.  Cardiovascular:     Rate and Rhythm: Normal rate.     Pulses:          Radial pulses are 2+ on the right side and 2+ on the left side.     Heart sounds: Murmur heard.  Pulmonary:     Effort: Pulmonary effort is normal.     Breath sounds: No decreased breath sounds or wheezing.  Chest:     Chest wall: No tenderness.  Abdominal:     Palpations: Abdomen is soft.  Musculoskeletal:     Cervical back: Normal range of motion and neck supple.     Right lower leg: 1+ Edema  present.     Left lower leg: 2+ Edema present.  Skin:    General: Skin is warm and dry.  Neurological:     Mental Status: He is alert and oriented to person, place, and time.    ED Results / Procedures / Treatments   Labs (all labs ordered are listed, but only abnormal results are displayed) Labs Reviewed  BASIC METABOLIC PANEL - Abnormal; Notable for the following components:      Result Value   Sodium 134 (*)    Potassium 3.2 (*)    All other components within normal limits  HEPATIC FUNCTION PANEL - Abnormal; Notable for the following components:   Albumin 3.2 (*)    AST  102 (*)    Total Bilirubin 3.2 (*)    Bilirubin, Direct 0.9 (*)    Indirect Bilirubin 2.3 (*)    All other components within normal limits  CBC - Abnormal; Notable for the following components:   RBC 3.20 (*)    Hemoglobin 10.1 (*)    HCT 29.3 (*)    Platelets 29 (*)    All other components within normal limits  BRAIN NATRIURETIC PEPTIDE  TROPONIN I (HIGH SENSITIVITY)  TROPONIN I (HIGH SENSITIVITY)    EKG EKG Interpretation  Date/Time:  Sunday Jan 10 2022 17:18:47 EDT Ventricular Rate:  100 PR Interval:  117 QRS Duration: 131 QT Interval:  402 QTC Calculation: 519 R Axis:   15 Text Interpretation: Ectopic atrial tachycardia, unifocal Left bundle branch block Since prior ECG, ectopic atrial rhythm is new Confirmed by Gareth Morgan [phone]) on 01/10/2022 5:36:18 PM  Radiology DG Chest 2 View  Result Date: 01/10/2022 CLINICAL DATA:  Chest pain with shortness of breath. EXAM: CHEST - 2 VIEW COMPARISON:  Chest x-ray 12/22/2021 FINDINGS: The heart size and mediastinal contours are within normal limits. Both lungs are clear. The visualized skeletal structures are unremarkable. IMPRESSION: No active cardiopulmonary disease. Electronically Signed   By: Ronney Asters M.D.   On: 01/10/2022 17:51   CT Angio Chest PE W and/or Wo Contrast  Result Date: 01/10/2022 CLINICAL DATA:  Central chest pain EXAM: CT  ANGIOGRAPHY CHEST WITH CONTRAST TECHNIQUE: Multidetector CT imaging of the chest was performed using the standard protocol during bolus administration of intravenous contrast. Multiplanar CT image reconstructions and MIPs were obtained to evaluate the vascular anatomy. RADIATION DOSE REDUCTION: This exam was performed according to the departmental dose-optimization program which includes automated exposure control, adjustment of the mA and/or kV according to patient size and/or use of iterative reconstruction technique. CONTRAST:  52m OMNIPAQUE IOHEXOL 350 MG/ML SOLN COMPARISON:  07/01/2019 CT chest FINDINGS: Cardiovascular: Satisfactory opacification of the pulmonary arteries to the segmental level. No evidence of pulmonary embolism. Normal heart size. Coronary artery calcifications. No reflux of contrast into the hepatic veins. No pericardial effusion. 4 vessel arch, with an aberrant origin of the right subclavian. Mediastinum/Nodes: No enlarged mediastinal, hilar, or axillary lymph nodes. Thyroid gland, trachea, and esophagus demonstrate no significant findings. Lungs/Pleura: Minimal dependent atelectasis. Lungs are otherwise clear. No pleural effusion or pneumothorax. Upper Abdomen: No acute abnormality. Musculoskeletal: No chest wall abnormality. No acute or significant osseous findings. Healed left anterior sixth rib fracture. Review of the MIP images confirms the above findings. IMPRESSION: Negative for pulmonary embolism.  No acute process in the chest. Electronically Signed   By: AMerilyn BabaM.D.   On: 01/10/2022 19:11    Procedures Procedures    Medications Ordered in ED Medications  aspirin tablet 325 mg (325 mg Oral Given 01/10/22 1744)  nitroGLYCERIN (NITROSTAT) SL tablet 0.4 mg (has no administration in time range)  iohexol (OMNIPAQUE) 350 MG/ML injection 80 mL (80 mLs Intravenous Contrast Given 01/10/22 1852)    ED Course/ Medical Decision Making/ A&P                           Medical  Decision Making Amount and/or Complexity of Data Reviewed Labs: ordered. Radiology: ordered.  Risk OTC drugs. Prescription drug management. Decision regarding hospitalization.   This patient presents to the ED for concern of chest pain, this involves a number of treatment options, and is a complaint that carries with it a  high risk of complications and morbidity.  The differential diagnosis includes but not limited to ACS, PE, pneumonia.    Co morbidities: Discussed in HPI   Brief History:  Patient with newly diagnosed cirrhosis under the care of [facility] presents to the ED with chest pain along the substernal area that broke him into sweats, also began to feel nauseated.  Some improvement after nitroglycerin.  Arrived to the ED with pain 7 out of 10, given aspirin with without much improvement in his symptoms.  Recent echo by PCP but no cardiology care in the past.  EMR reviewed including pt PMHx, past surgical history and past visits to ER.   See HPI for more details   Lab Tests:  I ordered and independently interpreted labs.  The pertinent results include:    Labs notable for CBC with a hemoglobin of 10.1, denies any sources of bleeding.  Platelets are now 29, according to wife at the bedside this is good for him, he has had platelets as low as 17 and was showing nosebleeds at that point.  Hepatic function remarkable for slight elevation in AST, history of alcohol addiction, has not had alcoholic beverage in 027 days, total bili is 3.2.  Newly diagnosed cirrhosis.  BMP with a potassium of 3.2, currently on oral potassium daily.  Levels within normal limits.  First troponin is negative.  BNP is normal.   Imaging Studies:  NAD. I personally reviewed all imaging studies and no acute abnormality found. I agree with radiology interpretation.  On chest x-ray.  CT angio negative for pulmonary embolism on today's visit.    Cardiac Monitoring:  EKG with new ectopic atrial  rhythm, heart rate 100. EKG non-ischemic   Medicines ordered:  I ordered medication including aspirin, nitro  for chest pain Reevaluation of the patient after these medicines showed that the patient stayed the same I have reviewed the patients home medicines and have made adjustments as needed   Reevaluation:  After the interventions noted above I re-evaluated patient and found that they have :stayed the same   Social Determinants of Health:  The patient's social determinants of health were a factor in the care of this patient    Problem List / ED Course:  Patient with underlying history of hypertension, newly diagnosed cirrhosis here with worsening chest pain which broke him into sweats along with nausea.  Labs with some derangements noted, platelets as low as 29 however within patient's baseline.  No prior hematology visit.  Continues to have pain 7 out of 10 on today's visit.  CT negative for pulmonary embolism.  Heart score is 4, I do feel that patient needs admission at this time for chest pain observation.  Will call hospitalist for further admission.  Given nitroglycerin once again for pain control.   Dispostion:  After consideration of the diagnostic results and the patients response to treatment, I feel that the patent would benefit from admission.  Spoke to Dr. Nevada Crane who will admit patient for further management along with cardiology evaluation at [facility].    Portions of this note were generated with [employer]. Dictation errors may occur despite best attempts at proofreading.   Final Clinical Impression(s) / ED Diagnoses Final diagnoses:  Unstable angina [facility])    Rx / DC Orders ED Discharge Orders     None         Janeece Fitting, PA-C 01/10/22 2102    Gareth Morgan, MD 01/11/22 [phone]

## 2022-01-10 NOTE — H&P (Signed)
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  The evening of 5/21 the patient began to experience chest discomfort at rest.  Patient describes this chest discomfort as midsternal, sharp in quality and nonradiating.  Patient complains of associated nausea and diaphoresis.  Patient did take a dose of nitroglycerin which did improve the pain slightly.  Patient denies any associated shortness of breath, cough, fever or peripheral edema.  Due to persisting symptoms the patient eventually presented to [place] emergency department for evaluation.  Upon evaluation in the emergency department troponin was found to be unremarkable.  EKG revealed no dynamic ST segment change.  That being said, ER provider felt clinically the patient would benefit from hospitalization for work-up.  Patient was excepted for transfer to [facility] for continued work-up and medical management.  Review of Systems: Review of Systems  Constitutional:  Positive for diaphoresis.  Cardiovascular:  Positive for chest pain.    Past Medical History:  Diagnosis Date   Alcohol addiction ([place])    Anxiety    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HCC) 02/17/3150   Chronic fatigue    Cirrhosis (HCC)    Colon polyps    Depression    GERD (gastroesophageal reflux disease)    Hemochromatosis    Hx of blood clots    Leg    Hyperreflexia    Hypertension    PTSD (post-traumatic stress disorder)    Traumatic hemorrhagic shock [facility])     Past Surgical History:  Procedure Laterality Date   BIOPSY  09/24/2021   Procedure: BIOPSY;  Surgeon: Sharyn Creamer, MD;  Location: [facility] ENDOSCOPY;  Service: Gastroenterology;;   Wilmon Pal[place] RELEASE Bilateral    COLONOSCOPY WITH PROPOFOL N/A 09/24/2021   Procedure: COLONOSCOPY WITH PROPOFOL;  Surgeon: Sharyn Creamer, MD;  Location: [place];  Service: Gastroenterology;  Laterality: N/A;   ESOPHAGOGASTRODUODENOSCOPY (EGD) WITH PROPOFOL N/A 09/24/2021   Procedure: ESOPHAGOGASTRODUODENOSCOPY (EGD) WITH PROPOFOL;  Surgeon: Sharyn Creamer, MD;  Location: [place];  Service: Gastroenterology;  Laterality: N/A;   FRACTURE SURGERY     left ankle plate   HERNIA REPAIR     inguinal   KNEE SURGERY Right    x 4   SHOULDER SURGERY Bilateral    x 2   VASECTOMY      Social History:  reports that he has never smoked. His smokeless tobacco use includes snuff. He reports that he does not currently use alcohol. He reports that he does not use drugs.  Allergies  Allergen Reactions   Charentais Melon (French Melon) Anaphylaxis   Cucumber Extract Itching and Nausea And Vomiting    No extracts; just cucumber    Peanut Butter Flavor Anaphylaxis and Swelling   Peanut Oil Swelling   Shellfish Allergy Itching and Swelling   Cantaloupe Extract Allergy Skin Test Rash   Lactose Other (See Comments)    Lactose intolerant - causes indigestion   Strawberry Extract Nausea And Vomiting and  Swelling   Vancomycin Rash   Apple Juice Swelling   Codeine Itching    itching   Depakote Er [Divalproex Sodium Er]     Tongue swelling    Family History  Problem Relation Age of Onset   Pulmonary fibrosis Mother    Hypertension Father    Other Father        liver failure   Diabetes Brother    Breast cancer Paternal Aunt    Colon cancer Neg Hx    Esophageal cancer Neg Hx    Stomach cancer Neg  Hx    Rectal cancer Neg Hx     Prior to Admission medications   Medication Sig Start Date End Date Taking? Authorizing Provider  aspirin 81 MG chewable tablet Chew 81 mg by mouth every morning.   Yes [provider]  B Complex Vitamins (B COMPLEX PO) Take 1 tablet by mouth every morning.   Yes [provider]  busPIRone (BUSPAR) 15 MG tablet Take 15 mg by mouth See admin instructions. Take one tablet (15 mg) by mouth twice daily - 9am and 6pm 08/27/21  Yes [provider]  busPIRone (BUSPAR) 15 MG tablet TAKE ONE-HALF TABLET BY MOUTH TWICE A DAY FOR 7 DAYS, THEN TAKE ONE TABLET TWICE A DAY FOR ANXIETY AND IRRITABILITY 12/08/21  Yes [provider]  cyclobenzaprine (FLEXERIL) 10 MG tablet Take 1/2 to 1 tablet by mouth at bedtime, then increase gradually to 1 tablet 3 times a day 12/22/21  Yes Silverio Decamp, MD  EPINEPHrine 0.3 mg/0.3 mL IJ SOAJ injection INJECT 0.3 MG INTO THE MUSCLE AS NEEDED FOR ANAPHYLAXIS. Patient taking differently: Inject 0.3 mg into the muscle once as needed for anaphylaxis (severe allergic reaction). 04/08/21 04/08/22 Yes Hali Marry, MD  folic acid (FOLVITE) 1 MG tablet TAKE 1 TABLET (1 MG TOTAL) BY MOUTH DAILY. Patient taking differently: Take 1 mg by mouth every morning. 06/22/21 06/22/22 Yes Silverio Decamp, MD  Magnesium Chloride 64 MG TABS Take 2 tablets by mouth in the morning and at bedtime. 11/18/21  Yes [name], DO  Multiple Vitamins-Minerals (YOUR LIFE MULTI ADULT GUMMIES) CHEW Chew 1 tablet by mouth daily.   Yes [provider]  ondansetron (ZOFRAN-ODT) 8 MG disintegrating tablet Dissolve 1 tablet (8 mg total) by mouth every 8 (eight) hours as needed for nausea. 07/14/21  Yes Terrilyn Saver, NP  pantoprazole (PROTONIX) 40 MG tablet Take 1 tablet (40 mg total) by mouth daily. 11/20/21 11/20/22 Yes Sharyn Creamer, MD  prazosin (MINIPRESS) 2 MG capsule Take 4 mg by mouth at bedtime. 05/28/21  Yes  [provider]  saccharomyces boulardii (FLORASTOR) 250 MG capsule Take 250 mg by mouth 2 (two) times daily.    Yes [provider]  thiamine 100 MG tablet Take 100 mg by mouth daily. 11/14/19  Yes [provider]  vortioxetine HBr (TRINTELLIX) 10 MG TABS tablet Take 1 tablet (10 mg total) by mouth daily. 09/02/21  Yes [name], DO  Cholecalciferol 50 MCG (2000 UT) CAPS Take 2,000 Units by mouth every morning.    [provider]  potassium chloride SA (KLOR-CON M) 20 MEQ tablet Take 2 tablets (40 mEq total) by mouth 2 (two) times daily. 11/18/21 12/18/21  [name], DO  predniSONE (DELTASONE) 50 MG tablet Take 1 tablet by mouth daily for 5 days 12/22/21   Silverio Decamp, MD    Physical Exam:  Vitals:   01/10/22 2130 01/10/22 2200 01/10/22  2300 01/10/22 2356  BP: 135/85 137/88 (!) 151/96 (!) 133/91  Pulse: 90 84    Resp: 18 11 13 12   Temp: 98.2 F (36.8 C)  98.2 F (36.8 C)   TempSrc:   Oral   SpO2: 95% 95% 94% 95%  Weight:      Height:        Constitutional: Awake alert and oriented x3, no associated distress.   Skin: no rashes, no lesions, good skin turgor noted. Eyes: Pupils are equally reactive to light.  No evidence of scleral icterus or conjunctival pallor.  ENMT: Moist mucous membranes noted.  Posterior pharynx clear of any exudate or lesions.   Neck: normal, supple, no masses, no thyromegaly.  No evidence of jugular venous distension.   Respiratory: clear to auscultation bilaterally, no wheezing, no crackles. Normal respiratory effort. No accessory muscle use.  Cardiovascular: Regular rate and rhythm, 2/6 systolic murmur noted, no extremity edema. 2+ pedal pulses. No carotid bruits.  Chest:   Nontender without crepitus or deformity.   Back:   Nontender without crepitus or deformity. Abdomen: Abdomen is slightly protuberant but soft and nontender.  No evidence of intra-abdominal masses.  Positive bowel sounds noted in all  quadrants.   Musculoskeletal: No joint deformity upper and lower extremities. Good ROM, no contractures. Normal muscle tone.  Neurologic: Slightly tremulous at rest . CN 2-12 grossly intact. Sensation intact.  Patient moving all 4 extremities spontaneously.  Patient is following all commands.  Patient is responsive to verbal stimuli.   Psychiatric: Patient exhibits normal mood with appropriate affect.  Patient seems to possess insight as to their current situation.    Data Reviewed:  I have personally reviewed and interpreted labs, imaging.  Significant findings are:  Chemistry revealing sodium 134, potassium 3.2, BUN 7, creatinine 0.62.   Troponin is noted to be 10 and 10.   CBC revealing white blood cell count 4.1, hemoglobin 10.1, hematocrit 29.3, platelet count 29.  BUN 85.4.   Hepatic function panel revealing albumin 3.2, AST 102, total bilirubin 3.2, direct bilirubin 0.9, indirect bilirubin 2.3.   EKG: Personally reviewed.  Rhythm is normal sinus rhythm with heart rate of 78 bpm.  No dynamic ST segment changes appreciated.   Assessment and Plan: * Chest pain Patient presenting with chest discomfort with typical and atypical features Troponin unremarkable thus far, continue to cycle cardiac enzymes EKG reveals no dynamic ST segment change CT angiogram reveals no evidence of pulmonary embolism or acute cardiopulmonary disease Patient continues to complain of mild chest discomfort Possibly uncontrolled gastroesophageal reflux disease and esophageal spasm-we will try a trial of Maalox followed by scheduled PPI N.p.o. after midnight in case of cardiology recommending noninvasive ischemic work-up Receiving daily aspirin As needed nitroglycerin for further episodes of chest discomfort Echocardiogram in 2022 relatively unremarkable, will not repeat ESR pending   Hypokalemia Replacing with potassium chloride Additionally replacing concurrent hypomagnesemia  Monitoring potassium  levels with serial chemistries.   Hypomagnesemia Replacing with intravenous magnesium sulfate Monitoring magnesium levels with serial chemistries.   Chronic alcoholic liver disease ([place]) Patient complaining some degree of abdominal bloating and discomfort, will obtain ascites survey Following with Dr. [name] with gastroenterology in the outpatient setting Status post recent EGD demonstrating evidence of portal hypertensive gastropathy with no evidence of varices Additionally suffering from chronic thrombocytopenia Continue outpatient gastroenterology follow-up  Alcohol abuse Patient reports that he continues to be abstinent of alcohol Continue to encourage and counsel patient on cessation  Thrombocytopenia ([place]) Longstanding chronic substantial  thrombocytopenia secondary to liver disease No clinical evidence of bleeding Monitoring platelet counts with serial CBCs  Benign essential hypertension Documented history of hypertension however patient is not on any outpatient antihypertensives As needed intravenous antihypertensives for markedly elevated blood pressure We will monitor blood pressures closely and titrate on oral antihypertensives as necessary  Alcoholic myopathy Patient is a walker for ambulation Fall precautions PT evaluation       Code Status:  Full code  code status decision has been confirmed with: patient Family Communication: deferred   Consults:   Severity of Illness:  The appropriate patient status for this patient is OBSERVATION. Observation status is judged to be reasonable and necessary in order to provide the required intensity of service to ensure the patient's safety. The patient's presenting symptoms, physical exam findings, and initial radiographic and laboratory data in the context of their medical condition is felt to place them at decreased risk for further clinical deterioration. Furthermore, it is anticipated that the patient will be  medically stable for discharge from the hospital within 2 midnights of admission.   Author:  Vernelle Emerald MD  01/11/2022 6:01 AM

## 2022-01-11 ENCOUNTER — Other Ambulatory Visit: Payer: Self-pay

## 2022-01-11 ENCOUNTER — Observation Stay (HOSPITAL_COMMUNITY): Payer: 59

## 2022-01-11 ENCOUNTER — Encounter (HOSPITAL_COMMUNITY): Payer: Self-pay | Admitting: Internal Medicine

## 2022-01-11 ENCOUNTER — Observation Stay (HOSPITAL_BASED_OUTPATIENT_CLINIC_OR_DEPARTMENT_OTHER): Payer: 59

## 2022-01-11 DIAGNOSIS — I350 Nonrheumatic aortic (valve) stenosis: Secondary | ICD-10-CM | POA: Insufficient documentation

## 2022-01-11 DIAGNOSIS — E440 Moderate protein-calorie malnutrition: Secondary | ICD-10-CM | POA: Insufficient documentation

## 2022-01-11 DIAGNOSIS — F1010 Alcohol abuse, uncomplicated: Secondary | ICD-10-CM | POA: Diagnosis not present

## 2022-01-11 DIAGNOSIS — Z91010 Allergy to peanuts: Secondary | ICD-10-CM | POA: Diagnosis not present

## 2022-01-11 DIAGNOSIS — R079 Chest pain, unspecified: Secondary | ICD-10-CM | POA: Diagnosis not present

## 2022-01-11 DIAGNOSIS — I10 Essential (primary) hypertension: Secondary | ICD-10-CM | POA: Diagnosis not present

## 2022-01-11 DIAGNOSIS — I200 Unstable angina: Secondary | ICD-10-CM | POA: Diagnosis not present

## 2022-01-11 DIAGNOSIS — K7030 Alcoholic cirrhosis of liver without ascites: Secondary | ICD-10-CM | POA: Diagnosis not present

## 2022-01-11 DIAGNOSIS — R0789 Other chest pain: Secondary | ICD-10-CM | POA: Diagnosis not present

## 2022-01-11 DIAGNOSIS — R188 Other ascites: Secondary | ICD-10-CM | POA: Diagnosis not present

## 2022-01-11 DIAGNOSIS — E876 Hypokalemia: Secondary | ICD-10-CM | POA: Diagnosis present

## 2022-01-11 DIAGNOSIS — D696 Thrombocytopenia, unspecified: Secondary | ICD-10-CM | POA: Diagnosis not present

## 2022-01-11 HISTORY — DX: Alcohol abuse, uncomplicated: F10.10

## 2022-01-11 HISTORY — DX: Hypomagnesemia: E83.42

## 2022-01-11 HISTORY — DX: Hypokalemia: E87.6

## 2022-01-11 HISTORY — DX: Moderate protein-calorie malnutrition: E44.0

## 2022-01-11 LAB — CBC WITH DIFFERENTIAL/PLATELET
Abs Immature Granulocytes: 0.01 10*3/uL (ref 0.00–0.07)
Basophils Absolute: 0 10*3/uL (ref 0.0–0.1)
Basophils Relative: 1 %
Eosinophils Absolute: 0.1 10*3/uL (ref 0.0–0.5)
Eosinophils Relative: 3 %
HCT: 29 % — ABNORMAL LOW (ref 39.0–52.0)
Hemoglobin: 10.1 g/dL — ABNORMAL LOW (ref 13.0–17.0)
Immature Granulocytes: 0 %
Lymphocytes Relative: 28 %
Lymphs Abs: 1.1 10*3/uL (ref 0.7–4.0)
MCH: 32.1 pg (ref 26.0–34.0)
MCHC: 34.8 g/dL (ref 30.0–36.0)
MCV: 92.1 fL (ref 80.0–100.0)
Monocytes Absolute: 0.6 10*3/uL (ref 0.1–1.0)
Monocytes Relative: 15 %
Neutro Abs: 2 10*3/uL (ref 1.7–7.7)
Neutrophils Relative %: 53 %
Platelets: 25 10*3/uL — CL (ref 150–400)
RBC: 3.15 MIL/uL — ABNORMAL LOW (ref 4.22–5.81)
RDW: 15.5 % (ref 11.5–15.5)
WBC: 3.8 10*3/uL — ABNORMAL LOW (ref 4.0–10.5)
nRBC: 0 % (ref 0.0–0.2)

## 2022-01-11 LAB — COMPREHENSIVE METABOLIC PANEL
ALT: 20 U/L (ref 0–44)
AST: 86 U/L — ABNORMAL HIGH (ref 15–41)
Albumin: 2.7 g/dL — ABNORMAL LOW (ref 3.5–5.0)
Alkaline Phosphatase: 78 U/L (ref 38–126)
Anion gap: 10 (ref 5–15)
BUN: 7 mg/dL (ref 6–20)
CO2: 23 mmol/L (ref 22–32)
Calcium: 8.8 mg/dL — ABNORMAL LOW (ref 8.9–10.3)
Chloride: 101 mmol/L (ref 98–111)
Creatinine, Ser: 0.73 mg/dL (ref 0.61–1.24)
GFR, Estimated: >60 mL/min (ref >60)
Glucose, Bld: 86 mg/dL (ref 70–99)
Potassium: 3 mmol/L — ABNORMAL LOW (ref 3.5–5.1)
Sodium: 134 mmol/L — ABNORMAL LOW (ref 135–145)
Total Bilirubin: 3.9 mg/dL — ABNORMAL HIGH (ref 0.3–1.2)
Total Protein: 6.5 g/dL (ref 6.5–8.1)

## 2022-01-11 LAB — SEDIMENTATION RATE: Sed Rate: 33 mm/hr — ABNORMAL HIGH (ref 0–16)

## 2022-01-11 LAB — ECHOCARDIOGRAM COMPLETE
AR max vel: 1.47 cm2
AV Area VTI: 1.55 cm2
AV Area mean vel: 1.59 cm2
AV Mean grad: 14 mmHg
AV Peak grad: 25.6 mmHg
Ao pk vel: 2.53 m/s
Area-P 1/2: 4.6 cm2
Calc EF: 64.4 %
Height: 65 in
P 1/2 time: 584 msec
S' Lateral: 3 cm
Single Plane A2C EF: 64.7 %
Single Plane A4C EF: 63.9 %
Weight: 2608 oz

## 2022-01-11 LAB — TROPONIN I (HIGH SENSITIVITY): Troponin I (High Sensitivity): 12 ng/L (ref <18)

## 2022-01-11 LAB — NM MYOCAR MULTI W/SPECT W/WALL MOTION / EF
Estimated workload: 1
Exercise duration (min): 5 min
Exercise duration (sec): 15 s
Peak HR: 96 {beats}/min
Rest HR: 75 {beats}/min
ST Depression (mm): 0 mm

## 2022-01-11 LAB — MAGNESIUM: Magnesium: 0.9 mg/dL — CL (ref 1.7–2.4)

## 2022-01-11 MED ORDER — HYDRALAZINE HCL 20 MG/ML IJ SOLN: 10.0000 mg | Freq: Four times a day (QID) | INTRAMUSCULAR | Status: DC | PRN

## 2022-01-11 MED ORDER — ACETAMINOPHEN 325 MG PO TABS: 650.0000 mg | ORAL_TABLET | Freq: Four times a day (QID) | ORAL | Status: DC | PRN

## 2022-01-11 MED ORDER — ONDANSETRON HCL 4 MG PO TABS
4.0000 mg | ORAL_TABLET | Freq: Four times a day (QID) | ORAL | Status: DC | PRN
Start: 2022-01-11 — End: 2022-01-12

## 2022-01-11 MED ORDER — MELATONIN 5 MG PO TABS: 10.0000 mg | ORAL_TABLET | Freq: Every evening | ORAL | Status: DC | PRN

## 2022-01-11 MED ORDER — BUSPIRONE HCL 5 MG PO TABS: 15.0000 mg | ORAL_TABLET | ORAL | Status: DC

## 2022-01-11 MED ORDER — ACETAMINOPHEN 650 MG RE SUPP: 650.0000 mg | Freq: Four times a day (QID) | RECTAL | Status: DC | PRN

## 2022-01-11 MED ORDER — PANTOPRAZOLE SODIUM 40 MG PO TBEC: 40.0000 mg | DELAYED_RELEASE_TABLET | Freq: Every day | ORAL | Status: DC

## 2022-01-11 MED ORDER — ADULT MULTIVITAMIN W/MINERALS CH
1.0000 | ORAL_TABLET | Freq: Every day | ORAL | Status: DC
  Administered 2022-01-11 – 2022-01-12 (×2): 1 via ORAL
  Filled 2022-01-11 (×2): qty 1

## 2022-01-11 MED ORDER — PANTOPRAZOLE SODIUM 40 MG PO TBEC
40.0000 mg | DELAYED_RELEASE_TABLET | Freq: Every day | ORAL | Status: DC
  Administered 2022-01-12: 40 mg via ORAL
  Filled 2022-01-11: qty 1

## 2022-01-11 MED ORDER — TECHNETIUM TC 99M TETROFOSMIN IV KIT
10.7000 | PACK | Freq: Once | INTRAVENOUS | Status: AC | PRN
  Administered 2022-01-11: 10.7 via INTRAVENOUS

## 2022-01-11 MED ORDER — ASPIRIN 81 MG PO CHEW: 81.0000 mg | CHEWABLE_TABLET | Freq: Every morning | ORAL | Status: DC

## 2022-01-11 MED ORDER — REGADENOSON 0.4 MG/5ML IV SOLN
INTRAVENOUS | Status: AC
Start: 2022-01-11 — End: 2022-01-11
  Administered 2022-01-11: 0.4 mg via INTRAVENOUS
  Filled 2022-01-11: qty 5

## 2022-01-11 MED ORDER — NITROGLYCERIN 0.4 MG SL SUBL: 0.4000 mg | SUBLINGUAL_TABLET | SUBLINGUAL | Status: DC | PRN

## 2022-01-11 MED ORDER — SODIUM CHLORIDE 0.9 % IV SOLN: INTRAVENOUS | Status: DC | PRN

## 2022-01-11 MED ORDER — ENSURE ENLIVE PO LIQD
237.0000 mL | Freq: Three times a day (TID) | ORAL | Status: DC
  Administered 2022-01-11: 237 mL via ORAL

## 2022-01-11 MED ORDER — REGADENOSON 0.4 MG/5ML IV SOLN
0.4000 mg | Freq: Once | INTRAVENOUS | Status: AC
  Filled 2022-01-11: qty 5

## 2022-01-11 MED ORDER — POTASSIUM CHLORIDE CRYS ER 20 MEQ PO TBCR
40.0000 meq | EXTENDED_RELEASE_TABLET | ORAL | Status: AC
  Administered 2022-01-11 (×2): 40 meq via ORAL
  Filled 2022-01-11 (×2): qty 2

## 2022-01-11 MED ORDER — MAGNESIUM SULFATE 4 GM/100ML IV SOLN
4.0000 g | Freq: Once | INTRAVENOUS | Status: AC
  Administered 2022-01-11: 4 g via INTRAVENOUS
  Filled 2022-01-11: qty 100

## 2022-01-11 MED ORDER — BUSPIRONE HCL 5 MG PO TABS
15.0000 mg | ORAL_TABLET | Freq: Two times a day (BID) | ORAL | Status: DC
  Administered 2022-01-11 – 2022-01-12 (×3): 15 mg via ORAL
  Filled 2022-01-11 (×3): qty 3

## 2022-01-11 MED ORDER — PANTOPRAZOLE SODIUM 40 MG IV SOLR
40.0000 mg | Freq: Once | INTRAVENOUS | Status: AC
  Administered 2022-01-11: 40 mg via INTRAVENOUS
  Filled 2022-01-11: qty 10

## 2022-01-11 MED ORDER — ACETAMINOPHEN 650 MG RE SUPP
325.0000 mg | Freq: Four times a day (QID) | RECTAL | Status: DC | PRN
Start: 2022-01-11 — End: 2022-01-12

## 2022-01-11 MED ORDER — ONDANSETRON HCL 4 MG/2ML IJ SOLN: 4.0000 mg | Freq: Four times a day (QID) | INTRAMUSCULAR | Status: DC | PRN

## 2022-01-11 MED ORDER — TECHNETIUM TC 99M TETROFOSMIN IV KIT
31.0000 | PACK | Freq: Once | INTRAVENOUS | Status: AC | PRN
  Administered 2022-01-11: 31 via INTRAVENOUS

## 2022-01-11 MED ORDER — ALUM & MAG HYDROXIDE-SIMETH 200-200-20 MG/5ML PO SUSP
30.0000 mL | Freq: Once | ORAL | Status: AC
  Administered 2022-01-11: 30 mL via ORAL
  Filled 2022-01-11: qty 30

## 2022-01-11 MED ORDER — POLYETHYLENE GLYCOL 3350 17 G PO PACK: 17.0000 g | PACK | Freq: Every day | ORAL | Status: DC | PRN

## 2022-01-11 MED ORDER — ACETAMINOPHEN 325 MG PO TABS
325.0000 mg | ORAL_TABLET | Freq: Four times a day (QID) | ORAL | Status: DC | PRN
  Administered 2022-01-11 (×2): 325 mg via ORAL
  Filled 2022-01-11 (×2): qty 1

## 2022-01-11 NOTE — Assessment & Plan Note (Addendum)
   Patient presenting with chest discomfort with typical and atypical features Troponin unremarkable thus far, continue to cycle cardiac enzymes CT angiogram reveals no evidence of pulmonary embolism or acute cardiopulmonary disease Possibly uncontrolled gastroesophageal reflux disease and esophageal spasm-we will try a trial of Maalox followed by scheduled PPI Stress test pending

## 2022-01-11 NOTE — Evaluation (Signed)
Physical Therapy Evaluation Patient Details Name: Juan [name] MRN: [id number] DOB: May 08, 1966 Today's Date: 01/11/2022  History of Present Illness  56 yo male admitted 5/21 with chest pain. PMHx:anxiety disorder, PTSD, alcohol abuse (now abstinent), alcoholic myopathy, HTN, alcoholic cirrhosis, chronic thrombocytopenia  Clinical Impression  PT pleasant and reports repeated frequent falls with progression from cane to RW use and all falls have occurred with either cane or no AD. Pt educated for need to have railing installed in bathroom (states RW does not fit) and additional railing on right at stairs to enter home. Pt stated help and progression from prior HHPT and would be appropriate again. Pt also educated for walking program and alarm to increase time active at home to limit deconditioning. Pt with decreased strength, balance and function who will benefit from acute therapy to maximize mobility and safety.   HR 88-97 at rest, up to 110 with hall ambulation and 122bpm with stairs SPO2 98% on RA Chest pain maintained at 4/10 throughout     Recommendations for follow up therapy are one component of a multi-disciplinary discharge planning process, led by the attending physician.  Recommendations may be updated based on patient status, additional functional criteria and insurance authorization.  Follow Up Recommendations Home health PT    Assistance Recommended at Discharge Intermittent Supervision/Assistance  Patient can return home with the following  A little help with walking and/or transfers;A little help with bathing/dressing/bathroom;Assistance with cooking/housework;Help with stairs or ramp for entrance    Equipment Recommendations None recommended by PT  Recommendations for Other Services       Functional Status Assessment Patient has had a recent decline in their functional status and demonstrates the ability to make significant improvements in function in a reasonable and  predictable amount of time.     Precautions / Restrictions Precautions Precautions: Fall      Mobility  Bed Mobility Overal bed mobility: Modified Independent             General bed mobility comments: bed flat  with increased time and effort    Transfers Overall transfer level: Needs assistance   Transfers: Sit to/from Stand Sit to Stand: Min guard           General transfer comment: cues for hand placement and backing fully to surface    Ambulation/Gait Ambulation/Gait assistance: Min guard Gait Distance (Feet): 150 Feet Assistive device: Rolling walker (2 wheels) Gait Pattern/deviations: Step-through pattern, Decreased stride length   Gait velocity interpretation: >2.62 ft/sec, indicative of community ambulatory   General Gait Details: pt walked 150' x 2 trials with use of RW and seated rest on stairs between bouts. pt with unsteady gait with cues to step into RW  Stairs Stairs: Yes Stairs assistance: Min guard Stair Management: Step to pattern, Forwards, One rail Left Number of Stairs: 3 General stair comments: pt with heavy reliance on rail with good sequence and nearly fully turned sideways to grasp rail with both hands  Wheelchair Mobility    Modified Rankin (Stroke Patients Only)       Balance Overall balance assessment: Needs assistance, History of Falls   Sitting balance-Leahy Scale: Fair     Standing balance support: Bilateral upper extremity supported Standing balance-Leahy Scale: Poor Standing balance comment: reliant on bil UE in standing                             Pertinent Vitals/Pain Pain Assessment Pain Assessment: 0-10 Pain  Score: 4  Pain Location: chest Pain Descriptors / Indicators: Aching Pain Intervention(s): Limited activity within patient's tolerance, Repositioned    Home Living Family/patient expects to be discharged to:: Private residence Living Arrangements: Spouse/significant other Available Help  at Discharge: Available PRN/intermittently;Family Type of Home: House Home Access: Stairs to enter Entrance Stairs-Rails: Left Entrance Stairs-Number of Steps: 4   Home Layout: One level Home Equipment: Rollator (4 wheels);Cane - quad;BSC/3in1;Grab bars - tub/shower;Wheelchair - Psychologist, educational;Shower seat;Grab bars - toilet;Other (comment) (adjustable bed)      Prior Function Prior Level of Function : Needs assist       Physical Assist : ADLs (physical)     Mobility Comments: pt has had frequent falls 2 in the last week when not using RW ADLs Comments: wife assists with socks at times and with getting into/out of the tub     Hand Dominance        Extremity/Trunk Assessment   Upper Extremity Assessment Upper Extremity Assessment: Generalized weakness    Lower Extremity Assessment Lower Extremity Assessment: LLE deficits/detail LLE Deficits / Details: weakness with ataxia    Cervical / Trunk Assessment Cervical / Trunk Assessment: Other exceptions Cervical / Trunk Exceptions: rounded shoulders  Communication   Communication: No difficulties  Cognition Arousal/Alertness: Awake/alert Behavior During Therapy: Flat affect Overall Cognitive Status: Within Functional Limits for tasks assessed                                          General Comments      Exercises     Assessment/Plan    PT Assessment Patient needs continued PT services  PT Problem List Decreased strength;Decreased mobility;Decreased activity tolerance;Decreased balance;Decreased knowledge of use of DME       PT Treatment Interventions DME instruction;Therapeutic exercise;Gait training;Stair training;Functional mobility training;Therapeutic activities;Patient/family education;Balance training    PT Goals (Current goals can be found in the Care Plan section)  Acute Rehab PT Goals Patient Stated Goal: return home. watch hockey and golf PT Goal Formulation: With  patient Time For Goal Achievement: 01/25/22 Potential to Achieve Goals: Fair    Frequency Min 2X/week     Co-evaluation               AM-PAC PT "6 Clicks" Mobility  Outcome Measure Help needed turning from your back to your side while in a flat bed without using bedrails?: None Help needed moving from lying on your back to sitting on the side of a flat bed without using bedrails?: None Help needed moving to and from a bed to a chair (including a wheelchair)?: A Little Help needed standing up from a chair using your arms (e.g., wheelchair or bedside chair)?: A Little Help needed to walk in hospital room?: A Little Help needed climbing 3-5 steps with a railing? : A Little 6 Click Score: 20    End of Session   Activity Tolerance: Patient tolerated treatment well Patient left: in chair;with call bell/phone within reach;with chair alarm set Nurse Communication: Mobility status PT Visit Diagnosis: Other abnormalities of gait and mobility (R26.89);Difficulty in walking, not elsewhere classified (R26.2);Muscle weakness (generalized) (M62.81);History of falling (Z91.81)    Time: 0726-0750 PT Time Calculation (min) (ACUTE ONLY): 24 min   Charges:   PT Evaluation $PT Eval Moderate Complexity: 1 Mod PT Treatments $Gait Training: 8-22 mins        Andrae Claunch P, PT Acute Rehabilitation  Services Pager: [phone] Office: [place] 01/11/2022, 7:57 AM

## 2022-01-11 NOTE — Assessment & Plan Note (Signed)
   Longstanding chronic substantial thrombocytopenia secondary to liver disease  No clinical evidence of bleeding  Monitoring platelet counts with serial CBCs

## 2022-01-11 NOTE — Hospital Course (Signed)
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  ? GI related

## 2022-01-11 NOTE — Assessment & Plan Note (Signed)
   Patient reports that he continues to be abstinent of alcohol  Continue to encourage and counsel patient on cessation

## 2022-01-11 NOTE — Progress Notes (Signed)
Pt. Told this RN that he is supposed to wear a CPCP at night. Dr. Nevada Crane paged to see if one could be ordered in the hospital.

## 2022-01-11 NOTE — Assessment & Plan Note (Addendum)
   Patient complaining some degree of abdominal bloating and discomfort-- no sign of ascities  Following with Dr. [name] with gastroenterology in the outpatient setting  Status post recent EGD demonstrating evidence of portal hypertensive gastropathy with no evidence of varices  Continue outpatient gastroenterology follow-up

## 2022-01-11 NOTE — Progress Notes (Signed)
Notified by CCMD of a small run of junctional rhythm on 5/22 around 01:47 then back into NSR

## 2022-01-11 NOTE — Evaluation (Signed)
Occupational Therapy Evaluation Patient Details Name: Juan [name] MRN: [id number] DOB: 1966-04-15 Today's Date: 01/11/2022   History of Present Illness 56 yo male admitted 5/21 with chest pain. PMHx:anxiety disorder, PTSD, alcohol abuse (now abstinent), alcoholic myopathy, HTN, alcoholic cirrhosis, chronic thrombocytopenia   Clinical Impression   Pt currently at min guard assist for toilet transfers and selfcare sit to stand with use of the RW for support.  He will be alone for periods of time at home since his spouse works and will benefit from use of the RW instead of the cane secondary to his current instability.  Recommend continued acute care OT to address current weakness and balance deficits as they impact ADL function.  Will benefit from [facility] eval for safety as well.       Recommendations for follow up therapy are one component of a multi-disciplinary discharge planning process, led by the attending physician.  Recommendations may be updated based on patient status, additional functional criteria and insurance authorization.   Follow Up Recommendations  Home health OT    Assistance Recommended at Discharge PRN  Patient can return home with the following A little help with bathing/dressing/bathroom;Assistance with cooking/housework;Assist for transportation    Functional Status Assessment  Patient has had a recent decline in their functional status and demonstrates the ability to make significant improvements in function in a reasonable and predictable amount of time.  Equipment Recommendations  None recommended by OT       Precautions / Restrictions Precautions Precautions: Fall Restrictions Weight Bearing Restrictions: No      Mobility Bed Mobility Overal bed mobility: Modified Independent                  Transfers Overall transfer level: Needs assistance Equipment used: Rolling walker (2 wheels) Transfers: Sit to/from Stand, Bed to  chair/wheelchair/BSC Sit to Stand: Min guard     Step pivot transfers: Min guard     General transfer comment: Pt needs mod instructional cueing for staying inside of the walker when turning and to square up to the surface when going to sit down.      Balance Overall balance assessment: Needs assistance, History of Falls   Sitting balance-Leahy Scale: Good     Standing balance support: Bilateral upper extremity supported Standing balance-Leahy Scale: Poor Standing balance comment: Pt needs UE stability for balance.                           ADL either performed or assessed with clinical judgement   ADL Overall ADL's : Needs assistance/impaired Eating/Feeding: Independent;Sitting   Grooming: Oral care;Min guard;Standing       Lower Body Bathing: Min guard;Sit to/from stand Lower Body Bathing Details (indicate cue type and reason): simulated     Lower Body Dressing: Min guard;Sit to/from stand   Toilet Transfer: Min guard;Regular Toilet;Grab bars;Ambulation;Rolling walker (2 wheels)   Toileting- Clothing Manipulation and Hygiene: Min guard;Sit to/from stand       Functional mobility during ADLs: Min guard;Rolling walker (2 wheels) General ADL Comments: Pt with some trunkial and BUE ataxia noted when attempting to remove and donn his socks.  He reports having a walk-in shower at home that he would use with a shower seat.  He also has bars around his toilet as well for use with sit to stand.  His spouse will be working during the day so he will be alone.     Vision Baseline Vision/History: 1 Wears  glasses (has contacts currently) Ability to See in Adequate Light: 0 Adequate Patient Visual Report: No change from baseline Vision Assessment?: No apparent visual deficits     Perception Perception Perception: Within Functional Limits   Praxis Praxis Praxis: Intact    Pertinent Vitals/Pain Pain Assessment Pain Assessment: No/denies pain     Hand  Dominance Right   Extremity/Trunk Assessment Upper Extremity Assessment Upper Extremity Assessment: Generalized weakness (Slight BUE ataxia noted with finger to nose)   Lower Extremity Assessment Lower Extremity Assessment: Defer to PT evaluation LLE Deficits / Details: weakness with ataxia   Cervical / Trunk Assessment Cervical / Trunk Assessment: Other exceptions Cervical / Trunk Exceptions: rounded shoulders   Communication Communication Communication: No difficulties   Cognition Arousal/Alertness: Awake/alert Behavior During Therapy: WFL for tasks assessed/performed Overall Cognitive Status: Within Functional Limits for tasks assessed                                                  Home Living Family/patient expects to be discharged to:: Private residence Living Arrangements: Spouse/significant other Available Help at Discharge: Available PRN/intermittently;Family Type of Home: House Home Access: Stairs to enter [employer] of Steps: 4 Entrance Stairs-Rails: Left Home Layout: One level     Bathroom Shower/Tub: Teacher, early years/pre: Standard     Home Equipment: Rollator (4 wheels);Cane - quad;Grab bars - tub/shower;Wheelchair - Psychologist, educational;Shower seat;Grab bars - toilet;Other (comment) (adjustable bed) Adaptive Equipment: Reacher;Sock aid;Long-handled shoe horn;Long-handled sponge        Prior Functioning/Environment Prior Level of Function : Needs assist       Physical Assist : ADLs (physical)   ADLs (physical): Bathing (getting in and out of the tub) Mobility Comments: pt has had frequent falls 2 in the last week when not using RW ADLs Comments: wife assists with socks at times and with getting into/out of the tub        OT Problem List: Impaired balance (sitting and/or standing)      OT Treatment/Interventions: Self-care/ADL training;Therapeutic exercise;Patient/family education;Balance  training;Therapeutic activities;DME and/or AE instruction    OT Goals(Current goals can be found in the care plan section) Acute Rehab OT Goals Patient Stated Goal: Pt did not state, but agreeable to participation in OT eval OT Goal Formulation: With patient Time For Goal Achievement: 01/25/22 Potential to Achieve Goals: Good  OT Frequency: Min 2X/week       AM-PAC OT "6 Clicks" Daily Activity     Outcome Measure Help from another person eating meals?: None Help from another person taking care of personal grooming?: A Little Help from another person toileting, which includes using toliet, bedpan, or urinal?: A Little Help from another person bathing (including washing, rinsing, drying)?: A Little Help from another person to put on and taking off regular upper body clothing?: None Help from another person to put on and taking off regular lower body clothing?: A Little 6 Click Score: 20   End of Session Equipment Utilized During Treatment: Rolling walker (2 wheels) Nurse Communication: Mobility status  Activity Tolerance: Patient tolerated treatment well Patient left: in bed;with call bell/phone within reach;with nursing/sitter in room  OT Visit Diagnosis: Unsteadiness on feet (R26.81);Other abnormalities of gait and mobility (R26.89);Repeated falls (R29.6);Muscle weakness (generalized) (M62.81)                Time: [id number]  OT Time Calculation (min): 26 min Charges:  OT General Charges $OT Visit: 1 Visit OT Evaluation $OT Eval Moderate Complexity: 1 Mod OT Treatments $Self Care/Home Management : 8-22 mins  Lemoine Goyne OTR/L  01/11/2022, 10:30 AM

## 2022-01-11 NOTE — Assessment & Plan Note (Addendum)
   Documented history of hypertension however patient is not on any outpatient antihypertensives  We will monitor blood pressures closely and titrate on oral antihypertensives as necessary

## 2022-01-11 NOTE — Assessment & Plan Note (Addendum)
   Replace  Additionally replacing concurrent hypomagnesemia

## 2022-01-11 NOTE — Plan of Care (Signed)
  Problem: Education: Goal: Knowledge of General Education information will improve Description Including pain rating scale, medication(s)/side effects and non-pharmacologic comfort measures Outcome: Progressing   Problem: Clinical Measurements: Goal: Ability to maintain clinical measurements within normal limits will improve Outcome: Progressing Goal: Respiratory complications will improve Outcome: Progressing Goal: Cardiovascular complication will be avoided Outcome: Progressing   Problem: Pain Managment: Goal: General experience of comfort will improve Outcome: Progressing   Problem: Safety: Goal: Ability to remain free from injury will improve Outcome: Progressing   Problem: Skin Integrity: Goal: Risk for impaired skin integrity will decrease Outcome: Progressing   

## 2022-01-11 NOTE — Progress Notes (Signed)
[name] presented for a nuclear stress test today.  No immediate complications.  Stress imaging is pending at this time.   Preliminary EKG findings may be listed in the chart, but the stress test result will not be finalized until perfusion imaging is complete.   [place], [place]  01/11/2022 11:22 AM

## 2022-01-11 NOTE — Progress Notes (Signed)
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  ? GI related    Assessment and Plan: * Chest pain Patient presenting with chest discomfort with typical and atypical features Troponin unremarkable thus far, continue to cycle cardiac enzymes CT angiogram reveals no evidence of pulmonary embolism or acute cardiopulmonary disease Possibly uncontrolled gastroesophageal reflux disease and esophageal spasm-we will try a trial of Maalox followed by scheduled PPI Stress test pending   Hypokalemia Replace Additionally replacing concurrent hypomagnesemia   Hypomagnesemia Replace aggressively   Chronic alcoholic liver disease ([place]) Patient complaining some degree of abdominal bloating and discomfort-- no sign of ascities Following with Dr. [name] with gastroenterology in the outpatient setting Status post recent EGD demonstrating evidence of portal hypertensive gastropathy with no evidence of varices Continue outpatient gastroenterology follow-up  Alcohol abuse Patient reports that he continues to be abstinent of alcohol Continue to encourage and counsel patient on cessation  Thrombocytopenia ([place]) Longstanding chronic substantial thrombocytopenia secondary to liver disease No clinical evidence of bleeding Monitoring platelet counts with serial CBCs  Benign essential hypertension Documented history of hypertension however patient is not on any outpatient antihypertensives We will monitor blood pressures closely and titrate on oral  antihypertensives as necessary  Alcoholic myopathy Patient is a walker for ambulation Fall precautions PT evaluation-home health          DVT prophylaxis: SCDs Start: 01/11/22 0035    Code Status: Full Code Family Communication:   Disposition Plan:  Level of care: Telemetry Cardiac Status is: Observation The patient remains OBS appropriate and will d/c before 2 midnights.    Consultants:  cards   Subjective: No further chest pain  Objective: Vitals:   01/11/22 1115 01/11/22 1117 01/11/22 1119 01/11/22 1304  BP: 133/86 126/68 126/72 126/84  Pulse: 93 93 92 74  Resp:    15  Temp:    98.1 F (36.7 C)  TempSrc:    Oral  SpO2:      Weight:      Height:        Intake/Output Summary (Last 24 hours) at 01/11/2022 1428 Last data filed at 01/11/2022 0923 Gross per 24 hour  Intake 240 ml  Output 650 ml  Net -410 ml   Filed Weights   01/10/22 1718 01/10/22 1745  Weight: 73.9 kg 73.9 kg    Examination:   General: Appearance:     Overweight male in no acute distress     Lungs:     respirations unlabored  Heart:    Normal heart rate.    MS:   All extremities are intact.    Neurologic:   Awake, alert       Data Reviewed: I have personally reviewed following labs and imaging studies  CBC: Recent Labs  Lab 01/10/22 1800 01/11/22 0438  WBC 4.1 3.8*  NEUTROABS  --  2.0  HGB 10.1* 10.1*  HCT 29.3* 29.0*  MCV 91.6 92.1  PLT 29* 25*   Basic Metabolic Panel: Recent Labs  Lab 01/10/22 1724 01/11/22 0438  NA 134* 134*  K 3.2* 3.0*  CL 101 101  CO2 22 23  GLUCOSE 88 86  BUN 7 7  CREATININE 0.62 0.73  CALCIUM 9.0 8.8*  MG  --  0.9*   GFR: Estimated Creatinine Clearance: 99.3 mL/min (by C-G formula based on SCr of 0.73 mg/dL). Liver Function Tests: Recent Labs  Lab 01/10/22 1735 01/11/22 0438  AST 102* 86*  ALT 21 20  ALKPHOS 83 78  BILITOT 3.2* 3.9*  PROT 7.4 6.5  ALBUMIN 3.2* 2.7*   No results for input(Juan): LIPASE, AMYLASE in  the last 168 hours. No results for input(Juan): AMMONIA in the last 168 hours. Coagulation Profile: No results for input(Juan): INR, PROTIME in the last 168 hours. Cardiac Enzymes: No results for input(Juan): CKTOTAL, CKMB, CKMBINDEX, TROPONINI in the last 168 hours. BNP (last 3 results) No results for input(Juan): PROBNP in the last 8760 hours. HbA1C: No results for input(Juan): HGBA1C in the last 72 hours. CBG: No results for input(Juan): GLUCAP in the last 168 hours. Lipid Profile: No results for input(Juan): CHOL, HDL, LDLCALC, TRIG, CHOLHDL, LDLDIRECT in the last 72 hours. Thyroid Function Tests: No results for input(Juan): TSH, T4TOTAL, FREET4, T3FREE, THYROIDAB in the last 72 hours. Anemia Panel: No results for input(Juan): VITAMINB12, FOLATE, FERRITIN, TIBC, IRON, RETICCTPCT in the last 72 hours. Sepsis Labs: No results for input(Juan): PROCALCITON, LATICACIDVEN in the last 168 hours.  No results found for this or any previous visit (from the past 240 hour(Juan)).       Radiology Studies: DG Chest 2 View  Result Date: 01/10/2022 CLINICAL DATA:  Chest pain with shortness of breath. EXAM: CHEST - 2 VIEW COMPARISON:  Chest x-ray 12/22/2021 FINDINGS: The heart size and mediastinal contours are within normal limits. Both lungs are clear. The visualized skeletal structures are unremarkable. IMPRESSION: No active cardiopulmonary disease. Electronically Signed   By: Ronney Asters M.D.   On: 01/10/2022 17:51   CT Angio Chest PE W and/or Wo Contrast  Result Date: 01/10/2022 CLINICAL DATA:  Central chest pain EXAM: CT ANGIOGRAPHY CHEST WITH CONTRAST TECHNIQUE: Multidetector CT imaging of the chest was performed using the standard protocol during bolus administration of intravenous contrast. Multiplanar CT image reconstructions and MIPs were obtained to evaluate the vascular anatomy. RADIATION DOSE REDUCTION: This exam was performed according to the departmental dose-optimization program which includes automated  exposure control, adjustment of the mA and/or kV according to patient size and/or use of iterative reconstruction technique. CONTRAST:  63m OMNIPAQUE IOHEXOL 350 MG/ML SOLN COMPARISON:  07/01/2019 CT chest FINDINGS: Cardiovascular: Satisfactory opacification of the pulmonary arteries to the segmental level. No evidence of pulmonary embolism. Normal heart size. Coronary artery calcifications. No reflux of contrast into the hepatic veins. No pericardial effusion. 4 vessel arch, with an aberrant origin of the right subclavian. Mediastinum/Nodes: No enlarged mediastinal, hilar, or axillary lymph nodes. Thyroid gland, trachea, and esophagus demonstrate no significant findings. Lungs/Pleura: Minimal dependent atelectasis. Lungs are otherwise clear. No pleural effusion or pneumothorax. Upper Abdomen: No acute abnormality. Musculoskeletal: No chest wall abnormality. No acute or significant osseous findings. Healed left anterior sixth rib fracture. Review of the MIP images confirms the above findings. IMPRESSION: Negative for pulmonary embolism.  No acute process in the chest. Electronically Signed   By: AMerilyn BabaM.D.   On: 01/10/2022 19:11   ECHOCARDIOGRAM COMPLETE  Result Date: 01/11/2022    ECHOCARDIOGRAM REPORT   Patient Name:   SJARAD BARTHDate of Exam: 01/11/2022 Medical Rec #:  [id number]      Height:       65.0 in Accession #:  [id number]      Weight:       163.0 lb Date of Birth:  [birth year]       BSA:          1.813 m Patient Age:    30 years        BP:           133/91 mmHg Patient Gender: M               HR:           84 bpm. Exam Location:  Inpatient Procedure: 2D Echo, Cardiac Doppler and Color Doppler Indications:    Aortic stenosis  History:        Patient has prior history of Echocardiogram examinations. Risk                 Factors:Hypertension.  Sonographer:    Jyl Heinz Referring Phys: [place]  1. Left ventricular ejection fraction, by estimation, is 60 to 65%.  The left ventricle has normal function. The left ventricle has no regional wall motion abnormalities. There is mild concentric left ventricular hypertrophy. Left ventricular diastolic parameters were normal.  2. Right ventricular systolic function is normal. The right ventricular size is normal. There is normal pulmonary artery systolic pressure. The estimated right ventricular systolic pressure is 94.8 mmHg.  3. The mitral valve is grossly normal. Trivial mitral valve regurgitation. Moderate mitral annular calcification.  4. The aortic valve is tricuspid. There is moderate calcification of the aortic valve. There is severe thickening of the aortic valve. Aortic valve regurgitation is mild. Mild aortic valve stenosis. Aortic valve mean gradient measures 14.0 mmHg. Aortic valve Vmax measures 2.53 m/Juan. DI 0.46.  5. The inferior vena cava is normal in size with greater than 50% respiratory variability, suggesting right atrial pressure of 3 mmHg. Comparison(Juan): Compared to prior TTE in 12/2020, the mean aortic valve gradient has increased. Previous mean was 3mHg. FINDINGS  Left Ventricle: Left ventricular ejection fraction, by estimation, is 60 to 65%. The left ventricle has normal function. The left ventricle has no regional wall motion abnormalities. The left ventricular internal cavity size was normal in size. There is  mild concentric left ventricular hypertrophy. Left ventricular diastolic parameters were normal. Right Ventricle: The right ventricular size is normal. No increase in right ventricular wall thickness. Right ventricular systolic function is normal. There is normal pulmonary artery systolic pressure. The tricuspid regurgitant velocity is 2.14 m/Juan, and  with an assumed right atrial pressure of 3 mmHg, the estimated right ventricular systolic pressure is 254.6mmHg. Left Atrium: Left atrial size was normal in size. Right Atrium: Right atrial size was normal in size. Pericardium: There is no evidence of  pericardial effusion. Mitral Valve: The mitral valve is grossly normal. There is mild thickening of the mitral valve leaflet(Juan). Moderate mitral annular calcification. Trivial mitral valve regurgitation. Tricuspid Valve: The tricuspid valve is normal in structure. Tricuspid valve regurgitation is trivial. Aortic Valve: DI 0.46. The aortic valve is tricuspid. There is moderate calcification of the aortic valve. There is severe thickening of the aortic valve. Aortic valve regurgitation is mild. Aortic regurgitation PHT measures 584 msec. Mild aortic stenosis is present. Aortic valve mean gradient measures 14.0 mmHg. Aortic valve peak gradient measures 25.6 mmHg. Aortic valve area, by VTI measures 1.55 cm. Pulmonic Valve: The pulmonic valve was normal in structure. Pulmonic valve regurgitation is trivial. Aorta: The aortic root and ascending aorta are structurally normal, with no evidence  of dilitation. Venous: The inferior vena cava is normal in size with greater than 50% respiratory variability, suggesting right atrial pressure of 3 mmHg. IAS/Shunts: The atrial septum is grossly normal.  LEFT VENTRICLE PLAX 2D LVIDd:         4.60 cm      Diastology LVIDs:         3.00 cm      LV e' medial:    9.32 cm/Juan LV PW:         1.20 cm      LV E/e' medial:  8.8 LV IVS:        1.00 cm      LV e' lateral:   11.00 cm/Juan LVOT diam:     2.00 cm      LV E/e' lateral: 7.5 LV SV:         73 LV SV Index:   40 LVOT Area:     3.14 cm  LV Volumes (MOD) LV vol d, MOD A2C: 108.0 ml LV vol d, MOD A4C: 111.0 ml LV vol Juan, MOD A2C: 38.1 ml LV vol Juan, MOD A4C: 40.1 ml LV SV MOD A2C:     69.9 ml LV SV MOD A4C:     111.0 ml LV SV MOD BP:      71.7 ml RIGHT VENTRICLE             IVC RV Basal diam:  3.10 cm     IVC diam: 1.40 cm RV Mid diam:    3.10 cm RV Juan prime:     17.90 cm/Juan TAPSE (M-mode): 2.6 cm LEFT ATRIUM             Index        RIGHT ATRIUM           Index LA diam:        3.60 cm 1.99 cm/m   RA Area:     15.20 cm LA Vol (A2C):   50.6 ml  27.90 ml/m  RA Volume:   40.50 ml  22.33 ml/m LA Vol (A4C):   54.3 ml 29.94 ml/m LA Biplane Vol: 54.4 ml 30.00 ml/m  AORTIC VALVE AV Area (Vmax):    1.47 cm AV Area (Vmean):   1.59 cm AV Area (VTI):     1.55 cm AV Vmax:           253.00 cm/Juan AV Vmean:          179.000 cm/Juan AV VTI:            0.469 m AV Peak Grad:      25.6 mmHg AV Mean Grad:      14.0 mmHg LVOT Vmax:         118.50 cm/Juan LVOT Vmean:        90.600 cm/Juan LVOT VTI:          0.231 m LVOT/AV VTI ratio: 0.49 AI PHT:            584 msec  AORTA Ao Root diam: 3.50 cm Ao Asc diam:  3.30 cm MITRAL VALVE               TRICUSPID VALVE MV Area (PHT): 4.60 cm    TR Peak grad:   18.3 mmHg MV Decel Time: 165 msec    TR Vmax:        214.00 cm/Juan MV E velocity: 82.00 cm/Juan MV A velocity: 60.60 cm/Juan  SHUNTS MV E/A ratio:  1.35  Systemic VTI:  0.23 m                            Systemic Diam: 2.00 cm Gwyndolyn Kaufman MD Electronically signed by Gwyndolyn Kaufman MD Signature Date/Time: 01/11/2022/11:49:10 AM    Final    [place] ASCITES (ABDOMEN LIMITED)  Result Date: 01/11/2022 CLINICAL DATA:  57 year old male with possible ascites. EXAM: LIMITED ABDOMEN ULTRASOUND FOR ASCITES TECHNIQUE: Limited ultrasound survey for ascites was performed in all four abdominal quadrants. COMPARISON:  None Available. FINDINGS: There is no evidence of ascites. IMPRESSION: No evidence of ascites. Electronically Signed   By: Margarette Canada M.D.   On: 01/11/2022 12:57        Scheduled Meds:  aspirin  325 mg Oral Daily   busPIRone  15 mg Oral BID   [START ON 01/12/2022] pantoprazole  40 mg Oral Daily   Continuous Infusions:  sodium chloride 10 mL/hr at 01/11/22 0621     LOS: 0 days    Time spent: 45 minutes spent on chart review, discussion with nursing staff, consultants, updating family and interview/physical exam; more than 50% of that time was spent in counseling and/or coordination of care.    Geradine Girt, DO [facility] Available via Epic secure  chat 7am-7pm After these hours, please refer to coverage provider listed on [email or web address] 01/11/2022, 2:28 PM

## 2022-01-11 NOTE — Progress Notes (Signed)
Initial Nutrition Assessment  DOCUMENTATION CODES:  Non-severe (moderate) malnutrition in context of chronic illness  INTERVENTION: Liberalize diet to 2g sodium to allow for more dining choices Ensure Enlive po TID, each supplement provides 350 kcal and 20 grams of protein. MVI with minerals daily  NUTRITION DIAGNOSIS:  Moderate Malnutrition related to chronic illness (cirrhosis) as evidenced by mild muscle depletion, mild fat depletion, energy intake < or equal to 75% for > or equal to 1 month.  GOAL:  Patient will meet greater than or equal to 90% of their needs  MONITOR:  PO intake, Supplement acceptance, Labs, Weight trends  REASON FOR ASSESSMENT:  Malnutrition Screening Tool    ASSESSMENT:  Pt with hx of HTN, PTSD, GERD, hx EtOH abuse (currently sober per pt), and cirrhosis presented to ED with chest pain accompanied by nausea.  Pt resting in bed at the time of assessment. Reports that appetite is fair. Discussed baseline intake and pt reports that his appetite has been poor for quite awhile and he has lost weight. States that he is drinking excessive amounts of water and gatorade during the day (when asked to quantify he states he drinks bottle after bottle) and that he does not feel hungry because of this.  On a normal day, pt will drink two premier protein drinks, a banana, and possible a frozen meal (but not always). Discussed the low energy value of premier protein and suggested switching the supplement out for a high kcal option. Open to trying ensure while admitted.  Nutritionally Relevant Medications: Scheduled Meds:  potassium chloride  40 mEq Oral Q4H   PRN Meds: ondansetron, polyethylene glycol  Labs Reviewed: Sodium 134 K 3.0 Mg .9  NUTRITION - FOCUSED PHYSICAL EXAM: Flowsheet Row Most Recent Value  Orbital Region No depletion  Upper Arm Region Mild depletion  Thoracic and Lumbar Region Mild depletion  Buccal Region No depletion  Temple Region No  depletion  Clavicle Bone Region Mild depletion  Clavicle and Acromion Bone Region No depletion  Scapular Bone Region No depletion  Dorsal Hand Mild depletion  Patellar Region Mild depletion  Anterior Thigh Region Mild depletion  Posterior Calf Region Mild depletion  Edema (RD Assessment) None  Hair Reviewed  Eyes Reviewed  Mouth Reviewed  Skin Reviewed  Nails Reviewed   Diet Order:   Diet Order             Diet Heart Room service appropriate? Yes; Fluid consistency: Thin  Diet effective now                   EDUCATION NEEDS:  Education needs have been addressed  Skin:  Skin Assessment: Reviewed RN Assessment  Last BM:  5/21  Height:  Ht Readings from Last 1 Encounters:  01/10/22 5' 5"  (1.651 m)    Weight:  Wt Readings from Last 1 Encounters:  01/10/22 73.9 kg    Ideal Body Weight:  61.8 kg  BMI:  Body mass index is 27.12 kg/m.  Estimated Nutritional Needs:  Kcal:  1900-2100 kcal/d Protein:  95-110 g/d Fluid:  2L/d    Ranell Patrick, RD, LDN Clinical Dietitian RD pager # available in [place]  After hours/weekend pager # available in [facility]

## 2022-01-11 NOTE — Progress Notes (Signed)
Critical Mg 0.9 reported to Dr Cyd Silence who is on floor seeing patient. Awaiting orders. Jessie Foot, RN

## 2022-01-11 NOTE — Assessment & Plan Note (Addendum)
   Replace aggressively
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
## 2022-01-11 NOTE — Assessment & Plan Note (Addendum)
   Patient is a walker for ambulation  Fall precautions  PT evaluation-home health

## 2022-01-12 ENCOUNTER — Other Ambulatory Visit: Payer: Self-pay | Admitting: Family Medicine

## 2022-01-12 ENCOUNTER — Other Ambulatory Visit (HOSPITAL_BASED_OUTPATIENT_CLINIC_OR_DEPARTMENT_OTHER): Payer: Self-pay

## 2022-01-12 DIAGNOSIS — E440 Moderate protein-calorie malnutrition: Secondary | ICD-10-CM | POA: Diagnosis not present

## 2022-01-12 DIAGNOSIS — R0789 Other chest pain: Secondary | ICD-10-CM | POA: Diagnosis not present

## 2022-01-12 DIAGNOSIS — K7030 Alcoholic cirrhosis of liver without ascites: Secondary | ICD-10-CM | POA: Diagnosis not present

## 2022-01-12 DIAGNOSIS — R079 Chest pain, unspecified: Secondary | ICD-10-CM | POA: Diagnosis not present

## 2022-01-12 DIAGNOSIS — Z91010 Allergy to peanuts: Secondary | ICD-10-CM | POA: Diagnosis not present

## 2022-01-12 DIAGNOSIS — I200 Unstable angina: Secondary | ICD-10-CM | POA: Diagnosis not present

## 2022-01-12 DIAGNOSIS — K709 Alcoholic liver disease, unspecified: Secondary | ICD-10-CM | POA: Diagnosis not present

## 2022-01-12 DIAGNOSIS — D696 Thrombocytopenia, unspecified: Secondary | ICD-10-CM | POA: Diagnosis not present

## 2022-01-12 DIAGNOSIS — E876 Hypokalemia: Secondary | ICD-10-CM | POA: Diagnosis not present

## 2022-01-12 DIAGNOSIS — I10 Essential (primary) hypertension: Secondary | ICD-10-CM | POA: Diagnosis not present

## 2022-01-12 LAB — CBC
HCT: 30.3 % — ABNORMAL LOW (ref 39.0–52.0)
Hemoglobin: 10.1 g/dL — ABNORMAL LOW (ref 13.0–17.0)
MCH: 31.6 pg (ref 26.0–34.0)
MCHC: 33.3 g/dL (ref 30.0–36.0)
MCV: 94.7 fL (ref 80.0–100.0)
Platelets: 31 10*3/uL — ABNORMAL LOW (ref 150–400)
RBC: 3.2 MIL/uL — ABNORMAL LOW (ref 4.22–5.81)
RDW: 15.8 % — ABNORMAL HIGH (ref 11.5–15.5)
WBC: 5.4 10*3/uL (ref 4.0–10.5)
nRBC: 0 % (ref 0.0–0.2)

## 2022-01-12 LAB — BASIC METABOLIC PANEL
Anion gap: 6 (ref 5–15)
BUN: 12 mg/dL (ref 6–20)
CO2: 23 mmol/L (ref 22–32)
Calcium: 8.7 mg/dL — ABNORMAL LOW (ref 8.9–10.3)
Chloride: 104 mmol/L (ref 98–111)
Creatinine, Ser: 0.83 mg/dL (ref 0.61–1.24)
GFR, Estimated: >60 mL/min (ref >60)
Glucose, Bld: 104 mg/dL — ABNORMAL HIGH (ref 70–99)
Potassium: 3.8 mmol/L (ref 3.5–5.1)
Sodium: 133 mmol/L — ABNORMAL LOW (ref 135–145)

## 2022-01-12 LAB — MAGNESIUM: Magnesium: 1.6 mg/dL — ABNORMAL LOW (ref 1.7–2.4)

## 2022-01-12 MED ORDER — MAGNESIUM SULFATE 2 GM/50ML IV SOLN
2.0000 g | Freq: Once | INTRAVENOUS | Status: AC
  Administered 2022-01-12: 2 g via INTRAVENOUS
  Filled 2022-01-12: qty 50
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
## 2022-01-12 NOTE — TOC Initial Note (Signed)
Transition of Care [facility]) - Initial/Assessment Note    Patient Details  Name: Juan [name] MRN: [id number] Date of Birth: [birth year]  [facility] CM/SW Contact:    Bethena Roys, RN Phone Number: 01/12/2022, 10:10 AM  Clinical Narrative:  Case Manager spoke with the patient regarding disposition needs. PTA patient was from home with spouse and the plan is to return home with home health services. Patient states he has a VA benefit secondary that he would like to use. [employer] Therapist, music) is in network with the [place] and they can service the patient. Start of care to begin within 24-48 hours post transition home. Spouse to transport patient home via private vehicle.             Expected Discharge Plan: [place] Barriers to Discharge: No Barriers Identified   Patient Goals and CMS Choice Patient states their goals for this hospitalization and ongoing recovery are:: Patient wants to return home   Choice offered to / list presented to : Patient (Patient did not have a preference and is willing to use VA Benefit.)  Expected Discharge Plan and Services Expected Discharge Plan: [place]   Discharge Planning Services: CM Consult Post Acute Care Choice: [place] arrangements for the past 2 months: Single Family Home Expected Discharge Date: 01/12/22               DME Arranged: N/A         HH Arranged: PT, OT HH Agency: [place] Date HH Agency Contacted: 01/12/22 Time HH Agency Contacted: 0900 Representative spoke with at [place]: Levada Dy  Prior Living Arrangements/Services Living arrangements for the past 2 months: [place] with:: Spouse Patient language and need for interpreter reviewed:: Yes Do you feel safe going back to the place where you live?: Yes      Need for Family Participation in Patient Care: Yes (Comment) Care giver support system in place?: Yes (comment)    Criminal Activity/Legal Involvement Pertinent to Current Situation/Hospitalization: No - Comment as needed  Activities of Daily Living Home Assistive Devices/Equipment: Blood pressure cuff, Shower chair with back, Cane (specify quad or straight), Contact lenses, Wheelchair, Grab bars in shower, Grab bars around toilet, Hand-held shower hose, Long-handled sponge, Long-handled shoehorn, Raised toilet seat with rails, Reacher, Scales, Walker (specify type), Sock aid (quad can, rollater) ADL Screening (condition at time of admission) Patient's cognitive ability adequate to safely complete daily activities?: Yes Is the patient deaf or have difficulty hearing?: No Does the patient have difficulty seeing, even when wearing glasses/contacts?: Yes Does the patient have difficulty concentrating, remembering, or making decisions?: Yes Patient able to express need for assistance with ADLs?: Yes Does the patient have difficulty dressing or bathing?: Yes Independently performs ADLs?: No Communication: Independent Dressing (OT): Needs assistance Is this a change from baseline?: Pre-admission baseline Grooming: Independent Feeding: Independent Bathing: Needs assistance Is this a change from baseline?: Pre-admission baseline Toileting: Independent with device (comment) In/Out Bed: Needs assistance Is this a change from baseline?: Pre-admission baseline Walks in Home: Independent with device (comment) Does the patient have difficulty walking or climbing stairs?: Yes Weakness of Legs: Both Weakness of Arms/Hands: Both  Permission Sought/Granted Permission sought to share information with : Facility Sport and exercise psychologist, Family Supports, Case Optician, dispensing granted to share information with : Yes, Verbal Permission Granted     Permission granted to share info w AGENCY: [facility]  Emotional Assessment Appearance:: Appears stated age Attitude/Demeanor/Rapport: Engaged Affect  (typically observed): Appropriate Orientation: : Oriented to Self, Oriented to Place, Oriented to Situation, Oriented to  Time Alcohol / Substance Use: Not Applicable Psych Involvement: No (comment)  Admission diagnosis:  Unstable angina ([place]) [I20.0] Chest pain [R07.9] Patient Active Problem List   Diagnosis Date Noted   Alcohol abuse 01/11/2022   Hypokalemia 01/11/2022   Hypomagnesemia 01/11/2022   Malnutrition of moderate degree 01/11/2022   Nonrheumatic aortic valve stenosis    Chest pain 01/10/2022   Shoulder pain, left, posterior 12/22/2021   Portal hypertension (HCC)    Nausea and vomiting    Hematochezia    Pancytopenia ([place]) 09/18/2021   Tinea pedis 05/12/2021   Diverticulosis 04/20/2021   Right lower quadrant pain 04/17/2021   Tibialis posterior tendinitis, right 04/14/2021   Relationship problems 03/23/2021   Dizziness 03/23/2021   Urinary frequency 03/23/2021   Right ankle sprain 02/12/2021   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[place]) 32/76/1470   Systolic murmur 92/95/7473   Hypocalcemia 11/02/2020   Thrombocytopenia ([place]) 11/02/2020   COVID-19 virus infection 08/13/2020   Primary osteoarthritis of first carpometacarpal joint of one hand, left 07/11/2020   Allergic reaction 07/08/2020   History of alcohol abuse 05/20/2020   Impingement syndrome, shoulder, right 05/20/2020   Fracture of laryngeal cartilage ([place]) 01/17/2020   Esophagitis, [place] grade D 12/05/2019   Chronic alcoholic liver disease ([place]) 09/30/2019   GAD (generalized anxiety disorder) 04/16/2019   Chronic post-traumatic stress disorder (PTSD) 04/16/2019   MDD (major depressive disorder), recurrent severe, without psychosis ([place]) 04/15/2019   History of bilateral inguinal hernia repair 40/37/0964   Alcoholic myopathy 38/38/1840   Laceration of extensor hallucis longus tendon, left, initial encounter 02/06/2017   Elevated liver enzymes 01/07/2017   Chronic fatigue 12/21/2016   Benign essential  hypertension 12/21/2016   PCP:  [name], DO Pharmacy:   [facility] [address] [place] [address] Phone: [phone] Fax: [facility] #[id number] - [place], [address] [place] [address] Phone: [phone] Fax: [place], [place] - [place] [address] [place] [address] Phone: [phone] Fax: [phone]  Readmission Risk Interventions     View : No data to display.

## 2022-01-12 NOTE — Discharge Summary (Signed)
Physician Discharge Summary   Patient: Juan [name] MRN: [id number] DOB: [birth year]  Admit date:     01/10/2022  Discharge date: 01/12/22  Discharge Physician: Geradine Girt   PCP: [name], DO   Recommendations at discharge:    BMP/Mg in 2 weeks  Discharge Diagnoses: Principal Problem:   Chest pain Active Problems:   Hypokalemia   Hypomagnesemia   Chronic alcoholic liver disease (HCC)   Alcohol abuse   Thrombocytopenia ([place])   Benign essential hypertension   Alcoholic myopathy   Malnutrition of moderate degree   Hospital Course: 56 year old male with past medical history of anxiety disorder, PTSD, alcohol abuse (now abstinent), alcoholic myopathy, hypertension, alcoholic cirrhosis (based on RUQ [place] 01/5680) complicated by 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  ? GI related  Assessment and Plan: * Chest pain Patient presenting with chest discomfort with typical and atypical features Troponin unremarkable thus far, continue to cycle cardiac enzymes CT angiogram reveals no evidence of pulmonary embolism or acute cardiopulmonary disease Possibly uncontrolled gastroesophageal reflux disease and esophageal spasm-we will try a trial of Maalox followed by scheduled PPI Stress test normal per cards   Hypokalemia Replace   Hypomagnesemia Replace   Chronic alcoholic liver disease ([place]) Patient complaining some degree of abdominal bloating and discomfort-- no sign of ascities Following with Dr. [name] with gastroenterology in the outpatient setting Status post recent EGD demonstrating evidence of portal hypertensive gastropathy with no evidence of varices Continue outpatient gastroenterology follow-up  Alcohol abuse Patient reports that he continues to be abstinent of alcohol Continue to encourage and counsel patient on  cessation  Thrombocytopenia ([place]) Longstanding chronic substantial thrombocytopenia secondary to liver disease No clinical evidence of bleeding Monitoring platelet counts with serial CBCs  Benign essential hypertension Documented history of hypertension however patient is not on any outpatient antihypertensives We will monitor blood pressures closely and titrate on oral antihypertensives as necessary  Alcoholic myopathy Patient has a walker for ambulation Fall precautions PT evaluation-home health  Nutrition Status: Nutrition Problem: Moderate Malnutrition Etiology: chronic illness (cirrhosis) Signs/Symptoms: mild muscle depletion, mild fat depletion, energy intake < or equal to 75% for > or equal to 1 month Interventions: Refer to RD note for recommendations     Consultants: cards  Disposition: Home Diet recommendation:  Discharge Diet Orders (From admission, onward)     Start     Ordered   01/12/22 0000  Diet - low sodium heart healthy        01/12/22 0806           DISCHARGE MEDICATION: Allergies as of 01/12/2022       Reactions   Charentais Melon (french Melon) Anaphylaxis   Cucumber Extract Itching, Nausea And Vomiting   No extracts; just cucumber   Peanut Butter Flavor Anaphylaxis, Swelling   Peanut Oil Swelling   Shellfish Allergy Itching, Swelling   Cantaloupe Extract Allergy Skin Test Rash   Lactose Other (See Comments)   Lactose intolerant - causes indigestion   Strawberry Extract Nausea And Vomiting, Swelling   Vancomycin Rash   Apple Juice Swelling   Codeine Itching   itching   Depakote Er [divalproex Sodium Er] Swelling   Tongue swelling        Medication List     STOP taking these medications    predniSONE 50 MG tablet Commonly known as: DELTASONE       TAKE these medications  acetaminophen 650 MG CR tablet Commonly known as: TYLENOL Take 1,300 mg by mouth every 8 (eight) hours as needed for pain.   aspirin 81 MG chewable  tablet Chew 81 mg by mouth every morning.   B COMPLEX PO Take 1 tablet by mouth every morning.   busPIRone 15 MG tablet Commonly known as: BUSPAR Take 15 mg by mouth 2 (two) times daily.   Cholecalciferol 50 MCG (2000 UT) Caps Take 2,000 Units by mouth every morning.   cyclobenzaprine 10 MG tablet Commonly known as: FLEXERIL Take 1/2 to 1 tablet by mouth at bedtime, then increase gradually to 1 tablet 3 times a day What changed:  how much to take how to take this when to take this reasons to take this additional instructions   EPINEPHrine 0.3 mg/0.3 mL Soaj injection Commonly known as: EPI-PEN INJECT 0.3 MG INTO THE MUSCLE AS NEEDED FOR ANAPHYLAXIS. What changed:  how much to take how to take this when to take this reasons to take this   folic acid 1 MG tablet Commonly known as: FOLVITE TAKE 1 TABLET (1 MG TOTAL) BY MOUTH DAILY. What changed:  how much to take when to take this   Magnesium Chloride 64 MG Tabs Take 2 tablets by mouth in the morning and at bedtime. What changed: how much to take   ondansetron 8 MG disintegrating tablet Commonly known as: ZOFRAN-ODT Dissolve 1 tablet (8 mg total) by mouth every 8 (eight) hours as needed for nausea.   pantoprazole 40 MG tablet Commonly known as: PROTONIX Take 1 tablet (40 mg total) by mouth daily.   potassium chloride SA 20 MEQ tablet Commonly known as: KLOR-CON M Take 2 tablets (40 mEq total) by mouth 2 (two) times daily. What changed:  how much to take when to take this   prazosin 2 MG capsule Commonly known as: MINIPRESS Take 4 mg by mouth at bedtime.   saccharomyces boulardii 250 MG capsule Commonly known as: FLORASTOR Take 250 mg by mouth 2 (two) times daily.   thiamine 100 MG tablet Take 100 mg by mouth daily.   Trintellix 10 MG Tabs tablet Generic drug: vortioxetine HBr Take 1 tablet (10 mg total) by mouth daily.   Your Life Multi Adult Gummies Chew Chew 1 tablet by mouth daily.         Discharge Exam: Filed Weights   01/10/22 1718 01/10/22 1745  Weight: 73.9 kg 73.9 kg     Condition at discharge: good  The results of significant diagnostics from this hospitalization (including imaging, microbiology, ancillary and laboratory) are listed below for reference.   Imaging Studies: DG Chest 2 View  Result Date: 01/10/2022 CLINICAL DATA:  Chest pain with shortness of breath. EXAM: CHEST - 2 VIEW COMPARISON:  Chest x-ray 12/22/2021 FINDINGS: The heart size and mediastinal contours are within normal limits. Both lungs are clear. The visualized skeletal structures are unremarkable. IMPRESSION: No active cardiopulmonary disease. Electronically Signed   By: Ronney Asters M.D.   On: 01/10/2022 17:51   DG Ribs Unilateral W/Chest Left  Result Date: 12/22/2021 CLINICAL DATA:  Left rib pain. EXAM: LEFT RIBS AND CHEST - 3+ VIEW COMPARISON:  Chest radiograph dated 09/18/2021. FINDINGS: Lungs are clear. There is no pleural effusion or pneumothorax. The cardiac silhouette is within limits. No acute osseous pathology. No displaced rib fractures. IMPRESSION: Negative. Electronically Signed   By: Anner Crete M.D.   On: 12/22/2021 17:32   DG Cervical Spine Complete  Result Date: 12/22/2021 CLINICAL  DATA:  Left neck and scapular pain related to a fall. EXAM: CERVICAL SPINE - COMPLETE 4+ VIEW COMPARISON:  X-ray cervical 05/20/2020; MR cervical 06/27/2018. FINDINGS: Tiny triangular fragment adjacent to the anterior superior endplate of C5 appears chronic. No acute fracture or subluxation. The bones are osteopenic. Mild degenerative changes at C5-C6. The visualized posterior elements and odontoid appear intact. There is anatomic alignment of the lateral masses of C1 and C2. The soft tissues are unremarkable. IMPRESSION: No acute/traumatic cervical spine pathology. Electronically Signed   By: Anner Crete M.D.   On: 12/22/2021 17:31   CT Angio Chest PE W and/or Wo Contrast  Result Date:  01/10/2022 CLINICAL DATA:  Central chest pain EXAM: CT ANGIOGRAPHY CHEST WITH CONTRAST TECHNIQUE: Multidetector CT imaging of the chest was performed using the standard protocol during bolus administration of intravenous contrast. Multiplanar CT image reconstructions and MIPs were obtained to evaluate the vascular anatomy. RADIATION DOSE REDUCTION: This exam was performed according to the departmental dose-optimization program which includes automated exposure control, adjustment of the mA and/or kV according to patient size and/or use of iterative reconstruction technique. CONTRAST:  73m OMNIPAQUE IOHEXOL 350 MG/ML SOLN COMPARISON:  07/01/2019 CT chest FINDINGS: Cardiovascular: Satisfactory opacification of the pulmonary arteries to the segmental level. No evidence of pulmonary embolism. Normal heart size. Coronary artery calcifications. No reflux of contrast into the hepatic veins. No pericardial effusion. 4 vessel arch, with an aberrant origin of the right subclavian. Mediastinum/Nodes: No enlarged mediastinal, hilar, or axillary lymph nodes. Thyroid gland, trachea, and esophagus demonstrate no significant findings. Lungs/Pleura: Minimal dependent atelectasis. Lungs are otherwise clear. No pleural effusion or pneumothorax. Upper Abdomen: No acute abnormality. Musculoskeletal: No chest wall abnormality. No acute or significant osseous findings. Healed left anterior sixth rib fracture. Review of the MIP images confirms the above findings. IMPRESSION: Negative for pulmonary embolism.  No acute process in the chest. Electronically Signed   By: AMerilyn BabaM.D.   On: 01/10/2022 19:11   NM Myocar Multi W/Spect W/Wall Motion / EF  Result Date: 01/11/2022 CLINICAL DATA:  Chest pain EXAM: MYOCARDIAL IMAGING WITH SPECT (REST AND PHARMACOLOGIC-STRESS) GATED LEFT VENTRICULAR WALL MOTION STUDY LEFT VENTRICULAR EJECTION FRACTION TECHNIQUE: Standard myocardial SPECT imaging was performed after resting intravenous  injection of 10 mCi Tc-960metrofosmin. Subsequently, intravenous infusion of Lexiscan was performed under the supervision of the Cardiology staff. At peak effect of the drug, 31 mCi Tc-9942mtrofosmin was injected intravenously and standard myocardial SPECT imaging was performed. Quantitative gated imaging was also performed to evaluate left ventricular wall motion, and estimate left ventricular ejection fraction. COMPARISON:  None available FINDINGS: Perfusion: No decreased activity in the left ventricle on stress imaging to suggest reversible ischemia or infarction. Wall Motion: Normal left ventricular wall motion. No left ventricular dilation. Left Ventricular Ejection Fraction: 70 % End diastolic volume [id number] End systolic volume 32 ml IMPRESSION: 1. No reversible ischemia or infarction. 2. Normal left ventricular wall motion. 3. Left ventricular ejection fraction 70% 4. Non invasive risk stratification*: Low *2012 Appropriate Use Criteria for Coronary Revascularization Focused Update: J Am Coll Cardiol. 2018366;29(4):765-465ttp://[email or web address] Electronically Signed   By: FarMiachel RouxD.   On: 01/11/2022 15:56   ECHOCARDIOGRAM COMPLETE  Result Date: 01/11/2022    ECHOCARDIOGRAM REPORT   Patient Name:   Juan SIEFERTte of Exam: 01/11/2022 Medical Rec #:  [id number]    Height:       65.0 in Accession #:    [id number]  Weight:       163.0 lb Date of Birth:  07/10/1966       BSA:          1.813 m Patient Age:    56 years        BP:           133/91 mmHg Patient Gender: M               HR:           84 bpm. Exam Location:  Inpatient Procedure: 2D Echo, Cardiac Doppler and Color Doppler Indications:    Aortic stenosis  History:        Patient has prior history of Echocardiogram examinations. Risk                 Factors:Hypertension.  Sonographer:    Jyl Heinz Referring Phys: [place]  1. Left ventricular ejection fraction, by  estimation, is 60 to 65%. The left ventricle has normal function. The left ventricle has no regional wall motion abnormalities. There is mild concentric left ventricular hypertrophy. Left ventricular diastolic parameters were normal.  2. Right ventricular systolic function is normal. The right ventricular size is normal. There is normal pulmonary artery systolic pressure. The estimated right ventricular systolic pressure is 65.7 mmHg.  3. The mitral valve is grossly normal. Trivial mitral valve regurgitation. Moderate mitral annular calcification.  4. The aortic valve is tricuspid. There is moderate calcification of the aortic valve. There is severe thickening of the aortic valve. Aortic valve regurgitation is mild. Mild aortic valve stenosis. Aortic valve mean gradient measures 14.0 mmHg. Aortic valve Vmax measures 2.53 m/s. DI 0.46.  5. The inferior vena cava is normal in size with greater than 50% respiratory variability, suggesting right atrial pressure of 3 mmHg. Comparison(s): Compared to prior TTE in 12/2020, the mean aortic valve gradient has increased. Previous mean was 75mHg. FINDINGS  Left Ventricle: Left ventricular ejection fraction, by estimation, is 60 to 65%. The left ventricle has normal function. The left ventricle has no regional wall motion abnormalities. The left ventricular internal cavity size was normal in size. There is  mild concentric left ventricular hypertrophy. Left ventricular diastolic parameters were normal. Right Ventricle: The right ventricular size is normal. No increase in right ventricular wall thickness. Right ventricular systolic function is normal. There is normal pulmonary artery systolic pressure. The tricuspid regurgitant velocity is 2.14 m/s, and  with an assumed right atrial pressure of 3 mmHg, the estimated right ventricular systolic pressure is 290.3mmHg. Left Atrium: Left atrial size was normal in size. Right Atrium: Right atrial size was normal in size. Pericardium:  There is no evidence of pericardial effusion. Mitral Valve: The mitral valve is grossly normal. There is mild thickening of the mitral valve leaflet(s). Moderate mitral annular calcification. Trivial mitral valve regurgitation. Tricuspid Valve: The tricuspid valve is normal in structure. Tricuspid valve regurgitation is trivial. Aortic Valve: DI 0.46. The aortic valve is tricuspid. There is moderate calcification of the aortic valve. There is severe thickening of the aortic valve. Aortic valve regurgitation is mild. Aortic regurgitation PHT measures 584 msec. Mild aortic stenosis is present. Aortic valve mean gradient measures 14.0 mmHg. Aortic valve peak gradient measures 25.6 mmHg. Aortic valve area, by VTI measures 1.55 cm. Pulmonic Valve: The pulmonic valve was normal in structure. Pulmonic valve regurgitation is trivial. Aorta: The aortic root and ascending aorta are structurally normal, with no evidence of dilitation. Venous: The inferior vena  cava is normal in size with greater than 50% respiratory variability, suggesting right atrial pressure of 3 mmHg. IAS/Shunts: The atrial septum is grossly normal.  LEFT VENTRICLE PLAX 2D LVIDd:         4.60 cm      Diastology LVIDs:         3.00 cm      LV e' medial:    9.32 cm/s LV PW:         1.20 cm      LV E/e' medial:  8.8 LV IVS:        1.00 cm      LV e' lateral:   11.00 cm/s LVOT diam:     2.00 cm      LV E/e' lateral: 7.5 LV SV:         73 LV SV Index:   40 LVOT Area:     3.14 cm  LV Volumes (MOD) LV vol d, MOD A2C: 108.0 ml LV vol d, MOD A4C: 111.0 ml LV vol s, MOD A2C: 38.1 ml LV vol s, MOD A4C: 40.1 ml LV SV MOD A2C:     69.9 ml LV SV MOD A4C:     111.0 ml LV SV MOD BP:      71.7 ml RIGHT VENTRICLE             IVC RV Basal diam:  3.10 cm     IVC diam: 1.40 cm RV Mid diam:    3.10 cm RV S prime:     17.90 cm/s TAPSE (M-mode): 2.6 cm LEFT ATRIUM             Index        RIGHT ATRIUM           Index LA diam:        3.60 cm 1.99 cm/m   RA Area:     15.20 cm  LA Vol (A2C):   50.6 ml 27.90 ml/m  RA Volume:   40.50 ml  22.33 ml/m LA Vol (A4C):   54.3 ml 29.94 ml/m LA Biplane Vol: 54.4 ml 30.00 ml/m  AORTIC VALVE AV Area (Vmax):    1.47 cm AV Area (Vmean):   1.59 cm AV Area (VTI):     1.55 cm AV Vmax:           253.00 cm/s AV Vmean:          179.000 cm/s AV VTI:            0.469 m AV Peak Grad:      25.6 mmHg AV Mean Grad:      14.0 mmHg LVOT Vmax:         118.50 cm/s LVOT Vmean:        90.600 cm/s LVOT VTI:          0.231 m LVOT/AV VTI ratio: 0.49 AI PHT:            584 msec  AORTA Ao Root diam: 3.50 cm Ao Asc diam:  3.30 cm MITRAL VALVE               TRICUSPID VALVE MV Area (PHT): 4.60 cm    TR Peak grad:   18.3 mmHg MV Decel Time: 165 msec    TR Vmax:        214.00 cm/s MV E velocity: 82.00 cm/s MV A velocity: 60.60 cm/s  SHUNTS MV E/A ratio:  1.35        Systemic VTI:  0.23 m                            Systemic Diam: 2.00 cm Gwyndolyn Kaufman MD Electronically signed by Gwyndolyn Kaufman MD Signature Date/Time: 01/11/2022/11:49:10 AM    Final    [place] ASCITES (ABDOMEN LIMITED)  Result Date: 01/11/2022 CLINICAL DATA:  56 year old male with possible ascites. EXAM: LIMITED ABDOMEN ULTRASOUND FOR ASCITES TECHNIQUE: Limited ultrasound survey for ascites was performed in all four abdominal quadrants. COMPARISON:  None Available. FINDINGS: There is no evidence of ascites. IMPRESSION: No evidence of ascites. Electronically Signed   By: Margarette Canada M.D.   On: 01/11/2022 12:57    Microbiology: Results for orders placed or performed during the hospital encounter of 09/18/21  Resp Panel by RT-PCR (Flu A&B, Covid) Nasopharyngeal Swab     Status: None   Collection Time: 09/18/21 12:57 AM   Specimen: Nasopharyngeal Swab; Nasopharyngeal(NP) swabs in vial transport medium  Result Value Ref Range Status   SARS Coronavirus 2 by RT PCR NEGATIVE NEGATIVE Final    Comment: (NOTE) SARS-CoV-2 target nucleic acids are NOT DETECTED.  The SARS-CoV-2 RNA is generally detectable  in upper respiratory specimens during the acute phase of infection. The lowest concentration of SARS-CoV-2 viral copies this assay can detect is 138 copies/mL. A negative result does not preclude SARS-Cov-2 infection and should not be used as the sole basis for treatment or other patient management decisions. A negative result may occur with  improper specimen collection/handling, submission of specimen other than nasopharyngeal swab, presence of viral mutation(s) within the areas targeted by this assay, and inadequate number of viral copies(<138 copies/mL). A negative result must be combined with clinical observations, patient history, and epidemiological information. The expected result is Negative.  Fact Sheet for Patients:  EntrepreneurPulse.com.au  Fact Sheet for Healthcare Providers:  IncredibleEmployment.be  This test is no t yet approved or cleared by the [place] FDA and  has been authorized for detection and/or diagnosis of SARS-CoV-2 by FDA under an Emergency Use Authorization (EUA). This EUA will remain  in effect (meaning this test can be used) for the duration of the COVID-19 declaration under Section 564(b)(1) of the Act, 21 U.S.C.section 360bbb-3(b)(1), unless the authorization is terminated  or revoked sooner.       Influenza A by PCR NEGATIVE NEGATIVE Final   Influenza B by PCR NEGATIVE NEGATIVE Final    Comment: (NOTE) The Xpert Xpress SARS-CoV-2/FLU/RSV plus assay is intended as an aid in the diagnosis of influenza from Nasopharyngeal swab specimens and should not be used as a sole basis for treatment. Nasal washings and aspirates are unacceptable for Xpert Xpress SARS-CoV-2/FLU/RSV testing.  Fact Sheet for Patients: EntrepreneurPulse.com.au  Fact Sheet for Healthcare Providers: IncredibleEmployment.be  This test is not yet approved or cleared by the [place] FDA and has  been authorized for detection and/or diagnosis of SARS-CoV-2 by FDA under an Emergency Use Authorization (EUA). This EUA will remain in effect (meaning this test can be used) for the duration of the COVID-19 declaration under Section 564(b)(1) of the Act, 21 U.S.C. section 360bbb-3(b)(1), unless the authorization is terminated or revoked.  Performed at [facility], [address] [place], [place] [address]     Labs: CBC: Recent Labs  Lab 01/10/22 1800 01/11/22 0438 01/12/22 0334  WBC 4.1 3.8* 5.4  NEUTROABS  --  2.0  --   HGB 10.1* 10.1* 10.1*  HCT 29.3* 29.0* 30.3*  MCV 91.6 92.1 94.7  PLT 29* 25* 31*   Basic Metabolic Panel: Recent Labs  Lab 01/10/22 1724 01/11/22 0438 01/12/22 0334  NA 134* 134* 133*  K 3.2* 3.0* 3.8  CL 101 101 104  CO2 22 23 23   GLUCOSE 88 86 104*  BUN 7 7 12   CREATININE 0.62 0.73 0.83  CALCIUM 9.0 8.8* 8.7*  MG  --  0.9* 1.6*   Liver Function Tests: Recent Labs  Lab 01/10/22 1735 01/11/22 0438  AST 102* 86*  ALT 21 20  ALKPHOS 83 78  BILITOT 3.2* 3.9*  PROT 7.4 6.5  ALBUMIN 3.2* 2.7*   CBG: No results for input(s): GLUCAP in the last 168 hours.  Discharge time spent: greater than 30 minutes.  Signed: Geradine Girt, DO [facility] 01/12/2022

## 2022-01-13 ENCOUNTER — Other Ambulatory Visit (HOSPITAL_BASED_OUTPATIENT_CLINIC_OR_DEPARTMENT_OTHER): Payer: Self-pay

## 2022-01-14 ENCOUNTER — Other Ambulatory Visit: Payer: Self-pay | Admitting: Family Medicine

## 2022-01-14 ENCOUNTER — Ambulatory Visit: Payer: Non-veteran care | Admitting: Dietician

## 2022-01-14 DIAGNOSIS — K7030 Alcoholic cirrhosis of liver without ascites: Secondary | ICD-10-CM

## 2022-01-20 ENCOUNTER — Ambulatory Visit (INDEPENDENT_AMBULATORY_CARE_PROVIDER_SITE_OTHER): Payer: Non-veteran care | Admitting: Sports Medicine

## 2022-01-20 ENCOUNTER — Other Ambulatory Visit (HOSPITAL_BASED_OUTPATIENT_CLINIC_OR_DEPARTMENT_OTHER): Payer: Self-pay

## 2022-01-20 ENCOUNTER — Ambulatory Visit (INDEPENDENT_AMBULATORY_CARE_PROVIDER_SITE_OTHER): Payer: 59

## 2022-01-20 ENCOUNTER — Ambulatory Visit (INDEPENDENT_AMBULATORY_CARE_PROVIDER_SITE_OTHER): Payer: Non-veteran care

## 2022-01-20 DIAGNOSIS — M1812 Unilateral primary osteoarthritis of first carpometacarpal joint, left hand: Secondary | ICD-10-CM | POA: Diagnosis not present

## 2022-01-20 DIAGNOSIS — M542 Cervicalgia: Secondary | ICD-10-CM

## 2022-01-20 DIAGNOSIS — M5030 Other cervical disc degeneration, unspecified cervical region: Secondary | ICD-10-CM

## 2022-01-20 DIAGNOSIS — M25512 Pain in left shoulder: Secondary | ICD-10-CM

## 2022-01-20 DIAGNOSIS — M79642 Pain in left hand: Secondary | ICD-10-CM | POA: Diagnosis not present

## 2022-01-20 NOTE — Assessment & Plan Note (Signed)
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 We referred him to hand surgery and he did have the [facility] arthroplasty. Unfortunately still has pain in the thumb, localized mostly at the volar aspect and referable to the flexor pollicis longus, no obvious triggering, principal complaint is difficulty with gripping motions. Today we will inject his flexor pollicis longus tendon sheath. I would like x-rays of his hand. If insufficient improvement with the FPL tendon sheath injection we will certainly try another injection at the site of the surgery where the trapezium used to sit.

## 2022-01-20 NOTE — Progress Notes (Signed)
    Procedures performed today:    Procedure: Real-time Ultrasound Guided injection of the left flexor pollicis longus tendon sheath Device: Samsung [id number]  Verbal informed consent obtained.  Time-out conducted.  Noted no overlying erythema, induration, or other signs of local infection.  Skin prepped in a sterile fashion.  Local anesthesia: Topical Ethyl chloride.  With sterile technique and under real time ultrasound guidance: Noted mild tendon sheath effusion, 1/2 cc Kenalog 40, 1/2 cc lidocaine injected easily. Completed without difficulty  Advised to call if fevers/chills, erythema, induration, drainage, or persistent bleeding.  Images permanently stored and available for review in PACS.  Impression: Technically successful ultrasound guided injection.  Independent interpretation of notes and tests performed by another provider:   None.  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 We referred him to hand surgery and he did have the [facility] arthroplasty. Unfortunately still has pain in the thumb, localized mostly at the volar aspect and referable to the flexor pollicis longus, no obvious triggering, principal complaint is difficulty with gripping motions. Today we will inject his flexor pollicis longus tendon sheath. I would like x-rays of his hand. If insufficient improvement with the FPL tendon sheath injection we will certainly try another injection at the site of the surgery where the trapezium used to sit.  Shoulder pain, left, posterior Does continue to have some left posterior shoulder pain, periscapular, I initially suspected a radicular process from the neck, he did not respond sufficiently to prednisone, home  conditioning, Flexeril. He will continue home condition, he tells me at this point it does not hurt quite enough to proceed with MRI and epidural. We will watch this for now.  DDD (degenerative disc disease), cervical Degenerative changes on x-rays, persistent periscapular discomfort, this is in spite of greater than 6 weeks of conservative treatment. Proceeding with MRI.    ___________________________________________ Gwen Her. Dianah Field, M.D., ABFM., CAQSM. Primary Care and [place] Instructor of [employer] [facility]

## 2022-01-20 NOTE — Assessment & Plan Note (Signed)
Does continue to have some left posterior shoulder pain, periscapular, I initially suspected a radicular process from the neck, he did not respond sufficiently to prednisone, home conditioning, Flexeril. He will continue home condition, he tells me at this point it does not hurt quite enough to proceed with MRI and epidural. We will watch this for now.

## 2022-01-22 ENCOUNTER — Other Ambulatory Visit (HOSPITAL_BASED_OUTPATIENT_CLINIC_OR_DEPARTMENT_OTHER): Payer: Self-pay

## 2022-01-22 DIAGNOSIS — M5030 Other cervical disc degeneration, unspecified cervical region: Secondary | ICD-10-CM

## 2022-01-22 HISTORY — DX: Other cervical disc degeneration, unspecified cervical region: M50.30

## 2022-01-22 NOTE — Assessment & Plan Note (Signed)
Degenerative changes on x-rays, persistent periscapular discomfort, this is in spite of greater than 6 weeks of conservative treatment. Proceeding with MRI.

## 2022-01-22 NOTE — Addendum Note (Signed)
Addended by: Silverio Decamp on: 01/22/2022 05:02 PM   Modules accepted: Orders

## 2022-02-01 ENCOUNTER — Ambulatory Visit (INDEPENDENT_AMBULATORY_CARE_PROVIDER_SITE_OTHER): Payer: 59

## 2022-02-01 DIAGNOSIS — M542 Cervicalgia: Secondary | ICD-10-CM

## 2022-02-01 DIAGNOSIS — M5030 Other cervical disc degeneration, unspecified cervical region: Secondary | ICD-10-CM | POA: Diagnosis not present

## 2022-02-02 ENCOUNTER — Encounter: Payer: Self-pay | Admitting: Skilled Nursing Facility1

## 2022-02-02 ENCOUNTER — Encounter: Payer: 59 | Attending: Internal Medicine | Admitting: Skilled Nursing Facility1

## 2022-02-02 DIAGNOSIS — K7030 Alcoholic cirrhosis of liver without ascites: Secondary | ICD-10-CM | POA: Insufficient documentation

## 2022-02-02 NOTE — Progress Notes (Signed)
Medical Nutrition Therapy    Primary concerns today: Cirrhosis Nutrition  Referral diagnosis: W65.68 - Alcoholic cirrhosis, L27.51 - Acute esophagitis Preferred learning style: No preference indicated Learning readiness: Ready   NUTRITION ASSESSMENT   Anthropometrics   Wt: 161 lbs UBW: 165 lbs   Clinical Medical Hx: Chronic Alcoholic Liver Disease, Cirrhosis, Esophagitis, HTN, Portal HTN, Hypocalcemia, C. Diff Medications: Lactulose, Pantoprazole, Florastor Labs: AST - 75 (high), Total Bilirubin - 1.5 (high), Albumin - 3.3 (low), Magnesium - 1.3 (low), Potassium - 3.2 (low), Ammonia - 84 (high) Notable Signs/Symptoms: Muscular Weakness, Slight lower leg edema  Lifestyle & Dietary Hx  Pt accompanied by his wife  Pt states he has been doing the grocery shopping and has been reading all of the nutrition facts label.  Pt states he Tests his blood sugar when shaky and will eat peaches when a low (lows at 50). Pts wife states 18 was not as big of a deal since she has seen her aunt in the 74's: Dietitian educated pt on this topic advising 43 is a very big concern. Pt states he is Sometimes still skipping meals but doing much better overall no longer skipping for 2 days at a time.  Pt states he Stays on the computer drinking throughout the day.  Pt states he Sometimes vomiting or diarrhea 2 days a week once a month.  Pt states he never has any trouble swallowing his foods.   Pt is at a good weight considering his illness and boughts of diarrhea/vomiting and his wife can still lift him if the need arises.   Estimated daily fluid intake: 64 oz Supplements: Daily MV, Magnesium, Potassium, Thiamin, Folic Acid, B complex, Vit D Sleep: Ok, has PTSD that requires medication Stress / self-care: Lower since hospital release Current average weekly physical activity: ADLs, currently using a walker   24-Hr Dietary Recall First Meal: Bowl of raisin bran, lactaid milk Snack: none Second Meal:  Jordan Snack: none Third Meal: Consulting civil engineer breakfast bowl Snack: none Beverages: Gatorade Zero, Water, Ginger ale, 22 ounces orange juice, v8  Estimated Energy Needs Calories: 1875 - 2250 kcal Protein: 75 - 98g   NUTRITION DIAGNOSIS  Cache-1.4 Altered GI function As related to alcoholic cirrhosis.  As evidenced by history of alcohol abuse, elevated LFTs, elevated serum ammonia, and dramatically decreased appetite.   NUTRITION INTERVENTION  Nutrition education (E-1) on the following topics: continued Educated pt on the physiology associated with liver function and the results of liver damage. Educated pt on the need for a high calorie, high protein diet for cirrhosis of the liver. Advised pt to eat every 2-3 hours to help keep consistent caloric intake. Recommended a reduced sodium diet to decrease fluid retention.  Handouts Provided Include continued Cirrhosis Nutrition Therapy Nutrition Care Manual  Learning Style & Readiness for Change Teaching method utilized: Visual & Auditory  Demonstrated degree of understanding via: Teach Back  Barriers to learning/adherence to lifestyle change: None  Goals Established by Pt Consider switching to Fairlife brand milk. 1% milk is fine. Try adding in fat-free greek yogurt during the day for a high protein snack. Try Oikos Triple Zero: went well likes it Set an alarm to eat every 2-3 hours. If your appetite is low, try a few small bites. Sip on a Premier Protein shake throughout the day: gone pretty well  sometimes binging when waiting too long to eat but has reduced this behavior.  Browse your online grocery shopping site and look for lower sodium frozen  meals. Try to keep them below 500 mg per meal: reading the labels before purchasing foods Some brands to look for include Amy's, Healthy Choice, Smart Ones, [place] your fluid intake to in between your meals to allow the maximum amount of space you have for your food! NEW: If  emesis/diarrhea and inappetant have Pedialyte and v8 juice throughout the day NEW: stop drinking at 8pm and if still urinating throughout the night move that time up NEW: reduce caloric beverages throughout the day so you will have more of a hunger for meals   MONITORING & EVALUATION Dietary intake, weekly physical activity, and body weight as needed  Next Steps  Patient is to call or e-mail if any future questions or worries

## 2022-02-08 ENCOUNTER — Ambulatory Visit (INDEPENDENT_AMBULATORY_CARE_PROVIDER_SITE_OTHER): Payer: 59

## 2022-02-08 ENCOUNTER — Ambulatory Visit (INDEPENDENT_AMBULATORY_CARE_PROVIDER_SITE_OTHER): Payer: 59 | Admitting: Sports Medicine

## 2022-02-08 DIAGNOSIS — M5030 Other cervical disc degeneration, unspecified cervical region: Secondary | ICD-10-CM

## 2022-02-08 DIAGNOSIS — M1812 Unilateral primary osteoarthritis of first carpometacarpal joint, left hand: Secondary | ICD-10-CM

## 2022-02-08 NOTE — Assessment & Plan Note (Signed)
Juan [name] returns, we did an FPL tendon sheath injection at the last visit without sufficient improvement. He is post [facility] arthroplasty/trapeziectomy, still has pain volar thumb at the [facility]. We will do a CMC injection today, if insufficient improvement we will refer back to hand surgery.

## 2022-02-08 NOTE — Assessment & Plan Note (Signed)
Persistent neck pain, ordering cervical epidural. MRI in chart.

## 2022-02-08 NOTE — Progress Notes (Signed)
    Procedures performed today:    Procedure: Real-time Ultrasound Guided injection of the left first [facility] Device: Samsung HS60  Verbal informed consent obtained.  Time-out conducted.  Noted no overlying erythema, induration, or other signs of local infection.  Skin prepped in a sterile fashion.  Local anesthesia: Topical Ethyl chloride.  With sterile technique and under real time ultrasound guidance: Noted sided trapeziectomy, 1/2 cc lidocaine, 1/2 cc kenalog 40 injected easily. Completed without difficulty  Advised to call if fevers/chills, erythema, induration, drainage, or persistent bleeding.  Images permanently stored and available for review in PACS.  Impression: Technically successful ultrasound guided injection.  Independent interpretation of notes and tests performed by another provider:   None.  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 He is post [facility] arthroplasty/trapeziectomy, still has pain volar thumb at the [facility]. We will do a CMC injection today, if insufficient improvement we will refer back to hand surgery.  DDD (degenerative disc disease), cervical Persistent neck pain, ordering cervical epidural. MRI in chart.    ___________________________________________ Gwen Her. Dianah Field, M.D., ABFM., CAQSM. Primary Care and [place] Instructor of [place] [employer] [facility] [employer]

## 2022-02-10 ENCOUNTER — Telehealth: Payer: Self-pay | Admitting: Family Medicine

## 2022-02-10 NOTE — Telephone Encounter (Signed)
Patient dropped off [facility] Supplementary Form to be completed by PCP Zigmund Daniel) - possible fee and 3-5 day turn around. Paperwork placed in providers box - lmr.

## 2022-02-16 ENCOUNTER — Ambulatory Visit
Admission: RE | Admit: 2022-02-16 | Discharge: 2022-02-16 | Disposition: A | Discharge status: Home / Self Care | Payer: 59 | Source: Ambulatory Visit | Attending: Sports Medicine | Admitting: Sports Medicine

## 2022-02-16 DIAGNOSIS — M47812 Spondylosis without myelopathy or radiculopathy, cervical region: Secondary | ICD-10-CM | POA: Diagnosis not present

## 2022-02-16 DIAGNOSIS — M5030 Other cervical disc degeneration, unspecified cervical region: Secondary | ICD-10-CM

## 2022-02-16 MED ORDER — IOPAMIDOL (ISOVUE-M 300) INJECTION 61%
1.0000 mL | Freq: Once | INTRAMUSCULAR | Status: AC
  Administered 2022-02-16: 1 mL via EPIDURAL

## 2022-02-16 MED ORDER — TRIAMCINOLONE ACETONIDE 40 MG/ML IJ SUSP (RADIOLOGY)
60.0000 mg | Freq: Once | INTRAMUSCULAR | Status: AC
  Administered 2022-02-16: 60 mg via EPIDURAL

## 2022-02-17 ENCOUNTER — Encounter: Payer: Self-pay | Admitting: Family Medicine

## 2022-02-17 DIAGNOSIS — K7030 Alcoholic cirrhosis of liver without ascites: Secondary | ICD-10-CM | POA: Insufficient documentation

## 2022-02-17 DIAGNOSIS — K7031 Alcoholic cirrhosis of liver with ascites: Secondary | ICD-10-CM

## 2022-02-17 HISTORY — DX: Alcoholic cirrhosis of liver with ascites: K70.31

## 2022-02-20 DIAGNOSIS — K449 Diaphragmatic hernia without obstruction or gangrene: Secondary | ICD-10-CM

## 2022-02-20 HISTORY — DX: Diaphragmatic hernia without obstruction or gangrene: K44.9

## 2022-02-25 ENCOUNTER — Telehealth (INDEPENDENT_AMBULATORY_CARE_PROVIDER_SITE_OTHER): Payer: 59 | Admitting: Family Medicine

## 2022-02-25 ENCOUNTER — Other Ambulatory Visit (HOSPITAL_BASED_OUTPATIENT_CLINIC_OR_DEPARTMENT_OTHER): Payer: Self-pay

## 2022-02-25 ENCOUNTER — Encounter: Payer: Self-pay | Admitting: Family Medicine

## 2022-02-25 DIAGNOSIS — G4700 Insomnia, unspecified: Secondary | ICD-10-CM | POA: Diagnosis not present

## 2022-02-25 DIAGNOSIS — F418 Other specified anxiety disorders: Secondary | ICD-10-CM

## 2022-02-25 DIAGNOSIS — F411 Generalized anxiety disorder: Secondary | ICD-10-CM | POA: Diagnosis not present

## 2022-02-25 DIAGNOSIS — F17290 Nicotine dependence, other tobacco product, uncomplicated: Secondary | ICD-10-CM | POA: Diagnosis not present

## 2022-02-25 DIAGNOSIS — F1021 Alcohol dependence, in remission: Secondary | ICD-10-CM | POA: Diagnosis not present

## 2022-02-25 MED ORDER — HYDROXYZINE PAMOATE 25 MG PO CAPS
ORAL_CAPSULE | ORAL | 0 refills | Status: DC
  Filled 2022-02-25: qty 30, 8d supply, fill #0

## 2022-02-25 NOTE — Assessment & Plan Note (Signed)
Options limited due to his extensive liver disease.  Can not use benzos or similar due to his liver disease and history of EtOH abuse.  Discussed vistaril and will start 64m q6-8 hours as needed.

## 2022-02-25 NOTE — Progress Notes (Signed)
Juan [name] - 56 y.o. male MRN [id number]  Date of birth: [birth year]   This visit type was conducted due to national recommendations for restrictions regarding the COVID-19 Pandemic (e.g. social distancing).  This format is felt to be most appropriate for this patient at this time.  All issues noted in this document were discussed and addressed.  No physical exam was performed (except for noted visual exam findings with Video Visits).  I discussed the limitations of evaluation and management by telemedicine and the availability of in person appointments. The patient expressed understanding and agreed to proceed.  I connected withNAME@ on 02/25/22 at  3:10 PM EDT by a video enabled telemedicine application and verified that I am speaking with the correct person using two identifiers.  Present at visit: Juan Nutting, DO Juan [name]   Patient Location: [place] [address]   Provider location:   [place]  No chief complaint on file.   HPI  Juan [name] is a 56 y.o. male who presents via audio/video conferencing for a telehealth visit today.  He has concerns about anxiety related to being in the car.  He has increased anxiety when his wife is driving.  Doesn't feel like he has control and feels like she is going to fast as stops suddenly.  He is requesting something to help with his anxiety as they will be taking a 13 hour trip to [place].     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   Chronic fatigue    Cirrhosis ([facility])    Colon polyps    Depression    GERD (gastroesophageal reflux disease)    Hemochromatosis    Hx of blood clots    Leg   Hyperreflexia    Hypertension    PTSD (post-traumatic stress disorder)    Traumatic hemorrhagic shock [facility])     Past Surgical History:   Procedure Laterality Date   BIOPSY  09/24/2021   Procedure: BIOPSY;  Surgeon: Sharyn Creamer, MD;  Location: [place];  Service: Gastroenterology;;   [place] RELEASE Bilateral    COLONOSCOPY WITH PROPOFOL N/A 09/24/2021   Procedure: COLONOSCOPY WITH PROPOFOL;  Surgeon: Sharyn Creamer, MD;  Location: [place];  Service: Gastroenterology;  Laterality: N/A;   ESOPHAGOGASTRODUODENOSCOPY (EGD) WITH PROPOFOL N/A 09/24/2021   Procedure: ESOPHAGOGASTRODUODENOSCOPY (EGD) WITH PROPOFOL;  Surgeon: Sharyn Creamer, MD;  Location: [place];  Service: Gastroenterology;  Laterality: N/A;   FRACTURE SURGERY     left ankle plate   HERNIA REPAIR     inguinal   KNEE SURGERY Right    x 4   SHOULDER SURGERY Bilateral    x 2   VASECTOMY      Family History  Problem Relation Age of Onset   Pulmonary fibrosis Mother    Hypertension Father    Other Father        liver failure   Diabetes Brother    Breast cancer Paternal Aunt    Colon cancer Neg Hx    Esophageal cancer Neg Hx    Stomach cancer Neg Hx    Rectal cancer Neg Hx     Social History   Socioeconomic History   Marital status: Married    Spouse name: [name]   Number of children: 2   Years  of education: Not on file   Highest education level: High school graduate  Occupational History    Comment: disability  Tobacco Use   Smoking status: Never   Smokeless tobacco: Current    Types: Snuff  Vaping Use   Vaping Use: Never used  Substance and Sexual Activity   Alcohol use: Not Currently   Drug use: Never   Sexual activity: Not on file  Other Topics Concern   Not on file  Social History Narrative   Lives with wife   Social Determinants of Health   Financial Resource Strain: Not on file  Food Insecurity: Not on file  Transportation Needs: Not on file  Physical Activity: Not on file  Stress: Not on file  Social Connections: Not on file  Intimate Partner Violence: Not on file     Current Outpatient Medications:     acetaminophen (TYLENOL) 650 MG CR tablet, Take 1,300 mg by mouth every 8 (eight) hours as needed for pain., Disp: , Rfl:    aspirin 81 MG chewable tablet, Chew 81 mg by mouth every morning., Disp: , Rfl:    B Complex Vitamins (B COMPLEX PO), Take 1 tablet by mouth every morning., Disp: , Rfl:    busPIRone (BUSPAR) 15 MG tablet, Take 15 mg by mouth 2 (two) times daily., Disp: , Rfl:    Cholecalciferol 50 MCG (2000 UT) CAPS, Take 2,000 Units by mouth every morning., Disp: , Rfl:    cyclobenzaprine (FLEXERIL) 10 MG tablet, Take 1/2 to 1 tablet by mouth at bedtime, then increase gradually to 1 tablet 3 times a day (Patient taking differently: Take 10 mg by mouth 3 (three) times daily as needed for muscle spasms.), Disp: 30 tablet, Rfl: 0   EPINEPHrine 0.3 mg/0.3 mL IJ SOAJ injection, INJECT 0.3 MG INTO THE MUSCLE AS NEEDED FOR ANAPHYLAXIS. (Patient taking differently: Inject 0.3 mg into the muscle once as needed for anaphylaxis (severe allergic reaction).), Disp: 2 each, Rfl: 0   folic acid (FOLVITE) 1 MG tablet, TAKE 1 TABLET (1 MG TOTAL) BY MOUTH DAILY. (Patient taking differently: Take 1 mg by mouth every morning.), Disp: 90 tablet, Rfl: 3   hydrOXYzine (VISTARIL) 25 MG capsule, Take 1 capsule by mouth every 6 - 8 hours as needed for anxiety, Disp: 30 capsule, Rfl: 0   Magnesium Chloride 64 MG TABS, Take 2 tablets by mouth in the morning and at bedtime. (Patient taking differently: Take 64 mg by mouth in the morning and at bedtime.), Disp: 120 tablet, Rfl: 1   Multiple Vitamins-Minerals (YOUR LIFE MULTI ADULT GUMMIES) CHEW, Chew 1 tablet by mouth daily., Disp: , Rfl:    ondansetron (ZOFRAN-ODT) 8 MG disintegrating tablet, Dissolve 1 tablet (8 mg total) by mouth every 8 (eight) hours as needed for nausea., Disp: 20 tablet, Rfl: 1   pantoprazole (PROTONIX) 40 MG tablet, Take 1 tablet (40 mg total) by mouth daily., Disp: 60 tablet, Rfl: 1   prazosin (MINIPRESS) 2 MG capsule, Take 4 mg by mouth at  bedtime., Disp: , Rfl:    saccharomyces boulardii (FLORASTOR) 250 MG capsule, Take 250 mg by mouth 2 (two) times daily. , Disp: , Rfl:    vortioxetine HBr (TRINTELLIX) 10 MG TABS tablet, Take 1 tablet (10 mg total) by mouth daily., Disp: 30 tablet, Rfl: 3   potassium chloride SA (KLOR-CON M) 20 MEQ tablet, Take 2 tablets (40 mEq total) by mouth 2 (two) times daily. (Patient taking differently: Take 20 mEq by mouth every morning.), Disp:  120 tablet, Rfl: 1  EXAM:  VITALS per patient if applicable: BP 051/10   Ht 5' 5"  (1.651 m)   Wt 151 lb (68.5 kg)   SpO2 98%   BMI 25.13 kg/m   GENERAL: alert, oriented, appears well and in no acute distress  HEENT: atraumatic, conjunttiva clear, no obvious abnormalities on inspection of external nose and ears  NECK: normal movements of the head and neck  LUNGS: on inspection no signs of respiratory distress, breathing rate appears normal, no obvious gross SOB, gasping or wheezing  CV: no obvious cyanosis  MS: moves all visible extremities without noticeable abnormality  PSYCH/NEURO: pleasant and cooperative, no obvious depression or anxiety, speech and thought processing grossly intact  ASSESSMENT AND PLAN:  Discussed the following assessment and plan:  Situational anxiety Options limited due to his extensive liver disease.  Can not use benzos or similar due to his liver disease and history of EtOH abuse.  Discussed vistaril and will start 70m q6-8 hours as needed.       I discussed the assessment and treatment plan with the patient. The patient was provided an opportunity to ask questions and all were answered. The patient agreed with the plan and demonstrated an understanding of the instructions.   The patient was advised to call back or seek an in-person evaluation if the symptoms worsen or if the condition fails to improve as anticipated.    CLuetta Nutting DO

## 2022-03-01 ENCOUNTER — Inpatient Hospital Stay (HOSPITAL_BASED_OUTPATIENT_CLINIC_OR_DEPARTMENT_OTHER)
Admission: EM | Admit: 2022-03-01 | Discharge: 2022-03-04 | DRG: 378 | Disposition: A | Discharge status: Home / Self Care | Payer: No Typology Code available for payment source | Attending: Internal Medicine | Admitting: Internal Medicine

## 2022-03-01 ENCOUNTER — Encounter (HOSPITAL_BASED_OUTPATIENT_CLINIC_OR_DEPARTMENT_OTHER): Payer: Self-pay

## 2022-03-01 ENCOUNTER — Other Ambulatory Visit: Payer: Self-pay

## 2022-03-01 ENCOUNTER — Emergency Department (HOSPITAL_BASED_OUTPATIENT_CLINIC_OR_DEPARTMENT_OTHER): Payer: No Typology Code available for payment source

## 2022-03-01 DIAGNOSIS — Z833 Family history of diabetes mellitus: Secondary | ICD-10-CM

## 2022-03-01 DIAGNOSIS — D684 Acquired coagulation factor deficiency: Secondary | ICD-10-CM | POA: Diagnosis present

## 2022-03-01 DIAGNOSIS — Z993 Dependence on wheelchair: Secondary | ICD-10-CM | POA: Diagnosis not present

## 2022-03-01 DIAGNOSIS — K3189 Other diseases of stomach and duodenum: Secondary | ICD-10-CM | POA: Diagnosis present

## 2022-03-01 DIAGNOSIS — Z91018 Allergy to other foods: Secondary | ICD-10-CM

## 2022-03-01 DIAGNOSIS — N3941 Urge incontinence: Secondary | ICD-10-CM | POA: Diagnosis not present

## 2022-03-01 DIAGNOSIS — R7989 Other specified abnormal findings of blood chemistry: Secondary | ICD-10-CM | POA: Diagnosis not present

## 2022-03-01 DIAGNOSIS — F411 Generalized anxiety disorder: Secondary | ICD-10-CM | POA: Diagnosis present

## 2022-03-01 DIAGNOSIS — K766 Portal hypertension: Secondary | ICD-10-CM | POA: Diagnosis present

## 2022-03-01 DIAGNOSIS — F17290 Nicotine dependence, other tobacco product, uncomplicated: Secondary | ICD-10-CM | POA: Diagnosis present

## 2022-03-01 DIAGNOSIS — E739 Lactose intolerance, unspecified: Secondary | ICD-10-CM | POA: Diagnosis present

## 2022-03-01 DIAGNOSIS — K449 Diaphragmatic hernia without obstruction or gangrene: Secondary | ICD-10-CM | POA: Diagnosis present

## 2022-03-01 DIAGNOSIS — K921 Melena: Principal | ICD-10-CM | POA: Diagnosis present

## 2022-03-01 DIAGNOSIS — I10 Essential (primary) hypertension: Secondary | ICD-10-CM | POA: Diagnosis present

## 2022-03-01 DIAGNOSIS — K7031 Alcoholic cirrhosis of liver with ascites: Secondary | ICD-10-CM | POA: Diagnosis present

## 2022-03-01 DIAGNOSIS — E871 Hypo-osmolality and hyponatremia: Secondary | ICD-10-CM | POA: Diagnosis present

## 2022-03-01 DIAGNOSIS — Z881 Allergy status to other antibiotic agents status: Secondary | ICD-10-CM

## 2022-03-01 DIAGNOSIS — K8020 Calculus of gallbladder without cholecystitis without obstruction: Secondary | ICD-10-CM | POA: Diagnosis present

## 2022-03-01 DIAGNOSIS — E876 Hypokalemia: Secondary | ICD-10-CM | POA: Diagnosis present

## 2022-03-01 DIAGNOSIS — G721 Alcoholic myopathy: Secondary | ICD-10-CM | POA: Diagnosis present

## 2022-03-01 DIAGNOSIS — N3943 Post-void dribbling: Secondary | ICD-10-CM | POA: Diagnosis not present

## 2022-03-01 DIAGNOSIS — Z803 Family history of malignant neoplasm of breast: Secondary | ICD-10-CM

## 2022-03-01 DIAGNOSIS — Z888 Allergy status to other drugs, medicaments and biological substances status: Secondary | ICD-10-CM

## 2022-03-01 DIAGNOSIS — R350 Frequency of micturition: Secondary | ICD-10-CM | POA: Diagnosis not present

## 2022-03-01 DIAGNOSIS — K2100 Gastro-esophageal reflux disease with esophagitis, without bleeding: Secondary | ICD-10-CM | POA: Diagnosis present

## 2022-03-01 DIAGNOSIS — D649 Anemia, unspecified: Secondary | ICD-10-CM

## 2022-03-01 DIAGNOSIS — Z7982 Long term (current) use of aspirin: Secondary | ICD-10-CM

## 2022-03-01 DIAGNOSIS — K5900 Constipation, unspecified: Secondary | ICD-10-CM | POA: Diagnosis present

## 2022-03-01 DIAGNOSIS — Z8249 Family history of ischemic heart disease and other diseases of the circulatory system: Secondary | ICD-10-CM

## 2022-03-01 DIAGNOSIS — K219 Gastro-esophageal reflux disease without esophagitis: Secondary | ICD-10-CM

## 2022-03-01 DIAGNOSIS — N401 Enlarged prostate with lower urinary tract symptoms: Secondary | ICD-10-CM | POA: Diagnosis not present

## 2022-03-01 DIAGNOSIS — I350 Nonrheumatic aortic (valve) stenosis: Secondary | ICD-10-CM | POA: Diagnosis present

## 2022-03-01 DIAGNOSIS — F4310 Post-traumatic stress disorder, unspecified: Secondary | ICD-10-CM | POA: Diagnosis present

## 2022-03-01 DIAGNOSIS — K922 Gastrointestinal hemorrhage, unspecified: Secondary | ICD-10-CM | POA: Diagnosis present

## 2022-03-01 DIAGNOSIS — Z91013 Allergy to seafood: Secondary | ICD-10-CM

## 2022-03-01 DIAGNOSIS — Z720 Tobacco use: Secondary | ICD-10-CM

## 2022-03-01 DIAGNOSIS — F332 Major depressive disorder, recurrent severe without psychotic features: Secondary | ICD-10-CM | POA: Diagnosis present

## 2022-03-01 DIAGNOSIS — D61818 Other pancytopenia: Secondary | ICD-10-CM | POA: Diagnosis present

## 2022-03-01 DIAGNOSIS — K7460 Unspecified cirrhosis of liver: Secondary | ICD-10-CM

## 2022-03-01 DIAGNOSIS — Z79899 Other long term (current) drug therapy: Secondary | ICD-10-CM

## 2022-03-01 DIAGNOSIS — D696 Thrombocytopenia, unspecified: Secondary | ICD-10-CM | POA: Diagnosis present

## 2022-03-01 DIAGNOSIS — K7030 Alcoholic cirrhosis of liver without ascites: Secondary | ICD-10-CM

## 2022-03-01 DIAGNOSIS — Z91010 Allergy to peanuts: Secondary | ICD-10-CM

## 2022-03-01 LAB — COMPREHENSIVE METABOLIC PANEL
ALT: 23 U/L (ref 0–44)
AST: 93 U/L — ABNORMAL HIGH (ref 15–41)
Albumin: 2.7 g/dL — ABNORMAL LOW (ref 3.5–5.0)
Alkaline Phosphatase: 73 U/L (ref 38–126)
Anion gap: 10 (ref 5–15)
BUN: 10 mg/dL (ref 6–20)
CO2: 21 mmol/L — ABNORMAL LOW (ref 22–32)
Calcium: 9.2 mg/dL (ref 8.9–10.3)
Chloride: 101 mmol/L (ref 98–111)
Creatinine, Ser: 0.71 mg/dL (ref 0.61–1.24)
GFR, Estimated: >60 mL/min (ref >60)
Glucose, Bld: 93 mg/dL (ref 70–99)
Potassium: 2.7 mmol/L — CL (ref 3.5–5.1)
Sodium: 132 mmol/L — ABNORMAL LOW (ref 135–145)
Total Bilirubin: 3.2 mg/dL — ABNORMAL HIGH (ref 0.3–1.2)
Total Protein: 6.3 g/dL — ABNORMAL LOW (ref 6.5–8.1)

## 2022-03-01 LAB — CBC
HCT: 27.3 % — ABNORMAL LOW (ref 39.0–52.0)
Hemoglobin: 9.2 g/dL — ABNORMAL LOW (ref 13.0–17.0)
MCH: 31.1 pg (ref 26.0–34.0)
MCHC: 33.7 g/dL (ref 30.0–36.0)
MCV: 92.2 fL (ref 80.0–100.0)
Platelets: 50 10*3/uL — ABNORMAL LOW (ref 150–400)
RBC: 2.96 MIL/uL — ABNORMAL LOW (ref 4.22–5.81)
RDW: 17.9 % — ABNORMAL HIGH (ref 11.5–15.5)
WBC: 6.5 10*3/uL (ref 4.0–10.5)
nRBC: 0 % (ref 0.0–0.2)

## 2022-03-01 LAB — MAGNESIUM: Magnesium: 0.8 mg/dL — CL (ref 1.7–2.4)

## 2022-03-01 LAB — HEPATIC FUNCTION PANEL
ALT: 24 U/L (ref 0–44)
AST: 95 U/L — ABNORMAL HIGH (ref 15–41)
Albumin: 2.7 g/dL — ABNORMAL LOW (ref 3.5–5.0)
Alkaline Phosphatase: 68 U/L (ref 38–126)
Bilirubin, Direct: 1.2 mg/dL — ABNORMAL HIGH (ref 0.0–0.2)
Indirect Bilirubin: 2 mg/dL — ABNORMAL HIGH (ref 0.3–0.9)
Total Bilirubin: 3.2 mg/dL — ABNORMAL HIGH (ref 0.3–1.2)
Total Protein: 6.2 g/dL — ABNORMAL LOW (ref 6.5–8.1)

## 2022-03-01 LAB — OCCULT BLOOD X 1 CARD TO LAB, STOOL: Fecal Occult Bld: POSITIVE — AB

## 2022-03-01 LAB — PROTIME-INR
INR: 1.5 — ABNORMAL HIGH (ref 0.8–1.2)
Prothrombin Time: 17.7 seconds — ABNORMAL HIGH (ref 11.4–15.2)

## 2022-03-01 LAB — PHOSPHORUS: Phosphorus: 2.8 mg/dL (ref 2.5–4.6)

## 2022-03-01 MED ORDER — PANTOPRAZOLE 80MG IVPB - SIMPLE MED
80.0000 mg | Freq: Once | INTRAVENOUS | Status: AC
  Administered 2022-03-01: 80 mg via INTRAVENOUS
  Filled 2022-03-01: qty 100

## 2022-03-01 MED ORDER — CHLORHEXIDINE GLUCONATE CLOTH 2 % EX PADS: 6.0000 | MEDICATED_PAD | Freq: Every day | CUTANEOUS | Status: DC

## 2022-03-01 MED ORDER — ORAL CARE MOUTH RINSE: 15.0000 mL | OROMUCOSAL | Status: DC | PRN

## 2022-03-01 MED ORDER — POTASSIUM CHLORIDE 10 MEQ/100ML IV SOLN
10.0000 meq | INTRAVENOUS | Status: AC
  Administered 2022-03-01 (×3): 10 meq via INTRAVENOUS
  Filled 2022-03-01 (×3): qty 100

## 2022-03-01 MED ORDER — PANTOPRAZOLE INFUSION (NEW) - SIMPLE MED
8.0000 mg/h | INTRAVENOUS | Status: DC
  Administered 2022-03-01: 8 mg/h via INTRAVENOUS
  Filled 2022-03-01 (×2): qty 100

## 2022-03-01 MED ORDER — PANTOPRAZOLE SODIUM 40 MG IV SOLR
INTRAVENOUS | Status: AC
  Filled 2022-03-01: qty 40

## 2022-03-01 MED ORDER — PANTOPRAZOLE SODIUM 40 MG IV SOLR
40.0000 mg | Freq: Once | INTRAVENOUS | Status: AC
  Administered 2022-03-01: 40 mg via INTRAVENOUS
  Filled 2022-03-01: qty 10

## 2022-03-01 MED ORDER — ONDANSETRON HCL 4 MG/2ML IJ SOLN
4.0000 mg | Freq: Once | INTRAMUSCULAR | Status: AC
  Administered 2022-03-01: 4 mg via INTRAVENOUS
  Filled 2022-03-01: qty 2

## 2022-03-01 MED ORDER — IOHEXOL 350 MG/ML SOLN
100.0000 mL | Freq: Once | INTRAVENOUS | Status: AC | PRN
  Administered 2022-03-01: 100 mL via INTRAVENOUS

## 2022-03-01 MED ORDER — MAGNESIUM SULFATE 2 GM/50ML IV SOLN
2.0000 g | Freq: Once | INTRAVENOUS | Status: AC
Start: 2022-03-01 — End: 2022-03-01
  Administered 2022-03-01: 2 g via INTRAVENOUS
  Filled 2022-03-01: qty 50

## 2022-03-01 NOTE — ED Notes (Signed)
Pt was able to walk with a walker under his own power.  I changed his brief and got him repositioned in bed.

## 2022-03-01 NOTE — ED Notes (Signed)
ED Provider at bedside. 

## 2022-03-01 NOTE — ED Provider Notes (Signed)
[facility]ENCY [facility]MENT Provider Note   CSN: [id number] Arrival date & time: 03/01/22  1436     History  Chief Complaint  Patient presents with   Hematochezia    Juan [name] is a 56 y.o. male with history of GERD and alcoholic cirrhosis who presents the emergency department complaining of bright red blood per stool x2 days.  Reports stools have been looser, but not diarrhea. No dark stools or rectal pain. Patient states he has had GI bleeding multiple times follows with gastroenterology and hepatology.  He is complaining of sharp upper abdominal pain and nausea, no vomiting.  Also notes history of low platelets and low magnesium in the past.  Has not required a blood transfusion before.  HPI     Home Medications Prior to Admission medications   Medication Sig Start Date End Date Taking? Authorizing Provider  acetaminophen (TYLENOL) 650 MG CR tablet Take 1,300 mg by mouth every 8 (eight) hours as needed for pain.    [provider]  aspirin 81 MG chewable tablet Chew 81 mg by mouth every morning.    [provider]  B Complex Vitamins (B COMPLEX PO) Take 1 tablet by mouth every morning.    [provider]  busPIRone (BUSPAR) 15 MG tablet Take 15 mg by mouth 2 (two) times daily. 08/27/21   [provider]  Cholecalciferol 50 MCG (2000 UT) CAPS Take 2,000 Units by mouth every morning.    [provider]  cyclobenzaprine (FLEXERIL) 10 MG tablet Take 1/2 to 1 tablet by mouth at bedtime, then increase gradually to 1 tablet 3 times a day Patient taking differently: Take 10 mg by mouth 3 (three) times daily as needed for muscle spasms. 12/22/21   Silverio Decamp, MD  EPINEPHrine 0.3 mg/0.3 mL IJ SOAJ injection INJECT 0.3 MG INTO THE MUSCLE AS NEEDED FOR ANAPHYLAXIS. Patient taking differently: Inject 0.3 mg into the muscle once as needed for anaphylaxis (severe allergic reaction). 04/08/21 04/08/22  Hali Marry, MD   folic acid (FOLVITE) 1 MG tablet TAKE 1 TABLET (1 MG TOTAL) BY MOUTH DAILY. Patient taking differently: Take 1 mg by mouth every morning. 06/22/21 06/22/22  Silverio Decamp, MD  hydrOXYzine (VISTARIL) 25 MG capsule Take 1 capsule by mouth every 6 - 8 hours as needed for anxiety 02/25/22   [name], DO  Magnesium Chloride 64 MG TABS Take 2 tablets by mouth in the morning and at bedtime. Patient taking differently: Take 64 mg by mouth in the morning and at bedtime. 11/18/21   [name], DO  Multiple Vitamins-Minerals (YOUR LIFE MULTI ADULT GUMMIES) CHEW Chew 1 tablet by mouth daily.    [provider]  ondansetron (ZOFRAN-ODT) 8 MG disintegrating tablet Dissolve 1 tablet (8 mg total) by mouth every 8 (eight) hours as needed for nausea. 07/14/21   Terrilyn Saver, NP  pantoprazole (PROTONIX) 40 MG tablet Take 1 tablet (40 mg total) by mouth daily. 11/20/21 11/20/22  Sharyn Creamer, MD  potassium chloride SA (KLOR-CON M) 20 MEQ tablet Take 2 tablets (40 mEq total) by mouth 2 (two) times daily. Patient taking differently: Take 20 mEq by mouth every morning. 11/18/21 01/11/22  [name], DO  prazosin (MINIPRESS) 2 MG capsule Take 4 mg by mouth at bedtime. 05/28/21   [provider]  saccharomyces boulardii (FLORASTOR) 250 MG capsule Take 250 mg by mouth 2 (two) times daily.     [provider]  vortioxetine HBr (Boaz)  10 MG TABS tablet Take 1 tablet (10 mg total) by mouth daily. 09/02/21   [name], DO      Allergies    Cucumber extract, Peanut butter flavor, Peanut oil, Shellfish allergy, Cantaloupe extract allergy skin test, Lactose, Strawberry extract, Vancomycin, Apple juice, Codeine, and Depakote er [divalproex sodium er]    Review of Systems   Review of Systems  Constitutional:  Negative for fever.  Gastrointestinal:  Positive for abdominal pain, blood in stool and nausea. Negative for rectal pain and vomiting.  Skin:  Positive for pallor.   Hematological:  Bruises/bleeds easily.  All other systems reviewed and are negative.   Physical Exam Updated Vital Signs BP 135/82   Pulse 86   Temp 99 F (37.2 C) (Oral)   Resp (!) 21   Ht 5' 3"  (1.6 m)   Wt 73 kg   SpO2 98%   BMI 28.52 kg/m  Physical Exam Vitals and nursing note reviewed. Exam conducted with a chaperone present.  Constitutional:      Appearance: Normal appearance.  HENT:     Head: Normocephalic and atraumatic.  Eyes:     Conjunctiva/sclera: Conjunctivae normal.  Cardiovascular:     Rate and Rhythm: Normal rate and regular rhythm.     Heart sounds: Murmur heard.     Systolic murmur is present.     Comments: Aortic stenosis systolic murmur Pulmonary:     Effort: Pulmonary effort is normal. No respiratory distress.     Breath sounds: Normal breath sounds.  Abdominal:     General: There is no distension.     Palpations: Abdomen is soft.     Tenderness: There is abdominal tenderness in the epigastric area. There is no guarding.  Genitourinary:    Rectum: Guaiac result positive. No anal fissure.  Skin:    General: Skin is warm and dry.     Comments: Pale and slightly jaundiced  Neurological:     General: No focal deficit present.     Mental Status: He is alert.     ED Results / Procedures / Treatments   Labs (all labs ordered are listed, but only abnormal results are displayed) Labs Reviewed  COMPREHENSIVE METABOLIC PANEL - Abnormal; Notable for the following components:      Result Value   Sodium 132 (*)    Potassium 2.7 (*)    CO2 21 (*)    Total Protein 6.3 (*)    Albumin 2.7 (*)    AST 93 (*)    Total Bilirubin 3.2 (*)    All other components within normal limits  CBC - Abnormal; Notable for the following components:   RBC 2.96 (*)    Hemoglobin 9.2 (*)    HCT 27.3 (*)    RDW 17.9 (*)    Platelets 50 (*)    All other components within normal limits  MAGNESIUM - Abnormal; Notable for the following components:   Magnesium 0.8 (*)     All other components within normal limits  OCCULT BLOOD X 1 CARD TO LAB, STOOL - Abnormal; Notable for the following components:   Fecal Occult Bld POSITIVE (*)    All other components within normal limits  PROTIME-INR  PHOSPHORUS  HEMOGLOBIN AND HEMATOCRIT, BLOOD  HEPATIC FUNCTION PANEL    EKG None  Radiology No results found.  Procedures .Critical Care  Performed by: Kateri Plummer, PA-C Authorized by: Estill Cotta   Critical care provider statement:    Critical care time (  minutes):  30   Critical care time was exclusive of:  Separately billable procedures and treating other patients   Critical care was necessary to treat or prevent imminent or life-threatening deterioration of the following conditions:  Metabolic crisis   Critical care was time spent personally by me on the following activities:  Development of treatment plan with patient or surrogate, discussions with consultants, evaluation of patient's response to treatment, examination of patient, ordering and review of laboratory studies, ordering and review of radiographic studies, ordering and performing treatments and interventions, pulse oximetry, re-evaluation of patient's condition and review of old charts     Medications Ordered in ED Medications  potassium chloride 10 mEq in 100 mL IVPB (10 mEq Intravenous New Bag/Given 03/01/22 1830)  pantoprazole (PROTONIX) 80 mg /NS 100 mL IVPB (has no administration in time range)  pantoprozole (PROTONIX) 80 mg /NS 100 mL infusion (has no administration in time range)  pantoprazole (PROTONIX) injection 40 mg (40 mg Intravenous Given 03/01/22 1557)  ondansetron (ZOFRAN) injection 4 mg (4 mg Intravenous Given 03/01/22 1558)  magnesium sulfate IVPB 2 g 50 mL (0 g Intravenous Stopped 03/01/22 1723)    ED Course/ Medical Decision Making/ A&P                           Medical Decision Making Amount and/or Complexity of Data Reviewed Labs:  ordered.  Risk Prescription drug management.  This patient is a 56 y.o. male  who presents to the ED for concern of abdominal pain and bright red blood per rectum x2 days.  Denies any rectal pain.  Complaining of nausea, no vomiting.  Differential diagnoses prior to evaluation: The emergent differential diagnosis includes, but is not limited to,  Diverticulitis, IBD, colitis, mesenteric ischemia, colorectal cancer / polyps, hemorrhoids, rectal foreign body, anal fissure. This is not an exhaustive differential.   Past Medical History / Co-morbidities: Chronic alcoholic liver disease with cirrhosis, hypertension, thrombocytopenia, portal hypertension, PTSD  Additional history: Chart reviewed. Pertinent results include: Patient follows with [facility], as well as hepatology.  Was admitted on 1/27 for pancytopenia secondary to upper GI bleed.  Had upper endoscopy and colonoscopy performed that showed esophagitis and grade 3 internal hemorrhoids.  Had repeat upper endoscopy in March with no significant findings other than portal hypertensive gastropathy. Also reviewed prior labs to compare to today.  Physical Exam: Physical exam performed. The pertinent findings include: Patient is pale and slightly jaundiced. Abdomen soft with epigastric tenderness to palpation. Positive hemoccult.   Lab Tests/Imaging studies: I personally interpreted labs/imaging and the pertinent results include :  hemoglobin 9.2 (compared to 10.1 one month ago), platelets 50 (prior 31), sodium 132, potassium 2.7, magnesium 0.8. Positive hemoccult. Liver function stable.  CT angiogram of abdomen/pelvis pending at time of admission.   Medications: I ordered medication including protonix, zofran, magnesium and potassium replacement.  I have reviewed the patients home medicines and have made adjustments as needed.  Consultations obtained: I consulted with gastroenterologist Dr Tarri Glenn who will add patient to rounding list for  tomorrow morning.   I consulted with hospitalist Dr Roel Cluck at [facility] who will admit.    Disposition: After consideration of the diagnostic results and the patients response to treatment, I feel that patient is requiring admission for gastrointestinal bleeding with thrombocytopenia, hypokalemia, and hypomagnesemia.   Final Clinical Impression(s) / ED Diagnoses Final diagnoses:  Gastrointestinal hemorrhage, unspecified gastrointestinal hemorrhage type  Alcoholic cirrhosis, unspecified whether ascites  present [facility])    Rx / DC Orders ED Discharge Orders     None      Portions of this report may have been transcribed using voice recognition software. Every effort was made to ensure accuracy; however, inadvertent computerized transcription errors may be present.    Estill Cotta 03/01/22 1851    Dorie Rank, MD 03/08/22 [phone]

## 2022-03-01 NOTE — ED Triage Notes (Signed)
C/o GI bleeding x 2 days. Bright red blood in stool. Hx of cirrhosis. Sharp abdominal pain. Hx of GI bleed, low platelets. C/o dizziness, nausea

## 2022-03-02 DIAGNOSIS — R7989 Other specified abnormal findings of blood chemistry: Secondary | ICD-10-CM

## 2022-03-02 DIAGNOSIS — K7460 Unspecified cirrhosis of liver: Secondary | ICD-10-CM

## 2022-03-02 DIAGNOSIS — D649 Anemia, unspecified: Secondary | ICD-10-CM

## 2022-03-02 DIAGNOSIS — E871 Hypo-osmolality and hyponatremia: Secondary | ICD-10-CM

## 2022-03-02 DIAGNOSIS — K922 Gastrointestinal hemorrhage, unspecified: Secondary | ICD-10-CM

## 2022-03-02 DIAGNOSIS — Z720 Tobacco use: Secondary | ICD-10-CM

## 2022-03-02 DIAGNOSIS — K219 Gastro-esophageal reflux disease without esophagitis: Secondary | ICD-10-CM

## 2022-03-02 HISTORY — DX: Tobacco use: Z72.0

## 2022-03-02 HISTORY — DX: Hypo-osmolality and hyponatremia: E87.1

## 2022-03-02 HISTORY — DX: Anemia, unspecified: D64.9

## 2022-03-02 LAB — CBC WITH DIFFERENTIAL/PLATELET
Abs Immature Granulocytes: 0.02 10*3/uL (ref 0.00–0.07)
Basophils Absolute: 0.1 10*3/uL (ref 0.0–0.1)
Basophils Relative: 1 %
Eosinophils Absolute: 0.2 10*3/uL (ref 0.0–0.5)
Eosinophils Relative: 3 %
HCT: 29.3 % — ABNORMAL LOW (ref 39.0–52.0)
Hemoglobin: 9.6 g/dL — ABNORMAL LOW (ref 13.0–17.0)
Immature Granulocytes: 0 %
Lymphocytes Relative: 14 %
Lymphs Abs: 1 10*3/uL (ref 0.7–4.0)
MCH: 31 pg (ref 26.0–34.0)
MCHC: 32.8 g/dL (ref 30.0–36.0)
MCV: 94.5 fL (ref 80.0–100.0)
Monocytes Absolute: 1 10*3/uL (ref 0.1–1.0)
Monocytes Relative: 15 %
Neutro Abs: 4.7 10*3/uL (ref 1.7–7.7)
Neutrophils Relative %: 67 %
Platelets: 71 10*3/uL — ABNORMAL LOW (ref 150–400)
RBC: 3.1 MIL/uL — ABNORMAL LOW (ref 4.22–5.81)
RDW: 18.1 % — ABNORMAL HIGH (ref 11.5–15.5)
WBC: 7 10*3/uL (ref 4.0–10.5)
nRBC: 0 % (ref 0.0–0.2)

## 2022-03-02 LAB — BASIC METABOLIC PANEL
Anion gap: 8 (ref 5–15)
BUN: 11 mg/dL (ref 6–20)
CO2: 23 mmol/L (ref 22–32)
Calcium: 8.5 mg/dL — ABNORMAL LOW (ref 8.9–10.3)
Chloride: 105 mmol/L (ref 98–111)
Creatinine, Ser: 0.7 mg/dL (ref 0.61–1.24)
GFR, Estimated: >60 mL/min (ref >60)
Glucose, Bld: 87 mg/dL (ref 70–99)
Potassium: 3.4 mmol/L — ABNORMAL LOW (ref 3.5–5.1)
Sodium: 136 mmol/L (ref 135–145)

## 2022-03-02 LAB — TYPE AND SCREEN
ABO/RH(D): B POS
Antibody Screen: NEGATIVE

## 2022-03-02 LAB — MAGNESIUM: Magnesium: 1.8 mg/dL (ref 1.7–2.4)

## 2022-03-02 LAB — HEMOGLOBIN AND HEMATOCRIT, BLOOD
HCT: 30.1 % — ABNORMAL LOW (ref 39.0–52.0)
HCT: 28.7 % — ABNORMAL LOW (ref 39.0–52.0)
Hemoglobin: 9.7 g/dL — ABNORMAL LOW (ref 13.0–17.0)
Hemoglobin: 9.5 g/dL — ABNORMAL LOW (ref 13.0–17.0)

## 2022-03-02 LAB — IRON: Iron: 77 ug/dL (ref 45–182)

## 2022-03-02 LAB — MRSA NEXT GEN BY PCR, NASAL: MRSA by PCR Next Gen: NOT DETECTED

## 2022-03-02 LAB — FERRITIN: Ferritin: 36 ng/mL (ref 24–336)

## 2022-03-02 MED ORDER — SODIUM CHLORIDE 0.9 % IV SOLN
1.0000 g | Freq: Every day | INTRAVENOUS | Status: DC
  Administered 2022-03-02 – 2022-03-04 (×3): 1 g via INTRAVENOUS
  Filled 2022-03-02 (×3): qty 10

## 2022-03-02 MED ORDER — SODIUM CHLORIDE 0.9% IV SOLUTION: Freq: Once | INTRAVENOUS | Status: DC

## 2022-03-02 MED ORDER — PANTOPRAZOLE SODIUM 40 MG PO TBEC
40.0000 mg | DELAYED_RELEASE_TABLET | Freq: Every day | ORAL | Status: DC
  Administered 2022-03-03 – 2022-03-04 (×2): 40 mg via ORAL
  Filled 2022-03-02 (×2): qty 1

## 2022-03-02 MED ORDER — SENNA 8.6 MG PO TABS
1.0000 | ORAL_TABLET | Freq: Once | ORAL | Status: AC | PRN
Start: 2022-03-02 — End: 2022-03-02
  Administered 2022-03-02: 8.6 mg via ORAL
  Filled 2022-03-02: qty 1

## 2022-03-02 MED ORDER — HYDROCORTISONE ACETATE 25 MG RE SUPP
25.0000 mg | Freq: Every day | RECTAL | Status: DC
  Administered 2022-03-02 – 2022-03-03 (×2): 25 mg via RECTAL
  Filled 2022-03-02 (×3): qty 1

## 2022-03-02 MED ORDER — MORPHINE SULFATE (PF) 2 MG/ML IV SOLN
2.0000 mg | INTRAVENOUS | Status: DC | PRN
  Administered 2022-03-02 (×3): 2 mg via INTRAVENOUS
  Filled 2022-03-02 (×3): qty 1

## 2022-03-02 MED ORDER — MAGNESIUM SULFATE 2 GM/50ML IV SOLN
2.0000 g | Freq: Once | INTRAVENOUS | Status: AC
Start: 2022-03-02 — End: 2022-03-02
  Administered 2022-03-02: 2 g via INTRAVENOUS
  Filled 2022-03-02: qty 50

## 2022-03-02 MED ORDER — OXYCODONE HCL 5 MG PO TABS
5.0000 mg | ORAL_TABLET | Freq: Four times a day (QID) | ORAL | Status: AC | PRN
  Administered 2022-03-02 – 2022-03-03 (×2): 5 mg via ORAL
  Filled 2022-03-02 (×2): qty 1

## 2022-03-02 MED ORDER — CHLORHEXIDINE GLUCONATE CLOTH 2 % EX PADS
6.0000 | MEDICATED_PAD | Freq: Every day | CUTANEOUS | Status: DC
  Administered 2022-03-02 – 2022-03-03 (×3): 6 via TOPICAL

## 2022-03-02 MED ORDER — POTASSIUM CHLORIDE 10 MEQ/100ML IV SOLN
10.0000 meq | INTRAVENOUS | Status: AC
  Administered 2022-03-02 (×2): 10 meq via INTRAVENOUS
  Filled 2022-03-02 (×2): qty 100

## 2022-03-02 NOTE — Progress Notes (Signed)
Patient currently receiving second platelet transfusion. Re-time AM labs to reflect post transfusion, and mag/potassium replacement so that values would be accurate.

## 2022-03-02 NOTE — H&P (Signed)
History and Physical    Patient: Juan [name] AJO:[id number] DOB: 03-Mar-1966 DOA: 03/01/2022 DOS: the patient was seen and examined on 03/02/2022 PCP: [name], DO  Patient coming from: Home  Chief Complaint:  Chief Complaint  Patient presents with   Hematochezia   HPI: Juan [name] is a 56 y.o. male with medical history significant of alcoholic cirrhosis, anxiety, depression, GERD, HTN, myopathy, hemachromatosis. Presenting with abdominal and rectal pain w/ bleeding. Symptoms started 2 days ago. He noticed sharp rectal pain and RLQ/LLQ cramping. He had BRBPR. He didn't have any fevers, but he did feel nauseous. No vomiting or diarrhea. He didn't try any therapies at home. Denies NSAID and EtOH use. When his symptoms didn't improve yesterday, he decided to come to the ED for assistance. He denies any other aggravating or alleviating factors.    Review of Systems: As mentioned in the history of present illness. All other systems reviewed and are negative. Past Medical History:  Diagnosis Date   Alcohol addiction ([place])    Anxiety    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HCC) 9/47/0962   Chronic fatigue    Cirrhosis (HCC)    Colon polyps    Depression    GERD (gastroesophageal reflux disease)    Hemochromatosis    Hx of blood clots    Leg   Hyperreflexia    Hypertension    PTSD (post-traumatic stress disorder)    Traumatic hemorrhagic shock [facility])    Past Surgical History:  Procedure Laterality Date   BIOPSY  09/24/2021   Procedure: BIOPSY;  Surgeon: Sharyn Creamer, MD;  Location: [facility];  Service: Gastroenterology;;   [place] RELEASE Bilateral    COLONOSCOPY WITH PROPOFOL N/A 09/24/2021   Procedure: COLONOSCOPY WITH PROPOFOL;  Surgeon: Sharyn Creamer, MD;  Location: [place];  Service: Gastroenterology;  Laterality: N/A;   ESOPHAGOGASTRODUODENOSCOPY (EGD) WITH PROPOFOL N/A 09/24/2021   Procedure: ESOPHAGOGASTRODUODENOSCOPY (EGD) WITH PROPOFOL;  Surgeon: Sharyn Creamer, MD;  Location: [place];  Service: Gastroenterology;  Laterality: N/A;   FRACTURE SURGERY     left ankle plate   HERNIA REPAIR     inguinal   KNEE SURGERY Right    x 4   SHOULDER SURGERY Bilateral    x 2   VASECTOMY     Social History:  reports that he has never smoked. His smokeless tobacco use includes snuff. He reports that he does not currently use alcohol. He reports that he does not use drugs.  Allergies  Allergen Reactions   Cucumber Extract Itching and Nausea And Vomiting    No extracts; just cucumber    Peanut Butter Flavor Anaphylaxis and Swelling   Peanut Oil Swelling   Shellfish Allergy Itching and Swelling   Cantaloupe Extract Allergy Skin Test Rash   Lactose Other (See Comments)    Lactose intolerant - causes indigestion   Strawberry Extract Nausea And Vomiting and Swelling   Vancomycin Rash   Apple Juice Swelling   Codeine Itching    itching   Depakote Er [Divalproex Sodium Er] Swelling    Tongue swelling    Family History  Problem Relation Age of Onset   Pulmonary fibrosis Mother    Hypertension Father    Other Father        liver failure   Diabetes Brother    Breast cancer Paternal Aunt    Colon cancer Neg Hx    Esophageal cancer Neg Hx    Stomach cancer Neg Hx    Rectal cancer  Neg Hx     Prior to Admission medications   Medication Sig Start Date End Date Taking? Authorizing Provider  acetaminophen (TYLENOL) 650 MG CR tablet Take 1,300 mg by mouth every 8 (eight) hours as needed for pain.    [provider]  aspirin 81 MG chewable tablet Chew 81 mg by mouth every morning.    [provider]  B Complex Vitamins (B COMPLEX PO) Take 1 tablet by mouth every morning.    [provider]  busPIRone (BUSPAR) 15 MG tablet Take 15 mg by mouth 2 (two) times daily. 08/27/21   [provider]  Cholecalciferol 50 MCG (2000 UT) CAPS Take 2,000 Units by mouth every morning.    [provider]  cyclobenzaprine  (FLEXERIL) 10 MG tablet Take 1/2 to 1 tablet by mouth at bedtime, then increase gradually to 1 tablet 3 times a day Patient taking differently: Take 10 mg by mouth 3 (three) times daily as needed for muscle spasms. 12/22/21   Silverio Decamp, MD  EPINEPHrine 0.3 mg/0.3 mL IJ SOAJ injection INJECT 0.3 MG INTO THE MUSCLE AS NEEDED FOR ANAPHYLAXIS. Patient taking differently: Inject 0.3 mg into the muscle once as needed for anaphylaxis (severe allergic reaction). 04/08/21 04/08/22  Hali Marry, MD  folic acid (FOLVITE) 1 MG tablet TAKE 1 TABLET (1 MG TOTAL) BY MOUTH DAILY. Patient taking differently: Take 1 mg by mouth every morning. 06/22/21 06/22/22  Silverio Decamp, MD  hydrOXYzine (VISTARIL) 25 MG capsule Take 1 capsule by mouth every 6 - 8 hours as needed for anxiety 02/25/22   [name], DO  Magnesium Chloride 64 MG TABS Take 2 tablets by mouth in the morning and at bedtime. Patient taking differently: Take 64 mg by mouth in the morning and at bedtime. 11/18/21   [name], DO  Multiple Vitamins-Minerals (YOUR LIFE MULTI ADULT GUMMIES) CHEW Chew 1 tablet by mouth daily.    [provider]  ondansetron (ZOFRAN-ODT) 8 MG disintegrating tablet Dissolve 1 tablet (8 mg total) by mouth every 8 (eight) hours as needed for nausea. 07/14/21   Terrilyn Saver, NP  pantoprazole (PROTONIX) 40 MG tablet Take 1 tablet (40 mg total) by mouth daily. 11/20/21 11/20/22  Sharyn Creamer, MD  potassium chloride SA (KLOR-CON M) 20 MEQ tablet Take 2 tablets (40 mEq total) by mouth 2 (two) times daily. Patient taking differently: Take 20 mEq by mouth every morning. 11/18/21 01/11/22  [name], DO  prazosin (MINIPRESS) 2 MG capsule Take 4 mg by mouth at bedtime. 05/28/21   [provider]  saccharomyces boulardii (FLORASTOR) 250 MG capsule Take 250 mg by mouth 2 (two) times daily.     [provider]  vortioxetine HBr (TRINTELLIX) 10 MG TABS tablet Take 1 tablet (10 mg  total) by mouth daily. 09/02/21   [name], DO    Physical Exam: Vitals:   03/02/22 0545 03/02/22 0634 03/02/22 0635 03/02/22 0703  BP: 131/75 103/70 103/70 109/64  Pulse: 69 70 73 71  Resp: 15 16 15 13   Temp: 98.3 F (36.8 C) 98.2 F (36.8 C) 98.2 F (36.8 C) 98.7 F (37.1 C)  TempSrc: Oral Oral Oral   SpO2: 92% 95% 94%   Weight:      Height:       General: 56 y.o. male resting in bed in NAD Eyes: PERRL, normal sclera ENMT: Nares patent w/o discharge, orophaynx clear, dentition normal, ears w/o discharge/lesions/ulcers; noted pallor Neck: Supple, trachea midline  Cardiovascular: RRR, +S1, S2, no g/r, 2/6 SEM, equal pulses throughout Respiratory: CTABL, no w/r/r, normal WOB GI: BS+, distended but soft, globally tender, no masses noted, no organomegaly noted MSK: No e/c/c Neuro: A&O x 3, no focal deficits Psyc: Appropriate interaction and affect, calm/cooperative  Data Reviewed:  Na+  132 K+  2.7 CO2  21 Glucose  93 BUN  10 SCr  0.71 Mg2+  0.8 Albumin  2.7 AST  93 ALT  23 T bili 3.2 Ind bili  2.0 Dir bili  1.2 WBC  6.5 Hgb  9.2 Plt  50 INR  1.5  CTA ab/pelvis VASCULAR No visible contrast extravasation within the colon or bowel elsewhere to localize GI bleed.   NON-VASCULAR Changes of cirrhosis with portal venous hypertension. Recanalized umbilical vein and spontaneous left splenorenal shunt. Mild perihepatic and right abdominal ascites.   Cholelithiasis.  Assessment and Plan: GIB Normocytic anemia Thrombocytopenia     - admitted to inpt, SDU     - imaging as above     - getting plt transfusion, trend     - q6h H&H, transfuse pRBCs for HGB < 7     - protonix     - [facility] onboard, appreciate assistance, will keep NPO until they eval  Cirrhosis Elevated LFTs     - imaging as above     - resume home regimen when off NPO status  Hyponatremia Hypokalemia Hypomagnesemia     - replace K+, Mg+     - Na+ mildly low,  follow  Anxiety Depression     - continue home regimen when off NPO status  GERD     - protonix  Tobacco abuse     - counsel against further use  Advance Care Planning:   Code Status: FULL  Consults: [facility]  Family Communication: w/ wife by phone  Severity of Illness: The appropriate patient status for this patient is INPATIENT. Inpatient status is judged to be reasonable and necessary in order to provide the required intensity of service to ensure the patient's safety. The patient's presenting symptoms, physical exam findings, and initial radiographic and laboratory data in the context of their chronic comorbidities is felt to place them at high risk for further clinical deterioration. Furthermore, it is not anticipated that the patient will be medically stable for discharge from the hospital within 2 midnights of admission.   * I certify that at the point of admission it is my clinical judgment that the patient will require inpatient hospital care spanning beyond 2 midnights from the point of admission due to high intensity of service, high risk for further deterioration and high frequency of surveillance required.*  Author: Jonnie Finner, DO 03/02/2022 7:35 AM  For on call review [email or web address].

## 2022-03-02 NOTE — Consult Note (Addendum)
Referring Provider: Dr. Cherylann Ratel  Primary Care Physician:  [name], DO Primary Gastroenterologist:  Dr. Dayna Barker   Reason for Consultation: Hematochezia, cirrhosis  HPI: Juan [name] is a 56 y.o. male with a past medical history of depression, hypertension, anxiety, PTSD, alcohol use disorder, alcohol associated cirrhosis, alcohol associated myopathy (wheelchair bound) , LLE DVT 2020, gallstones, hemochromatosis and GERD/esophagitis.  He was previously admitted to the hospital 1/27 - 09/24/2021 with worsening fatigue in the setting of testing positive for influenza B. CT showed evidence of cirrhosis with portal hypertension and gallstones.  He developed bright red rectal bleeding when he was evaluated by our GI service during his admission.  He underwent an EGD 09/24/2021 which showed grade C esophagitis, a medium size hiatal hernia, portal hypertensive gastropathy.  The colonoscopy identified nonbleeding internal hemorrhoids otherwise was normal.  He was seen by Dr. [name] for outpatient follow-up 10/27/2021.  At that time, he denied having any further hematochezia.  He was having issues with memory and was started on lactulose.  He remained on PPI twice daily. A repeat EGD was done 3/3 and 09/11/2021 which showed resolution of his esophagitis.  PPI was reduced to once daily.  He has not been seen in our outpatient office since then.  An abdominal/pelvic CTA 03/01/2022 without evidence of active GI bleeding.  He presented to the ED on 03/01/2022 due to having bright red rectal bleeding x 2 days with associated nausea and dizziness.  Labs in the ED showed a WBC count of 6.5.  Hemoglobin 9.2 (Hg 10.1 on 01/12/2022).  Hematocrit 27.3.  MCV 92.2.  Platelet 50.  Sodium 132.  Potassium 2.7.  BUN 10.  Creatinine 0.71.  Albumin 2.7.  Magnesium 0.8.  INR 1.5.  Total bili 3.2.  Direct bili 1.2.  Alk phos 73.  AST 93.  ALT 23.  FOBT positive.  On PPI infusion.  He reported passing 4 episodes of bright  red blood per rectum yesterday.  He passed a moderate to amount of bright red blood per rectum x  1 overnight and a smaller amount of similar blood without stool around 5:30am today.  He often strains to pass a bowel movement.  No diarrhea.  He complains of having abdominal bloat x 2 weeks with right mid abdominal pain for the past few days. No GERD symptoms.  He remains compliant with taking Pantoprazole 40 mg once daily.  He takes ASA 81 mg daily due to history of a LLE DVT following ankle surgery in 2020.  No other NSAID use.  He remains abstinent from alcohol for 313 days. He was admitted to a 36 day rehab facility in [place] for 36 days in 2020.  He previously drank 1/2 of a 5th of liquor daily since 1992.  He denies having any issues with memory or confusion.  He stopped taking the lactulose at least 2 weeks ago.  He reports a vague history of being diagnosed with hemochromatosis.  He denies ever having a liver biopsy.  He does not recall having phlebotomy treatment for this condition.  He denies ever seeing a hematologist or hepatologist.  Father died from alcohol associated cirrhosis.  Normal ceruloplasmin 08/2019.  Hepatitis A IgG, IgM, Hep B core IgG and IgM nonreactive 08/2019. Hep B surface antibody reactive, hep B surface antigen nonreactive 08/2019.  Alpha 1 antitrypsin 11/2019 " single M isoform Detective, in context of normal alpha 1 antitrypsin, this is consistent with MM phenotype".    Abdominal/pelvic CTA 03/01/2022:  IMPRESSION: VASCULAR   No visible contrast extravasation within the colon or bowel elsewhere to localize GI bleed.   NON-VASCULAR   Changes of cirrhosis with portal venous hypertension. Recanalized umbilical vein and spontaneous left splenorenal shunt. Mild perihepatic and right abdominal ascites.   Cholelithiasis.  Normal spleen.  PAST GI PROCEDURES:  EGD 11/20/2021: Normal esophagus. - Medium-sized hiatal hernia. - Portal hypertensive gastropathy. - Normal  examined duodenum. - No specimens collected. -PPI was decreased to once daily.    EGD 09/24/21: - LA Grade C esophagitis with no bleeding. Biopsied. - Medium-sized hiatal hernia. - Portal hypertensive gastropathy. - Erythematous mucosa in the antrum. Biopsied. - Normal examined duodenum. Path: . STOMACH, BIOPSY:  -  Reactive gastropathy with mild foveolar hyperplasia.  -  Suggestive of mild vascular ectasia, without thrombi.  -  Negative for an inflammatory infiltrate predictive of Helicobacter  pylori infection.  -  Negative for intestinal metaplasia.  -  Negative for malignancy.   B. ESOPHAGUS, BIOPSY:  -  Squamocolumnar mucosa with no specific pathologic diagnosis (scant  columnar epithelium present).  -  Negative for acute esophagitis and intrasquamous eosinophils.  -  Negative for intestinal metaplasia.  -  No viral cytopathic change or fungi identified (on HE).  -  Negative for dysplasia and malignancy  Colonoscopy 09/24/21: - The examined portion of the ileum was normal. - A tattoo was seen in the sigmoid colon. - Non-bleeding internal hemorrhoids.   09/2019 colonoscopy.  Unable to locate results.  Patient said he had what sounds like an adenomatous polyp.  Has not had follow-up colonoscopy.  Does not recall mention of diverticulosis or of UC.    09/2019 EGD.  Unable to locate results.  Pt reports diagnosis of GERD  09/2019 FibroScan.  Unable to locate results.   Past Medical History:  Diagnosis Date   Alcohol addiction ([place])    Anxiety    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[facility]) 4/31/5400   Chronic fatigue    Cirrhosis ([facility])    Colon polyps    Depression    GERD (gastroesophageal reflux disease)    Hemochromatosis    Hx of blood clots    Leg   Hyperreflexia    Hypertension    PTSD (post-traumatic stress disorder)    Traumatic hemorrhagic shock [facility])     Past Surgical History:  Procedure Laterality Date   BIOPSY  09/24/2021   Procedure: BIOPSY;  Surgeon: Sharyn Creamer, MD;  Location: [facility] ENDOSCOPY;  Service: Gastroenterology;;   Wilmon Pali RELEASE Bilateral    COLONOSCOPY WITH PROPOFOL N/A 09/24/2021   Procedure: COLONOSCOPY WITH PROPOFOL;  Surgeon: Sharyn Creamer, MD;  Location: [place];  Service: Gastroenterology;  Laterality: N/A;   ESOPHAGOGASTRODUODENOSCOPY (EGD) WITH PROPOFOL N/A 09/24/2021   Procedure: ESOPHAGOGASTRODUODENOSCOPY (EGD) WITH PROPOFOL;  Surgeon: Sharyn Creamer, MD;  Location: [place];  Service: Gastroenterology;  Laterality: N/A;   FRACTURE SURGERY     left ankle plate   HERNIA REPAIR     inguinal   KNEE SURGERY Right    x 4   SHOULDER SURGERY Bilateral    x 2   VASECTOMY      Prior to Admission medications   Medication Sig Start Date End Date Taking? Authorizing Provider  acetaminophen (TYLENOL) 650 MG CR tablet Take 1,300 mg by mouth every 8 (eight) hours as needed for pain.    [provider]  aspirin 81 MG chewable tablet Chew 81 mg by mouth every morning.    [provider]  B Complex Vitamins (B COMPLEX PO) Take 1 tablet by mouth every morning.    [provider]  busPIRone (BUSPAR) 15 MG tablet Take 15 mg by mouth 2 (two) times daily. 08/27/21   [provider]  Cholecalciferol 50 MCG (2000 UT) CAPS Take 2,000 Units by mouth every morning.    [provider]  cyclobenzaprine (FLEXERIL) 10 MG tablet Take 1/2 to 1 tablet by mouth at bedtime, then increase gradually to 1 tablet 3 times a day Patient taking differently: Take 10 mg by mouth 3 (three) times daily as needed for muscle spasms. 12/22/21   Silverio Decamp, MD  EPINEPHrine 0.3 mg/0.3 mL IJ SOAJ injection INJECT 0.3 MG INTO THE MUSCLE AS NEEDED FOR ANAPHYLAXIS. Patient taking differently: Inject 0.3 mg into the muscle once as needed for anaphylaxis (severe allergic reaction). 04/08/21 04/08/22  Hali Marry, MD  folic acid (FOLVITE) 1 MG tablet TAKE 1 TABLET (1 MG TOTAL) BY MOUTH DAILY. Patient  taking differently: Take 1 mg by mouth every morning. 06/22/21 06/22/22  Silverio Decamp, MD  hydrOXYzine (VISTARIL) 25 MG capsule Take 1 capsule by mouth every 6 - 8 hours as needed for anxiety 02/25/22   [name], DO  Magnesium Chloride 64 MG TABS Take 2 tablets by mouth in the morning and at bedtime. Patient taking differently: Take 64 mg by mouth in the morning and at bedtime. 11/18/21   [name], DO  Multiple Vitamins-Minerals (YOUR LIFE MULTI ADULT GUMMIES) CHEW Chew 1 tablet by mouth daily.    [provider]  ondansetron (ZOFRAN-ODT) 8 MG disintegrating tablet Dissolve 1 tablet (8 mg total) by mouth every 8 (eight) hours as needed for nausea. 07/14/21   Terrilyn Saver, NP  pantoprazole (PROTONIX) 40 MG tablet Take 1 tablet (40 mg total) by mouth daily. 11/20/21 11/20/22  Sharyn Creamer, MD  potassium chloride SA (KLOR-CON M) 20 MEQ tablet Take 2 tablets (40 mEq total) by mouth 2 (two) times daily. Patient taking differently: Take 20 mEq by mouth every morning. 11/18/21 01/11/22  [name], DO  prazosin (MINIPRESS) 2 MG capsule Take 4 mg by mouth at bedtime. 05/28/21   [provider]  saccharomyces boulardii (FLORASTOR) 250 MG capsule Take 250 mg by mouth 2 (two) times daily.     [provider]  vortioxetine HBr (TRINTELLIX) 10 MG TABS tablet Take 1 tablet (10 mg total) by mouth daily. 09/02/21   [name], DO    Current Facility-Administered Medications  Medication Dose Route Frequency Provider Last Rate Last Admin   0.9 %  sodium chloride infusion (Manually program via Guardrails IV Fluids)   Intravenous Once Opyd, Ilene Qua, MD       Chlorhexidine Gluconate Cloth 2 % PADS 6 each  6 each Topical Q2200 Kathryne Eriksson, NP   6 each at 03/02/22 0521   Oral care mouth rinse  15 mL Mouth Rinse PRN Doutova, Anastassia, MD       pantoprazole (PROTONIX) 40 MG injection            pantoprozole (PROTONIX) 80 mg /NS 100 mL infusion  8 mg/hr  Intravenous Continuous Roemhildt, Lorin T, PA-C 10 mL/hr at 03/02/22 0442 8 mg/hr at 03/02/22 0442    Allergies as of 03/01/2022 - Review Complete 03/01/2022  Allergen Reaction Noted   Cucumber extract Itching and Nausea And Vomiting 10/16/2013   Peanut butter flavor Anaphylaxis and Swelling 10/16/2013   Peanut oil Swelling 10/16/2013  Shellfish allergy Itching and Swelling 10/16/2013   Cantaloupe extract allergy skin test Rash 12/30/2017   Lactose Other (See Comments) 12/30/2017   Strawberry extract Nausea And Vomiting and Swelling 10/16/2013   Vancomycin Rash 12/29/2018   Apple juice Swelling 05/07/2021   Codeine Itching 11/10/2017   Depakote er [divalproex sodium er] Swelling 11/10/2017    Family History  Problem Relation Age of Onset   Pulmonary fibrosis Mother    Hypertension Father    Other Father        liver failure   Diabetes Brother    Breast cancer Paternal Aunt    Colon cancer Neg Hx    Esophageal cancer Neg Hx    Stomach cancer Neg Hx    Rectal cancer Neg Hx     Social History   Socioeconomic History   Marital status: Married    Spouse name: [name]   Number of children: 2   Years of education: Not on file   Highest education level: High school graduate  Occupational History    Comment: disability  Tobacco Use   Smoking status: Never   Smokeless tobacco: Current    Types: Snuff  Vaping Use   Vaping Use: Never used  Substance and Sexual Activity   Alcohol use: Not Currently   Drug use: Never   Sexual activity: Not on file  Other Topics Concern   Not on file  Social History Narrative   Lives with wife   Social Determinants of Health   Financial Resource Strain: Not on file  Food Insecurity: Not on file  Transportation Needs: Not on file  Physical Activity: Not on file  Stress: Not on file  Social Connections: Not on file  Intimate Partner Violence: Not on file    Review of Systems: Gen: Denies fever, sweats or chills. No weight loss.   CV: Denies chest pain, palpitations or edema. Resp: Denies cough, shortness of breath of hemoptysis.  GI: Denies heartburn, dysphagia, stomach or lower abdominal pain. No diarrhea or constipation. No rectal bleeding or melena.   GU : Denies urinary burning, blood in urine, increased urinary frequency or incontinence. MS: [place] injured his left leg 2 weeks ago.  Derm: Denies rash, itchiness, skin lesions or unhealing ulcers. Psych: + Anxiety, depression.  Heme: + Easy bruising.  Neuro: + Muscular dystrophy, wheelchair-bound but is able to stand briefly. Endo:  Denies any problems with DM, thyroid or adrenal function.  Physical Exam: Vital signs in last 24 hours: Temp:  [98.2 F (36.8 C)-99 F (37.2 C)] 98.7 F (37.1 C) (07/11 0703) Pulse Rate:  [68-96] 71 (07/11 0703) Resp:  [13-25] 13 (07/11 0703) BP: (103-145)/(64-87) 109/64 (07/11 0703) SpO2:  [90 %-98 %] 94 % (07/11 0635) Weight:  [73 kg-77.1 kg] 77.1 kg (07/11 0011) Last BM Date : 03/02/22 General:  Alert 56 year old male in no acute distress. Head:  Normocephalic and atraumatic. Eyes:  No scleral icterus. Conjunctiva pink. Ears:  Normal auditory acuity. Nose:  No deformity, discharge or lesions. Mouth:  Dentition intact. No ulcers or lesions.  Neck:  Supple. No lymphadenopathy or thyromegaly.  Lungs: Breath sounds clear throughout the Heart: Regular rate and rhythm. 2/6 systolic murmur. Abdomen: Mild distention.  Soft.  Mild tenderness to the right mid abdomen without rebound or guarding.? Hepatomegaly. No splenomegaly.  Small amount of ascites. Rectal: No external hemorrhoids or fissures.  Internal hemorrhoids palpated without prolapse.  Dried blood to the external anal area noted.  No blood in the rectal vault or  on the exam glove at this time.  Nursing tech present during exam. Musculoskeletal:  Symmetrical without gross deformities.  Pulses:  Normal pulses noted. Extremities:  Without clubbing or edema. LLE/pretib area  with diffuse ecchymosis. Neurologic:  Alert and  oriented x 4. No focal deficits.  Skin:  Intact without significant lesions or rashes. Psych:  Alert and cooperative. Normal mood and affect.  Intake/Output from previous day: 07/10 0701 - 07/11 0700 In: 773.6 [I.V.:90; Blood:279; IV Piggyback:404.6] Out: 350 [Urine:350] Intake/Output this shift: No intake/output data recorded.  Lab Results: Recent Labs    03/01/22 1518 03/02/22 0110  WBC 6.5  --   HGB 9.2* 9.7*  HCT 27.3* 30.1*  PLT 50*  --    BMET Recent Labs    03/01/22 1518  NA 132*  K 2.7*  CL 101  CO2 21*  GLUCOSE 93  BUN 10  CREATININE 0.71  CALCIUM 9.2   LFT Recent Labs    03/01/22 1518  PROT 6.2*  6.3*  ALBUMIN 2.7*  2.7*  AST 95*  93*  ALT 24  23  ALKPHOS 68  73  BILITOT 3.2*  3.2*  BILIDIR 1.2*  IBILI 2.0*   PT/INR Recent Labs    03/01/22 1518  LABPROT 17.7*  INR 1.5*   Hepatitis Panel No results for input(s): "HEPBSAG", "HCVAB", "HEPAIGM", "HEPBIGM" in the last 72 hours.    Studies/Results: CT Angio Abd/Pel W and/or Wo Contrast  Result Date: 03/01/2022 CLINICAL DATA:  Lower GI bleed.  Bright red blood per rectum. EXAM: CTA ABDOMEN AND PELVIS WITHOUT AND WITH CONTRAST TECHNIQUE: Multidetector CT imaging of the abdomen and pelvis was performed using the standard protocol during bolus administration of intravenous contrast. Multiplanar reconstructed images and MIPs were obtained and reviewed to evaluate the vascular anatomy. RADIATION DOSE REDUCTION: This exam was performed according to the departmental dose-optimization program which includes automated exposure control, adjustment of the mA and/or kV according to patient size and/or use of iterative reconstruction technique. CONTRAST:  119m OMNIPAQUE IOHEXOL 350 MG/ML SOLN COMPARISON:  09/18/2021 FINDINGS: VASCULAR Aorta: Normal caliber aorta without aneurysm, dissection, vasculitis or significant stenosis. Celiac: Patent without evidence  of aneurysm, dissection, vasculitis or significant stenosis. SMA: Patent without evidence of aneurysm, dissection, vasculitis or significant stenosis. Renals: Both renal arteries are patent without evidence of aneurysm, dissection, vasculitis, fibromuscular dysplasia or significant stenosis. IMA: Patent without evidence of aneurysm, dissection, vasculitis or significant stenosis. Inflow: Patent without evidence of aneurysm, dissection, vasculitis or significant stenosis. Proximal Outflow: Bilateral common femoral and visualized portions of the superficial and profunda femoral arteries are patent without evidence of aneurysm, dissection, vasculitis or significant stenosis. Veins: Recanalized umbilical vein. 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 Review of the MIP images confirms the above findings. NON-VASCULAR Lower chest: No acute abnormality. Hepatobiliary: Changes of cirrhosis with nodular contours. No focal hepatic abnormality. Small gallstones layering within the gallbladder. Pancreas: No focal abnormality or ductal dilatation. Spleen: No focal abnormality.  Normal size. Adrenals/Urinary Tract: No adrenal abnormality. No focal renal abnormality. No stones or hydronephrosis. Urinary bladder is unremarkable. Stomach/Bowel: Stomach, large and small bowel grossly unremarkable. No contrast extravasation to localize GI bleed. Lymphatic: No adenopathy Reproductive: No visible focal abnormality. Other: Mild ascites adjacent to the liver and in the right paracolic gutter. Musculoskeletal: No acute bony abnormality. IMPRESSION: VASCULAR No visible contrast extravasation within the colon or bowel elsewhere to localize GI bleed. NON-VASCULAR Changes of cirrhosis with portal venous hypertension. Recanalized umbilical vein and  spontaneous left splenorenal shunt. Mild perihepatic and right abdominal ascites. Cholelithiasis. Electronically Signed   By: Rolm Baptise M.D.   On: 03/01/2022 20:06     IMPRESSION/PLAN:  56) 56 year old male with  admitted to the hospital with abdominal bloat, right mid to RLQ abdominal pain, hematochezia and anemia. Patient reported having 2 episodes of bright red blood per the rectum earlier this am.  Rectal exam identified internal hemorrhoid without active bleeding at this time.  Colonoscopy 09/2021 showed internal hemorrhoids, no polyps or rectal varices. Admission Hg 9.2 -> 9.7.  Hemodynamically stable. -Monitor H/H closely  -Transfuse for Hg <  7 -Anusol HC suppository 1 PR QHS x 3 nights -Rocephin 1gm IV Q 24 hours for infection prophylaxis in the setting of GI bleed with cirrhosis -No plans for endoscopic evaluation at this time, await further recommendations per Dr. Ardis Hughs  2) Alcohol associated cirrhosis.  Total bili 3.2.  Alk phos 73.  AST 93.  ALT 23.  Albumin 2.7.  MELD 15. MELD Na+ 19. CTA showed evidence of cirrhosis with portal hypertension, recanalized umbilical vein with a spontaneous left splenorenal shunt with mild ascites.  No overt hepatic encephalopathy.  Abstinent from alcohol for 313 days. -Unlikely enough ascites for diagnostic paracentesis -Monitor neuro status closely as he is at risk for hepatic encephalopathy with splenorenal shunt -Continue lifetime alcohol cessation -Follow-up with Dr. [name] as an outpatient, consider evaluation at [place] [facility]  3) Thrombocytopenia, secondary to liver disease. No splenomegaly.  PLT 50. Transfused 2 units of platelets. Repeat CBC pending.  4) Coagulopathy secondary to cirrhosis. INR 1.5. -INR in am  5) Questionable history of hemochromatosis -iron and ferritin level   6) GERD. 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  Repeat EGD 10/2021 showed resolution of esophagitis. -Change PPI to IV twice daily  7) Constipation -MiraLAX nightly  8) Hyponatremia, hypokalemia and hypomagnesia -Management per  the hospitalist   Noralyn Pick  03/02/2022, 09:47AM   _______________________________________________________________________________________________________________________________________  Velora Heckler GI MD note:  I personally examined the patient, reviewed the data and agree with the assessment and plan described above.  I provided a substantive portion of the care of this patient (personally provided more than half of the total time dedicated to the treatment of this patient).  56 yo man with etoh cirrhosis, admitted with BRBPR. Hb around his usual, CT angio negative for acute bleeding, he's been HD stable.  He underwent EGD and colonoscopy in the past 3-4 months. Seems like lower GI bleeding, probably internal hemorrhoids given physical exam and recent colonsocopy findings.  We have ordered topical hemorrhoidal therapy and I am putting him back on once daily oral PPI, have ordered heart healthy diet. I agree with empiric abx given his cirrhosis mild ascites and acute GI bleed. We will follow along.  thanks   Owens Loffler, MD [facility] Gastroenterology Pager [phone]

## 2022-03-03 ENCOUNTER — Inpatient Hospital Stay (HOSPITAL_COMMUNITY): Payer: No Typology Code available for payment source

## 2022-03-03 DIAGNOSIS — K922 Gastrointestinal hemorrhage, unspecified: Secondary | ICD-10-CM | POA: Diagnosis not present

## 2022-03-03 LAB — COMPREHENSIVE METABOLIC PANEL
ALT: 25 U/L (ref 0–44)
AST: 85 U/L — ABNORMAL HIGH (ref 15–41)
Albumin: 2.9 g/dL — ABNORMAL LOW (ref 3.5–5.0)
Alkaline Phosphatase: 81 U/L (ref 38–126)
Anion gap: 9 (ref 5–15)
BUN: 12 mg/dL (ref 6–20)
CO2: 23 mmol/L (ref 22–32)
Calcium: 8 mg/dL — ABNORMAL LOW (ref 8.9–10.3)
Chloride: 103 mmol/L (ref 98–111)
Creatinine, Ser: 0.87 mg/dL (ref 0.61–1.24)
GFR, Estimated: >60 mL/min (ref >60)
Glucose, Bld: 116 mg/dL — ABNORMAL HIGH (ref 70–99)
Potassium: 3.2 mmol/L — ABNORMAL LOW (ref 3.5–5.1)
Sodium: 135 mmol/L (ref 135–145)
Total Bilirubin: 3.5 mg/dL — ABNORMAL HIGH (ref 0.3–1.2)
Total Protein: 6.7 g/dL (ref 6.5–8.1)

## 2022-03-03 LAB — BPAM PLATELET PHERESIS
Blood Product Expiration Date: 202307132359
Blood Product Expiration Date: 202307132359
ISSUE DATE / TIME: 202307110521
ISSUE DATE / TIME: 202307110642
Unit Type and Rh: 6200
Unit Type and Rh: 7300

## 2022-03-03 LAB — PREPARE PLATELET PHERESIS
Unit division: 0
Unit division: 0

## 2022-03-03 LAB — CBC
HCT: 31.8 % — ABNORMAL LOW (ref 39.0–52.0)
Hemoglobin: 10.1 g/dL — ABNORMAL LOW (ref 13.0–17.0)
MCH: 30.5 pg (ref 26.0–34.0)
MCHC: 31.8 g/dL (ref 30.0–36.0)
MCV: 96.1 fL (ref 80.0–100.0)
Platelets: 76 10*3/uL — ABNORMAL LOW (ref 150–400)
RBC: 3.31 MIL/uL — ABNORMAL LOW (ref 4.22–5.81)
RDW: 18.4 % — ABNORMAL HIGH (ref 11.5–15.5)
WBC: 8 10*3/uL (ref 4.0–10.5)
nRBC: 0 % (ref 0.0–0.2)

## 2022-03-03 LAB — MAGNESIUM: Magnesium: 2 mg/dL (ref 1.7–2.4)

## 2022-03-03 LAB — AMMONIA: Ammonia: 41 umol/L — ABNORMAL HIGH (ref 9–35)

## 2022-03-03 LAB — PROTIME-INR
INR: 1.7 — ABNORMAL HIGH (ref 0.8–1.2)
Prothrombin Time: 19.9 seconds — ABNORMAL HIGH (ref 11.4–15.2)

## 2022-03-03 MED ORDER — SACCHAROMYCES BOULARDII 250 MG PO CAPS
250.0000 mg | ORAL_CAPSULE | Freq: Two times a day (BID) | ORAL | Status: DC
Start: 2022-03-03 — End: 2022-03-04
  Administered 2022-03-03 – 2022-03-04 (×3): 250 mg via ORAL
  Filled 2022-03-03 (×3): qty 1

## 2022-03-03 MED ORDER — POTASSIUM CHLORIDE CRYS ER 20 MEQ PO TBCR
40.0000 meq | EXTENDED_RELEASE_TABLET | Freq: Once | ORAL | Status: AC
  Administered 2022-03-03: 40 meq via ORAL
  Filled 2022-03-03: qty 2

## 2022-03-03 MED ORDER — FOLIC ACID 1 MG PO TABS
1.0000 mg | ORAL_TABLET | Freq: Every day | ORAL | Status: DC
  Administered 2022-03-03 – 2022-03-04 (×2): 1 mg via ORAL
  Filled 2022-03-03 (×2): qty 1

## 2022-03-03 MED ORDER — BUSPIRONE HCL 10 MG PO TABS
15.0000 mg | ORAL_TABLET | Freq: Two times a day (BID) | ORAL | Status: DC
  Administered 2022-03-03 – 2022-03-04 (×3): 15 mg via ORAL
  Filled 2022-03-03 (×3): qty 1

## 2022-03-03 NOTE — Progress Notes (Addendum)
[facility] Progress Note  CC:  Hematochezia, cirrhosis    Subjective: No further rectal bleeding since early yesterday morning.  He received Anusol suppository x1 yesterday.  No BM.  No further abdominal pain.  No nausea or vomiting.  No chest pain or shortness of breath.   Objective:  Vital signs in last 24 hours: Temp:  [97.7 F (36.5 C)-98.7 F (37.1 C)] 98.3 F (36.8 C) (07/12 0800) Pulse Rate:  [70-87] 71 (07/12 0800) Resp:  [12-21] 13 (07/12 0800) BP: (117-131)/(61-80) 121/61 (07/12 0800) SpO2:  [88 %-99 %] 92 % (07/12 0800) Last BM Date : 03/02/22  General: 56 year old male in no acute distress Heart: Regular rate and rhythm, 2/6 systolic murmur. Pulm: Breath sounds clear with few crackles to the left base. Abdomen: Abdomen soft with gaseous distention + small amount of ascites to the upper abdomen.  Nontender.  No palpable mass.  Positive bowel sounds to all 4 quadrants. Extremities: No lower extremity edema. Neurologic:  Alert and  oriented x 4. Grossly normal neurologically. Psych:  Alert and cooperative. Normal mood and affect.  Lab Results: Recent Labs    03/01/22 1518 03/02/22 0110 03/02/22 0917 03/02/22 1237 03/03/22 0319  WBC 6.5  --  7.0  --  8.0  HGB 9.2*   < > 9.6* 9.5* 10.1*  HCT 27.3*   < > 29.3* 28.7* 31.8*  PLT 50*  --  71*  --  76*   < > = values in this interval not displayed.   BMET Recent Labs    03/01/22 1518 03/02/22 0917 03/03/22 0319  NA 132* 136 135  K 2.7* 3.4* 3.2*  CL 101 105 103  CO2 21* 23 23  GLUCOSE 93 87 116*  BUN _0 CREATININE 0.71 0.70 0.87  CALCIUM 9.2 8.5* 8.0*   LFT Recent Labs    03/01/22 1518 03/03/22 0319  PROT 6.2*  6.3* 6.7  ALBUMIN 2.7*  2.7* 2.9*  AST 95*  93* 85*  ALT _1 ALKPHOS 68  73 81  BILITOT 3.2*  3.2* 3.5*  BILIDIR 1.2*  --   IBILI 2.0*  --    PT/INR Recent Labs    03/01/22 1518 03/03/22 0319  LABPROT 17.7* 19.9*  INR 1.5* 1.7*    Hepatitis Panel No results for input(s): "HEPBSAG", "HCVAB", "HEPAIGM", "HEPBIGM" in the last 72 hours.  CT Angio Abd/Pel W and/or Wo Contrast  Result Date: 03/01/2022 CLINICAL DATA:  Lower GI bleed.  Bright red blood per rectum. EXAM: CTA ABDOMEN AND PELVIS WITHOUT AND WITH CONTRAST TECHNIQUE: Multidetector CT imaging of the abdomen and pelvis was performed using the standard protocol during bolus administration of intravenous contrast. Multiplanar reconstructed images and MIPs were obtained and reviewed to evaluate the vascular anatomy. RADIATION DOSE REDUCTION: This exam was performed according to the departmental dose-optimization program which includes automated exposure control, adjustment of the mA and/or kV according to patient size and/or use of iterative reconstruction technique. CONTRAST:  18m OMNIPAQUE IOHEXOL 350 MG/ML SOLN COMPARISON:  09/18/2021 FINDINGS: VASCULAR Aorta: Normal caliber aorta without aneurysm, dissection, vasculitis or significant stenosis. Celiac: Patent without evidence of aneurysm, dissection, vasculitis or significant stenosis. SMA: Patent without evidence of aneurysm, dissection, vasculitis or significant stenosis. Renals: Both renal arteries are patent without evidence of aneurysm, dissection, vasculitis, fibromuscular dysplasia or significant stenosis. IMA: Patent without evidence of aneurysm, dissection, vasculitis or significant stenosis. Inflow: Patent without evidence of aneurysm, dissection, vasculitis or significant stenosis.  Proximal Outflow: Bilateral common femoral and visualized portions of the superficial and profunda femoral arteries are patent without evidence of aneurysm, dissection, vasculitis or significant stenosis. Veins: Recanalized umbilical vein. 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 Review of the MIP images confirms the above findings. NON-VASCULAR Lower chest: No acute abnormality. Hepatobiliary: Changes of  cirrhosis with nodular contours. No focal hepatic abnormality. Small gallstones layering within the gallbladder. Pancreas: No focal abnormality or ductal dilatation. Spleen: No focal abnormality.  Normal size. Adrenals/Urinary Tract: No adrenal abnormality. No focal renal abnormality. No stones or hydronephrosis. Urinary bladder is unremarkable. Stomach/Bowel: Stomach, large and small bowel grossly unremarkable. No contrast extravasation to localize GI bleed. Lymphatic: No adenopathy Reproductive: No visible focal abnormality. Other: Mild ascites adjacent to the liver and in the right paracolic gutter. Musculoskeletal: No acute bony abnormality. IMPRESSION: VASCULAR No visible contrast extravasation within the colon or bowel elsewhere to localize GI bleed. NON-VASCULAR Changes of cirrhosis with portal venous hypertension. Recanalized umbilical vein and spontaneous left splenorenal shunt. Mild perihepatic and right abdominal ascites. Cholelithiasis. Electronically Signed   By: Rolm Baptise M.D.   On: 03/01/2022 20:06    Assessment / Plan:  62) 56 year old male with  admitted to the hospital with abdominal bloat, right mid to RLQ abdominal pain, hematochezia and anemia. Patient reported having 2 episodes of bright red blood per the rectum on 7/11. Rectal exam identified internal hemorrhoid without active bleeding. Colonoscopy 09/2021 showed internal hemorrhoids, no polyps or rectal varices. Admission Hg 9.2 -> 9.7 -> today Hg 10.1.  No further rectal bleeding since yesterday morning.  Increased abdominal distention on exam today.  Hemodynamically stable. -Monitor H/H closely  -Transfuse for Hg <  7 -Anusol HC suppository 1 PR QHS x 3 nights, day # 2 -Rocephin 1gm IV Q 24 hours for infection prophylaxis in the setting of GI bleed with cirrhosis, day #2 -Abdominal x-ray to rule out constipation, gaseous distention -No plans for endoscopic evaluation at this time, await further recommendations per Dr. Ardis Hughs    2) Alcohol associated cirrhosis.  Total bili 3.2.  Alk phos 81.  AST 85.  ALT 25.  Albumin 2.9.  INR 1.7.  Normal renal function.  MELD 15. MELD Na+ 19. CTA showed evidence of cirrhosis with portal hypertension, recanalized umbilical vein with a spontaneous left splenorenal shunt with mild ascites.  No overt hepatic encephalopathy.  Abstinent from alcohol for 314 days.  -Unlikely enough ascites for diagnostic paracentesis.  Upper abdomen is mildly more distended today,  -Monitor neuro status closely as he is at risk for hepatic encephalopathy with splenorenal shunt -Continue lifetime alcohol cessation -2 g low-sodium diet -Follow-up with Dr. [name] as an outpatient, consider evaluation at [facility]   3) Thrombocytopenia, secondary to liver disease. No splenomegaly.  PLT 50. Transfused 2 units of platelets. PLT 76.    4) Coagulopathy secondary to cirrhosis. INR 1.5 -> 1.7. -INR in am   5) Questionable history of hemochromatosis. Iron 77. Ferritin 36.   6) GERD. 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  Repeat EGD 10/2021 showed resolution of esophagitis. -Continue Pantoprazole 40 mg daily   7) Constipation -Miralax Q HS as needed  8) Hyponatremia, hypokalemia and hypomagnesia -Management per the hospitalist      LOS: 2 days   Noralyn Pick  03/03/2022, 11:10AM  _______________________________________________________________________________________________________________________________________  Velora Heckler GI MD note:  I personally examined the patient, reviewed the data and agree  with the assessment and plan described above.  I provided a substantive portion of the care of this patient (personally provided more than half of the total time dedicated to the treatment of this patient).  He's had no further GI bleeding and Hb is up a bit today.  Should observe another 24 hours for  rebleeding, continue topical therapy to the hemorrhoids which are the most likely source.  He's complained of increased abd fullness, KUB today showed non-dilated bowel pattern.  CT scan 2 days ago showed very minimal ascites, I reviewed the images again today. There is clearly not enough to be causing him any symptoms and not enough to safely tap. No need for [place].  We will follow along.     Owens Loffler, MD [facility] Pager [phone]

## 2022-03-03 NOTE — Progress Notes (Signed)
PROGRESS NOTE    Juan [name]  UMP:[id number] DOB: 1966/05/23 DOA: 03/01/2022 PCP: [name], DO    Brief Narrative:  Juan [name] is a 56 y.o. male with medical history significant of alcoholic cirrhosis, anxiety, depression, GERD, HTN, myopathy, hemachromatosis. Presenting with abdominal and rectal pain w/ bleeding. Symptoms started 2 days ago. He noticed sharp rectal pain and RLQ/LLQ cramping. He had BRBPR.   Assessment and Plan:  GIB- thought to be from hemorrhoids  Normocytic anemia Thrombocytopenia     - s/p plt transfusion, trend     - h/h stable     - protonix     - LBGI onboard- no plan for procedure   Cirrhosis/Elevated LFTs     - imaging as above   Hyponatremia Hypokalemia Hypomagnesemia     - replace    Anxiety Depression     - resume home meds   GERD     - protonix   Tobacco abuse     - counsel against further use    DVT prophylaxis: SCDs Start: 03/02/22 0859    Code Status: Full Code Family Communication:   Disposition Plan:  Level of care: Med-Surg Status is: Inpatient Remains inpatient appropriate because: await GI signed off    Consultants:  GI   Subjective: No complaints  Objective: Vitals:   03/02/22 2300 03/03/22 0000 03/03/22 0329 03/03/22 0800  BP:  117/70  121/61  Pulse:  82  71  Resp:  19  13  Temp: 97.7 F (36.5 C)  98.1 F (36.7 C) 98.3 F (36.8 C)  TempSrc: Oral  Oral Oral  SpO2:  98%  92%  Weight:      Height:        Intake/Output Summary (Last 24 hours) at 03/03/2022 1119 Last data filed at 03/03/2022 1058 Gross per 24 hour  Intake 952.5 ml  Output 1450 ml  Net -497.5 ml   Filed Weights   03/01/22 1446 03/02/22 0011  Weight: 73 kg 77.1 kg    Examination:   General: Appearance:    Obese male in no acute distress     Lungs:     Clear to auscultation bilaterally  Heart:    Normal heart rate. Normal rhythm.    MS:   All extremities are intact.    Neurologic:   Awake, alert       Data  Reviewed: I have personally reviewed following labs and imaging studies  CBC: Recent Labs  Lab 03/01/22 1518 03/02/22 0110 03/02/22 0917 03/02/22 1237 03/03/22 0319  WBC 6.5  --  7.0  --  8.0  NEUTROABS  --   --  4.7  --   --   HGB 9.2* 9.7* 9.6* 9.5* 10.1*  HCT 27.3* 30.1* 29.3* 28.7* 31.8*  MCV 92.2  --  94.5  --  96.1  PLT 50*  --  71*  --  76*   Basic Metabolic Panel: Recent Labs  Lab 03/01/22 1518 03/02/22 0917 03/03/22 0319 03/03/22 0455  NA 132* 136 135  --   K 2.7* 3.4* 3.2*  --   CL 101 105 103  --   CO2 21* 23 23  --   GLUCOSE 93 87 116*  --   BUN 10 11 12   --   CREATININE 0.71 0.70 0.87  --   CALCIUM 9.2 8.5* 8.0*  --   MG 0.8* 1.8  --  2.0  PHOS 2.8  --   --   --    GFR:  Estimated Creatinine Clearance: 89.2 mL/min (by C-G formula based on SCr of 0.87 mg/dL). Liver Function Tests: Recent Labs  Lab 03/01/22 1518 03/03/22 0319  AST 95*  93* 85*  ALT 24  23 25   ALKPHOS 68  73 81  BILITOT 3.2*  3.2* 3.5*  PROT 6.2*  6.3* 6.7  ALBUMIN 2.7*  2.7* 2.9*   No results for input(s): "LIPASE", "AMYLASE" in the last 168 hours. Recent Labs  Lab 03/03/22 0319  AMMONIA 41*   Coagulation Profile: Recent Labs  Lab 03/01/22 1518 03/03/22 0319  INR 1.5* 1.7*   Cardiac Enzymes: No results for input(s): "CKTOTAL", "CKMB", "CKMBINDEX", "TROPONINI" in the last 168 hours. BNP (last 3 results) No results for input(s): "PROBNP" in the last 8760 hours. HbA1C: No results for input(s): "HGBA1C" in the last 72 hours. CBG: No results for input(s): "GLUCAP" in the last 168 hours. Lipid Profile: No results for input(s): "CHOL", "HDL", "LDLCALC", "TRIG", "CHOLHDL", "LDLDIRECT" in the last 72 hours. Thyroid Function Tests: No results for input(s): "TSH", "T4TOTAL", "FREET4", "T3FREE", "THYROIDAB" in the last 72 hours. Anemia Panel: Recent Labs    03/02/22 1237  FERRITIN 36  IRON 77   Sepsis Labs: No results for input(s): "PROCALCITON", "LATICACIDVEN" in  the last 168 hours.  Recent Results (from the past 240 hour(s))  MRSA Next Gen by PCR, Nasal     Status: None   Collection Time: 03/02/22 12:48 AM   Specimen: Nasal Mucosa; Nasal Swab  Result Value Ref Range Status   MRSA by PCR Next Gen NOT DETECTED NOT DETECTED Final    Comment: (NOTE) The GeneXpert MRSA Assay (FDA approved for NASAL specimens only), is one component of a comprehensive MRSA colonization surveillance program. It is not intended to diagnose MRSA infection nor to guide or monitor treatment for MRSA infections. Test performance is not FDA approved in patients less than 76 years old. Performed at [facility], [place] [address] [place], [place] [address]          Radiology Studies: CT Angio Abd/Pel W and/or Wo Contrast  Result Date: 03/01/2022 CLINICAL DATA:  Lower GI bleed.  Bright red blood per rectum. EXAM: CTA ABDOMEN AND PELVIS WITHOUT AND WITH CONTRAST TECHNIQUE: Multidetector CT imaging of the abdomen and pelvis was performed using the standard protocol during bolus administration of intravenous contrast. Multiplanar reconstructed images and MIPs were obtained and reviewed to evaluate the vascular anatomy. RADIATION DOSE REDUCTION: This exam was performed according to the departmental dose-optimization program which includes automated exposure control, adjustment of the mA and/or kV according to patient size and/or use of iterative reconstruction technique. CONTRAST:  173m OMNIPAQUE IOHEXOL 350 MG/ML SOLN COMPARISON:  09/18/2021 FINDINGS: VASCULAR Aorta: Normal caliber aorta without aneurysm, dissection, vasculitis or significant stenosis. Celiac: Patent without evidence of aneurysm, dissection, vasculitis or significant stenosis. SMA: Patent without evidence of aneurysm, dissection, vasculitis or significant stenosis. Renals: Both renal arteries are patent without evidence of aneurysm, dissection, vasculitis, fibromuscular dysplasia or significant  stenosis. IMA: Patent without evidence of aneurysm, dissection, vasculitis or significant stenosis. Inflow: Patent without evidence of aneurysm, dissection, vasculitis or significant stenosis. Proximal Outflow: Bilateral common femoral and visualized portions of the superficial and profunda femoral arteries are patent without evidence of aneurysm, dissection, vasculitis or significant stenosis. Veins: Recanalized umbilical vein. 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 Review of the MIP images confirms the above findings. NON-VASCULAR Lower chest: No acute abnormality. Hepatobiliary: Changes of cirrhosis with nodular contours. No focal  hepatic abnormality. Small gallstones layering within the gallbladder. Pancreas: No focal abnormality or ductal dilatation. Spleen: No focal abnormality.  Normal size. Adrenals/Urinary Tract: No adrenal abnormality. No focal renal abnormality. No stones or hydronephrosis. Urinary bladder is unremarkable. Stomach/Bowel: Stomach, large and small bowel grossly unremarkable. No contrast extravasation to localize GI bleed. Lymphatic: No adenopathy Reproductive: No visible focal abnormality. Other: Mild ascites adjacent to the liver and in the right paracolic gutter. Musculoskeletal: No acute bony abnormality. IMPRESSION: VASCULAR No visible contrast extravasation within the colon or bowel elsewhere to localize GI bleed. NON-VASCULAR Changes of cirrhosis with portal venous hypertension. Recanalized umbilical vein and spontaneous left splenorenal shunt. Mild perihepatic and right abdominal ascites. Cholelithiasis. Electronically Signed   By: Rolm Baptise M.D.   On: 03/01/2022 20:06        Scheduled Meds:  sodium chloride   Intravenous Once   Chlorhexidine Gluconate Cloth  6 each Topical Q2200   hydrocortisone  25 mg Rectal QHS   pantoprazole  40 mg Oral QAC breakfast   Continuous Infusions:  cefTRIAXone (ROCEPHIN)  IV Stopped (03/03/22 0959)      LOS: 2 days    Time spent: 45 minutes spent on chart review, discussion with nursing staff, consultants, updating family and interview/physical exam; more than 50% of that time was spent in counseling and/or coordination of care.    Geradine Girt, DO [facility] Available via Epic secure chat 7am-7pm After these hours, please refer to coverage provider listed on [email or web address] 03/03/2022, 11:19 AM

## 2022-03-04 ENCOUNTER — Other Ambulatory Visit (HOSPITAL_BASED_OUTPATIENT_CLINIC_OR_DEPARTMENT_OTHER): Payer: Self-pay

## 2022-03-04 ENCOUNTER — Telehealth: Payer: Self-pay

## 2022-03-04 ENCOUNTER — Other Ambulatory Visit (HOSPITAL_COMMUNITY): Payer: Self-pay

## 2022-03-04 DIAGNOSIS — K7030 Alcoholic cirrhosis of liver without ascites: Secondary | ICD-10-CM | POA: Diagnosis not present

## 2022-03-04 DIAGNOSIS — R7989 Other specified abnormal findings of blood chemistry: Secondary | ICD-10-CM

## 2022-03-04 DIAGNOSIS — K922 Gastrointestinal hemorrhage, unspecified: Secondary | ICD-10-CM | POA: Diagnosis not present

## 2022-03-04 LAB — BASIC METABOLIC PANEL
Anion gap: 8 (ref 5–15)
BUN: 11 mg/dL (ref 6–20)
CO2: 25 mmol/L (ref 22–32)
Calcium: 7.6 mg/dL — ABNORMAL LOW (ref 8.9–10.3)
Chloride: 104 mmol/L (ref 98–111)
Creatinine, Ser: 0.64 mg/dL (ref 0.61–1.24)
GFR, Estimated: >60 mL/min (ref >60)
Glucose, Bld: 140 mg/dL — ABNORMAL HIGH (ref 70–99)
Potassium: 3.3 mmol/L — ABNORMAL LOW (ref 3.5–5.1)
Sodium: 137 mmol/L (ref 135–145)

## 2022-03-04 LAB — PROTIME-INR
INR: 1.7 — ABNORMAL HIGH (ref 0.8–1.2)
Prothrombin Time: 19.6 seconds — ABNORMAL HIGH (ref 11.4–15.2)

## 2022-03-04 LAB — C DIFFICILE (CDIFF) QUICK SCRN (NO PCR REFLEX)
C Diff antigen: NEGATIVE
C Diff interpretation: NOT DETECTED
C Diff toxin: NEGATIVE

## 2022-03-04 LAB — CBC
HCT: 28.3 % — ABNORMAL LOW (ref 39.0–52.0)
Hemoglobin: 9 g/dL — ABNORMAL LOW (ref 13.0–17.0)
MCH: 30.9 pg (ref 26.0–34.0)
MCHC: 31.8 g/dL (ref 30.0–36.0)
MCV: 97.3 fL (ref 80.0–100.0)
Platelets: 68 10*3/uL — ABNORMAL LOW (ref 150–400)
RBC: 2.91 MIL/uL — ABNORMAL LOW (ref 4.22–5.81)
RDW: 18.4 % — ABNORMAL HIGH (ref 11.5–15.5)
WBC: 7 10*3/uL (ref 4.0–10.5)
nRBC: 0 % (ref 0.0–0.2)

## 2022-03-04 MED ORDER — POTASSIUM CHLORIDE CRYS ER 20 MEQ PO TBCR
40.0000 meq | EXTENDED_RELEASE_TABLET | Freq: Once | ORAL | Status: AC
  Administered 2022-03-04: 40 meq via ORAL
  Filled 2022-03-04: qty 2

## 2022-03-04 MED ORDER — HYDROCORTISONE ACETATE 25 MG RE SUPP
25.0000 mg | Freq: Every day | RECTAL | 0 refills | Status: DC
  Filled 2022-03-04 (×2): qty 12, 12d supply, fill #0

## 2022-03-04 MED ORDER — LOPERAMIDE HCL 2 MG PO CAPS: 2.0000 mg | ORAL_CAPSULE | ORAL | Status: DC | PRN

## 2022-03-04 MED ORDER — POTASSIUM CHLORIDE CRYS ER 20 MEQ PO TBCR: 20.0000 meq | EXTENDED_RELEASE_TABLET | Freq: Every day | ORAL | Status: DC

## 2022-03-04 MED ORDER — ALUM & MAG HYDROXIDE-SIMETH 200-200-20 MG/5ML PO SUSP
30.0000 mL | ORAL | Status: DC | PRN
  Administered 2022-03-04: 30 mL via ORAL
  Filled 2022-03-04: qty 30

## 2022-03-04 MED ORDER — LOPERAMIDE HCL 2 MG PO CAPS: 2.0000 mg | ORAL_CAPSULE | ORAL | 0 refills | Status: DC | PRN

## 2022-03-04 NOTE — Progress Notes (Signed)
  Transition of Care [facility]) Screening Note   Patient Details  Name: Nam Vossler Date of Birth: 03/02/1966   Transition of Care [facility]) CM/SW Contact:    Vassie Moselle, LCSW Phone Number: 03/04/2022, 8:30 AM    Transition of Care Department [facility]) has reviewed patient and no TOC needs have been identified at this time. We will continue to monitor patient advancement through interdisciplinary progression rounds. If new patient transition needs arise, please place a TOC consult.

## 2022-03-04 NOTE — Consult Note (Signed)
   [facility] Inpatient Consult   03/04/2022  Juan [name] 1966-06-10 [id number]   [place] Organization [ACO] Patient: [facility] Employee Plan  Primary Care Provider:  [name], DO at [facility]  *Remote coverage review, patient at [facility], patient transitioned home   Insurance plan: Referral for [facility] plan for transition of care post hospital support/follow up. No other needs noted at this time.     Plan: Patient will be assigned for follow up.   For additional questions or referrals please contact:   Natividad Brood, RN BSN [facility] Liaison  [phone] business mobile phone Toll free office [phone]  Fax number: [phone] [email or web address]

## 2022-03-04 NOTE — Progress Notes (Signed)
PROGRESS NOTE    Juan [name]  GYK:[id number] DOB: [birth year] DOA: 03/01/2022 PCP: [name], DO    Brief Narrative:  Juan [name] is a 56 y.o. male with medical history significant of alcoholic cirrhosis, anxiety, depression, GERD, HTN, myopathy, hemachromatosis. Presenting with abdominal and rectal pain w/ bleeding. Symptoms started 2 days ago. He noticed sharp rectal pain and RLQ/LLQ cramping. He had BRBPR.   Assessment and Plan:  GIB- thought to be from hemorrhoids  Normocytic anemia Thrombocytopenia     - s/p plt transfusion, trend     - h/h stable     - protonix     - LBGI onboard- no plan for procedure  Diarrhea -r/o c diff   Cirrhosis/Elevated LFTs     - imaging as above   Hyponatremia Hypokalemia Hypomagnesemia     - replace    Anxiety Depression     - resume home meds   GERD     - protonix   Tobacco abuse     - counsel against further use    DVT prophylaxis: SCDs Start: 03/02/22 0859    Code Status: Full Code Family Communication:   Disposition Plan:  Level of care: Med-Surg Status is: Inpatient Remains inpatient appropriate because: await GI     Consultants:  GI   Subjective: Having diarrhea/loose stools  Objective: Vitals:   03/03/22 1700 03/03/22 1857 03/03/22 2152 03/04/22 0356  BP: (!) 97/52 115/76 105/73 117/75  Pulse: 74 86 83 76  Resp: (!) 21 19 17 18   Temp:  98.3 F (36.8 C) 98 F (36.7 C) 97.8 F (36.6 C)  TempSrc:  Oral    SpO2: 96% 99% 96% 98%  Weight:      Height:        Intake/Output Summary (Last 24 hours) at 03/04/2022 1218 Last data filed at 03/04/2022 1038 Gross per 24 hour  Intake 920 ml  Output 350 ml  Net 570 ml   Filed Weights   03/01/22 1446 03/02/22 0011  Weight: 73 kg 77.1 kg    Examination:    General: Appearance:    Obese male in no acute distress     Lungs:      respirations unlabored  Heart:    Normal heart rate.   MS:   All extremities are intact.   Neurologic:   Awake,  alert, oriented x 3. No apparent focal neurological           defect.          Data Reviewed: I have personally reviewed following labs and imaging studies  CBC: Recent Labs  Lab 03/01/22 1518 03/02/22 0110 03/02/22 0917 03/02/22 1237 03/03/22 0319 03/04/22 0540  WBC 6.5  --  7.0  --  8.0 7.0  NEUTROABS  --   --  4.7  --   --   --   HGB 9.2* 9.7* 9.6* 9.5* 10.1* 9.0*  HCT 27.3* 30.1* 29.3* 28.7* 31.8* 28.3*  MCV 92.2  --  94.5  --  96.1 97.3  PLT 50*  --  71*  --  76* 68*   Basic Metabolic Panel: Recent Labs  Lab 03/01/22 1518 03/02/22 0917 03/03/22 0319 03/03/22 0455 03/04/22 0540  NA 132* 136 135  --  137  K 2.7* 3.4* 3.2*  --  3.3*  CL 101 105 103  --  104  CO2 21* 23 23  --  25  GLUCOSE 93 87 116*  --  140*  BUN 10 11 12   --  11  CREATININE 0.71 0.70 0.87  --  0.64  CALCIUM 9.2 8.5* 8.0*  --  7.6*  MG 0.8* 1.8  --  2.0  --   PHOS 2.8  --   --   --   --    GFR: Estimated Creatinine Clearance: 97 mL/min (by C-G formula based on SCr of 0.64 mg/dL). Liver Function Tests: Recent Labs  Lab 03/01/22 1518 03/03/22 0319  AST 95*  93* 85*  ALT 24  23 25   ALKPHOS 68  73 81  BILITOT 3.2*  3.2* 3.5*  PROT 6.2*  6.3* 6.7  ALBUMIN 2.7*  2.7* 2.9*   No results for input(s): "LIPASE", "AMYLASE" in the last 168 hours. Recent Labs  Lab 03/03/22 0319  AMMONIA 41*   Coagulation Profile: Recent Labs  Lab 03/01/22 1518 03/03/22 0319 03/04/22 0543  INR 1.5* 1.7* 1.7*   Cardiac Enzymes: No results for input(s): "CKTOTAL", "CKMB", "CKMBINDEX", "TROPONINI" in the last 168 hours. BNP (last 3 results) No results for input(s): "PROBNP" in the last 8760 hours. HbA1C: No results for input(s): "HGBA1C" in the last 72 hours. CBG: No results for input(s): "GLUCAP" in the last 168 hours. Lipid Profile: No results for input(s): "CHOL", "HDL", "LDLCALC", "TRIG", "CHOLHDL", "LDLDIRECT" in the last 72 hours. Thyroid Function Tests: No results for input(s): "TSH",  "T4TOTAL", "FREET4", "T3FREE", "THYROIDAB" in the last 72 hours. Anemia Panel: Recent Labs    03/02/22 1237  FERRITIN 36  IRON 77   Sepsis Labs: No results for input(s): "PROCALCITON", "LATICACIDVEN" in the last 168 hours.  Recent Results (from the past 240 hour(s))  MRSA Next Gen by PCR, Nasal     Status: None   Collection Time: 03/02/22 12:48 AM   Specimen: Nasal Mucosa; Nasal Swab  Result Value Ref Range Status   MRSA by PCR Next Gen NOT DETECTED NOT DETECTED Final    Comment: (NOTE) The GeneXpert MRSA Assay (FDA approved for NASAL specimens only), is one component of a comprehensive MRSA colonization surveillance program. It is not intended to diagnose MRSA infection nor to guide or monitor treatment for MRSA infections. Test performance is not FDA approved in patients less than 21 years old. Performed at [facility], [place] [address] [place], [place] [address]          Radiology Studies: DG Abd Portable 1V  Result Date: 03/03/2022 CLINICAL DATA:  A 56 year old male presents for evaluation of abdominal distension. EXAM: PORTABLE ABDOMEN - 1 VIEW COMPARISON:  CT a of the abdomen and pelvis which was performed on July 10th 2023. FINDINGS: EKG leads project over the lower chest and upper abdomen. Lung bases are clear. Bowel gas pattern without signs of obstruction. Small amount of rectal gas. On limited assessment no acute regional skeletal findings. Soft tissues are grossly normal. IMPRESSION: Nonobstructive bowel gas pattern.  No acute findings. Electronically Signed   By: Zetta Bills M.D.   On: 03/03/2022 12:34        Scheduled Meds:  sodium chloride   Intravenous Once   busPIRone  15 mg Oral BID   Chlorhexidine Gluconate Cloth  6 each Topical [id number]   folic acid  1 mg Oral Daily   hydrocortisone  25 mg Rectal QHS   pantoprazole  40 mg Oral QAC breakfast   saccharomyces boulardii  250 mg Oral BID   Continuous Infusions:     LOS: 3 days     Time spent: 45 minutes spent on chart review, discussion with nursing  staff, consultants, updating family and interview/physical exam; more than 50% of that time was spent in counseling and/or coordination of care.    Geradine Girt, DO [facility] Available via Epic secure chat 7am-7pm After these hours, please refer to coverage provider listed on [email or web address] 03/04/2022, 12:18 PM

## 2022-03-04 NOTE — Discharge Summary (Signed)
Physician Discharge Summary  Juan [name] UQJ:[id number] DOB: Aug 29, 1965 DOA: 03/01/2022  PCP: Juan Nutting, DO  Admit date: 03/01/2022 Discharge date: 03/04/2022  Admitted From: home Discharge disposition: home   Recommendations for Outpatient Follow-Up:   Cbc/bmp 1 week   Discharge Diagnosis:   Principal Problem:   Lower GI bleed Active Problems:   Hypokalemia   Hypomagnesemia   Thrombocytopenia (HCC)   MDD (major depressive disorder), recurrent severe, without psychosis (HCC)   GAD (generalized anxiety disorder)   Cirrhosis of liver (HCC)   Hyponatremia   GERD (gastroesophageal reflux disease)   Normocytic anemia   Elevated LFTs   Tobacco abuse    Discharge Condition: Improved.  Diet recommendation: Low sodium, heart healthy  Wound care: None.  Code status: Full.   History of Present Illness:   Juan [name] is a 56 y.o. male with medical history significant of alcoholic cirrhosis, anxiety, depression, GERD, HTN, myopathy, hemachromatosis. Presenting with abdominal and rectal pain w/ bleeding. Symptoms started 2 days ago. He noticed sharp rectal pain and RLQ/LLQ cramping. He had BRBPR. He didn't have any fevers, but he did feel nauseous. No vomiting or diarrhea. He didn't try any therapies at home. Denies NSAID and EtOH use. When his symptoms didn't improve yesterday, he decided to come to the ED for assistance. He denies any other aggravating or alleviating factors.   Hospital Course by Problem:   GIB- thought to be from hemorrhoids  Normocytic anemia Thrombocytopenia     - s/p plt transfusion, trend     - h/h stable     - protonix     - LBGI onboard- no plan for procedure   Diarrhea -c diff negative -imodium   Cirrhosis/Elevated LFTs     - imaging as above   Hyponatremia Hypokalemia Hypomagnesemia     - replace    Anxiety Depression     - resume home meds   GERD     - protonix   Tobacco abuse     - counsel against  further use    Medical Consultants:   GI   Discharge Exam:   Vitals:   03/03/22 2152 03/04/22 0356  BP: 105/73 117/75  Pulse: 83 76  Resp: 17 18  Temp: 98 F (36.7 C) 97.8 F (36.6 C)  SpO2: 96% 98%   Vitals:   03/03/22 1700 03/03/22 1857 03/03/22 2152 03/04/22 0356  BP: (!) 97/52 115/76 105/73 117/75  Pulse: 74 86 83 76  Resp: (!) 21 19 17 18   Temp:  98.3 F (36.8 C) 98 F (36.7 C) 97.8 F (36.6 C)  TempSrc:  Oral    SpO2: 96% 99% 96% 98%  Weight:      Height:        General exam: Appears calm and comfortable.    The results of significant diagnostics from this hospitalization (including imaging, microbiology, ancillary and laboratory) are listed below for reference.     Procedures and Diagnostic Studies:   CT Angio Abd/Pel W and/or Wo Contrast  Result Date: 03/01/2022 CLINICAL DATA:  Lower GI bleed.  Bright red blood per rectum. EXAM: CTA ABDOMEN AND PELVIS WITHOUT AND WITH CONTRAST TECHNIQUE: Multidetector CT imaging of the abdomen and pelvis was performed using the standard protocol during bolus administration of intravenous contrast. Multiplanar reconstructed images and MIPs were obtained and reviewed to evaluate the vascular anatomy. RADIATION DOSE REDUCTION: This exam was performed according to the departmental dose-optimization program which includes automated exposure control, adjustment of  the mA and/or kV according to patient size and/or use of iterative reconstruction technique. CONTRAST:  156m OMNIPAQUE IOHEXOL 350 MG/ML SOLN COMPARISON:  09/18/2021 FINDINGS: VASCULAR Aorta: Normal caliber aorta without aneurysm, dissection, vasculitis or significant stenosis. Celiac: Patent without evidence of aneurysm, dissection, vasculitis or significant stenosis. SMA: Patent without evidence of aneurysm, dissection, vasculitis or significant stenosis. Renals: Both renal arteries are patent without evidence of aneurysm, dissection, vasculitis, fibromuscular dysplasia or  significant stenosis. IMA: Patent without evidence of aneurysm, dissection, vasculitis or significant stenosis. Inflow: Patent without evidence of aneurysm, dissection, vasculitis or significant stenosis. Proximal Outflow: Bilateral common femoral and visualized portions of the superficial and profunda femoral arteries are patent without evidence of aneurysm, dissection, vasculitis or significant stenosis. Veins: Recanalized umbilical vein. 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 Review of the MIP images confirms the above findings. NON-VASCULAR Lower chest: No acute abnormality. Hepatobiliary: Changes of cirrhosis with nodular contours. No focal hepatic abnormality. Small gallstones layering within the gallbladder. Pancreas: No focal abnormality or ductal dilatation. Spleen: No focal abnormality.  Normal size. Adrenals/Urinary Tract: No adrenal abnormality. No focal renal abnormality. No stones or hydronephrosis. Urinary bladder is unremarkable. Stomach/Bowel: Stomach, large and small bowel grossly unremarkable. No contrast extravasation to localize GI bleed. Lymphatic: No adenopathy Reproductive: No visible focal abnormality. Other: Mild ascites adjacent to the liver and in the right paracolic gutter. Musculoskeletal: No acute bony abnormality. IMPRESSION: VASCULAR No visible contrast extravasation within the colon or bowel elsewhere to localize GI bleed. NON-VASCULAR Changes of cirrhosis with portal venous hypertension. Recanalized umbilical vein and spontaneous left splenorenal shunt. Mild perihepatic and right abdominal ascites. Cholelithiasis. Electronically Signed   By: KRolm BaptiseM.D.   On: 03/01/2022 20:06     Labs:   Basic Metabolic Panel: Recent Labs  Lab 03/01/22 1518 03/02/22 0917 03/03/22 0319 03/03/22 0455 03/04/22 0540  NA 132* 136 135  --  137  K 2.7* 3.4* 3.2*  --  3.3*  CL 101 105 103  --  104  CO2 21* 23 23  --  25  GLUCOSE 93 87 116*  --  140*   BUN 10 11 12   --  11  CREATININE 0.71 0.70 0.87  --  0.64  CALCIUM 9.2 8.5* 8.0*  --  7.6*  MG 0.8* 1.8  --  2.0  --   PHOS 2.8  --   --   --   --    GFR Estimated Creatinine Clearance: 97 mL/min (by C-G formula based on SCr of 0.64 mg/dL). Liver Function Tests: Recent Labs  Lab 03/01/22 1518 03/03/22 0319  AST 95*  93* 85*  ALT 24  23 25   ALKPHOS 68  73 81  BILITOT 3.2*  3.2* 3.5*  PROT 6.2*  6.3* 6.7  ALBUMIN 2.7*  2.7* 2.9*   No results for input(s): "LIPASE", "AMYLASE" in the last 168 hours. Recent Labs  Lab 03/03/22 0319  AMMONIA 41*   Coagulation profile Recent Labs  Lab 03/01/22 1518 03/03/22 0319 03/04/22 0543  INR 1.5* 1.7* 1.7*    CBC: Recent Labs  Lab 03/01/22 1518 03/02/22 0110 03/02/22 0917 03/02/22 1237 03/03/22 0319 03/04/22 0540  WBC 6.5  --  7.0  --  8.0 7.0  NEUTROABS  --   --  4.7  --   --   --   HGB 9.2* 9.7* 9.6* 9.5* 10.1* 9.0*  HCT 27.3* 30.1* 29.3* 28.7* 31.8* 28.3*  MCV 92.2  --  94.5  --  96.1 97.3  PLT 50*  --  71*  --  76* 68*   Cardiac Enzymes: No results for input(s): "CKTOTAL", "CKMB", "CKMBINDEX", "TROPONINI" in the last 168 hours. BNP: Invalid input(s): "POCBNP" CBG: No results for input(s): "GLUCAP" in the last 168 hours. D-Dimer No results for input(s): "DDIMER" in the last 72 hours. Hgb A1c No results for input(s): "HGBA1C" in the last 72 hours. Lipid Profile No results for input(s): "CHOL", "HDL", "LDLCALC", "TRIG", "CHOLHDL", "LDLDIRECT" in the last 72 hours. Thyroid function studies No results for input(s): "TSH", "T4TOTAL", "T3FREE", "THYROIDAB" in the last 72 hours.  Invalid input(s): "FREET3" Anemia work up Recent Labs    03/02/22 1237  FERRITIN 36  IRON 77   Microbiology Recent Results (from the past 240 hour(s))  MRSA Next Gen by PCR, Nasal     Status: None   Collection Time: 03/02/22 12:48 AM   Specimen: Nasal Mucosa; Nasal Swab  Result Value Ref Range Status   MRSA by PCR Next Gen NOT  DETECTED NOT DETECTED Final    Comment: (NOTE) The GeneXpert MRSA Assay (FDA approved for NASAL specimens only), is one component of a comprehensive MRSA colonization surveillance program. It is not intended to diagnose MRSA infection nor to guide or monitor treatment for MRSA infections. Test performance is not FDA approved in patients less than 72 years old. Performed at [facility], [place] [address], [place], [place] [address]   C Difficile Quick Screen (NO PCR Reflex)     Status: None   Collection Time: 03/04/22 11:47 AM   Specimen: STOOL  Result Value Ref Range Status   C Diff antigen NEGATIVE NEGATIVE Final   C Diff toxin NEGATIVE NEGATIVE Final   C Diff interpretation No C. difficile detected.  Final    Comment: Performed at [facility], [place] [address] [place], [place] [address]     Discharge Instructions:   Discharge Instructions     Diet - low sodium heart healthy   Complete by: As directed    Increase activity slowly   Complete by: As directed       Allergies as of 03/04/2022       Reactions   Cucumber Extract Itching, Nausea And Vomiting   No extracts; just cucumber   Peanut Butter Flavor Anaphylaxis, Swelling   Peanut Oil Swelling   Shellfish Allergy Itching, Swelling   Cantaloupe Extract Allergy Skin Test Rash   Lactose Other (See Comments)   Lactose intolerant - causes indigestion Stomach pain/gas   Strawberry Extract Nausea And Vomiting, Swelling   Vancomycin Rash   Apple Juice Swelling   Swelling  of tongue   Depakote Er [divalproex Sodium Er] Swelling   Tongue swelling   Codeine Itching, Rash        Medication List     STOP taking these medications    aspirin 81 MG chewable tablet   cyclobenzaprine 10 MG tablet Commonly known as: FLEXERIL   EPINEPHrine 0.3 mg/0.3 mL Soaj injection Commonly known as: EPI-PEN   hydrOXYzine 25 MG capsule Commonly known as: Vistaril   ibuprofen 200 MG  tablet Commonly known as: ADVIL   prazosin 2 MG capsule Commonly known as: MINIPRESS       TAKE these medications    acetaminophen 650 MG CR tablet Commonly known as: TYLENOL Take 650 mg by mouth daily as needed for pain.   B COMPLEX PO Take 1 tablet by mouth every morning.   busPIRone 15 MG tablet Commonly  known as: BUSPAR Take 15 mg by mouth 2 (two) times daily.   Cholecalciferol 50 MCG (2000 UT) Caps Take 4,000 Units by mouth daily.   folic acid 1 MG tablet Commonly known as: FOLVITE TAKE 1 TABLET (1 MG TOTAL) BY MOUTH DAILY. What changed: how much to take   hydrocortisone 25 MG suppository Commonly known as: ANUSOL-HC Place 1 suppository (25 mg total) rectally at bedtime.   loperamide 2 MG capsule Commonly known as: IMODIUM Take 1 capsule (2 mg total) by mouth as needed for diarrhea or loose stools.   Magnesium Chloride 64 MG Tabs Take 2 tablets by mouth in the morning and at bedtime. What changed:  how much to take when to take this   multivitamin capsule Take 2 capsules by mouth daily.   ondansetron 8 MG disintegrating tablet Commonly known as: ZOFRAN-ODT Dissolve 1 tablet (8 mg total) by mouth every 8 (eight) hours as needed for nausea. What changed: when to take this   pantoprazole 40 MG tablet Commonly known as: PROTONIX Take 1 tablet (40 mg total) by mouth daily.   potassium chloride SA 20 MEQ tablet Commonly known as: KLOR-CON M Take 1 tablet (20 mEq total) by mouth daily.   saccharomyces boulardii 250 MG capsule Commonly known as: FLORASTOR Take 250 mg by mouth 2 (two) times daily.   Trintellix 10 MG Tabs tablet Generic drug: vortioxetine HBr Take 1 tablet (10 mg total) by mouth daily.        Follow-up Information     Juan Nutting, DO Follow up in 1 week(s).   Specialty: Family Medicine Why: [place] information: [place] [id number] [phone]         Sharyn Creamer, MD  Follow up.   Specialty: Gastroenterology Contact information: [place] [address] [phone]                  Time coordinating discharge: 45 min  Signed:  Geradine Girt DO  [facility] 03/04/2022, 1:37 PM

## 2022-03-04 NOTE — Progress Notes (Addendum)
[facility] Progress Note  CC:  Hematochezia, cirrhosis  Subjective: No abdominal pain.  He passed 6 nonbloody solid to loose stools last night and x 2 this morning. History of C. Diff which required hospital admission at [facility] about 5 years ago.  He stated he is allergic to Vancomycin.    Objective:  Vital signs in last 24 hours: Temp:  [97.8 F (36.6 C)-98.3 F (36.8 C)] 97.8 F (36.6 C) (07/13 0356) Pulse Rate:  [74-87] 76 (07/13 0356) Resp:  [17-21] 18 (07/13 0356) BP: (97-119)/(52-76) 117/75 (07/13 0356) SpO2:  [96 %-99 %] 98 % (07/13 0356) Last BM Date : 03/04/22 General:   Alert, well-developed in NAD Heart: Regular rate and rhythm, 2/6 systolic murmur. Pulm: Breath sounds clear throughout Abdomen: Mild upper abdominal distention, nontender.  Suspect a small amount of ascites.  Positive bowel sounds to all 4 quadrants. Extremities:  Without edema. Neurologic:  Alert and  oriented x 4. Grossly normal neurologically. Psych:  Alert and cooperative. Normal mood and affect.  Lab Results: Recent Labs    03/02/22 0917 03/02/22 1237 03/03/22 0319 03/04/22 0540  WBC 7.0  --  8.0 7.0  HGB 9.6* 9.5* 10.1* 9.0*  HCT 29.3* 28.7* 31.8* 28.3*  PLT 71*  --  76* 68*   BMET Recent Labs    03/02/22 0917 03/03/22 0319 03/04/22 0540  NA 136 135 137  K 3.4* 3.2* 3.3*  CL 105 103 104  CO2 23 23 25   GLUCOSE 87 116* 140*  BUN 11 12 11   CREATININE 0.70 0.87 0.64  CALCIUM 8.5* 8.0* 7.6*   LFT Recent Labs    03/01/22 1518 03/03/22 0319  PROT 6.2*  6.3* 6.7  ALBUMIN 2.7*  2.7* 2.9*  AST 95*  93* 85*  ALT 24  23 25   ALKPHOS 68  73 81  BILITOT 3.2*  3.2* 3.5*  BILIDIR 1.2*  --   IBILI 2.0*  --    PT/INR Recent Labs    03/03/22 0319 03/04/22 0543  LABPROT 19.9* 19.6*  INR 1.7* 1.7*   Assessment / Plan:  58) 56 year old male with  admitted to the hospital with abdominal bloat, right mid to RLQ abdominal pain, hematochezia and  anemia. Patient reported having 2 episodes of bright red blood per the rectum on 7/11. Rectal exam identified internal hemorrhoid without active bleeding. Colonoscopy 09/2021 showed internal hemorrhoids, no polyps or rectal varices. Admission Hg 9.2 -> 9.7 -> Hg 10.1 -> today Hg 9.0.  No further hematochezia. -CBC in am -Anusol HC suppository at bedtime, day # 3 -No plans for endoscopic evaluation at this time   2) Alcohol associated cirrhosis.  Total bili 3.2.  Alk phos 81.  AST 85.  ALT 25.  Albumin 2.9.  INR 1.7.  Normal renal function.  MELD 15. MELD Na+ 19. CTA showed evidence of cirrhosis with portal hypertension, recanalized umbilical vein with a spontaneous left splenorenal shunt with mild ascites.  No overt hepatic encephalopathy.  Ammonia 41. Abstinent from alcohol for 314 days.  -Not enough ascites for diagnostic paracentesis -Monitor neuro status closely as he is at risk for hepatic encephalopathy with splenorenal shunt -Continue lifetime alcohol cessation -2 g low-sodium diet -Follow-up with Dr. [name] as an outpatient, consider evaluation at [facility] -Further recommendations per Dr. Ardis Hughs   3) Thrombocytopenia, secondary to liver disease. No splenomegaly. Transfused 2 units of platelets on 7/11. PLT 68.    4) Coagulopathy secondary to cirrhosis. INR 1.5 ->  1.7.   5) Questionable history of hemochromatosis. Iron 77. Ferritin 36.   6) GERD. 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  Repeat EGD 10/2021 showed resolution of esophagitis. -Continue Pantoprazole 40 mg daily  7) History of C. difficile colitis which required hospital admission approximately 5 years ago, patient reported allergic response "red-face" with Vancomycin.  He passed 6 nonbloody solid to soft and loose stools overnight and x 2 this morning.   -Stop Rocephin -Florastor 1 p.o. twice daily -Will check C. difficile PCR  if next bowel movement loose or watery    8) Hyponatremia, hypokalemia, hypocalcemia and hypomagnesia -Management per the hospitalist     LOS: 3 days   Noralyn Pick  03/04/2022, 9:19 AM   _______________________________________________________________________________________________________________________________________  Velora Heckler GI MD note:  I personally examined the patient, reviewed the data and agree with the assessment and plan described above.  I provided a substantive portion of the care of this patient (personally provided more than half of the total time dedicated to the treatment of this patient).  HE has not had bleeding in 2-3 days now.  He should continue topical care for his hemorrhoids for another week at home.  He has had loose stools and has a h/o remote C. Diff. His stools are being tested for C. Diff now.  I don't think he needs to reamin in the hospital for those results but certainly if they come back + he will need to be contacted and started on appropriate abx.  I suspect his loose stools are abx related.    He is safe to d/c home from my perspective. We will arrange follow up in our office in the next several weeks with Dr. [name].   Owens Loffler, MD [facility] Pager [phone]

## 2022-03-04 NOTE — Progress Notes (Signed)
Discharge instructions reviewed with patient utilizing teach back method. Patient discharged to home

## 2022-03-04 NOTE — Telephone Encounter (Signed)
Left message for patient to return call to further discuss follow up appointment with Dr Frazier Butt follow up/cirrhosis.  Will continue efforts.

## 2022-03-04 NOTE — Telephone Encounter (Signed)
-----   Message from Milus Banister, MD sent at 03/04/2022 12:41 PM EDT ----- He needs follow up with [name] in next several weeks for cirrhosis follow up.  Thanks

## 2022-03-05 NOTE — Telephone Encounter (Signed)
Patient aware that he has been scheduled to follow up with Dr [name] on 04-19-22 at 10:50am.  Patient agreed to plan and verbalized understanding.  No further questions.

## 2022-03-09 ENCOUNTER — Other Ambulatory Visit (HOSPITAL_COMMUNITY): Payer: Self-pay

## 2022-03-09 ENCOUNTER — Other Ambulatory Visit: Payer: Self-pay | Admitting: Family Medicine

## 2022-03-09 DIAGNOSIS — K922 Gastrointestinal hemorrhage, unspecified: Secondary | ICD-10-CM

## 2022-03-09 DIAGNOSIS — K7030 Alcoholic cirrhosis of liver without ascites: Secondary | ICD-10-CM | POA: Diagnosis not present

## 2022-03-10 LAB — CBC WITH DIFFERENTIAL/PLATELET
Absolute Monocytes: 986 cells/uL — ABNORMAL HIGH (ref 200–950)
Basophils Absolute: 73 cells/uL (ref 0–200)
Basophils Relative: 1 %
Eosinophils Absolute: 314 cells/uL (ref 15–500)
Eosinophils Relative: 4.3 %
HCT: 31.7 % — ABNORMAL LOW (ref 38.5–50.0)
Hemoglobin: 10.7 g/dL — ABNORMAL LOW (ref 13.2–17.1)
Lymphs Abs: 1431 cells/uL (ref 850–3900)
MCH: 30.7 pg (ref 27.0–33.0)
MCHC: 33.8 g/dL (ref 32.0–36.0)
MCV: 91.1 fL (ref 80.0–100.0)
MPV: 9.6 fL (ref 7.5–12.5)
Monocytes Relative: 13.5 %
Neutro Abs: 4497 cells/uL (ref 1500–7800)
Neutrophils Relative %: 61.6 %
Platelets: 96 10*3/uL — ABNORMAL LOW (ref 140–400)
RBC: 3.48 10*6/uL — ABNORMAL LOW (ref 4.20–5.80)
RDW: 16.6 % — ABNORMAL HIGH (ref 11.0–15.0)
Total Lymphocyte: 19.6 %
WBC: 7.3 10*3/uL (ref 3.8–10.8)

## 2022-03-10 LAB — COMPLETE METABOLIC PANEL WITH GFR
AG Ratio: 1 (calc) (ref 1.0–2.5)
ALT: 27 U/L (ref 9–46)
AST: 92 U/L — ABNORMAL HIGH (ref 10–35)
Albumin: 3.2 g/dL — ABNORMAL LOW (ref 3.6–5.1)
Alkaline phosphatase (APISO): 84 U/L (ref 35–144)
BUN: 10 mg/dL (ref 7–25)
CO2: 26 mmol/L (ref 20–32)
Calcium: 8.3 mg/dL — ABNORMAL LOW (ref 8.6–10.3)
Chloride: 102 mmol/L (ref 98–110)
Creat: 0.8 mg/dL (ref 0.70–1.30)
Globulin: 3.2 g/dL (calc) (ref 1.9–3.7)
Glucose, Bld: 111 mg/dL — ABNORMAL HIGH (ref 65–99)
Potassium: 3.3 mmol/L — ABNORMAL LOW (ref 3.5–5.3)
Sodium: 138 mmol/L (ref 135–146)
Total Bilirubin: 3.9 mg/dL — ABNORMAL HIGH (ref 0.2–1.2)
Total Protein: 6.4 g/dL (ref 6.1–8.1)
eGFR: 105 mL/min/{1.73_m2} (ref >=60)

## 2022-03-10 LAB — AMMONIA: Ammonia: 46 umol/L (ref <=72)

## 2022-03-15 ENCOUNTER — Encounter: Payer: Self-pay | Admitting: Internal Medicine

## 2022-03-15 DIAGNOSIS — Z760 Encounter for issue of repeat prescription: Secondary | ICD-10-CM | POA: Diagnosis not present

## 2022-03-16 ENCOUNTER — Other Ambulatory Visit (HOSPITAL_BASED_OUTPATIENT_CLINIC_OR_DEPARTMENT_OTHER): Payer: Self-pay

## 2022-03-25 ENCOUNTER — Encounter: Payer: Non-veteran care | Admitting: Family Medicine

## 2022-04-09 ENCOUNTER — Other Ambulatory Visit (HOSPITAL_BASED_OUTPATIENT_CLINIC_OR_DEPARTMENT_OTHER): Payer: Self-pay

## 2022-04-09 ENCOUNTER — Encounter: Payer: Self-pay | Admitting: Family Medicine

## 2022-04-09 ENCOUNTER — Ambulatory Visit (INDEPENDENT_AMBULATORY_CARE_PROVIDER_SITE_OTHER): Payer: 59 | Admitting: Family Medicine

## 2022-04-09 VITALS — BP 121/69 | HR 84 | Ht 64.0 in | Wt 168.5 lb

## 2022-04-09 DIAGNOSIS — Z13220 Encounter for screening for lipoid disorders: Secondary | ICD-10-CM | POA: Diagnosis not present

## 2022-04-09 DIAGNOSIS — Z0000 Encounter for general adult medical examination without abnormal findings: Secondary | ICD-10-CM

## 2022-04-09 DIAGNOSIS — K7030 Alcoholic cirrhosis of liver without ascites: Secondary | ICD-10-CM | POA: Diagnosis not present

## 2022-04-09 DIAGNOSIS — Z125 Encounter for screening for malignant neoplasm of prostate: Secondary | ICD-10-CM | POA: Diagnosis not present

## 2022-04-09 DIAGNOSIS — S0990XA Unspecified injury of head, initial encounter: Secondary | ICD-10-CM

## 2022-04-09 DIAGNOSIS — K766 Portal hypertension: Secondary | ICD-10-CM

## 2022-04-09 DIAGNOSIS — I10 Essential (primary) hypertension: Secondary | ICD-10-CM

## 2022-04-09 DIAGNOSIS — R112 Nausea with vomiting, unspecified: Secondary | ICD-10-CM | POA: Diagnosis not present

## 2022-04-09 MED ORDER — ONDANSETRON 8 MG PO TBDP
8.0000 mg | ORAL_TABLET | Freq: Three times a day (TID) | ORAL | 1 refills | Status: DC | PRN
  Filled 2022-04-09: qty 20, 7d supply, fill #0
  Filled 2022-06-25: qty 20, 7d supply, fill #1

## 2022-04-09 MED ORDER — MUPIROCIN 2 % EX OINT
1.0000 | TOPICAL_OINTMENT | Freq: Two times a day (BID) | CUTANEOUS | 0 refills | Status: DC
  Filled 2022-04-09: qty 22, 11d supply, fill #0

## 2022-04-09 NOTE — Patient Instructions (Signed)

## 2022-04-09 NOTE — Progress Notes (Signed)
Juan [name] - 56 y.o. male MRN [id number]  Date of birth: Jun 04, 1966  Subjective Chief Complaint  Patient presents with  . Annual Exam    Pt reports ankle swelling     HPI Juan [name] is a 56 y.o. male here today for annual exam.    He was hospitalized in July for GI bleed 2/2 to thrombocytopenia.  Doing well today.  His wife has noticed some swelling in his ankles bilaterally.    Denies chest pain or dyspnea. Echo in 12/2021 was normal.   His activity is limited due to myalgias and gait issues.  Diet is pretty good.    He is a non-smoker.  He has abstained from EtOH use.   He does not want flu vaccine.   He is up to date on colon cancer screening  Review of Systems  Constitutional:  Negative for chills, fever, malaise/fatigue and weight loss.  HENT:  Negative for congestion, ear pain and sore throat.   Eyes:  Negative for blurred vision, double vision and pain.  Respiratory:  Negative for cough and shortness of breath.   Cardiovascular:  Negative for chest pain and palpitations.  Gastrointestinal:  Negative for abdominal pain, blood in stool, constipation, heartburn and nausea.  Genitourinary:  Negative for dysuria and urgency.  Musculoskeletal:  Negative for joint pain and myalgias.  Neurological:  Negative for dizziness and headaches.  Endo/Heme/Allergies:  Does not bruise/bleed easily.  Psychiatric/Behavioral:  Negative for depression. The patient is not nervous/anxious and does not have insomnia.     Allergies  Allergen Reactions  . Cucumber Extract Itching and Nausea And Vomiting    No extracts; just cucumber   . Peanut Butter Flavor Anaphylaxis and Swelling  . Peanut Oil Swelling  . Shellfish Allergy Itching and Swelling  . Cantaloupe Extract Allergy Skin Test Rash  . Lactose Other (See Comments)    Lactose intolerant - causes indigestion Stomach pain/gas  . Strawberry Extract Nausea And Vomiting and Swelling  . Vancomycin Rash  . Apple Juice Swelling     Swelling  of tongue  . Depakote Er [Divalproex Sodium Er] Swelling    Tongue swelling  . Codeine Itching and Rash    Past Medical History:  Diagnosis Date  . Alcohol addiction ([place])   . Anxiety   . 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 Chronic fatigue   . Cirrhosis ([place])   . Colon polyps   . Depression   . GERD (gastroesophageal reflux disease)   . Hemochromatosis   . Hx of blood clots    Leg  . Hyperreflexia   . Hypertension   . PTSD (post-traumatic stress disorder)   . Traumatic hemorrhagic shock [facility])     Past Surgical History:  Procedure Laterality Date  . BIOPSY  09/24/2021   Procedure: BIOPSY;  Surgeon: Sharyn Creamer, MD;  Location[facility] ENDOSCOPY;  Service: Gastroenterology;;  . Wilmon Pali RELEASE Bilateral   . COLONOSCOPY WITH PROPOFOL N/A 09/24/2021   Procedure: COLONOSCOPY WITH PROPOFOL;  Surgeon: Sharyn Creamer, MD;  Location: [place];  Service: Gastroenterology;  Laterality: N/A;  . ESOPHAGOGASTRODUODENOSCOPY (EGD) WITH PROPOFOL N/A 09/24/2021   Procedure: ESOPHAGOGASTRODUODENOSCOPY (EGD) WITH PROPOFOL;  Surgeon: Sharyn Creamer, MD;  Location: [place];  Service: Gastroenterology;  Laterality: N/A;  . FRACTURE SURGERY     left ankle plate  . HERNIA REPAIR     inguinal  . KNEE SURGERY Right    x 4  . SHOULDER SURGERY Bilateral  x 2  . VASECTOMY      Social History   Socioeconomic History  . Marital status: Married    Spouse name: [name]  . Number of children: 2  . Years of education: Not on file  . Highest education level: High school graduate  Occupational History    Comment: disability  Tobacco Use  . Smoking status: Never  . Smokeless tobacco: Current    Types: Snuff  Vaping Use  . Vaping Use: Never used  Substance and Sexual Activity  . Alcohol use: Not Currently  . Drug use: Never  . Sexual activity: Not on file  Other Topics Concern  . Not on file  Social History Narrative   Lives with wife   Social  Determinants of Health   Financial Resource Strain: Not on file  Food Insecurity: Not on file  Transportation Needs: Not on file  Physical Activity: Not on file  Stress: Not on file  Social Connections: Not on file    Family History  Problem Relation Age of Onset  . Pulmonary fibrosis Mother   . Hypertension Father   . Other Father        liver failure  . Diabetes Brother   . Breast cancer Paternal Aunt   . Colon cancer Neg Hx   . Esophageal cancer Neg Hx   . Stomach cancer Neg Hx   . Rectal cancer Neg Hx     Health Maintenance  Topic Date Due  . INFLUENZA VACCINE  03/23/2022  . COVID-19 Vaccine (4 - Pfizer series) 04/25/2022 (Originally 12/29/2020)  . TETANUS/TDAP  06/20/2029  . COLONOSCOPY (Pts 45-55yr Insurance coverage will need to be confirmed)  09/25/2031  . Hepatitis C Screening  Completed  . HIV Screening  Completed  . Zoster Vaccines- Shingrix  Completed  . HPV VACCINES  Aged Out     ----------------------------------------------------------------------------------------------------------------------------------------------------------------------------------------------------------------- Physical Exam BP 121/69   Pulse 84   Ht 5' 4"  (1.626 m)   Wt 168 lb 8 oz (76.4 kg)   SpO2 95%   BMI 28.92 kg/m   Physical Exam Constitutional:      General: He is not in acute distress. HENT:     Head: Normocephalic and atraumatic.     Right Ear: Tympanic membrane and external ear normal.     Left Ear: Tympanic membrane and external ear normal.  Eyes:     General: No scleral icterus. Neck:     Thyroid: No thyromegaly.  Cardiovascular:     Rate and Rhythm: Normal rate and regular rhythm.     Heart sounds: Normal heart sounds.  Pulmonary:     Effort: Pulmonary effort is normal.     Breath sounds: Normal breath sounds.  Abdominal:     General: Bowel sounds are normal. There is no distension.     Palpations: Abdomen is soft.     Tenderness: There is no  abdominal tenderness. There is no guarding.  Musculoskeletal:     Cervical back: Normal range of motion.  Lymphadenopathy:     Cervical: No cervical adenopathy.  Skin:    General: Skin is warm and dry.     Findings: No rash.  Neurological:     Mental Status: He is alert and oriented to person, place, and time.     Cranial Nerves: No cranial nerve deficit.     Motor: No abnormal muscle tone.  Psychiatric:        Mood and Affect: Mood normal.  Behavior: Behavior normal.    ------------------------------------------------------------------------------------------------------------------------------------------------------------------------------------------------------------------- Assessment and Plan  No problem-specific Assessment & Plan notes found for this encounter.   No orders of the defined types were placed in this encounter.   No follow-ups on file.    This visit occurred during the SARS-CoV-2 public health emergency.  Safety protocols were in place, including screening questions prior to the visit, additional usage of staff PPE, and extensive cleaning of exam room while observing appropriate contact time as indicated for disinfecting solutions.

## 2022-04-10 LAB — CBC WITH DIFFERENTIAL/PLATELET
Absolute Monocytes: 806 cells/uL (ref 200–950)
Basophils Absolute: 82 cells/uL (ref 0–200)
Basophils Relative: 1.7 %
Eosinophils Absolute: 168 cells/uL (ref 15–500)
Eosinophils Relative: 3.5 %
HCT: 30.3 % — ABNORMAL LOW (ref 38.5–50.0)
Hemoglobin: 9.9 g/dL — ABNORMAL LOW (ref 13.2–17.1)
Lymphs Abs: 1282 cells/uL (ref 850–3900)
MCH: 29.1 pg (ref 27.0–33.0)
MCHC: 32.7 g/dL (ref 32.0–36.0)
MCV: 89.1 fL (ref 80.0–100.0)
MPV: 9.8 fL (ref 7.5–12.5)
Monocytes Relative: 16.8 %
Neutro Abs: 2462 cells/uL (ref 1500–7800)
Neutrophils Relative %: 51.3 %
Platelets: 64 10*3/uL — ABNORMAL LOW (ref 140–400)
RBC: 3.4 10*6/uL — ABNORMAL LOW (ref 4.20–5.80)
RDW: 16.4 % — ABNORMAL HIGH (ref 11.0–15.0)
Total Lymphocyte: 26.7 %
WBC: 4.8 10*3/uL (ref 3.8–10.8)

## 2022-04-10 LAB — AMMONIA: Ammonia: 51 umol/L (ref <=72)

## 2022-04-10 LAB — COMPLETE METABOLIC PANEL WITH GFR
AG Ratio: 0.9 (calc) — ABNORMAL LOW (ref 1.0–2.5)
ALT: 22 U/L (ref 9–46)
AST: 87 U/L — ABNORMAL HIGH (ref 10–35)
Albumin: 3.4 g/dL — ABNORMAL LOW (ref 3.6–5.1)
Alkaline phosphatase (APISO): 85 U/L (ref 35–144)
BUN/Creatinine Ratio: 13 (calc) (ref 6–22)
BUN: 9 mg/dL (ref 7–25)
CO2: 26 mmol/L (ref 20–32)
Calcium: 8.9 mg/dL (ref 8.6–10.3)
Chloride: 101 mmol/L (ref 98–110)
Creat: 0.69 mg/dL — ABNORMAL LOW (ref 0.70–1.30)
Globulin: 3.7 g/dL (calc) (ref 1.9–3.7)
Glucose, Bld: 113 mg/dL — ABNORMAL HIGH (ref 65–99)
Potassium: 3 mmol/L — ABNORMAL LOW (ref 3.5–5.3)
Sodium: 139 mmol/L (ref 135–146)
Total Bilirubin: 2.1 mg/dL — ABNORMAL HIGH (ref 0.2–1.2)
Total Protein: 7.1 g/dL (ref 6.1–8.1)
eGFR: 110 mL/min/{1.73_m2} (ref >=60)

## 2022-04-10 LAB — LIPID PANEL W/REFLEX DIRECT LDL
Cholesterol: 178 mg/dL (ref <200)
HDL: 34 mg/dL — ABNORMAL LOW (ref >=40)
LDL Cholesterol (Calc): 123 mg/dL (calc) — ABNORMAL HIGH
Non-HDL Cholesterol (Calc): 144 mg/dL (calc) — ABNORMAL HIGH (ref <130)
Total CHOL/HDL Ratio: 5.2 (calc) — ABNORMAL HIGH (ref <5.0)
Triglycerides: 106 mg/dL (ref <150)

## 2022-04-10 LAB — TSH: TSH: 1.2 mIU/L (ref 0.40–4.50)

## 2022-04-10 LAB — MAGNESIUM: Magnesium: 1.1 mg/dL — ABNORMAL LOW (ref 1.5–2.5)

## 2022-04-10 LAB — PSA: PSA: 0.51 ng/mL (ref <=4.00)

## 2022-04-11 ENCOUNTER — Encounter: Payer: Self-pay | Admitting: Family Medicine

## 2022-04-11 NOTE — Assessment & Plan Note (Signed)
Well adult Orders Placed This Encounter  Procedures  . COMPLETE METABOLIC PANEL WITH GFR  . CBC with Differential  . Lipid Panel w/reflex Direct LDL  . TSH  . Ammonia  . Magnesium  . PSA  Screenings: Per lab orders Immunizations: Flu vaccine recommended, declines Anticipatory guidance/risk reduction: Recommendations per AVS.

## 2022-04-13 ENCOUNTER — Other Ambulatory Visit (HOSPITAL_BASED_OUTPATIENT_CLINIC_OR_DEPARTMENT_OTHER): Payer: Self-pay

## 2022-04-13 ENCOUNTER — Other Ambulatory Visit: Payer: Self-pay | Admitting: Family Medicine

## 2022-04-13 DIAGNOSIS — E876 Hypokalemia: Secondary | ICD-10-CM

## 2022-04-13 MED ORDER — SPIRONOLACTONE 25 MG PO TABS
25.0000 mg | ORAL_TABLET | Freq: Every day | ORAL | 3 refills | Status: DC
Start: 2022-04-13 — End: 2022-09-07
  Filled 2022-04-13: qty 90, 90d supply, fill #0
  Filled 2022-07-20: qty 90, 90d supply, fill #1

## 2022-04-16 ENCOUNTER — Other Ambulatory Visit: Payer: Self-pay | Admitting: Family Medicine

## 2022-04-19 ENCOUNTER — Ambulatory Visit (INDEPENDENT_AMBULATORY_CARE_PROVIDER_SITE_OTHER): Payer: 59 | Admitting: Internal Medicine

## 2022-04-19 ENCOUNTER — Other Ambulatory Visit (HOSPITAL_BASED_OUTPATIENT_CLINIC_OR_DEPARTMENT_OTHER): Payer: Self-pay

## 2022-04-19 ENCOUNTER — Encounter: Payer: Self-pay | Admitting: Internal Medicine

## 2022-04-19 VITALS — BP 110/76 | HR 86 | Ht 63.0 in | Wt 167.0 lb

## 2022-04-19 DIAGNOSIS — K7030 Alcoholic cirrhosis of liver without ascites: Secondary | ICD-10-CM

## 2022-04-19 NOTE — Patient Instructions (Signed)
_______________________________________________________  If you are age 54 or older, your body mass index should be between 23-30. Your Body mass index is 29.58 kg/m. If this is out of the aforementioned range listed, please consider follow up with your Primary Care Provider.  If you are age 27 or younger, your body mass index should be between 19-25. Your Body mass index is 29.58 kg/m. If this is out of the aformentioned range listed, please consider follow up with your Primary Care Provider.   ________________________________________________________  The [facility] providers would like to encourage you to use [facility] to communicate with providers for non-urgent requests or questions.  Due to long hold times on the telephone, sending your provider a message by [facility] may be a faster and more efficient way to get a response.  Please allow 48 business hours for a response.  Please remember that this is for non-urgent requests.  _______________________________________________________  Juan [name] will be due for a right upper quadrant ultrasound in Jan 2024.  We will send your records to [facility] liver transplant clinic. They will reach out to schedule .  We will send your records to the nutritionist with [facility].   It was a pleasure to see you today!  Thank you for trusting me with your gastrointestinal care!

## 2022-04-19 NOTE — Progress Notes (Signed)
 Chief Complaint: EtOH cirrhosis  HPI: 56 year old male with history of EtOH cirrhosis and possible myopathy presents for follow up of EtOH cirrhosis  Interval History: He was recently admitted 7/10-7/13 for rectal bleeding that was attributed to hemorrhoids. Since that hospitalization, his bleeding has resolved. Denies hematochezia. Denies confusion. He is taking lactulose once every 3 days. He is having 3-4 BMs per day. Denies abdominal swelling. Has chronic left ankle swelling. Last drink of alcohol 376 days ago. He is currently in AA. He is currently on PPI QD and does think that he is benefiting from the PPI therapy for GERD. He has not always been compliant with his low sodium diet.  Wt Readings from Last 3 Encounters:  04/19/22 167 lb (75.8 kg)  04/09/22 168 lb 8 oz (76.4 kg)  03/02/22 169 lb 15.6 oz (77.1 kg)   Current Outpatient Medications  Medication Sig Dispense Refill   folic acid (FOLVITE) 1 MG tablet TAKE 1 TABLET (1 MG TOTAL) BY MOUTH DAILY. (Patient taking differently: Take 1 mg by mouth daily.) 90 tablet 3   hydrocortisone (ANUSOL-HC) 25 MG suppository Unwrap and place 1 suppository rectally at bedtime. 12 suppository 0   Magnesium Chloride 64 MG TABS Take 2 tablets by mouth in the morning and at bedtime. (Patient taking differently: Take 64 mg by mouth daily in the afternoon.) 120 tablet 1   Multiple Vitamin (MULTIVITAMIN) capsule Take 2 capsules by mouth daily.     ondansetron (ZOFRAN-ODT) 8 MG disintegrating tablet Dissolve 1 tablet (8 mg total) by mouth every 8 (eight) hours as needed for nausea. 20 tablet 1   pantoprazole (PROTONIX) 40 MG tablet Take 1 tablet (40 mg total) by mouth daily. 60 tablet 1   potassium chloride SA (KLOR-CON M) 20 MEQ tablet Take 1 tablet (20 mEq total) by mouth daily.     saccharomyces boulardii (FLORASTOR) 250 MG capsule Take 250 mg by mouth 2 (two) times daily.      spironolactone (ALDACTONE) 25 MG tablet Take 1 tablet (25 mg total) by mouth  daily. 90 tablet 3   vortioxetine HBr (TRINTELLIX) 10 MG TABS tablet Take 1 tablet (10 mg total) by mouth daily. 30 tablet 3   acetaminophen (TYLENOL) 650 MG CR tablet Take 650 mg by mouth daily as needed for pain.     B Complex Vitamins (B COMPLEX PO) Take 1 tablet by mouth every morning. (Patient not taking: Reported on 04/09/2022)     busPIRone (BUSPAR) 15 MG tablet Take 15 mg by mouth 2 (two) times daily.     Cholecalciferol 50 MCG (2000 UT) CAPS Take 4,000 Units by mouth daily. (Patient not taking: Reported on 04/09/2022)     loperamide (IMODIUM) 2 MG capsule Take 1 capsule (2 mg total) by mouth as needed for diarrhea or loose stools. (Patient not taking: Reported on 04/19/2022) 30 capsule 0   mupirocin ointment (BACTROBAN) 2 % Apply 1 Application topically 2 (two) times daily. (Patient not taking: Reported on 04/19/2022) 22 g 0   No current facility-administered medications for this visit.   Physical Exam: BP 110/76   Pulse 86   Ht 5' 3" (1.6 m)   Wt 167 lb (75.8 kg)   SpO2 93%   BMI 29.58 kg/m  Constitutional: Pleasant,well-developed, male in no acute distress. HEENT: Normocephalic and atraumatic. Conjunctivae are normal. Mild scleral icterus Cardiovascular: Systolic murmur Pulmonary/chest: Effort normal and breath sounds normal. No wheezing, rales or rhonchi. Abdominal: Soft, nondistended, nontender. Bowel sounds active throughout.   There are no masses palpable. No hepatomegaly. Extremities: 2+ BLE edema Neurological: Alert and oriented.  No asterixis Skin: Skin is warm and dry. No rashes noted. Psychiatric: Normal mood and affect. Behavior is normal.  Labs 03/2021: Normal iron, normal ferritin.  Labs 08/2021: Positive PeTH.  Had some issues with hematochezia at that time.  Last CBC with hemoglobin of 10.9 and platelets of 68.  Last BMP unremarkable.  Last set of LFTs shows low albumin of 2.9, elevated AST of 94, ALT normal, normal alk phos, and elevated total bilirubin of  3.1.  Labs 03/2022: CBC with low plts of 64 and low Hb of 9.9. CMP with low potassium of 3 and elevated total bilirubin of 2.1 and elevated AST of 87. TSH nml.   MELD 3.0: 18 at 03/04/2022  5:43 AM MELD-Na: 17 at 03/04/2022  5:43 AM Calculated from: Serum Creatinine: 0.64 mg/dL (Using min of 1 mg/dL) at 03/04/2022  5:40 AM Serum Sodium: 137 mmol/L at 03/04/2022  5:40 AM Total Bilirubin: 3.5 mg/dL at 03/03/2022  3:19 AM Serum Albumin: 2.9 g/dL at 03/03/2022  3:19 AM INR(ratio): 1.7 at 03/04/2022  5:43 AM Age at listing (hypothetical): 56 years Sex: Male at 03/04/2022  5:43 AM  RUQ U/S 09/18/21: IMPRESSION: 1. Echogenic liver with coarsened echotexture and nodular surface contour suggestive of cirrhosis. Evaluation of the liver parenchyma is limited by poor penetration. No obvious liver lesions identified. 2. Cholelithiasis and mild diffuse gallbladder wall thickening. Wall thickening could be secondary to underlying liver disease versus inflammation. No sonographic Percell Miller sign was evident on exam. If there is high clinical suspicion for cholecystitis, a nuclear medicine hepatobiliary scan may be helpful to further assess.  CTA A/P w/contrast 03/01/22: NON-VASCULAR Changes of cirrhosis with portal venous hypertension. Recanalized umbilical vein and spontaneous left splenorenal shunt. Mild perihepatic and right abdominal ascites. Cholelithiasis.  EGD 09/24/21: - LA Grade C esophagitis with no bleeding. Biopsied. - Medium-sized hiatal hernia. - Portal hypertensive gastropathy. - Erythematous mucosa in the antrum. Biopsied. - Normal examined duodenum. Path: . STOMACH, BIOPSY:  -  Reactive gastropathy with mild foveolar hyperplasia.  -  Suggestive of mild vascular ectasia, without thrombi.  -  Negative for an inflammatory infiltrate predictive of Helicobacter  pylori infection.  -  Negative for intestinal metaplasia.  -  Negative for malignancy.   B. ESOPHAGUS, BIOPSY:  -   Squamocolumnar mucosa with no specific pathologic diagnosis (scant  columnar epithelium present).  -  Negative for acute esophagitis and intrasquamous eosinophils.  -  Negative for intestinal metaplasia.  -  No viral cytopathic change or fungi identified (on HE).  -  Negative for dysplasia and malignancy  Colonoscopy 09/24/21: - The examined portion of the ileum was normal. - A tattoo was seen in the sigmoid colon. - Non-bleeding internal hemorrhoids.  EGD 11/20/21: - Normal esophagus. - Medium-sized hiatal hernia. - Portal hypertensive gastropathy. - Normal examined duodenum. - No specimens collected.  ASSESSMENT AND PLAN: EtOH cirrhosis History of esophagitis Patient presents for follow up of alcoholic cirrhosis. Currently symptoms are stable. Had an episode of rectal bleeding that was attributed to hemorrhoidal bleeding. He has been intermittently been following a low sodium diet. Will refer to nutrition to aid in low sodium diet. - Will place another nutrition referral, aim for 1-1.5 g/kg/day protein intake and less than 2 g sodium/day - Continue lactulose therapy for hepatic encephalopathy - Encouraged him to get his hep A and B vaccinations with his PCP - Cont PPI QD -  Will need repeat abdominal imaging for HCC screening in 08/2022 - Referral to [facility] - RTC in 5 months  Claire , MD  I spent 44 minutes of time, including in depth chart review, independent review of results as outlined above, communicating results with the patient directly, face-to-face time with the patient, coordinating care, ordering studies and medications as appropriate, and documentation. 

## 2022-04-20 MED ORDER — BUSPIRONE HCL 15 MG PO TABS
15.0000 mg | ORAL_TABLET | Freq: Two times a day (BID) | ORAL | 1 refills | Status: AC
  Filled 2022-04-20: qty 180, 90d supply, fill #0

## 2022-04-20 NOTE — Telephone Encounter (Signed)
Last OV was 04/09/22.  Charyl Bigger, CMA

## 2022-04-21 ENCOUNTER — Other Ambulatory Visit (HOSPITAL_BASED_OUTPATIENT_CLINIC_OR_DEPARTMENT_OTHER): Payer: Self-pay

## 2022-05-04 ENCOUNTER — Inpatient Hospital Stay (HOSPITAL_COMMUNITY)
Admission: EM | Admit: 2022-05-04 | Discharge: 2022-05-07 | DRG: 312 | Disposition: A | Discharge status: Home / Self Care | Payer: No Typology Code available for payment source | Attending: Family Medicine | Admitting: Family Medicine

## 2022-05-04 ENCOUNTER — Encounter (HOSPITAL_COMMUNITY): Payer: Self-pay | Admitting: Emergency Medicine

## 2022-05-04 ENCOUNTER — Other Ambulatory Visit: Payer: Self-pay

## 2022-05-04 DIAGNOSIS — Z881 Allergy status to other antibiotic agents status: Secondary | ICD-10-CM

## 2022-05-04 DIAGNOSIS — I10 Essential (primary) hypertension: Secondary | ICD-10-CM | POA: Diagnosis present

## 2022-05-04 DIAGNOSIS — Z888 Allergy status to other drugs, medicaments and biological substances status: Secondary | ICD-10-CM

## 2022-05-04 DIAGNOSIS — R55 Syncope and collapse: Principal | ICD-10-CM | POA: Diagnosis present

## 2022-05-04 DIAGNOSIS — E876 Hypokalemia: Secondary | ICD-10-CM | POA: Diagnosis present

## 2022-05-04 DIAGNOSIS — Z8249 Family history of ischemic heart disease and other diseases of the circulatory system: Secondary | ICD-10-CM

## 2022-05-04 DIAGNOSIS — E8729 Other acidosis: Secondary | ICD-10-CM | POA: Diagnosis present

## 2022-05-04 DIAGNOSIS — F17210 Nicotine dependence, cigarettes, uncomplicated: Secondary | ICD-10-CM | POA: Diagnosis present

## 2022-05-04 DIAGNOSIS — E8809 Other disorders of plasma-protein metabolism, not elsewhere classified: Secondary | ICD-10-CM | POA: Diagnosis present

## 2022-05-04 DIAGNOSIS — K766 Portal hypertension: Secondary | ICD-10-CM | POA: Diagnosis present

## 2022-05-04 DIAGNOSIS — E872 Acidosis, unspecified: Secondary | ICD-10-CM | POA: Diagnosis present

## 2022-05-04 DIAGNOSIS — D696 Thrombocytopenia, unspecified: Secondary | ICD-10-CM | POA: Diagnosis present

## 2022-05-04 DIAGNOSIS — Z91013 Allergy to seafood: Secondary | ICD-10-CM

## 2022-05-04 DIAGNOSIS — K219 Gastro-esophageal reflux disease without esophagitis: Secondary | ICD-10-CM | POA: Diagnosis present

## 2022-05-04 DIAGNOSIS — D638 Anemia in other chronic diseases classified elsewhere: Secondary | ICD-10-CM | POA: Diagnosis present

## 2022-05-04 DIAGNOSIS — Z91018 Allergy to other foods: Secondary | ICD-10-CM

## 2022-05-04 DIAGNOSIS — R112 Nausea with vomiting, unspecified: Secondary | ICD-10-CM | POA: Diagnosis present

## 2022-05-04 DIAGNOSIS — R9431 Abnormal electrocardiogram [ECG] [EKG]: Secondary | ICD-10-CM | POA: Diagnosis present

## 2022-05-04 DIAGNOSIS — F4310 Post-traumatic stress disorder, unspecified: Secondary | ICD-10-CM | POA: Diagnosis present

## 2022-05-04 DIAGNOSIS — F411 Generalized anxiety disorder: Secondary | ICD-10-CM | POA: Diagnosis present

## 2022-05-04 DIAGNOSIS — K709 Alcoholic liver disease, unspecified: Secondary | ICD-10-CM | POA: Diagnosis present

## 2022-05-04 DIAGNOSIS — D689 Coagulation defect, unspecified: Secondary | ICD-10-CM | POA: Diagnosis present

## 2022-05-04 DIAGNOSIS — Z833 Family history of diabetes mellitus: Secondary | ICD-10-CM

## 2022-05-04 DIAGNOSIS — Z86010 Personal history of colonic polyps: Secondary | ICD-10-CM

## 2022-05-04 DIAGNOSIS — F32A Depression, unspecified: Secondary | ICD-10-CM | POA: Diagnosis present

## 2022-05-04 DIAGNOSIS — K7030 Alcoholic cirrhosis of liver without ascites: Secondary | ICD-10-CM | POA: Diagnosis present

## 2022-05-04 DIAGNOSIS — A084 Viral intestinal infection, unspecified: Secondary | ICD-10-CM | POA: Diagnosis present

## 2022-05-04 DIAGNOSIS — Z803 Family history of malignant neoplasm of breast: Secondary | ICD-10-CM

## 2022-05-04 DIAGNOSIS — R5382 Chronic fatigue, unspecified: Secondary | ICD-10-CM | POA: Diagnosis present

## 2022-05-04 DIAGNOSIS — E739 Lactose intolerance, unspecified: Secondary | ICD-10-CM | POA: Diagnosis present

## 2022-05-04 DIAGNOSIS — Z86718 Personal history of other venous thrombosis and embolism: Secondary | ICD-10-CM

## 2022-05-04 LAB — CBC WITH DIFFERENTIAL/PLATELET
Abs Immature Granulocytes: 0.03 10*3/uL (ref 0.00–0.07)
Basophils Absolute: 0 10*3/uL (ref 0.0–0.1)
Basophils Relative: 1 %
Eosinophils Absolute: 0.1 10*3/uL (ref 0.0–0.5)
Eosinophils Relative: 2 %
HCT: 28.4 % — ABNORMAL LOW (ref 39.0–52.0)
Hemoglobin: 9.5 g/dL — ABNORMAL LOW (ref 13.0–17.0)
Immature Granulocytes: 0 %
Lymphocytes Relative: 14 %
Lymphs Abs: 1.1 10*3/uL (ref 0.7–4.0)
MCH: 28.8 pg (ref 26.0–34.0)
MCHC: 33.5 g/dL (ref 30.0–36.0)
MCV: 86.1 fL (ref 80.0–100.0)
Monocytes Absolute: 0.7 10*3/uL (ref 0.1–1.0)
Monocytes Relative: 10 %
Neutro Abs: 5.3 10*3/uL (ref 1.7–7.7)
Neutrophils Relative %: 73 %
Platelets: 46 10*3/uL — ABNORMAL LOW (ref 150–400)
RBC: 3.3 MIL/uL — ABNORMAL LOW (ref 4.22–5.81)
RDW: 18 % — ABNORMAL HIGH (ref 11.5–15.5)
WBC: 7.3 10*3/uL (ref 4.0–10.5)
nRBC: 0 % (ref 0.0–0.2)

## 2022-05-04 LAB — I-STAT CHEM 8, ED
BUN: 5 mg/dL — ABNORMAL LOW (ref 6–20)
Calcium, Ion: 0.89 mmol/L — CL (ref 1.15–1.40)
Chloride: 99 mmol/L (ref 98–111)
Creatinine, Ser: 0.9 mg/dL (ref 0.61–1.24)
Glucose, Bld: 136 mg/dL — ABNORMAL HIGH (ref 70–99)
HCT: 31 % — ABNORMAL LOW (ref 39.0–52.0)
Hemoglobin: 10.5 g/dL — ABNORMAL LOW (ref 13.0–17.0)
Potassium: 2.6 mmol/L — CL (ref 3.5–5.1)
Sodium: 136 mmol/L (ref 135–145)
TCO2: 18 mmol/L — ABNORMAL LOW (ref 22–32)

## 2022-05-04 MED ORDER — LACTATED RINGERS IV BOLUS
1000.0000 mL | Freq: Once | INTRAVENOUS | Status: AC
  Administered 2022-05-05: 1000 mL via INTRAVENOUS

## 2022-05-04 NOTE — ED Triage Notes (Signed)
Pt bib ems for syncopal episode lasting approx 30 seconds. Denies trauma - pt was lowered to floor by family. Pt c/o feeling weak with n/v/d since Sunday. Hypotensive on ems arrival at 74/48. 700 ml NS given PTA. BP improved to 108/60. All other vss per ems.

## 2022-05-04 NOTE — ED Provider Notes (Signed)
[place] EMERGENCY DEPARTMENT Provider Note   CSN: [id number] Arrival date & time: 05/04/22  2307     History {Add pertinent medical, surgical, social history, OB history to HPI:1} Chief Complaint  Patient presents with   Loss of Consciousness    Juan [name] is a 56 y.o. male.  The history is provided by the patient, the EMS personnel and medical records.  Loss of Consciousness Juan [name] is a 56 y.o. male who presents to the Emergency Department complaining of syncope.  He presents to the emergency department by EMS for evaluation following a syncopal episode.  He has been feeling unwell since Sunday with numerous episodes of vomiting and diarrhea.  He reports 6 or more episodes of emesis that looks like what ever he is ingested such as Gatorade.  He also has 3 more episodes of yellow watery stools daily.  No associated abdominal pain.  He has fatigue and generalized weakness.  He was laying in bed watching TV when he started to feel unwell and tried to get up to use the bathroom and he was assisted to the floor was unconscious for about 30 seconds.  No fevers, chest pain, Donnell pain.  He does state that he has not been able to take his probiotic, magnesium and potassium supplements over the last few days due to being in the middle of a move.  He has had similar episodes in the past.  EMS reports blood pressure in the 70s and he received 700 cc normal saline prior to ED arrival.     Home Medications Prior to Admission medications   Medication Sig Start Date End Date Taking? Authorizing Provider  busPIRone (BUSPAR) 15 MG tablet Take 1 tablet (15 mg total) by mouth 2 (two) times daily. 04/20/22 07/19/22  [name], DO  acetaminophen (TYLENOL) 650 MG CR tablet Take 650 mg by mouth daily as needed for pain.    [provider]  B Complex Vitamins (B COMPLEX PO) Take 1 tablet by mouth every morning. Patient not taking: Reported on 04/09/2022     [provider]  Cholecalciferol 50 MCG (2000 UT) CAPS Take 4,000 Units by mouth daily. Patient not taking: Reported on 04/09/2022    [provider]  folic acid (FOLVITE) 1 MG tablet TAKE 1 TABLET (1 MG TOTAL) BY MOUTH DAILY. Patient taking differently: Take 1 mg by mouth daily. 06/22/21 07/22/22  Silverio Decamp, MD  hydrocortisone (ANUSOL-HC) 25 MG suppository Unwrap and place 1 suppository rectally at bedtime. 03/04/22   Geradine Girt, DO  loperamide (IMODIUM) 2 MG capsule Take 1 capsule (2 mg total) by mouth as needed for diarrhea or loose stools. Patient not taking: Reported on 04/19/2022 03/04/22   Geradine Girt, DO  Magnesium Chloride 64 MG TABS Take 2 tablets by mouth in the morning and at bedtime. Patient taking differently: Take 64 mg by mouth daily in the afternoon. 11/18/21   [name], DO  Multiple Vitamin (MULTIVITAMIN) capsule Take 2 capsules by mouth daily.    [provider]  mupirocin ointment (BACTROBAN) 2 % Apply 1 Application topically 2 (two) times daily. Patient not taking: Reported on 04/19/2022 04/09/22   [name], DO  ondansetron (ZOFRAN-ODT) 8 MG disintegrating tablet Dissolve 1 tablet (8 mg total) by mouth every 8 (eight) hours as needed for nausea. 04/09/22   [name], DO  pantoprazole (PROTONIX) 40 MG tablet Take 1 tablet (40 mg total) by mouth daily. 11/20/21 11/20/22  Dayna Barker  C, MD  potassium chloride SA (KLOR-CON M) 20 MEQ tablet Take 1 tablet (20 mEq total) by mouth daily. 03/04/22 04/19/22  Geradine Girt, DO  saccharomyces boulardii (FLORASTOR) 250 MG capsule Take 250 mg by mouth 2 (two) times daily.     [provider]  spironolactone (ALDACTONE) 25 MG tablet Take 1 tablet (25 mg total) by mouth daily. 04/13/22   [name], DO  vortioxetine HBr (TRINTELLIX) 10 MG TABS tablet Take 1 tablet (10 mg total) by mouth daily. 09/02/21   [name], DO      Allergies    Cucumber extract, Peanut butter  flavor, Peanut oil, Shellfish allergy, Cantaloupe extract allergy skin test, Lactose, Strawberry extract, Vancomycin, Apple, Apple juice, Depakote er [divalproex sodium er], and Codeine    Review of Systems   Review of Systems  Cardiovascular:  Positive for syncope.  All other systems reviewed and are negative.   Physical Exam Updated Vital Signs BP (!) 105/57   Pulse 85   Temp 98.4 F (36.9 C) (Oral)   Resp 13   SpO2 100%  Physical Exam Vitals and nursing note reviewed.  Constitutional:      Appearance: He is well-developed.  HENT:     Head: Normocephalic and atraumatic.     Comments: Left subconjunctival hemorrhage Cardiovascular:     Rate and Rhythm: Normal rate and regular rhythm.     Heart sounds: No murmur heard. Pulmonary:     Effort: Pulmonary effort is normal. No respiratory distress.     Breath sounds: Normal breath sounds.  Abdominal:     Palpations: Abdomen is soft.     Tenderness: There is no abdominal tenderness. There is no guarding or rebound.  Musculoskeletal:        General: No swelling or tenderness.  Skin:    General: Skin is warm and dry.  Neurological:     Mental Status: He is alert and oriented to person, place, and time.  Psychiatric:        Behavior: Behavior normal.     ED Results / Procedures / Treatments   Labs (all labs ordered are listed, but only abnormal results are displayed) Labs Reviewed  COMPREHENSIVE METABOLIC PANEL  CBC WITH DIFFERENTIAL/PLATELET  URINALYSIS, ROUTINE W REFLEX MICROSCOPIC  MAGNESIUM  I-STAT CHEM 8, ED    EKG None  Radiology No results found.  Procedures Procedures  {Document cardiac monitor, telemetry assessment procedure when appropriate:1}  Medications Ordered in ED Medications - No data to display  ED Course/ Medical Decision Making/ A&P                           Medical Decision Making Amount and/or Complexity of Data Reviewed Labs: ordered.   ***  {Document critical care time when  appropriate:1} {Document review of labs and clinical decision tools ie heart score, Chads2Vasc2 etc:1}  {Document your independent review of radiology images, and any outside records:1} {Document your discussion with family members, caretakers, and with consultants:1} {Document social determinants of health affecting pt's care:1} {Document your decision making why or why not admission, treatments were needed:1} Final Clinical Impression(s) / ED Diagnoses Final diagnoses:  None    Rx / DC Orders ED Discharge Orders     None

## 2022-05-05 ENCOUNTER — Encounter (HOSPITAL_COMMUNITY): Payer: Self-pay | Admitting: Internal Medicine

## 2022-05-05 ENCOUNTER — Observation Stay (HOSPITAL_COMMUNITY): Payer: No Typology Code available for payment source

## 2022-05-05 DIAGNOSIS — E8729 Other acidosis: Secondary | ICD-10-CM | POA: Diagnosis present

## 2022-05-05 DIAGNOSIS — R55 Syncope and collapse: Secondary | ICD-10-CM | POA: Diagnosis present

## 2022-05-05 DIAGNOSIS — K709 Alcoholic liver disease, unspecified: Secondary | ICD-10-CM | POA: Diagnosis not present

## 2022-05-05 DIAGNOSIS — D638 Anemia in other chronic diseases classified elsewhere: Secondary | ICD-10-CM

## 2022-05-05 DIAGNOSIS — K219 Gastro-esophageal reflux disease without esophagitis: Secondary | ICD-10-CM | POA: Diagnosis not present

## 2022-05-05 DIAGNOSIS — R9431 Abnormal electrocardiogram [ECG] [EKG]: Secondary | ICD-10-CM | POA: Diagnosis present

## 2022-05-05 DIAGNOSIS — E876 Hypokalemia: Secondary | ICD-10-CM

## 2022-05-05 DIAGNOSIS — R112 Nausea with vomiting, unspecified: Secondary | ICD-10-CM

## 2022-05-05 DIAGNOSIS — R197 Diarrhea, unspecified: Secondary | ICD-10-CM

## 2022-05-05 HISTORY — DX: Anemia in other chronic diseases classified elsewhere: D63.8

## 2022-05-05 HISTORY — DX: Abnormal electrocardiogram (ECG) (EKG): R94.31

## 2022-05-05 LAB — ECHOCARDIOGRAM COMPLETE
AR max vel: 2.09 cm2
AV Area VTI: 2.47 cm2
AV Area mean vel: 1.99 cm2
AV Mean grad: 20 mmHg
AV Peak grad: 28.7 mmHg
Ao pk vel: 2.68 m/s
Area-P 1/2: 3.61 cm2
Calc EF: 62.6 %
Height: 63 in
MV M vel: 2.5 m/s
MV Peak grad: 24.9 mmHg
S' Lateral: 3.1 cm
Single Plane A2C EF: 65.5 %
Single Plane A4C EF: 60.3 %
Weight: 2585.55 oz

## 2022-05-05 LAB — CBC WITH DIFFERENTIAL/PLATELET
Abs Immature Granulocytes: 0.03 10*3/uL (ref 0.00–0.07)
Basophils Absolute: 0 10*3/uL (ref 0.0–0.1)
Basophils Relative: 0 %
Eosinophils Absolute: 0.1 10*3/uL (ref 0.0–0.5)
Eosinophils Relative: 1 %
HCT: 25.2 % — ABNORMAL LOW (ref 39.0–52.0)
Hemoglobin: 8.7 g/dL — ABNORMAL LOW (ref 13.0–17.0)
Immature Granulocytes: 0 %
Lymphocytes Relative: 15 %
Lymphs Abs: 1 10*3/uL (ref 0.7–4.0)
MCH: 29.6 pg (ref 26.0–34.0)
MCHC: 34.5 g/dL (ref 30.0–36.0)
MCV: 85.7 fL (ref 80.0–100.0)
Monocytes Absolute: 0.9 10*3/uL (ref 0.1–1.0)
Monocytes Relative: 12 %
Neutro Abs: 5 10*3/uL (ref 1.7–7.7)
Neutrophils Relative %: 72 %
Platelets: 42 10*3/uL — ABNORMAL LOW (ref 150–400)
RBC: 2.94 MIL/uL — ABNORMAL LOW (ref 4.22–5.81)
RDW: 18.3 % — ABNORMAL HIGH (ref 11.5–15.5)
WBC: 7 10*3/uL (ref 4.0–10.5)
nRBC: 0 % (ref 0.0–0.2)

## 2022-05-05 LAB — BASIC METABOLIC PANEL
Anion gap: 6 (ref 5–15)
BUN: <5 mg/dL — ABNORMAL LOW (ref 6–20)
CO2: 21 mmol/L — ABNORMAL LOW (ref 22–32)
Calcium: 7.3 mg/dL — ABNORMAL LOW (ref 8.9–10.3)
Chloride: 106 mmol/L (ref 98–111)
Creatinine, Ser: 0.91 mg/dL (ref 0.61–1.24)
GFR, Estimated: >60 mL/min (ref >60)
Glucose, Bld: 114 mg/dL — ABNORMAL HIGH (ref 70–99)
Potassium: 3.4 mmol/L — ABNORMAL LOW (ref 3.5–5.1)
Sodium: 133 mmol/L — ABNORMAL LOW (ref 135–145)

## 2022-05-05 LAB — COMPREHENSIVE METABOLIC PANEL
ALT: 26 U/L (ref 0–44)
ALT: 24 U/L (ref 0–44)
AST: 79 U/L — ABNORMAL HIGH (ref 15–41)
AST: 69 U/L — ABNORMAL HIGH (ref 15–41)
Albumin: 2.6 g/dL — ABNORMAL LOW (ref 3.5–5.0)
Albumin: 2.4 g/dL — ABNORMAL LOW (ref 3.5–5.0)
Alkaline Phosphatase: 69 U/L (ref 38–126)
Alkaline Phosphatase: 60 U/L (ref 38–126)
Anion gap: 17 — ABNORMAL HIGH (ref 5–15)
Anion gap: 9 (ref 5–15)
BUN: 7 mg/dL (ref 6–20)
BUN: 6 mg/dL (ref 6–20)
CO2: 17 mmol/L — ABNORMAL LOW (ref 22–32)
CO2: 21 mmol/L — ABNORMAL LOW (ref 22–32)
Calcium: 7.7 mg/dL — ABNORMAL LOW (ref 8.9–10.3)
Calcium: 7.5 mg/dL — ABNORMAL LOW (ref 8.9–10.3)
Chloride: 101 mmol/L (ref 98–111)
Chloride: 105 mmol/L (ref 98–111)
Creatinine, Ser: 1.11 mg/dL (ref 0.61–1.24)
Creatinine, Ser: 0.93 mg/dL (ref 0.61–1.24)
GFR, Estimated: >60 mL/min (ref >60)
GFR, Estimated: >60 mL/min (ref >60)
Glucose, Bld: 143 mg/dL — ABNORMAL HIGH (ref 70–99)
Glucose, Bld: 129 mg/dL — ABNORMAL HIGH (ref 70–99)
Potassium: 2.5 mmol/L — CL (ref 3.5–5.1)
Potassium: 2.6 mmol/L — CL (ref 3.5–5.1)
Sodium: 135 mmol/L (ref 135–145)
Sodium: 135 mmol/L (ref 135–145)
Total Bilirubin: 4.1 mg/dL — ABNORMAL HIGH (ref 0.3–1.2)
Total Bilirubin: 3.6 mg/dL — ABNORMAL HIGH (ref 0.3–1.2)
Total Protein: 6.5 g/dL (ref 6.5–8.1)
Total Protein: 5.7 g/dL — ABNORMAL LOW (ref 6.5–8.1)

## 2022-05-05 LAB — URINALYSIS, ROUTINE W REFLEX MICROSCOPIC
Bilirubin Urine: NEGATIVE
Glucose, UA: NEGATIVE mg/dL
Hgb urine dipstick: NEGATIVE
Ketones, ur: NEGATIVE mg/dL
Leukocytes,Ua: NEGATIVE
Nitrite: NEGATIVE
Protein, ur: NEGATIVE mg/dL
Specific Gravity, Urine: 1.006 (ref 1.005–1.030)
pH: 7 (ref 5.0–8.0)

## 2022-05-05 LAB — RAPID URINE DRUG SCREEN, HOSP PERFORMED
Amphetamines: NOT DETECTED
Barbiturates: NOT DETECTED
Benzodiazepines: NOT DETECTED
Cocaine: NOT DETECTED
Opiates: NOT DETECTED
Tetrahydrocannabinol: POSITIVE — AB

## 2022-05-05 LAB — PROTIME-INR
INR: 2.2 — ABNORMAL HIGH (ref 0.8–1.2)
Prothrombin Time: 24.2 seconds — ABNORMAL HIGH (ref 11.4–15.2)

## 2022-05-05 LAB — MAGNESIUM
Magnesium: 0.6 mg/dL — CL (ref 1.7–2.4)
Magnesium: 1.3 mg/dL — ABNORMAL LOW (ref 1.7–2.4)
Magnesium: 1.7 mg/dL (ref 1.7–2.4)

## 2022-05-05 LAB — PHOSPHORUS: Phosphorus: 1.3 mg/dL — ABNORMAL LOW (ref 2.5–4.6)

## 2022-05-05 LAB — BILIRUBIN, DIRECT: Bilirubin, Direct: 1.2 mg/dL — ABNORMAL HIGH (ref 0.0–0.2)

## 2022-05-05 LAB — ETHANOL: Alcohol, Ethyl (B): <10 mg/dL (ref <10)

## 2022-05-05 MED ORDER — POTASSIUM CHLORIDE CRYS ER 20 MEQ PO TBCR: 40.0000 meq | EXTENDED_RELEASE_TABLET | Freq: Once | ORAL | Status: DC

## 2022-05-05 MED ORDER — MAGNESIUM SULFATE 2 GM/50ML IV SOLN
2.0000 g | Freq: Once | INTRAVENOUS | Status: AC
  Administered 2022-05-05: 2 g via INTRAVENOUS
  Filled 2022-05-05: qty 50

## 2022-05-05 MED ORDER — BUSPIRONE HCL 5 MG PO TABS
15.0000 mg | ORAL_TABLET | Freq: Two times a day (BID) | ORAL | Status: DC
  Administered 2022-05-05 – 2022-05-07 (×5): 15 mg via ORAL
  Filled 2022-05-05 (×5): qty 3

## 2022-05-05 MED ORDER — VORTIOXETINE HBR 5 MG PO TABS
10.0000 mg | ORAL_TABLET | Freq: Every day | ORAL | Status: DC
  Administered 2022-05-05 – 2022-05-07 (×3): 10 mg via ORAL
  Filled 2022-05-05 (×3): qty 2

## 2022-05-05 MED ORDER — POTASSIUM CHLORIDE 20 MEQ PO PACK
40.0000 meq | PACK | ORAL | Status: AC
  Administered 2022-05-05 (×2): 40 meq via ORAL
  Filled 2022-05-05 (×2): qty 2

## 2022-05-05 MED ORDER — ACETAMINOPHEN 650 MG RE SUPP: 650.0000 mg | Freq: Four times a day (QID) | RECTAL | Status: DC | PRN

## 2022-05-05 MED ORDER — POTASSIUM PHOSPHATES 15 MMOLE/5ML IV SOLN
30.0000 mmol | Freq: Once | INTRAVENOUS | Status: AC
  Administered 2022-05-05: 30 mmol via INTRAVENOUS
  Filled 2022-05-05: qty 10

## 2022-05-05 MED ORDER — POTASSIUM CHLORIDE CRYS ER 20 MEQ PO TBCR
40.0000 meq | EXTENDED_RELEASE_TABLET | Freq: Once | ORAL | Status: AC
  Administered 2022-05-05: 40 meq via ORAL
  Filled 2022-05-05: qty 2

## 2022-05-05 MED ORDER — ACETAMINOPHEN 325 MG PO TABS
650.0000 mg | ORAL_TABLET | Freq: Four times a day (QID) | ORAL | Status: DC | PRN
  Filled 2022-05-05: qty 2

## 2022-05-05 MED ORDER — LORAZEPAM 2 MG/ML IJ SOLN
0.5000 mg | INTRAMUSCULAR | Status: DC | PRN
  Administered 2022-05-05: 0.5 mg via INTRAVENOUS
  Filled 2022-05-05: qty 1

## 2022-05-05 MED ORDER — POTASSIUM CHLORIDE 20 MEQ PO PACK
40.0000 meq | PACK | Freq: Once | ORAL | Status: AC
  Administered 2022-05-05: 40 meq via ORAL
  Filled 2022-05-05: qty 2

## 2022-05-05 MED ORDER — POTASSIUM CHLORIDE 10 MEQ/100ML IV SOLN
10.0000 meq | INTRAVENOUS | Status: AC
  Administered 2022-05-05 (×3): 10 meq via INTRAVENOUS
  Filled 2022-05-05 (×3): qty 100

## 2022-05-05 MED ORDER — MAGNESIUM SULFATE 2 GM/50ML IV SOLN: 2.0000 g | Freq: Once | INTRAVENOUS | Status: DC

## 2022-05-05 NOTE — Progress Notes (Signed)
Pt oriented to unit and call light use. Bed alarm placed on pt for safety. Discussed with nurse [place], from ED prior to pt arrival about recheck of potassium due to infusions being started when last lab was drawn for CMP. Potassium was 2.5 prior to replacement orders and rechecked while infusion was ongoing with a result of 2.6. Nurse stated at bedside upon pt arrival to unit MD was made aware and will pass along for oncoming team to reorder labs on pt. Will report this to oncoming nurse to follow up on pt.

## 2022-05-05 NOTE — H&P (Addendum)
History and Physical    PLEASE NOTE THAT DRAGON DICTATION SOFTWARE WAS USED IN THE CONSTRUCTION OF THIS NOTE.   Juan [name] JOI:[id number] DOB: 1966-02-21 DOA: 05/04/2022  PCP: [name], DO  Patient coming from: home   I have personally briefly reviewed patient's old medical records in [place]  Chief Complaint: Episode loss consciousness.  HPI: Juan [name] is a 56 y.o. male with medical history significant for alcoholic cirrhosis complicated by portal hypertension and chronic anemia/chronic thrombocytopenia, GERD, generalized anxiety disorder, who is admitted to [facility] on 05/04/2022 for further evaluation management of single episode of syncope presenting from home to [facility] emergency department complaining of a single episode lost consciousness.  The following history is provided via my discussions with the patient as well as discussions with the patient's wife, who is present at bedside, in addition to medications to the EDP and via chart review.  The patient has been experiencing intermittent nausea/vomiting/loose stools over the preceding 2 to 3 days.  Over that timeframe, he notes 2-3 daily episodes of nonbilious, nonbilious emesis, with most recent episode of emesis occurring yesterday, although he does convey some mild residual nausea at this time.  Additionally, over that symptom for him, he notes to daily episodes of loose stool in the absence of any melena or hematochezia.  He conveys that this aspect has been improving and notes that most recent episode of loose stool also occurred yesterday.  Is not been associated with any abdominal discomfort subjective fever, chills, rigors, or generalized myalgias.  In the context of this intermittent nausea/vomiting, the patient conveys that he is not taking his spironolactone or daily oral potassium supplementation at home over the last 48 hours.  Earlier this evening, after the patient had been laying in  bed for a prolonged period of time, he awoke to use the bathroom, and upon standing up developed dizziness, lightheadedness, and the subjective sensation of impending loss of consciousness.  He was able to sit down in a chair near his bed, at which time his awoke and was standing next to him when the patient experienced an episode of syncope, in which he lost consciousness leaning forward, and with wife catching the patient before he could hit the floor.  Did not hit his head as a component of these events.  Wife conveys that patient's duration of loss of consciousness was approximately 20 seconds, was not associate with any tonic-clonic activity, and or any associated loss of bowel/bladder function or any tongue biting.  No evidence of confusion upon regaining consciousness.  Subsequently, EMS was called, reportedly noted initial systolic blood pressures to be in the 70s.  He subsequently administered a reported 500 cc of IV fluid and brought the patient to  [facility] emergency department for further evaluation management of episode of syncope.  Denies any recent chest discomfort, shortness of breath.  Not on any blood thinners.   He has a history of alcoholic cirrhosis, and reports that he last consumed alcohol a little more than 200 days ago.  Denies any recreational drug use.  Per chart review, most recent prior set of liver enzymes were drawn in August 2023 and notable for AST 87, ALT 22, total bilirubin 2.1.  Additionally, most recent prior INR was 1.7 in July 2023.  In the setting of his cirrhosis, he has chronic anemia with baseline hemoglobin of 9-11, as well as chronic thrombocytopenia.      ED Course:  Vital signs in the ED were  notable for the following: Afebrile; heart rate 85-96; blood pressure 91/71 - 105/65; respiratory 15-22, oxygen saturation 93 to 97% on room air.  Labs were notable for the following: CMP notable for the following: Sodium 135, potassium 2.5, bicarbonate 15, anion gap 15,  creatinine 1.11, glucose 143, calcium, adjusted for mild hypoalbuminemia noted to be 8.9, albumin 2.6, alkaline phosphatase 69, AST 79, ALT 26, total bilirubin 4.  Serum magnesium level 0.6.  INR 2.2.  CBC notable for white cell count 7300, hemoglobin 9.5, platelet count 46 relative to most recent prior value of 64 in August 2023.  Urinalysis demonstrated no evidence of white blood cells.  Imaging and additional notable ED work-up: EKG, compared to most recent prior from 01/10/2022, shows sinus rhythm with heart rate 85, prolonged QTc of 542, and nonspecific T wave inversion in leads III and aVF, which appears unchanged relative to most recent prior EKG, showing evidence of ST changes, including no evidence of ST elevation.  While in the ED, the following were administered: Magnesium sulfate 2 g IV over 2 hours x 1 dose, potassium chloride 30 mill colons IV over 3 hours x 1 dose, lactated Ringer's x1 L bolus.  Subsequently, the patient was admitted for overnight observation for single episode of syncope, as well as hypokalemia, hypomagnesemia, while noting presenting EKG to demonstrate QTc prolongation.     Review of Systems: As per HPI otherwise 10 point review of systems negative.   Past Medical History:  Diagnosis Date   Alcohol addiction ([place])    Anxiety    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HCC) 52/77/8242   Chronic fatigue    Cirrhosis (HCC)    Colon polyps    Depression    GERD (gastroesophageal reflux disease)    Hemochromatosis    Hiatal hernia 02/20/2022   Hx of blood clots    Leg   Hyperreflexia    Hypertension    PTSD (post-traumatic stress disorder)    Traumatic hemorrhagic shock [facility])     Past Surgical History:  Procedure Laterality Date   BIOPSY  09/24/2021   Procedure: BIOPSY;  Surgeon: Sharyn Creamer, MD;  Location: [facility] ENDOSCOPY;  Service: Gastroenterology;;   Wilmon Pali RELEASE Bilateral    COLONOSCOPY WITH PROPOFOL N/A 09/24/2021   Procedure: COLONOSCOPY WITH  PROPOFOL;  Surgeon: Sharyn Creamer, MD;  Location: [place];  Service: Gastroenterology;  Laterality: N/A;   ESOPHAGOGASTRODUODENOSCOPY (EGD) WITH PROPOFOL N/A 09/24/2021   Procedure: ESOPHAGOGASTRODUODENOSCOPY (EGD) WITH PROPOFOL;  Surgeon: Sharyn Creamer, MD;  Location: [place];  Service: Gastroenterology;  Laterality: N/A;   FRACTURE SURGERY     left ankle plate   HERNIA REPAIR     inguinal   KNEE SURGERY Right    x 4   SHOULDER SURGERY Bilateral    x 2   VASECTOMY      Social History:  reports that he has been smoking cigarettes. His smokeless tobacco use includes snuff. He reports that he does not currently use alcohol. He reports that he does not use drugs.   Allergies  Allergen Reactions   Cucumber Extract Itching and Nausea And Vomiting    No extracts; just cucumber    Peanut Butter Flavor Anaphylaxis and Swelling   Peanut Oil Swelling   Shellfish Allergy Itching and Swelling   Cantaloupe Extract Allergy Skin Test Rash   Lactose Other (See Comments)    Lactose intolerant - causes indigestion Stomach pain/gas   Strawberry Extract Nausea And Vomiting and Swelling   Vancomycin  Rash   Apple Swelling   Apple Juice Swelling    Swelling  of tongue   Depakote Er [Divalproex Sodium Er] Swelling    Tongue swelling   Codeine Itching and Rash    Family History  Problem Relation Age of Onset   Pulmonary fibrosis Mother    Hypertension Father    Other Father        liver failure   Diabetes Brother    Breast cancer Paternal Aunt    Colon cancer Neg Hx    Esophageal cancer Neg Hx    Stomach cancer Neg Hx    Rectal cancer Neg Hx     Family history reviewed and not pertinent    Prior to Admission medications   Medication Sig Start Date End Date Taking? Authorizing Provider  busPIRone (BUSPAR) 15 MG tablet Take 1 tablet (15 mg total) by mouth 2 (two) times daily. 04/20/22 07/19/22  [name], DO  acetaminophen (TYLENOL) 650 MG CR tablet Take 650 mg by mouth  daily as needed for pain.    [provider]  B Complex Vitamins (B COMPLEX PO) Take 1 tablet by mouth every morning. Patient not taking: Reported on 04/09/2022    [provider]  Cholecalciferol 50 MCG (2000 UT) CAPS Take 4,000 Units by mouth daily. Patient not taking: Reported on 04/09/2022    [provider]  folic acid (FOLVITE) 1 MG tablet TAKE 1 TABLET (1 MG TOTAL) BY MOUTH DAILY. Patient taking differently: Take 1 mg by mouth daily. 06/22/21 07/22/22  Silverio Decamp, MD  hydrocortisone (ANUSOL-HC) 25 MG suppository Unwrap and place 1 suppository rectally at bedtime. 03/04/22   Geradine Girt, DO  loperamide (IMODIUM) 2 MG capsule Take 1 capsule (2 mg total) by mouth as needed for diarrhea or loose stools. Patient not taking: Reported on 04/19/2022 03/04/22   Geradine Girt, DO  Magnesium Chloride 64 MG TABS Take 2 tablets by mouth in the morning and at bedtime. Patient taking differently: Take 64 mg by mouth daily in the afternoon. 11/18/21   [name], DO  Multiple Vitamin (MULTIVITAMIN) capsule Take 2 capsules by mouth daily.    [provider]  mupirocin ointment (BACTROBAN) 2 % Apply 1 Application topically 2 (two) times daily. Patient not taking: Reported on 04/19/2022 04/09/22   [name], DO  ondansetron (ZOFRAN-ODT) 8 MG disintegrating tablet Dissolve 1 tablet (8 mg total) by mouth every 8 (eight) hours as needed for nausea. 04/09/22   [name], DO  pantoprazole (PROTONIX) 40 MG tablet Take 1 tablet (40 mg total) by mouth daily. 11/20/21 11/20/22  Sharyn Creamer, MD  potassium chloride SA (KLOR-CON M) 20 MEQ tablet Take 1 tablet (20 mEq total) by mouth daily. 03/04/22 04/19/22  Geradine Girt, DO  saccharomyces boulardii (FLORASTOR) 250 MG capsule Take 250 mg by mouth 2 (two) times daily.     [provider]  spironolactone (ALDACTONE) 25 MG tablet Take 1 tablet (25 mg total) by mouth daily. 04/13/22   [name], DO   vortioxetine HBr (TRINTELLIX) 10 MG TABS tablet Take 1 tablet (10 mg total) by mouth daily. 09/02/21   [name], DO     Objective    Physical Exam: Vitals:   05/04/22 2345 05/05/22 0000 05/05/22 0100 05/05/22 0115  BP: 105/65 104/67 118/82 116/81  Pulse: 85 86 91 84  Resp: 17 18 17 18   Temp:      TempSrc:      SpO2: 98%  97% 91% 95%  Weight:      Height:        General: appears to be stated age; alert, oriented Skin: warm, dry, no rash Head:  AT/[place] Mouth:  Oral mucosa membranes appear dry, normal dentition Neck: supple; trachea midline Heart:  RRR; did not appreciate any M/R/G Lungs: CTAB, did not appreciate any wheezes, rales, or rhonchi Abdomen: + BS; soft, ND, NT Vascular: 2+ pedal pulses b/l; 2+ radial pulses b/l Extremities: no peripheral edema, no muscle wasting Neuro: strength and sensation intact in upper and lower extremities b/l    Labs on Admission: I have personally reviewed following labs and imaging studies  CBC: Recent Labs  Lab 05/04/22 2325 05/04/22 2332  WBC 7.3  --   NEUTROABS 5.3  --   HGB 9.5* 10.5*  HCT 28.4* 31.0*  MCV 86.1  --   PLT 46*  --    Basic Metabolic Panel: Recent Labs  Lab 05/04/22 2325 05/04/22 2332  NA 135 136  K 2.5* 2.6*  CL 101 99  CO2 17*  --   GLUCOSE 143* 136*  BUN 7 5*  CREATININE 1.11 0.90  CALCIUM 7.7*  --   MG 0.6*  --    GFR: Estimated Creatinine Clearance: 85.1 mL/min (by C-G formula based on SCr of 0.9 mg/dL). Liver Function Tests: Recent Labs  Lab 05/04/22 2325  AST 79*  ALT 26  ALKPHOS 69  BILITOT 4.1*  PROT 6.5  ALBUMIN 2.6*   No results for input(s): "LIPASE", "AMYLASE" in the last 168 hours. No results for input(s): "AMMONIA" in the last 168 hours. Coagulation Profile: No results for input(s): "INR", "PROTIME" in the last 168 hours. Cardiac Enzymes: No results for input(s): "CKTOTAL", "CKMB", "CKMBINDEX", "TROPONINI" in the last 168 hours. BNP (last 3 results) No results for  input(s): "PROBNP" in the last 8760 hours. HbA1C: No results for input(s): "HGBA1C" in the last 72 hours. CBG: No results for input(s): "GLUCAP" in the last 168 hours. Lipid Profile: No results for input(s): "CHOL", "HDL", "LDLCALC", "TRIG", "CHOLHDL", "LDLDIRECT" in the last 72 hours. Thyroid Function Tests: No results for input(s): "TSH", "T4TOTAL", "FREET4", "T3FREE", "THYROIDAB" in the last 72 hours. Anemia Panel: No results for input(s): "VITAMINB12", "FOLATE", "FERRITIN", "TIBC", "IRON", "RETICCTPCT" in the last 72 hours. Urine analysis:    Component Value Date/Time   COLORURINE YELLOW 09/20/2021 1230   APPEARANCEUR CLEAR 09/20/2021 1230   LABSPEC 1.006 09/20/2021 1230   PHURINE 8.0 09/20/2021 1230   GLUCOSEU NEGATIVE 09/20/2021 1230   HGBUR LARGE (A) 09/20/2021 1230   BILIRUBINUR NEGATIVE 09/20/2021 1230   BILIRUBINUR small (A) 04/17/2021 1646   KETONESUR NEGATIVE 09/20/2021 1230   PROTEINUR NEGATIVE 09/20/2021 1230   UROBILINOGEN 4.0 (A) 04/17/2021 1646   NITRITE NEGATIVE 09/20/2021 [place] 09/20/2021 1230    Radiological Exams on Admission: No results found.   EKG: Independently reviewed, with result as described above.    Assessment/Plan   Principal Problem:   Syncope Active Problems:   Hypokalemia   Hypomagnesemia   Chronic alcoholic liver disease (HCC)   Nausea vomiting and diarrhea   GERD (gastroesophageal reflux disease)   Prolonged QT interval   High anion gap metabolic acidosis   Anemia of chronic disease         #) Syncope: 1 episode of syncope, associated with prodrome, earlier in the evening, which appears consistent with orthostatic hypotension given that occurred shortly after the patient resisting position for prolonged supine position, with likely  exacerbation due to intravascular depletion as a consequence of recent GI losses superimposed on a likely element of suboptimal oncotic pressure as a consequence of  underlying cirrhosis.  Given the presence of prodrome, ventricular arrhythmia appears less likely, although he is at increased risk for development of such given his significant electrolyte abnormalities, notably hypomagnesemia and hypokalemia as well as QTc prolongation identified on presenting EKG.  Will strive for optimization of his electrolyte abnormalities, while monitoring on telemetry and assessing orthostatic vital signs, with the caveat that the patient is already received IV fluids from both EMS as well as in the ED. patient also reports a new heart murmur over the last few months.  Consequently, will also order echocardiogram for the morning.  No evidence to suggest seizures, and no acute focal neurologic deficits to increase index of suspicion for acute stroke.  Clinically, acute pulmonary embolism is also less likely at this time.  Plan: Nursing communication order placed requesting orthostatic vital signs be checked and documented.  Monitor on telemetry.  Monitor strict I's and O's and daily weights.  Further evaluation management of hypokalemia and hypomagnesemia, as further detailed below.  Echocardiogram in the morning.  Chest x-ray.  Repeat CBC in the morning.  Fall precautions ordered.         #) Hypokalemia: Presenting serum potassium level 2.5, for which the patient has received a total of 30 mill colons of IV potassium chloride thus far.  It appears that there is an element of chronicity to his hypokalemia with exacerbation due to recent increase in GI losses as well as concomitant diminished ability to take his oral potassium supplement and spironolactone over the last few days as a consequence.    Plan: Potassium chloride 40 mEq p.o. every 4 hours x2 doses.  Repeat CMP in the morning.  Further evaluation management of concomitant hypomagnesemia, as below.  Monitor on telemetry.         #) Hypomagnesemia: Presenting serum magnesium level 0.6.  Of important note is  concomitant QTc prolongation on presenting EKG.  In the setting of recent increase in GI losses, with potential additional contribution from chronic use of Protonix.  He has received 2 g of IV magnesium sulfate in the ED thus far, with repeat serum magnesium level improving to 0.3.  Plan: We will provide an additional 2 g of IV magnesium sulfate at this time.  Monitor on telemetry.  Hold home Protonix for now.            #) QTc prolongation: Presenting EKG demonstrates QTc of 542 ms. outpatient medications that may be contributing to QTc prolongation: Vortioxetine.  Presenting hypomagnesemia also notable, as above   Plan: Monitor on telemetry.  Supplementation of hypomagnesemia, as above repeat EKG in the morning to monitor interval degree of QTc prolongation.  Hold outpatient  Vortioxetine.         #) Nausea, vomiting, loose stool: 2-3 daily episodes of nonbloody, nonbilious emesis as well as loose stool 2 to 3 days, in the absence of any evidence of GI bleed.  The symptoms appear to be improving and appear more suggestive of viral gastroenteritis given nausea/vomiting slightly preceded timing of onset of the loose stool.  Of note, no evidence of acute peritoneal signs on physical exam.  Given that the symptoms appear to be improving independently, will refrain from initiation stool studies at this time, but with potential for reconsideration should they subsequently worsen again.    Plan: In the setting of QTc  prolongation, will order prn IV Ativan as antiemetic.  Monitor strict I's and O's and daily weights.  Further evaluation management of presenting hypokalemia and hypomagnesemia, as above.  Repeat CMP and CBC in the morning.         #) Anion gap metabolic acidosis, as evidenced on CMP.  Suspect that this is on the basis of the patient's liver failure noting presenting INR to be 2.2.  No evidence of underlying infectious process to suggest an element of sepsis, and renal  function appears to be at baseline.  We will also add on serum ethanol level and urine drug screen.   Plan: Repeat CMP in the morning.  Add on serum ethanol level.  Check urinary drug screen.  Repeat INR in the morning.  Repeat CBC in the morning.          #) Alcoholic cirrhosis: Documented history of such, with the patient reporting complete abstinence from alcohol for slightly more than 200 days. Complicated by portal hypertension as well as chronic anemia/thrombocytopenia.  Is on spironolactone as an outpatient, but in the absence of Lasix.  Interval increase in total bilirubin as well as INR compared to corresponding labs from August and July 2023, respectively, with presenting MELD score calculated to be 23 associated with estimated 59-monthmortality of 19.6%.     Plan: Monitor strict I's and O's and daily weights.  Repeat CMP, CBC, and INR in the AM. Add on serum ethanol level.  Check direct bilirubin.  In the context of suspected syncope as result of orthostatic hypotension from intravascular depletion, will hold next dose of spironolactone, pending review of results of orthostatic vital signs.       #) Chronic anemia: In the setting of underlying cirrhosis, with baseline hemoglobin noted to be 9-11, present hemoglobin consistent with his baseline and in the absence of evidence of active bleed.  Plan: Repeat CBC in the morning.  Repeat INR at that time as well.         #) Chronic thrombocytopenia as consequence of cirrhosis, platelet count appearing slightly lower than most recent prior, but still close to baseline.  Not on any blood thinners including no antiplatelet medications at home.  Plan: Repeat CBC in the morning.  Refraining from pharmacologic DVT prophylaxis.  SCDs.        #) GERD: Documented history of such, on Protonix as an outpatient.  While the patient is at increased risk for GI bleed, including upper GI bleed particular given his document history of  portal hypertension, will hold next dose of Protonix due to potential side effect of exacerbation of hypomagnesemia, given presenting serum magnesium level of 0.6 with concomitant evidence of QTc prolongation after presenting with episode of syncope.  Plan: Hold home Protonix for now.  Further evaluation management of hypomagnesemia, as above.      DVT prophylaxis: SCD's   Code Status: Full code Family Communication: I discussed patient's case with his wife, who is present at bedside Disposition Plan: Per Rounding Team Consults called: none;  Admission status: Observation    PLEASE NOTE THAT DRAGON DICTATION SOFTWARE WAS USED IN THE CONSTRUCTION OF THIS NOTE.   JRiversideDO [facility]  From [employer]  05/05/2022, 2:03 AM

## 2022-05-05 NOTE — Progress Notes (Signed)
  Echocardiogram 2D Echocardiogram has been performed.  Lana Fish 05/05/2022, 9:38 AM

## 2022-05-05 NOTE — ED Notes (Signed)
Petrucelli PA made aware of pts critical potassium 2.5 and magnesium 0.6, PA to relay message to [facility] MD

## 2022-05-05 NOTE — Care Management (Addendum)
  Transition of Care [facility]) Screening Note   Patient Details  Name: Juan [name] Date of Birth: 04-06-1966   Transition of Care [facility]) CM/SW Contact:    Bethena Roys, RN Phone Number: 05/05/2022, 12:06 PM    Transition of Care Department [facility]) has reviewed the patient and no TOC needs have been identified at this time. Patient is a English as a second language teacher. Case Manager called [place] Coordinator to make them aware of hospitalization. Case Manager will follow for transition of care needs as the patient progresses.   05-05-22 1336 Case Manager spoke with the [employer] Coordinator and the patient is from the [place] Team PCP is Dr. Vonna Drafts with [facility] [phone] ext [phone].

## 2022-05-05 NOTE — Progress Notes (Signed)
  Progress Note   Patient: Juan [name] ATF:[id number] DOB: 08-26-1965 DOA: 05/04/2022     0 DOS: the patient was seen and examined on 05/05/2022   Brief hospital course: 56 year old man PMH alcoholic cirrhosis, portal hypertension, chronic thrombocytopenia and anemia, presented with a syncopal episode, admitted for vasovagal syncope and in the context of vomiting, diarrhea, poor oral intake and multiple electrolyte abnormalities.  Assessment and Plan: Vasovagal syncope in the context of vomiting, diarrhea, poor oral intake with multiple electrolyte abnormalities. -- 2D echocardiogram reassuring.  Hemodynamics stable.  Telemetry sinus rhythm. -- Continue to replete electrolytes  Hypokalemia, hypophosphatemia, hypomagnesemia -- Replete.  Secondary to poor oral intake, nausea vomiting and diarrhea.  Nausea, vomiting, diarrhea -- Spontaneously resolved.  Presumably viral gastroenteritis.  Alcoholic cirrhosis with chronic thrombocytopenia, portal hypertension, anemia, coagulopathy -- Appears stable.  Prolonged QT -- Exacerbated by electrolyte abnormalities.  Trending down.  Repeat EKG in AM.  Urine drug screen positive for marijuana -- Recommend cessation     Subjective:  Feels better Diarrhea resolved No n/v today Is moving and in doing so packed up supplemental potassium and other supplements  Physical Exam: Vitals:   05/05/22 0807 05/05/22 1107 05/05/22 1246 05/05/22 1617  BP: 96/64  97/69 (!) 94/59  Pulse: 86 95 91 91  Resp: 18 20 18 19   Temp: 98.2 F (36.8 C)  97.9 F (36.6 C) 98.3 F (36.8 C)  TempSrc: Oral  Oral Oral  SpO2: 98% 94% 99% 94%  Weight:      Height:       Physical Exam Vitals reviewed.  Constitutional:      General: He is not in acute distress.    Appearance: He is not ill-appearing or toxic-appearing.  Cardiovascular:     Rate and Rhythm: Normal rate and regular rhythm.     Heart sounds: No murmur heard. Pulmonary:     Effort: Pulmonary  effort is normal. No respiratory distress.     Breath sounds: No wheezing, rhonchi or rales.  Neurological:     Mental Status: He is alert.  Psychiatric:        Mood and Affect: Mood normal.        Behavior: Behavior normal.     Data Reviewed:  K+ 3.4 Phos 1.3 Mg 1.7 Hgb 8.7 Plts 42 INR 2.2 UDS THC+ EKG with prolonged QT  Family Communication: none  Disposition: Status is: Observation   Planned Discharge Destination: Home    Time spent: 25 minutes  Author: Murray Hodgkins, MD 05/05/2022 7:42 PM  For on call review [email or web address].

## 2022-05-05 NOTE — ED Notes (Signed)
Pt sating 88-89% on RA after receiving ativan. Pt placed on 3L [place] and is now sating 98%. Will continue to monitor.

## 2022-05-05 NOTE — Hospital Course (Addendum)
56 year old man PMH alcoholic cirrhosis, portal hypertension, chronic thrombocytopenia and anemia, presented with a syncopal episode, admitted for vasovagal syncope and in the context of vomiting, diarrhea, poor oral intake and multiple electrolyte abnormalities.

## 2022-05-06 ENCOUNTER — Telehealth: Payer: Self-pay

## 2022-05-06 DIAGNOSIS — F411 Generalized anxiety disorder: Secondary | ICD-10-CM | POA: Diagnosis present

## 2022-05-06 DIAGNOSIS — R112 Nausea with vomiting, unspecified: Secondary | ICD-10-CM | POA: Diagnosis not present

## 2022-05-06 DIAGNOSIS — Z91018 Allergy to other foods: Secondary | ICD-10-CM | POA: Diagnosis not present

## 2022-05-06 DIAGNOSIS — Z888 Allergy status to other drugs, medicaments and biological substances status: Secondary | ICD-10-CM | POA: Diagnosis not present

## 2022-05-06 DIAGNOSIS — D689 Coagulation defect, unspecified: Secondary | ICD-10-CM | POA: Diagnosis present

## 2022-05-06 DIAGNOSIS — R9431 Abnormal electrocardiogram [ECG] [EKG]: Secondary | ICD-10-CM | POA: Diagnosis present

## 2022-05-06 DIAGNOSIS — Z881 Allergy status to other antibiotic agents status: Secondary | ICD-10-CM | POA: Diagnosis not present

## 2022-05-06 DIAGNOSIS — E8809 Other disorders of plasma-protein metabolism, not elsewhere classified: Secondary | ICD-10-CM | POA: Diagnosis present

## 2022-05-06 DIAGNOSIS — Z91013 Allergy to seafood: Secondary | ICD-10-CM | POA: Diagnosis not present

## 2022-05-06 DIAGNOSIS — K219 Gastro-esophageal reflux disease without esophagitis: Secondary | ICD-10-CM | POA: Diagnosis present

## 2022-05-06 DIAGNOSIS — E872 Acidosis, unspecified: Secondary | ICD-10-CM | POA: Diagnosis present

## 2022-05-06 DIAGNOSIS — E739 Lactose intolerance, unspecified: Secondary | ICD-10-CM | POA: Diagnosis present

## 2022-05-06 DIAGNOSIS — E876 Hypokalemia: Secondary | ICD-10-CM | POA: Diagnosis present

## 2022-05-06 DIAGNOSIS — A084 Viral intestinal infection, unspecified: Secondary | ICD-10-CM | POA: Diagnosis present

## 2022-05-06 DIAGNOSIS — K766 Portal hypertension: Secondary | ICD-10-CM | POA: Diagnosis present

## 2022-05-06 DIAGNOSIS — F32A Depression, unspecified: Secondary | ICD-10-CM | POA: Diagnosis present

## 2022-05-06 DIAGNOSIS — I10 Essential (primary) hypertension: Secondary | ICD-10-CM | POA: Diagnosis present

## 2022-05-06 DIAGNOSIS — D696 Thrombocytopenia, unspecified: Secondary | ICD-10-CM | POA: Diagnosis present

## 2022-05-06 DIAGNOSIS — D638 Anemia in other chronic diseases classified elsewhere: Secondary | ICD-10-CM | POA: Diagnosis present

## 2022-05-06 DIAGNOSIS — R55 Syncope and collapse: Secondary | ICD-10-CM | POA: Diagnosis present

## 2022-05-06 DIAGNOSIS — K7030 Alcoholic cirrhosis of liver without ascites: Secondary | ICD-10-CM | POA: Diagnosis present

## 2022-05-06 DIAGNOSIS — F17210 Nicotine dependence, cigarettes, uncomplicated: Secondary | ICD-10-CM | POA: Diagnosis present

## 2022-05-06 DIAGNOSIS — F4310 Post-traumatic stress disorder, unspecified: Secondary | ICD-10-CM | POA: Diagnosis present

## 2022-05-06 LAB — BASIC METABOLIC PANEL
Anion gap: 8 (ref 5–15)
Anion gap: 6 (ref 5–15)
BUN: <5 mg/dL — ABNORMAL LOW (ref 6–20)
BUN: <5 mg/dL — ABNORMAL LOW (ref 6–20)
CO2: 20 mmol/L — ABNORMAL LOW (ref 22–32)
CO2: 22 mmol/L (ref 22–32)
Calcium: 7.9 mg/dL — ABNORMAL LOW (ref 8.9–10.3)
Calcium: 7.8 mg/dL — ABNORMAL LOW (ref 8.9–10.3)
Chloride: 108 mmol/L (ref 98–111)
Chloride: 108 mmol/L (ref 98–111)
Creatinine, Ser: 0.91 mg/dL (ref 0.61–1.24)
Creatinine, Ser: 0.91 mg/dL (ref 0.61–1.24)
GFR, Estimated: >60 mL/min (ref >60)
GFR, Estimated: >60 mL/min (ref >60)
Glucose, Bld: 108 mg/dL — ABNORMAL HIGH (ref 70–99)
Glucose, Bld: 162 mg/dL — ABNORMAL HIGH (ref 70–99)
Potassium: 3.4 mmol/L — ABNORMAL LOW (ref 3.5–5.1)
Potassium: 4.7 mmol/L (ref 3.5–5.1)
Sodium: 136 mmol/L (ref 135–145)
Sodium: 136 mmol/L (ref 135–145)

## 2022-05-06 LAB — MAGNESIUM
Magnesium: 1.5 mg/dL — ABNORMAL LOW (ref 1.7–2.4)
Magnesium: 2.2 mg/dL (ref 1.7–2.4)

## 2022-05-06 LAB — PHOSPHORUS
Phosphorus: 1.8 mg/dL — ABNORMAL LOW (ref 2.5–4.6)
Phosphorus: 2.9 mg/dL (ref 2.5–4.6)

## 2022-05-06 MED ORDER — PROCHLORPERAZINE EDISYLATE 10 MG/2ML IJ SOLN
10.0000 mg | Freq: Four times a day (QID) | INTRAMUSCULAR | Status: DC | PRN
  Administered 2022-05-06 – 2022-05-07 (×2): 10 mg via INTRAVENOUS
  Filled 2022-05-06 (×2): qty 2

## 2022-05-06 MED ORDER — MAGNESIUM SULFATE 4 GM/100ML IV SOLN
4.0000 g | Freq: Once | INTRAVENOUS | Status: AC
  Administered 2022-05-06: 4 g via INTRAVENOUS
  Filled 2022-05-06: qty 100

## 2022-05-06 MED ORDER — POTASSIUM CHLORIDE 20 MEQ PO PACK
40.0000 meq | PACK | ORAL | Status: AC
  Administered 2022-05-06 (×2): 40 meq via ORAL
  Filled 2022-05-06 (×2): qty 2

## 2022-05-06 MED ORDER — LACTATED RINGERS IV SOLN: INTRAVENOUS | Status: DC

## 2022-05-06 MED ORDER — POTASSIUM PHOSPHATES 15 MMOLE/5ML IV SOLN
30.0000 mmol | Freq: Once | INTRAVENOUS | Status: AC
  Administered 2022-05-06: 30 mmol via INTRAVENOUS
  Filled 2022-05-06: qty 10

## 2022-05-06 NOTE — Progress Notes (Signed)
  Progress Note   Patient: Juan [name] WUG:[id number] DOB: 10/12/1965 DOA: 05/04/2022     0 DOS: the patient was seen and examined on 05/06/2022   Brief hospital course: 56 year old man PMH alcoholic cirrhosis, portal hypertension, chronic thrombocytopenia and anemia, presented with a syncopal episode, admitted for vasovagal syncope and in the context of vomiting, diarrhea, poor oral intake and multiple electrolyte abnormalities.  Assessment and Plan: Vasovagal syncope in the context of vomiting, diarrhea, poor oral intake with multiple electrolyte abnormalities. -- 2D echocardiogram reassuring.  Hemodynamics stable. Telemetry sinus rhythm. -- Continue to replete electrolytes -No further evaluation suggested.-   Hypokalemia, hypophosphatemia, hypomagnesemia -- Replete.  Secondary to poor oral intake, nausea vomiting and diarrhea. --Given recurrence of diarrhea, check electrolytes in AM.   Nausea, vomiting, diarrhea -- Vomiting resolved.  Presumably viral gastroenteritis.  Has some nausea and poor oral intake today.   Alcoholic cirrhosis with chronic thrombocytopenia, portal hypertension, anemia, coagulopathy -- Appears stable.   Prolonged QT -- Exacerbated by electrolyte abnormalities.  Trending down.  Repeat EKG in AM.   Urine drug screen positive for marijuana -- Recommend cessation     Subjective:  Diarrhea started up again overnight, some nausea but no vomiting.  Poor oral intake.  Stool is fairly formed.  No blood.  Some abdominal cramping.  Physical Exam: Vitals:   05/05/22 2210 05/06/22 0406 05/06/22 0700 05/06/22 1700  BP: 100/72 101/66  98/62  Pulse: 97 83  91  Resp: 18 19 20 18   Temp: 99.1 F (37.3 C) 98.7 F (37.1 C)  97.9 F (36.6 C)  TempSrc: Oral Oral  Oral  SpO2: 98% 98% 98% 96%  Weight:  72.6 kg    Height:       Physical Exam Vitals reviewed.  Constitutional:      General: He is not in acute distress.    Appearance: He is not ill-appearing or  toxic-appearing.  Cardiovascular:     Rate and Rhythm: Normal rate and regular rhythm.     Heart sounds: No murmur heard. Pulmonary:     Effort: Pulmonary effort is normal. No respiratory distress.     Breath sounds: No wheezing, rhonchi or rales.  Abdominal:     General: There is distension.     Palpations: Abdomen is soft.     Tenderness: There is abdominal tenderness.  Musculoskeletal:     Right lower leg: No edema.     Left lower leg: No edema.  Neurological:     Mental Status: He is alert.  Psychiatric:        Mood and Affect: Mood normal.        Behavior: Behavior normal.     Data Reviewed:  BMP this p.m. shows potassium is now 4.7.  Phosphorus up to 2.9, magnesium up to 2.2 status post repletion.  Family Communication: none  Disposition: Status is: Inpatient Remains inpatient appropriate because: diarrhea, poor oral intake  Planned Discharge Destination: Home    Time spent: 20 minutes  Author: Murray Hodgkins, MD 05/06/2022 5:40 PM  For on call review [email or web address].

## 2022-05-06 NOTE — Telephone Encounter (Signed)
[facility] transplant appointment scheduled for 06-24-2022.

## 2022-05-07 DIAGNOSIS — R55 Syncope and collapse: Secondary | ICD-10-CM | POA: Diagnosis not present

## 2022-05-07 DIAGNOSIS — R112 Nausea with vomiting, unspecified: Secondary | ICD-10-CM | POA: Diagnosis not present

## 2022-05-07 DIAGNOSIS — E876 Hypokalemia: Secondary | ICD-10-CM | POA: Diagnosis not present

## 2022-05-07 LAB — BASIC METABOLIC PANEL
Anion gap: 7 (ref 5–15)
BUN: <5 mg/dL — ABNORMAL LOW (ref 6–20)
CO2: 20 mmol/L — ABNORMAL LOW (ref 22–32)
Calcium: 7.7 mg/dL — ABNORMAL LOW (ref 8.9–10.3)
Chloride: 110 mmol/L (ref 98–111)
Creatinine, Ser: 0.94 mg/dL (ref 0.61–1.24)
GFR, Estimated: >60 mL/min (ref >60)
Glucose, Bld: 115 mg/dL — ABNORMAL HIGH (ref 70–99)
Potassium: 4.1 mmol/L (ref 3.5–5.1)
Sodium: 137 mmol/L (ref 135–145)

## 2022-05-07 LAB — GASTROINTESTINAL PANEL BY PCR, STOOL (REPLACES STOOL CULTURE)

## 2022-05-07 LAB — PHOSPHORUS: Phosphorus: 2.3 mg/dL — ABNORMAL LOW (ref 2.5–4.6)

## 2022-05-07 LAB — MAGNESIUM: Magnesium: 1.7 mg/dL (ref 1.7–2.4)

## 2022-05-07 MED ORDER — MAGNESIUM SULFATE 2 GM/50ML IV SOLN
2.0000 g | Freq: Once | INTRAVENOUS | Status: AC
  Administered 2022-05-07: 2 g via INTRAVENOUS
  Filled 2022-05-07: qty 50

## 2022-05-07 MED ORDER — POTASSIUM PHOSPHATES 15 MMOLE/5ML IV SOLN
30.0000 mmol | Freq: Once | INTRAVENOUS | Status: AC
  Administered 2022-05-07: 30 mmol via INTRAVENOUS
  Filled 2022-05-07: qty 10

## 2022-05-07 NOTE — Progress Notes (Signed)
Patient had large emesis after taking a few bites of lunch, states breakfast came up.  Continues to feel sick to stomach.  Compazine IV given per prn order.

## 2022-05-07 NOTE — Discharge Summary (Signed)
Physician Discharge Summary   Patient: Juan [name] MRN: [id number] DOB: [birth year]  Admit date:     05/04/2022  Discharge date: 05/07/22  Discharge Physician: Murray Hodgkins   PCP: [name], DO   Recommendations at discharge:   Ongoing care for chronic medical problems  Discharge Diagnoses: Principal Problem:   Syncope Active Problems:   Hypokalemia   Hypomagnesemia   Chronic alcoholic liver disease (HCC)   Nausea vomiting and diarrhea   GERD (gastroesophageal reflux disease)   Prolonged QT interval   High anion gap metabolic acidosis   Anemia of chronic disease  Resolved Problems:   * No resolved hospital problems. *  Hospital Course: 56 year old man PMH alcoholic cirrhosis, portal hypertension, chronic thrombocytopenia and anemia, presented with a syncopal episode, admitted for vasovagal syncope and in the context of vomiting, diarrhea, poor oral intake and multiple electrolyte abnormalities.  Condition gradually improved with resolution of diarrhea.  Had some intermittent vomiting but tolerated dinner and requested discharge home.  Vasovagal syncope in the context of vomiting, diarrhea, poor oral intake with multiple electrolyte abnormalities. -- 2D echocardiogram reassuring.  Hemodynamics stable. Telemetry sinus rhythm. -- Continue to replete electrolytes -No further evaluation suggested.   Hypokalemia, hypophosphatemia, hypomagnesemia -- Repleted.  Secondary to poor oral intake, nausea vomiting and diarrhea.   Nausea, vomiting, diarrhea -- Vomiting intermittent but tolerated dinner requested discharge home.  Presumably viral gastroenteritis.  Diarrhea resolved.  GI pathogen panel was negative.   Alcoholic cirrhosis with chronic thrombocytopenia, portal hypertension, anemia, coagulopathy -- Appears stable.   Prolonged QT -- Exacerbated by electrolyte abnormalities.  Trending down.  Repeat EKG in AM.   Urine drug screen positive for marijuana --  Recommend cessation     Consultants:  None  Procedures performed:  None   Disposition: Home Diet recommendation:  Cardiac diet DISCHARGE MEDICATION: Allergies as of 05/07/2022       Reactions   Apple Swelling   Apple Juice Swelling   Swelling  of tongue   Cucumber Extract Itching, Nausea And Vomiting   No extracts; just cucumber   Depakote Er [divalproex Sodium Er] Swelling   Tongue swelling   Peanut Butter Flavor Anaphylaxis, Swelling   Peanut Oil Swelling   Shellfish Allergy Itching, Swelling   Strawberry Extract Nausea And Vomiting, Swelling   Cantaloupe Extract Allergy Skin Test Rash   Lactose Other (See Comments)   Lactose intolerant - causes indigestion Stomach pain/gas   Vancomycin Rash   Codeine Itching, Rash        Medication List     STOP taking these medications    mupirocin ointment 2 % Commonly known as: BACTROBAN       TAKE these medications    acetaminophen 650 MG CR tablet Commonly known as: TYLENOL Take 650 mg by mouth daily as needed for pain.   aspirin EC 81 MG tablet Take 81 mg by mouth daily. Swallow whole.   B COMPLEX PO Take 1 tablet by mouth every morning.   busPIRone 15 MG tablet Commonly known as: BUSPAR Take 1 tablet (15 mg total) by mouth 2 (two) times daily.   Cholecalciferol 50 MCG (2000 UT) Caps Take 4,000 Units by mouth daily.   folic acid 1 MG tablet Commonly known as: FOLVITE TAKE 1 TABLET (1 MG TOTAL) BY MOUTH DAILY. What changed: how much to take   Magnesium Chloride 64 MG Tabs Take 2 tablets by mouth in the morning and at bedtime. What changed:  how much to take when to take  this   multivitamin capsule Take 2 capsules by mouth daily.   ondansetron 8 MG disintegrating tablet Commonly known as: ZOFRAN-ODT Dissolve 1 tablet (8 mg total) by mouth every 8 (eight) hours as needed for nausea.   pantoprazole 40 MG tablet Commonly known as: PROTONIX Take 1 tablet (40 mg total) by mouth daily.    potassium chloride SA 20 MEQ tablet Commonly known as: KLOR-CON M Take 1 tablet (20 mEq total) by mouth daily.   saccharomyces boulardii 250 MG capsule Commonly known as: FLORASTOR Take 250 mg by mouth 2 (two) times daily.   spironolactone 25 MG tablet Commonly known as: ALDACTONE Take 1 tablet (25 mg total) by mouth daily.   Trintellix 10 MG Tabs tablet Generic drug: vortioxetine HBr Take 1 tablet (10 mg total) by mouth daily.       Feels better Diarrhea resolved No nausea Tolerating diet Discharge Exam: Filed Weights   05/05/22 0640 05/06/22 0406 05/07/22 0500  Weight: 73.3 kg 72.6 kg 72.8 kg   Physical Exam Vitals reviewed.  Constitutional:      General: He is not in acute distress.    Appearance: He is not ill-appearing or toxic-appearing.  Cardiovascular:     Rate and Rhythm: Normal rate and regular rhythm.     Heart sounds: No murmur heard. Pulmonary:     Effort: Pulmonary effort is normal. No respiratory distress.     Breath sounds: No wheezing, rhonchi or rales.  Neurological:     Mental Status: He is alert.  Psychiatric:        Mood and Affect: Mood normal.        Behavior: Behavior normal.      Condition at discharge: good  The results of significant diagnostics from this hospitalization (including imaging, microbiology, ancillary and laboratory) are listed below for reference.   Imaging Studies: ECHOCARDIOGRAM COMPLETE  Result Date: 05/05/2022    ECHOCARDIOGRAM REPORT   Patient Name:   Juan [name] Date of Exam: 05/05/2022 Medical Rec #:  [id number]       Height:       63.0 in Accession #:    [id number]      Weight:       161.6 lb Date of Birth:  01/07/1966       BSA:          1.766 m Patient Age:    58 years        BP:           97/66 mmHg Patient Gender: M               HR:           91 bpm. Exam Location:  Inpatient Procedure: 2D Echo Indications:    syncope  History:        Patient has prior history of Echocardiogram examinations, most                  recent 01/11/2022. Alcohol myopathy, non-rheumatic AS,                 Signs/Symptoms:Murmur, Chest Pain and Syncope; Risk                 Factors:Hypertension.  Sonographer:    Harvie Junior Referring Phys: [id number] [place]  1. Left ventricular ejection fraction, by estimation, is 60 to 65%. The left ventricle has normal function. The left ventricle has no regional wall motion abnormalities. Left ventricular diastolic parameters are consistent with  Grade II diastolic dysfunction (pseudonormalization).  2. Right ventricular systolic function is normal. The right ventricular size is normal. There is normal pulmonary artery systolic pressure. The estimated right ventricular systolic pressure is 81.8 mmHg.  3. Left atrial size was mildly dilated.  4. Right atrial size was mildly dilated.  5. The mitral valve is normal in structure. Trivial mitral valve regurgitation. No evidence of mitral stenosis.  6. The aortic valve is tricuspid. There is moderate calcification of the aortic valve. Aortic valve regurgitation is not visualized. Mild to moderate aortic valve stenosis. Aortic valve mean gradient measures 20.0 mmHg.  7. The inferior vena cava is normal in size with <50% respiratory variability, suggesting right atrial pressure of 8 mmHg. FINDINGS  Left Ventricle: Left ventricular ejection fraction, by estimation, is 60 to 65%. The left ventricle has normal function. The left ventricle has no regional wall motion abnormalities. The left ventricular internal cavity size was normal in size. There is  no left ventricular hypertrophy. Left ventricular diastolic parameters are consistent with Grade II diastolic dysfunction (pseudonormalization). Right Ventricle: The right ventricular size is normal. No increase in right ventricular wall thickness. Right ventricular systolic function is normal. There is normal pulmonary artery systolic pressure. The tricuspid regurgitant velocity is 1.91 m/s, and   with an assumed right atrial pressure of 8 mmHg, the estimated right ventricular systolic pressure is 29.9 mmHg. Left Atrium: Left atrial size was mildly dilated. Right Atrium: Right atrial size was mildly dilated. Pericardium: Trivial pericardial effusion is present. Mitral Valve: The mitral valve is normal in structure. Mild mitral annular calcification. Trivial mitral valve regurgitation. No evidence of mitral valve stenosis. Tricuspid Valve: The tricuspid valve is normal in structure. Tricuspid valve regurgitation is trivial. Aortic Valve: The aortic valve is tricuspid. There is moderate calcification of the aortic valve. Aortic valve regurgitation is not visualized. Mild to moderate aortic stenosis is present. Aortic valve mean gradient measures 20.0 mmHg. Aortic valve peak gradient measures 28.7 mmHg. Aortic valve area, by VTI measures 2.47 cm. Pulmonic Valve: The pulmonic valve was normal in structure. Pulmonic valve regurgitation is not visualized. Aorta: The aortic root is normal in size and structure. Venous: The inferior vena cava is normal in size with less than 50% respiratory variability, suggesting right atrial pressure of 8 mmHg. IAS/Shunts: No atrial level shunt detected by color flow Doppler.  LEFT VENTRICLE PLAX 2D LVIDd:         4.70 cm      Diastology LVIDs:         3.10 cm      LV e' medial:    8.38 cm/s LV PW:         1.10 cm      LV E/e' medial:  12.2 LV IVS:        1.10 cm      LV e' lateral:   14.10 cm/s LVOT diam:     2.30 cm      LV E/e' lateral: 7.2 LV SV:         124 LV SV Index:   70 LVOT Area:     4.15 cm  LV Volumes (MOD) LV vol d, MOD A2C: 105.0 ml LV vol d, MOD A4C: 119.0 ml LV vol s, MOD A2C: 36.2 ml LV vol s, MOD A4C: 47.3 ml LV SV MOD A2C:     68.8 ml LV SV MOD A4C:     119.0 ml LV SV MOD BP:      71.0 ml  RIGHT VENTRICLE RV Basal diam:  4.40 cm RV Mid diam:    3.60 cm RV S prime:     23.60 cm/s TAPSE (M-mode): 2.5 cm LEFT ATRIUM           Index        RIGHT ATRIUM            Index LA diam:      3.90 cm 2.21 cm/m   RA Area:     14.90 cm LA Vol (A2C): 57.0 ml 32.27 ml/m  RA Volume:   38.90 ml  22.03 ml/m LA Vol (A4C): 61.6 ml 34.88 ml/m  AORTIC VALVE                     PULMONIC VALVE AV Area (Vmax):    2.09 cm      PV Vmax:       1.06 m/s AV Area (Vmean):   1.99 cm      PV Peak grad:  4.5 mmHg AV Area (VTI):     2.47 cm AV Vmax:           267.80 cm/s AV Vmean:          203.750 cm/s AV VTI:            0.501 m AV Peak Grad:      28.7 mmHg AV Mean Grad:      20.0 mmHg LVOT Vmax:         135.00 cm/s LVOT Vmean:        97.600 cm/s LVOT VTI:          0.298 m LVOT/AV VTI ratio: 0.60  AORTA Ao Root diam: 3.50 cm Ao Asc diam:  3.10 cm MITRAL VALVE                TRICUSPID VALVE MV Area (PHT): 3.61 cm     TR Peak grad:   14.6 mmHg MV Decel Time: 210 msec     TR Vmax:        191.00 cm/s MR Peak grad: 24.9 mmHg MR Vmax:      249.50 cm/s   SHUNTS MV E velocity: 102.00 cm/s  Systemic VTI:  0.30 m MV A velocity: 87.00 cm/s   Systemic Diam: 2.30 cm MV E/A ratio:  1.17 Dalton McleanMD Electronically signed by Franki Monte Signature Date/Time: 05/05/2022/2:05:54 PM    Final    DG CHEST PORT 1 VIEW  Result Date: 05/05/2022 CLINICAL DATA:  Syncope. EXAM: PORTABLE CHEST 1 VIEW COMPARISON:  01/10/2022 FINDINGS: 0439 hours. Right lung clear. Mild left base atelectasis or infiltrate noted. Cardiopericardial silhouette is at upper limits of normal for size. The visualized bony structures of the thorax are unremarkable. Telemetry leads overlie the chest. IMPRESSION: Mild left base atelectasis or infiltrate. Electronically Signed   By: Misty Stanley M.D.   On: 05/05/2022 05:12    Microbiology: Results for orders placed or performed during the hospital encounter of 05/04/22  Gastrointestinal Panel by PCR , Stool     Status: None   Collection Time: 05/06/22  2:26 PM   Specimen: Stool  Result Value Ref Range Status   Campylobacter species NOT DETECTED NOT DETECTED Final   Plesimonas  shigelloides NOT DETECTED NOT DETECTED Final   Salmonella species NOT DETECTED NOT DETECTED Final   Yersinia enterocolitica NOT DETECTED NOT DETECTED Final   Vibrio species NOT DETECTED NOT DETECTED Final   Vibrio cholerae NOT DETECTED NOT DETECTED Final   Enteroaggregative E  coli (EAEC) NOT DETECTED NOT DETECTED Final   Enteropathogenic E coli (EPEC) NOT DETECTED NOT DETECTED Final   Enterotoxigenic E coli (ETEC) NOT DETECTED NOT DETECTED Final   Shiga like toxin producing E coli (STEC) NOT DETECTED NOT DETECTED Final   Shigella/Enteroinvasive E coli (EIEC) NOT DETECTED NOT DETECTED Final   Cryptosporidium NOT DETECTED NOT DETECTED Final   Cyclospora cayetanensis NOT DETECTED NOT DETECTED Final   Entamoeba histolytica NOT DETECTED NOT DETECTED Final   Giardia lamblia NOT DETECTED NOT DETECTED Final   Adenovirus F40/41 NOT DETECTED NOT DETECTED Final   Astrovirus NOT DETECTED NOT DETECTED Final   Norovirus GI/GII NOT DETECTED NOT DETECTED Final   Rotavirus A NOT DETECTED NOT DETECTED Final   Sapovirus (I, II, IV, and V) NOT DETECTED NOT DETECTED Final    Comment: Performed at [facility], [address] [place], [place] [address]    Labs: CBC: Recent Labs  Lab 05/04/22 2325 05/04/22 2332 05/05/22 0313  WBC 7.3  --  7.0  NEUTROABS 5.3  --  5.0  HGB 9.5* 10.5* 8.7*  HCT 28.4* 31.0* 25.2*  MCV 86.1  --  85.7  PLT 46*  --  42*   Basic Metabolic Panel: Recent Labs  Lab 05/05/22 0313 05/05/22 0938 05/06/22 0228 05/06/22 1624 05/07/22 0213  NA 135 133* 136 136 137  K 2.6* 3.4* 3.4* 4.7 4.1  CL 105 106 108 108 110  CO2 21* 21* 20* 22 20*  GLUCOSE 129* 114* 108* 162* 115*  BUN 6 <5* <5* <5* <5*  CREATININE 0.93 0.91 0.91 0.91 0.94  CALCIUM 7.5* 7.3* 7.9* 7.8* 7.7*  MG 1.3* 1.7 1.5* 2.2 1.7  PHOS  --  1.3* 1.8* 2.9 2.3*   Liver Function Tests: Recent Labs  Lab 05/04/22 2325 05/05/22 0313  AST 79* 69*  ALT 26 24  ALKPHOS 69 60  BILITOT 4.1* 3.6*  PROT  6.5 5.7*  ALBUMIN 2.6* 2.4*   CBG: No results for input(s): "GLUCAP" in the last 168 hours.  Discharge time spent: less than 30 minutes.  Signed: Murray Hodgkins, MD [facility] 05/07/2022

## 2022-05-26 DIAGNOSIS — G4700 Insomnia, unspecified: Secondary | ICD-10-CM | POA: Diagnosis not present

## 2022-05-26 DIAGNOSIS — Z62810 Personal history of physical and sexual abuse in childhood: Secondary | ICD-10-CM | POA: Diagnosis not present

## 2022-05-26 DIAGNOSIS — F1021 Alcohol dependence, in remission: Secondary | ICD-10-CM | POA: Diagnosis not present

## 2022-05-26 DIAGNOSIS — F411 Generalized anxiety disorder: Secondary | ICD-10-CM | POA: Diagnosis not present

## 2022-05-28 DIAGNOSIS — R32 Unspecified urinary incontinence: Secondary | ICD-10-CM | POA: Diagnosis not present

## 2022-05-28 DIAGNOSIS — R351 Nocturia: Secondary | ICD-10-CM | POA: Diagnosis not present

## 2022-06-24 DIAGNOSIS — R188 Other ascites: Secondary | ICD-10-CM | POA: Diagnosis not present

## 2022-06-24 DIAGNOSIS — K7460 Unspecified cirrhosis of liver: Secondary | ICD-10-CM | POA: Diagnosis not present

## 2022-06-25 ENCOUNTER — Other Ambulatory Visit (HOSPITAL_BASED_OUTPATIENT_CLINIC_OR_DEPARTMENT_OTHER): Payer: Self-pay

## 2022-06-25 ENCOUNTER — Encounter: Payer: Self-pay | Admitting: Internal Medicine

## 2022-06-25 ENCOUNTER — Other Ambulatory Visit: Payer: Self-pay | Admitting: Family Medicine

## 2022-06-25 ENCOUNTER — Other Ambulatory Visit: Payer: Self-pay

## 2022-06-25 MED ORDER — LACTULOSE ENCEPHALOPATHY 10 GM/15ML PO SOLN
20.0000 g | Freq: Every day | ORAL | 0 refills | Status: AC
  Filled 2022-06-25: qty 900, 30d supply, fill #0

## 2022-06-25 MED ORDER — POTASSIUM CHLORIDE CRYS ER 20 MEQ PO TBCR
20.0000 meq | EXTENDED_RELEASE_TABLET | Freq: Every day | ORAL | 3 refills | Status: DC
  Filled 2022-06-25: qty 30, 30d supply, fill #0
  Filled 2022-07-20: qty 30, 30d supply, fill #1
  Filled 2022-09-02: qty 30, 30d supply, fill #2

## 2022-06-28 ENCOUNTER — Ambulatory Visit: Payer: Non-veteran care | Admitting: Dietician

## 2022-06-28 ENCOUNTER — Other Ambulatory Visit (HOSPITAL_BASED_OUTPATIENT_CLINIC_OR_DEPARTMENT_OTHER): Payer: Self-pay

## 2022-06-29 ENCOUNTER — Encounter (HOSPITAL_BASED_OUTPATIENT_CLINIC_OR_DEPARTMENT_OTHER): Payer: Self-pay | Admitting: Emergency Medicine

## 2022-06-29 ENCOUNTER — Emergency Department (HOSPITAL_BASED_OUTPATIENT_CLINIC_OR_DEPARTMENT_OTHER)
Admission: EM | Admit: 2022-06-29 | Discharge: 2022-06-30 | Disposition: A | Discharge status: Home / Self Care | Payer: No Typology Code available for payment source | Attending: Emergency Medicine | Admitting: Emergency Medicine

## 2022-06-29 DIAGNOSIS — E876 Hypokalemia: Secondary | ICD-10-CM | POA: Diagnosis not present

## 2022-06-29 DIAGNOSIS — R5383 Other fatigue: Secondary | ICD-10-CM | POA: Insufficient documentation

## 2022-06-29 DIAGNOSIS — Z91010 Allergy to peanuts: Secondary | ICD-10-CM | POA: Diagnosis not present

## 2022-06-29 DIAGNOSIS — R110 Nausea: Secondary | ICD-10-CM | POA: Diagnosis not present

## 2022-06-29 DIAGNOSIS — R42 Dizziness and giddiness: Secondary | ICD-10-CM | POA: Diagnosis not present

## 2022-06-29 DIAGNOSIS — Z7982 Long term (current) use of aspirin: Secondary | ICD-10-CM | POA: Diagnosis not present

## 2022-06-29 DIAGNOSIS — R7401 Elevation of levels of liver transaminase levels: Secondary | ICD-10-CM | POA: Diagnosis not present

## 2022-06-29 DIAGNOSIS — R0602 Shortness of breath: Secondary | ICD-10-CM | POA: Diagnosis not present

## 2022-06-29 LAB — COMPREHENSIVE METABOLIC PANEL
ALT: 28 U/L (ref 0–44)
AST: 87 U/L — ABNORMAL HIGH (ref 15–41)
Albumin: 2.7 g/dL — ABNORMAL LOW (ref 3.5–5.0)
Alkaline Phosphatase: 72 U/L (ref 38–126)
Anion gap: 9 (ref 5–15)
BUN: 11 mg/dL (ref 6–20)
CO2: 26 mmol/L (ref 22–32)
Calcium: 8.2 mg/dL — ABNORMAL LOW (ref 8.9–10.3)
Chloride: 105 mmol/L (ref 98–111)
Creatinine, Ser: 0.69 mg/dL (ref 0.61–1.24)
GFR, Estimated: >60 mL/min (ref >60)
Glucose, Bld: 115 mg/dL — ABNORMAL HIGH (ref 70–99)
Potassium: 2.9 mmol/L — ABNORMAL LOW (ref 3.5–5.1)
Sodium: 140 mmol/L (ref 135–145)
Total Bilirubin: 2 mg/dL — ABNORMAL HIGH (ref 0.3–1.2)
Total Protein: 6.6 g/dL (ref 6.5–8.1)

## 2022-06-29 LAB — AMMONIA: Ammonia: 74 umol/L — ABNORMAL HIGH (ref 9–35)

## 2022-06-29 LAB — CBC
HCT: 26.9 % — ABNORMAL LOW (ref 39.0–52.0)
Hemoglobin: 8.8 g/dL — ABNORMAL LOW (ref 13.0–17.0)
MCH: 28.9 pg (ref 26.0–34.0)
MCHC: 32.7 g/dL (ref 30.0–36.0)
MCV: 88.2 fL (ref 80.0–100.0)
Platelets: 59 10*3/uL — ABNORMAL LOW (ref 150–400)
RBC: 3.05 MIL/uL — ABNORMAL LOW (ref 4.22–5.81)
RDW: 18.2 % — ABNORMAL HIGH (ref 11.5–15.5)
WBC: 5.3 10*3/uL (ref 4.0–10.5)
nRBC: 0 % (ref 0.0–0.2)

## 2022-06-29 MED ORDER — POTASSIUM CHLORIDE CRYS ER 20 MEQ PO TBCR
40.0000 meq | EXTENDED_RELEASE_TABLET | Freq: Once | ORAL | Status: AC
  Administered 2022-06-29: 40 meq via ORAL
  Filled 2022-06-29: qty 2

## 2022-06-29 MED ORDER — SODIUM CHLORIDE 0.9 % IV BOLUS
1000.0000 mL | Freq: Once | INTRAVENOUS | Status: AC
  Administered 2022-06-29: 1000 mL via INTRAVENOUS

## 2022-06-29 MED ORDER — LACTULOSE 10 GM/15ML PO SOLN
20.0000 g | Freq: Once | ORAL | Status: AC
  Administered 2022-06-29: 20 g via ORAL
  Filled 2022-06-29: qty 30

## 2022-06-29 NOTE — ED Provider Notes (Signed)
[facility] Provider Note   CSN: [id number] Arrival date & time: 06/29/22  2148     History  Chief Complaint  Patient presents with   Fatigue   Dizziness    Juan [name] is a 56 y.o. male with a PMHx of cirrhosis who presents to the ED complaining of fatigue and dizziness onset 1 week. Has associated shortness of breath, confusion, nausea. No meds tried PTA. Denies ETOH use. Pt has a follow up appointment tomorrow. Denies chest pain, shortness of breath, vomiting, diarrhea, LOC.   The history is provided by the patient. No language interpreter was used.       Home Medications Prior to Admission medications   Medication Sig Start Date End Date Taking? Authorizing Provider  acetaminophen (TYLENOL) 650 MG CR tablet Take 650 mg by mouth daily as needed for pain.    [provider]  aspirin EC 81 MG tablet Take 81 mg by mouth daily. Swallow whole.    [provider]  B Complex Vitamins (B COMPLEX PO) Take 1 tablet by mouth every morning.    [provider]  busPIRone (BUSPAR) 15 MG tablet Take 1 tablet (15 mg total) by mouth 2 (two) times daily. 04/20/22 07/19/22  [name], DO  Cholecalciferol 50 MCG (2000 UT) CAPS Take 4,000 Units by mouth daily.    [provider]  folic acid (FOLVITE) 1 MG tablet TAKE 1 TABLET (1 MG TOTAL) BY MOUTH DAILY. Patient taking differently: Take 1 mg by mouth daily. 06/22/21 07/22/22  Silverio Decamp, MD  lactulose, encephalopathy, (CHRONULAC) 10 GM/15ML SOLN Take 30 mLs (20 g total) by mouth daily. 06/25/22 07/28/22  Sharyn Creamer, MD  Magnesium Chloride 64 MG TABS Take 2 tablets by mouth in the morning and at bedtime. Patient taking differently: Take 64 mg by mouth daily in the afternoon. 11/18/21   [name], DO  Multiple Vitamin (MULTIVITAMIN) capsule Take 2 capsules by mouth daily.    [provider]  ondansetron (ZOFRAN-ODT) 8 MG disintegrating tablet  Dissolve 1 tablet (8 mg total) by mouth every 8 (eight) hours as needed for nausea. 04/09/22   [name], DO  pantoprazole (PROTONIX) 40 MG tablet Take 1 tablet (40 mg total) by mouth daily. 11/20/21 11/20/22  Sharyn Creamer, MD  potassium chloride SA (KLOR-CON M) 20 MEQ tablet Take 1 tablet (20 mEq total) by mouth daily. 06/25/22 07/25/22  [name], DO  saccharomyces boulardii (FLORASTOR) 250 MG capsule Take 250 mg by mouth 2 (two) times daily.     [provider]  spironolactone (ALDACTONE) 25 MG tablet Take 1 tablet (25 mg total) by mouth daily. 04/13/22   [name], DO  vortioxetine HBr (TRINTELLIX) 10 MG TABS tablet Take 1 tablet (10 mg total) by mouth daily. 09/02/21   [name], DO      Allergies    Apple, Apple juice, Codeine, Cucumber extract, Depakote er [divalproex sodium er], Peanut butter flavor, Peanut oil, Peanut-containing drug products, Shellfish allergy, Shrimp extract allergy skin test, Strawberry extract, Valproic acid, Cantaloupe extract allergy skin test, Lactose, and Vancomycin    Review of Systems   Review of Systems  Respiratory:  Positive for shortness of breath.   Cardiovascular:  Negative for chest pain.  Neurological:  Positive for dizziness.  All other systems reviewed and are negative.   Physical Exam Updated Vital Signs BP 117/79 (BP Location: Left Arm)   Pulse 85   Temp 98.3 F (36.8 C) (Oral)  Resp 20   Ht 5' 3"  (1.6 m)   Wt 73.5 kg   SpO2 94%   BMI 28.70 kg/m  Physical Exam Vitals and nursing note reviewed.  Constitutional:      General: He is not in acute distress.    Appearance: He is not diaphoretic.  HENT:     Head: Normocephalic and atraumatic.     Mouth/Throat:     Pharynx: No oropharyngeal exudate.  Eyes:     General: No scleral icterus.    Conjunctiva/sclera: Conjunctivae normal.  Cardiovascular:     Rate and Rhythm: Normal rate and regular rhythm.     Pulses: Normal pulses.     Heart sounds: Normal heart  sounds.  Pulmonary:     Effort: Pulmonary effort is normal. No respiratory distress.     Breath sounds: Normal breath sounds. No wheezing.  Abdominal:     General: Bowel sounds are normal.     Palpations: Abdomen is soft. There is no mass.     Tenderness: There is no abdominal tenderness. There is no guarding or rebound.  Musculoskeletal:        General: Normal range of motion.     Cervical back: Normal range of motion and neck supple.  Skin:    General: Skin is warm and dry.  Neurological:     Mental Status: He is alert.  Psychiatric:        Behavior: Behavior normal.     ED Results / Procedures / Treatments   Labs (all labs ordered are listed, but only abnormal results are displayed) Labs Reviewed  COMPREHENSIVE METABOLIC PANEL  CBC  AMMONIA    EKG EKG Interpretation  Date/Time:  Tuesday June 29 2022 22:15:03 EST Ventricular Rate:  81 PR Interval:  158 QRS Duration: 94 QT Interval:  418 QTC Calculation: 485 R Axis:   18 Text Interpretation: Normal sinus rhythm Cannot rule out Inferior infarct , age undetermined Cannot rule out Anterior infarct , age undetermined Abnormal ECG No significant change since last tracing Confirmed by Deno Etienne [phone]) on 06/29/2022 10:16:31 PM  Radiology No results found.  Procedures Procedures    Medications Ordered in ED Medications - No data to display  ED Course/ Medical Decision Making/ A&P Clinical Course as of 06/29/22 2308  Tue Jun 29, 2022  2307 Case discussed with attending who evaluated patient [SB]    Clinical Course User Index [SB] Laksh Hinners A, PA-C                           Medical Decision Making Amount and/or Complexity of Data Reviewed Labs: ordered.  Risk Prescription drug management.   Pt presents with *** onset ***. {History of it?}. {Brief denies statement, denies sick contacts, similar symptoms}. Vital signs, ***. On exam, pt with ***. No acute cardiovascular, respiratory, abdominal exam  findings. Differential diagnosis includes ***.    Co morbidities that complicate the patient evaluation: cirrhosis  Additional history obtained:  Additional history obtained from {sabhistory:27144} External records from outside source obtained and reviewed including: ***  Labs:  I ordered, and personally interpreted labs.  The pertinent results include:   {sablabs:28098}  Imaging: I ordered imaging studies including {sabimaging:28099} I independently visualized and interpreted imaging which showed: *** I agree with the radiologist interpretation  Medications:  I ordered medication including {sabmeds:28100} for *** Reevaluation of the patient after these medicines and interventions, I reevaluated the patient and found that they have {resolved/improved/worsened:23923::"improved"}  I have reviewed the patients home medicines and have made adjustments as needed   {Consultations: I requested consultation with the {sabspecialists:27145}, and discussed lab and imaging findings as well as pertinent plan - they recommend: ***}  Social Determinants of Health: ***   Disposition: {End of MDM here with the likely diagnosis}. After consideration of the diagnostic results and the patients response to treatment, I feel that the patient would benefit from {sabdispo:27146}. Supportive care measures and strict return precautions discussed with patient at bedside. Pt acknowledges and verbalizes understanding. Pt appears safe for discharge. Follow up as indicated in discharge paperwork.    This chart was dictated using voice recognition software, Dragon. Despite the best efforts of this provider to proofread and correct errors, errors may still occur which can change documentation meaning.   Final Clinical Impression(s) / ED Diagnoses Final diagnoses:  None    Rx / DC Orders ED Discharge Orders     None

## 2022-06-29 NOTE — ED Triage Notes (Signed)
Pt reports lightheadedness, fatigue, difficulty focusing, confusion and low BP x 1 week. Hx of liver cirrhosis. Reports recent low K+ and told to double his home dose.

## 2022-06-30 ENCOUNTER — Ambulatory Visit: Payer: Non-veteran care | Admitting: Family Medicine

## 2022-06-30 NOTE — Discharge Instructions (Signed)
It is important that you take your lactulose as prescribed.  Ensure to maintain fluid intake.  Maintain your follow-up appointment as scheduled tomorrow.  Return to the emergency department if you experience increasing/worsening symptoms.

## 2022-07-01 ENCOUNTER — Ambulatory Visit (INDEPENDENT_AMBULATORY_CARE_PROVIDER_SITE_OTHER): Payer: No Typology Code available for payment source | Admitting: Sports Medicine

## 2022-07-01 ENCOUNTER — Encounter: Payer: Self-pay | Admitting: Sports Medicine

## 2022-07-01 ENCOUNTER — Other Ambulatory Visit (HOSPITAL_BASED_OUTPATIENT_CLINIC_OR_DEPARTMENT_OTHER): Payer: Self-pay

## 2022-07-01 VITALS — BP 133/81 | HR 94

## 2022-07-01 DIAGNOSIS — R197 Diarrhea, unspecified: Secondary | ICD-10-CM | POA: Diagnosis not present

## 2022-07-01 DIAGNOSIS — R112 Nausea with vomiting, unspecified: Secondary | ICD-10-CM | POA: Diagnosis not present

## 2022-07-01 LAB — CBC
HCT: 30.2 % — ABNORMAL LOW (ref 38.5–50.0)
Hemoglobin: 10.1 g/dL — ABNORMAL LOW (ref 13.2–17.1)
MCH: 29.4 pg (ref 27.0–33.0)
MCHC: 33.4 g/dL (ref 32.0–36.0)
MCV: 87.8 fL (ref 80.0–100.0)
MPV: 9.7 fL (ref 7.5–12.5)
Platelets: 60 10*3/uL — ABNORMAL LOW (ref 140–400)
RBC: 3.44 10*6/uL — ABNORMAL LOW (ref 4.20–5.80)
RDW: 16.7 % — ABNORMAL HIGH (ref 11.0–15.0)
WBC: 3.9 10*3/uL (ref 3.8–10.8)

## 2022-07-01 LAB — COMPLETE METABOLIC PANEL WITH GFR
AG Ratio: 0.9 (calc) — ABNORMAL LOW (ref 1.0–2.5)
ALT: 23 U/L (ref 9–46)
AST: 68 U/L — ABNORMAL HIGH (ref 10–35)
Albumin: 3.4 g/dL — ABNORMAL LOW (ref 3.6–5.1)
Alkaline phosphatase (APISO): 72 U/L (ref 35–144)
BUN: 9 mg/dL (ref 7–25)
CO2: 24 mmol/L (ref 20–32)
Calcium: 8.1 mg/dL — ABNORMAL LOW (ref 8.6–10.3)
Chloride: 98 mmol/L (ref 98–110)
Creat: 0.8 mg/dL (ref 0.70–1.30)
Globulin: 3.6 g/dL (calc) (ref 1.9–3.7)
Glucose, Bld: 105 mg/dL — ABNORMAL HIGH (ref 65–99)
Potassium: 3.2 mmol/L — ABNORMAL LOW (ref 3.5–5.3)
Sodium: 136 mmol/L (ref 135–146)
Total Bilirubin: 4.7 mg/dL — ABNORMAL HIGH (ref 0.2–1.2)
Total Protein: 7 g/dL (ref 6.1–8.1)
eGFR: 105 mL/min/{1.73_m2} (ref >=60)

## 2022-07-01 LAB — AMYLASE: Amylase: 54 U/L (ref 21–101)

## 2022-07-01 LAB — LIPASE: Lipase: 72 U/L — ABNORMAL HIGH (ref 7–60)

## 2022-07-01 MED ORDER — PROMETHAZINE HCL 25 MG/ML IJ SOLN
25.0000 mg | Freq: Once | INTRAMUSCULAR | Status: AC
  Administered 2022-07-01: 25 mg via INTRAMUSCULAR

## 2022-07-01 MED ORDER — PROCHLORPERAZINE MALEATE 10 MG PO TABS
10.0000 mg | ORAL_TABLET | Freq: Four times a day (QID) | ORAL | 3 refills | Status: DC | PRN
  Filled 2022-07-01: qty 30, 8d supply, fill #0
  Filled 2022-11-18: qty 30, 8d supply, fill #1

## 2022-07-01 NOTE — Addendum Note (Signed)
Addended by: Tarri Glenn A on: 07/01/2022 01:30 PM   Modules accepted: Orders

## 2022-07-01 NOTE — Progress Notes (Signed)
    Procedures performed today:    22-gauge butterfly left cubital vein for blood draw performed by physician.  Independent interpretation of notes and tests performed by another provider:   None.  Brief History, Exam, Impression, and Recommendations:    Nausea vomiting and diarrhea Pleasant 56 year old male, history of cirrhosis, increasing nausea and vomiting after a visit to [facility]. No fevers, chills, negative COVID test. Exam is for the most part benign. Having difficulty keeping down liquids. He is still making urine. We will give him a shot of Phenergan IM, adding Compazine. If unable to keep fluids down by later in the day I will bring him back and run some lactated Ringer's. In the meantime I would like to get a CBC, CMP, amylase, lipase.    ____________________________________________ Gwen Her. Dianah Field, M.D., ABFM., CAQSM., AME. Primary Care and Sports Medicine [facility]  Adjunct Professor of [place] of [facility] of [employer]  Risk manager

## 2022-07-01 NOTE — Assessment & Plan Note (Signed)
Pleasant 56 year old male, history of cirrhosis, increasing nausea and vomiting after a visit to [facility]. No fevers, chills, negative COVID test. Exam is for the most part benign. Having difficulty keeping down liquids. He is still making urine. We will give him a shot of Phenergan IM, adding Compazine. If unable to keep fluids down by later in the day I will bring him back and run some lactated Ringer's. In the meantime I would like to get a CBC, CMP, amylase, lipase.

## 2022-07-20 ENCOUNTER — Other Ambulatory Visit (HOSPITAL_BASED_OUTPATIENT_CLINIC_OR_DEPARTMENT_OTHER): Payer: Self-pay

## 2022-07-20 ENCOUNTER — Other Ambulatory Visit: Payer: Self-pay | Admitting: Family Medicine

## 2022-07-20 ENCOUNTER — Other Ambulatory Visit: Payer: Self-pay | Admitting: Internal Medicine

## 2022-07-20 MED ORDER — PANTOPRAZOLE SODIUM 40 MG PO TBEC
40.0000 mg | DELAYED_RELEASE_TABLET | Freq: Every day | ORAL | 1 refills | Status: DC
  Filled 2022-07-20: qty 60, 60d supply, fill #0
  Filled 2022-09-20: qty 60, 60d supply, fill #1

## 2022-07-26 ENCOUNTER — Encounter: Payer: Self-pay | Admitting: Sports Medicine

## 2022-07-26 ENCOUNTER — Other Ambulatory Visit (HOSPITAL_BASED_OUTPATIENT_CLINIC_OR_DEPARTMENT_OTHER): Payer: Self-pay

## 2022-07-26 ENCOUNTER — Ambulatory Visit (INDEPENDENT_AMBULATORY_CARE_PROVIDER_SITE_OTHER): Payer: No Typology Code available for payment source | Admitting: Sports Medicine

## 2022-07-26 VITALS — Wt 167.0 lb

## 2022-07-26 DIAGNOSIS — M5030 Other cervical disc degeneration, unspecified cervical region: Secondary | ICD-10-CM

## 2022-07-26 DIAGNOSIS — M5136 Other intervertebral disc degeneration, lumbar region: Secondary | ICD-10-CM | POA: Diagnosis not present

## 2022-07-26 DIAGNOSIS — M51369 Other intervertebral disc degeneration, lumbar region without mention of lumbar back pain or lower extremity pain: Secondary | ICD-10-CM

## 2022-07-26 HISTORY — DX: Other intervertebral disc degeneration, lumbar region without mention of lumbar back pain or lower extremity pain: M51.369

## 2022-07-26 MED ORDER — PREDNISONE 50 MG PO TABS
50.0000 mg | ORAL_TABLET | Freq: Every day | ORAL | 0 refills | Status: AC
  Filled 2022-07-26: qty 5, 5d supply, fill #0

## 2022-07-26 NOTE — Assessment & Plan Note (Signed)
Axial low back pain with right-sided radicular symptoms to the knee, posterior button thigh, better with spinal extension. This is classic discogenic back pain, 5 days of prednisone as above, home health physical therapy. Return to see me in 6 weeks, MR for interventional planning if not better.

## 2022-07-26 NOTE — Assessment & Plan Note (Signed)
Recurrence of cervical pain, with periscapular radicular symptoms, he did have a cervical epidural back in the summertime. Ordering a burst of prednisone, and home health physical therapy, if this does not do the trick we will proceed with another cervical epidural and potentially neuropathic agents, however we need to be very careful with neuropathic agents considering history of hepatic encephalopathy and excessive sedation.

## 2022-07-26 NOTE — Progress Notes (Signed)
    Procedures performed today:    None.  Independent interpretation of notes and tests performed by another provider:   I did personally review a CT abdomen and pelvis from a few months ago, there is an L5-S1 protruding disc as well as a left severe L5-S1 facet arthropathy.  Brief History, Exam, Impression, and Recommendations:    DDD (degenerative disc disease), cervical Recurrence of cervical pain, with periscapular radicular symptoms, he did have a cervical epidural back in the summertime. Ordering a burst of prednisone, and home health physical therapy, if this does not do the trick we will proceed with another cervical epidural and potentially neuropathic agents, however we need to be very careful with neuropathic agents considering history of hepatic encephalopathy and excessive sedation.  Lumbar degenerative disc disease Axial low back pain with right-sided radicular symptoms to the knee, posterior button thigh, better with spinal extension. This is classic discogenic back pain, 5 days of prednisone as above, home health physical therapy. Return to see me in 6 weeks, MR for interventional planning if not better.    ____________________________________________ Gwen Her. Dianah Field, M.D., ABFM., CAQSM., AME. Primary Care and Sports Medicine [facility]  Adjunct Professor of [place] [facility] [employer]  Risk manager

## 2022-08-04 ENCOUNTER — Telehealth: Payer: Self-pay

## 2022-08-04 NOTE — Telephone Encounter (Signed)
Hey Juan [name] his referral was sent to hospice care and Juan [name] isn't hospice care can we please find another facility but does home health physical therapy thank you for all you do.

## 2022-08-06 NOTE — Telephone Encounter (Signed)
I've looked this over and am inquiring with [facility] currently, as it is in the area of patient's residence for the [facility] PT as requested. Apologies for the mix-up! Juan [name]

## 2022-08-06 NOTE — Telephone Encounter (Signed)
Thank you and no problem at all.

## 2022-08-16 ENCOUNTER — Inpatient Hospital Stay: Admit: 2022-08-16 | Payer: Non-veteran care | Admitting: Internal Medicine

## 2022-08-16 ENCOUNTER — Encounter (HOSPITAL_BASED_OUTPATIENT_CLINIC_OR_DEPARTMENT_OTHER): Payer: Self-pay

## 2022-08-16 ENCOUNTER — Inpatient Hospital Stay (HOSPITAL_BASED_OUTPATIENT_CLINIC_OR_DEPARTMENT_OTHER)
Admission: EM | Admit: 2022-08-16 | Discharge: 2022-08-18 | DRG: 394 | Disposition: A | Discharge status: Home / Self Care | Payer: No Typology Code available for payment source | Attending: Family Medicine | Admitting: Family Medicine

## 2022-08-16 ENCOUNTER — Encounter (HOSPITAL_COMMUNITY): Payer: Self-pay

## 2022-08-16 ENCOUNTER — Other Ambulatory Visit: Payer: Self-pay

## 2022-08-16 DIAGNOSIS — K449 Diaphragmatic hernia without obstruction or gangrene: Secondary | ICD-10-CM | POA: Diagnosis present

## 2022-08-16 DIAGNOSIS — R748 Abnormal levels of other serum enzymes: Secondary | ICD-10-CM | POA: Diagnosis present

## 2022-08-16 DIAGNOSIS — K922 Gastrointestinal hemorrhage, unspecified: Secondary | ICD-10-CM

## 2022-08-16 DIAGNOSIS — Z91010 Allergy to peanuts: Secondary | ICD-10-CM | POA: Diagnosis not present

## 2022-08-16 DIAGNOSIS — Z8616 Personal history of COVID-19: Secondary | ICD-10-CM

## 2022-08-16 DIAGNOSIS — K7682 Hepatic encephalopathy: Secondary | ICD-10-CM | POA: Diagnosis present

## 2022-08-16 DIAGNOSIS — Z86718 Personal history of other venous thrombosis and embolism: Secondary | ICD-10-CM

## 2022-08-16 DIAGNOSIS — F32A Depression, unspecified: Secondary | ICD-10-CM | POA: Diagnosis present

## 2022-08-16 DIAGNOSIS — E876 Hypokalemia: Secondary | ICD-10-CM | POA: Diagnosis present

## 2022-08-16 DIAGNOSIS — J9811 Atelectasis: Secondary | ICD-10-CM | POA: Diagnosis present

## 2022-08-16 DIAGNOSIS — K7030 Alcoholic cirrhosis of liver without ascites: Secondary | ICD-10-CM | POA: Diagnosis present

## 2022-08-16 DIAGNOSIS — Z79899 Other long term (current) drug therapy: Secondary | ICD-10-CM

## 2022-08-16 DIAGNOSIS — K648 Other hemorrhoids: Principal | ICD-10-CM

## 2022-08-16 DIAGNOSIS — Z833 Family history of diabetes mellitus: Secondary | ICD-10-CM

## 2022-08-16 DIAGNOSIS — Z91013 Allergy to seafood: Secondary | ICD-10-CM | POA: Diagnosis not present

## 2022-08-16 DIAGNOSIS — Z91018 Allergy to other foods: Secondary | ICD-10-CM

## 2022-08-16 DIAGNOSIS — D62 Acute posthemorrhagic anemia: Secondary | ICD-10-CM | POA: Diagnosis present

## 2022-08-16 DIAGNOSIS — Z885 Allergy status to narcotic agent status: Secondary | ICD-10-CM | POA: Diagnosis not present

## 2022-08-16 DIAGNOSIS — Z8249 Family history of ischemic heart disease and other diseases of the circulatory system: Secondary | ICD-10-CM

## 2022-08-16 DIAGNOSIS — K8020 Calculus of gallbladder without cholecystitis without obstruction: Secondary | ICD-10-CM | POA: Diagnosis present

## 2022-08-16 DIAGNOSIS — Z888 Allergy status to other drugs, medicaments and biological substances status: Secondary | ICD-10-CM | POA: Diagnosis not present

## 2022-08-16 DIAGNOSIS — K7031 Alcoholic cirrhosis of liver with ascites: Secondary | ICD-10-CM | POA: Diagnosis present

## 2022-08-16 DIAGNOSIS — F419 Anxiety disorder, unspecified: Secondary | ICD-10-CM | POA: Diagnosis not present

## 2022-08-16 DIAGNOSIS — K3189 Other diseases of stomach and duodenum: Secondary | ICD-10-CM | POA: Diagnosis present

## 2022-08-16 DIAGNOSIS — D689 Coagulation defect, unspecified: Secondary | ICD-10-CM | POA: Diagnosis present

## 2022-08-16 DIAGNOSIS — K209 Esophagitis, unspecified without bleeding: Secondary | ICD-10-CM | POA: Diagnosis not present

## 2022-08-16 DIAGNOSIS — K766 Portal hypertension: Secondary | ICD-10-CM | POA: Diagnosis present

## 2022-08-16 DIAGNOSIS — D696 Thrombocytopenia, unspecified: Secondary | ICD-10-CM | POA: Diagnosis not present

## 2022-08-16 DIAGNOSIS — I10 Essential (primary) hypertension: Secondary | ICD-10-CM | POA: Diagnosis present

## 2022-08-16 DIAGNOSIS — J90 Pleural effusion, not elsewhere classified: Secondary | ICD-10-CM | POA: Diagnosis present

## 2022-08-16 DIAGNOSIS — K921 Melena: Secondary | ICD-10-CM | POA: Diagnosis present

## 2022-08-16 DIAGNOSIS — Z7982 Long term (current) use of aspirin: Secondary | ICD-10-CM

## 2022-08-16 DIAGNOSIS — F1020 Alcohol dependence, uncomplicated: Secondary | ICD-10-CM | POA: Diagnosis present

## 2022-08-16 DIAGNOSIS — F17210 Nicotine dependence, cigarettes, uncomplicated: Secondary | ICD-10-CM | POA: Diagnosis present

## 2022-08-16 DIAGNOSIS — G721 Alcoholic myopathy: Secondary | ICD-10-CM | POA: Diagnosis present

## 2022-08-16 DIAGNOSIS — R600 Localized edema: Secondary | ICD-10-CM | POA: Diagnosis present

## 2022-08-16 DIAGNOSIS — Z8719 Personal history of other diseases of the digestive system: Secondary | ICD-10-CM

## 2022-08-16 DIAGNOSIS — D6959 Other secondary thrombocytopenia: Secondary | ICD-10-CM | POA: Diagnosis present

## 2022-08-16 DIAGNOSIS — K625 Hemorrhage of anus and rectum: Secondary | ICD-10-CM | POA: Diagnosis not present

## 2022-08-16 DIAGNOSIS — G4733 Obstructive sleep apnea (adult) (pediatric): Secondary | ICD-10-CM | POA: Diagnosis present

## 2022-08-16 DIAGNOSIS — F4310 Post-traumatic stress disorder, unspecified: Secondary | ICD-10-CM | POA: Diagnosis present

## 2022-08-16 DIAGNOSIS — K2100 Gastro-esophageal reflux disease with esophagitis, without bleeding: Secondary | ICD-10-CM | POA: Diagnosis present

## 2022-08-16 DIAGNOSIS — K7210 Chronic hepatic failure without coma: Secondary | ICD-10-CM | POA: Diagnosis not present

## 2022-08-16 HISTORY — DX: Gastrointestinal hemorrhage, unspecified: K92.2

## 2022-08-16 LAB — CBC WITH DIFFERENTIAL/PLATELET
Abs Immature Granulocytes: 0.02 10*3/uL (ref 0.00–0.07)
Basophils Absolute: 0.1 10*3/uL (ref 0.0–0.1)
Basophils Relative: 1 %
Eosinophils Absolute: 0.3 10*3/uL (ref 0.0–0.5)
Eosinophils Relative: 6 %
HCT: 19 % — ABNORMAL LOW (ref 39.0–52.0)
Hemoglobin: 5.8 g/dL — CL (ref 13.0–17.0)
Immature Granulocytes: 0 %
Lymphocytes Relative: 23 %
Lymphs Abs: 1.2 10*3/uL (ref 0.7–4.0)
MCH: 26.4 pg (ref 26.0–34.0)
MCHC: 30.5 g/dL (ref 30.0–36.0)
MCV: 86.4 fL (ref 80.0–100.0)
Monocytes Absolute: 0.7 10*3/uL (ref 0.1–1.0)
Monocytes Relative: 14 %
Neutro Abs: 3 10*3/uL (ref 1.7–7.7)
Neutrophils Relative %: 56 %
Platelets: 41 10*3/uL — ABNORMAL LOW (ref 150–400)
RBC: 2.2 MIL/uL — ABNORMAL LOW (ref 4.22–5.81)
RDW: 17.5 % — ABNORMAL HIGH (ref 11.5–15.5)
WBC: 5.3 10*3/uL (ref 4.0–10.5)
nRBC: 0 % (ref 0.0–0.2)

## 2022-08-16 LAB — COMPREHENSIVE METABOLIC PANEL
ALT: 25 U/L (ref 0–44)
AST: 69 U/L — ABNORMAL HIGH (ref 15–41)
Albumin: 2.4 g/dL — ABNORMAL LOW (ref 3.5–5.0)
Alkaline Phosphatase: 57 U/L (ref 38–126)
Anion gap: 8 (ref 5–15)
BUN: 12 mg/dL (ref 6–20)
CO2: 23 mmol/L (ref 22–32)
Calcium: 8.4 mg/dL — ABNORMAL LOW (ref 8.9–10.3)
Chloride: 102 mmol/L (ref 98–111)
Creatinine, Ser: 0.81 mg/dL (ref 0.61–1.24)
GFR, Estimated: >60 mL/min (ref >60)
Glucose, Bld: 109 mg/dL — ABNORMAL HIGH (ref 70–99)
Potassium: 3.4 mmol/L — ABNORMAL LOW (ref 3.5–5.1)
Sodium: 133 mmol/L — ABNORMAL LOW (ref 135–145)
Total Bilirubin: 4.1 mg/dL — ABNORMAL HIGH (ref 0.3–1.2)
Total Protein: 5.8 g/dL — ABNORMAL LOW (ref 6.5–8.1)

## 2022-08-16 LAB — PROTIME-INR
INR: 2 — ABNORMAL HIGH (ref 0.8–1.2)
Prothrombin Time: 22.9 seconds — ABNORMAL HIGH (ref 11.4–15.2)

## 2022-08-16 MED ORDER — SODIUM CHLORIDE 0.9 % IV SOLN
2.0000 g | INTRAVENOUS | Status: DC
  Administered 2022-08-16 – 2022-08-18 (×3): 2 g via INTRAVENOUS
  Filled 2022-08-16 (×3): qty 20

## 2022-08-16 MED ORDER — INFLUENZA VAC SPLIT QUAD 0.5 ML IM SUSY: 0.5000 mL | PREFILLED_SYRINGE | INTRAMUSCULAR | Status: DC

## 2022-08-16 NOTE — ED Notes (Signed)
This morning at 8am Pt. Had a complete blood BM and last night he reports he had 3 Diarrhea bms.  Pt. Has history of Cirrhosis

## 2022-08-16 NOTE — ED Notes (Signed)
Pt. Attempted to have a bm but only a small amt of blood

## 2022-08-16 NOTE — ED Triage Notes (Signed)
Pt complains of rectal bleeding since 8 am. Abdominal cramping. Hx of cirrohis

## 2022-08-16 NOTE — Progress Notes (Signed)
56 year male with h/o alcoholic cirrhosis, portal hypertension, presents with hematochezia. S/p colonoscopy 09/2021 and EGD on 10/2021, .  On arrival to [facility], his hemoglobin is at 5.8 g/dl. Baseline hemoglobin runs between 8 to 10.  EDP unable to give prbc transfusion at  [facility].   Vitals:   08/16/22 1630 08/16/22 1700  BP: 111/73 105/68  Pulse: 86 84  Resp: 20 15  Temp:  98.7 F (37.1 C)  SpO2: 93% 96%   Requesting transfer to [facility] ED/admission/ floor for PRBC transfusion and admission .  GI consulted by EDP . Dr Bryan Lemma with [facility] aware.   Accepted to progressive bed at [facility].   Hosie Poisson, MD

## 2022-08-16 NOTE — ED Notes (Signed)
EDP noted to hold the CBC at present time

## 2022-08-16 NOTE — ED Provider Notes (Signed)
[facility] Provider Note   CSN: [id number] Arrival date & time: 08/16/22  1528     History  Chief Complaint  Patient presents with   Rectal Bleeding    Juan [name] is a 56 y.o. male.  Patient here with rectal bleeding.  He has had about 5-7 large episodes of bloody bowel movement.  History of cirrhosis.  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  Has had bleeding before in the past but he says no history of varices.  He has not had any bleeding for the last few hours.  He feels fatigued.  Denies any chest pain or abdominal pain or shortness of breath.  The history is provided by the patient.       Home Medications Prior to Admission medications   Medication Sig Start Date End Date Taking? Authorizing Provider  acetaminophen (TYLENOL) 650 MG CR tablet Take 650 mg by mouth daily as needed for pain.    [provider]  aspirin EC 81 MG tablet Take 81 mg by mouth daily. Swallow whole.    [provider]  Magnesium Chloride 64 MG TABS Take 2 tablets by mouth in the morning and at bedtime. Patient taking differently: Take 64 mg by mouth daily in the afternoon. 11/18/21   [name], DO  ondansetron (ZOFRAN-ODT) 8 MG disintegrating tablet Dissolve 1 tablet (8 mg total) by mouth every 8 (eight) hours as needed for nausea. 04/09/22   [name], DO  pantoprazole (PROTONIX) 40 MG tablet Take 1 tablet (40 mg total) by mouth daily. 07/20/22 07/20/23  Sharyn Creamer, MD  potassium chloride SA (KLOR-CON M) 20 MEQ tablet Take 1 tablet (20 mEq total) by mouth daily. 06/25/22 08/24/22  [name], DO  prochlorperazine (COMPAZINE) 10 MG tablet Take 1 tablet (10 mg total) by mouth every 6 (six) hours as needed for nausea or vomiting. 07/01/22   Silverio Decamp, MD  saccharomyces boulardii (FLORASTOR) 250 MG capsule Take 250 mg by mouth 2 (two) times daily.     [provider]  spironolactone (ALDACTONE) 25 MG tablet Take 1  tablet (25 mg total) by mouth daily. 04/13/22   [name], DO  vortioxetine HBr (TRINTELLIX) 10 MG TABS tablet Take 1 tablet (10 mg total) by mouth daily. 09/02/21   [name], DO      Allergies    Apple, Apple juice, Codeine, Cucumber extract, Depakote er [divalproex sodium er], Peanut butter flavor, Peanut oil, Peanut-containing drug products, Shellfish allergy, Shrimp extract allergy skin test, Strawberry extract, Valproic acid, Cantaloupe extract allergy skin test, Lactose, and Vancomycin    Review of Systems   Review of Systems  Gastrointestinal:  Positive for hematochezia.    Physical Exam Updated Vital Signs BP 105/68   Pulse 84   Temp 98.7 F (37.1 C)   Resp 15   Ht 5' 3"  (1.6 m)   SpO2 96%   BMI 29.58 kg/m  Physical Exam Vitals and nursing note reviewed.  Constitutional:      General: He is not in acute distress.    Appearance: He is well-developed. He is not ill-appearing.  HENT:     Head: Normocephalic and atraumatic.  Eyes:     Comments: Pale conjunctiva  Cardiovascular:     Rate and Rhythm: Normal rate and regular rhythm.     Pulses: Normal pulses.     Heart sounds: Normal heart sounds. No murmur heard. Pulmonary:     Effort: Pulmonary effort is  normal. No respiratory distress.     Breath sounds: Normal breath sounds.  Abdominal:     Palpations: Abdomen is soft.     Tenderness: There is no abdominal tenderness.  Musculoskeletal:        General: No swelling.     Cervical back: Neck supple.  Skin:    General: Skin is warm and dry.     Capillary Refill: Capillary refill takes less than 2 seconds.  Neurological:     Mental Status: He is alert.  Psychiatric:        Mood and Affect: Mood normal.     ED Results / Procedures / Treatments   Labs (all labs ordered are listed, but only abnormal results are displayed) Labs Reviewed  CBC WITH DIFFERENTIAL/PLATELET - Abnormal; Notable for the following components:      Result Value   RBC 2.20 (*)     Hemoglobin 5.8 (*)    HCT 19.0 (*)    RDW 17.5 (*)    Platelets 41 (*)    All other components within normal limits  COMPREHENSIVE METABOLIC PANEL - Abnormal; Notable for the following components:   Sodium 133 (*)    Potassium 3.4 (*)    Glucose, Bld 109 (*)    Calcium 8.4 (*)    Total Protein 5.8 (*)    Albumin 2.4 (*)    AST 69 (*)    Total Bilirubin 4.1 (*)    All other components within normal limits  PROTIME-INR - Abnormal; Notable for the following components:   Prothrombin Time 22.9 (*)    INR 2.0 (*)    All other components within normal limits    EKG EKG Interpretation  Date/Time:  Monday August 16 2022 16:27:28 EST Ventricular Rate:  84 PR Interval:  119 QRS Duration: 78 QT Interval:  413 QTC Calculation: 489 R Axis:   35 Text Interpretation: Ectopic atrial rhythm Borderline short PR interval Borderline T abnormalities, anterior leads Borderline prolonged QT interval Confirmed by Lennice Sites ([id number]) on 08/16/2022 4:32:26 PM  Radiology No results found.  Procedures .Critical Care  Performed by: Lennice Sites, DO Authorized by: Lennice Sites, DO   Critical care provider statement:    Critical care time (minutes):  35   Critical care was necessary to treat or prevent imminent or life-threatening deterioration of the following conditions:  Circulatory failure   Critical care was time spent personally by me on the following activities:  Discussions with primary provider, development of treatment plan with patient or surrogate, blood draw for specimens, evaluation of patient's response to treatment, examination of patient, obtaining history from patient or surrogate, ordering and performing treatments and interventions, ordering and review of laboratory studies, ordering and review of radiographic studies, pulse oximetry, re-evaluation of patient's condition and review of old charts   I assumed direction of critical care for this patient from another provider  in my specialty: no       Medications Ordered in ED   ED Course/ Medical Decision Making/ A&P                           Medical Decision Making Amount and/or Complexity of Data Reviewed Labs: ordered.  Risk Decision regarding hospitalization.   Juan [name] is here with rectal bleeding.  History of alcoholic cirrhosis.  Normal vitals.  No fever.  He appears pale.  He had about 5-7 episodes of large bright red bloody bowel movements.  Differential diagnosis  likely lower GI bleed.  Per chart review he has had EGD and colonoscopy and CT angios but no evidence of varices on images or scopes.  I got a CBC and per my review and interpretation hemoglobin 5.8.  His platelets are 41.  He has chronic thrombocytopenia.  INR is 2.0.  Lab work otherwise unremarkable.  Talked with Dr. Bryan Lemma with gastroenterology.  He does recommend a dose of Rocephin but no need for octreotide or Protonix at this time.  Patient to be admitted to medicine at [facility] long.  Talked with hospitalist but will transfer ED to ED while he awaits his bed request to get blood transfusion started.  Dr. Langston Masker accepts the patient in transfer.  Patient hemodynamically stable at this time.  This chart was dictated using voice recognition software.  Despite best efforts to proofread,  errors can occur which can change the documentation meaning.         Final Clinical Impression(s) / ED Diagnoses Final diagnoses:  Hematochezia  Lower GI bleed    Rx / DC Orders ED Discharge Orders     None         Lennice Sites, DO 08/16/22 1736

## 2022-08-17 ENCOUNTER — Inpatient Hospital Stay (HOSPITAL_COMMUNITY): Payer: No Typology Code available for payment source

## 2022-08-17 ENCOUNTER — Encounter (HOSPITAL_COMMUNITY): Payer: Self-pay | Admitting: Internal Medicine

## 2022-08-17 DIAGNOSIS — D696 Thrombocytopenia, unspecified: Secondary | ICD-10-CM | POA: Diagnosis not present

## 2022-08-17 DIAGNOSIS — K7031 Alcoholic cirrhosis of liver with ascites: Secondary | ICD-10-CM | POA: Diagnosis not present

## 2022-08-17 DIAGNOSIS — R748 Abnormal levels of other serum enzymes: Secondary | ICD-10-CM

## 2022-08-17 DIAGNOSIS — K922 Gastrointestinal hemorrhage, unspecified: Secondary | ICD-10-CM

## 2022-08-17 DIAGNOSIS — K625 Hemorrhage of anus and rectum: Secondary | ICD-10-CM

## 2022-08-17 DIAGNOSIS — K921 Melena: Secondary | ICD-10-CM | POA: Diagnosis not present

## 2022-08-17 DIAGNOSIS — K7030 Alcoholic cirrhosis of liver without ascites: Secondary | ICD-10-CM

## 2022-08-17 DIAGNOSIS — E876 Hypokalemia: Secondary | ICD-10-CM | POA: Diagnosis not present

## 2022-08-17 DIAGNOSIS — F419 Anxiety disorder, unspecified: Secondary | ICD-10-CM

## 2022-08-17 DIAGNOSIS — K766 Portal hypertension: Secondary | ICD-10-CM

## 2022-08-17 DIAGNOSIS — K7210 Chronic hepatic failure without coma: Secondary | ICD-10-CM | POA: Diagnosis not present

## 2022-08-17 DIAGNOSIS — D62 Acute posthemorrhagic anemia: Secondary | ICD-10-CM

## 2022-08-17 DIAGNOSIS — I10 Essential (primary) hypertension: Secondary | ICD-10-CM | POA: Diagnosis not present

## 2022-08-17 HISTORY — DX: Other disorders of bilirubin metabolism: E80.6

## 2022-08-17 LAB — CBC
HCT: 17.2 % — ABNORMAL LOW (ref 39.0–52.0)
HCT: 24.1 % — ABNORMAL LOW (ref 39.0–52.0)
HCT: 23.4 % — ABNORMAL LOW (ref 39.0–52.0)
HCT: 25.1 % — ABNORMAL LOW (ref 39.0–52.0)
Hemoglobin: 5.2 g/dL — CL (ref 13.0–17.0)
Hemoglobin: 7.5 g/dL — ABNORMAL LOW (ref 13.0–17.0)
Hemoglobin: 7.4 g/dL — ABNORMAL LOW (ref 13.0–17.0)
Hemoglobin: 7.8 g/dL — ABNORMAL LOW (ref 13.0–17.0)
MCH: 26.8 pg (ref 26.0–34.0)
MCH: 26.9 pg (ref 26.0–34.0)
MCH: 27.2 pg (ref 26.0–34.0)
MCH: 27.1 pg (ref 26.0–34.0)
MCHC: 30.2 g/dL (ref 30.0–36.0)
MCHC: 31.1 g/dL (ref 30.0–36.0)
MCHC: 31.6 g/dL (ref 30.0–36.0)
MCHC: 31.1 g/dL (ref 30.0–36.0)
MCV: 88.7 fL (ref 80.0–100.0)
MCV: 86.4 fL (ref 80.0–100.0)
MCV: 86 fL (ref 80.0–100.0)
MCV: 87.2 fL (ref 80.0–100.0)
Platelets: 39 10*3/uL — ABNORMAL LOW (ref 150–400)
Platelets: 42 10*3/uL — ABNORMAL LOW (ref 150–400)
Platelets: 38 10*3/uL — ABNORMAL LOW (ref 150–400)
Platelets: 41 10*3/uL — ABNORMAL LOW (ref 150–400)
RBC: 1.94 MIL/uL — ABNORMAL LOW (ref 4.22–5.81)
RBC: 2.79 MIL/uL — ABNORMAL LOW (ref 4.22–5.81)
RBC: 2.72 MIL/uL — ABNORMAL LOW (ref 4.22–5.81)
RBC: 2.88 MIL/uL — ABNORMAL LOW (ref 4.22–5.81)
RDW: 17.9 % — ABNORMAL HIGH (ref 11.5–15.5)
RDW: 17.3 % — ABNORMAL HIGH (ref 11.5–15.5)
RDW: 17.7 % — ABNORMAL HIGH (ref 11.5–15.5)
RDW: 18.1 % — ABNORMAL HIGH (ref 11.5–15.5)
WBC: 5.1 10*3/uL (ref 4.0–10.5)
WBC: 5.1 10*3/uL (ref 4.0–10.5)
WBC: 5 10*3/uL (ref 4.0–10.5)
WBC: 4.6 10*3/uL (ref 4.0–10.5)
nRBC: 0 % (ref 0.0–0.2)
nRBC: 0 % (ref 0.0–0.2)
nRBC: 0 % (ref 0.0–0.2)
nRBC: 0 % (ref 0.0–0.2)

## 2022-08-17 LAB — COMPREHENSIVE METABOLIC PANEL
ALT: 21 U/L (ref 0–44)
AST: 56 U/L — ABNORMAL HIGH (ref 15–41)
Albumin: 2.5 g/dL — ABNORMAL LOW (ref 3.5–5.0)
Alkaline Phosphatase: 55 U/L (ref 38–126)
Anion gap: 9 (ref 5–15)
BUN: 12 mg/dL (ref 6–20)
CO2: 21 mmol/L — ABNORMAL LOW (ref 22–32)
Calcium: 8.5 mg/dL — ABNORMAL LOW (ref 8.9–10.3)
Chloride: 105 mmol/L (ref 98–111)
Creatinine, Ser: 0.79 mg/dL (ref 0.61–1.24)
GFR, Estimated: >60 mL/min (ref >60)
Glucose, Bld: 89 mg/dL (ref 70–99)
Potassium: 3.6 mmol/L (ref 3.5–5.1)
Sodium: 135 mmol/L (ref 135–145)
Total Bilirubin: 4.1 mg/dL — ABNORMAL HIGH (ref 0.3–1.2)
Total Protein: 5.7 g/dL — ABNORMAL LOW (ref 6.5–8.1)

## 2022-08-17 LAB — BODY FLUID CELL COUNT WITH DIFFERENTIAL
Eos, Fluid: 0 %
Lymphs, Fluid: 8 %
Monocyte-Macrophage-Serous Fluid: 79 % (ref 50–90)
Neutrophil Count, Fluid: 13 % (ref 0–25)
Total Nucleated Cell Count, Fluid: 171 cu mm (ref 0–1000)

## 2022-08-17 LAB — GRAM STAIN

## 2022-08-17 LAB — PROTIME-INR
INR: 2.3 — ABNORMAL HIGH (ref 0.8–1.2)
Prothrombin Time: 25.3 seconds — ABNORMAL HIGH (ref 11.4–15.2)

## 2022-08-17 LAB — PROTEIN, PLEURAL OR PERITONEAL FLUID: Total protein, fluid: <3 g/dL

## 2022-08-17 LAB — PREPARE RBC (CROSSMATCH)

## 2022-08-17 MED ORDER — PANTOPRAZOLE SODIUM 40 MG PO TBEC
40.0000 mg | DELAYED_RELEASE_TABLET | Freq: Every day | ORAL | Status: DC
  Administered 2022-08-17 – 2022-08-18 (×2): 40 mg via ORAL
  Filled 2022-08-17 (×2): qty 1

## 2022-08-17 MED ORDER — SIMETHICONE 80 MG PO CHEW
80.0000 mg | CHEWABLE_TABLET | Freq: Once | ORAL | Status: AC
  Administered 2022-08-17: 80 mg via ORAL
  Filled 2022-08-17: qty 1

## 2022-08-17 MED ORDER — IOHEXOL 350 MG/ML SOLN
100.0000 mL | Freq: Once | INTRAVENOUS | Status: AC | PRN
  Administered 2022-08-17: 100 mL via INTRAVENOUS

## 2022-08-17 MED ORDER — POTASSIUM CHLORIDE CRYS ER 20 MEQ PO TBCR
20.0000 meq | EXTENDED_RELEASE_TABLET | Freq: Every day | ORAL | Status: DC
  Administered 2022-08-17: 20 meq via ORAL
  Filled 2022-08-17: qty 1

## 2022-08-17 MED ORDER — MAGNESIUM OXIDE -MG SUPPLEMENT 400 (240 MG) MG PO TABS
400.0000 mg | ORAL_TABLET | Freq: Every day | ORAL | Status: DC
  Administered 2022-08-17 (×2): 400 mg via ORAL
  Filled 2022-08-17 (×3): qty 1

## 2022-08-17 MED ORDER — POTASSIUM CHLORIDE CRYS ER 20 MEQ PO TBCR
40.0000 meq | EXTENDED_RELEASE_TABLET | Freq: Once | ORAL | Status: AC
  Administered 2022-08-17: 40 meq via ORAL
  Filled 2022-08-17: qty 2

## 2022-08-17 MED ORDER — SPIRONOLACTONE 25 MG PO TABS
50.0000 mg | ORAL_TABLET | Freq: Every day | ORAL | Status: DC
  Administered 2022-08-17 – 2022-08-18 (×2): 50 mg via ORAL
  Filled 2022-08-17 (×2): qty 2

## 2022-08-17 MED ORDER — BUSPIRONE HCL 5 MG PO TABS
15.0000 mg | ORAL_TABLET | Freq: Two times a day (BID) | ORAL | Status: DC
  Administered 2022-08-17 – 2022-08-18 (×4): 15 mg via ORAL
  Filled 2022-08-17 (×4): qty 1

## 2022-08-17 MED ORDER — FUROSEMIDE 20 MG PO TABS
20.0000 mg | ORAL_TABLET | Freq: Every day | ORAL | Status: DC
  Administered 2022-08-17 – 2022-08-18 (×2): 20 mg via ORAL
  Filled 2022-08-17 (×2): qty 1

## 2022-08-17 MED ORDER — SODIUM CHLORIDE (PF) 0.9 % IJ SOLN
INTRAMUSCULAR | Status: AC
  Filled 2022-08-17: qty 50

## 2022-08-17 MED ORDER — SACCHAROMYCES BOULARDII 250 MG PO CAPS
250.0000 mg | ORAL_CAPSULE | Freq: Two times a day (BID) | ORAL | Status: DC
  Administered 2022-08-17 – 2022-08-18 (×4): 250 mg via ORAL
  Filled 2022-08-17 (×4): qty 1

## 2022-08-17 MED ORDER — SODIUM CHLORIDE 0.9% IV SOLUTION: Freq: Once | INTRAVENOUS | Status: AC

## 2022-08-17 MED ORDER — LIDOCAINE HCL 1 % IJ SOLN
INTRAMUSCULAR | Status: AC
  Administered 2022-08-17: 15 mL
  Filled 2022-08-17: qty 20

## 2022-08-17 NOTE — Progress Notes (Signed)
Brief rounding note, same day as admission  HPI: Juan [name] is a 56 y.o. male with medical history significant of cirrhosis, portal HTN, GI bleed, chronic thrombocytopenia, PTSD, GAD, OSA who presented to outside ED 08/16/2022 with rectal bleeding that reportedly started earlier in the morning and had close to 10 episodes. Has right sided abdominal cramping pain. Felt some lightheadedness but no shortness of breath. Last GI bleed back in February due to internal hemorrhoids.  Patient admitted after midnight for acute blood loss anemia with ongoing rectal bleeding.  GI consulted.  Interval history: Pt awake resting in bed this AM.  He'd just had another episode of bloody stool left in the toilet.  Some intermittent right-sided cramping pain.    Exam: General exam: awake, alert, no acute distress HEENT: pale mucus membranes, hearing grossly normal  Respiratory system: CTAB, no wheezes, rales or rhonchi, normal respiratory effort. Cardiovascular system: normal S1/S2, RRR, no pedal edema.   Gastrointestinal system: soft, mild right-sided tenderness without rebound or guarding, active bowel wounds. Central nervous system: A&O x3. no gross focal neurologic deficits, normal speech Extremities: moves all , no edema, normal tone Skin: dry, intact, normal temperature Psychiatry: normal mood, congruent affect, judgement and insight appear normal   A&P: as per H&P by Dr. Flossie Buffy and per Gastroenterology, with any changes or additions as below:  --note GI recommendations including CTA and potential IR consult if active bleeding localized. --monitoring Hbg trend, will transfuse if < 7 --note post-transfusion Hbg is 7.5      No charge

## 2022-08-17 NOTE — Procedures (Signed)
Ultrasound-guided diagnostic and therapeutic paracentesis performed yielding 1 liter of yellow fluid. No immediate complications. The fluid was sent to the lab for preordered studies. EBL < 1 cc.

## 2022-08-17 NOTE — Assessment & Plan Note (Signed)
-  continue Buspar

## 2022-08-17 NOTE — Assessment & Plan Note (Addendum)
Acute blood loss anemia -acute rectal bleed with presenting Hgb of 5.8 with baseline usually around 8-10 -last endoscopy (10/2021) and colonoscopy (09/2021) with no findings of varies. At that time he has GI bleed though secondary to internal hemorrhoids.  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 -EDP discussed with Dr. Bryan Lemma with gastroenterology. He does recommend a dose of Rocephin but no need for octreotide or Protonix at this time. Will continue Rocephin for active GI bleed in setting of cirrhosis.  -Transfuse 2u pRBC with goal of Hgb >7.

## 2022-08-17 NOTE — Consult Note (Addendum)
Consultation  Referring Provider:   [name] Primary Care Physician:  [name], DO Primary Gastroenterologist:  Dr. [name]       Reason for Consultation:     ETOH cirrhosis with GI bleed and anemia   Impression    GI bleed in setting of thrombocytopenia and ETOH cirrhosis Bright red blood per rectum, HD stable No elevation of BUN to indicate upper GI bleed. 09/24/2021 colonoscopy and endoscopy for bleeding showed colonoscopy with internal hemorrhoids, EGD grade C esophagitis, medium size hiatal hernia, portal hypertensive gastropathy, no varices.  Repeat EGD 10/23/21 resolution of esophagitis. Admitting hemoglobin 5.8, status post 2 units PRBC currently at 5.2. Patient continues to have bright red blood per rectum has had 3 episodes this morning. Has received one dose of rocephin CTA shows no evidence of active bleeding in the small bowel or colon, portal hypertension seen, small moderate ascites seen.  With bilateral pleural effusions and mild basilar atelectasis.  Small gallstones, nodular liver.  Several hyperdense foci distal small bowel right lower quadrant on the noncontrast and contrast favored intraluminal contents and not active bleeding.  Decompensated cirrhosis secondary to ETOH WBC 5.1 HGB 5.2 Platelets 39 AST 69 ALT 25  Alkphos 57 TBili 4.1 INR 08/16/2022 2.0  MELD-Na: 23  - HCC 03/01/2022 with CTA, AFP 06/2022 -follows with [facility]  with Coagulopathy 08/16/2022 INR 2.0 Check PT/INR daily (Vitamin K/FFP if elevated for procedure)  Thrombocytopenia secondary to above Platelets 39  Ascites Has lower leg edema and possible ascites on exam Has never had previously.  No evidence of leukocytosis  Moderate AS with grade 2 diastolic dysfunction Echo 05/05/2022  Previous history of alcohol abuse Weekly sessions with counselor, [place] and [place] psychiatrist.  Has been close to a year sober but recent positive PETH test at [facility].    LOS: 1 day      Plan   56 year old male with history of alcoholic cirrhosis, thrombocytopenia, portal hypertension, hepatic encephalopathy, alcoholic myopathy presents with profuse rectal bleeding and acute on chronic anemia. Follows with [facility].   09/24/2021 internal hemorrhoids, EGD grade C esophagitis medium size hiatal hernia portal hypertensive gastropathy.  No varices. With presentation appears to be lower GI bleed, no elevation of BUN or evidence of brisk upper GI bleed. Patient continues to have profuse rectal bleeding has been transfused 2 units PRBC and hemoglobin 5.2, repeat 7.5   - With active overt bleeding, will proceed directly with the CTA, with IR consultation if positive.  -Patient has baseline thrombocytopenia currently at 39, may need platelets prior to any intervention. --Continue to monitor H&H with transfusion as needed to maintain hemoglobin greater than 7.,  With history of portal hypertension try not to get hemoglobin greater than 8. -If CTA is negative and patient continues to have bleeding will discuss with Dr. Henrene Pastor if need to pursue endoscopic evaluation. Continue Protonix once a day  Patient with significant lower extremity edema and possible ascites. -Will schedule for diagnostic and therapeutic paracentesis after CTA if it shows significant ascites, patient's never had paracentesis prior. Will need to adjust lasix and spironolactone, some mild hypotension, will start after discussion with Dr. Henrene Pastor -Continue low-sodium 2 g restricted diet -No max limit to be removed. If they take off 4-5 L please give IV 25 grams albumin x 1   -Please send for cell count with differential, albumin concentration, total protein concentration.  Continue daily INR, CBC, CMET, calculate Daily MELD.  Dr. Henrene Pastor  will see patient the patient and give further recommendations.   Thank you for your kind consultation, we will continue to follow.         HPI:   Juan [name] is a 56  y.o. male with past medical history significant for hypertension, depression/PTSD, history of DVT 2020, gallstones, questionable hemochromatosis diagnosis, GERD/esophagitis alcoholic myopathy ambulates with walker and wheelchair at home, mild to moderate aortic stenosis on echo 05/05/22, alcohol abuse with alcoholic cirrhosis with known portal hypertension, thrombocytopenia, hepatic encephalopathy, GERD, adenomatous polyp history presents with rectal bleeding and acute on chronic anemia.  Previously admitted January of this year worsening fatigue influenza B with anemia.  EGD 09/24/2021 which showed grade C esophagitis, a medium size hiatal hernia, portal hypertensive gastropathy. The colonoscopy identified nonbleeding internal hemorrhoids. Repeat EGD was done 10/23/2021 which showed resolution of his esophagitis.  Patient has not had any further alcohol in close to a year per patient however last [facility] note 06/24/2022 showed that path was positive in the office.  Will need future serial negative Peth testing for liver transplant consideration. Patient also follows with [facility] last visit 06/24/2022.  Referred to neurology for possible neurodegenerative disease in setting of future liver transplant.  Patient presented to the ER with complaints of rectal bleeding and lightheadedness.   States started to have acute bright red large-volume blood per rectum yesterday.  Had 4 episodes yesterday, 2 in the ED and has had 4 more episodes this morning of bright red blood.  Right when I came in the room he said he just had large-volume bright red blood.  Had already flushed the toilet. Patient's had some associated lower abdominal cramping.  Denies fever chills, nausea or vomiting. Has had some reflux uncertain if he is taking Protonix at home but is prescribed once daily.   Denies any dysphagia.  No hematemesis. Patient states he has had worsening swelling in his abdomen and legs for the last week,  his weight is gone up from 163-182 per patient he has been seeing a nutritionist and increasing his protein. Patient denies shortness of breath or chest pain Patient takes lactulose 30 mg as needed about once a week, has bowel movement 3 times in the morning and 2 in the afternoon normally without it.  Labs presenting to the ER had hemoglobin 5.8 with baseline of 9-10 and 07/01/2022. Platelets 41, potassium 3.4, albumin 2.4, AST 69, total bilirubin 4.1.,  INR 2.0. Status post 2 units PRBC Patient is not had any imaging this hospitalization, had CT angio 03/01/22  was negative for GI bleeding. Given a dose of Rocephin, not on octreotide or Protonix at this time.  Normal ceruloplasmin 08/2019.  Hepatitis A IgG, IgM, Hep B core IgG and IgM nonreactive 08/2019. Hep B surface antibody reactive, hep B surface antigen nonreactive 08/2019.  Alpha 1 antitrypsin 11/2019 " single M isoform Detective, in context of normal alpha 1 antitrypsin, this is consistent with MM phenotype".  Vague history of hemochromatosis and phlebotomy. Father died from alcohol associated cirrhosis.   Abdominal/pelvic CTA 03/01/2022: Abnormal ED labs: Abnormal Labs Reviewed  CBC WITH DIFFERENTIAL/PLATELET - Abnormal; Notable for the following components:      Result Value   RBC 2.20 (*)    Hemoglobin 5.8 (*)    HCT 19.0 (*)    RDW 17.5 (*)    Platelets 41 (*)    All other components within normal limits  COMPREHENSIVE METABOLIC PANEL - Abnormal; Notable for the following components:  Sodium 133 (*)    Potassium 3.4 (*)    Glucose, Bld 109 (*)    Calcium 8.4 (*)    Total Protein 5.8 (*)    Albumin 2.4 (*)    AST 69 (*)    Total Bilirubin 4.1 (*)    All other components within normal limits  PROTIME-INR - Abnormal; Notable for the following components:   Prothrombin Time 22.9 (*)    INR 2.0 (*)    All other components within normal limits  CBC - Abnormal; Notable for the following components:   RBC 1.94 (*)     Hemoglobin 5.2 (*)    HCT 17.2 (*)    RDW 17.9 (*)    Platelets 39 (*)    All other components within normal limits   PAST GI PROCEDURES:   EGD 11/20/2021: Normal esophagus. - Medium-sized hiatal hernia. - Portal hypertensive gastropathy. - Normal examined duodenum. - No specimens collected. -PPI was decreased to once daily.     EGD 09/24/21: - LA Grade C esophagitis with no bleeding. Biopsied. - Medium-sized hiatal hernia. - Portal hypertensive gastropathy. - Erythematous mucosa in the antrum. Biopsied. - Normal examined duodenum. Path: . STOMACH, BIOPSY:  -  Reactive gastropathy with mild foveolar hyperplasia.  -  Suggestive of mild vascular ectasia, without thrombi.  -  Negative for an inflammatory infiltrate predictive of Helicobacter  pylori infection.  -  Negative for intestinal metaplasia.  -  Negative for malignancy.   B. ESOPHAGUS, BIOPSY:  -  Squamocolumnar mucosa with no specific pathologic diagnosis (scant  columnar epithelium present).  -  Negative for acute esophagitis and intrasquamous eosinophils.  -  Negative for intestinal metaplasia.  -  No viral cytopathic change or fungi identified (on HE).  -  Negative for dysplasia and malignancy  Colonoscopy 09/24/21: - The examined portion of the ileum was normal. - A tattoo was seen in the sigmoid colon. - Non-bleeding internal hemorrhoids.   09/2019 colonoscopy.  Unable to locate results.  Patient said he had what sounds like an adenomatous polyp.  Has not had follow-up colonoscopy.  Does not recall mention of diverticulosis or of UC.     09/2019 EGD.  Unable to locate results.  Pt reports diagnosis of GERD   09/2019 FibroScan.  Unable to locate results.    Past Medical History:  Diagnosis Date   Alcohol addiction ([place])    Anxiety    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[place]) 09/38/1829   Chronic fatigue    Cirrhosis ([facility])    Colon polyps    Depression    GERD (gastroesophageal reflux disease)    Hemochromatosis     Hiatal hernia 02/20/2022   Hx of blood clots    Leg   Hyperreflexia    Hypertension    PTSD (post-traumatic stress disorder)    Traumatic hemorrhagic shock [facility])     Surgical History:  He  has a past surgical history that includes Hernia repair; Vasectomy; Fracture surgery; Knee surgery (Right); Shoulder surgery (Bilateral); Carpal tunnel release (Bilateral); Esophagogastroduodenoscopy (egd) with propofol (N/A, 09/24/2021); Colonoscopy with propofol (N/A, 09/24/2021); and biopsy (09/24/2021). Family History:  His family history includes Breast cancer in his paternal aunt; Diabetes in his brother; Hypertension in his father; Other in his father; Pulmonary fibrosis in his mother. Social History:   reports that he has been smoking cigarettes. His smokeless tobacco use includes snuff. He reports that he does not currently use alcohol. He reports that he does not use drugs.  Prior  to Admission medications   Medication Sig Start Date End Date Taking? Authorizing Provider  acetaminophen (TYLENOL) 650 MG CR tablet Take 650 mg by mouth daily as needed for pain.   Yes [provider]  busPIRone (BUSPAR) 15 MG tablet Take 15 mg by mouth 2 (two) times daily.   Yes [provider]  lactulose, encephalopathy, (CHRONULAC) 10 GM/15ML SOLN Take 10 g by mouth daily as needed (constipation).   Yes [provider]  Magnesium Chloride 64 MG TABS Take 2 tablets by mouth in the morning and at bedtime. Patient taking differently: Take 64 mg by mouth daily in the afternoon. 11/18/21  Yes [name], DO  ondansetron (ZOFRAN-ODT) 8 MG disintegrating tablet Dissolve 1 tablet (8 mg total) by mouth every 8 (eight) hours as needed for nausea. 04/09/22  Yes [name], DO  pantoprazole (PROTONIX) 40 MG tablet Take 1 tablet (40 mg total) by mouth daily. 07/20/22 07/20/23 Yes Sharyn Creamer, MD  potassium chloride SA (KLOR-CON M) 20 MEQ tablet Take 1 tablet (20 mEq total) by mouth daily. 06/25/22  08/24/22 Yes [name], DO  prochlorperazine (COMPAZINE) 10 MG tablet Take 1 tablet (10 mg total) by mouth every 6 (six) hours as needed for nausea or vomiting. 07/01/22  Yes Silverio Decamp, MD  saccharomyces boulardii (FLORASTOR) 250 MG capsule Take 250 mg by mouth 2 (two) times daily.    Yes [provider]  aspirin EC 81 MG tablet Take 81 mg by mouth daily. Swallow whole.    [provider]  spironolactone (ALDACTONE) 25 MG tablet Take 1 tablet (25 mg total) by mouth daily. 04/13/22   [name], DO  vortioxetine HBr (TRINTELLIX) 10 MG TABS tablet Take 1 tablet (10 mg total) by mouth daily. 09/02/21   [name], DO    Current Facility-Administered Medications  Medication Dose Route Frequency Provider Last Rate Last Admin   busPIRone (BUSPAR) tablet 15 mg  15 mg Oral BID Tu, Ching T, DO   15 mg at 08/17/22 0108   cefTRIAXone (ROCEPHIN) 2 g in sodium chloride 0.9 % 100 mL IVPB  2 g Intravenous Q24H Curatolo, Adam, DO   Stopped at 08/16/22 1852   influenza vac split quadrivalent PF (FLUARIX) injection 0.5 mL  0.5 mL Intramuscular Tomorrow-1000 Hosie Poisson, MD       magnesium oxide (MAG-OX) tablet 400 mg  400 mg Oral Q1500 Tu, Ching T, DO   400 mg at 08/17/22 0108   pantoprazole (PROTONIX) EC tablet 40 mg  40 mg Oral Daily Tu, Ching T, DO       potassium chloride SA (KLOR-CON M) CR tablet 20 mEq  20 mEq Oral Daily Tu, Ching T, DO       saccharomyces boulardii (FLORASTOR) capsule 250 mg  250 mg Oral BID Tu, Ching T, DO   250 mg at 08/17/22 0108    Allergies as of 08/16/2022 - Review Complete 08/16/2022  Allergen Reaction Noted   Apple juice Swelling 05/07/2021   Cucumber extract Itching and Nausea And Vomiting 10/16/2013   Depakote er [divalproex sodium er] Swelling 11/10/2017   Peanut butter flavor Anaphylaxis and Swelling 10/16/2013   Peanut oil Swelling 10/16/2013   Peanut-containing drug products Other (See Comments) and Swelling 10/16/2013    Shellfish allergy Itching and Swelling 10/16/2013   Shrimp extract allergy skin test Itching and Swelling 10/16/2013   Strawberry extract Nausea And Vomiting and Swelling 10/16/2013   Valproic acid Anaphylaxis 10/16/2013   Apple Swelling 05/04/2022  Cantaloupe extract allergy skin test Rash 12/30/2017   Codeine Itching and Rash 10/16/2013   Lactose Other (See Comments) 12/30/2017   Vancomycin Rash 12/29/2018    Review of Systems:    Constitutional: No weight loss, fever, chills, weakness or fatigue HEENT: Eyes: No change in vision               Ears, Nose, Throat:  No change in hearing or congestion Skin: No rash or itching Cardiovascular: No chest pain, chest pressure or palpitations   Respiratory: No SOB or cough Gastrointestinal: See HPI and otherwise negative Genitourinary: No dysuria or change in urinary frequency Neurological: No headache, dizziness or syncope Musculoskeletal: No new muscle or joint pain Hematologic: No bleeding or bruising Psychiatric: No history of depression or anxiety     Physical Exam:  Vital signs in last 24 hours: Temp:  [98.4 F (36.9 C)-99 F (37.2 C)] 99 F (37.2 C) (12/26 0734) Pulse Rate:  [81-92] 83 (12/26 0734) Resp:  [14-21] 18 (12/26 0734) BP: (93-111)/(62-74) 102/67 (12/26 0734) SpO2:  [93 %-99 %] 93 % (12/26 0734) Weight:  [81.8 kg] 81.8 kg (12/25 2357) Last BM Date : 08/17/22 Last BM recorded by nurses in past 5 days Stool Type: Type 6 (Mushy consistency with ragged edges) (08/17/2022  1:10 AM)  General :  Alert, chronically ill-appearing male in no acute distress Head:  Normocephalic and atraumatic. Eyes :  scleral icterus,conjunctive icteric  Heart:  regular rate and rhythm, significant systolic murmur right sternal border Pulm:  Clear anteriorly; no wheezing Abdomen:   Soft, Obese protuberant AB, skin exam normal, Normal bowel sounds. mild tenderness in the RLQ. Without guarding and Without rebound, without hepatomegaly. no   fluid wave,  possible  shifting dullness.  Extremities:   With 2-3+ edema. Msk:  Symmetrical without gross deformities. Peripheral pulses intact.  Neurologic: Alert and  oriented x4;  grossly normal neurologically. without asterixis or clonus.  Skin:   without jaundice. no palmar erythema or spider angioma.   Psychiatric:  Demonstrates good judgement and reason without abnormal affect or behaviors.   LAB RESULTS: Recent Labs    08/16/22 1617 08/17/22 0026  WBC 5.3 5.1  HGB 5.8* 5.2*  HCT 19.0* 17.2*  PLT 41* 39*   BMET Recent Labs    08/16/22 1617  NA 133*  K 3.4*  CL 102  CO2 23  GLUCOSE 109*  BUN 12  CREATININE 0.81  CALCIUM 8.4*   LFT Recent Labs    08/16/22 1617  PROT 5.8*  ALBUMIN 2.4*  AST 69*  ALT 25  ALKPHOS 57  BILITOT 4.1*   PT/INR Recent Labs    08/16/22 1633  LABPROT 22.9*  INR 2.0*    STUDIES: No results found.   Vladimir Crofts  08/17/2022, 8:08 AM  GI ATTENDING  History, laboratories, x-rays, prior endoscopy reports all personally reviewed.  Patient seen and examined.  Agree with comprehensive consultation note as outlined above.  This patient has advanced liver disease as outlined.  Noted to have ascites.  Edema.  Lower GI bleeding.  Significant interval anemia lower than his chronic baseline of around 8-10.  Almost certainly hemorrhoidal in nature based on description and prior endoscopic assessments.  Patient has just completed his paracentesis.  Await findings.  Will place on low-dose diuretics.  Needs to watch his sodium.  Renal function okay.  Will plan upper endoscopy and flexible sigmoidoscopy tomorrow.  Patient is high risk given his comorbidities.  If hemorrhoids are the  issue, then he may need hemorrhoidectomy or banding.  Docia Chuck. Geri Seminole., M.D. [facility] Division of Gastroenterology

## 2022-08-17 NOTE — Progress Notes (Signed)
.   Transition of Care [facility]) Screening Note   Patient Details  Name: Alecxis Baltzell Date of Birth: 05/25/1966   Transition of Care [facility]) CM/SW Contact:    Illene Regulus, LCSW Phone Number: 08/17/2022, 10:53 AM    Transition of Care Department [facility]) has reviewed patient and no TOC needs have been identified at this time. We will continue to monitor patient advancement through interdisciplinary progression rounds. If new patient transition needs arise, please place a TOC consult.

## 2022-08-17 NOTE — H&P (Signed)
History and Physical    Patient: Juan [name] WEX:[id number] DOB: [birth year] DOA: 08/16/2022 DOS: the patient was seen and examined on 08/17/2022 PCP: [name], DO  Patient coming from: [facility] ED   Chief Complaint:  Chief Complaint  Patient presents with   Rectal Bleeding   HPI: Juan [name] is a 56 y.o. male with medical history significant of cirrhosis, portal HTN, GI bleed, chronic thrombocytopenia, PTSD, GAD, OSA who presented to outside ED with rectal bleeding.   Rectal bleed started earlier this morning and had close to 10 episodes. Has right sided abdominal cramping pain. Felt some lightheadedness but no shortness of breath. Last GI bleed back in February due to internal hemorrhoids. Reports being sober for close to a year now.   In the ED, he was afebrile and normotensive and briefly required 2 L via nasal cannula. CBC was notable for hemoglobin of 5.8 with prior baseline of around 8-10.  Platelet of 41 which is chronic. INR of 2.  Sodium of 133, K of 3.4, creatinine of 0.81 with BUN of 12.  LFTs and total bilirubin was elevated but his baseline.  EDP discussed with Dr. Bryan Lemma with gastroenterology. He does recommend a dose of Rocephin but no need for octreotide or Protonix at this time. Per documentation pt was suppose to be an ED to ED transfer for blood transfusion but was ultimately just transferred to the floor here at [facility].    Review of Systems: As mentioned in the history of present illness. All other systems reviewed and are negative. Past Medical History:  Diagnosis Date   Alcohol addiction ([place])    Anxiety    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HCC) 93/81/0175   Chronic fatigue    Cirrhosis (HCC)    Colon polyps    Depression    GERD (gastroesophageal reflux disease)    Hemochromatosis    Hiatal hernia 02/20/2022   Hx of blood clots    Leg   Hyperreflexia    Hypertension    PTSD (post-traumatic stress disorder)    Traumatic hemorrhagic shock  [facility])    Past Surgical History:  Procedure Laterality Date   BIOPSY  09/24/2021   Procedure: BIOPSY;  Surgeon: Sharyn Creamer, MD;  Location: [facility] ENDOSCOPY;  Service: Gastroenterology;;   Wilmon Pali RELEASE Bilateral    COLONOSCOPY WITH PROPOFOL N/A 09/24/2021   Procedure: COLONOSCOPY WITH PROPOFOL;  Surgeon: Sharyn Creamer, MD;  Location: [place];  Service: Gastroenterology;  Laterality: N/A;   ESOPHAGOGASTRODUODENOSCOPY (EGD) WITH PROPOFOL N/A 09/24/2021   Procedure: ESOPHAGOGASTRODUODENOSCOPY (EGD) WITH PROPOFOL;  Surgeon: Sharyn Creamer, MD;  Location: [place];  Service: Gastroenterology;  Laterality: N/A;   FRACTURE SURGERY     left ankle plate   HERNIA REPAIR     inguinal   KNEE SURGERY Right    x 4   SHOULDER SURGERY Bilateral    x 2   VASECTOMY     Social History:  reports that he has been smoking cigarettes. His smokeless tobacco use includes snuff. He reports that he does not currently use alcohol. He reports that he does not use drugs.  Allergies  Allergen Reactions   Apple Juice Swelling    Swelling  of tongue   Cucumber Extract Itching and Nausea And Vomiting    No extracts; just cucumber   Depakote Er [Divalproex Sodium Er] Swelling    Tongue swelling   Peanut Butter Flavor Anaphylaxis and Swelling   Peanut Oil Swelling   Peanut-Containing Drug Products Other (  See Comments) and Swelling   Shellfish Allergy Itching and Swelling   Shrimp Extract Allergy Skin Test Itching and Swelling   Strawberry Extract Nausea And Vomiting and Swelling   Valproic Acid Anaphylaxis    depakote   Apple Swelling   Cantaloupe Extract Allergy Skin Test Rash   Codeine Itching and Rash    Patient reports he can take "CODONES" without problems   Lactose Other (See Comments)    Lactose intolerant - causes indigestion Stomach pain/gas   Vancomycin Rash    Family History  Problem Relation Age of Onset   Pulmonary fibrosis Mother    Hypertension Father    Other Father         liver failure   Diabetes Brother    Breast cancer Paternal Aunt    Colon cancer Neg Hx    Esophageal cancer Neg Hx    Stomach cancer Neg Hx    Rectal cancer Neg Hx     Prior to Admission medications   Medication Sig Start Date End Date Taking? Authorizing Provider  acetaminophen (TYLENOL) 650 MG CR tablet Take 650 mg by mouth daily as needed for pain.   Yes [provider]  Magnesium Chloride 64 MG TABS Take 2 tablets by mouth in the morning and at bedtime. Patient taking differently: Take 64 mg by mouth daily in the afternoon. 11/18/21  Yes [name], DO  ondansetron (ZOFRAN-ODT) 8 MG disintegrating tablet Dissolve 1 tablet (8 mg total) by mouth every 8 (eight) hours as needed for nausea. 04/09/22  Yes [name], DO  pantoprazole (PROTONIX) 40 MG tablet Take 1 tablet (40 mg total) by mouth daily. 07/20/22 07/20/23 Yes Sharyn Creamer, MD  potassium chloride SA (KLOR-CON M) 20 MEQ tablet Take 1 tablet (20 mEq total) by mouth daily. 06/25/22 08/24/22 Yes [name], DO  prochlorperazine (COMPAZINE) 10 MG tablet Take 1 tablet (10 mg total) by mouth every 6 (six) hours as needed for nausea or vomiting. 07/01/22  Yes Silverio Decamp, MD  saccharomyces boulardii (FLORASTOR) 250 MG capsule Take 250 mg by mouth 2 (two) times daily.    Yes [provider]  aspirin EC 81 MG tablet Take 81 mg by mouth daily. Swallow whole.    [provider]  spironolactone (ALDACTONE) 25 MG tablet Take 1 tablet (25 mg total) by mouth daily. 04/13/22   [name], DO  vortioxetine HBr (TRINTELLIX) 10 MG TABS tablet Take 1 tablet (10 mg total) by mouth daily. 09/02/21   [name], DO    Physical Exam: Vitals:   08/16/22 2000 08/16/22 2101 08/16/22 2339 08/16/22 2357  BP: 106/63 101/66 108/70   Pulse: 92 92 85   Resp: 17 (!) 21 20   Temp:   98.6 F (37 C)   TempSrc:   Oral   SpO2: 98% 97% 97%   Weight:    81.8 kg  Height:    5' 3"  (1.6 m)   Constitutional:  NAD, calm, comfortable, middle-age male laying in bed with generalized pallor Eyes: lids and conjunctivae normal ENMT: Mucous membranes are moist.  Neck: normal, supple Respiratory: clear to auscultation bilaterally, no wheezing, no crackles. Normal respiratory effort. No accessory muscle use.  Cardiovascular: Regular rate and rhythm, no murmurs / rubs / gallops.  +3 pitting edema of bilateral lower extremity up to knee.   Abdomen: Soft, nontender nondistended.  No guarding, rebound tenderness or rigidity.  Bowel sounds positive.  Musculoskeletal: no clubbing / cyanosis. No joint deformity upper  and lower extremities. Good ROM, no contractures. Normal muscle tone.  Skin: no rashes, lesions, ulcers.  Neurologic: CN 2-12 grossly intact.  Strength 5/5 in all 4.  Psychiatric: Normal judgment and insight. Alert and oriented x 3. Normal mood. Data Reviewed:  See HPI  Assessment and Plan: * GI bleed Acute blood loss anemia -acute rectal bleed with presenting Hgb of 5.8 with baseline usually around 8-10 -last endoscopy (10/2021) and colonoscopy (09/2021) with no findings of varies. At that time he has GI bleed though secondary to internal hemorrhoids.  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 -EDP discussed with Dr. Bryan Lemma with gastroenterology. He does recommend a dose of Rocephin but no need for octreotide or Protonix at this time. Will continue Rocephin for active GI bleed in setting of cirrhosis.  -Transfuse 2u pRBC with goal of Hgb >7.   Hypokalemia -K of 3.4. Will replete with oral K and continue home potassium.   Thrombocytopenia (HCC) -Plt of 41. This is chronic and stable.  Anxiety -continue Buspar  Alcoholic cirrhosis of liver without ascites ([place]) -pt reports being sober for close to a year  -will continue Lactulose after GI bleed resolves  Elevated liver enzymes Hyperbilirubinemia -stable at baseline      Advance Care  Planning: Full  Consults: GI consult  Family Communication: none at bedside  Severity of Illness: The appropriate patient status for this patient is INPATIENT. Inpatient status is judged to be reasonable and necessary in order to provide the required intensity of service to ensure the patient's safety. The patient's presenting symptoms, physical exam findings, and initial radiographic and laboratory data in the context of their chronic comorbidities is felt to place them at high risk for further clinical deterioration. Furthermore, it is not anticipated that the patient will be medically stable for discharge from the hospital within 2 midnights of admission.   * I certify that at the point of admission it is my clinical judgment that the patient will require inpatient hospital care spanning beyond 2 midnights from the point of admission due to high intensity of service, high risk for further deterioration and high frequency of surveillance required.*  Author: Orene Desanctis, DO 08/17/2022 12:43 AM  For on call review [email or web address].

## 2022-08-17 NOTE — Assessment & Plan Note (Signed)
-  pt reports being sober for close to a year  -will continue Lactulose after GI bleed resolves

## 2022-08-17 NOTE — Assessment & Plan Note (Addendum)
-  K of 3.4. Will replete with oral K and continue home potassium.

## 2022-08-17 NOTE — Assessment & Plan Note (Signed)
-  Plt of 41. This is chronic and stable.

## 2022-08-17 NOTE — Assessment & Plan Note (Signed)
Hyperbilirubinemia -stable at baseline

## 2022-08-17 NOTE — Plan of Care (Signed)
  Problem: Clinical Measurements: Goal: Ability to maintain clinical measurements within normal limits will improve Outcome: Progressing Goal: Diagnostic test results will improve Outcome: Progressing   Problem: Coping: Goal: Level of anxiety will decrease Outcome: Progressing   Problem: Safety: Goal: Ability to remain free from injury will improve Outcome: Progressing

## 2022-08-18 ENCOUNTER — Inpatient Hospital Stay (HOSPITAL_COMMUNITY): Payer: No Typology Code available for payment source | Admitting: Anesthesiology

## 2022-08-18 ENCOUNTER — Encounter (HOSPITAL_COMMUNITY)
Admission: EM | Disposition: A | Discharge status: Home / Self Care | Payer: Self-pay | Source: Home / Self Care | Attending: Internal Medicine

## 2022-08-18 ENCOUNTER — Encounter (HOSPITAL_COMMUNITY): Payer: Self-pay | Admitting: Internal Medicine

## 2022-08-18 DIAGNOSIS — K921 Melena: Secondary | ICD-10-CM

## 2022-08-18 DIAGNOSIS — K209 Esophagitis, unspecified without bleeding: Secondary | ICD-10-CM

## 2022-08-18 DIAGNOSIS — K625 Hemorrhage of anus and rectum: Secondary | ICD-10-CM | POA: Diagnosis not present

## 2022-08-18 DIAGNOSIS — K766 Portal hypertension: Secondary | ICD-10-CM | POA: Diagnosis not present

## 2022-08-18 DIAGNOSIS — F17210 Nicotine dependence, cigarettes, uncomplicated: Secondary | ICD-10-CM

## 2022-08-18 DIAGNOSIS — K648 Other hemorrhoids: Secondary | ICD-10-CM

## 2022-08-18 DIAGNOSIS — I10 Essential (primary) hypertension: Secondary | ICD-10-CM

## 2022-08-18 HISTORY — PX: ESOPHAGOGASTRODUODENOSCOPY (EGD) WITH PROPOFOL: SHX5813

## 2022-08-18 HISTORY — PX: FLEXIBLE SIGMOIDOSCOPY: SHX5431

## 2022-08-18 HISTORY — DX: Other hemorrhoids: K64.8

## 2022-08-18 LAB — BPAM RBC
Blood Product Expiration Date: 202401152359
Blood Product Expiration Date: 202401152359
ISSUE DATE / TIME: 202312260215
ISSUE DATE / TIME: 202312260509
Unit Type and Rh: 7300
Unit Type and Rh: 7300

## 2022-08-18 LAB — TYPE AND SCREEN
ABO/RH(D): B POS
Antibody Screen: NEGATIVE
Unit division: 0
Unit division: 0

## 2022-08-18 LAB — BASIC METABOLIC PANEL
Anion gap: 6 (ref 5–15)
BUN: 9 mg/dL (ref 6–20)
CO2: 23 mmol/L (ref 22–32)
Calcium: 8.2 mg/dL — ABNORMAL LOW (ref 8.9–10.3)
Chloride: 108 mmol/L (ref 98–111)
Creatinine, Ser: 0.84 mg/dL (ref 0.61–1.24)
GFR, Estimated: >60 mL/min (ref >60)
Glucose, Bld: 90 mg/dL (ref 70–99)
Potassium: 3.3 mmol/L — ABNORMAL LOW (ref 3.5–5.1)
Sodium: 137 mmol/L (ref 135–145)

## 2022-08-18 LAB — CBC
HCT: 23.8 % — ABNORMAL LOW (ref 39.0–52.0)
Hemoglobin: 7.5 g/dL — ABNORMAL LOW (ref 13.0–17.0)
MCH: 27.3 pg (ref 26.0–34.0)
MCHC: 31.5 g/dL (ref 30.0–36.0)
MCV: 86.5 fL (ref 80.0–100.0)
Platelets: 43 10*3/uL — ABNORMAL LOW (ref 150–400)
RBC: 2.75 MIL/uL — ABNORMAL LOW (ref 4.22–5.81)
RDW: 18 % — ABNORMAL HIGH (ref 11.5–15.5)
WBC: 4.6 10*3/uL (ref 4.0–10.5)
nRBC: 0 % (ref 0.0–0.2)

## 2022-08-18 LAB — PHOSPHORUS: Phosphorus: 3.3 mg/dL (ref 2.5–4.6)

## 2022-08-18 LAB — MAGNESIUM: Magnesium: 1.2 mg/dL — ABNORMAL LOW (ref 1.7–2.4)

## 2022-08-18 LAB — PROTIME-INR
INR: 2.3 — ABNORMAL HIGH (ref 0.8–1.2)
Prothrombin Time: 24.7 seconds — ABNORMAL HIGH (ref 11.4–15.2)

## 2022-08-18 SURGERY — ESOPHAGOGASTRODUODENOSCOPY (EGD) WITH PROPOFOL
Anesthesia: Monitor Anesthesia Care

## 2022-08-18 MED ORDER — MAGNESIUM SULFATE 2 GM/50ML IV SOLN
2.0000 g | Freq: Once | INTRAVENOUS | Status: AC
  Administered 2022-08-18: 2 g via INTRAVENOUS
  Filled 2022-08-18: qty 50

## 2022-08-18 MED ORDER — PROPOFOL 10 MG/ML IV BOLUS
INTRAVENOUS | Status: DC | PRN
  Administered 2022-08-18: 30 mg via INTRAVENOUS
  Administered 2022-08-18: 40 mg via INTRAVENOUS

## 2022-08-18 MED ORDER — PHENYLEPHRINE 80 MCG/ML (10ML) SYRINGE FOR IV PUSH (FOR BLOOD PRESSURE SUPPORT)
PREFILLED_SYRINGE | INTRAVENOUS | Status: DC | PRN
  Administered 2022-08-18 (×2): 160 ug via INTRAVENOUS

## 2022-08-18 MED ORDER — LIDOCAINE 2% (20 MG/ML) 5 ML SYRINGE
INTRAMUSCULAR | Status: DC | PRN
  Administered 2022-08-18: 60 mg via INTRAVENOUS

## 2022-08-18 MED ORDER — LACTATED RINGERS IV SOLN: INTRAVENOUS | Status: DC

## 2022-08-18 MED ORDER — PROPOFOL 10 MG/ML IV BOLUS
INTRAVENOUS | Status: AC
  Filled 2022-08-18: qty 20

## 2022-08-18 MED ORDER — HYDROCORTISONE ACETATE 25 MG RE SUPP
25.0000 mg | Freq: Two times a day (BID) | RECTAL | Status: DC
  Filled 2022-08-18: qty 1

## 2022-08-18 MED ORDER — SODIUM CHLORIDE 0.9 % IV SOLN: INTRAVENOUS | Status: DC

## 2022-08-18 MED ORDER — HYDROCORTISONE ACETATE 25 MG RE SUPP: 25.0000 mg | Freq: Two times a day (BID) | RECTAL | 0 refills | Status: DC

## 2022-08-18 MED ORDER — POTASSIUM CHLORIDE CRYS ER 20 MEQ PO TBCR
40.0000 meq | EXTENDED_RELEASE_TABLET | Freq: Every day | ORAL | Status: DC
  Administered 2022-08-18: 40 meq via ORAL
  Filled 2022-08-18: qty 2

## 2022-08-18 MED ORDER — PROPOFOL 500 MG/50ML IV EMUL
INTRAVENOUS | Status: DC | PRN
  Administered 2022-08-18: 150 ug/kg/min via INTRAVENOUS

## 2022-08-18 SURGICAL SUPPLY — 15 items

## 2022-08-18 NOTE — Plan of Care (Signed)
  Problem: Education: Goal: Knowledge of General Education information will improve Description: Including pain rating scale, medication(s)/side effects and non-pharmacologic comfort measures Outcome: Progressing   Problem: Clinical Measurements: Goal: Ability to maintain clinical measurements within normal limits will improve Outcome: Progressing   Problem: Safety: Goal: Ability to remain free from injury will improve Outcome: Progressing

## 2022-08-18 NOTE — Progress Notes (Addendum)
Progress Note  Primary GI: Dr. [name]   Subjective  Chief Complaint:ETOH cirrhosis with GI bleed and anemia   Patient lying in room, no acute distress.  Patient states just had bowel movement this morning with bright red blood in it. Patient denies fever, chills. Continues have some mild lower abdominal discomfort.    Objective   Vital signs in last 24 hours: Temp:  [98 F (36.7 C)-98.9 F (37.2 C)] 98 F (36.7 C) (12/27 1307) Pulse Rate:  [66-90] 66 (12/27 1307) Resp:  [16-22] 22 (12/27 1307) BP: (96-128)/(55-82) 96/55 (12/27 1307) SpO2:  [92 %-97 %] 96 % (12/27 1307) Weight:  [81.8 kg] 81.8 kg (12/27 0448) Last BM Date : 08/17/22 Last BM recorded by nurses in past 5 days Stool Type: Type 7 (Liquid consistency with no solid pieces) (08/18/2022 12:00 PM)  General:   male in no acute distress  Heart:  Regular rate and rhythm; no murmurs Pulm: Clear anteriorly; no wheezing Abdomen:  Soft, Obese AB, Active bowel sounds. mild tenderness in the RLQ. Without guarding and Without rebound, No organomegaly appreciated. Extremities:  with  edema. Neurologic:  Alert and  oriented x4;  No focal deficits.  No asterixis. Psych:  Cooperative. Normal mood and affect.  Intake/Output from previous day: 12/26 0701 - 12/27 0700 In: 792 [P.O.:360; Blood:332; IV Piggyback:100] Out: -  Intake/Output this shift: Total I/O In: -  Out: 350 [Urine:350]  Studies/Results: US Paracentesis  Result Date: 08/17/2022 INDICATION: Patient with history of alcoholic cirrhosis, GI bleed, anemia, coagulopathy/thrombocytopenia, diastolic dysfunction, ascites. Request received for diagnostic and therapeutic paracentesis. EXAM: ULTRASOUND GUIDED DIAGNOSTIC AND THERAPEUTIC PARACENTESIS MEDICATIONS: 10 mL 1% lidocaine COMPLICATIONS: None immediate. PROCEDURE: Informed written consent was obtained from the patient after a discussion of the risks, benefits and alternatives to treatment. A timeout was  performed prior to the initiation of the procedure. Initial ultrasound scanning demonstrates a small amount of ascites within the right lower abdominal quadrant. The right lower abdomen was prepped and draped in the usual sterile fashion. 1% lidocaine was used for local anesthesia. Following this, a 19 gauge, 10-cm, Yueh catheter was introduced. An ultrasound image was saved for documentation purposes. The paracentesis was performed. The catheter was removed and a dressing was applied. The patient tolerated the procedure well without immediate post procedural complication. FINDINGS: A total of approximately 1 liter of yellow fluid was removed. Samples were sent to the laboratory as requested by the clinical team. IMPRESSION: Successful ultrasound-guided diagnostic and therapeutic paracentesis yielding 1 liter of peritoneal fluid. PLAN: If the patient eventually requires >/=2 paracenteses in a 30 day period, candidacy for formal evaluation by the [facility] will be assessed. Read by: Rowe Robert, PA-C Electronically Signed   By: Markus Daft M.D.   On: 08/17/2022 16:44   CT ANGIO GI BLEED  Result Date: 08/17/2022 CLINICAL DATA:  Lower gastrointestinal bleeding. Plate red blood per rectum. EXAM: CTA ABDOMEN AND PELVIS WITHOUT AND WITH CONTRAST TECHNIQUE: Multidetector CT imaging of the abdomen and pelvis was performed using the standard protocol during bolus administration of intravenous contrast. Multiplanar reconstructed images and MIPs were obtained and reviewed to evaluate the vascular anatomy. RADIATION DOSE REDUCTION: This exam was performed according to the departmental dose-optimization program which includes automated exposure control, adjustment of the mA and/or kV according to patient size and/or use of iterative reconstruction technique. CONTRAST:  163m OMNIPAQUE IOHEXOL 350 MG/ML SOLN COMPARISON:  CTA 01/30/2022 FINDINGS: VASCULAR Aorta: Normal  caliber aorta  without aneurysm, dissection, vasculitis or significant stenosis. Celiac: Patent without evidence of aneurysm, dissection, vasculitis or significant stenosis. SMA: Patent without evidence of aneurysm, dissection, vasculitis or significant stenosis. Renals: Both renal arteries are patent without evidence of aneurysm, dissection, vasculitis, fibromuscular dysplasia or significant stenosis. IMA: Patent without evidence of aneurysm, dissection, vasculitis or significant stenosis. Inflow: Patent without evidence of aneurysm, dissection, vasculitis or significant stenosis. Proximal Outflow: Bilateral common femoral and visualized portions of the superficial and profunda femoral arteries are patent without evidence of aneurysm, dissection, vasculitis or significant stenosis. Veins: Venous collaterals in the upper abdomen about the spleen. Recanalization of the umbilical vein. Review of the MIP images confirms the above findings. NON-VASCULAR Lower chest: Bilateral pleural effusions and mild basilar atelectasis. Hepatobiliary: Small gallstones collect in the lumen the gallbladder. No inflammation. Enlarged caudate lobe. Nodular contour of the liver. Portal veins are patent. Moderate volume ascites in the upper abdomen. Pancreas: No inflammation Spleen: Normal small volume spleen. Venous collaterals as described above Adrenals/Urinary Tract: Kidneys enhance symmetrically. Ureters and bladder normal. Stomach/Bowel: No evidence of active gastrointestinal bleeding within the colon. Several hyperdense foci within the distal small bowel in the RIGHT lower quadrant are present on the noncontrast and contrast series and favored intraluminal contents and not active bleeding. No change on the portal venous phase imaging to suggest pooling blood. No evidence of bowel obstruction.  Stomach and duodenum are normal. Lymphatic: Mild periaortic lymphadenopathy not changed. Reproductive: Unremarkable Other: Small volume  ascites. Musculoskeletal: No aggressive osseous lesion. IMPRESSION: VASCULAR 1. No evidence of active bleeding into the small bowel or colon. 2. 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 NON-VASCULAR 1. Sequela of ascites and portal hypertension. 2. Small moderate ascites. Electronically Signed   By: Suzy Bouchard M.D.   On: 08/17/2022 10:09    Lab Results: Recent Labs    08/17/22 1305 08/17/22 2005 08/18/22 0524  WBC 5.0 4.6 4.6  HGB 7.4* 7.8* 7.5*  HCT 23.4* 25.1* 23.8*  PLT 38* 41* 43*   BMET Recent Labs    08/16/22 1617 08/17/22 1002 08/18/22 0524  NA 133* 135 137  K 3.4* 3.6 3.3*  CL 102 105 108  CO2 23 21* 23  GLUCOSE 109* 89 90  BUN 12 12 9   CREATININE 0.81 0.79 0.84  CALCIUM 8.4* 8.5* 8.2*   LFT Recent Labs    08/17/22 1002  PROT 5.7*  ALBUMIN 2.5*  AST 56*  ALT 21  ALKPHOS 55  BILITOT 4.1*   PT/INR Recent Labs    08/17/22 1047 08/18/22 0524  LABPROT 25.3* 24.7*  INR 2.3* 2.3*     Scheduled Meds:  busPIRone  15 mg Oral BID   furosemide  20 mg Oral Daily   influenza vac split quadrivalent PF  0.5 mL Intramuscular Tomorrow-1000   magnesium oxide  400 mg Oral Q1500   pantoprazole  40 mg Oral Daily   potassium chloride SA  40 mEq Oral Daily   saccharomyces boulardii  250 mg Oral BID   spironolactone  50 mg Oral Daily   Continuous Infusions:  cefTRIAXone (ROCEPHIN)  IV 2 g (08/17/22 1708)      Patient profile:   56 year old male with history of alcohol cirrhosis, thrombocytopenia, coagulopathy, history of ascites, portal hypertensive gastropathy, esophagitis presents with hematochezia. Negative CTA.   Impression/Plan:   GI bleed in setting of thrombocytopenia and ETOH cirrhosis Today: HGB 7.5 MCV 86.5  09/24/2021 colonoscopy and endoscopy for bleeding showed colonoscopy with  internal hemorrhoids, EGD grade C esophagitis, medium size hiatal hernia, portal hypertensive  gastropathy, no varices.  Repeat EGD 10/23/21 resolution of esophagitis. Admitting hemoglobin 5.8, status post 2 units PRBC currently at 7.5 Patient continues to have bright red blood per rectum this morning. Negative CTA Most likely this represents internal hemorrhoidal bleeding Plan for flex sigmoidoscopy and endoscopy today, patient has had enema in preparation and is NPO. I discussed risks of EGD and Flex sig with patient today, including risk of sedation, bleeding or perforation.  Patient provides understanding and gave consent.    Decompensated cirrhosis secondary to ETOH AST 56 ALT 21  Alkphos 55 TBili 4.1 MELD 3.0: 22 at 08/18/2022  5:24 AM - [facility] 03/01/2022 with CTA, AFP 06/2022 -follows with [facility]   with Coagulopathy INR 08/18/2022 2.3  Check PT/INR daily   Thrombocytopenia secondary to above Platelets 43  Ascites Status post paracentesis 08/18/2022 Gram stain negative, Neutrophil count at 13 %  and total nucleated cell count at 171 indicating no SBP.  Total protein was less than 3.0.  Started on Lasix 20 mg and spironolactone 50 mg yesterday, can consider increasing tomorrow to 40/100. Kidney function stable today, mild hypokalemia 3.3.  Moderate AS with grade 2 diastolic dysfunction Echo 05/05/2022   Previous history of alcohol abuse Weekly sessions with counselor, [facility] [place] psychiatrist.   Has been close to a year sober per patient but recent positive PETH test at [facility].  Principal Problem:   GI bleed Active Problems:   Elevated liver enzymes   Benign essential hypertension   Thrombocytopenia (HCC)   Hypokalemia   Alcoholic cirrhosis of liver without ascites (HCC)   Hyperbilirubinemia   Acute blood loss anemia   Anxiety    LOS: 2 days   Vladimir Crofts  08/18/2022, 1:30 PM  GI ATTENDING  Interval history data reviewed.  Patient seen and examined.  Agree with above progress note.  He is for upper endoscopy to rule out significant  active pathology as well as flexible sigmoidoscopy to assess what sounds like significant recurrent bleeding hemorrhoids.  If hemorrhoidal bleeding identified as a constant problem, he will need intervention such as surgical hemorrhoidectomy or in office banding if amenable.  Docia Chuck. Geri Seminole., M.D. [facility] Division of Gastroenterology

## 2022-08-18 NOTE — Transfer of Care (Signed)
Immediate Anesthesia Transfer of Care Note  Patient: Juan [name]  Procedure(s) Performed: ESOPHAGOGASTRODUODENOSCOPY (EGD) WITH PROPOFOL FLEXIBLE SIGMOIDOSCOPY  Patient Location: Endoscopy Unit  Anesthesia Type:MAC  Level of Consciousness: oriented, drowsy, and patient cooperative  Airway & Oxygen Therapy: Patient Spontanous Breathing and Patient connected to face mask oxygen  Post-op Assessment: Report given to RN and Post -op Vital signs reviewed and stable  Post vital signs: Reviewed  Last Vitals:  Vitals Value Taken Time  BP 97/65 08/18/22 1526  Temp 36.7 C 08/18/22 1525  Pulse 73 08/18/22 1528  Resp 16 08/18/22 1528  SpO2 100 % 08/18/22 1528  Vitals shown include unvalidated device data.  Last Pain:  Vitals:   08/18/22 1525  TempSrc: Temporal  PainSc: 0-No pain         Complications: No notable events documented.

## 2022-08-18 NOTE — Anesthesia Preprocedure Evaluation (Addendum)
Anesthesia Evaluation  Patient identified by MRN, date of birth, ID band Patient awake    Reviewed: Allergy & Precautions, H&P , NPO status , Patient's Chart, lab work & pertinent test results  Airway Mallampati: II  TM Distance: >3 FB Neck ROM: Full    Dental no notable dental hx. (+) Dental Advisory Given   Pulmonary Current Smoker   Pulmonary exam normal breath sounds clear to auscultation       Cardiovascular hypertension, Normal cardiovascular exam Rhythm:Regular Rate:Normal     Neuro/Psych   Anxiety     negative neurological ROS     GI/Hepatic ,GERD  ,,(+) Cirrhosis   ascites  substance abuse  alcohol useChronic alcoholic  Portal HTN   Endo/Other  negative endocrine ROS    Renal/GU negative Renal ROS  negative genitourinary   Musculoskeletal negative musculoskeletal ROS (+)    Abdominal   Peds negative pediatric ROS (+)  Hematology  (+) Blood dyscrasia, anemia Thrombocytopenia  INR 2.3 Patient not on coumadin. Probably has 5% or less of normal liver function   Anesthesia Other Findings   Reproductive/Obstetrics negative OB ROS                              Anesthesia Physical Anesthesia Plan  ASA: 4  Anesthesia Plan: MAC   Post-op Pain Management: Minimal or no pain anticipated   Induction: Intravenous  PONV Risk Score and Plan: 1 and Propofol infusion and Treatment may vary due to age or medical condition  Airway Management Planned: Simple Face Mask  Additional Equipment:   Intra-op Plan:   Post-operative Plan:   Informed Consent: I have reviewed the patients History and Physical, chart, labs and discussed the procedure including the risks, benefits and alternatives for the proposed anesthesia with the patient or authorized representative who has indicated his/her understanding and acceptance.     Dental advisory given  Plan Discussed with: CRNA and  Surgeon  Anesthesia Plan Comments:         Anesthesia Quick Evaluation

## 2022-08-18 NOTE — Op Note (Signed)
[facility] Patient Name: Juan [name] Procedure Date: 08/18/2022 MRN: [id number] Attending MD: Docia Chuck. Henrene Pastor , MD, [id number] Date of Birth: 30-Jul-1966 CSN: [id number] Age: 56 Admit Type: Inpatient Procedure:                Upper GI endoscopy Indications:              Hematochezia/rectal bleeding. History of liver                            disease. Acute blood loss anemia Providers:                Docia Chuck. Henrene Pastor, MD, Fanny Skates RN, RN, Doristine Johns, RN, Benetta Spar, Technician Referring MD:             [facility] Medicines:                Monitored Anesthesia Care Complications:            No immediate complications. Estimated Blood Loss:     Estimated blood loss: none. Procedure:                Pre-Anesthesia Assessment:                           - Prior to the procedure, a History and Physical                            was performed, and patient medications and                            allergies were reviewed. The patient's tolerance of                            previous anesthesia was also reviewed. The risks                            and benefits of the procedure and the sedation                            options and risks were discussed with the patient.                            All questions were answered, and informed consent                            was obtained. Prior Anticoagulants: The patient has                            taken no anticoagulant or antiplatelet agents. ASA                            Grade Assessment: III - A patient with severe  systemic disease. After reviewing the risks and                            benefits, the patient was deemed in satisfactory                            condition to undergo the procedure.                           After obtaining informed consent, the endoscope was                            passed under direct vision. Throughout  the                            procedure, the patient's blood pressure, pulse, and                            oxygen saturations were monitored continuously. The                            GIF-H190 ([id number] Olympus endoscope was introduced                            through the mouth, and advanced to the second part                            of duodenum. The upper GI endoscopy was                            accomplished without difficulty. The patient                            tolerated the procedure well. Scope In: Scope Out: Findings:      The esophagus revealed mild distal esophagitis as manifested by edema.       No erosions. No appreciable varices.      The stomach revealed moderate diffuse portal hypertensive gastropathy       without bleeding or stigmata. Small hiatal hernia. No proximal gastric       varices.      The examined duodenum was normal.      The cardia and gastric fundus were normal on retroflexion, save hiatal       hernia. Impression:               1. Mild esophagitis                           2. No varices                           3. Mild to moderate portal hypertensive gastropathy                           4. Otherwise unremarkable exam. Moderate Sedation:      none Recommendation:           -  Patient has a contact number available for                            emergencies. The signs and symptoms of potential                            delayed complications were discussed with the                            patient. Return to normal activities tomorrow.                            Written discharge instructions were provided to the                            patient.                           - Resume previous diet.                           - Continue present medications.                           -Proceed to flexible sigmoidoscopy. See that report Procedure Code(s):        --- Professional ---                           [id number], Esophagogastroduodenoscopy,  flexible,                            transoral; diagnostic, including collection of                            specimen(s) by brushing or washing, when performed                            (separate procedure) Diagnosis Code(s):        --- Professional ---                           K92.1, Melena (includes Hematochezia) CPT copyright 2022 American Medical Association. All rights reserved. The codes documented in this report are preliminary and upon coder review may  be revised to Juan current compliance requirements. Docia Chuck. Henrene Pastor, MD 08/18/2022 3:11:40 PM This report has been signed electronically. Number of Addenda: 0

## 2022-08-18 NOTE — Anesthesia Procedure Notes (Signed)
Procedure Name: MAC Date/Time: 08/18/2022 2:50 PM  Performed by: Jenne Campus, CRNAPre-anesthesia Checklist: Patient identified, Emergency Drugs available, Suction available and Patient being monitored Oxygen Delivery Method: Simple face mask Placement Confirmation: positive ETCO2

## 2022-08-18 NOTE — Progress Notes (Signed)
Soap Sud enema completed x1, Three BM type 5 and 7 consistency. No noticeable blood. Pt tolerated well.

## 2022-08-18 NOTE — Op Note (Signed)
[facility] Patient Name: Juan [name] Procedure Date: 08/18/2022 MRN: [id number] Attending MD: Docia Chuck. Henrene Pastor , MD, [id number] Date of Birth: 1966/03/05 CSN: [id number] Age: 56 Admit Type: Inpatient Procedure:                Flexible Sigmoidoscopy Indications:              Anal hemorrhage Providers:                Docia Chuck. Henrene Pastor, MD, Fanny Skates RN, RN, Doristine Johns, RN, Benetta Spar, Technician Referring MD:             [facility]ospital[facility] Medicines:                Monitored Anesthesia Care Complications:            No immediate complications. Estimated Blood Loss:     Estimated blood loss: none. Procedure:                Pre-Anesthesia Assessment:                           - Prior to the procedure, a History and Physical                            was performed, and patient medications and                            allergies were reviewed. The patient's tolerance of                            previous anesthesia was also reviewed. The risks                            and benefits of the procedure and the sedation                            options and risks were discussed with the patient.                            All questions were answered, and informed consent                            was obtained. Prior Anticoagulants: The patient has                            taken no anticoagulant or antiplatelet agents. ASA                            Grade Assessment: III - A patient with severe                            systemic disease. After reviewing the risks and  benefits, the patient was deemed in satisfactory                            condition to undergo the procedure.                           - Prior to the procedure, a History and Physical                            was performed, and patient medications and                            allergies were reviewed. The patient's tolerance of                             previous anesthesia was also reviewed. The risks                            and benefits of the procedure and the sedation                            options and risks were discussed with the patient.                            All questions were answered, and informed consent                            was obtained. Prior Anticoagulants: The patient has                            taken no anticoagulant or antiplatelet agents. ASA                            Grade Assessment: III - A patient with severe                            systemic disease. After reviewing the risks and                            benefits, the patient was deemed in satisfactory                            condition to undergo the procedure.                           After obtaining informed consent, the scope was                            passed under direct vision. The GIF-H190 ([id number])                            Olympus endoscope was introduced through the anus  and advanced to the the sigmoid colon. The flexible                            sigmoidoscopy was accomplished without difficulty.                            The patient tolerated the procedure well. The                            quality of the bowel preparation was excellent. Scope In: Scope Out: Findings:      The perianal and digital rectal examinations were normal.      Moderate internal hemorrhoids were noted. An area of recent bleeding was       identified. No active bleeding at this time. See images.      The exam was otherwise without abnormality to the distal sigmoid colon. Impression:               - Recently bleeding internal hemorrhoids, as                            described.                           - The examination was otherwise normal.                           - No specimens collected. Moderate Sedation:      none Recommendation:           1. Recommend daily fiber supplementation such as                             Citrucel 2 tablespoons daily. This will improve                            bowel consistency be less irritating to hemorrhoids                           2. Anusol HC suppositories. Take 1 at night                           3. Would recommend in office hemorrhoidal banding                            procedure. I will share this with your                            gastroenterologist Dr. [name], who will arrange.                           4. Regular diet                           5. Okay for discharge from GI standpoint. Procedure Code(s):        --- Professional ---  37048, Sigmoidoscopy, flexible; diagnostic,                            including collection of specimen(s) by brushing or                            washing, when performed (separate procedure) Diagnosis Code(s):        --- Professional ---                           K64.8, Other hemorrhoids                           K62.5, Hemorrhage of anus and rectum CPT copyright 2022 American Medical Association. All rights reserved. The codes documented in this report are preliminary and upon coder review may  be revised to meet current compliance requirements. Docia Chuck. Henrene Pastor, MD 08/18/2022 3:27:35 PM This report has been signed electronically. Number of Addenda: 0

## 2022-08-18 NOTE — Discharge Summary (Signed)
Physician Discharge Summary  Juan [name] OFB:[id number] DOB: [birth year] DOA: 08/16/2022  PCP: [name], DO  Admit date: 08/16/2022 Discharge date: 08/18/2022  Time spent: 34 minutes  Recommendations for Outpatient Follow-up:  Needs bmet/cbc 1 week Stopped ASa--do not resume until seen by GI  Discharge Diagnoses:  MAIN problem for hospitalization   Acute hemorrhoid bleed Acute blood loss anemia cirrhosis  Please see below for itemized issues addressed in HOpsital- refer to other progress notes for clarity if needed  Discharge Condition: Heart healthy  Diet recommendation: None  Filed Weights   08/16/22 2357 08/18/22 0448 08/18/22 1415  Weight: 81.8 kg 81.8 kg 81.6 kg    History of present illness:  56 year old white male Chronic ethanol use with chronic thrombocytopenia, portal hypertension (per RUQ [place] 08/2021)-EGD 10/2021 no varices Known alcoholic cirrhosis prior admission for vasovagal syncope Cannabis habituation ?  Prior DVT ( Hemochromatosis GI bleed thought to be secondary to hemorrhoids 02/2022 with no procedures performed Prior to [place] 07/2020  Admitted 08/17/2022 with 10 episodes of bleeding noted that he is on aspirin with some cramping hemoglobin found to be 5-transfused 2 units Scope performed with sigmoidoscopy showed internal hemorrhoids with sequelae of bleeding Hemoglobin stabilized in the 7.5 range with no further overt bleeding and effluent cleared Patient had some thrombocytopenia consistent with patient's known cirrhosis patient was not    Discharge Exam: Vitals:   08/18/22 1550 08/18/22 1606  BP: 105/86 107/70  Pulse:  68  Resp: 16 18  Temp:  97.7 F (36.5 C)  SpO2: 96% 97%    Subj on day of d/c   Awake coherent pleasant no distress nontremulous No icterus no pallor Chest is clear and Abdomen is soft Perirectal exam deferred No lower extremity edema at this time other than on left side  Discharge  Instructions   Discharge Instructions     Diet - low sodium heart healthy   Complete by: As directed    Discharge instructions   Complete by: As directed    You were diagnosed with bleeding internal hemorrhoids and this was seen on the scope-you should follow-up with Dr. [name] of gastroenterology in about 2 weeks and discuss planning and whether he would need any further procedures done on this area  do not resume your  aspirin at this time instead follow-up with Dr. [name] your gastroenterologist and ask her when to resume  we have not made any large changes to meds otherwise-it is felt that you are stable for discharge   Increase activity slowly   Complete by: As directed       Allergies as of 08/18/2022       Reactions   Apple Juice Swelling   Swelling  of tongue   Cucumber Extract Itching, Nausea And Vomiting   No extracts; just cucumber   Depakote Er [divalproex Sodium Er] Swelling   Tongue swelling   Peanut Butter Flavor Anaphylaxis, Swelling   Peanut Oil Swelling   Peanut-containing Drug Products Other (See Comments), Swelling   Shellfish Allergy Itching, Swelling   Shrimp Extract Allergy Skin Test Itching, Swelling   Strawberry Extract Nausea And Vomiting, Swelling   Valproic Acid Anaphylaxis   depakote   Apple Swelling   Cantaloupe Extract Allergy Skin Test Rash   Codeine Itching, Rash   Patient reports he can take "CODONES" without problems   Lactose Other (See Comments)   Lactose intolerant - causes indigestion Stomach pain/gas   Vancomycin Rash        Medication List  STOP taking these medications    acetaminophen 650 MG CR tablet Commonly known as: TYLENOL   aspirin EC 81 MG tablet       TAKE these medications    busPIRone 15 MG tablet Commonly known as: BUSPAR Take 15 mg by mouth 2 (two) times daily.   lactulose (encephalopathy) 10 GM/15ML Soln Commonly known as: CHRONULAC Take 10 g by mouth daily as needed (constipation).    Magnesium Chloride 64 MG Tabs Take 2 tablets by mouth in the morning and at bedtime. What changed:  how much to take when to take this   ondansetron 8 MG disintegrating tablet Commonly known as: ZOFRAN-ODT Dissolve 1 tablet (8 mg total) by mouth every 8 (eight) hours as needed for nausea.   pantoprazole 40 MG tablet Commonly known as: PROTONIX Take 1 tablet (40 mg total) by mouth daily.   potassium chloride SA 20 MEQ tablet Commonly known as: KLOR-CON M Take 1 tablet (20 mEq total) by mouth daily.   prochlorperazine 10 MG tablet Commonly known as: COMPAZINE Take 1 tablet (10 mg total) by mouth every 6 (six) hours as needed for nausea or vomiting.   saccharomyces boulardii 250 MG capsule Commonly known as: FLORASTOR Take 250 mg by mouth 2 (two) times daily.   spironolactone 25 MG tablet Commonly known as: ALDACTONE Take 1 tablet (25 mg total) by mouth daily.   Trintellix 10 MG Tabs tablet Generic drug: vortioxetine HBr Take 1 tablet (10 mg total) by mouth daily.       Allergies  Allergen Reactions   Apple Juice Swelling    Swelling  of tongue   Cucumber Extract Itching and Nausea And Vomiting    No extracts; just cucumber   Depakote Er [Divalproex Sodium Er] Swelling    Tongue swelling   Peanut Butter Flavor Anaphylaxis and Swelling   Peanut Oil Swelling   Peanut-Containing Drug Products Other (See Comments) and Swelling   Shellfish Allergy Itching and Swelling   Shrimp Extract Allergy Skin Test Itching and Swelling   Strawberry Extract Nausea And Vomiting and Swelling   Valproic Acid Anaphylaxis    depakote   Apple Swelling   Cantaloupe Extract Allergy Skin Test Rash   Codeine Itching and Rash    Patient reports he can take "CODONES" without problems   Lactose Other (See Comments)    Lactose intolerant - causes indigestion Stomach pain/gas   Vancomycin Rash      The results of significant diagnostics from this hospitalization (including imaging,  microbiology, ancillary and laboratory) are listed below for reference.    Significant Diagnostic Studies: US Paracentesis  Result Date: 08/17/2022 INDICATION: Patient with history of alcoholic cirrhosis, GI bleed, anemia, coagulopathy/thrombocytopenia, diastolic dysfunction, ascites. Request received for diagnostic and therapeutic paracentesis. EXAM: ULTRASOUND GUIDED DIAGNOSTIC AND THERAPEUTIC PARACENTESIS MEDICATIONS: 10 mL 1% lidocaine COMPLICATIONS: None immediate. PROCEDURE: Informed written consent was obtained from the patient after a discussion of the risks, benefits and alternatives to treatment. A timeout was performed prior to the initiation of the procedure. Initial ultrasound scanning demonstrates a small amount of ascites within the right lower abdominal quadrant. The right lower abdomen was prepped and draped in the usual sterile fashion. 1% lidocaine was used for local anesthesia. Following this, a 19 gauge, 10-cm, Yueh catheter was introduced. An ultrasound image was saved for documentation purposes. The paracentesis was performed. The catheter was removed and a dressing was applied. The patient tolerated the procedure well without immediate post procedural complication. FINDINGS: A total of  approximately 1 liter of yellow fluid was removed. Samples were sent to the laboratory as requested by the clinical team. IMPRESSION: Successful ultrasound-guided diagnostic and therapeutic paracentesis yielding 1 liter of peritoneal fluid. PLAN: If the patient eventually requires >/=2 paracenteses in a 30 day period, candidacy for formal evaluation by the [facility] will be assessed. Read by: Rowe Robert, PA-C Electronically Signed   By: Markus Daft M.D.   On: 08/17/2022 16:44   CT ANGIO GI BLEED  Result Date: 08/17/2022 CLINICAL DATA:  Lower gastrointestinal bleeding. Plate red blood per rectum. EXAM: CTA ABDOMEN AND PELVIS WITHOUT AND WITH  CONTRAST TECHNIQUE: Multidetector CT imaging of the abdomen and pelvis was performed using the standard protocol during bolus administration of intravenous contrast. Multiplanar reconstructed images and MIPs were obtained and reviewed to evaluate the vascular anatomy. RADIATION DOSE REDUCTION: This exam was performed according to the departmental dose-optimization program which includes automated exposure control, adjustment of the mA and/or kV according to patient size and/or use of iterative reconstruction technique. CONTRAST:  148m OMNIPAQUE IOHEXOL 350 MG/ML SOLN COMPARISON:  CTA 01/30/2022 FINDINGS: VASCULAR Aorta: Normal caliber aorta without aneurysm, dissection, vasculitis or significant stenosis. Celiac: Patent without evidence of aneurysm, dissection, vasculitis or significant stenosis. SMA: Patent without evidence of aneurysm, dissection, vasculitis or significant stenosis. Renals: Both renal arteries are patent without evidence of aneurysm, dissection, vasculitis, fibromuscular dysplasia or significant stenosis. IMA: Patent without evidence of aneurysm, dissection, vasculitis or significant stenosis. Inflow: Patent without evidence of aneurysm, dissection, vasculitis or significant stenosis. Proximal Outflow: Bilateral common femoral and visualized portions of the superficial and profunda femoral arteries are patent without evidence of aneurysm, dissection, vasculitis or significant stenosis. Veins: Venous collaterals in the upper abdomen about the spleen. Recanalization of the umbilical vein. Review of the MIP images confirms the above findings. NON-VASCULAR Lower chest: Bilateral pleural effusions and mild basilar atelectasis. Hepatobiliary: Small gallstones collect in the lumen the gallbladder. No inflammation. Enlarged caudate lobe. Nodular contour of the liver. Portal veins are patent. Moderate volume ascites in the upper abdomen. Pancreas: No inflammation Spleen: Normal small volume spleen. Venous  collaterals as described above Adrenals/Urinary Tract: Kidneys enhance symmetrically. Ureters and bladder normal. Stomach/Bowel: No evidence of active gastrointestinal bleeding within the colon. Several hyperdense foci within the distal small bowel in the RIGHT lower quadrant are present on the noncontrast and contrast series and favored intraluminal contents and not active bleeding. No change on the portal venous phase imaging to suggest pooling blood. No evidence of bowel obstruction.  Stomach and duodenum are normal. Lymphatic: Mild periaortic lymphadenopathy not changed. Reproductive: Unremarkable Other: Small volume ascites. Musculoskeletal: No aggressive osseous lesion. IMPRESSION: VASCULAR 1. No evidence of active bleeding into the small bowel or colon. 2. 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 NON-VASCULAR 1. Sequela of ascites and portal hypertension. 2. Small moderate ascites. Electronically Signed   By: SSuzy BouchardM.D.   On: 08/17/2022 10:09    Microbiology: Recent Results (from the past 240 hour(s))  Gram stain     Status: None   Collection Time: 08/17/22  4:12 PM   Specimen: Peritoneal Washings  Result Value Ref Range Status   Specimen Description PERITONEAL  Final   Special Requests NONE  Final   Gram Stain   Final    WBC PRESENT, PREDOMINANTLY MONONUCLEAR NO ORGANISMS SEEN CYTOSPIN SMEAR Performed at [facility] [address], [place] [address]   Report  Status 08/17/2022 FINAL  Final     Labs: Basic Metabolic Panel: Recent Labs  Lab 08/16/22 1617 08/17/22 1002 08/18/22 0524  NA 133* 135 137  K 3.4* 3.6 3.3*  CL 102 105 108  CO2 23 21* 23  GLUCOSE 109* 89 90  BUN 12 12 9   CREATININE 0.81 0.79 0.84  CALCIUM 8.4* 8.5* 8.2*  MG  --   --  1.2*  PHOS  --   --  3.3   Liver Function Tests: Recent Labs  Lab 08/16/22 1617 08/17/22 1002  AST 69* 56*  ALT 25 21  ALKPHOS  57 55  BILITOT 4.1* 4.1*  PROT 5.8* 5.7*  ALBUMIN 2.4* 2.5*   No results for input(s): "LIPASE", "AMYLASE" in the last 168 hours. No results for input(s): "AMMONIA" in the last 168 hours. CBC: Recent Labs  Lab 08/16/22 1617 08/17/22 0026 08/17/22 1002 08/17/22 1305 08/17/22 2005 08/18/22 0524  WBC 5.3 5.1 5.1 5.0 4.6 4.6  NEUTROABS 3.0  --   --   --   --   --   HGB 5.8* 5.2* 7.5* 7.4* 7.8* 7.5*  HCT 19.0* 17.2* 24.1* 23.4* 25.1* 23.8*  MCV 86.4 88.7 86.4 86.0 87.2 86.5  PLT 41* 39* 42* 38* 41* 43*   Cardiac Enzymes: No results for input(s): "CKTOTAL", "CKMB", "CKMBINDEX", "TROPONINI" in the last 168 hours. BNP: BNP (last 3 results) Recent Labs    01/10/22 1735  BNP 85.4    ProBNP (last 3 results) No results for input(s): "PROBNP" in the last 8760 hours.  CBG: No results for input(s): "GLUCAP" in the last 168 hours.     Signed:  Nita Sells MD   [facility] 08/18/2022, 4:23 PM

## 2022-08-18 NOTE — Anesthesia Postprocedure Evaluation (Signed)
Anesthesia Post Note  Patient: Juan [name]  Procedure([name]) Performed: ESOPHAGOGASTRODUODENOSCOPY (EGD) WITH PROPOFOL FLEXIBLE SIGMOIDOSCOPY     Patient location during evaluation: PACU Anesthesia Type: MAC Level of consciousness: awake and alert Pain management: pain level controlled Vital Signs Assessment: post-procedure vital signs reviewed and stable Respiratory status: spontaneous breathing, nonlabored ventilation, respiratory function stable and patient connected to nasal cannula oxygen Cardiovascular status: stable and blood pressure returned to baseline Postop Assessment: no apparent nausea or vomiting Anesthetic complications: no  No notable events documented.  Last Vitals:  Vitals:   08/18/22 1530 08/18/22 1540  BP: 102/71 100/65  Pulse: 74 66  Resp: 17 18  Temp:    SpO2: 98% 94%    Last Pain:  Vitals:   08/18/22 1540  TempSrc:   PainSc: 0-No pain                 Juan [name]

## 2022-08-18 NOTE — Discharge Summary (Incomplete Revision)
Physician Discharge Summary  Juan [name] PYK:[id number] DOB: 1966-06-08 DOA: 08/16/2022  PCP: [name], DO  Admit date: 08/16/2022 Discharge date: 08/18/2022  Time spent: 34 minutes  Recommendations for Outpatient Follow-up:  Needs bmet/cbc 1 week Stopped ASa--do not resume until seen by GI  Discharge Diagnoses:  MAIN problem for hospitalization   Acute hemorrhoid bleed Acute blood loss anemia cirrhosis  Please see below for itemized issues addressed in HOpsital- refer to other progress notes for clarity if needed  Discharge Condition: Heart healthy  Diet recommendation: None  Filed Weights   08/16/22 2357 08/18/22 0448 08/18/22 1415  Weight: 81.8 kg 81.8 kg 81.6 kg    History of present illness:  56 year old white male Chronic ethanol use with chronic thrombocytopenia, portal hypertension (per RUQ [place] 08/2021)-EGD 10/2021 no varices Known alcoholic cirrhosis prior admission for vasovagal syncope Cannabis habituation ?  Prior DVT ( Hemochromatosis GI bleed thought to be secondary to hemorrhoids 02/2022 with no procedures performed Prior to [place] 07/2020  Admitted 08/17/2022 with 10 episodes of bleeding noted that he is on aspirin with some cramping hemoglobin found to be 5-transfused 2 units Scope performed with sigmoidoscopy showed internal hemorrhoids with sequelae of bleeding Hemoglobin stabilized in the 7.5 range with no further overt bleeding and effluent cleared Patient had some thrombocytopenia consistent with patient's known cirrhosis patient was not    Discharge Exam: Vitals:   08/18/22 1550 08/18/22 1606  BP: 105/86 107/70  Pulse:  68  Resp: 16 18  Temp:  97.7 F (36.5 C)  SpO2: 96% 97%    Subj on day of d/c   Awake coherent pleasant no distress nontremulous No icterus no pallor Chest is clear and Abdomen is soft Perirectal exam deferred No lower extremity edema at this time other than on left side  Discharge  Instructions   Discharge Instructions     Diet - low sodium heart healthy   Complete by: As directed    Discharge instructions   Complete by: As directed    You were diagnosed with bleeding internal hemorrhoids and this was seen on the scope-you should follow-up with Dr. [name] of gastroenterology in about 2 weeks and discuss planning and whether he would need any further procedures done on this area  do not resume your  aspirin at this time instead follow-up with Dr. [name] your gastroenterologist and ask her when to resume  we have not made any large changes to meds otherwise-it is felt that you are stable for discharge   Increase activity slowly   Complete by: As directed       Allergies as of 08/18/2022       Reactions   Apple Juice Swelling   Swelling  of tongue   Cucumber Extract Itching, Nausea And Vomiting   No extracts; just cucumber   Depakote Er [divalproex Sodium Er] Swelling   Tongue swelling   Peanut Butter Flavor Anaphylaxis, Swelling   Peanut Oil Swelling   Peanut-containing Drug Products Other (See Comments), Swelling   Shellfish Allergy Itching, Swelling   Shrimp Extract Allergy Skin Test Itching, Swelling   Strawberry Extract Nausea And Vomiting, Swelling   Valproic Acid Anaphylaxis   depakote   Apple Swelling   Cantaloupe Extract Allergy Skin Test Rash   Codeine Itching, Rash   Patient reports he can take "CODONES" without problems   Lactose Other (See Comments)   Lactose intolerant - causes indigestion Stomach pain/gas   Vancomycin Rash        Medication List  STOP taking these medications    acetaminophen 650 MG CR tablet Commonly known as: TYLENOL   aspirin EC 81 MG tablet       TAKE these medications    busPIRone 15 MG tablet Commonly known as: BUSPAR Take 15 mg by mouth 2 (two) times daily.   hydrocortisone 25 MG suppository Commonly known as: ANUSOL-HC Place 1 suppository (25 mg total) rectally 2 (two) times daily.    lactulose (encephalopathy) 10 GM/15ML Soln Commonly known as: CHRONULAC Take 10 g by mouth daily as needed (constipation).   Magnesium Chloride 64 MG Tabs Take 2 tablets by mouth in the morning and at bedtime. What changed:  how much to take when to take this   ondansetron 8 MG disintegrating tablet Commonly known as: ZOFRAN-ODT Dissolve 1 tablet (8 mg total) by mouth every 8 (eight) hours as needed for nausea.   pantoprazole 40 MG tablet Commonly known as: PROTONIX Take 1 tablet (40 mg total) by mouth daily.   potassium chloride SA 20 MEQ tablet Commonly known as: KLOR-CON M Take 1 tablet (20 mEq total) by mouth daily.   prochlorperazine 10 MG tablet Commonly known as: COMPAZINE Take 1 tablet (10 mg total) by mouth every 6 (six) hours as needed for nausea or vomiting.   saccharomyces boulardii 250 MG capsule Commonly known as: FLORASTOR Take 250 mg by mouth 2 (two) times daily.   spironolactone 25 MG tablet Commonly known as: ALDACTONE Take 1 tablet (25 mg total) by mouth daily.   Trintellix 10 MG Tabs tablet Generic drug: vortioxetine HBr Take 1 tablet (10 mg total) by mouth daily.       Allergies  Allergen Reactions   Apple Juice Swelling    Swelling  of tongue   Cucumber Extract Itching and Nausea And Vomiting    No extracts; just cucumber   Depakote Er [Divalproex Sodium Er] Swelling    Tongue swelling   Peanut Butter Flavor Anaphylaxis and Swelling   Peanut Oil Swelling   Peanut-Containing Drug Products Other (See Comments) and Swelling   Shellfish Allergy Itching and Swelling   Shrimp Extract Allergy Skin Test Itching and Swelling   Strawberry Extract Nausea And Vomiting and Swelling   Valproic Acid Anaphylaxis    depakote   Apple Swelling   Cantaloupe Extract Allergy Skin Test Rash   Codeine Itching and Rash    Patient reports he can take "CODONES" without problems   Lactose Other (See Comments)    Lactose intolerant - causes  indigestion Stomach pain/gas   Vancomycin Rash      The results of significant diagnostics from this hospitalization (including imaging, microbiology, ancillary and laboratory) are listed below for reference.    Significant Diagnostic Studies: US Paracentesis  Result Date: 08/17/2022 INDICATION: Patient with history of alcoholic cirrhosis, GI bleed, anemia, coagulopathy/thrombocytopenia, diastolic dysfunction, ascites. Request received for diagnostic and therapeutic paracentesis. EXAM: ULTRASOUND GUIDED DIAGNOSTIC AND THERAPEUTIC PARACENTESIS MEDICATIONS: 10 mL 1% lidocaine COMPLICATIONS: None immediate. PROCEDURE: Informed written consent was obtained from the patient after a discussion of the risks, benefits and alternatives to treatment. A timeout was performed prior to the initiation of the procedure. Initial ultrasound scanning demonstrates a small amount of ascites within the right lower abdominal quadrant. The right lower abdomen was prepped and draped in the usual sterile fashion. 1% lidocaine was used for local anesthesia. Following this, a 19 gauge, 10-cm, Yueh catheter was introduced. An ultrasound image was saved for documentation purposes. The paracentesis was performed. The catheter was  removed and a dressing was applied. The patient tolerated the procedure well without immediate post procedural complication. FINDINGS: A total of approximately 1 liter of yellow fluid was removed. Samples were sent to the laboratory as requested by the clinical team. IMPRESSION: Successful ultrasound-guided diagnostic and therapeutic paracentesis yielding 1 liter of peritoneal fluid. PLAN: If the patient eventually requires >/=2 paracenteses in a 30 day period, candidacy for formal evaluation by the [facility] will be assessed. Read by: Rowe Robert, PA-C Electronically Signed   By: Markus Daft M.D.   On: 08/17/2022 16:44   CT ANGIO GI BLEED  Result  Date: 08/17/2022 CLINICAL DATA:  Lower gastrointestinal bleeding. Plate red blood per rectum. EXAM: CTA ABDOMEN AND PELVIS WITHOUT AND WITH CONTRAST TECHNIQUE: Multidetector CT imaging of the abdomen and pelvis was performed using the standard protocol during bolus administration of intravenous contrast. Multiplanar reconstructed images and MIPs were obtained and reviewed to evaluate the vascular anatomy. RADIATION DOSE REDUCTION: This exam was performed according to the departmental dose-optimization program which includes automated exposure control, adjustment of the mA and/or kV according to patient size and/or use of iterative reconstruction technique. CONTRAST:  158m OMNIPAQUE IOHEXOL 350 MG/ML SOLN COMPARISON:  CTA 01/30/2022 FINDINGS: VASCULAR Aorta: Normal caliber aorta without aneurysm, dissection, vasculitis or significant stenosis. Celiac: Patent without evidence of aneurysm, dissection, vasculitis or significant stenosis. SMA: Patent without evidence of aneurysm, dissection, vasculitis or significant stenosis. Renals: Both renal arteries are patent without evidence of aneurysm, dissection, vasculitis, fibromuscular dysplasia or significant stenosis. IMA: Patent without evidence of aneurysm, dissection, vasculitis or significant stenosis. Inflow: Patent without evidence of aneurysm, dissection, vasculitis or significant stenosis. Proximal Outflow: Bilateral common femoral and visualized portions of the superficial and profunda femoral arteries are patent without evidence of aneurysm, dissection, vasculitis or significant stenosis. Veins: Venous collaterals in the upper abdomen about the spleen. Recanalization of the umbilical vein. Review of the MIP images confirms the above findings. NON-VASCULAR Lower chest: Bilateral pleural effusions and mild basilar atelectasis. Hepatobiliary: Small gallstones collect in the lumen the gallbladder. No inflammation. Enlarged caudate lobe. Nodular contour of the  liver. Portal veins are patent. Moderate volume ascites in the upper abdomen. Pancreas: No inflammation Spleen: Normal small volume spleen. Venous collaterals as described above Adrenals/Urinary Tract: Kidneys enhance symmetrically. Ureters and bladder normal. Stomach/Bowel: No evidence of active gastrointestinal bleeding within the colon. Several hyperdense foci within the distal small bowel in the RIGHT lower quadrant are present on the noncontrast and contrast series and favored intraluminal contents and not active bleeding. No change on the portal venous phase imaging to suggest pooling blood. No evidence of bowel obstruction.  Stomach and duodenum are normal. Lymphatic: Mild periaortic lymphadenopathy not changed. Reproductive: Unremarkable Other: Small volume ascites. Musculoskeletal: No aggressive osseous lesion. IMPRESSION: VASCULAR 1. No evidence of active bleeding into the small bowel or colon. 2. 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 NON-VASCULAR 1. Sequela of ascites and portal hypertension. 2. Small moderate ascites. Electronically Signed   By: SSuzy BouchardM.D.   On: 08/17/2022 10:09    Microbiology: Recent Results (from the past 240 hour(s))  Gram stain     Status: None   Collection Time: 08/17/22  4:12 PM   Specimen: Peritoneal Washings  Result Value Ref Range Status   Specimen Description PERITONEAL  Final   Special Requests NONE  Final   Gram Stain   Final    WBC PRESENT, PREDOMINANTLY MONONUCLEAR NO  ORGANISMS SEEN CYTOSPIN SMEAR Performed at [facility], [place] [address] [place], [place] [address]    Report Status 08/17/2022 FINAL  Final     Labs: Basic Metabolic Panel: Recent Labs  Lab 08/16/22 1617 08/17/22 1002 08/18/22 0524  NA 133* 135 137  K 3.4* 3.6 3.3*  CL 102 105 108  CO2 23 21* 23  GLUCOSE 109* 89 90  BUN 12 12 9   CREATININE 0.81 0.79 0.84  CALCIUM 8.4* 8.5* 8.2*  MG   --   --  1.2*  PHOS  --   --  3.3   Liver Function Tests: Recent Labs  Lab 08/16/22 1617 08/17/22 1002  AST 69* 56*  ALT 25 21  ALKPHOS 57 55  BILITOT 4.1* 4.1*  PROT 5.8* 5.7*  ALBUMIN 2.4* 2.5*   No results for input(s): "LIPASE", "AMYLASE" in the last 168 hours. No results for input(s): "AMMONIA" in the last 168 hours. CBC: Recent Labs  Lab 08/16/22 1617 08/17/22 0026 08/17/22 1002 08/17/22 1305 08/17/22 2005 08/18/22 0524  WBC 5.3 5.1 5.1 5.0 4.6 4.6  NEUTROABS 3.0  --   --   --   --   --   HGB 5.8* 5.2* 7.5* 7.4* 7.8* 7.5*  HCT 19.0* 17.2* 24.1* 23.4* 25.1* 23.8*  MCV 86.4 88.7 86.4 86.0 87.2 86.5  PLT 41* 39* 42* 38* 41* 43*   Cardiac Enzymes: No results for input(s): "CKTOTAL", "CKMB", "CKMBINDEX", "TROPONINI" in the last 168 hours. BNP: BNP (last 3 results) Recent Labs    01/10/22 1735  BNP 85.4    ProBNP (last 3 results) No results for input(s): "PROBNP" in the last 8760 hours.  CBG: No results for input(s): "GLUCAP" in the last 168 hours.     Signed:  Nita Sells MD   [facility] 08/18/2022, 4:23 PM

## 2022-08-18 NOTE — Plan of Care (Signed)

## 2022-08-19 ENCOUNTER — Telehealth: Payer: Self-pay

## 2022-08-19 LAB — PATHOLOGIST SMEAR REVIEW

## 2022-08-19 NOTE — Telephone Encounter (Signed)
-----   Message from Sharyn Creamer, MD sent at 08/18/2022  3:40 PM EST ----- Regarding: RE: Needs outpatient hemorrhoidal banding arranged Sounds great, thanks John! I do hemorrhoidal banding.  Beth, let's get him set up for a hemorrhoidal banding session with me at my next available clinic visit.  ----- Message ----- From: Irene Shipper, MD Sent: 08/18/2022   3:37 PM EST To: Vladimir Crofts, PA-C; Sharyn Creamer, MD Subject: Needs outpatient hemorrhoidal banding arrang#  Lyndee Leo,  This is a patient of yours with hepatic cirrhosis.  Admitted with rectal bleeding and interval anemia.  Has had workup in the past with the only significant revalation being moderate hemorrhoids.  He has been stable after transfusion.  He has rectal bleeding with bowel movements.  Upper endoscopy today revealed mild to moderate portal hypertensive gastropathy.  Nothing significant.  No varices.  Flexible sigmoidoscopy revealed no active bleeding.  He did have moderate internal hemorrhoids, 1 of which had stigmata and was friable.  He is ready to go home.  I am going to recommend fiber, Anusol suppositories, and outpatient hemorrhoidal banding procedure.  I believe he would be a good candidate.  I did not know if you perform hemorrhoidal banding, or not.  I do not.  If you do not, please arrange with one of our colleagues that does perform in office hemorrhoidal banding.  Thanks,  John  PS: I reviewed the chart on the biliary transfer from [place].  Agree with antibiotics.  Interesting that his alkaline phosphatase is normal.  He is on for ERCP tomorrow at 1 PM.  Thanks

## 2022-08-19 NOTE — Telephone Encounter (Signed)
Called the patient to assist with scheduling an appointment. No answer. Left a voicemail with my name and the reason for my call. Requested a return call to schedule his appointment.

## 2022-08-19 NOTE — Telephone Encounter (Signed)
Patient calls back and is scheduled for 09/07/22.

## 2022-08-20 ENCOUNTER — Encounter (HOSPITAL_COMMUNITY): Payer: Self-pay | Admitting: Internal Medicine

## 2022-08-20 LAB — TOTAL BILIRUBIN, BODY FLUID: Total bilirubin, fluid: 0.4 mg/dL

## 2022-08-22 LAB — CULTURE, BODY FLUID W GRAM STAIN -BOTTLE: Culture: NO GROWTH

## 2022-08-27 ENCOUNTER — Ambulatory Visit: Payer: Non-veteran care | Admitting: Family Medicine

## 2022-08-31 ENCOUNTER — Encounter: Payer: Self-pay | Admitting: Family Medicine

## 2022-08-31 ENCOUNTER — Other Ambulatory Visit (HOSPITAL_BASED_OUTPATIENT_CLINIC_OR_DEPARTMENT_OTHER): Payer: Self-pay

## 2022-08-31 ENCOUNTER — Ambulatory Visit (INDEPENDENT_AMBULATORY_CARE_PROVIDER_SITE_OTHER): Payer: Commercial Managed Care - PPO | Admitting: Family Medicine

## 2022-08-31 VITALS — BP 135/72 | HR 85 | Temp 98.6°F | Ht 63.0 in | Wt 180.0 lb

## 2022-08-31 DIAGNOSIS — R509 Fever, unspecified: Secondary | ICD-10-CM | POA: Diagnosis not present

## 2022-08-31 DIAGNOSIS — D696 Thrombocytopenia, unspecified: Secondary | ICD-10-CM

## 2022-08-31 DIAGNOSIS — J111 Influenza due to unidentified influenza virus with other respiratory manifestations: Secondary | ICD-10-CM | POA: Diagnosis not present

## 2022-08-31 DIAGNOSIS — K648 Other hemorrhoids: Secondary | ICD-10-CM

## 2022-08-31 DIAGNOSIS — K922 Gastrointestinal hemorrhage, unspecified: Secondary | ICD-10-CM

## 2022-08-31 LAB — POC COVID19 BINAXNOW: SARS Coronavirus 2 Ag: NEGATIVE

## 2022-08-31 LAB — POCT INFLUENZA A/B
Influenza A, POC: NEGATIVE
Influenza B, POC: POSITIVE — AB

## 2022-08-31 MED ORDER — OSELTAMIVIR PHOSPHATE 75 MG PO CAPS
75.0000 mg | ORAL_CAPSULE | Freq: Two times a day (BID) | ORAL | 0 refills | Status: AC
  Filled 2022-08-31: qty 10, 5d supply, fill #0

## 2022-08-31 NOTE — Progress Notes (Signed)
Juan [name] - 57 y.o. male MRN [id number]  Date of birth: 1965-08-27  Subjective Chief Complaint  Patient presents with   Chills   Fever    HPI Juan [name] is a 57 year old male here today with complaint of increased fatigue, fever, mild congestion.  Symptoms started about 2 and half to 3 days ago.  He was recently admitted to the hospital for lower GI bleed.  Sigmoidoscopy completed showing internal hemorrhoids with bleeding.  He had been well until recently.  He did note nosebleed last night.  He has had some diarrhea recently but denies any dark stools or blood in his stool.  So far he has tried DayQuil for symptom control.  This has helped some.  ROS:  A comprehensive ROS was completed and negative except as noted per HPI  Allergies  Allergen Reactions   Apple Juice Swelling    Swelling  of tongue   Cucumber Extract Itching and Nausea And Vomiting    No extracts; just cucumber   Depakote Er [Divalproex Sodium Er] Swelling    Tongue swelling   Peanut Butter Flavor Anaphylaxis and Swelling   Peanut Oil Swelling   Peanut-Containing Drug Products Other (See Comments) and Swelling   Shellfish Allergy Itching and Swelling   Shrimp Extract Allergy Skin Test Itching and Swelling   Strawberry Extract Nausea And Vomiting and Swelling   Valproic Acid Anaphylaxis    depakote   Apple Swelling   Cantaloupe Extract Allergy Skin Test Rash   Codeine Itching and Rash    Patient reports he can take "CODONES" without problems   Lactose Other (See Comments)    Lactose intolerant - causes indigestion Stomach pain/gas   Vancomycin Rash    Past Medical History:  Diagnosis Date   Alcohol addiction (HCC)    Anxiety    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   Chronic fatigue    Cirrhosis (HCC)    Colon polyps    Depression    GERD (gastroesophageal reflux disease)    Hemochromatosis    Hiatal hernia 02/20/2022   Hx of blood clots    Leg   Hyperreflexia    Hypertension    PTSD  (post-traumatic stress disorder)    Traumatic hemorrhagic shock [facility])     Past Surgical History:  Procedure Laterality Date   BIOPSY  09/24/2021   Procedure: BIOPSY;  Surgeon: Imogene Burn, MD;  Location: [facility] ENDOSCOPY;  Service: Gastroenterology;;   Fidela Salisbury RELEASE Bilateral    COLONOSCOPY WITH PROPOFOL N/A 09/24/2021   Procedure: COLONOSCOPY WITH PROPOFOL;  Surgeon: Imogene Burn, MD;  Location: [facility] ENDOSCOPY;  Service: Gastroenterology;  Laterality: N/A;   ESOPHAGOGASTRODUODENOSCOPY (EGD) WITH PROPOFOL N/A 09/24/2021   Procedure: ESOPHAGOGASTRODUODENOSCOPY (EGD) WITH PROPOFOL;  Surgeon: Imogene Burn, MD;  Location: [facility] ENDOSCOPY;  Service: Gastroenterology;  Laterality: N/A;   ESOPHAGOGASTRODUODENOSCOPY (EGD) WITH PROPOFOL N/A 08/18/2022   Procedure: ESOPHAGOGASTRODUODENOSCOPY (EGD) WITH PROPOFOL;  Surgeon: Hilarie Fredrickson, MD;  Location: [facility] ENDOSCOPY;  Service: Gastroenterology;  Laterality: N/A;   FLEXIBLE SIGMOIDOSCOPY N/A 08/18/2022   Procedure: FLEXIBLE SIGMOIDOSCOPY;  Surgeon: Hilarie Fredrickson, MD;  Location: [facility] ENDOSCOPY;  Service: Gastroenterology;  Laterality: N/A;   FRACTURE SURGERY     left ankle plate   HERNIA REPAIR     inguinal   KNEE SURGERY Right    x 4   SHOULDER SURGERY Bilateral    x 2   VASECTOMY      Social History   Socioeconomic History   Marital status:  Married    Spouse name: [name]   Number of children: 2   Years of education: Not on file   Highest education level: High school graduate  Occupational History    Comment: disability  Tobacco Use   Smoking status: Every Day    Types: Cigarettes   Smokeless tobacco: Current    Types: Snuff  Vaping Use   Vaping Use: Never used  Substance and Sexual Activity   Alcohol use: Not Currently   Drug use: Never   Sexual activity: Not on file  Other Topics Concern   Not on file  Social History Narrative   Lives with wife   Social Determinants of Health   Financial Resource Strain: Not on file   Food Insecurity: No Food Insecurity (08/17/2022)   Hunger Vital Sign    Worried About Running Out of Food in the Last Year: Never true    Ran Out of Food in the Last Year: Never true  Transportation Needs: No Transportation Needs (08/17/2022)   PRAPARE - Hydrologist (Medical): No    Lack of Transportation (Non-Medical): No  Physical Activity: Not on file  Stress: Not on file  Social Connections: Not on file    Family History  Problem Relation Age of Onset   Pulmonary fibrosis Mother    Hypertension Father    Other Father        liver failure   Diabetes Brother    Breast cancer Paternal Aunt    Colon cancer Neg Hx    Esophageal cancer Neg Hx    Stomach cancer Neg Hx    Rectal cancer Neg Hx     Health Maintenance  Topic Date Due   INFLUENZA VACCINE  11/21/2022 (Originally 03/23/2022)   COVID-19 Vaccine (4 - 2023-24 season) 03/01/2023 (Originally 04/23/2022)   DTaP/Tdap/Td (3 - Td or Tdap) 06/20/2029   COLONOSCOPY (Pts 45-13yrs Insurance coverage will need to be confirmed)  09/25/2031   Hepatitis C Screening  Completed   HIV Screening  Completed   Zoster Vaccines- Shingrix  Completed   HPV VACCINES  Aged Out     ----------------------------------------------------------------------------------------------------------------------------------------------------------------------------------------------------------------- Physical Exam BP 135/72   Pulse 85   Temp 98.6 F (37 C)   Ht 5\' 3"  (1.6 m)   Wt 180 lb (81.6 kg)   SpO2 100%   BMI 31.89 kg/m   Physical Exam Constitutional:      Appearance: Normal appearance.  HENT:     Head: Normocephalic and atraumatic.  Eyes:     General: No scleral icterus. Cardiovascular:     Rate and Rhythm: Normal rate and regular rhythm.  Pulmonary:     Effort: Pulmonary effort is normal.     Breath sounds: Normal breath sounds.  Abdominal:     General: There is no distension.     Palpations: Abdomen is  soft.     Tenderness: There is no abdominal tenderness.  Musculoskeletal:     Cervical back: Neck supple.  Neurological:     Mental Status: He is alert.  Psychiatric:        Mood and Affect: Mood normal.        Behavior: Behavior normal.     ------------------------------------------------------------------------------------------------------------------------------------------------------------------------------------------------------------------- Assessment and Plan  Bleeding internal hemorrhoids Recent GI bleed from internal hemorrhoids.  Updating CMP, CBC, PT/INR and iron panel.  Influenza Recommend continued supportive care with increase fluids and over-the-counter medications for symptom control.  Recommend avoidance of NSAIDs due to his platelet dysfunction.  Adding course of Tamiflu x 5 days.   Meds ordered this encounter  Medications   oseltamivir (TAMIFLU) 75 MG capsule    Sig: Take 1 capsule (75 mg total) by mouth 2 (two) times daily for 5 days.    Dispense:  10 capsule    Refill:  0    No follow-ups on file.    This visit occurred during the SARS-CoV-2 public health emergency.  Safety protocols were in place, including screening questions prior to the visit, additional usage of staff PPE, and extensive cleaning of exam room while observing appropriate contact time as indicated for disinfecting solutions.

## 2022-08-31 NOTE — Assessment & Plan Note (Signed)
Recommend continued supportive care with increase fluids and over-the-counter medications for symptom control.  Recommend avoidance of NSAIDs due to his platelet dysfunction.  Adding course of Tamiflu x 5 days.

## 2022-08-31 NOTE — Patient Instructions (Signed)

## 2022-08-31 NOTE — Assessment & Plan Note (Signed)
Recent GI bleed from internal hemorrhoids.  Updating CMP, CBC, PT/INR and iron panel.

## 2022-09-01 ENCOUNTER — Ambulatory Visit: Payer: Non-veteran care | Admitting: Family Medicine

## 2022-09-01 LAB — COMPLETE METABOLIC PANEL WITH GFR
AG Ratio: 0.9 (calc) — ABNORMAL LOW (ref 1.0–2.5)
ALT: 21 U/L (ref 9–46)
AST: 70 U/L — ABNORMAL HIGH (ref 10–35)
Albumin: 3 g/dL — ABNORMAL LOW (ref 3.6–5.1)
Alkaline phosphatase (APISO): 71 U/L (ref 35–144)
BUN: 10 mg/dL (ref 7–25)
CO2: 26 mmol/L (ref 20–32)
Calcium: 8.9 mg/dL (ref 8.6–10.3)
Chloride: 103 mmol/L (ref 98–110)
Creat: 0.82 mg/dL (ref 0.70–1.30)
Globulin: 3.3 g/dL (calc) (ref 1.9–3.7)
Glucose, Bld: 105 mg/dL — ABNORMAL HIGH (ref 65–99)
Potassium: 3.1 mmol/L — ABNORMAL LOW (ref 3.5–5.3)
Sodium: 138 mmol/L (ref 135–146)
Total Bilirubin: 4.8 mg/dL — ABNORMAL HIGH (ref 0.2–1.2)
Total Protein: 6.3 g/dL (ref 6.1–8.1)
eGFR: 104 mL/min/{1.73_m2} (ref >=60)

## 2022-09-01 LAB — CBC WITH DIFFERENTIAL/PLATELET
Absolute Monocytes: 602 cells/uL (ref 200–950)
Basophils Absolute: 82 cells/uL (ref 0–200)
Basophils Relative: 1.9 %
Eosinophils Absolute: 271 cells/uL (ref 15–500)
Eosinophils Relative: 6.3 %
HCT: 25.3 % — ABNORMAL LOW (ref 38.5–50.0)
Hemoglobin: 8.3 g/dL — ABNORMAL LOW (ref 13.2–17.1)
Lymphs Abs: 1127 cells/uL (ref 850–3900)
MCH: 26.6 pg — ABNORMAL LOW (ref 27.0–33.0)
MCHC: 32.8 g/dL (ref 32.0–36.0)
MCV: 81.1 fL (ref 80.0–100.0)
MPV: 9.4 fL (ref 7.5–12.5)
Monocytes Relative: 14 %
Neutro Abs: 2219 cells/uL (ref 1500–7800)
Neutrophils Relative %: 51.6 %
Platelets: 56 10*3/uL — ABNORMAL LOW (ref 140–400)
RBC: 3.12 10*6/uL — ABNORMAL LOW (ref 4.20–5.80)
RDW: 16.2 % — ABNORMAL HIGH (ref 11.0–15.0)
Total Lymphocyte: 26.2 %
WBC: 4.3 10*3/uL (ref 3.8–10.8)

## 2022-09-01 LAB — IRON,TIBC AND FERRITIN PANEL
%SAT: 50 % (calc) — ABNORMAL HIGH (ref 20–48)
Ferritin: 22 ng/mL — ABNORMAL LOW (ref 38–380)
Iron: 142 ug/dL (ref 50–180)
TIBC: 284 mcg/dL (calc) (ref 250–425)

## 2022-09-01 LAB — PROTIME-INR
INR: 1.6 — ABNORMAL HIGH
Prothrombin Time: 16.9 s — ABNORMAL HIGH (ref 9.0–11.5)

## 2022-09-02 ENCOUNTER — Encounter: Payer: Self-pay | Admitting: Family Medicine

## 2022-09-02 ENCOUNTER — Other Ambulatory Visit (HOSPITAL_BASED_OUTPATIENT_CLINIC_OR_DEPARTMENT_OTHER): Payer: Self-pay

## 2022-09-02 MED ORDER — POTASSIUM CHLORIDE CRYS ER 20 MEQ PO TBCR
20.0000 meq | EXTENDED_RELEASE_TABLET | Freq: Two times a day (BID) | ORAL | 1 refills | Status: DC
  Filled 2022-09-02 (×2): qty 180, 90d supply, fill #0
  Filled 2022-11-18: qty 180, 90d supply, fill #1

## 2022-09-02 MED ORDER — MAGDELAY 64 MG PO TBEC
1.0000 | DELAYED_RELEASE_TABLET | Freq: Two times a day (BID) | ORAL | 1 refills | Status: DC
  Filled 2022-09-02: qty 180, fill #0
  Filled 2022-11-18: qty 180, 90d supply, fill #0

## 2022-09-06 ENCOUNTER — Ambulatory Visit (INDEPENDENT_AMBULATORY_CARE_PROVIDER_SITE_OTHER): Payer: Commercial Managed Care - PPO | Admitting: Sports Medicine

## 2022-09-06 ENCOUNTER — Other Ambulatory Visit (HOSPITAL_BASED_OUTPATIENT_CLINIC_OR_DEPARTMENT_OTHER): Payer: Self-pay

## 2022-09-06 DIAGNOSIS — R600 Localized edema: Secondary | ICD-10-CM

## 2022-09-06 DIAGNOSIS — M51369 Other intervertebral disc degeneration, lumbar region without mention of lumbar back pain or lower extremity pain: Secondary | ICD-10-CM

## 2022-09-06 DIAGNOSIS — M5136 Other intervertebral disc degeneration, lumbar region: Secondary | ICD-10-CM | POA: Diagnosis not present

## 2022-09-06 DIAGNOSIS — M5030 Other cervical disc degeneration, unspecified cervical region: Secondary | ICD-10-CM | POA: Diagnosis not present

## 2022-09-06 HISTORY — DX: Localized edema: R60.0

## 2022-09-06 MED ORDER — TRIAMCINOLONE ACETONIDE 0.5 % EX CREA
1.0000 | TOPICAL_CREAM | Freq: Two times a day (BID) | CUTANEOUS | 3 refills | Status: DC
  Filled 2022-09-06: qty 30, 15d supply, fill #0

## 2022-09-06 NOTE — Assessment & Plan Note (Signed)
Doing much better with periscapular radicular symptoms, he had a cervical epidural back in the summertime, we just did a burst of prednisone and home health PT which seems to have done the trick.

## 2022-09-06 NOTE — Assessment & Plan Note (Signed)
Doing okay with zippered compression stockings but having increasing itching. There does appear to be a contact dermatitis anterior lower leg with no signs of bacterial superinfection. Adding topical triamcinolone cream 0.5% twice daily, he can discontinue the compression hose at night. Return to see PCP if not better in couple weeks.

## 2022-09-06 NOTE — Assessment & Plan Note (Signed)
Pleasant 57 year old male, axial right-sided low back pain with symptoms to the knee, posterior buttock, thigh, better with spinal extension. Much better with prednisone and home health physical therapy. CT did show severe DDD and right-sided lower lumbar facet arthropathy both of which can be potential pain generators, he did respond to conservative treatment so we can watch this for now, next step would be an MRI for epidural planning.

## 2022-09-06 NOTE — Progress Notes (Signed)
    Procedures performed today:    None.  Independent interpretation of notes and tests performed by another provider:   None.  Brief History, Exam, Impression, and Recommendations:    Lumbar degenerative disc disease Juan [name] 57 year old male, axial right-sided low back pain with symptoms to the knee, posterior buttock, thigh, better with spinal extension. Much better with prednisone and home health physical therapy. CT did show severe DDD and right-sided lower lumbar facet arthropathy both of which can be potential pain generators, he did respond to conservative treatment so we can watch this for now, next step would be an MRI for epidural planning.  DDD (degenerative disc disease), cervical Doing much better with periscapular radicular symptoms, he had a cervical epidural back in the summertime, we just did a burst of prednisone and home health PT which seems to have done the trick.   Lower extremity edema Doing okay with zippered compression stockings but having increasing itching. There does appear to be a contact dermatitis anterior lower leg with no signs of bacterial superinfection. Adding topical triamcinolone cream 0.5% twice daily, he can discontinue the compression hose at night. Return to see PCP if not better in couple weeks.    ____________________________________________ Gwen Her. Dianah Field, M.D., ABFM., CAQSM., AME. Primary Care and Sports Medicine [facility]  Adjunct Professor of [employer]  Risk manager

## 2022-09-07 ENCOUNTER — Encounter: Payer: Self-pay | Admitting: Internal Medicine

## 2022-09-07 ENCOUNTER — Ambulatory Visit (INDEPENDENT_AMBULATORY_CARE_PROVIDER_SITE_OTHER): Payer: Commercial Managed Care - PPO | Admitting: Internal Medicine

## 2022-09-07 ENCOUNTER — Encounter: Payer: Commercial Managed Care - PPO | Admitting: Internal Medicine

## 2022-09-07 ENCOUNTER — Other Ambulatory Visit (HOSPITAL_BASED_OUTPATIENT_CLINIC_OR_DEPARTMENT_OTHER): Payer: Self-pay

## 2022-09-07 DIAGNOSIS — K7030 Alcoholic cirrhosis of liver without ascites: Secondary | ICD-10-CM | POA: Diagnosis not present

## 2022-09-07 DIAGNOSIS — R2243 Localized swelling, mass and lump, lower limb, bilateral: Secondary | ICD-10-CM | POA: Diagnosis not present

## 2022-09-07 MED ORDER — SPIRONOLACTONE 100 MG PO TABS
100.0000 mg | ORAL_TABLET | Freq: Every day | ORAL | 2 refills | Status: DC
  Filled 2022-09-07: qty 30, 30d supply, fill #0

## 2022-09-07 MED ORDER — FUROSEMIDE 40 MG PO TABS
40.0000 mg | ORAL_TABLET | Freq: Every day | ORAL | 2 refills | Status: DC
  Filled 2022-09-07: qty 30, 30d supply, fill #0

## 2022-09-07 NOTE — Patient Instructions (Addendum)
_______________________________________________________  If your blood pressure at your visit was 140/90 or greater, please contact your primary care physician to follow up on this.  If you are age 55 or younger, your body mass index should be between 19-25. Your Body mass index is 32.77 kg/m. If this is out of the aformentioned range listed, please consider follow up with your Primary Care Provider.  ________________________________________________________  The [place] [facility] providers would like to encourage you to use [facility] to communicate with providers for non-urgent requests or questions.  Due to long hold times on the telephone, sending your provider a message by [facility] may be a faster and more efficient way to get a response.  Please allow 48 business hours for a response.  Please remember that this is for non-urgent requests.  _______________________________________________________  Your provider has requested that you go to the basement level for lab work next week on 09-14-22. Press "B" on the elevator. The lab is located at the first door on the left as you exit the elevator.  Due to recent changes in healthcare laws, you may see the results of your imaging and laboratory studies on MyChart before your provider has had a chance to review them.  We understand that in some cases there may be results that are confusing or concerning to you. Not all laboratory results come back in the same time frame and the provider may be waiting for multiple results in order to interpret others.  Please give [place] 48 hours in order for your provider to thoroughly review all the results before contacting the office for clarification of your results.   We have sent the following medications to your pharmacy for you to pick up at your convenience:  START: Lasix 40mg  one tablet daily  INCREASE: Spironolactone from 25mg  to 100mg  one tablet daily.  You have been scheduled for a lower extremity ultrasound at  [facility] on 09-08-22 at 10:00 am.  Please arrive at 9:45am for registration purposes.  The vascular imagining lab is on the 1st floor.  You are scheduled to follow up in our office on 10-05-22 at 1:30pm.  HEMORRHOID BANDING PROCEDURE    FOLLOW-UP CARE   The procedure you have had should have been relatively painless since the banding of the area involved does not have nerve endings and there is no pain sensation.  The rubber band cuts off the blood supply to the hemorrhoid and the band may fall off as soon as 48 hours after the banding (the band may occasionally be seen in the toilet bowl following a bowel movement). You may notice a temporary feeling of fullness in the rectum which should respond adequately to plain Tylenol or Motrin.  Following the banding, avoid strenuous exercise that evening and resume full activity the next day.  A sitz bath (soaking in a warm tub) or bidet is soothing, and can be useful for cleansing the area after bowel movements.     To avoid constipation, take two tablespoons of natural wheat bran, natural oat bran, flax, Benefiber or any over the counter fiber supplement and increase your water intake to 7-8 glasses daily.    Unless you have been prescribed anorectal medication, do not put anything inside your rectum for two weeks: No suppositories, enemas, fingers, etc.  Occasionally, you may have more bleeding than usual after the banding procedure.  This is often from the untreated hemorrhoids rather than the treated one.  Don't be concerned if there is a tablespoon  or so of blood.  If there is more blood than this, lie flat with your bottom higher than your head and apply an ice pack to the area. If the bleeding does not stop within a half an hour or if you feel faint, call our office at [phone] or go to the emergency room.  Problems are not common; however, if there is a substantial amount of bleeding, severe pain, chills, fever or  difficulty passing urine (very rare) or other problems, you should call us at [phone] or report to the nearest emergency room.  Do not stay seated continuously for more than 2-3 hours for a day or two after the procedure.  Tighten your buttock muscles 10-15 times every two hours and take 10-15 deep breaths every 1-2 hours.  Do not spend more than a few minutes on the toilet if you cannot empty your bowel; instead re-visit the toilet at a later time.   Thank you for entrusting me with your care and choosing [facility].  Dr [name]

## 2022-09-07 NOTE — Progress Notes (Signed)
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 He has tried to follow with a low sodium diet, but he is drinking about 6 small Gatorades per day and is eating frozen meals, which may contain more sodium than he is supposed to be eating. Denies alcohol use. Last time that he had alcohol was 276 days ago. He has started noticing significant swelling in both legs. The swelling is worse in the left leg than the right leg. 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 Denies significant abdominal swelling. Denies ab pain, N&V, confusion, hematochezia, or melena. He has on average 3-4 BMs per day. He is urinating well. He was recently started on mirtazapine to help with sleep, which he is not sure whether or not it is working. He was recently hospitalized 12/25-12/27/23 for rectal bleeding that was found to be due to hemorrhoids. 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     hydrocortisone (ANUSOL-HC) 25 MG suppository Place 1 suppository (25 mg total) rectally 2 (two) times daily. 12 suppository 0   lactulose, encephalopathy, (CHRONULAC) 10 GM/15ML SOLN Take 10 g by mouth daily as needed (constipation).     LORazepam (ATIVAN) 2 MG tablet Take 2 mg by mouth every 6 (six) hours as needed for anxiety.     Magnesium Chloride 64 MG TABS Take 1 tablet by mouth in the morning and at bedtime. 180 tablet 1   ondansetron (ZOFRAN-ODT) 8 MG disintegrating tablet Dissolve 1 tablet (8 mg total) by mouth every 8 (eight) hours as needed for nausea. 20  tablet 1   pantoprazole (PROTONIX) 40 MG tablet Take 1 tablet (40 mg total) by mouth daily. 60 tablet 1   potassium chloride SA (KLOR-CON M) 20 MEQ tablet Take 1 tablet (20 mEq total) by mouth 2 (two) times daily. 180 tablet 1   prochlorperazine (COMPAZINE) 10 MG tablet Take 1 tablet (10 mg total) by mouth every 6 (six) hours as needed for nausea or vomiting. 30 tablet 3   saccharomyces boulardii (FLORASTOR) 250 MG capsule Take 250 mg by mouth 2 (two) times daily.      spironolactone (ALDACTONE) 25 MG tablet Take 1 tablet (25 mg total) by mouth daily. 90 tablet 3   triamcinolone cream (KENALOG) 0.5 % Apply 1 Application topically 2 (two) times daily. To affected areas. 30 g 3   vortioxetine HBr (TRINTELLIX) 10 MG TABS tablet Take 1 tablet (10 mg total) by mouth daily. 30 tablet 3   No current facility-administered medications for this visit.   Physical Exam: BP 110/60   Pulse 88   Ht 5\' 3"  (1.6 m)   Wt 185 lb (83.9 kg)   BMI 32.77 kg/m  Constitutional: Pleasant,well-developed, male in no acute distress. HEENT: Normocephalic and atraumatic. Conjunctivae are normal. Mild scleral icterus Cardiovascular: Systolic murmur Pulmonary/chest: Effort normal and breath sounds normal. No wheezing, rales or rhonchi. Abdominal: Soft, mildly distended, nontender. Bowel sounds active throughout. There are no masses palpable. No hepatomegaly.  Extremities: 2+ BLE edema (L>R slightly) Neurological: Alert and oriented.  No asterixis Skin: Skin is warm and dry. No rashes noted. Psychiatric: Normal mood and affect. Behavior is normal.  Labs 03/2021: Normal iron, normal ferritin.  Labs 08/2021: Positive PeTH.  Had some issues with hematochezia at that time.  Last CBC with hemoglobin of 10.9 and platelets of 68.  Last BMP unremarkable.  Last set of LFTs shows low albumin of 2.9, elevated AST of 94, ALT normal, normal alk phos, and elevated total bilirubin of 3.1.  Labs 03/2022: CBC with low plts of 64 and low Hb  of 9.9. CMP with low potassium of 3 and elevated total bilirubin of 2.1 and elevated AST of 87. TSH nml.   Labs 08/2022: CBC with Hb of 8.3 and platelets of 56. CMP with low potassium of 3.1, low albumin of 3, elevated AST of 70, and elevated T bili of 4.8. INR elevated at 1.6. Ferritin low at 22%.   MELD 3.0: 22 at 08/18/2022  5:24 AM MELD-Na: 18 at 08/31/2022 11:45 AM Calculated from: Serum Creatinine: 0.84 mg/dL (Using min of 1 mg/dL) at 16/05/9603  5:40 AM Serum Sodium: 137 mmol/L at 08/18/2022  5:24 AM Total Bilirubin: 4.1 mg/dL at 98/06/9146 82:95 AM Serum Albumin: 2.5 g/dL at 62/13/0865 78:46 AM INR(ratio): 2.3 at 08/18/2022  5:24 AM Age at listing (hypothetical): 56 years Sex: Male at 08/18/2022  5:24 AM  RUQ U/S 09/18/21: IMPRESSION: 1. Echogenic liver with coarsened echotexture and nodular surface contour suggestive of cirrhosis. Evaluation of the liver parenchyma is limited by poor penetration. No obvious liver lesions identified. 2. Cholelithiasis and mild diffuse gallbladder wall thickening. Wall thickening could be secondary to underlying liver disease versus inflammation. No sonographic Eulah Pont sign was evident on exam. If there is high clinical suspicion for cholecystitis, a nuclear medicine hepatobiliary scan may be helpful to further assess.  CTA A/P w/contrast 03/01/22: NON-VASCULAR Changes of cirrhosis with portal venous hypertension. Recanalized umbilical vein and spontaneous left splenorenal shunt. Mild perihepatic and right abdominal ascites. Cholelithiasis.  CTA GI Bleed 08/17/22: IMPRESSION: VASCULAR 1. No evidence of active bleeding into the small bowel or colon. 2. 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 NON-VASCULAR 1. Sequela of ascites and portal hypertension. 2. Small moderate ascites.  EGD 09/24/21: - LA Grade C esophagitis with no bleeding. Biopsied. - Medium-sized hiatal  hernia. - Portal hypertensive gastropathy. - Erythematous mucosa in the antrum. Biopsied. - Normal examined duodenum. Path: . STOMACH, BIOPSY:  -  Reactive gastropathy with mild foveolar hyperplasia.  -  Suggestive of mild vascular ectasia, without thrombi.  -  Negative for an inflammatory infiltrate predictive of Helicobacter  pylori infection.  -  Negative for intestinal metaplasia.  -  Negative for malignancy.   B. ESOPHAGUS, BIOPSY:  -  Squamocolumnar mucosa with no specific pathologic diagnosis (scant  columnar epithelium present).  -  Negative for acute esophagitis and intrasquamous eosinophils.  -  Negative for intestinal metaplasia.  -  No viral cytopathic change or fungi identified (on HE).  -  Negative for dysplasia and malignancy  Colonoscopy 09/24/21: - The examined portion of the ileum was normal. - A tattoo was seen in the sigmoid colon. - Non-bleeding internal hemorrhoids.  EGD 11/20/21: - Normal esophagus. - Medium-sized hiatal hernia. - Portal hypertensive gastropathy. - Normal examined duodenum. - No specimens collected.  EGD 08/18/22:   Flexible sigmoidoscopy 08/18/22:    ASSESSMENT AND PLAN: EtOH cirrhosis History of esophagitis Patient  presents for follow up of alcoholic cirrhosis. He has noted significant worsening of BLE edema. Thus will go up on his diuretics and I encouraged him to be better about following a low sodium diet. Since patient also has history of DVT, will plan for BLE dopplers to rule out DVT. Had an episode of rectal bleeding that was attributed to hemorrhoidal bleeding so will plan for hemorrhoidal banding today.  - Continue lactulose therapy for hepatic encephalopathy - Encourage him to follow a low sodium diet (<2 grams per day) - Encouraged him to get his hep A and B vaccinations with his PCP. Patient declined to get vaccines here - Cont PPI QD - Start Lasix 40 mg QD - Increase spironolactone to 100 mg QD - Recheck BMP in 1  week - Bilateral lower extremity dopplers to rule out DVT - Will need repeat abdominal imaging for HCC screening in 12/2022 - Hemorrhoidal banding today - RTC 4 weeks for 2nd banding  [name], MD  I spent 46 minutes of time, including in depth chart review, independent review of results as outlined above, communicating results with the patient directly, face-to-face time with the patient, coordinating care, ordering studies and medications as appropriate, and documentation.  PROCEDURE NOTE: The patient presents with symptomatic grade 1  hemorrhoids, requesting rubber band ligation of his/her hemorrhoidal disease.  All risks, benefits and alternative forms of therapy were described and informed consent was obtained.  In the Left Lateral Decubitus position anoscopic examination revealed grade 1 hemorrhoids The anorectum was pre-medicated with nitroglycerin and Recticare The decision was made to band the right anterior internal hemorrhoid, and the Berkey was used to perform band ligation without complication.  Digital anorectal examination was then performed to assure proper positioning of the band, and to adjust the banded tissue as required.  The patient was discharged home without pain or other issues.  Dietary and behavioral recommendations were given and along with follow-up instructions.     The following adjunctive treatments were recommended: High fiber diet, avoid straining  The patient will return in 4 weeks for  follow-up and possible additional banding as required. No complications were encountered and the patient tolerated the procedure well.

## 2022-09-08 ENCOUNTER — Telehealth: Payer: Self-pay | Admitting: Internal Medicine

## 2022-09-08 ENCOUNTER — Ambulatory Visit (HOSPITAL_BASED_OUTPATIENT_CLINIC_OR_DEPARTMENT_OTHER)
Admission: RE | Admit: 2022-09-08 | Discharge: 2022-09-08 | Disposition: A | Discharge status: Home / Self Care | Payer: Commercial Managed Care - PPO | Source: Ambulatory Visit | Attending: Internal Medicine | Admitting: Internal Medicine

## 2022-09-08 DIAGNOSIS — R2243 Localized swelling, mass and lump, lower limb, bilateral: Secondary | ICD-10-CM

## 2022-09-08 DIAGNOSIS — K7030 Alcoholic cirrhosis of liver without ascites: Secondary | ICD-10-CM | POA: Diagnosis not present

## 2022-09-08 DIAGNOSIS — M7989 Other specified soft tissue disorders: Secondary | ICD-10-CM | POA: Diagnosis not present

## 2022-09-08 NOTE — Telephone Encounter (Signed)
Patient had procedure yesterday, is wondering if he can take a stool softener because he is having severe bloating.

## 2022-09-08 NOTE — Telephone Encounter (Signed)
Dr. [name], Pt had banding with you yesterday in the office.  States he is having bloating in his lower abdomen like he needs to have a bowel movement but does not want to strain.  Is it ok for him to take a laxative or stool softener?

## 2022-09-08 NOTE — Telephone Encounter (Signed)
Per Dr [name] it is ok for pt to take a stool softener or laxative.  LM on pts VM as he instructed.  Advised him to call back with any further questions.

## 2022-09-09 ENCOUNTER — Other Ambulatory Visit (HOSPITAL_BASED_OUTPATIENT_CLINIC_OR_DEPARTMENT_OTHER): Payer: Self-pay

## 2022-09-13 ENCOUNTER — Encounter: Payer: Self-pay | Admitting: Internal Medicine

## 2022-09-13 ENCOUNTER — Inpatient Hospital Stay (HOSPITAL_BASED_OUTPATIENT_CLINIC_OR_DEPARTMENT_OTHER)
Admission: EM | Admit: 2022-09-13 | Discharge: 2022-09-15 | DRG: 813 | Disposition: A | Discharge status: Home / Self Care | Payer: No Typology Code available for payment source | Attending: Internal Medicine | Admitting: Internal Medicine

## 2022-09-13 ENCOUNTER — Encounter (HOSPITAL_COMMUNITY): Payer: Self-pay

## 2022-09-13 ENCOUNTER — Emergency Department (HOSPITAL_BASED_OUTPATIENT_CLINIC_OR_DEPARTMENT_OTHER): Payer: No Typology Code available for payment source

## 2022-09-13 ENCOUNTER — Other Ambulatory Visit: Payer: Self-pay

## 2022-09-13 ENCOUNTER — Encounter (HOSPITAL_BASED_OUTPATIENT_CLINIC_OR_DEPARTMENT_OTHER): Payer: Self-pay | Admitting: Emergency Medicine

## 2022-09-13 DIAGNOSIS — Z1152 Encounter for screening for COVID-19: Secondary | ICD-10-CM

## 2022-09-13 DIAGNOSIS — R600 Localized edema: Secondary | ICD-10-CM | POA: Diagnosis present

## 2022-09-13 DIAGNOSIS — E876 Hypokalemia: Secondary | ICD-10-CM | POA: Diagnosis present

## 2022-09-13 DIAGNOSIS — D689 Coagulation defect, unspecified: Secondary | ICD-10-CM | POA: Diagnosis present

## 2022-09-13 DIAGNOSIS — K709 Alcoholic liver disease, unspecified: Secondary | ICD-10-CM | POA: Diagnosis present

## 2022-09-13 DIAGNOSIS — F419 Anxiety disorder, unspecified: Secondary | ICD-10-CM | POA: Diagnosis present

## 2022-09-13 DIAGNOSIS — D649 Anemia, unspecified: Secondary | ICD-10-CM | POA: Diagnosis present

## 2022-09-13 DIAGNOSIS — K766 Portal hypertension: Secondary | ICD-10-CM | POA: Diagnosis not present

## 2022-09-13 DIAGNOSIS — K920 Hematemesis: Principal | ICD-10-CM | POA: Diagnosis present

## 2022-09-13 DIAGNOSIS — Z8619 Personal history of other infectious and parasitic diseases: Secondary | ICD-10-CM | POA: Diagnosis not present

## 2022-09-13 DIAGNOSIS — D6959 Other secondary thrombocytopenia: Principal | ICD-10-CM | POA: Diagnosis present

## 2022-09-13 DIAGNOSIS — D696 Thrombocytopenia, unspecified: Secondary | ICD-10-CM | POA: Diagnosis present

## 2022-09-13 DIAGNOSIS — K3189 Other diseases of stomach and duodenum: Secondary | ICD-10-CM | POA: Diagnosis not present

## 2022-09-13 DIAGNOSIS — F1020 Alcohol dependence, uncomplicated: Secondary | ICD-10-CM | POA: Diagnosis present

## 2022-09-13 DIAGNOSIS — E739 Lactose intolerance, unspecified: Secondary | ICD-10-CM | POA: Diagnosis present

## 2022-09-13 DIAGNOSIS — Z8249 Family history of ischemic heart disease and other diseases of the circulatory system: Secondary | ICD-10-CM

## 2022-09-13 DIAGNOSIS — Z9102 Food additives allergy status: Secondary | ICD-10-CM

## 2022-09-13 DIAGNOSIS — Z91013 Allergy to seafood: Secondary | ICD-10-CM

## 2022-09-13 DIAGNOSIS — R040 Epistaxis: Secondary | ICD-10-CM

## 2022-09-13 DIAGNOSIS — E871 Hypo-osmolality and hyponatremia: Secondary | ICD-10-CM | POA: Diagnosis present

## 2022-09-13 DIAGNOSIS — F17210 Nicotine dependence, cigarettes, uncomplicated: Secondary | ICD-10-CM | POA: Diagnosis present

## 2022-09-13 DIAGNOSIS — Z8719 Personal history of other diseases of the digestive system: Secondary | ICD-10-CM

## 2022-09-13 DIAGNOSIS — Z91018 Allergy to other foods: Secondary | ICD-10-CM

## 2022-09-13 DIAGNOSIS — Z79899 Other long term (current) drug therapy: Secondary | ICD-10-CM

## 2022-09-13 DIAGNOSIS — Z91010 Allergy to peanuts: Secondary | ICD-10-CM

## 2022-09-13 DIAGNOSIS — D62 Acute posthemorrhagic anemia: Secondary | ICD-10-CM | POA: Diagnosis not present

## 2022-09-13 DIAGNOSIS — F17290 Nicotine dependence, other tobacco product, uncomplicated: Secondary | ICD-10-CM | POA: Diagnosis present

## 2022-09-13 DIAGNOSIS — I10 Essential (primary) hypertension: Secondary | ICD-10-CM | POA: Diagnosis present

## 2022-09-13 DIAGNOSIS — J90 Pleural effusion, not elsewhere classified: Secondary | ICD-10-CM | POA: Diagnosis not present

## 2022-09-13 DIAGNOSIS — G721 Alcoholic myopathy: Secondary | ICD-10-CM | POA: Diagnosis not present

## 2022-09-13 DIAGNOSIS — E872 Acidosis, unspecified: Secondary | ICD-10-CM | POA: Diagnosis not present

## 2022-09-13 DIAGNOSIS — R310 Gross hematuria: Secondary | ICD-10-CM | POA: Diagnosis not present

## 2022-09-13 DIAGNOSIS — K921 Melena: Secondary | ICD-10-CM | POA: Diagnosis present

## 2022-09-13 DIAGNOSIS — K2100 Gastro-esophageal reflux disease with esophagitis, without bleeding: Secondary | ICD-10-CM | POA: Diagnosis present

## 2022-09-13 DIAGNOSIS — Z881 Allergy status to other antibiotic agents status: Secondary | ICD-10-CM

## 2022-09-13 DIAGNOSIS — Z86718 Personal history of other venous thrombosis and embolism: Secondary | ICD-10-CM

## 2022-09-13 DIAGNOSIS — F32A Depression, unspecified: Secondary | ICD-10-CM | POA: Diagnosis not present

## 2022-09-13 DIAGNOSIS — Z9852 Vasectomy status: Secondary | ICD-10-CM

## 2022-09-13 DIAGNOSIS — T501X5A Adverse effect of loop [high-ceiling] diuretics, initial encounter: Secondary | ICD-10-CM | POA: Diagnosis present

## 2022-09-13 DIAGNOSIS — R188 Other ascites: Secondary | ICD-10-CM

## 2022-09-13 DIAGNOSIS — R601 Generalized edema: Secondary | ICD-10-CM | POA: Diagnosis not present

## 2022-09-13 DIAGNOSIS — R5382 Chronic fatigue, unspecified: Secondary | ICD-10-CM | POA: Diagnosis present

## 2022-09-13 DIAGNOSIS — R2243 Localized swelling, mass and lump, lower limb, bilateral: Secondary | ICD-10-CM

## 2022-09-13 DIAGNOSIS — K7460 Unspecified cirrhosis of liver: Secondary | ICD-10-CM | POA: Diagnosis present

## 2022-09-13 DIAGNOSIS — Z888 Allergy status to other drugs, medicaments and biological substances status: Secondary | ICD-10-CM

## 2022-09-13 DIAGNOSIS — K7031 Alcoholic cirrhosis of liver with ascites: Secondary | ICD-10-CM | POA: Diagnosis present

## 2022-09-13 DIAGNOSIS — K7030 Alcoholic cirrhosis of liver without ascites: Secondary | ICD-10-CM

## 2022-09-13 DIAGNOSIS — Z885 Allergy status to narcotic agent status: Secondary | ICD-10-CM

## 2022-09-13 HISTORY — DX: Hematemesis: K92.0

## 2022-09-13 LAB — COMPREHENSIVE METABOLIC PANEL
ALT: 20 U/L (ref 0–44)
AST: 67 U/L — ABNORMAL HIGH (ref 15–41)
Albumin: 2.3 g/dL — ABNORMAL LOW (ref 3.5–5.0)
Alkaline Phosphatase: 59 U/L (ref 38–126)
Anion gap: 7 (ref 5–15)
BUN: 11 mg/dL (ref 6–20)
CO2: 20 mmol/L — ABNORMAL LOW (ref 22–32)
Calcium: 7.9 mg/dL — ABNORMAL LOW (ref 8.9–10.3)
Chloride: 104 mmol/L (ref 98–111)
Creatinine, Ser: 0.75 mg/dL (ref 0.61–1.24)
GFR, Estimated: >60 mL/min (ref >60)
Glucose, Bld: 110 mg/dL — ABNORMAL HIGH (ref 70–99)
Potassium: 2.9 mmol/L — ABNORMAL LOW (ref 3.5–5.1)
Sodium: 131 mmol/L — ABNORMAL LOW (ref 135–145)
Total Bilirubin: 3.1 mg/dL — ABNORMAL HIGH (ref 0.3–1.2)
Total Protein: 6 g/dL — ABNORMAL LOW (ref 6.5–8.1)

## 2022-09-13 LAB — URINALYSIS, ROUTINE W REFLEX MICROSCOPIC
Bilirubin Urine: NEGATIVE
Glucose, UA: NEGATIVE mg/dL
Hgb urine dipstick: NEGATIVE
Ketones, ur: NEGATIVE mg/dL
Leukocytes,Ua: NEGATIVE
Nitrite: NEGATIVE
Protein, ur: NEGATIVE mg/dL
Specific Gravity, Urine: 1.015 (ref 1.005–1.030)
pH: 7 (ref 5.0–8.0)

## 2022-09-13 LAB — RESP PANEL BY RT-PCR (RSV, FLU A&B, COVID)  RVPGX2
Influenza A by PCR: NEGATIVE
Influenza B by PCR: NEGATIVE
Resp Syncytial Virus by PCR: NEGATIVE
SARS Coronavirus 2 by RT PCR: NEGATIVE

## 2022-09-13 LAB — CBC WITH DIFFERENTIAL/PLATELET
Abs Immature Granulocytes: 0.02 10*3/uL (ref 0.00–0.07)
Basophils Absolute: 0.1 10*3/uL (ref 0.0–0.1)
Basophils Relative: 1 %
Eosinophils Absolute: 0.2 10*3/uL (ref 0.0–0.5)
Eosinophils Relative: 3 %
HCT: 23.1 % — ABNORMAL LOW (ref 39.0–52.0)
Hemoglobin: 7.3 g/dL — ABNORMAL LOW (ref 13.0–17.0)
Immature Granulocytes: 0 %
Lymphocytes Relative: 20 %
Lymphs Abs: 1.2 10*3/uL (ref 0.7–4.0)
MCH: 25.7 pg — ABNORMAL LOW (ref 26.0–34.0)
MCHC: 31.6 g/dL (ref 30.0–36.0)
MCV: 81.3 fL (ref 80.0–100.0)
Monocytes Absolute: 1 10*3/uL (ref 0.1–1.0)
Monocytes Relative: 16 %
Neutro Abs: 3.7 10*3/uL (ref 1.7–7.7)
Neutrophils Relative %: 60 %
Platelets: 70 10*3/uL — ABNORMAL LOW (ref 150–400)
RBC: 2.84 MIL/uL — ABNORMAL LOW (ref 4.22–5.81)
RDW: 19.9 % — ABNORMAL HIGH (ref 11.5–15.5)
WBC: 6.1 10*3/uL (ref 4.0–10.5)
nRBC: 0 % (ref 0.0–0.2)

## 2022-09-13 LAB — LACTIC ACID, PLASMA
Lactic Acid, Venous: 2.3 mmol/L (ref 0.5–1.9)
Lactic Acid, Venous: 2.4 mmol/L (ref 0.5–1.9)

## 2022-09-13 LAB — PROTIME-INR
INR: 2.2 — ABNORMAL HIGH (ref 0.8–1.2)
Prothrombin Time: 24.1 seconds — ABNORMAL HIGH (ref 11.4–15.2)

## 2022-09-13 LAB — OCCULT BLOOD X 1 CARD TO LAB, STOOL: Fecal Occult Bld: POSITIVE — AB

## 2022-09-13 MED ORDER — LACTATED RINGERS IV BOLUS
250.0000 mL | Freq: Once | INTRAVENOUS | Status: AC
  Administered 2022-09-13: 250 mL via INTRAVENOUS

## 2022-09-13 MED ORDER — POTASSIUM CHLORIDE CRYS ER 20 MEQ PO TBCR
40.0000 meq | EXTENDED_RELEASE_TABLET | Freq: Once | ORAL | Status: AC
  Administered 2022-09-13: 40 meq via ORAL
  Filled 2022-09-13: qty 2

## 2022-09-13 MED ORDER — SODIUM CHLORIDE 0.9 % IV SOLN
1.0000 g | Freq: Once | INTRAVENOUS | Status: AC
  Administered 2022-09-13: 1 g via INTRAVENOUS
  Filled 2022-09-13: qty 10

## 2022-09-13 MED ORDER — POTASSIUM CHLORIDE 10 MEQ/100ML IV SOLN
10.0000 meq | INTRAVENOUS | Status: AC
  Administered 2022-09-13 (×3): 10 meq via INTRAVENOUS
  Filled 2022-09-13: qty 100

## 2022-09-13 NOTE — ED Notes (Signed)
Called [facility] for transport at 8:43.

## 2022-09-13 NOTE — ED Provider Notes (Signed)
[facility] Provider Note   CSN: [id number] Arrival date & time: 09/13/22  1433     History  Chief Complaint  Patient presents with   Fever    Juan [name] is a 57 y.o. male.  57 year old male with past medical history significant for cirrhosis, recent admission for GI bleed presents today for evaluation of fever, fatigue, hematemesis that occurred this morning.  He states he has had 2 episode of grossly bloody hematemesis.  Fever of 101 today.  He states he had chills yesterday but did not check his temperature until today.  Took DayQuil which improved his fever.  States they called their gastroenterologist who recommended they come into the emergency department.  Denies any melanotic stools, or bright red blood per rectum.  States he was recently admitted for lower GI bleed secondary to hemorrhoids.  Last Tuesday he had the hemorrhoid banding procedure.  The history is provided by the patient. No language interpreter was used.       Home Medications Prior to Admission medications   Medication Sig Start Date End Date Taking? Authorizing Provider  busPIRone (BUSPAR) 15 MG tablet Take 15 mg by mouth 2 (two) times daily.    [provider]  furosemide (LASIX) 40 MG tablet Take 1 tablet (40 mg total) by mouth daily. 09/07/22   Sharyn Creamer, MD  hydrocortisone (ANUSOL-HC) 25 MG suppository Place 1 suppository (25 mg total) rectally 2 (two) times daily. 08/18/22   Nita Sells, MD  lactulose, encephalopathy, (CHRONULAC) 10 GM/15ML SOLN Take 10 g by mouth daily as needed (constipation).    [provider]  LORazepam (ATIVAN) 2 MG tablet Take 2 mg by mouth every 6 (six) hours as needed for anxiety.    [provider]  Magnesium Chloride 64 MG TABS Take 1 tablet by mouth in the morning and at bedtime. 09/02/22   [name], DO  ondansetron (ZOFRAN-ODT) 8 MG disintegrating tablet Dissolve 1 tablet (8 mg  total) by mouth every 8 (eight) hours as needed for nausea. 04/09/22   [name], DO  pantoprazole (PROTONIX) 40 MG tablet Take 1 tablet (40 mg total) by mouth daily. 07/20/22 07/20/23  Sharyn Creamer, MD  potassium chloride SA (KLOR-CON M) 20 MEQ tablet Take 1 tablet (20 mEq total) by mouth 2 (two) times daily. 09/02/22 12/02/22  [name], DO  prochlorperazine (COMPAZINE) 10 MG tablet Take 1 tablet (10 mg total) by mouth every 6 (six) hours as needed for nausea or vomiting. 07/01/22   Silverio Decamp, MD  saccharomyces boulardii (FLORASTOR) 250 MG capsule Take 250 mg by mouth 2 (two) times daily.     [provider]  spironolactone (ALDACTONE) 100 MG tablet Take 1 tablet (100 mg total) by mouth daily. 09/07/22   Sharyn Creamer, MD  triamcinolone cream (KENALOG) 0.5 % Apply 1 Application topically 2 (two) times daily. To affected areas. 09/06/22   Silverio Decamp, MD  vortioxetine HBr (TRINTELLIX) 10 MG TABS tablet Take 1 tablet (10 mg total) by mouth daily. 09/02/21   [name], DO      Allergies    Apple juice, Cucumber extract, Depakote er [divalproex sodium er], Peanut butter flavor, Peanut oil, Peanut-containing drug products, Shellfish allergy, Shrimp extract allergy skin test, Strawberry extract, Valproic acid, Apple, Cantaloupe extract allergy skin test, Codeine, Lactose, and Vancomycin    Review of Systems   Review of Systems  Constitutional:  Positive for fever. Negative for appetite  change.  Respiratory:  Negative for shortness of breath.   Cardiovascular:  Positive for leg swelling. Negative for chest pain.  Gastrointestinal:  Positive for vomiting. Negative for abdominal pain and blood in stool.  Genitourinary:  Negative for dysuria.  Neurological:  Negative for light-headedness.  All other systems reviewed and are negative.   Physical Exam Updated Vital Signs BP 117/80 (BP Location: Left Arm)   Pulse 77   Temp 98.8 F (37.1 C) (Oral)   Resp  20   Wt 84 kg   SpO2 97%   BMI 32.80 kg/m  Physical Exam Vitals and nursing note reviewed.  Constitutional:      General: He is not in acute distress.    Appearance: Normal appearance. He is not ill-appearing.  HENT:     Head: Normocephalic and atraumatic.     Nose: Nose normal.  Eyes:     General: No scleral icterus.    Extraocular Movements: Extraocular movements intact.     Conjunctiva/sclera: Conjunctivae normal.  Cardiovascular:     Rate and Rhythm: Normal rate and regular rhythm.     Pulses: Normal pulses.  Pulmonary:     Effort: Pulmonary effort is normal. No respiratory distress.     Breath sounds: Normal breath sounds. No wheezing or rales.  Abdominal:     General: There is distension.     Palpations: Abdomen is soft.     Tenderness: There is no abdominal tenderness. There is no guarding.  Musculoskeletal:        General: Normal range of motion.     Cervical back: Normal range of motion.     Right lower leg: Edema (2+ pitting edema) present.     Left lower leg: Edema (2+ pitting edema) present.  Skin:    General: Skin is warm and dry.  Neurological:     General: No focal deficit present.     Mental Status: He is alert and oriented to person, place, and time. Mental status is at baseline.     ED Results / Procedures / Treatments   Labs (all labs ordered are listed, but only abnormal results are displayed) Labs Reviewed  LACTIC ACID, PLASMA - Abnormal; Notable for the following components:      Result Value   Lactic Acid, Venous 2.3 (*)    All other components within normal limits  COMPREHENSIVE METABOLIC PANEL - Abnormal; Notable for the following components:   Sodium 131 (*)    Potassium 2.9 (*)    CO2 20 (*)    Glucose, Bld 110 (*)    Calcium 7.9 (*)    Total Protein 6.0 (*)    Albumin 2.3 (*)    AST 67 (*)    Total Bilirubin 3.1 (*)    All other components within normal limits  CBC WITH DIFFERENTIAL/PLATELET - Abnormal; Notable for the following  components:   RBC 2.84 (*)    Hemoglobin 7.3 (*)    HCT 23.1 (*)    MCH 25.7 (*)    RDW 19.9 (*)    Platelets 70 (*)    All other components within normal limits  RESP PANEL BY RT-PCR (RSV, FLU A&B, COVID)  RVPGX2  LACTIC ACID, PLASMA  URINALYSIS, ROUTINE W REFLEX MICROSCOPIC  OCCULT BLOOD X 1 CARD TO LAB, STOOL    EKG None  Radiology DG Chest 2 View  Result Date: 09/13/2022 CLINICAL DATA:  Shortness of breath EXAM: CHEST - 2 VIEW COMPARISON:  05/05/2022 FINDINGS: The heart size and  mediastinal contours are within normal limits. Low lung volumes. Small bilateral pleural effusions with bibasilar atelectasis. No pneumothorax. The visualized skeletal structures are unremarkable. IMPRESSION: Small bilateral pleural effusions with bibasilar atelectasis. Electronically Signed   By: Duanne Guess D.O.   On: 09/13/2022 15:39    Procedures Procedures    Medications Ordered in ED Medications  lactated ringers bolus 250 mL (has no administration in time range)    ED Course/ Medical Decision Making/ A&P Clinical Course as of 09/13/22 1906  Mon Sep 13, 2022  1843 Discussed with Dr. Rhea Belton who agrees with admission for observation.  Given recent scopes he has low suspicion for GI bleed and feels this may be due to the epistaxis.  He states if he has another episode of hematemesis without witnessed episode of epistaxis that it may be more likely to related to the esophagitis.  Discussed patient's lactic acidosis of 2.4.  He states patient despite the ascites, and peripheral edema will be able to handle volume however unless patient becomes hemodynamically compromised he would not give additional fluids.  Does recommend in the setting of ascites and hematemesis to give 1 g of Rocephin for SBP prophylaxis. [AA]    Clinical Course User Index [AA] Marita Kansas, PA-C                             Medical Decision Making Amount and/or Complexity of Data Reviewed Labs: ordered. Radiology:  ordered.  Risk Prescription drug management. Decision regarding hospitalization.   Medical Decision Making / ED Course   This patient presents to the ED for concern of fever, hematemesis, this involves an extensive number of treatment options, and is a complaint that carries with it a high risk of complications and morbidity.  The differential diagnosis includes GI bleed, epistaxis  MDM: 56 year old male with past medical history significant for cirrhosis, without varices presents for 2 episodes of hematemesis.  Also reports he had a fever at home of 101.  He reached out to his gastroenterology office who recommended he come into the emergency department.  No witnessed episodes of hematemesis in the emergency department.  Hemoglobin of 7.3 which is slightly below his recent baseline of about 8.  However he was in the mid sevens at the time of discharge from admission for GI bleed in December 2023.  Lactic acidosis with lactic acid of 2.4.  Guaiac positive stools.  COVID, flu, and RSV negative.  Chest x-ray without evidence of pneumonia.  Case discussed with gastroenterologist who was in agreement with admission for observation.  They will follow along during the admission.  Will give 1 g of ceftriaxone for SBP prophylaxis.  Will discuss with hospitalist.  Patient without hemodynamic instability.  Normal blood pressure, normal heart rate.  Discussed with Dr. Julian Reil who will accept patient for admission.  Patient and wife updated and are in agreement with plan.  No evidence of active epistaxis currently.  Previous episode of epistaxis was around 3 AM.  Lab Tests: -I ordered, reviewed, and interpreted labs.   The pertinent results include:   Labs Reviewed  LACTIC ACID, PLASMA - Abnormal; Notable for the following components:      Result Value   Lactic Acid, Venous 2.4 (*)    All other components within normal limits  LACTIC ACID, PLASMA - Abnormal; Notable for the following components:    Lactic Acid, Venous 2.3 (*)    All other components within normal limits  COMPREHENSIVE METABOLIC PANEL - Abnormal; Notable for the following components:   Sodium 131 (*)    Potassium 2.9 (*)    CO2 20 (*)    Glucose, Bld 110 (*)    Calcium 7.9 (*)    Total Protein 6.0 (*)    Albumin 2.3 (*)    AST 67 (*)    Total Bilirubin 3.1 (*)    All other components within normal limits  CBC WITH DIFFERENTIAL/PLATELET - Abnormal; Notable for the following components:   RBC 2.84 (*)    Hemoglobin 7.3 (*)    HCT 23.1 (*)    MCH 25.7 (*)    RDW 19.9 (*)    Platelets 70 (*)    All other components within normal limits  OCCULT BLOOD X 1 CARD TO LAB, STOOL - Abnormal; Notable for the following components:   Fecal Occult Bld POSITIVE (*)    All other components within normal limits  PROTIME-INR - Abnormal; Notable for the following components:   Prothrombin Time 24.1 (*)    INR 2.2 (*)    All other components within normal limits  RESP PANEL BY RT-PCR (RSV, FLU A&B, COVID)  RVPGX2  URINALYSIS, ROUTINE W REFLEX MICROSCOPIC      EKG  EKG Interpretation  Date/Time:    Ventricular Rate:    PR Interval:    QRS Duration:   QT Interval:    QTC Calculation:   R Axis:     Text Interpretation:           Imaging Studies ordered: I ordered imaging studies including chest x-ray I independently visualized and interpreted imaging. I agree with the radiologist interpretation   Medicines ordered and prescription drug management: Meds ordered this encounter  Medications   lactated ringers bolus 250 mL   cefTRIAXone (ROCEPHIN) 1 g in sodium chloride 0.9 % 100 mL IVPB    Order Specific Question:   Antibiotic Indication:    Answer:   Other Indication (list below)    Order Specific Question:   Other Indication:    Answer:   SBP prophylaxis    -I have reviewed the patients home medicines and have made adjustments as needed  Consultations Obtained: I requested consultation with the  gastroenterologist,  and discussed lab and imaging findings as well as pertinent plan - they recommend: as above   Cardiac Monitoring: The patient was maintained on a cardiac monitor.  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  Final Clinical Impression(s) / ED Diagnoses Final diagnoses:  Hematemesis without nausea  Epistaxis  Other ascites    Rx / DC Orders ED Discharge Orders     None  Evlyn Courier, PA-C 09/13/22 Artist Pais    Dorie Rank, MD 09/14/22 [phone]

## 2022-09-13 NOTE — Telephone Encounter (Signed)
Dr [name] I called and spoke to the pt and he tells me that he has bloody nose and vomiting.  He has a temp of 101 F since last night.  He tells me that he is headed to the ED for evaluation.

## 2022-09-13 NOTE — ED Triage Notes (Signed)
Fever , running nose, diarrhea , dry cough , body aches , flu 2 weeks ago .  Shortness of breath , denies chest pain .

## 2022-09-14 ENCOUNTER — Encounter (HOSPITAL_COMMUNITY): Payer: Self-pay | Admitting: Internal Medicine

## 2022-09-14 DIAGNOSIS — F1020 Alcohol dependence, uncomplicated: Secondary | ICD-10-CM | POA: Diagnosis present

## 2022-09-14 DIAGNOSIS — Z8249 Family history of ischemic heart disease and other diseases of the circulatory system: Secondary | ICD-10-CM | POA: Diagnosis not present

## 2022-09-14 DIAGNOSIS — K7031 Alcoholic cirrhosis of liver with ascites: Secondary | ICD-10-CM | POA: Diagnosis present

## 2022-09-14 DIAGNOSIS — J90 Pleural effusion, not elsewhere classified: Secondary | ICD-10-CM | POA: Diagnosis present

## 2022-09-14 DIAGNOSIS — R040 Epistaxis: Secondary | ICD-10-CM

## 2022-09-14 DIAGNOSIS — E872 Acidosis, unspecified: Secondary | ICD-10-CM | POA: Diagnosis present

## 2022-09-14 DIAGNOSIS — K920 Hematemesis: Secondary | ICD-10-CM

## 2022-09-14 DIAGNOSIS — E871 Hypo-osmolality and hyponatremia: Secondary | ICD-10-CM

## 2022-09-14 DIAGNOSIS — E876 Hypokalemia: Secondary | ICD-10-CM | POA: Diagnosis present

## 2022-09-14 DIAGNOSIS — Z8619 Personal history of other infectious and parasitic diseases: Secondary | ICD-10-CM | POA: Diagnosis not present

## 2022-09-14 DIAGNOSIS — I10 Essential (primary) hypertension: Secondary | ICD-10-CM

## 2022-09-14 DIAGNOSIS — K921 Melena: Secondary | ICD-10-CM | POA: Diagnosis present

## 2022-09-14 DIAGNOSIS — D696 Thrombocytopenia, unspecified: Secondary | ICD-10-CM

## 2022-09-14 DIAGNOSIS — K709 Alcoholic liver disease, unspecified: Secondary | ICD-10-CM

## 2022-09-14 DIAGNOSIS — K766 Portal hypertension: Secondary | ICD-10-CM | POA: Diagnosis present

## 2022-09-14 DIAGNOSIS — K2100 Gastro-esophageal reflux disease with esophagitis, without bleeding: Secondary | ICD-10-CM | POA: Diagnosis present

## 2022-09-14 DIAGNOSIS — R601 Generalized edema: Secondary | ICD-10-CM | POA: Diagnosis present

## 2022-09-14 DIAGNOSIS — D6959 Other secondary thrombocytopenia: Secondary | ICD-10-CM | POA: Diagnosis present

## 2022-09-14 DIAGNOSIS — D62 Acute posthemorrhagic anemia: Secondary | ICD-10-CM | POA: Diagnosis present

## 2022-09-14 DIAGNOSIS — D689 Coagulation defect, unspecified: Secondary | ICD-10-CM | POA: Diagnosis present

## 2022-09-14 DIAGNOSIS — F32A Depression, unspecified: Secondary | ICD-10-CM | POA: Diagnosis present

## 2022-09-14 DIAGNOSIS — K3189 Other diseases of stomach and duodenum: Secondary | ICD-10-CM | POA: Diagnosis present

## 2022-09-14 DIAGNOSIS — Z1152 Encounter for screening for COVID-19: Secondary | ICD-10-CM | POA: Diagnosis not present

## 2022-09-14 DIAGNOSIS — Z86718 Personal history of other venous thrombosis and embolism: Secondary | ICD-10-CM | POA: Diagnosis not present

## 2022-09-14 DIAGNOSIS — G721 Alcoholic myopathy: Secondary | ICD-10-CM | POA: Diagnosis present

## 2022-09-14 DIAGNOSIS — F17210 Nicotine dependence, cigarettes, uncomplicated: Secondary | ICD-10-CM | POA: Diagnosis present

## 2022-09-14 DIAGNOSIS — D649 Anemia, unspecified: Secondary | ICD-10-CM

## 2022-09-14 HISTORY — DX: Epistaxis: R04.0

## 2022-09-14 HISTORY — DX: Generalized edema: R60.1

## 2022-09-14 LAB — COMPREHENSIVE METABOLIC PANEL
ALT: 18 U/L (ref 0–44)
AST: 63 U/L — ABNORMAL HIGH (ref 15–41)
Albumin: 2.3 g/dL — ABNORMAL LOW (ref 3.5–5.0)
Alkaline Phosphatase: 51 U/L (ref 38–126)
Anion gap: 7 (ref 5–15)
BUN: 10 mg/dL (ref 6–20)
CO2: 21 mmol/L — ABNORMAL LOW (ref 22–32)
Calcium: 7.8 mg/dL — ABNORMAL LOW (ref 8.9–10.3)
Chloride: 106 mmol/L (ref 98–111)
Creatinine, Ser: 0.85 mg/dL (ref 0.61–1.24)
GFR, Estimated: >60 mL/min (ref >60)
Glucose, Bld: 93 mg/dL (ref 70–99)
Potassium: 3.2 mmol/L — ABNORMAL LOW (ref 3.5–5.1)
Sodium: 134 mmol/L — ABNORMAL LOW (ref 135–145)
Total Bilirubin: 2.8 mg/dL — ABNORMAL HIGH (ref 0.3–1.2)
Total Protein: 6 g/dL — ABNORMAL LOW (ref 6.5–8.1)

## 2022-09-14 LAB — URINALYSIS, ROUTINE W REFLEX MICROSCOPIC
Bilirubin Urine: NEGATIVE
Glucose, UA: NEGATIVE mg/dL
Ketones, ur: NEGATIVE mg/dL
Leukocytes,Ua: NEGATIVE
Nitrite: NEGATIVE
Protein, ur: NEGATIVE mg/dL
Specific Gravity, Urine: 1.008 (ref 1.005–1.030)
pH: 9 — ABNORMAL HIGH (ref 5.0–8.0)

## 2022-09-14 LAB — CBC WITH DIFFERENTIAL/PLATELET
Abs Immature Granulocytes: 0.02 10*3/uL (ref 0.00–0.07)
Basophils Absolute: 0.1 10*3/uL (ref 0.0–0.1)
Basophils Relative: 1 %
Eosinophils Absolute: 0.4 10*3/uL (ref 0.0–0.5)
Eosinophils Relative: 5 %
HCT: 22.4 % — ABNORMAL LOW (ref 39.0–52.0)
Hemoglobin: 7.1 g/dL — ABNORMAL LOW (ref 13.0–17.0)
Immature Granulocytes: 0 %
Lymphocytes Relative: 15 %
Lymphs Abs: 1.1 10*3/uL (ref 0.7–4.0)
MCH: 26.6 pg (ref 26.0–34.0)
MCHC: 31.7 g/dL (ref 30.0–36.0)
MCV: 83.9 fL (ref 80.0–100.0)
Monocytes Absolute: 1 10*3/uL (ref 0.1–1.0)
Monocytes Relative: 14 %
Neutro Abs: 4.8 10*3/uL (ref 1.7–7.7)
Neutrophils Relative %: 65 %
Platelets: 69 10*3/uL — ABNORMAL LOW (ref 150–400)
RBC: 2.67 MIL/uL — ABNORMAL LOW (ref 4.22–5.81)
RDW: 20.1 % — ABNORMAL HIGH (ref 11.5–15.5)
WBC: 7.4 10*3/uL (ref 4.0–10.5)
nRBC: 0 % (ref 0.0–0.2)

## 2022-09-14 LAB — MAGNESIUM: Magnesium: 1.4 mg/dL — ABNORMAL LOW (ref 1.7–2.4)

## 2022-09-14 LAB — HEMOGLOBIN AND HEMATOCRIT, BLOOD
HCT: 26.4 % — ABNORMAL LOW (ref 39.0–52.0)
HCT: 25.4 % — ABNORMAL LOW (ref 39.0–52.0)
Hemoglobin: 8.4 g/dL — ABNORMAL LOW (ref 13.0–17.0)
Hemoglobin: 8 g/dL — ABNORMAL LOW (ref 13.0–17.0)

## 2022-09-14 LAB — PREPARE RBC (CROSSMATCH)

## 2022-09-14 MED ORDER — ONDANSETRON HCL 4 MG/2ML IJ SOLN
4.0000 mg | Freq: Four times a day (QID) | INTRAMUSCULAR | Status: DC | PRN
  Administered 2022-09-14: 4 mg via INTRAVENOUS
  Filled 2022-09-14: qty 2

## 2022-09-14 MED ORDER — VORTIOXETINE HBR 5 MG PO TABS
10.0000 mg | ORAL_TABLET | Freq: Every day | ORAL | Status: DC
  Administered 2022-09-14 – 2022-09-15 (×2): 10 mg via ORAL
  Filled 2022-09-14 (×2): qty 2

## 2022-09-14 MED ORDER — SODIUM CHLORIDE 0.9 % IV SOLN: INTRAVENOUS | Status: AC

## 2022-09-14 MED ORDER — MAGNESIUM OXIDE -MG SUPPLEMENT 400 (240 MG) MG PO TABS
400.0000 mg | ORAL_TABLET | Freq: Two times a day (BID) | ORAL | Status: DC
  Administered 2022-09-14 – 2022-09-15 (×3): 400 mg via ORAL
  Filled 2022-09-14 (×3): qty 1

## 2022-09-14 MED ORDER — POTASSIUM CHLORIDE CRYS ER 20 MEQ PO TBCR
40.0000 meq | EXTENDED_RELEASE_TABLET | Freq: Once | ORAL | Status: AC
  Administered 2022-09-14: 40 meq via ORAL
  Filled 2022-09-14: qty 2

## 2022-09-14 MED ORDER — ACETAMINOPHEN 325 MG PO TABS
650.0000 mg | ORAL_TABLET | Freq: Once | ORAL | Status: AC
  Administered 2022-09-14: 650 mg via ORAL
  Filled 2022-09-14: qty 2

## 2022-09-14 MED ORDER — PHYTONADIONE 5 MG PO TABS
5.0000 mg | ORAL_TABLET | Freq: Every day | ORAL | Status: DC
  Administered 2022-09-14 – 2022-09-15 (×2): 5 mg via ORAL
  Filled 2022-09-14 (×2): qty 1

## 2022-09-14 MED ORDER — BUSPIRONE HCL 5 MG PO TABS
15.0000 mg | ORAL_TABLET | Freq: Two times a day (BID) | ORAL | Status: DC
  Administered 2022-09-14 – 2022-09-15 (×3): 15 mg via ORAL
  Filled 2022-09-14 (×3): qty 1

## 2022-09-14 MED ORDER — FUROSEMIDE 40 MG PO TABS
40.0000 mg | ORAL_TABLET | Freq: Every day | ORAL | Status: DC
  Administered 2022-09-14 – 2022-09-15 (×2): 40 mg via ORAL
  Filled 2022-09-14 (×2): qty 1

## 2022-09-14 MED ORDER — DIPHENHYDRAMINE HCL 25 MG PO CAPS
25.0000 mg | ORAL_CAPSULE | Freq: Once | ORAL | Status: AC
  Administered 2022-09-14: 25 mg via ORAL
  Filled 2022-09-14: qty 1

## 2022-09-14 MED ORDER — SODIUM CHLORIDE 0.9% IV SOLUTION: Freq: Once | INTRAVENOUS | Status: AC

## 2022-09-14 MED ORDER — ACETAMINOPHEN 650 MG RE SUPP: 650.0000 mg | Freq: Four times a day (QID) | RECTAL | Status: DC | PRN

## 2022-09-14 MED ORDER — ONDANSETRON HCL 4 MG PO TABS: 4.0000 mg | ORAL_TABLET | Freq: Four times a day (QID) | ORAL | Status: DC | PRN

## 2022-09-14 MED ORDER — POTASSIUM CHLORIDE 10 MEQ/100ML IV SOLN
10.0000 meq | INTRAVENOUS | Status: AC
  Administered 2022-09-14 (×3): 10 meq via INTRAVENOUS
  Filled 2022-09-14 (×3): qty 100

## 2022-09-14 MED ORDER — MAGNESIUM SULFATE 2 GM/50ML IV SOLN
2.0000 g | Freq: Once | INTRAVENOUS | Status: AC
  Administered 2022-09-14: 2 g via INTRAVENOUS
  Filled 2022-09-14: qty 50

## 2022-09-14 MED ORDER — PANTOPRAZOLE SODIUM 40 MG PO TBEC
40.0000 mg | DELAYED_RELEASE_TABLET | Freq: Every day | ORAL | Status: DC
  Administered 2022-09-14 – 2022-09-15 (×2): 40 mg via ORAL
  Filled 2022-09-14 (×2): qty 1

## 2022-09-14 MED ORDER — SPIRONOLACTONE 100 MG PO TABS
100.0000 mg | ORAL_TABLET | Freq: Every day | ORAL | Status: DC
  Administered 2022-09-14 – 2022-09-15 (×2): 100 mg via ORAL
  Filled 2022-09-14 (×2): qty 1

## 2022-09-14 MED ORDER — ACETAMINOPHEN 325 MG PO TABS: 650.0000 mg | ORAL_TABLET | Freq: Four times a day (QID) | ORAL | Status: DC | PRN

## 2022-09-14 NOTE — Assessment & Plan Note (Signed)
On lasix and aldactone for his ascites. As mentioned above, if BP can tolerate it, suggest coreg 3.125 mg bid to help with portal hypertension.

## 2022-09-14 NOTE — Assessment & Plan Note (Addendum)
Observation med/tele bed.  Repeat CBC.  Pt just had EGD last night that showed only mild esophagitis. Continue with IV protonix. No varices noted. Hopefully, pt's hematemesis is just blood he swallowed from his epistaxis. Keep him NPO in case he needs repeat EGD. Type and screen.

## 2022-09-14 NOTE — Assessment & Plan Note (Signed)
Likely due to lasix and aldactone. Was repleted with po and IV kcl. Repeat BMP. Check Mg.

## 2022-09-14 NOTE — Assessment & Plan Note (Signed)
Pt was sleeping when awake to sensation of bloody nose. Pt states he did swallow blood. No further epistaxis. Avoid asa, plavix, heparin and lovenox.

## 2022-09-14 NOTE — Plan of Care (Signed)

## 2022-09-14 NOTE — Assessment & Plan Note (Signed)
Stable. Plts 70K which is good for him. Continue with lasix and aldactone. He is not on any betablockers. Recent EGD on 07-2022 showed "moderate diffuse portal hypertensive gastropathy without bleeding or stigmata".  If BP can tolerate it, suggest coreg 3.125 mg bid to help with portal hypertension.

## 2022-09-14 NOTE — Assessment & Plan Note (Signed)
Stable. At 70K which is stable or slightly better than average for him.

## 2022-09-14 NOTE — H&P (Addendum)
History and Physical    Juan [name] ZOX:[id number] DOB: March 22, 1966 DOA: 09/13/2022  DOS: the patient was seen and examined on 09/13/2022  PCP: [name], DO   Patient coming from: Home  I have personally briefly reviewed patient's old medical records in [place]  CC: hematemesis HPI: 57 year old male with a history of alcoholic cirrhosis with ascites, chronic thrombocytopenia, hypertension, depression, anxiety, presents to the ER today with hematemesis.  Patient states that he woke up about 3 AM on 09/11/2002 with a bloody nose.  He then proceeded to vomit blood.  He started having diarrhea that afternoon.  It was not melanotic.  Patient states he had the flu 2 weeks ago.  He had a fever of 101 today at home  Arrival to the ER, temp 90.2 heart rate 86 blood pressure 09/19/1979 satting 99% on room air.  White count 6.1, hemoglobin 7.3, platelets of 70,000  Sodium 131, potassium 2.9, BUN of 11, creatinine 0.7 Hemoccult positive  Influenza A and B were negative, RSV negative, COVID-negative.  INR elevated 2.2.  Lactic acid 2.3.  [facility] hospital[facility] contacted for admission.   ED Course: HgB 7.3. lactic acid 2.3  Review of Systems:  Review of Systems  Constitutional: Negative.   HENT:  Positive for nosebleeds.   Eyes: Negative.   Respiratory: Negative.    Cardiovascular: Negative.   Gastrointestinal:  Positive for diarrhea.       +hematemesis  Genitourinary: Negative.   Musculoskeletal: Negative.   Skin: Negative.   Neurological: Negative.   Endo/Heme/Allergies: Negative.   Psychiatric/Behavioral: Negative.    All other systems reviewed and are negative.   Past Medical History:  Diagnosis Date   Alcohol abuse 01/11/2022   Alcohol addiction ([place])    Anxiety    Bleeding internal hemorrhoids 08/18/2022   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HCC) 81/19/1478   Chronic fatigue    Cirrhosis (HCC)    Colon polyps    Depression    GERD (gastroesophageal reflux  disease)    Hemochromatosis    Hiatal hernia 02/20/2022   Hx of blood clots    Leg   Hyperreflexia    Hypertension    PTSD (post-traumatic stress disorder)    Traumatic hemorrhagic shock [facility])     Past Surgical History:  Procedure Laterality Date   BIOPSY  09/24/2021   Procedure: BIOPSY;  Surgeon: Sharyn Creamer, MD;  Location: [place];  Service: Gastroenterology;;   Wilmon Pali RELEASE Bilateral    COLONOSCOPY WITH PROPOFOL N/A 09/24/2021   Procedure: COLONOSCOPY WITH PROPOFOL;  Surgeon: Sharyn Creamer, MD;  Location: [place];  Service: Gastroenterology;  Laterality: N/A;   ESOPHAGOGASTRODUODENOSCOPY (EGD) WITH PROPOFOL N/A 09/24/2021   Procedure: ESOPHAGOGASTRODUODENOSCOPY (EGD) WITH PROPOFOL;  Surgeon: Sharyn Creamer, MD;  Location: [place];  Service: Gastroenterology;  Laterality: N/A;   ESOPHAGOGASTRODUODENOSCOPY (EGD) WITH PROPOFOL N/A 08/18/2022   Procedure: ESOPHAGOGASTRODUODENOSCOPY (EGD) WITH PROPOFOL;  Surgeon: Irene Shipper, MD;  Location: [facility];  Service: Gastroenterology;  Laterality: N/A;   FLEXIBLE SIGMOIDOSCOPY N/A 08/18/2022   Procedure: FLEXIBLE SIGMOIDOSCOPY;  Surgeon: Irene Shipper, MD;  Location: [facility];  Service: Gastroenterology;  Laterality: N/A;   FRACTURE SURGERY     left ankle plate   HERNIA REPAIR     inguinal   KNEE SURGERY Right    x 4   SHOULDER SURGERY Bilateral    x 2   VASECTOMY       reports that he has been smoking cigarettes. His smokeless tobacco use  includes snuff. He reports that he does not currently use alcohol. He reports that he does not use drugs.  Allergies  Allergen Reactions   Apple Juice Swelling    Swelling  of tongue   Cucumber Extract Itching and Nausea And Vomiting    No extracts; just cucumber   Depakote Er [Divalproex Sodium Er] Swelling    Tongue swelling   Peanut Butter Flavor Anaphylaxis and Swelling   Peanut Oil Swelling   Peanut-Containing Drug Products Other (See Comments) and Swelling    Shellfish Allergy Itching and Swelling   Shrimp Extract Allergy Skin Test Itching and Swelling   Strawberry Extract Nausea And Vomiting and Swelling   Valproic Acid Anaphylaxis    depakote   Apple Swelling   Cantaloupe Extract Allergy Skin Test Rash   Codeine Itching and Rash    Patient reports he can take "CODONES" without problems   Lactose Other (See Comments)    Lactose intolerant - causes indigestion Stomach pain/gas   Vancomycin Rash    Family History  Problem Relation Age of Onset   Pulmonary fibrosis Mother    Hypertension Father    Other Father        liver failure   Diabetes Brother    Breast cancer Paternal Aunt    Colon cancer Neg Hx    Esophageal cancer Neg Hx    Stomach cancer Neg Hx    Rectal cancer Neg Hx     Prior to Admission medications   Medication Sig Start Date End Date Taking? Authorizing Provider  busPIRone (BUSPAR) 15 MG tablet Take 15 mg by mouth 2 (two) times daily.    [provider]  furosemide (LASIX) 40 MG tablet Take 1 tablet (40 mg total) by mouth daily. 09/07/22   Sharyn Creamer, MD  hydrocortisone (ANUSOL-HC) 25 MG suppository Place 1 suppository (25 mg total) rectally 2 (two) times daily. 08/18/22   Nita Sells, MD  lactulose, encephalopathy, (CHRONULAC) 10 GM/15ML SOLN Take 10 g by mouth daily as needed (constipation).    [provider]  LORazepam (ATIVAN) 2 MG tablet Take 2 mg by mouth every 6 (six) hours as needed for anxiety.    [provider]  Magnesium Chloride 64 MG TABS Take 1 tablet by mouth in the morning and at bedtime. 09/02/22   [name], DO  ondansetron (ZOFRAN-ODT) 8 MG disintegrating tablet Dissolve 1 tablet (8 mg total) by mouth every 8 (eight) hours as needed for nausea. 04/09/22   [name], DO  pantoprazole (PROTONIX) 40 MG tablet Take 1 tablet (40 mg total) by mouth daily. 07/20/22 07/20/23  Sharyn Creamer, MD  potassium chloride SA (KLOR-CON M) 20 MEQ tablet Take 1 tablet  (20 mEq total) by mouth 2 (two) times daily. 09/02/22 12/02/22  [name], DO  prochlorperazine (COMPAZINE) 10 MG tablet Take 1 tablet (10 mg total) by mouth every 6 (six) hours as needed for nausea or vomiting. 07/01/22   Silverio Decamp, MD  saccharomyces boulardii (FLORASTOR) 250 MG capsule Take 250 mg by mouth 2 (two) times daily.     [provider]  spironolactone (ALDACTONE) 100 MG tablet Take 1 tablet (100 mg total) by mouth daily. 09/07/22   Sharyn Creamer, MD  triamcinolone cream (KENALOG) 0.5 % Apply 1 Application topically 2 (two) times daily. To affected areas. 09/06/22   Silverio Decamp, MD  vortioxetine HBr (TRINTELLIX) 10 MG TABS tablet Take 1 tablet (10 mg total) by mouth daily. 09/02/21  Everrett Coombe, DO    Physical Exam: Vitals:   09/13/22 2230 09/13/22 2300 09/14/22 0017 09/14/22 0044  BP: 116/72 (!) 148/88 125/80   Pulse: 79  79   Resp: 19 20 18    Temp:   98.4 F (36.9 C)   TempSrc:   Oral   SpO2: 95%  99%   Weight:    83 kg  Height:    5\' 3"  (1.6 m)    Physical Exam Vitals and nursing note reviewed.  Constitutional:      General: He is not in acute distress.    Appearance: He is obese. He is not toxic-appearing or diaphoretic.  HENT:     Head: Normocephalic and atraumatic.     Nose: Nose normal.  Eyes:     General: No scleral icterus. Cardiovascular:     Rate and Rhythm: Normal rate and regular rhythm.     Pulses: Normal pulses.     Heart sounds: Murmur heard.  Pulmonary:     Effort: Pulmonary effort is normal. No respiratory distress.     Breath sounds: No wheezing.  Abdominal:     General: Bowel sounds are normal. There is no distension.     Palpations: Abdomen is soft. There is fluid wave.     Tenderness: There is no abdominal tenderness. There is no guarding or rebound.  Musculoskeletal:     Right lower leg: No edema.     Left lower leg: No edema.  Skin:    General: Skin is warm and dry.     Capillary Refill: Capillary  refill takes less than 2 seconds.  Neurological:     General: No focal deficit present.     Mental Status: He is alert and oriented to person, place, and time.      Labs on Admission: I have personally reviewed following labs and imaging studies  CBC: Recent Labs  Lab 09/13/22 1508  WBC 6.1  NEUTROABS 3.7  HGB 7.3*  HCT 23.1*  MCV 81.3  PLT 70*   Basic Metabolic Panel: Recent Labs  Lab 09/13/22 1508  NA 131*  K 2.9*  CL 104  CO2 20*  GLUCOSE 110*  BUN 11  CREATININE 0.75  CALCIUM 7.9*   GFR: Estimated Creatinine Clearance: 100.5 mL/min (by C-G formula based on SCr of 0.75 mg/dL). Liver Function Tests: Recent Labs  Lab 09/13/22 1508  AST 67*  ALT 20  ALKPHOS 59  BILITOT 3.1*  PROT 6.0*  ALBUMIN 2.3*   No results for input(s): "LIPASE", "AMYLASE" in the last 168 hours. No results for input(s): "AMMONIA" in the last 168 hours. Coagulation Profile: Recent Labs  Lab 09/13/22 1736  INR 2.2*   Cardiac Enzymes: No results for input(s): "CKTOTAL", "CKMB", "CKMBINDEX", "TROPONINI", "TROPONINIHS" in the last 168 hours. BNP (last 3 results) No results for input(s): "PROBNP" in the last 8760 hours. HbA1C: No results for input(s): "HGBA1C" in the last 72 hours. CBG: No results for input(s): "GLUCAP" in the last 168 hours. Lipid Profile: No results for input(s): "CHOL", "HDL", "LDLCALC", "TRIG", "CHOLHDL", "LDLDIRECT" in the last 72 hours. Thyroid Function Tests: No results for input(s): "TSH", "T4TOTAL", "FREET4", "T3FREE", "THYROIDAB" in the last 72 hours. Anemia Panel: No results for input(s): "VITAMINB12", "FOLATE", "FERRITIN", "TIBC", "IRON", "RETICCTPCT" in the last 72 hours. Urine analysis:    Component Value Date/Time   COLORURINE YELLOW 09/13/2022 1730   APPEARANCEUR CLEAR 09/13/2022 1730   LABSPEC 1.015 09/13/2022 1730   PHURINE 7.0 09/13/2022 1730   GLUCOSEU  NEGATIVE 09/13/2022 1730   HGBUR NEGATIVE 09/13/2022 1730   BILIRUBINUR NEGATIVE  09/13/2022 1730   BILIRUBINUR small (A) 04/17/2021 1646   KETONESUR NEGATIVE 09/13/2022 1730   PROTEINUR NEGATIVE 09/13/2022 1730   UROBILINOGEN 4.0 (A) 04/17/2021 1646   NITRITE NEGATIVE 09/13/2022 1730   LEUKOCYTESUR NEGATIVE 09/13/2022 1730    Radiological Exams on Admission: I have personally reviewed images DG Chest 2 View  Result Date: 09/13/2022 CLINICAL DATA:  Shortness of breath EXAM: CHEST - 2 VIEW COMPARISON:  05/05/2022 FINDINGS: The heart size and mediastinal contours are within normal limits. Low lung volumes. Small bilateral pleural effusions with bibasilar atelectasis. No pneumothorax. The visualized skeletal structures are unremarkable. IMPRESSION: Small bilateral pleural effusions with bibasilar atelectasis. Electronically Signed   By: Duanne Guess D.O.   On: 09/13/2022 15:39    EKG: My personal interpretation of EKG shows: no EKG to review  Assessment/Plan Principal Problem:   Hematemesis Active Problems:   Epistaxis   Hypokalemia   Chronic alcoholic liver disease (HCC)   Benign essential hypertension   Thrombocytopenia (HCC)   Cirrhosis of liver (HCC)   Hyponatremia   Normocytic anemia    Assessment and Plan: * Hematemesis Observation med/tele bed.  Repeat CBC.  Pt just had EGD last night that showed only mild esophagitis. Continue with IV protonix. No varices noted. Hopefully, pt's hematemesis is just blood he swallowed from his epistaxis. Keep him NPO in case he needs repeat EGD. Type and screen.  Epistaxis Pt was sleeping when awake to sensation of bloody nose. Pt states he did swallow blood. No further epistaxis. Avoid asa, plavix, heparin and lovenox.  Hypokalemia Likely due to lasix and aldactone. Was repleted with po and IV kcl. Repeat BMP. Check Mg.  Normocytic anemia Acutely worse. Likely due to epistaxis. Cannot rule out GI bleeding. He did have banding of internal hemorrhoids last week.  Hyponatremia Likely due to blood volume loss, lasix  and aldactone therapy. Monitor.  Cirrhosis of liver (HCC) Chronic. Has ascites but abd is soft. Doubt he needs paracentesis at this time.  Thrombocytopenia (HCC) Stable. At 70K which is stable or slightly better than average for him.  Benign essential hypertension On lasix and aldactone for his ascites. As mentioned above, if BP can tolerate it, suggest coreg 3.125 mg bid to help with portal hypertension.  Chronic alcoholic liver disease (HCC) Stable. Plts 70K which is good for him. Continue with lasix and aldactone. He is not on any betablockers. Recent EGD on 07-2022 showed "moderate diffuse portal hypertensive gastropathy without bleeding or stigmata".  If BP can tolerate it, suggest coreg 3.125 mg bid to help with portal hypertension.   DVT prophylaxis: SCDs Code Status: Full Code Family Communication: no family at bedside  Disposition Plan: return home  Consults called: EDP discussed with GI(Pyrtle)  Admission status: Observation, Telemetry bed   Carollee Herter, DO [facility] 09/14/2022, 3:39 AM

## 2022-09-14 NOTE — Hospital Course (Addendum)
57 year old man with history of alcoholic cirrhosis with ascites chronic thrombocytopenia hypertension depression anxiety presented with hematemesis after he woke up at 3 AM with bloody nose, he then proceeded to vomit blood, he started having diarrhea that afternoon not melanotic.  He has had flu 2 weeks ago and had fever of 101 at home In ED: HR86 blood pressure 09/19/1979 satting 99% on room air. Labs:White count 6.1, hemoglobin 7.3, platelets of 70,000,Sodium 131, potassium 2.9, BUN of 11, creatinine 0.7, Hemoccult positive Influenza A and B were negative, RSV negative, COVID-negative. INR elevated 2.2.Lactic acid 2.3.,  AST elevated 67 total bili 3.1 better same from recent lab from 1/9. Patient was admitted underwent blood transfusion with improvement and stabilization of hemoglobin.  He had coagulopathy given vitamin K. his platelets are improving at this time he remains stable tolerating diet, no abdominal pain fever or chills.  His MELD score is 22, he will continue with his Lasix Aldactone for his liver cirrhosis.  GI has signed off and will plan for outpatient follow-up with Dr. [name] and [facility].

## 2022-09-14 NOTE — Progress Notes (Signed)
Patient has bright red blood in urine this am. No pain with urination. Patient stated this is new onset. MD made aware.

## 2022-09-14 NOTE — Subjective & Objective (Addendum)
CC: hematemesis HPI: 57 year old male with a history of alcoholic cirrhosis with ascites, chronic thrombocytopenia, hypertension, depression, anxiety, presents to the ER today with hematemesis.  Patient states that he woke up about 3 AM on 09/11/2002 with a bloody nose.  He then proceeded to vomit blood.  He started having diarrhea that afternoon.  It was not melanotic.  Patient states he had the flu 2 weeks ago.  He had a fever of 101 today at home  Arrival to the ER, temp 90.2 heart rate 86 blood pressure 09/19/1979 satting 99% on room air.  White count 6.1, hemoglobin 7.3, platelets of 70,000  Sodium 131, potassium 2.9, BUN of 11, creatinine 0.7 Hemoccult positive  Influenza A and B were negative, RSV negative, COVID-negative.  INR elevated 2.2.  Lactic acid 2.3.  [facility] hospitalist contacted for admission.

## 2022-09-14 NOTE — Consult Note (Addendum)
Consultation  Referring Provider:   [facility] Primary Care Physician:  Everrett Coombe, DO Primary Gastroenterologist:  Dr. Leonides Schanz       Reason for Consultation:   Epistaxis, hematemesis, hematuria setting of ETOH cirrhosis/thrombocytopenia   Impression    Epistaxis, hematemesis, and hematuria in setting of cirrhosis Recent admission in December for bright red blood per rectum, repeated upper endoscopy at that time showed mild esophagitis, no varices, mild to moderate portal hypertensive gastropathy Hemoglobin relatively stable Bright red blood per rectum, HD stable, was at 7.8/8.3 down to 7.1 Patient has not been on PPI Patient is been having sinus congestion since influenza 2 weeks ago with recent epistaxis. No elevation of BUN to indicate upper GI bleed. FOBT positive, recent hemorrhoidal banding   Decompensated cirrhosis secondary to ETOH AST 63 ALT 18  Alkphos 51 TBili 2.8 INR 09/13/2022 2.2  MELD 3.0: 22 at 09/14/2022  5:38 AM MELD-Na: 21 at 09/14/2022  5:38 AM Calculated from: Serum Creatinine: 0.85 mg/dL (Using min of 1 mg/dL) at 9/50/9326  7:12 AM Serum Sodium: 134 mmol/L at 09/14/2022  5:38 AM Total Bilirubin: 2.8 mg/dL at 4/58/0998  3:38 AM Serum Albumin: 2.3 g/dL at 2/50/5397  6:73 AM INR(ratio): 2.2 at 09/13/2022  5:36 PM Age at listing (hypothetical): 56 years Sex: Male at 09/14/2022  5:38 AM - [facility] 03/01/2022 with CTA, AFP 06/2022 -follows with [facility]   with Coagulopathy INR 09/13/2022 2.2  Can consider vitamin K trial   Thrombocytopenia secondary to above Platelets 69   Ascites Has lower leg edema and ascites on exam Previous admission in dec with negative SBP Patient was on spironolactone outpatient but uncertain if he was taking, just received Lasix 40 mg but has not started.   Moderate AS with grade 2 diastolic dysfunction Echo 05/05/2022   Previous history of alcohol abuse Weekly sessions with counselor, AA and [facility].   Patient denies current alcohol use, discussed previous positive Peth at [facility], understands he will need serial negative prths in order to be eligible for liver transplant   LOS: 0 days     Plan   -Patient with recent EGD 08/17/2022 showed mild esophagitis, no varices and mild to moderate portal hypertensive, had internal hemorrhoids status post banding in the office 01/16.  No BUN elevation to indicate upper GI bleed, has not been on PPI, did have epi taxis and no hematuria.  -Consider endoscopy to evaluate esophagitis/varices but since been less than a month ago no gross bleeding with possible other explanation will discuss with Dr. Russella Dar if needed -PPI 40 mg twice daily -Spironolactone 100 mg and Lasix 40 mg -Monitor bowel movements, consider GI pathogen panel if continues -Low-sodium diet, less than 2 g daily -follow up [facility] outpatient - consider vitamin K trial for nutritional def - clear liquid diet  Thank you for your kind consultation.   Attending Physician Note   I have taken a history, reviewed the chart and examined the patient. I performed a substantive portion of this encounter, including complete performance of at least one of the key components, in conjunction with the APP. I agree with the APP's note, impression and recommendations with my edits. My additional impressions and recommendations are as follows.   Hematemesis due to epistaxis. EGD 12/27: mild esophagitis, no varices, mild to moderate portal hypertensive gastropathy. No plans to repeat EGD at this time. Begin pantoprazole 40 mg qd for esophagitis. Advance diet.   Small volume hematochezia,  heme + stool likely due to hemorrhoids which were recently banded. Flex sigmoidoscopy 12/27: moderate internal hemorrhoids. No further evaluation at this time. Repeat outpatient hemorrhoid banding as planned.   Chronic anemia, thrombocytopenia  Decompensated cirrhosis with ascites, coagulopathy, portal  gastropathy. MELD 3.0=22. Furosemide 40 mg qd, spironolactone 100 mg qd.   GI signing off. Outpatient GI follow up with Dr. [name] and [facility].    Lucio Edward, MD [facility] See Shea Evans, [facility], for our on call provider             HPI:   Juan [name] is a 57 y.o. 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    09/24/2021 during an admission for influenza B with anemia which showed grade C esophagitis, a medium size hiatal hernia, portal hypertensive gastropathy.  09/24/2021 EGD colonoscopy identified nonbleeding internal hemorrhoids.  10/23/2021 repeat EGD was done which showed resolution of his esophagitis.  06/24/2022 positive PETH test [facility], not liver transplant candidate until serial negative PETH testing.  08/17/2022 hospital admission for bright red blood per rectum in setting of thrombocytopenia alcoholic cirrhosis.  Hemoglobin admitting 5.2 status post 2 units PRBC 7.5  Repeated EGD and flex sigmoidoscopy mild esophagitis, no varices, mild to moderate portal hypertensive gastropathy fleck sigmoidoscopy showed moderate internal hemorrhoids and area of recent bleeding identified.   08/17/2022 CT a showed bilateral pleural effusions, small gallstones, no inflammation, moderate volume ascites, normal spleen, normal kidneys and bladder, no active bleeding. 08/17/2022 paracentesis 1 L no SBP 09/07/2022 hemorrhoidal banding with Dr. [name] outpatient setting.  At that visit patient had significant  worsening bilateral extremity edema, increased spironolactone 100 mg daily and Lasix 40 mg daily.  Negative bilateral lower extremity ultrasound.  1/20 patient woke up 3 AM with epistaxis and proceeded to have hematemesis.  Patient states he had influenza 2 weeks ago was on Tamiflu, no NSAIDs or prednisone. Has continued to have nasal congestion and started to have some nosebleeds woke up to 1:03 AM last night with nausea afterwards and hematemesis small-volume bright red blood.  No coffee-ground emesis. Patient's been having loose stools 2 since last night had 4 BMs yesterday and 2 bowel movements this morning denies melena or hematochezia. Takes Tums as needed states needs 1 now has not been on proton pump inhibitor outpatient. Patient denies shortness of breath, dysphagia, odynophagia, abdominal pain. Continues to have abdominal and leg swelling. States had some fever at home when he had the flu, continues with chills. Denies any alcohol or NSAIDs. States this morning was running to the restroom to urinate was hooked up to cord so he squeezed his penis very hard to try to prevent incontinence and had gross hematuria afterwards.  Is never had before.  No back pain.   In the ER afebrile, no HD instability. Labs in the ER showed WBC 6.1 Hgb 7.3 with baseline around 8, platelets 70,000, sodium 133, potassium 2.9, BUN 11, creatinine 0.7, Hemoccult positive. Respiratory panel negative INR elevated 2.2, lactic acid 2.3  Abnormal ED labs: Abnormal Labs Reviewed  LACTIC ACID, PLASMA - Abnormal; Notable for the following components:      Result Value  Lactic Acid, Venous 2.4 (*)    All other components within normal limits  LACTIC ACID, PLASMA - Abnormal; Notable for the following components:   Lactic Acid, Venous 2.3 (*)    All other components within normal limits  COMPREHENSIVE METABOLIC PANEL - Abnormal; Notable for the following components:   Sodium 131 (*)    Potassium 2.9 (*)    CO2 20  (*)    Glucose, Bld 110 (*)    Calcium 7.9 (*)    Total Protein 6.0 (*)    Albumin 2.3 (*)    AST 67 (*)    Total Bilirubin 3.1 (*)    All other components within normal limits  CBC WITH DIFFERENTIAL/PLATELET - Abnormal; Notable for the following components:   RBC 2.84 (*)    Hemoglobin 7.3 (*)    HCT 23.1 (*)    MCH 25.7 (*)    RDW 19.9 (*)    Platelets 70 (*)    All other components within normal limits  OCCULT BLOOD X 1 CARD TO LAB, STOOL - Abnormal; Notable for the following components:   Fecal Occult Bld POSITIVE (*)    All other components within normal limits  PROTIME-INR - Abnormal; Notable for the following components:   Prothrombin Time 24.1 (*)    INR 2.2 (*)    All other components within normal limits  COMPREHENSIVE METABOLIC PANEL - Abnormal; Notable for the following components:   Sodium 134 (*)    Potassium 3.2 (*)    CO2 21 (*)    Calcium 7.8 (*)    Total Protein 6.0 (*)    Albumin 2.3 (*)    AST 63 (*)    Total Bilirubin 2.8 (*)    All other components within normal limits  CBC WITH DIFFERENTIAL/PLATELET - Abnormal; Notable for the following components:   RBC 2.67 (*)    Hemoglobin 7.1 (*)    HCT 22.4 (*)    RDW 20.1 (*)    Platelets 69 (*)    All other components within normal limits  MAGNESIUM - Abnormal; Notable for the following components:   Magnesium 1.4 (*)    All other components within normal limits     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   Chronic fatigue    Cirrhosis (HCC)    Colon polyps    Depression    GERD (gastroesophageal reflux disease)    Hemochromatosis    Hiatal hernia 02/20/2022   Hx of blood clots    Leg   Hyperreflexia    Hypertension    PTSD (post-traumatic stress disorder)    Traumatic hemorrhagic shock (HCC)     Surgical History:  He  has a past  surgical history that includes Hernia repair; Vasectomy; Fracture surgery; Knee surgery (Right); Shoulder surgery (Bilateral); Carpal tunnel release (Bilateral); Esophagogastroduodenoscopy (egd) with propofol (N/A, 09/24/2021); Colonoscopy with propofol (N/A, 09/24/2021); biopsy (09/24/2021); Esophagogastroduodenoscopy (egd) with propofol (N/A, 08/18/2022); and Flexible sigmoidoscopy (N/A, 08/18/2022). Family History:  His family history includes Breast cancer in his paternal aunt; Diabetes in his brother; Hypertension in his father; Other in his father; Pulmonary fibrosis in his mother. Social History:   reports that he has been smoking cigarettes. His smokeless tobacco use includes snuff. He reports that he does not currently use alcohol. He reports that he does not use drugs.  Prior to Admission medications   Medication Sig Start Date End Date Taking? Authorizing Provider  busPIRone (BUSPAR) 15 MG tablet Take 15 mg by mouth 2 (two) times daily.    [provider]  furosemide (LASIX) 40 MG tablet Take 1 tablet (40 mg total) by mouth daily. 09/07/22   Imogene Burnorsey, Ying C, MD  hydrocortisone (ANUSOL-HC) 25 MG suppository Place 1 suppository (25 mg total) rectally 2 (two) times daily. 08/18/22   Rhetta MuraSamtani, Jai-Gurmukh, MD  lactulose, encephalopathy, (CHRONULAC) 10 GM/15ML SOLN Take 10 g by mouth daily as needed (constipation).    [provider]  LORazepam (ATIVAN) 2 MG tablet Take 2 mg by mouth every 6 (six) hours as needed for anxiety.    [provider]  Magnesium Chloride 64 MG TABS Take 1 tablet by mouth in the morning and at bedtime. 09/02/22   Everrett CoombeMatthews, Cody, DO  ondansetron (ZOFRAN-ODT) 8 MG disintegrating tablet Dissolve 1 tablet (8 mg total) by mouth every 8 (eight) hours as needed for nausea. 04/09/22   Everrett CoombeMatthews, Cody, DO  pantoprazole (PROTONIX) 40 MG tablet Take 1 tablet (40 mg total) by mouth daily. 07/20/22 07/20/23  Imogene Burnorsey, Ying C, MD  potassium chloride SA (KLOR-CON M) 20  MEQ tablet Take 1 tablet (20 mEq total) by mouth 2 (two) times daily. 09/02/22 12/02/22  Everrett CoombeMatthews, Cody, DO  prochlorperazine (COMPAZINE) 10 MG tablet Take 1 tablet (10 mg total) by mouth every 6 (six) hours as needed for nausea or vomiting. 07/01/22   Monica Bectonhekkekandam, Thomas J, MD  saccharomyces boulardii (FLORASTOR) 250 MG capsule Take 250 mg by mouth 2 (two) times daily.     [provider]  spironolactone (ALDACTONE) 100 MG tablet Take 1 tablet (100 mg total) by mouth daily. 09/07/22   Imogene Burnorsey, Ying C, MD  triamcinolone cream (KENALOG) 0.5 % Apply 1 Application topically 2 (two) times daily. To affected areas. 09/06/22   Monica Bectonhekkekandam, Thomas J, MD  vortioxetine HBr (TRINTELLIX) 10 MG TABS tablet Take 1 tablet (10 mg total) by mouth daily. 09/02/21   Everrett CoombeMatthews, Cody, DO    Current Facility-Administered Medications  Medication Dose Route Frequency Provider Last Rate Last Admin   0.9 %  sodium chloride infusion   Intravenous Continuous Carollee HerterChen, Eric, DO 75 mL/hr at 09/14/22 0523 Infusion Verify at 09/14/22 0523   acetaminophen (TYLENOL) tablet 650 mg  650 mg Oral Q6H PRN Carollee Herterhen, Eric, DO       Or   acetaminophen (TYLENOL) suppository 650 mg  650 mg Rectal Q6H PRN Carollee Herterhen, Eric, DO       busPIRone (BUSPAR) tablet 15 mg  15 mg Oral BID Carollee Herterhen, Eric, DO   15 mg at 09/14/22 0357   furosemide (LASIX) tablet 40 mg  40 mg Oral Daily Carollee Herterhen, Eric, DO       ondansetron [facility](ZOFRAN) tablet 4 mg  4 mg Oral Q6H PRN Carollee Herterhen, Eric, DO       Or   ondansetron [facility](ZOFRAN) injection 4 mg  4 mg Intravenous Q6H PRN Carollee Herterhen, Eric, DO   4 mg at 09/14/22 0357   pantoprazole (PROTONIX) EC tablet 40 mg  40 mg Oral Daily Carollee Herterhen, Eric, DO       spironolactone (ALDACTONE) tablet 100 mg  100 mg Oral Daily Carollee Herterhen, Eric, DO       vortioxetine HBr (TRINTELLIX) tablet 10 mg  10 mg Oral Daily Carollee Herterhen, Eric, DO        Allergies as of 09/13/2022 - Review Complete 09/13/2022  Allergen Reaction Noted  Apple juice Swelling 05/07/2021   Cucumber extract  Itching and Nausea And Vomiting 10/16/2013   Depakote er [divalproex sodium er] Swelling 11/10/2017   Peanut butter flavor Anaphylaxis and Swelling 10/16/2013   Peanut oil Swelling 10/16/2013   Peanut-containing drug products Other (See Comments) and Swelling 10/16/2013   Shellfish allergy Itching and Swelling 10/16/2013   Shrimp extract allergy skin test Itching and Swelling 10/16/2013   Strawberry extract Nausea And Vomiting and Swelling 10/16/2013   Valproic acid Anaphylaxis 10/16/2013   Apple Swelling 05/04/2022   Cantaloupe extract allergy skin test Rash 12/30/2017   Codeine Itching and Rash 10/16/2013   Lactose Other (See Comments) 12/30/2017   Vancomycin Rash 12/29/2018    Review of Systems:    Constitutional: No weight loss, fever, chills, weakness or fatigue HEENT: Eyes: No change in vision               Ears, Nose, Throat:  No change in hearing or congestion Skin: No rash or itching Cardiovascular: No chest pain, chest pressure or palpitations   Respiratory: No SOB or cough Gastrointestinal: See HPI and otherwise negative Genitourinary: No dysuria or change in urinary frequency Neurological: No headache, dizziness or syncope Musculoskeletal: No new muscle or joint pain Hematologic: No bleeding or bruising Psychiatric: No history of depression or anxiety     Physical Exam:  Vital signs in last 24 hours: Temp:  [98.2 F (36.8 C)-99.9 F (37.7 C)] 99.9 F (37.7 C) (01/23 0452) Pulse Rate:  [77-95] 89 (01/23 0452) Resp:  [13-31] 20 (01/23 0452) BP: (108-148)/(70-99) 109/80 (01/23 0452) SpO2:  [92 %-100 %] 97 % (01/23 0452) Weight:  [82.9 kg-84 kg] 82.9 kg (01/23 0500) Last BM Date : 09/14/22 Last BM recorded by nurses in past 5 days No data recorded  General :  Alert, chronically ill-appearing male in no acute distress Head:  Normocephalic and atraumatic. Eyes :  scleral icterus,conjunctive icteric  Heart:  regular rate and rhythm, significant systolic murmur  right sternal border Pulm:  Clear anteriorly; no wheezing Abdomen:   Soft, Obese protuberant AB, skin exam normal, Normal bowel sounds.no tenderness,. Without guarding and Without rebound, without hepatomegaly. + shifting dullness.  Extremities:   With 2-3+ edema. Msk:  Symmetrical without gross deformities. Peripheral pulses intact.  Neurologic: Alert and  oriented x4;  grossly normal neurologically. without asterixis or clonus.  Skin:   without jaundice. no palmar erythema or spider angioma.   Psychiatric:  Demonstrates good judgement and reason without abnormal affect or behaviors.  LAB RESULTS: Recent Labs    09/13/22 1508 09/14/22 0538  WBC 6.1 7.4  HGB 7.3* 7.1*  HCT 23.1* 22.4*  PLT 70* 69*   BMET Recent Labs    09/13/22 1508 09/14/22 0538  NA 131* 134*  K 2.9* 3.2*  CL 104 106  CO2 20* 21*  GLUCOSE 110* 93  BUN 11 10  CREATININE 0.75 0.85  CALCIUM 7.9* 7.8*   LFT Recent Labs    09/14/22 0538  PROT 6.0*  ALBUMIN 2.3*  AST 63*  ALT 18  ALKPHOS 51  BILITOT 2.8*   PT/INR Recent Labs    09/13/22 1736  LABPROT 24.1*  INR 2.2*    STUDIES: DG Chest 2 View  Result Date: 09/13/2022 CLINICAL DATA:  Shortness of breath EXAM: CHEST - 2 VIEW COMPARISON:  05/05/2022 FINDINGS: The heart size and mediastinal contours are within normal limits. Low lung volumes. Small bilateral pleural effusions with bibasilar atelectasis. No pneumothorax. The visualized skeletal structures are  unremarkable. IMPRESSION: Small bilateral pleural effusions with bibasilar atelectasis. Electronically Signed   By: Davina Poke D.O.   On: 09/13/2022 15:39     Vladimir Crofts  09/14/2022, 8:10 AM

## 2022-09-14 NOTE — Assessment & Plan Note (Signed)
Acutely worse. Likely due to epistaxis. Cannot rule out GI bleeding. He did have banding of internal hemorrhoids last week.

## 2022-09-14 NOTE — Assessment & Plan Note (Signed)
Likely due to blood volume loss, lasix and aldactone therapy. Monitor.

## 2022-09-14 NOTE — Progress Notes (Signed)
Patient urine has turned dark red, MD made aware.

## 2022-09-14 NOTE — Progress Notes (Signed)
Patient seen and examined personally, I reviewed the chart, history and physical and admission note, done by admitting physician this morning and agree with the same with following addendum.  Please refer to the morning admission note for more detailed plan of care.  Briefly,  57 year old man with history of alcoholic cirrhosis with ascites chronic thrombocytopenia hypertension depression anxiety presented with hematemesis after he woke up at 3 AM with bloody nose, he then proceeded to vomit blood, he started having diarrhea that afternoon not melanotic.  He has had flu 2 weeks ago and had fever of 101 at home In ED: HR86 blood pressure 09/19/1979 satting 99% on room air. Labs:White count 6.1, hemoglobin 7.3, platelets of 70,000,Sodium 131, potassium 2.9, BUN of 11, creatinine 0.7, Hemoccult positive Influenza A and B were negative, RSV negative, COVID-negative. INR elevated 2.2.Lactic acid 2.3.,  AST elevated 67 total bili 3.1 better same from recent lab from 1/9.      Patient this am reports his hematuria is resolving he stated he was squeezing his penis on the way to the restroom so that he would not have accident and that because him to have hematuria. No more hematemesis epistaxis since admission  Issues: Hematemesis/Epistaxis/Hematuria:  Likely coagulopathy and thrombocytopenia contributing. Recent EGD flex sigmoidoscopy on 12/27/2023Mildly mild esophagitis no varices mild to moderate portal hypertensive gastropathy was noted.  Hemoccult was positive with that will involve GI, he did have banding of internal hemorrhoids in December. monitor H&H and transfuse. Avoid heparin aspirin Plavix heparin Lovenox, add afrin drops.He had hematuria this am- check UA.  Likely from trauma   Coagulopathy with INR 2.2 in the setting of liver cirrhosis will start vitamin K Recent Labs  Lab 09/13/22 1736  INR 2.2*    Hypokalemia will replete and recheck Recent Labs  Lab 09/13/22 1508 09/14/22 0538  K  2.9* 3.2*    Normocytic anemia Acute blood loss anemia: Hemoglobin downtrending will transfuse 1 unit PRBC today-discussed risks benefits alternatives and would like to proceed with 1 unit PRBC transfusion. Recent Labs  Lab 09/13/22 1508 09/14/22 0538  HGB 7.3* 7.1*  HCT 23.1* 22.4*    Essential hypertension on Lasix Aldactone, monitor beta-blocker as outpatient for portal hypertension Moderate AS with grade 2 diastolic dysfunction on echo 05/05/2022.  Stable  Decompensated liver cirrhosis with ascites/anasarca leg edema Chronic alcoholic liver cirrhosis with coagulopathy/ thrombocytopenia/ portal hypertension:  With abnormal LFTs.  Continue home meds.  Alcohol cessation encouraged.  Last paracentesis on 12/26 with 1 L fluid removal Recent Labs  Lab 09/13/22 1508 09/13/22 1736 09/14/22 0538  AST 67*  --  63*  ALT 20  --  18  ALKPHOS 59  --  51  BILITOT 3.1*  --  2.8*  PROT 6.0*  --  6.0*  ALBUMIN 2.3*  --  2.3*  INR  --  2.2*  --   PLT 70*  --  69*      Latest Ref Rng & Units 08/14/2020    3:58 AM  Hepatitis  Hep B Surface Ag NON REACTIVE NON REACTIVE   Hep B IgM NON REACTIVE NON REACTIVE   Hep C Ab NON REACTIVE NON REACTIVE   Hep A IgM NON REACTIVE NON REACTIVE    MELD 3.0: 22 at 09/14/2022  5:38 AM MELD-Na: 21 at 09/14/2022  5:38 AM Calculated from: Serum Creatinine: 0.85 mg/dL (Using min of 1 mg/dL) at 09/14/2022  5:38 AM Serum Sodium: 134 mmol/L at 09/14/2022  5:38 AM Total Bilirubin: 2.8 mg/dL  at 09/14/2022  5:38 AM Serum Albumin: 2.3 g/dL at 09/14/2022  5:38 AM INR(ratio): 2.2 at 09/13/2022  5:36 PM Age at listing (hypothetical): 67 years Sex: Male at 09/14/2022  5:38 AM

## 2022-09-14 NOTE — Assessment & Plan Note (Signed)
Chronic. Has ascites but abd is soft. Doubt he needs paracentesis at this time.

## 2022-09-15 ENCOUNTER — Other Ambulatory Visit (HOSPITAL_BASED_OUTPATIENT_CLINIC_OR_DEPARTMENT_OTHER): Payer: Self-pay

## 2022-09-15 DIAGNOSIS — K920 Hematemesis: Secondary | ICD-10-CM | POA: Diagnosis not present

## 2022-09-15 LAB — TYPE AND SCREEN
ABO/RH(D): B POS
Antibody Screen: NEGATIVE
Unit division: 0

## 2022-09-15 LAB — BPAM RBC
Blood Product Expiration Date: 202401272359
ISSUE DATE / TIME: 202401231350
Unit Type and Rh: 7300

## 2022-09-15 LAB — PROTIME-INR
INR: 2.1 — ABNORMAL HIGH (ref 0.8–1.2)
Prothrombin Time: 23.4 seconds — ABNORMAL HIGH (ref 11.4–15.2)

## 2022-09-15 LAB — CBC
HCT: 27.3 % — ABNORMAL LOW (ref 39.0–52.0)
Hemoglobin: 8.5 g/dL — ABNORMAL LOW (ref 13.0–17.0)
MCH: 26.6 pg (ref 26.0–34.0)
MCHC: 31.1 g/dL (ref 30.0–36.0)
MCV: 85.3 fL (ref 80.0–100.0)
Platelets: 75 10*3/uL — ABNORMAL LOW (ref 150–400)
RBC: 3.2 MIL/uL — ABNORMAL LOW (ref 4.22–5.81)
RDW: 20.2 % — ABNORMAL HIGH (ref 11.5–15.5)
WBC: 6.6 10*3/uL (ref 4.0–10.5)
nRBC: 0 % (ref 0.0–0.2)

## 2022-09-15 LAB — BASIC METABOLIC PANEL
Anion gap: 7 (ref 5–15)
BUN: 9 mg/dL (ref 6–20)
CO2: 21 mmol/L — ABNORMAL LOW (ref 22–32)
Calcium: 7.6 mg/dL — ABNORMAL LOW (ref 8.9–10.3)
Chloride: 107 mmol/L (ref 98–111)
Creatinine, Ser: 0.76 mg/dL (ref 0.61–1.24)
GFR, Estimated: >60 mL/min (ref >60)
Glucose, Bld: 90 mg/dL (ref 70–99)
Potassium: 3.5 mmol/L (ref 3.5–5.1)
Sodium: 135 mmol/L (ref 135–145)

## 2022-09-15 LAB — MAGNESIUM: Magnesium: 1.6 mg/dL — ABNORMAL LOW (ref 1.7–2.4)

## 2022-09-15 MED ORDER — OXYCODONE HCL 5 MG PO TABS
2.5000 mg | ORAL_TABLET | Freq: Once | ORAL | Status: AC
  Administered 2022-09-15: 2.5 mg via ORAL
  Filled 2022-09-15: qty 1

## 2022-09-15 MED ORDER — SPIRONOLACTONE 100 MG PO TABS
100.0000 mg | ORAL_TABLET | Freq: Every day | ORAL | 0 refills | Status: DC
  Filled 2022-09-15 – 2022-11-16 (×2): qty 30, 30d supply, fill #0

## 2022-09-15 MED ORDER — PHYTONADIONE 5 MG PO TABS
5.0000 mg | ORAL_TABLET | Freq: Every day | ORAL | 0 refills | Status: AC
  Filled 2022-09-15 – 2022-09-16 (×2): qty 5, 5d supply, fill #0

## 2022-09-15 MED ORDER — MAGNESIUM SULFATE 2 GM/50ML IV SOLN
2.0000 g | Freq: Once | INTRAVENOUS | Status: AC
  Administered 2022-09-15: 2 g via INTRAVENOUS
  Filled 2022-09-15: qty 50

## 2022-09-15 MED ORDER — FUROSEMIDE 40 MG PO TABS
40.0000 mg | ORAL_TABLET | Freq: Every day | ORAL | 0 refills | Status: DC
  Filled 2022-09-15: qty 30, 30d supply, fill #0

## 2022-09-15 NOTE — Progress Notes (Signed)
  Transition of Care [facility]) Screening Note   Patient Details  Name: Juan [name] Date of Birth: [birth year]   [facility]) CM/SW Contact:    Vassie Moselle, LCSW Phone Number: 09/15/2022, 9:30 AM    Transition of Care Department [facility]) has reviewed patient and no TOC needs have been identified at this time. We will continue to monitor patient advancement through interdisciplinary progression rounds. If new patient transition needs arise, please place a TOC consult.

## 2022-09-15 NOTE — TOC Transition Note (Signed)
Transition of Care [facility]) - CM/SW Discharge Note   Patient Details  Name: Kainon Varady MRN: [id number] Date of Birth: 23-Jun-1966  Transition of Care [facility]) CM/SW Contact:  Vassie Moselle, LCSW Phone Number: 09/15/2022, 12:45 PM   Clinical Narrative:    Caryl Pina with [facility] reached out to [place] as pt is active for HHPT with their agency. ROC orders placed for pt to continue to receive [place] at discharge.    Final next level of care: [place] Barriers to Discharge: No Barriers Identified   Patient Goals and CMS Choice      Discharge Placement                         Discharge Plan and Services Additional resources added to the After Visit Summary for                                       Social Determinants of Health (SDOH) Interventions SDOH Screenings   Food Insecurity: No Food Insecurity (08/17/2022)  Housing: Low Risk  (08/17/2022)  Transportation Needs: No Transportation Needs (08/17/2022)  Utilities: Not At Risk (08/17/2022)  Depression (PHQ2-9): Low Risk  (08/31/2022)  Tobacco Use: High Risk (09/14/2022)     Readmission Risk Interventions    09/15/2022    9:29 AM 03/04/2022    8:29 AM  Readmission Risk Prevention Plan  Transportation Screening Complete Complete  PCP or Specialist Appt within 5-7 Days  Complete  PCP or Specialist Appt within 3-5 Days Complete   Home Care Screening  Complete  Medication Review (RN CM)  Complete  HRI or Home Care Consult Complete   Social Work Consult for [place] Planning/Counseling Complete   Palliative Care Screening Not Applicable   Medication Review Press photographer) Complete

## 2022-09-15 NOTE — Discharge Summary (Signed)
Physician Discharge Summary  Lauro Manlove XNA:[id number] DOB: 10/06/1965 DOA: 09/13/2022  PCP: Everrett Coombe, DO  Admit date: 09/13/2022 Discharge date: 09/15/2022 Recommendations for Outpatient Follow-up:  Follow up with PCP in 1 weeks-call for appointment Please obtain BMP/CBC in one week  Discharge Dispo: Home Discharge Condition: Stable Code Status:   Code Status: Full Code Diet recommendation:  Diet Order             Diet 2 gram sodium Room service appropriate? Yes; Fluid consistency: Thin  Diet effective now                    Brief/Interim Summary: 57 year old man with history of alcoholic cirrhosis with ascites chronic thrombocytopenia hypertension depression anxiety presented with hematemesis after he woke up at 3 AM with bloody nose, he then proceeded to vomit blood, he started having diarrhea that afternoon not melanotic.  He has had flu 2 weeks ago and had fever of 101 at home In ED: HR86 blood pressure 09/19/1979 satting 99% on room air. Labs:White count 6.1, hemoglobin 7.3, platelets of 70,000,Sodium 131, potassium 2.9, BUN of 11, creatinine 0.7, Hemoccult positive Influenza A and B were negative, RSV negative, COVID-negative. INR elevated 2.2.Lactic acid 2.3.,  AST elevated 67 total bili 3.1 better same from recent lab from 1/9. Patient was admitted underwent blood transfusion with improvement and stabilization of hemoglobin.  He had coagulopathy given vitamin K. his platelets are improving at this time he remains stable tolerating diet, no abdominal pain fever or chills.  His MELD score is 22, he will continue with his Lasix Aldactone for his liver cirrhosis.  GI has signed off and will plan for outpatient follow-up with Dr. Leonides Schanz and [facility].     Discharge Diagnoses:  Principal Problem:   Hematemesis Active Problems:   Epistaxis   Hypokalemia   Chronic alcoholic liver disease (HCC)   Benign essential hypertension   Thrombocytopenia (HCC)    Cirrhosis of liver (HCC)   Hyponatremia   Normocytic anemia   Anasarca  Hematemesis/Epistaxis/Hematuria:  Likely coagulopathy and thrombocytopenia contributing. Recent EGD flex sigmoidoscopy on 12/27/2023Mildly mild esophagitis no varices mild to moderate portal hypertensive gastropathy was noted.  Hemoccult was positive > GI seen the patient at this time no plan for endoscopy evaluation, manage symptomatically continue Protonix.  He had hematuria from his traumatic holding of his penis and now resolving.  Platelets slowly coming up.  He is coagulopathic with INR down to 2.1, continue vitamin K.   Coagulopathy with INR 2.2 w/ vitamin k at 2.1 cont vitamin k Recent Labs  Lab 09/13/22 1736 09/15/22 1040  INR 2.2* 2.1*    Hypokalemia resolved  Hypomagnesemia repleted,continue Mag-Ox    Normocytic anemia Acute blood loss anemia: Hemoglobin stabilized after transfusion, outpatient follow-up with PCP and CBC in a week  Recent Labs  Lab 09/13/22 1508 09/14/22 0538 09/14/22 1833 09/14/22 2103 09/15/22 0841  HGB 7.3* 7.1* 8.4* 8.0* 8.5*  HCT 23.1* 22.4* 26.4* 25.4* 27.3*    Essential hypertension on Lasix Aldactone.  Stable Moderate AS with grade 2 diastolic dysfunction on echo 05/05/2022.  Stable  Decompensated liver cirrhosis with ascites/anasarca leg edema Chronic alcoholic liver cirrhosis with coagulopathy/ thrombocytopenia/ portal hypertension:  With abnormal LFTs.  Continue home meds.  Alcohol cessation encouraged.  Last paracentesis on 12/26 with 1 L fluid removal Recent Labs  Lab 09/13/22 1508 09/13/22 1736 09/14/22 0538 09/15/22 0841 09/15/22 1040  AST 67*  --  63*  --   --  ALT 20  --  18  --   --   ALKPHOS 59  --  51  --   --   BILITOT 3.1*  --  2.8*  --   --   PROT 6.0*  --  6.0*  --   --   ALBUMIN 2.3*  --  2.3*  --   --   INR  --  2.2*  --   --  2.1*  PLT 70*  --  69* 75*  --      Consults: Gastroenterology  Subjective: Alert awake oriented resting  comfortably eager to go home today  Discharge Exam: Vitals:   09/14/22 1735 09/15/22 0610  BP: 105/66 105/76  Pulse: 79 74  Resp:  20  Temp: 97.6 F (36.4 C) 97.8 F (36.6 C)  SpO2: 98% 94%   General: Pt is alert, awake, not in acute distress Cardiovascular: RRR, S1/S2 +, no rubs, no gallops Respiratory: CTA bilaterally, no wheezing, no rhonchi Abdominal: Soft, NT, ND, bowel sounds + Extremities: no edema, no cyanosis  Discharge Instructions  Discharge Instructions     (HEART FAILURE PATIENTS) Call MD:  Anytime you have any of the following symptoms: 1) 3 pound weight gain in 24 hours or 5 pounds in 1 week 2) shortness of breath, with or without a dry hacking cough 3) swelling in the hands, feet or stomach 4) if you have to sleep on extra pillows at night in order to breathe.   Complete by: As directed    Discharge instructions   Complete by: As directed    Follow-up with [facility], GI, PCP  Please call call MD or return to ER for similar or worsening recurring problem that brought you to hospital or if any fever,nausea/vomiting,abdominal pain, uncontrolled pain, chest pain,  shortness of breath or any other alarming symptoms.  Please follow-up your doctor as instructed in a week time and call the office for appointment.  Please avoid alcohol, smoking, or any other illicit substance and maintain healthy habits including taking your regular medications as prescribed.  You were cared for by a hospitalist during your hospital stay. If you have any questions about your discharge medications or the care you received while you were in the hospital after you are discharged, you can call the unit and ask to speak with the hospitalist on call if the hospitalist that took care of you is not available.  Once you are discharged, your primary care physician will handle any further medical issues. Please note that NO REFILLS for any discharge medications will be authorized once you are  discharged, as it is imperative that you return to your primary care physician (or establish a relationship with a primary care physician if you do not have one) for your aftercare needs so that they can reassess your need for medications and monitor your lab values   Increase activity slowly   Complete by: As directed       Allergies as of 09/15/2022       Reactions   Apple Juice Swelling   Tongue swelling   Cucumber Extract Itching, Nausea And Vomiting   No extracts; just cucumber   Depakote Er [divalproex Sodium Er] Swelling   Tongue swelling   Depakote [valproic Acid] Anaphylaxis   Peanut Butter Flavor Anaphylaxis, Swelling   Peanut Oil Swelling   Peanut-containing Drug Products Swelling   Shellfish Allergy Itching, Swelling   Shrimp Extract Allergy Skin Test Itching, Swelling   Strawberry Extract  Nausea And Vomiting, Swelling   Apple Swelling   Cantaloupe Extract Allergy Skin Test Rash   Codeine Itching, Rash   Patient reports he can take "CODONES" without problems   Firvanq [vancomycin] Rash   Lactose Intolerance (gi) Diarrhea, Other (See Comments)   Indigestion  Stomach pain Flatulence        Medication List     TAKE these medications    busPIRone 15 MG tablet Commonly known as: BUSPAR Take 15 mg by mouth 2 (two) times daily.   furosemide 40 MG tablet Commonly known as: Lasix Take 1 tablet (40 mg total) by mouth daily.   hydrocortisone 25 MG suppository Commonly known as: ANUSOL-HC Place 1 suppository (25 mg total) rectally 2 (two) times daily. What changed:  when to take this reasons to take this   lactulose (encephalopathy) 10 GM/15ML Soln Commonly known as: CHRONULAC Take 10 g by mouth daily as needed (constipation).   Magnesium Chloride 64 MG Tabs Take 1 tablet by mouth in the morning and at bedtime.   ondansetron 8 MG disintegrating tablet Commonly known as: ZOFRAN-ODT Dissolve 1 tablet (8 mg total) by mouth every 8 (eight) hours as needed  for nausea.   pantoprazole 40 MG tablet Commonly known as: PROTONIX Take 1 tablet (40 mg total) by mouth daily.   phytonadione 5 MG tablet Commonly known as: VITAMIN K Take 1 tablet (5 mg total) by mouth daily for 5 days. Start taking on: September 16, 2022   potassium chloride SA 20 MEQ tablet Commonly known as: KLOR-CON M Take 1 tablet (20 mEq total) by mouth 2 (two) times daily.   prochlorperazine 10 MG tablet Commonly known as: COMPAZINE Take 1 tablet (10 mg total) by mouth every 6 (six) hours as needed for nausea or vomiting.   saccharomyces boulardii 250 MG capsule Commonly known as: FLORASTOR Take 250 mg by mouth 2 (two) times daily.   spironolactone 100 MG tablet Commonly known as: ALDACTONE Take 1 tablet (100 mg total) by mouth daily. What changed: Another medication with the same name was removed. Continue taking this medication, and follow the directions you see here.   triamcinolone cream 0.5 % Commonly known as: KENALOG Apply 1 Application topically 2 (two) times daily. To affected areas.   Trintellix 10 MG Tabs tablet Generic drug: vortioxetine HBr Take 1 tablet (10 mg total) by mouth daily.        Follow-up Information     [name], DO Follow up in 1 week(s).   Specialty: Family Medicine Contact information: [address] [place] [address] [phone]                Allergies  Allergen Reactions   Apple Juice Swelling    Tongue swelling   Cucumber Extract Itching and Nausea And Vomiting    No extracts; just cucumber   Depakote Er [Divalproex Sodium Er] Swelling    Tongue swelling   Depakote [Valproic Acid] Anaphylaxis   Peanut Butter Flavor Anaphylaxis and Swelling   Peanut Oil Swelling   Peanut-Containing Drug Products Swelling   Shellfish Allergy Itching and Swelling   Shrimp Extract Allergy Skin Test Itching and Swelling   Strawberry Extract Nausea And Vomiting and Swelling   Apple Swelling    Cantaloupe Extract Allergy Skin Test Rash   Codeine Itching and Rash    Patient reports he can take "CODONES" without problems   Firvanq [Vancomycin] Rash   Lactose Intolerance (Gi) Diarrhea and Other (See  Comments)    Indigestion  Stomach pain Flatulence    The results of significant diagnostics from this hospitalization (including imaging, microbiology, ancillary and laboratory) are listed below for reference.    Microbiology: Recent Results (from the past 240 hour(s))  Resp panel by RT-PCR (RSV, Flu A&B, Covid) Anterior Nasal Swab     Status: None   Collection Time: 09/13/22  3:08 PM   Specimen: Anterior Nasal Swab  Result Value Ref Range Status   SARS Coronavirus 2 by RT PCR NEGATIVE NEGATIVE Final    Comment: (NOTE) SARS-CoV-2 target nucleic acids are NOT DETECTED.  The SARS-CoV-2 RNA is generally detectable in upper respiratory specimens during the acute phase of infection. The lowest concentration of SARS-CoV-2 viral copies this assay can detect is 138 copies/mL. A negative result does not preclude SARS-Cov-2 infection and should not be used as the sole basis for treatment or other patient management decisions. A negative result may occur with  improper specimen collection/handling, submission of specimen other than nasopharyngeal swab, presence of viral mutation(s) within the areas targeted by this assay, and inadequate number of viral copies(<138 copies/mL). A negative result must be combined with clinical observations, patient history, and epidemiological information. The expected result is Negative.  Fact Sheet for Patients:  BloggerCourse.com  Fact Sheet for Healthcare Providers:  SeriousBroker.it  This test is no t yet approved or cleared by the [place] FDA and  has been authorized for detection and/or diagnosis of SARS-CoV-2 by FDA under an Emergency Use Authorization (EUA). This EUA will remain  in  effect (meaning this test can be used) for the duration of the COVID-19 declaration under Section 564(b)(1) of the Act, 21 U.S.C.section 360bbb-3(b)(1), unless the authorization is terminated  or revoked sooner.       Influenza A by PCR NEGATIVE NEGATIVE Final   Influenza B by PCR NEGATIVE NEGATIVE Final    Comment: (NOTE) The Xpert Xpress SARS-CoV-2/FLU/RSV plus assay is intended as an aid in the diagnosis of influenza from Nasopharyngeal swab specimens and should not be used as a sole basis for treatment. Nasal washings and aspirates are unacceptable for Xpert Xpress SARS-CoV-2/FLU/RSV testing.  Fact Sheet for Patients: BloggerCourse.com  Fact Sheet for Healthcare Providers: SeriousBroker.it  This test is not yet approved or cleared by the [place] FDA and has been authorized for detection and/or diagnosis of SARS-CoV-2 by FDA under an Emergency Use Authorization (EUA). This EUA will remain in effect (meaning this test can be used) for the duration of the COVID-19 declaration under Section 564(b)(1) of the Act, 21 U.S.C. section 360bbb-3(b)(1), unless the authorization is terminated or revoked.     Resp Syncytial Virus by PCR NEGATIVE NEGATIVE Final    Comment: (NOTE) Fact Sheet for Patients: BloggerCourse.com  Fact Sheet for Healthcare Providers: SeriousBroker.it  This test is not yet approved or cleared by the [place] FDA and has been authorized for detection and/or diagnosis of SARS-CoV-2 by FDA under an Emergency Use Authorization (EUA). This EUA will remain in effect (meaning this test can be used) for the duration of the COVID-19 declaration under Section 564(b)(1) of the Act, 21 U.S.C. section 360bbb-3(b)(1), unless the authorization is terminated or revoked.  Performed at [facility], [address] [place], [place] [address]      Procedures/Studies: DG Chest 2 View  Result Date: 09/13/2022 CLINICAL DATA:  Shortness of breath EXAM: CHEST - 2 VIEW COMPARISON:  05/05/2022 FINDINGS: The heart size and mediastinal contours are within normal  limits. Low lung volumes. Small bilateral pleural effusions with bibasilar atelectasis. No pneumothorax. The visualized skeletal structures are unremarkable. IMPRESSION: Small bilateral pleural effusions with bibasilar atelectasis. Electronically Signed   By: Duanne GuessNicholas  Plundo D.O.   On: 09/13/2022 15:39   US Venous Img Lower Bilateral (DVT)  Result Date: 09/08/2022 CLINICAL DATA:  Bilateral leg swelling, history of cirrhosis EXAM: BILATERAL LOWER EXTREMITY VENOUS DOPPLER ULTRASOUND TECHNIQUE: Gray-scale sonography with graded compression, as well as color Doppler and duplex ultrasound were performed to evaluate the lower extremity deep venous systems from the level of the common femoral vein and including the common femoral, femoral, profunda femoral, popliteal and calf veins including the posterior tibial, peroneal and gastrocnemius veins when visible. The superficial great saphenous vein was also interrogated. Spectral Doppler was utilized to evaluate flow at rest and with distal augmentation maneuvers in the common femoral, femoral and popliteal veins. COMPARISON:  None Available. FINDINGS: RIGHT LOWER EXTREMITY Common Femoral Vein: No evidence of thrombus. Normal compressibility, respiratory phasicity and response to augmentation. Saphenofemoral Junction: No evidence of thrombus. Normal compressibility and flow on color Doppler imaging. Profunda Femoral Vein: No evidence of thrombus. Normal compressibility and flow on color Doppler imaging. Femoral Vein: No evidence of thrombus. Normal compressibility, respiratory phasicity and response to augmentation. Popliteal Vein: No evidence of thrombus. Normal compressibility, respiratory phasicity and response to augmentation. Calf Veins: No evidence of  thrombus. Normal compressibility and flow on color Doppler imaging. Superficial Great Saphenous Vein: No evidence of thrombus. Normal compressibility. Venous Reflux:  None. Other Findings: Mild subcutaneous edema in the calf. There has a prominent lymph node in the right groin that is partially imaged, measuring up to 3.6 cm in long axis with cortical thickness of up to 1 mm. LEFT LOWER EXTREMITY Common Femoral Vein: No evidence of thrombus. Normal compressibility, respiratory phasicity and response to augmentation. Saphenofemoral Junction: No evidence of thrombus. Normal compressibility and flow on color Doppler imaging. Profunda Femoral Vein: No evidence of thrombus. Normal compressibility and flow on color Doppler imaging. Femoral Vein: No evidence of thrombus. Normal compressibility, respiratory phasicity and response to augmentation. Popliteal Vein: No evidence of thrombus. Normal compressibility, respiratory phasicity and response to augmentation. Calf Veins: No evidence of thrombus. Normal compressibility and flow on color Doppler imaging. Superficial Great Saphenous Vein: No evidence of thrombus. Normal compressibility. Venous Reflux:  None. Other Findings: Mild subcutaneous edema in the calf. IMPRESSION: 1. No evidence of deep venous thrombosis in either lower extremity. 2. Partially imaged prominent right groin lymph node that is enlarged in long axis but demonstrates normal cortical thickness. This is favored to be reactive. Electronically Signed   By: Jacob MooresMeghana  Konanur M.D.   On: 09/08/2022 11:01   US Paracentesis  Result Date: 08/17/2022 INDICATION: Patient with history of alcoholic cirrhosis, GI bleed, anemia, coagulopathy/thrombocytopenia, diastolic dysfunction, ascites. Request received for diagnostic and therapeutic paracentesis. EXAM: ULTRASOUND GUIDED DIAGNOSTIC AND THERAPEUTIC PARACENTESIS MEDICATIONS: 10 mL 1% lidocaine COMPLICATIONS: None immediate. PROCEDURE: Informed written consent was  obtained from the patient after a discussion of the risks, benefits and alternatives to treatment. A timeout was performed prior to the initiation of the procedure. Initial ultrasound scanning demonstrates a small amount of ascites within the right lower abdominal quadrant. The right lower abdomen was prepped and draped in the usual sterile fashion. 1% lidocaine was used for local anesthesia. Following this, a 19 gauge, 10-cm, Yueh catheter was introduced. An ultrasound image was saved for documentation purposes. The paracentesis was performed. The catheter was removed and  a dressing was applied. The patient tolerated the procedure well without immediate post procedural complication. FINDINGS: A total of approximately 1 liter of yellow fluid was removed. Samples were sent to the laboratory as requested by the clinical team. IMPRESSION: Successful ultrasound-guided diagnostic and therapeutic paracentesis yielding 1 liter of peritoneal fluid. PLAN: If the patient eventually requires >/=2 paracenteses in a 30 day period, candidacy for formal evaluation by [facility] will be assessed. Read by: Jeananne RamaKevin Allred, PA-C Electronically Signed   By: Richarda OverlieAdam  Henn M.D.   On: 08/17/2022 16:44   CT ANGIO GI BLEED  Result Date: 08/17/2022 CLINICAL DATA:  Lower gastrointestinal bleeding. Plate red blood per rectum. EXAM: CTA ABDOMEN AND PELVIS WITHOUT AND WITH CONTRAST TECHNIQUE: Multidetector CT imaging of the abdomen and pelvis was performed using the standard protocol during bolus administration of intravenous contrast. Multiplanar reconstructed images and MIPs were obtained and reviewed to evaluate the vascular anatomy. RADIATION DOSE REDUCTION: This exam was performed according to the departmental dose-optimization program which includes automated exposure control, adjustment of the mA and/or kV according to patient size and/or use of iterative reconstruction technique.  CONTRAST:  100mL OMNIPAQUE IOHEXOL 350 MG/ML SOLN COMPARISON:  CTA 01/30/2022 FINDINGS: VASCULAR Aorta: Normal caliber aorta without aneurysm, dissection, vasculitis or significant stenosis. Celiac: Patent without evidence of aneurysm, dissection, vasculitis or significant stenosis. SMA: Patent without evidence of aneurysm, dissection, vasculitis or significant stenosis. Renals: Both renal arteries are patent without evidence of aneurysm, dissection, vasculitis, fibromuscular dysplasia or significant stenosis. IMA: Patent without evidence of aneurysm, dissection, vasculitis or significant stenosis. Inflow: Patent without evidence of aneurysm, dissection, vasculitis or significant stenosis. Proximal Outflow: Bilateral common femoral and visualized portions of the superficial and profunda femoral arteries are patent without evidence of aneurysm, dissection, vasculitis or significant stenosis. Veins: Venous collaterals in the upper abdomen about the spleen. Recanalization of the umbilical vein. Review of the MIP images confirms the above findings. NON-VASCULAR Lower chest: Bilateral pleural effusions and mild basilar atelectasis. Hepatobiliary: Small gallstones collect in the lumen the gallbladder. No inflammation. Enlarged caudate lobe. Nodular contour of the liver. Portal veins are patent. Moderate volume ascites in the upper abdomen. Pancreas: No inflammation Spleen: Normal small volume spleen. Venous collaterals as described above Adrenals/Urinary Tract: Kidneys enhance symmetrically. Ureters and bladder normal. Stomach/Bowel: No evidence of active gastrointestinal bleeding within the colon. Several hyperdense foci within the distal small bowel in the RIGHT lower quadrant are present on the noncontrast and contrast series and favored intraluminal contents and not active bleeding. No change on the portal venous phase imaging to suggest pooling blood. No evidence of bowel obstruction.  Stomach and duodenum are  normal. Lymphatic: Mild periaortic lymphadenopathy not changed. Reproductive: Unremarkable Other: Small volume ascites. Musculoskeletal: No aggressive osseous lesion. IMPRESSION: VASCULAR 1. No evidence of active bleeding into the small bowel or colon. 2. 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 NON-VASCULAR 1. Sequela of ascites and portal hypertension. 2. Small moderate ascites. Electronically Signed   By: Genevive BiStewart  Edmunds M.D.   On: 08/17/2022 10:09    Labs: BNP (last 3 results) Recent Labs    01/10/22 1735  BNP 85.4   Basic Metabolic Panel: Recent Labs  Lab 09/13/22 1508 09/14/22 0538 09/15/22 0841  NA 131* 134* 135  K 2.9* 3.2* 3.5  CL 104 106 107  CO2 20* 21* 21*  GLUCOSE 110* 93 90  BUN 11 10 9   CREATININE 0.75 0.85 0.76  CALCIUM  7.9* 7.8* 7.6*  MG  --  1.4* 1.6*   Liver Function Tests: Recent Labs  Lab 09/13/22 1508 09/14/22 0538  AST 67* 63*  ALT 20 18  ALKPHOS 59 51  BILITOT 3.1* 2.8*  PROT 6.0* 6.0*  ALBUMIN 2.3* 2.3*   No results for input(s): "LIPASE", "AMYLASE" in the last 168 hours. No results for input(s): "AMMONIA" in the last 168 hours. CBC: Recent Labs  Lab 09/13/22 1508 09/14/22 0538 09/14/22 1833 09/14/22 2103 09/15/22 0841  WBC 6.1 7.4  --   --  6.6  NEUTROABS 3.7 4.8  --   --   --   HGB 7.3* 7.1* 8.4* 8.0* 8.5*  HCT 23.1* 22.4* 26.4* 25.4* 27.3*  MCV 81.3 83.9  --   --  85.3  PLT 70* 69*  --   --  75*   Cardiac Enzymes: No results for input(s): "CKTOTAL", "CKMB", "CKMBINDEX", "TROPONINI" in the last 168 hours. BNP: Invalid input(s): "POCBNP" CBG: No results for input(s): "GLUCAP" in the last 168 hours. D-Dimer No results for input(s): "DDIMER" in the last 72 hours. Hgb A1c No results for input(s): "HGBA1C" in the last 72 hours. Lipid Profile No results for input(s): "CHOL", "HDL", "LDLCALC", "TRIG", "CHOLHDL", "LDLDIRECT" in the last 72 hours. Thyroid  function studies No results for input(s): "TSH", "T4TOTAL", "T3FREE", "THYROIDAB" in the last 72 hours.  Invalid input(s): "FREET3" Anemia work up No results for input(s): "VITAMINB12", "FOLATE", "FERRITIN", "TIBC", "IRON", "RETICCTPCT" in the last 72 hours. Urinalysis    Component Value Date/Time   COLORURINE YELLOW 09/14/2022 1042   APPEARANCEUR CLEAR 09/14/2022 1042   LABSPEC 1.008 09/14/2022 1042   PHURINE 9.0 (H) 09/14/2022 1042   GLUCOSEU NEGATIVE 09/14/2022 1042   HGBUR LARGE (A) 09/14/2022 1042   BILIRUBINUR NEGATIVE 09/14/2022 1042   BILIRUBINUR small (A) 04/17/2021 1646   KETONESUR NEGATIVE 09/14/2022 1042   PROTEINUR NEGATIVE 09/14/2022 1042   UROBILINOGEN 4.0 (A) 04/17/2021 1646   NITRITE NEGATIVE 09/14/2022 1042   LEUKOCYTESUR NEGATIVE 09/14/2022 1042   Sepsis Labs Recent Labs  Lab 09/13/22 1508 09/14/22 0538 09/15/22 0841  WBC 6.1 7.4 6.6   Microbiology Recent Results (from the past 240 hour(s))  Resp panel by RT-PCR (RSV, Flu A&B, Covid) Anterior Nasal Swab     Status: None   Collection Time: 09/13/22  3:08 PM   Specimen: Anterior Nasal Swab  Result Value Ref Range Status   SARS Coronavirus 2 by RT PCR NEGATIVE NEGATIVE Final    Comment: (NOTE) SARS-CoV-2 target nucleic acids are NOT DETECTED.  The SARS-CoV-2 RNA is generally detectable in upper respiratory specimens during the acute phase of infection. The lowest concentration of SARS-CoV-2 viral copies this assay can detect is 138 copies/mL. A negative result does not preclude SARS-Cov-2 infection and should not be used as the sole basis for treatment or other patient management decisions. A negative result may occur with  improper specimen collection/handling, submission of specimen other than nasopharyngeal swab, presence of viral mutation(s) within the areas targeted by this assay, and inadequate number of viral copies(<138 copies/mL). A negative result must be combined with clinical  observations, patient history, and epidemiological information. The expected result is Negative.  Fact Sheet for Patients:  EntrepreneurPulse.com.au  Fact Sheet for Healthcare Providers:  IncredibleEmployment.be  This test is no t yet approved or cleared by the [place] FDA and  has been authorized for detection and/or diagnosis of SARS-CoV-2 by FDA under an Emergency Use Authorization (EUA). This EUA will remain  in  effect (meaning this test can be used) for the duration of the COVID-19 declaration under Section 564(b)(1) of the Act, 21 U.S.C.section 360bbb-3(b)(1), unless the authorization is terminated  or revoked sooner.       Influenza A by PCR NEGATIVE NEGATIVE Final   Influenza B by PCR NEGATIVE NEGATIVE Final    Comment: (NOTE) The Xpert Xpress SARS-CoV-2/FLU/RSV plus assay is intended as an aid in the diagnosis of influenza from Nasopharyngeal swab specimens and should not be used as a sole basis for treatment. Nasal washings and aspirates are unacceptable for Xpert Xpress SARS-CoV-2/FLU/RSV testing.  Fact Sheet for Patients: BloggerCourse.com  Fact Sheet for Healthcare Providers: SeriousBroker.it  This test is not yet approved or cleared by the [place] FDA and has been authorized for detection and/or diagnosis of SARS-CoV-2 by FDA under an Emergency Use Authorization (EUA). This EUA will remain in effect (meaning this test can be used) for the duration of the COVID-19 declaration under Section 564(b)(1) of the Act, 21 U.S.C. section 360bbb-3(b)(1), unless the authorization is terminated or revoked.     Resp Syncytial Virus by PCR NEGATIVE NEGATIVE Final    Comment: (NOTE) Fact Sheet for Patients: BloggerCourse.com  Fact Sheet for Healthcare Providers: SeriousBroker.it  This test is not yet approved or cleared by  the [place] FDA and has been authorized for detection and/or diagnosis of SARS-CoV-2 by FDA under an Emergency Use Authorization (EUA). This EUA will remain in effect (meaning this test can be used) for the duration of the COVID-19 declaration under Section 564(b)(1) of the Act, 21 U.S.C. section 360bbb-3(b)(1), unless the authorization is terminated or revoked.  Performed at [facility], [address] [place], [place] [address]    Time coordinating discharge: 25 minutes  SIGNED: Lanae Boast, MD  [facility] 09/15/2022, 11:45 AM  If 7PM-7AM, please contact night-coverage [email or web address]

## 2022-09-16 ENCOUNTER — Telehealth: Payer: Self-pay

## 2022-09-16 ENCOUNTER — Other Ambulatory Visit (HOSPITAL_BASED_OUTPATIENT_CLINIC_OR_DEPARTMENT_OTHER): Payer: Self-pay

## 2022-09-16 NOTE — Telephone Encounter (Signed)
Transition Care Management Follow-up Telephone Call Date of discharge and from where: [facility] 09/15/2022 How have you been since you were released from the hospital? tired Any questions or concerns? No  Items Reviewed: Did the pt receive and understand the discharge instructions provided? Yes  Medications obtained and verified? Yes  Other? No  Any new allergies since your discharge? No  Dietary orders reviewed? Yes Do you have support at home? Yes   Home Care and Equipment/Supplies: Were home health services ordered? no If so, what is the name of the agency? N/a  Has the agency set up a time to come to the patient's home? no Were any new equipment or medical supplies ordered?  No What is the name of the medical supply agency? N/a Were you able to get the supplies/equipment? not applicable Do you have any questions related to the use of the equipment or supplies? No  Functional Questionnaire: (I = Independent and D = Dependent) ADLs: I  Bathing/Dressing- I  Meal Prep- I  Eating- I  Maintaining continence- I  Transferring/Ambulation- I  Managing Meds- I  Follow up appointments reviewed:  PCP Hospital f/u appt confirmed? No  no avail appts Sent message to staff to schedule Specialist Hospital f/u appt confirmed? No  Are transportation arrangements needed? No  If their condition worsens, is the pt aware to call PCP or go to the Emergency Dept.? Yes Was the patient provided with contact information for the PCP's office or ED? Yes Was to pt encouraged to call back with questions or concerns? Yes Juanda Crumble, LPN [facility] [facility] [phone]

## 2022-09-17 ENCOUNTER — Other Ambulatory Visit (HOSPITAL_BASED_OUTPATIENT_CLINIC_OR_DEPARTMENT_OTHER): Payer: Self-pay

## 2022-09-20 ENCOUNTER — Encounter: Payer: Self-pay | Admitting: Family Medicine

## 2022-09-27 ENCOUNTER — Inpatient Hospital Stay: Payer: Non-veteran care | Admitting: Family Medicine

## 2022-09-27 NOTE — Telephone Encounter (Signed)
[name] prepaid already $29 for FMLA papers for her husband to pick up during his appt. Placed in Dr. Zigmund Daniel in basket.

## 2022-10-01 ENCOUNTER — Ambulatory Visit (INDEPENDENT_AMBULATORY_CARE_PROVIDER_SITE_OTHER): Payer: Commercial Managed Care - PPO | Admitting: Family Medicine

## 2022-10-01 ENCOUNTER — Encounter: Payer: Self-pay | Admitting: Family Medicine

## 2022-10-01 VITALS — BP 100/61 | HR 83 | Ht 63.0 in | Wt 178.0 lb

## 2022-10-01 DIAGNOSIS — I10 Essential (primary) hypertension: Secondary | ICD-10-CM | POA: Diagnosis not present

## 2022-10-01 DIAGNOSIS — D696 Thrombocytopenia, unspecified: Secondary | ICD-10-CM

## 2022-10-01 DIAGNOSIS — K7030 Alcoholic cirrhosis of liver without ascites: Secondary | ICD-10-CM

## 2022-10-01 DIAGNOSIS — K920 Hematemesis: Secondary | ICD-10-CM | POA: Diagnosis not present

## 2022-10-01 DIAGNOSIS — F1020 Alcohol dependence, uncomplicated: Secondary | ICD-10-CM | POA: Diagnosis not present

## 2022-10-01 DIAGNOSIS — G721 Alcoholic myopathy: Secondary | ICD-10-CM

## 2022-10-01 DIAGNOSIS — F419 Anxiety disorder, unspecified: Secondary | ICD-10-CM | POA: Diagnosis not present

## 2022-10-01 NOTE — Progress Notes (Signed)
Juan [name] - 57 y.o. male MRN [id number]  Date of birth: January 29, 1966  Subjective Chief Complaint  Patient presents with   Hospitalization Follow-up    HPI Juan [name] is a 57 y.o. male here today for hospital follow up .   Recently hospitalized for hematemesis and blood in his stool.  Hgb 7.3 at admission.  Hypokalemic with mild hyponatremia as well.  Respiratory panel negative.  Received transfusion with improvement and stabilization of H&H.  He was also given vitamin K.  Platelets have remained low.  He reports that he is feeling much better today.  BP is stable.  Scheduled to have hemorrhoid banding later this month.  His wife needs FMLA paperwork completed.  He has not noted any further bleeding at this time.  Appetite is Ok.  Denies fever or chills.  Continues to see GI locally and [facility]  ROS:  A comprehensive ROS was completed and negative except as noted per HPI  Allergies  Allergen Reactions   Apple Juice Swelling    Tongue swelling   Cucumber Extract Itching and Nausea And Vomiting    No extracts; just cucumber   Depakote Er [Divalproex Sodium Er] Swelling    Tongue swelling   Depakote [Valproic Acid] Anaphylaxis   Peanut Butter Flavor Anaphylaxis and Swelling   Peanut Oil Swelling   Peanut-Containing Drug Products Swelling   Shellfish Allergy Itching and Swelling   Shrimp Extract Allergy Skin Test Itching and Swelling   Strawberry Extract Nausea And Vomiting and Swelling   Apple Swelling   Cantaloupe Extract Allergy Skin Test Rash   Codeine Itching and Rash    Patient reports he can take "CODONES" without problems   Firvanq [Vancomycin] Rash   Lactose Intolerance (Gi) Diarrhea and Other (See Comments)    Indigestion  Stomach pain Flatulence    Past Medical History:  Diagnosis Date   Alcohol abuse 01/11/2022   Alcohol addiction (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)     Past Surgical History:  Procedure Laterality Date   BIOPSY  09/24/2021   Procedure: BIOPSY;  Surgeon: Sharyn Creamer, MD;  Location: [facility] ENDOSCOPY;  Service: Gastroenterology;;   Wilmon Pali RELEASE Bilateral    COLONOSCOPY WITH PROPOFOL N/A 09/24/2021   Procedure: COLONOSCOPY WITH PROPOFOL;  Surgeon: Sharyn Creamer, MD;  Location: [facility];  Service: Gastroenterology;  Laterality: N/A;   ESOPHAGOGASTRODUODENOSCOPY (EGD) WITH PROPOFOL N/A 09/24/2021   Procedure: ESOPHAGOGASTRODUODENOSCOPY (EGD) WITH PROPOFOL;  Surgeon: Sharyn Creamer, MD;  Location: [facility];  Service: Gastroenterology;  Laterality: N/A;   ESOPHAGOGASTRODUODENOSCOPY (EGD) WITH PROPOFOL N/A 08/18/2022   Procedure: ESOPHAGOGASTRODUODENOSCOPY (EGD) WITH PROPOFOL;  Surgeon: Irene Shipper, MD;  Location: [facility] ENDOSCOPY;  Service: Gastroenterology;  Laterality: N/A;   FLEXIBLE SIGMOIDOSCOPY N/A 08/18/2022   Procedure: FLEXIBLE SIGMOIDOSCOPY;  Surgeon: Irene Shipper, MD;  Location: [facility];  Service: Gastroenterology;  Laterality: N/A;   FRACTURE SURGERY     left ankle plate   HERNIA REPAIR     inguinal   KNEE SURGERY Right  x 4   SHOULDER SURGERY Bilateral    x 2   VASECTOMY      Social History   Socioeconomic History   Marital status: Married    Spouse name: [name]   Number of children: 2   Years of education: Not on file   Highest education level: High school graduate  Occupational History    Comment: disability  Tobacco Use   Smoking status: Every Day    Types: Cigarettes   Smokeless tobacco: Current    Types: Snuff  Vaping Use   Vaping Use: Never used  Substance and Sexual Activity   Alcohol use:  Not Currently   Drug use: Never   Sexual activity: Not on file  Other Topics Concern   Not on file  Social History Narrative   Lives with wife   Social Determinants of Health   Financial Resource Strain: Not on file  Food Insecurity: No Food Insecurity (08/17/2022)   Hunger Vital Sign    Worried About Running Out of Food in the Last Year: Never true    Ran Out of Food in the Last Year: Never true  Transportation Needs: No Transportation Needs (08/17/2022)   PRAPARE - Hydrologist (Medical): No    Lack of Transportation (Non-Medical): No  Physical Activity: Not on file  Stress: Not on file  Social Connections: Not on file    Family History  Problem Relation Age of Onset   Pulmonary fibrosis Mother    Hypertension Father    Other Father        liver failure   Diabetes Brother    Breast cancer Paternal Aunt    Colon cancer Neg Hx    Esophageal cancer Neg Hx    Stomach cancer Neg Hx    Rectal cancer Neg Hx     Health Maintenance  Topic Date Due   INFLUENZA VACCINE  11/21/2022 (Originally 03/23/2022)   COVID-19 Vaccine (4 - 2023-24 season) 03/01/2023 (Originally 04/23/2022)   DTaP/Tdap/Td (3 - Td or Tdap) 06/20/2029   COLONOSCOPY (Pts 45-56yr Insurance coverage will need to be confirmed)  09/25/2031   Hepatitis C Screening  Completed   HIV Screening  Completed   Zoster Vaccines- Shingrix  Completed   HPV VACCINES  Aged Out     ----------------------------------------------------------------------------------------------------------------------------------------------------------------------------------------------------------------- Physical Exam BP 100/61 (BP Location: Left Arm, Patient Position: Sitting, Cuff Size: Large)   Pulse 83   Ht 5' 3"$  (1.6 m)   Wt 178 lb (80.7 kg)   SpO2 100%   BMI 31.53 kg/m   Physical Exam Constitutional:      Appearance: Normal appearance.  HENT:     Head: Normocephalic and atraumatic.  Eyes:      General: No scleral icterus. Cardiovascular:     Rate and Rhythm: Normal rate and regular rhythm.  Pulmonary:     Effort: Pulmonary effort is normal.     Breath sounds: Normal breath sounds.  Abdominal:     General: Abdomen is flat. There is no distension.     Palpations: Abdomen is soft.  Neurological:     Mental Status: He is alert.     Comments: Ambulates with walker.  Psychiatric:        Mood and Affect: Mood normal.        Behavior: Behavior normal.     ------------------------------------------------------------------------------------------------------------------------------------------------------------------------------------------------------------------- Assessment and Plan  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 He has been removed today from alcohol for period of time.  Recommend continued abstinence.  He does use a walker for stability due to recurrent falls.  Continues with PT OT.  Thrombocytopenia ([place]) Update CBC today.  Hypomagnesemia Continues on magnesium supplement.  Repeat magnesium levels today.  Anxiety Stable with combination of Trintellix and BuSpar.  Alcoholic cirrhosis of liver without ascites (HCC) LFTs and coags are stable at this time.  Continue to abstain from alcohol.  Hematemesis Denies any further hematemesis.  Rechecking H&H today.  Has follow-up with GI.   No orders of the defined types were placed in this encounter.   No follow-ups on file.    This visit occurred during the SARS-CoV-2 public health emergency.  Safety protocols were in place, including screening questions prior to the visit, additional usage of staff PPE, and extensive cleaning of exam room while observing appropriate contact time as indicated for disinfecting solutions.

## 2022-10-01 NOTE — Telephone Encounter (Signed)
Forms completed and given to [place] today.

## 2022-10-02 LAB — COMPLETE METABOLIC PANEL WITH GFR
AG Ratio: 0.7 (calc) — ABNORMAL LOW (ref 1.0–2.5)
ALT: 12 U/L (ref 9–46)
AST: 42 U/L — ABNORMAL HIGH (ref 10–35)
Albumin: 2.7 g/dL — ABNORMAL LOW (ref 3.6–5.1)
Alkaline phosphatase (APISO): 71 U/L (ref 35–144)
BUN/Creatinine Ratio: 17 (calc) (ref 6–22)
BUN: 11 mg/dL (ref 7–25)
CO2: 23 mmol/L (ref 20–32)
Calcium: 8 mg/dL — ABNORMAL LOW (ref 8.6–10.3)
Chloride: 105 mmol/L (ref 98–110)
Creat: 0.63 mg/dL — ABNORMAL LOW (ref 0.70–1.30)
Globulin: 3.8 g/dL (calc) — ABNORMAL HIGH (ref 1.9–3.7)
Glucose, Bld: 141 mg/dL — ABNORMAL HIGH (ref 65–99)
Potassium: 3 mmol/L — ABNORMAL LOW (ref 3.5–5.3)
Sodium: 136 mmol/L (ref 135–146)
Total Bilirubin: 2.9 mg/dL — ABNORMAL HIGH (ref 0.2–1.2)
Total Protein: 6.5 g/dL (ref 6.1–8.1)
eGFR: 113 mL/min/{1.73_m2} (ref >=60)

## 2022-10-02 LAB — CBC WITH DIFFERENTIAL/PLATELET
Absolute Monocytes: 907 cells/uL (ref 200–950)
Basophils Absolute: 49 cells/uL (ref 0–200)
Basophils Relative: 1 %
Eosinophils Absolute: 328 cells/uL (ref 15–500)
Eosinophils Relative: 6.7 %
HCT: 24.2 % — ABNORMAL LOW (ref 38.5–50.0)
Hemoglobin: 8 g/dL — ABNORMAL LOW (ref 13.2–17.1)
Lymphs Abs: 1098 cells/uL (ref 850–3900)
MCH: 27.4 pg (ref 27.0–33.0)
MCHC: 33.1 g/dL (ref 32.0–36.0)
MCV: 82.9 fL (ref 80.0–100.0)
MPV: 9.5 fL (ref 7.5–12.5)
Monocytes Relative: 18.5 %
Neutro Abs: 2519 cells/uL (ref 1500–7800)
Neutrophils Relative %: 51.4 %
Platelets: 51 10*3/uL — ABNORMAL LOW (ref 140–400)
RBC: 2.92 10*6/uL — ABNORMAL LOW (ref 4.20–5.80)
RDW: 20.3 % — ABNORMAL HIGH (ref 11.0–15.0)
Total Lymphocyte: 22.4 %
WBC: 4.9 10*3/uL (ref 3.8–10.8)

## 2022-10-02 LAB — MAGNESIUM: Magnesium: 1.5 mg/dL (ref 1.5–2.5)

## 2022-10-03 NOTE — Assessment & Plan Note (Signed)
Denies any further hematemesis.  Rechecking H&H today.  Has follow-up with GI.

## 2022-10-03 NOTE — Assessment & Plan Note (Signed)
Stable with combination of Trintellix and BuSpar.

## 2022-10-03 NOTE — Assessment & Plan Note (Signed)
Continues on magnesium supplement.  Repeat magnesium levels today.

## 2022-10-03 NOTE — Assessment & Plan Note (Signed)
Update CBC today.

## 2022-10-03 NOTE — Assessment & Plan Note (Signed)
He has been removed today from alcohol for period of time.  Recommend continued abstinence.  He does use a walker for stability due to recurrent falls.  Continues with PT OT.

## 2022-10-03 NOTE — Assessment & Plan Note (Signed)
LFTs and coags are stable at this time.  Continue to abstain from alcohol.

## 2022-10-05 ENCOUNTER — Encounter: Payer: Non-veteran care | Admitting: Internal Medicine

## 2022-10-05 DIAGNOSIS — G608 Other hereditary and idiopathic neuropathies: Secondary | ICD-10-CM | POA: Diagnosis not present

## 2022-10-05 DIAGNOSIS — R200 Anesthesia of skin: Secondary | ICD-10-CM | POA: Diagnosis not present

## 2022-10-07 DIAGNOSIS — K7460 Unspecified cirrhosis of liver: Secondary | ICD-10-CM | POA: Diagnosis not present

## 2022-10-07 DIAGNOSIS — R188 Other ascites: Secondary | ICD-10-CM | POA: Diagnosis not present

## 2022-10-08 ENCOUNTER — Encounter: Payer: Self-pay | Admitting: Family Medicine

## 2022-10-08 ENCOUNTER — Other Ambulatory Visit: Payer: Self-pay | Admitting: Family Medicine

## 2022-10-08 DIAGNOSIS — D649 Anemia, unspecified: Secondary | ICD-10-CM

## 2022-10-08 DIAGNOSIS — E876 Hypokalemia: Secondary | ICD-10-CM

## 2022-10-08 NOTE — Telephone Encounter (Signed)
Patient scheduled.

## 2022-10-08 NOTE — Telephone Encounter (Signed)
Yes.  If we have Heplisav I would prefer this.  If not, Engerix should be fine he would need 3 doses of this compared to 2 for the Heplisav.  Thanks!

## 2022-10-11 ENCOUNTER — Encounter: Payer: Self-pay | Admitting: Internal Medicine

## 2022-10-11 ENCOUNTER — Encounter: Payer: Self-pay | Admitting: Family Medicine

## 2022-10-12 ENCOUNTER — Encounter: Payer: Self-pay | Admitting: Family Medicine

## 2022-10-12 ENCOUNTER — Ambulatory Visit (INDEPENDENT_AMBULATORY_CARE_PROVIDER_SITE_OTHER): Payer: Commercial Managed Care - PPO | Admitting: Family Medicine

## 2022-10-12 ENCOUNTER — Encounter: Payer: Non-veteran care | Admitting: Internal Medicine

## 2022-10-12 VITALS — BP 101/56 | HR 90 | Ht 63.0 in | Wt 176.0 lb

## 2022-10-12 DIAGNOSIS — R600 Localized edema: Secondary | ICD-10-CM

## 2022-10-12 DIAGNOSIS — T148XXA Other injury of unspecified body region, initial encounter: Secondary | ICD-10-CM | POA: Diagnosis not present

## 2022-10-12 HISTORY — DX: Localized edema: R60.0

## 2022-10-12 NOTE — Progress Notes (Signed)
Juan [name] - 57 y.o. male MRN [id number]  Date of birth: 29-Oct-1965  Subjective Chief Complaint  Patient presents with   Leg Swelling    HPI Juan [name] is a 57 year old male here today with complaint of leg swelling and itching.  Reports that it continues to increase to the point where he is scratching excessively until bleeding.  He has noticed a small amount of weeping from the legs due to swelling.  He has not had increased shortness of breath, abdominal bloating or swelling or palpitations.  He is taking Aldactone and furosemide was recently added.  Noted to have hypokalemia at previous visit.  Potassium was increased and he is taking this as directed.  His potassium had increased on recent labs done at [facility] from 3.0-3.4.  ROS:  A comprehensive ROS was completed and negative except as noted per HPI   Allergies  Allergen Reactions   Apple Juice Swelling    Tongue swelling   Cucumber Extract Itching and Nausea And Vomiting    No extracts; just cucumber   Depakote Er [Divalproex Sodium Er] Swelling    Tongue swelling   Depakote [Valproic Acid] Anaphylaxis   Peanut Butter Flavor Anaphylaxis and Swelling   Peanut Oil Swelling   Peanut-Containing Drug Products Swelling   Shellfish Allergy Itching and Swelling   Shrimp Extract Allergy Skin Test Itching and Swelling   Strawberry Extract Nausea And Vomiting and Swelling   Apple Swelling   Cantaloupe Extract Allergy Skin Test Rash   Codeine Itching and Rash    Patient reports he can take "CODONES" without problems   Firvanq [Vancomycin] Rash   Lactose Intolerance (Gi) Diarrhea and Other (See Comments)    Indigestion  Stomach pain Flatulence    Past Medical History:  Diagnosis Date   Alcohol abuse 01/11/2022   Alcohol addiction ([place])    Anxiety    Bleeding internal hemorrhoids 08/18/2022   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   Chronic fatigue    Cirrhosis (HCC)    Colon polyps    Depression    GERD  (gastroesophageal reflux disease)    Hemochromatosis    Hiatal hernia 02/20/2022   Hx of blood clots    Leg   Hyperreflexia    Hypertension    PTSD (post-traumatic stress disorder)    Traumatic hemorrhagic shock [facility])     Past Surgical History:  Procedure Laterality Date   BIOPSY  09/24/2021   Procedure: BIOPSY;  Surgeon: Sharyn Creamer, MD;  Location: [place];  Service: Gastroenterology;;   Wilmon Pali RELEASE Bilateral    COLONOSCOPY WITH PROPOFOL N/A 09/24/2021   Procedure: COLONOSCOPY WITH PROPOFOL;  Surgeon: Sharyn Creamer, MD;  Location: [facility] ENDOSCOPY;  Service: Gastroenterology;  Laterality: N/A;   ESOPHAGOGASTRODUODENOSCOPY (EGD) WITH PROPOFOL N/A 09/24/2021   Procedure: ESOPHAGOGASTRODUODENOSCOPY (EGD) WITH PROPOFOL;  Surgeon: Sharyn Creamer, MD;  Location: [place];  Service: Gastroenterology;  Laterality: N/A;   ESOPHAGOGASTRODUODENOSCOPY (EGD) WITH PROPOFOL N/A 08/18/2022   Procedure: ESOPHAGOGASTRODUODENOSCOPY (EGD) WITH PROPOFOL;  Surgeon: Irene Shipper, MD;  Location: [facility] ENDOSCOPY;  Service: Gastroenterology;  Laterality: N/A;   FLEXIBLE SIGMOIDOSCOPY N/A 08/18/2022   Procedure: FLEXIBLE SIGMOIDOSCOPY;  Surgeon: Irene Shipper, MD;  Location: [facility];  Service: Gastroenterology;  Laterality: N/A;   FRACTURE SURGERY     left ankle plate   HERNIA REPAIR     inguinal   KNEE SURGERY Right    x 4   SHOULDER SURGERY Bilateral    x 2   VASECTOMY  Social History   Socioeconomic History   Marital status: Married    Spouse name: [name]   Number of children: 2   Years of education: Not on file   Highest education level: High school graduate  Occupational History    Comment: disability  Tobacco Use   Smoking status: Every Day    Types: Cigarettes   Smokeless tobacco: Current    Types: Snuff  Vaping Use   Vaping Use: Never used  Substance and Sexual Activity   Alcohol use: Not Currently   Drug use: Never   Sexual activity: Not on file  Other  Topics Concern   Not on file  Social History Narrative   Lives with wife   Social Determinants of Health   Financial Resource Strain: Not on file  Food Insecurity: No Food Insecurity (08/17/2022)   Hunger Vital Sign    Worried About Running Out of Food in the Last Year: Never true    Ran Out of Food in the Last Year: Never true  Transportation Needs: No Transportation Needs (08/17/2022)   PRAPARE - Hydrologist (Medical): No    Lack of Transportation (Non-Medical): No  Physical Activity: Not on file  Stress: Not on file  Social Connections: Not on file    Family History  Problem Relation Age of Onset   Pulmonary fibrosis Mother    Hypertension Father    Other Father        liver failure   Diabetes Brother    Breast cancer Paternal Aunt    Colon cancer Neg Hx    Esophageal cancer Neg Hx    Stomach cancer Neg Hx    Rectal cancer Neg Hx     Health Maintenance  Topic Date Due   INFLUENZA VACCINE  11/21/2022 (Originally 03/23/2022)   COVID-19 Vaccine (4 - 2023-24 season) 03/01/2023 (Originally 04/23/2022)   DTaP/Tdap/Td (3 - Td or Tdap) 06/20/2029   COLONOSCOPY (Pts 45-70yr Insurance coverage will need to be confirmed)  09/25/2031   Hepatitis C Screening  Completed   HIV Screening  Completed   Zoster Vaccines- Shingrix  Completed   HPV VACCINES  Aged Out     ----------------------------------------------------------------------------------------------------------------------------------------------------------------------------------------------------------------- Physical Exam BP (!) 101/56 (BP Location: Left Arm, Patient Position: Sitting, Cuff Size: Normal)   Pulse 90   Ht 5' 3"$  (1.6 m)   Wt 176 lb (79.8 kg)   SpO2 97%   BMI 31.18 kg/m   Physical Exam Constitutional:      Appearance: Normal appearance.  HENT:     Head: Normocephalic and atraumatic.  Skin:    Comments: There is 2+ lower extremity edema bilaterally.  Small mount of  weeping around the ankles.  There is several excoriated areas with scabbing and dried blood.  There is signs of infection at this time.  Skin appears xerotic.  Psychiatric:        Mood and Affect: Mood normal.        Behavior: Behavior normal.     ------------------------------------------------------------------------------------------------------------------------------------------------------------------------------------------------------------------- Assessment and Plan  Bilateral lower extremity edema Furosemide recently added.  We discussed that we will need to keep a close eye on his potassium while taking this.  [facility]  Bilateral legs were cleansed today with saline and chlorhexidine.  Wrapped with Unna boot bilaterally.  Return in 7 to 10 days to reevaluate.   No orders of the defined types were placed in this encounter.   Return in  about 1 week (around 10/19/2022) for F/u Swelling in legs.    This visit occurred during the SARS-CoV-2 public health emergency.  Safety protocols were in place, including screening questions prior to the visit, additional usage of staff PPE, and extensive cleaning of exam room while observing appropriate contact time as indicated for disinfecting solutions.

## 2022-10-12 NOTE — Telephone Encounter (Signed)
Patient already scheduled this morning at 10:50 with Dr. Zigmund Daniel.

## 2022-10-12 NOTE — Assessment & Plan Note (Signed)
Furosemide recently added.  We discussed that we will need to keep a close eye on his potassium while taking this.  Potassium was trending up on recent labs at [facility].  Bilateral legs were cleansed today with saline and chlorhexidine.  Wrapped with Unna boot bilaterally.  Return in 7 to 10 days to reevaluate.

## 2022-10-19 ENCOUNTER — Other Ambulatory Visit (HOSPITAL_BASED_OUTPATIENT_CLINIC_OR_DEPARTMENT_OTHER): Payer: Self-pay

## 2022-10-19 ENCOUNTER — Ambulatory Visit (INDEPENDENT_AMBULATORY_CARE_PROVIDER_SITE_OTHER): Payer: Commercial Managed Care - PPO | Admitting: Family Medicine

## 2022-10-19 ENCOUNTER — Encounter: Payer: Self-pay | Admitting: Family Medicine

## 2022-10-19 VITALS — BP 101/62 | HR 77 | Ht 63.0 in | Wt 170.0 lb

## 2022-10-19 DIAGNOSIS — R600 Localized edema: Secondary | ICD-10-CM

## 2022-10-19 DIAGNOSIS — L299 Pruritus, unspecified: Secondary | ICD-10-CM | POA: Diagnosis not present

## 2022-10-19 MED ORDER — HYDROXYZINE HCL 25 MG PO TABS
12.5000 mg | ORAL_TABLET | Freq: Three times a day (TID) | ORAL | 1 refills | Status: DC | PRN
  Filled 2022-10-19: qty 30, 10d supply, fill #0

## 2022-10-19 NOTE — Patient Instructions (Addendum)
Try vistaril as needed for itching.  Leave wraps on for 7-10 days and return.  We'll try adding topical steroid at next visit to help with continued itching.  You can also try Sarna Anti-itch lotion.

## 2022-10-19 NOTE — Assessment & Plan Note (Signed)
Unna boot reapplied today.  Some improvement after the first week.  Adding vistaril for itching.  Recommended that he try 1/2 tab initially.  Can consider adding topical triamcinolone at next visit if itching persists.

## 2022-10-19 NOTE — Progress Notes (Signed)
Juan [name] - 57 y.o. male MRN [id number]  Date of birth: Sep 12, 1965  Subjective Chief Complaint  Patient presents with   Leg Swelling    HPI Juan [name] is a 56 y.o. here today for follow up of leg itching and edema.  Wrapped with b/l Unna boot about 1 week ago.  Swelling improved.  Still having itching of the legs.  Denies pain.  Excoriations are improving.  He has been using benadryl cream at times.  He has no fever or chills.  No further bleeding of the legs.    ROS:  A comprehensive ROS was completed and negative except as noted per HPI  Allergies  Allergen Reactions   Apple Juice Swelling    Tongue swelling   Cucumber Extract Itching and Nausea And Vomiting    No extracts; just cucumber   Depakote Er [Divalproex Sodium Er] Swelling    Tongue swelling   Depakote [Valproic Acid] Anaphylaxis   Peanut Butter Flavor Anaphylaxis and Swelling   Peanut Oil Swelling   Peanut-Containing Drug Products Swelling   Shellfish Allergy Itching and Swelling   Shrimp Extract Allergy Skin Test Itching and Swelling   Strawberry Extract Nausea And Vomiting and Swelling   Apple Swelling   Cantaloupe Extract Allergy Skin Test Rash   Codeine Itching and Rash    Patient reports he can take "CODONES" without problems   Firvanq [Vancomycin] Rash   Lactose Intolerance (Gi) Diarrhea and Other (See Comments)    Indigestion  Stomach pain Flatulence    Past Medical History:  Diagnosis Date   Alcohol abuse 01/11/2022   Alcohol addiction ([place])    Anxiety    Bleeding internal hemorrhoids 08/18/2022   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   Chronic fatigue    Cirrhosis (H[facility])    Colon polyps    Depression    GERD (gastroesophageal reflux disease)    Hemochromatosis    Hiatal hernia 02/20/2022   Hx of blood clots    Leg   Hyperreflexia    Hypertension    PTSD (post-traumatic stress disorder)    Traumatic hemorrhagic shock [facility])     Past Surgical History:  Procedure  Laterality Date   BIOPSY  09/24/2021   Procedure: BIOPSY;  Surgeon: Sharyn Creamer, MD;  Location: [facility] ENDOSCOPY;  Service: Gastroenterology;;   Wilmon Pali RELEASE Bilateral    COLONOSCOPY WITH PROPOFOL N/A 09/24/2021   Procedure: COLONOSCOPY WITH PROPOFOL;  Surgeon: Sharyn Creamer, MD;  Location: [facility] ENDOSCOPY;  Service: Gastroenterology;  Laterality: N/A;   ESOPHAGOGASTRODUODENOSCOPY (EGD) WITH PROPOFOL N/A 09/24/2021   Procedure: ESOPHAGOGASTRODUODENOSCOPY (EGD) WITH PROPOFOL;  Surgeon: Sharyn Creamer, MD;  Location: [place];  Service: Gastroenterology;  Laterality: N/A;   ESOPHAGOGASTRODUODENOSCOPY (EGD) WITH PROPOFOL N/A 08/18/2022   Procedure: ESOPHAGOGASTRODUODENOSCOPY (EGD) WITH PROPOFOL;  Surgeon: Irene Shipper, MD;  Location: [facility] ENDOSCOPY;  Service: Gastroenterology;  Laterality: N/A;   FLEXIBLE SIGMOIDOSCOPY N/A 08/18/2022   Procedure: FLEXIBLE SIGMOIDOSCOPY;  Surgeon: Irene Shipper, MD;  Location: [facility] ENDOSCOPY;  Service: Gastroenterology;  Laterality: N/A;   FRACTURE SURGERY     left ankle plate   HERNIA REPAIR     inguinal   KNEE SURGERY Right    x 4   SHOULDER SURGERY Bilateral    x 2   VASECTOMY      Social History   Socioeconomic History   Marital status: Married    Spouse name: [name]   Number of children: 2   Years of education: Not on file  Highest education level: High school graduate  Occupational History    Comment: disability  Tobacco Use   Smoking status: Every Day    Types: Cigarettes   Smokeless tobacco: Current    Types: Snuff  Vaping Use   Vaping Use: Never used  Substance and Sexual Activity   Alcohol use: Not Currently   Drug use: Never   Sexual activity: Not on file  Other Topics Concern   Not on file  Social History Narrative   Lives with wife   Social Determinants of Health   Financial Resource Strain: Not on file  Food Insecurity: No Food Insecurity (08/17/2022)   Hunger Vital Sign    Worried About Running Out of Food in the  Last Year: Never true    Ran Out of Food in the Last Year: Never true  Transportation Needs: No Transportation Needs (08/17/2022)   PRAPARE - Hydrologist (Medical): No    Lack of Transportation (Non-Medical): No  Physical Activity: Not on file  Stress: Not on file  Social Connections: Not on file    Family History  Problem Relation Age of Onset   Pulmonary fibrosis Mother    Hypertension Father    Other Father        liver failure   Diabetes Brother    Breast cancer Paternal Aunt    Colon cancer Neg Hx    Esophageal cancer Neg Hx    Stomach cancer Neg Hx    Rectal cancer Neg Hx     Health Maintenance  Topic Date Due   INFLUENZA VACCINE  11/21/2022 (Originally 03/23/2022)   COVID-19 Vaccine (4 - 2023-24 season) 03/01/2023 (Originally 04/23/2022)   DTaP/Tdap/Td (3 - Td or Tdap) 06/20/2029   COLONOSCOPY (Pts 45-2yr Insurance coverage will need to be confirmed)  09/25/2031   Hepatitis C Screening  Completed   HIV Screening  Completed   Zoster Vaccines- Shingrix  Completed   HPV VACCINES  Aged Out     ----------------------------------------------------------------------------------------------------------------------------------------------------------------------------------------------------------------- Physical Exam BP 101/62 (BP Location: Left Arm, Patient Position: Sitting, Cuff Size: Normal)   Pulse 77   Ht 5' 3"$  (1.6 m)   Wt 170 lb (77.1 kg)   SpO2 98%   BMI 30.11 kg/m   Physical Exam Constitutional:      Appearance: Normal appearance.  HENT:     Head: Normocephalic and atraumatic.  Eyes:     General: No scleral icterus. Musculoskeletal:     Comments: Trace edema. Excoriated areas scabbed over, no signs of infection.   Skin:    General: Skin is warm.  Neurological:     Mental Status: He is alert.  Psychiatric:        Mood and Affect: Mood normal.        Behavior: Behavior normal.      ------------------------------------------------------------------------------------------------------------------------------------------------------------------------------------------------------------------- Assessment and Plan  Bilateral lower extremity edema Unna boot reapplied today.  Some improvement after the first week.  Adding vistaril for itching.  Recommended that he try 1/2 tab initially.  Can consider adding topical triamcinolone at next visit if itching persists.    Meds ordered this encounter  Medications   hydrOXYzine (ATARAX) 25 MG tablet    Sig: Take 0.5-1 tablets (12.5-25 mg total) by mouth every 8 (eight) hours as needed for itching.    Dispense:  30 tablet    Refill:  1    Return in about 1 week (around 10/26/2022) for F/u Leg swelling.    This visit occurred  during the SARS-CoV-2 public health emergency.  Safety protocols were in place, including screening questions prior to the visit, additional usage of staff PPE, and extensive cleaning of exam room while observing appropriate contact time as indicated for disinfecting solutions.

## 2022-10-20 ENCOUNTER — Telehealth: Payer: Self-pay | Admitting: Nurse Practitioner

## 2022-10-20 NOTE — Telephone Encounter (Signed)
Dr. [name], I received a call from [facility] lab indicating you ordered BMP but this patient is not admitted in the hospital.  Pls have your nurse contact the patient if you are wanting laboratory studies done.

## 2022-10-27 ENCOUNTER — Ambulatory Visit: Payer: Non-veteran care | Admitting: Family Medicine

## 2022-10-27 ENCOUNTER — Telehealth: Payer: Self-pay | Admitting: Family Medicine

## 2022-10-27 NOTE — Telephone Encounter (Signed)
Patient called today @ 10:55 to say he missed his appointment because he overslept

## 2022-10-28 ENCOUNTER — Telehealth: Payer: Self-pay

## 2022-10-28 ENCOUNTER — Ambulatory Visit: Payer: Non-veteran care | Admitting: Family Medicine

## 2022-10-28 NOTE — Telephone Encounter (Signed)
Juan [name] called this morning to cancel a unna boot follow-up appt with Dr. Madilyn Fireman since he missed his appt yesterday.   During the call he notified staff of a fall that left him feeling nauseous. CMA Laural Eiland took the call. Pt states he fell and hit the back of his head and side. He stated he felt serious kidney pain, head pain, and a little bump at the back of the head. Pt's words were slurring so he was advised to contact his spouse. Pt agreed to contact his spouse and granted permission for Marvin Maenza to contact her as well.   Pt's spouse has been contacted.

## 2022-10-28 NOTE — Telephone Encounter (Signed)
Pt spouse spoke to [place]. He is refusing to go the ER. Suggested Urgent Care.   Pt refuses services. States he just wants to sleep, per spouse.

## 2022-10-29 ENCOUNTER — Ambulatory Visit (INDEPENDENT_AMBULATORY_CARE_PROVIDER_SITE_OTHER): Payer: Commercial Managed Care - PPO

## 2022-10-29 ENCOUNTER — Other Ambulatory Visit (HOSPITAL_BASED_OUTPATIENT_CLINIC_OR_DEPARTMENT_OTHER): Payer: Self-pay

## 2022-10-29 ENCOUNTER — Ambulatory Visit (INDEPENDENT_AMBULATORY_CARE_PROVIDER_SITE_OTHER): Payer: Commercial Managed Care - PPO | Admitting: Family Medicine

## 2022-10-29 VITALS — BP 103/62 | HR 74 | Ht 63.0 in | Wt 170.0 lb

## 2022-10-29 DIAGNOSIS — W19XXXA Unspecified fall, initial encounter: Secondary | ICD-10-CM

## 2022-10-29 DIAGNOSIS — S0990XA Unspecified injury of head, initial encounter: Secondary | ICD-10-CM

## 2022-10-29 DIAGNOSIS — R0781 Pleurodynia: Secondary | ICD-10-CM

## 2022-10-29 DIAGNOSIS — R4182 Altered mental status, unspecified: Secondary | ICD-10-CM

## 2022-10-29 DIAGNOSIS — D696 Thrombocytopenia, unspecified: Secondary | ICD-10-CM | POA: Diagnosis not present

## 2022-10-29 DIAGNOSIS — S2241XA Multiple fractures of ribs, right side, initial encounter for closed fracture: Secondary | ICD-10-CM | POA: Diagnosis not present

## 2022-10-29 MED ORDER — TRIAMCINOLONE ACETONIDE 0.1 % EX CREA
1.0000 | TOPICAL_CREAM | Freq: Two times a day (BID) | CUTANEOUS | 0 refills | Status: DC
  Filled 2022-10-29: qty 454, 30d supply, fill #0

## 2022-10-29 NOTE — Progress Notes (Signed)
Pt here to have Unna boots removed and legs checked. Pt's wraps were removed and appeared to be healing from previous scratches.   Pt fell yesterday called and notified staff of his falling. He was advised to seek emergent care. Pt declined.   Pt stated that he was sore,and bruised from where he had fallen on yesterday. He denies any LOC. His wife is here with him today and stated that yesterday he did have some slurred speech however, he hasn't been slurring his speech today. I did notice that he was yawning a lot and asked if this was common. His wife stated that this started after his fall yesterday.

## 2022-10-29 NOTE — Patient Instructions (Addendum)
Try triamcinolone cream on legs. You may also use sarna anti-itch. Add compression stockings.

## 2022-10-29 NOTE — Progress Notes (Unsigned)
Juan [name] - 57 y.o. male MRN [id number]  Date of birth: 11/19/1965  Subjective No chief complaint on file.   HPI Juan [name] is a 57 y.o. male here today for nurse visit initially to have Unna boot removed.  Reports having a fall into the corner of a wall yesterday.  He had gotten up in the middle of the night and was on his way back to bed.  Reports that it was dark but he did have some dizziness as well.  He did hit his head and R ribs.  He had some slurred speech yesterday.  Advised to get evaluated in ED but refused.  He reports that slurred speech has resolved today.  He does feel much more tired than usual.  He does have headache as well as R sided rib pain.  Denies shortness of breath or dyspnea.    ROS:  A comprehensive ROS was completed and negative except as noted per HPI  Allergies  Allergen Reactions   Apple Juice Swelling    Tongue swelling   Cucumber Extract Itching and Nausea And Vomiting    No extracts; just cucumber   Depakote Er [Divalproex Sodium Er] Swelling    Tongue swelling   Depakote [Valproic Acid] Anaphylaxis   Peanut Butter Flavor Anaphylaxis and Swelling   Peanut Oil Swelling   Peanut-Containing Drug Products Swelling   Shellfish Allergy Itching and Swelling   Shrimp Extract Itching and Swelling   Strawberry Extract Nausea And Vomiting and Swelling   Apple Swelling   Cantaloupe Extract Allergy Skin Test Rash   Codeine Itching and Rash    Patient reports he can take "CODONES" without problems   Firvanq [Vancomycin] Rash   Lactose Intolerance (Gi) Diarrhea and Other (See Comments)    Indigestion  Stomach pain Flatulence    Past Medical History:  Diagnosis Date   Alcohol abuse 01/11/2022   Alcohol addiction ([place])    Anxiety    Bleeding internal hemorrhoids 08/18/2022   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   Chronic fatigue    Cirrhosis (HCC)    Colon polyps    Depression    GERD (gastroesophageal reflux disease)     Hemochromatosis    Hiatal hernia 02/20/2022   Hx of blood clots    Leg   Hyperreflexia    Hypertension    PTSD (post-traumatic stress disorder)    Traumatic hemorrhagic shock [facility])     Past Surgical History:  Procedure Laterality Date   BIOPSY  09/24/2021   Procedure: BIOPSY;  Surgeon: Sharyn Creamer, MD;  Location: [facility] ENDOSCOPY;  Service: Gastroenterology;;   Wilmon Pali RELEASE Bilateral    COLONOSCOPY WITH PROPOFOL N/A 09/24/2021   Procedure: COLONOSCOPY WITH PROPOFOL;  Surgeon: Sharyn Creamer, MD;  Location[facility] ENDOSCOPY;  Service: Gastroenterology;  Laterality: N/A;   ESOPHAGOGASTRODUODENOSCOPY (EGD) WITH PROPOFOL N/A 09/24/2021   Procedure: ESOPHAGOGASTRODUODENOSCOPY (EGD) WITH PROPOFOL;  Surgeon: Sharyn Creamer, MD;  Location: [place];  Service: Gastroenterology;  Laterality: N/A;   ESOPHAGOGASTRODUODENOSCOPY (EGD) WITH PROPOFOL N/A 08/18/2022   Procedure: ESOPHAGOGASTRODUODENOSCOPY (EGD) WITH PROPOFOL;  Surgeon: Irene Shipper, MD;  Location: [facility] ENDOSCOPY;  Service: Gastroenterology;  Laterality: N/A;   FLEXIBLE SIGMOIDOSCOPY N/A 08/18/2022   Procedure: FLEXIBLE SIGMOIDOSCOPY;  Surgeon: Irene Shipper, MD;  Location: [facility] ENDOSCOPY;  Service: Gastroenterology;  Laterality: N/A;   FRACTURE SURGERY     left ankle plate   HERNIA REPAIR     inguinal   KNEE SURGERY Right    x  4   SHOULDER SURGERY Bilateral    x 2   VASECTOMY      Social History   Socioeconomic History   Marital status: Married    Spouse name: [name]   Number of children: 2   Years of education: Not on file   Highest education level: High school graduate  Occupational History    Comment: disability  Tobacco Use   Smoking status: Every Day    Types: Cigarettes   Smokeless tobacco: Current    Types: Snuff  Vaping Use   Vaping Use: Never used  Substance and Sexual Activity   Alcohol use: Not Currently   Drug use: Never   Sexual activity: Not on file  Other Topics Concern   Not on file  Social  History Narrative   Lives with wife   Social Determinants of Health   Financial Resource Strain: Not on file  Food Insecurity: No Food Insecurity (08/17/2022)   Hunger Vital Sign    Worried About Running Out of Food in the Last Year: Never true    Ran Out of Food in the Last Year: Never true  Transportation Needs: No Transportation Needs (08/17/2022)   PRAPARE - Hydrologist (Medical): No    Lack of Transportation (Non-Medical): No  Physical Activity: Not on file  Stress: Not on file  Social Connections: Not on file    Family History  Problem Relation Age of Onset   Pulmonary fibrosis Mother    Hypertension Father    Other Father        liver failure   Diabetes Brother    Breast cancer Paternal Aunt    Colon cancer Neg Hx    Esophageal cancer Neg Hx    Stomach cancer Neg Hx    Rectal cancer Neg Hx     Health Maintenance  Topic Date Due   INFLUENZA VACCINE  11/21/2022 (Originally 03/23/2022)   COVID-19 Vaccine (4 - 2023-24 season) 03/01/2023 (Originally 04/23/2022)   DTaP/Tdap/Td (3 - Td or Tdap) 06/20/2029   COLONOSCOPY (Pts 45-34yr Insurance coverage will need to be confirmed)  09/25/2031   Hepatitis C Screening  Completed   HIV Screening  Completed   Zoster Vaccines- Shingrix  Completed   HPV VACCINES  Aged Out     ----------------------------------------------------------------------------------------------------------------------------------------------------------------------------------------------------------------- Physical Exam BP 103/62   Pulse 74   Ht '5\' 3"'$  (1.6 m)   Wt 170 lb (77.1 kg)   SpO2 98%   BMI 30.11 kg/m   Physical Exam Constitutional:      Appearance: Normal appearance.  HENT:     Head: Normocephalic and atraumatic.     Comments: No bruising or hematoma noted. Eyes:     General: No scleral icterus. Cardiovascular:     Rate and Rhythm: Normal rate and regular rhythm.  Pulmonary:     Effort: Pulmonary  effort is normal.     Breath sounds: Normal breath sounds.  Chest:     Chest wall: Tenderness (Tenderness along right lower ribs) present.  Musculoskeletal:     Cervical back: Normal range of motion.  Lymphadenopathy:     Cervical: No cervical adenopathy.  Neurological:     Mental Status: He is alert.  Psychiatric:        Mood and Affect: Mood normal.        Behavior: Behavior normal.     ------------------------------------------------------------------------------------------------------------------------------------------------------------------------------------------------------------------- Assessment and Plan  Head injury He did have some ulcerations of his mental status as well as  slurred speech yesterday.  These seem transient and likely has more of a concussive type syndrome.  However, given his history of significant thrombocytopenia I ordered a stat noncontrast CT of the head.  Rib pain on right side X-rays of the chest with right rib detail ordered.   Meds ordered this encounter  Medications   triamcinolone cream (KENALOG) 0.1 %    Sig: Apply 1 Application topically 2 (two) times daily.    Dispense:  454 g    Refill:  0    No follow-ups on file.    This visit occurred during the SARS-CoV-2 public health emergency.  Safety protocols were in place, including screening questions prior to the visit, additional usage of staff PPE, and extensive cleaning of exam room while observing appropriate contact time as indicated for disinfecting solutions.

## 2022-10-31 ENCOUNTER — Encounter: Payer: Self-pay | Admitting: Family Medicine

## 2022-10-31 DIAGNOSIS — R0781 Pleurodynia: Secondary | ICD-10-CM

## 2022-10-31 DIAGNOSIS — S0990XA Unspecified injury of head, initial encounter: Secondary | ICD-10-CM | POA: Insufficient documentation

## 2022-10-31 HISTORY — DX: Unspecified injury of head, initial encounter: S09.90XA

## 2022-10-31 HISTORY — DX: Pleurodynia: R07.81

## 2022-10-31 NOTE — Assessment & Plan Note (Signed)
X-rays of the chest with right rib detail ordered.

## 2022-10-31 NOTE — Assessment & Plan Note (Signed)
He did have some ulcerations of his mental status as well as slurred speech yesterday.  These seem transient and likely has more of a concussive type syndrome.  However, given his history of significant thrombocytopenia I ordered a stat noncontrast CT of the head.

## 2022-11-02 LAB — COMPREHENSIVE METABOLIC PANEL
ALT: 17 IU/L (ref 0–44)
AST: 61 IU/L — ABNORMAL HIGH (ref 0–40)
Albumin/Globulin Ratio: 0.6 — ABNORMAL LOW (ref 1.2–2.2)
Albumin: 2.5 g/dL — ABNORMAL LOW (ref 3.8–4.9)
Alkaline Phosphatase: 95 IU/L (ref 44–121)
BUN/Creatinine Ratio: 9 (ref 9–20)
BUN: 7 mg/dL (ref 6–24)
Bilirubin Total: 2.7 mg/dL — ABNORMAL HIGH (ref 0.0–1.2)
CO2: 20 mmol/L (ref 20–29)
Calcium: 8.2 mg/dL — ABNORMAL LOW (ref 8.7–10.2)
Chloride: 105 mmol/L (ref 96–106)
Creatinine, Ser: 0.74 mg/dL — ABNORMAL LOW (ref 0.76–1.27)
Globulin, Total: 4.4 g/dL (ref 1.5–4.5)
Glucose: 105 mg/dL — ABNORMAL HIGH (ref 70–99)
Potassium: 3.2 mmol/L — ABNORMAL LOW (ref 3.5–5.2)
Sodium: 142 mmol/L (ref 134–144)
Total Protein: 6.9 g/dL (ref 6.0–8.5)
eGFR: 107 mL/min/{1.73_m2} (ref >59)

## 2022-11-02 LAB — CBC WITH DIFFERENTIAL/PLATELET
Basophils Absolute: 0.1 10*3/uL (ref 0.0–0.2)
Basos: 2 %
EOS (ABSOLUTE): 0.3 10*3/uL (ref 0.0–0.4)
Eos: 8 %
Hematocrit: 25.5 % — ABNORMAL LOW (ref 37.5–51.0)
Hemoglobin: 8.3 g/dL — ABNORMAL LOW (ref 13.0–17.7)
Immature Grans (Abs): 0 10*3/uL (ref 0.0–0.1)
Immature Granulocytes: 0 %
Lymphocytes Absolute: 1.5 10*3/uL (ref 0.7–3.1)
Lymphs: 38 %
MCH: 26.9 pg (ref 26.6–33.0)
MCHC: 32.5 g/dL (ref 31.5–35.7)
MCV: 83 fL (ref 79–97)
Monocytes Absolute: 0.6 10*3/uL (ref 0.1–0.9)
Monocytes: 16 %
Neutrophils Absolute: 1.5 10*3/uL (ref 1.4–7.0)
Neutrophils: 36 %
Platelets: 50 10*3/uL — CL (ref 150–450)
RBC: 3.09 x10E6/uL — ABNORMAL LOW (ref 4.14–5.80)
RDW: 20.6 % — ABNORMAL HIGH (ref 11.6–15.4)
WBC: 4 10*3/uL (ref 3.4–10.8)

## 2022-11-02 LAB — MAGNESIUM: Magnesium: 1.3 mg/dL — ABNORMAL LOW (ref 1.6–2.3)

## 2022-11-04 ENCOUNTER — Ambulatory Visit (INDEPENDENT_AMBULATORY_CARE_PROVIDER_SITE_OTHER): Payer: Commercial Managed Care - PPO | Admitting: Family Medicine

## 2022-11-04 VITALS — BP 121/58 | HR 79

## 2022-11-04 DIAGNOSIS — Z23 Encounter for immunization: Secondary | ICD-10-CM

## 2022-11-04 NOTE — Progress Notes (Signed)
Medical screening examination/treatment was performed by qualified clinical staff member and as supervising physician I was immediately available for consultation/collaboration. I have reviewed documentation and agree with assessment and plan.  Lee-Ann Gal, DO  

## 2022-11-04 NOTE — Progress Notes (Signed)
Patient is here for a Hep B vaccine. Verified no previous allergy to vaccine. Hep B injection to right deltoid with no apparent complications. Patient advised to call with any problems.

## 2022-11-10 ENCOUNTER — Ambulatory Visit: Payer: Non-veteran care | Admitting: Family Medicine

## 2022-11-15 ENCOUNTER — Encounter: Payer: Self-pay | Admitting: Family Medicine

## 2022-11-15 ENCOUNTER — Other Ambulatory Visit (HOSPITAL_BASED_OUTPATIENT_CLINIC_OR_DEPARTMENT_OTHER): Payer: Self-pay

## 2022-11-15 ENCOUNTER — Ambulatory Visit (INDEPENDENT_AMBULATORY_CARE_PROVIDER_SITE_OTHER): Payer: Commercial Managed Care - PPO | Admitting: Family Medicine

## 2022-11-15 VITALS — BP 120/82 | HR 98 | Temp 98.7°F | Resp 18 | Ht 63.0 in | Wt 167.0 lb

## 2022-11-15 DIAGNOSIS — J019 Acute sinusitis, unspecified: Secondary | ICD-10-CM | POA: Diagnosis not present

## 2022-11-15 DIAGNOSIS — E876 Hypokalemia: Secondary | ICD-10-CM

## 2022-11-15 DIAGNOSIS — B9689 Other specified bacterial agents as the cause of diseases classified elsewhere: Secondary | ICD-10-CM

## 2022-11-15 HISTORY — DX: Other specified bacterial agents as the cause of diseases classified elsewhere: B96.89

## 2022-11-15 MED ORDER — DOXYCYCLINE HYCLATE 100 MG PO TABS
100.0000 mg | ORAL_TABLET | Freq: Two times a day (BID) | ORAL | 0 refills | Status: AC
  Filled 2022-11-15: qty 14, 7d supply, fill #0

## 2022-11-15 NOTE — Assessment & Plan Note (Signed)
-  Have gone ahead and ordered blood work

## 2022-11-15 NOTE — Assessment & Plan Note (Addendum)
-   Due to symptoms greater than 1 week as well as feeling better and then getting worse with fever coupled with sinus congestion and pain, I believe this is acute bacterial sinusitis.  I will go ahead and treat with doxycycline.  Doxycycline does not go through the liver. Nosebleeds are secondary to dryness in his naris.  Recommended over-the-counter nasal saline spray.

## 2022-11-15 NOTE — Progress Notes (Signed)
Established patient visit   Patient: Juan [name]   DOB: 04-21-1966   57 y.o. Male  MRN: [id number] Visit Date: 11/15/2022  Today's healthcare provider: Owens Loffler, DO   Chief Complaint  Patient presents with   Cough    Pt c/o cough, sneezing nausea and nosebleeds pt tested negative for Covid 1wk ago    SUBJECTIVE    Chief Complaint  Patient presents with   Cough    Pt c/o cough, sneezing nausea and nosebleeds pt tested negative for Covid 1wk ago   HPI HPI     Cough    Additional comments: Pt c/o cough, sneezing nausea and nosebleeds pt tested negative for Covid 1wk ago      Last edited by Silvano Rusk, CMA on 11/15/2022  9:20 AM.      Patient presents with concerns of sinus congestion as well as runny nose and watery eyes.  Have been present for over a week.  He did end up having a fever over the weekend after he thought he was getting better.  Does admit to sinus pain.  Also admits to nosebleeds.  Review of Systems  Constitutional:  Negative for activity change, fatigue and fever.  HENT:  Positive for congestion and sinus pain.   Respiratory:  Negative for cough and shortness of breath.   Cardiovascular:  Negative for chest pain.  Gastrointestinal:  Negative for abdominal pain.  Genitourinary:  Negative for difficulty urinating.      Current Meds  Medication Sig   busPIRone (BUSPAR) 15 MG tablet Take 15 mg by mouth 2 (two) times daily.   doxycycline (VIBRA-TABS) 100 MG tablet Take 1 tablet (100 mg total) by mouth 2 (two) times daily for 7 days.   hydrocortisone (ANUSOL-HC) 25 MG suppository Place 1 suppository (25 mg total) rectally 2 (two) times daily. (Patient taking differently: Place 25 mg rectally 2 (two) times daily as needed for hemorrhoids or anal itching.)   hydrOXYzine (ATARAX) 25 MG tablet Take 0.5-1 tablets (12.5-25 mg total) by mouth every 8 (eight) hours as needed for itching.   lactulose, encephalopathy, (CHRONULAC) 10 GM/15ML SOLN  Take 10 g by mouth daily as needed (constipation).   Magnesium Chloride 64 MG TABS Take 1 tablet by mouth in the morning and at bedtime.   ondansetron (ZOFRAN-ODT) 8 MG disintegrating tablet Dissolve 1 tablet (8 mg total) by mouth every 8 (eight) hours as needed for nausea.   pantoprazole (PROTONIX) 40 MG tablet Take 1 tablet (40 mg total) by mouth daily.   potassium chloride SA (KLOR-CON M) 20 MEQ tablet Take 1 tablet (20 mEq total) by mouth 2 (two) times daily.   prochlorperazine (COMPAZINE) 10 MG tablet Take 1 tablet (10 mg total) by mouth every 6 (six) hours as needed for nausea or vomiting.   saccharomyces boulardii (FLORASTOR) 250 MG capsule Take 250 mg by mouth 2 (two) times daily.    triamcinolone cream (KENALOG) 0.1 % Apply 1 Application topically 2 (two) times daily.   vortioxetine HBr (TRINTELLIX) 10 MG TABS tablet Take 1 tablet (10 mg total) by mouth daily.    OBJECTIVE    BP 120/82   Pulse 98   Temp 98.7 F (37.1 C) (Oral)   Resp 18   Ht 5\' 3"  (1.6 m)   Wt 167 lb (75.8 kg)   SpO2 100%   BMI 29.58 kg/m   Physical Exam Vitals and nursing note reviewed.  Constitutional:      General: He  is not in acute distress.    Appearance: Normal appearance.  HENT:     Head: Normocephalic and atraumatic.     Comments: Nontender to palpation of frontal sinuses bilaterally. Tenderness to palpation of maxillary sinuses bilaterally.    Right Ear: External ear normal.     Left Ear: External ear normal.     Nose: Nose normal.  Eyes:     Conjunctiva/sclera: Conjunctivae normal.  Cardiovascular:     Rate and Rhythm: Normal rate and regular rhythm.  Pulmonary:     Effort: Pulmonary effort is normal.     Breath sounds: Normal breath sounds.  Neurological:     General: No focal deficit present.     Mental Status: He is alert and oriented to person, place, and time.  Psychiatric:        Mood and Affect: Mood normal.        Behavior: Behavior normal.        Thought Content: Thought  content normal.        Judgment: Judgment normal.        ASSESSMENT & PLAN    Problem List Items Addressed This Visit       Respiratory   Acute bacterial sinusitis - Primary    - Due to symptoms greater than 1 week as well as feeling better and then getting worse with fever coupled with sinus congestion and pain, I believe this is acute bacterial sinusitis.  I will go ahead and treat with doxycycline.  Doxycycline does not go through the liver. Nosebleeds are secondary to dryness in his naris.  Recommended over-the-counter nasal saline spray.      Relevant Medications   doxycycline (VIBRA-TABS) 100 MG tablet     Other   Hypokalemia    - Following potassium level      Relevant Orders   BASIC METABOLIC PANEL WITH GFR   Basic Metabolic Panel (BMET)   Hypomagnesemia    -Have gone ahead and ordered blood work      Relevant Orders   Magnesium   Magnesium        Meds ordered this encounter  Medications   doxycycline (VIBRA-TABS) 100 MG tablet    Sig: Take 1 tablet (100 mg total) by mouth 2 (two) times daily for 7 days.    Dispense:  14 tablet    Refill:  0    Orders Placed This Encounter  Procedures   BASIC METABOLIC PANEL WITH GFR   Magnesium   Basic Metabolic Panel (BMET)   Magnesium     Owens Loffler, [place] at [facility] [phone] (phone) [phone] (fax)  [place]

## 2022-11-15 NOTE — Assessment & Plan Note (Signed)
-   Following potassium level

## 2022-11-16 ENCOUNTER — Other Ambulatory Visit (HOSPITAL_BASED_OUTPATIENT_CLINIC_OR_DEPARTMENT_OTHER): Payer: Self-pay

## 2022-11-16 LAB — BASIC METABOLIC PANEL
BUN/Creatinine Ratio: 19 (ref 9–20)
BUN: 14 mg/dL (ref 6–24)
CO2: 21 mmol/L (ref 20–29)
Calcium: 9.4 mg/dL (ref 8.7–10.2)
Chloride: 104 mmol/L (ref 96–106)
Creatinine, Ser: 0.74 mg/dL — ABNORMAL LOW (ref 0.76–1.27)
Glucose: 91 mg/dL (ref 70–99)
Potassium: 3.4 mmol/L — ABNORMAL LOW (ref 3.5–5.2)
Sodium: 138 mmol/L (ref 134–144)
eGFR: 107 mL/min/{1.73_m2} (ref >59)

## 2022-11-16 LAB — MAGNESIUM: Magnesium: 1.3 mg/dL — ABNORMAL LOW (ref 1.6–2.3)

## 2022-11-18 ENCOUNTER — Other Ambulatory Visit: Payer: Self-pay

## 2022-11-18 ENCOUNTER — Other Ambulatory Visit: Payer: Self-pay | Admitting: Family Medicine

## 2022-11-18 ENCOUNTER — Other Ambulatory Visit (HOSPITAL_BASED_OUTPATIENT_CLINIC_OR_DEPARTMENT_OTHER): Payer: Self-pay

## 2022-11-18 ENCOUNTER — Other Ambulatory Visit: Payer: Self-pay | Admitting: Internal Medicine

## 2022-11-18 MED ORDER — BUSPIRONE HCL 15 MG PO TABS
15.0000 mg | ORAL_TABLET | Freq: Two times a day (BID) | ORAL | 2 refills | Status: DC
  Filled 2022-11-18: qty 180, 90d supply, fill #0

## 2022-11-18 MED ORDER — PANTOPRAZOLE SODIUM 40 MG PO TBEC
40.0000 mg | DELAYED_RELEASE_TABLET | Freq: Every day | ORAL | 1 refills | Status: DC
  Filled 2022-11-18: qty 30, 30d supply, fill #0
  Filled 2023-03-10: qty 30, 30d supply, fill #1

## 2022-11-19 ENCOUNTER — Other Ambulatory Visit: Payer: Self-pay

## 2022-11-19 ENCOUNTER — Other Ambulatory Visit (HOSPITAL_BASED_OUTPATIENT_CLINIC_OR_DEPARTMENT_OTHER): Payer: Self-pay

## 2022-11-22 ENCOUNTER — Other Ambulatory Visit (HOSPITAL_BASED_OUTPATIENT_CLINIC_OR_DEPARTMENT_OTHER): Payer: Self-pay

## 2022-11-22 MED ORDER — VORTIOXETINE HBR 10 MG PO TABS
10.0000 mg | ORAL_TABLET | Freq: Every day | ORAL | 3 refills | Status: DC
  Filled 2022-11-22: qty 30, 30d supply, fill #0
  Filled 2023-03-10: qty 30, 30d supply, fill #1

## 2022-11-23 ENCOUNTER — Other Ambulatory Visit (HOSPITAL_BASED_OUTPATIENT_CLINIC_OR_DEPARTMENT_OTHER): Payer: Self-pay

## 2022-11-25 DIAGNOSIS — F411 Generalized anxiety disorder: Secondary | ICD-10-CM | POA: Diagnosis not present

## 2022-11-25 DIAGNOSIS — G4700 Insomnia, unspecified: Secondary | ICD-10-CM | POA: Diagnosis not present

## 2022-11-25 DIAGNOSIS — F1021 Alcohol dependence, in remission: Secondary | ICD-10-CM | POA: Diagnosis not present

## 2022-11-26 DIAGNOSIS — R351 Nocturia: Secondary | ICD-10-CM | POA: Diagnosis not present

## 2022-11-26 DIAGNOSIS — G7100 Muscular dystrophy, unspecified: Secondary | ICD-10-CM | POA: Diagnosis not present

## 2022-11-26 DIAGNOSIS — R32 Unspecified urinary incontinence: Secondary | ICD-10-CM | POA: Diagnosis not present

## 2022-11-26 DIAGNOSIS — F17220 Nicotine dependence, chewing tobacco, uncomplicated: Secondary | ICD-10-CM | POA: Diagnosis not present

## 2022-12-03 DIAGNOSIS — G729 Myopathy, unspecified: Secondary | ICD-10-CM | POA: Diagnosis not present

## 2022-12-03 DIAGNOSIS — R200 Anesthesia of skin: Secondary | ICD-10-CM | POA: Diagnosis not present

## 2022-12-03 DIAGNOSIS — G609 Hereditary and idiopathic neuropathy, unspecified: Secondary | ICD-10-CM | POA: Diagnosis not present

## 2022-12-06 ENCOUNTER — Encounter: Payer: Self-pay | Admitting: *Deleted

## 2022-12-07 ENCOUNTER — Other Ambulatory Visit (HOSPITAL_BASED_OUTPATIENT_CLINIC_OR_DEPARTMENT_OTHER): Payer: Self-pay

## 2022-12-07 ENCOUNTER — Telehealth: Payer: Self-pay

## 2022-12-07 NOTE — Transitions of Care (Post Inpatient/ED Visit) (Unsigned)
   12/07/2022  Name: Juan [name] MRN: [id number] DOB: 1966-04-25  Today's TOC FU Call Status: Today's TOC FU Call Status:: Unsuccessul Call (1st Attempt) Unsuccessful Call (1st Attempt) Date: 12/07/22  Attempted to reach the patient regarding the most recent Inpatient/ED visit.  Follow Up Plan: Additional outreach attempts will be made to reach the patient to complete the Transitions of Care (Post Inpatient/ED visit) call.   Signature Karena Addison, LPN [facility] Nurse Health Advisor Direct Dial [phone]

## 2022-12-08 DIAGNOSIS — G7100 Muscular dystrophy, unspecified: Secondary | ICD-10-CM | POA: Diagnosis not present

## 2022-12-08 NOTE — Transitions of Care (Post Inpatient/ED Visit) (Signed)
   12/08/2022  Name: Juan [name] MRN: [id number] DOB: [birth year]  Today's TOC FU Call Status: Today's TOC FU Call Status:: Successful TOC FU Call Competed Unsuccessful Call (1st Attempt) Date: 12/07/22 [facility] FU Call Complete Date: 12/08/22  Transition Care Management Follow-up Telephone Call Date of Discharge: 12/06/22 Discharge Facility: Other ([facility]) Name of Other ([facility]) Discharge Facility: [facility] Type of Discharge: Inpatient Admission Primary Inpatient Discharge Diagnosis:: alcohol cirrhosis of liver How have you been since you were released from the hospital?: Better Any questions or concerns?: Yes Patient Questions/Concerns:: doesn't need TOC, but just wants to know when he needs to come in for regular follow up Patient Questions/Concerns Addressed: Notified Provider of Patient Questions/Concerns  Items Reviewed: Did you receive and understand the discharge instructions provided?: Yes Medications obtained and verified?: Yes (Medications Reviewed) Any new allergies since your discharge?: No Dietary orders reviewed?: Yes Do you have support at home?: Yes People in Home: spouse  Home Care and Equipment/Supplies: Were Home Health Services Ordered?: NA Any new equipment or medical supplies ordered?: NA  Functional Questionnaire: Do you need assistance with bathing/showering or dressing?: No Do you need assistance with meal preparation?: No Do you need assistance with eating?: No Do you have difficulty maintaining continence: No Do you need assistance with getting out of bed/getting out of a chair/moving?: No Do you have difficulty managing or taking your medications?: No  Follow up appointments reviewed: PCP Follow-up appointment confirmed?: NA Specialist Hospital Follow-up appointment confirmed?: NA Do you need transportation to your follow-up appointment?: No Do you understand care options if your condition(s) worsen?: Yes-patient verbalized  understanding    SIGNATURE Karena Addison, LPN [facility] Nurse Health Advisor Direct Dial [phone]

## 2022-12-21 DIAGNOSIS — G7100 Muscular dystrophy, unspecified: Secondary | ICD-10-CM | POA: Diagnosis not present

## 2022-12-27 ENCOUNTER — Ambulatory Visit: Payer: Commercial Managed Care - PPO | Admitting: Sports Medicine

## 2023-01-03 ENCOUNTER — Other Ambulatory Visit (HOSPITAL_BASED_OUTPATIENT_CLINIC_OR_DEPARTMENT_OTHER): Payer: Self-pay

## 2023-01-03 DIAGNOSIS — H17812 Minor opacity of cornea, left eye: Secondary | ICD-10-CM | POA: Diagnosis not present

## 2023-01-03 DIAGNOSIS — S00212A Abrasion of left eyelid and periocular area, initial encounter: Secondary | ICD-10-CM | POA: Diagnosis not present

## 2023-01-03 MED ORDER — TOBRAMYCIN-DEXAMETHASONE 0.3-0.1 % OP SUSP
1.0000 [drp] | Freq: Four times a day (QID) | OPHTHALMIC | 0 refills | Status: DC
  Filled 2023-01-03: qty 5, 12d supply, fill #0

## 2023-01-04 ENCOUNTER — Ambulatory Visit (INDEPENDENT_AMBULATORY_CARE_PROVIDER_SITE_OTHER): Payer: Commercial Managed Care - PPO | Admitting: Sports Medicine

## 2023-01-04 ENCOUNTER — Ambulatory Visit (INDEPENDENT_AMBULATORY_CARE_PROVIDER_SITE_OTHER): Payer: Commercial Managed Care - PPO

## 2023-01-04 ENCOUNTER — Other Ambulatory Visit (INDEPENDENT_AMBULATORY_CARE_PROVIDER_SITE_OTHER): Payer: Commercial Managed Care - PPO

## 2023-01-04 DIAGNOSIS — Z09 Encounter for follow-up examination after completed treatment for conditions other than malignant neoplasm: Secondary | ICD-10-CM

## 2023-01-04 DIAGNOSIS — M1731 Unilateral post-traumatic osteoarthritis, right knee: Secondary | ICD-10-CM

## 2023-01-04 DIAGNOSIS — M1711 Unilateral primary osteoarthritis, right knee: Secondary | ICD-10-CM | POA: Diagnosis not present

## 2023-01-04 DIAGNOSIS — G7100 Muscular dystrophy, unspecified: Secondary | ICD-10-CM | POA: Diagnosis not present

## 2023-01-04 DIAGNOSIS — M1712 Unilateral primary osteoarthritis, left knee: Secondary | ICD-10-CM | POA: Diagnosis not present

## 2023-01-04 HISTORY — DX: Unilateral post-traumatic osteoarthritis, right knee: M17.31

## 2023-01-04 NOTE — Assessment & Plan Note (Signed)
Pleasant 57 year old male, multiple right knee surgeries including ACL reconstruction and meniscectomy, increasing pain right medial knee with swelling, positive McMurray's sign on exam. Injected today, adding x-rays, MRI, return to see me for MRI results in at least 6 weeks

## 2023-01-04 NOTE — Progress Notes (Signed)
    Procedures performed today:    Procedure: Real-time Ultrasound Guided injection of the right knee Device: Samsung HS60  Verbal informed consent obtained.  Time-out conducted.  Noted no overlying erythema, induration, or other signs of local infection.  Skin prepped in a sterile fashion.  Local anesthesia: Topical Ethyl chloride.  With sterile technique and under real time ultrasound guidance: Effusion noted, 1 cc Kenalog 40, 2 cc lidocaine, 2 cc bupivacaine injected easily Completed without difficulty  Advised to call if fevers/chills, erythema, induration, drainage, or persistent bleeding.  Images permanently stored and available for review in PACS.  Impression: Technically successful ultrasound guided injection.  Independent interpretation of notes and tests performed by another provider:   None.  Brief History, Exam, Impression, and Recommendations:    Post-traumatic osteoarthritis of right knee Pleasant 57 year old male, multiple right knee surgeries including ACL reconstruction and meniscectomy, increasing pain right medial knee with swelling, positive McMurray's sign on exam. Injected today, adding x-rays, MRI, return to see me for MRI results in at least 6 weeks    ____________________________________________ Ihor Austin. Benjamin Stain, M.D., ABFM., CAQSM., AME. Primary Care and Sports Medicine [facility]  Adjunct Professor of Family Medicine  [employer] [facility] [facility]  Restaurant manager, fast food

## 2023-01-05 ENCOUNTER — Ambulatory Visit: Payer: Commercial Managed Care - PPO | Admitting: Family Medicine

## 2023-01-06 ENCOUNTER — Ambulatory Visit: Payer: Commercial Managed Care - PPO | Admitting: Family Medicine

## 2023-01-07 DIAGNOSIS — H17812 Minor opacity of cornea, left eye: Secondary | ICD-10-CM | POA: Diagnosis not present

## 2023-01-09 ENCOUNTER — Ambulatory Visit (INDEPENDENT_AMBULATORY_CARE_PROVIDER_SITE_OTHER): Payer: Commercial Managed Care - PPO

## 2023-01-09 DIAGNOSIS — M25461 Effusion, right knee: Secondary | ICD-10-CM | POA: Diagnosis not present

## 2023-01-09 DIAGNOSIS — M1731 Unilateral post-traumatic osteoarthritis, right knee: Secondary | ICD-10-CM

## 2023-01-12 ENCOUNTER — Encounter: Payer: Self-pay | Admitting: Family Medicine

## 2023-01-12 ENCOUNTER — Ambulatory Visit (INDEPENDENT_AMBULATORY_CARE_PROVIDER_SITE_OTHER): Payer: Commercial Managed Care - PPO | Admitting: Family Medicine

## 2023-01-12 VITALS — BP 106/63 | HR 72 | Ht 63.0 in | Wt 167.0 lb

## 2023-01-12 DIAGNOSIS — N644 Mastodynia: Secondary | ICD-10-CM | POA: Diagnosis not present

## 2023-01-12 DIAGNOSIS — N63 Unspecified lump in unspecified breast: Secondary | ICD-10-CM

## 2023-01-12 HISTORY — DX: Unspecified lump in unspecified breast: N63.0

## 2023-01-12 NOTE — Assessment & Plan Note (Signed)
Checking prolactin, estradiol and testosterone levels.  He is on Aldactone which may be causing this.  Diagnostic mammogram ordered.

## 2023-01-12 NOTE — Progress Notes (Signed)
Juan [name] - 57 y.o. male MRN [id number]  Date of birth: 1965/11/10  Subjective Chief Complaint  Patient presents with   Breast Pain    HPI Juan [name] is a 57 y.o. male here today with concern of breast mass and nipple tenderness.  He has had increasing nipple tenderness over the past several weeks and noticed a couple nodules around the nipple area.  He has not had any nipple discharge.  Denies any swelling in the axilla.  There is no significant family history of breast cancer.  ROS:  A comprehensive ROS was completed and negative except as noted per HPI   Allergies  Allergen Reactions   Apple Juice Swelling    Tongue swelling   Cucumber Extract Itching and Nausea And Vomiting    No extracts; just cucumber   Depakote Er [Divalproex Sodium Er] Swelling    Tongue swelling   Depakote [Valproic Acid] Anaphylaxis   Peanut Butter Flavor Anaphylaxis and Swelling   Peanut Oil Swelling   Peanut-Containing Drug Products Swelling   Shellfish Allergy Itching and Swelling   Shrimp Extract Itching and Swelling   Strawberry Extract Nausea And Vomiting and Swelling   Apple Swelling   Cantaloupe Extract Allergy Skin Test Rash   Codeine Itching and Rash    Patient reports he can take "CODONES" without problems   Firvanq [Vancomycin] Rash   Lactose Intolerance (Gi) Diarrhea and Other (See Comments)    Indigestion  Stomach pain Flatulence    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   Chronic fatigue    Cirrhosis (HCC)    Colon polyps    Depression    GERD (gastroesophageal reflux disease)    Hemochromatosis    Hiatal hernia 02/20/2022   Hx of blood clots    Leg   Hyperreflexia    Hypertension    PTSD (post-traumatic stress disorder)    Traumatic hemorrhagic shock [facility])     Past Surgical History:  Procedure  Laterality Date   BIOPSY  09/24/2021   Procedure: BIOPSY;  Surgeon: Imogene Burn, MD;  Location: [facility] ENDOSCOPY;  Service: Gastroenterology;;   Fidela Salisbury RELEASE Bilateral    COLONOSCOPY WITH PROPOFOL N/A 09/24/2021   Procedure: COLONOSCOPY WITH PROPOFOL;  Surgeon: Imogene Burn, MD;  Location: [facility] ENDOSCOPY;  Service: Gastroenterology;  Laterality: N/A;   ESOPHAGOGASTRODUODENOSCOPY (EGD) WITH PROPOFOL N/A 09/24/2021   Procedure: ESOPHAGOGASTRODUODENOSCOPY (EGD) WITH PROPOFOL;  Surgeon: Imogene Burn, MD;  Location: [facility] ENDOSCOPY;  Service: Gastroenterology;  Laterality: N/A;   ESOPHAGOGASTRODUODENOSCOPY (EGD) WITH PROPOFOL N/A 08/18/2022   Procedure: ESOPHAGOGASTRODUODENOSCOPY (EGD) WITH PROPOFOL;  Surgeon: Hilarie Fredrickson, MD;  Location: [facility] ENDOSCOPY;  Service: Gastroenterology;  Laterality: N/A;   FLEXIBLE SIGMOIDOSCOPY N/A 08/18/2022   Procedure: FLEXIBLE SIGMOIDOSCOPY;  Surgeon: Hilarie Fredrickson, MD;  Location: [facility] ENDOSCOPY;  Service: Gastroenterology;  Laterality: N/A;   FRACTURE SURGERY     left ankle plate   HERNIA REPAIR     inguinal   KNEE SURGERY Right    x 4   SHOULDER SURGERY Bilateral    x 2   VASECTOMY      Social History   Socioeconomic History   Marital status: Married    Spouse name: [name]   Number of children: 2   Years of education: Not on file   Highest education level: 12th grade  Occupational History    Comment: disability  Tobacco Use   Smoking status: Every Day    Types: Cigarettes   Smokeless tobacco: Current    Types: Snuff  Vaping Use   Vaping Use: Never used  Substance and Sexual Activity   Alcohol use: Not Currently   Drug use: Never   Sexual activity: Not on file  Other Topics Concern   Not on file  Social History Narrative   Lives with wife   Social Determinants of Health   Financial Resource Strain: Medium Risk (01/09/2023)   Overall Financial Resource Strain (CARDIA)    Difficulty of Paying Living Expenses: Somewhat hard  Food  Insecurity: No Food Insecurity (01/09/2023)   Hunger Vital Sign    Worried About Running Out of Food in the Last Year: Never true    Ran Out of Food in the Last Year: Never true  Transportation Needs: No Transportation Needs (01/09/2023)   PRAPARE - Administrator, Civil Service (Medical): No    Lack of Transportation (Non-Medical): No  Physical Activity: Insufficiently Active (01/09/2023)   Exercise Vital Sign    Days of Exercise per Week: 1 day    Minutes of Exercise per Session: 10 min  Stress: Stress Concern Present (01/09/2023)   [employer] of Occupational Health - Occupational Stress Questionnaire    Feeling of Stress : To some extent  Social Connections: Moderately Integrated (01/09/2023)   Social Connection and Isolation Panel [NHANES]    Frequency of Communication with Friends and Family: More than three times a week    Frequency of Social Gatherings with Friends and Family: Once a week    Attends Religious Services: More than 4 times per year    Active Member of Clubs or Organizations: No    Attends Engineer, structural: Not on file    Marital Status: Married    Family History  Problem Relation Age of Onset   Pulmonary fibrosis Mother    Hypertension Father    Other Father        liver failure   Diabetes Brother    Breast cancer Paternal Aunt    Colon cancer Neg Hx    Esophageal cancer Neg Hx    Stomach cancer Neg Hx    Rectal cancer Neg Hx     Health Maintenance  Topic Date Due   COVID-19 Vaccine (4 - 2023-24 season) 03/01/2023 (Originally 04/23/2022)   INFLUENZA VACCINE  03/24/2023   DTaP/Tdap/Td (3 - Td or Tdap) 06/20/2029   COLONOSCOPY (Pts 45-48yrs Insurance coverage will need to be confirmed)  09/25/2031   Hepatitis C Screening  Completed   HIV Screening  Completed   Zoster Vaccines- Shingrix  Completed   HPV VACCINES  Aged Out      ----------------------------------------------------------------------------------------------------------------------------------------------------------------------------------------------------------------- Physical Exam BP 106/63 (BP Location: Right Arm, Patient Position: Sitting, Cuff Size: Normal)   Pulse 72   Ht 5\' 3"  (1.6 m)   Wt 167 lb (75.8 kg)   SpO2 99%   BMI 29.58 kg/m   Physical Exam Constitutional:      Appearance: Normal appearance.  Chest:  Breasts:    Right: Mass and tenderness present.     Left: Mass and tenderness present.     Comments: Small nodules around the nipples bilaterally. Neurological:     Mental Status: He is alert.     ------------------------------------------------------------------------------------------------------------------------------------------------------------------------------------------------------------------- Assessment and Plan  Breast nodule Checking prolactin, estradiol and testosterone levels.  He is on Aldactone which may be  causing this.  Diagnostic mammogram ordered.   No orders of the defined types were placed in this encounter.   No follow-ups on file.    This visit occurred during the SARS-CoV-2 public health emergency.  Safety protocols were in place, including screening questions prior to the visit, additional usage of staff PPE, and extensive cleaning of exam room while observing appropriate contact time as indicated for disinfecting solutions.  She reports

## 2023-01-13 ENCOUNTER — Encounter: Payer: Self-pay | Admitting: Sports Medicine

## 2023-01-13 LAB — ESTRADIOL: Estradiol: 42 pg/mL — ABNORMAL HIGH (ref <=39)

## 2023-01-13 LAB — PROLACTIN: Prolactin: 19 ng/mL — ABNORMAL HIGH (ref 2.0–18.0)

## 2023-01-13 LAB — TESTOSTERONE: Testosterone: 186 ng/dL — ABNORMAL LOW (ref 250–827)

## 2023-01-14 ENCOUNTER — Other Ambulatory Visit: Payer: Self-pay | Admitting: Family Medicine

## 2023-01-14 DIAGNOSIS — N644 Mastodynia: Secondary | ICD-10-CM

## 2023-01-14 DIAGNOSIS — N63 Unspecified lump in unspecified breast: Secondary | ICD-10-CM

## 2023-01-19 ENCOUNTER — Other Ambulatory Visit: Payer: Self-pay

## 2023-01-19 ENCOUNTER — Encounter (HOSPITAL_BASED_OUTPATIENT_CLINIC_OR_DEPARTMENT_OTHER): Payer: Self-pay | Admitting: Emergency Medicine

## 2023-01-19 ENCOUNTER — Ambulatory Visit: Payer: Commercial Managed Care - PPO | Admitting: Family Medicine

## 2023-01-19 ENCOUNTER — Observation Stay (HOSPITAL_BASED_OUTPATIENT_CLINIC_OR_DEPARTMENT_OTHER)
Admission: EM | Admit: 2023-01-19 | Discharge: 2023-01-20 | Disposition: A | Discharge status: Home / Self Care | Payer: No Typology Code available for payment source | Attending: Internal Medicine | Admitting: Internal Medicine

## 2023-01-19 DIAGNOSIS — Z79899 Other long term (current) drug therapy: Secondary | ICD-10-CM | POA: Diagnosis not present

## 2023-01-19 DIAGNOSIS — Z91010 Allergy to peanuts: Secondary | ICD-10-CM | POA: Insufficient documentation

## 2023-01-19 DIAGNOSIS — K7460 Unspecified cirrhosis of liver: Secondary | ICD-10-CM | POA: Diagnosis present

## 2023-01-19 DIAGNOSIS — D696 Thrombocytopenia, unspecified: Secondary | ICD-10-CM | POA: Diagnosis present

## 2023-01-19 DIAGNOSIS — E871 Hypo-osmolality and hyponatremia: Secondary | ICD-10-CM | POA: Diagnosis not present

## 2023-01-19 DIAGNOSIS — F411 Generalized anxiety disorder: Secondary | ICD-10-CM | POA: Diagnosis present

## 2023-01-19 DIAGNOSIS — E876 Hypokalemia: Secondary | ICD-10-CM | POA: Diagnosis present

## 2023-01-19 DIAGNOSIS — I4581 Long QT syndrome: Secondary | ICD-10-CM | POA: Insufficient documentation

## 2023-01-19 DIAGNOSIS — R040 Epistaxis: Principal | ICD-10-CM | POA: Diagnosis present

## 2023-01-19 DIAGNOSIS — K709 Alcoholic liver disease, unspecified: Secondary | ICD-10-CM | POA: Diagnosis present

## 2023-01-19 DIAGNOSIS — F332 Major depressive disorder, recurrent severe without psychotic features: Secondary | ICD-10-CM | POA: Diagnosis present

## 2023-01-19 DIAGNOSIS — R9431 Abnormal electrocardiogram [ECG] [EKG]: Secondary | ICD-10-CM | POA: Diagnosis present

## 2023-01-19 DIAGNOSIS — I10 Essential (primary) hypertension: Secondary | ICD-10-CM | POA: Diagnosis present

## 2023-01-19 DIAGNOSIS — D649 Anemia, unspecified: Secondary | ICD-10-CM | POA: Diagnosis present

## 2023-01-19 LAB — BASIC METABOLIC PANEL
Anion gap: 9 (ref 5–15)
BUN: 13 mg/dL (ref 6–20)
CO2: 21 mmol/L — ABNORMAL LOW (ref 22–32)
Calcium: 9.5 mg/dL (ref 8.9–10.3)
Chloride: 101 mmol/L (ref 98–111)
Creatinine, Ser: 0.79 mg/dL (ref 0.61–1.24)
GFR, Estimated: >60 mL/min (ref >60)
Glucose, Bld: 121 mg/dL — ABNORMAL HIGH (ref 70–99)
Potassium: 2.7 mmol/L — CL (ref 3.5–5.1)
Sodium: 131 mmol/L — ABNORMAL LOW (ref 135–145)

## 2023-01-19 LAB — ETHANOL: Alcohol, Ethyl (B): <10 mg/dL (ref <10)

## 2023-01-19 LAB — CBC
HCT: 26.9 % — ABNORMAL LOW (ref 39.0–52.0)
Hemoglobin: 8.7 g/dL — ABNORMAL LOW (ref 13.0–17.0)
MCH: 26.8 pg (ref 26.0–34.0)
MCHC: 32.3 g/dL (ref 30.0–36.0)
MCV: 82.8 fL (ref 80.0–100.0)
Platelets: 55 10*3/uL — ABNORMAL LOW (ref 150–400)
RBC: 3.25 MIL/uL — ABNORMAL LOW (ref 4.22–5.81)
RDW: 22.1 % — ABNORMAL HIGH (ref 11.5–15.5)
WBC: 6 10*3/uL (ref 4.0–10.5)
nRBC: 0 % (ref 0.0–0.2)

## 2023-01-19 LAB — APTT: aPTT: 39 seconds — ABNORMAL HIGH (ref 24–36)

## 2023-01-19 LAB — HEPATIC FUNCTION PANEL
ALT: 22 U/L (ref 0–44)
AST: 58 U/L — ABNORMAL HIGH (ref 15–41)
Albumin: 2.7 g/dL — ABNORMAL LOW (ref 3.5–5.0)
Alkaline Phosphatase: 74 U/L (ref 38–126)
Bilirubin, Direct: 1.3 mg/dL — ABNORMAL HIGH (ref 0.0–0.2)
Indirect Bilirubin: 2.9 mg/dL — ABNORMAL HIGH (ref 0.3–0.9)
Total Bilirubin: 4.2 mg/dL — ABNORMAL HIGH (ref 0.3–1.2)
Total Protein: 6.4 g/dL — ABNORMAL LOW (ref 6.5–8.1)

## 2023-01-19 LAB — AMMONIA: Ammonia: 32 umol/L (ref 9–35)

## 2023-01-19 LAB — MAGNESIUM: Magnesium: 1.1 mg/dL — ABNORMAL LOW (ref 1.7–2.4)

## 2023-01-19 LAB — PROTIME-INR
INR: 2 — ABNORMAL HIGH (ref 0.8–1.2)
Prothrombin Time: 23.2 seconds — ABNORMAL HIGH (ref 11.4–15.2)

## 2023-01-19 MED ORDER — PROCHLORPERAZINE MALEATE 10 MG PO TABS: 10.0000 mg | ORAL_TABLET | Freq: Four times a day (QID) | ORAL | Status: DC | PRN

## 2023-01-19 MED ORDER — ONDANSETRON 4 MG PO TBDP
8.0000 mg | ORAL_TABLET | Freq: Once | ORAL | Status: AC
  Administered 2023-01-19: 8 mg via ORAL
  Filled 2023-01-19: qty 2

## 2023-01-19 MED ORDER — SODIUM CHLORIDE 0.9 % IV SOLN: INTRAVENOUS | Status: DC | PRN

## 2023-01-19 MED ORDER — POTASSIUM CHLORIDE CRYS ER 20 MEQ PO TBCR
40.0000 meq | EXTENDED_RELEASE_TABLET | Freq: Once | ORAL | Status: AC
  Administered 2023-01-19: 40 meq via ORAL
  Filled 2023-01-19: qty 2

## 2023-01-19 MED ORDER — BUSPIRONE HCL 5 MG PO TABS
15.0000 mg | ORAL_TABLET | Freq: Two times a day (BID) | ORAL | Status: DC
  Administered 2023-01-19 – 2023-01-20 (×2): 15 mg via ORAL
  Filled 2023-01-19 (×2): qty 1

## 2023-01-19 MED ORDER — PANTOPRAZOLE SODIUM 40 MG PO TBEC
40.0000 mg | DELAYED_RELEASE_TABLET | Freq: Every day | ORAL | Status: DC
  Administered 2023-01-19 – 2023-01-20 (×2): 40 mg via ORAL
  Filled 2023-01-19 (×2): qty 1

## 2023-01-19 MED ORDER — ACETAMINOPHEN 325 MG PO TABS: 650.0000 mg | ORAL_TABLET | Freq: Four times a day (QID) | ORAL | Status: DC | PRN

## 2023-01-19 MED ORDER — POTASSIUM CHLORIDE 10 MEQ/100ML IV SOLN: 10.0000 meq | INTRAVENOUS | Status: DC

## 2023-01-19 MED ORDER — MAGNESIUM SULFATE 2 GM/50ML IV SOLN
2.0000 g | Freq: Once | INTRAVENOUS | Status: AC
  Administered 2023-01-19: 2 g via INTRAVENOUS
  Filled 2023-01-19: qty 50

## 2023-01-19 MED ORDER — ACETAMINOPHEN 650 MG RE SUPP: 650.0000 mg | Freq: Four times a day (QID) | RECTAL | Status: DC | PRN

## 2023-01-19 MED ORDER — LACTULOSE ENCEPHALOPATHY 10 GM/15ML PO SOLN: 10.0000 g | Freq: Every day | ORAL | Status: DC | PRN

## 2023-01-19 MED ORDER — LIDOCAINE-EPINEPHRINE (PF) 2 %-1:200000 IJ SOLN: 10.0000 mL | Freq: Once | INTRAMUSCULAR | Status: DC

## 2023-01-19 MED ORDER — MAGNESIUM SULFATE 4 GM/100ML IV SOLN: 4.0000 g | Freq: Once | INTRAVENOUS | Status: DC

## 2023-01-19 MED ORDER — SODIUM CHLORIDE 0.9 % IV BOLUS: 1000.0000 mL | Freq: Once | INTRAVENOUS | Status: DC

## 2023-01-19 MED ORDER — SODIUM CHLORIDE 0.9 % IV SOLN: INTRAVENOUS | Status: AC

## 2023-01-19 MED ORDER — POTASSIUM CHLORIDE 20 MEQ PO PACK
40.0000 meq | PACK | Freq: Once | ORAL | Status: AC
  Administered 2023-01-19: 40 meq via ORAL
  Filled 2023-01-19: qty 2

## 2023-01-19 MED ORDER — POTASSIUM CHLORIDE 10 MEQ/100ML IV SOLN
10.0000 meq | Freq: Once | INTRAVENOUS | Status: AC
  Administered 2023-01-19: 10 meq via INTRAVENOUS
  Filled 2023-01-19: qty 100

## 2023-01-19 MED ORDER — FUROSEMIDE 40 MG PO TABS: 40.0000 mg | ORAL_TABLET | Freq: Every day | ORAL | Status: DC

## 2023-01-19 MED ORDER — MAGNESIUM OXIDE -MG SUPPLEMENT 400 (240 MG) MG PO TABS
400.0000 mg | ORAL_TABLET | Freq: Once | ORAL | Status: AC
  Administered 2023-01-19: 400 mg via ORAL
  Filled 2023-01-19: qty 1

## 2023-01-19 MED ORDER — DOCUSATE SODIUM 100 MG PO CAPS
100.0000 mg | ORAL_CAPSULE | Freq: Two times a day (BID) | ORAL | Status: DC
  Administered 2023-01-20: 100 mg via ORAL
  Filled 2023-01-19 (×2): qty 1

## 2023-01-19 MED ORDER — OXYMETAZOLINE HCL 0.05 % NA SOLN: 1.0000 | Freq: Once | NASAL | Status: DC

## 2023-01-19 NOTE — Progress Notes (Signed)
Plan of Care Note for accepted transfer   Patient: Juan [name] MRN: [id number]   DOA: 01/19/2023  Facility [facility].  Requesting Provider: Sherian Maroon, PA-C. Reason for transfer: Hypokalemia, hypomagnesemia and prolonged QTc. Facility course:  Per EDP: "Chief Complaint  Patient presents with   Nausea   Epistaxis  Juan [name] is a 57 y.o. male.   Epistaxis    57 year old male presents emergency department with complaints of nausea and nosebleed.  Patient states he has a history of low platelets with spontaneous bleeding in the past.  Patient states that nosebleed began on Monday without aggravating factor.  Patient states that bleeding lasted for approximately 6 hours before he was able to get this to stop with direct pressure.  States his last time this happened his platelets were low.  Reports history of cyclical vomiting syndrome with intermittent feelings of nausea.  States that he has been feeling nauseous since Sunday which she took Zofran at that time with relief of symptoms.  Reports still persistent mild feelings of nausea.  States he contacted his primary care to set an appointment but his primary care told him to come to the emergency department given history of significant thrombocytopenia with bleeding.  Denies any known rash.  Denies fever, chills, night sweats, chest pain, shortness of breath, nausea, vomiting, urinary symptoms, change in bowel habits, medic easier/melena, appreciable rash.   Past medical history significant for thrombocytopenia, chronic alcoholic myopathy, chronic fatigue, alcohol induced cirrhosis, GERD, hemochromatosis, bleeding internal hemorrhoid, hypertension, major depressive disorder, pancytopenia, hypokalemia, hypomagnesemia"  Lab work: Ethanol [[id number]]   Collected: 01/19/23 1215   Updated: 01/19/23 1235   Specimen Type: Blood   Specimen Source: Vein    Alcohol, Ethyl (B) <10 mg/dL  Ammonia [[id number]]    Collected: 01/19/23 1215   Updated: 01/19/23 1232   Specimen Type: Blood   Specimen Source: Vein    Ammonia 32 umol/L  Hepatic function panel [[id number]] (Abnormal)   Collected: 01/19/23 0944   Updated: 01/19/23 1212   Specimen Type: Blood    Total Protein 6.4 Low  g/dL   Albumin 2.7 Low  g/dL   AST 58 High  U/L   ALT 22 U/L   Alkaline Phosphatase 74 U/L   Total Bilirubin 4.2 High  mg/dL   Bilirubin, Direct 1.3 High  mg/dL   Indirect Bilirubin 2.9 High  mg/dL  Protime-INR [[id number]] (Abnormal)   Collected: 01/19/23 0944   Updated: 01/19/23 1205   Specimen Type: Blood    Prothrombin Time 23.2 High  seconds   INR 2.0 High   APTT [[id number]] (Abnormal)   Collected: 01/19/23 0944   Updated: 01/19/23 1205   Specimen Type: Blood    aPTT 39 High  seconds  CBC [[id number]] (Abnormal)   Collected: 01/19/23 0944   Updated: 01/19/23 1118   Specimen Type: Blood   Specimen Source: Vein    WBC 6.0 K/uL   RBC 3.25 Low  MIL/uL   Hemoglobin 8.7 Low  g/dL   HCT 74.2 Low  %   MCV 82.8 fL   MCH 26.8 pg   MCHC 32.3 g/dL   RDW 59.5 High  %   Platelets 55 Low  K/uL   nRBC 0.0 %  Basic metabolic panel [[id number]] (Abnormal)   Collected: 01/19/23 0944   Updated: 01/19/23 1052   Specimen Type: Blood   Specimen Source: Vein    Sodium 131 Low  mmol/L   Potassium 2.7 Low Panic  mmol/L  Chloride 101 mmol/L   CO2 21 Low  mmol/L   Glucose, Bld 121 High  mg/dL   BUN 13 mg/dL   Creatinine, Ser 1.61 mg/dL   Calcium 9.5 mg/dL   GFR, Estimated >09 mL/min   Anion gap 9  Magnesium [[id number]] (Abnormal)   Collected: 01/19/23 0944   Updated: 01/19/23 1052   Specimen Type: Blood   Specimen Source: Vein    Magnesium 1.1 Low  mg/dL   EKG: Vent. rate 71 BPM PR interval 171 ms QRS duration 106 ms QT/QTcB 549/597 ms P-R-T axes 39 12 -1 Sinus rhythm Nonspecific repol abnormality, inferior leads Prolonged QT interval  Plan of care: The patient is accepted for admission to Telemetry  unit, at [facility].  Author: Bobette Mo, MD 01/19/2023  Check [email or web address] for on-call coverage.  Nursing staff, Please call [facility] Admits & Consults System-Wide number on Amion as soon as patient's arrival, so appropriate admitting provider can evaluate the pt.

## 2023-01-19 NOTE — ED Provider Notes (Signed)
[facility] Provider Note   CSN: [id number] Arrival date & time: 01/19/23  8469     History  Chief Complaint  Patient presents with   Nausea   Epistaxis    Juan [name] is a 57 y.o. male.   Epistaxis   57 year old male presents emergency department with complaints of nausea and nosebleed.  Patient states he has a history of low platelets with spontaneous bleeding in the past.  Patient states that nosebleed began on Monday without aggravating factor.  Patient states that bleeding lasted for approximately 6 hours before he was able to get this to stop with direct pressure.  States his last time this happened his platelets were low.  Reports history of cyclical vomiting syndrome with intermittent feelings of nausea.  States that he has been feeling nauseous since Sunday which she took Zofran at that time with relief of symptoms.  Reports still persistent mild feelings of nausea.  States he contacted his primary care to set an appointment but his primary care told him to come to the emergency department given history of significant thrombocytopenia with bleeding.  Denies any known rash.  Denies fever, chills, night sweats, chest pain, shortness of breath, nausea, vomiting, urinary symptoms, change in bowel habits, medic easier/melena, appreciable rash.  Past medical history significant for thrombocytopenia, chronic alcoholic myopathy, chronic fatigue, alcohol induced cirrhosis, GERD, hemochromatosis, bleeding internal hemorrhoid, hypertension, major depressive disorder, pancytopenia, hypokalemia, hypomagnesemia  Home Medications Prior to Admission medications   Medication Sig Start Date End Date Taking? Authorizing Provider  busPIRone (BUSPAR) 15 MG tablet Take 1 tablet (15 mg total) by mouth 2 (two) times daily. 11/18/22  Yes Everrett Coombe, DO  furosemide (LASIX) 40 MG tablet Take 1 tablet (40 mg total) by mouth daily. 09/15/22 01/19/23 Yes Lanae Boast, MD  hydrocortisone (ANUSOL-HC) 25 MG suppository Place 1 suppository (25 mg total) rectally 2 (two) times daily. Patient taking differently: Place 25 mg rectally 2 (two) times daily as needed for hemorrhoids or anal itching. 08/18/22  Yes Rhetta Mura, MD  hydrOXYzine (ATARAX) 25 MG tablet Take 0.5-1 tablets (12.5-25 mg total) by mouth every 8 (eight) hours as needed for itching. 10/19/22  Yes Everrett Coombe, DO  lactulose, encephalopathy, (CHRONULAC) 10 GM/15ML SOLN Take 10 g by mouth daily as needed (constipation).   Yes [provider]  Magnesium Chloride (MAGDELAY) 64 MG TBEC Take 1 tablet (64 mg total) by mouth in the morning and at bedtime. 09/02/22  Yes Everrett Coombe, DO  ondansetron (ZOFRAN-ODT) 8 MG disintegrating tablet Dissolve 1 tablet (8 mg total) by mouth every 8 (eight) hours as needed for nausea. Patient taking differently: Take 8 mg by mouth as needed for nausea or vomiting. 04/09/22  Yes Everrett Coombe, DO  pantoprazole (PROTONIX) 40 MG tablet Take 1 tablet (40 mg total) by mouth daily. 11/18/22 11/18/23 Yes Imogene Burn, MD  potassium chloride SA (KLOR-CON M) 20 MEQ tablet Take 1 tablet (20 mEq total) by mouth 2 (two) times daily. 09/02/22 03/02/23 Yes Everrett Coombe, DO  prochlorperazine (COMPAZINE) 10 MG tablet Take 1 tablet (10 mg total) by mouth every 6 (six) hours as needed for nausea or vomiting. Patient taking differently: Take 10 mg by mouth as needed for nausea or vomiting. 07/01/22  Yes Monica Becton, MD  saccharomyces boulardii (FLORASTOR) 250 MG capsule Take 250 mg by mouth 2 (two) times daily.    Yes [provider]  triamcinolone cream (KENALOG) 0.1 % Apply  1 Application topically 2 (two) times daily. Patient taking differently: Apply 1 Application topically as needed (flares). 10/29/22  Yes Everrett Coombe, DO  vortioxetine HBr (TRINTELLIX) 10 MG TABS tablet Take 1 tablet (10 mg total) by mouth daily. 11/22/22  Yes Everrett Coombe, DO   spironolactone (ALDACTONE) 100 MG tablet Take 1 tablet (100 mg total) by mouth daily. Patient not taking: Reported on 01/19/2023 09/15/22 01/19/23  Lanae Boast, MD  tobramycin-dexamethasone [facility]) ophthalmic solution Place 1 drop into the affected eye(s) every 6 (six) hours. Patient not taking: Reported on 01/19/2023 01/03/23         Allergies    Apple juice, Cucumber extract, Depakote er [divalproex sodium er], Depakote [valproic acid], Peanut butter flavor, Peanut oil, Peanut-containing drug products, Shellfish allergy, Shrimp extract, Strawberry extract, Apple, Cantaloupe extract allergy skin test, Codeine, Firvanq [vancomycin], and Lactose intolerance (gi)    Review of Systems   Review of Systems  HENT:  Positive for nosebleeds.   All other systems reviewed and are negative.   Physical Exam Updated Vital Signs BP 123/68 (BP Location: Left Arm)   Pulse 72   Temp 97.8 F (36.6 C) (Oral)   Resp 16   Ht 5\' 3"  (1.6 m)   Wt 75.8 kg   SpO2 100%   BMI 29.58 kg/m  Physical Exam Vitals and nursing note reviewed.  Constitutional:      General: He is not in acute distress.    Appearance: He is well-developed.  HENT:     Head: Normocephalic and atraumatic.     Nose:     Comments: Scab appreciated on left lateral nare with no active bleeding. Eyes:     Conjunctiva/sclera: Conjunctivae normal.  Cardiovascular:     Rate and Rhythm: Normal rate and regular rhythm.     Heart sounds: No murmur heard. Pulmonary:     Effort: Pulmonary effort is normal. No respiratory distress.     Breath sounds: Normal breath sounds.  Abdominal:     Palpations: Abdomen is soft.     Tenderness: There is no abdominal tenderness.  Musculoskeletal:        General: No swelling.     Cervical back: Neck supple.  Skin:    General: Skin is warm and dry.     Capillary Refill: Capillary refill takes less than 2 seconds.     Comments: No obvious petechiae or purpura on full body exam  Neurological:      Mental Status: He is alert.  Psychiatric:        Mood and Affect: Mood normal.     ED Results / Procedures / Treatments   Labs (all labs ordered are listed, but only abnormal results are displayed) Labs Reviewed  CBC - Abnormal; Notable for the following components:      Result Value   RBC 3.25 (*)    Hemoglobin 8.7 (*)    HCT 26.9 (*)    RDW 22.1 (*)    Platelets 55 (*)    All other components within normal limits  BASIC METABOLIC PANEL - Abnormal; Notable for the following components:   Sodium 131 (*)    Potassium 2.7 (*)    CO2 21 (*)    Glucose, Bld 121 (*)    All other components within normal limits  MAGNESIUM - Abnormal; Notable for the following components:   Magnesium 1.1 (*)    All other components within normal limits  HEPATIC FUNCTION PANEL - Abnormal; Notable for the following components:  Total Protein 6.4 (*)    Albumin 2.7 (*)    AST 58 (*)    Total Bilirubin 4.2 (*)    Bilirubin, Direct 1.3 (*)    Indirect Bilirubin 2.9 (*)    All other components within normal limits  PROTIME-INR - Abnormal; Notable for the following components:   Prothrombin Time 23.2 (*)    INR 2.0 (*)    All other components within normal limits  APTT - Abnormal; Notable for the following components:   aPTT 39 (*)    All other components within normal limits  ETHANOL  AMMONIA  COMPREHENSIVE METABOLIC PANEL  CBC  MAGNESIUM  PHOSPHORUS  HIV ANTIBODY (ROUTINE TESTING W REFLEX)    EKG EKG Interpretation  Date/Time:  Wednesday Jan 19 2023 11:32:55 EDT Ventricular Rate:  71 PR Interval:  171 QRS Duration: 106 QT Interval:  549 QTC Calculation: 597 R Axis:   12 Text Interpretation: Sinus rhythm Nonspecific repol abnormality, inferior leads Prolonged QT interval QT longer than prior Nonspecific ST/T changes inferior and lateral leads Confirmed by Vivien Rossetti ([id number]) on 01/19/2023 11:34:55 AM  Radiology No results found.  Procedures .Critical Care  Performed by:  Peter Garter, PA Authorized by: Peter Garter, PA   Critical care provider statement:    Critical care time (minutes):  64   Critical care was necessary to treat or prevent imminent or life-threatening deterioration of the following conditions:  Metabolic crisis   Critical care was time spent personally by me on the following activities:  Development of treatment plan with patient or surrogate, discussions with consultants, evaluation of patient's response to treatment, examination of patient, ordering and review of laboratory studies, ordering and review of radiographic studies, ordering and performing treatments and interventions, pulse oximetry, re-evaluation of patient's condition and review of old charts   I assumed direction of critical care for this patient from another provider in my specialty: no     Care discussed with: admitting provider       Medications Ordered in ED Medications  busPIRone (BUSPAR) tablet 15 mg (has no administration in time range)  prochlorperazine (COMPAZINE) tablet 10 mg (has no administration in time range)  lactulose (encephalopathy) (CHRONULAC) 10 GM/15ML solution 10 g (has no administration in time range)  pantoprazole (PROTONIX) EC tablet 40 mg (40 mg Oral Given 01/19/23 1802)  0.9 %  sodium chloride infusion ( Intravenous New Bag/Given 01/19/23 1738)  acetaminophen (TYLENOL) tablet 650 mg (has no administration in time range)    Or  acetaminophen (TYLENOL) suppository 650 mg (has no administration in time range)  docusate sodium (COLACE) capsule 100 mg (has no administration in time range)  magnesium sulfate IVPB 2 g 50 mL (2 g Intravenous New Bag/Given 01/19/23 1804)  ondansetron (ZOFRAN-ODT) disintegrating tablet 8 mg (8 mg Oral Given 01/19/23 0955)  potassium chloride SA (KLOR-CON M) CR tablet 40 mEq (40 mEq Oral Given 01/19/23 1132)  magnesium oxide (MAG-OX) tablet 400 mg (400 mg Oral Given 01/19/23 1132)  magnesium sulfate IVPB 2 g 50 mL (0 g  Intravenous Stopped 01/19/23 1424)  potassium chloride 10 mEq in 100 mL IVPB (0 mEq Intravenous Stopped 01/19/23 1322)  potassium chloride (KLOR-CON) packet 40 mEq (40 mEq Oral Given 01/19/23 1801)    ED Course/ Medical Decision Making/ A&P Clinical Course as of 01/19/23 Rickey Primus  Wed Jan 19, 2023  1309 Consulted hospitalist Dr. Robb Matar regarding the patient who agreed with admission and assume further treatment/care. [CR]    Clinical Course  User Index [CR] Peter Garter, PA                             Medical Decision Making Amount and/or Complexity of Data Reviewed Labs: ordered.  Risk OTC drugs. Prescription drug management. Decision regarding hospitalization.   This patient presents to the ED for concern of epistaxis/thrombocytopenia, this involves an extensive number of treatment options, and is a complaint that carries with it a high risk of complications and morbidity.  The differential diagnosis includes thrombocytopenia, electrolyte derangement, cardiac arrhythmia, coagulopathy   Co morbidities that complicate the patient evaluation  See HPI   Additional history obtained:  Additional history obtained from EMR External records from outside source obtained and reviewed including hospital records   Lab Tests:  I Ordered, and personally interpreted labs.  The pertinent results include: No leukocytosis.  Patient with evidence of anemia with hemoglobin 8.70 which is near patient's baseline.  Thrombocytopenia with platelets of 55 of which is also near patient's baseline.  Multiple electrolyte derangements including hyponatremia, hypokalemia and hypomagnesia of 131, 2.7 and 1.1 respectively.  No renal dysfunction.  Patient with mild elevation of AST but with total bilirubin of 4.2 which is elevated from patient's baseline.  Ethanol within normal limits.  Ammonia within normal limits.   Imaging Studies ordered:  N/a   Cardiac Monitoring: / EKG:  The patient was  maintained on a cardiac monitor.  I personally viewed and interpreted the cardiac monitored which showed an underlying rhythm of: Sinus rhythm with prolonged of QTc which is longer than prior EKG performed.  Nonspecific ST/T wave changes  Consultations Obtained:  I requested consultation with attending physician Dr.  Elpidio Anis who is in agreement with treatment plan going forward  Problem List / ED Course / Critical interventions / Medication management  Prolonged QTc, hypomagnesia, hypokalemia I ordered medication including Zofran, magnesium, potassium chloride  Reevaluation of the patient after these medicines showed that the patient improved I have reviewed the patients home medicines and have made adjustments as needed   Social Determinants of Health:  Current snuff use.  Denies illicit drug use.   Test / Admission - Considered:  Prolonged QTc, hypomagnesia, hypokalemia Vitals signs within normal range and stable throughout visit. Laboratory studies significant for: See above 57 year old male presents emergency department after nosebleed on Monday with history of thrombocytopenia.  Patient without evidence of current epistaxis with stable platelets of 55 with no evidence of petechiae, purpura or current bleed.  Patient was with evidence of significantly prolonged QTc as well as electrolytes derangement of hypokalemia and hypomagnesia requiring intravenous supplementation.  Given patient's QTc of 597 with concurrent worsening electrolyte derangements, admission was pursued for cardiac monitoring as well as continued electrolyte supplementation.  Treatment plan discussed at length with patient and he acknowledged understanding was agreeable to said plan.  Patient overall well-appearing, afebrile in no acute distress.         Final Clinical Impression(s) / ED Diagnoses Final diagnoses:  QT prolongation  Hypokalemia  Hypomagnesemia    Rx / DC Orders ED Discharge Orders      None         Peter Garter, [place] 01/19/23 Randell Loop, MD 01/19/23 [phone]

## 2023-01-19 NOTE — ED Notes (Signed)
Called lab to add hepatic function panel and PT-INR/APTT to previously collected labs

## 2023-01-19 NOTE — H&P (Signed)
History and Physical    [name] WUJ:[id number] DOB: [birth year] DOA: 01/19/2023  PCP: Everrett Coombe, DO   Patient coming from: Home  I have personally briefly reviewed patient's old medical records in [facility]  Chief Complaint: Spontaneous Nose bleeding, Nausea  HPI: Juan [name] is a 57 y.o. male with PMH significant for history of alcohol use,  alcoholic liver disease, cirrhosis of liver, thrombocytopenia, recurrent nosebleeds, depression, generalized anxiety disorder, chronic alcoholic myopathy, uses walker and cane for ambulation at baseline, presented in the ED after being referred by primary care physician for further evaluation.  Patient reports he has developed spontaneous nosebleeds on Monday which lasted for 6 hours and subsequently stopped after continuous application of pressure.  He denies any further nosebleed but continues to have nausea and persistent weakness.  He went to see his primary care physician today who was concerned that patient might have worsening of thrombocytopenia and was sent in the ED.  He denies any recent URI, fever, chest pain, shortness of breath, dizziness, palpitation, sick contacts, recent travel.  ED Course: He was hemodynamically stable. Temp 97.8, HR 72, RR 16, BP 123/68, SpO2 100% on room air Labs include sodium 131, potassium 2.7, chloride 101, bicarb 21, glucose 121, BUN 13, creatinine 0.79, calcium 9.5, anion gap 9, magnesium 1.1, alkaline phosphatase 74, albumin 2.7, AST 58, ALT 22, total protein 6.2, ammonia 32, direct bilirubin 1.3, indirect bilirubin 2.9, total bilirubin 4.2, WBC 6.0, hemoglobin is 8.7, MCV 82.8, platelet 55, PT 23.3, INR 2.0, APTT 39,  Review of Systems: Review of Systems  Constitutional: Negative.   HENT:  Positive for congestion and nosebleeds.   Eyes: Negative.   Respiratory: Negative.    Cardiovascular: Negative.   Gastrointestinal:  Positive for nausea and vomiting.  Genitourinary: Negative.    Musculoskeletal: Negative.   Skin: Negative.   Neurological:  Positive for weakness.  Endo/Heme/Allergies: Negative.   Psychiatric/Behavioral:  Positive for depression. The patient is nervous/anxious and has insomnia.     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   Chronic fatigue    Cirrhosis (HCC)    Colon polyps    Depression    GERD (gastroesophageal reflux disease)    Hemochromatosis    Hiatal hernia 02/20/2022   Hx of blood clots    Leg   Hyperreflexia    Hypertension    PTSD (post-traumatic stress disorder)    Traumatic hemorrhagic shock [facility])     Past Surgical History:  Procedure Laterality Date   BIOPSY  09/24/2021   Procedure: BIOPSY;  Surgeon: Imogene Burn, MD;  Location: [facility] ENDOSCOPY;  Service: Gastroenterology;;   Fidela Salisbury RELEASE Bilateral    COLONOSCOPY WITH PROPOFOL N/A 09/24/2021   Procedure: COLONOSCOPY WITH PROPOFOL;  Surgeon: Imogene Burn, MD;  Location: [facility] ENDOSCOPY;  Service: Gastroenterology;  Laterality: N/A;   ESOPHAGOGASTRODUODENOSCOPY (EGD) WITH PROPOFOL N/A 09/24/2021   Procedure: ESOPHAGOGASTRODUODENOSCOPY (EGD) WITH PROPOFOL;  Surgeon: Imogene Burn, MD;  Location[facility] ENDOSCOPY;  Service: Gastroenterology;  Laterality: N/A;   ESOPHAGOGASTRODUODENOSCOPY (EGD) WITH PROPOFOL N/A 08/18/2022   Procedure: ESOPHAGOGASTRODUODENOSCOPY (EGD) WITH PROPOFOL;  Surgeon: Hilarie Fredrickson, MD;  Location: [facility] ENDOSCOPY;  Service: Gastroenterology;  Laterality: N/A;   FLEXIBLE SIGMOIDOSCOPY N/A 08/18/2022   Procedure: FLEXIBLE SIGMOIDOSCOPY;  Surgeon: Hilarie Fredrickson, MD;  Location: [facility] ENDOSCOPY;  Service: Gastroenterology;  Laterality: N/A;   FRACTURE SURGERY     left ankle plate   HERNIA REPAIR     inguinal   KNEE SURGERY Right    x 4   SHOULDER SURGERY Bilateral    x 2   VASECTOMY       reports that he  has never smoked. His smokeless tobacco use includes snuff. He reports that he does not currently use alcohol. He reports that he does not use drugs.  Allergies  Allergen Reactions   Apple Juice Swelling and Other (See Comments)    Tongue swelling   Cucumber Extract Itching and Nausea And Vomiting    No extracts; just cucumber   Depakote Er [Divalproex Sodium Er] Swelling and Other (See Comments)    Tongue swelling   Depakote [Valproic Acid] Anaphylaxis   Peanut Butter Flavor Anaphylaxis and Swelling   Peanut Oil Swelling   Peanut-Containing Drug Products Swelling   Shellfish Allergy Itching and Swelling   Shrimp Extract Itching and Swelling   Strawberry Extract Nausea And Vomiting and Swelling   Apple Swelling   Cantaloupe Extract Allergy Skin Test Rash   Codeine Itching, Rash and Other (See Comments)    Patient reports he can take "CODONES" without problems   Firvanq [Vancomycin] Rash   Lactose Intolerance (Gi) Diarrhea and Other (See Comments)    Indigestion, Stomach pain, Flatulence    Family History  Problem Relation Age of Onset   Pulmonary fibrosis Mother    Hypertension Father    Other Father        liver failure   Diabetes Brother    Breast cancer Paternal Aunt    Colon cancer Neg Hx    Esophageal cancer Neg Hx    Stomach cancer Neg Hx    Rectal cancer Neg Hx    Family history reviewed and not pertinent.  Prior to Admission medications   Medication Sig Start Date End Date Taking? Authorizing Provider  busPIRone (BUSPAR) 15 MG tablet Take 1 tablet (15 mg total) by mouth 2 (two) times daily. 11/18/22  Yes Everrett Coombe, DO  furosemide (LASIX) 40 MG tablet Take 1 tablet (40 mg total) by mouth daily. 09/15/22 01/19/23 Yes Lanae Boast, MD  hydrocortisone (ANUSOL-HC) 25 MG suppository Place 1 suppository (25 mg total) rectally 2 (two) times daily. Patient taking differently: Place 25 mg rectally 2 (two) times daily as needed for hemorrhoids or anal itching. 08/18/22  Yes  Rhetta Mura, MD  hydrOXYzine (ATARAX) 25 MG tablet Take 0.5-1 tablets (12.5-25 mg total) by mouth every 8 (eight) hours as needed for itching. 10/19/22  Yes Everrett Coombe, DO  lactulose, encephalopathy, (CHRONULAC) 10 GM/15ML SOLN Take 10 g by mouth daily as needed (constipation).   Yes [provider]  Magnesium Chloride (MAGDELAY) 64 MG TBEC Take 1 tablet (64 mg total) by mouth in the morning and at bedtime. 09/02/22  Yes Everrett Coombe, DO  ondansetron (ZOFRAN-ODT) 8 MG disintegrating tablet Dissolve 1 tablet (8 mg total) by mouth every 8 (eight) hours as needed for nausea. Patient taking differently: Take 8 mg by mouth as needed for nausea or vomiting. 04/09/22  Yes Everrett Coombe, DO  pantoprazole (PROTONIX) 40 MG tablet Take 1 tablet (40 mg total) by mouth daily. 11/18/22 11/18/23 Yes Imogene Burn, MD  potassium chloride SA (KLOR-CON M) 20 MEQ tablet Take 1 tablet (20 mEq total) by mouth 2 (two) times daily. 09/02/22 03/02/23 Yes Everrett Coombe, DO  prochlorperazine (COMPAZINE) 10 MG tablet Take 1 tablet (10  mg total) by mouth every 6 (six) hours as needed for nausea or vomiting. Patient taking differently: Take 10 mg by mouth as needed for nausea or vomiting. 07/01/22  Yes Monica Becton, MD  saccharomyces boulardii (FLORASTOR) 250 MG capsule Take 250 mg by mouth 2 (two) times daily.    Yes [provider]  triamcinolone cream (KENALOG) 0.1 % Apply 1 Application topically 2 (two) times daily. Patient taking differently: Apply 1 Application topically as needed (flares). 10/29/22  Yes Everrett Coombe, DO  vortioxetine HBr (TRINTELLIX) 10 MG TABS tablet Take 1 tablet (10 mg total) by mouth daily. 11/22/22  Yes Everrett Coombe, DO  spironolactone (ALDACTONE) 100 MG tablet Take 1 tablet (100 mg total) by mouth daily. Patient not taking: Reported on 01/19/2023 09/15/22 01/19/23  Lanae Boast, MD  tobramycin-dexamethasone [facility]) ophthalmic solution Place 1 drop into the  affected eye(s) every 6 (six) hours. Patient not taking: Reported on 01/19/2023 01/03/23       Physical Exam: Vitals:   01/19/23 1430 01/19/23 1500 01/19/23 1530 01/19/23 1629  BP: 114/81 122/77 118/70 123/68  Pulse:   75 72  Resp:   14 16  Temp:    97.8 F (36.6 C)  TempSrc:    Oral  SpO2:   96% 100%  Weight:      Height:        Constitutional: Appears comfortable, not in any acute distress. Vitals:   01/19/23 1430 01/19/23 1500 01/19/23 1530 01/19/23 1629  BP: 114/81 122/77 118/70 123/68  Pulse:   75 72  Resp:   14 16  Temp:    97.8 F (36.6 C)  TempSrc:    Oral  SpO2:   96% 100%  Weight:      Height:       Eyes: PERRL, lids and conjunctivae normal ENMT: Mucous membranes are moist.  Posterior pharynx without exudate. Neck: normal, supple, no masses, no thyromegaly Respiratory: CTA bilaterally, no wheezing, no crackles, normal respiratory effort, no accessory muscle use, RR 16 Cardiovascular: S1-S2 heard, regular rate and rhythm, no murmur. Abdomen: Abdomen is soft, nontender, nondistended, BS+ Musculoskeletal:  No joint deformity upper and lower extremities. Good ROM, no contractures. Normal muscle tone.  Skin: no rashes, lesions, ulcers. No induration Neurologic: CN 2-12 grossly intact. Sensation intact, DTR normal. Strength 5/5 in all 4.  Psychiatric: Normal judgment and insight. Alert and oriented x 3. Normal mood.    Labs on Admission: I have personally reviewed following labs and imaging studies  CBC: Recent Labs  Lab 01/19/23 0944  WBC 6.0  HGB 8.7*  HCT 26.9*  MCV 82.8  PLT 55*   Basic Metabolic Panel: Recent Labs  Lab 01/19/23 0944  NA 131*  K 2.7*  CL 101  CO2 21*  GLUCOSE 121*  BUN 13  CREATININE 0.79  CALCIUM 9.5  MG 1.1*   GFR: Estimated Creatinine Clearance: 95.2 mL/min (by C-G formula based on SCr of 0.79 mg/dL). Liver Function Tests: Recent Labs  Lab 01/19/23 0944  AST 58*  ALT 22  ALKPHOS 74  BILITOT 4.2*  PROT 6.4*   ALBUMIN 2.7*   No results for input(s): "LIPASE", "AMYLASE" in the last 168 hours. Recent Labs  Lab 01/19/23 1215  AMMONIA 32   Coagulation Profile: Recent Labs  Lab 01/19/23 0944  INR 2.0*   Cardiac Enzymes: No results for input(s): "CKTOTAL", "CKMB", "CKMBINDEX", "TROPONINI" in the last 168 hours. BNP (last 3 results) No results for input(s): "PROBNP" in the last 8760 hours.  HbA1C: No results for input(s): "HGBA1C" in the last 72 hours. CBG: No results for input(s): "GLUCAP" in the last 168 hours. Lipid Profile: No results for input(s): "CHOL", "HDL", "LDLCALC", "TRIG", "CHOLHDL", "LDLDIRECT" in the last 72 hours. Thyroid Function Tests: No results for input(s): "TSH", "T4TOTAL", "FREET4", "T3FREE", "THYROIDAB" in the last 72 hours. Anemia Panel: No results for input(s): "VITAMINB12", "FOLATE", "FERRITIN", "TIBC", "IRON", "RETICCTPCT" in the last 72 hours. Urine analysis:    Component Value Date/Time   COLORURINE YELLOW 09/14/2022 1042   APPEARANCEUR CLEAR 09/14/2022 1042   LABSPEC 1.008 09/14/2022 1042   PHURINE 9.0 (H) 09/14/2022 1042   GLUCOSEU NEGATIVE 09/14/2022 1042   HGBUR LARGE (A) 09/14/2022 1042   BILIRUBINUR NEGATIVE 09/14/2022 1042   BILIRUBINUR small (A) 04/17/2021 1646   KETONESUR NEGATIVE 09/14/2022 1042   PROTEINUR NEGATIVE 09/14/2022 1042   UROBILINOGEN 4.0 (A) 04/17/2021 1646   NITRITE NEGATIVE 09/14/2022 1042   LEUKOCYTESUR NEGATIVE 09/14/2022 1042    Radiological Exams on Admission: No results found.  EKG: Independently reviewed.  EKG sinus rhythm, prolonged QT interval  Assessment/Plan Principal Problem:   Hypokalemia Active Problems:   Epistaxis   Chronic alcoholic liver disease (HCC)   Benign essential hypertension   Thrombocytopenia (HCC)   Cirrhosis of liver (HCC)   Normocytic anemia   Hypomagnesemia   MDD (major depressive disorder), recurrent severe, without psychosis (HCC)   GAD (generalized anxiety disorder)   Prolonged  QT interval  Nausea, vomiting: Cyclical vomiting syndrome: Patient presented with persistent nausea and throwing up. Patient reports history of cyclical vomiting syndrome, has been taking Phenergan. Continue Phenergan as needed.  Continue IV hydration.  Epistaxis: Patient reports nosebleeds on Monday which subsequently stopped after continuous pressure. He denies any further nosebleeds.  H&H remains stable.  Hypokalemia/ Hyponatremia: Likely in the setting of persistent nausea and vomiting. Replaced.  Continue to monitor  Hypomagnesemia; Replaced.  Continue to monitor.  Thrombocytopenia: Platelet count remains at baseline. 55 K.  History of alcoholic liver disease: Stable.   Cirrhosis of liver: Stable.  Continue lactulose.  Normochromic normocytic anemia: H&H remains stable, no obvious visible bleeding noted  Essential hypertension: Hold blood pressure medications as blood pressures on soft side.  Prolonged QT: Avoid Zofran or other QT prolonging medications.  DVT prophylaxis: SCDs Code Status: Full code Family Communication:   Disposition Plan:   Status is: Observation The patient remains OBS appropriate and will d/c before 2 midnights.  Admitted for electrolyte abnormalities secondary to nausea and vomiting in the setting of epistaxis.  Consults called: None Admission status: Observation   Willeen Niece MD [facility]  If 7PM-7AM, please contact night-coverage   01/19/2023, 5:00 PM

## 2023-01-19 NOTE — ED Triage Notes (Signed)
Patient arrives ambulatory with rolling walker by POV c/o nausea onset of Sunday and had a nose bleed for about 6 hours on Monday.   Patient states last time this happened his platelets were low.

## 2023-01-20 ENCOUNTER — Other Ambulatory Visit (HOSPITAL_COMMUNITY): Payer: Self-pay

## 2023-01-20 DIAGNOSIS — E876 Hypokalemia: Secondary | ICD-10-CM | POA: Diagnosis not present

## 2023-01-20 DIAGNOSIS — R9431 Abnormal electrocardiogram [ECG] [EKG]: Secondary | ICD-10-CM

## 2023-01-20 LAB — CBC
HCT: 25.4 % — ABNORMAL LOW (ref 39.0–52.0)
Hemoglobin: 8 g/dL — ABNORMAL LOW (ref 13.0–17.0)
MCH: 26.9 pg (ref 26.0–34.0)
MCHC: 31.5 g/dL (ref 30.0–36.0)
MCV: 85.5 fL (ref 80.0–100.0)
Platelets: 53 10*3/uL — ABNORMAL LOW (ref 150–400)
RBC: 2.97 MIL/uL — ABNORMAL LOW (ref 4.22–5.81)
RDW: 23 % — ABNORMAL HIGH (ref 11.5–15.5)
WBC: 5.3 10*3/uL (ref 4.0–10.5)
nRBC: 0 % (ref 0.0–0.2)

## 2023-01-20 LAB — COMPREHENSIVE METABOLIC PANEL
ALT: 21 U/L (ref 0–44)
AST: 49 U/L — ABNORMAL HIGH (ref 15–41)
Albumin: 2.4 g/dL — ABNORMAL LOW (ref 3.5–5.0)
Alkaline Phosphatase: 62 U/L (ref 38–126)
Anion gap: 5 (ref 5–15)
BUN: 10 mg/dL (ref 6–20)
CO2: 22 mmol/L (ref 22–32)
Calcium: 8.1 mg/dL — ABNORMAL LOW (ref 8.9–10.3)
Chloride: 107 mmol/L (ref 98–111)
Creatinine, Ser: 0.94 mg/dL (ref 0.61–1.24)
GFR, Estimated: >60 mL/min (ref >60)
Glucose, Bld: 118 mg/dL — ABNORMAL HIGH (ref 70–99)
Potassium: 3.4 mmol/L — ABNORMAL LOW (ref 3.5–5.1)
Sodium: 134 mmol/L — ABNORMAL LOW (ref 135–145)
Total Bilirubin: 3.3 mg/dL — ABNORMAL HIGH (ref 0.3–1.2)
Total Protein: 5.8 g/dL — ABNORMAL LOW (ref 6.5–8.1)

## 2023-01-20 LAB — PHOSPHORUS: Phosphorus: 3.2 mg/dL (ref 2.5–4.6)

## 2023-01-20 LAB — HIV ANTIBODY (ROUTINE TESTING W REFLEX): HIV Screen 4th Generation wRfx: NONREACTIVE

## 2023-01-20 LAB — MAGNESIUM: Magnesium: 1.5 mg/dL — ABNORMAL LOW (ref 1.7–2.4)

## 2023-01-20 MED ORDER — FUROSEMIDE 40 MG PO TABS
40.0000 mg | ORAL_TABLET | Freq: Every day | ORAL | 1 refills | Status: DC
  Filled 2023-01-20: qty 30, 30d supply, fill #0

## 2023-01-20 MED ORDER — MAGNESIUM SULFATE 4 GM/100ML IV SOLN
4.0000 g | Freq: Once | INTRAVENOUS | Status: AC
  Administered 2023-01-20: 4 g via INTRAVENOUS
  Filled 2023-01-20: qty 100

## 2023-01-20 MED ORDER — POTASSIUM CHLORIDE CRYS ER 20 MEQ PO TBCR
20.0000 meq | EXTENDED_RELEASE_TABLET | Freq: Two times a day (BID) | ORAL | 1 refills | Status: DC
  Filled 2023-01-20 – 2023-03-10 (×2): qty 180, 90d supply, fill #0

## 2023-01-20 MED ORDER — POTASSIUM CHLORIDE CRYS ER 20 MEQ PO TBCR
40.0000 meq | EXTENDED_RELEASE_TABLET | Freq: Once | ORAL | Status: AC
  Administered 2023-01-20: 40 meq via ORAL
  Filled 2023-01-20: qty 2

## 2023-01-20 MED ORDER — MAGNESIUM OXIDE 400 MG PO TABS
400.0000 mg | ORAL_TABLET | Freq: Two times a day (BID) | ORAL | 2 refills | Status: DC
  Filled 2023-01-20: qty 60, 30d supply, fill #0
  Filled 2023-03-10: qty 120, 60d supply, fill #0

## 2023-01-20 MED ORDER — MAGNESIUM SULFATE 50 % IJ SOLN
4.0000 g | Freq: Once | INTRAVENOUS | Status: DC
  Filled 2023-01-20: qty 8

## 2023-01-20 MED ORDER — ONDANSETRON 8 MG PO TBDP
8.0000 mg | ORAL_TABLET | Freq: Three times a day (TID) | ORAL | 1 refills | Status: DC | PRN
  Filled 2023-01-20: qty 30, 10d supply, fill #0

## 2023-01-20 NOTE — Discharge Summary (Addendum)
Physician Discharge Summary   Patient: Juan [name] MRN: [id number] DOB: [birth year]  Admit date:     01/19/2023  Discharge date: 01/20/23  Discharge Physician: Thad Ranger, MD    PCP: Everrett Coombe, DO   Recommendations at discharge:   Placed on potassium 40 mEq daily due to persistent hypokalemia Placed on magnesium oxide 400 mg twice daily Please follow BMET, CBC  Discharge Diagnoses:    Hypokalemia   Epistaxis-resolved   Chronic alcoholic liver disease (HCC)   Benign essential hypertension   Thrombocytopenia (HCC)   Cirrhosis of liver (HCC)   Normocytic anemia   Hypomagnesemia   MDD (major depressive disorder), recurrent severe, without psychosis (HCC)   GAD (generalized anxiety disorder)   Prolonged QT interval  \  Hospital Course:  Patient is a 57 year old male with history of alcohol use, alcoholic liver cirrhosis, thrombocytopenia, recurrent nosebleeds, depression, anxiety disorder, chronic alcoholic myopathy, uses walker, cane for ambulation at baseline presented to ED after being referred by his PCP for evaluation.  Patient ported that he had developed a spontaneous nosebleed on Monday which lasted for 6 hours and subsequently stopped. Patient went to his PCP who recommended ER evaluation  Assessment and Plan:    Epistaxis: Patient reports nosebleeds on Monday which subsequently stopped after continuous pressure. -No further nosebleeds, platelets 53 K, will hold for platelet transfusion given patient does not have any bleeding at this time -He feels he is ready to go home   Nausea, vomiting, Cyclical vomiting syndrome: Patient presented with persistent nausea and throwing up, has underlying history of cyclical vomiting syndrome, has been taking the Phenergan. -Continue Zofran, Phenergan as needed, currently no nausea or vomiting, tolerating diet     Hyponatremia: Likely in the setting of persistent nausea and vomiting. -Improving, sodium 129 on  admission, 134 at discharge, tolerating diet   Hypokalemia, hypomagnesemia; Replaced and given prescription for daily supplement   Thrombocytopenia: Platelet count remains at baseline.   History of alcoholic liver disease, cirrhosis: -Continue Lasix, it appears that Aldactone was recently placed on hold by PCP Continue lactulose   Normochromic normocytic anemia: H&H stable   Essential hypertension: Hold blood pressure medications as blood pressures on soft side.   Prolonged QT: Avoid QT prolonging meds      Pain control - [place] Controlled Substance Reporting System database was reviewed. and patient was instructed, not to drive, operate heavy machinery, perform activities at heights, swimming or participation in water activities or provide baby-sitting services while on Pain, Sleep and Anxiety Medications; until their outpatient Physician has advised to do so again. Also recommended to not to take more than prescribed Pain, Sleep and Anxiety Medications.  Consultants: None Procedures performed:  Disposition: Home Diet recommendation:  Discharge Diet Orders (From admission, onward)     Start     Ordered   01/20/23 0000  Diet - low sodium heart healthy        01/20/23 0938            DISCHARGE MEDICATION: Allergies as of 01/20/2023       Reactions   Apple Juice Swelling, Other (See Comments)   Tongue swelling   Cucumber Extract Itching, Nausea And Vomiting   No extracts; just cucumber   Depakote Er [divalproex Sodium Er] Swelling, Other (See Comments)   Tongue swelling   Depakote [valproic Acid] Anaphylaxis   Peanut Butter Flavor Anaphylaxis, Swelling   Peanut Oil Swelling   Peanut-containing Drug Products Swelling   Shellfish Allergy Itching, Swelling  Shrimp Extract Itching, Swelling   Strawberry Extract Nausea And Vomiting, Swelling   Apple Swelling   Cantaloupe Extract Allergy Skin Test Rash   Codeine Itching, Rash, Other (See Comments)    Patient reports he can take "CODONES" without problems   Firvanq [vancomycin] Rash   Lactose Intolerance (gi) Diarrhea, Other (See Comments)   Indigestion, Stomach pain, Flatulence        Medication List     STOP taking these medications    MagDelay 64 MG Tbec Generic drug: Magnesium Chloride   spironolactone 100 MG tablet Commonly known as: ALDACTONE   tobramycin-dexamethasone ophthalmic solution Commonly known as: TOBRADEX       TAKE these medications    busPIRone 15 MG tablet Commonly known as: BUSPAR Take 1 tablet (15 mg total) by mouth 2 (two) times daily.   furosemide 40 MG tablet Commonly known as: Lasix Take 1 tablet (40 mg) by mouth daily.   hydrocortisone 25 MG suppository Commonly known as: ANUSOL-HC Place 1 suppository (25 mg total) rectally 2 (two) times daily. What changed:  when to take this reasons to take this   hydrOXYzine 25 MG tablet Commonly known as: ATARAX Take 0.5-1 tablets (12.5-25 mg total) by mouth every 8 (eight) hours as needed for itching.   lactulose (encephalopathy) 10 GM/15ML Soln Commonly known as: CHRONULAC Take 10 g by mouth daily as needed (constipation).   magnesium oxide 400 MG tablet Commonly known as: MAG-OX Take 1 tablet (400 mg total) by mouth 2 (two) times daily.   ondansetron 8 MG disintegrating tablet Commonly known as: ZOFRAN-ODT Dissolve 1 tablet (8 mg total) by mouth every 8 (eight) hours as needed for nausea. What changed:  when to take this reasons to take this   pantoprazole 40 MG tablet Commonly known as: PROTONIX Take 1 tablet (40 mg total) by mouth daily.   potassium chloride SA 20 MEQ tablet Commonly known as: KLOR-CON M Take 1 tablet (20 mEq total) by mouth 2 (two) times daily.   prochlorperazine 10 MG tablet Commonly known as: COMPAZINE Take 1 tablet (10 mg total) by mouth every 6 (six) hours as needed for nausea or vomiting. What changed: when to take this   saccharomyces boulardii 250  MG capsule Commonly known as: FLORASTOR Take 250 mg by mouth 2 (two) times daily.   triamcinolone cream 0.1 % Commonly known as: KENALOG Apply 1 Application topically 2 (two) times daily. What changed:  when to take this reasons to take this   Trintellix 10 MG Tabs tablet Generic drug: vortioxetine HBr Take 1 tablet (10 mg total) by mouth daily.        Follow-up Information     Everrett Coombe, DO. Schedule an appointment as soon as possible for a visit in 10 day(s).   Specialty: Family Medicine Why: for hospital follow-up, obtain labs, metabolic panel for potassium and magnesium, CBC for blood counts and platelets Contact information: [address]  [address] [place] [address] [phone]                Discharge Exam: Ceasar Mons Weights   01/19/23 0928  Weight: 75.8 kg   S: No further epistaxis, tolerating diet, wants to go home.  BP 103/65 (BP Location: Left Arm)   Pulse 79   Temp 98.5 F (36.9 C) (Oral)   Resp 16   Ht 5\' 3"  (1.6 m)   Wt 75.8 kg   SpO2 96%   BMI 29.58 kg/m   Physical Exam  General: Alert and oriented x 3, NAD Cardiovascular: S1 S2 clear, RRR.  Respiratory: CTAB, no wheezing, rales or rhonchi Gastrointestinal: Soft, nontender, nondistended, NBS Ext: no pedal edema bilaterally Neuro: no new deficits Psych: Normal affect and demeanor, alert and oriented x3    Condition at discharge: fair  The results of significant diagnostics from this hospitalization (including imaging, microbiology, ancillary and laboratory) are listed below for reference.   Imaging Studies: MR KNEE RIGHT WO CONTRAST  Addendum Date: 01/16/2023   ADDENDUM REPORT: 01/16/2023 08:12 ADDENDUM: A sagittal T2 fat-sat was sent to PACS after the initial report was signed. The ACL graft appears intact. Electronically Signed   By: Caprice Renshaw M.D.   On: 01/16/2023 08:12   Result Date: 01/16/2023 CLINICAL DATA:  Meniscal injury, knee Persistent right medial  knee pain with swelling, McMurray's sign, evaluate for meniscal tearing EXAM: MRI OF THE RIGHT KNEE WITHOUT CONTRAST TECHNIQUE: Multiplanar, multisequence MR imaging of the knee was performed. No intravenous contrast was administered. COMPARISON:  Left knee radiograph 01/04/2023 FINDINGS: MENISCI Medial: There is degenerative tearing of the posterior horn and body of the medial meniscus with substance loss. Lateral: No evidence of lateral meniscus tear. LIGAMENTS Cruciates: Prior ACL reconstruction. The graft is incompletely evaluated due to lack of a sagittal T2 fat sat or STIR, however is favored to be intact based on axial and coronal images. Collaterals: Medial collateral ligament is intact. Lateral collateral ligament complex is intact. CARTILAGE Patellofemoral: Focal high-grade chondrosis of the inferior central trochlea. Medial: Intermediate to high grade partial-thickness cartilage loss the weight-bearing surface. Lateral:  Low-grade partial-thickness cartilage loss. JOINT: Small joint effusion. POPLITEAL FOSSA: No significant Baker's cyst. EXTENSOR MECHANISM: Postsurgical changes of the patellar tendon. Intact quadriceps tendon. BONES: No evidence of acute fracture. No aggressive osseous lesion. Islands of red marrow noted in the distal femur and proximal tibia. Other: Mild soft tissue swelling along the knee. No focal fluid collection. IMPRESSION: Prior ACL reconstruction. The graft is incompletely evaluated but favored to be intact, correlate with exam. Tricompartment osteoarthritis, worst in the medial compartment with intermediate to high-grade cartilage loss along the weight-bearing surfaces. Degenerative tearing of the posterior horn and body of the medial meniscus with substance loss. Small joint effusion. Generalized mild soft tissue swelling along the knee. Electronically Signed: By: Caprice Renshaw M.D. On: 01/14/2023 15:56   DG Knee Complete 4 Views Right  Result Date: 01/08/2023 CLINICAL DATA:   Patient with history of a ACL reconstruction and meniscectomy with increasing pain in the RIGHT medial knee EXAM: RIGHT KNEE - COMPLETE 4+ VIEW; LEFT KNEE - 1-2 VIEW COMPARISON:  None Available. FINDINGS: RIGHT: There is lucency abutting the femoral screw status post ACL repair. No prior radiographs are available for comparison. There is mild medial subluxation of the femur in relation to the tibia. Mild to moderate joint space narrowing of the medial compartment with osteophyte formation. Mild joint space narrowing and osteophyte formation of the lateral compartment. Minimal degenerative changes of the patellofemoral compartment. No acute fracture or dislocation. Moderate joint effusion. Punctate metallic densities overlying the medial tibial metadiaphysis, likely postsurgical. LEFT knee: No acute fracture or dislocation on AP view. Medial and lateral joint space compartments are grossly preserved. IMPRESSION: 1. Lucency abutting the femoral screw of the RIGHT knee status post ACL repair. This may reflect loosening. Recommend correlation with any prior radiographs to assess for interval change. 2. Mild-to-moderate degenerative changes of the RIGHT knee with moderate joint effusion. Electronically Signed   By: Judeth Cornfield  Peacock M.D.   On: 01/08/2023 13:52   DG Knee 1-2 Views Left  Result Date: 01/08/2023 CLINICAL DATA:  Patient with history of a ACL reconstruction and meniscectomy with increasing pain in the RIGHT medial knee EXAM: RIGHT KNEE - COMPLETE 4+ VIEW; LEFT KNEE - 1-2 VIEW COMPARISON:  None Available. FINDINGS: RIGHT: There is lucency abutting the femoral screw status post ACL repair. No prior radiographs are available for comparison. There is mild medial subluxation of the femur in relation to the tibia. Mild to moderate joint space narrowing of the medial compartment with osteophyte formation. Mild joint space narrowing and osteophyte formation of the lateral compartment. Minimal degenerative  changes of the patellofemoral compartment. No acute fracture or dislocation. Moderate joint effusion. Punctate metallic densities overlying the medial tibial metadiaphysis, likely postsurgical. LEFT knee: No acute fracture or dislocation on AP view. Medial and lateral joint space compartments are grossly preserved. IMPRESSION: 1. Lucency abutting the femoral screw of the RIGHT knee status post ACL repair. This may reflect loosening. Recommend correlation with any prior radiographs to assess for interval change. 2. Mild-to-moderate degenerative changes of the RIGHT knee with moderate joint effusion. Electronically Signed   By: Meda Klinefelter M.D.   On: 01/08/2023 13:52   [place] LIMITED JOINT SPACE STRUCTURES LOW RIGHT  Result Date: 01/04/2023 Procedure: Real-time Ultrasound Guided injection of the right knee Device: Samsung [id number] Verbal informed consent obtained. Time-out conducted. Noted no overlying erythema, induration, or other signs of local infection. Skin prepped in a sterile fashion. Local anesthesia: Topical Ethyl chloride. With sterile technique and under real time ultrasound guidance: Effusion noted, 1 cc Kenalog 40, 2 cc lidocaine, 2 cc bupivacaine injected easily Completed without difficulty Advised to call if fevers/chills, erythema, induration, drainage, or persistent bleeding. Images permanently stored and available for review in PACS. Impression: Technically successful ultrasound guided injection.    Microbiology: Results for orders placed or performed during the hospital encounter of 09/13/22  Resp panel by RT-PCR (RSV, Flu A&B, Covid) Anterior Nasal Swab     Status: None   Collection Time: 09/13/22  3:08 PM   Specimen: Anterior Nasal Swab  Result Value Ref Range Status   SARS Coronavirus 2 by RT PCR NEGATIVE NEGATIVE Final    Comment: (NOTE) SARS-CoV-2 target nucleic acids are NOT DETECTED.  The SARS-CoV-2 RNA is generally detectable in upper respiratory specimens during the acute  phase of infection. The lowest concentration of SARS-CoV-2 viral copies this assay can detect is 138 copies/mL. A negative result does not preclude SARS-Cov-2 infection and should not be used as the sole basis for treatment or other patient management decisions. A negative result may occur with  improper specimen collection/handling, submission of specimen other than nasopharyngeal swab, presence of viral mutation(s) within the areas targeted by this assay, and inadequate number of viral copies(<138 copies/mL). A negative result must be combined with clinical observations, patient history, and epidemiological information. The expected result is Negative.  Fact Sheet for Patients:  BloggerCourse.com  Fact Sheet for Healthcare Providers:  SeriousBroker.it  This test is no t yet approved or cleared by the [place] FDA and  has been authorized for detection and/or diagnosis of SARS-CoV-2 by FDA under an Emergency Use Authorization (EUA). This EUA will remain  in effect (meaning this test can be used) for the duration of the COVID-19 declaration under Section 564(b)(1) of the Act, 21 U.S.C.section 360bbb-3(b)(1), unless the authorization is terminated  or revoked sooner.       Influenza A by  PCR NEGATIVE NEGATIVE Final   Influenza B by PCR NEGATIVE NEGATIVE Final    Comment: (NOTE) The Xpert Xpress SARS-CoV-2/FLU/RSV plus assay is intended as an aid in the diagnosis of influenza from Nasopharyngeal swab specimens and should not be used as a sole basis for treatment. Nasal washings and aspirates are unacceptable for Xpert Xpress SARS-CoV-2/FLU/RSV testing.  Fact Sheet for Patients: BloggerCourse.com  Fact Sheet for Healthcare Providers: SeriousBroker.it  This test is not yet approved or cleared by the [place] FDA and has been authorized for detection and/or diagnosis of  SARS-CoV-2 by FDA under an Emergency Use Authorization (EUA). This EUA will remain in effect (meaning this test can be used) for the duration of the COVID-19 declaration under Section 564(b)(1) of the Act, 21 U.S.C. section 360bbb-3(b)(1), unless the authorization is terminated or revoked.     Resp Syncytial Virus by PCR NEGATIVE NEGATIVE Final    Comment: (NOTE) Fact Sheet for Patients: BloggerCourse.com  Fact Sheet for Healthcare Providers: SeriousBroker.it  This test is not yet approved or cleared by the [place] FDA and has been authorized for detection and/or diagnosis of SARS-CoV-2 by FDA under an Emergency Use Authorization (EUA). This EUA will remain in effect (meaning this test can be used) for the duration of the COVID-19 declaration under Section 564(b)(1) of the Act, 21 U.S.C. section 360bbb-3(b)(1), unless the authorization is terminated or revoked.  Performed at [facility], [address] [place], [place] [address]     Labs: CBC: Recent Labs  Lab 01/19/23 0944 01/20/23 0431  WBC 6.0 5.3  HGB 8.7* 8.0*  HCT 26.9* 25.4*  MCV 82.8 85.5  PLT 55* 53*   Basic Metabolic Panel: Recent Labs  Lab 01/19/23 0944 01/20/23 0431  NA 131* 134*  K 2.7* 3.4*  CL 101 107  CO2 21* 22  GLUCOSE 121* 118*  BUN 13 10  CREATININE 0.79 0.94  CALCIUM 9.5 8.1*  MG 1.1* 1.5*  PHOS  --  3.2   Liver Function Tests: Recent Labs  Lab 01/19/23 0944 01/20/23 0431  AST 58* 49*  ALT 22 21  ALKPHOS 74 62  BILITOT 4.2* 3.3*  PROT 6.4* 5.8*  ALBUMIN 2.7* 2.4*   CBG: No results for input(s): "GLUCAP" in the last 168 hours.  Discharge time spent: greater than 30 minutes.  Signed: Thad Ranger, MD [facility] 01/20/2023

## 2023-01-21 ENCOUNTER — Telehealth: Payer: Self-pay

## 2023-01-21 NOTE — Transitions of Care (Post Inpatient/ED Visit) (Unsigned)
   01/21/2023  Name: Juan [name] MRN: [id number] DOB: July 22, 1966  Today's TOC FU Call Status: Today's TOC FU Call Status:: Unsuccessul Call (1st Attempt) Unsuccessful Call (1st Attempt) Date: 01/21/23  Attempted to reach the patient regarding the most recent Inpatient/ED visit.  Follow Up Plan: Additional outreach attempts will be made to reach the patient to complete the Transitions of Care (Post Inpatient/ED visit) call.   Signature Karena Addison, LPN [facility] Nurse Health Advisor Direct Dial [phone]

## 2023-01-24 ENCOUNTER — Inpatient Hospital Stay: Payer: Commercial Managed Care - PPO | Admitting: Family Medicine

## 2023-01-25 NOTE — Transitions of Care (Post Inpatient/ED Visit) (Signed)
01/25/2023  Name: Juan [name] MRN: [id number] DOB: Sep 21, 1965  Today's TOC FU Call Status: Today's TOC FU Call Status:: Successful TOC FU Call Competed Unsuccessful Call (1st Attempt) Date: 01/21/23 [facility] FU Call Complete Date: 01/25/23  Transition Care Management Follow-up Telephone Call Date of Discharge: 01/20/23 Discharge Facility: [facility]) Type of Discharge: Inpatient Admission Primary Inpatient Discharge Diagnosis:: abnormal EKG How have you been since you were released from the hospital?: Better Any questions or concerns?: No  Items Reviewed: Did you receive and understand the discharge instructions provided?: Yes Medications obtained,verified, and reconciled?: Yes (Medications Reviewed) Any new allergies since your discharge?: No Dietary orders reviewed?: Yes Do you have support at home?: Yes People in Home: spouse  Medications Reviewed Today: Medications Reviewed Today     Reviewed by Karena Addison, LPN (Licensed Practical Nurse) on 01/25/23 at 1455  Med List Status: <None>   Medication Order Taking? Sig Documenting Provider Last Dose Status Informant  busPIRone (BUSPAR) 15 MG tablet [id number] Yes Take 1 tablet (15 mg total) by mouth 2 (two) times daily. Everrett Coombe, DO Taking Active Self, Pharmacy Records  furosemide (LASIX) 40 MG tablet [id number] Yes Take 1 tablet (40 mg) by mouth daily. Rai, Delene Ruffini, MD Taking Active   hydrocortisone (ANUSOL-HC) 25 MG suppository [id number] Yes Place 1 suppository (25 mg total) rectally 2 (two) times daily.  Patient taking differently: Place 25 mg rectally 2 (two) times daily as needed for hemorrhoids or anal itching.   Rhetta Mura, MD Taking Active Self, Pharmacy Records  hydrOXYzine (ATARAX) 25 MG tablet [id number] Yes Take 0.5-1 tablets (12.5-25 mg total) by mouth every 8 (eight) hours as needed for itching. Everrett Coombe, DO Taking Active Self, Pharmacy Records  lactulose, encephalopathy, (CHRONULAC) 10  GM/15ML SOLN [id number] Yes Take 10 g by mouth daily as needed (constipation). [provider] Taking Active Self, Pharmacy Records  magnesium oxide (MAG-OX) 400 MG tablet [id number] Yes Take 1 tablet (400 mg total) by mouth 2 (two) times daily. Rai, Delene Ruffini, MD Taking Active   ondansetron (ZOFRAN-ODT) 8 MG disintegrating tablet [id number] Yes Dissolve 1 tablet (8 mg total) by mouth every 8 (eight) hours as needed for nausea. Rai, Delene Ruffini, MD Taking Active   pantoprazole (PROTONIX) 40 MG tablet [id number] Yes Take 1 tablet (40 mg total) by mouth daily. Imogene Burn, MD Taking Active Self, Pharmacy Records  potassium chloride SA (KLOR-CON M) 20 MEQ tablet [id number] Yes Take 1 tablet (20 mEq total) by mouth 2 (two) times daily. Rai, Delene Ruffini, MD Taking Active   prochlorperazine (COMPAZINE) 10 MG tablet [id number] Yes Take 1 tablet (10 mg total) by mouth every 6 (six) hours as needed for nausea or vomiting.  Patient taking differently: Take 10 mg by mouth as needed for nausea or vomiting.   Monica Becton, MD Taking Active Self, Pharmacy Records  saccharomyces boulardii [facility]) 250 MG capsule [id number] Yes Take 250 mg by mouth 2 (two) times daily.  [provider] Taking Active Self, Pharmacy Records  triamcinolone cream (KENALOG) 0.1 % [id number] Yes Apply 1 Application topically 2 (two) times daily.  Patient taking differently: Apply 1 Application topically as needed (flares).   Everrett Coombe, DO Taking Active Self, Pharmacy Records  vortioxetine HBr (TRINTELLIX) 10 MG TABS tablet [id number] Yes Take 1 tablet (10 mg total) by mouth daily. Everrett Coombe, DO Taking Active Self, Pharmacy Records            Home Care and Equipment/Supplies: Were Home Health Services  Ordered?: NA Any new equipment or medical supplies ordered?: NA  Functional Questionnaire: Do you need assistance with bathing/showering or dressing?: No Do you need assistance with meal  preparation?: No Do you need assistance with eating?: No Do you have difficulty maintaining continence: No Do you need assistance with getting out of bed/getting out of a chair/moving?: No Do you have difficulty managing or taking your medications?: No  Follow up appointments reviewed: PCP Follow-up appointment confirmed?: Yes Date of PCP follow-up appointment?: 01/31/23 Follow-up Provider: Dr Ashley [facility] Follow-up appointment confirmed?: NA Do you need transportation to your follow-up appointment?: No Do you understand care options if your condition(s) worsen?: Yes-patient verbalized understanding    SIGNATURE Karena Addison, LPN [facility] Nurse Health Advisor Direct Dial [phone]

## 2023-01-31 DIAGNOSIS — M6281 Muscle weakness (generalized): Secondary | ICD-10-CM | POA: Diagnosis not present

## 2023-02-02 ENCOUNTER — Inpatient Hospital Stay: Payer: Commercial Managed Care - PPO | Admitting: Family Medicine

## 2023-02-08 ENCOUNTER — Telehealth: Payer: Self-pay | Admitting: Sports Medicine

## 2023-02-08 ENCOUNTER — Ambulatory Visit (INDEPENDENT_AMBULATORY_CARE_PROVIDER_SITE_OTHER): Payer: Commercial Managed Care - PPO | Admitting: Sports Medicine

## 2023-02-08 ENCOUNTER — Other Ambulatory Visit (HOSPITAL_BASED_OUTPATIENT_CLINIC_OR_DEPARTMENT_OTHER): Payer: Self-pay

## 2023-02-08 DIAGNOSIS — M1731 Unilateral post-traumatic osteoarthritis, right knee: Secondary | ICD-10-CM

## 2023-02-08 MED ORDER — DICLOFENAC SODIUM 1 % EX GEL
4.0000 g | Freq: Four times a day (QID) | CUTANEOUS | 11 refills | Status: DC
Start: 2023-02-08 — End: 2024-03-03
  Filled 2023-02-08: qty 100, 7d supply, fill #0

## 2023-02-08 NOTE — Telephone Encounter (Signed)
PA information submitted via [email or web address] for [employer] Paperwork has been printed and given to Dr. T for signatures. Once obtained, information will be faxed to [employer] at [phone]  

## 2023-02-08 NOTE — Progress Notes (Signed)
    Procedures performed today:    None.  Independent interpretation of notes and tests performed by another provider:   None.  Brief History, Exam, Impression, and Recommendations:    Post-traumatic osteoarthritis of right knee Juan [name] returns, he is a pleasant 57 year old male, he has had multiple right knee surgeries including ACL reconstruction, meniscectomy, we saw him for increasing pain right medial knee, swelling, positive McMurray's sign, I injected his knee, he really did not get much relief, he did get MRI that showed mostly arthritic changes, degenerative meniscal tearing and an intact ACL graft. Due to failure of conservative treatment with mostly arthritic changes in the he is not a candidate for arthroscopic debridement, we will proceed next with viscosupplementation, if this fails we can consider genicular artery embolization. Knee arthroplasty will be absolute last resort. Adding topical Voltaren for pain relief in the meantime.    ____________________________________________ Juan [name]. Benjamin Stain, M.D., ABFM., CAQSM., AME. Primary Care and Sports Medicine [facility]  Adjunct Professor of Family Medicine  [employer]  Restaurant manager, fast food

## 2023-02-08 NOTE — Telephone Encounter (Signed)
Please work on [employer] approval right knee, failed physical therapy for over 6 weeks, has x-ray and MRI confirmed arthritis, failed analgesics, NSAIDs, injections.

## 2023-02-08 NOTE — Assessment & Plan Note (Signed)
Juan [name] returns, he is a pleasant 57 year old male, he has had multiple right knee surgeries including ACL reconstruction, meniscectomy, we saw him for increasing pain right medial knee, swelling, positive McMurray's sign, I injected his knee, he really did not get much relief, he did get MRI that showed mostly arthritic changes, degenerative meniscal tearing and an intact ACL graft. Due to failure of conservative treatment with mostly arthritic changes in the he is not a candidate for arthroscopic debridement, we will proceed next with viscosupplementation, if this fails we can consider genicular artery embolization. Knee arthroplasty will be absolute last resort. Adding topical Voltaren for pain relief in the meantime.

## 2023-02-09 ENCOUNTER — Encounter: Payer: Self-pay | Admitting: Family Medicine

## 2023-02-09 ENCOUNTER — Inpatient Hospital Stay: Payer: Commercial Managed Care - PPO | Admitting: Family Medicine

## 2023-02-11 ENCOUNTER — Other Ambulatory Visit: Payer: Self-pay | Admitting: Family Medicine

## 2023-02-11 ENCOUNTER — Other Ambulatory Visit (HOSPITAL_BASED_OUTPATIENT_CLINIC_OR_DEPARTMENT_OTHER): Payer: Self-pay

## 2023-02-11 ENCOUNTER — Telehealth: Payer: Self-pay | Admitting: Family Medicine

## 2023-02-11 MED ORDER — NIRMATRELVIR/RITONAVIR (PAXLOVID)TABLET
3.0000 | ORAL_TABLET | Freq: Two times a day (BID) | ORAL | 0 refills | Status: AC
  Filled 2023-02-11: qty 30, 5d supply, fill #0

## 2023-02-11 NOTE — Telephone Encounter (Signed)
Patient's wife called patient tested positive for COVID at home test  He is requesting medication [employer] [phone]

## 2023-02-11 NOTE — Telephone Encounter (Signed)
Patient's wife advised

## 2023-02-15 ENCOUNTER — Other Ambulatory Visit: Payer: Commercial Managed Care - PPO

## 2023-02-15 ENCOUNTER — Ambulatory Visit: Payer: Commercial Managed Care - PPO | Admitting: Sports Medicine

## 2023-02-16 NOTE — Telephone Encounter (Signed)
Benefits Investigation Details received from [employer] [facility]: [employer] PA required: Yes PA form and medical records faxed to [phone] at 1:50  Fax confirmation received 1:50 May fill through: [facility] Copay/Coinsurance: 0% Product Copay: $50 Administration Coinsurance: 0% Administration Copay: 0% Deductible: $500 (met: $500) Out of Pocket Max: $7900 (met: $2757.34)

## 2023-02-17 ENCOUNTER — Other Ambulatory Visit: Payer: Self-pay

## 2023-02-17 ENCOUNTER — Telehealth: Payer: Self-pay

## 2023-02-17 ENCOUNTER — Telehealth: Payer: Self-pay | Admitting: Family Medicine

## 2023-02-17 ENCOUNTER — Encounter (HOSPITAL_BASED_OUTPATIENT_CLINIC_OR_DEPARTMENT_OTHER): Payer: Self-pay

## 2023-02-17 ENCOUNTER — Emergency Department (HOSPITAL_BASED_OUTPATIENT_CLINIC_OR_DEPARTMENT_OTHER)
Admission: EM | Admit: 2023-02-17 | Discharge: 2023-02-17 | Disposition: A | Discharge status: Home / Self Care | Payer: Commercial Managed Care - PPO | Attending: Emergency Medicine | Admitting: Emergency Medicine

## 2023-02-17 DIAGNOSIS — U071 COVID-19: Secondary | ICD-10-CM | POA: Insufficient documentation

## 2023-02-17 DIAGNOSIS — I10 Essential (primary) hypertension: Secondary | ICD-10-CM | POA: Diagnosis not present

## 2023-02-17 DIAGNOSIS — Z91010 Allergy to peanuts: Secondary | ICD-10-CM | POA: Insufficient documentation

## 2023-02-17 DIAGNOSIS — R791 Abnormal coagulation profile: Secondary | ICD-10-CM | POA: Insufficient documentation

## 2023-02-17 DIAGNOSIS — K625 Hemorrhage of anus and rectum: Secondary | ICD-10-CM | POA: Diagnosis present

## 2023-02-17 DIAGNOSIS — K922 Gastrointestinal hemorrhage, unspecified: Secondary | ICD-10-CM | POA: Insufficient documentation

## 2023-02-17 DIAGNOSIS — R42 Dizziness and giddiness: Secondary | ICD-10-CM | POA: Insufficient documentation

## 2023-02-17 LAB — PROTIME-INR
INR: 1.9 — ABNORMAL HIGH (ref 0.8–1.2)
Prothrombin Time: 22 seconds — ABNORMAL HIGH (ref 11.4–15.2)

## 2023-02-17 LAB — COMPREHENSIVE METABOLIC PANEL
ALT: 24 U/L (ref 0–44)
AST: 58 U/L — ABNORMAL HIGH (ref 15–41)
Albumin: 2.7 g/dL — ABNORMAL LOW (ref 3.5–5.0)
Alkaline Phosphatase: 87 U/L (ref 38–126)
Anion gap: 8 (ref 5–15)
BUN: 10 mg/dL (ref 6–20)
CO2: 21 mmol/L — ABNORMAL LOW (ref 22–32)
Calcium: 8.1 mg/dL — ABNORMAL LOW (ref 8.9–10.3)
Chloride: 102 mmol/L (ref 98–111)
Creatinine, Ser: 0.93 mg/dL (ref 0.61–1.24)
GFR, Estimated: >60 mL/min (ref >60)
Glucose, Bld: 131 mg/dL — ABNORMAL HIGH (ref 70–99)
Potassium: 3.2 mmol/L — ABNORMAL LOW (ref 3.5–5.1)
Sodium: 131 mmol/L — ABNORMAL LOW (ref 135–145)
Total Bilirubin: 3.6 mg/dL — ABNORMAL HIGH (ref 0.3–1.2)
Total Protein: 6.6 g/dL (ref 6.5–8.1)

## 2023-02-17 LAB — CBC WITH DIFFERENTIAL/PLATELET
Abs Immature Granulocytes: 0.03 10*3/uL (ref 0.00–0.07)
Basophils Absolute: 0.1 10*3/uL (ref 0.0–0.1)
Basophils Relative: 1 %
Eosinophils Absolute: 0.2 10*3/uL (ref 0.0–0.5)
Eosinophils Relative: 4 %
HCT: 27.6 % — ABNORMAL LOW (ref 39.0–52.0)
Hemoglobin: 8.9 g/dL — ABNORMAL LOW (ref 13.0–17.0)
Immature Granulocytes: 1 %
Lymphocytes Relative: 27 %
Lymphs Abs: 1.2 10*3/uL (ref 0.7–4.0)
MCH: 27.1 pg (ref 26.0–34.0)
MCHC: 32.2 g/dL (ref 30.0–36.0)
MCV: 83.9 fL (ref 80.0–100.0)
Monocytes Absolute: 0.4 10*3/uL (ref 0.1–1.0)
Monocytes Relative: 8 %
Neutro Abs: 2.7 10*3/uL (ref 1.7–7.7)
Neutrophils Relative %: 59 %
Platelets: 74 10*3/uL — ABNORMAL LOW (ref 150–400)
RBC: 3.29 MIL/uL — ABNORMAL LOW (ref 4.22–5.81)
RDW: 22.4 % — ABNORMAL HIGH (ref 11.5–15.5)
WBC: 4.6 10*3/uL (ref 4.0–10.5)
nRBC: 0 % (ref 0.0–0.2)

## 2023-02-17 MED ORDER — SODIUM CHLORIDE 0.9 % IV BOLUS
1000.0000 mL | Freq: Once | INTRAVENOUS | Status: AC
  Administered 2023-02-17: 1000 mL via INTRAVENOUS

## 2023-02-17 MED ORDER — POTASSIUM CHLORIDE CRYS ER 20 MEQ PO TBCR
20.0000 meq | EXTENDED_RELEASE_TABLET | Freq: Once | ORAL | Status: AC
  Administered 2023-02-17: 20 meq via ORAL
  Filled 2023-02-17: qty 1

## 2023-02-17 MED ORDER — ONDANSETRON HCL 4 MG/2ML IJ SOLN
4.0000 mg | Freq: Once | INTRAMUSCULAR | Status: AC
  Administered 2023-02-17: 4 mg via INTRAVENOUS
  Filled 2023-02-17: qty 2

## 2023-02-17 MED ORDER — ACETAMINOPHEN 325 MG PO TABS
650.0000 mg | ORAL_TABLET | Freq: Once | ORAL | Status: AC
  Administered 2023-02-17: 650 mg via ORAL
  Filled 2023-02-17: qty 2

## 2023-02-17 NOTE — ED Notes (Signed)
IV removed. Skin intact.  

## 2023-02-17 NOTE — Discharge Instructions (Signed)
I spoke with [facility] representative. They are going to try to get you in sometime in the next 7-10 days for follow-up. Please call the office if you do not receive a call from them in the next 48 hours.

## 2023-02-17 NOTE — ED Provider Notes (Signed)
[facility] Provider Note   CSN: [id number] Arrival date & time: 02/17/23  1431     History  Chief Complaint  Patient presents with   Rectal Bleeding    Juan [name] is a 57 y.o. male.  Patient is a 57 year old male with past medical history of alcoholic liver cirrhosis with previous history of internal hemorrhoid banding presenting for complaints of hematochezia.  Patient admits to bright red blood in stool for the last 2 days.  Denies any abdominal pain, abdominal distention, or abdominal bloating.  Admits to minimal nausea without vomiting.  He also endorses weakness, fatigue, feelings of chills and shakiness that he attributes to his COVID-19 virus diagnosis.  Symptom onset and diagnosis was approximately 7 days ago.  Denies any history of syncope.  The history is provided by the patient. No language interpreter was used.  Rectal Bleeding Associated symptoms: dizziness and light-headedness   Associated symptoms: no abdominal pain, no fever and no vomiting        Home Medications Prior to Admission medications   Medication Sig Start Date End Date Taking? Authorizing Provider  busPIRone (BUSPAR) 15 MG tablet Take 1 tablet (15 mg total) by mouth 2 (two) times daily. 11/18/22   Everrett Coombe, DO  diclofenac Sodium (VOLTAREN) 1 % GEL Apply 4 grams topically 4 (four) times daily to affected joint. 02/08/23   Monica Becton, MD  furosemide (LASIX) 40 MG tablet Take 1 tablet (40 mg) by mouth daily. 01/20/23 02/19/23  Rai, Delene Ruffini, MD  hydrocortisone (ANUSOL-HC) 25 MG suppository Place 1 suppository (25 mg total) rectally 2 (two) times daily. Patient taking differently: Place 25 mg rectally 2 (two) times daily as needed for hemorrhoids or anal itching. 08/18/22   Rhetta Mura, MD  hydrOXYzine (ATARAX) 25 MG tablet Take 0.5-1 tablets (12.5-25 mg total) by mouth every 8 (eight) hours as needed for itching. 10/19/22    Everrett Coombe, DO  lactulose, encephalopathy, (CHRONULAC) 10 GM/15ML SOLN Take 10 g by mouth daily as needed (constipation).    [provider]  magnesium oxide (MAG-OX) 400 MG tablet Take 1 tablet (400 mg total) by mouth 2 (two) times daily. 01/20/23   Rai, Ripudeep K, MD  ondansetron (ZOFRAN-ODT) 8 MG disintegrating tablet Dissolve 1 tablet (8 mg total) by mouth every 8 (eight) hours as needed for nausea. 01/20/23   Rai, Ripudeep K, MD  pantoprazole (PROTONIX) 40 MG tablet Take 1 tablet (40 mg total) by mouth daily. 11/18/22 11/18/23  Imogene Burn, MD  potassium chloride SA (KLOR-CON M) 20 MEQ tablet Take 1 tablet (20 mEq total) by mouth 2 (two) times daily. 01/20/23 02/19/23  Rai, Delene Ruffini, MD  prochlorperazine (COMPAZINE) 10 MG tablet Take 1 tablet (10 mg total) by mouth every 6 (six) hours as needed for nausea or vomiting. Patient taking differently: Take 10 mg by mouth as needed for nausea or vomiting. 07/01/22   Monica Becton, MD  saccharomyces boulardii (FLORASTOR) 250 MG capsule Take 250 mg by mouth 2 (two) times daily.     [provider]  triamcinolone cream (KENALOG) 0.1 % Apply 1 Application topically 2 (two) times daily. Patient taking differently: Apply 1 Application topically as needed (flares). 10/29/22   Everrett Coombe, DO  vortioxetine HBr (TRINTELLIX) 10 MG TABS tablet Take 1 tablet (10 mg total) by mouth daily. 11/22/22   Everrett Coombe, DO      Allergies    Apple juice, Cucumber extract, Depakote er [  divalproex sodium er], Depakote [valproic acid], Peanut butter flavor, Peanut oil, Peanut-containing drug products, Shellfish allergy, Shrimp extract, Strawberry extract, Apple, Cantaloupe extract allergy skin test, Codeine, Firvanq [vancomycin], and Lactose intolerance (gi)    Review of Systems   Review of Systems  Constitutional:  Negative for chills and fever.  HENT:  Negative for ear pain and sore throat.   Eyes:  Negative for pain and visual  disturbance.  Respiratory:  Negative for cough and shortness of breath.   Cardiovascular:  Negative for chest pain and palpitations.  Gastrointestinal:  Positive for blood in stool and hematochezia. Negative for abdominal pain and vomiting.  Genitourinary:  Negative for dysuria and hematuria.  Musculoskeletal:  Negative for arthralgias and back pain.  Skin:  Negative for color change and rash.  Neurological:  Positive for dizziness, weakness and light-headedness. Negative for seizures and syncope.  All other systems reviewed and are negative.   Physical Exam Updated Vital Signs BP 99/63 (BP Location: Right Arm)   Pulse 70   Temp 98.1 F (36.7 C) (Oral)   Resp 20   Ht 5\' 3"  (1.6 m)   Wt 75.8 kg   SpO2 100%   BMI 29.58 kg/m  Physical Exam Vitals and nursing note reviewed.  Constitutional:      General: He is not in acute distress.    Appearance: He is well-developed.  HENT:     Head: Normocephalic and atraumatic.  Eyes:     Conjunctiva/sclera: Conjunctivae normal.  Cardiovascular:     Rate and Rhythm: Normal rate and regular rhythm.     Heart sounds: No murmur heard. Pulmonary:     Effort: Pulmonary effort is normal. No respiratory distress.     Breath sounds: Normal breath sounds.  Abdominal:     Palpations: Abdomen is soft.     Tenderness: There is no abdominal tenderness.  Musculoskeletal:        General: No swelling.     Cervical back: Neck supple.  Skin:    General: Skin is warm and dry.     Capillary Refill: Capillary refill takes less than 2 seconds.  Neurological:     Mental Status: He is alert.  Psychiatric:        Mood and Affect: Mood normal.     ED Results / Procedures / Treatments   Labs (all labs ordered are listed, but only abnormal results are displayed) Labs Reviewed  CBC WITH DIFFERENTIAL/PLATELET - Abnormal; Notable for the following components:      Result Value   RBC 3.29 (*)    Hemoglobin 8.9 (*)    HCT 27.6 (*)    RDW 22.4 (*)     Platelets 74 (*)    All other components within normal limits  COMPREHENSIVE METABOLIC PANEL - Abnormal; Notable for the following components:   Sodium 131 (*)    Potassium 3.2 (*)    CO2 21 (*)    Glucose, Bld 131 (*)    Calcium 8.1 (*)    Albumin 2.7 (*)    AST 58 (*)    Total Bilirubin 3.6 (*)    All other components within normal limits  PROTIME-INR - Abnormal; Notable for the following components:   Prothrombin Time 22.0 (*)    INR 1.9 (*)    All other components within normal limits    EKG None  Radiology No results found.  Procedures Procedures    Medications Ordered in ED Medications  sodium chloride 0.9 % bolus 1,000 mL (  has no administration in time range)  acetaminophen (TYLENOL) tablet 650 mg (has no administration in time range)  ondansetron (ZOFRAN) injection 4 mg (has no administration in time range)  potassium chloride SA (KLOR-CON M) CR tablet 20 mEq (has no administration in time range)    ED Course/ Medical Decision Making/ A&P                             Medical Decision Making Amount and/or Complexity of Data Reviewed Labs: ordered.  Risk OTC drugs. Prescription drug management.   34:79 PM 57 year old male with past medical history of alcoholic liver cirrhosis with previous history of internal hemorrhoid banding presenting for complaints of hematochezia.  Hemoglobin of 8.9 today.  Previous studies show hemoglobin normally ranges in the 8-8.7 range.  Most recent laboratory study on 01/20/2023 demonstrates a hemoglobin of 8.0.  Likely secondary to anemia of chronic disease from liver cirrhosis.  INR is stable at 1.9.  Rectal exam demonstrates no external hemorrhoids.  No internal hemorrhoids palpated.  Positive for frank blood.  Patient also has a history of thrombocytopenia.  Most previous records on 01/20/2023 had a platelet level of 53.  Today's results are higher at 74.  At this time patient does not meet criteria for transfusion as he is above  6 with active bleeding.  I reached out to patient's established GI group, [facility],         Final Clinical Impression(s) / ED Diagnoses Final diagnoses:  None    Rx / DC Orders ED Discharge Orders     None         Franne Forts, DO 02/23/23 1534

## 2023-02-17 NOTE — Telephone Encounter (Signed)
FYI: [facility] called.  Patient admitted today for rectal bleeding.

## 2023-02-17 NOTE — ED Triage Notes (Signed)
Pt states he has had rectal bleeding x 2 days, both bright and dark red.  Pt c/o being tired, cold and shaky.  No thinners.  Pt has needed transfusions in the past, feels similarly.

## 2023-02-17 NOTE — Telephone Encounter (Signed)
can you try to get one of the 7 day holds for him soon? it is a ER follow-up rectal bleeding but he also has cirrhosis

## 2023-02-17 NOTE — ED Notes (Addendum)
disregard note

## 2023-02-18 NOTE — Telephone Encounter (Signed)
The pt has been scheduled for 02/25/23 at 1030 am with Hyacinth Meeker PA.

## 2023-02-18 NOTE — Telephone Encounter (Signed)
The patient has been notified of this information and all questions answered.

## 2023-02-21 ENCOUNTER — Ambulatory Visit: Payer: Commercial Managed Care - PPO | Admitting: Sports Medicine

## 2023-02-21 DIAGNOSIS — H16142 Punctate keratitis, left eye: Secondary | ICD-10-CM | POA: Diagnosis not present

## 2023-02-21 DIAGNOSIS — H17812 Minor opacity of cornea, left eye: Secondary | ICD-10-CM | POA: Diagnosis not present

## 2023-02-23 NOTE — Telephone Encounter (Signed)
Still waiting for approval

## 2023-02-25 ENCOUNTER — Ambulatory Visit: Payer: Self-pay | Admitting: Physician Assistant

## 2023-03-02 ENCOUNTER — Ambulatory Visit (INDEPENDENT_AMBULATORY_CARE_PROVIDER_SITE_OTHER): Payer: Commercial Managed Care - PPO | Admitting: Family Medicine

## 2023-03-02 ENCOUNTER — Encounter: Payer: Self-pay | Admitting: Family Medicine

## 2023-03-02 VITALS — BP 115/62 | HR 94 | Ht 63.0 in | Wt 160.3 lb

## 2023-03-02 DIAGNOSIS — H6992 Unspecified Eustachian tube disorder, left ear: Secondary | ICD-10-CM

## 2023-03-02 DIAGNOSIS — K921 Melena: Secondary | ICD-10-CM

## 2023-03-02 DIAGNOSIS — K7030 Alcoholic cirrhosis of liver without ascites: Secondary | ICD-10-CM

## 2023-03-02 DIAGNOSIS — K7031 Alcoholic cirrhosis of liver with ascites: Secondary | ICD-10-CM | POA: Diagnosis not present

## 2023-03-02 DIAGNOSIS — H6990 Unspecified Eustachian tube disorder, unspecified ear: Secondary | ICD-10-CM

## 2023-03-02 HISTORY — DX: Unspecified eustachian tube disorder, unspecified ear: H69.90

## 2023-03-02 NOTE — Assessment & Plan Note (Signed)
Continues to see GI/hepatology.  Updating LFT's.

## 2023-03-02 NOTE — Progress Notes (Signed)
Juan [name] - 57 y.o. male MRN [id number]  Date of birth: Jun 07, 1966  Subjective Chief Complaint  Patient presents with   Hospitalization Follow-up    HPI Juan [name] is a 57 y.o. male here today for follow up visit.   Recent hospitalization for BRBPR.  He also had COVID a couple of weeks ago.  He has recovered well from COVID.  Still with feeling of fluid in his ears.  He denies fever or chills.  He did have 1 episode of rectal bleeding after diarrhea a few days ago but this has resolved.  He denies fever, chills, headache, abdominal pain and/or dizziness.  He is aware of bleeding precautions.   ROS:  A comprehensive ROS was completed and negative except as noted per HPI  Allergies  Allergen Reactions   Apple Juice Swelling and Other (See Comments)    Tongue swelling   Cucumber Extract Itching and Nausea And Vomiting    No extracts; just cucumber   Depakote Er [Divalproex Sodium Er] Swelling and Other (See Comments)    Tongue swelling   Depakote [Valproic Acid] Anaphylaxis   Peanut Butter Flavor Anaphylaxis and Swelling   Peanut Oil Swelling   Peanut-Containing Drug Products Swelling   Shellfish Allergy Itching and Swelling   Shrimp Extract Itching and Swelling   Strawberry Extract Nausea And Vomiting and Swelling   Apple Swelling   Cantaloupe Extract Allergy Skin Test Rash   Codeine Itching, Rash and Other (See Comments)    Patient reports he can take "CODONES" without problems   Firvanq [Vancomycin] Rash   Lactose Intolerance (Gi) Diarrhea and Other (See Comments)    Indigestion, Stomach pain, Flatulence    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   Chronic fatigue    Cirrhosis (HCC)    Colon polyps    Depression    GERD (gastroesophageal reflux disease)    Hemochromatosis    Hiatal hernia 02/20/2022   Hx  of blood clots    Leg   Hyperreflexia    Hypertension    PTSD (post-traumatic stress disorder)    Traumatic hemorrhagic shock [facility])     Past Surgical History:  Procedure Laterality Date   BIOPSY  09/24/2021   Procedure: BIOPSY;  Surgeon: Imogene Burn, MD;  Location: [facility] ENDOSCOPY;  Service: Gastroenterology;;   Fidela Salisbury RELEASE Bilateral    COLONOSCOPY WITH PROPOFOL N/A 09/24/2021   Procedure: COLONOSCOPY WITH PROPOFOL;  Surgeon: Imogene Burn, MD;  Location[facility] ENDOSCOPY;  Service: Gastroenterology;  Laterality: N/A;   ESOPHAGOGASTRODUODENOSCOPY (EGD) WITH PROPOFOL N/A 09/24/2021   Procedure: ESOPHAGOGASTRODUODENOSCOPY (EGD) WITH PROPOFOL;  Surgeon: Imogene Burn, MD;  Location: [facility] ENDOSCOPY;  Service: Gastroenterology;  Laterality: N/A;   ESOPHAGOGASTRODUODENOSCOPY (EGD) WITH PROPOFOL N/A 08/18/2022   Procedure: ESOPHAGOGASTRODUODENOSCOPY (EGD) WITH PROPOFOL;  Surgeon: Hilarie Fredrickson, MD;  Location: [facility] ENDOSCOPY;  Service: Gastroenterology;  Laterality: N/A;   FLEXIBLE SIGMOIDOSCOPY N/A 08/18/2022   Procedure: FLEXIBLE SIGMOIDOSCOPY;  Surgeon: Hilarie Fredrickson, MD;  Location: [facility] ENDOSCOPY;  Service: Gastroenterology;  Laterality: N/A;   FRACTURE SURGERY     left ankle plate   HERNIA REPAIR     inguinal   KNEE SURGERY Right    x 4   SHOULDER SURGERY Bilateral    x 2   VASECTOMY      Social History  Socioeconomic History   Marital status: Married    Spouse name: [name]   Number of children: 2   Years of education: Not on file   Highest education level: 12th grade  Occupational History    Comment: disability  Tobacco Use   Smoking status: Never   Smokeless tobacco: Current    Types: Snuff  Vaping Use   Vaping Use: Never used  Substance and Sexual Activity   Alcohol use: Not Currently   Drug use: Never   Sexual activity: Not on file  Other Topics Concern   Not on file  Social History Narrative   Lives with wife   Social Determinants of Health   Financial  Resource Strain: Medium Risk (01/09/2023)   Overall Financial Resource Strain (CARDIA)    Difficulty of Paying Living Expenses: Somewhat hard  Food Insecurity: No Food Insecurity (01/19/2023)   Hunger Vital Sign    Worried About Running Out of Food in the Last Year: Never true    Ran Out of Food in the Last Year: Never true  Transportation Needs: No Transportation Needs (01/19/2023)   PRAPARE - Administrator, Civil Service (Medical): No    Lack of Transportation (Non-Medical): No  Physical Activity: Insufficiently Active (01/09/2023)   Exercise Vital Sign    Days of Exercise per Week: 1 day    Minutes of Exercise per Session: 10 min  Stress: Stress Concern Present (01/09/2023)   [employer] of Occupational Health - Occupational Stress Questionnaire    Feeling of Stress : To some extent  Social Connections: Moderately Integrated (01/09/2023)   Social Connection and Isolation Panel [NHANES]    Frequency of Communication with Friends and Family: More than three times a week    Frequency of Social Gatherings with Friends and Family: Once a week    Attends Religious Services: More than 4 times per year    Active Member of Clubs or Organizations: No    Attends Engineer, structural: Not on file    Marital Status: Married    Family History  Problem Relation Age of Onset   Pulmonary fibrosis Mother    Hypertension Father    Other Father        liver failure   Diabetes Brother    Breast cancer Paternal Aunt    Colon cancer Neg Hx    Esophageal cancer Neg Hx    Stomach cancer Neg Hx    Rectal cancer Neg Hx     Health Maintenance  Topic Date Due   COVID-19 Vaccine (4 - 2023-24 season) 06/18/2023 (Originally 04/23/2022)   INFLUENZA VACCINE  03/24/2023   DTaP/Tdap/Td (3 - Td or Tdap) 06/20/2029   Colonoscopy  09/25/2031   Hepatitis C Screening  Completed   HIV Screening  Completed   Zoster Vaccines- Shingrix  Completed   HPV VACCINES  Aged Out      ----------------------------------------------------------------------------------------------------------------------------------------------------------------------------------------------------------------- Physical Exam BP 115/62 (BP Location: Right Arm, Patient Position: Sitting, Cuff Size: Normal)   Pulse 94   Ht 5\' 3"  (1.6 m)   Wt 160 lb 4.8 oz (72.7 kg)   SpO2 100%   BMI 28.40 kg/m   Physical Exam Constitutional:      Appearance: Normal appearance.  HENT:     Head: Normocephalic and atraumatic.     Ears:     Comments: Serous effusion, L ear.  Eyes:     General: No scleral icterus. Cardiovascular:     Rate and Rhythm: Normal rate and  regular rhythm.  Pulmonary:     Effort: Pulmonary effort is normal.     Breath sounds: Normal breath sounds.  Neurological:     Mental Status: He is alert.  Psychiatric:        Mood and Affect: Mood normal.        Behavior: Behavior normal.     ------------------------------------------------------------------------------------------------------------------------------------------------------------------------------------------------------------------- Assessment and Plan  GI bleed Recurrent, related to hemorrhoids and thrombocytopenia.  Update CBC and ferritin  Cirrhosis of liver (HCC) Continues to see GI/hepatology.  Updating LFT's.   Eustachian tube dysfunction Recommend adding OTC flonase daily.    No orders of the defined types were placed in this encounter.   No follow-ups on file.    This visit occurred during the SARS-CoV-2 public health emergency.  Safety protocols were in place, including screening questions prior to the visit, additional usage of staff PPE, and extensive cleaning of exam room while observing appropriate contact time as indicated for disinfecting solutions.

## 2023-03-02 NOTE — Assessment & Plan Note (Signed)
Recurrent, related to hemorrhoids and thrombocytopenia.  Update CBC and ferritin

## 2023-03-02 NOTE — Assessment & Plan Note (Signed)
Recommend adding OTC flonase daily.

## 2023-03-02 NOTE — Addendum Note (Signed)
Addended by: Mammie Lorenzo on: 03/02/2023 09:54 AM   Modules accepted: Orders

## 2023-03-03 LAB — CMP14+EGFR
ALT: 24 IU/L (ref 0–44)
AST: 81 IU/L — ABNORMAL HIGH (ref 0–40)
Albumin: 3.5 g/dL — ABNORMAL LOW (ref 3.8–4.9)
Alkaline Phosphatase: 97 IU/L (ref 44–121)
BUN/Creatinine Ratio: 10 (ref 9–20)
BUN: 9 mg/dL (ref 6–24)
Bilirubin Total: 4.7 mg/dL — ABNORMAL HIGH (ref 0.0–1.2)
CO2: 22 mmol/L (ref 20–29)
Calcium: 9.1 mg/dL (ref 8.7–10.2)
Chloride: 101 mmol/L (ref 96–106)
Creatinine, Ser: 0.93 mg/dL (ref 0.76–1.27)
Globulin, Total: 3.3 g/dL (ref 1.5–4.5)
Glucose: 105 mg/dL — ABNORMAL HIGH (ref 70–99)
Potassium: 3.2 mmol/L — ABNORMAL LOW (ref 3.5–5.2)
Sodium: 137 mmol/L (ref 134–144)
Total Protein: 6.8 g/dL (ref 6.0–8.5)
eGFR: 97 mL/min/{1.73_m2} (ref >59)

## 2023-03-03 LAB — IRON,TIBC AND FERRITIN PANEL
Ferritin: 43 ng/mL (ref 30–400)
Iron Saturation: 12 % — ABNORMAL LOW (ref 15–55)
Iron: 39 ug/dL (ref 38–169)
Total Iron Binding Capacity: 323 ug/dL (ref 250–450)
UIBC: 284 ug/dL (ref 111–343)

## 2023-03-03 LAB — CBC WITH DIFFERENTIAL/PLATELET
Basophils Absolute: 0.1 10*3/uL (ref 0.0–0.2)
Basos: 1 %
EOS (ABSOLUTE): 0.4 10*3/uL (ref 0.0–0.4)
Eos: 7 %
Hematocrit: 27.7 % — ABNORMAL LOW (ref 37.5–51.0)
Hemoglobin: 9.2 g/dL — ABNORMAL LOW (ref 13.0–17.7)
Immature Grans (Abs): 0 10*3/uL (ref 0.0–0.1)
Immature Granulocytes: 0 %
Lymphocytes Absolute: 1.3 10*3/uL (ref 0.7–3.1)
Lymphs: 25 %
MCH: 26.8 pg (ref 26.6–33.0)
MCHC: 33.2 g/dL (ref 31.5–35.7)
MCV: 81 fL (ref 79–97)
Monocytes Absolute: 0.6 10*3/uL (ref 0.1–0.9)
Monocytes: 13 %
Neutrophils Absolute: 2.6 10*3/uL (ref 1.4–7.0)
Neutrophils: 54 %
RBC: 3.43 x10E6/uL — ABNORMAL LOW (ref 4.14–5.80)
RDW: 19.9 % — ABNORMAL HIGH (ref 11.6–15.4)
WBC: 5 10*3/uL (ref 3.4–10.8)

## 2023-03-03 NOTE — Telephone Encounter (Signed)
Patient PA was approved and notified of it and states will call back and get scheduled.

## 2023-03-09 DIAGNOSIS — H524 Presbyopia: Secondary | ICD-10-CM | POA: Diagnosis not present

## 2023-03-10 ENCOUNTER — Other Ambulatory Visit: Payer: Self-pay

## 2023-03-10 ENCOUNTER — Ambulatory Visit (INDEPENDENT_AMBULATORY_CARE_PROVIDER_SITE_OTHER): Payer: Commercial Managed Care - PPO

## 2023-03-10 ENCOUNTER — Encounter: Payer: Self-pay | Admitting: Family Medicine

## 2023-03-10 ENCOUNTER — Ambulatory Visit: Payer: Commercial Managed Care - PPO | Admitting: Family Medicine

## 2023-03-10 ENCOUNTER — Other Ambulatory Visit (HOSPITAL_BASED_OUTPATIENT_CLINIC_OR_DEPARTMENT_OTHER): Payer: Self-pay

## 2023-03-10 VITALS — BP 115/66 | HR 102 | Ht 63.0 in | Wt 172.0 lb

## 2023-03-10 DIAGNOSIS — K709 Alcoholic liver disease, unspecified: Secondary | ICD-10-CM

## 2023-03-10 DIAGNOSIS — M25461 Effusion, right knee: Secondary | ICD-10-CM

## 2023-03-10 DIAGNOSIS — K921 Melena: Secondary | ICD-10-CM

## 2023-03-10 DIAGNOSIS — W19XXXA Unspecified fall, initial encounter: Secondary | ICD-10-CM

## 2023-03-10 DIAGNOSIS — R29898 Other symptoms and signs involving the musculoskeletal system: Secondary | ICD-10-CM | POA: Diagnosis not present

## 2023-03-10 DIAGNOSIS — M25561 Pain in right knee: Secondary | ICD-10-CM | POA: Diagnosis not present

## 2023-03-10 DIAGNOSIS — Z9889 Other specified postprocedural states: Secondary | ICD-10-CM

## 2023-03-10 HISTORY — DX: Pain in right knee: M25.561

## 2023-03-10 NOTE — Assessment & Plan Note (Signed)
He has had prior ACL tear.  Concern for internal derangement of the knee based on exam findings as well as effusion.  X-rays ordered today.  Placed in knee immobilizer.  Will go ahead and order MRI as well.  Recommend icing at home.

## 2023-03-10 NOTE — Progress Notes (Signed)
Juan [name] - 57 y.o. male MRN [id number]  Date of birth: [birth year]  Subjective Chief Complaint  Patient presents with   Knee Injury   Edema    HPI Juan [name] is a 57 year old male here today with complaint of right knee pain.  He had a fall yesterday while ambulating without his walker.  He did fall directly on the knee with some twisting sensation as well.  He has had some swelling of the knee.  They have been using a knee brace.  Has some pain with weightbearing.  Has not been icing so far.  He has had multiple surgeries on the right knee including ACL repair x 2 as well as meniscal repair.  He is scheduled to have viscosupplementation injections with Dr. Benjamin Stain next week.  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   Chronic fatigue    Cirrhosis (HCC)    Colon polyps    Depression    GERD (gastroesophageal reflux disease)    Hemochromatosis    Hiatal hernia 02/20/2022   Hx of blood clots    Leg   Hyperreflexia    Hypertension    PTSD (post-traumatic stress disorder)    Traumatic hemorrhagic shock [facility])     Past Surgical History:  Procedure Laterality Date   BIOPSY  09/24/2021   Procedure: BIOPSY;  Surgeon: Imogene Burn, MD;  Location: [facility] ENDOSCOPY;  Service: Gastroenterology;;   Fidela Salisbury RELEASE Bilateral    COLONOSCOPY WITH PROPOFOL N/A 09/24/2021   Procedure: COLONOSCOPY WITH PROPOFOL;  Surgeon: Imogene Burn, MD;  Location: [facility] ENDOSCOPY;  Service: Gastroenterology;  Laterality: N/A;   ESOPHAGOGASTRODUODENOSCOPY (EGD) WITH PROPOFOL N/A 09/24/2021   Procedure: ESOPHAGOGASTRODUODENOSCOPY (EGD) WITH PROPOFOL;  Surgeon: Imogene Burn, MD;  Location: [facility] ENDOSCOPY;  Service: Gastroenterology;  Laterality: N/A;   ESOPHAGOGASTRODUODENOSCOPY  (EGD) WITH PROPOFOL N/A 08/18/2022   Procedure: ESOPHAGOGASTRODUODENOSCOPY (EGD) WITH PROPOFOL;  Surgeon: Hilarie Fredrickson, MD;  Location: [facility] ENDOSCOPY;  Service: Gastroenterology;  Laterality: N/A;   FLEXIBLE SIGMOIDOSCOPY N/A 08/18/2022   Procedure: FLEXIBLE SIGMOIDOSCOPY;  Surgeon: Hilarie Fredrickson, MD;  Location: [facility] ENDOSCOPY;  Service: Gastroenterology;  Laterality: N/A;   FRACTURE SURGERY     left ankle plate   HERNIA REPAIR     inguinal   KNEE SURGERY Right    x 4   SHOULDER SURGERY Bilateral    x 2   VASECTOMY      Social History   Socioeconomic History   Marital status: Married    Spouse name: [name]   Number of children: 2   Years of education: Not on file   Highest education level: 12th grade  Occupational History    Comment: disability  Tobacco Use   Smoking status: Never   Smokeless tobacco: Current    Types: Snuff  Vaping Use   Vaping status: Never Used  Substance and Sexual Activity   Alcohol use: Not Currently   Drug use: Never   Sexual activity: Not on file  Other Topics Concern   Not on file  Social History Narrative   Lives with wife   Social Determinants of Health   Financial Resource Strain: Medium Risk (01/09/2023)   Overall Financial Resource Strain (CARDIA)    Difficulty of Paying Living Expenses:  Somewhat hard  Food Insecurity: No Food Insecurity (01/19/2023)   Hunger Vital Sign    Worried About Running Out of Food in the Last Year: Never true    Ran Out of Food in the Last Year: Never true  Transportation Needs: No Transportation Needs (01/19/2023)   PRAPARE - Administrator, Civil Service (Medical): No    Lack of Transportation (Non-Medical): No  Physical Activity: Insufficiently Active (01/09/2023)   Exercise Vital Sign    Days of Exercise per Week: 1 day    Minutes of Exercise per Session: 10 min  Stress: Stress Concern Present (01/09/2023)   [employer] of Occupational Health - Occupational Stress Questionnaire     Feeling of Stress : To some extent  Social Connections: Moderately Integrated (01/09/2023)   Social Connection and Isolation Panel [NHANES]    Frequency of Communication with Friends and Family: More than three times a week    Frequency of Social Gatherings with Friends and Family: Once a week    Attends Religious Services: More than 4 times per year    Active Member of Clubs or Organizations: No    Attends Engineer, structural: Not on file    Marital Status: Married    Family History  Problem Relation Age of Onset   Pulmonary fibrosis Mother    Hypertension Father    Other Father        liver failure   Diabetes Brother    Breast cancer Paternal Aunt    Colon cancer Neg Hx    Esophageal cancer Neg Hx    Stomach cancer Neg Hx    Rectal cancer Neg Hx     Health Maintenance  Topic Date Due   COVID-19 Vaccine (4 - 2023-24 season) 06/18/2023 (Originally 04/23/2022)   INFLUENZA VACCINE  03/24/2023   DTaP/Tdap/Td (3 - Td or Tdap) 06/20/2029   Colonoscopy  09/25/2031   Hepatitis C Screening  Completed   HIV Screening  Completed   Zoster Vaccines- Shingrix  Completed   HPV VACCINES  Aged Out     ----------------------------------------------------------------------------------------------------------------------------------------------------------------------------------------------------------------- Physical Exam BP 115/66 (BP Location: Left Arm, Patient Position: Sitting, Cuff Size: Normal)   Pulse (!) 102   Ht 5\' 3"  (1.6 m)   Wt 172 lb (78 kg)   SpO2 97%   BMI 30.47 kg/m   Physical Exam Constitutional:      Appearance: Normal appearance.  HENT:     Head: Normocephalic and atraumatic.  Eyes:     General: Scleral icterus present.  Musculoskeletal:     Cervical back: Neck supple.     Comments: Right knee with effusion.  Tenderness to palpation throughout most of his knee.  Is worse along the joint line.  He has laxity on [place].  This is increased compared to  the left side.    Neurological:     Mental Status: He is alert.  Psychiatric:        Mood and Affect: Mood normal.        Behavior: Behavior normal.     ------------------------------------------------------------------------------------------------------------------------------------------------------------------------------------------------------------------- Assessment and Plan  Acute pain of right knee He has had prior ACL tear.  Concern for internal derangement of the knee based on exam findings as well as effusion.  X-rays ordered today.  Placed in knee immobilizer.  Will go ahead and order MRI as well.  Recommend icing at home.  Chronic alcoholic liver disease (HCC) We discussed working on increasing his protein intake.  His diet mainly consist  of Goldfish crackers most of the time.  He complains of things that he can incorporate including yogurt, eggs and/or protein shakes.  GI bleed He did have an episode with a small amount of bleeding earlier today.  He is aware that he should seek emergency care if having increased bleeding.   No orders of the defined types were placed in this encounter.   No follow-ups on file.    This visit occurred during the SARS-CoV-2 public health emergency.  Safety protocols were in place, including screening questions prior to the visit, additional usage of staff PPE, and extensive cleaning of exam room while observing appropriate contact time as indicated for disinfecting solutions.

## 2023-03-10 NOTE — Assessment & Plan Note (Signed)
He did have an episode with a small amount of bleeding earlier today.  He is aware that he should seek emergency care if having increased bleeding.

## 2023-03-10 NOTE — Assessment & Plan Note (Signed)
We discussed working on increasing his protein intake.  His diet mainly consist of Goldfish crackers most of the time.  He complains of things that he can incorporate including yogurt, eggs and/or protein shakes.

## 2023-03-11 ENCOUNTER — Other Ambulatory Visit (HOSPITAL_BASED_OUTPATIENT_CLINIC_OR_DEPARTMENT_OTHER): Payer: Self-pay

## 2023-03-12 ENCOUNTER — Other Ambulatory Visit: Payer: Commercial Managed Care - PPO

## 2023-03-13 ENCOUNTER — Ambulatory Visit (INDEPENDENT_AMBULATORY_CARE_PROVIDER_SITE_OTHER): Payer: Commercial Managed Care - PPO

## 2023-03-13 DIAGNOSIS — M1711 Unilateral primary osteoarthritis, right knee: Secondary | ICD-10-CM | POA: Diagnosis not present

## 2023-03-13 DIAGNOSIS — M25561 Pain in right knee: Secondary | ICD-10-CM

## 2023-03-13 DIAGNOSIS — Z9889 Other specified postprocedural states: Secondary | ICD-10-CM

## 2023-03-13 DIAGNOSIS — R29898 Other symptoms and signs involving the musculoskeletal system: Secondary | ICD-10-CM

## 2023-03-13 DIAGNOSIS — W19XXXA Unspecified fall, initial encounter: Secondary | ICD-10-CM | POA: Diagnosis not present

## 2023-03-13 DIAGNOSIS — S8991XA Unspecified injury of right lower leg, initial encounter: Secondary | ICD-10-CM | POA: Diagnosis not present

## 2023-03-13 DIAGNOSIS — M25461 Effusion, right knee: Secondary | ICD-10-CM

## 2023-03-14 ENCOUNTER — Ambulatory Visit: Payer: No Typology Code available for payment source | Admitting: Sports Medicine

## 2023-03-14 ENCOUNTER — Telehealth: Payer: Self-pay | Admitting: Family Medicine

## 2023-03-14 NOTE — Telephone Encounter (Signed)
Pt called. Is he able to drive?

## 2023-03-17 ENCOUNTER — Other Ambulatory Visit: Payer: Self-pay | Admitting: Family Medicine

## 2023-03-17 DIAGNOSIS — R0781 Pleurodynia: Secondary | ICD-10-CM

## 2023-03-17 DIAGNOSIS — W19XXXA Unspecified fall, initial encounter: Secondary | ICD-10-CM

## 2023-03-18 ENCOUNTER — Other Ambulatory Visit (INDEPENDENT_AMBULATORY_CARE_PROVIDER_SITE_OTHER): Payer: Commercial Managed Care - PPO

## 2023-03-18 ENCOUNTER — Ambulatory Visit (INDEPENDENT_AMBULATORY_CARE_PROVIDER_SITE_OTHER): Payer: Commercial Managed Care - PPO | Admitting: Sports Medicine

## 2023-03-18 ENCOUNTER — Other Ambulatory Visit (HOSPITAL_BASED_OUTPATIENT_CLINIC_OR_DEPARTMENT_OTHER): Payer: Self-pay

## 2023-03-18 ENCOUNTER — Ambulatory Visit: Payer: Commercial Managed Care - PPO

## 2023-03-18 DIAGNOSIS — S299XXA Unspecified injury of thorax, initial encounter: Secondary | ICD-10-CM

## 2023-03-18 DIAGNOSIS — M1731 Unilateral post-traumatic osteoarthritis, right knee: Secondary | ICD-10-CM

## 2023-03-18 DIAGNOSIS — S2231XD Fracture of one rib, right side, subsequent encounter for fracture with routine healing: Secondary | ICD-10-CM | POA: Diagnosis not present

## 2023-03-18 DIAGNOSIS — J9811 Atelectasis: Secondary | ICD-10-CM | POA: Diagnosis not present

## 2023-03-18 HISTORY — DX: Unspecified injury of thorax, initial encounter: S29.9XXA

## 2023-03-18 MED ORDER — HYALURONAN 30 MG/2ML IX SOSY
30.0000 mg | PREFILLED_SYRINGE | Freq: Once | INTRA_ARTICULAR | Status: AC
Start: 2023-03-18 — End: 2023-03-18
  Administered 2023-03-18: 30 mg via INTRA_ARTICULAR

## 2023-03-18 MED ORDER — HYDROCODONE-ACETAMINOPHEN 10-325 MG PO TABS
1.0000 | ORAL_TABLET | Freq: Three times a day (TID) | ORAL | 0 refills | Status: DC | PRN
  Filled 2023-03-18: qty 15, 5d supply, fill #0

## 2023-03-18 NOTE — Assessment & Plan Note (Signed)
Orthovisc 1 of 4 right knee, return in 1 week for #2 of 4. Of note I did personally review his MRI, he is medial meniscal deficient, and I do not see any new tearing in the lateral meniscus, there is also widespread osteoarthritis with areas of full-thickness cartilage loss predominantly medial compartment.

## 2023-03-18 NOTE — Assessment & Plan Note (Signed)
Recent accidental fall, bent forward and lost his balance, fell directly on his chest, he now has pain anterior midclavicular line approximately third through fifth ribs, adding a chest x-ray and high-dose hydrocodone. They do understand the hydrocodone will be short-term only.

## 2023-03-18 NOTE — Progress Notes (Signed)
    Procedures performed today:    I did personally review his MRI, he is medial meniscal deficient, and I do not see any new tearing in the lateral meniscus, there is also widespread osteoarthritis with areas of full-thickness cartilage loss predominantly medial compartment.  ACL graft does appear intact.  Independent interpretation of notes and tests performed by another provider:   Procedure: Real-time Ultrasound Guided injection of the right knee Device: Samsung HS60  Verbal informed consent obtained.  Time-out conducted.  Noted no overlying erythema, induration, or other signs of local infection.  Skin prepped in a sterile fashion.  Local anesthesia: Topical Ethyl chloride.  With sterile technique and under real time ultrasound guidance: Trace effusion noted, 30 mg/2 mL of OrthoVisc (sodium hyaluronate) in a prefilled syringe was injected easily into the knee through a 22-gauge needle. Completed without difficulty  Advised to call if fevers/chills, erythema, induration, drainage, or persistent bleeding.  Images permanently stored and available for review in PACS.  Impression: Technically successful ultrasound guided injection.  Brief History, Exam, Impression, and Recommendations:    Post-traumatic osteoarthritis of right knee Orthovisc 1 of 4 right knee, return in 1 week for #2 of 4. Of note I did personally review his MRI, he is medial meniscal deficient, and I do not see any new tearing in the lateral meniscus, there is also widespread osteoarthritis with areas of full-thickness cartilage loss predominantly medial compartment.  Chest trauma Recent accidental fall, bent forward and lost his balance, fell directly on his chest, he now has pain anterior midclavicular line approximately third through fifth ribs, adding a chest x-ray and high-dose hydrocodone. They do understand the hydrocodone will be short-term only.    ____________________________________________ Juan [name].  Benjamin Stain, M.D., ABFM., CAQSM., AME. Primary Care and Sports Medicine [facility]  Adjunct Professor of Family Medicine  [employer] [employer]  Restaurant manager, fast food

## 2023-03-18 NOTE — Addendum Note (Signed)
Addended by: Carren Rang A on: 03/18/2023 10:24 AM   Modules accepted: Orders

## 2023-03-24 ENCOUNTER — Other Ambulatory Visit (HOSPITAL_BASED_OUTPATIENT_CLINIC_OR_DEPARTMENT_OTHER): Payer: Self-pay

## 2023-03-24 MED ORDER — HYDROCODONE-ACETAMINOPHEN 7.5-325 MG PO TABS
1.0000 | ORAL_TABLET | ORAL | 0 refills | Status: DC
  Filled 2023-03-24: qty 12, 2d supply, fill #0

## 2023-03-24 MED ORDER — DIAZEPAM 10 MG PO TABS
ORAL_TABLET | ORAL | 0 refills | Status: DC
  Filled 2023-03-24: qty 3, 1d supply, fill #0

## 2023-03-29 ENCOUNTER — Other Ambulatory Visit (HOSPITAL_BASED_OUTPATIENT_CLINIC_OR_DEPARTMENT_OTHER): Payer: Self-pay

## 2023-03-29 MED ORDER — DIAZEPAM 10 MG PO TABS
10.0000 mg | ORAL_TABLET | Freq: Every day | ORAL | 0 refills | Status: DC
  Filled 2023-03-29: qty 3, 3d supply, fill #0

## 2023-03-29 MED ORDER — AMOXICILLIN 500 MG PO CAPS
500.0000 mg | ORAL_CAPSULE | Freq: Four times a day (QID) | ORAL | 0 refills | Status: DC
  Filled 2023-03-29: qty 29, 8d supply, fill #0

## 2023-03-29 MED ORDER — PREDNISONE 10 MG PO TABS
10.0000 mg | ORAL_TABLET | ORAL | 0 refills | Status: DC
  Filled 2023-03-29: qty 1, 1d supply, fill #0

## 2023-03-29 MED ORDER — HYDROCODONE-ACETAMINOPHEN 7.5-325 MG PO TABS
1.0000 | ORAL_TABLET | ORAL | 0 refills | Status: DC
  Filled 2023-03-29: qty 12, 2d supply, fill #0

## 2023-03-30 ENCOUNTER — Ambulatory Visit (INDEPENDENT_AMBULATORY_CARE_PROVIDER_SITE_OTHER): Payer: Commercial Managed Care - PPO | Admitting: Sports Medicine

## 2023-03-30 ENCOUNTER — Other Ambulatory Visit (INDEPENDENT_AMBULATORY_CARE_PROVIDER_SITE_OTHER): Payer: Commercial Managed Care - PPO

## 2023-03-30 DIAGNOSIS — M1731 Unilateral post-traumatic osteoarthritis, right knee: Secondary | ICD-10-CM

## 2023-03-30 DIAGNOSIS — S299XXD Unspecified injury of thorax, subsequent encounter: Secondary | ICD-10-CM | POA: Diagnosis not present

## 2023-03-30 MED ORDER — HYALURONAN 30 MG/2ML IX SOSY
30.0000 mg | PREFILLED_SYRINGE | Freq: Once | INTRA_ARTICULAR | Status: AC
  Administered 2023-03-30: 30 mg via INTRA_ARTICULAR

## 2023-03-30 NOTE — Assessment & Plan Note (Signed)
Orthovisc No. 2 of 4 right knee, return in 1 week for #3 of 4. 

## 2023-03-30 NOTE — Progress Notes (Signed)
    Procedures performed today:    Procedure: Real-time Ultrasound Guided injection of the right knee Device: Samsung HS60  Verbal informed consent obtained.  Time-out conducted.  Noted no overlying erythema, induration, or other signs of local infection.  Skin prepped in a sterile fashion.  Local anesthesia: Topical Ethyl chloride.  With sterile technique and under real time ultrasound guidance: Trace effusion noted, 30 mg/2 mL of OrthoVisc (sodium hyaluronate) in a prefilled syringe was injected easily into the knee through a 22-gauge needle. Completed without difficulty  Advised to call if fevers/chills, erythema, induration, drainage, or persistent bleeding.  Images permanently stored and available for review in PACS.  Impression: Technically successful ultrasound guided injection.  Independent interpretation of notes and tests performed by another provider:   None.  Brief History, Exam, Impression, and Recommendations:    Post-traumatic osteoarthritis of right knee Orthovisc No. 2 of 4 right knee, return in 1 week for #3 of 4.  Chest trauma Now approximately 2 weeks post accidental fall, he had some pain anterior midclavicular line 3rd through 5th ribs, chest x-ray was negative for acute fracture, he did some high-dose hydrocodone, still has some discomfort, suspect more contusion, we will watch this for now.    ____________________________________________ Ihor Austin. Benjamin Stain, M.D., ABFM., CAQSM., AME. Primary Care and Sports Medicine [facility]  Adjunct Professor of Family Medicine  [employer]  Restaurant manager, fast food

## 2023-03-30 NOTE — Assessment & Plan Note (Signed)
Now approximately 2 weeks post accidental fall, he had some pain anterior midclavicular line 3rd through 5th ribs, chest x-ray was negative for acute fracture, he did some high-dose hydrocodone, still has some discomfort, suspect more contusion, we will watch this for now.

## 2023-04-03 ENCOUNTER — Emergency Department (HOSPITAL_BASED_OUTPATIENT_CLINIC_OR_DEPARTMENT_OTHER): Payer: No Typology Code available for payment source

## 2023-04-03 ENCOUNTER — Emergency Department (HOSPITAL_BASED_OUTPATIENT_CLINIC_OR_DEPARTMENT_OTHER)
Admission: EM | Admit: 2023-04-03 | Discharge: 2023-04-03 | Disposition: A | Discharge status: Home / Self Care | Payer: No Typology Code available for payment source | Attending: Emergency Medicine | Admitting: Emergency Medicine

## 2023-04-03 ENCOUNTER — Other Ambulatory Visit: Payer: Self-pay

## 2023-04-03 ENCOUNTER — Encounter (HOSPITAL_BASED_OUTPATIENT_CLINIC_OR_DEPARTMENT_OTHER): Payer: Self-pay | Admitting: Emergency Medicine

## 2023-04-03 DIAGNOSIS — R599 Enlarged lymph nodes, unspecified: Secondary | ICD-10-CM | POA: Diagnosis not present

## 2023-04-03 DIAGNOSIS — Z91010 Allergy to peanuts: Secondary | ICD-10-CM | POA: Diagnosis not present

## 2023-04-03 DIAGNOSIS — R4182 Altered mental status, unspecified: Secondary | ICD-10-CM | POA: Diagnosis not present

## 2023-04-03 DIAGNOSIS — D649 Anemia, unspecified: Secondary | ICD-10-CM | POA: Diagnosis not present

## 2023-04-03 DIAGNOSIS — Z79899 Other long term (current) drug therapy: Secondary | ICD-10-CM | POA: Insufficient documentation

## 2023-04-03 DIAGNOSIS — R1110 Vomiting, unspecified: Secondary | ICD-10-CM | POA: Diagnosis present

## 2023-04-03 DIAGNOSIS — K8020 Calculus of gallbladder without cholecystitis without obstruction: Secondary | ICD-10-CM | POA: Diagnosis not present

## 2023-04-03 DIAGNOSIS — E876 Hypokalemia: Secondary | ICD-10-CM | POA: Insufficient documentation

## 2023-04-03 DIAGNOSIS — D696 Thrombocytopenia, unspecified: Secondary | ICD-10-CM | POA: Insufficient documentation

## 2023-04-03 DIAGNOSIS — R42 Dizziness and giddiness: Secondary | ICD-10-CM | POA: Diagnosis not present

## 2023-04-03 DIAGNOSIS — K7460 Unspecified cirrhosis of liver: Secondary | ICD-10-CM | POA: Diagnosis not present

## 2023-04-03 DIAGNOSIS — R0989 Other specified symptoms and signs involving the circulatory and respiratory systems: Secondary | ICD-10-CM | POA: Diagnosis not present

## 2023-04-03 DIAGNOSIS — R531 Weakness: Secondary | ICD-10-CM | POA: Diagnosis not present

## 2023-04-03 DIAGNOSIS — I10 Essential (primary) hypertension: Secondary | ICD-10-CM | POA: Insufficient documentation

## 2023-04-03 LAB — CBC WITH DIFFERENTIAL/PLATELET
Abs Immature Granulocytes: 0.02 10*3/uL (ref 0.00–0.07)
Basophils Absolute: 0.1 10*3/uL (ref 0.0–0.1)
Basophils Relative: 1 %
Eosinophils Absolute: 0.1 10*3/uL (ref 0.0–0.5)
Eosinophils Relative: 2 %
HCT: 26.4 % — ABNORMAL LOW (ref 39.0–52.0)
Hemoglobin: 8.3 g/dL — ABNORMAL LOW (ref 13.0–17.0)
Immature Granulocytes: 0 %
Lymphocytes Relative: 26 %
Lymphs Abs: 1.6 10*3/uL (ref 0.7–4.0)
MCH: 25.5 pg — ABNORMAL LOW (ref 26.0–34.0)
MCHC: 31.4 g/dL (ref 30.0–36.0)
MCV: 81 fL (ref 80.0–100.0)
Monocytes Absolute: 1.2 10*3/uL — ABNORMAL HIGH (ref 0.1–1.0)
Monocytes Relative: 20 %
Neutro Abs: 2.9 10*3/uL (ref 1.7–7.7)
Neutrophils Relative %: 51 %
Platelets: 68 10*3/uL — ABNORMAL LOW (ref 150–400)
RBC: 3.26 MIL/uL — ABNORMAL LOW (ref 4.22–5.81)
RDW: 21.1 % — ABNORMAL HIGH (ref 11.5–15.5)
Smear Review: NORMAL
WBC: 5.9 10*3/uL (ref 4.0–10.5)
nRBC: 0 % (ref 0.0–0.2)

## 2023-04-03 LAB — URINALYSIS, W/ REFLEX TO CULTURE (INFECTION SUSPECTED)
Bilirubin Urine: NEGATIVE
Glucose, UA: NEGATIVE mg/dL
Hgb urine dipstick: NEGATIVE
Ketones, ur: NEGATIVE mg/dL
Leukocytes,Ua: NEGATIVE
Nitrite: NEGATIVE
Protein, ur: NEGATIVE mg/dL
RBC / HPF: NONE SEEN RBC/hpf (ref 0–5)
Specific Gravity, Urine: 1.02 (ref 1.005–1.030)
WBC, UA: NONE SEEN WBC/hpf (ref 0–5)
pH: 7 (ref 5.0–8.0)

## 2023-04-03 LAB — COMPREHENSIVE METABOLIC PANEL
ALT: 22 U/L (ref 0–44)
AST: 57 U/L — ABNORMAL HIGH (ref 15–41)
Albumin: 2.5 g/dL — ABNORMAL LOW (ref 3.5–5.0)
Alkaline Phosphatase: 86 U/L (ref 38–126)
Anion gap: 7 (ref 5–15)
BUN: 10 mg/dL (ref 6–20)
CO2: 25 mmol/L (ref 22–32)
Calcium: 8.2 mg/dL — ABNORMAL LOW (ref 8.9–10.3)
Chloride: 104 mmol/L (ref 98–111)
Creatinine, Ser: 0.83 mg/dL (ref 0.61–1.24)
GFR, Estimated: >60 mL/min (ref >60)
Glucose, Bld: 106 mg/dL — ABNORMAL HIGH (ref 70–99)
Potassium: 3.1 mmol/L — ABNORMAL LOW (ref 3.5–5.1)
Sodium: 136 mmol/L (ref 135–145)
Total Bilirubin: 2.3 mg/dL — ABNORMAL HIGH (ref 0.3–1.2)
Total Protein: 6.2 g/dL — ABNORMAL LOW (ref 6.5–8.1)

## 2023-04-03 LAB — PROTIME-INR
INR: 1.8 — ABNORMAL HIGH (ref 0.8–1.2)
Prothrombin Time: 21.2 seconds — ABNORMAL HIGH (ref 11.4–15.2)

## 2023-04-03 LAB — MAGNESIUM: Magnesium: 1.2 mg/dL — ABNORMAL LOW (ref 1.7–2.4)

## 2023-04-03 LAB — LIPASE, BLOOD: Lipase: 41 U/L (ref 11–51)

## 2023-04-03 LAB — AMMONIA: Ammonia: 38 umol/L — ABNORMAL HIGH (ref 9–35)

## 2023-04-03 MED ORDER — MAGNESIUM OXIDE -MG SUPPLEMENT 400 (240 MG) MG PO TABS
800.0000 mg | ORAL_TABLET | Freq: Once | ORAL | Status: AC
  Administered 2023-04-03: 800 mg via ORAL
  Filled 2023-04-03: qty 2

## 2023-04-03 MED ORDER — LACTATED RINGERS IV BOLUS
1000.0000 mL | Freq: Once | INTRAVENOUS | Status: AC
  Administered 2023-04-03: 1000 mL via INTRAVENOUS

## 2023-04-03 MED ORDER — IOHEXOL 300 MG/ML  SOLN
100.0000 mL | Freq: Once | INTRAMUSCULAR | Status: AC | PRN
  Administered 2023-04-03: 100 mL via INTRAVENOUS

## 2023-04-03 MED ORDER — POTASSIUM CHLORIDE CRYS ER 20 MEQ PO TBCR
40.0000 meq | EXTENDED_RELEASE_TABLET | Freq: Once | ORAL | Status: AC
  Administered 2023-04-03: 40 meq via ORAL
  Filled 2023-04-03: qty 2

## 2023-04-03 MED ORDER — MAGNESIUM SULFATE 4 GM/100ML IV SOLN
4.0000 g | Freq: Once | INTRAVENOUS | Status: AC
  Administered 2023-04-03: 4 g via INTRAVENOUS
  Filled 2023-04-03: qty 100

## 2023-04-03 NOTE — ED Triage Notes (Signed)
Pt with [facility] since yesterday; pt sts he feels lightheaded; wife thinks he has been somewhat confused and is concerned about his ammonia level; 3 falls this week, denies pain

## 2023-04-03 NOTE — Discharge Instructions (Signed)
Increase your magnesium to 3 tablets/day as well as increasing your potassium to 3 tablets/day over the next 3 days.  Follow-up with your primary care physician for recheck.  If you develop any other new or worsening or not improving symptoms return to the ER or call 911.

## 2023-04-03 NOTE — ED Provider Notes (Signed)
[facility] Provider Note   CSN: [id number] Arrival date & time: 04/03/23  1433     History  Chief Complaint  Patient presents with   Emesis    Kingsly Scurry is a 57 y.o. male.  HPI 57 year old male with a history of prior alcohol abuse, hypertension, cirrhosis, chronic alcoholic myopathy, MDD, and other comorbidities including prior GI bleeds presents with lightheadedness and vomiting.  History is from patient and wife.  5 days ago he had oral surgery which required some Valium and local anesthesia.  He states he was given hydrocodone but he is only been taking it once per day and has not taken the last couple days.  Has been constipated with minimal stool output since the surgery.  He is also developed vomiting yesterday.  He has been lightheaded today, feeling like he is going to pass out.  Over the last few days he is also fallen a couple times where it seems like his legs just give out from under him.  No injuries.  Seems a little out of it and confused to the wife which is how he has presented before when he had abnormal electrolytes but also hepatic encephalopathy.  She is given him lactulose a couple times but no change in the bowel movements.  No fevers during this time.  He has had a slight headache.  No vision complaints or new weakness/numbness in his extremities.  Has some chronic lower extremity issues and is being worked up for neuropathy. No recent alcohol use.  Home Medications Prior to Admission medications   Medication Sig Start Date End Date Taking? Authorizing Provider  amoxicillin (AMOXIL) 500 MG capsule Take 1 capsule (500 mg total) by mouth 4 (four) times daily. 03/29/23     busPIRone (BUSPAR) 15 MG tablet Take 1 tablet (15 mg total) by mouth 2 (two) times daily. 11/18/22   Everrett Coombe, DO  diazepam (VALIUM) 10 MG tablet Take 1 tablet by mouth as directed 2 hours before dental appointment and bring the rest with you.  03/29/23     diclofenac Sodium (VOLTAREN) 1 % GEL Apply 4 grams topically 4 (four) times daily to affected joint. Patient taking differently: Apply 4 g topically as needed. 02/08/23   Monica Becton, MD  furosemide (LASIX) 40 MG tablet Take 1 tablet (40 mg) by mouth daily. Patient taking differently: Take 40 mg by mouth as needed for edema. 01/20/23 02/19/23  Rai, Delene Ruffini, MD  HYDROcodone-acetaminophen (NORCO) 10-325 MG tablet Take 1 tablet by mouth every 8 (eight) hours as needed. 03/18/23   Monica Becton, MD  HYDROcodone-acetaminophen (NORCO) 7.5-325 MG tablet Take 1 tablet by mouth every four to six hours as needed for pain. 03/29/23     hydrocortisone (ANUSOL-HC) 25 MG suppository Place 1 suppository (25 mg total) rectally 2 (two) times daily. Patient taking differently: Place 25 mg rectally 2 (two) times daily as needed for hemorrhoids or anal itching. 08/18/22   Rhetta Mura, MD  hydrOXYzine (ATARAX) 25 MG tablet Take 0.5-1 tablets (12.5-25 mg total) by mouth every 8 (eight) hours as needed for itching. 10/19/22   Everrett Coombe, DO  lactulose, encephalopathy, (CHRONULAC) 10 GM/15ML SOLN Take 10 g by mouth daily as needed (constipation).    [provider]  magnesium oxide (MAG-OX) 400 MG tablet Take 1 tablet (400 mg total) by mouth 2 (two) times daily. 01/20/23   Rai, Ripudeep K, MD  ondansetron (ZOFRAN-ODT) 8 MG disintegrating tablet Dissolve  1 tablet (8 mg total) by mouth every 8 (eight) hours as needed for nausea. 01/20/23   Rai, Ripudeep K, MD  pantoprazole (PROTONIX) 40 MG tablet Take 1 tablet (40 mg total) by mouth daily. 11/18/22 11/18/23  Imogene Burn, MD  potassium chloride SA (KLOR-CON M) 20 MEQ tablet Take 1 tablet (20 mEq total) by mouth 2 (two) times daily. 01/20/23 06/09/23  Rai, Delene Ruffini, MD  predniSONE (DELTASONE) 10 MG tablet Take 1 tablet (10 mg total) by mouth as directed. 03/29/23     prochlorperazine (COMPAZINE) 10 MG tablet Take 1 tablet (10 mg  total) by mouth every 6 (six) hours as needed for nausea or vomiting. Patient taking differently: Take 10 mg by mouth as needed for nausea or vomiting. 07/01/22   Monica Becton, MD  saccharomyces boulardii (FLORASTOR) 250 MG capsule Take 250 mg by mouth 2 (two) times daily.     [provider]  triamcinolone cream (KENALOG) 0.1 % Apply 1 Application topically 2 (two) times daily. Patient taking differently: Apply 1 Application topically as needed (flares). 10/29/22   Everrett Coombe, DO  vortioxetine HBr (TRINTELLIX) 10 MG TABS tablet Take 1 tablet (10 mg total) by mouth daily. 11/22/22   Everrett Coombe, DO      Allergies    Apple juice, Cucumber extract, Depakote er [divalproex sodium er], Depakote [valproic acid], Peanut butter flavor, Peanut oil, Peanut-containing drug products, Shellfish allergy, Shrimp extract, Strawberry extract, Apple, Cantaloupe extract allergy skin test, Codeine, Firvanq [vancomycin], and Lactose intolerance (gi)    Review of Systems   Review of Systems  Constitutional:  Negative for fever.  Eyes:  Negative for visual disturbance.  Respiratory:  Negative for shortness of breath.   Cardiovascular:  Negative for chest pain.  Gastrointestinal:  Positive for abdominal pain, constipation and vomiting (no hematemesis). Negative for blood in stool.  Neurological:  Positive for weakness, light-headedness and headaches. Negative for dizziness and numbness.    Physical Exam Updated Vital Signs BP 124/79   Pulse 79   Temp 98.5 F (36.9 C)   Resp 18   Wt 74.8 kg   SpO2 100%   BMI 29.23 kg/m  Physical Exam Vitals and nursing note reviewed.  Constitutional:      Appearance: He is well-developed.  HENT:     Head: Normocephalic and atraumatic.  Eyes:     Extraocular Movements: Extraocular movements intact.     Pupils: Pupils are equal, round, and reactive to light.  Cardiovascular:     Rate and Rhythm: Normal rate and regular rhythm.     Heart sounds:  Murmur heard.     Comments: Murmur is known per patient/wife Pulmonary:     Effort: Pulmonary effort is normal.     Breath sounds: Normal breath sounds.  Abdominal:     Palpations: Abdomen is soft.     Tenderness: There is abdominal tenderness in the left upper quadrant and left lower quadrant.  Skin:    General: Skin is warm and dry.  Neurological:     Mental Status: He is alert.     Comments: CN 3-12 grossly intact. 5/5 strength in all 4 extremities, though mildly weaker in legs. Grossly normal sensation. Normal finger to nose.      ED Results / Procedures / Treatments   Labs (all labs ordered are listed, but only abnormal results are displayed) Labs Reviewed  CBC WITH DIFFERENTIAL/PLATELET - Abnormal; Notable for the following components:      Result Value  RBC 3.26 (*)    Hemoglobin 8.3 (*)    HCT 26.4 (*)    MCH 25.5 (*)    RDW 21.1 (*)    Platelets 68 (*)    Monocytes Absolute 1.2 (*)    All other components within normal limits  COMPREHENSIVE METABOLIC PANEL - Abnormal; Notable for the following components:   Potassium 3.1 (*)    Glucose, Bld 106 (*)    Calcium 8.2 (*)    Total Protein 6.2 (*)    Albumin 2.5 (*)    AST 57 (*)    Total Bilirubin 2.3 (*)    All other components within normal limits  AMMONIA - Abnormal; Notable for the following components:   Ammonia 38 (*)    All other components within normal limits  MAGNESIUM - Abnormal; Notable for the following components:   Magnesium 1.2 (*)    All other components within normal limits  URINALYSIS, W/ REFLEX TO CULTURE (INFECTION SUSPECTED) - Abnormal; Notable for the following components:   Bacteria, UA RARE (*)    All other components within normal limits  PROTIME-INR - Abnormal; Notable for the following components:   Prothrombin Time 21.2 (*)    INR 1.8 (*)    All other components within normal limits  LIPASE, BLOOD    EKG EKG Interpretation Date/Time:  Sunday April 03 2023 15:48:27  EDT Ventricular Rate:  85 PR Interval:  166 QRS Duration:  100 QT Interval:  392 QTC Calculation: 467 R Axis:   14  Text Interpretation: Sinus rhythm Low voltage, precordial leads no significant change since earlier in the day Confirmed by Pricilla Loveless [phone]) on 04/03/2023 4:15:49 PM  Radiology CT ABDOMEN PELVIS W CONTRAST  Result Date: 04/03/2023 CLINICAL DATA:  Left lower quadrant pain and lightheadedness. Cirrhosis with 3 falls this week. EXAM: CT ABDOMEN AND PELVIS WITH CONTRAST TECHNIQUE: Multidetector CT imaging of the abdomen and pelvis was performed using the standard protocol following bolus administration of intravenous contrast. RADIATION DOSE REDUCTION: This exam was performed according to the departmental dose-optimization program which includes automated exposure control, adjustment of the mA and/or kV according to patient size and/or use of iterative reconstruction technique. CONTRAST:  OMNIPAQUE IOHEXOL 300 MG/ML  SOLN COMPARISON:  08/17/2022 CT FINDINGS: Lower chest: Coronary atherosclerosis. Calcification around the aortic valve. Mild enlargement of the lower periesophageal lymph node without acute inflammation or masslike finding seen in the vicinity. Hepatobiliary: Cirrhotic liver morphology. Recanalized umbilical vein and abdominal varices with hazy retroperitoneal fat on a chronic basis. No hepatic mass lesion on the portal venous phase.Cholelithiasis. No biliary dilatation or biliary inflammation. Pancreas: Unremarkable. Spleen: Unremarkable. Adrenals/Urinary Tract: Negative adrenals. No hydronephrosis or stone. Unremarkable bladder. Stomach/Bowel: No obstruction. No visible bowel inflammation. Some prominence of ascending colon wall thickness but only in areas where there is luminal collapse. Metallic density in the descending colon, presumably ingested. Vascular/Lymphatic: No acute vascular abnormality. Extensive intra-abdominal variceal formation specially at the level  of the mesentery, anterior abdominal wall, and left renal vein. No mass or adenopathy. Reproductive:No acute finding. Dystrophic calcifications at the prostate. Other: No ascites or pneumoperitoneum.  Inguinal hernia repair. Musculoskeletal: No acute abnormalities. IMPRESSION: 1. No acute finding. 2. Cirrhosis with varices.  No ascites. 3. Cholelithiasis. 4. Atherosclerosis including the coronary arteries. Electronically Signed   By: Tiburcio Pea M.D.   On: 04/03/2023 17:59   CT Head Wo Contrast  Result Date: 04/03/2023 CLINICAL DATA:  Mental status change with unknown cause. EXAM: CT HEAD WITHOUT  CONTRAST TECHNIQUE: Contiguous axial images were obtained from the base of the skull through the vertex without intravenous contrast. RADIATION DOSE REDUCTION: This exam was performed according to the departmental dose-optimization program which includes automated exposure control, adjustment of the mA and/or kV according to patient size and/or use of iterative reconstruction technique. COMPARISON:  10/29/2022 FINDINGS: Brain: No evidence of acute infarction, hemorrhage, hydrocephalus, extra-axial collection or mass lesion/mass effect. Brain atrophy especially affecting the cerebellum. Findings correlate with history including alcohol abuse. Vascular: No hyperdense vessel or unexpected calcification. Skull: Normal. Negative for fracture or focal lesion. Sinuses/Orbits: No acute finding. IMPRESSION: No acute or reversible finding. Premature brain atrophy. Electronically Signed   By: Tiburcio Pea M.D.   On: 04/03/2023 17:54   DG Chest Portable 1 View  Result Date: 04/03/2023 CLINICAL DATA:  Weakness EXAM: PORTABLE CHEST 1 VIEW COMPARISON:  03/18/2023 FINDINGS: The heart size and mediastinal contours are within normal limits. Slightly low lung volumes. No focal airspace consolidation, pleural effusion, or pneumothorax. The visualized skeletal structures are unremarkable. IMPRESSION: No active disease.  Electronically Signed   By: Duanne Guess D.O.   On: 04/03/2023 16:26    Procedures Procedures    Medications Ordered in ED Medications  lactated ringers bolus 1,000 mL (0 mLs Intravenous Stopped 04/03/23 1643)  iohexol (OMNIPAQUE) 300 MG/ML solution 100 mL (100 mLs Intravenous Contrast Given 04/03/23 1651)  magnesium sulfate IVPB 4 g 100 mL (0 g Intravenous Stopped 04/03/23 1948)  potassium chloride SA (KLOR-CON M) CR tablet 40 mEq (40 mEq Oral Given 04/03/23 1736)  magnesium oxide (MAG-OX) tablet 800 mg (800 mg Oral Given 04/03/23 1744)    ED Course/ Medical Decision Making/ A&P                                 Medical Decision Making Amount and/or Complexity of Data Reviewed Labs: ordered.    Details: Chronic anemia and chronic thrombocytopenia.  Mild hypokalemia but his magnesium is 1.2. Radiology: ordered and independent interpretation performed.    Details: No head bleed or SBO ECG/medicine tests: ordered and independent interpretation performed.    Details: No ischemia  Risk OTC drugs. Prescription drug management.   Patient presents with overall not feeling well.  Wife describes him as not quite acting right and maybe like he has increased ammonia levels but he is not lethargic and his ammonia is very minimally elevated.  He does have electrolyte disturbances which appear to be acute on chronic.  These were repleted in the ED.  He is tolerating p.o.  He is not altered on my exam and with a negative workup, I do not think he needs admission or further emergent treatment.  No signs of a bacterial infection.  After a bolus of fluids he is symptomatically feeling better and feels well enough for discharge.  He is already on magnesium and potassium at home and we have discussed increasing doses for the next couple days to account for the low levels here.  No QTc prolongation.  I do not think he needs to be monitored in the hospital at this time and has normal vitals.  Will discharge  home with return precautions.        Final Clinical Impression(s) / ED Diagnoses Final diagnoses:  Hypomagnesemia  Hypokalemia    Rx / DC Orders ED Discharge Orders     None         Pricilla Loveless, MD  04/03/23 2334  

## 2023-04-04 ENCOUNTER — Telehealth: Payer: Self-pay | Admitting: General Practice

## 2023-04-04 NOTE — Transitions of Care (Post Inpatient/ED Visit) (Signed)
   04/04/2023  Name: Mical Tumlinson MRN: [id number] DOB: 1966/07/13  Today's TOC FU Call Status: Today's TOC FU Call Status:: Unsuccessful Call (1st Attempt) Unsuccessful Call (1st Attempt) Date: 04/04/23  Attempted to reach the patient regarding the most recent Inpatient/ED visit.  Follow Up Plan: Additional outreach attempts will be made to reach the patient to complete the Transitions of Care (Post Inpatient/ED visit) call.   Signature [place]esto Charon, Control and instrumentation engineer

## 2023-04-05 NOTE — Transitions of Care (Post Inpatient/ED Visit) (Signed)
   04/05/2023  Name: Juan [name] MRN: [id number] DOB: [birth year]  Today's TOC FU Call Status: Today's TOC FU Call Status:: Unsuccessful Call (2nd Attempt) Unsuccessful Call (1st Attempt) Date: 04/04/23 Unsuccessful Call (2nd Attempt) Date: 04/05/23  Attempted to reach the patient regarding the most recent Inpatient/ED visit.  Follow Up Plan: Additional outreach attempts will be made to reach the patient to complete the Transitions of Care (Post Inpatient/ED visit) call.   Signature [place] Charon, Control and instrumentation engineer

## 2023-04-06 ENCOUNTER — Ambulatory Visit: Payer: Commercial Managed Care - PPO | Admitting: Sports Medicine

## 2023-04-06 NOTE — Transitions of Care (Post Inpatient/ED Visit) (Signed)
   04/06/2023  Name: Juan [name] MRN: [id number] DOB: 19-Nov-1965  Today's TOC FU Call Status: Today's TOC FU Call Status:: Unsuccessful Call (3rd Attempt) Unsuccessful Call (1st Attempt) Date: 04/04/23 Unsuccessful Call (2nd Attempt) Date: 04/05/23 Unsuccessful Call (3rd Attempt) Date: 04/06/23  Attempted to reach the patient regarding the most recent Inpatient/ED visit.  Follow Up Plan: No further outreach attempts will be made at this time. We have been unable to contact the patient.  Signature Modesto Charon, Control and instrumentation engineer

## 2023-04-11 ENCOUNTER — Ambulatory Visit (INDEPENDENT_AMBULATORY_CARE_PROVIDER_SITE_OTHER): Payer: Commercial Managed Care - PPO | Admitting: Family Medicine

## 2023-04-11 ENCOUNTER — Encounter: Payer: Self-pay | Admitting: Family Medicine

## 2023-04-11 ENCOUNTER — Ambulatory Visit (INDEPENDENT_AMBULATORY_CARE_PROVIDER_SITE_OTHER): Payer: Commercial Managed Care - PPO

## 2023-04-11 VITALS — BP 106/65 | HR 79 | Ht 64.0 in | Wt 167.8 lb

## 2023-04-11 DIAGNOSIS — M79662 Pain in left lower leg: Secondary | ICD-10-CM

## 2023-04-11 DIAGNOSIS — I8002 Phlebitis and thrombophlebitis of superficial vessels of left lower extremity: Secondary | ICD-10-CM

## 2023-04-11 DIAGNOSIS — R600 Localized edema: Secondary | ICD-10-CM | POA: Diagnosis not present

## 2023-04-11 DIAGNOSIS — I82812 Embolism and thrombosis of superficial veins of left lower extremities: Secondary | ICD-10-CM | POA: Diagnosis not present

## 2023-04-11 HISTORY — DX: Pain in left lower leg: M79.662

## 2023-04-11 NOTE — Progress Notes (Signed)
Juan [name] - 57 y.o. male MRN [id number]  Date of birth: [birth year]  Subjective Chief Complaint  Patient presents with   calf pain    Pain in Left calf.  X Saturday 04/09/23 in same location as previous  DVT- pain is worse at night . Has been using icy hot and oral tylenol.     HPI Juan [name] is a 57 y.o. male here today with concern of calf pain.  Pain located in the L calf.  Started about 2 days ago.  He does have history of DVT and reports that this feels similar to him.  He has felt A little more swollen.  Denies fever or chills.  He he does have a history of cirrhosis with thrombocytopenia.  He has had some recurrent issues with bright red blood per rectum.  He was treated with Xarelto previously when he had DVT.  ROS:  A comprehensive ROS was completed and negative except as noted per HPI  Allergies  Allergen Reactions   Apple Juice Swelling and Other (See Comments)    Tongue swelling   Cucumber Extract Itching and Nausea And Vomiting    No extracts; just cucumber   Depakote Er [Divalproex Sodium Er] Swelling and Other (See Comments)    Tongue swelling   Depakote [Valproic Acid] Anaphylaxis   Peanut Butter Flavor Anaphylaxis and Swelling   Peanut Oil Swelling   Peanut-Containing Drug Products Swelling   Shellfish Allergy Itching and Swelling   Shrimp Extract Itching and Swelling   Strawberry Extract Nausea And Vomiting and Swelling   Apple Swelling   Cantaloupe Extract Allergy Skin Test Rash   Codeine Itching, Rash and Other (See Comments)    Patient reports he can take "CODONES" without problems   Firvanq [Vancomycin] Rash   Lactose Intolerance (Gi) Diarrhea and Other (See Comments)    Indigestion, Stomach pain, Flatulence    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   Chronic fatigue    Cirrhosis (HCC)     Colon polyps    Depression    GERD (gastroesophageal reflux disease)    Hemochromatosis    Hiatal hernia 02/20/2022   Hx of blood clots    Leg   Hyperreflexia    Hypertension    PTSD (post-traumatic stress disorder)    Traumatic hemorrhagic shock [facility])     Past Surgical History:  Procedure Laterality Date   BIOPSY  09/24/2021   Procedure: BIOPSY;  Surgeon: Imogene Burn, MD;  [facility]COPY;  Service: Gastroenterology;;   Fidela Salisbury RELEASE Bilateral    COLONOSCOPY WITH PROPOFOL N/A 09/24/2021   Procedure: COLONOSCOPY WITH PROPOFOL;  Surgeon: Imogene Burn, MD;  Location: [facility] ENDOSCOPY;  Service: Gastroenterology;  Laterality: N/A;   ESOPHAGOGASTRODUODENOSCOPY (EGD) WITH PROPOFOL N/A 09/24/2021   Procedure: ESOPHAGOGASTRODUODENOSCOPY (EGD) WITH PROPOFOL;  Surgeon: Imogene Burn, MD;  Location: [facility] ENDOSCOPY;  Service: Gastroenterology;  Laterality: N/A;   ESOPHAGOGASTRODUODENOSCOPY (EGD) WITH PROPOFOL N/A 08/18/2022   Procedure: ESOPHAGOGASTRODUODENOSCOPY (EGD) WITH PROPOFOL;  Surgeon: Hilarie Fredrickson, MD;  Location: [facility] ENDOSCOPY;  Service: Gastroenterology;  Laterality: N/A;   FLEXIBLE SIGMOIDOSCOPY N/A 08/18/2022   Procedure: FLEXIBLE SIGMOIDOSCOPY;  Surgeon: Hilarie Fredrickson, MD;  Location: [facility] ENDOSCOPY;  Service: Gastroenterology;  Laterality: N/A;   FRACTURE SURGERY     left ankle plate   HERNIA REPAIR  inguinal   KNEE SURGERY Right    x 4   SHOULDER SURGERY Bilateral    x 2   VASECTOMY      Social History   Socioeconomic History   Marital status: Married    Spouse name: [name]   Number of children: 2   Years of education: Not on file   Highest education level: 12th grade  Occupational History    Comment: disability  Tobacco Use   Smoking status: Never   Smokeless tobacco: Current    Types: Snuff  Vaping Use   Vaping status: Never Used  Substance and Sexual Activity   Alcohol use: Not Currently   Drug use: Never   Sexual activity: Not on file  Other  Topics Concern   Not on file  Social History Narrative   Lives with wife   Social Determinants of Health   Financial Resource Strain: Medium Risk (01/09/2023)   Overall Financial Resource Strain (CARDIA)    Difficulty of Paying Living Expenses: Somewhat hard  Food Insecurity: No Food Insecurity (01/19/2023)   Hunger Vital Sign    Worried About Running Out of Food in the Last Year: Never true    Ran Out of Food in the Last Year: Never true  Transportation Needs: No Transportation Needs (01/19/2023)   PRAPARE - Administrator, Civil Service (Medical): No    Lack of Transportation (Non-Medical): No  Physical Activity: Insufficiently Active (01/09/2023)   Exercise Vital Sign    Days of Exercise per Week: 1 day    Minutes of Exercise per Session: 10 min  Stress: Stress Concern Present (01/09/2023)   [employer] of Occupational Health - Occupational Stress Questionnaire    Feeling of Stress : To some extent  Social Connections: Moderately Integrated (01/09/2023)   Social Connection and Isolation Panel [NHANES]    Frequency of Communication with Friends and Family: More than three times a week    Frequency of Social Gatherings with Friends and Family: Once a week    Attends Religious Services: More than 4 times per year    Active Member of [employer] or Organizations: No    Attends Engineer, structural: Not on file    Marital Status: Married    Family History  Problem Relation Age of Onset   Pulmonary fibrosis Mother    Hypertension Father    Other Father        liver failure   Diabetes Brother    Breast cancer Paternal Aunt    Colon cancer Neg Hx    Esophageal cancer Neg Hx    Stomach cancer Neg Hx    Rectal cancer Neg Hx     Health Maintenance  Topic Date Due   INFLUENZA VACCINE  03/24/2023   COVID-19 Vaccine (4 - 2023-24 season) 06/18/2023 (Originally 04/23/2022)   DTaP/Tdap/Td (3 - Td or Tdap) 06/20/2029   Colonoscopy  09/25/2031   Hepatitis C  Screening  Completed   HIV Screening  Completed   Zoster Vaccines- Shingrix  Completed   HPV VACCINES  Aged Out     ----------------------------------------------------------------------------------------------------------------------------------------------------------------------------------------------------------------- Physical Exam BP 106/65   Pulse 79   Ht 5\' 4"  (1.626 m)   Wt 167 lb 12 oz (76.1 kg)   SpO2 97%   BMI 28.79 kg/m   Physical Exam Constitutional:      Appearance: Normal appearance.  HENT:     Head: Normocephalic and atraumatic.  Musculoskeletal:     Comments: Tenderness to palpation along the  left calf.  Some tenderness to palpation of the popliteal fossa.  Thigh is nontender.  Neurological:     General: No focal deficit present.     Mental Status: He is alert.  Psychiatric:        Mood and Affect: Mood normal.        Behavior: Behavior normal.     ------------------------------------------------------------------------------------------------------------------------------------------------------------------------------------------------------------------- Assessment and Plan  Pain of left calf Stat lower extremity Doppler the left leg ordered.  This shows superficial thrombophlebitis of the distal saphenous as well as the small saphenous adjacent to the popliteal area.  Due to proximity of the popliteal area and the deep venous system I recommended that he repeat scan in 48 to 72 hours.   No orders of the defined types were placed in this encounter.   No follow-ups on file.    This visit occurred during the SARS-CoV-2 public health emergency.  Safety protocols were in place, including screening questions prior to the visit, additional usage of staff PPE, and extensive cleaning of exam room while observing appropriate contact time as indicated for disinfecting solutions.

## 2023-04-11 NOTE — Assessment & Plan Note (Signed)
Stat lower extremity Doppler the left leg ordered.  This shows superficial thrombophlebitis of the distal saphenous as well as the small saphenous adjacent to the popliteal area.  Due to proximity of the popliteal area and the deep venous system I recommended that he repeat scan in 48 to 72 hours.

## 2023-04-13 ENCOUNTER — Ambulatory Visit (INDEPENDENT_AMBULATORY_CARE_PROVIDER_SITE_OTHER): Payer: Commercial Managed Care - PPO

## 2023-04-13 DIAGNOSIS — I8002 Phlebitis and thrombophlebitis of superficial vessels of left lower extremity: Secondary | ICD-10-CM

## 2023-04-13 DIAGNOSIS — I82812 Embolism and thrombosis of superficial veins of left lower extremities: Secondary | ICD-10-CM | POA: Diagnosis not present

## 2023-04-15 ENCOUNTER — Other Ambulatory Visit (INDEPENDENT_AMBULATORY_CARE_PROVIDER_SITE_OTHER): Payer: Commercial Managed Care - PPO

## 2023-04-15 ENCOUNTER — Ambulatory Visit (INDEPENDENT_AMBULATORY_CARE_PROVIDER_SITE_OTHER): Payer: Commercial Managed Care - PPO | Admitting: Sports Medicine

## 2023-04-15 ENCOUNTER — Ambulatory Visit: Payer: Commercial Managed Care - PPO | Admitting: Sports Medicine

## 2023-04-15 ENCOUNTER — Encounter: Payer: Self-pay | Admitting: Sports Medicine

## 2023-04-15 DIAGNOSIS — M1731 Unilateral post-traumatic osteoarthritis, right knee: Secondary | ICD-10-CM

## 2023-04-15 MED ORDER — HYALURONAN 30 MG/2ML IX SOSY
30.0000 mg | PREFILLED_SYRINGE | Freq: Once | INTRA_ARTICULAR | Status: AC
Start: 2023-04-15 — End: 2023-04-15
  Administered 2023-04-15: 30 mg via INTRA_ARTICULAR

## 2023-04-15 NOTE — Assessment & Plan Note (Signed)
Orthovisc No. 3 of 4 right knee, return in 1 week for #4 of 4. 

## 2023-04-15 NOTE — Progress Notes (Signed)
    Procedures performed today:    Procedure: Real-time Ultrasound Guided injection of the right knee Device: Samsung HS60  Verbal informed consent obtained.  Time-out conducted.  Noted no overlying erythema, induration, or other signs of local infection.  Skin prepped in a sterile fashion.  Local anesthesia: Topical Ethyl chloride.  With sterile technique and under real time ultrasound guidance: Trace effusion noted, 30 mg/2 mL of OrthoVisc (sodium hyaluronate) in a prefilled syringe was injected easily into the knee through a 22-gauge needle. Completed without difficulty  Advised to call if fevers/chills, erythema, induration, drainage, or persistent bleeding.  Images permanently stored and available for review in PACS.  Impression: Technically successful ultrasound guided injection.  Independent interpretation of notes and tests performed by another provider:   None.  Brief History, Exam, Impression, and Recommendations:    Post-traumatic osteoarthritis of right knee Orthovisc No. 3 of 4 right knee, return in 1 week for #4 of 4.    ____________________________________________ Ihor Austin. Benjamin Stain, M.D., ABFM., CAQSM., AME. Primary Care and Sports Medicine [facility]  Adjunct Professor of Family Medicine  [employer] [facility] [employer]  Restaurant manager, fast food

## 2023-04-15 NOTE — Addendum Note (Signed)
Addended by: Latanya Presser on: 04/15/2023 12:01 PM   Modules accepted: Orders

## 2023-04-18 DIAGNOSIS — M1731 Unilateral post-traumatic osteoarthritis, right knee: Secondary | ICD-10-CM | POA: Diagnosis not present

## 2023-04-18 MED ORDER — HYALURONAN 30 MG/2ML IX SOSY
30.0000 mg | PREFILLED_SYRINGE | Freq: Once | INTRA_ARTICULAR | Status: AC
Start: 2023-04-18 — End: 2023-04-18
  Administered 2023-04-18: 30 mg via INTRA_ARTICULAR

## 2023-04-18 NOTE — Addendum Note (Signed)
Addended by: Holland Falling on: 04/18/2023 08:19 AM   Modules accepted: Orders

## 2023-04-19 ENCOUNTER — Encounter: Payer: No Typology Code available for payment source | Admitting: Family Medicine

## 2023-04-21 ENCOUNTER — Ambulatory Visit: Payer: Commercial Managed Care - PPO | Admitting: Sports Medicine

## 2023-04-22 ENCOUNTER — Ambulatory Visit (INDEPENDENT_AMBULATORY_CARE_PROVIDER_SITE_OTHER): Payer: Commercial Managed Care - PPO | Admitting: Sports Medicine

## 2023-04-22 ENCOUNTER — Other Ambulatory Visit (INDEPENDENT_AMBULATORY_CARE_PROVIDER_SITE_OTHER): Payer: Commercial Managed Care - PPO

## 2023-04-22 DIAGNOSIS — M1731 Unilateral post-traumatic osteoarthritis, right knee: Secondary | ICD-10-CM | POA: Diagnosis not present

## 2023-04-22 MED ORDER — HYALURONAN 30 MG/2ML IX SOSY
30.0000 mg | PREFILLED_SYRINGE | Freq: Once | INTRA_ARTICULAR | Status: AC
Start: 2023-04-22 — End: 2023-04-22
  Administered 2023-04-22: 30 mg via INTRA_ARTICULAR

## 2023-04-22 NOTE — Addendum Note (Signed)
Addended by: Carren Rang A on: 04/22/2023 01:56 PM   Modules accepted: Orders

## 2023-04-22 NOTE — Assessment & Plan Note (Signed)
Orthovisc 4 of 4 right knee, return to see me in 6 weeks as needed

## 2023-04-22 NOTE — Progress Notes (Signed)
    Procedures performed today:    Procedure: Real-time Ultrasound Guided injection of the right knee Device: Samsung HS60  Verbal informed consent obtained.  Time-out conducted.  Noted no overlying erythema, induration, or other signs of local infection.  Skin prepped in a sterile fashion.  Local anesthesia: Topical Ethyl chloride.  With sterile technique and under real time ultrasound guidance: Trace effusion noted, 30 mg/2 mL of OrthoVisc (sodium hyaluronate) in a prefilled syringe was injected easily into the knee through a 22-gauge needle. Completed without difficulty  Advised to call if fevers/chills, erythema, induration, drainage, or persistent bleeding.  Images permanently stored and available for review in PACS.  Impression: Technically successful ultrasound guided injection.  Independent interpretation of notes and tests performed by another provider:   None.  Brief History, Exam, Impression, and Recommendations:    Post-traumatic osteoarthritis of right knee Orthovisc 4 of 4 right knee, return to see me in 6 weeks as needed    ____________________________________________ Ihor Austin. Benjamin Stain, M.D., ABFM., CAQSM., AME. Primary Care and Sports Medicine [facility]  Adjunct Professor of Family Medicine  [employer] [facility] [employer]  Restaurant manager, fast food

## 2023-04-26 DIAGNOSIS — M1731 Unilateral post-traumatic osteoarthritis, right knee: Secondary | ICD-10-CM | POA: Diagnosis not present

## 2023-04-26 MED ORDER — HYALURONAN 30 MG/2ML IX SOSY
30.0000 mg | PREFILLED_SYRINGE | Freq: Once | INTRA_ARTICULAR | Status: AC
Start: 2023-04-26 — End: 2023-04-26
  Administered 2023-04-26: 30 mg via INTRA_ARTICULAR

## 2023-04-26 NOTE — Addendum Note (Signed)
Addended by: Carren Rang A on: 04/26/2023 10:21 AM   Modules accepted: Orders

## 2023-04-29 ENCOUNTER — Ambulatory Visit: Payer: Commercial Managed Care - PPO | Admitting: Gastroenterology

## 2023-05-05 ENCOUNTER — Telehealth: Payer: Self-pay | Admitting: Sports Medicine

## 2023-05-05 NOTE — Telephone Encounter (Signed)
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  The embolization is far less invasive and would be my recommendation for the next step for him.  That would be done with interventional radiology.

## 2023-05-05 NOTE — Telephone Encounter (Signed)
Left VM for patient to call back

## 2023-05-05 NOTE — Telephone Encounter (Signed)
Patient called in stating that the gel shots "aren't worth a crap" and wants to know what else can be done. Requesting a call back.

## 2023-05-10 ENCOUNTER — Ambulatory Visit: Payer: Commercial Managed Care - PPO | Admitting: Sports Medicine

## 2023-05-12 ENCOUNTER — Encounter: Payer: Self-pay | Admitting: Family Medicine

## 2023-05-12 ENCOUNTER — Other Ambulatory Visit (HOSPITAL_BASED_OUTPATIENT_CLINIC_OR_DEPARTMENT_OTHER): Payer: Self-pay

## 2023-05-12 ENCOUNTER — Ambulatory Visit (INDEPENDENT_AMBULATORY_CARE_PROVIDER_SITE_OTHER): Payer: No Typology Code available for payment source | Admitting: Family Medicine

## 2023-05-12 VITALS — BP 104/59 | HR 83 | Ht 64.0 in | Wt 144.0 lb

## 2023-05-12 DIAGNOSIS — Z125 Encounter for screening for malignant neoplasm of prostate: Secondary | ICD-10-CM

## 2023-05-12 DIAGNOSIS — K7030 Alcoholic cirrhosis of liver without ascites: Secondary | ICD-10-CM | POA: Diagnosis not present

## 2023-05-12 DIAGNOSIS — H10421 Simple chronic conjunctivitis, right eye: Secondary | ICD-10-CM | POA: Diagnosis not present

## 2023-05-12 DIAGNOSIS — Z0000 Encounter for general adult medical examination without abnormal findings: Secondary | ICD-10-CM | POA: Diagnosis not present

## 2023-05-12 DIAGNOSIS — S0502XA Injury of conjunctiva and corneal abrasion without foreign body, left eye, initial encounter: Secondary | ICD-10-CM | POA: Diagnosis not present

## 2023-05-12 DIAGNOSIS — Z13220 Encounter for screening for lipoid disorders: Secondary | ICD-10-CM | POA: Diagnosis not present

## 2023-05-12 DIAGNOSIS — I10 Essential (primary) hypertension: Secondary | ICD-10-CM

## 2023-05-12 DIAGNOSIS — R5382 Chronic fatigue, unspecified: Secondary | ICD-10-CM | POA: Diagnosis not present

## 2023-05-12 MED ORDER — ERYTHROMYCIN 5 MG/GM OP OINT
1.0000 | TOPICAL_OINTMENT | Freq: Two times a day (BID) | OPHTHALMIC | 0 refills | Status: DC
  Filled 2023-05-12: qty 3.5, 2d supply, fill #0
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
## 2023-05-12 NOTE — Patient Instructions (Signed)
Preventive Care 40-55 Years Old, Male Preventive care refers to lifestyle choices and visits with your health care provider that can promote health and wellness. Preventive care visits are also called wellness exams. What can I expect for my preventive care visit? Counseling During your preventive care visit, your health care provider may ask about your: Medical history, including: Past medical problems. Family medical history. Current health, including: Emotional well-being. Home life and relationship well-being. Sexual activity. Lifestyle, including: Alcohol, nicotine or tobacco, and drug use. Access to firearms. Diet, exercise, and sleep habits. Safety issues such as seatbelt and bike helmet use. Sunscreen use. Work and work environment. Physical exam Your health care provider will check your: Height and weight. These may be used to calculate your BMI (body mass index). BMI is a measurement that tells if you are at a healthy weight. Waist circumference. This measures the distance around your waistline. This measurement also tells if you are at a healthy weight and may help predict your risk of certain diseases, such as type 2 diabetes and high blood pressure. Heart rate and blood pressure. Body temperature. Skin for abnormal spots. What immunizations do I need?  Vaccines are usually given at various ages, according to a schedule. Your health care provider will recommend vaccines for you based on your age, medical history, and lifestyle or other factors, such as travel or where you work. What tests do I need? Screening Your health care provider may recommend screening tests for certain conditions. This may include: Lipid and cholesterol levels. Diabetes screening. This is done by checking your blood sugar (glucose) after you have not eaten for a while (fasting). Hepatitis B test. Hepatitis C test. HIV (human immunodeficiency virus) test. STI (sexually transmitted infection)  testing, if you are at risk. Lung cancer screening. Prostate cancer screening. Colorectal cancer screening. Talk with your health care provider about your test results, treatment options, and if necessary, the need for more tests. Follow these instructions at home: Eating and drinking  Eat a diet that includes fresh fruits and vegetables, whole grains, lean protein, and low-fat dairy products. Take vitamin and mineral supplements as recommended by your health care provider. Do not drink alcohol if your health care provider tells you not to drink. If you drink alcohol: Limit how much you have to 0-2 drinks a day. Know how much alcohol is in your drink. In the [place] one drink equals one 12 oz bottle of beer (355 mL), one 5 oz glass of wine (148 mL), or one 1 oz glass of hard liquor (44 mL). Lifestyle Brush your teeth every morning and night with fluoride toothpaste. Floss one time each day. Exercise for at least 30 minutes 5 or more days each week. Do not use any products that contain nicotine or tobacco. These products include cigarettes, chewing tobacco, and vaping devices, such as e-cigarettes. If you need help quitting, ask your health care provider. Do not use drugs. If you are sexually active, practice safe sex. Use a condom or other form of protection to prevent STIs. Take aspirin only as told by your health care provider. Make sure that you understand how much to take and what form to take. Work with your health care provider to find out whether it is safe and beneficial for you to take aspirin daily. Find healthy ways to manage stress, such as: Meditation, yoga, or listening to music. Journaling. Talking to a trusted person. Spending time with friends and family. Minimize exposure to UV radiation to reduce   your risk of skin cancer. Safety Always wear your seat belt while driving or riding in a vehicle. Do not drive: If you have been drinking alcohol. Do not ride with someone who  has been drinking. When you are tired or distracted. While texting. If you have been using any mind-altering substances or drugs. Wear a helmet and other protective equipment during sports activities. If you have firearms in your house, make sure you follow all gun safety procedures. What's next? Go to your health care provider once a year for an annual wellness visit. Ask your health care provider how often you should have your eyes and teeth checked. Stay up to date on all vaccines. This information is not intended to replace advice given to you by your health care provider. Make sure you discuss any questions you have with your health care provider. Document Revised: 02/04/2021 Document Reviewed: 02/04/2021 Elsevier Patient Education  2024 Elsevier Inc.  

## 2023-05-12 NOTE — Assessment & Plan Note (Signed)
Well adult Orders Placed This Encounter  Procedures   CMP14+EGFR   CBC with Differential/Platelet   Lipid Panel With LDL/HDL Ratio   PSA   Vitamin D (25 hydroxy)   B12   Vitamin B1  Screenings: Per lab orders Immunizations: Flu vaccine recommended, declines Anticipatory guidance/risk reduction: Recommendations per AVS.

## 2023-05-13 ENCOUNTER — Other Ambulatory Visit (HOSPITAL_BASED_OUTPATIENT_CLINIC_OR_DEPARTMENT_OTHER): Payer: Self-pay

## 2023-05-13 ENCOUNTER — Other Ambulatory Visit: Payer: Self-pay | Admitting: Family Medicine

## 2023-05-13 LAB — CBC WITH DIFFERENTIAL/PLATELET

## 2023-05-13 MED ORDER — POTASSIUM CHLORIDE CRYS ER 20 MEQ PO TBCR
20.0000 meq | EXTENDED_RELEASE_TABLET | Freq: Two times a day (BID) | ORAL | 1 refills | Status: DC
  Filled 2023-05-13: qty 180, 90d supply, fill #0

## 2023-05-13 MED ORDER — MAGNESIUM OXIDE 400 MG PO TABS
400.0000 mg | ORAL_TABLET | Freq: Two times a day (BID) | ORAL | 2 refills | Status: DC
  Filled 2023-05-13: qty 120, 60d supply, fill #0

## 2023-05-17 ENCOUNTER — Encounter: Payer: Self-pay | Admitting: Family Medicine

## 2023-05-17 LAB — CMP14+EGFR
ALT: 25 IU/L (ref 0–44)
AST: 59 IU/L — ABNORMAL HIGH (ref 0–40)
Albumin: 3.4 g/dL — ABNORMAL LOW (ref 3.8–4.9)
Alkaline Phosphatase: 103 IU/L (ref 44–121)
BUN/Creatinine Ratio: 11 (ref 9–20)
BUN: 9 mg/dL (ref 6–24)
Bilirubin Total: 3.7 mg/dL — ABNORMAL HIGH (ref 0.0–1.2)
CO2: 19 mmol/L — ABNORMAL LOW (ref 20–29)
Calcium: 9.7 mg/dL (ref 8.7–10.2)
Chloride: 99 mmol/L (ref 96–106)
Creatinine, Ser: 0.81 mg/dL (ref 0.76–1.27)
Globulin, Total: 3 g/dL (ref 1.5–4.5)
Glucose: 100 mg/dL — ABNORMAL HIGH (ref 70–99)
Potassium: 3.7 mmol/L (ref 3.5–5.2)
Sodium: 134 mmol/L (ref 134–144)
Total Protein: 6.4 g/dL (ref 6.0–8.5)
eGFR: 104 mL/min/{1.73_m2} (ref >59)

## 2023-05-17 LAB — PSA: Prostate Specific Ag, Serum: 0.5 ng/mL (ref 0.0–4.0)

## 2023-05-17 LAB — CBC WITH DIFFERENTIAL/PLATELET
Basos: 1 %
EOS (ABSOLUTE): 0 10*3/uL (ref 0.0–0.2)
Eos: 2 %
Hematocrit: 27.9 % — ABNORMAL LOW (ref 37.5–51.0)
Hemoglobin: 8.8 g/dL — ABNORMAL LOW (ref 13.0–17.7)
Immature Granulocytes: 0 %
Immature Granulocytes: 0 10*3/uL (ref 0.0–0.1)
Lymphs: 20 %
MCH: 24.8 pg — ABNORMAL LOW (ref 26.6–33.0)
MCHC: 31.5 g/dL (ref 31.5–35.7)
MCV: 79 fL (ref 79–97)
Monocytes Absolute: 0.1 10*3/uL (ref 0.0–0.4)
Monocytes Absolute: 0.9 10*3/uL (ref 0.1–0.9)
Monocytes: 16 %
Neutrophils Absolute: 1.1 10*3/uL (ref 0.7–3.1)
Neutrophils Absolute: 3.6 10*3/uL (ref 1.4–7.0)
Neutrophils: 61 %
Platelets: 45 10*3/uL — CL (ref 150–450)
RBC: 3.55 x10E6/uL — ABNORMAL LOW (ref 4.14–5.80)
RDW: 21.2 % — ABNORMAL HIGH (ref 11.6–15.4)
WBC: 5.7 10*3/uL (ref 3.4–10.8)

## 2023-05-17 LAB — LIPID PANEL WITH LDL/HDL RATIO
Cholesterol, Total: 130 mg/dL (ref 100–199)
HDL: 29 mg/dL — ABNORMAL LOW (ref >39)
LDL Chol Calc (NIH): 84 mg/dL (ref 0–99)
LDL/HDL Ratio: 2.9 ratio (ref 0.0–3.6)
Triglycerides: 84 mg/dL (ref 0–149)
VLDL Cholesterol Cal: 17 mg/dL (ref 5–40)

## 2023-05-17 LAB — VITAMIN B1: Thiamine: 76.3 nmol/L (ref 66.5–200.0)

## 2023-05-17 LAB — VITAMIN B12: Vitamin B-12: >2000 pg/mL — ABNORMAL HIGH (ref 232–1245)

## 2023-05-17 LAB — VITAMIN D 25 HYDROXY (VIT D DEFICIENCY, FRACTURES): Vit D, 25-Hydroxy: 18.7 ng/mL — ABNORMAL LOW (ref 30.0–100.0)

## 2023-05-18 ENCOUNTER — Other Ambulatory Visit: Payer: Self-pay

## 2023-05-18 ENCOUNTER — Other Ambulatory Visit (HOSPITAL_BASED_OUTPATIENT_CLINIC_OR_DEPARTMENT_OTHER): Payer: Self-pay

## 2023-05-18 ENCOUNTER — Other Ambulatory Visit: Payer: Self-pay | Admitting: Family Medicine

## 2023-05-18 MED ORDER — TRAZODONE HCL 50 MG PO TABS
50.0000 mg | ORAL_TABLET | Freq: Every evening | ORAL | 3 refills | Status: DC | PRN
Start: 2023-05-18 — End: 2023-06-02
  Filled 2023-05-18: qty 90, 45d supply, fill #0

## 2023-05-20 ENCOUNTER — Other Ambulatory Visit (HOSPITAL_BASED_OUTPATIENT_CLINIC_OR_DEPARTMENT_OTHER): Payer: Self-pay

## 2023-05-20 MED ORDER — DIAZEPAM 10 MG PO TABS
10.0000 mg | ORAL_TABLET | ORAL | 0 refills | Status: DC
  Filled 2023-05-20: qty 3, 3d supply, fill #0
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
## 2023-05-22 ENCOUNTER — Emergency Department (HOSPITAL_COMMUNITY)
Admission: EM | Admit: 2023-05-22 | Discharge: 2023-05-22 | Disposition: A | Discharge status: Home / Self Care | Payer: No Typology Code available for payment source | Attending: Emergency Medicine | Admitting: Emergency Medicine

## 2023-05-22 ENCOUNTER — Encounter (HOSPITAL_COMMUNITY): Payer: Self-pay | Admitting: Nurse Practitioner

## 2023-05-22 ENCOUNTER — Inpatient Hospital Stay (HOSPITAL_COMMUNITY)
Admission: AD | Admit: 2023-05-22 | Discharge: 2023-05-27 | DRG: 885 | Disposition: A | Discharge status: Home / Self Care | Payer: No Typology Code available for payment source | Source: Intra-hospital | Attending: Nurse Practitioner | Admitting: Nurse Practitioner

## 2023-05-22 ENCOUNTER — Other Ambulatory Visit: Payer: Self-pay

## 2023-05-22 ENCOUNTER — Encounter (HOSPITAL_COMMUNITY): Payer: Self-pay

## 2023-05-22 DIAGNOSIS — Z91199 Patient's noncompliance with other medical treatment and regimen due to unspecified reason: Secondary | ICD-10-CM

## 2023-05-22 DIAGNOSIS — F4310 Post-traumatic stress disorder, unspecified: Secondary | ICD-10-CM | POA: Diagnosis present

## 2023-05-22 DIAGNOSIS — Z860100 Personal history of colon polyps, unspecified: Secondary | ICD-10-CM | POA: Diagnosis not present

## 2023-05-22 DIAGNOSIS — E876 Hypokalemia: Secondary | ICD-10-CM | POA: Diagnosis present

## 2023-05-22 DIAGNOSIS — F10231 Alcohol dependence with withdrawal delirium: Secondary | ICD-10-CM | POA: Diagnosis present

## 2023-05-22 DIAGNOSIS — K219 Gastro-esophageal reflux disease without esophagitis: Secondary | ICD-10-CM | POA: Diagnosis present

## 2023-05-22 DIAGNOSIS — K7460 Unspecified cirrhosis of liver: Secondary | ICD-10-CM | POA: Diagnosis present

## 2023-05-22 DIAGNOSIS — R32 Unspecified urinary incontinence: Secondary | ICD-10-CM | POA: Diagnosis present

## 2023-05-22 DIAGNOSIS — Z833 Family history of diabetes mellitus: Secondary | ICD-10-CM | POA: Diagnosis not present

## 2023-05-22 DIAGNOSIS — Y908 Blood alcohol level of 240 mg/100 ml or more: Secondary | ICD-10-CM | POA: Diagnosis present

## 2023-05-22 DIAGNOSIS — Z5986 Financial insecurity: Secondary | ICD-10-CM | POA: Diagnosis not present

## 2023-05-22 DIAGNOSIS — Z79899 Other long term (current) drug therapy: Secondary | ICD-10-CM

## 2023-05-22 DIAGNOSIS — E739 Lactose intolerance, unspecified: Secondary | ICD-10-CM | POA: Diagnosis present

## 2023-05-22 DIAGNOSIS — Z720 Tobacco use: Secondary | ICD-10-CM | POA: Diagnosis present

## 2023-05-22 DIAGNOSIS — K648 Other hemorrhoids: Secondary | ICD-10-CM | POA: Diagnosis present

## 2023-05-22 DIAGNOSIS — F411 Generalized anxiety disorder: Secondary | ICD-10-CM | POA: Diagnosis present

## 2023-05-22 DIAGNOSIS — R41843 Psychomotor deficit: Secondary | ICD-10-CM | POA: Diagnosis present

## 2023-05-22 DIAGNOSIS — G4700 Insomnia, unspecified: Secondary | ICD-10-CM | POA: Diagnosis present

## 2023-05-22 DIAGNOSIS — G721 Alcoholic myopathy: Secondary | ICD-10-CM | POA: Diagnosis present

## 2023-05-22 DIAGNOSIS — I10 Essential (primary) hypertension: Secondary | ICD-10-CM | POA: Insufficient documentation

## 2023-05-22 DIAGNOSIS — F29 Unspecified psychosis not due to a substance or known physiological condition: Secondary | ICD-10-CM | POA: Diagnosis not present

## 2023-05-22 DIAGNOSIS — R2689 Other abnormalities of gait and mobility: Secondary | ICD-10-CM

## 2023-05-22 DIAGNOSIS — F17200 Nicotine dependence, unspecified, uncomplicated: Secondary | ICD-10-CM | POA: Diagnosis present

## 2023-05-22 DIAGNOSIS — E860 Dehydration: Principal | ICD-10-CM | POA: Diagnosis present

## 2023-05-22 DIAGNOSIS — Z91148 Patient's other noncompliance with medication regimen for other reason: Secondary | ICD-10-CM | POA: Diagnosis not present

## 2023-05-22 DIAGNOSIS — Z888 Allergy status to other drugs, medicaments and biological substances status: Secondary | ICD-10-CM

## 2023-05-22 DIAGNOSIS — F1090 Alcohol use, unspecified, uncomplicated: Secondary | ICD-10-CM | POA: Diagnosis present

## 2023-05-22 DIAGNOSIS — R45851 Suicidal ideations: Secondary | ICD-10-CM | POA: Diagnosis present

## 2023-05-22 DIAGNOSIS — F332 Major depressive disorder, recurrent severe without psychotic features: Secondary | ICD-10-CM | POA: Diagnosis present

## 2023-05-22 DIAGNOSIS — Z8249 Family history of ischemic heart disease and other diseases of the circulatory system: Secondary | ICD-10-CM

## 2023-05-22 DIAGNOSIS — F3181 Bipolar II disorder: Secondary | ICD-10-CM | POA: Diagnosis not present

## 2023-05-22 DIAGNOSIS — Z803 Family history of malignant neoplasm of breast: Secondary | ICD-10-CM | POA: Diagnosis not present

## 2023-05-22 DIAGNOSIS — R5382 Chronic fatigue, unspecified: Secondary | ICD-10-CM | POA: Diagnosis present

## 2023-05-22 DIAGNOSIS — Z9181 History of falling: Secondary | ICD-10-CM

## 2023-05-22 DIAGNOSIS — Z91013 Allergy to seafood: Secondary | ICD-10-CM

## 2023-05-22 DIAGNOSIS — Z91010 Allergy to peanuts: Secondary | ICD-10-CM

## 2023-05-22 DIAGNOSIS — Z818 Family history of other mental and behavioral disorders: Secondary | ICD-10-CM | POA: Diagnosis not present

## 2023-05-22 DIAGNOSIS — M1990 Unspecified osteoarthritis, unspecified site: Secondary | ICD-10-CM | POA: Diagnosis present

## 2023-05-22 DIAGNOSIS — R269 Unspecified abnormalities of gait and mobility: Secondary | ICD-10-CM

## 2023-05-22 DIAGNOSIS — Z885 Allergy status to narcotic agent status: Secondary | ICD-10-CM

## 2023-05-22 DIAGNOSIS — Z1152 Encounter for screening for COVID-19: Secondary | ICD-10-CM | POA: Diagnosis not present

## 2023-05-22 DIAGNOSIS — Z20822 Contact with and (suspected) exposure to covid-19: Secondary | ICD-10-CM | POA: Insufficient documentation

## 2023-05-22 DIAGNOSIS — F419 Anxiety disorder, unspecified: Secondary | ICD-10-CM | POA: Diagnosis present

## 2023-05-22 DIAGNOSIS — F10920 Alcohol use, unspecified with intoxication, uncomplicated: Secondary | ICD-10-CM | POA: Insufficient documentation

## 2023-05-22 DIAGNOSIS — R2681 Unsteadiness on feet: Secondary | ICD-10-CM | POA: Diagnosis present

## 2023-05-22 DIAGNOSIS — G629 Polyneuropathy, unspecified: Secondary | ICD-10-CM | POA: Diagnosis present

## 2023-05-22 DIAGNOSIS — Z86718 Personal history of other venous thrombosis and embolism: Secondary | ICD-10-CM

## 2023-05-22 LAB — CBC WITH DIFFERENTIAL/PLATELET
Abs Immature Granulocytes: 0 10*3/uL (ref 0.00–0.07)
Basophils Absolute: 0.1 10*3/uL (ref 0.0–0.1)
Basophils Relative: 2 %
Eosinophils Absolute: 0.2 10*3/uL (ref 0.0–0.5)
Eosinophils Relative: 6 %
HCT: 27.2 % — ABNORMAL LOW (ref 39.0–52.0)
Hemoglobin: 8.6 g/dL — ABNORMAL LOW (ref 13.0–17.0)
Immature Granulocytes: 0 %
Lymphocytes Relative: 41 %
Lymphs Abs: 1.8 10*3/uL (ref 0.7–4.0)
MCH: 25.1 pg — ABNORMAL LOW (ref 26.0–34.0)
MCHC: 31.6 g/dL (ref 30.0–36.0)
MCV: 79.3 fL — ABNORMAL LOW (ref 80.0–100.0)
Monocytes Absolute: 0.7 10*3/uL (ref 0.1–1.0)
Monocytes Relative: 16 %
Neutro Abs: 1.5 10*3/uL — ABNORMAL LOW (ref 1.7–7.7)
Neutrophils Relative %: 35 %
Platelets: 66 10*3/uL — ABNORMAL LOW (ref 150–400)
RBC: 3.43 MIL/uL — ABNORMAL LOW (ref 4.22–5.81)
RDW: 23.3 % — ABNORMAL HIGH (ref 11.5–15.5)
WBC: 4.2 10*3/uL (ref 4.0–10.5)
nRBC: 0 % (ref 0.0–0.2)

## 2023-05-22 LAB — RAPID URINE DRUG SCREEN, HOSP PERFORMED
Amphetamines: NOT DETECTED
Barbiturates: NOT DETECTED
Benzodiazepines: POSITIVE — AB
Cocaine: NOT DETECTED
Opiates: NOT DETECTED
Tetrahydrocannabinol: NOT DETECTED

## 2023-05-22 LAB — ACETAMINOPHEN LEVEL: Acetaminophen (Tylenol), Serum: <10 ug/mL — ABNORMAL LOW (ref 10–30)

## 2023-05-22 LAB — COMPREHENSIVE METABOLIC PANEL
ALT: 35 U/L (ref 0–44)
AST: 98 U/L — ABNORMAL HIGH (ref 15–41)
Albumin: 2.9 g/dL — ABNORMAL LOW (ref 3.5–5.0)
Alkaline Phosphatase: 90 U/L (ref 38–126)
Anion gap: 8 (ref 5–15)
BUN: 9 mg/dL (ref 6–20)
CO2: 25 mmol/L (ref 22–32)
Calcium: 8.3 mg/dL — ABNORMAL LOW (ref 8.9–10.3)
Chloride: 104 mmol/L (ref 98–111)
Creatinine, Ser: 0.71 mg/dL (ref 0.61–1.24)
GFR, Estimated: >60 mL/min (ref >60)
Glucose, Bld: 141 mg/dL — ABNORMAL HIGH (ref 70–99)
Potassium: 3.4 mmol/L — ABNORMAL LOW (ref 3.5–5.1)
Sodium: 137 mmol/L (ref 135–145)
Total Bilirubin: 2 mg/dL — ABNORMAL HIGH (ref 0.3–1.2)
Total Protein: 6.7 g/dL (ref 6.5–8.1)

## 2023-05-22 LAB — RESP PANEL BY RT-PCR (RSV, FLU A&B, COVID)  RVPGX2
Influenza A by PCR: NEGATIVE
Influenza B by PCR: NEGATIVE
Resp Syncytial Virus by PCR: NEGATIVE
SARS Coronavirus 2 by RT PCR: NEGATIVE

## 2023-05-22 LAB — ETHANOL: Alcohol, Ethyl (B): 333 mg/dL (ref <10)

## 2023-05-22 LAB — SALICYLATE LEVEL: Salicylate Lvl: <7 mg/dL — ABNORMAL LOW (ref 7.0–30.0)

## 2023-05-22 MED ORDER — PANTOPRAZOLE SODIUM 40 MG PO TBEC
40.0000 mg | DELAYED_RELEASE_TABLET | Freq: Every day | ORAL | Status: DC
  Administered 2023-05-23 – 2023-05-27 (×5): 40 mg via ORAL
  Filled 2023-05-22 (×7): qty 1

## 2023-05-22 MED ORDER — DIPHENHYDRAMINE HCL 50 MG/ML IJ SOLN: 50.0000 mg | Freq: Three times a day (TID) | INTRAMUSCULAR | Status: DC | PRN

## 2023-05-22 MED ORDER — ALUM & MAG HYDROXIDE-SIMETH 200-200-20 MG/5ML PO SUSP: 30.0000 mL | ORAL | Status: DC | PRN

## 2023-05-22 MED ORDER — TRAZODONE HCL 100 MG PO TABS: 50.0000 mg | ORAL_TABLET | Freq: Every evening | ORAL | Status: DC | PRN

## 2023-05-22 MED ORDER — HYDROXYZINE HCL 25 MG PO TABS
25.0000 mg | ORAL_TABLET | Freq: Four times a day (QID) | ORAL | Status: DC | PRN
  Administered 2023-05-25 – 2023-05-27 (×2): 25 mg via ORAL
  Filled 2023-05-22 (×2): qty 1

## 2023-05-22 MED ORDER — LORAZEPAM 1 MG PO TABS
2.0000 mg | ORAL_TABLET | Freq: Three times a day (TID) | ORAL | Status: DC | PRN
  Administered 2023-05-27: 2 mg via ORAL
  Filled 2023-05-22: qty 2

## 2023-05-22 MED ORDER — ACETAMINOPHEN 325 MG PO TABS
650.0000 mg | ORAL_TABLET | Freq: Four times a day (QID) | ORAL | Status: DC | PRN
  Administered 2023-05-25: 650 mg via ORAL
  Filled 2023-05-22: qty 2

## 2023-05-22 MED ORDER — VORTIOXETINE HBR 10 MG PO TABS
10.0000 mg | ORAL_TABLET | Freq: Every day | ORAL | Status: DC
  Filled 2023-05-22 (×2): qty 1

## 2023-05-22 MED ORDER — HYDROXYZINE HCL 25 MG PO TABS
12.5000 mg | ORAL_TABLET | Freq: Three times a day (TID) | ORAL | Status: DC | PRN
  Administered 2023-05-22: 25 mg via ORAL
  Filled 2023-05-22: qty 1

## 2023-05-22 MED ORDER — LORAZEPAM 1 MG PO TABS
1.0000 mg | ORAL_TABLET | Freq: Three times a day (TID) | ORAL | Status: AC
  Administered 2023-05-24 (×3): 1 mg via ORAL
  Filled 2023-05-22 (×3): qty 1

## 2023-05-22 MED ORDER — THIAMINE HCL 100 MG/ML IJ SOLN: 100.0000 mg | Freq: Every day | INTRAMUSCULAR | Status: DC

## 2023-05-22 MED ORDER — HYDROXYZINE HCL 25 MG PO TABS: 12.5000 mg | ORAL_TABLET | Freq: Three times a day (TID) | ORAL | Status: DC | PRN

## 2023-05-22 MED ORDER — LORAZEPAM 1 MG PO TABS
1.0000 mg | ORAL_TABLET | Freq: Four times a day (QID) | ORAL | Status: AC
  Administered 2023-05-22 – 2023-05-23 (×6): 1 mg via ORAL
  Filled 2023-05-22 (×6): qty 1

## 2023-05-22 MED ORDER — VORTIOXETINE HBR 10 MG PO TABS
10.0000 mg | ORAL_TABLET | Freq: Every day | ORAL | Status: DC
  Administered 2023-05-23: 10 mg via ORAL
  Filled 2023-05-22 (×3): qty 1

## 2023-05-22 MED ORDER — PANTOPRAZOLE SODIUM 40 MG PO TBEC
40.0000 mg | DELAYED_RELEASE_TABLET | Freq: Every day | ORAL | Status: DC
  Administered 2023-05-22: 40 mg via ORAL
  Filled 2023-05-22: qty 1

## 2023-05-22 MED ORDER — LORAZEPAM 1 MG PO TABS: 0.0000 mg | ORAL_TABLET | Freq: Four times a day (QID) | ORAL | Status: DC

## 2023-05-22 MED ORDER — FOLIC ACID 1 MG PO TABS
1.0000 mg | ORAL_TABLET | Freq: Every day | ORAL | Status: DC
  Administered 2023-05-22 – 2023-05-27 (×6): 1 mg via ORAL
  Filled 2023-05-22 (×8): qty 1

## 2023-05-22 MED ORDER — LACTULOSE 10 GM/15ML PO SOLN: 10.0000 g | Freq: Every day | ORAL | Status: DC | PRN

## 2023-05-22 MED ORDER — THIAMINE MONONITRATE 100 MG PO TABS
100.0000 mg | ORAL_TABLET | Freq: Every day | ORAL | Status: DC
  Administered 2023-05-22 – 2023-05-27 (×6): 100 mg via ORAL
  Filled 2023-05-22 (×8): qty 1

## 2023-05-22 MED ORDER — LORAZEPAM 1 MG PO TABS
1.0000 mg | ORAL_TABLET | Freq: Every day | ORAL | Status: AC
  Administered 2023-05-26: 1 mg via ORAL
  Filled 2023-05-22: qty 1

## 2023-05-22 MED ORDER — TRAZODONE HCL 50 MG PO TABS: 50.0000 mg | ORAL_TABLET | Freq: Every evening | ORAL | Status: DC | PRN

## 2023-05-22 MED ORDER — DIPHENHYDRAMINE HCL 25 MG PO CAPS: 50.0000 mg | ORAL_CAPSULE | Freq: Three times a day (TID) | ORAL | Status: DC | PRN

## 2023-05-22 MED ORDER — LORAZEPAM 1 MG PO TABS: 0.0000 mg | ORAL_TABLET | Freq: Two times a day (BID) | ORAL | Status: DC

## 2023-05-22 MED ORDER — LORAZEPAM 1 MG PO TABS: 1.0000 mg | ORAL_TABLET | ORAL | Status: DC | PRN

## 2023-05-22 MED ORDER — BUSPIRONE HCL 10 MG PO TABS
15.0000 mg | ORAL_TABLET | Freq: Two times a day (BID) | ORAL | Status: DC
  Administered 2023-05-22: 15 mg via ORAL
  Filled 2023-05-22: qty 2

## 2023-05-22 MED ORDER — VITAMIN B-1 100 MG PO TABS
100.0000 mg | ORAL_TABLET | Freq: Every day | ORAL | Status: DC
  Filled 2023-05-22 (×2): qty 1

## 2023-05-22 MED ORDER — LORAZEPAM 1 MG PO TABS: 2.0000 mg | ORAL_TABLET | ORAL | Status: DC | PRN

## 2023-05-22 MED ORDER — LOPERAMIDE HCL 2 MG PO CAPS: 2.0000 mg | ORAL_CAPSULE | ORAL | Status: AC | PRN

## 2023-05-22 MED ORDER — LACTULOSE ENCEPHALOPATHY 10 GM/15ML PO SOLN: 10.0000 g | Freq: Every day | ORAL | Status: DC | PRN

## 2023-05-22 MED ORDER — BUSPIRONE HCL 15 MG PO TABS
15.0000 mg | ORAL_TABLET | Freq: Two times a day (BID) | ORAL | Status: DC
  Administered 2023-05-22 – 2023-05-23 (×3): 15 mg via ORAL
  Filled 2023-05-22 (×7): qty 1

## 2023-05-22 MED ORDER — LORAZEPAM 2 MG/ML IJ SOLN: 2.0000 mg | Freq: Three times a day (TID) | INTRAMUSCULAR | Status: DC | PRN

## 2023-05-22 MED ORDER — LORAZEPAM 1 MG PO TABS
1.0000 mg | ORAL_TABLET | Freq: Two times a day (BID) | ORAL | Status: AC
  Administered 2023-05-25 (×2): 1 mg via ORAL
  Filled 2023-05-22 (×2): qty 1

## 2023-05-22 MED ORDER — VORTIOXETINE HBR 5 MG PO TABS
10.0000 mg | ORAL_TABLET | Freq: Every day | ORAL | Status: DC
  Administered 2023-05-22: 10 mg via ORAL
  Filled 2023-05-22: qty 2

## 2023-05-22 MED ORDER — ADULT MULTIVITAMIN W/MINERALS CH
1.0000 | ORAL_TABLET | Freq: Every day | ORAL | Status: DC
  Administered 2023-05-22 – 2023-05-27 (×6): 1 via ORAL
  Filled 2023-05-22 (×9): qty 1

## 2023-05-22 NOTE — Tx Team (Signed)
Initial Treatment Plan 05/22/2023 5:40 PM Valerie Fredin ZOX:[id number]    PATIENT STRESSORS: Financial difficulties   Marital or family conflict     PATIENT STRENGTHS: Active sense of humor  Average or above average intelligence  Supportive family/friends    PATIENT IDENTIFIED PROBLEMS: Coping skills                     DISCHARGE CRITERIA:  Ability to meet basic life and health needs Improved stabilization in mood, thinking, and/or behavior Reduction of life-threatening or endangering symptoms to within safe limits  PRELIMINARY DISCHARGE PLAN: Attend aftercare/continuing care group Attend 12-step recovery group Outpatient therapy Participate in family therapy  PATIENT/FAMILY INVOLVEMENT: This treatment plan has been presented to and reviewed with the patient, Hazaiah Edgecombe.  The patient has been given the opportunity to ask questions and make suggestions.  Gardiner Barefoot, RN 05/22/2023, 5:40 PM

## 2023-05-22 NOTE — Plan of Care (Signed)
  Problem: Education: Goal: Emotional status will improve Outcome: Not Progressing Goal: Mental status will improve Outcome: Not Progressing   Problem: Coping: Goal: Coping ability will improve Outcome: Not Progressing   
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
## 2023-05-22 NOTE — ED Notes (Addendum)
Called report to Florentina Addison RN at [facility] and [facility] to transport patient to [facility]
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
## 2023-05-22 NOTE — ED Notes (Signed)
IVC scanned into chart and E-file. Currently awaiting case #

## 2023-05-22 NOTE — BH Assessment (Signed)
TTS clinician sent a secure message to the RN, Tommie Sams to see if the patient was available and able to participate in tele assessment. RN advised that there may not be a room available for Tele assessment at this time, but offered to look again. RN agreed to keep this TTS Clinician informed.
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
## 2023-05-22 NOTE — Progress Notes (Addendum)
Patient ID: Juan [name], male   DOB: Sep 20, 1965, 57 y.o.   MRN: [id number] Writer notified by RN that pt arrived unit with a rolling walker related to unstable gait. Walker assessed, and has a long wire from top to side, which is long enough to pose a ligature risk. Orders given for RN to give pt one of the approved pt walkers on the unit, and to take pt's personal walker and lock it with his belongings until at discharge. Physical therapy will be consulted to ascertain if our walker needs any modifications for patient. Hospital Ativan detox protocol discontinued, inpatient behavioral health Ativan detox protocol started. Pt denies recent alcohol use, states that he is >400 days sober from ETOH use, but [id number] at time of admission to ER on 03/29. Comprehensive behavioral health assessment to be completed tomorrow morning.  Addendum: Attempts made for patient to use one of the stepping walkers on the unit, and gait extremely unstable. RN will place patient in wheelchair until he is evaluated by PT for further recommendations.

## 2023-05-22 NOTE — ED Notes (Signed)
Pt has been dressed out and the daughter is taking the patient's belongings home with her.  Pt does walk with a Rolator. Pt has been wanded by security.

## 2023-05-22 NOTE — Progress Notes (Signed)
BHH/BMU LCSW Progress Note   05/22/2023    12:07 PM  Juan [name]   [id number]   Type of Contact and Topic:  Psychiatric Bed Placement   Pt accepted to [facility] 300-1   Patient meets inpatient criteria per Dahlia Byes, NP   The attending provider will be Dr. Sherron Flemings   Call report to  [phone]    Pearlie Oyster, Paramedic @ [facility] notified.     Pt scheduled  to arrive at [facility] TODAY @ 1315.   Damita Dunnings, MSW, LCSW-A  12:08 PM 05/22/2023

## 2023-05-23 ENCOUNTER — Encounter (HOSPITAL_COMMUNITY): Payer: Self-pay

## 2023-05-23 ENCOUNTER — Telehealth: Payer: Self-pay | Admitting: General Practice

## 2023-05-23 DIAGNOSIS — F1090 Alcohol use, unspecified, uncomplicated: Secondary | ICD-10-CM

## 2023-05-23 DIAGNOSIS — F3181 Bipolar II disorder: Secondary | ICD-10-CM

## 2023-05-23 DIAGNOSIS — M1990 Unspecified osteoarthritis, unspecified site: Secondary | ICD-10-CM

## 2023-05-23 HISTORY — DX: Alcohol use, unspecified, uncomplicated: F10.90

## 2023-05-23 HISTORY — DX: Unspecified osteoarthritis, unspecified site: M19.90

## 2023-05-23 HISTORY — DX: Bipolar II disorder: F31.81

## 2023-05-23 MED ORDER — NICOTINE 21 MG/24HR TD PT24
21.0000 mg | MEDICATED_PATCH | Freq: Every day | TRANSDERMAL | Status: DC
  Administered 2023-05-24 – 2023-05-27 (×4): 21 mg via TRANSDERMAL
  Filled 2023-05-23 (×5): qty 1

## 2023-05-23 MED ORDER — GABAPENTIN 100 MG PO CAPS
200.0000 mg | ORAL_CAPSULE | Freq: Three times a day (TID) | ORAL | Status: DC
  Administered 2023-05-23 – 2023-05-27 (×12): 200 mg via ORAL
  Filled 2023-05-23 (×16): qty 2

## 2023-05-23 MED ORDER — DICLOFENAC SODIUM 1 % EX GEL: 2.0000 g | Freq: Four times a day (QID) | CUTANEOUS | Status: DC | PRN

## 2023-05-23 MED ORDER — ARIPIPRAZOLE 5 MG PO TABS
5.0000 mg | ORAL_TABLET | Freq: Every day | ORAL | Status: DC
  Administered 2023-05-24 – 2023-05-26 (×3): 5 mg via ORAL
  Filled 2023-05-23 (×4): qty 1

## 2023-05-23 NOTE — Plan of Care (Signed)

## 2023-05-23 NOTE — Progress Notes (Signed)
   05/23/23 0523  15 Minute Checks  Location Bedroom  Visual Appearance Calm  Behavior Sleeping  Sleep (Behavioral Health Patients Only)  Calculate sleep? (Click Yes once per 24 hr at 0600 safety check) Yes  Documented sleep last 24 hours 6.5

## 2023-05-23 NOTE — Group Note (Signed)
Recreation Therapy Group Note   Group Topic:Stress Management  Group Date: 05/23/2023 Start Time: 0254 End Time: 0956 Facilitators: Adi Doro-McCall, LRT,CTRS Location: 300 Hall Dayroom   Group Topic: Stress Management  Goal Area(s) Addresses:  Patient will identify positive stress management techniques. Patient will identify benefits of using stress management post d/c.  Group Description: Meditation. LRT played a meditation from the Calm app that focused on taking in the characteristics of a mountain. It encouraged participates to envision how the mountain stands tall and endures whatever it's confronted with (ie. Changing weather, different seasons, time of day) and encouraged patients to take on that same attitude when they are faced with the challenges of life.   Education:  Stress Management, Discharge Planning.   Education Outcome: Acknowledges Education   Affect/Mood: N/A   Participation Level: Did not attend    Clinical Observations/Individualized Feedback:     Plan: Continue to engage patient in RT group sessions 2-3x/week.   Jayro Mcmath-McCall, LRT,CTRS 05/23/2023 12:09 PM

## 2023-05-23 NOTE — BH IP Treatment Plan (Signed)
Interdisciplinary Treatment and Diagnostic Plan Update  05/23/2023 Time of Session: 10:45am Juan [name] MRN: [id number]  Principal Diagnosis: Major depressive disorder, recurrent severe without psychotic features (HCC)  Secondary Diagnoses: Principal Problem:   Major depressive disorder, recurrent severe without psychotic features (HCC)   Current Medications:  Current Facility-Administered Medications  Medication Dose Route Frequency Provider Last Rate Last Admin   acetaminophen (TYLENOL) tablet 650 mg  650 mg Oral Q6H PRN Dahlia Byes C, NP       alum & mag hydroxide-simeth (MAALOX/MYLANTA) 200-200-20 MG/5ML suspension 30 mL  30 mL Oral Q4H PRN Welford Roche, Josephine C, NP       busPIRone (BUSPAR) tablet 15 mg  15 mg Oral BID Dahlia Byes C, NP   15 mg at 05/23/23 0800   diphenhydrAMINE (BENADRYL) capsule 50 mg  50 mg Oral TID PRN Earney Navy, NP       Or   diphenhydrAMINE (BENADRYL) injection 50 mg  50 mg Intramuscular TID PRN Earney Navy, NP       folic acid (FOLVITE) tablet 1 mg  1 mg Oral Daily Onuoha, Josephine C, NP   1 mg at 05/23/23 0800   hydrOXYzine (ATARAX) tablet 25 mg  25 mg Oral Q6H PRN Starleen Blue, NP       lactulose (CHRONULAC) 10 GM/15ML solution 10 g  10 g Oral Daily PRN Dahlia Byes C, NP       loperamide (IMODIUM) capsule 2-4 mg  2-4 mg Oral PRN Nkwenti, Doris, NP       LORazepam (ATIVAN) tablet 2 mg  2 mg Oral TID PRN Earney Navy, NP       Or   LORazepam (ATIVAN) injection 2 mg  2 mg Intramuscular TID PRN Earney Navy, NP       LORazepam (ATIVAN) tablet 1 mg  1 mg Oral QID Starleen Blue, NP   1 mg at 05/23/23 1129   Followed by   Melene Muller ON 05/24/2023] LORazepam (ATIVAN) tablet 1 mg  1 mg Oral TID Starleen Blue, NP       Followed by   Melene Muller ON 05/25/2023] LORazepam (ATIVAN) tablet 1 mg  1 mg Oral BID Starleen Blue, NP       Followed by   Melene Muller ON 05/26/2023] LORazepam (ATIVAN) tablet 1 mg  1 mg Oral Daily Nkwenti,  Doris, NP       multivitamin with minerals tablet 1 tablet  1 tablet Oral Daily Dahlia Byes C, NP   1 tablet at 05/23/23 0759   pantoprazole (PROTONIX) EC tablet 40 mg  40 mg Oral Daily Dahlia Byes C, NP   40 mg at 05/23/23 0800   thiamine (Vitamin B-1) tablet 100 mg  100 mg Oral Daily Dahlia Byes C, NP   100 mg at 05/23/23 0800   Or   thiamine (VITAMIN B1) injection 100 mg  100 mg Intravenous Daily Dahlia Byes C, NP       vortioxetine HBr (TRINTELLIX) tablet 10 mg  10 mg Oral Daily Starleen Blue, NP   10 mg at 05/23/23 0800   PTA Medications: Medications Prior to Admission  Medication Sig Dispense Refill Last Dose   busPIRone (BUSPAR) 15 MG tablet Take 1 tablet (15 mg total) by mouth 2 (two) times daily. 180 tablet 2    diazepam (VALIUM) 10 MG tablet Take 1 tablet by mouth as directed; (take 1 tablet by mouth 2 hrs before appointment and bring the rest with you) 3 tablet 0  diclofenac Sodium (VOLTAREN) 1 % GEL Apply 4 grams topically 4 (four) times daily to affected joint. (Patient not taking: Reported on 05/22/2023) 100 g 11    erythromycin ophthalmic ointment Apply a 1 centimeter ribbon into the conjunctival sac(s) in affected eye(s) 2 times daily. (Patient not taking: Reported on 05/22/2023) 3.5 g 0    furosemide (LASIX) 40 MG tablet Take 1 tablet (40 mg) by mouth daily. (Patient taking differently: Take 40 mg by mouth as needed for edema.) 30 tablet 1    hydrOXYzine (ATARAX) 25 MG tablet Take 0.5-1 tablets (12.5-25 mg total) by mouth every 8 (eight) hours as needed for itching. 30 tablet 1    lactulose, encephalopathy, (CHRONULAC) 10 GM/15ML SOLN Take 10 g by mouth daily as needed (constipation).      magnesium oxide (MAG-OX) 400 MG tablet Take 1 tablet (400 mg total) by mouth 2 (two) times daily. 120 tablet 2    ondansetron (ZOFRAN-ODT) 8 MG disintegrating tablet Dissolve 1 tablet (8 mg total) by mouth every 8 (eight) hours as needed for nausea. 30 tablet 1     pantoprazole (PROTONIX) 40 MG tablet Take 1 tablet (40 mg total) by mouth daily. (Patient not taking: Reported on 05/22/2023) 30 tablet 1    potassium chloride SA (KLOR-CON M) 20 MEQ tablet Take 1 tablet (20 mEq total) by mouth 2 (two) times daily. 180 tablet 1    saccharomyces boulardii (FLORASTOR) 250 MG capsule Take 250 mg by mouth 2 (two) times daily.       traZODone (DESYREL) 50 MG tablet Take 1-2 tablets (50-100 mg total) by mouth at bedtime as needed for sleep. 90 tablet 3    UNKNOWN TO PATIENT Apply 1 patch topically at bedtime. Sweet Dreams patch      vortioxetine HBr (TRINTELLIX) 10 MG TABS tablet Take 1 tablet (10 mg total) by mouth daily. (Patient not taking: Reported on 05/22/2023) 30 tablet 3     Patient Stressors: Financial difficulties   Marital or family conflict    Patient Strengths: Active sense of humor  Average or above average intelligence  Supportive family/friends   Treatment Modalities: Medication Management, Group therapy, Case management,  1 to 1 session with clinician, Psychoeducation, Recreational therapy.   Physician Treatment Plan for Primary Diagnosis: Major depressive disorder, recurrent severe without psychotic features (HCC) Long Term Goal(s):     Short Term Goals:    Medication Management: Evaluate patient's response, side effects, and tolerance of medication regimen.  Therapeutic Interventions: 1 to 1 sessions, Unit Group sessions and Medication administration.  Evaluation of Outcomes: Progressing  Physician Treatment Plan for Secondary Diagnosis: Principal Problem:   Major depressive disorder, recurrent severe without psychotic features (HCC)  Long Term Goal(s):     Short Term Goals:       Medication Management: Evaluate patient's response, side effects, and tolerance of medication regimen.  Therapeutic Interventions: 1 to 1 sessions, Unit Group sessions and Medication administration.  Evaluation of Outcomes: Progressing   RN Treatment  Plan for Primary Diagnosis: Major depressive disorder, recurrent severe without psychotic features (HCC) Long Term Goal(s): Knowledge of disease and therapeutic regimen to maintain health will improve  Short Term Goals: Ability to remain free from injury will improve, Ability to verbalize frustration and anger appropriately will improve, Ability to participate in decision making will improve, Ability to verbalize feelings will improve, Ability to identify and develop effective coping behaviors will improve, and Compliance with prescribed medications will improve  Medication Management: RN will administer medications  as ordered by provider, will assess and evaluate patient's response and provide education to patient for prescribed medication. RN will report any adverse and/or side effects to prescribing provider.  Therapeutic Interventions: 1 on 1 counseling sessions, Psychoeducation, Medication administration, Evaluate responses to treatment, Monitor vital signs and CBGs as ordered, Perform/monitor CIWA, COWS, AIMS and Fall Risk screenings as ordered, Perform wound care treatments as ordered.  Evaluation of Outcomes: Progressing   LCSW Treatment Plan for Primary Diagnosis: Major depressive disorder, recurrent severe without psychotic features (HCC) Long Term Goal(s): Safe transition to appropriate next level of care at discharge, Engage patient in therapeutic group addressing interpersonal concerns.  Short Term Goals: Engage patient in aftercare planning with referrals and resources, Increase social support, Increase emotional regulation, Facilitate acceptance of mental health diagnosis and concerns, Identify triggers associated with mental health/substance abuse issues, and Increase skills for wellness and recovery  Therapeutic Interventions: Assess for all discharge needs, 1 to 1 time with Social worker, Explore available resources and support systems, Assess for adequacy in community support  network, Educate family and significant other(s) on suicide prevention, Complete Psychosocial Assessment, Interpersonal group therapy.  Evaluation of Outcomes: Progressing   Progress in Treatment: Attending groups: Yes. Participating in groups: Yes. Taking medication as prescribed: Yes. Toleration medication: Yes. Family/Significant other contact made: No, will contact:  Azalia Bilis ( wife) [phone] Patient understands diagnosis: Yes. Discussing patient identified problems/goals with staff: Yes. Medical problems stabilized or resolved: Yes. Denies suicidal/homicidal ideation: Yes. Issues/concerns per patient self-inventory: No.  New problem(s) identified: No, Describe:  none reported   New Short Term/Long Term Goal(s):medication stabilization, elimination of SI thoughts, development of comprehensive mental wellness plan.    Patient Goals:  "coping skills"  Discharge Plan or Barriers: Patient recently admitted. CSW will continue to follow and assess for appropriate referrals and possible discharge planning.    Reason for Continuation of Hospitalization: Depression Medication stabilization Suicidal ideation  Estimated Length of Stay:5-7 days   Last 3 [place] Suicide Severity Risk Score: Flowsheet Row [facility] (Current[facility] Most recent reading at 05/22/2023  2:03 PM ED from 05/22/2023 in [facility] Emergency Department at [facility] Most recent reading at 05/22/2023 12:16 AM ED from 04/03/2023 in [facility] Emergency Department at [facility] Most recent reading at 04/03/2023  3:09 PM  C-SSRS RISK CATEGORY No Risk High Risk No Risk       Last PHQ 2/9 Scores:    05/12/2023    2:13 PM 01/12/2023   10:43 AM 01/12/2023   10:16 AM  Depression screen PHQ 2/9  Decreased Interest 0 1 1  Down, Depressed, Hopeless 0 1 1  PHQ - 2 Score 0 2 2  Altered sleeping 2 0   Tired, decreased energy 2 0    Change in appetite 1 0   Feeling bad or failure about yourself  1 1   Trouble concentrating 1 0   Moving slowly or fidgety/restless 0 1   Suicidal thoughts 0 0   PHQ-9 Score 7 4   Difficult doing work/chores Somewhat difficult Somewhat difficult     Scribe for Treatment Team: Izell Richland, LCSW 05/23/2023 3:01 PM

## 2023-05-23 NOTE — Plan of Care (Signed)
OT to follow in order to address ADLs in this [facility] setting  Kerrin Champagne, OT

## 2023-05-23 NOTE — Group Note (Signed)
Date:  05/23/2023 Time:  9:28 AM  Group Topic/Focus:  Goals Group:   The focus of this group is to help patients establish daily goals to achieve during treatment and discuss how the patient can incorporate goal setting into their daily lives to aide in recovery.    Participation Level:  Active  Participation Quality:  Appropriate  Affect:  Appropriate  Cognitive:  Appropriate  Insight: Appropriate  Engagement in Group:  Engaged  Modes of Intervention:  Discussion  Additional Comments:  Pt goal:  Try to learn how to let things roll off my back and not get so upset.   Donell Beers 05/23/2023, 9:28 AM

## 2023-05-23 NOTE — Progress Notes (Signed)
Patient approached as treatment team meeting scheduled for this pt. When writer entered room, found pt tremulous holding onto the bathroom wall and door soaked with urine and with urine covering floor and saturated into his socks. Pt immediately assisted as very unsteady gait and with extreme risk for falls. Pt educated on fall risk safety measures and informed MD due to concern for safety. MD placed order for 1.1. Informed primary RN, Onalee Hua. And Informed RN [facility] Danika and CN due to above.
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
## 2023-05-23 NOTE — Progress Notes (Signed)
   05/22/23 2139  Psych Admission Type (Psych Patients Only)  Admission Status Involuntary  Psychosocial Assessment  Patient Complaints Anxiety  Eye Contact Fair  Facial Expression Animated  Affect Depressed  Speech Logical/coherent  Interaction Assertive  Motor Activity Unsteady;Tremors  Appearance/Hygiene In scrubs  Behavior Characteristics Cooperative;Calm  Mood Preoccupied  Thought Process  Coherency WDL  Content Blaming others;Blaming self  Delusions None reported or observed  Perception WDL  Hallucination None reported or observed  Judgment Impaired  Confusion None  Danger to Self  Current suicidal ideation? Denies  Agreement Not to Harm Self Yes  Description of Agreement verbal  Danger to Others  Danger to Others None reported or observed

## 2023-05-23 NOTE — Progress Notes (Signed)
PT Note  Patient Details Name: Juan [name] MRN: [id number] DOB: [birth year]    PT order received. Our OT who covers BH will see and assess pt. If any further PT needs, he will let us know. If he can address all issues then PT will sign off.   Thank you     Juan [name] 05/23/2023, 9:44 AM Juan [name], PT, MPT Acute Rehabilitation Services Office: [phone] If a weekend: secure chat groups: [facility] PT, [facility] OT, [facility] SLP 05/23/2023

## 2023-05-23 NOTE — [facility] Group Notes (Signed)

## 2023-05-23 NOTE — Progress Notes (Signed)
Patient ID: Juan [name], male   DOB: 26-Nov-1965, 57 y.o.   MRN: [id number] Pt remains on 1.1. Pt has again had another incontinent episode and continues to require 1 plus assistance with adls. Pt was assisted to bathroom and new brief placed. Pt remains on 1.1. and is high fall risk. Pt 1.1. is at arms length and gait belt in room for use.

## 2023-05-23 NOTE — H&P (Signed)
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 affected eye(s) 2 times daily. (Patient not taking: Reported on 05/22/2023) 3.5 g 0    furosemide (LASIX) 40 MG tablet Take 1 tablet (40 mg) by mouth daily. (Patient taking differently: Take 40 mg by mouth as needed for edema.) 30 tablet 1    hydrOXYzine (ATARAX) 25 MG tablet Take 0.5-1 tablets (12.5-25 mg total) by mouth every 8 (eight) hours as needed for itching. 30  tablet 1    lactulose, encephalopathy, (CHRONULAC) 10 GM/15ML SOLN Take 10 g by mouth daily as needed (constipation).      magnesium oxide (MAG-OX) 400 MG tablet Take 1 tablet (400 mg total) by mouth 2 (two) times daily. 120 tablet 2    ondansetron (ZOFRAN-ODT) 8 MG disintegrating tablet Dissolve 1 tablet (8 mg total) by mouth every 8 (eight) hours as needed for nausea. 30 tablet 1    pantoprazole (PROTONIX) 40 MG tablet Take 1 tablet (40 mg total) by mouth daily. (Patient not taking: Reported on 05/22/2023) 30 tablet 1    potassium chloride SA (KLOR-CON M) 20 MEQ tablet Take 1 tablet (20 mEq total) by mouth 2 (two) times daily. 180 tablet 1    saccharomyces boulardii (FLORASTOR) 250 MG capsule Take 250 mg by mouth 2 (two) times daily.       traZODone (DESYREL) 50 MG tablet Take 1-2 tablets (50-100 mg total) by mouth at bedtime as needed for sleep. 90 tablet 3    UNKNOWN TO  PATIENT Apply 1 patch topically at bedtime. Sweet Dreams patch      vortioxetine HBr (TRINTELLIX) 10 MG TABS tablet Take 1 tablet (10 mg total) by mouth daily. (Patient not taking: Reported on 05/22/2023) 30 tablet 3    Musculoskeletal: Strength & Muscle Tone: within normal limits Gait & Station: normal Patient leans: N/A Psychiatric Specialty Exam:  Presentation  General Appearance: Casual  Eye Contact:Fair  Speech:Clear and Coherent  Speech Volume:Decreased  Handedness:Right   Mood and Affect  Mood:Depressed; Anxious  Affect:Congruent   Thought Process  Thought Processes:Coherent  Duration of Psychotic Symptoms: na Past Diagnosis of Schizophrenia or Psychoactive disorder: No  Descriptions of Associations:Intact  Orientation:Full (Time, Place and Person)  Thought Content:Logical  Hallucinations:Hallucinations: None  Ideas of Reference:None  Suicidal Thoughts:Suicidal Thoughts: No  Homicidal Thoughts:Homicidal Thoughts: No   Sensorium  Memory:Immediate  Good  Judgment:Poor  Insight:Poor   Executive Functions  Concentration:Fair  Attention Span:Fair  Recall:Fair  Fund of Knowledge:Fair  Language:Good   Psychomotor Activity  Psychomotor Activity:Psychomotor Activity: Decreased; Psychomotor Retardation; Other (comment) (grossly unsteady gait)   Assets  Assets:Communication Skills; Resilience   Sleep  Sleep:Sleep: Poor    Physical Exam: Physical Exam Constitutional:      Appearance: Normal appearance.  Eyes:     Pupils: Pupils are equal, round, and reactive to light.  Pulmonary:     Effort: Pulmonary effort is normal.  Musculoskeletal:        General: Swelling (b/l LEs +2 edema) present.     Cervical back: Normal range of motion.     Right lower leg: Edema present.     Left lower leg: Edema present.  Neurological:     General: No focal deficit present.     Mental Status: He is alert and oriented to person, place, and time.    Review of Systems  Constitutional: Negative.   HENT: Negative.    Eyes: Negative.   Respiratory: Negative.    Cardiovascular: Negative.   Gastrointestinal:  Negative for heartburn.  Genitourinary: Negative.   Musculoskeletal: Negative.   Skin: Negative.   Neurological:  Negative for dizziness.  Psychiatric/Behavioral:  Positive for depression and substance abuse. Negative for hallucinations, memory loss and suicidal ideas. Juan patient is nervous/anxious and has insomnia.    Blood pressure 122/83, pulse 73, temperature 97.6 F (36.4 C), resp. rate 18, height 5\' 5"  (1.651 m), weight 77.4 kg, SpO2 97%. Body mass index is 28.39 kg/m.  Treatment Plan Summary: Daily contact with patient to assess and evaluate symptoms and progress in treatment and Medication management  Safety and Monitoring: Voluntary admission to inpatient psychiatric unit for safety, stabilization and treatment Daily contact with patient to assess and evaluate symptoms and progress in treatment Patient's case to  be discussed in multi-disciplinary team meeting Observation Level : 1 on 1 monitoring for safety-high fall risk, may use own rollator while on [name]-on-[name] monitoring Vital signs: q12 hours Precautions: Safety  Long Term Goal(s): Improvement in symptoms so as ready for discharge  Short Term Goals: Ability to identify changes in lifestyle to reduce recurrence of condition will improve, Ability to verbalize feelings will improve, Ability to disclose and discuss suicidal ideas, Ability to demonstrate self-control will improve, Ability to identify and develop effective coping behaviors will improve, Ability to maintain clinical measurements within normal limits will improve, Compliance with prescribed medications will improve, and Ability to identify triggers associated with substance abuse/mental health issues will improve  Diagnoses Principal Problem:   Bipolar 2 disorder, major depressive episode (  HCC) Active Problems:   GAD (generalized anxiety disorder)   PTSD (post-traumatic stress disorder)   Tobacco chew use   Alcohol use disorder   Arthritis  Medications -Start gabapentin 200 mg 3 times daily for neuropathy, alcohol use disorder, GAD. -Discontinue Trintellix as patient states it is not effective -Discontinue BuSpar as patient states it is not effective -Start Abilify 5 mg daily for mood stabilization -Will start Lexapro 5 mg on 10/2 for depressive symptoms -Continue Ativan detox protocol for alcohol use-please see tomorrow for complete order -Continue Protonix 40 mg daily for GERD -Discontinue trazodone due to complaints of vivid dreams -Start nicotine patch 21 mg for tobacco use  Other PRNS -Continue Tylenol 650 mg every 6 hours PRN for mild pain -Continue Maalox 30 mg every 4 hrs PRN for indigestion -Continue Milk of Magnesia as needed every 6 hrs for constipation  Discharge Planning: Social work and case management to assist with discharge planning and identification of  hospital follow-up needs prior to discharge Estimated Juan: 5-7 days Discharge Concerns: Need to establish a safety plan; Medication compliance and effectiveness Discharge Goals: Return home with outpatient referrals for mental health follow-up including medication management/psychotherapy  I certify that inpatient services furnished can reasonably be expected to improve Juan patient's condition.    Starleen Blue, NP 9/30/20246:24 PM

## 2023-05-23 NOTE — Progress Notes (Signed)
Patient ID: Juan [name], male   DOB: 1966/06/08, 57 y.o.   MRN: [id number] Patient with second incontinent episode this shift. Pt requires 1+ assistance to complete adls and clean self. Staff assisted pt in peri care and patient place in briefs as he reports he '' has been incontinent for 2 - 3 years '' and has no control of his bladder. 1.1. staff placed at arms length and again pt reminded of fall risk education. Pt is safe. Will con't to monitor.

## 2023-05-23 NOTE — Transitions of Care (Post Inpatient/ED Visit) (Signed)
05/23/2023  Name: Juan [name] MRN: [id number] DOB: 02/25/1966  Today's TOC FU Call Status: Today's TOC FU Call Status:: Unsuccessful Call (1st Attempt) Unsuccessful Call (1st Attempt) Date: 05/23/23  Attempted to reach the patient regarding the most recent Inpatient/ED visit.  Follow Up Plan: Additional outreach attempts will be made to reach the patient to complete the Transitions of Care (Post Inpatient/ED visit) call.   Signature Modesto Charon, Control and instrumentation engineer

## 2023-05-23 NOTE — Plan of Care (Signed)
  Problem: Education: Goal: Emotional status will improve Outcome: Not Progressing Goal: Mental status will improve Outcome: Not Progressing   
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
## 2023-05-24 DIAGNOSIS — F3181 Bipolar II disorder: Secondary | ICD-10-CM | POA: Diagnosis not present

## 2023-05-24 NOTE — Plan of Care (Signed)
  Problem: Education: Goal: Knowledge of [place] [facility] Education information/materials will improve Outcome: Progressing   Problem: Education: Goal: Emotional status will improve Outcome: Progressing   Problem: Education: Goal: Mental status will improve Outcome: Progressing   Problem: Coping: Goal: Ability to verbalize frustrations and anger appropriately will improve Outcome: Progressing   Problem: Safety: Goal: Periods of time without injury will increase Outcome: Progressing   Problem: Self-Concept: Goal: Will verbalize positive feelings about self Outcome: Progressing

## 2023-05-24 NOTE — Progress Notes (Signed)
   05/23/23 2150  Psych Admission Type (Psych Patients Only)  Admission Status Involuntary  Psychosocial Assessment  Patient Complaints Anxiety;Depression  Eye Contact Fair  Facial Expression Animated  Affect Depressed  Speech Logical/coherent  Interaction Assertive  Motor Activity Unsteady;Tremors  Appearance/Hygiene In scrubs  Behavior Characteristics Cooperative  Mood Depressed  Thought Process  Coherency WDL  Content WDL  Delusions None reported or observed  Perception WDL  Hallucination None reported or observed  Judgment Impaired  Confusion None  Danger to Self  Current suicidal ideation? Denies  Agreement Not to Harm Self Yes  Description of Agreement Verbal  Danger to Others  Danger to Others None reported or observed

## 2023-05-24 NOTE — Group Note (Signed)
LCSW Group Therapy Note   Group Date: 05/24/2023 Start Time: 1100 End Time: 1200   Type of Therapy and Topic:  Group Therapy - Coping Skills For Anxiety and Depression  Participation Level:  Did Not Attend   Description of Group The focus of this group was to determine what healthy coping techniques would be helpful for group members in coping with anxiety and depression in their daily lives. The group began with patients introducing themselves and revealing one healthy and one unhealthy way they have coped with anxiety or depression in the past. Patients were guided through different techniques for coping with anxiety in a healthy way, including deep breathing, progressive muscle relaxation, challenging irrational thoughts, and mental imagery. Patients were then guided though different techniques for coping with depression in a healthy way, including behavioral activation, increasing social supports, focusing on positive experiences, and mindfulness. Differences between healthy and unhealthy coping techniques were pointed out when brought up by group members. Patients were asked to identify 2-3 healthy coping skills they would like to learn to use more effectively after being guided through these different techniques.These were explained, samples demonstrated, and resources shared for how to learn more at discharge.  Therapeutic Goals Patients learned that coping is what human beings do to deal with various situations in their lives. Patients learned various healthy coping techniques for anxiety and depression Patients determined 2-3 healthy coping skills they would like to become more familiar with and use more often. Patients provided support and ideas to each other.   Summary of Patient Progress:    Did not attend group    Therapeutic Modalities Cognitive Behavioral Therapy Motivational Interviewing Dialectical Behavioral Therapy  Izell [place], LCSW 05/24/2023  1:28 PM

## 2023-05-24 NOTE — Group Note (Signed)
Recreation Therapy Group Note   Group Topic:Animal Assisted Therapy   Group Date: 05/24/2023 Start Time: 0946 End Time: 1030 Facilitators: Zavian Slowey-McCall, LRT,CTRS Location: [address] Dayroom   Animal-Assisted Activity (AAA) Program Checklist/Progress Notes Patient Eligibility Criteria Checklist & Daily Group note for Rec Tx Intervention  AAA/T Program Assumption of Risk Form signed by Patient/ or Parent Legal Guardian Yes  Patient understands his/her participation is voluntary Yes  Education: Charity fundraiser, Appropriate Animal Interaction   Education Outcome: Acknowledges education.    Affect/Mood: N/A   Participation Level: Did not attend    Clinical Observations/Individualized Feedback:     Plan: Continue to engage patient in RT group sessions 2-3x/week.   Arianni Gallego-McCall, LRT,CTRS 05/24/2023 1:40 PM

## 2023-05-24 NOTE — Evaluation (Signed)
Physical Therapy Evaluation Patient Details Name: Juan [name] MRN: [id number] DOB: 11/18/1965 Today's Date: 05/24/2023  History of Present Illness  57 y.o. male admitted to [facility] for suicide ideation. Patient was IVC to [facility] Hx of chronic pain, repeated falls, muscular dystrophy, chronic fatigue, cirrhosis, ETOH use d/o, PTSD, anxiety.  Clinical Impression  On eval at [facility], pt required Mod A to stand and Min A to ambulate ~20 feet with a standard RW. Pt presents with general weakness, decreased activity tolerance, and impaired gait and balance. Pt remains at risk for falls when mobilizing. Recommend short distance ambulation with assistance. Recommended continued use of wheelchair for longer distances (for ex: going to dining room). Instructed pt to try to perform 5 reps of sit to stand exercise with assistance as able for strengthening. Will plan to follow pt during this stay. Patient will benefit from continued inpatient follow up therapy, <3 hours/day. If skilled setting not an option, recommend HHPT f/u.         If plan is discharge home, recommend the following: Assistance with cooking/housework;Assist for transportation;Direct supervision/assist for financial management;Direct supervision/assist for medications management;Help with stairs or ramp for entrance;A lot of help with walking and/or transfers;A lot of help with bathing/dressing/bathroom   Can travel by private vehicle        Equipment Recommendations None recommended by PT  Recommendations for Other Services       Functional Status Assessment Patient has had a recent decline in their functional status and demonstrates the ability to make significant improvements in function in a reasonable and predictable amount of time.     Precautions / Restrictions Precautions Precautions: Fall Precaution Comments: high fall risk; incontinent Restrictions Weight Bearing Restrictions: No      Mobility  Bed Mobility Overal bed  mobility: Modified Independent Bed Mobility: Sidelying to Sit           General bed mobility comments: Increased time. Cues to scoot to EOB    Transfers Overall transfer level: Needs assistance Equipment used: Rolling walker (2 wheels) Transfers: Sit to/from Stand Sit to Stand: Mod assist           General transfer comment: Assist to power up, stabilize, control descent. Pt attempting to sit before safely positioned in front of bed. Cues for safety, technique, hand placement. Multiple attempts befre successfully standing    Ambulation/Gait Ambulation/Gait assistance: Min assist Gait Distance (Feet): 20 Feet Assistive device: Rolling walker (2 wheels) Gait Pattern/deviations: Step-to pattern, Decreased dorsiflexion - left, Knee flexed in stance - left, Trunk flexed       General Gait Details: Cues for safety, sequencing, RW proximity. Assist to stabilize pt throughout distance. Assist to manage RW, especially with turns/changes in direction. Fall risk.  Stairs            Wheelchair Mobility     Tilt Bed    Modified Rankin (Stroke Patients Only)       Balance Overall balance assessment: Needs assistance, History of Falls Sitting-balance support: Feet supported Sitting balance-Leahy Scale: Fair     Standing balance support: Reliant on assistive device for balance, Bilateral upper extremity supported, During functional activity Standing balance-Leahy Scale: Poor                               Pertinent Vitals/Pain Pain Assessment Pain Assessment: Faces Faces Pain Scale: No hurt    Home Living Family/patient expects to be discharged to:: Private residence Living  Arrangements: Spouse/significant other Available Help at Discharge: Family;Available PRN/intermittently Type of Home: House Home Access: Stairs to enter Entrance Stairs-Rails: Left Entrance Stairs-Number of Steps: 4   Home Layout: One level Home Equipment: Agricultural consultant (2  wheels);Electric scooter      Prior Function Prior Level of Function : Needs assist             Mobility Comments: frequent falls, even with RW use. uses rollator in home. power scooter vs rollator outside of home ADLs Comments: requires assistance     Extremity/Trunk Assessment   Upper Extremity Assessment Upper Extremity Assessment: Defer to OT evaluation    Lower Extremity Assessment Lower Extremity Assessment: Generalized weakness (L LE weaker than R LE. Decreased DF L ankle.)    Cervical / Trunk Assessment Cervical / Trunk Assessment: Kyphotic  Communication   Communication Communication: No apparent difficulties  Cognition Arousal: Alert Behavior During Therapy: WFL for tasks assessed/performed Overall Cognitive Status: Within Functional Limits for tasks assessed                                          General Comments      Exercises     Assessment/Plan    PT Assessment Patient needs continued PT services  PT Problem List Decreased strength;Decreased range of motion;Decreased activity tolerance;Decreased balance;Decreased mobility;Decreased knowledge of use of DME;Pain;Decreased safety awareness       PT Treatment Interventions DME instruction;Gait training;Functional mobility training;Therapeutic activities;Therapeutic exercise;Balance training;Patient/family education    PT Goals (Current goals can be found in the Care Plan section)  Acute Rehab PT Goals Patient Stated Goal: none stated PT Goal Formulation: With patient Time For Goal Achievement: 06/07/23 Potential to Achieve Goals: Good    Frequency Min 1X/week     Co-evaluation               AM-PAC PT "6 Clicks" Mobility  Outcome Measure Help needed turning from your back to your side while in a flat bed without using bedrails?: None Help needed moving from lying on your back to sitting on the side of a flat bed without using bedrails?: A Little Help needed moving to  and from a bed to a chair (including a wheelchair)?: A Little Help needed standing up from a chair using your arms (e.g., wheelchair or bedside chair)?: A Lot Help needed to walk in hospital room?: A Lot Help needed climbing 3-5 steps with a railing? : Total 6 Click Score: 15    End of Session Equipment Utilized During Treatment: Gait belt Activity Tolerance: Patient tolerated treatment well Patient left: in bed;with call bell/phone within reach;with nursing/sitter in room   PT Visit Diagnosis: History of falling (Z91.81);Repeated falls (R29.6);Muscle weakness (generalized) (M62.81);Difficulty in walking, not elsewhere classified (R26.2);Other symptoms and signs involving the nervous system (R29.898)    Time: 1330-1400 PT Time Calculation (min) (ACUTE ONLY): 30 min   Charges:   PT Evaluation $PT Eval Low Complexity: 1 Low PT Treatments $Gait Training: 8-22 mins PT General Charges $$ ACUTE PT VISIT: 1 Visit            Faye Ramsay, PT Acute Rehabilitation  Office: [phone]

## 2023-05-24 NOTE — Progress Notes (Signed)
Nursing 1:1 Note  Pt is with occupational therapist talking in bed. Pt appears to be calm and breathing is unlabored. Pt was compliant with afternoon medication.

## 2023-05-24 NOTE — Progress Notes (Signed)
Patient appears depressed. Patient denies SI/HI/AVH. Pt reports anxiety is 3/10 and depression is 4/10. Pt reports good sleep and good appetite. Patient complied with morning medication with no reported side effects. Peri-care was done and brief was changed. Pt reports having muscular dystrophy. Patient remains safe on Q19min checks and contracts for safety.       05/24/23 0900  Psych Admission Type (Psych Patients Only)  Admission Status Involuntary  Psychosocial Assessment  Patient Complaints Anxiety;Depression  Eye Contact Fair  Facial Expression Animated  Affect Depressed  Speech Logical/coherent  Interaction Cautious  Motor Activity Tremors;Unsteady  Appearance/Hygiene In scrubs  Behavior Characteristics Cooperative  Mood Depressed  Thought Process  Coherency WDL  Content WDL  Delusions None reported or observed  Perception WDL  Hallucination None reported or observed  Judgment Impaired  Confusion None  Danger to Self  Current suicidal ideation? Denies  Agreement Not to Harm Self Yes  Description of Agreement verbal  Danger to Others  Danger to Others None reported or observed

## 2023-05-24 NOTE — Progress Notes (Signed)
   05/24/23 2256  Psych Admission Type (Psych Patients Only)  Admission Status Involuntary  Psychosocial Assessment  Patient Complaints Anxiety;Depression  Eye Contact Fair  Facial Expression Animated  Affect Depressed  Speech Logical/coherent  Interaction Assertive  Motor Activity Tremors;Unsteady  Appearance/Hygiene In scrubs  Behavior Characteristics Cooperative  Mood Depressed  Thought Process  Coherency WDL  Content WDL  Delusions None reported or observed  Perception WDL  Hallucination None reported or observed  Judgment Impaired  Confusion None  Danger to Self  Current suicidal ideation? Denies  Agreement Not to Harm Self Yes  Description of Agreement verbal  Danger to Others  Danger to Others None reported or observed

## 2023-05-24 NOTE — Progress Notes (Addendum)
Nursing Note 1:1  Pt was moved from bed to chair. Brief changed and peri-care completed. Pt lunch was brought and pt is currently eating. Pt is calm and breathing is unlabored.

## 2023-05-24 NOTE — Progress Notes (Signed)
1:1 Note: One on one care continues. Patient resting in bed. Respirations are unlabored. Patient remains incontinent of bladder and care provided by staff. Staff will continue to monitor.

## 2023-05-24 NOTE — Progress Notes (Signed)
   05/24/23 0555  15 Minute Checks  Location Bedroom  Visual Appearance Calm  Behavior Composed;Sleeping  Sleep (Behavioral Health Patients Only)  Calculate sleep? (Click Yes once per 24 hr at 0600 safety check) Yes  Documented sleep last 24 hours 6.5

## 2023-05-24 NOTE — Progress Notes (Signed)
1:1 Note: Patient remains on 1:1 care with MHT in patient's room. Patient is incontinent of bladder, patient requires x 2 staff assist with transfer from bed to his wheelchair, patient with unstable gait and and remains a high fall risk. Incontinent care performed by staff. Patient in no s/s of distress.

## 2023-05-24 NOTE — Progress Notes (Signed)
Nursing 1:1 Note   Pt is resting in room with breathing unlabored. Pt is laying in bed, and is calm. Pt sitter in room with pt.

## 2023-05-24 NOTE — Transitions of Care (Post Inpatient/ED Visit) (Signed)
05/24/2023  Name: Juan [name] MRN: [id number] DOB: 01-06-1966  Today's TOC FU Call Status: Today's TOC FU Call Status:: Unsuccessful Call (2nd Attempt) Unsuccessful Call (1st Attempt) Date: 05/23/23 Unsuccessful Call (2nd Attempt) Date: 05/24/23  Attempted to reach the patient regarding the most recent Inpatient/ED visit.  Follow Up Plan: Additional outreach attempts will be made to reach the patient to complete the Transitions of Care (Post Inpatient/ED visit) call.   Signature Modesto Charon, Control and instrumentation engineer

## 2023-05-24 NOTE — Group Note (Unsigned)
Date:  05/24/2023 Time:  5:19 PM  Group Topic/Focus:  Goals Group:   The focus of this group is to help patients establish daily goals to achieve during treatment and discuss how the patient can incorporate goal setting into their daily lives to aide in recovery. Orientation:   The focus of this group is to educate the patient on the purpose and policies of crisis stabilization and provide a format to answer questions about their admission.  The group details unit policies and expectations of patients while admitted.     Participation Level:  {BHH PARTICIPATION UEAVW:09811}  Participation Quality:  {BHH PARTICIPATION QUALITY:22265}  Affect:  {BHH AFFECT:22266}  Cognitive:  {BHH COGNITIVE:22267}  Insight: {BHH Insight2:20797}  Engagement in Group:  {BHH ENGAGEMENT IN BJYNW:29562}  Modes of Intervention:  {BHH MODES OF INTERVENTION:22269}  Additional Comments:  ***  Raylyn Carton M Anagha Loseke 05/24/2023, 5:19 PM

## 2023-05-24 NOTE — Progress Notes (Signed)
1:1 Note: Patient resting in bed. Respirations are even and unlabored. No s/s of distress noted. Patient remains on 1:1 care for safety due to unsteady gait. Routine monitor ongoing.
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
## 2023-05-25 ENCOUNTER — Encounter (HOSPITAL_COMMUNITY): Payer: Self-pay | Admitting: Nurse Practitioner

## 2023-05-25 ENCOUNTER — Other Ambulatory Visit: Payer: Self-pay

## 2023-05-25 ENCOUNTER — Inpatient Hospital Stay (HOSPITAL_COMMUNITY): Payer: Commercial Managed Care - PPO

## 2023-05-25 DIAGNOSIS — F3181 Bipolar II disorder: Secondary | ICD-10-CM | POA: Diagnosis not present

## 2023-05-25 LAB — COMPREHENSIVE METABOLIC PANEL
ALT: 28 U/L (ref 0–44)
AST: 63 U/L — ABNORMAL HIGH (ref 15–41)
Albumin: 2.8 g/dL — ABNORMAL LOW (ref 3.5–5.0)
Alkaline Phosphatase: 71 U/L (ref 38–126)
Anion gap: 8 (ref 5–15)
BUN: 11 mg/dL (ref 6–20)
CO2: 22 mmol/L (ref 22–32)
Calcium: 8.6 mg/dL — ABNORMAL LOW (ref 8.9–10.3)
Chloride: 105 mmol/L (ref 98–111)
Creatinine, Ser: 0.96 mg/dL (ref 0.61–1.24)
GFR, Estimated: >60 mL/min (ref >60)
Glucose, Bld: 111 mg/dL — ABNORMAL HIGH (ref 70–99)
Potassium: 3 mmol/L — ABNORMAL LOW (ref 3.5–5.1)
Sodium: 135 mmol/L (ref 135–145)
Total Bilirubin: 3.9 mg/dL — ABNORMAL HIGH (ref 0.3–1.2)
Total Protein: 6.4 g/dL — ABNORMAL LOW (ref 6.5–8.1)

## 2023-05-25 LAB — CBC WITH DIFFERENTIAL/PLATELET
Abs Immature Granulocytes: 0.01 10*3/uL (ref 0.00–0.07)
Basophils Absolute: 0.1 10*3/uL (ref 0.0–0.1)
Basophils Relative: 1 %
Eosinophils Absolute: 0.2 10*3/uL (ref 0.0–0.5)
Eosinophils Relative: 4 %
HCT: 29.8 % — ABNORMAL LOW (ref 39.0–52.0)
Hemoglobin: 9.2 g/dL — ABNORMAL LOW (ref 13.0–17.0)
Immature Granulocytes: 0 %
Lymphocytes Relative: 20 %
Lymphs Abs: 1.1 10*3/uL (ref 0.7–4.0)
MCH: 25.3 pg — ABNORMAL LOW (ref 26.0–34.0)
MCHC: 30.9 g/dL (ref 30.0–36.0)
MCV: 82.1 fL (ref 80.0–100.0)
Monocytes Absolute: 0.8 10*3/uL (ref 0.1–1.0)
Monocytes Relative: 14 %
Neutro Abs: 3.5 10*3/uL (ref 1.7–7.7)
Neutrophils Relative %: 61 %
Platelets: 60 10*3/uL — ABNORMAL LOW (ref 150–400)
RBC: 3.63 MIL/uL — ABNORMAL LOW (ref 4.22–5.81)
RDW: 23.8 % — ABNORMAL HIGH (ref 11.5–15.5)
WBC: 5.7 10*3/uL (ref 4.0–10.5)
nRBC: 0 % (ref 0.0–0.2)

## 2023-05-25 LAB — URINALYSIS, W/ REFLEX TO CULTURE (INFECTION SUSPECTED)
Bacteria, UA: NONE SEEN
Bilirubin Urine: NEGATIVE
Glucose, UA: NEGATIVE mg/dL
Hgb urine dipstick: NEGATIVE
Ketones, ur: NEGATIVE mg/dL
Leukocytes,Ua: NEGATIVE
Nitrite: NEGATIVE
Protein, ur: NEGATIVE mg/dL
Specific Gravity, Urine: 1.02 (ref 1.005–1.030)
pH: 7 (ref 5.0–8.0)

## 2023-05-25 LAB — AMMONIA: Ammonia: 64 umol/L — ABNORMAL HIGH (ref 9–35)

## 2023-05-25 LAB — CBG MONITORING, ED
Glucose-Capillary: 104 mg/dL — ABNORMAL HIGH (ref 70–99)
Glucose-Capillary: 106 mg/dL — ABNORMAL HIGH (ref 70–99)

## 2023-05-25 LAB — TROPONIN I (HIGH SENSITIVITY)
Troponin I (High Sensitivity): 5 ng/L (ref <18)
Troponin I (High Sensitivity): 6 ng/L (ref <18)

## 2023-05-25 MED ORDER — POTASSIUM CHLORIDE CRYS ER 20 MEQ PO TBCR
40.0000 meq | EXTENDED_RELEASE_TABLET | Freq: Once | ORAL | Status: AC
  Administered 2023-05-25: 40 meq via ORAL
  Filled 2023-05-25: qty 2

## 2023-05-25 MED ORDER — METOCLOPRAMIDE HCL 5 MG/ML IJ SOLN
10.0000 mg | Freq: Once | INTRAMUSCULAR | Status: AC
  Administered 2023-05-25: 10 mg via INTRAVENOUS
  Filled 2023-05-25: qty 2

## 2023-05-25 MED ORDER — LACTATED RINGERS IV BOLUS
250.0000 mL | Freq: Once | INTRAVENOUS | Status: AC
  Administered 2023-05-25: 250 mL via INTRAVENOUS
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
## 2023-05-25 NOTE — ED Notes (Signed)
Per [facility], pt needs geripsych. [facility] will not take him back right now, and pt needs to be placed back on consult list.

## 2023-05-25 NOTE — ED Notes (Signed)
Pt tolerates eating, has no difficulties. Dr. Anitra Lauth notified

## 2023-05-25 NOTE — Progress Notes (Signed)
Patient ID: Juan [name], male   DOB: 1966/03/26, 57 y.o.   MRN: [id number] Patient maintained on 1:1 monitoring for safety due to incontinence, and being a fall risk since admission to [facility]. He has also been using w/c due to grossly unsteady gait.  Vomited earlier today morning, and noted to be diaphoretic, and not as responsive as his baseline at arrival to the hospital, respirations even/unlabored, V/S WNL, able to follow directions, appears lethargic, orders given for nursing to transfer to the [facility] for medical workup. ED provider at the [facility] notified. Pt to be transferred via non emergency ambulance service.

## 2023-05-25 NOTE — Progress Notes (Signed)
Writer called by [facility] to evaluate patient who complained of nausea. Upon assessment, pt began to vomit large amount of undigested food. Pt noted to be diaphoretic, color pale.  Pt unresponsive initially and slowly began to respond to staff. V/S BP-112/67, HR, 88, O2 Sat-96% on R/A. MD notified and order given to transport pt to [facility] ED for evaluation . Report given to Maralyn Sago, Forensic scientist. Transport on unit to transport pt to ED.

## 2023-05-25 NOTE — ED Notes (Signed)
Platelets 60 reported to MD

## 2023-05-25 NOTE — ED Notes (Signed)
Pt given meal tray,. Pt sitting up in bed eating

## 2023-05-25 NOTE — Transitions of Care (Post Inpatient/ED Visit) (Signed)
05/25/2023  Name: Juan [name] MRN: [id number] DOB: Jan 31, 1966  Today's TOC FU Call Status: Today's TOC FU Call Status:: Unsuccessful Call (2nd Attempt) Unsuccessful Call (1st Attempt) Date: 05/23/23 Unsuccessful Call (2nd Attempt) Date: 05/24/23 Unsuccessful Call (3rd Attempt) Date: 05/25/23  Attempted to reach the patient regarding the most recent Inpatient/ED visit.  Follow Up Plan: No further outreach attempts will be made at this time. We have been unable to contact the patient.  Signature Modesto Charon, Control and instrumentation engineer

## 2023-05-25 NOTE — ED Notes (Signed)
Pt requesting to eat. Dr. Anitra Lauth notified.

## 2023-05-25 NOTE — [facility] Counselor (Signed)
05/25/23 @ 1856, this consultation has been deferred to the [facility] team. The [facility] Care Coordinator will schedule a time for patient to be assessed by the [facility] provider and communicate these details to the Emergency Department care team  You can reach [facility] at [phone].

## 2023-05-25 NOTE — Progress Notes (Addendum)
TOC consulted for substance abuse resources. Resources attached to AVS. Pt is under TTS care at this time. No further TOC needs.

## 2023-05-25 NOTE — Progress Notes (Signed)
1:1 Nursing Note: Patient sleeping. No distress noted. Continues 1:1 for safety. Patient remains safe. Repositioned in bed.

## 2023-05-25 NOTE — ED Triage Notes (Signed)
Patient from [facility] for one episode of vomiting this AM. Patient reports sudden onset of diaphoresis, nausea, and vomiting this AM. Reports he is feeling a bit better now. Denies SOB and chest pain. BGL 104.

## 2023-05-25 NOTE — ED Notes (Signed)
[facility] to bring over IVC paperwork, they did not bring paperwork earlier

## 2023-05-25 NOTE — ED Notes (Signed)
[facility] called to bring pt belongings over. Wife states walker and wedding band and clothes are still over at [facility]. [facility] states they will bring over belongings

## 2023-05-25 NOTE — Progress Notes (Signed)
[facility] MD Progress Note  05/25/2023 9:05 AM Juan [name]  MRN:  [id number]  Principal Problem: Bipolar 2 disorder, major depressive episode (HCC) Diagnosis: Principal Problem:   Bipolar 2 disorder, major depressive episode (HCC) Active Problems:   GAD (generalized anxiety disorder)   PTSD (post-traumatic stress disorder)   Tobacco chew use   Alcohol use disorder   Arthritis  Reason for admission: Juan [name] is a 57 year old Caucasian male with prior mental health history of GAD, PTSD, ETOH use d/o and MDD who presented today [name] on 09/29 with complaints of suicidal ideations with a plan to use a gun to shoot himself in the head.  As per ED documentations, patient shot the gun into a bag at his home prior to call his daughter to come remove the gun. Patient was subsequently IVC'd prior to being transferred to this hospital for treatment and stabilization of his mental status.    Patient assessment note, 05/24/2023: Pt's chart reviewed, case discussed with her treatment team. As per chart review, pt slept for through the night. He was maintained on 1:1 staff monitoring due to continuing to require complete assistance with ADLs. He is also incontinent of urine, and requires frequent assistance with changing his incontinent briefs, and is very unstable on his feet. Pt was placed in a wheelchair after admission to the unit due to being unstable on his feet. He was using a Rolator which had a piece of wire attached to it, and posed a ligature risk, thereby requiring to be secured. As per OT, the stepping walkers on the unit pose a higher fall risk for patient as he also has moderate cognitive impairment as per the [facility] assessment.   Pt presents during encounter with flat affect and depressed mood, attention to personal hygiene and grooming is fair, eye contact is good, speech is clear & coherent. Thought contents are organized and logical, and pt currently denies SI/HI/AVH or paranoia.  There is no evidence of delusional thoughts.  He however, seems to ;be minimizing his depressive symptoms as well as minimizing his alcohol use. He denies using alcohol prior to this admission, even though he presents with alcohol withdrawal symptoms (has tremors).  We are coordinating, with the team at the [facility] unit at the [place] to send patient there as we are unable to meet his needs here at [facility]; He is presenting with some cognitive impairments & physical limitations that might limit his ability to benefit from programming here at [facility].  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   Chronic fatigue    Cirrhosis (HCC)    Colon polyps    Depression    GERD (gastroesophageal reflux disease)    Hemochromatosis    Hiatal hernia 02/20/2022   Hx of blood clots    Leg   Hyperreflexia    Hypertension    PTSD (post-traumatic stress disorder)    Traumatic hemorrhagic shock [facility])     Past Surgical History:  Procedure Laterality Date   BIOPSY  09/24/2021   Procedure: BIOPSY;  Surgeon: Imogene Burn, MD;  Location: [facility] ENDOSCOPY;  Service: Gastroenterology;;   CARPAL TUNNEL RELEASE Bilateral    COLONOSCOPY WITH PROPOFOL N/A 09/24/2021   Procedure: COLONOSCOPY WITH  PROPOFOL;  Surgeon: Imogene Burn, MD;  Location: [facility] ENDOSCOPY;  Service: Gastroenterology;  Laterality: N/A;   ESOPHAGOGASTRODUODENOSCOPY (EGD) WITH PROPOFOL N/A 09/24/2021   Procedure: ESOPHAGOGASTRODUODENOSCOPY (EGD) WITH PROPOFOL;  Surgeon: Imogene Burn, MD;  Location: [facility] ENDOSCOPY;  Service: Gastroenterology;  Laterality: N/A;   ESOPHAGOGASTRODUODENOSCOPY (EGD) WITH PROPOFOL N/A 08/18/2022   Procedure: ESOPHAGOGASTRODUODENOSCOPY (EGD) WITH PROPOFOL;  Surgeon: Hilarie Fredrickson,  MD;  Location: [facility] ENDOSCOPY;  Service: Gastroenterology;  Laterality: N/A;   FLEXIBLE SIGMOIDOSCOPY N/A 08/18/2022   Procedure: FLEXIBLE SIGMOIDOSCOPY;  Surgeon: Hilarie Fredrickson, MD;  Location: [facility] ENDOSCOPY;  Service: Gastroenterology;  Laterality: N/A;   FRACTURE SURGERY     left ankle plate   HERNIA REPAIR     inguinal   KNEE SURGERY Right    x 4   SHOULDER SURGERY Bilateral    x 2   VASECTOMY     Family History:  Family History  Problem Relation Age of Onset   Pulmonary fibrosis Mother    Hypertension Father    Other Father        liver failure   Diabetes Brother    Breast cancer Paternal Aunt    Colon cancer Neg Hx    Esophageal cancer Neg Hx    Stomach cancer Neg Hx    Rectal cancer Neg Hx    Family Psychiatric  History: See H & P Social History:  Social History   Substance and Sexual Activity  Alcohol Use Not Currently     Social History   Substance and Sexual Activity  Drug Use Never    Social History   Socioeconomic History   Marital status: Married    Spouse name: [name]   Number of children: 2   Years of education: Not on file   Highest education level: 12th grade  Occupational History    Comment: disability  Tobacco Use   Smoking status: Never   Smokeless tobacco: Current    Types: Snuff  Vaping Use   Vaping status: Never Used  Substance and Sexual Activity   Alcohol use: Not Currently   Drug use: Never   Sexual activity: Yes    Partners: Female  Other Topics Concern   Not on file  Social History Narrative   Lives with wife   Social Determinants of Health   Financial Resource Strain: Medium Risk (01/09/2023)   Overall Financial Resource Strain (CARDIA)    Difficulty of Paying Living Expenses: Somewhat hard  Food Insecurity: No Food Insecurity (05/22/2023)   Hunger Vital Sign    Worried About Running Out of Food in the Last Year: Never true    Ran Out of Food in the Last Year: Never true  Transportation Needs: No Transportation Needs  (05/22/2023)   PRAPARE - Administrator, Civil Service (Medical): No    Lack of Transportation (Non-Medical): No  Physical Activity: Insufficiently Active (01/09/2023)   Exercise Vital Sign    Days of Exercise per Week: 1 day    Minutes of Exercise per Session: 10 min  Stress: Stress Concern Present (01/09/2023)   [employer] of Occupational Health - Occupational Stress Questionnaire    Feeling of Stress : To some extent  Social Connections: Moderately Integrated (01/09/2023)   Social Connection and Isolation Panel [NHANES]    Frequency of Communication with Friends and Family: More than three times a week    Frequency of Social Gatherings with Friends and Family: Once a week    Attends Religious Services: More  than 4 times per year    Active Member of Clubs or Organizations: No    Attends Engineer, structural: Not on file    Marital Status: Married   Sleep: Good  Appetite:  Fair  Current Medications: Current Facility-Administered Medications  Medication Dose Route Frequency Provider Last Rate Last Admin   acetaminophen (TYLENOL) tablet 650 mg  650 mg Oral Q6H PRN Dahlia Byes C, NP   650 mg at 05/25/23 0450   alum & mag hydroxide-simeth (MAALOX/MYLANTA) 200-200-20 MG/5ML suspension 30 mL  30 mL Oral Q4H PRN Dahlia Byes C, NP       ARIPiprazole (ABILIFY) tablet 5 mg  5 mg Oral Daily Starleen Blue, NP   5 mg at 05/25/23 5284   diclofenac Sodium (VOLTAREN) 1 % topical gel 2 g  2 g Topical QID PRN Starleen Blue, NP       diphenhydrAMINE (BENADRYL) capsule 50 mg  50 mg Oral TID PRN Earney Navy, NP       Or   diphenhydrAMINE (BENADRYL) injection 50 mg  50 mg Intramuscular TID PRN Earney Navy, NP       folic acid (FOLVITE) tablet 1 mg  1 mg Oral Daily Dahlia Byes C, NP   1 mg at 05/25/23 1324   gabapentin (NEURONTIN) capsule 200 mg  200 mg Oral TID Starleen Blue, NP   200 mg at 05/25/23 4010   hydrOXYzine (ATARAX) tablet 25 mg   25 mg Oral Q6H PRN Starleen Blue, NP   25 mg at 05/25/23 0037   lactulose (CHRONULAC) 10 GM/15ML solution 10 g  10 g Oral Daily PRN Dahlia Byes C, NP       loperamide (IMODIUM) capsule 2-4 mg  2-4 mg Oral PRN Inger Wiest, NP       LORazepam (ATIVAN) tablet 2 mg  2 mg Oral TID PRN Dahlia Byes C, NP       Or   LORazepam (ATIVAN) injection 2 mg  2 mg Intramuscular TID PRN Dahlia Byes C, NP       LORazepam (ATIVAN) tablet 1 mg  1 mg Oral BID Starleen Blue, NP   1 mg at 05/25/23 0840   Followed by   Melene Muller ON 05/26/2023] LORazepam (ATIVAN) tablet 1 mg  1 mg Oral Daily Marialuisa Basara, NP       multivitamin with minerals tablet 1 tablet  1 tablet Oral Daily Onuoha, Josephine C, NP   1 tablet at 05/25/23 0841   nicotine (NICODERM CQ - dosed in mg/24 hours) patch 21 mg  21 mg Transdermal Daily Mekai Wilkinson, NP   21 mg at 05/25/23 0841   pantoprazole (PROTONIX) EC tablet 40 mg  40 mg Oral Daily Onuoha, Josephine C, NP   40 mg at 05/25/23 2725   thiamine (Vitamin B-1) tablet 100 mg  100 mg Oral Daily Onuoha, Josephine C, NP   100 mg at 05/25/23 3664   Or   thiamine (VITAMIN B1) injection 100 mg  100 mg Intravenous Daily Dahlia Byes C, NP        Lab Results: No results found for this or any previous visit (from the past 48 hour(s)).  Blood Alcohol level:  Lab Results  Component Value Date   ETH [facility]) 05/22/2023   ETH <10 01/19/2023    Metabolic Disorder Labs: Lab Results  Component Value Date   HGBA1C 5.1 04/08/2021   MPG 91.06 08/14/2020   Lab Results  Component Value Date   PROLACTIN  19.0 (H) 01/12/2023   Lab Results  Component Value Date   CHOL 130 05/12/2023   TRIG 84 05/12/2023   HDL 29 (L) 05/12/2023   CHOLHDL 5.2 (H) 04/09/2022   LDLCALC 84 05/12/2023   LDLCALC 123 (H) 04/09/2022    Physical Findings: AIMS:  , ,  ,  ,    CIWA:  CIWA-Ar Total: 6 COWS:     Musculoskeletal: Strength & Muscle Tone: within normal limits Gait & Station:  unsteady-Using wheelchair here at [facility] Patient leans: N/A  Psychiatric Specialty Exam:  Presentation  General Appearance:  Casual; Fairly Groomed  Eye Contact: Good  Speech: Clear and Coherent  Speech Volume: Normal  Handedness: Right   Mood and Affect  Mood: Depressed  Affect: Congruent   Thought Process  Thought Processes: Coherent  Descriptions of Associations:Intact  Orientation:Full (Time, Place and Person)  Thought Content:Logical  History of Schizophrenia/Schizoaffective disorder:No  Duration of Psychotic Symptoms:No data recorded Hallucinations:Hallucinations: None  Ideas of Reference:None  Suicidal Thoughts:Suicidal Thoughts: No  Homicidal Thoughts:Homicidal Thoughts: No   Sensorium  Memory: Recent Poor  Judgment: Poor  Insight: Poor  Executive Functions  Concentration: Fair  Attention Span: Fair  Recall: Fair  Fund of Knowledge: Fair  Language: Good   Psychomotor Activity  Psychomotor Activity:Psychomotor Activity: Other (comment); Decreased (B/L LE weakness, using wheelchair here on unit)   Assets  Assets: Housing; Social Support   Sleep  Sleep:Sleep: Fair    Physical Exam: Physical Exam HENT:     Nose: Nose normal.  Pulmonary:     Effort: Pulmonary effort is normal.  Musculoskeletal:        General: Swelling present.     Right lower leg: Edema present.     Left lower leg: Edema present.  Neurological:     Mental Status: He is alert and oriented to person, place, and time.     Motor: Weakness present.     Coordination: Coordination abnormal.    Review of Systems  Constitutional: Negative.   HENT: Negative.    Eyes: Negative.   Respiratory: Negative.    Cardiovascular: Negative.   Gastrointestinal: Negative.   Genitourinary:  Positive for dysuria, frequency and urgency.  Musculoskeletal:  Positive for falls (Fall risk-Falls prior to admission), joint pain and myalgias. Negative for back pain  and neck pain.  Skin: Negative.   Neurological:  Negative for dizziness.  Psychiatric/Behavioral:  Positive for depression (Denies SI/HI, denies plan or intent) and substance abuse. Negative for hallucinations, memory loss and suicidal ideas. The patient is nervous/anxious and has insomnia.    Blood pressure 120/78, pulse 75, temperature 98.8 F (37.1 C), temperature source Oral, resp. rate 18, height 5\' 5"  (1.651 m), weight 77.4 kg, SpO2 95%. Body mass index is 28.39 kg/m.   Treatment Plan Summary: Treatment Plan Summary: Daily contact with patient to assess and evaluate symptoms and progress in treatment and Medication management   Safety and Monitoring: Voluntary admission to inpatient psychiatric unit for safety, stabilization and treatment Daily contact with patient to assess and evaluate symptoms and progress in treatment Patient's case to be discussed in multi-disciplinary team meeting Observation Level : 1 on 1 monitoring for safety-high fall risk, may use own rollator while on one-on-one monitoring Vital signs: q12 hours Precautions: Safety   Long Term Goal(s): Improvement in symptoms so as ready for discharge   Short Term Goals: Ability to identify changes in lifestyle to reduce recurrence of condition will improve, Ability to verbalize feelings will improve, Ability to disclose  and discuss suicidal ideas, Ability to demonstrate self-control will improve, Ability to identify and develop effective coping behaviors will improve, Ability to maintain clinical measurements within normal limits will improve, Compliance with prescribed medications will improve, and Ability to identify triggers associated with substance abuse/mental health issues will improve   Diagnoses Principal Problem:   Bipolar 2 disorder, major depressive episode (HCC) Active Problems:   GAD (generalized anxiety disorder)   PTSD (post-traumatic stress disorder)   Tobacco chew use   Alcohol use disorder    Arthritis   Medications -Continue gabapentin 200 mg 3 times daily for neuropathy, alcohol use disorder, GAD. -Discontinued Trintellix on admission as patient states it is not effective -Discontinued BuSpar on admission as patient states it is not effective -Continue Abilify 5 mg daily for mood stabilization -Hold Lexapro 5 mg started on admission (recent mania) -Continue Ativan detox protocol for alcohol use-please see tomorrow for complete order -Continue Protonix 40 mg daily for GERD -Discontinued trazodone on admission due to complaints of vivid dreams -Continue nicotine patch 21 mg for tobacco use   Other PRNS -Continue Tylenol 650 mg every 6 hours PRN for mild pain -Continue Maalox 30 mg every 4 hrs PRN for indigestion -Continue Milk of Magnesia as needed every 6 hrs for constipation   Discharge Planning: Social work and case management to assist with discharge planning and identification of hospital follow-up needs prior to discharge Estimated LOS: 5-7 days Discharge Concerns: Need to establish a safety plan; Medication compliance and effectiveness Discharge Goals: Return home with outpatient referrals for mental health follow-up including medication management/psychotherapy   I certify that inpatient services furnished can reasonably be expected to improve the patient's condition.    Starleen Blue, NP 05/25/2023, 9:05 AM

## 2023-05-25 NOTE — Progress Notes (Signed)
1:1 Nursing Note: Patient is weak, unable to stand, pivot or walk. Patient is a 2-person heavy assist transfer using gait belt.  Wearing a diaper. Continues 1:1 for safety.

## 2023-05-25 NOTE — ED Provider Notes (Addendum)
[place] EMERGENCY DEPARTMENT AT [facility] Provider Note   CSN: [id number] Arrival date & time: 05/25/23  4098     History  Chief Complaint  Patient presents with   Emesis    Chaynce Schafer is a 57 y.o. male.  Patient is a 57 year old male with a history of mental illness, currently admitted to behavioral health for suicidal ideation and almost shooting himself in the head, alcohol abuse, cirrhosis, hypertension who is presenting today from behavioral health Hospital due to an episode of vomiting and decreased responsiveness.  Patient reports that he thinks he felt pretty well yesterday but this morning he has felt dizzy, nauseated and had an episode of vomiting.  They report that after he had the episode of vomiting he became very sweaty and was not as responsive.  Here patient denies any further vomiting.  He denies any abdominal pain, chest pain or shortness of breath.  The sitter with him reports he has been very weak this morning but based on notes from behavioral health Hospital patient has been using a wheelchair and has had one-on-one monitoring due to being a fall risk since he is arrived there.  Patient reports his bowel movements have been normal and he was eating and drinking yesterday.  He does also report being on multiple medications since going to behavioral health Hospital that he thinks are new.  He denies any vision changes and reports he feels dizzy even sitting there.  Seems like he is felt this way since waking up this morning.  The history is provided by the patient.  Emesis      Home Medications Prior to Admission medications   Medication Sig Start Date End Date Taking? Authorizing Provider  busPIRone (BUSPAR) 15 MG tablet Take 1 tablet (15 mg total) by mouth 2 (two) times daily. 11/18/22   Everrett Coombe, DO  diazepam (VALIUM) 10 MG tablet Take 1 tablet by mouth as directed; (take 1 tablet by mouth 2 hrs before appointment and bring the rest with  you) 05/20/23     diclofenac Sodium (VOLTAREN) 1 % GEL Apply 4 grams topically 4 (four) times daily to affected joint. Patient not taking: Reported on 05/22/2023 02/08/23   Monica Becton, MD  erythromycin ophthalmic ointment Apply a 1 centimeter ribbon into the conjunctival sac(s) in affected eye(s) 2 times daily. Patient not taking: Reported on 05/22/2023 05/12/23     furosemide (LASIX) 40 MG tablet Take 1 tablet (40 mg) by mouth daily. Patient taking differently: Take 40 mg by mouth as needed for edema. 01/20/23 05/22/23  Rai, Delene Ruffini, MD  hydrOXYzine (ATARAX) 25 MG tablet Take 0.5-1 tablets (12.5-25 mg total) by mouth every 8 (eight) hours as needed for itching. 10/19/22   Everrett Coombe, DO  lactulose, encephalopathy, (CHRONULAC) 10 GM/15ML SOLN Take 10 g by mouth daily as needed (constipation).    [provider]  magnesium oxide (MAG-OX) 400 MG tablet Take 1 tablet (400 mg total) by mouth 2 (two) times daily. 05/13/23   Everrett Coombe, DO  ondansetron (ZOFRAN-ODT) 8 MG disintegrating tablet Dissolve 1 tablet (8 mg total) by mouth every 8 (eight) hours as needed for nausea. 01/20/23   Rai, Ripudeep K, MD  pantoprazole (PROTONIX) 40 MG tablet Take 1 tablet (40 mg total) by mouth daily. Patient not taking: Reported on 05/22/2023 11/18/22 11/18/23  Imogene Burn, MD  potassium chloride SA (KLOR-CON M) 20 MEQ tablet Take 1 tablet (20 mEq total) by mouth 2 (two) times daily.  05/13/23 06/12/23  Everrett Coombe, DO  saccharomyces boulardii (FLORASTOR) 250 MG capsule Take 250 mg by mouth 2 (two) times daily.     [provider]  traZODone (DESYREL) 50 MG tablet Take 1-2 tablets (50-100 mg total) by mouth at bedtime as needed for sleep. 05/18/23   Everrett Coombe, DO  UNKNOWN TO PATIENT Apply 1 patch topically at bedtime. Sweet Dreams patch    [provider]  vortioxetine HBr (TRINTELLIX) 10 MG TABS tablet Take 1 tablet (10 mg total) by mouth daily. Patient not taking: Reported  on 05/22/2023 11/22/22   Everrett Coombe, DO      Allergies    Apple juice, Cucumber extract, Depakote er [divalproex sodium er], Depakote [valproic acid], Peanut butter flavor, Peanut oil, Peanut-containing drug products, Shellfish allergy, Shrimp extract, Strawberry extract, Apple, Cantaloupe extract allergy skin test, Codeine, Firvanq [vancomycin], and Lactose intolerance (gi)    Review of Systems   Review of Systems  Gastrointestinal:  Positive for vomiting.    Physical Exam Updated Vital Signs BP (!) 154/91 (BP Location: Right Arm)   Pulse 87   Temp 98.6 F (37 C) (Oral)   Resp 17   Ht 5\' 5"  (1.651 m)   Wt 77.4 kg   SpO2 96%   BMI 28.39 kg/m  Physical Exam Vitals and nursing note reviewed.  Constitutional:      General: He is not in acute distress.    Appearance: He is well-developed. He is ill-appearing.  HENT:     Head: Normocephalic and atraumatic.  Eyes:     Conjunctiva/sclera: Conjunctivae normal.     Comments: Pupils are 4 mm bilaterally and sluggishly reactive  Cardiovascular:     Rate and Rhythm: Normal rate and regular rhythm.     Heart sounds: Murmur heard.  Pulmonary:     Effort: Pulmonary effort is normal. No respiratory distress.     Breath sounds: Normal breath sounds. No wheezing or rales.  Abdominal:     General: There is no distension.     Palpations: Abdomen is soft.     Tenderness: There is no abdominal tenderness. There is no guarding or rebound.  Musculoskeletal:        General: No tenderness. Normal range of motion.     Cervical back: Normal range of motion and neck supple.     Right lower leg: Edema present.     Left lower leg: Edema present.  Skin:    General: Skin is warm and dry.     Coloration: Skin is pale.     Findings: No erythema or rash.  Neurological:     Mental Status: He is alert and oriented to person, place, and time.     Comments: Patient can do finger-to-nose testing bilaterally but is very slow.  When he does heel-to-shin  he is able to do it but legs are very shaky.  4 out of 5 strength in upper and lower extremities.  No pronator drift noted.  No definitive asterixis.  Psychiatric:        Behavior: Behavior normal.     ED Results / Procedures / Treatments   Labs (all labs ordered are listed, but only abnormal results are displayed) Labs Reviewed  CBC WITH DIFFERENTIAL/PLATELET - Abnormal; Notable for the following components:      Result Value   RBC 3.63 (*)    Hemoglobin 9.2 (*)    HCT 29.8 (*)    MCH 25.3 (*)    RDW 23.8 (*)  Platelets 60 (*)    All other components within normal limits  COMPREHENSIVE METABOLIC PANEL - Abnormal; Notable for the following components:   Potassium 3.0 (*)    Glucose, Bld 111 (*)    Calcium 8.6 (*)    Total Protein 6.4 (*)    Albumin 2.8 (*)    AST 63 (*)    Total Bilirubin 3.9 (*)    All other components within normal limits  AMMONIA - Abnormal; Notable for the following components:   Ammonia 64 (*)    All other components within normal limits  CBG MONITORING, ED - Abnormal; Notable for the following components:   Glucose-Capillary 104 (*)    All other components within normal limits  CBG MONITORING, ED - Abnormal; Notable for the following components:   Glucose-Capillary 106 (*)    All other components within normal limits  URINALYSIS, W/ REFLEX TO CULTURE (INFECTION SUSPECTED)  TROPONIN I (HIGH SENSITIVITY)  TROPONIN I (HIGH SENSITIVITY)    EKG EKG Interpretation Date/Time:  Wednesday May 25 2023 10:38:41 EDT Ventricular Rate:  72 PR Interval:  161 QRS Duration:  108 QT Interval:  472 QTC Calculation: 517 R Axis:   51  Text Interpretation: Sinus rhythm Prolonged QT interval Confirmed by Bethann Berkshire [phone]) on 05/25/2023 10:42:24 AM  Radiology CT Head Wo Contrast  Result Date: 05/25/2023 CLINICAL DATA:  Mental status change, unknown cause. EXAM: CT HEAD WITHOUT CONTRAST TECHNIQUE: Contiguous axial images were obtained from the base of  the skull through the vertex without intravenous contrast. RADIATION DOSE REDUCTION: This exam was performed according to the departmental dose-optimization program which includes automated exposure control, adjustment of the mA and/or kV according to patient size and/or use of iterative reconstruction technique. COMPARISON:  Head CT 04/03/2023 FINDINGS: Brain: There is no evidence of an acute infarct, intracranial hemorrhage, mass, midline shift, or extra-axial fluid collection. Age advanced brain atrophy most notably affecting the cerebellum is unchanged. Vascular: No hyperdense vessel. Skull: No acute fracture or suspicious osseous lesion. Sinuses/Orbits: Visualized paranasal sinuses and mastoid air cells are clear. Unremarkable orbits. Other: None. IMPRESSION: No evidence of acute intracranial abnormality. Electronically Signed   By: Sebastian Ache M.D.   On: 05/25/2023 13:41    Procedures Procedures    Medications Ordered in ED Medications  lactulose (CHRONULAC) 10 GM/15ML solution 10 g (has no administration in time range)  pantoprazole (PROTONIX) EC tablet 40 mg (40 mg Oral Given 05/25/23 0838)  acetaminophen (TYLENOL) tablet 650 mg (650 mg Oral Given 05/25/23 0450)  alum & mag hydroxide-simeth (MAALOX/MYLANTA) 200-200-20 MG/5ML suspension 30 mL (has no administration in time range)  LORazepam (ATIVAN) tablet 2 mg (has no administration in time range)    Or  LORazepam (ATIVAN) injection 2 mg (has no administration in time range)  diphenhydrAMINE (BENADRYL) capsule 50 mg (has no administration in time range)    Or  diphenhydrAMINE (BENADRYL) injection 50 mg (has no administration in time range)  thiamine (VITAMIN B1) tablet 100 mg (100 mg Oral Given 05/25/23 0839)    Or  thiamine (VITAMIN B1) injection 100 mg ( Intravenous See Alternative 05/25/23 0839)  folic acid (FOLVITE) tablet 1 mg (1 mg Oral Given 05/25/23 6045)  multivitamin with minerals tablet 1 tablet (1 tablet Oral Given 05/25/23 0841)   loperamide (IMODIUM) capsule 2-4 mg (has no administration in time range)  LORazepam (ATIVAN) tablet 1 mg (1 mg Oral Given 05/23/23 2147)    Followed by  LORazepam (ATIVAN) tablet 1 mg (1 mg Oral  Given 05/24/23 1745)    Followed by  LORazepam (ATIVAN) tablet 1 mg (1 mg Oral Given 05/25/23 0840)    Followed by  LORazepam (ATIVAN) tablet 1 mg (has no administration in time range)  hydrOXYzine (ATARAX) tablet 25 mg (25 mg Oral Given 05/25/23 0037)  ARIPiprazole (ABILIFY) tablet 5 mg (5 mg Oral Given 05/25/23 0838)  gabapentin (NEURONTIN) capsule 200 mg (200 mg Oral Given 05/25/23 1418)  nicotine (NICODERM CQ - dosed in mg/24 hours) patch 21 mg (21 mg Transdermal Patch Applied 05/25/23 0841)  diclofenac Sodium (VOLTAREN) 1 % topical gel 2 g (has no administration in time range)  lactated ringers bolus 250 mL (0 mLs Intravenous Stopped 05/25/23 1207)  metoCLOPramide (REGLAN) injection 10 mg (10 mg Intravenous Given 05/25/23 1053)  potassium chloride SA (KLOR-CON M) CR tablet 40 mEq (40 mEq Oral Given 05/25/23 1510)    ED Course/ Medical Decision Making/ A&P Clinical Course as of 05/25/23 1634  Wed May 25, 2023  1615 Patient at behavioral health, sent for vomiting, dizziness, hx liver disease, hypokalemia, better with fluids, under IVC for SI, up for transfer back to [facility] [JD]    Clinical Course User Index [JD] Laurence Spates, MD                                 Medical Decision Making Amount and/or Complexity of Data Reviewed External Data Reviewed: notes. Labs: ordered. Decision-making details documented in ED Course. Radiology: ordered and independent interpretation performed. Decision-making details documented in ED Course. ECG/medicine tests: ordered and independent interpretation performed. Decision-making details documented in ED Course.  Risk Prescription drug management.   Pt with multiple medical problems and comorbidities and presenting today with a complaint that caries a high  risk for morbidity and mortality.  Here today with multiple vague complaints.  Concern for complications of cirrhosis such as GI bleed resulting in symptomatic anemia versus hyperammonemia.  Lower suspicion for bowel perforation or obstruction as patient is having no abdominal pain.  Possibility for peripheral vertigo but lower suspicion for central vertigo.  Patient has no noted nystagmus at this time.  Also possibility for alcohol withdrawal however patient is not tachycardic, hypertensive or particularly tremulous.  Patient given antiemetics and a small fluid bolus.  Labs and imaging are pending.  4:05 PM I independently interpreted patient's labs and EKG.  EKG today without acute findings, troponins x 2 are negative today, ammonia is mildly elevated at 64 but no obvious signs of asterixis or hepatic encephalopathy, CMP with hypokalemia of 3.0 but normal renal function, total bilirubin of 3.9 which seems to be typical within patient's range with his known cirrhosis, CBC with stable hemoglobin of 9.2 today and normal white count.  Patient has thrombocytopenia with a platelet count of 60 which is unchanged from prior.  After a small bolus of fluid patient's blood pressure has improved.  He overall feels much better.  He denies any dizziness at this time.  I have independently visualized and interpreted pt's images today.  Head CT is negative for acute process.  On reevaluation patient feels much better.  Will p.o. challenge the patient to ensure he has no further vomiting or feeling unwell but otherwise feel that he is medically cleared to return to behavioral health Hospital.  He was given a dose of potassium.  4:34 PM Now [facility] is stating they will not take him back and he needs to go to  [facility] and needs new consult         Final Clinical Impression(s) / ED Diagnoses Final diagnoses:  Dehydration  Hypokalemia    Rx / DC Orders ED Discharge Orders     None         Gwyneth Sprout,  MD 05/25/23 1605    Gwyneth Sprout, MD 05/25/23 1635

## 2023-05-25 NOTE — Progress Notes (Signed)
   05/25/23 0800  Psych Admission Type (Psych Patients Only)  Admission Status Involuntary  Psychosocial Assessment  Patient Complaints None  Eye Contact Fair  Facial Expression Animated  Affect Depressed  Speech Logical/coherent  Interaction Assertive  Motor Activity Unsteady;Tremors  Appearance/Hygiene In scrubs  Behavior Characteristics Cooperative  Mood Depressed  Thought Process  Coherency WDL  Content WDL  Delusions None reported or observed  Perception WDL  Hallucination None reported or observed  Judgment Impaired  Confusion None  Danger to Self  Current suicidal ideation? Denies  Agreement Not to Harm Self Yes  Description of Agreement Verbal  Danger to Others  Danger to Others None reported or observed

## 2023-05-25 NOTE — Plan of Care (Signed)
  Problem: Education: Goal: Knowledge of [place] [facility] Education information/materials will improve Outcome: Progressing Goal: Emotional status will improve Outcome: Progressing Goal: Mental status will improve Outcome: Progressing Goal: Verbalization of understanding the information provided will improve Outcome: Progressing   Problem: Activity: Goal: Interest or engagement in activities will improve Outcome: Not Progressing Goal: Sleeping patterns will improve Outcome: Not Progressing

## 2023-05-25 NOTE — Plan of Care (Signed)
  Problem: Education: Goal: Emotional status will improve Outcome: Progressing   Problem: Education: Goal: Mental status will improve Outcome: Progressing   Problem: Safety: Goal: Periods of time without injury will increase Outcome: Progressing   Problem: Self-Concept: Goal: Will verbalize positive feelings about self Outcome: Progressing Note: Patient is on track. Patient will maintain adherence

## 2023-05-26 DIAGNOSIS — F3181 Bipolar II disorder: Secondary | ICD-10-CM | POA: Diagnosis not present

## 2023-05-26 LAB — ETHANOL: Alcohol, Ethyl (B): <10 mg/dL (ref <10)

## 2023-05-26 MED ORDER — OLANZAPINE 5 MG PO TBDP: 2.5000 mg | ORAL_TABLET | Freq: Every day | ORAL | Status: DC

## 2023-05-26 MED ORDER — ARIPIPRAZOLE 5 MG PO TABS: 5.0000 mg | ORAL_TABLET | Freq: Every day | ORAL | Status: DC

## 2023-05-26 NOTE — Progress Notes (Addendum)
ADDENDUM  10:34 AM - CSW spoke with Vernona Rieger, Clinical cytogeneticist, at [facility] via phone call. Per Vernona Rieger, given pt's medical issues: incontinence and difficulty with ambulation and ADLs, pt is not appropriate for inpatient psych treatment.  8:31 AM - CSW attempted to speak with April, transfer coordinator, at [facility]. CSW was unable to reach April, but left a voicemail requesting return phone call to discuss pt. Per chart review, pt is incontinent and uses walker for ambulation. CSW to follow up with April to discuss criteria for inpatient psych treatment at [facility].
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
## 2023-05-26 NOTE — ED Provider Notes (Signed)
Emergency Medicine Observation Re-evaluation Note  Juan [name] is a 57 y.o. male, seen on rounds today.  Pt initially presented to the ED for complaints of Emesis Currently, the patient is asleep.  Physical Exam  BP 122/79   Pulse 83   Temp 98.2 F (36.8 C) (Oral)   Resp 18   Ht 5\' 5"  (1.651 m)   Wt 77.4 kg   SpO2 96%   BMI 28.39 kg/m  Physical Exam General: asleep Cardiac: asleep Lungs: asleep Psych: asleep  ED Course / MDM  EKG:EKG Interpretation Date/Time:  Wednesday May 25 2023 10:38:41 EDT Ventricular Rate:  72 PR Interval:  161 QRS Duration:  108 QT Interval:  472 QTC Calculation: 517 R Axis:   51  Text Interpretation: Sinus rhythm Prolonged QT interval Confirmed by Bethann Berkshire [phone]) on 05/25/2023 10:42:24 AM  I have reviewed the labs performed to date as well as medications administered while in observation.  Recent changes in the last 24 hours include psychiatric and home meds, in addition to po potassium.  Plan  Current plan is for inpatient psychiatric admission.    Pricilla Loveless, MD 05/26/23 [phone]

## 2023-05-26 NOTE — Progress Notes (Signed)
LCSW Progress Note  [id number]   Juan [name]  05/26/2023  12:46 AM    [facility]o-Inpatient Behavioral Health Placement  Pt meets inpatient criteria per Harlene Salts, NP-Telepsychiat[facility] Consult Services. There are no available beds within [facility] system per [facility] AC Kim Brooks,RN .   -Pt has VA benefits and CSW sent to [facility] system. First shift CSW to follow up.  Referral was sent to the following facilities;   Destination  Service Provider Address Phone Fax  [facility] (after hours)  [address], [place] [address] [phone]  [facility]  [address], [place] [address] [phone]  [facility]  [address] [address] [phone]  [facility]  [place] [phone]  [facility]  [address], [place] [address] [phone]  [facility]  [place] [phone]  [facility]  [address], [place] [address] [phone]    Situation ongoing,  CSW will follow up.    Maryjean Ka, MSW, LCSWA 05/26/2023 12:46 AM
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
## 2023-05-27 LAB — BASIC METABOLIC PANEL
Anion gap: 7 (ref 5–15)
BUN: 11 mg/dL (ref 6–20)
CO2: 21 mmol/L — ABNORMAL LOW (ref 22–32)
Calcium: 8.2 mg/dL — ABNORMAL LOW (ref 8.9–10.3)
Chloride: 105 mmol/L (ref 98–111)
Creatinine, Ser: 0.78 mg/dL (ref 0.61–1.24)
GFR, Estimated: >60 mL/min (ref >60)
Glucose, Bld: 118 mg/dL — ABNORMAL HIGH (ref 70–99)
Potassium: 3.1 mmol/L — ABNORMAL LOW (ref 3.5–5.1)
Sodium: 133 mmol/L — ABNORMAL LOW (ref 135–145)

## 2023-05-27 LAB — CK: Total CK: 66 U/L (ref 49–397)

## 2023-05-27 LAB — AMMONIA: Ammonia: 76 umol/L — ABNORMAL HIGH (ref 9–35)

## 2023-05-27 NOTE — ED Notes (Signed)
Inquiries received from [facility] and [facility] related  placement for Mr Milosevic . Both facilities have decline his admission at this time due his disabilities.
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
## 2023-05-27 NOTE — ED Provider Notes (Signed)
Emergency Medicine Observation Re-evaluation Note  Juan [name] is a 57 y.o. male, seen on rounds today.  Pt initially presented to the ED for complaints of Emesis Currently, the patient is waiting placement for suicidal ideation with plan.  Patient was getting physical therapy treatment and assessment as I evaluated him.  Physical Exam  BP 121/77 (BP Location: Right Arm)   Pulse 81   Temp 98.2 F (36.8 C) (Oral)   Resp 16   Ht 5\' 5"  (1.651 m)   Wt 77.4 kg   SpO2 98%   BMI 28.39 kg/m  Physical Exam General: Patient is awake and alert.  He is in no distress.  He is significantly physically debilitated for a 57 year old without obesity or apparent significant general deconditioning Cardiac: Regular no gross rub murmur gallop Lungs: Clear to auscultation no respiratory distress. Psych: Patient is calm and interactive.  He does have some recall as to the events precipitating his evaluation in the emergency department for self-harm. Neurologic: Patient was ambulated by PT.  He required a walking roller and a gait belt.  Patient has a extremely halting gait.  Gait is broad-based and he is highly dependent on walker for support.  In a seated position patient will perform upper extremity strength testing which is is symmetric.  He does not appear to have significant upper extremity deficit.  No asterixis.  Patient's mental status is alert and verbally communicative.  He has some insight into why he is at the hospital and can give me some historical factors around his medical care but he does seem cognitively delayed.  He is not somnolent.  Does not have tremor.  ED Course / MDM  EKG:EKG Interpretation Date/Time:  Wednesday May 25 2023 10:38:41 EDT Ventricular Rate:  72 PR Interval:  161 QRS Duration:  108 QT Interval:  472 QTC Calculation: 517 R Axis:   51  Text Interpretation: Sinus rhythm Prolonged QT interval Confirmed by Bethann Berkshire [phone]) on 05/25/2023 10:42:24 AM  I have  reviewed the labs performed to date as well as medications administered while in observation.  Recent changes in the last 24 hours include none.  Still awaiting placement.  Plan  Current plan is for psychiatric treatment for suicidal ideation with plan.  Extensively reviewed the patient's EMR and neurology evaluations.  He has a diagnosis of alcoholic myopathy with proximal muscle weakness that started approximately 2 years ago.  Reports he was working normal job up until approximately 2 years ago at which point time he became aware that his incoordination and weakness was making work and possible.  Since that time he has progressed to being dependent on a walker and a scooter.  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  Patient has been getting Thiamine treatment.  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  Patient is eating and drinking and functioning at what is now his current baseline.  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    Arby Barrette, MD 05/27/23 1339

## 2023-05-27 NOTE — Progress Notes (Signed)
Physical Therapy Treatment Patient Details Name: Juan [name] MRN: [id number] DOB: 1965/10/28 Today's Date: 05/27/2023   History of Present Illness 57 y.o. male admitted to [facility] for suicide ideation. Patient was IVC to [facility]. Hx of chronic pain, repeated falls, MMD, Alcohol Myopathy, COVID 2021, chronic fatigue, cirrhosis, ETOH use d/o, PTSD, anxiety.    PT Comments  Pt seen in ED Easily arouse.  General Comments: AxO x 2 pleasant/following commands but inconsistant and easily distracted.  Required repeat cueing to stay on task.  Aware he is in the hospital, unaware day/time.  I asked him when was the last time he drove and he replied "this morning". Assisted OOB was difficult.  General bed mobility comments: pt required increased asisst this session to transfer to EOB.  Very slow moving.  Rigid.  Tremors. General transfer comment: pt required Mod Assist to rise from EOB on to walker.  Max VC's on proper hand placement "handle bars".  [name].  Poor initiation.  Poor forward flexed posture.  Unable to achieve upright stance.  Posterior lean as well as "freezing".  Assisted on/off toilet, pt presents with uncontrolled sit and then he was unable to self rise from lower toilet level.  Required Max Assist to get up from toilet and assist to pull up under pants as pt stood "frozen". General Gait Details: VERY slow, staggered, unsteady steps with poor forward flexed posture and B hips/knees flexed.  Poor initiation and delayed reaction.  Pt does have a HX MDD and freq falls. Past Therapy notes state, pt does amb with a Rollator and Spouse assists with "shoes and sock".  Pt was evaluated on 05/24/23 by LPT with rec for SNF.  Pt would also benefit from a Neurology consult as well as LAB draws for Potasium and Magnesium levels given his current mobility difficulty.    If plan is discharge home, recommend the following: Assistance with cooking/housework;Assist for transportation;Direct supervision/assist  for financial management;Direct supervision/assist for medications management;Help with stairs or ramp for entrance;A lot of help with walking and/or transfers;A lot of help with bathing/dressing/bathroom   Can travel by private vehicle     Yes  Equipment Recommendations  None recommended by PT    Recommendations for Other Services       Precautions / Restrictions Precautions Precautions: Fall Precaution Comments: high fall risk; incontinent, Restrictions Weight Bearing Restrictions: No     Mobility  Bed Mobility Overal bed mobility: Needs Assistance Bed Mobility: Supine to Sit, Sit to Supine   Sidelying to sit: Min assist   Sit to supine: Min assist   General bed mobility comments: pt required increased asisst this session to transfer to EOB.  Very slow moving.  Rigid.  Tremors.    Transfers Overall transfer level: Needs assistance Equipment used: Rolling walker (2 wheels) Transfers: Sit to/from Stand Sit to Stand: Mod assist           General transfer comment: pt required Mod Assist to rise from EOB on to walker.  Max VC's on proper hand placement "handle bars".  [name].  Poor initiation.  Poor forward flexed posture.  Unable to achieve upright stance.  Posterior lean as well as "freezing".  Assisted on/off toilet, pt presents with uncontrolled sit and then he was unable to self rise from lower toilet level.  Required Max Assist to get up from toilet and assist to pull up under pants as pt stood "frozen".    Ambulation/Gait Ambulation/Gait assistance: Min assist, Mod assist Gait Distance (Feet):  24 Feet Assistive device: Rolling walker (2 wheels) Gait Pattern/deviations: Step-to pattern, Decreased dorsiflexion - left, Knee flexed in stance - left, Trunk flexed Gait velocity: decreased     General Gait Details: VERY slow, staggered, unsteady steps with poor forward flexed posture and b hips/knees flexed.  Poor initiation and delayed reaction.  Pt does have  a HX MDD and freq falls.   Stairs             Wheelchair Mobility     Tilt Bed    Modified Rankin (Stroke Patients Only)       Balance                                            Cognition Arousal: Alert Behavior During Therapy: Flat affect Overall Cognitive Status: No family/caregiver present to determine baseline cognitive functioning                                 General Comments: AxO x 2 pleasant/following commands but inconsistant and easily distracted.  Required repeat cueing to stay on task.  Aware he is in the hospital, unaware day/time.  I asked him when was the last time he drove and he replied "this morning".        Exercises      General Comments        Pertinent Vitals/Pain Pain Assessment Pain Assessment: Faces Faces Pain Scale: Hurts a little bit Pain Location: general Pain Descriptors / Indicators: Tightness, Aching Pain Intervention(s): Monitored during session    Home Living                          Prior Function            PT Goals (current goals can now be found in the care plan section) Progress towards PT goals: Progressing toward goals    Frequency    Min 1X/week      PT Plan      Co-evaluation              AM-PAC PT "6 Clicks" Mobility   Outcome Measure  Help needed turning from your back to your side while in a flat bed without using bedrails?: A Lot Help needed moving from lying on your back to sitting on the side of a flat bed without using bedrails?: A Lot Help needed moving to and from a bed to a chair (including a wheelchair)?: A Lot Help needed standing up from a chair using your arms (e.g., wheelchair or bedside chair)?: A Lot Help needed to walk in hospital room?: A Lot Help needed climbing 3-5 steps with a railing? : Total 6 Click Score: 11    End of Session Equipment Utilized During Treatment: Gait belt Activity Tolerance: Patient limited by  fatigue Patient left: in bed;with call bell/phone within reach;with nursing/sitter in room Nurse Communication: Mobility status PT Visit Diagnosis: History of falling (Z91.81);Repeated falls (R29.6);Muscle weakness (generalized) (M62.81);Difficulty in walking, not elsewhere classified (R26.2);Other symptoms and signs involving the nervous system (R29.898)     Time: 1030-1110 PT Time Calculation (min) (ACUTE ONLY): 40 min  Charges:    $Gait Training: 8-22 mins $Therapeutic Activity: 23-37 mins PT General Charges $$ ACUTE PT VISIT: 1 Visit  Felecia Shelling  PTA Acute  Rehabilitation Services Office M-F          [phone]

## 2023-05-27 NOTE — Discharge Instructions (Addendum)
  Discharge recommendations:  Patient is to take medications as prescribed. Please see information for follow-up appointment with psychiatry and therapy. Please follow up with your primary care provider for all medical related needs.   Therapy: We recommend that patient participate in individual therapy to address mental health concerns.  Medications: The patient or guardian is to contact a medical professional and/or outpatient provider to address any new side effects that develop. The patient or guardian should update outpatient providers of any new medications and/or medication changes.   Atypical antipsychotics: If you are prescribed an atypical antipsychotic, it is recommended that your height, weight, BMI, blood pressure, fasting lipid panel, and fasting blood sugar be monitored by your outpatient providers.  Safety:  The patient should abstain from use of illicit substances/drugs and abuse of any medications. If symptoms worsen or do not continue to improve or if the patient becomes actively suicidal or homicidal then it is recommended that the patient return to the closest hospital emergency department, the [facility], or call 911 for further evaluation and treatment. National Suicide Prevention Lifeline 1-800-SUICIDE or (575)303-1462.  About 988 988 offers 24/7 access to trained crisis counselors who can help people experiencing mental health-related distress. People can call or text 988 or chat 988lifeline.org for themselves or if they are worried about a loved one who may need crisis support.  Crisis Mobile: [facility]:                     510-259-4926 (for crisis response 24 hours a day) [facility] Hotline:                                            (364)264-4751    Safety Plan Juan [name] will reach out to [name], call 911 or call mobile crisis, or go to nearest emergency room if condition worsens or if  suicidal thoughts become active [facility]ication management).  The suicide prevention education provided includes the following: Suicide risk factors Suicide prevention and interventions National Suicide Hotline telephone number [facility] assessment telephone number [facility] Emergency Assistance 911 [facility] and/or Residential Mobile Crisis Unit telephone number Request made of family/significant other to:  [name] (e.g., guns, rifles, knives), all items previously/currently identified as safety concern.   Remove drugs/medications (over the counter, prescriptions, illicit drugs), all items previously/currently identified as a safety concern.    You have had continued to have problems with weakness and your gait.  Schedule a follow-up with your neurologist for continued surveillance and evaluation as soon as possible.  If you cannot be seen by your neurologist or need to be seen locally, a referral has been placed to [facility].  You could schedule an appointment for recheck there.  You must avoid ALL alcohol.

## 2023-05-27 NOTE — Progress Notes (Signed)
[facility] Psych ED Progress Note  05/27/2023 1:45 PM Juan [name]  MRN:  [id number]    Principal Problem: Bipolar 2 disorder, major depressive episode (HCC) Diagnosis:  Principal Problem:   Bipolar 2 disorder, major depressive episode (HCC) Active Problems:   GAD (generalized anxiety disorder)   PTSD (post-traumatic stress disorder)   Tobacco chew use   Alcohol use disorder   Arthritis    ED Assessment Time Calculation: Start Time: 1100 Stop Time: 1120 Total Time in Minutes (Assessment Completion): 20   Past Psychiatric History: On evaluation today, the patient is laying in his bed, appears to be in no acute distress, as he just finished with physical therapy. He is calm and cooperative during this assessment. His appearance is appropriate for environment. His eye contact is good. Speech is clear and coherent, normal pace and normal volume. He is alert and oriented x3 to person, place, and situation. He reports his mood is euthymic.  Affect is congruent with mood.  Thought process is coherent.  Thought content is within normal limits. He denies auditory and visual hallucinations.  No indication that he is responding to internal stimuli during this assessment.  No delusions elicited during this assessment.  He denies suicidal ideations. He denies homicidal ideations. Appetite he says is "ok" and sleep "not good, here in the hospital, I need to be in my own bed". Patient states he is "feeling good" he hates the walker, because he uses a rollater at home.  Patient discussed that he feels he has been having a hard time, because he bottles things up, provider encouraged him to open up to his wife and therapist or anyone he feels safe with. He states that he will allow his wife to assist him more, by talking with her and allowing her to administer his medications, so that he can stay on his medications and be compliant.  Discussed with patient to please refrain from using alcohol or illicit substances,  as they can affect your mood and can cause depression, anxiety or other concerning symptoms. Alcohol can increase the chance that a person will make reckless decisions, like attempting suicide, and can increase the lethality of a drug overdose.    Spoke with patient wife Juan [name] and she feels hat he is safe to return home. All guns have been removed from the home. She states he has a strong support system with friends and family calling to check on him daily, a marine support group that also calls pt, as well as the [name] of animals in the home. Pt has a Psychologist, sport and exercise who provides sobriety support and pt attends a nightly virtual AA meeting. She is setting up emergency appointments for patient to receive psychiatric treatment and medication management through the [place]. Discussed methods to reduce the risk of self-injury or suicide attempts: Frequent conversations regarding unsafe thoughts. Remove all significant sharps. Remove all firearms. Remove all medications, including over-the-counter meds. Consider lockbox for medications and having a responsible person dispense medications until patient has strengthened coping skills. Room checks for sharps or other harmful objects. Secure all chemical substances that can be ingested or inhaled.  Dicussed with Mrs. Nichols to help patient refrain from using alcohol or illicit substances, as they can affect his mood and can cause depression, anxiety or other concerning symptoms. Alcohol can increase the chance that a person will make reckless decisions, like attempting suicide, and can increase the lethality of a drug overdose.  She states that they do not have any  alcohol in their home.  Discussed crisis plan, calling 911, or going to the ED if condition changes or worsens. Patient verbalized understanding.     [place] Scale:  Flowsheet Row ED to Hosp-Admission (Current) from 05/22/2023 in [facility] Emergency Department at [facility] Most recent reading at 05/27/2023  1:43 PM ED from 05/22/2023 in [facility] Emergency Department at [facility] Most recent reading at 05/22/2023 12:16 AM ED from 04/03/2023 in [facility] Emergency Department at [facility] Most recent reading at 04/03/2023  3:09 PM  C-SSRS RISK CATEGORY Low Risk High Risk No Risk       Past Medical History:  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   Chronic fatigue    Cirrhosis (HCC)    Colon polyps    Depression    GERD (gastroesophageal reflux disease)    Hemochromatosis    Hiatal hernia 02/20/2022   Hx of blood clots    Leg   Hyperreflexia    Hypertension    PTSD (post-traumatic stress disorder)    Traumatic hemorrhagic shock [facility])     Past Surgical History:  Procedure Laterality Date   BIOPSY  09/24/2021   Procedure: BIOPSY;  Surgeon: Imogene Burn, MD;  Location: [facility] ENDOSCOPY;  Service: Gastroenterology;;   Fidela Salisbury RELEASE Bilateral    COLONOSCOPY WITH PROPOFOL N/A 09/24/2021   Procedure: COLONOSCOPY WITH PROPOFOL;  Surgeon: Imogene Burn, MD;  Location: [facility] ENDOSCOPY;  Service: Gastroenterology;  Laterality: N/A;   ESOPHAGOGASTRODUODENOSCOPY (EGD) WITH PROPOFOL N/A 09/24/2021   Procedure: ESOPHAGOGASTRODUODENOSCOPY (EGD) WITH PROPOFOL;  Surgeon: Imogene Burn, MD;  Location: [facility] ENDOSCOPY;  Service: Gastroenterology;  Laterality: N/A;   ESOPHAGOGASTRODUODENOSCOPY (EGD) WITH PROPOFOL N/A 08/18/2022   Procedure: ESOPHAGOGASTRODUODENOSCOPY (EGD) WITH PROPOFOL;  Surgeon: Hilarie Fredrickson, MD;  Location: [facility] ENDOSCOPY;  Service: Gastroenterology;  Laterality: N/A;   FLEXIBLE SIGMOIDOSCOPY N/A 08/18/2022   Procedure: FLEXIBLE SIGMOIDOSCOPY;  Surgeon: Hilarie Fredrickson, MD;  Location: [facility] ENDOSCOPY;  Service: Gastroenterology;  Laterality: N/A;   FRACTURE SURGERY     left  ankle plate   HERNIA REPAIR     inguinal   KNEE SURGERY Right    x 4   SHOULDER SURGERY Bilateral    x 2   VASECTOMY     Family History:  Family History  Problem Relation Age of Onset   Pulmonary fibrosis Mother    Hypertension Father    Other Father        liver failure   Diabetes Brother    Breast cancer Paternal Aunt    Colon cancer Neg Hx    Esophageal cancer Neg Hx    Stomach cancer Neg Hx    Rectal cancer Neg Hx     Social History:  Social History   Substance and Sexual Activity  Alcohol Use Not Currently     Social History   Substance and Sexual Activity  Drug Use Never    Social History   Socioeconomic History   Marital status: Married    Spouse name: Juan   Number of children: 2   Years of education: Not on file   Highest education level: 12th grade  Occupational History    Comment: disability  Tobacco Use   Smoking status: Never   Smokeless tobacco: Current    Types: Snuff  Vaping  Use   Vaping status: Never Used  Substance and Sexual Activity   Alcohol use: Not Currently   Drug use: Never   Sexual activity: Yes    Partners: Female  Other Topics Concern   Not on file  Social History Narrative   Lives with wife   Social Determinants of Health   Financial Resource Strain: Medium Risk (01/09/2023)   Overall Financial Resource Strain (CARDIA)    Difficulty of Paying Living Expenses: Somewhat hard  Food Insecurity: No Food Insecurity (05/22/2023)   Hunger Vital Sign    Worried About Running Out of Food in the Last Year: Never true    Ran Out of Food in the Last Year: Never true  Transportation Needs: No Transportation Needs (05/22/2023)   PRAPARE - Administrator, Civil Service (Medical): No    Lack of Transportation (Non-Medical): No  Physical Activity: Insufficiently Active (01/09/2023)   Exercise Vital Sign    Days of Exercise per Week: 1 day    Minutes of Exercise per Session: 10 min  Stress: Stress Concern Present  (01/09/2023)   [employer] of Occupational Health - Occupational Stress Questionnaire    Feeling of Stress : To some extent  Social Connections: Moderately Integrated (01/09/2023)   Social Connection and Isolation Panel [NHANES]    Frequency of Communication with Friends and Family: More than three times a week    Frequency of Social Gatherings with Friends and Family: Once a week    Attends Religious Services: More than 4 times per year    Active Member of [employer] or Organizations: No    Attends Engineer, structural: Not on file    Marital Status: Married    Sleep: Fair  Appetite:  Fair  Current Medications: Current Facility-Administered Medications  Medication Dose Route Frequency Provider Last Rate Last Admin   acetaminophen (TYLENOL) tablet 650 mg  650 mg Oral Q6H PRN Dahlia Byes C, NP   650 mg at 05/25/23 0450   alum & mag hydroxide-simeth (MAALOX/MYLANTA) 200-200-20 MG/5ML suspension 30 mL  30 mL Oral Q4H PRN Dahlia Byes C, NP       diclofenac Sodium (VOLTAREN) 1 % topical gel 2 g  2 g Topical QID PRN Nkwenti, Doris, NP       diphenhydrAMINE (BENADRYL) capsule 50 mg  50 mg Oral TID PRN Dahlia Byes C, NP       Or   diphenhydrAMINE (BENADRYL) injection 50 mg  50 mg Intramuscular TID PRN Dahlia Byes C, NP       folic acid (FOLVITE) tablet 1 mg  1 mg Oral Daily Onuoha, Josephine C, NP   1 mg at 05/27/23 0846   gabapentin (NEURONTIN) capsule 200 mg  200 mg Oral TID Starleen Blue, NP   200 mg at 05/27/23 0846   hydrOXYzine (ATARAX) tablet 25 mg  25 mg Oral Q6H PRN Starleen Blue, NP   25 mg at 05/27/23 1006   lactulose (CHRONULAC) 10 GM/15ML solution 10 g  10 g Oral Daily PRN Dahlia Byes C, NP       LORazepam (ATIVAN) tablet 2 mg  2 mg Oral TID PRN Dahlia Byes C, NP   2 mg at 05/27/23 2130   Or   LORazepam (ATIVAN) injection 2 mg  2 mg Intramuscular TID PRN Dahlia Byes C, NP       multivitamin with minerals tablet 1 tablet  1  tablet Oral Daily Dahlia Byes C, NP   1 tablet at 05/27/23  1610   nicotine (NICODERM CQ - dosed in mg/24 hours) patch 21 mg  21 mg Transdermal Daily Starleen Blue, NP   21 mg at 05/27/23 0847   OLANZapine zydis (ZYPREXA) disintegrating tablet 2.5 mg  2.5 mg Oral QHS [name], Jahanna Raether A, PMHNP       pantoprazole (PROTONIX) EC tablet 40 mg  40 mg Oral Daily Onuoha, Josephine C, NP   40 mg at 05/27/23 0846   thiamine (VITAMIN B1) tablet 100 mg  100 mg Oral Daily Dahlia Byes C, NP   100 mg at 05/27/23 9604   Or   thiamine (VITAMIN B1) injection 100 mg  100 mg Intravenous Daily Dahlia Byes C, NP       Current Outpatient Medications  Medication Sig Dispense Refill   busPIRone (BUSPAR) 15 MG tablet Take 1 tablet (15 mg total) by mouth 2 (two) times daily. 180 tablet 2   diazepam (VALIUM) 10 MG tablet Take 1 tablet by mouth as directed; (take 1 tablet by mouth 2 hrs before appointment and bring the rest with you) 3 tablet 0   diclofenac Sodium (VOLTAREN) 1 % GEL Apply 4 grams topically 4 (four) times daily to affected joint. (Patient not taking: Reported on 05/22/2023) 100 g 11   erythromycin ophthalmic ointment Apply a 1 centimeter ribbon into the conjunctival sac(s) in affected eye(s) 2 times daily. (Patient not taking: Reported on 05/22/2023) 3.5 g 0   furosemide (LASIX) 40 MG tablet Take 1 tablet (40 mg) by mouth daily. (Patient taking differently: Take 40 mg by mouth as needed for edema.) 30 tablet 1   hydrOXYzine (ATARAX) 25 MG tablet Take 0.5-1 tablets (12.5-25 mg total) by mouth every 8 (eight) hours as needed for itching. 30 tablet 1   lactulose, encephalopathy, (CHRONULAC) 10 GM/15ML SOLN Take 10 g by mouth daily as needed (constipation).     magnesium oxide (MAG-OX) 400 MG tablet Take 1 tablet (400 mg total) by mouth 2 (two) times daily. 120 tablet 2   ondansetron (ZOFRAN-ODT) 8 MG disintegrating tablet Dissolve 1 tablet (8 mg total) by mouth every 8 (eight) hours as needed  for nausea. 30 tablet 1   pantoprazole (PROTONIX) 40 MG tablet Take 1 tablet (40 mg total) by mouth daily. (Patient not taking: Reported on 05/22/2023) 30 tablet 1   potassium chloride SA (KLOR-CON M) 20 MEQ tablet Take 1 tablet (20 mEq total) by mouth 2 (two) times daily. 180 tablet 1   saccharomyces boulardii (FLORASTOR) 250 MG capsule Take 250 mg by mouth 2 (two) times daily.      traZODone (DESYREL) 50 MG tablet Take 1-2 tablets (50-100 mg total) by mouth at bedtime as needed for sleep. 90 tablet 3   UNKNOWN TO PATIENT Apply 1 patch topically at bedtime. Sweet Dreams patch     vortioxetine HBr (TRINTELLIX) 10 MG TABS tablet Take 1 tablet (10 mg total) by mouth daily. (Patient not taking: Reported on 05/22/2023) 30 tablet 3    Lab Results:  Results for orders placed or performed during the hospital encounter of 05/22/23 (from the past 48 hour(s))  Urinalysis, w/ Reflex to Culture (Infection Suspected) -Urine, Clean Catch     Status: Abnormal   Collection Time: 05/25/23  3:59 PM  Result Value Ref Range   Specimen Source URINE, CLEAN CATCH    Color, Urine AMBER (A) YELLOW    Comment: BIOCHEMICALS MAY BE AFFECTED BY COLOR   APPearance CLEAR CLEAR   Specific Gravity, Urine 1.020 1.005 - 1.030  pH 7.0 5.0 - 8.0   Glucose, UA NEGATIVE NEGATIVE mg/dL   Hgb urine dipstick NEGATIVE NEGATIVE   Bilirubin Urine NEGATIVE NEGATIVE   Ketones, ur NEGATIVE NEGATIVE mg/dL   Protein, ur NEGATIVE NEGATIVE mg/dL   Nitrite NEGATIVE NEGATIVE   Leukocytes,Ua NEGATIVE NEGATIVE   RBC / HPF 0-5 0 - 5 RBC/hpf   WBC, UA 0-5 0 - 5 WBC/hpf    Comment:        Reflex urine culture not performed if WBC <=10, OR if Squamous epithelial cells >5. If Squamous epithelial cells >5 suggest recollection.    Bacteria, UA NONE SEEN NONE SEEN   Squamous Epithelial / HPF 0-5 0 - 5 /HPF   Mucus PRESENT     Comment: Performed at [facility], [address], [place], [place] [address]  Ethanol      Status: None   Collection Time: 05/26/23  3:30 PM  Result Value Ref Range   Alcohol, Ethyl (B) <10 <10 mg/dL    Comment: (NOTE) Lowest detectable limit for serum alcohol is 10 mg/dL.  For medical purposes only. Performed at [facility], [address], [place], [place] [address]     Blood Alcohol level:  Lab Results  Component Value Date   ETH <10 05/26/2023   ETH 333 (HH) 05/22/2023    Physical Findings:  CIWA:  CIWA-Ar Total: 4 COWS:     Musculoskeletal:  Patient observed resting in bed, but he is able to ambulate with a walker and one man assist.  Psychiatric Specialty Exam:  Presentation  General Appearance:  Appropriate for Environment  Eye Contact: Good  Speech: Clear and Coherent  Speech Volume: Normal  Handedness: Right   Mood and Affect  Mood: Euthymic  Affect: Appropriate   Thought Process  Thought Processes: Coherent  Descriptions of Associations:Intact  Orientation:Full (Time, Place and Person)  Thought Content:WDL  History of Schizophrenia/Schizoaffective disorder:No  Duration of Psychotic Symptoms:No data recorded Hallucinations:Hallucinations: None  Ideas of Reference:None  Suicidal Thoughts:Suicidal Thoughts: No  Homicidal Thoughts:Homicidal Thoughts: No   Sensorium  Memory: Immediate Fair; Recent Fair  Judgment: Fair  Insight: Good   Executive Functions  Concentration: Good  Attention Span: Good  Recall: Good  Fund of Knowledge: Good  Language: Good   Psychomotor Activity  Psychomotor Activity: Psychomotor Activity: Normal   Assets  Assets: Communication Skills; Desire for Improvement; Social Support; Housing   Sleep  Sleep: Sleep: Fair    Physical Exam: Physical Exam Vitals and nursing note reviewed. Exam conducted with a chaperone present.  Psychiatric:        Attention and Perception: Attention normal.        Mood and Affect: Mood normal.         Speech: Speech normal.        Behavior: Behavior is cooperative.        Thought Content: Thought content normal.        Cognition and Memory: Memory normal.        Judgment: Judgment is impulsive.    Review of Systems  Constitutional: Negative.   Psychiatric/Behavioral:  Positive for substance abuse.    Blood pressure 121/77, pulse 81, temperature 98.2 F (36.8 C), temperature source Oral, resp. rate 16, height 5\' 5"  (1.651 m), weight 77.4 kg, SpO2 98%. Body mass index is 28.39 kg/m.    Medical Decision Making: Patient is psychiatrically cleared. Patient case review and discussed with Dr. Clovis Riley, and patient does not meet inpatient criteria for  inpatient psychiatric treatment, due to facilities declining his admission at this time due his disabilities. At time of discharge, patient denies SI, HI, AVH and can contract for safety. He demonstrated no overt evidence of psychosis or mania. Prior to discharge, he verbalized that they understood warning signs, triggers, and symptoms of worsening mental health and how to access emergency mental health care if they felt it was needed. Patient was instructed to call 911 or return to the emergency room if they experienced any concerning symptoms after discharge. Patient voiced understanding and agreed to the above.  Patient given resources to follow up with behavioral health urgent care for therapy and medication management. Patient denies access to weapons. Safety planning completed, with patient and wife.    Safety Plan Juan [name] will reach out to his wife Juan [name], call 911 or call mobile crisis, or go to nearest emergency room if condition worsens or if suicidal thoughts become active [facility]ication management).  The suicide prevention education provided includes the following: Suicide risk factors Suicide prevention and interventions National Suicide Hotline  telephone number [facility] assessment telephone number [facility] Emergency Assistance 911 [facility] and/or Residential Mobile Crisis Unit telephone number Request made of family/significant other to:  wife Juan [name] (e.g., guns, rifles, knives), all items previously/currently identified as safety concern.   Remove drugs/medications (over the counter, prescriptions, illicit drugs), all items previously/currently identified as a safety concern.     Juan [name], PMHNP 05/27/2023, 1:45 PM

## 2023-05-27 NOTE — Discharge Summary (Addendum)
Physician Discharge Summary Note  Patient:  Juan [name] is an 57 y.o., male MRN:  [id number] DOB:  Dec 19, 1965 Patient phone:  [phone] (home)  Patient address:   [address] [place] [address],  Total Time spent with patient: 45 minutes  Date of Admission:  05/22/2023 Date of Discharge: 05/25/2023  Reason for Admission: Red Holiday is a 57 year old Caucasian male with prior mental health history of GAD, PTSD, ETOH use d/o and MDD who presented to the [facility] ED on 09/29 with complaints of suicidal ideations with a plan to use a gun to shoot himself in the head.  As per ED documentations, patient shot the gun into a bag at his home prior to calling his daughter to remove it from his home. Patient was subsequently IVC'd prior to being transferred to this hospital for treatment and stabilization of his mental status.   Principal Problem: Bipolar 2 disorder, major depressive episode [facility]) Discharge Diagnoses: Principal Problem:   Bipolar 2 disorder, major depressive episode (HCC) Active Problems:   GAD (generalized anxiety disorder)   PTSD (post-traumatic stress disorder)   Tobacco chew use   Alcohol use disorder   Arthritis  Hospital Course:   During the patient's hospitalization, patient had extensive initial psychiatric evaluation, and follow-up psychiatric evaluations every day. Psychiatric diagnoses provided upon initial assessment: As above. Patient's psychiatric medications were adjusted on admission as follows: -Start gabapentin 200 mg 3 times daily for neuropathy, alcohol use disorder, GAD. -Discontinue Trintellix as patient states it is not effective -Discontinue BuSpar as patient states it is not effective -Start Abilify 5 mg daily for mood stabilization -Will start Lexapro 5 mg on 10/2 for depressive symptoms -Continue Ativan detox protocol for alcohol use-please see tomorrow for complete order -Continue Protonix 40 mg daily for  GERD -Discontinue trazodone due to complaints of vivid dreams -Start nicotine patch 21 mg for tobacco use  During the hospitalization, no other adjustments were made to the patient's psychiatric medication regimen. Lexapro was never started due to concerns of triggering mania.   Patient's care was discussed during the interdisciplinary team meeting every day during the hospitalization. The patient denied having side effects to prescribed psychiatric medication. The patient was evaluated each day by a clinical provider to ascertain response to treatment. Improvement was noted by the patient's report of decreasing symptoms, improved sleep and appetite, affect, medication tolerance, behavior, and participation in unit programming.  Patient was asked each day to complete a self inventory noting mood, mental status, pain, new symptoms, anxiety and concerns. Symptoms were reported as significantly decreased or resolved completely by discharge. The patient denied having suicidal thoughts for more than 48 hours prior to discharge.  Patient denied having homicidal thoughts.  Patient denied having auditory hallucinations.  Patient denied any visual hallucinations or other symptoms of psychosis. Pt vomited in the morning of 10/2, and noted to be diaphoretic, and not as responsive as his baseline at arrival to the hospital, respirations even/unlabored, V/S WNL, able to follow directions, appeared lethargic, orders given for nursing to transfer to the [facility] for medical workup. ED provider at the [facility] notified. transferred via non emergency ambulance service for medical evaluation.   During the course of the hospitalization at [facility], pt was maintained on 1:1 staff monitoring due to continuing to require complete assistance with ADLs. He was also incontinent of urine, and required frequent assistance with changing his incontinent briefs, due to being very unstable on his feet. Pt was placed in a wheelchair  after admission to  the unit due to his grossly unstable gait. He was using a Rolator which had a piece of wire attached to it prior to arrival to the unit, and it posed a ligature risk, thereby requiring to be secured. As per OT, the stepping walkers on the unit posed a higher fall risk for patient as he also has moderate cognitive impairment as per the [facility] assessment completed by OT. Below is an excerpt from the MoCa score:  Executive Dysfunction: The low score in executive function (1/5) suggests difficulties with cognitive flexibility, planning, and visuospatial skills. Errors in the Trail Making Test and inability to recreate the 3D chair point toward deficits in conceptual shifting and visuoconstructional abilities. The stimulus-bound error in the clock drawing task indicates impaired executive planning and abstraction. Attention Deficits: A score of 2/6 in the attention domain reflects problems with sustained attention, concentration, and working memory ([place], Beverly Milch, OT,Occupational [place], Date of Service: 05/24/2023  8:56 PM )-Please see complete OT assessment from the above date.  The  above cognitive difficulties as well as his physical limitations render patient to have needs that exceed what [facility] can reasonably offer him, and thereby, it would not be appropriate for him to return to [facility] after he is treated at the [facility]. It is therefore recommended for a consult to be placed with transitions of care after patient is treated at [facility], so that patient can be placed appropriately, where his needs will be adequately met. As per attending Psychiatrist who oversaw patient's care on unit:  "During his stay on [facility] patient had no ability to mobilize on the unit or participate in any group activities, MoCA testing completed yesterday indicating moderate cognitive impairment including executive function ability, in my opinion after medical stabilization emergency room patient is not suitable to receive care at [facility] as he  needs and required higher level of care, recommended transfer to [facility] psych unit after medical stabilization." Abbott Pao, Nadir, MD at 05/25/2023  9:42 AM)  Labs were reviewed with the patient, and abnormal results were discussed with the patient.  The patient is able to verbalize their individual safety plan to this provider.  # It is recommended to the patient to continue psychiatric medications as prescribed, after discharge from the hospital.    # It is recommended to the patient to follow up with your outpatient psychiatric provider and PCP.  # It was discussed with the patient, the impact of alcohol, drugs, tobacco have been there overall psychiatric and medical wellbeing, and total abstinence from substance use was recommended the patient.  # Prescriptions not provided at discharge since patient was transferred to the [facility] for medical evaluation. Patient agreeable to plan. Given opportunity to ask questions.    # In the event of worsening symptoms, the patient is instructed to call the crisis hotline (988), 911 and or go to the nearest ED for appropriate evaluation and treatment of symptoms. 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   Chronic fatigue    Cirrhosis (HCC)    Colon polyps    Depression    GERD (gastroesophageal reflux disease)    Hemochromatosis    Hiatal hernia  02/20/2022   Hx of blood clots    Leg   Hyperreflexia    Hypertension    PTSD (post-traumatic stress disorder)    Traumatic hemorrhagic shock [facility])     Past Surgical History:  Procedure Laterality Date   BIOPSY  09/24/2021   Procedure: BIOPSY;  Surgeon: Imogene Burn, MD;  Location[facility] END[facility]COPY;   Service: Gastroenterology;;   Fidela Salisbury RELEASE Bilateral    COLONOSCOPY WITH PROPOFOL N/A 09/24/2021   Procedure: COLONOSCOPY WITH PROPOFOL;  Surgeon: Imogene Burn, MD;  Location: [facility] ENDOSCOPY;  Service: Gastroenterology;  Laterality: N/A;   ESOPHAGOGASTRODUODENOSCOPY (EGD) WITH PROPOFOL N/A 09/24/2021   Procedure: ESOPHAGOGASTRODUODENOSCOPY (EGD) WITH PROPOFOL;  Surgeon: Imogene Burn, MD;  Location: [facility] ENDOSCOPY;  Service: Gastroenterology;  Laterality: N/A;   ESOPHAGOGASTRODUODENOSCOPY (EGD) WITH PROPOFOL N/A 08/18/2022   Procedure: ESOPHAGOGASTRODUODENOSCOPY (EGD) WITH PROPOFOL;  Surgeon: Hilarie Fredrickson, MD;  Location: [facility] ENDOSCOPY;  Service: Gastroenterology;  Laterality: N/A;   FLEXIBLE SIGMOIDOSCOPY N/A 08/18/2022   Procedure: FLEXIBLE SIGMOIDOSCOPY;  Surgeon: Hilarie Fredrickson, MD;  Location: [facility];  Service: Gastroenterology;  Laterality: N/A;   FRACTURE SURGERY     left ankle plate   HERNIA REPAIR     inguinal   KNEE SURGERY Right    x 4   SHOULDER SURGERY Bilateral    x 2   VASECTOMY     Family History:  Family History  Problem Relation Age of Onset   Pulmonary fibrosis Mother    Hypertension Father    Other Father        liver failure   Diabetes Brother    Breast cancer Paternal Aunt    Colon cancer Neg Hx    Esophageal cancer Neg Hx    Stomach cancer Neg Hx    Rectal cancer Neg Hx    Family Psychiatric  History: See H  & P Social History:  Social History   Substance and Sexual Activity  Alcohol Use Not Currently     Social History   Substance and Sexual Activity  Drug Use Never    Social History   Socioeconomic History   Marital status: Married    Spouse name: [name]   Number of children: 2   Years of education: Not on file   Highest education level: 12th grade  Occupational History    Comment: disability  Tobacco Use   Smoking status: Never   Smokeless tobacco: Current    Types: Snuff  Vaping Use   Vaping status: Never Used   Substance and Sexual Activity   Alcohol use: Not Currently   Drug use: Never   Sexual activity: Yes    Partners: Female  Other Topics Concern   Not on file  Social History Narrative   Lives with wife   Social Determinants of Health   Financial Resource Strain: Medium Risk (01/09/2023)   Overall Financial Resource Strain (CARDIA)    Difficulty of Paying Living Expenses: Somewhat hard  Food Insecurity: No Food Insecurity (05/22/2023)   Hunger Vital Sign    Worried About Running Out of Food in the Last Year: Never true    Ran Out of Food in the Last Year: Never true  Transportation Needs: No Transportation Needs (05/22/2023)   PRAPARE - Administrator, Civil Service (Medical): No    Lack of Transportation (Non-Medical): No  Physical Activity: Insufficiently Active (01/09/2023)   Exercise Vital Sign    Days of Exercise per Week: 1 day  Minutes of Exercise per Session: 10 min  Stress: Stress Concern Present (01/09/2023)   [employer] of Occupational Health - Occupational Stress Questionnaire    Feeling of Stress : To some extent  Social Connections: Moderately Integrated (01/09/2023)   Social Connection and Isolation Panel [NHANES]    Frequency of Communication with Friends and Family: More than three times a week    Frequency of Social Gatherings with Friends and Family: Once a week    Attends Religious Services: More than 4 times per year    Active Member of [employer] or Organizations: No    Attends Engineer, structural: Not on file    Marital Status: Married   Physical Findings: AIMS: 0 CIWA:  CIWA-Ar Total: 4 COWS:     Musculoskeletal: Strength & Muscle Tone: decreased-Grossly unsteady gait  Gait & Station: unsteady Patient leans: N/A   Psychiatric Specialty Exam:  Presentation  General Appearance:  Appropriate for Environment  Eye Contact: Good  Speech: Clear and Coherent; Slow  Speech Volume: Decreased  Handedness: Right   Mood  and Affect  Mood: Depressed; Hopeless  Affect: Appropriate; Depressed   Thought Process  Thought Processes: Coherent  Descriptions of Associations:Intact  Orientation:Full (Time, Place and Person)  Thought Content:WDL  History of Schizophrenia/Schizoaffective disorder:No  Duration of Psychotic Symptoms:No data recorded Hallucinations:Hallucinations: None  Ideas of Reference:None  Suicidal Thoughts:Suicidal Thoughts: Yes, Passive  Homicidal Thoughts:Homicidal Thoughts: No   Sensorium  Memory: Immediate Fair; Recent Fair  Judgment: Poor  Insight: Fair   Chartered certified accountant: Fair  Attention Span: Fair  Recall: [employer] of Knowledge: Fair  Language: Fair  Psychomotor Activity  Psychomotor Activity: Psychomotor Activity: Decreased  Assets  Assets: Communication Skills; Desire for Improvement; Social Support; Physical Health   Sleep  Sleep: Sleep: Fair  Physical Exam: Physical Exam Review of Systems  Constitutional: Negative.   HENT: Negative.    Respiratory: Negative.    Cardiovascular: Negative.   Skin: Negative.   Psychiatric/Behavioral:  Positive for substance abuse (Etoh abuse). Negative for depression, hallucinations, memory loss and suicidal ideas. The patient is nervous/anxious and has insomnia.   Assessments above completed on 10/02  Blood pressure 133/77, pulse 83, temperature 98.3 F (36.8 C), temperature source Oral, resp. rate 18, height 5\' 5"  (1.651 m), weight 77.4 kg, SpO2 96%. Body mass index is 28.39 kg/m.   Social History   Tobacco Use  Smoking Status Never  Smokeless Tobacco Current   Types: Snuff   Tobacco Cessation:  Prescription not provided because: Patient was [facility]fer[facility] the [facility]   Blood Alcohol level:  Lab Results  Component Value Date   ETH <10 05/26/2023   ETH 333 (HH) 05/22/2023    Metabolic Disorder Labs:  Lab Results  Component Value Date   HGBA1C 5.1 04/08/2021    MPG 91.06 08/14/2020   Lab Results  Component Value Date   PROLACTIN 19.0 (H) 01/12/2023   Lab Results  Component Value Date   CHOL 130 05/12/2023   TRIG 84 05/12/2023   HDL 29 (L) 05/12/2023   CHOLHDL 5.2 (H) 04/09/2022   LDLCALC 84 05/12/2023   LDLCALC 123 (H) 04/09/2022   See Psychiatric Specialty Exam and Suicide Risk Assessment completed by Attending Physician prior to discharge.  Discharge destination:  Other:  [facility]  Is patient on multiple antipsychotic therapies at discharge:  No   Has Patient had three or more failed trials of antipsychotic monotherapy by history:  No  Recommended Plan for Multiple Antipsychotic  Therapies: NA  Patient was [facility] to the [facility] ER on 05/25/2023.    Follow-up Information     [facility].   Contact information: [address] [facility] [place] [address] [phone]                Follow-up recommendations:  Other:  Transitions of care consult to determine appropriate placement for patient.  Signed: Starleen Blue, NP 05/27/2023, 9:28 AM

## 2023-05-27 NOTE — Progress Notes (Signed)
LCSW Progress Note  [id number]   Juan [name]  05/27/2023  12:51 AM    Inpatient Behavioral Health Placement  Pt meets inpatient criteria per Alona Bene, PMHNP. There are no available beds within [facility] per [facility] Brook McNichol,RN. Referral was sent to the following facilities;   Destination  Service Provider Address Phone [facility]  [address] [place], [place] [address] [phone]  [facility] Patient Placement  [facility] [place], [place] [phone]  [facility] [address]  [place] [address] [phone]  [facility]  [facility] [place] [address] [phone]  [facility]  [address] [place] [address] [phone]  [facility] [address]  [address] [place] [address] [phone]  [facility]  [address] [place], [place] [address] [phone]  [facility]  [address], [place] [address] [phone]  [facility]  [address] [place] [address] [phone]  [facility]  [address], [place] [address] [phone]  [facility]  [address], [place] [address] [phone]  [facility]  [place] [phone]  [facility] EFAX  [address] [place], [place] [phone]  [facility]  [address] [place] [address] [phone]  [facility]  [address], [place] [address] [phone]  [facility]  [address] [place],  [place] [address] [phone]  [facility]  [address] [place] [address] [phone]  [facility]  [address], [place] [address] [phone]  [facility] [place]  [place] [phone]  [facility]  [address] [place] [address] [phone]  [facility]  [address] [place] [address] [phone]  [facility]  [address] [place] [address] [phone]  [facility]  [address] [place], [place] [address] [phone]  [facility]  [address] [place] [address] [phone]  [facility]  [address] [place] [address] [phone]  [facility]  [address], [place] [address] [phone]  [facility]  [address] [place] [address] [phone]  [facility]  1 medical [place] [address] [phone]  [facility]  [address] [place] [address] [phone]    Situation ongoing,  CSW will follow up.    Maryjean Ka, MSW, LCSWA 05/27/2023 12:51 AM

## 2023-05-31 ENCOUNTER — Other Ambulatory Visit (HOSPITAL_BASED_OUTPATIENT_CLINIC_OR_DEPARTMENT_OTHER): Payer: Self-pay

## 2023-05-31 ENCOUNTER — Telehealth: Payer: Self-pay | Admitting: Family Medicine

## 2023-05-31 NOTE — Telephone Encounter (Signed)
Patient called in stating that he can not sleep, tried melatonin and that's not helping. also tried sleep patches and that did not work.

## 2023-06-01 ENCOUNTER — Other Ambulatory Visit: Payer: Self-pay

## 2023-06-01 ENCOUNTER — Inpatient Hospital Stay: Payer: Commercial Managed Care - PPO | Admitting: Family Medicine

## 2023-06-01 NOTE — Telephone Encounter (Signed)
What medications does he have at home/what was he prescribed at discharge?  There is no discharge summary from the hospital at this time

## 2023-06-01 NOTE — Telephone Encounter (Signed)
Patient states he does not have Zyprexa at home.  He is not sure when his next behavioral health appt is but will as chrystal and will call back to let us know. He states he is going to use the patch again tonight- states he felt like he started this too early the last time he usedit.

## 2023-06-01 NOTE — Telephone Encounter (Signed)
Attempted call to patient. Mail box full. Could not leave a voice mail message.  

## 2023-06-02 ENCOUNTER — Encounter: Payer: Self-pay | Admitting: Family Medicine

## 2023-06-02 ENCOUNTER — Other Ambulatory Visit (HOSPITAL_BASED_OUTPATIENT_CLINIC_OR_DEPARTMENT_OTHER): Payer: Self-pay

## 2023-06-02 ENCOUNTER — Ambulatory Visit: Payer: Commercial Managed Care - PPO | Admitting: Family Medicine

## 2023-06-02 VITALS — BP 118/7 | HR 85 | Ht 65.0 in | Wt 169.0 lb

## 2023-06-02 DIAGNOSIS — K7030 Alcoholic cirrhosis of liver without ascites: Secondary | ICD-10-CM

## 2023-06-02 DIAGNOSIS — F3181 Bipolar II disorder: Secondary | ICD-10-CM

## 2023-06-02 DIAGNOSIS — Z720 Tobacco use: Secondary | ICD-10-CM | POA: Diagnosis not present

## 2023-06-02 DIAGNOSIS — K709 Alcoholic liver disease, unspecified: Secondary | ICD-10-CM | POA: Diagnosis not present

## 2023-06-02 MED ORDER — QUETIAPINE FUMARATE 50 MG PO TABS
25.0000 mg | ORAL_TABLET | Freq: Every day | ORAL | 0 refills | Status: DC
Start: 2023-06-02 — End: 2023-09-19
  Filled 2023-06-02: qty 90, 90d supply, fill #0

## 2023-06-02 NOTE — Patient Instructions (Addendum)
Try seroquel 25mg  initially.  May increase to 50mg  if needed.  Discontinue trazodone.   Continue all other medications.   See me again in 6 weeks.

## 2023-06-03 ENCOUNTER — Other Ambulatory Visit (HOSPITAL_BASED_OUTPATIENT_CLINIC_OR_DEPARTMENT_OTHER): Payer: Self-pay

## 2023-06-03 LAB — CMP14+EGFR
ALT: 35 [IU]/L (ref 0–44)
AST: 65 [IU]/L — ABNORMAL HIGH (ref 0–40)
Albumin: 3.1 g/dL — ABNORMAL LOW (ref 3.8–4.9)
Alkaline Phosphatase: 118 [IU]/L (ref 44–121)
BUN/Creatinine Ratio: 10 (ref 9–20)
BUN: 10 mg/dL (ref 6–24)
Bilirubin Total: 2.6 mg/dL — ABNORMAL HIGH (ref 0.0–1.2)
CO2: 21 mmol/L (ref 20–29)
Calcium: 8.6 mg/dL — ABNORMAL LOW (ref 8.7–10.2)
Chloride: 103 mmol/L (ref 96–106)
Creatinine, Ser: 0.96 mg/dL (ref 0.76–1.27)
Globulin, Total: 3 g/dL (ref 1.5–4.5)
Glucose: 130 mg/dL — ABNORMAL HIGH (ref 70–99)
Potassium: 3.3 mmol/L — ABNORMAL LOW (ref 3.5–5.2)
Sodium: 137 mmol/L (ref 134–144)
Total Protein: 6.1 g/dL (ref 6.0–8.5)
eGFR: 93 mL/min/{1.73_m2} (ref >59)

## 2023-06-03 LAB — CBC WITH DIFFERENTIAL/PLATELET
Basophils Absolute: 0.1 10*3/uL (ref 0.0–0.2)
Basos: 1 %
EOS (ABSOLUTE): 0.2 10*3/uL (ref 0.0–0.4)
Eos: 4 %
Hematocrit: 26.9 % — ABNORMAL LOW (ref 37.5–51.0)
Hemoglobin: 8.5 g/dL — ABNORMAL LOW (ref 13.0–17.7)
Immature Grans (Abs): 0 10*3/uL (ref 0.0–0.1)
Immature Granulocytes: 0 %
Lymphocytes Absolute: 1.4 10*3/uL (ref 0.7–3.1)
Lymphs: 30 %
MCH: 24.6 pg — ABNORMAL LOW (ref 26.6–33.0)
MCHC: 31.6 g/dL (ref 31.5–35.7)
MCV: 78 fL — ABNORMAL LOW (ref 79–97)
Monocytes Absolute: 1 10*3/uL — ABNORMAL HIGH (ref 0.1–0.9)
Monocytes: 23 %
Neutrophils Absolute: 1.9 10*3/uL (ref 1.4–7.0)
Neutrophils: 42 %
Platelets: 88 10*3/uL — CL (ref 150–450)
RBC: 3.45 x10E6/uL — ABNORMAL LOW (ref 4.14–5.80)
RDW: 20.2 % — ABNORMAL HIGH (ref 11.6–15.4)
WBC: 4.5 10*3/uL (ref 3.4–10.8)

## 2023-06-03 LAB — AMMONIA: Ammonia: 125 ug/dL (ref 40–200)

## 2023-06-03 MED ORDER — NICOTINE 21 MG/24HR TD PT24
21.0000 mg | MEDICATED_PATCH | Freq: Every day | TRANSDERMAL | 0 refills | Status: DC
  Filled 2023-06-03: qty 28, 28d supply, fill #0

## 2023-06-05 NOTE — Progress Notes (Signed)
Juan [name] - 57 y.o. male MRN [id number]  Date of birth: [birth year]  Subjective Chief Complaint  Patient presents with   Hospitalization Follow-up    HPI Juan [name] is a 57 y.o. here today for hospital follow-up.  Recently hospitalized for suicidal ideation.  Initially had to be medically cleared prior to inpatient psychiatric hospitalization.  He had been recommended suicide by shooting himself in the head however change his mind and discharged firearm into a backpack.  Labs are fairly stable at time of admission.  Potassium was low which is chronic.  Additionally, his alcohol level was elevated at 333.  He continued to have psychiatric evaluations daily and adjustment of his psychotropic medications.  He was started on gabapentin 200 mg 3 times daily for neuropathy and alcohol use disorder.  Trintellix and BuSpar were discontinued as he stated these were not effective.  Lexapro started on 10 2.  Continued on Ativan for alcohol detox.  Trazodone was discontinued due to vivid dreams.  They report that they were not provided with any prescriptions for these new medications at discharge.  He has received his home medications minus trazodone at discharge.  He does plan to follow-up with his [facility] psychiatrist next week but would like referral to civilian psychiatrist.  He is not currently seeing a therapist but would be interested in this.  He does continue to have difficulty with sleep.  He does admit that he had fallen off the wagon recently in regards to his alcohol use.  He has been able to abstain since returning home.  ROS:  A comprehensive ROS was completed and negative except as noted per HPI  Allergies  Allergen Reactions   Apple Juice Swelling and Other (See Comments)    Tongue swelling   Cucumber Extract Itching and Nausea And Vomiting    No extracts; just cucumber   Depakote Er [Divalproex Sodium Er] Swelling and Other (See Comments)    Tongue swelling   Depakote [Valproic  Acid] Anaphylaxis   Peanut Butter Flavor Anaphylaxis and Swelling   Peanut Oil Swelling   Peanut-Containing Drug Products Swelling   Shellfish Allergy Itching and Swelling   Shrimp Extract Itching and Swelling   Strawberry Extract Nausea And Vomiting and Swelling   Apple Swelling   Cantaloupe Extract Allergy Skin Test Rash   Codeine Itching, Rash and Other (See Comments)    Patient reports he can take "CODONES" without problems   Firvanq [Vancomycin] Rash   Lactose Intolerance (Gi) Diarrhea and Other (See Comments)    Indigestion, Stomach pain, Flatulence    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   Chronic fatigue    Cirrhosis (HCC)    Colon polyps    Depression    GERD (gastroesophageal reflux disease)    Hemochromatosis    Hiatal hernia 02/20/2022   Hx of blood clots    Leg   Hyperreflexia    Hypertension    PTSD (post-traumatic stress disorder)    Traumatic hemorrhagic shock [facility])     Past Surgical History:  Procedure Laterality Date   BIOPSY  09/24/2021   Procedure: BIOPSY;  Surgeon: Imogene Burn, MD;  Location: [facility] ENDOSCOPY;  Service: Gastroenterology;;   Fidela Salisbury RELEASE Bilateral    COLONOSCOPY WITH PROPOFOL N/A 09/24/2021   Procedure: COLONOSCOPY WITH PROPOFOL;  Surgeon: Imogene Burn, MD;  Location: [facility] ENDOSCOPY;  Service: Gastroenterology;  Laterality: N/A;   ESOPHAGOGASTRODUODENOSCOPY (EGD) WITH PROPOFOL N/A 09/24/2021   Procedure: ESOPHAGOGASTRODUODENOSCOPY (EGD) WITH PROPOFOL;  Surgeon: Imogene Burn, MD;  Location[facility] ENDOSCOPY;  Service: Gastroenterology;  Laterality: N/A;   ESOPHAGOGASTRODUODENOSCOPY (EGD) WITH PROPOFOL N/A 08/18/2022   Procedure: ESOPHAGOGASTRODUODENOSCOPY (EGD) WITH PROPOFOL;  Surgeon: Hilarie Fredrickson, MD;  Location: [facility] ENDOSCOPY;  Service: Gastroenterology;  Laterality: N/A;    FLEXIBLE SIGMOIDOSCOPY N/A 08/18/2022   Procedure: FLEXIBLE SIGMOIDOSCOPY;  Surgeon: Hilarie Fredrickson, MD;  Location: [facility] ENDOSCOPY;  Service: Gastroenterology;  Laterality: N/A;   FRACTURE SURGERY     left ankle plate   HERNIA REPAIR     inguinal   KNEE SURGERY Right    x 4   SHOULDER SURGERY Bilateral    x 2   VASECTOMY      Social History   Socioeconomic History   Marital status: Married    Spouse name: [name]   Number of children: 2   Years of education: Not on file   Highest education level: 12th grade  Occupational History    Comment: disability  Tobacco Use   Smoking status: Never   Smokeless tobacco: Current    Types: Snuff  Vaping Use   Vaping status: Never Used  Substance and Sexual Activity   Alcohol use: Not Currently   Drug use: Never   Sexual activity: Yes    Partners: Female  Other Topics Concern   Not on file  Social History Narrative   Lives with wife   Social Determinants of Health   Financial Resource Strain: Medium Risk (01/09/2023)   Overall Financial Resource Strain (CARDIA)    Difficulty of Paying Living Expenses: Somewhat hard  Food Insecurity: No Food Insecurity (05/22/2023)   Hunger Vital Sign    Worried About Running Out of Food in the Last Year: Never true    Ran Out of Food in the Last Year: Never true  Transportation Needs: No Transportation Needs (05/22/2023)   PRAPARE - Administrator, Civil Service (Medical): No    Lack of Transportation (Non-Medical): No  Physical Activity: Insufficiently Active (01/09/2023)   Exercise Vital Sign    Days of Exercise per Week: 1 day    Minutes of Exercise per Session: 10 min  Stress: Stress Concern Present (01/09/2023)   [employer] of Occupational Health - Occupational Stress Questionnaire    Feeling of Stress : To some extent  Social Connections: Moderately Integrated (01/09/2023)   Social Connection and Isolation Panel [NHANES]    Frequency of Communication with Friends and  Family: More than three times a week    Frequency of Social Gatherings with Friends and Family: Once a week    Attends Religious Services: More than 4 times per year    Active Member of [employer] or Organizations: No    Attends Engineer, structural: Not on file    Marital Status: Married    Family History  Problem Relation Age of Onset   Pulmonary fibrosis Mother    Hypertension Father    Other Father        liver failure   Diabetes Brother    Breast cancer Paternal Aunt    Colon cancer Neg Hx    Esophageal cancer Neg Hx    Stomach cancer Neg Hx    Rectal cancer Neg Hx     Health Maintenance  Topic Date Due   COVID-19 Vaccine (4 - 2023-24 season) 06/18/2023 (Originally 04/24/2023)  INFLUENZA VACCINE  11/21/2023 (Originally 03/24/2023)   DTaP/Tdap/Td (3 - Td or Tdap) 06/20/2029   Colonoscopy  09/25/2031   Hepatitis C Screening  Completed   HIV Screening  Completed   Zoster Vaccines- Shingrix  Completed   HPV VACCINES  Aged Out     ----------------------------------------------------------------------------------------------------------------------------------------------------------------------------------------------------------------- Physical Exam BP (!) 118/7 (BP Location: Left Arm, Patient Position: Sitting, Cuff Size: Normal)   Pulse 85   Ht 5\' 5"  (1.651 m)   Wt 169 lb (76.7 kg)   SpO2 99%   BMI 28.12 kg/m   Physical Exam Constitutional:      Appearance: Normal appearance.  HENT:     Head: Normocephalic and atraumatic.  Eyes:     General: No scleral icterus. Cardiovascular:     Rate and Rhythm: Normal rate and regular rhythm.  Pulmonary:     Effort: Pulmonary effort is normal.     Breath sounds: Normal breath sounds.  Neurological:     Mental Status: He is alert.  Psychiatric:        Mood and Affect: Mood normal.        Behavior: Behavior normal.      ------------------------------------------------------------------------------------------------------------------------------------------------------------------------------------------------------------------- Assessment and Plan  Bipolar 2 disorder, major depressive episode (HCC) Recent diagnosis of bipolar 2 disorder during his hospitalization.  Denies continued suicidal ideation at this time.  Referral placed to his civilian psychiatrist but I encouraged him to follow-up with his [facility] psychiatrist in the meantime.  He will look into counseling through the [facility] vet center.  He does not have the Abilify that he was taking in the hospital.  Still having difficulty sleeping.  Will add Seroquel for now until seeing psychiatry.  Instructed to contact clinic if having new or worsening symptoms.  Chronic alcoholic liver disease (HCC) He has been off lactulose.  Checking ammonia levels.  Tobacco chew use Will add patches.   Meds ordered this encounter  Medications   QUEtiapine (SEROQUEL) 50 MG tablet    Sig: Take 0.5-1 tablets (25-50 mg total) by mouth at bedtime.    Dispense:  90 tablet    Refill:  0    Return in about 6 weeks (around 07/14/2023) for Mood/BH.    This visit occurred during the SARS-CoV-2 public health emergency.  Safety protocols were in place, including screening questions prior to the visit, additional usage of staff PPE, and extensive cleaning of exam room while observing appropriate contact time as indicated for disinfecting solutions.

## 2023-06-05 NOTE — Assessment & Plan Note (Signed)
He has been off lactulose.  Checking ammonia levels.

## 2023-06-05 NOTE — Assessment & Plan Note (Addendum)
Recent diagnosis of bipolar 2 disorder during his hospitalization.  Denies continued suicidal ideation at this time.  Referral placed to his civilian psychiatrist but I encouraged him to follow-up with his [facility] psychiatrist in the meantime.  He will look into counseling through the [facility] vet center.  He does not have the Abilify that he was taking in the hospital.  Still having difficulty sleeping.  Will add Seroquel for now until seeing psychiatry.  Instructed to contact clinic if having new or worsening symptoms.

## 2023-06-05 NOTE — Assessment & Plan Note (Signed)
Will add patches.

## 2023-06-08 ENCOUNTER — Other Ambulatory Visit (HOSPITAL_BASED_OUTPATIENT_CLINIC_OR_DEPARTMENT_OTHER): Payer: Self-pay

## 2023-06-09 ENCOUNTER — Encounter: Payer: Self-pay | Admitting: Family Medicine

## 2023-06-16 DIAGNOSIS — F419 Anxiety disorder, unspecified: Secondary | ICD-10-CM | POA: Diagnosis not present

## 2023-06-19 ENCOUNTER — Observation Stay (HOSPITAL_BASED_OUTPATIENT_CLINIC_OR_DEPARTMENT_OTHER)
Admission: EM | Admit: 2023-06-19 | Discharge: 2023-06-21 | Disposition: A | Discharge status: Home / Self Care | Payer: No Typology Code available for payment source | Attending: Family Medicine | Admitting: Family Medicine

## 2023-06-19 ENCOUNTER — Other Ambulatory Visit: Payer: Self-pay

## 2023-06-19 ENCOUNTER — Encounter (HOSPITAL_BASED_OUTPATIENT_CLINIC_OR_DEPARTMENT_OTHER): Payer: Self-pay | Admitting: Emergency Medicine

## 2023-06-19 ENCOUNTER — Emergency Department (HOSPITAL_BASED_OUTPATIENT_CLINIC_OR_DEPARTMENT_OTHER): Payer: No Typology Code available for payment source

## 2023-06-19 DIAGNOSIS — D61818 Other pancytopenia: Secondary | ICD-10-CM | POA: Diagnosis present

## 2023-06-19 DIAGNOSIS — E872 Acidosis, unspecified: Secondary | ICD-10-CM | POA: Insufficient documentation

## 2023-06-19 DIAGNOSIS — R7402 Elevation of levels of lactic acid dehydrogenase (LDH): Secondary | ICD-10-CM | POA: Insufficient documentation

## 2023-06-19 DIAGNOSIS — R509 Fever, unspecified: Secondary | ICD-10-CM | POA: Diagnosis present

## 2023-06-19 DIAGNOSIS — E876 Hypokalemia: Secondary | ICD-10-CM | POA: Diagnosis present

## 2023-06-19 DIAGNOSIS — K7031 Alcoholic cirrhosis of liver with ascites: Secondary | ICD-10-CM | POA: Diagnosis present

## 2023-06-19 DIAGNOSIS — F17290 Nicotine dependence, other tobacco product, uncomplicated: Secondary | ICD-10-CM | POA: Insufficient documentation

## 2023-06-19 DIAGNOSIS — Z79899 Other long term (current) drug therapy: Secondary | ICD-10-CM | POA: Insufficient documentation

## 2023-06-19 DIAGNOSIS — D696 Thrombocytopenia, unspecified: Secondary | ICD-10-CM | POA: Diagnosis not present

## 2023-06-19 DIAGNOSIS — R6510 Systemic inflammatory response syndrome (SIRS) of non-infectious origin without acute organ dysfunction: Secondary | ICD-10-CM | POA: Diagnosis not present

## 2023-06-19 DIAGNOSIS — Z91010 Allergy to peanuts: Secondary | ICD-10-CM | POA: Diagnosis not present

## 2023-06-19 DIAGNOSIS — A419 Sepsis, unspecified organism: Secondary | ICD-10-CM | POA: Insufficient documentation

## 2023-06-19 DIAGNOSIS — N390 Urinary tract infection, site not specified: Secondary | ICD-10-CM | POA: Diagnosis not present

## 2023-06-19 DIAGNOSIS — Z1152 Encounter for screening for COVID-19: Secondary | ICD-10-CM | POA: Insufficient documentation

## 2023-06-19 DIAGNOSIS — R7989 Other specified abnormal findings of blood chemistry: Secondary | ICD-10-CM

## 2023-06-19 DIAGNOSIS — D649 Anemia, unspecified: Secondary | ICD-10-CM | POA: Insufficient documentation

## 2023-06-19 LAB — PROTIME-INR
INR: 1.7 — ABNORMAL HIGH (ref 0.8–1.2)
Prothrombin Time: 19.7 s — ABNORMAL HIGH (ref 11.4–15.2)

## 2023-06-19 LAB — LACTIC ACID, PLASMA
Lactic Acid, Venous: 4.3 mmol/L (ref 0.5–1.9)
Lactic Acid, Venous: 3.3 mmol/L (ref 0.5–1.9)

## 2023-06-19 LAB — URINALYSIS, W/ REFLEX TO CULTURE (INFECTION SUSPECTED)
Bilirubin Urine: NEGATIVE
Glucose, UA: NEGATIVE mg/dL
Hgb urine dipstick: NEGATIVE
Ketones, ur: NEGATIVE mg/dL
Nitrite: NEGATIVE
Protein, ur: NEGATIVE mg/dL
Specific Gravity, Urine: 1.02 (ref 1.005–1.030)
pH: 7 (ref 5.0–8.0)

## 2023-06-19 LAB — COMPREHENSIVE METABOLIC PANEL
ALT: 25 U/L (ref 0–44)
AST: 66 U/L — ABNORMAL HIGH (ref 15–41)
Albumin: 2.6 g/dL — ABNORMAL LOW (ref 3.5–5.0)
Alkaline Phosphatase: 73 U/L (ref 38–126)
Anion gap: 13 (ref 5–15)
BUN: 11 mg/dL (ref 6–20)
CO2: 21 mmol/L — ABNORMAL LOW (ref 22–32)
Calcium: 8.3 mg/dL — ABNORMAL LOW (ref 8.9–10.3)
Chloride: 102 mmol/L (ref 98–111)
Creatinine, Ser: 0.89 mg/dL (ref 0.61–1.24)
GFR, Estimated: >60 mL/min (ref >60)
Glucose, Bld: 86 mg/dL (ref 70–99)
Potassium: 3.4 mmol/L — ABNORMAL LOW (ref 3.5–5.1)
Sodium: 136 mmol/L (ref 135–145)
Total Bilirubin: 4.1 mg/dL — ABNORMAL HIGH (ref 0.3–1.2)
Total Protein: 5.9 g/dL — ABNORMAL LOW (ref 6.5–8.1)

## 2023-06-19 LAB — CBC WITH DIFFERENTIAL/PLATELET
Abs Immature Granulocytes: 0.02 10*3/uL (ref 0.00–0.07)
Basophils Absolute: 0 10*3/uL (ref 0.0–0.1)
Basophils Relative: 1 %
Eosinophils Absolute: 0 10*3/uL (ref 0.0–0.5)
Eosinophils Relative: 0 %
HCT: 25.8 % — ABNORMAL LOW (ref 39.0–52.0)
Hemoglobin: 8.4 g/dL — ABNORMAL LOW (ref 13.0–17.0)
Immature Granulocytes: 0 %
Lymphocytes Relative: 19 %
Lymphs Abs: 0.9 10*3/uL (ref 0.7–4.0)
MCH: 24.9 pg — ABNORMAL LOW (ref 26.0–34.0)
MCHC: 32.6 g/dL (ref 30.0–36.0)
MCV: 76.6 fL — ABNORMAL LOW (ref 80.0–100.0)
Monocytes Absolute: 0.6 10*3/uL (ref 0.1–1.0)
Monocytes Relative: 12 %
Neutro Abs: 3.1 10*3/uL (ref 1.7–7.7)
Neutrophils Relative %: 68 %
Platelets: 52 10*3/uL — ABNORMAL LOW (ref 150–400)
RBC: 3.37 MIL/uL — ABNORMAL LOW (ref 4.22–5.81)
RDW: 24.7 % — ABNORMAL HIGH (ref 11.5–15.5)
WBC: 4.6 10*3/uL (ref 4.0–10.5)
nRBC: 0 % (ref 0.0–0.2)

## 2023-06-19 LAB — RESP PANEL BY RT-PCR (RSV, FLU A&B, COVID)  RVPGX2
Influenza A by PCR: NEGATIVE
Influenza B by PCR: NEGATIVE
Resp Syncytial Virus by PCR: NEGATIVE
SARS Coronavirus 2 by RT PCR: NEGATIVE

## 2023-06-19 LAB — AMMONIA: Ammonia: 54 umol/L — ABNORMAL HIGH (ref 9–35)

## 2023-06-19 LAB — APTT: aPTT: 38 s — ABNORMAL HIGH (ref 24–36)

## 2023-06-19 MED ORDER — POTASSIUM CHLORIDE CRYS ER 20 MEQ PO TBCR
20.0000 meq | EXTENDED_RELEASE_TABLET | Freq: Two times a day (BID) | ORAL | Status: DC
  Administered 2023-06-20 – 2023-06-21 (×3): 20 meq via ORAL
  Filled 2023-06-19 (×2): qty 1

## 2023-06-19 MED ORDER — BUSPIRONE HCL 5 MG PO TABS
15.0000 mg | ORAL_TABLET | Freq: Two times a day (BID) | ORAL | Status: DC
  Administered 2023-06-19 – 2023-06-21 (×5): 15 mg via ORAL
  Filled 2023-06-19 (×5): qty 3

## 2023-06-19 MED ORDER — TRAZODONE HCL 50 MG PO TABS: 25.0000 mg | ORAL_TABLET | Freq: Every evening | ORAL | Status: DC | PRN

## 2023-06-19 MED ORDER — SODIUM CHLORIDE 0.9 % IV SOLN
2.0000 g | INTRAVENOUS | Status: DC
  Administered 2023-06-19 – 2023-06-20 (×2): 2 g via INTRAVENOUS
  Filled 2023-06-19 (×2): qty 20

## 2023-06-19 MED ORDER — PIPERACILLIN-TAZOBACTAM 3.375 G IVPB: 3.3750 g | Freq: Three times a day (TID) | INTRAVENOUS | Status: DC

## 2023-06-19 MED ORDER — PIPERACILLIN-TAZOBACTAM 3.375 G IVPB 30 MIN
3.3750 g | Freq: Once | INTRAVENOUS | Status: AC
  Administered 2023-06-19: 3.375 g via INTRAVENOUS
  Filled 2023-06-19: qty 50

## 2023-06-19 MED ORDER — FUROSEMIDE 40 MG PO TABS: 40.0000 mg | ORAL_TABLET | Freq: Every day | ORAL | Status: DC | PRN

## 2023-06-19 MED ORDER — SODIUM CHLORIDE 0.9 % IV SOLN
INTRAVENOUS | Status: AC | PRN
Start: 2023-06-19 — End: 2023-06-20
  Administered 2023-06-19: 10 mL/h via INTRAVENOUS

## 2023-06-19 MED ORDER — POTASSIUM CHLORIDE CRYS ER 20 MEQ PO TBCR
20.0000 meq | EXTENDED_RELEASE_TABLET | Freq: Two times a day (BID) | ORAL | Status: DC
  Administered 2023-06-19 (×2): 20 meq via ORAL
  Filled 2023-06-19 (×2): qty 1

## 2023-06-19 MED ORDER — IBUPROFEN 200 MG PO TABS
600.0000 mg | ORAL_TABLET | Freq: Four times a day (QID) | ORAL | Status: DC | PRN
  Administered 2023-06-19: 600 mg via ORAL
  Filled 2023-06-19: qty 3

## 2023-06-19 MED ORDER — HYDROXYZINE HCL 25 MG PO TABS
12.5000 mg | ORAL_TABLET | Freq: Three times a day (TID) | ORAL | Status: DC | PRN
Start: 2023-06-19 — End: 2023-06-21
  Administered 2023-06-19: 25 mg via ORAL
  Filled 2023-06-19 (×2): qty 1

## 2023-06-19 MED ORDER — ACETAMINOPHEN 325 MG PO TABS: 650.0000 mg | ORAL_TABLET | Freq: Four times a day (QID) | ORAL | Status: DC | PRN

## 2023-06-19 MED ORDER — QUETIAPINE FUMARATE 25 MG PO TABS
25.0000 mg | ORAL_TABLET | Freq: Every day | ORAL | Status: DC
  Administered 2023-06-19 – 2023-06-20 (×2): 25 mg via ORAL
  Filled 2023-06-19 (×2): qty 1

## 2023-06-19 MED ORDER — PANTOPRAZOLE SODIUM 40 MG PO TBEC
40.0000 mg | DELAYED_RELEASE_TABLET | Freq: Every day | ORAL | Status: DC
  Administered 2023-06-19 – 2023-06-21 (×3): 40 mg via ORAL
  Filled 2023-06-19 (×3): qty 1

## 2023-06-19 MED ORDER — SODIUM CHLORIDE 0.9 % IV BOLUS
500.0000 mL | Freq: Once | INTRAVENOUS | Status: AC
  Administered 2023-06-19: 500 mL via INTRAVENOUS

## 2023-06-19 MED ORDER — ONDANSETRON HCL 4 MG/2ML IJ SOLN
4.0000 mg | Freq: Once | INTRAMUSCULAR | Status: AC
  Administered 2023-06-19: 4 mg via INTRAVENOUS
  Filled 2023-06-19: qty 2

## 2023-06-19 MED ORDER — ONDANSETRON HCL 4 MG/2ML IJ SOLN: 4.0000 mg | Freq: Once | INTRAMUSCULAR | Status: DC

## 2023-06-19 NOTE — Plan of Care (Signed)
  Problem: Health Behavior/Discharge Planning: Goal: Ability to manage health-related needs will improve Outcome: Progressing   Problem: Activity: Goal: Risk for activity intolerance will decrease Outcome: Progressing   Problem: Nutrition: Goal: Adequate nutrition will be maintained Outcome: Progressing   Problem: Coping: Goal: Level of anxiety will decrease Outcome: Progressing   Problem: Elimination: Goal: Will not experience complications related to urinary retention Outcome: Progressing   Problem: Pain Management: Goal: General experience of comfort will improve Outcome: Progressing

## 2023-06-19 NOTE — Plan of Care (Signed)

## 2023-06-19 NOTE — ED Notes (Signed)
Pt aware of need for urine sample.  

## 2023-06-19 NOTE — H&P (Signed)
History and Physical  Juan [name] ZDG:[id number] DOB: 1966/01/13 DOA: 06/19/2023  PCP: Everrett Coombe, DO   Chief Complaint: Fever  HPI: Juan [name] is a 57 y.o. male with medical history significant for alcoholic liver cirrhosis, depression, GERD, alcoholic myopathy being admitted to the hospital with fever.  Patient states he was in his usual state of health until he started having a fever last evening.  He has been feeling unwell and generally fatigued for the last 24 hours prior to presentation to the emergency department early this morning.  At home he had a fever of 103.  He states that he does have some abdominal swelling and bilateral lower extremity edema, but this is within the normal level of fluctuation for which she takes as needed Lasix.  No known sick contacts, has been taking his prescribed medications.  Denies any cough or shortness of breath, dysuria, urgency or frequency of urination.  No abdominal pain, nontender to palpation.  No diarrhea, nausea or vomiting.  ED Course: Blood and urine cultures were obtained in the emergency department, he was started on empiric IV antibiotics.  Respiratory panel was negative.  Admitted for observation to the progressive unit at [facility] for further treatment of his fever.  Review of Systems: Please see HPI for pertinent positives and negatives. A complete 10 system review of systems are otherwise negative.  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   Chronic fatigue    Cirrhosis (HCC)    Colon polyps    Depression    GERD (gastroesophageal reflux disease)    Hemochromatosis    Hiatal hernia 02/20/2022   Hx of blood clots    Leg   Hyperreflexia    Hypertension    PTSD (post-traumatic stress disorder)    Traumatic hemorrhagic shock [facility])    Past Surgical History:   Procedure Laterality Date   BIOPSY  09/24/2021   Procedure: BIOPSY;  Surgeon: Imogene Burn, MD;  Location: [facility] ENDOSCOPY;  Service: Gastroenterology;;   Fidela Salisbury RELEASE Bilateral    COLONOSCOPY WITH PROPOFOL N/A 09/24/2021   Procedure: COLONOSCOPY WITH PROPOFOL;  Surgeon: Imogene Burn, MD;  Location: [facility] ENDOSCOPY;  Service: Gastroenterology;  Laterality: N/A;   ESOPHAGOGASTRODUODENOSCOPY (EGD) WITH PROPOFOL N/A 09/24/2021   Procedure: ESOPHAGOGASTRODUODENOSCOPY (EGD) WITH PROPOFOL;  Surgeon: Imogene Burn, MD;  Location: [facility] ENDOSCOPY;  Service: Gastroenterology;  Laterality: N/A;   ESOPHAGOGASTRODUODENOSCOPY (EGD) WITH PROPOFOL N/A 08/18/2022   Procedure: ESOPHAGOGASTRODUODENOSCOPY (EGD) WITH PROPOFOL;  Surgeon: Hilarie Fredrickson, MD;  Location: [facility] ENDOSCOPY;  Service: Gastroenterology;  Laterality: N/A;   FLEXIBLE SIGMOIDOSCOPY N/A 08/18/2022   Procedure: FLEXIBLE SIGMOIDOSCOPY;  Surgeon: Hilarie Fredrickson, MD;  Location: [facility] ENDOSCOPY;  Service: Gastroenterology;  Laterality: N/A;   FRACTURE SURGERY     left ankle plate   HERNIA REPAIR     inguinal   KNEE SURGERY Right    x 4   SHOULDER SURGERY Bilateral    x 2   VASECTOMY      Social History:  reports that he has never smoked. His smokeless tobacco use includes snuff. He reports that he does not currently use alcohol. He reports that he does not use drugs.   Allergies  Allergen Reactions   Apple Juice Swelling and Other (See Comments)    Tongue swelling   Cucumber Extract Itching and Nausea  And Vomiting    No extracts; just cucumber   Depakote Er [Divalproex Sodium Er] Swelling and Other (See Comments)    Tongue swelling   Depakote [Valproic Acid] Anaphylaxis   Peanut Butter Flavor Anaphylaxis and Swelling   Peanut Oil Swelling   Peanut-Containing Drug Products Swelling   Shellfish Allergy Itching and Swelling   Shrimp Extract Itching and Swelling   Strawberry Extract Nausea And Vomiting and Swelling   Apple Swelling    Cantaloupe Extract Allergy Skin Test Rash   Codeine Itching, Rash and Other (See Comments)    Patient reports he can take "CODONES" without problems   Firvanq [Vancomycin] Rash   Lactose Intolerance (Gi) Diarrhea and Other (See Comments)    Indigestion, Stomach pain, Flatulence    Family History  Problem Relation Age of Onset   Pulmonary fibrosis Mother    Hypertension Father    Other Father        liver failure   Diabetes Brother    Breast cancer Paternal Aunt    Colon cancer Neg Hx    Esophageal cancer Neg Hx    Stomach cancer Neg Hx    Rectal cancer Neg Hx      Prior to Admission medications   Medication Sig Start Date End Date Taking? Authorizing Provider  busPIRone (BUSPAR) 15 MG tablet Take 1 tablet (15 mg total) by mouth 2 (two) times daily. 11/18/22   Everrett Coombe, DO  diazepam (VALIUM) 10 MG tablet Take 1 tablet by mouth as directed; (take 1 tablet by mouth 2 hrs before appointment and bring the rest with you) 05/20/23     diclofenac Sodium (VOLTAREN) 1 % GEL Apply 4 grams topically 4 (four) times daily to affected joint. 02/08/23   Monica Becton, MD  furosemide (LASIX) 40 MG tablet Take 1 tablet (40 mg) by mouth daily. Patient taking differently: Take 40 mg by mouth as needed for edema. 01/20/23 05/22/23  Rai, Delene Ruffini, MD  hydrOXYzine (ATARAX) 25 MG tablet Take 0.5-1 tablets (12.5-25 mg total) by mouth every 8 (eight) hours as needed for itching. 10/19/22   Everrett Coombe, DO  lactulose, encephalopathy, (CHRONULAC) 10 GM/15ML SOLN Take 10 g by mouth daily as needed (constipation).    [provider]  magnesium oxide (MAG-OX) 400 MG tablet Take 1 tablet (400 mg total) by mouth 2 (two) times daily. 05/13/23   Everrett Coombe, DO  nicotine (NICODERM CQ - DOSED IN MG/24 HOURS) 21 mg/24hr patch Apply patch to non-hairy, clean, dry skin on upper body between the neck and waist. Remove the previous day's patch before applying new patch. Rotate or switch site where  apply daily. If you have intense, clear, troubling dreams or your cannot fall asleep only wear patch 16 hrs per day with removal at bedtime. 06/03/23     ondansetron (ZOFRAN-ODT) 8 MG disintegrating tablet Dissolve 1 tablet (8 mg total) by mouth every 8 (eight) hours as needed for nausea. 01/20/23   Rai, Ripudeep K, MD  pantoprazole (PROTONIX) 40 MG tablet Take 1 tablet (40 mg total) by mouth daily. 11/18/22 11/18/23  Imogene Burn, MD  potassium chloride SA (KLOR-CON M) 20 MEQ tablet Take 1 tablet (20 mEq total) by mouth 2 (two) times daily. 05/13/23 06/12/23  Everrett Coombe, DO  QUEtiapine (SEROQUEL) 50 MG tablet Take 0.5-1 tablets (25-50 mg total) by mouth at bedtime. 06/02/23   Everrett Coombe, DO  saccharomyces boulardii (FLORASTOR) 250 MG capsule Take 250 mg by mouth 2 (two) times daily.  [provider]  vortioxetine HBr (TRINTELLIX) 10 MG TABS tablet Take 1 tablet (10 mg total) by mouth daily. 11/22/22   Everrett Coombe, DO    Physical Exam: BP 129/73 (BP Location: Right Arm)   Pulse 73   Temp 98.8 F (37.1 C) (Oral)   Resp 18   Ht 5\' 5"  (1.651 m)   Wt 73.9 kg   SpO2 100%   BMI 27.12 kg/m   General:  Alert, oriented, calm, looks well-developed and well-nourished, looks slightly uncomfortable wrapped up in blankets.  Looks nontoxic. Eyes: EOMI, clear conjuctivae, white sclerea Neck: supple, no masses, trachea mildline  Cardiovascular: RRR, no murmurs or rubs, he has 1-2+ pitting edema in the bilateral lower extremities Respiratory: clear to auscultation bilaterally, no wheezes, no crackles  Abdomen: soft, nontender, minimally distended, normal bowel tones heard  Skin: dry, no rashes  Musculoskeletal: no joint effusions, normal range of motion  Psychiatric: appropriate affect, normal speech  Neurologic: extraocular muscles intact, clear speech, moving all extremities with intact sensorium         Labs on Admission:  Basic Metabolic Panel: Recent Labs  Lab 06/19/23 0414   NA 136  K 3.4*  CL 102  CO2 21*  GLUCOSE 86  BUN 11  CREATININE 0.89  CALCIUM 8.3*   Liver Function Tests: Recent Labs  Lab 06/19/23 0414  AST 66*  ALT 25  ALKPHOS 73  BILITOT 4.1*  PROT 5.9*  ALBUMIN 2.6*   No results for input(s): "LIPASE", "AMYLASE" in the last 168 hours. Recent Labs  Lab 06/19/23 0415  AMMONIA 54*   CBC: Recent Labs  Lab 06/19/23 0414  WBC 4.6  NEUTROABS 3.1  HGB 8.4*  HCT 25.8*  MCV 76.6*  PLT 52*   Cardiac Enzymes: No results for input(s): "CKTOTAL", "CKMB", "CKMBINDEX", "TROPONINI" in the last 168 hours.  BNP (last 3 results) No results for input(s): "BNP" in the last 8760 hours.  ProBNP (last 3 results) No results for input(s): "PROBNP" in the last 8760 hours.  CBG: No results for input(s): "GLUCAP" in the last 168 hours.  Radiological Exams on Admission: DG Chest Port 1 View  Result Date: 06/19/2023 CLINICAL DATA:  57 year old male with possible sepsis. Fever to 103 degrees. EXAM: PORTABLE CHEST 1 VIEW COMPARISON:  Chest x-ray 04/03/2023. FINDINGS: Lung volumes are low. No confluent consolidative airspace disease. Some patchy areas of mild interstitial prominence and peribronchial cuffing are noted in the lungs bilaterally. No pneumothorax. No pleural effusions. No evidence of pulmonary edema. Heart size is normal. The patient is rotated to the left on today's exam, resulting in distortion of the mediastinal contours and reduced diagnostic sensitivity and specificity for mediastinal pathology. IMPRESSION: 1. Mild diffuse bronchial wall thickening and patchy peribronchial cuffing concerning for acute bronchitis. Electronically Signed   By: Trudie Reed M.D.   On: 06/19/2023 05:04    Assessment/Plan Juan [name] is a 57 y.o. male with medical history significant for alcoholic liver cirrhosis, depression, GERD, alcoholic myopathy being admitted to the hospital with fever.   Fever-currently unclear etiology, low suspicion for  UTI due to lack of urinary symptoms and unimpressive urinalysis.  SBP unlikely given lack of abdominal pain.  He has no leukocytosis.  Not meeting SIRS criteria.  I considered meningitis, but this is incredibly unlikely given no confusion, no nuchal rigidity. -Observation admission -Continue empiric IV Rocephin which would cover for possible SBP, UTI, etc. -Follow-up blood and urine cultures  History of liver cirrhosis-appears compensated  Lactic acidosis-I  suspect this is due to his chronic liver disease, and not indicative of sepsis or poor peripheral perfusion.  Chronic anemia and thrombocytopenia-due to his known liver disease  Chronic hypokalemia-continue oral potassium supplementation  GERD-Protonix  Depression-BuSpar  DVT prophylaxis: SCDs only    Code Status: Full Code  Consults called: None  Admission status: Observation  Time spent: 49 minutes  Tonna Palazzi Sharlette Dense MD [facility] Pager [phone]  If 7PM-7AM, please contact night-coverage [email or web address] Password [email or web address]  06/19/2023, 12:01 PM

## 2023-06-19 NOTE — ED Triage Notes (Signed)
Wife reports fever 103 at home. Edema in BIL LE.  He also reports HA and body aches that all started today.

## 2023-06-19 NOTE — ED Notes (Signed)
[facility] called for transport. 

## 2023-06-19 NOTE — ED Provider Notes (Signed)
[facility] Provider Note   CSN: [id number] Arrival date & time: 06/19/23  0325     History  Chief Complaint  Patient presents with   Fever    Juan [name] is a 57 y.o. male.  The history is provided by the patient and the spouse.  Fever Juan [name] is a 57 y.o. male who presents to the Emergency Department complaining of fever.  He presents emergency department accompanied by his wife for evaluation of fever that started at 7 PM.  He has been feeling unwell and fatigued for the last 24 hours.  Overnight he developed a fever to 103 and his wife noticed that he was breathing faster than baseline.  He has associated headache, nausea.  He also reports increased bilateral lower extremity edema over the last 24 hours.  No chest pain, abdominal pain, vomiting.  No known sick contacts.  He has a history of cirrhosis.  He received acetaminophen  prior to ED arrival.       Home Medications Prior to Admission medications   Medication Sig Start Date End Date Taking? Authorizing Provider  busPIRone (BUSPAR) 15 MG tablet Take 1 tablet (15 mg total) by mouth 2 (two) times daily. 11/18/22   Everrett Coombe, DO  diazepam (VALIUM) 10 MG tablet Take 1 tablet by mouth as directed; (take 1 tablet by mouth 2 hrs before appointment and bring the rest with you) 05/20/23     diclofenac Sodium (VOLTAREN) 1 % GEL Apply 4 grams topically 4 (four) times daily to affected joint. 02/08/23   Monica Becton, MD  furosemide (LASIX) 40 MG tablet Take 1 tablet (40 mg) by mouth daily. Patient taking differently: Take 40 mg by mouth as needed for edema. 01/20/23 05/22/23  Rai, Delene Ruffini, MD  hydrOXYzine (ATARAX) 25 MG tablet Take 0.5-1 tablets (12.5-25 mg total) by mouth every 8 (eight) hours as needed for itching. 10/19/22   Everrett Coombe, DO  lactulose, encephalopathy, (CHRONULAC) 10 GM/15ML SOLN Take 10 g by mouth daily as needed (constipation).    [provider]  magnesium oxide (MAG-OX) 400 MG tablet Take 1 tablet (400 mg total) by mouth 2 (two) times daily. 05/13/23   Everrett Coombe, DO  nicotine (NICODERM CQ - DOSED IN MG/24 HOURS) 21 mg/24hr patch Apply patch to non-hairy, clean, dry skin on upper body between the neck and waist. Remove the previous day's patch before applying new patch. Rotate or switch site where apply daily. If you have intense, clear, troubling dreams or your cannot fall asleep only wear patch 16 hrs per day with removal at bedtime. 06/03/23     ondansetron (ZOFRAN-ODT) 8 MG disintegrating tablet Dissolve 1 tablet (8 mg total) by mouth every 8 (eight) hours as needed for nausea. 01/20/23   Rai, Ripudeep K, MD  pantoprazole (PROTONIX) 40 MG tablet Take 1 tablet (40 mg total) by mouth daily. 11/18/22 11/18/23  Imogene Burn, MD  potassium chloride SA (KLOR-CON M) 20 MEQ tablet Take 1 tablet (20 mEq total) by mouth 2 (two) times daily. 05/13/23 06/12/23  Everrett Coombe, DO  QUEtiapine (SEROQUEL) 50 MG tablet Take 0.5-1 tablets (25-50 mg total) by mouth at bedtime. 06/02/23   Everrett Coombe, DO  saccharomyces boulardii (FLORASTOR) 250 MG capsule Take 250 mg by mouth 2 (two) times daily.     [provider]  vortioxetine HBr (TRINTELLIX) 10 MG TABS tablet Take 1 tablet (10 mg total) by mouth daily. 11/22/22  Everrett Coombe, DO      Allergies    Apple juice, Cucumber extract, Depakote er [divalproex sodium er], Depakote [valproic acid], Peanut butter flavor, Peanut oil, Peanut-containing drug products, Shellfish allergy, Shrimp extract, Strawberry extract, Apple, Cantaloupe extract allergy skin test, Codeine, Firvanq [vancomycin], and Lactose intolerance (gi)    Review of Systems   Review of Systems  Constitutional:  Positive for fever.  All other systems reviewed and are negative.   Physical Exam Updated Vital Signs BP 136/78 (BP Location: Right Arm)   Pulse 92   Temp 98.4 F (36.9 C) (Oral)   Resp 20   Ht 5'  5" (1.651 m)   Wt 73.9 kg   SpO2 100%   BMI 27.12 kg/m  Physical Exam Vitals and nursing note reviewed.  Constitutional:      Appearance: He is well-developed.  HENT:     Head: Normocephalic and atraumatic.  Cardiovascular:     Rate and Rhythm: Normal rate and regular rhythm.     Heart sounds: No murmur heard. Pulmonary:     Effort: Pulmonary effort is normal. No respiratory distress.     Breath sounds: Normal breath sounds.  Abdominal:     Palpations: Abdomen is soft.     Tenderness: There is no abdominal tenderness. There is no guarding or rebound.  Musculoskeletal:        General: No tenderness.     Comments: Pitting edema to bilateral lower extremities  Skin:    General: Skin is warm and dry.  Neurological:     Mental Status: He is alert and oriented to person, place, and time.  Psychiatric:        Behavior: Behavior normal.     ED Results / Procedures / Treatments   Labs (all labs ordered are listed, but only abnormal results are displayed) Labs Reviewed  CBC WITH DIFFERENTIAL/PLATELET - Abnormal; Notable for the following components:      Result Value   RBC 3.37 (*)    Hemoglobin 8.4 (*)    HCT 25.8 (*)    MCV 76.6 (*)    MCH 24.9 (*)    RDW 24.7 (*)    Platelets 52 (*)    All other components within normal limits  COMPREHENSIVE METABOLIC PANEL - Abnormal; Notable for the following components:   Potassium 3.4 (*)    CO2 21 (*)    Calcium 8.3 (*)    Total Protein 5.9 (*)    Albumin 2.6 (*)    AST 66 (*)    Total Bilirubin 4.1 (*)    All other components within normal limits  LACTIC ACID, PLASMA - Abnormal; Notable for the following components:   Lactic Acid, Venous 4.3 (*)    All other components within normal limits  LACTIC ACID, PLASMA - Abnormal; Notable for the following components:   Lactic Acid, Venous 3.3 (*)    All other components within normal limits  PROTIME-INR - Abnormal; Notable for the following components:   Prothrombin Time 19.7 (*)     INR 1.7 (*)    All other components within normal limits  APTT - Abnormal; Notable for the following components:   aPTT 38 (*)    All other components within normal limits  URINALYSIS, W/ REFLEX TO CULTURE (INFECTION SUSPECTED) - Abnormal; Notable for the following components:   APPearance CLOUDY (*)    Leukocytes,Ua TRACE (*)    Bacteria, UA MANY (*)    All other components within normal limits  AMMONIA -  Abnormal; Notable for the following components:   Ammonia 54 (*)    All other components within normal limits  RESP PANEL BY RT-PCR (RSV, FLU A&B, COVID)  RVPGX2  CULTURE, BLOOD (ROUTINE X 2)  CULTURE, BLOOD (ROUTINE X 2)  URINE CULTURE    EKG EKG Interpretation Date/Time:  Sunday June 19 2023 04:31:09 EDT Ventricular Rate:  83 PR Interval:  124 QRS Duration:  85 QT Interval:  408 QTC Calculation: 480 R Axis:   26  Text Interpretation: Ectopic atrial rhythm Low voltage, precordial leads Borderline prolonged QT interval Confirmed by Tilden Fossa [phone]) on 06/19/2023 4:33:59 AM  Radiology DG Chest Port 1 View  Result Date: 06/19/2023 CLINICAL DATA:  57 year old male with possible sepsis. Fever to 103 degrees. EXAM: PORTABLE CHEST 1 VIEW COMPARISON:  Chest x-ray 04/03/2023. FINDINGS: Lung volumes are low. No confluent consolidative airspace disease. Some patchy areas of mild interstitial prominence and peribronchial cuffing are noted in the lungs bilaterally. No pneumothorax. No pleural effusions. No evidence of pulmonary edema. Heart size is normal. The patient is rotated to the left on today's exam, resulting in distortion of the mediastinal contours and reduced diagnostic sensitivity and specificity for mediastinal pathology. IMPRESSION: 1. Mild diffuse bronchial wall thickening and patchy peribronchial cuffing concerning for acute bronchitis. Electronically Signed   By: Trudie Reed M.D.   On: 06/19/2023 05:04    Procedures Procedures    Medications Ordered  in ED Medications  0.9 %  sodium chloride infusion (10 mL/hr Intravenous New Bag/Given 06/19/23 0601)  sodium chloride 0.9 % bolus 500 mL (500 mLs Intravenous New Bag/Given 06/19/23 0606)  piperacillin-tazobactam (ZOSYN) IVPB 3.375 g (3.375 g Intravenous New Bag/Given 06/19/23 0602)    ED Course/ Medical Decision Making/ A&P                                 Medical Decision Making Amount and/or Complexity of Data Reviewed Labs: ordered. Radiology: ordered. ECG/medicine tests: ordered.  Risk Prescription drug management. Decision regarding hospitalization.   Patient with history of cirrhosis here for evaluation of fever and bodyaches that started yesterday.  He is nontoxic-appearing on evaluation with no respiratory distress.  He does have pitting edema on evaluation.  There is no abdominal tenderness.  Current clinical picture is not consistent with meningitis, SBP.  Chest x-ray with possible bronchitis.  He does have shortness of breath without significant cough.  Labs with stable anemia, thrombocytopenia.  He has stable elevation in his INR.  His lactic acid is elevated, this is increased when compared to baseline.  He was treated with small IV fluid bolus given his normal blood pressure and overall volume overload appearance.  Unclear if his elevated lactate is secondary to infection versus underlying liver disease.  He was treated empirically with antibiotics.  Currently meets SIRS criteria.  After initiation of antibiotics urine resulted back and is consistent with UTI.  Suspect this is driving his symptoms.  Medicine consulted for admission for ongoing care.  Patient and wife updated findings of studies and recommendation for admission and they are in agreement with treatment plan.       Final Clinical Impression(s) / ED Diagnoses Final diagnoses:  Febrile illness, acute  Elevated lactic acid level  Acute UTI    Rx / DC Orders ED Discharge Orders     None          Tilden Fossa, MD 06/19/23 [phone]

## 2023-06-19 NOTE — ED Notes (Signed)
Attempted to call report , receiving nurse unavailable . Reported will call when she can .

## 2023-06-20 DIAGNOSIS — A419 Sepsis, unspecified organism: Secondary | ICD-10-CM | POA: Insufficient documentation

## 2023-06-20 DIAGNOSIS — K7031 Alcoholic cirrhosis of liver with ascites: Secondary | ICD-10-CM | POA: Diagnosis not present

## 2023-06-20 DIAGNOSIS — R509 Fever, unspecified: Secondary | ICD-10-CM

## 2023-06-20 DIAGNOSIS — D696 Thrombocytopenia, unspecified: Secondary | ICD-10-CM

## 2023-06-20 DIAGNOSIS — E876 Hypokalemia: Secondary | ICD-10-CM

## 2023-06-20 DIAGNOSIS — D61818 Other pancytopenia: Secondary | ICD-10-CM

## 2023-06-20 HISTORY — DX: Fever, unspecified: R50.9

## 2023-06-20 LAB — CBC WITH DIFFERENTIAL/PLATELET
Abs Immature Granulocytes: 0.01 10*3/uL (ref 0.00–0.07)
Basophils Absolute: 0 10*3/uL (ref 0.0–0.1)
Basophils Relative: 1 %
Eosinophils Absolute: 0.4 10*3/uL (ref 0.0–0.5)
Eosinophils Relative: 9 %
HCT: 24.3 % — ABNORMAL LOW (ref 39.0–52.0)
Hemoglobin: 7.6 g/dL — ABNORMAL LOW (ref 13.0–17.0)
Immature Granulocytes: 0 %
Lymphocytes Relative: 28 %
Lymphs Abs: 1.3 10*3/uL (ref 0.7–4.0)
MCH: 25.2 pg — ABNORMAL LOW (ref 26.0–34.0)
MCHC: 31.3 g/dL (ref 30.0–36.0)
MCV: 80.7 fL (ref 80.0–100.0)
Monocytes Absolute: 0.8 10*3/uL (ref 0.1–1.0)
Monocytes Relative: 16 %
Neutro Abs: 2.2 10*3/uL (ref 1.7–7.7)
Neutrophils Relative %: 46 %
Platelets: 49 10*3/uL — ABNORMAL LOW (ref 150–400)
RBC: 3.01 MIL/uL — ABNORMAL LOW (ref 4.22–5.81)
RDW: 25.5 % — ABNORMAL HIGH (ref 11.5–15.5)
WBC: 4.7 10*3/uL (ref 4.0–10.5)
nRBC: 0 % (ref 0.0–0.2)

## 2023-06-20 LAB — CBC
HCT: 22.2 % — ABNORMAL LOW (ref 39.0–52.0)
Hemoglobin: 7.1 g/dL — ABNORMAL LOW (ref 13.0–17.0)
MCH: 25.4 pg — ABNORMAL LOW (ref 26.0–34.0)
MCHC: 32 g/dL (ref 30.0–36.0)
MCV: 79.6 fL — ABNORMAL LOW (ref 80.0–100.0)
Platelets: 42 10*3/uL — ABNORMAL LOW (ref 150–400)
RBC: 2.79 MIL/uL — ABNORMAL LOW (ref 4.22–5.81)
RDW: 25.1 % — ABNORMAL HIGH (ref 11.5–15.5)
WBC: 3.8 10*3/uL — ABNORMAL LOW (ref 4.0–10.5)
nRBC: 0 % (ref 0.0–0.2)

## 2023-06-20 LAB — COMPREHENSIVE METABOLIC PANEL
ALT: 19 U/L (ref 0–44)
AST: 50 U/L — ABNORMAL HIGH (ref 15–41)
Albumin: 2.3 g/dL — ABNORMAL LOW (ref 3.5–5.0)
Alkaline Phosphatase: 72 U/L (ref 38–126)
Anion gap: 7 (ref 5–15)
BUN: 14 mg/dL (ref 6–20)
CO2: 22 mmol/L (ref 22–32)
Calcium: 7.9 mg/dL — ABNORMAL LOW (ref 8.9–10.3)
Chloride: 113 mmol/L — ABNORMAL HIGH (ref 98–111)
Creatinine, Ser: 0.98 mg/dL (ref 0.61–1.24)
GFR, Estimated: >60 mL/min (ref >60)
Glucose, Bld: 147 mg/dL — ABNORMAL HIGH (ref 70–99)
Potassium: 3.1 mmol/L — ABNORMAL LOW (ref 3.5–5.1)
Sodium: 142 mmol/L (ref 135–145)
Total Bilirubin: 2.3 mg/dL — ABNORMAL HIGH (ref 0.3–1.2)
Total Protein: 5.1 g/dL — ABNORMAL LOW (ref 6.5–8.1)

## 2023-06-20 LAB — RESPIRATORY PANEL BY PCR

## 2023-06-20 MED ORDER — CYCLOBENZAPRINE HCL 5 MG PO TABS
5.0000 mg | ORAL_TABLET | Freq: Three times a day (TID) | ORAL | Status: DC | PRN
  Administered 2023-06-20 (×2): 5 mg via ORAL
  Filled 2023-06-20 (×2): qty 1

## 2023-06-20 MED ORDER — ALUM & MAG HYDROXIDE-SIMETH 200-200-20 MG/5ML PO SUSP
30.0000 mL | ORAL | Status: DC | PRN
  Administered 2023-06-20: 30 mL via ORAL
  Filled 2023-06-20: qty 30

## 2023-06-20 MED ORDER — LACTULOSE 10 GM/15ML PO SOLN: 10.0000 g | Freq: Every day | ORAL | Status: DC | PRN

## 2023-06-20 MED ORDER — MAGNESIUM OXIDE -MG SUPPLEMENT 400 (240 MG) MG PO TABS
400.0000 mg | ORAL_TABLET | Freq: Two times a day (BID) | ORAL | Status: DC
  Administered 2023-06-20 – 2023-06-21 (×2): 400 mg via ORAL
  Filled 2023-06-20 (×2): qty 1

## 2023-06-20 MED ORDER — POTASSIUM CHLORIDE CRYS ER 20 MEQ PO TBCR
40.0000 meq | EXTENDED_RELEASE_TABLET | ORAL | Status: AC
  Administered 2023-06-20 (×2): 40 meq via ORAL
  Filled 2023-06-20 (×2): qty 2

## 2023-06-20 MED ORDER — NICOTINE 21 MG/24HR TD PT24
21.0000 mg | MEDICATED_PATCH | Freq: Every day | TRANSDERMAL | Status: DC
Start: 2023-06-20 — End: 2023-06-21
  Administered 2023-06-20 – 2023-06-21 (×2): 21 mg via TRANSDERMAL
  Filled 2023-06-20 (×2): qty 1

## 2023-06-20 MED ORDER — VORTIOXETINE HBR 5 MG PO TABS
10.0000 mg | ORAL_TABLET | Freq: Every day | ORAL | Status: DC
  Administered 2023-06-20 – 2023-06-21 (×2): 10 mg via ORAL
  Filled 2023-06-20 (×2): qty 2

## 2023-06-20 NOTE — Discharge Summary (Signed)
Physician Discharge Summary  Juan [name] MWU:[id number] DOB: Jan 04, 1966 DOA: 06/19/2023  PCP: Everrett Coombe, DO  Admit date: 06/19/2023 Discharge date: 06/21/2023 30 Day Unplanned Readmission Risk Score    Flowsheet Row ED to Hosp-Admission (Discharged) from 05/22/2023 in [facility] Emergency Department at [facility]  30 Day Unplanned Readmission Risk Score (%) 27.5 Filed at 05/27/2023 1200       This score is the patient's risk of an unplanned readmission within 30 days of being discharged (0 -100%). The score is based on dignosis, age, lab data, medications, orders, and past utilization.   Low:  0-14.9   Medium: 15-21.9   High: 22-29.9   Extreme: 30 and above          Admitted From: Home Disposition: Home  Recommendations for Outpatient Follow-up:  Follow up with PCP in 1-2 weeks Please obtain BMP/CBC in one week Please follow up with your PCP on the following pending results: Unresulted Labs (From admission, onward)    None         Home Health: None Equipment/Devices: None  Discharge Condition: Stable CODE STATUS: Full code Diet recommendation: Cardiac  Following HPI and ED course is copied from admitting hospitalist H&P. HPI: Juan [name] is a 57 y.o. male with medical history significant for alcoholic liver cirrhosis, depression, GERD, alcoholic myopathy being admitted to the hospital with fever.  Patient states he was in his usual state of health until he started having a fever last evening.  He has been feeling unwell and generally fatigued for the last 24 hours prior to presentation to the emergency department early this morning.  At home he had a fever of 103.  He states that he does have some abdominal swelling and bilateral lower extremity edema, but this is within the normal level of fluctuation for which she takes as needed Lasix.  No known sick contacts, has been taking his prescribed medications.  Denies any cough or shortness of breath,  dysuria, urgency or frequency of urination.  No abdominal pain, nontender to palpation.  No diarrhea, nausea or vomiting.   ED Course: Blood and urine cultures were obtained in the emergency department, he was started on empiric IV antibiotics.  Respiratory panel was negative.  Admitted for observation to the progressive unit at [facility] for further treatment of his fever.  Subjective: Seen and examined.  He has no complaints.  Denies any chest pain, shortness of breath, palpitation, nausea, and vomiting, fever, chills, cough or any other complaint.  Brief/Interim Summary: Patient was admitted to hospitalist service mainly for the fever which actually was noted and documented by patient at home however patient remained afebrile during the whole course of short hospitalization.  No leukocytosis.  Mild lactic acidosis but that is chronic due to his liver disease.  His ascites was at his baseline as well.  Has chronic hypokalemia for which he takes supplements however potassium is normal today. Chest x-ray shows acute bronchitis but patient has no respiratory symptoms and lungs are clear to auscultation and respiratory viral panel negative.  Has anemia of chronic disease, his baseline hemoglobin remains between 8-9.  Hemoglobin appears to have dropped to 7.1 so CBC was repeated and hemoglobin is 7.6.  No reports of melena, hematemesis or hematochezia.  Patient's UA was showing positive leukoesterase and bacteria, not very impressive for the UTI however patient's urine culture grew Streptococcus gallolyticus yesterday and for that reason, we kept him another night for observation.  It is sensitive to Rocephin  and that is what he was getting for last 2 days.  Patient does not have any urinary symptoms.  Nonetheless, we will discharge him on 5 more days of Keflex to complete the course.  Discharge plan was discussed with patient and/or family member and they verbalized understanding and agreed with it.   Discharge Diagnoses:  Active Problems:   Hypokalemia   Thrombocytopenia (HCC)   Pancytopenia (HCC)   Alcoholic cirrhosis of liver with ascites (HCC)   Hyperbilirubinemia   Sepsis secondary to UTI [facility])   Fever    Discharge Instructions   Allergies as of 06/21/2023       Reactions   Apple Juice Swelling, Other (See Comments)   Tongue swelling   Cucumber Extract Itching, Nausea And Vomiting   No extracts; just cucumber   Depakote Er [divalproex Sodium Er] Swelling, Other (See Comments)   Tongue swelling   Depakote [valproic Acid] Anaphylaxis   Peanut Butter Flavor Anaphylaxis, Swelling   Peanut Oil Swelling   Peanut-containing Drug Products Swelling   Shellfish Allergy Itching, Swelling   Shrimp Extract Itching, Swelling   Strawberry Extract Nausea And Vomiting, Swelling   Apple Swelling   Cantaloupe Extract Allergy Skin Test Rash   Codeine Itching, Rash, Other (See Comments)   Patient reports he can take "CODONES" without problems   Firvanq [vancomycin] Rash   Lactose Intolerance (gi) Diarrhea, Other (See Comments)   Indigestion, Stomach pain, Flatulence        Medication List     TAKE these medications    busPIRone 15 MG tablet Commonly known as: BUSPAR Take 1 tablet (15 mg total) by mouth 2 (two) times daily.   cephALEXin 500 MG capsule Commonly known as: KEFLEX Take 1 capsule (500 mg total) by mouth 3 (three) times daily for 5 days.   diazepam 10 MG tablet Commonly known as: VALIUM Take 1 tablet by mouth as directed; (take 1 tablet by mouth 2 hrs before appointment and bring the rest with you)   diclofenac Sodium 1 % Gel Commonly known as: VOLTAREN Apply 4 grams topically 4 (four) times daily to affected joint.   furosemide 40 MG tablet Commonly known as: Lasix Take 1 tablet (40 mg) by mouth daily.   hydrOXYzine 25 MG tablet Commonly known as: ATARAX Take 0.5-1 tablets (12.5-25 mg total) by mouth every 8 (eight) hours as needed for itching.    lactulose (encephalopathy) 10 GM/15ML Soln Commonly known as: CHRONULAC Take 10 g by mouth daily as needed (constipation).   magnesium oxide 400 MG tablet Commonly known as: MAG-OX Take 1 tablet (400 mg total) by mouth 2 (two) times daily.   nicotine 21 mg/24hr patch Commonly known as: NICODERM CQ - dosed in mg/24 hours Apply patch to non-hairy, clean, dry skin on upper body between the neck and waist. Remove the previous day's patch before applying new patch. Rotate or switch site where apply daily. If you have intense, clear, troubling dreams or your cannot fall asleep only wear patch 16 hrs per day with removal at bedtime.   ondansetron 8 MG disintegrating tablet Commonly known as: ZOFRAN-ODT Dissolve 1 tablet (8 mg total) by mouth every 8 (eight) hours as needed for nausea.   pantoprazole 40 MG tablet Commonly known as: PROTONIX Take 1 tablet (40 mg total) by mouth daily.   potassium chloride SA 20 MEQ tablet Commonly known as: KLOR-CON M Take 1 tablet (20 mEq total) by mouth 2 (two) times daily.   QUEtiapine 50 MG tablet Commonly  known as: SEROquel Take 0.5-1 tablets (25-50 mg total) by mouth at bedtime.   saccharomyces boulardii 250 MG capsule Commonly known as: FLORASTOR Take 250 mg by mouth 2 (two) times daily.   Trintellix 10 MG Tabs tablet Generic drug: vortioxetine HBr Take 1 tablet (10 mg total) by mouth daily.        Follow-up Information     Everrett Coombe, DO Follow up in 1 week(s).   Specialty: Family Medicine Contact information: [phone] [facility] [address]  [address] [place] [address] [phone]         Everrett Coombe, DO .   Specialty: Family Medicine Contact information: [address]  [address] [place] [address] [phone]                Allergies  Allergen Reactions   Apple Juice Swelling and Other (See Comments)    Tongue swelling   Cucumber Extract Itching and Nausea And Vomiting    No extracts;  just cucumber   Depakote Er [Divalproex Sodium Er] Swelling and Other (See Comments)    Tongue swelling   Depakote [Valproic Acid] Anaphylaxis   Peanut Butter Flavor Anaphylaxis and Swelling   Peanut Oil Swelling   Peanut-Containing Drug Products Swelling   Shellfish Allergy Itching and Swelling   Shrimp Extract Itching and Swelling   Strawberry Extract Nausea And Vomiting and Swelling   Apple Swelling   Cantaloupe Extract Allergy Skin Test Rash   Codeine Itching, Rash and Other (See Comments)    Patient reports he can take "CODONES" without problems   Firvanq [Vancomycin] Rash   Lactose Intolerance (Gi) Diarrhea and Other (See Comments)    Indigestion, Stomach pain, Flatulence    Consultations: None   Procedures/Studies: DG Chest Port 1 View  Result Date: 06/19/2023 CLINICAL DATA:  57 year old male with possible sepsis. Fever to 103 degrees. EXAM: PORTABLE CHEST 1 VIEW COMPARISON:  Chest x-ray 04/03/2023. FINDINGS: Lung volumes are low. No confluent consolidative airspace disease. Some patchy areas of mild interstitial prominence and peribronchial cuffing are noted in the lungs bilaterally. No pneumothorax. No pleural effusions. No evidence of pulmonary edema. Heart size is normal. The patient is rotated to the left on today's exam, resulting in distortion of the mediastinal contours and reduced diagnostic sensitivity and specificity for mediastinal pathology. IMPRESSION: 1. Mild diffuse bronchial wall thickening and patchy peribronchial cuffing concerning for acute bronchitis. Electronically Signed   By: Trudie Reed M.D.   On: 06/19/2023 05:04   CT Head Wo Contrast  Result Date: 05/25/2023 CLINICAL DATA:  Mental status change, unknown cause. EXAM: CT HEAD WITHOUT CONTRAST TECHNIQUE: Contiguous axial images were obtained from the base of the skull through the vertex without intravenous contrast. RADIATION DOSE REDUCTION: This exam was performed according to the departmental  dose-optimization program which includes automated exposure control, adjustment of the mA and/or kV according to patient size and/or use of iterative reconstruction technique. COMPARISON:  Head CT 04/03/2023 FINDINGS: Brain: There is no evidence of an acute infarct, intracranial hemorrhage, mass, midline shift, or extra-axial fluid collection. Age advanced brain atrophy most notably affecting the cerebellum is unchanged. Vascular: No hyperdense vessel. Skull: No acute fracture or suspicious osseous lesion. Sinuses/Orbits: Visualized paranasal sinuses and mastoid air cells are clear. Unremarkable orbits. Other: None. IMPRESSION: No evidence of acute intracranial abnormality. Electronically Signed   By: Sebastian Ache M.D.   On: 05/25/2023 13:41     Discharge Exam: Vitals:   06/20/23 2117 06/21/23 0531  BP:  124/75 120/71  Pulse: 81 73  Resp: 18 20  Temp: 98.8 F (37.1 C) 98 F (36.7 C)  SpO2: 100% 100%   Vitals:   06/20/23 0522 06/20/23 1251 06/20/23 2117 06/21/23 0531  BP: 121/73 119/73 124/75 120/71  Pulse: 67 75 81 73  Resp: 18 16 18 20   Temp: 98.4 F (36.9 C) 98.1 F (36.7 C) 98.8 F (37.1 C) 98 F (36.7 C)  TempSrc: Oral Oral Oral Oral  SpO2: 99% 99% 100% 100%  Weight:      Height:        General: Pt is alert, awake, not in acute distress Cardiovascular: RRR, S1/S2 +, no rubs, no gallops Respiratory: CTA bilaterally, no wheezing, no rhonchi Abdominal: Soft, NT, ND, bowel sounds + Extremities: no edema, no cyanosis    The results of significant diagnostics from this hospitalization (including imaging, microbiology, ancillary and laboratory) are listed below for reference.     Microbiology: Recent Results (from the past 240 hour(s))  Blood Culture (routine x 2)     Status: None (Preliminary result)   Collection Time: 06/19/23  4:10 AM   Specimen: BLOOD LEFT FOREARM  Result Value Ref Range Status   Specimen Description   Final    BLOOD LEFT FOREARM Performed at [facility], [address] [place], [place] [address]    Special Requests   Final    BOTTLES DRAWN AEROBIC AND ANAEROBIC Blood Culture results may not be optimal due to an excessive volume of blood received in culture bottles Performed at [facility], [address] [place], [place] [address]    Culture   Final    NO GROWTH < 24 HOURS Performed at [facility], [address] [place], [place] [address]    Report Status PENDING  Incomplete  Resp panel by RT-PCR (RSV, Flu A&B, Covid) Anterior Nasal Swab     Status: None   Collection Time: 06/19/23  4:14 AM   Specimen: Anterior Nasal Swab  Result Value Ref Range Status   SARS Coronavirus 2 by RT PCR NEGATIVE NEGATIVE Final    Comment: (NOTE) SARS-CoV-2 target nucleic acids are NOT DETECTED.  The SARS-CoV-2 RNA is generally detectable in upper respiratory specimens during the acute phase of infection. The lowest concentration of SARS-CoV-2 viral copies this assay can detect is 138 copies/mL. A negative result does not preclude SARS-Cov-2 infection and should not be used as the sole basis for treatment or other patient management decisions. A negative result may occur with  improper specimen collection/handling, submission of specimen other than nasopharyngeal swab, presence of viral mutation(s) within the areas targeted by this assay, and inadequate number of viral copies(<138 copies/mL). A negative result must be combined with clinical observations, patient history, and epidemiological information. The expected result is Negative.  Fact Sheet for Patients:  BloggerCourse.com  Fact Sheet for Healthcare Providers:  SeriousBroker.it  This test is no t yet approved or cleared by the [place] FDA and  has been authorized for detection and/or diagnosis of SARS-CoV-2 by FDA under an Emergency Use Authorization (EUA). This EUA will remain  in effect (meaning  this test can be used) for the duration of the COVID-19 declaration under Section 564(b)(1) of the Act, 21 U.S.C.section 360bbb-3(b)(1), unless the authorization is terminated  or revoked sooner.       Influenza A by PCR NEGATIVE NEGATIVE Final   Influenza B by PCR NEGATIVE NEGATIVE Final    Comment: (NOTE)  The Xpert Xpress SARS-CoV-2/FLU/RSV plus assay is intended as an aid in the diagnosis of influenza from Nasopharyngeal swab specimens and should not be used as a sole basis for treatment. Nasal washings and aspirates are unacceptable for Xpert Xpress SARS-CoV-2/FLU/RSV testing.  Fact Sheet for Patients: BloggerCourse.com  Fact Sheet for Healthcare Providers: SeriousBroker.it  This test is not yet approved or cleared by the [place] FDA and has been authorized for detection and/or diagnosis of SARS-CoV-2 by FDA under an Emergency Use Authorization (EUA). This EUA will remain in effect (meaning this test can be used) for the duration of the COVID-19 declaration under Section 564(b)(1) of the Act, 21 U.S.C. section 360bbb-3(b)(1), unless the authorization is terminated or revoked.     Resp Syncytial Virus by PCR NEGATIVE NEGATIVE Final    Comment: (NOTE) Fact Sheet for Patients: BloggerCourse.com  Fact Sheet for Healthcare Providers: SeriousBroker.it  This test is not yet approved or cleared by the [place] FDA and has been authorized for detection and/or diagnosis of SARS-CoV-2 by FDA under an Emergency Use Authorization (EUA). This EUA will remain in effect (meaning this test can be used) for the duration of the COVID-19 declaration under Section 564(b)(1) of the Act, 21 U.S.C. section 360bbb-3(b)(1), unless the authorization is terminated or revoked.  Performed at [facility], [address] [place], [place] [address]   Blood Culture (routine x  2)     Status: None (Preliminary result)   Collection Time: 06/19/23  4:35 AM   Specimen: BLOOD  Result Value Ref Range Status   Specimen Description   Final    BLOOD RIGHT ANTECUBITAL Performed at [facility], [address] [place], [place] [address]    Special Requests   Final    BOTTLES DRAWN AEROBIC AND ANAEROBIC Blood Culture adequate volume Performed at [facility], [address] [place], [place] [address]    Culture   Final    NO GROWTH < 24 HOURS Performed at [facility], [address] [place], [place] [address]    Report Status PENDING  Incomplete  Urine Culture     Status: Abnormal   Collection Time: 06/19/23  6:08 AM   Specimen: Urine, Random  Result Value Ref Range Status   Specimen Description   Final    URINE, RANDOM Performed at [facility], [address] [place], [place] [address]    Special Requests   Final    NONE Reflexed from [id number] Performed at [facility], [address] [place], [place] [address]    Culture >=100,000 COLONIES/mL STREPTOCOCCUS GALLOLYTICUS (A)  Final   Report Status 06/21/2023 FINAL  Final   Organism ID, Bacteria STREPTOCOCCUS GALLOLYTICUS (A)  Final      Susceptibility   Streptococcus gallolyticus - MIC*    PENICILLIN 0.12 SENSITIVE Sensitive     CEFTRIAXONE 0.25 SENSITIVE Sensitive     ERYTHROMYCIN >=8 RESISTANT Resistant     LEVOFLOXACIN 2 SENSITIVE Sensitive     VANCOMYCIN 0.25 SENSITIVE Sensitive     * >=100,000 COLONIES/mL STREPTOCOCCUS GALLOLYTICUS  Respiratory (~20 pathogens) panel by PCR     Status: None   Collection Time: 06/20/23 10:42 AM   Specimen: Nasopharyngeal Swab; Respiratory  Result Value Ref Range Status   Adenovirus NOT DETECTED NOT DETECTED Final   Coronavirus 229E NOT DETECTED NOT DETECTED Final    Comment: (NOTE) The Coronavirus on the Respiratory Panel, DOES NOT  test for the novel  Coronavirus (2019 nCoV)    Coronavirus HKU1 NOT DETECTED NOT  DETECTED Final   Coronavirus NL63 NOT DETECTED NOT DETECTED Final   Coronavirus OC43 NOT DETECTED NOT DETECTED Final   Metapneumovirus NOT DETECTED NOT DETECTED Final   Rhinovirus / Enterovirus NOT DETECTED NOT DETECTED Final   Influenza A NOT DETECTED NOT DETECTED Final   Influenza B NOT DETECTED NOT DETECTED Final   Parainfluenza Virus 1 NOT DETECTED NOT DETECTED Final   Parainfluenza Virus 2 NOT DETECTED NOT DETECTED Final   Parainfluenza Virus 3 NOT DETECTED NOT DETECTED Final   Parainfluenza Virus 4 NOT DETECTED NOT DETECTED Final   Respiratory Syncytial Virus NOT DETECTED NOT DETECTED Final   Bordetella pertussis NOT DETECTED NOT DETECTED Final   Bordetella Parapertussis NOT DETECTED NOT DETECTED Final   Chlamydophila pneumoniae NOT DETECTED NOT DETECTED Final   Mycoplasma pneumoniae NOT DETECTED NOT DETECTED Final    Comment: Performed at [facility], [address], [place], [place] [address]     Labs: BNP (last 3 results) No results for input(s): "BNP" in the last 8760 hours. Basic Metabolic Panel: Recent Labs  Lab 06/19/23 0414 06/20/23 0455 06/21/23 0514  NA 136 142 140  K 3.4* 3.1* 3.6  CL 102 113* 111  CO2 21* 22 23  GLUCOSE 86 147* 102*  BUN 11 14 11   CREATININE 0.89 0.98 0.97  CALCIUM 8.3* 7.9* 7.9*   Liver Function Tests: Recent Labs  Lab 06/19/23 0414 06/20/23 0455  AST 66* 50*  ALT 25 19  ALKPHOS 73 72  BILITOT 4.1* 2.3*  PROT 5.9* 5.1*  ALBUMIN 2.6* 2.3*   No results for input(s): "LIPASE", "AMYLASE" in the last 168 hours. Recent Labs  Lab 06/19/23 0415  AMMONIA 54*   CBC: Recent Labs  Lab 06/19/23 0414 06/20/23 0455 06/20/23 1204 06/21/23 0514  WBC 4.6 3.8* 4.7 5.7  NEUTROABS 3.1  --  2.2 2.6  HGB 8.4* 7.1* 7.6* 7.3*  HCT 25.8* 22.2* 24.3* 23.1*  MCV 76.6* 79.6* 80.7 79.9*  PLT 52* 42* 49* 51*   Cardiac Enzymes: No results for input(s): "CKTOTAL", "CKMB", "CKMBINDEX", "TROPONINI" in the last 168 hours. BNP: Invalid  input(s): "POCBNP" CBG: No results for input(s): "GLUCAP" in the last 168 hours. D-Dimer No results for input(s): "DDIMER" in the last 72 hours. Hgb A1c No results for input(s): "HGBA1C" in the last 72 hours. Lipid Profile No results for input(s): "CHOL", "HDL", "LDLCALC", "TRIG", "CHOLHDL", "LDLDIRECT" in the last 72 hours. Thyroid function studies No results for input(s): "TSH", "T4TOTAL", "T3FREE", "THYROIDAB" in the last 72 hours.  Invalid input(s): "FREET3" Anemia work up No results for input(s): "VITAMINB12", "FOLATE", "FERRITIN", "TIBC", "IRON", "RETICCTPCT" in the last 72 hours. Urinalysis    Component Value Date/Time   COLORURINE YELLOW 06/19/2023 0608   APPEARANCEUR CLOUDY (A) 06/19/2023 0608   LABSPEC 1.020 06/19/2023 0608   PHURINE 7.0 06/19/2023 0608   GLUCOSEU NEGATIVE 06/19/2023 0608   HGBUR NEGATIVE 06/19/2023 0608   BILIRUBINUR NEGATIVE 06/19/2023 0608   BILIRUBINUR small (A) 04/17/2021 1646   KETONESUR NEGATIVE 06/19/2023 0608   PROTEINUR NEGATIVE 06/19/2023 0608   UROBILINOGEN 4.0 (A) 04/17/2021 1646   NITRITE NEGATIVE 06/19/2023 0608   LEUKOCYTESUR TRACE (A) 06/19/2023 0608   Sepsis Labs Recent Labs  Lab 06/19/23 0414 06/20/23 0455 06/20/23 1204 06/21/23 0514  WBC 4.6 3.8* 4.7 5.7   Microbiology Recent Results (from the past 240 hour(s))  Blood Culture (routine x 2)  Status: None (Preliminary result)   Collection Time: 06/19/23  4:10 AM   Specimen: BLOOD LEFT FOREARM  Result Value Ref Range Status   Specimen Description   Final    BLOOD LEFT FOREARM Performed at [facility], [address] [place], [place] [address]    Special Requests   Final    BOTTLES DRAWN AEROBIC AND ANAEROBIC Blood Culture results may not be optimal due to an excessive volume of blood received in culture bottles Performed at [facility], [address] [place], [place] [address]    Culture   Final    NO GROWTH < 24 HOURS Performed at [facility], [address], [place], [place] [address]    Report Status PENDING  Incomplete  Resp panel by RT-PCR (RSV, Flu A&B, Covid) Anterior Nasal Swab     Status: None   Collection Time: 06/19/23  4:14 AM   Specimen: Anterior Nasal Swab  Result Value Ref Range Status   SARS Coronavirus 2 by RT PCR NEGATIVE NEGATIVE Final    Comment: (NOTE) SARS-CoV-2 target nucleic acids are NOT DETECTED.  The SARS-CoV-2 RNA is generally detectable in upper respiratory specimens during the acute phase of infection. The lowest concentration of SARS-CoV-2 viral copies this assay can detect is 138 copies/mL. A negative result does not preclude SARS-Cov-2 infection and should not be used as the sole basis for treatment or other patient management decisions. A negative result may occur with  improper specimen collection/handling, submission of specimen other than nasopharyngeal swab, presence of viral mutation(s) within the areas targeted by this assay, and inadequate number of viral copies(<138 copies/mL). A negative result must be combined with clinical observations, patient history, and epidemiological information. The expected result is Negative.  Fact Sheet for Patients:  BloggerCourse.com  Fact Sheet for Healthcare Providers:  SeriousBroker.it  This test is no t yet approved or cleared by the [place] FDA and  has been authorized for detection and/or diagnosis of SARS-CoV-2 by FDA under an Emergency Use Authorization (EUA). This EUA will remain  in effect (meaning this test can be used) for the duration of the COVID-19 declaration under Section 564(b)(1) of the Act, 21 U.S.C.section 360bbb-3(b)(1), unless the authorization is terminated  or revoked sooner.       Influenza A by PCR NEGATIVE NEGATIVE Final   Influenza B by PCR NEGATIVE NEGATIVE Final    Comment: (NOTE) The Xpert Xpress SARS-CoV-2/FLU/RSV plus assay is intended as an  aid in the diagnosis of influenza from Nasopharyngeal swab specimens and should not be used as a sole basis for treatment. Nasal washings and aspirates are unacceptable for Xpert Xpress SARS-CoV-2/FLU/RSV testing.  Fact Sheet for Patients: BloggerCourse.com  Fact Sheet for Healthcare Providers: SeriousBroker.it  This test is not yet approved or cleared by the [place] FDA and has been authorized for detection and/or diagnosis of SARS-CoV-2 by FDA under an Emergency Use Authorization (EUA). This EUA will remain in effect (meaning this test can be used) for the duration of the COVID-19 declaration under Section 564(b)(1) of the Act, 21 U.S.C. section 360bbb-3(b)(1), unless the authorization is terminated or revoked.     Resp Syncytial Virus by PCR NEGATIVE NEGATIVE Final    Comment: (NOTE) Fact Sheet for Patients: BloggerCourse.com  Fact Sheet for Healthcare Providers: SeriousBroker.it  This test is not yet approved or cleared by the [place] FDA and has been authorized for detection and/or diagnosis of SARS-CoV-2 by FDA under an  Emergency Use Authorization (EUA). This EUA will remain in effect (meaning this test can be used) for the duration of the COVID-19 declaration under Section 564(b)(1) of the Act, 21 U.S.C. section 360bbb-3(b)(1), unless the authorization is terminated or revoked.  Performed at [facility], [address] [place], [place] [address]   Blood Culture (routine x 2)     Status: None (Preliminary result)   Collection Time: 06/19/23  4:35 AM   Specimen: BLOOD  Result Value Ref Range Status   Specimen Description   Final    BLOOD RIGHT ANTECUBITAL Performed at [facility], [address] [place], [place] [address]    Special Requests   Final    BOTTLES DRAWN AEROBIC AND ANAEROBIC Blood Culture adequate volume Performed  at [facility], [address] [place], [place] [address]    Culture   Final    NO GROWTH < 24 HOURS Performed at [facility], [address], [place], [place] [address]    Report Status PENDING  Incomplete  Urine Culture     Status: Abnormal   Collection Time: 06/19/23  6:08 AM   Specimen: Urine, Random  Result Value Ref Range Status   Specimen Description   Final    URINE, RANDOM Performed at [facility], [address] [place], [place] [address]    Special Requests   Final    NONE Reflexed from [id number] Performed at [facility], [address] [place], [place] [address]    Culture >=100,000 COLONIES/mL STREPTOCOCCUS GALLOLYTICUS (A)  Final   Report Status 06/21/2023 FINAL  Final   Organism ID, Bacteria STREPTOCOCCUS GALLOLYTICUS (A)  Final      Susceptibility   Streptococcus gallolyticus - MIC*    PENICILLIN 0.12 SENSITIVE Sensitive     CEFTRIAXONE 0.25 SENSITIVE Sensitive     ERYTHROMYCIN >=8 RESISTANT Resistant     LEVOFLOXACIN 2 SENSITIVE Sensitive     VANCOMYCIN 0.25 SENSITIVE Sensitive     * >=100,000 COLONIES/mL STREPTOCOCCUS GALLOLYTICUS  Respiratory (~20 pathogens) panel by PCR     Status: None   Collection Time: 06/20/23 10:42 AM   Specimen: Nasopharyngeal Swab; Respiratory  Result Value Ref Range Status   Adenovirus NOT DETECTED NOT DETECTED Final   Coronavirus 229E NOT DETECTED NOT DETECTED Final    Comment: (NOTE) The Coronavirus on the Respiratory Panel, DOES NOT test for the novel  Coronavirus (2019 nCoV)    Coronavirus HKU1 NOT DETECTED NOT DETECTED Final   Coronavirus NL63 NOT DETECTED NOT DETECTED Final   Coronavirus OC43 NOT DETECTED NOT DETECTED Final   Metapneumovirus NOT DETECTED NOT DETECTED Final   Rhinovirus / Enterovirus NOT DETECTED NOT DETECTED Final   Influenza A NOT DETECTED NOT DETECTED Final   Influenza B NOT DETECTED NOT DETECTED Final   Parainfluenza Virus 1 NOT DETECTED NOT DETECTED Final    Parainfluenza Virus 2 NOT DETECTED NOT DETECTED Final   Parainfluenza Virus 3 NOT DETECTED NOT DETECTED Final   Parainfluenza Virus 4 NOT DETECTED NOT DETECTED Final   Respiratory Syncytial Virus NOT DETECTED NOT DETECTED Final   Bordetella pertussis NOT DETECTED NOT DETECTED Final   Bordetella Parapertussis NOT DETECTED NOT DETECTED Final   Chlamydophila pneumoniae NOT DETECTED NOT DETECTED Final   Mycoplasma pneumoniae NOT DETECTED NOT DETECTED Final    Comment: Performed at [facility], [address] [place], [place] [address]    FURTHER  DISCHARGE INSTRUCTIONS:   Get Medicines reviewed and adjusted: Please take all your medications with you for your next visit with your Primary MD   Laboratory/radiological data: Please request your Primary MD to go over all hospital tests and procedure/radiological results at the follow up, please ask your Primary MD to get all Hospital records sent to his/her office.   In some cases, they will be blood work, cultures and biopsy results pending at the time of your discharge. Please request that your primary care M.D. goes through all the records of your hospital data and follows up on these results.   Also Note the following: If you experience worsening of your admission symptoms, develop shortness of breath, life threatening emergency, suicidal or homicidal thoughts you must seek medical attention immediately by calling 911 or calling your MD immediately  if symptoms less severe.   You must read complete instructions/literature along with all the possible adverse reactions/side effects for all the Medicines you take and that have been prescribed to you. Take any new Medicines after you have completely understood and accpet all the possible adverse reactions/side effects.    Do not drive when taking Pain medications or sleeping medications (Benzodaizepines)   Do not take more than prescribed Pain, Sleep and Anxiety Medications. It is not  advisable to combine anxiety,sleep and pain medications without talking with your primary care practitioner   Special Instructions: If you have smoked or chewed Tobacco  in the last 2 yrs please stop smoking, stop any regular Alcohol  and or any Recreational drug use.   Wear Seat belts while driving.   Please note: You were cared for by a hospitalist during your hospital stay. Once you are discharged, your primary care physician will handle any further medical issues. Please note that NO REFILLS for any discharge medications will be authorized once you are discharged, as it is imperative that you return to your primary care physician (or establish a relationship with a primary care physician if you do not have one) for your post hospital discharge needs so that they can reassess your need for medications and monitor your lab values  Time coordinating discharge: Over 30 minutes  SIGNED:   Hughie Closs, MD  [facility] 06/21/2023, 10:00 AM *Please note that this is a verbal dictation therefore any spelling or grammatical errors are due to the "Dragon Medical One" system interpretation. If 7PM-7AM, please contact night-coverage [email or web address]

## 2023-06-20 NOTE — Plan of Care (Signed)

## 2023-06-20 NOTE — Progress Notes (Signed)
PROGRESS NOTE    Juan [name]  HYQ:[id number] DOB: 1966-02-29 DOA: 06/19/2023 PCP: Everrett Coombe, DO   Brief Narrative:    Assessment & Plan:   Active Problems:   Hypokalemia   Thrombocytopenia (HCC)   Pancytopenia (HCC)   Alcoholic cirrhosis of liver with ascites (HCC)   Hyperbilirubinemia   Sepsis secondary to UTI [facility])   Fever  Fever/UTI: Patient had fever at home, none since he has been to the ED.  No urinary complaints and UA also not impressive with only some bacteria and leukocytes however now his urine culture is growing Streptococcus gallolyticus.  Which if real, can be dangerous and cause complications and for that reason, we will continue him on Rocephin, observe for another 24 hours and await final culture reports.  Blood cultures are negative so far.  Acute bronchitis: Chest x-ray shows acute bronchitis, patient has no respiratory symptoms.  Checking respiratory viral panel.  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  Continue home Lasix.  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  Hemoglobin showed drop to 7.1 this morning, repeat CBC shows improved 7.6.  Baseline is 8-9.  Chronic hypokalemia: Replenished.  GERD: Protonix.  Depression: BuSpar.  DVT prophylaxis: SCDs Start: 06/19/23 1150   Code Status: Full Code  Family Communication:  None present at bedside.  Plan of care discussed with patient in length and he/she verbalized understanding and agreed with it.  Status is: Observation The patient will require care spanning > 2 midnights and should be moved to inpatient because: Possible UTI, need final culture reports.   Estimated body mass index is 27.12 kg/m as calculated from the following:   Height as of this encounter: 5\' 5"  (1.651 m).   Weight as of this encounter: 73.9 kg.     Nutritional Assessment: Body mass index is 27.12 kg/m.Marland Kitchen Seen by dietician.  I agree with the assessment and plan as outlined below: Nutrition Status:        . Skin Assessment: I have examined the patient's skin and I agree with the wound assessment as performed by the wound care RN as outlined below:    Consultants:  None  Procedures:  None  Antimicrobials:  Anti-infectives (From admission, onward)    Start     Dose/Rate Route Frequency Ordered Stop   06/19/23 1400  piperacillin-tazobactam (ZOSYN) IVPB 3.375 g  Status:  Discontinued        3.375 g 12.5 mL/hr over 240 Minutes Intravenous Every 8 hours 06/19/23 1140 06/19/23 1146   06/19/23 1400  cefTRIAXone (ROCEPHIN) 2 g in sodium chloride 0.9 % 100 mL IVPB        2 g 200 mL/hr over 30 Minutes Intravenous Every 24 hours 06/19/23 1146     06/19/23 0545  piperacillin-tazobactam (ZOSYN) IVPB 3.375 g        3.375 g 100 mL/hr over 30 Minutes Intravenous  Once 06/19/23 0543 06/19/23 9528         Subjective: Patient seen and examined.  He denies any complaints at all.  Objective: Vitals:   06/19/23 1820 06/19/23 2207 06/20/23 0522 06/20/23 1251  BP: 118/74 120/72 121/73 119/73  Pulse: 84 72 67 75  Resp: 16 16 18 16   Temp: 99.1 F (37.3 C) 98 F (36.7 C) 98.4 F (36.9 C) 98.1 F (36.7 C)  TempSrc: Oral Oral Oral Oral  SpO2: 97% 97%  99% 99%  Weight:      Height:        Intake/Output Summary (Last 24 hours) at 06/20/2023 1331 Last data filed at 06/20/2023 0920 Gross per 24 hour  Intake 1328.12 ml  Output 1900 ml  Net -571.88 ml   Filed Weights   06/19/23 0352  Weight: 73.9 kg    Examination:  General exam: Appears calm and comfortable  Respiratory system: Clear to auscultation. Respiratory effort normal. Cardiovascular system: S1 & S2 heard, RRR. No JVD, murmurs, rubs, gallops or clicks. No pedal edema. Gastrointestinal system: Abdomen is nondistended, soft and nontender. No organomegaly or masses  felt. Normal bowel sounds heard. Central nervous system: Alert and oriented. No focal neurological deficits. Extremities: Symmetric 5 x 5 power. Skin: No rashes, lesions or ulcers Psychiatry: Judgement and insight appear normal. Mood & affect appropriate.    Data Reviewed: I have personally reviewed following labs and imaging studies  CBC: Recent Labs  Lab 06/19/23 0414 06/20/23 0455 06/20/23 1204  WBC 4.6 3.8* 4.7  NEUTROABS 3.1  --  2.2  HGB 8.4* 7.1* 7.6*  HCT 25.8* 22.2* 24.3*  MCV 76.6* 79.6* 80.7  PLT 52* 42* 49*   Basic Metabolic Panel: Recent Labs  Lab 06/19/23 0414 06/20/23 0455  NA 136 142  K 3.4* 3.1*  CL 102 113*  CO2 21* 22  GLUCOSE 86 147*  BUN 11 14  CREATININE 0.89 0.98  CALCIUM 8.3* 7.9*   GFR: Estimated Creatinine Clearance: 80.1 mL/min (by C-G formula based on SCr of 0.98 mg/dL). Liver Function Tests: Recent Labs  Lab 06/19/23 0414 06/20/23 0455  AST 66* 50*  ALT 25 19  ALKPHOS 73 72  BILITOT 4.1* 2.3*  PROT 5.9* 5.1*  ALBUMIN 2.6* 2.3*   No results for input(s): "LIPASE", "AMYLASE" in the last 168 hours. Recent Labs  Lab 06/19/23 0415  AMMONIA 54*   Coagulation Profile: Recent Labs  Lab 06/19/23 0414  INR 1.7*   Cardiac Enzymes: No results for input(s): "CKTOTAL", "CKMB", "CKMBINDEX", "TROPONINI" in the last 168 hours. BNP (last 3 results) No results for input(s): "PROBNP" in the last 8760 hours. HbA1C: No results for input(s): "HGBA1C" in the last 72 hours. CBG: No results for input(s): "GLUCAP" in the last 168 hours. Lipid Profile: No results for input(s): "CHOL", "HDL", "LDLCALC", "TRIG", "CHOLHDL", "LDLDIRECT" in the last 72 hours. Thyroid Function Tests: No results for input(s): "TSH", "T4TOTAL", "FREET4", "T3FREE", "THYROIDAB" in the last 72 hours. Anemia Panel: No results for input(s): "VITAMINB12", "FOLATE", "FERRITIN", "TIBC", "IRON", "RETICCTPCT" in the last 72 hours. Sepsis Labs: Recent Labs  Lab  06/19/23 0414 06/19/23 0608  LATICACIDVEN 4.3* 3.3*    Recent Results (from the past 240 hour(s))  Blood Culture (routine x 2)     Status: None (Preliminary result)   Collection Time: 06/19/23  4:10 AM   Specimen: BLOOD LEFT FOREARM  Result Value Ref Range Status   Specimen Description   Final    BLOOD LEFT FOREARM Performed at [facility], [address], [place], [place] [address]    Special Requests   Final    BOTTLES DRAWN AEROBIC AND ANAEROBIC Blood Culture results may not be optimal due to an excessive volume of blood received in culture bottles Performed at [facility], [address] [place], [place] [address]    Culture   Final    NO GROWTH < 24 HOURS Performed at [facility], [address], [place], [place]  [address]    Report Status PENDING  Incomplete  Resp panel by RT-PCR (RSV, Flu A&B, Covid) Anterior Nasal Swab     Status: None   Collection Time: 06/19/23  4:14 AM   Specimen: Anterior Nasal Swab  Result Value Ref Range Status   SARS Coronavirus 2 by RT PCR NEGATIVE NEGATIVE Final    Comment: (NOTE) SARS-CoV-2 target nucleic acids are NOT DETECTED.  The SARS-CoV-2 RNA is generally detectable in upper respiratory specimens during the acute phase of infection. The lowest concentration of SARS-CoV-2 viral copies this assay can detect is 138 copies/mL. A negative result does not preclude SARS-Cov-2 infection and should not be used as the sole basis for treatment or other patient management decisions. A negative result may occur with  improper specimen collection/handling, submission of specimen other than nasopharyngeal swab, presence of viral mutation(s) within the areas targeted by this assay, and inadequate number of viral copies(<138 copies/mL). A negative result must be combined with clinical observations, patient history, and epidemiological information. The expected result is Negative.  Fact Sheet for Patients:   BloggerCourse.com  Fact Sheet for Healthcare Providers:  SeriousBroker.it  This test is no t yet approved or cleared by the [place] FDA and  has been authorized for detection and/or diagnosis of SARS-CoV-2 by FDA under an Emergency Use Authorization (EUA). This EUA will remain  in effect (meaning this test can be used) for the duration of the COVID-19 declaration under Section 564(b)(1) of the Act, 21 U.S.C.section 360bbb-3(b)(1), unless the authorization is terminated  or revoked sooner.       Influenza A by PCR NEGATIVE NEGATIVE Final   Influenza B by PCR NEGATIVE NEGATIVE Final    Comment: (NOTE) The Xpert Xpress SARS-CoV-2/FLU/RSV plus assay is intended as an aid in the diagnosis of influenza from Nasopharyngeal swab specimens and should not be used as a sole basis for treatment. Nasal washings and aspirates are unacceptable for Xpert Xpress SARS-CoV-2/FLU/RSV testing.  Fact Sheet for Patients: BloggerCourse.com  Fact Sheet for Healthcare Providers: SeriousBroker.it  This test is not yet approved or cleared by the [place] FDA and has been authorized for detection and/or diagnosis of SARS-CoV-2 by FDA under an Emergency Use Authorization (EUA). This EUA will remain in effect (meaning this test can be used) for the duration of the COVID-19 declaration under Section 564(b)(1) of the Act, 21 U.S.C. section 360bbb-3(b)(1), unless the authorization is terminated or revoked.     Resp Syncytial Virus by PCR NEGATIVE NEGATIVE Final    Comment: (NOTE) Fact Sheet for Patients: BloggerCourse.com  Fact Sheet for Healthcare Providers: SeriousBroker.it  This test is not yet approved or cleared by the [place] FDA and has been authorized for detection and/or diagnosis of SARS-CoV-2 by FDA under an Emergency Use  Authorization (EUA). This EUA will remain in effect (meaning this test can be used) for the duration of the COVID-19 declaration under Section 564(b)(1) of the Act, 21 U.S.C. section 360bbb-3(b)(1), unless the authorization is terminated or revoked.  Performed at [facility], [address] [place], [place] [address]   Blood Culture (routine x 2)     Status: None (Preliminary result)   Collection Time: 06/19/23  4:35 AM   Specimen: BLOOD  Result Value Ref Range Status   Specimen Description   Final    BLOOD RIGHT ANTECUBITAL Performed at [facility], [address] [place], [place] [address]    Special Requests   Final  BOTTLES DRAWN AEROBIC AND ANAEROBIC Blood Culture adequate volume Performed at [facility], [address] [place], [place] [address]    Culture   Final    NO GROWTH < 24 HOURS Performed at [facility], [address], [place], [place] [address]    Report Status PENDING  Incomplete  Urine Culture     Status: Abnormal (Preliminary result)   Collection Time: 06/19/23  6:08 AM   Specimen: Urine, Random  Result Value Ref Range Status   Specimen Description   Final    URINE, RANDOM Performed at [facility], [address] [place], [place] [address]    Special Requests   Final    NONE Reflexed from [id number] Performed at [facility], [address] [place], [place] [address]    Culture (A)  Final    >=100,000 COLONIES/mL STREPTOCOCCUS GALLOLYTICUS SUSCEPTIBILITIES TO FOLLOW Performed at [facility], [address], [place], [place] [address]    Report Status PENDING  Incomplete     Radiology Studies: DG Chest Port 1 View  Result Date: 06/19/2023 CLINICAL DATA:  57 year old male with possible sepsis. Fever to 103 degrees. EXAM: PORTABLE CHEST 1 VIEW COMPARISON:  Chest x-ray 04/03/2023. FINDINGS: Lung volumes are low. No confluent consolidative airspace disease. Some patchy areas of  mild interstitial prominence and peribronchial cuffing are noted in the lungs bilaterally. No pneumothorax. No pleural effusions. No evidence of pulmonary edema. Heart size is normal. The patient is rotated to the left on today's exam, resulting in distortion of the mediastinal contours and reduced diagnostic sensitivity and specificity for mediastinal pathology. IMPRESSION: 1. Mild diffuse bronchial wall thickening and patchy peribronchial cuffing concerning for acute bronchitis. Electronically Signed   By: Trudie Reed M.D.   On: 06/19/2023 05:04    Scheduled Meds:  busPIRone  15 mg Oral BID   pantoprazole  40 mg Oral Daily   potassium chloride SA  20 mEq Oral BID   QUEtiapine  25 mg Oral QHS   Continuous Infusions:  cefTRIAXone (ROCEPHIN)  IV 2 g (06/20/23 1224)     LOS: 0 days   Hughie Closs, MD [facility]  06/20/2023, 1:31 PM   *Please note that this is a verbal dictation therefore any spelling or grammatical errors are due to the "Dragon Medical One" system interpretation.  Please page via Amion and do not message via secure chat for urgent patient care matters. Secure chat can be used for non urgent patient care matters.  How to contact the [facility] Attending or Consulting provider 7A - 7P or covering provider during after hours 7P -7A, for this patient?  Check the care team in [facility] and look for a) attending/consulting TRH provider listed and b) the [facility] team listed. Page or secure chat 7A-7P. Log into [email or web address] and use [facility] universal password to access. If you do not have the password, please contact the hospital operator. Locate the [facility] provider you are looking for under [facility] and page to a number that you can be directly reached. If you still have difficulty reaching the provider, please page the [facility] (Director on Call) for the Hospitalists listed on amion for assistance.

## 2023-06-20 NOTE — TOC CM/SW Note (Signed)
Transition of Care [facility]) - Inpatient Brief Assessment   Patient Details  Name: Juan [name] MRN: [id number] Date of Birth: [birth year]  Transition of [facility]) CM/SW Contact:    Howell Rucks, RN Phone Number: 06/20/2023, 12:05 PM   Clinical Narrative: Met with pt at bedside to introduce role of TOC/NCM and review for dc planning. 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  Pt reports he resides with his spouse and feels safe returning home, spouse will provide transportation at discharge. TOC Brief Assessment completed. No TOC needs identified at this time.     Transition of Care Asessment: Insurance and Status: Insurance coverage has been reviewed Patient has primary care physician: Yes Home environment has been reviewed: resides in private residence with spouse Prior level of function:: Independent with rollator as needed Prior/Current Home Services: No current home services Social Determinants of Health Reivew: SDOH reviewed no interventions necessary Readmission risk has been reviewed: Yes Transition of care needs: no transition of care needs at this time

## 2023-06-21 ENCOUNTER — Other Ambulatory Visit (HOSPITAL_COMMUNITY): Payer: Self-pay

## 2023-06-21 DIAGNOSIS — E876 Hypokalemia: Secondary | ICD-10-CM | POA: Diagnosis not present

## 2023-06-21 DIAGNOSIS — D696 Thrombocytopenia, unspecified: Secondary | ICD-10-CM | POA: Diagnosis not present

## 2023-06-21 DIAGNOSIS — K7031 Alcoholic cirrhosis of liver with ascites: Secondary | ICD-10-CM | POA: Diagnosis not present

## 2023-06-21 LAB — CBC WITH DIFFERENTIAL/PLATELET
Abs Immature Granulocytes: 0.02 10*3/uL (ref 0.00–0.07)
Basophils Absolute: 0 10*3/uL (ref 0.0–0.1)
Basophils Relative: 1 %
Eosinophils Absolute: 0.5 10*3/uL (ref 0.0–0.5)
Eosinophils Relative: 10 %
HCT: 23.1 % — ABNORMAL LOW (ref 39.0–52.0)
Hemoglobin: 7.3 g/dL — ABNORMAL LOW (ref 13.0–17.0)
Immature Granulocytes: 0 %
Lymphocytes Relative: 31 %
Lymphs Abs: 1.8 10*3/uL (ref 0.7–4.0)
MCH: 25.3 pg — ABNORMAL LOW (ref 26.0–34.0)
MCHC: 31.6 g/dL (ref 30.0–36.0)
MCV: 79.9 fL — ABNORMAL LOW (ref 80.0–100.0)
Monocytes Absolute: 0.8 10*3/uL (ref 0.1–1.0)
Monocytes Relative: 14 %
Neutro Abs: 2.6 10*3/uL (ref 1.7–7.7)
Neutrophils Relative %: 44 %
Platelets: 51 10*3/uL — ABNORMAL LOW (ref 150–400)
RBC: 2.89 MIL/uL — ABNORMAL LOW (ref 4.22–5.81)
RDW: 26 % — ABNORMAL HIGH (ref 11.5–15.5)
WBC: 5.7 10*3/uL (ref 4.0–10.5)
nRBC: 0 % (ref 0.0–0.2)

## 2023-06-21 LAB — BASIC METABOLIC PANEL
Anion gap: 6 (ref 5–15)
BUN: 11 mg/dL (ref 6–20)
CO2: 23 mmol/L (ref 22–32)
Calcium: 7.9 mg/dL — ABNORMAL LOW (ref 8.9–10.3)
Chloride: 111 mmol/L (ref 98–111)
Creatinine, Ser: 0.97 mg/dL (ref 0.61–1.24)
GFR, Estimated: >60 mL/min (ref >60)
Glucose, Bld: 102 mg/dL — ABNORMAL HIGH (ref 70–99)
Potassium: 3.6 mmol/L (ref 3.5–5.1)
Sodium: 140 mmol/L (ref 135–145)

## 2023-06-21 LAB — URINE CULTURE: Culture: >=100000 — AB

## 2023-06-21 MED ORDER — CEPHALEXIN 500 MG PO CAPS
500.0000 mg | ORAL_CAPSULE | Freq: Three times a day (TID) | ORAL | 0 refills | Status: AC
  Filled 2023-06-21: qty 15, 5d supply, fill #0

## 2023-06-22 DIAGNOSIS — F419 Anxiety disorder, unspecified: Secondary | ICD-10-CM | POA: Diagnosis not present

## 2023-06-23 ENCOUNTER — Inpatient Hospital Stay: Payer: Commercial Managed Care - PPO | Admitting: Family Medicine

## 2023-06-24 LAB — CULTURE, BLOOD (ROUTINE X 2)
Culture: NO GROWTH
Culture: NO GROWTH
Special Requests: ADEQUATE

## 2023-06-27 ENCOUNTER — Ambulatory Visit (INDEPENDENT_AMBULATORY_CARE_PROVIDER_SITE_OTHER): Payer: Commercial Managed Care - PPO | Admitting: Family Medicine

## 2023-06-27 ENCOUNTER — Encounter: Payer: Self-pay | Admitting: Family Medicine

## 2023-06-27 VITALS — BP 119/66 | HR 93 | Temp 97.6°F | Ht 65.0 in | Wt 185.0 lb

## 2023-06-27 DIAGNOSIS — R509 Fever, unspecified: Secondary | ICD-10-CM | POA: Insufficient documentation

## 2023-06-27 DIAGNOSIS — R601 Generalized edema: Secondary | ICD-10-CM

## 2023-06-27 HISTORY — DX: Fever, unspecified: R50.9

## 2023-06-27 NOTE — Assessment & Plan Note (Signed)
May need to add on aldactone or continues to have edema.  Encouraged increased protein intake.

## 2023-06-27 NOTE — Progress Notes (Signed)
Juan [name] - 57 y.o. male MRN [id number]  Date of birth: 1966/06/01  Subjective Chief Complaint  Patient presents with   Hospitalization Follow-up    HPI Juan [name] is a 57 y.o. male here today for hospital follow up.  Recent admission for febrile illness.  Mild lactic acidosis noted at time of admission but felt to be due to his his chronic liver disease.  Urine with Strep gallolyticus.  CXR with signs of acute bronchitis.  Respiratory panel was negative.   Treated w/ Rocephin initially and d/c with Keflex.  Today he reports that he has felt febrile if he misses dose of medication.  He reports that sometimes he will sleep through dose.  He is afebrile in clinic today.  Denies respiratory or urinary symptoms at this time. He has about 3 days of cephalexin left.    ROS:  A comprehensive ROS was completed and negative except as noted per HPI   Allergies  Allergen Reactions   Apple Juice Swelling and Other (See Comments)    Tongue swelling   Cucumber Extract Itching and Nausea And Vomiting    No extracts; just cucumber   Depakote Er [Divalproex Sodium Er] Swelling and Other (See Comments)    Tongue swelling   Depakote [Valproic Acid] Anaphylaxis   Peanut Butter Flavor Anaphylaxis and Swelling   Peanut Oil Swelling   Peanut-Containing Drug Products Swelling   Shellfish Allergy Itching and Swelling   Shrimp Extract Itching and Swelling   Strawberry Extract Nausea And Vomiting and Swelling   Apple Swelling   Cantaloupe Extract Allergy Skin Test Rash   Codeine Itching, Rash and Other (See Comments)    Patient reports he can take "CODONES" without problems   Firvanq [Vancomycin] Rash   Lactose Intolerance (Gi) Diarrhea and Other (See Comments)    Indigestion, Stomach pain, Flatulence    Past Medical History:  Diagnosis Date   Alcohol abuse 01/11/2022   Alcohol addiction (HCC)    Anxiety    Bleeding internal hemorrhoids 08/18/2022   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   Chronic fatigue    Cirrhosis (HCC)    Colon polyps    Depression    GERD (gastroesophageal reflux disease)    Hemochromatosis    Hiatal hernia 02/20/2022   Hx of blood clots    Leg   Hyperreflexia    Hypertension    PTSD (post-traumatic stress disorder)    Traumatic hemorrhagic shock [facility])     Past Surgical History:  Procedure Laterality Date   BIOPSY  09/24/2021   Procedure: BIOPSY;  Surgeon: Imogene Burn, MD;  Location: [facility] ENDOSCOPY;  Service: Gastroenterology;;   Fidela Salisbury RELEASE Bilateral    COLONOSCOPY WITH PROPOFOL N/A 09/24/2021   Procedure: COLONOSCOPY WITH PROPOFOL;  Surgeon: Imogene Burn, MD;  Location: [facility] ENDOSCOPY;  Service: Gastroenterology;  Laterality: N/A;   ESOPHAGOGASTRODUODENOSCOPY (EGD) WITH PROPOFOL N/A 09/24/2021   Procedure: ESOPHAGOGASTRODUODENOSCOPY (EGD) WITH PROPOFOL;  Surgeon: Imogene Burn, MD;  Location: [facility] ENDOSCOPY;  Service: Gastroenterology;  Laterality: N/A;   ESOPHAGOGASTRODUODENOSCOPY (EGD) WITH PROPOFOL N/A 08/18/2022   Procedure: ESOPHAGOGASTRODUODENOSCOPY (EGD) WITH PROPOFOL;  Surgeon: Hilarie Fredrickson, MD;  Location: [facility] ENDOSCOPY;  Service: Gastroenterology;  Laterality: N/A;   FLEXIBLE SIGMOIDOSCOPY N/A 08/18/2022   Procedure: FLEXIBLE SIGMOIDOSCOPY;  Surgeon: Hilarie Fredrickson, MD;  Location: [facility] ENDOSCOPY;  Service: Gastroenterology;  Laterality: N/A;   FRACTURE SURGERY     left ankle plate   HERNIA REPAIR     inguinal  KNEE SURGERY Right    x 4   SHOULDER SURGERY Bilateral    x 2   VASECTOMY      Social History   Socioeconomic History   Marital status: Married    Spouse name: [name]   Number of children: 2   Years of education: Not on file   Highest education level: 12th grade  Occupational History    Comment: disability  Tobacco Use   Smoking status: Never   Smokeless tobacco: Current    Types: Snuff  Vaping Use   Vaping status: Never Used  Substance and Sexual Activity   Alcohol use: Not Currently    Drug use: Never   Sexual activity: Yes    Partners: Female  Other Topics Concern   Not on file  Social History Narrative   Lives with wife   Social Determinants of Health   Financial Resource Strain: Medium Risk (01/09/2023)   Overall Financial Resource Strain (CARDIA)    Difficulty of Paying Living Expenses: Somewhat hard  Food Insecurity: No Food Insecurity (06/19/2023)   Hunger Vital Sign    Worried About Running Out of Food in the Last Year: Never true    Ran Out of Food in the Last Year: Never true  Transportation Needs: No Transportation Needs (06/19/2023)   PRAPARE - Administrator, Civil Service (Medical): No    Lack of Transportation (Non-Medical): No  Physical Activity: Insufficiently Active (01/09/2023)   Exercise Vital Sign    Days of Exercise per Week: 1 day    Minutes of Exercise per Session: 10 min  Stress: Stress Concern Present (01/09/2023)   [employer] of Occupational Health - Occupational Stress Questionnaire    Feeling of Stress : To some extent  Social Connections: Moderately Integrated (01/09/2023)   Social Connection and Isolation Panel [NHANES]    Frequency of Communication with Friends and Family: More than three times a week    Frequency of Social Gatherings with Friends and Family: Once a week    Attends Religious Services: More than 4 times per year    Active Member of Clubs or Organizations: No    Attends Engineer, structural: Not on file    Marital Status: Married    Family History  Problem Relation Age of Onset   Pulmonary fibrosis Mother    Hypertension Father    Other Father        liver failure   Diabetes Brother    Breast cancer Paternal Aunt    Colon cancer Neg Hx    Esophageal cancer Neg Hx    Stomach cancer Neg Hx    Rectal cancer Neg Hx     Health Maintenance  Topic Date Due   COVID-19 Vaccine (4 - 2023-24 season) 04/24/2023   INFLUENZA VACCINE  11/21/2023 (Originally 03/24/2023)   DTaP/Tdap/Td (3 - Td  or Tdap) 06/20/2029   Colonoscopy  09/25/2031   Hepatitis C Screening  Completed   HIV Screening  Completed   Zoster Vaccines- Shingrix  Completed   HPV VACCINES  Aged Out     ----------------------------------------------------------------------------------------------------------------------------------------------------------------------------------------------------------------- Physical Exam BP 119/66 (BP Location: Left Arm, Patient Position: Sitting, Cuff Size: Normal)   Pulse 93   Temp 97.6 F (36.4 C) (Oral)   Ht 5\' 5"  (1.651 m)   Wt 185 lb (83.9 kg)   SpO2 99%   BMI 30.79 kg/m   Physical Exam Constitutional:      Appearance: Normal appearance.  Cardiovascular:  Rate and Rhythm: Normal rate and regular rhythm.  Pulmonary:     Effort: Pulmonary effort is normal.     Breath sounds: Normal breath sounds.  Musculoskeletal:     Right lower leg: Edema present.     Left lower leg: Edema present.  Neurological:     Mental Status: He is alert.  Psychiatric:        Mood and Affect: Mood normal.        Behavior: Behavior normal.     ------------------------------------------------------------------------------------------------------------------------------------------------------------------------------------------------------------------- Assessment and Plan  Febrile illness Overall this has improved.  He did test positive for UTI and a has a few days of cephalexin left.  He will complete this.  Recommended f/u labs however he would prefer to defer this today.  We'll plan to see him back in a couple of weeks and check labs at that time.    Anasarca May need to add on aldactone or continues to have edema.  Encouraged increased protein intake.     No orders of the defined types were placed in this encounter.   No follow-ups on file.    This visit occurred during the SARS-CoV-2 public health emergency.  Safety protocols were in place, including screening  questions prior to the visit, additional usage of staff PPE, and extensive cleaning of exam room while observing appropriate contact time as indicated for disinfecting solutions.

## 2023-06-27 NOTE — Assessment & Plan Note (Signed)
Overall this has improved.  He did test positive for UTI and a has a few days of cephalexin left.  He will complete this.  Recommended f/u labs however he would prefer to defer this today.  We'll plan to see him back in a couple of weeks and check labs at that time.

## 2023-07-07 DIAGNOSIS — F331 Major depressive disorder, recurrent, moderate: Secondary | ICD-10-CM | POA: Diagnosis not present

## 2023-07-07 DIAGNOSIS — F411 Generalized anxiety disorder: Secondary | ICD-10-CM | POA: Diagnosis not present

## 2023-07-07 DIAGNOSIS — F1020 Alcohol dependence, uncomplicated: Secondary | ICD-10-CM | POA: Diagnosis not present

## 2023-07-07 DIAGNOSIS — G4700 Insomnia, unspecified: Secondary | ICD-10-CM | POA: Diagnosis not present

## 2023-07-11 ENCOUNTER — Ambulatory Visit (INDEPENDENT_AMBULATORY_CARE_PROVIDER_SITE_OTHER): Payer: Commercial Managed Care - PPO | Admitting: Family Medicine

## 2023-07-11 ENCOUNTER — Encounter: Payer: Self-pay | Admitting: Family Medicine

## 2023-07-11 VITALS — BP 124/71 | HR 84 | Temp 98.1°F | Ht 65.0 in | Wt 178.0 lb

## 2023-07-11 DIAGNOSIS — R509 Fever, unspecified: Secondary | ICD-10-CM | POA: Diagnosis not present

## 2023-07-11 DIAGNOSIS — F332 Major depressive disorder, recurrent severe without psychotic features: Secondary | ICD-10-CM

## 2023-07-11 DIAGNOSIS — R601 Generalized edema: Secondary | ICD-10-CM | POA: Diagnosis not present

## 2023-07-11 DIAGNOSIS — F331 Major depressive disorder, recurrent, moderate: Secondary | ICD-10-CM | POA: Insufficient documentation

## 2023-07-11 NOTE — Assessment & Plan Note (Signed)
Improved at this time.  He will let me know if worsening again.

## 2023-07-11 NOTE — Assessment & Plan Note (Signed)
He is currently being followed by behavioral health at the [place].  He denies any worsening of symptoms.  No further suicidal ideations at this time.

## 2023-07-11 NOTE — Assessment & Plan Note (Signed)
No further fevers at this time.  Discussed with him that if he were to develop new or worsening symptoms to contact the clinic.  Red flags and precautions discussed.

## 2023-07-11 NOTE — Progress Notes (Signed)
Juan [name] - 57 y.o. male MRN [id number]  Date of birth: [birth year]  Subjective Chief Complaint  Patient presents with   Follow-up    HPI Billie Luebbers is a 57 year old male here today for follow-up visit.  Reports he is doing pretty well.  He has had elevated temperature to 99 since his last visit.  No true fevers.  Denies any urinary symptoms at this time.  His swelling has improved.  Denies increased fatigue, abdominal pain.  He is working on increasing caloric intake as well as protein intake.  ROS:  A comprehensive ROS was completed and negative except as noted per HPI  Allergies  Allergen Reactions   Apple Juice Swelling and Other (See Comments)    Tongue swelling   Cucumber Extract Itching and Nausea And Vomiting    No extracts; just cucumber   Depakote Er [Divalproex Sodium Er] Swelling and Other (See Comments)    Tongue swelling   Depakote [Valproic Acid] Anaphylaxis   Peanut Butter Flavor Anaphylaxis and Swelling   Peanut Oil Swelling   Peanut-Containing Drug Products Swelling   Shellfish Allergy Itching and Swelling   Shrimp Extract Itching and Swelling   Strawberry Extract Nausea And Vomiting and Swelling   Apple Swelling   Cantaloupe Extract Allergy Skin Test Rash   Codeine Itching, Rash and Other (See Comments)    Patient reports he can take "CODONES" without problems   Firvanq [Vancomycin] Rash   Lactose Intolerance (Gi) Diarrhea and Other (See Comments)    Indigestion, Stomach pain, Flatulence    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   Chronic fatigue    Cirrhosis (HCC)    Colon polyps    Depression    GERD (gastroesophageal reflux disease)    Hemochromatosis    Hiatal hernia 02/20/2022   Hx of blood clots    Leg   Hyperreflexia    Hypertension    PTSD (post-traumatic stress disorder)     Traumatic hemorrhagic shock [facility])     Past Surgical History:  Procedure Laterality Date   BIOPSY  09/24/2021   Procedure: BIOPSY;  Surgeon: Imogene Burn, MD;  Location: [facility] ENDOSCOPY;  Service: Gastroenterology;;   Fidela Salisbury RELEASE Bilateral    COLONOSCOPY WITH PROPOFOL N/A 09/24/2021   Procedure: COLONOSCOPY WITH PROPOFOL;  Surgeon: Imogene Burn, MD;  Location: [facility] ENDOSCOPY;  Service: Gastroenterology;  Laterality: N/A;   ESOPHAGOGASTRODUODENOSCOPY (EGD) WITH PROPOFOL N/A 09/24/2021   Procedure: ESOPHAGOGASTRODUODENOSCOPY (EGD) WITH PROPOFOL;  Surgeon: Imogene Burn, MD;  [facility];  Service: Gastroenterology;  Laterality: N/A;   ESOPHAGOGASTRODUODENOSCOPY (EGD) WITH PROPOFOL N/A 08/18/2022   Procedure: ESOPHAGOGASTRODUODENOSCOPY (EGD) WITH PROPOFOL;  Surgeon: Hilarie Fredrickson, MD;  Location: [facility] ENDOSCOPY;  Service: Gastroenterology;  Laterality: N/A;   FLEXIBLE SIGMOIDOSCOPY N/A 08/18/2022   Procedure: FLEXIBLE SIGMOIDOSCOPY;  Surgeon: Hilarie Fredrickson, MD;  Location: [facility] ENDOSCOPY;  Service: Gastroenterology;  Laterality: N/A;   FRACTURE SURGERY     left ankle plate   HERNIA REPAIR     inguinal   KNEE SURGERY Right    x 4   SHOULDER SURGERY Bilateral    x 2   VASECTOMY      Social History   Socioeconomic History   Marital status: Married    Spouse name: [name]   Number of children: 2   Years of  education: Not on file   Highest education level: 12th grade  Occupational History    Comment: disability  Tobacco Use   Smoking status: Never   Smokeless tobacco: Current    Types: Snuff  Vaping Use   Vaping status: Never Used  Substance and Sexual Activity   Alcohol use: Not Currently   Drug use: Never   Sexual activity: Yes    Partners: Female  Other Topics Concern   Not on file  Social History Narrative   Lives with wife   Social Determinants of Health   Financial Resource Strain: Medium Risk (01/09/2023)   Overall Financial Resource Strain (CARDIA)     Difficulty of Paying Living Expenses: Somewhat hard  Food Insecurity: No Food Insecurity (06/19/2023)   Hunger Vital Sign    Worried About Running Out of Food in the Last Year: Never true    Ran Out of Food in the Last Year: Never true  Transportation Needs: No Transportation Needs (06/19/2023)   PRAPARE - Administrator, Civil Service (Medical): No    Lack of Transportation (Non-Medical): No  Physical Activity: Insufficiently Active (01/09/2023)   Exercise Vital Sign    Days of Exercise per Week: 1 day    Minutes of Exercise per Session: 10 min  Stress: Stress Concern Present (01/09/2023)   [employer] of Occupational Health - Occupational Stress Questionnaire    Feeling of Stress : To some extent  Social Connections: Moderately Integrated (01/09/2023)   Social Connection and Isolation Panel [NHANES]    Frequency of Communication with Friends and Family: More than three times a week    Frequency of Social Gatherings with Friends and Family: Once a week    Attends Religious Services: More than 4 times per year    Active Member of Clubs or Organizations: No    Attends Engineer, structural: Not on file    Marital Status: Married    Family History  Problem Relation Age of Onset   Pulmonary fibrosis Mother    Hypertension Father    Other Father        liver failure   Diabetes Brother    Breast cancer Paternal Aunt    Colon cancer Neg Hx    Esophageal cancer Neg Hx    Stomach cancer Neg Hx    Rectal cancer Neg Hx     Health Maintenance  Topic Date Due   COVID-19 Vaccine (4 - 2023-24 season) 04/24/2023   INFLUENZA VACCINE  11/21/2023 (Originally 03/24/2023)   DTaP/Tdap/Td (3 - Td or Tdap) 06/20/2029   Colonoscopy  09/25/2031   Hepatitis C Screening  Completed   HIV Screening  Completed   Zoster Vaccines- Shingrix  Completed   HPV VACCINES  Aged Out      ----------------------------------------------------------------------------------------------------------------------------------------------------------------------------------------------------------------- Physical Exam BP 124/71 (BP Location: Left Arm, Patient Position: Sitting, Cuff Size: Normal)   Pulse 84   Temp 98.1 F (36.7 C) (Oral)   Ht 5\' 5"  (1.651 m)   Wt 178 lb (80.7 kg)   SpO2 98%   BMI 29.62 kg/m   Physical Exam Constitutional:      Appearance: Normal appearance.  Cardiovascular:     Rate and Rhythm: Normal rate and regular rhythm.  Pulmonary:     Effort: Pulmonary effort is normal.     Breath sounds: Normal breath sounds.  Neurological:     General: No focal deficit present.     Mental Status: He is alert.  Psychiatric:  Mood and Affect: Mood normal.        Behavior: Behavior normal.     ------------------------------------------------------------------------------------------------------------------------------------------------------------------------------------------------------------------- Assessment and Plan  MDD (major depressive disorder), recurrent severe, without psychosis (HCC) He is currently being followed by behavioral health at the [place].  He denies any worsening of symptoms.  No further suicidal ideations at this time.  Anasarca Improved at this time.  He will let me know if worsening again.  Febrile illness No further fevers at this time.  Discussed with him that if he were to develop new or worsening symptoms to contact the clinic.  Red flags and precautions discussed.   No orders of the defined types were placed in this encounter.   Return in about 3 months (around 10/11/2023) for Mood/BH, Liver disease.    This visit occurred during the SARS-CoV-2 public health emergency.  Safety protocols were in place, including screening questions prior to the visit, additional usage of staff PPE, and extensive cleaning of exam room  while observing appropriate contact time as indicated for disinfecting solutions.

## 2023-07-18 ENCOUNTER — Ambulatory Visit: Payer: Commercial Managed Care - PPO | Admitting: Family Medicine

## 2023-07-19 ENCOUNTER — Encounter: Payer: Self-pay | Admitting: Family Medicine

## 2023-07-19 DIAGNOSIS — R29898 Other symptoms and signs involving the musculoskeletal system: Secondary | ICD-10-CM

## 2023-07-19 DIAGNOSIS — R531 Weakness: Secondary | ICD-10-CM

## 2023-07-25 ENCOUNTER — Other Ambulatory Visit (HOSPITAL_BASED_OUTPATIENT_CLINIC_OR_DEPARTMENT_OTHER): Payer: Self-pay

## 2023-07-25 MED ORDER — DIAZEPAM 10 MG PO TABS
10.0000 mg | ORAL_TABLET | Freq: Every day | ORAL | 0 refills | Status: DC
  Filled 2023-07-25: qty 3, 3d supply, fill #0

## 2023-07-26 ENCOUNTER — Other Ambulatory Visit: Payer: Self-pay

## 2023-07-29 ENCOUNTER — Other Ambulatory Visit (HOSPITAL_BASED_OUTPATIENT_CLINIC_OR_DEPARTMENT_OTHER): Payer: Self-pay

## 2023-07-29 MED ORDER — PREDNISONE 10 MG PO TABS
ORAL_TABLET | ORAL | 0 refills | Status: DC
  Filled 2023-07-29: qty 21, 6d supply, fill #0

## 2023-08-01 ENCOUNTER — Telehealth: Payer: Self-pay | Admitting: Diagnostic Neuroimaging

## 2023-08-01 NOTE — Telephone Encounter (Signed)
Pt's wife calling to verify appt date asking how appt was scheduled. I offered another date and time but pt's wife decided to keep current appt for 08/02/23.

## 2023-08-02 ENCOUNTER — Ambulatory Visit: Payer: Commercial Managed Care - PPO | Admitting: Diagnostic Neuroimaging

## 2023-08-02 ENCOUNTER — Encounter: Payer: Self-pay | Admitting: Diagnostic Neuroimaging

## 2023-08-02 VITALS — BP 125/69 | HR 99 | Ht 64.0 in | Wt 183.6 lb

## 2023-08-02 DIAGNOSIS — F1020 Alcohol dependence, uncomplicated: Secondary | ICD-10-CM

## 2023-08-02 DIAGNOSIS — I350 Nonrheumatic aortic (valve) stenosis: Secondary | ICD-10-CM | POA: Diagnosis not present

## 2023-08-02 DIAGNOSIS — G312 Degeneration of nervous system due to alcohol: Secondary | ICD-10-CM | POA: Diagnosis not present

## 2023-08-02 NOTE — Progress Notes (Signed)
[facility]  PATIENT: Juan [name] DOB: January 25, 1966  REFERRING CLINICIAN: Arby Barrette, MD HISTORY FROM: Patient REASON FOR VISIT: New consult   HISTORICAL  CHIEF COMPLAINT:  Chief Complaint  Patient presents with   Follow-up    Patient in room #7 and alone. Patient states he doing with his in home physical therapy.    HISTORY OF PRESENT ILLNESS:   UPDATE (08/02/23, VRP): Since last visit, was doing well, until Oct 2024. Hospitalized for mental health and later for fevers. Gait also  gradually worsened over last 1-2 years. Now using walker x 1 year. Trying to increase protein intake. Working with home PT now. Has been sober since last visit, except 1 time about a month ago. Denies any numbness, shortness of breath, chest pain.   PRIOR HPI (11/05/20, VRP): 57 year old male here for evaluation of muscle weakness.  Patient had muscle weakness for least 2 to 3 years, went to [facility] neurology had EMG nerve conduction study and lab testing, and was diagnosed with alcohol-related neuropathy.  Patient had prior chronic alcohol abuse, now abstinent for past 1 year.  Patient's insurance changed and therefore needed neurology follow-up.  Patient feels like his muscle weakness is still persistent.  He was exercising at the [facility] until 3 months ago when he stopped due to having to take her his daughter who had some medical issues.  Recent CK and aldolase testing have normalized compared to prior labs.  He mainly eats frozen meals at home, and has purchased some protein powder to increase intake.  PRIOR HPI (12/19/19, Dr. Ander Gaster): "Juan [name] is a 57 y.o. male who presents with a history of proximal upper and lower extremity weakness, proximal leg pain. He has had a nerve conduction study and EMG which showed a mild myopathic process of proximal muscles. He has a myopathy, likely due to history of alcohol use given absence of other positive lab work-up. Recommend  continued abstinence from alcohol and physical therapy when possible. We have discussed nutrition and multivitamin daily."     REVIEW OF SYSTEMS: Full 14 system review of systems performed and negative with exception of: As per HPI.  ALLERGIES: Allergies  Allergen Reactions   Apple Juice Swelling and Other (See Comments)    Tongue swelling   Cucumber Extract Itching and Nausea And Vomiting    No extracts; just cucumber   Depakote Er [Divalproex Sodium Er] Swelling and Other (See Comments)    Tongue swelling   Depakote [Valproic Acid] Anaphylaxis   Peanut Butter Flavoring Agent (Non-Screening) Anaphylaxis and Swelling   Peanut Oil Swelling   Peanut-Containing Drug Products Swelling   Shellfish Allergy Itching and Swelling   Shrimp Extract Itching and Swelling   Strawberry Extract Nausea And Vomiting and Swelling   Apple Swelling   Cantaloupe Extract Allergy Skin Test Rash   Codeine Itching, Rash and Other (See Comments)    Patient reports he can take "CODONES" without problems   Firvanq [Vancomycin] Rash   Lactose Intolerance (Gi) Diarrhea and Other (See Comments)    Indigestion, Stomach pain, Flatulence    HOME MEDICATIONS: Outpatient Medications Prior to Visit  Medication Sig Dispense Refill   busPIRone (BUSPAR) 15 MG tablet Take 1 tablet (15 mg total) by mouth 2 (two) times daily. 180 tablet 2   diazepam (VALIUM) 10 MG tablet Take 1 tablet by mouth two hours before dental appointment and bring the rest with you to your appointment.  Do not drive while taking. 3 tablet  0   diclofenac Sodium (VOLTAREN) 1 % GEL Apply 4 grams topically 4 (four) times daily to affected joint. 100 g 11   hydrOXYzine (ATARAX) 25 MG tablet Take 0.5-1 tablets (12.5-25 mg total) by mouth every 8 (eight) hours as needed for itching. 30 tablet 1   lactulose, encephalopathy, (CHRONULAC) 10 GM/15ML SOLN Take 10 g by mouth daily as needed (constipation).     magnesium oxide (MAG-OX) 400 MG tablet Take 1  tablet (400 mg total) by mouth 2 (two) times daily. 120 tablet 2   nicotine (NICODERM CQ - DOSED IN MG/24 HOURS) 21 mg/24hr patch Apply patch to non-hairy, clean, dry skin on upper body between the neck and waist. Remove the previous day's patch before applying new patch. Rotate or switch site where apply daily. If you have intense, clear, troubling dreams or your cannot fall asleep only wear patch 16 hrs per day with removal at bedtime. 56 patch 0   ondansetron (ZOFRAN-ODT) 8 MG disintegrating tablet Dissolve 1 tablet (8 mg total) by mouth every 8 (eight) hours as needed for nausea. 30 tablet 1   pantoprazole (PROTONIX) 40 MG tablet Take 1 tablet (40 mg total) by mouth daily. 30 tablet 1   potassium chloride SA (KLOR-CON M) 20 MEQ tablet Take 1 tablet (20 mEq total) by mouth 2 (two) times daily. 180 tablet 1   predniSONE (DELTASONE) 10 MG tablet Take 6 tablets (60 mg total) by mouth on day 1, then 5 tablets (50 mg total) on day 2, then 4 tablets (40 mg total) on day 3, then 3 tablets (30 mg total) on day 4, then 2 tablets (20 mg total) on day 5, and then 1 tablet (10 mg total) on day 6. 21 tablet 0   QUEtiapine (SEROQUEL) 50 MG tablet Take 0.5-1 tablets (25-50 mg total) by mouth at bedtime. 90 tablet 0   saccharomyces boulardii (FLORASTOR) 250 MG capsule Take 250 mg by mouth 2 (two) times daily.      vortioxetine HBr (TRINTELLIX) 10 MG TABS tablet Take 1 tablet (10 mg total) by mouth daily. 30 tablet 3   furosemide (LASIX) 40 MG tablet Take 1 tablet (40 mg) by mouth daily. (Patient not taking: Reported on 06/19/2023) 30 tablet 1   No facility-administered medications prior to visit.    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   Chronic fatigue    Cirrhosis (HCC)    Colon polyps    Depression    GERD (gastroesophageal  reflux disease)    Hemochromatosis    Hiatal hernia 02/20/2022   Hx of blood clots    Leg   Hyperreflexia    Hypertension    PTSD (post-traumatic stress disorder)    Traumatic hemorrhagic shock (HCC)     PAST SURGICAL HISTORY: Past Surgical History:  Procedure Laterality Date   BIOPSY  09/24/2021   Procedure: BIOPSY;  Surgeon: Imogene Burn, MD;  Location: [facility] ENDOSCOPY;  Service: Gastroenterology;;   Fidela Salisbury RELEASE Bilateral    COLONOSCOPY WITH PROPOFOL N/A 09/24/2021   Procedure: COLONOSCOPY WITH PROPOFOL;  Surgeon: Imogene Burn, MD;  Location: [facility] ENDOSCOPY;  Service: Gastroenterology;  Laterality: N/A;   ESOPHAGOGASTRODUODENOSCOPY (EGD) WITH PROPOFOL N/A 09/24/2021   Procedure: ESOPHAGOGASTRODUODENOSCOPY (EGD) WITH PROPOFOL;  Surgeon: Imogene Burn, MD;  Location[facility] ENDOSCOPY;  Service: Gastroenterology;  Laterality: N/A;   ESOPHAGOGASTRODUODENOSCOPY (EGD) WITH PROPOFOL N/A 08/18/2022   Procedure: ESOPHAGOGASTRODUODENOSCOPY (EGD) WITH PROPOFOL;  Surgeon: Hilarie Fredrickson, MD;  Location: [facility] ENDOSCOPY;  Service: Gastroenterology;  Laterality: N/A;   FLEXIBLE SIGMOIDOSCOPY N/A 08/18/2022   Procedure: FLEXIBLE SIGMOIDOSCOPY;  Surgeon: Hilarie Fredrickson, MD;  Location: [facility] ENDOSCOPY;  Service: Gastroenterology;  Laterality: N/A;   FRACTURE SURGERY     left ankle plate   HERNIA REPAIR     inguinal   KNEE SURGERY Right    x 4   SHOULDER SURGERY Bilateral    x 2   VASECTOMY      FAMILY HISTORY: Family History  Problem Relation Age of Onset   Pulmonary fibrosis Mother    Hypertension Father    Other Father        liver failure   Diabetes Brother    Breast cancer Paternal Aunt    Colon cancer Neg Hx    Esophageal cancer Neg Hx    Stomach cancer Neg Hx    Rectal cancer Neg Hx     SOCIAL HISTORY: Social History   Socioeconomic History   Marital status: Married    Spouse name: [name]   Number of children: 2   Years of education: Not on file   Highest education level:  12th grade  Occupational History    Comment: disability  Tobacco Use   Smoking status: Never   Smokeless tobacco: Current    Types: Snuff  Vaping Use   Vaping status: Never Used  Substance and Sexual Activity   Alcohol use: Not Currently   Drug use: Never   Sexual activity: Yes    Partners: Female  Other Topics Concern   Not on file  Social History Narrative   Lives with wife   Social Determinants of Health   Financial Resource Strain: Medium Risk (01/09/2023)   Overall Financial Resource Strain (CARDIA)    Difficulty of Paying Living Expenses: Somewhat hard  Food Insecurity: No Food Insecurity (06/19/2023)   Hunger Vital Sign    Worried About Running Out of Food in the Last Year: Never true    Ran Out of Food in the Last Year: Never true  Transportation Needs: No Transportation Needs (06/19/2023)   PRAPARE - Administrator, Civil Service (Medical): No    Lack of Transportation (Non-Medical): No  Physical Activity: Insufficiently Active (01/09/2023)   Exercise Vital Sign    Days of Exercise per Week: 1 day    Minutes of Exercise per Session: 10 min  Stress: Stress Concern Present (01/09/2023)   [employer] of Occupational Health - Occupational Stress Questionnaire    Feeling of Stress : To some extent  Social Connections: Moderately Integrated (01/09/2023)   Social Connection and Isolation Panel [NHANES]    Frequency of Communication with Friends and Family: More than three times a week    Frequency of Social Gatherings with Friends and Family: Once a week    Attends Religious Services: More than 4 times per year    Active Member of [employer] or Organizations: No    Attends Banker Meetings: Not on file    Marital Status: Married  Catering manager Violence: Not At Risk (06/19/2023)   Humiliation, Afraid, Rape, and Kick questionnaire    Fear of Current or Ex-Partner: No    Emotionally Abused: No    Physically Abused: No    Sexually Abused: No      PHYSICAL EXAM  GENERAL EXAM/CONSTITUTIONAL: Vitals:  Vitals:   08/02/23 0840  BP: 125/69  Pulse: 99  Weight: 183 lb 9.6 oz (83.3 kg)  Height: 5\' 4"  (1.626 m)   Body mass index is 31.51 kg/m. Wt Readings from Last 3 Encounters:  08/02/23 183 lb 9.6 oz (83.3 kg)  07/11/23 178 lb (80.7 kg)  06/27/23 185 lb (83.9 kg)   Patient is in no distress; well developed, nourished and groomed; neck is supple  CARDIOVASCULAR: Examination of carotid arteries is normal; no carotid bruits Regular rate and rhythm; SYSTOLIC HEART MURMUR Examination of peripheral vascular system by observation and palpation is normal  EYES: Ophthalmoscopic exam of optic discs and posterior segments is normal; no papilledema or hemorrhages No results found.  MUSCULOSKELETAL: Gait, strength, tone, movements noted in Neurologic exam below  NEUROLOGIC: MENTAL STATUS:      No data to display         awake, alert, oriented to person, place and time recent and remote memory intact normal attention and concentration language fluent, comprehension intact, naming intact fund of knowledge appropriate  CRANIAL NERVE:  2nd - no papilledema on fundoscopic exam 2nd, 3rd, 4th, 6th - pupils equal and reactive to light, visual fields full to confrontation, extraocular muscles intact, no nystagmus 5th - facial sensation symmetric 7th - facial strength symmetric 8th - hearing intact 9th - palate elevates symmetrically, uvula midline 11th - shoulder shrug symmetric 12th - tongue protrusion midline MILD DYSARTHRIA  MOTOR:  normal bulk and tone BUE 5 BLE 5, EXCEPT LEFT DF 4+  SENSORY:  normal and symmetric to light touch, temperature, vibration  COORDINATION:  finger-nose-finger, fine finger movements SLOW MILD ATAXIA; LOWER EXT MOD ATAXIA HEEL SHIN  REFLEXES:  deep tendon reflexes TRACE and symmetric  GAIT/STATION:  WIDE based gait; MILD LEFT FOOT DROP; USING WALKER     DIAGNOSTIC DATA  (LABS, IMAGING, TESTING) - I reviewed patient records, labs, notes, testing and imaging myself where available.  Lab Results  Component Value Date   WBC 5.7 06/21/2023   HGB 7.3 (L) 06/21/2023   HCT 23.1 (L) 06/21/2023   MCV 79.9 (L) 06/21/2023   PLT 51 (L) 06/21/2023      Component Value Date/Time   NA 140 06/21/2023 0514   NA 137 06/02/2023 1537   K 3.6 06/21/2023 0514   CL 111 06/21/2023 0514   CO2 23 06/21/2023 0514   GLUCOSE 102 (H) 06/21/2023 0514   BUN 11 06/21/2023 0514   BUN 10 06/02/2023 1537   CREATININE 0.97 06/21/2023 0514   CREATININE 0.63 (L) 10/01/2022 1148   CALCIUM 7.9 (L) 06/21/2023 0514   PROT 5.1 (L) 06/20/2023 0455   PROT 6.1 06/02/2023 1537   ALBUMIN 2.3 (L) 06/20/2023 0455   ALBUMIN 3.1 (L) 06/02/2023 1537   AST 50 (H) 06/20/2023 0455   ALT 19 06/20/2023 0455   ALKPHOS 72 06/20/2023 0455   BILITOT 2.3 (H) 06/20/2023 0455   BILITOT 2.6 (H) 06/02/2023 1537   GFRNONAA >60 06/21/2023 0514   GFRNONAA 101 01/06/2021 0000   GFRAA 117 01/06/2021 0000   Lab Results  Component Value Date   CHOL 130 05/12/2023   HDL 29 (L) 05/12/2023   LDLCALC 84 05/12/2023   TRIG 84 05/12/2023   CHOLHDL 5.2 (H) 04/09/2022   Lab Results  Component Value Date   HGBA1C 5.1 04/08/2021   Lab Results  Component Value Date   VITAMINB12 >2000 (H) 05/12/2023   Lab Results  Component Value Date   TSH 1.20 04/09/2022  Lab Results  Component Value Date   CKTOTAL 66 05/27/2023    05/05/22 TTE 1. Left ventricular ejection fraction, by estimation, is 60 to 65%. The  left ventricle has normal function. The left ventricle has no regional  wall motion abnormalities. Left ventricular diastolic parameters are  consistent with Grade II diastolic  dysfunction (pseudonormalization).   2. Right ventricular systolic function is normal. The right ventricular  size is normal. There is normal pulmonary artery systolic pressure. The  estimated right ventricular systolic  pressure is 22.6 mmHg.   3. Left atrial size was mildly dilated.   4. Right atrial size was mildly dilated.   5. The mitral valve is normal in structure. Trivial mitral valve  regurgitation. No evidence of mitral stenosis.   6. The aortic valve is tricuspid. There is moderate calcification of the  aortic valve. Aortic valve regurgitation is not visualized. Mild to  moderate aortic valve stenosis. Aortic valve mean gradient measures 20.0  mmHg.   7. The inferior vena cava is normal in size with <50% respiratory  variability, suggesting right atrial pressure of 8 mmHg.    05/25/23 CT head [I reviewed images myself and agree with interpretation. -VRP]  - Brain: There is no evidence of an acute infarct, intracranial hemorrhage, mass, midline shift, or extra-axial fluid collection. Age advanced brain atrophy most notably affecting the cerebellum is unchanged. - No evidence of acute intracranial abnormality.    ASSESSMENT AND PLAN  57 y.o. year old male here with:  Dx:  1. Nonrheumatic aortic valve stenosis   2. Alcoholic cerebellar degeneration (HCC)     PLAN:  Alcohol cerebellar degeneration (dysarthria, ataxia) - continue etoh abstinence, vitamin support - continue home PT exercises - return to exercise / training [facility]) when able - monitor / increase protein intake for strength training  SYSTOLIC HEART MURMUR (mild-moderate aortic stenosis and calcification) - recommend cardiology consult  Orders Placed This Encounter  Procedures   Ambulatory referral to Cardiology   Return for return to PCP, pending if symptoms worsen or fail to improve.    Suanne Marker, MD 08/02/2023, 9:41 AM Certified in Neurology, Neurophysiology and Neuroimaging  [facility] [address] [place], [place] [address] [phone]

## 2023-08-04 DIAGNOSIS — F419 Anxiety disorder, unspecified: Secondary | ICD-10-CM | POA: Diagnosis not present

## 2023-08-04 DIAGNOSIS — F339 Major depressive disorder, recurrent, unspecified: Secondary | ICD-10-CM | POA: Diagnosis not present

## 2023-09-01 ENCOUNTER — Ambulatory Visit: Payer: Self-pay | Admitting: Family Medicine

## 2023-09-01 NOTE — Telephone Encounter (Signed)
Forwarding to provider. Please advise, thanks.

## 2023-09-01 NOTE — Telephone Encounter (Signed)
 Copied from CRM [id number]. Topic: Clinical - Red Word Triage >> Sep 01, 2023  1:20 PM Juan [name] wrote: [facility] that prompted transfer to Nurse Triage: Blood in Urine  Nose bleeds, dark urine, drink lots of water, no issue with bowel movements, no problem urinating  Chief Complaint: suspected unresolved UTI Symptoms: dark urine almost like orange juice, frequency, urgency Frequency: continual Pertinent Negatives: Patient denies pain, fever Disposition: [] ED /[] Urgent Care (no appt availability in office) / [] Appointment(In office/virtual)/ []  [facility]/ [] Home Care/ [x] Refused Recommended Disposition /[] [place] [facility]/ [x]  Follow-up with PCP Additional Notes: Pt reporting that his urine has been very dark, almost like orange juice or darker, been experiencing urgency and frequency with urination. Pt reporting wife is urology nurse, wife concerned for potential blood in urine with dark color. Pt confirms no red or pink color, no visible blood seen. Pt confirms no pain or fever. Pt reporting diagnosed with UTI 3-4 weeks ago at hospital and don't think took antibiotics like supposed to, symptoms not really eased up since then. Pt also reporting nose bleeds. Advised pt be seen within 24 hours, no availability with PCP. Pt refuses UC or other offices. Pt refusing appt today, willing to do tomorrow, preferring to wait until 1/13 when appt with Dr. ONEIDA. Nurse told pt that nurse would message office to see if could move up appt time. Placed upcoming appt on wait list. Please advise on when pt can be seen.   Reason for Disposition  Urinating more frequently than usual (i.e., frequency)  Answer Assessment - Initial Assessment Questions 1. SYMPTOM: What's the main symptom you're concerned about? (e.g., frequency, incontinence)     Urgency, frequency, dark urine, very dark urine almost like orange juice 2. ONSET: When did the  symptoms  start?     3-4 weeks ago diagnosed with  UTI at hospital, not really eased up since then 3. PAIN: Is there any pain? If Yes, ask: How bad is it? (Scale: 1-10; mild, moderate, severe)     No pain 4. CAUSE: What do you think is causing the symptoms?     Thinking didn't take antibiotics the way supposed to 5. OTHER SYMPTOMS: Do you have any other symptoms? (e.g., blood in urine, fever, flank pain, pain with urination)     No fever or pain. Wife is urology nurse and concerned for blood in urine with how dark it is  Protocols used: Urinary Symptoms-A-AH

## 2023-09-02 NOTE — Telephone Encounter (Signed)
 Pt scheduled to come in on 09/05/23

## 2023-09-05 ENCOUNTER — Ambulatory Visit: Payer: Commercial Managed Care - PPO | Admitting: Sports Medicine

## 2023-09-06 ENCOUNTER — Other Ambulatory Visit (INDEPENDENT_AMBULATORY_CARE_PROVIDER_SITE_OTHER): Payer: Commercial Managed Care - PPO

## 2023-09-06 ENCOUNTER — Ambulatory Visit (INDEPENDENT_AMBULATORY_CARE_PROVIDER_SITE_OTHER): Payer: Commercial Managed Care - PPO | Admitting: Sports Medicine

## 2023-09-06 ENCOUNTER — Institutional Professional Consult (permissible substitution): Payer: Commercial Managed Care - PPO | Admitting: Diagnostic Neuroimaging

## 2023-09-06 DIAGNOSIS — M1731 Unilateral post-traumatic osteoarthritis, right knee: Secondary | ICD-10-CM

## 2023-09-06 MED ORDER — TRIAMCINOLONE ACETONIDE 40 MG/ML IJ SUSP
40.0000 mg | Freq: Once | INTRAMUSCULAR | Status: AC
  Administered 2023-09-06: 40 mg via INTRAMUSCULAR

## 2023-09-06 NOTE — Addendum Note (Signed)
 Addended by: Carren Rang A on: 09/06/2023 04:14 PM   Modules accepted: Orders

## 2023-09-06 NOTE — Assessment & Plan Note (Signed)
 Pleasant 58 year old male, known posttraumatic osteoarthritis right knee, we did Orthovisc and the summertime of last year, he did not respond, today we did a steroid injection right knee, we also discussed geniculate artery embolization. Return to see me in 6 weeks, if insufficient improvement we will proceed with geniculate artery embolization, otherwise we can do this up to 4 times a year.

## 2023-09-06 NOTE — Progress Notes (Signed)
    Procedures performed today:    Procedure: Real-time Ultrasound Guided injection of the right knee Device: Samsung HS60  Verbal informed consent obtained.  Time-out conducted.  Noted no overlying erythema, induration, or other signs of local infection.  Skin prepped in a sterile fashion.  Local anesthesia: Topical Ethyl chloride.  With sterile technique and under real time ultrasound guidance: Trace effusion noted, 1 cc Kenalog  40, 2 cc lidocaine , 2 cc bupivacaine injected easily Completed without difficulty  Advised to call if fevers/chills, erythema, induration, drainage, or persistent bleeding.  Images permanently stored and available for review in PACS.  Impression: Technically successful ultrasound guided injection.  Independent interpretation of notes and tests performed by another provider:   None.  Brief History, Exam, Impression, and Recommendations:    Post-traumatic osteoarthritis of right knee Pleasant 58 year old male, known posttraumatic osteoarthritis right knee, we did Orthovisc and the summertime of last year, he did not respond, today we did a steroid injection right knee, we also discussed geniculate artery embolization. Return to see me in 6 weeks, if insufficient improvement we will proceed with geniculate artery embolization, otherwise we can do this up to 4 times a year.    ____________________________________________ Debby PARAS. Curtis, M.D., ABFM., CAQSM., AME. Primary Care and Sports Medicine [facility]  Adjunct Professor of [facility] Medicine  [employer]  Restaurant Manager, [employer]

## 2023-09-06 NOTE — Code Documentation (Signed)
 done

## 2023-09-11 ENCOUNTER — Emergency Department (HOSPITAL_COMMUNITY): Payer: No Typology Code available for payment source

## 2023-09-11 ENCOUNTER — Encounter (HOSPITAL_COMMUNITY): Payer: Self-pay

## 2023-09-11 ENCOUNTER — Other Ambulatory Visit: Payer: Self-pay

## 2023-09-11 ENCOUNTER — Inpatient Hospital Stay (HOSPITAL_COMMUNITY)
Admission: EM | Admit: 2023-09-11 | Discharge: 2023-09-16 | DRG: 194 | Disposition: A | Discharge status: Home Health | Payer: No Typology Code available for payment source | Attending: Internal Medicine | Admitting: Internal Medicine

## 2023-09-11 DIAGNOSIS — F1010 Alcohol abuse, uncomplicated: Secondary | ICD-10-CM | POA: Diagnosis present

## 2023-09-11 DIAGNOSIS — N179 Acute kidney failure, unspecified: Secondary | ICD-10-CM | POA: Diagnosis present

## 2023-09-11 DIAGNOSIS — F4310 Post-traumatic stress disorder, unspecified: Secondary | ICD-10-CM | POA: Diagnosis not present

## 2023-09-11 DIAGNOSIS — K7031 Alcoholic cirrhosis of liver with ascites: Secondary | ICD-10-CM | POA: Diagnosis present

## 2023-09-11 DIAGNOSIS — R9431 Abnormal electrocardiogram [ECG] [EKG]: Secondary | ICD-10-CM | POA: Diagnosis not present

## 2023-09-11 DIAGNOSIS — K219 Gastro-esophageal reflux disease without esophagitis: Secondary | ICD-10-CM | POA: Diagnosis present

## 2023-09-11 DIAGNOSIS — Z885 Allergy status to narcotic agent status: Secondary | ICD-10-CM

## 2023-09-11 DIAGNOSIS — R509 Fever, unspecified: Secondary | ICD-10-CM | POA: Diagnosis present

## 2023-09-11 DIAGNOSIS — J189 Pneumonia, unspecified organism: Principal | ICD-10-CM | POA: Diagnosis present

## 2023-09-11 DIAGNOSIS — K2100 Gastro-esophageal reflux disease with esophagitis, without bleeding: Secondary | ICD-10-CM | POA: Diagnosis present

## 2023-09-11 DIAGNOSIS — D6959 Other secondary thrombocytopenia: Secondary | ICD-10-CM | POA: Diagnosis present

## 2023-09-11 DIAGNOSIS — Z888 Allergy status to other drugs, medicaments and biological substances status: Secondary | ICD-10-CM

## 2023-09-11 DIAGNOSIS — E8809 Other disorders of plasma-protein metabolism, not elsewhere classified: Secondary | ICD-10-CM | POA: Diagnosis present

## 2023-09-11 DIAGNOSIS — Z833 Family history of diabetes mellitus: Secondary | ICD-10-CM

## 2023-09-11 DIAGNOSIS — Z91018 Allergy to other foods: Secondary | ICD-10-CM

## 2023-09-11 DIAGNOSIS — I10 Essential (primary) hypertension: Secondary | ICD-10-CM | POA: Diagnosis not present

## 2023-09-11 DIAGNOSIS — E871 Hypo-osmolality and hyponatremia: Secondary | ICD-10-CM | POA: Diagnosis present

## 2023-09-11 DIAGNOSIS — R112 Nausea with vomiting, unspecified: Secondary | ICD-10-CM | POA: Diagnosis not present

## 2023-09-11 DIAGNOSIS — E8721 Acute metabolic acidosis: Secondary | ICD-10-CM | POA: Diagnosis present

## 2023-09-11 DIAGNOSIS — F1011 Alcohol abuse, in remission: Secondary | ICD-10-CM | POA: Diagnosis present

## 2023-09-11 DIAGNOSIS — Z1152 Encounter for screening for COVID-19: Secondary | ICD-10-CM

## 2023-09-11 DIAGNOSIS — K920 Hematemesis: Secondary | ICD-10-CM | POA: Diagnosis present

## 2023-09-11 DIAGNOSIS — R7881 Bacteremia: Secondary | ICD-10-CM | POA: Diagnosis present

## 2023-09-11 DIAGNOSIS — F1090 Alcohol use, unspecified, uncomplicated: Secondary | ICD-10-CM | POA: Diagnosis present

## 2023-09-11 DIAGNOSIS — D638 Anemia in other chronic diseases classified elsewhere: Secondary | ICD-10-CM | POA: Diagnosis present

## 2023-09-11 DIAGNOSIS — R918 Other nonspecific abnormal finding of lung field: Secondary | ICD-10-CM | POA: Diagnosis not present

## 2023-09-11 DIAGNOSIS — Z881 Allergy status to other antibiotic agents status: Secondary | ICD-10-CM

## 2023-09-11 DIAGNOSIS — F3181 Bipolar II disorder: Secondary | ICD-10-CM | POA: Diagnosis present

## 2023-09-11 DIAGNOSIS — E739 Lactose intolerance, unspecified: Secondary | ICD-10-CM | POA: Diagnosis present

## 2023-09-11 DIAGNOSIS — Z86718 Personal history of other venous thrombosis and embolism: Secondary | ICD-10-CM

## 2023-09-11 DIAGNOSIS — F332 Major depressive disorder, recurrent severe without psychotic features: Secondary | ICD-10-CM | POA: Diagnosis not present

## 2023-09-11 DIAGNOSIS — R0689 Other abnormalities of breathing: Secondary | ICD-10-CM | POA: Diagnosis not present

## 2023-09-11 DIAGNOSIS — R55 Syncope and collapse: Secondary | ICD-10-CM | POA: Diagnosis not present

## 2023-09-11 DIAGNOSIS — F419 Anxiety disorder, unspecified: Secondary | ICD-10-CM | POA: Diagnosis present

## 2023-09-11 DIAGNOSIS — E8722 Chronic metabolic acidosis: Secondary | ICD-10-CM | POA: Diagnosis present

## 2023-09-11 DIAGNOSIS — F17290 Nicotine dependence, other tobacco product, uncomplicated: Secondary | ICD-10-CM | POA: Diagnosis present

## 2023-09-11 DIAGNOSIS — R531 Weakness: Principal | ICD-10-CM

## 2023-09-11 DIAGNOSIS — Z803 Family history of malignant neoplasm of breast: Secondary | ICD-10-CM

## 2023-09-11 DIAGNOSIS — B952 Enterococcus as the cause of diseases classified elsewhere: Secondary | ICD-10-CM | POA: Diagnosis present

## 2023-09-11 DIAGNOSIS — Z8249 Family history of ischemic heart disease and other diseases of the circulatory system: Secondary | ICD-10-CM

## 2023-09-11 DIAGNOSIS — Z79899 Other long term (current) drug therapy: Secondary | ICD-10-CM

## 2023-09-11 DIAGNOSIS — E876 Hypokalemia: Secondary | ICD-10-CM | POA: Diagnosis present

## 2023-09-11 DIAGNOSIS — K208 Other esophagitis without bleeding: Secondary | ICD-10-CM | POA: Diagnosis present

## 2023-09-11 DIAGNOSIS — Z91010 Allergy to peanuts: Secondary | ICD-10-CM

## 2023-09-11 DIAGNOSIS — Z91013 Allergy to seafood: Secondary | ICD-10-CM

## 2023-09-11 HISTORY — DX: Acute kidney failure, unspecified: N17.9

## 2023-09-11 LAB — URINALYSIS, ROUTINE W REFLEX MICROSCOPIC
Bilirubin Urine: NEGATIVE
Glucose, UA: NEGATIVE mg/dL
Hgb urine dipstick: NEGATIVE
Ketones, ur: NEGATIVE mg/dL
Leukocytes,Ua: NEGATIVE
Nitrite: NEGATIVE
Protein, ur: NEGATIVE mg/dL
Specific Gravity, Urine: 1.005 (ref 1.005–1.030)
pH: 7 (ref 5.0–8.0)

## 2023-09-11 LAB — AMMONIA: Ammonia: 35 umol/L (ref 9–35)

## 2023-09-11 LAB — I-STAT VENOUS BLOOD GAS, ED
Acid-Base Excess: 0 mmol/L (ref 0.0–2.0)
Bicarbonate: 21.7 mmol/L (ref 20.0–28.0)
Calcium, Ion: 0.97 mmol/L — ABNORMAL LOW (ref 1.15–1.40)
HCT: 28 % — ABNORMAL LOW (ref 39.0–52.0)
Hemoglobin: 9.5 g/dL — ABNORMAL LOW (ref 13.0–17.0)
O2 Saturation: 99 %
Potassium: 3.7 mmol/L (ref 3.5–5.1)
Sodium: 133 mmol/L — ABNORMAL LOW (ref 135–145)
TCO2: 22 mmol/L (ref 22–32)
pCO2, Ven: 24.9 mm[Hg] — ABNORMAL LOW (ref 44–60)
pH, Ven: 7.548 — ABNORMAL HIGH (ref 7.25–7.43)
pO2, Ven: 103 mm[Hg] — ABNORMAL HIGH (ref 32–45)

## 2023-09-11 LAB — CBC WITH DIFFERENTIAL/PLATELET
Abs Immature Granulocytes: 0.08 10*3/uL — ABNORMAL HIGH (ref 0.00–0.07)
Basophils Absolute: 0 10*3/uL (ref 0.0–0.1)
Basophils Relative: 0 %
Eosinophils Absolute: 0 10*3/uL (ref 0.0–0.5)
Eosinophils Relative: 0 %
HCT: 28 % — ABNORMAL LOW (ref 39.0–52.0)
Hemoglobin: 9.3 g/dL — ABNORMAL LOW (ref 13.0–17.0)
Immature Granulocytes: 1 %
Lymphocytes Relative: 12 %
Lymphs Abs: 0.9 10*3/uL (ref 0.7–4.0)
MCH: 26.5 pg (ref 26.0–34.0)
MCHC: 33.2 g/dL (ref 30.0–36.0)
MCV: 79.8 fL — ABNORMAL LOW (ref 80.0–100.0)
Monocytes Absolute: 1.5 10*3/uL — ABNORMAL HIGH (ref 0.1–1.0)
Monocytes Relative: 19 %
Neutro Abs: 5.2 10*3/uL (ref 1.7–7.7)
Neutrophils Relative %: 68 %
Platelets: 49 10*3/uL — ABNORMAL LOW (ref 150–400)
RBC: 3.51 MIL/uL — ABNORMAL LOW (ref 4.22–5.81)
RDW: 23.8 % — ABNORMAL HIGH (ref 11.5–15.5)
WBC: 7.6 10*3/uL (ref 4.0–10.5)
nRBC: 0 % (ref 0.0–0.2)

## 2023-09-11 LAB — COMPREHENSIVE METABOLIC PANEL
ALT: 25 U/L (ref 0–44)
AST: 60 U/L — ABNORMAL HIGH (ref 15–41)
Albumin: 2.4 g/dL — ABNORMAL LOW (ref 3.5–5.0)
Alkaline Phosphatase: 80 U/L (ref 38–126)
Anion gap: 15 (ref 5–15)
BUN: 13 mg/dL (ref 6–20)
CO2: 15 mmol/L — ABNORMAL LOW (ref 22–32)
Calcium: 7.9 mg/dL — ABNORMAL LOW (ref 8.9–10.3)
Chloride: 101 mmol/L (ref 98–111)
Creatinine, Ser: 1.26 mg/dL — ABNORMAL HIGH (ref 0.61–1.24)
GFR, Estimated: >60 mL/min (ref >60)
Glucose, Bld: 124 mg/dL — ABNORMAL HIGH (ref 70–99)
Potassium: 2.7 mmol/L — CL (ref 3.5–5.1)
Sodium: 131 mmol/L — ABNORMAL LOW (ref 135–145)
Total Bilirubin: 4.6 mg/dL — ABNORMAL HIGH (ref 0.0–1.2)
Total Protein: 6 g/dL — ABNORMAL LOW (ref 6.5–8.1)

## 2023-09-11 LAB — PROTIME-INR
INR: 2.1 — ABNORMAL HIGH (ref 0.8–1.2)
Prothrombin Time: 23.9 s — ABNORMAL HIGH (ref 11.4–15.2)

## 2023-09-11 LAB — I-STAT CG4 LACTIC ACID, ED: Lactic Acid, Venous: 2.6 mmol/L (ref 0.5–1.9)

## 2023-09-11 LAB — RESP PANEL BY RT-PCR (RSV, FLU A&B, COVID)  RVPGX2
Influenza A by PCR: NEGATIVE
Influenza B by PCR: NEGATIVE
Resp Syncytial Virus by PCR: NEGATIVE
SARS Coronavirus 2 by RT PCR: NEGATIVE

## 2023-09-11 LAB — PROCALCITONIN: Procalcitonin: 0.34 ng/mL

## 2023-09-11 LAB — MAGNESIUM: Magnesium: 1.1 mg/dL — ABNORMAL LOW (ref 1.7–2.4)

## 2023-09-11 LAB — LIPASE, BLOOD: Lipase: 60 U/L — ABNORMAL HIGH (ref 11–51)

## 2023-09-11 MED ORDER — PANTOPRAZOLE SODIUM 40 MG PO TBEC
40.0000 mg | DELAYED_RELEASE_TABLET | Freq: Every day | ORAL | Status: DC
  Administered 2023-09-11 – 2023-09-16 (×6): 40 mg via ORAL
  Filled 2023-09-11 (×6): qty 1

## 2023-09-11 MED ORDER — ALUM & MAG HYDROXIDE-SIMETH 200-200-20 MG/5ML PO SUSP
30.0000 mL | Freq: Once | ORAL | Status: AC
  Administered 2023-09-11: 30 mL via ORAL
  Filled 2023-09-11: qty 30

## 2023-09-11 MED ORDER — POTASSIUM CHLORIDE CRYS ER 20 MEQ PO TBCR
40.0000 meq | EXTENDED_RELEASE_TABLET | Freq: Once | ORAL | Status: AC
  Administered 2023-09-11: 40 meq via ORAL
  Filled 2023-09-11: qty 2

## 2023-09-11 MED ORDER — SODIUM CHLORIDE 0.9 % IV SOLN
1.0000 g | Freq: Once | INTRAVENOUS | Status: AC
  Administered 2023-09-11: 1 g via INTRAVENOUS
  Filled 2023-09-11: qty 10

## 2023-09-11 MED ORDER — QUETIAPINE FUMARATE 25 MG PO TABS
25.0000 mg | ORAL_TABLET | Freq: Every day | ORAL | Status: DC
Start: 2023-09-11 — End: 2023-09-16
  Administered 2023-09-11 – 2023-09-14 (×4): 25 mg via ORAL
  Administered 2023-09-15: 50 mg via ORAL
  Filled 2023-09-11 (×3): qty 1
  Filled 2023-09-11 (×2): qty 2

## 2023-09-11 MED ORDER — LACTATED RINGERS IV BOLUS
1000.0000 mL | Freq: Once | INTRAVENOUS | Status: AC
  Administered 2023-09-11: 1000 mL via INTRAVENOUS

## 2023-09-11 MED ORDER — FLEET ENEMA RE ENEM: 1.0000 | ENEMA | Freq: Once | RECTAL | Status: DC | PRN

## 2023-09-11 MED ORDER — SACCHAROMYCES BOULARDII 250 MG PO CAPS
250.0000 mg | ORAL_CAPSULE | Freq: Two times a day (BID) | ORAL | Status: DC
  Administered 2023-09-11 – 2023-09-16 (×10): 250 mg via ORAL
  Filled 2023-09-11 (×11): qty 1

## 2023-09-11 MED ORDER — HYDROXYZINE HCL 25 MG PO TABS: 12.5000 mg | ORAL_TABLET | Freq: Three times a day (TID) | ORAL | Status: DC | PRN

## 2023-09-11 MED ORDER — MAGNESIUM SULFATE 2 GM/50ML IV SOLN
2.0000 g | Freq: Once | INTRAVENOUS | Status: AC
  Administered 2023-09-11: 2 g via INTRAVENOUS
  Filled 2023-09-11: qty 50

## 2023-09-11 MED ORDER — ACETAMINOPHEN 650 MG RE SUPP: 650.0000 mg | Freq: Four times a day (QID) | RECTAL | Status: DC | PRN

## 2023-09-11 MED ORDER — SORBITOL 70 % SOLN: 30.0000 mL | Freq: Every day | Status: DC | PRN

## 2023-09-11 MED ORDER — BUSPIRONE HCL 10 MG PO TABS: 15.0000 mg | ORAL_TABLET | Freq: Two times a day (BID) | ORAL | Status: DC

## 2023-09-11 MED ORDER — ACETAMINOPHEN 325 MG PO TABS: 650.0000 mg | ORAL_TABLET | Freq: Four times a day (QID) | ORAL | Status: DC | PRN

## 2023-09-11 MED ORDER — POLYETHYLENE GLYCOL 3350 17 G PO PACK: 17.0000 g | PACK | Freq: Every day | ORAL | Status: DC | PRN

## 2023-09-11 MED ORDER — VORTIOXETINE HBR 5 MG PO TABS: 10.0000 mg | ORAL_TABLET | Freq: Every day | ORAL | Status: DC

## 2023-09-11 MED ORDER — DOXYCYCLINE HYCLATE 100 MG PO TABS
100.0000 mg | ORAL_TABLET | Freq: Once | ORAL | Status: AC
  Administered 2023-09-11: 100 mg via ORAL
  Filled 2023-09-11: qty 1

## 2023-09-11 MED ORDER — LORAZEPAM 2 MG/ML IJ SOLN
1.0000 mg | Freq: Once | INTRAMUSCULAR | Status: AC
  Administered 2023-09-11: 1 mg via INTRAVENOUS
  Filled 2023-09-11: qty 1

## 2023-09-11 MED ORDER — CEFTRIAXONE SODIUM 1 G IJ SOLR
1.0000 g | INTRAMUSCULAR | Status: DC
  Administered 2023-09-12 – 2023-09-13 (×2): 1 g via INTRAVENOUS
  Filled 2023-09-11 (×2): qty 10

## 2023-09-11 MED ORDER — SODIUM CHLORIDE 0.9% FLUSH
3.0000 mL | Freq: Two times a day (BID) | INTRAVENOUS | Status: DC
  Administered 2023-09-11 – 2023-09-16 (×11): 3 mL via INTRAVENOUS

## 2023-09-11 NOTE — ED Notes (Signed)
Pt wife needed to leave but requested that she be included in conversations pertaining to plan of care , medications and medical history.

## 2023-09-11 NOTE — H&P (Addendum)
History and Physical   Juan [name] UJW:[id number] DOB: [birth year] DOA: 09/11/2023  PCP: Everrett Coombe, DO   Patient coming from: Home  Chief Complaint: Fever  HPI: Juan [name] is a 58 y.o. male with medical history significant of hypertension, GERD, esophagitis, anemia, alcohol use, alcoholic myopathy, alcoholic cirrhosis, anxiety, bipolar, PTSD, depression, GI bleed, QTc prolongation presenting with fever.  Patient reports 3 days of fever as high as 102 at home.  Does improve with Tylenol.  He has had associated weakness and decreased p.o. intake.  Had some nausea and vomiting earlier today as well.  Denies fevers, chills, chest pain, shortness of breath, abdominal pain, constipation, diarrhea.  ED Course: Vital signs in the ED stable.  Lab workup included CMP with sodium 131, potassium 2.7, bicarb 15, creatinine elevated to 1.26 increased from baseline 0.9, glucose 124, calcium 7.9, protein 6.0, albumin 2.4, AST stable at 60.  T. bili mildly elevated from baseline at 4.6.  CBC with hemoglobin of 9.3 which is at baseline, stable thrombocytopenia at 49.  PT and INR stably elevated at 23.9 and 2.1 respectively.  Initial lactic acid 2.6 with repeat pending.  Lipase 60.  Respiratory panel for flu COVID RSV negative.  Urinalysis pending.  Ammonia normal.  Blood cultures pending.  Chest x-ray with mild density at the left base which could represent atelectasis from poor inspiration versus patchy opacity.  Patient did receive ceftriaxone and doxycycline in the ED as well as Ativan, 40 mEq p.o. potassium and 1 L IV fluids.  Review of Systems: As per HPI otherwise all other systems reviewed and are negative.  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   Chronic fatigue    Cirrhosis (HCC)    Colon polyps    Depression    GERD  (gastroesophageal reflux disease)    Hemochromatosis    Hiatal hernia 02/20/2022   Hx of blood clots    Leg   Hyperreflexia    Hypertension    PTSD (post-traumatic stress disorder)    Traumatic hemorrhagic shock [facility])     Past Surgical History:  Procedure Laterality Date   BIOPSY  09/24/2021   Procedure: BIOPSY;  Surgeon: Imogene Burn, MD;  Location: [facility] ENDOSCOPY;  Service: Gastroenterology;;   Fidela Salisbury RELEASE Bilateral    COLONOSCOPY WITH PROPOFOL N/A 09/24/2021   Procedure: COLONOSCOPY WITH PROPOFOL;  Surgeon: Imogene Burn, MD;  Location: [facility] ENDOSCOPY;  Service: Gastroenterology;  Laterality: N/A;   ESOPHAGOGASTRODUODENOSCOPY (EGD) WITH PROPOFOL N/A 09/24/2021   Procedure: ESOPHAGOGASTRODUODENOSCOPY (EGD) WITH PROPOFOL;  Surgeon: Imogene Burn, MD;  Location: [facility] ENDOSCOPY;  Service: Gastroenterology;  Laterality: N/A;   ESOPHAGOGASTRODUODENOSCOPY (EGD) WITH PROPOFOL N/A 08/18/2022   Procedure: ESOPHAGOGASTRODUODENOSCOPY (EGD) WITH PROPOFOL;  Surgeon: Hilarie Fredrickson, MD;  Location: [facility] ENDOSCOPY;  Service: Gastroenterology;  Laterality: N/A;   FLEXIBLE SIGMOIDOSCOPY N/A 08/18/2022   Procedure: FLEXIBLE SIGMOIDOSCOPY;  Surgeon: Hilarie Fredrickson, MD;  Location: [facility] ENDOSCOPY;  Service: Gastroenterology;  Laterality: N/A;   FRACTURE SURGERY     left ankle plate   HERNIA REPAIR     inguinal   KNEE SURGERY Right    x 4   SHOULDER SURGERY Bilateral    x 2   VASECTOMY      Social History  reports that he has never smoked. His smokeless tobacco use includes snuff. He reports  that he does not currently use alcohol. He reports that he does not use drugs.  Allergies  Allergen Reactions   Apple Juice Swelling and Other (See Comments)    Tongue swelling   Cucumber Extract Itching and Nausea And Vomiting    No extracts; just cucumber   Depakote Er [Divalproex Sodium Er] Swelling and Other (See Comments)    Tongue swelling   Depakote [Valproic Acid] Anaphylaxis   Peanut Butter  Flavoring Agent (Non-Screening) Anaphylaxis and Swelling   Peanut Oil Swelling   Peanut-Containing Drug Products Swelling   Shellfish Allergy Itching and Swelling   Shrimp Extract Itching and Swelling   Strawberry Extract Nausea And Vomiting and Swelling   Apple Swelling   Cantaloupe Extract Allergy Skin Test Rash   Codeine Itching, Rash and Other (See Comments)    Patient reports he can take "CODONES" without problems   Firvanq [Vancomycin] Rash   Lactose Intolerance (Gi) Diarrhea and Other (See Comments)    Indigestion, Stomach pain, Flatulence    Family History  Problem Relation Age of Onset   Pulmonary fibrosis Mother    Hypertension Father    Other Father        liver failure   Diabetes Brother    Breast cancer Paternal Aunt    Colon cancer Neg Hx    Esophageal cancer Neg Hx    Stomach cancer Neg Hx    Rectal cancer Neg Hx   Reviewed on admission  Prior to Admission medications   Medication Sig Start Date End Date Taking? Authorizing Provider  busPIRone (BUSPAR) 15 MG tablet Take 1 tablet (15 mg total) by mouth 2 (two) times daily. 11/18/22   Everrett Coombe, DO  diazepam (VALIUM) 10 MG tablet Take 1 tablet by mouth two hours before dental appointment and bring the rest with you to your appointment.  Do not drive while taking. 07/25/23     diclofenac Sodium (VOLTAREN) 1 % GEL Apply 4 grams topically 4 (four) times daily to affected joint. 02/08/23   Monica Becton, MD  furosemide (LASIX) 40 MG tablet Take 1 tablet (40 mg) by mouth daily. Patient not taking: Reported on 06/19/2023 01/20/23 05/22/23  Cathren Harsh, MD  hydrOXYzine (ATARAX) 25 MG tablet Take 0.5-1 tablets (12.5-25 mg total) by mouth every 8 (eight) hours as needed for itching. 10/19/22   Everrett Coombe, DO  lactulose, encephalopathy, (CHRONULAC) 10 GM/15ML SOLN Take 10 g by mouth daily as needed (constipation).    [provider]  magnesium oxide (MAG-OX) 400 MG tablet Take 1 tablet (400 mg total)  by mouth 2 (two) times daily. 05/13/23   Everrett Coombe, DO  nicotine (NICODERM CQ - DOSED IN MG/24 HOURS) 21 mg/24hr patch Apply patch to non-hairy, clean, dry skin on upper body between the neck and waist. Remove the previous day's patch before applying new patch. Rotate or switch site where apply daily. If you have intense, clear, troubling dreams or your cannot fall asleep only wear patch 16 hrs per day with removal at bedtime. 06/03/23     ondansetron (ZOFRAN-ODT) 8 MG disintegrating tablet Dissolve 1 tablet (8 mg total) by mouth every 8 (eight) hours as needed for nausea. 01/20/23   Rai, Ripudeep K, MD  pantoprazole (PROTONIX) 40 MG tablet Take 1 tablet (40 mg total) by mouth daily. 11/18/22 11/18/23  Imogene Burn, MD  potassium chloride SA (KLOR-CON M) 20 MEQ tablet Take 1 tablet (20 mEq total) by mouth 2 (two) times daily. 05/13/23 08/02/23  Everrett Coombe, DO  QUEtiapine (SEROQUEL) 50 MG tablet Take 0.5-1 tablets (25-50 mg total) by mouth at bedtime. 06/02/23   Everrett Coombe, DO  saccharomyces boulardii (FLORASTOR) 250 MG capsule Take 250 mg by mouth 2 (two) times daily.     [provider]  vortioxetine HBr (TRINTELLIX) 10 MG TABS tablet Take 1 tablet (10 mg total) by mouth daily. 11/22/22   Everrett Coombe, DO    Physical Exam: Vitals:   09/11/23 1045 09/11/23 1100 09/11/23 1200 09/11/23 1215  BP: 137/79 133/80 127/84 133/77  Pulse: 72 70 90 83  Resp: (!) 22 13 18  (!) 25  Temp:      TempSrc:      SpO2: 100% 99% 96% 92%  Weight:      Height:        Physical Exam Constitutional:      General: He is not in acute distress.    Appearance: Normal appearance. He is ill-appearing.  HENT:     Head: Normocephalic and atraumatic.     Mouth/Throat:     Mouth: Mucous membranes are moist.     Pharynx: Oropharynx is clear.  Eyes:     Extraocular Movements: Extraocular movements intact.     Pupils: Pupils are equal, round, and reactive to light.  Cardiovascular:     Rate and  Rhythm: Normal rate and regular rhythm.     Pulses: Normal pulses.     Heart sounds: Normal heart sounds.  Pulmonary:     Effort: Pulmonary effort is normal. No respiratory distress.     Breath sounds: Normal breath sounds.  Abdominal:     General: Bowel sounds are normal. There is no distension.     Palpations: Abdomen is soft.     Tenderness: There is abdominal tenderness in the epigastric area.  Musculoskeletal:        General: No swelling or deformity.  Skin:    General: Skin is warm and dry.  Neurological:     General: No focal deficit present.     Mental Status: Mental status is at baseline.    Labs on Admission: I have personally reviewed following labs and imaging studies  CBC: Recent Labs  Lab 09/11/23 1040  WBC 7.6  NEUTROABS 5.2  HGB 9.3*  HCT 28.0*  MCV 79.8*  PLT 49*    Basic Metabolic Panel: Recent Labs  Lab 09/11/23 1040 09/11/23 1229  NA 131*  --   K 2.7*  --   CL 101  --   CO2 15*  --   GLUCOSE 124*  --   BUN 13  --   CREATININE 1.26*  --   CALCIUM 7.9*  --   MG  --  1.1*    GFR: Estimated Creatinine Clearance: 64.5 mL/min (A) (by C-G formula based on SCr of 1.26 mg/dL (H)).  Liver Function Tests: Recent Labs  Lab 09/11/23 1040  AST 60*  ALT 25  ALKPHOS 80  BILITOT 4.6*  PROT 6.0*  ALBUMIN 2.4*    Urine analysis:    Component Value Date/Time   COLORURINE YELLOW 06/19/2023 0608   APPEARANCEUR CLOUDY (A) 06/19/2023 0608   LABSPEC 1.020 06/19/2023 0608   PHURINE 7.0 06/19/2023 0608   GLUCOSEU NEGATIVE 06/19/2023 0608   HGBUR NEGATIVE 06/19/2023 0608   BILIRUBINUR NEGATIVE 06/19/2023 0608   BILIRUBINUR small (A) 04/17/2021 1646   KETONESUR NEGATIVE 06/19/2023 0608   PROTEINUR NEGATIVE 06/19/2023 0608   UROBILINOGEN 4.0 (A) 04/17/2021 1646   NITRITE NEGATIVE 06/19/2023 6962  LEUKOCYTESUR TRACE (A) 06/19/2023 0608    Radiological Exams on Admission: DG Chest Port 1 View Result Date: 09/11/2023 CLINICAL DATA:  Weakness  EXAM: PORTABLE CHEST 1 VIEW COMPARISON:  06/19/2023 FINDINGS: Artifact overlies the chest. Possible mild cardiomegaly. Patient has not taken a deep inspiration. Right lung is clear. Mild density at the left base could relate to the poor inspiration or there could be mild left base atelectasis or patchy infiltrate. No lobar consolidation or collapse. No visible effusion. IMPRESSION: Mild density at the left base could relate to the poor inspiration or there could be mild left base atelectasis or patchy infiltrate. Electronically Signed   By: Paulina Fusi M.D.   On: 09/11/2023 11:33   EKG: Independently reviewed.  Sinus rhythm at 67 bpm.  PVCs noted.  QTc borderline at 497.  Assessment/Plan Principal Problem:   AKI (acute kidney injury) (HCC) Active Problems:   Benign essential hypertension   MDD (major depressive disorder), recurrent severe, without psychosis (HCC)   Esophagitis, [place] grade D   PTSD (post-traumatic stress disorder)   History of alcohol abuse   Alcoholic cirrhosis of liver with ascites (HCC)   GERD (gastroesophageal reflux disease)   Anemia of chronic disease   Anxiety   Bipolar 2 disorder, major depressive episode (HCC)   Alcohol use disorder   Febrile illness   AKI Hypokalemia > With several days of illness and decreased p.o. intake patient noted to have AKI with creatinine elevated to 1.26 from baseline 0.9 as well as hypokalemia 2.7. > 1 L IV fluids ordered in the ED.  40 mEq p.o. potassium ordered in the ED.  Magnesium level ordered in the ED.   - Monitoring on telemetry overnight - Continue with IV fluids overnight - Follow-up magnesium - Add additional 40 mEq p.o. potassium - Trend renal function and electrolytes  Febrile illness Rule out pneumonia > Patient presenting with several days of fever.  As high as 102 at home without response of Tylenol.  No fevers thus far in the ED. > Did have presentation for febrile illness at the end of last year that  never spiked a fever inpatient. > Chest x-ray with density at the left base possibly secondary to poor inspiration versus patchy opacity. > No leukocytosis.  Negative for flu COVID and RSV.  Did receive ceftriaxone and doxycycline in the ED. > Developed Abd pain in ED, did received dose of ceftriaxone as above. > Viral illness still symptoms likely with lack of leukocytosis.  Will hold off on further antibiotics and check procalcitonin as well as full respiratory viral panel to evaluate for viral etiology and help rule out bacterial infection. - Monitoring on telemetry as above - Trend fever curve and WBC - Check full respiratory viral panel - Procalcitonin - Continue with ceftriaxone given abd pain - Supportive care  Alcoholic cirrhosis > Reportedly has been abstaining from alcohol.  But has had positive ethanol screens in the past. > Last saw local GI in 2023 last saw [facility] transplant hepatology in February 2024.  At that time MELD score was 18. > MELD 3.0 score currently 27 . > Needs close follow-up with GI and possible inpatient consult if MELD score does not improve with treatment of acute condition. - Abd pain, Ceftriaxone as above - Trend PT/INR - Trend CMP - Currently not on diuretics - Currently lactulose prescribed as needed  Hypertension - Not currently on antihypertensives   GERD  Esophagitis - Resume home PPI  Anxiety Bipolar PTSD  Depression - Continue home Seroquel which is the medication he has been taking regularly. - Hold off on restarting BuSpar and Trintellix for now  QTc prolongation > QTc currently borderline at 497. - Avoid QTc prolonging medications - A.m. EKG  DVT prophylaxis: SCDs Code Status:   Full Family Communication:  None on admission  Disposition Plan:   Patient is from:  Home  Anticipated DC to:  Home  Anticipated DC date:  1 to 3 days  Anticipated DC barriers: None  Consults called:  None Admission status:  Observation,  telemetry  Severity of Illness: The appropriate patient status for this patient is OBSERVATION. Observation status is judged to be reasonable and necessary in order to provide the required intensity of service to ensure the patient's safety. The patient's presenting symptoms, physical exam findings, and initial radiographic and laboratory data in the context of their medical condition is felt to place them at decreased risk for further clinical deterioration. Furthermore, it is anticipated that the patient will be medically stable for discharge from the hospital within 2 midnights of admission.   Synetta Fail MD [facility]  How to contact the [facility] Attending or Consulting provider 7A - 7P or covering provider during after hours 7P -7A, for this patient?   Check the care team in [facility] and look for a) attending/consulting [facility] provider listed and b) the [facility] team listed Log into [email or web address] and use [place]'s universal password to access. If you do not have the password, please contact the hospital operator. Locate the [facility] provider you are looking for under [facility] and page to a number that you can be directly reached. If you still have difficulty reaching the provider, please page the [facility] (Director on Call) for the Hospitalists listed on amion for assistance.  09/11/2023, 1:15 PM

## 2023-09-11 NOTE — ED Triage Notes (Signed)
Pt BIB ems from home. Fever started 3 days ago, progressive weakness, poor po intake. Pt wheelchair bound. N/V started today. Syncope episode today. EMS gave 4mg  of zofran. Hx of neuropathy.

## 2023-09-11 NOTE — ED Notes (Signed)
Pts bed wet as he used the urinal  bed linen changed  admitting doctor at  the bedside

## 2023-09-11 NOTE — ED Notes (Signed)
Pt c/o abd pain an the pt is requesting more ativan

## 2023-09-11 NOTE — ED Notes (Signed)
Pt c/o nausea.  

## 2023-09-11 NOTE — ED Provider Notes (Signed)
[place] EMERGENCY DEPARTMENT AT [facility] Provider Note   CSN: [id number] Arrival date & time: 09/11/23  1024     History  Chief Complaint  Patient presents with   Weakness    Juan [name] is a 58 y.o. 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  Patient has abdominal distention, hematemesis, ascites, leg swelling, fevers, chest pain, shortness of breath.  Patient is unsure of sick contacts.  Patient had nausea vomiting does not feel he has been able to eat or drink the past few days.  Patient states he had a fever 102 at home that did resolve with Tylenol.  Patient denies dysuria.  Home Medications Prior to Admission medications   Medication Sig Start Date End Date Taking? Authorizing Provider  busPIRone (BUSPAR) 15 MG tablet Take 1 tablet (15 mg total) by mouth 2 (two) times daily. 11/18/22   Everrett Coombe, DO  diazepam (VALIUM) 10 MG tablet Take 1 tablet by mouth two hours before dental appointment and bring the rest with you to your appointment.  Do not drive while taking. 07/25/23     diclofenac Sodium (VOLTAREN) 1 % GEL Apply 4 grams topically 4 (four) times daily to affected joint. 02/08/23   Monica Becton, MD  furosemide (LASIX) 40 MG tablet Take 1 tablet (40 mg) by mouth daily. Patient not taking: Reported on 06/19/2023 01/20/23 05/22/23  Cathren Harsh, MD  hydrOXYzine (ATARAX) 25 MG tablet Take 0.5-1 tablets (12.5-25 mg total) by mouth every 8 (eight) hours as needed for itching. 10/19/22   Everrett Coombe, DO  lactulose, encephalopathy, (CHRONULAC) 10 GM/15ML SOLN Take 10 g by mouth daily as needed (constipation).    [provider]  magnesium oxide (MAG-OX) 400 MG tablet Take 1 tablet (400 mg total) by mouth 2 (two) times daily. 05/13/23   Everrett Coombe, DO  nicotine (NICODERM CQ - DOSED IN MG/24 HOURS) 21 mg/24hr patch Apply  patch to non-hairy, clean, dry skin on upper body between the neck and waist. Remove the previous day's patch before applying new patch. Rotate or switch site where apply daily. If you have intense, clear, troubling dreams or your cannot fall asleep only wear patch 16 hrs per day with removal at bedtime. 06/03/23     ondansetron (ZOFRAN-ODT) 8 MG disintegrating tablet Dissolve 1 tablet (8 mg total) by mouth every 8 (eight) hours as needed for nausea. 01/20/23   Rai, Ripudeep K, MD  pantoprazole (PROTONIX) 40 MG tablet Take 1 tablet (40 mg total) by mouth daily. 11/18/22 11/18/23  Imogene Burn, MD  potassium chloride SA (KLOR-CON M) 20 MEQ tablet Take 1 tablet (20 mEq total) by mouth 2 (two) times daily. 05/13/23 08/02/23  Everrett Coombe, DO  predniSONE (DELTASONE) 10 MG tablet Take 6 tablets (60 mg total) by mouth on day 1, then 5 tablets (50 mg total) on day 2, then 4 tablets (40 mg total) on day 3, then 3 tablets (30 mg total) on day 4, then 2 tablets (20 mg total) on day 5, and then 1 tablet (10 mg total) on day 6. 07/29/23     QUEtiapine (SEROQUEL) 50 MG tablet Take 0.5-1 tablets (25-50 mg total) by mouth at bedtime. 06/02/23   Everrett Coombe, DO  saccharomyces boulardii (FLORASTOR) 250 MG capsule Take 250 mg by mouth 2 (two) times daily.     [provider]  vortioxetine HBr (  TRINTELLIX) 10 MG TABS tablet Take 1 tablet (10 mg total) by mouth daily. 11/22/22   Everrett Coombe, DO      Allergies    Apple juice, Cucumber extract, Depakote er [divalproex sodium er], Depakote [valproic acid], Peanut butter flavoring agent (non-screening), Peanut oil, Peanut-containing drug products, Shellfish allergy, Shrimp extract, Strawberry extract, Apple, Cantaloupe extract allergy skin test, Codeine, Firvanq [vancomycin], and Lactose intolerance (gi)    Review of Systems   Review of Systems  Neurological:  Positive for weakness.    Physical Exam Updated Vital Signs BP 133/77   Pulse 83   Temp 98 F  (36.7 C) (Oral)   Resp (!) 25   Ht 5\' 5"  (1.651 m)   Wt 79.8 kg   SpO2 92%   BMI 29.29 kg/m  Physical Exam Vitals reviewed.  Constitutional:      General: He is not in acute distress.    Appearance: He is ill-appearing.     Comments: Actively nauseous  HENT:     Head: Normocephalic and atraumatic.  Eyes:     Extraocular Movements: Extraocular movements intact.     Conjunctiva/sclera: Conjunctivae normal.     Pupils: Pupils are equal, round, and reactive to light.  Cardiovascular:     Rate and Rhythm: Normal rate and regular rhythm.     Pulses: Normal pulses.     Heart sounds: Normal heart sounds.     Comments: 2+ bilateral radial/dorsalis pedis pulses with regular rate Pulmonary:     Effort: Pulmonary effort is normal. No respiratory distress.     Breath sounds: Normal breath sounds.  Abdominal:     Palpations: Abdomen is soft.     Tenderness: There is no abdominal tenderness. There is no guarding or rebound.     Comments: No ascites or peritoneal signs  Musculoskeletal:        General: Normal range of motion.     Cervical back: Normal range of motion and neck supple.     Comments: 5 out of 5 bilateral grip/leg extension strength  Skin:    General: Skin is warm and dry.     Capillary Refill: Capillary refill takes less than 2 seconds.  Neurological:     General: No focal deficit present.     Mental Status: He is alert and oriented to person, place, and time.     Comments: Sensation intact in all 4 limbs  Psychiatric:        Mood and Affect: Mood normal.     ED Results / Procedures / Treatments   Labs (all labs ordered are listed, but only abnormal results are displayed) Labs Reviewed  CBC WITH DIFFERENTIAL/PLATELET - Abnormal; Notable for the following components:      Result Value   RBC 3.51 (*)    Hemoglobin 9.3 (*)    HCT 28.0 (*)    MCV 79.8 (*)    RDW 23.8 (*)    Platelets 49 (*)    Monocytes Absolute 1.5 (*)    Abs Immature Granulocytes 0.08 (*)     All other components within normal limits  COMPREHENSIVE METABOLIC PANEL - Abnormal; Notable for the following components:   Sodium 131 (*)    Potassium 2.7 (*)    CO2 15 (*)    Glucose, Bld 124 (*)    Creatinine, Ser 1.26 (*)    Calcium 7.9 (*)    Total Protein 6.0 (*)    Albumin 2.4 (*)    AST 60 (*)  Total Bilirubin 4.6 (*)    All other components within normal limits  LIPASE, BLOOD - Abnormal; Notable for the following components:   Lipase 60 (*)    All other components within normal limits  PROTIME-INR - Abnormal; Notable for the following components:   Prothrombin Time 23.9 (*)    INR 2.1 (*)    All other components within normal limits  MAGNESIUM - Abnormal; Notable for the following components:   Magnesium 1.1 (*)    All other components within normal limits  I-STAT CG4 LACTIC ACID, ED - Abnormal; Notable for the following components:   Lactic Acid, Venous 2.6 (*)    All other components within normal limits  RESP PANEL BY RT-PCR (RSV, FLU A&B, COVID)  RVPGX2  CULTURE, BLOOD (ROUTINE X 2)  CULTURE, BLOOD (ROUTINE X 2)  AMMONIA  URINALYSIS, ROUTINE W REFLEX MICROSCOPIC  I-STAT VENOUS BLOOD GAS, ED    EKG EKG Interpretation Date/Time:  Sunday September 11 2023 10:31:40 EST Ventricular Rate:  67 PR Interval:  135 QRS Duration:  103 QT Interval:  470 QTC Calculation: 497 R Axis:   21  Text Interpretation: Sinus rhythm Multiple ventricular premature complexes Borderline prolonged QT interval Confirmed by Pricilla Loveless [phone]) on 09/11/2023 10:36:48 AM  Radiology DG Chest Port 1 View Result Date: 09/11/2023 CLINICAL DATA:  Weakness EXAM: PORTABLE CHEST 1 VIEW COMPARISON:  06/19/2023 FINDINGS: Artifact overlies the chest. Possible mild cardiomegaly. Patient has not taken a deep inspiration. Right lung is clear. Mild density at the left base could relate to the poor inspiration or there could be mild left base atelectasis or patchy infiltrate. No lobar consolidation  or collapse. No visible effusion. IMPRESSION: Mild density at the left base could relate to the poor inspiration or there could be mild left base atelectasis or patchy infiltrate. Electronically Signed   By: Paulina Fusi M.D.   On: 09/11/2023 11:33    Procedures Procedures    Medications Ordered in ED Medications  lactated ringers bolus 1,000 mL (has no administration in time range)  magnesium sulfate IVPB 2 g 50 mL (has no administration in time range)  LORazepam (ATIVAN) injection 1 mg (1 mg Intravenous Given 09/11/23 1058)  potassium chloride SA (KLOR-CON M) CR tablet 40 mEq (40 mEq Oral Given 09/11/23 1158)  cefTRIAXone (ROCEPHIN) 1 g in sodium chloride 0.9 % 100 mL IVPB (0 g Intravenous Stopped 09/11/23 1305)  doxycycline (VIBRA-TABS) tablet 100 mg (100 mg Oral Given 09/11/23 1158)    ED Course/ Medical Decision Making/ A&P                                 Medical Decision Making Amount and/or Complexity of Data Reviewed Labs: ordered. Radiology: ordered.  Risk Prescription drug management. Decision regarding hospitalization.   Juan [name] 58 y.o. presented today for weakness.  Working DDx that I considered at this time includes, but not limited to, anemia, electrolyte abnormalities, viral illness, pneumonia, ACS, CVA/TIA, spinal cord pathology, GBS, rhabdomyolysis, alcohol induced/drug-induced, sepsis, hypothyroidism, severe dehydration.  R/o DDx: viral illness, ACS, CVA/TIA, spinal cord pathology, GBS, rhabdomyolysis, alcohol induced/drug-induced, sepsis, hypothyroidism, SBP, ruptured esophageal varices, severe dehydration: These are considered less likely due to history of present illness, physical exam, labs/imaging findings  Review of prior external notes: 09/09/2023 unknown  Unique Tests and My Interpretation:  EKG: Sinus tach 7 bpm, borderline QT interval, no ST elevations or depressions noted CBC: Unremarkable CMP: Hypokalemia  2.7, CO2 15 Magnesium: 1.1 VBG:  Pending Chest x-ray: Possible pneumonia in left base Ammonia: Unremarkable PT/INR: Similar to baseline Blood cultures: Pending Lipase: Unremarkable UA: Pending  Social Determinants of Health: EtOH/Substance Abuse  Discussion with Independent Historian:  Significant other  Discussion of Management of Tests:  Alinda Money, MD Hospitalist  Risk: High: hospitalization or escalation of hospital-level care  Risk Stratification Score: none  Staffed with Criss Alvine, MD  Plan: On exam patient was in no acute distress with stable vitals.  Patient was nauseous on exam but otherwise had reassuring physical exam with no abdominal tenderness.  Will give Ativan as patient's EKG does show slightly prolonged QT.  Labs do show patient has hypokalemia and so we will correct this with p.o. potassium as patient states he feels much better after the Ativan and add on a magnesium.  Chest x-ray shows possible validation and so we will treat with IV antibiotics here for suspected atypical pneumonia.  Will do doxycycline instead of azithromycin as patient does have slightly prolonged QT.  After speaking with the attending we agree that due to patient's risk factors do feel that he would benefit from admission to [facility] to get his potassium repleted.  Will consult hospitalist for admission.  I spoke to hospitalist patient was accepted for admission.  Patient's magnesium was low and so we will replenish this as well.  Patient stable to be admitted.  This chart was dictated using voice recognition software.  Despite best efforts to proofread,  errors can occur which can change the documentation meaning.         Final Clinical Impression(s) / ED Diagnoses Final diagnoses:  Weakness  Hypokalemia  Pneumonia of left lower lobe due to infectious organism    Rx / DC Orders ED Discharge Orders     None         Remi Deter 09/11/23 1310    Pricilla Loveless, MD 09/15/23 [phone]

## 2023-09-12 DIAGNOSIS — N179 Acute kidney failure, unspecified: Secondary | ICD-10-CM | POA: Diagnosis not present

## 2023-09-12 LAB — COMPREHENSIVE METABOLIC PANEL
ALT: 20 U/L (ref 0–44)
AST: 48 U/L — ABNORMAL HIGH (ref 15–41)
Albumin: 2 g/dL — ABNORMAL LOW (ref 3.5–5.0)
Alkaline Phosphatase: 64 U/L (ref 38–126)
Anion gap: 6 (ref 5–15)
BUN: 12 mg/dL (ref 6–20)
CO2: 20 mmol/L — ABNORMAL LOW (ref 22–32)
Calcium: 7.4 mg/dL — ABNORMAL LOW (ref 8.9–10.3)
Chloride: 104 mmol/L (ref 98–111)
Creatinine, Ser: 1.13 mg/dL (ref 0.61–1.24)
GFR, Estimated: >60 mL/min (ref >60)
Glucose, Bld: 125 mg/dL — ABNORMAL HIGH (ref 70–99)
Potassium: 3.3 mmol/L — ABNORMAL LOW (ref 3.5–5.1)
Sodium: 130 mmol/L — ABNORMAL LOW (ref 135–145)
Total Bilirubin: 2.6 mg/dL — ABNORMAL HIGH (ref 0.0–1.2)
Total Protein: 5.2 g/dL — ABNORMAL LOW (ref 6.5–8.1)

## 2023-09-12 LAB — CBC
HCT: 24.4 % — ABNORMAL LOW (ref 39.0–52.0)
Hemoglobin: 8 g/dL — ABNORMAL LOW (ref 13.0–17.0)
MCH: 26.2 pg (ref 26.0–34.0)
MCHC: 32.8 g/dL (ref 30.0–36.0)
MCV: 80 fL (ref 80.0–100.0)
Platelets: 40 10*3/uL — ABNORMAL LOW (ref 150–400)
RBC: 3.05 MIL/uL — ABNORMAL LOW (ref 4.22–5.81)
RDW: 23.3 % — ABNORMAL HIGH (ref 11.5–15.5)
WBC: 6.7 10*3/uL (ref 4.0–10.5)
nRBC: 0 % (ref 0.0–0.2)

## 2023-09-12 LAB — PROTIME-INR
INR: 2.4 — ABNORMAL HIGH (ref 0.8–1.2)
Prothrombin Time: 26.2 s — ABNORMAL HIGH (ref 11.4–15.2)

## 2023-09-12 LAB — RESPIRATORY PANEL BY PCR

## 2023-09-12 LAB — PROCALCITONIN: Procalcitonin: 0.45 ng/mL

## 2023-09-12 LAB — MAGNESIUM: Magnesium: 1.6 mg/dL — ABNORMAL LOW (ref 1.7–2.4)

## 2023-09-12 MED ORDER — MAGNESIUM SULFATE 2 GM/50ML IV SOLN
2.0000 g | Freq: Once | INTRAVENOUS | Status: AC
  Administered 2023-09-12: 2 g via INTRAVENOUS
  Filled 2023-09-12: qty 50

## 2023-09-12 MED ORDER — POTASSIUM CHLORIDE CRYS ER 20 MEQ PO TBCR
40.0000 meq | EXTENDED_RELEASE_TABLET | ORAL | Status: AC
  Administered 2023-09-12 (×2): 40 meq via ORAL
  Filled 2023-09-12 (×2): qty 2

## 2023-09-12 NOTE — Plan of Care (Signed)
Care plan implemented

## 2023-09-12 NOTE — Plan of Care (Signed)
  Problem: Clinical Measurements: Goal: Ability to maintain clinical measurements within normal limits will improve Outcome: Progressing Goal: Will remain free from infection Outcome: Progressing   Problem: Activity: Goal: Risk for activity intolerance will decrease Outcome: Progressing   Problem: Nutrition: Goal: Adequate nutrition will be maintained Outcome: Progressing   Problem: Pain Managment: Goal: General experience of comfort will improve and/or be controlled Outcome: Progressing   Problem: Safety: Goal: Ability to remain free from injury will improve Outcome: Progressing   Problem: Skin Integrity: Goal: Risk for impaired skin integrity will decrease Outcome: Progressing

## 2023-09-12 NOTE — Progress Notes (Signed)
PROGRESS NOTE    [name]  ZOX:[id number] DOB: 05-17-1966 DOA: 09/11/2023 PCP: Everrett Coombe, DO   Brief Narrative:  HPI: Juan [name] is a 58 y.o. male with medical history significant of hypertension, GERD, esophagitis, anemia, alcohol use, alcoholic myopathy, alcoholic cirrhosis, anxiety, bipolar, PTSD, depression, GI bleed, QTc prolongation presenting with fever.   Patient reports 3 days of fever as high as 102 at home.  Does improve with Tylenol.  He has had associated weakness and decreased p.o. intake.  Had some nausea and vomiting earlier today as well.   Denies fevers, chills, chest pain, shortness of breath, abdominal pain, constipation, diarrhea.   ED Course: Vital signs in the ED stable.  Lab workup included CMP with sodium 131, potassium 2.7, bicarb 15, creatinine elevated to 1.26 increased from baseline 0.9, glucose 124, calcium 7.9, protein 6.0, albumin 2.4, AST stable at 60.  T. bili mildly elevated from baseline at 4.6.  CBC with hemoglobin of 9.3 which is at baseline, stable thrombocytopenia at 49.  PT and INR stably elevated at 23.9 and 2.1 respectively.  Initial lactic acid 2.6 with repeat pending.  Lipase 60.  Respiratory panel for flu COVID RSV negative.  Urinalysis pending.  Ammonia normal.  Blood cultures pending.  Chest x-ray with mild density at the left base which could represent atelectasis from poor inspiration versus patchy opacity.  Patient did receive ceftriaxone and doxycycline in the ED as well as Ativan, 40 mEq p.o. potassium and 1 L IV fluids.  Assessment & Plan:   Principal Problem:   AKI (acute kidney injury) (HCC) Active Problems:   Benign essential hypertension   MDD (major depressive disorder), recurrent severe, without psychosis (HCC)   Esophagitis, [place] grade D   PTSD (post-traumatic stress disorder)   History of alcohol abuse   Alcoholic cirrhosis of liver with ascites (HCC)   GERD (gastroesophageal reflux disease)   Prolonged  QT interval   Anemia of chronic disease   Anxiety   Bipolar 2 disorder, major depressive episode (HCC)   Alcohol use disorder   Febrile illness   Febrile illness/presumed community-acquired pneumonia: Patient with no respiratory symptoms, no leukocytosis and afebrile during the hospitalization but complained of fever as high as 102 at home.  Chest x-ray suspecting possible left infiltrate.  Procalcitonin slightly elevated to 0.34.  RSV, COVID and flu negative.  Full respiratory panel pending.  Continue Rocephin for presumed bacterial infection.  Cultures negative.  Of note, he Did have presentation for febrile illness at the end of last year that never spiked a fever inpatient.  We will monitor him for another 24 hours.  AKI: Due to poor p.o. intake.  Resolved with IV fluids.  Hypokalemia: Still low, will replenish.  Hypomagnesemia: Replenished yesterday but was likely not enough.  Replenish again today and rechecking magnesium.   Alcoholic cirrhosis > Reportedly has been abstaining from alcohol.  But has had positive ethanol screens in the past. > Last saw local GI in 2023 last saw [facility] hepatology in February 2024.  At that time MELD score was 18. > MELD 3.0 score currently 27 . > Needs close follow-up with GI and possible inpatient consult if MELD score does not improve with treatment of acute condition. - Abd pain, Ceftriaxone as above - Trend PT/INR - Trend CMP - Currently not on diuretics - Currently lactulose prescribed as needed   Hypertension - Not currently on antihypertensives   GERD  Esophagitis - Resume home PPI   Anxiety Bipolar  PTSD Depression - Continue home Seroquel which is the medication he has been taking regularly. - Hold off on restarting BuSpar and Trintellix for now   QTc prolongation > QTc currently borderline at 497. - Avoid QTc prolonging medications - A.m. EKG  DVT prophylaxis: SCDs Start: 09/11/23 1312   Code Status: Full Code   Family Communication:  None present at bedside.  Plan of care discussed with patient in length and he/she verbalized understanding and agreed with it.  Status is: Observation The patient will require care spanning > 2 midnights and should be moved to inpatient because: Will need another 24 hours of monitoring for possible fever.   Estimated body mass index is 29.29 kg/m as calculated from the following:   Height as of this encounter: 5\' 5"  (1.651 m).   Weight as of this encounter: 79.8 kg.    Nutritional Assessment: Body mass index is 29.29 kg/m.[place] Kitchen Seen by dietician.  I agree with the assessment and plan as outlined below: Nutrition Status:        . Skin Assessment: I have examined the patient's skin and I agree with the wound assessment as performed by the wound care RN as outlined below:    Consultants:  None  Procedures:  None  Antimicrobials:  Anti-infectives (From admission, onward)    Start     Dose/Rate Route Frequency Ordered Stop   09/12/23 1000  cefTRIAXone (ROCEPHIN) 1 g in sodium chloride 0.9 % 100 mL IVPB        1 g 200 mL/hr over 30 Minutes Intravenous Every 24 hours 09/11/23 1653     09/11/23 1145  cefTRIAXone (ROCEPHIN) 1 g in sodium chloride 0.9 % 100 mL IVPB        1 g 200 mL/hr over 30 Minutes Intravenous  Once 09/11/23 1141 09/11/23 1305   09/11/23 1145  doxycycline (VIBRA-TABS) tablet 100 mg        100 mg Oral  Once 09/11/23 1141 09/11/23 1158         Subjective: Patient seen and examined.  He says that other than generalized weakness and lethargy, he has no specific complaint.  Denies abdominal pain, shortness of breath or cough.  Objective: Vitals:   09/11/23 1732 09/11/23 2144 09/12/23 0409 09/12/23 0507  BP: 129/85 120/65 107/61 133/74  Pulse: 74 83 84 82  Resp: 18 18 20 20   Temp: 98.2 F (36.8 C) 99.1 F (37.3 C) 99.3 F (37.4 C) 98.1 F (36.7 C)  TempSrc: Oral Oral Oral Oral  SpO2: 98% 97% 96% 95%  Weight:      Height:         Intake/Output Summary (Last 24 hours) at 09/12/2023 0804 Last data filed at 09/12/2023 0409 Gross per 24 hour  Intake 1046.72 ml  Output 800 ml  Net 246.72 ml   Filed Weights   09/11/23 1031  Weight: 79.8 kg    Examination:  General exam: Appears calm and comfortable  Respiratory system: Clear to auscultation. Respiratory effort normal. Cardiovascular system: S1 & S2 heard, RRR. No JVD, murmurs, rubs, gallops or clicks. No pedal edema. Gastrointestinal system: Abdomen is nondistended, soft and nontender. No organomegaly or masses felt. Normal bowel sounds heard. Central nervous system: Alert and oriented. No focal neurological deficits. Extremities: Symmetric 5 x 5 power. Skin: No rashes, lesions or ulcers Psychiatry: Judgement and insight appear normal. Mood & affect appropriate.    Data Reviewed: I have personally reviewed following labs and imaging studies  CBC: Recent Labs  Lab 09/11/23 1040 09/11/23 1346 09/12/23 0117  WBC 7.6  --  6.7  NEUTROABS 5.2  --   --   HGB 9.3* 9.5* 8.0*  HCT 28.0* 28.0* 24.4*  MCV 79.8*  --  80.0  PLT 49*  --  40*   Basic Metabolic Panel: Recent Labs  Lab 09/11/23 1040 09/11/23 1229 09/11/23 1346 09/12/23 0117  NA 131*  --  133* 130*  K 2.7*  --  3.7 3.3*  CL 101  --   --  104  CO2 15*  --   --  20*  GLUCOSE 124*  --   --  125*  BUN 13  --   --  12  CREATININE 1.26*  --   --  1.13  CALCIUM 7.9*  --   --  7.4*  MG  --  1.1*  --   --    GFR: Estimated Creatinine Clearance: 71.9 mL/min (by C-G formula based on SCr of 1.13 mg/dL). Liver Function Tests: Recent Labs  Lab 09/11/23 1040 09/12/23 0117  AST 60* 48*  ALT 25 20  ALKPHOS 80 64  BILITOT 4.6* 2.6*  PROT 6.0* 5.2*  ALBUMIN 2.4* 2.0*   Recent Labs  Lab 09/11/23 1040  LIPASE 60*   Recent Labs  Lab 09/11/23 1100  AMMONIA 35   Coagulation Profile: Recent Labs  Lab 09/11/23 1100 09/12/23 0117  INR 2.1* 2.4*   Cardiac Enzymes: No results for  input(s): "CKTOTAL", "CKMB", "CKMBINDEX", "TROPONINI" in the last 168 hours. BNP (last 3 results) No results for input(s): "PROBNP" in the last 8760 hours. HbA1C: No results for input(s): "HGBA1C" in the last 72 hours. CBG: No results for input(s): "GLUCAP" in the last 168 hours. Lipid Profile: No results for input(s): "CHOL", "HDL", "LDLCALC", "TRIG", "CHOLHDL", "LDLDIRECT" in the last 72 hours. Thyroid Function Tests: No results for input(s): "TSH", "T4TOTAL", "FREET4", "T3FREE", "THYROIDAB" in the last 72 hours. Anemia Panel: No results for input(s): "VITAMINB12", "FOLATE", "FERRITIN", "TIBC", "IRON", "RETICCTPCT" in the last 72 hours. Sepsis Labs: Recent Labs  Lab 09/11/23 1235 09/11/23 1334 09/12/23 0117  PROCALCITON  --  0.34 0.45  LATICACIDVEN 2.6*  --   --     Recent Results (from the past 240 hours)  Resp panel by RT-PCR (RSV, Flu A&B, Covid) Anterior Nasal Swab     Status: None   Collection Time: 09/11/23 10:44 AM   Specimen: Anterior Nasal Swab  Result Value Ref Range Status   SARS Coronavirus 2 by RT PCR NEGATIVE NEGATIVE Final   Influenza A by PCR NEGATIVE NEGATIVE Final   Influenza B by PCR NEGATIVE NEGATIVE Final    Comment: (NOTE) The Xpert Xpress SARS-CoV-2/FLU/RSV plus assay is intended as an aid in the diagnosis of influenza from Nasopharyngeal swab specimens and should not be used as a sole basis for treatment. Nasal washings and aspirates are unacceptable for Xpert Xpress SARS-CoV-2/FLU/RSV testing.  Fact Sheet for Patients: BloggerCourse.com  Fact Sheet for Healthcare Providers: SeriousBroker.it  This test is not yet approved or cleared by the [place] FDA and has been authorized for detection and/or diagnosis of SARS-CoV-2 by FDA under an Emergency Use Authorization (EUA). This EUA will remain in effect (meaning this test can be used) for the duration of the COVID-19 declaration under Section  564(b)(1) of the Act, 21 U.S.C. section 360bbb-3(b)(1), unless the authorization is terminated or revoked.     Resp Syncytial Virus by PCR NEGATIVE NEGATIVE Final    Comment: (NOTE) Fact Sheet for  Patients: BloggerCourse.com  Fact Sheet for Healthcare Providers: SeriousBroker.it  This test is not yet approved or cleared by the [place] FDA and has been authorized for detection and/or diagnosis of SARS-CoV-2 by FDA under an Emergency Use Authorization (EUA). This EUA will remain in effect (meaning this test can be used) for the duration of the COVID-19 declaration under Section 564(b)(1) of the Act, 21 U.S.C. section 360bbb-3(b)(1), unless the authorization is terminated or revoked.  Performed at [facility], [address] [place], [place] [address]      Radiology Studies: DG Chest Port 1 View Result Date: 09/11/2023 CLINICAL DATA:  Weakness EXAM: PORTABLE CHEST 1 VIEW COMPARISON:  06/19/2023 FINDINGS: Artifact overlies the chest. Possible mild cardiomegaly. Patient has not taken a deep inspiration. Right lung is clear. Mild density at the left base could relate to the poor inspiration or there could be mild left base atelectasis or patchy infiltrate. No lobar consolidation or collapse. No visible effusion. IMPRESSION: Mild density at the left base could relate to the poor inspiration or there could be mild left base atelectasis or patchy infiltrate. Electronically Signed   By: Paulina Fusi M.D.   On: 09/11/2023 11:33    Scheduled Meds:  pantoprazole  40 mg Oral Daily   potassium chloride  40 mEq Oral Q4H   QUEtiapine  25-50 mg Oral QHS   saccharomyces boulardii  250 mg Oral BID   sodium chloride flush  3 mL Intravenous Q12H   Continuous Infusions:  cefTRIAXone (ROCEPHIN)  IV     magnesium sulfate bolus IVPB       LOS: 0 days   Hughie Closs, MD [facility]  09/12/2023, 8:04 AM   *Please note that  this is a verbal dictation therefore any spelling or grammatical errors are due to the "Dragon Medical One" system interpretation.  Please page via Amion and do not message via secure chat for urgent patient care matters. Secure chat can be used for non urgent patient care matters.  How to contact the [facility] Attending or Consulting provider 7A - 7P or covering provider during after hours 7P -7A, for this patient?  Check the care team in [facility] and look for a) attending/consulting [facility] provider listed and b) the [facility] team listed. Page or secure chat 7A-7P. Log into [email or web address] and use Door's universal password to access. If you do not have the password, please contact the hospital operator. Locate the [employer] provider you are looking for under [facility] and page to a number that you can be directly reached. If you still have difficulty reaching the provider, please page the [facility] (Director on Call) for the Hospitalists listed on amion for assistance.

## 2023-09-12 NOTE — Progress Notes (Signed)
   09/12/23 1312  TOC Brief Assessment  Insurance and Status Reviewed ([employer])  Patient has primary care physician  Ashley Royalty, [place], DO)  Home environment has been reviewed From home with Wife  Prior level of function: Independent  Prior/Current Home Services No current home services  Social Drivers of Health Review SDOH reviewed no interventions necessary  Readmission risk has been reviewed Yes  Transition of care needs no transition of care needs at this time   [facility] will continue to follow patient for any additional discharge needs

## 2023-09-13 ENCOUNTER — Ambulatory Visit: Payer: Commercial Managed Care - PPO

## 2023-09-13 DIAGNOSIS — F3181 Bipolar II disorder: Secondary | ICD-10-CM | POA: Diagnosis present

## 2023-09-13 DIAGNOSIS — E8721 Acute metabolic acidosis: Secondary | ICD-10-CM | POA: Diagnosis present

## 2023-09-13 DIAGNOSIS — F1010 Alcohol abuse, uncomplicated: Secondary | ICD-10-CM | POA: Diagnosis present

## 2023-09-13 DIAGNOSIS — D696 Thrombocytopenia, unspecified: Secondary | ICD-10-CM | POA: Diagnosis not present

## 2023-09-13 DIAGNOSIS — Z1152 Encounter for screening for COVID-19: Secondary | ICD-10-CM | POA: Diagnosis not present

## 2023-09-13 DIAGNOSIS — K920 Hematemesis: Secondary | ICD-10-CM | POA: Diagnosis present

## 2023-09-13 DIAGNOSIS — K2100 Gastro-esophageal reflux disease with esophagitis, without bleeding: Secondary | ICD-10-CM | POA: Diagnosis present

## 2023-09-13 DIAGNOSIS — D6959 Other secondary thrombocytopenia: Secondary | ICD-10-CM | POA: Diagnosis present

## 2023-09-13 DIAGNOSIS — N179 Acute kidney failure, unspecified: Secondary | ICD-10-CM | POA: Diagnosis not present

## 2023-09-13 DIAGNOSIS — R509 Fever, unspecified: Secondary | ICD-10-CM | POA: Diagnosis not present

## 2023-09-13 DIAGNOSIS — Z833 Family history of diabetes mellitus: Secondary | ICD-10-CM | POA: Diagnosis not present

## 2023-09-13 DIAGNOSIS — Z881 Allergy status to other antibiotic agents status: Secondary | ICD-10-CM | POA: Diagnosis not present

## 2023-09-13 DIAGNOSIS — E871 Hypo-osmolality and hyponatremia: Secondary | ICD-10-CM | POA: Diagnosis present

## 2023-09-13 DIAGNOSIS — Z8249 Family history of ischemic heart disease and other diseases of the circulatory system: Secondary | ICD-10-CM | POA: Diagnosis not present

## 2023-09-13 DIAGNOSIS — Z79899 Other long term (current) drug therapy: Secondary | ICD-10-CM | POA: Diagnosis not present

## 2023-09-13 DIAGNOSIS — E876 Hypokalemia: Secondary | ICD-10-CM | POA: Diagnosis present

## 2023-09-13 DIAGNOSIS — B952 Enterococcus as the cause of diseases classified elsewhere: Secondary | ICD-10-CM | POA: Diagnosis present

## 2023-09-13 DIAGNOSIS — E8809 Other disorders of plasma-protein metabolism, not elsewhere classified: Secondary | ICD-10-CM | POA: Diagnosis present

## 2023-09-13 DIAGNOSIS — I10 Essential (primary) hypertension: Secondary | ICD-10-CM | POA: Diagnosis present

## 2023-09-13 DIAGNOSIS — J189 Pneumonia, unspecified organism: Secondary | ICD-10-CM | POA: Diagnosis present

## 2023-09-13 DIAGNOSIS — K7031 Alcoholic cirrhosis of liver with ascites: Secondary | ICD-10-CM | POA: Diagnosis present

## 2023-09-13 DIAGNOSIS — E8722 Chronic metabolic acidosis: Secondary | ICD-10-CM | POA: Diagnosis present

## 2023-09-13 DIAGNOSIS — R7881 Bacteremia: Secondary | ICD-10-CM | POA: Diagnosis present

## 2023-09-13 DIAGNOSIS — D638 Anemia in other chronic diseases classified elsewhere: Secondary | ICD-10-CM | POA: Diagnosis present

## 2023-09-13 LAB — BLOOD CULTURE ID PANEL (REFLEXED) - BCID2

## 2023-09-13 LAB — CBC WITH DIFFERENTIAL/PLATELET
Abs Immature Granulocytes: 0.04 10*3/uL (ref 0.00–0.07)
Basophils Absolute: 0 10*3/uL (ref 0.0–0.1)
Basophils Relative: 0 %
Eosinophils Absolute: 0 10*3/uL (ref 0.0–0.5)
Eosinophils Relative: 1 %
HCT: 26.1 % — ABNORMAL LOW (ref 39.0–52.0)
Hemoglobin: 8.7 g/dL — ABNORMAL LOW (ref 13.0–17.0)
Immature Granulocytes: 1 %
Lymphocytes Relative: 15 %
Lymphs Abs: 1 10*3/uL (ref 0.7–4.0)
MCH: 26.4 pg (ref 26.0–34.0)
MCHC: 33.3 g/dL (ref 30.0–36.0)
MCV: 79.1 fL — ABNORMAL LOW (ref 80.0–100.0)
Monocytes Absolute: 1.2 10*3/uL — ABNORMAL HIGH (ref 0.1–1.0)
Monocytes Relative: 17 %
Neutro Abs: 4.6 10*3/uL (ref 1.7–7.7)
Neutrophils Relative %: 66 %
Platelets: 40 10*3/uL — ABNORMAL LOW (ref 150–400)
RBC: 3.3 MIL/uL — ABNORMAL LOW (ref 4.22–5.81)
RDW: 22.9 % — ABNORMAL HIGH (ref 11.5–15.5)
WBC: 6.9 10*3/uL (ref 4.0–10.5)
nRBC: 0 % (ref 0.0–0.2)

## 2023-09-13 LAB — COMPREHENSIVE METABOLIC PANEL
ALT: 25 U/L (ref 0–44)
AST: 62 U/L — ABNORMAL HIGH (ref 15–41)
Albumin: 2.1 g/dL — ABNORMAL LOW (ref 3.5–5.0)
Alkaline Phosphatase: 70 U/L (ref 38–126)
Anion gap: 5 (ref 5–15)
BUN: 11 mg/dL (ref 6–20)
CO2: 17 mmol/L — ABNORMAL LOW (ref 22–32)
Calcium: 7.1 mg/dL — ABNORMAL LOW (ref 8.9–10.3)
Chloride: 104 mmol/L (ref 98–111)
Creatinine, Ser: 1.22 mg/dL (ref 0.61–1.24)
GFR, Estimated: >60 mL/min (ref >60)
Glucose, Bld: 105 mg/dL — ABNORMAL HIGH (ref 70–99)
Potassium: 3.9 mmol/L (ref 3.5–5.1)
Sodium: 126 mmol/L — ABNORMAL LOW (ref 135–145)
Total Bilirubin: 3.2 mg/dL — ABNORMAL HIGH (ref 0.0–1.2)
Total Protein: 5.5 g/dL — ABNORMAL LOW (ref 6.5–8.1)

## 2023-09-13 LAB — GLUCOSE, CAPILLARY: Glucose-Capillary: 92 mg/dL (ref 70–99)

## 2023-09-13 LAB — MAGNESIUM
Magnesium: 1.4 mg/dL — ABNORMAL LOW (ref 1.7–2.4)
Magnesium: 2.9 mg/dL — ABNORMAL HIGH (ref 1.7–2.4)

## 2023-09-13 LAB — OSMOLALITY, URINE: Osmolality, Ur: 479 mosm/kg (ref 300–900)

## 2023-09-13 LAB — PROCALCITONIN: Procalcitonin: 0.38 ng/mL

## 2023-09-13 LAB — OSMOLALITY: Osmolality: 267 mosm/kg — ABNORMAL LOW (ref 275–295)

## 2023-09-13 LAB — SODIUM, URINE, RANDOM: Sodium, Ur: 70 mmol/L

## 2023-09-13 MED ORDER — MAGNESIUM SULFATE 4 GM/100ML IV SOLN
4.0000 g | Freq: Once | INTRAVENOUS | Status: AC
  Administered 2023-09-13: 4 g via INTRAVENOUS
  Filled 2023-09-13: qty 100

## 2023-09-13 MED ORDER — SODIUM CHLORIDE 0.9 % IV SOLN: Freq: Once | INTRAVENOUS | Status: AC

## 2023-09-13 MED ORDER — SODIUM CHLORIDE 0.9 % IV SOLN
2.0000 g | Freq: Four times a day (QID) | INTRAVENOUS | Status: DC
  Administered 2023-09-13 – 2023-09-14 (×3): 2 g via INTRAVENOUS
  Filled 2023-09-13 (×4): qty 2000

## 2023-09-13 MED ORDER — SODIUM CHLORIDE 0.9 % IV SOLN: INTRAVENOUS | Status: DC

## 2023-09-13 NOTE — Progress Notes (Signed)
PHARMACY - PHYSICIAN COMMUNICATION CRITICAL VALUE ALERT - BLOOD CULTURE IDENTIFICATION (BCID)  Juan [name] is an 58 y.o. male who [facility] to [facility] on [facility] with a chief complaint of fever of unknown etiology, presumed CAP.    Assessment:  1/4 anaerobic gm stain GPC E faecalis   Name of physician (or Provider) Contacted: Dr Dayna Barker  Current antibiotics: Rocephin 1 gm IV Q24h  Changes to prescribed antibiotics recommended:  STOP Rocephin, START Ampicillin.  Recommendations accepted by provider  Results for orders placed or performed during the hospital encounter of 09/11/23  Blood Culture ID Panel (Reflexed) (Collected: 09/11/2023 12:03 PM)  Result Value Ref Range   Enterococcus faecalis DETECTED (A) NOT DETECTED   Enterococcus Faecium NOT DETECTED NOT DETECTED   Listeria monocytogenes NOT DETECTED NOT DETECTED   Staphylococcus species NOT DETECTED NOT DETECTED   Staphylococcus aureus (BCID) NOT DETECTED NOT DETECTED   Staphylococcus epidermidis NOT DETECTED NOT DETECTED   Staphylococcus lugdunensis NOT DETECTED NOT DETECTED   Streptococcus species NOT DETECTED NOT DETECTED   Streptococcus agalactiae NOT DETECTED NOT DETECTED   Streptococcus pneumoniae NOT DETECTED NOT DETECTED   Streptococcus pyogenes NOT DETECTED NOT DETECTED   A.calcoaceticus-baumannii NOT DETECTED NOT DETECTED   Bacteroides fragilis NOT DETECTED NOT DETECTED   Enterobacterales NOT DETECTED NOT DETECTED   Enterobacter cloacae complex NOT DETECTED NOT DETECTED   Escherichia coli NOT DETECTED NOT DETECTED   Klebsiella aerogenes NOT DETECTED NOT DETECTED   Klebsiella oxytoca NOT DETECTED NOT DETECTED   Klebsiella pneumoniae NOT DETECTED NOT DETECTED   Proteus species NOT DETECTED NOT DETECTED   Salmonella species NOT DETECTED NOT DETECTED   Serratia marcescens NOT DETECTED NOT DETECTED   Haemophilus influenzae NOT DETECTED NOT DETECTED   Neisseria meningitidis NOT DETECTED NOT DETECTED    Pseudomonas aeruginosa NOT DETECTED NOT DETECTED   Stenotrophomonas maltophilia NOT DETECTED NOT DETECTED   Candida albicans NOT DETECTED NOT DETECTED   Candida auris NOT DETECTED NOT DETECTED   Candida glabrata NOT DETECTED NOT DETECTED   Candida krusei NOT DETECTED NOT DETECTED   Candida parapsilosis NOT DETECTED NOT DETECTED   Candida tropicalis NOT DETECTED NOT DETECTED   Cryptococcus neoformans/gattii NOT DETECTED NOT DETECTED   Vancomycin resistance NOT DETECTED NOT DETECTED    Trixie Rude, PharmD Clinical Pharmacist 09/13/2023  5:05 PM

## 2023-09-13 NOTE — Progress Notes (Signed)
Patient complained of near syncope (wanting to pass out). Administered 2 L of Oxygen. CBG 92. Gave him orange juice. Vital signs : BP 119/69, P 79, R 18 SPo2 100%. Encouraged increased hydration. MD on call notified. Pt stated he felt some improvement. . Will continue to monitor.

## 2023-09-13 NOTE — Progress Notes (Addendum)
PROGRESS NOTE    Juan [name]  ZOX:[id number] DOB: 01/03/1966 DOA: 09/11/2023 PCP: Everrett Coombe, DO   Brief Narrative:  Juan [name] is a 58 y.o. male with medical history significant of hypertension, GERD, esophagitis, anemia, alcohol use, alcoholic myopathy, alcoholic cirrhosis, anxiety, bipolar, PTSD, depression, GI bleed, QTc prolongation presented with fever for 3 days, as high as 102 at home.  Improved with Tylenol.  No other complaints.  Hemodynamically stable upon arrival to ED.  Did have some abdominal tenderness but lipase unremarkable.  RSV, COVID and flu ruled out. No leukocytosis on CBC, T. bili mildly elevated from baseline at 4.6.  CBC with hemoglobin of 9.3 which is at baseline, stable thrombocytopenia at 49.  PT and INR stably elevated at 23.9 and 2.1 respectively.  Initial lactic acid 2.6 however he has chronic lactic acidosis due to cirrhosis.  chest x-ray with mild density at the left base which could represent atelectasis from poor inspiration versus patchy opacity.  UA unremarkable.  Patient did receive ceftriaxone and doxycycline in the ED as well as Ativan, 40 mEq p.o. potassium and 1 L IV fluids.  Admitted under hospital service for possible infectious etiology of unknown source and started on Rocephin.  Assessment & Plan:   Principal Problem:   AKI (acute kidney injury) (HCC) Active Problems:   Benign essential hypertension   MDD (major depressive disorder), recurrent severe, without psychosis (HCC)   Esophagitis, [place] grade D   PTSD (post-traumatic stress disorder)   History of alcohol abuse   Alcoholic cirrhosis of liver with ascites (HCC)   GERD (gastroesophageal reflux disease)   Prolonged QT interval   Anemia of chronic disease   Anxiety   Bipolar 2 disorder, major depressive episode (HCC)   Alcohol use disorder   Febrile illness   Febrile illness/presumed community-acquired pneumonia: Patient with no respiratory symptoms, no leukocytosis  and afebrile during the hospitalization but complained of fever as high as 102 at home.  Chest x-ray suspecting possible left infiltrate.  Procalcitonin slightly elevated to 0.34.  RSV, COVID and flu as well as full respiratory viral panel negative.  He did develop fever of 101.4 around 4:30 AM today.  Continue Rocephin.  Follow blood culture which is negative.  Will add doxycycline.   Of note, he Did have presentation for febrile illness at the end of last year that never spiked a fever inpatient.  We will monitor him for another 24 hours,  Addendum/update 5:04 PM: Received call from pharmacy that [facility] ID is going Enterococcus faecalis.  They recommended ampicillin, switching to ampicillin and stopping Rocephin.  ID has been auto consulted.  AKI: Due to poor p.o. intake.  Resolved with IV fluids.  Hypokalemia: Resolved.  Dizziness/lightheadedness: Patient was complaining of dizziness and lightheadedness this morning, blood pressure was slightly on the low side.  Given half liter of normal saline bolus.  Blood pressure improved.  Will start on continuous IV fluids.  Will check orthostatics.  Hyponatremia: 131 upon arrival and 126 today.  He is asymptomatic.  Will expand hyponatremia workup, check urine and serum osmolarity and urine spot sodium.  Monitor daily.  He does appear to be slightly dry so I will start him on normal saline gentle hydration.  Hypomagnesemia: Replenished yesterday, still low, will replenish again and recheck later today.   Alcoholic cirrhosis > Reportedly has been abstaining from alcohol.  But has had positive ethanol screens in the past. > Last saw local GI in 2023 last saw [facility] hepatology  in February 2024.  At that time MELD score was 18. > MELD 3.0 score currently 27 . > Needs close follow-up with GI and possible inpatient consult if MELD score does not improve with treatment of acute condition. - Abd pain, Ceftriaxone as above - Trend PT/INR - Trend CMP -  Currently not on diuretics - Currently lactulose prescribed as needed   Hypertension - Not currently on antihypertensives   GERD  Esophagitis - Resume home PPI   Anxiety Bipolar PTSD Depression - Continue home Seroquel which is the medication he has been taking regularly. - Hold off on restarting BuSpar and Trintellix for now   QTc prolongation > QTc currently borderline at 497. - Avoid QTc prolonging medications - A.m. EKG  DVT prophylaxis: SCDs Start: 09/11/23 1312   Code Status: Full Code  Family Communication:  None present at bedside.  Plan of care discussed with patient in length and he/she verbalized understanding and agreed with it.  Also discussed with his wife over the phone.  Status is: Inpatient Remains inpatient appropriate because: Febrile early morning, source remains unclear.  Needs further monitoring.     Estimated body mass index is 29.29 kg/m as calculated from the following:   Height as of this encounter: 5\' 5"  (1.651 m).   Weight as of this encounter: 79.8 kg.    Nutritional Assessment: Body mass index is 29.29 kg/m.Marland Kitchen Seen by dietician.  I agree with the assessment and plan as outlined below: Nutrition Status:        . Skin Assessment: I have examined the patient's skin and I agree with the wound assessment as performed by the wound care RN as outlined below:    Consultants:  None  Procedures:  None  Antimicrobials:  Anti-infectives (From admission, onward)    Start     Dose/Rate Route Frequency Ordered Stop   09/12/23 1000  cefTRIAXone (ROCEPHIN) 1 g in sodium chloride 0.9 % 100 mL IVPB        1 g 200 mL/hr over 30 Minutes Intravenous Every 24 hours 09/11/23 1653     09/11/23 1145  cefTRIAXone (ROCEPHIN) 1 g in sodium chloride 0.9 % 100 mL IVPB        1 g 200 mL/hr over 30 Minutes Intravenous  Once 09/11/23 1141 09/11/23 1305   09/11/23 1145  doxycycline (VIBRA-TABS) tablet 100 mg        100 mg Oral  Once 09/11/23 1141  09/11/23 1158         Subjective: Patient seen and examined.  Other than generalized weakness, he has no complaints.  He denied any chest pain, shortness of breath, cough, nausea, vomiting, chills, sweating, any problem with urination or bowel movement.  No signs of cellulitis on any part of the body either.  Objective: Vitals:   09/12/23 1953 09/13/23 0429 09/13/23 0541 09/13/23 0645  BP: 125/73 116/79 119/69 (!) 108/57  Pulse: 86 84 79 78  Resp: 20 20 18 20   Temp: (!) 100.9 F (38.3 C) (!) 101.4 F (38.6 C) 99.2 F (37.3 C) 99.6 F (37.6 C)  TempSrc: Oral Oral Oral Oral  SpO2: 97% 98% 100% 99%  Weight:      Height:        Intake/Output Summary (Last 24 hours) at 09/13/2023 0743 Last data filed at 09/13/2023 0659 Gross per 24 hour  Intake 960 ml  Output 1100 ml  Net -140 ml   Filed Weights   09/11/23 1031  Weight: 79.8 kg  Examination:  General exam: Appears calm and comfortable  Respiratory system: Clear to auscultation. Respiratory effort normal. Cardiovascular system: S1 & S2 heard, RRR. No JVD, murmurs, rubs, gallops or clicks. No pedal edema. Gastrointestinal system: Abdomen is nondistended, soft and nontender. No organomegaly or masses felt. Normal bowel sounds heard. Central nervous system: Alert and oriented. No focal neurological deficits. Extremities: Symmetric 5 x 5 power. Skin: No rashes, lesions or ulcers.  Psychiatry: Judgement and insight appear normal. Mood & affect appropriate.    Data Reviewed: I have personally reviewed following labs and imaging studies  CBC: Recent Labs  Lab 09/11/23 1040 09/11/23 1346 09/12/23 0117  WBC 7.6  --  6.7  NEUTROABS 5.2  --   --   HGB 9.3* 9.5* 8.0*  HCT 28.0* 28.0* 24.4*  MCV 79.8*  --  80.0  PLT 49*  --  40*   Basic Metabolic Panel: Recent Labs  Lab 09/11/23 1040 09/11/23 1229 09/11/23 1346 09/12/23 0116 09/12/23 0117  NA 131*  --  133*  --  130*  K 2.7*  --  3.7  --  3.3*  CL 101  --   --    --  104  CO2 15*  --   --   --  20*  GLUCOSE 124*  --   --   --  125*  BUN 13  --   --   --  12  CREATININE 1.26*  --   --   --  1.13  CALCIUM 7.9*  --   --   --  7.4*  MG  --  1.1*  --  1.6*  --    GFR: Estimated Creatinine Clearance: 71.9 mL/min (by C-G formula based on SCr of 1.13 mg/dL). Liver Function Tests: Recent Labs  Lab 09/11/23 1040 09/12/23 0117  AST 60* 48*  ALT 25 20  ALKPHOS 80 64  BILITOT 4.6* 2.6*  PROT 6.0* 5.2*  ALBUMIN 2.4* 2.0*   Recent Labs  Lab 09/11/23 1040  LIPASE 60*   Recent Labs  Lab 09/11/23 1100  AMMONIA 35   Coagulation Profile: Recent Labs  Lab 09/11/23 1100 09/12/23 0117  INR 2.1* 2.4*   Cardiac Enzymes: No results for input(s): "CKTOTAL", "CKMB", "CKMBINDEX", "TROPONINI" in the last 168 hours. BNP (last 3 results) No results for input(s): "PROBNP" in the last 8760 hours. HbA1C: No results for input(s): "HGBA1C" in the last 72 hours. CBG: Recent Labs  Lab 09/13/23 0606  GLUCAP 92   Lipid Profile: No results for input(s): "CHOL", "HDL", "LDLCALC", "TRIG", "CHOLHDL", "LDLDIRECT" in the last 72 hours. Thyroid Function Tests: No results for input(s): "TSH", "T4TOTAL", "FREET4", "T3FREE", "THYROIDAB" in the last 72 hours. Anemia Panel: No results for input(s): "VITAMINB12", "FOLATE", "FERRITIN", "TIBC", "IRON", "RETICCTPCT" in the last 72 hours. Sepsis Labs: Recent Labs  Lab 09/11/23 1235 09/11/23 1334 09/12/23 0117  PROCALCITON  --  0.34 0.45  LATICACIDVEN 2.6*  --   --     Recent Results (from the past 240 hours)  Resp panel by RT-PCR (RSV, Flu A&B, Covid) Anterior Nasal Swab     Status: None   Collection Time: 09/11/23 10:44 AM   Specimen: Anterior Nasal Swab  Result Value Ref Range Status   SARS Coronavirus 2 by RT PCR NEGATIVE NEGATIVE Final   Influenza A by PCR NEGATIVE NEGATIVE Final   Influenza B by PCR NEGATIVE NEGATIVE Final    Comment: (NOTE) The Xpert Xpress SARS-CoV-2/FLU/RSV plus assay is intended  as an aid in  the diagnosis of influenza from Nasopharyngeal swab specimens and should not be used as a sole basis for treatment. Nasal washings and aspirates are unacceptable for Xpert Xpress SARS-CoV-2/FLU/RSV testing.  Fact Sheet for Patients: BloggerCourse.com  Fact Sheet for Healthcare Providers: SeriousBroker.it  This test is not yet approved or cleared by the [place] FDA and has been authorized for detection and/or diagnosis of SARS-CoV-2 by FDA under an Emergency Use Authorization (EUA). This EUA will remain in effect (meaning this test can be used) for the duration of the COVID-19 declaration under Section 564(b)(1) of the Act, 21 U.S.C. section 360bbb-3(b)(1), unless the authorization is terminated or revoked.     Resp Syncytial Virus by PCR NEGATIVE NEGATIVE Final    Comment: (NOTE) Fact Sheet for Patients: BloggerCourse.com  Fact Sheet for Healthcare Providers: SeriousBroker.it  This test is not yet approved or cleared by the [place] FDA and has been authorized for detection and/or diagnosis of SARS-CoV-2 by FDA under an Emergency Use Authorization (EUA). This EUA will remain in effect (meaning this test can be used) for the duration of the COVID-19 declaration under Section 564(b)(1) of the Act, 21 U.S.C. section 360bbb-3(b)(1), unless the authorization is terminated or revoked.  Performed at [facility], [address] [place], [place] [address]   Respiratory (~20 pathogens) panel by PCR     Status: None   Collection Time: 09/11/23 10:44 AM   Specimen: Nasopharyngeal Swab; Respiratory  Result Value Ref Range Status   Adenovirus NOT DETECTED NOT DETECTED Final   Coronavirus 229E NOT DETECTED NOT DETECTED Final    Comment: (NOTE) The Coronavirus on the Respiratory Panel, DOES NOT test for the novel  Coronavirus (2019 nCoV)    Coronavirus  HKU1 NOT DETECTED NOT DETECTED Final   Coronavirus NL63 NOT DETECTED NOT DETECTED Final   Coronavirus OC43 NOT DETECTED NOT DETECTED Final   Metapneumovirus NOT DETECTED NOT DETECTED Final   Rhinovirus / Enterovirus NOT DETECTED NOT DETECTED Final   Influenza A NOT DETECTED NOT DETECTED Final   Influenza B NOT DETECTED NOT DETECTED Final   Parainfluenza Virus 1 NOT DETECTED NOT DETECTED Final   Parainfluenza Virus 2 NOT DETECTED NOT DETECTED Final   Parainfluenza Virus 3 NOT DETECTED NOT DETECTED Final   Parainfluenza Virus 4 NOT DETECTED NOT DETECTED Final   Respiratory Syncytial Virus NOT DETECTED NOT DETECTED Final   Bordetella pertussis NOT DETECTED NOT DETECTED Final   Bordetella Parapertussis NOT DETECTED NOT DETECTED Final   Chlamydophila pneumoniae NOT DETECTED NOT DETECTED Final   Mycoplasma pneumoniae NOT DETECTED NOT DETECTED Final    Comment: Performed at [facility], [address], [place], [place] [address]  Culture, blood (routine x 2)     Status: None (Preliminary result)   Collection Time: 09/11/23 11:58 AM   Specimen: BLOOD LEFT HAND  Result Value Ref Range Status   Specimen Description BLOOD LEFT HAND  Final   Special Requests   Final    BOTTLES DRAWN AEROBIC AND ANAEROBIC Blood Culture results may not be optimal due to an inadequate volume of blood received in culture bottles   Culture   Final    NO GROWTH < 24 HOURS Performed at [facility], [address], [place], [place] [address]    Report Status PENDING  Incomplete  Culture, blood (routine x 2)     Status: None (Preliminary result)   Collection Time: 09/11/23 12:03 PM   Specimen: BLOOD RIGHT WRIST  Result  Value Ref Range Status   Specimen Description BLOOD RIGHT WRIST  Final   Special Requests   Final    BOTTLES DRAWN AEROBIC AND ANAEROBIC Blood Culture adequate volume   Culture   Final    NO GROWTH < 24 HOURS Performed at [facility], [address] [place], [place] [address]     Report Status PENDING  Incomplete     Radiology Studies: DG Chest Port 1 View Result Date: 09/11/2023 CLINICAL DATA:  Weakness EXAM: PORTABLE CHEST 1 VIEW COMPARISON:  06/19/2023 FINDINGS: Artifact overlies the chest. Possible mild cardiomegaly. Patient has not taken a deep inspiration. Right lung is clear. Mild density at the left base could relate to the poor inspiration or there could be mild left base atelectasis or patchy infiltrate. No lobar consolidation or collapse. No visible effusion. IMPRESSION: Mild density at the left base could relate to the poor inspiration or there could be mild left base atelectasis or patchy infiltrate. Electronically Signed   By: Paulina Fusi M.D.   On: 09/11/2023 11:33    Scheduled Meds:  pantoprazole  40 mg Oral Daily   QUEtiapine  25-50 mg Oral QHS   saccharomyces boulardii  250 mg Oral BID   sodium chloride flush  3 mL Intravenous Q12H   Continuous Infusions:  cefTRIAXone (ROCEPHIN)  IV 1 g (09/12/23 1025)     LOS: 0 days   Hughie Closs, MD [facility]  09/13/2023, 7:43 AM   *Please note that this is a verbal dictation therefore any spelling or grammatical errors are due to the "Dragon Medical One" system interpretation.  Please page via Amion and do not message via secure chat for urgent patient care matters. Secure chat can be used for non urgent patient care matters.  How to contact the [facility] Attending or Consulting provider 7A - 7P or covering provider during after hours 7P -7A, for this patient?  Check the care team in [facility] and look for a) attending/consulting [facility] provider listed and b) the [facility] team listed. Page or secure chat 7A-7P. Log into [email or web address] and use [place]'s universal password to access. If you do not have the password, please contact the hospital operator. Locate the [facility] provider you are looking for under [facility] and page to a number that you can be directly reached. If you still have difficulty  reaching the provider, please page the [facility] (Director on Call) for the Hospitalists listed on amion for assistance.

## 2023-09-14 ENCOUNTER — Inpatient Hospital Stay (HOSPITAL_COMMUNITY): Payer: No Typology Code available for payment source

## 2023-09-14 DIAGNOSIS — B952 Enterococcus as the cause of diseases classified elsewhere: Secondary | ICD-10-CM | POA: Diagnosis not present

## 2023-09-14 DIAGNOSIS — R509 Fever, unspecified: Secondary | ICD-10-CM | POA: Diagnosis not present

## 2023-09-14 DIAGNOSIS — D696 Thrombocytopenia, unspecified: Secondary | ICD-10-CM

## 2023-09-14 DIAGNOSIS — R112 Nausea with vomiting, unspecified: Secondary | ICD-10-CM

## 2023-09-14 DIAGNOSIS — R7881 Bacteremia: Secondary | ICD-10-CM

## 2023-09-14 DIAGNOSIS — N179 Acute kidney failure, unspecified: Secondary | ICD-10-CM | POA: Diagnosis not present

## 2023-09-14 LAB — COMPREHENSIVE METABOLIC PANEL
ALT: 31 U/L (ref 0–44)
AST: 86 U/L — ABNORMAL HIGH (ref 15–41)
Albumin: 1.9 g/dL — ABNORMAL LOW (ref 3.5–5.0)
Alkaline Phosphatase: 68 U/L (ref 38–126)
Anion gap: 6 (ref 5–15)
BUN: 8 mg/dL (ref 6–20)
CO2: 17 mmol/L — ABNORMAL LOW (ref 22–32)
Calcium: 6.4 mg/dL — CL (ref 8.9–10.3)
Chloride: 100 mmol/L (ref 98–111)
Creatinine, Ser: 1.2 mg/dL (ref 0.61–1.24)
GFR, Estimated: >60 mL/min (ref >60)
Glucose, Bld: 116 mg/dL — ABNORMAL HIGH (ref 70–99)
Potassium: 3.4 mmol/L — ABNORMAL LOW (ref 3.5–5.1)
Sodium: 123 mmol/L — ABNORMAL LOW (ref 135–145)
Total Bilirubin: 2.5 mg/dL — ABNORMAL HIGH (ref 0.0–1.2)
Total Protein: 5.1 g/dL — ABNORMAL LOW (ref 6.5–8.1)

## 2023-09-14 LAB — ECHOCARDIOGRAM COMPLETE
AR max vel: 1.47 cm2
AV Area VTI: 1.37 cm2
AV Area mean vel: 1.46 cm2
AV Mean grad: 18 mm[Hg]
AV Peak grad: 31.4 mm[Hg]
Ao pk vel: 2.8 m/s
Area-P 1/2: 3.95 cm2
Height: 65 in
S' Lateral: 2.9 cm
Weight: 2816 [oz_av]

## 2023-09-14 LAB — MAGNESIUM: Magnesium: 2 mg/dL (ref 1.7–2.4)

## 2023-09-14 LAB — CBC WITH DIFFERENTIAL/PLATELET
Abs Immature Granulocytes: 0.02 10*3/uL (ref 0.00–0.07)
Basophils Absolute: 0 10*3/uL (ref 0.0–0.1)
Basophils Relative: 0 %
Eosinophils Absolute: 0.2 10*3/uL (ref 0.0–0.5)
Eosinophils Relative: 2 %
HCT: 25.7 % — ABNORMAL LOW (ref 39.0–52.0)
Hemoglobin: 8.5 g/dL — ABNORMAL LOW (ref 13.0–17.0)
Immature Granulocytes: 0 %
Lymphocytes Relative: 15 %
Lymphs Abs: 1.1 10*3/uL (ref 0.7–4.0)
MCH: 25.9 pg — ABNORMAL LOW (ref 26.0–34.0)
MCHC: 33.1 g/dL (ref 30.0–36.0)
MCV: 78.4 fL — ABNORMAL LOW (ref 80.0–100.0)
Monocytes Absolute: 1.2 10*3/uL — ABNORMAL HIGH (ref 0.1–1.0)
Monocytes Relative: 16 %
Neutro Abs: 4.9 10*3/uL (ref 1.7–7.7)
Neutrophils Relative %: 67 %
Platelets: 38 10*3/uL — ABNORMAL LOW (ref 150–400)
RBC: 3.28 MIL/uL — ABNORMAL LOW (ref 4.22–5.81)
RDW: 23 % — ABNORMAL HIGH (ref 11.5–15.5)
WBC: 7.4 10*3/uL (ref 4.0–10.5)
nRBC: 0 % (ref 0.0–0.2)

## 2023-09-14 MED ORDER — SODIUM CHLORIDE 0.9 % IV SOLN
2.0000 g | INTRAVENOUS | Status: DC
  Administered 2023-09-14 – 2023-09-16 (×13): 2 g via INTRAVENOUS
  Filled 2023-09-14 (×14): qty 2000

## 2023-09-14 MED ORDER — POTASSIUM CHLORIDE CRYS ER 20 MEQ PO TBCR
40.0000 meq | EXTENDED_RELEASE_TABLET | Freq: Once | ORAL | Status: AC
  Administered 2023-09-14: 40 meq via ORAL
  Filled 2023-09-14: qty 2

## 2023-09-14 MED ORDER — CALCIUM GLUCONATE-NACL 1-0.675 GM/50ML-% IV SOLN
1.0000 g | Freq: Once | INTRAVENOUS | Status: AC
  Administered 2023-09-14: 1000 mg via INTRAVENOUS
  Filled 2023-09-14: qty 50

## 2023-09-14 MED ORDER — SODIUM BICARBONATE 650 MG PO TABS
650.0000 mg | ORAL_TABLET | Freq: Three times a day (TID) | ORAL | Status: DC
  Administered 2023-09-14 – 2023-09-15 (×6): 650 mg via ORAL
  Filled 2023-09-14 (×8): qty 1
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
## 2023-09-14 NOTE — Progress Notes (Signed)
Called by [facility] lab that patient's Calcium level is 6.4. [place],MD notified of critical result by Amber,RN. Orders received.

## 2023-09-14 NOTE — Plan of Care (Signed)

## 2023-09-14 NOTE — Progress Notes (Signed)
PROGRESS NOTE    Juan [name]  WUJ:[id number] DOB: June 29, 1966 DOA: 09/11/2023 PCP: Everrett Coombe, DO   Brief Narrative:  58 y.o. male with medical history significant of hypertension, GERD, esophagitis, anemia, alcohol use, alcoholic myopathy, alcoholic cirrhosis, anxiety, bipolar, PTSD, depression, GI bleed, QTc prolongation presented with fever for 3 days.  On presentation, RSV/COVID/influenza PCR was negative; total bili was 4.6, platelets of 49, lactic acid of 2.6.  Chest x-ray showed mild density at the left base; UA was unremarkable.  Patient received IV antibiotics in the ED.  Assessment & Plan:   Febrile illness/presumed community-acquired pneumonia Enterococcus faecalis bacteremia -Presented with fever and chest x-ray showed possible left infiltrate with mildly elevated procalcitonin.  RSV/COVID/influenza PCR negative. -Started on IV antibiotics which has been switched to IV ampicillin as per ID recommendations.  ID has been already consulted.  Follow recommendations. -No temperature spikes over the last 24 hours  AKI -Due to poor oral intake.  Resolved with IV fluids.  Acute metabolic acidosis -Remains acidotic with bicarb of 17.  Start sodium bicarbonate tablets.  Monitor  Hypokalemia -Replace.  Repeat a.m. labs  Hyponatremia -Sodium worsening to 123 today.  Currently on IV fluids.  DC IV fluids.  Start fluid restriction.  Hypomagnesemia -Resolved  Anemia of chronic disease -From chronic illnesses.  Monitor.  No signs of bleeding  Thrombocytopenia -Possibly from cirrhosis of liver.  Platelets stable.  Monitor.  No signs of bleeding  Hypoalbuminemia -Possible from cirrhosis of liver.  Follow nutrition recommendations  Alcoholic cirrhosis of liver Elevated LFTs -Reportedly has been abstaining from alcohol. 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  Outpatient follow-up with GI. -Currently not on diuretics.  Continue  lactulose  Hypertension -Currently not on antihypertensives.  Monitor blood pressure  GERD Esophagitis -Continue PPI  Anxiety/depression/bipolar disorder/PTSD -Continue home Seroquel.  BuSpar and Trintellix on hold for now.  Outpatient follow-up with psychiatry  QTc prolongation -Avoid QTc prolonging medications.  Continue monitoring on telemetry  Physical deconditioning -PT eval  DVT prophylaxis: SCDs Code Status: Full Family Communication: None at bedside Disposition Plan: Status is: Inpatient Remains inpatient appropriate because: Of severity of illnes   Consultants: None  Procedures: None  Antimicrobials:  Anti-infectives (From admission, onward)    Start     Dose/Rate Route Frequency Ordered Stop   09/14/23 0800  ampicillin (OMNIPEN) 2 g in sodium chloride 0.9 % 100 mL IVPB        2 g 300 mL/hr over 20 Minutes Intravenous Every 4 hours 09/14/23 0741     09/13/23 1800  ampicillin (OMNIPEN) 2 g in sodium chloride 0.9 % 100 mL IVPB  Status:  Discontinued        2 g 300 mL/hr over 20 Minutes Intravenous Every 6 hours 09/13/23 1705 09/14/23 0741   09/12/23 1000  cefTRIAXone (ROCEPHIN) 1 g in sodium chloride 0.9 % 100 mL IVPB  Status:  Discontinued        1 g 200 mL/hr over 30 Minutes Intravenous Every 24 hours 09/11/23 1653 09/13/23 1705   09/11/23 1145  cefTRIAXone (ROCEPHIN) 1 g in sodium chloride 0.9 % 100 mL IVPB        1 g 200 mL/hr over 30 Minutes Intravenous  Once 09/11/23 1141 09/11/23 1305   09/11/23 1145  doxycycline (VIBRA-TABS) tablet 100 mg        100 mg Oral  Once 09/11/23 1141 09/11/23 1158        Subjective: Patient seen and  examined at bedside.  No fever, vomiting, worsening shortness of breath reported.  Feels weak.  Objective: Vitals:   09/13/23 1637 09/13/23 1641 09/13/23 2110 09/14/23 0653  BP: (!) 105/57 (!) 104/55 117/70 103/60  Pulse: 83 90 83 88  Resp:   18 18  Temp:   98.4 F (36.9 C) 98.1 F (36.7 C)  TempSrc:      SpO2:    98% 97%  Weight:      Height:        Intake/Output Summary (Last 24 hours) at 09/14/2023 0802 Last data filed at 09/14/2023 0737 Gross per 24 hour  Intake 1060 ml  Output 1450 ml  Net -390 ml   Filed Weights   09/11/23 1031  Weight: 79.8 kg    Examination:  General exam: Appears calm and comfortable.  On room air. Respiratory system: Bilateral decreased breath sounds at bases Cardiovascular system: S1 & S2 heard, Rate controlled Gastrointestinal system: Abdomen is slightly distended, soft and nontender.  Bowel sounds heard. Extremities: No cyanosis, clubbing, edema  Central nervous system: Alert and oriented. No focal neurological deficits. Moving extremities Skin: No rashes, lesions or ulcers Psychiatry: Flat affect.  Not agitated.    Data Reviewed: I have personally reviewed following labs and imaging studies  CBC: Recent Labs  Lab 09/11/23 1040 09/11/23 1346 09/12/23 0117 09/13/23 0837 09/14/23 0421  WBC 7.6  --  6.7 6.9 7.4  NEUTROABS 5.2  --   --  4.6 4.9  HGB 9.3* 9.5* 8.0* 8.7* 8.5*  HCT 28.0* 28.0* 24.4* 26.1* 25.7*  MCV 79.8*  --  80.0 79.1* 78.4*  PLT 49*  --  40* 40* 38*   Basic Metabolic Panel: Recent Labs  Lab 09/11/23 1040 09/11/23 1229 09/11/23 1346 09/12/23 0116 09/12/23 0117 09/13/23 0837 09/13/23 1607 09/14/23 0421  NA 131*  --  133*  --  130* 126*  --  123*  K 2.7*  --  3.7  --  3.3* 3.9  --  3.4*  CL 101  --   --   --  104 104  --  100  CO2 15*  --   --   --  20* 17*  --  17*  GLUCOSE 124*  --   --   --  125* 105*  --  116*  BUN 13  --   --   --  12 11  --  8  CREATININE 1.26*  --   --   --  1.13 1.22  --  1.20  CALCIUM 7.9*  --   --   --  7.4* 7.1*  --  6.4*  MG  --  1.1*  --  1.6*  --  1.4* 2.9* 2.0   GFR: Estimated Creatinine Clearance: 67.7 mL/min (by C-G formula based on SCr of 1.2 mg/dL). Liver Function Tests: Recent Labs  Lab 09/11/23 1040 09/12/23 0117 09/13/23 0837 09/14/23 0421  AST 60* 48* 62* 86*  ALT 25 20 25  31   ALKPHOS 80 64 70 68  BILITOT 4.6* 2.6* 3.2* 2.5*  PROT 6.0* 5.2* 5.5* 5.1*  ALBUMIN 2.4* 2.0* 2.1* 1.9*   Recent Labs  Lab 09/11/23 1040  LIPASE 60*   Recent Labs  Lab 09/11/23 1100  AMMONIA 35   Coagulation Profile: Recent Labs  Lab 09/11/23 1100 09/12/23 0117  INR 2.1* 2.4*   Cardiac Enzymes: No results for input(s): "CKTOTAL", "CKMB", "CKMBINDEX", "TROPONINI" in the last 168 hours. BNP (last 3 results) No results for input(s): "PROBNP"  in the last 8760 hours. HbA1C: No results for input(s): "HGBA1C" in the last 72 hours. CBG: Recent Labs  Lab 09/13/23 0606  GLUCAP 92   Lipid Profile: No results for input(s): "CHOL", "HDL", "LDLCALC", "TRIG", "CHOLHDL", "LDLDIRECT" in the last 72 hours. Thyroid Function Tests: No results for input(s): "TSH", "T4TOTAL", "FREET4", "T3FREE", "THYROIDAB" in the last 72 hours. Anemia Panel: No results for input(s): "VITAMINB12", "FOLATE", "FERRITIN", "TIBC", "IRON", "RETICCTPCT" in the last 72 hours. Sepsis Labs: Recent Labs  Lab 09/11/23 1235 09/11/23 1334 09/12/23 0117 09/13/23 0837  PROCALCITON  --  0.34 0.45 0.38  LATICACIDVEN 2.6*  --   --   --     Recent Results (from the past 240 hours)  Resp panel by RT-PCR (RSV, Flu A&B, Covid) Anterior Nasal Swab     Status: None   Collection Time: 09/11/23 10:44 AM   Specimen: Anterior Nasal Swab  Result Value Ref Range Status   SARS Coronavirus 2 by RT PCR NEGATIVE NEGATIVE Final   Influenza A by PCR NEGATIVE NEGATIVE Final   Influenza B by PCR NEGATIVE NEGATIVE Final    Comment: (NOTE) The Xpert Xpress SARS-CoV-2/FLU/RSV plus assay is intended as an aid in the diagnosis of influenza from Nasopharyngeal swab specimens and should not be used as a sole basis for treatment. Nasal washings and aspirates are unacceptable for Xpert Xpress SARS-CoV-2/FLU/RSV testing.  Fact Sheet for Patients: BloggerCourse.com  Fact Sheet for Healthcare  Providers: SeriousBroker.it  This test is not yet approved or cleared by the [place] FDA and has been authorized for detection and/or diagnosis of SARS-CoV-2 by FDA under an Emergency Use Authorization (EUA). This EUA will remain in effect (meaning this test can be used) for the duration of the COVID-19 declaration under Section 564(b)(1) of the Act, 21 U.S.C. section 360bbb-3(b)(1), unless the authorization is terminated or revoked.     Resp Syncytial Virus by PCR NEGATIVE NEGATIVE Final    Comment: (NOTE) Fact Sheet for Patients: BloggerCourse.com  Fact Sheet for Healthcare Providers: SeriousBroker.it  This test is not yet approved or cleared by the [place] FDA and has been authorized for detection and/or diagnosis of SARS-CoV-2 by FDA under an Emergency Use Authorization (EUA). This EUA will remain in effect (meaning this test can be used) for the duration of the COVID-19 declaration under Section 564(b)(1) of the Act, 21 U.S.C. section 360bbb-3(b)(1), unless the authorization is terminated or revoked.  Performed at [facility], [address] [place], [place] [address]   Respiratory (~20 pathogens) panel by PCR     Status: None   Collection Time: 09/11/23 10:44 AM   Specimen: Nasopharyngeal Swab; Respiratory  Result Value Ref Range Status   Adenovirus NOT DETECTED NOT DETECTED Final   Coronavirus 229E NOT DETECTED NOT DETECTED Final    Comment: (NOTE) The Coronavirus on the Respiratory Panel, DOES NOT test for the novel  Coronavirus (2019 nCoV)    Coronavirus HKU1 NOT DETECTED NOT DETECTED Final   Coronavirus NL63 NOT DETECTED NOT DETECTED Final   Coronavirus OC43 NOT DETECTED NOT DETECTED Final   Metapneumovirus NOT DETECTED NOT DETECTED Final   Rhinovirus / Enterovirus NOT DETECTED NOT DETECTED Final   Influenza A NOT DETECTED NOT DETECTED Final   Influenza B NOT DETECTED  NOT DETECTED Final   Parainfluenza Virus 1 NOT DETECTED NOT DETECTED Final   Parainfluenza Virus 2 NOT DETECTED NOT DETECTED Final   Parainfluenza Virus 3 NOT DETECTED NOT DETECTED Final   Parainfluenza Virus 4  NOT DETECTED NOT DETECTED Final   Respiratory Syncytial Virus NOT DETECTED NOT DETECTED Final   Bordetella pertussis NOT DETECTED NOT DETECTED Final   Bordetella Parapertussis NOT DETECTED NOT DETECTED Final   Chlamydophila pneumoniae NOT DETECTED NOT DETECTED Final   Mycoplasma pneumoniae NOT DETECTED NOT DETECTED Final    Comment: Performed at [facility], [address] [place], [place] [address]  Culture, blood (routine x 2)     Status: None (Preliminary result)   Collection Time: 09/11/23 11:58 AM   Specimen: BLOOD LEFT HAND  Result Value Ref Range Status   Specimen Description BLOOD LEFT HAND  Final   Special Requests   Final    BOTTLES DRAWN AEROBIC AND ANAEROBIC Blood Culture results may not be optimal due to an inadequate volume of blood received in culture bottles   Culture   Final    NO GROWTH 2 DAYS Performed at [facility], [address], [place], [place] [address]    Report Status PENDING  Incomplete  Culture, blood (routine x 2)     Status: None (Preliminary result)   Collection Time: 09/11/23 12:03 PM   Specimen: BLOOD RIGHT WRIST  Result Value Ref Range Status   Specimen Description BLOOD RIGHT WRIST  Final   Special Requests   Final    BOTTLES DRAWN AEROBIC AND ANAEROBIC Blood Culture adequate volume   Culture  Setup Time   Final    GRAM POSITIVE COCCI IN CHAINS ANAEROBIC BOTTLE ONLY CRITICAL RESULT CALLED TO, READ BACK BY AND VERIFIED WITH: Duwaine Maxin [id number] @ 1638 FH Performed at [facility], [address], [place], [place] [address]    Culture GRAM POSITIVE COCCI IN CHAINS  Final   Report Status PENDING  Incomplete  Blood Culture ID Panel (Reflexed)     Status: Abnormal   Collection Time: 09/11/23 12:03 PM  Result Value  Ref Range Status   Enterococcus faecalis DETECTED (A) NOT DETECTED Final    Comment: PHARMD E. PAYTES [id number] @ 1638 FH   Enterococcus Faecium NOT DETECTED NOT DETECTED Final   Listeria monocytogenes NOT DETECTED NOT DETECTED Final   Staphylococcus species NOT DETECTED NOT DETECTED Final   Staphylococcus aureus (BCID) NOT DETECTED NOT DETECTED Final   Staphylococcus epidermidis NOT DETECTED NOT DETECTED Final   Staphylococcus lugdunensis NOT DETECTED NOT DETECTED Final   Streptococcus species NOT DETECTED NOT DETECTED Final   Streptococcus agalactiae NOT DETECTED NOT DETECTED Final   Streptococcus pneumoniae NOT DETECTED NOT DETECTED Final   Streptococcus pyogenes NOT DETECTED NOT DETECTED Final   A.calcoaceticus-baumannii NOT DETECTED NOT DETECTED Final   Bacteroides fragilis NOT DETECTED NOT DETECTED Final   Enterobacterales NOT DETECTED NOT DETECTED Final   Enterobacter cloacae complex NOT DETECTED NOT DETECTED Final   Escherichia coli NOT DETECTED NOT DETECTED Final   Klebsiella aerogenes NOT DETECTED NOT DETECTED Final   Klebsiella oxytoca NOT DETECTED NOT DETECTED Final   Klebsiella pneumoniae NOT DETECTED NOT DETECTED Final   Proteus species NOT DETECTED NOT DETECTED Final   Salmonella species NOT DETECTED NOT DETECTED Final   Serratia marcescens NOT DETECTED NOT DETECTED Final   Haemophilus influenzae NOT DETECTED NOT DETECTED Final   Neisseria meningitidis NOT DETECTED NOT DETECTED Final   Pseudomonas aeruginosa NOT DETECTED NOT DETECTED Final   Stenotrophomonas maltophilia NOT DETECTED NOT DETECTED Final   Candida albicans NOT DETECTED NOT DETECTED Final   Candida auris NOT DETECTED NOT DETECTED Final   Candida glabrata NOT  DETECTED NOT DETECTED Final   Candida krusei NOT DETECTED NOT DETECTED Final   Candida parapsilosis NOT DETECTED NOT DETECTED Final   Candida tropicalis NOT DETECTED NOT DETECTED Final   Cryptococcus neoformans/gattii NOT DETECTED NOT DETECTED Final    Vancomycin resistance NOT DETECTED NOT DETECTED Final    Comment: Performed at [facility], [address], [place], [place] [address]         Radiology Studies: No results found.      Scheduled Meds:  pantoprazole  40 mg Oral Daily   QUEtiapine  25-50 mg Oral QHS   saccharomyces boulardii  250 mg Oral BID   sodium chloride flush  3 mL Intravenous Q12H   Continuous Infusions:  sodium chloride 75 mL/hr at 09/14/23 0009   ampicillin (OMNIPEN) IV            Glade Lloyd, MD [facility] 09/14/2023, 8:02 AM

## 2023-09-15 ENCOUNTER — Other Ambulatory Visit (HOSPITAL_COMMUNITY): Payer: Self-pay

## 2023-09-15 DIAGNOSIS — N179 Acute kidney failure, unspecified: Secondary | ICD-10-CM | POA: Diagnosis not present

## 2023-09-15 DIAGNOSIS — B952 Enterococcus as the cause of diseases classified elsewhere: Secondary | ICD-10-CM | POA: Diagnosis not present

## 2023-09-15 DIAGNOSIS — R7881 Bacteremia: Secondary | ICD-10-CM | POA: Diagnosis not present

## 2023-09-15 LAB — CBC WITH DIFFERENTIAL/PLATELET
Abs Immature Granulocytes: 0.03 10*3/uL (ref 0.00–0.07)
Basophils Absolute: 0 10*3/uL (ref 0.0–0.1)
Basophils Relative: 0 %
Eosinophils Absolute: 0.2 10*3/uL (ref 0.0–0.5)
Eosinophils Relative: 3 %
HCT: 24.1 % — ABNORMAL LOW (ref 39.0–52.0)
Hemoglobin: 8 g/dL — ABNORMAL LOW (ref 13.0–17.0)
Immature Granulocytes: 0 %
Lymphocytes Relative: 17 %
Lymphs Abs: 1.3 10*3/uL (ref 0.7–4.0)
MCH: 26.2 pg (ref 26.0–34.0)
MCHC: 33.2 g/dL (ref 30.0–36.0)
MCV: 79 fL — ABNORMAL LOW (ref 80.0–100.0)
Monocytes Absolute: 1.1 10*3/uL — ABNORMAL HIGH (ref 0.1–1.0)
Monocytes Relative: 14 %
Neutro Abs: 5 10*3/uL (ref 1.7–7.7)
Neutrophils Relative %: 66 %
Platelets: 39 10*3/uL — ABNORMAL LOW (ref 150–400)
RBC: 3.05 MIL/uL — ABNORMAL LOW (ref 4.22–5.81)
RDW: 23.3 % — ABNORMAL HIGH (ref 11.5–15.5)
WBC: 7.7 10*3/uL (ref 4.0–10.5)
nRBC: 0 % (ref 0.0–0.2)

## 2023-09-15 LAB — COMPREHENSIVE METABOLIC PANEL
ALT: 28 U/L (ref 0–44)
AST: 68 U/L — ABNORMAL HIGH (ref 15–41)
Albumin: 1.7 g/dL — ABNORMAL LOW (ref 3.5–5.0)
Alkaline Phosphatase: 67 U/L (ref 38–126)
Anion gap: 4 — ABNORMAL LOW (ref 5–15)
BUN: 10 mg/dL (ref 6–20)
CO2: 19 mmol/L — ABNORMAL LOW (ref 22–32)
Calcium: 6.4 mg/dL — CL (ref 8.9–10.3)
Chloride: 108 mmol/L (ref 98–111)
Creatinine, Ser: 1.19 mg/dL (ref 0.61–1.24)
GFR, Estimated: >60 mL/min (ref >60)
Glucose, Bld: 95 mg/dL (ref 70–99)
Potassium: 3.5 mmol/L (ref 3.5–5.1)
Sodium: 131 mmol/L — ABNORMAL LOW (ref 135–145)
Total Bilirubin: 2.5 mg/dL — ABNORMAL HIGH (ref 0.0–1.2)
Total Protein: 4.7 g/dL — ABNORMAL LOW (ref 6.5–8.1)

## 2023-09-15 LAB — MAGNESIUM: Magnesium: 1.7 mg/dL (ref 1.7–2.4)

## 2023-09-15 LAB — CULTURE, BLOOD (ROUTINE X 2)
Culture  Setup Time: NO GROWTH
Special Requests: ADEQUATE

## 2023-09-15 MED ORDER — ENSURE ENLIVE PO LIQD
237.0000 mL | Freq: Two times a day (BID) | ORAL | Status: DC
  Administered 2023-09-16: 237 mL via ORAL

## 2023-09-15 MED ORDER — CALCIUM GLUCONATE-NACL 1-0.675 GM/50ML-% IV SOLN
1.0000 g | Freq: Once | INTRAVENOUS | Status: AC
  Administered 2023-09-15: 1000 mg via INTRAVENOUS
  Filled 2023-09-15: qty 50

## 2023-09-15 MED ORDER — AMOXICILLIN 500 MG PO CAPS
1000.0000 mg | ORAL_CAPSULE | Freq: Three times a day (TID) | ORAL | 0 refills | Status: AC
  Filled 2023-09-15: qty 72, 12d supply, fill #0

## 2023-09-15 NOTE — Evaluation (Addendum)
Physical Therapy Evaluation Patient Details Name: Juan [name] MRN: [id number] DOB: [birth year] Today's Date: 09/15/2023  History of Present Illness  The pt is a 58 yo male presenting 1/19 with fever and progressive weakness. Admitted with community acquired PNA and AKI. PMH includes: alcohol abuse, anxiety, cirrhosis, depression, HTN, PTSD, L ankle surgery, knee surgeries, and shoulder surgeries.   Clinical Impression  Pt in bed upon arrival of PT, agreeable to evaluation at this time. Prior to admission the pt was independent with use of rollator in the home. He reports he is able to manage at home alone while wife works, able to [employer] and use bathroom, but typically waits for wifes return to shower. The pt was able to complete bed mobility, sit-stand transfers, and hallway ambulation without assistance. He did use RW but had no overt LOB or need for assistance. Pt does have baseline instability in his gait related to  jerky movements in BLE, he reports working on standing balance, strength, and endurance with HHPT, and I feel it is reasonable for him to return home to continue with [facility] therapies there. He has all the DME he needs, and will have adequate supervision from family. Will continue to follow acutely to maintain mobility, but pt is safe to return home when medically stable.       5X Sit-to-Stand: 21.95 sec (> 11.4 sec indicates increased risk of falls for individuals aged 18-65, > 15 sec indicates increased risk of recurrent falls)     If plan is discharge home, recommend the following: A little help with walking and/or transfers;A little help with bathing/dressing/bathroom;Assist for transportation;Help with stairs or ramp for entrance   Can travel by private vehicle        Equipment Recommendations None recommended by PT  Recommendations for Other Services       Functional Status Assessment Patient has had a recent decline in their functional status and demonstrates the  ability to make significant improvements in function in a reasonable and predictable amount of time.     Precautions / Restrictions Precautions Precautions: Fall Restrictions Weight Bearing Restrictions Per Provider Order: No      Mobility  Bed Mobility Overal bed mobility: Modified Independent             General bed mobility comments: used rails, but no assist    Transfers Overall transfer level: Needs assistance Equipment used: Rolling walker (2 wheels) Transfers: Sit to/from Stand Sit to Stand: Supervision           General transfer comment: no assist, slow to rise. progressed to no DME by end of session    Ambulation/Gait Ambulation/Gait assistance: Contact guard assist Gait Distance (Feet): 200 Feet Assistive device: Rolling walker (2 wheels) Gait Pattern/deviations: Step-through pattern, Decreased stride length, Trunk flexed Gait velocity: decreased Gait velocity interpretation: <1.31 ft/sec, indicative of household ambulator   General Gait Details: small steps with minimal clearance. no over buckling or LOB      Balance Overall balance assessment: Mild deficits observed, not formally tested                                           Pertinent Vitals/Pain Pain Assessment Pain Assessment: No/denies pain    Home Living Family/patient expects to be discharged to:: Private residence Living Arrangements: Spouse/significant other Available Help at Discharge: Family;Available PRN/intermittently Type of Home: House Home Access: Level  entry       Home Layout: One level Home Equipment: Agricultural consultant (2 wheels);Chiropractor (4 wheels);Toilet riser      Prior Function Prior Level of Function : Needs assist             Mobility Comments: use of rollator, electric       Extremity/Trunk Assessment   Upper Extremity Assessment Upper Extremity Assessment: Overall WFL for tasks assessed    Lower Extremity  Assessment Lower Extremity Assessment: Overall WFL for tasks assessed (chronic jerking in BLE)    Cervical / Trunk Assessment Cervical / Trunk Assessment: Kyphotic  Communication   Communication Communication: No apparent difficulties Cueing Techniques: Verbal cues  Cognition Arousal: Alert Behavior During Therapy: WFL for tasks assessed/performed Overall Cognitive Status: Within Functional Limits for tasks assessed                                          General Comments General comments (skin integrity, edema, etc.): VSS    Exercises Other Exercises Other Exercises: 5x sit-stand from EOB without RW: 21.95 sec   Assessment/Plan    PT Assessment Patient needs continued PT services  PT Problem List Decreased activity tolerance;Decreased balance;Decreased mobility       PT Treatment Interventions DME instruction;Gait training;Functional mobility training;Therapeutic activities;Therapeutic exercise;Balance training    PT Goals (Current goals can be found in the Care Plan section)  Acute Rehab PT Goals Patient Stated Goal: return home asap PT Goal Formulation: With patient Time For Goal Achievement: 09/22/23 Potential to Achieve Goals: Good    Frequency Min 1X/week        AM-PAC PT "6 Clicks" Mobility  Outcome Measure Help needed turning from your back to your side while in a flat bed without using bedrails?: A Little Help needed moving from lying on your back to sitting on the side of a flat bed without using bedrails?: A Little Help needed moving to and from a bed to a chair (including a wheelchair)?: A Little Help needed standing up from a chair using your arms (e.g., wheelchair or bedside chair)?: A Little Help needed to walk in hospital room?: A Little   6 Click Score: 15    End of Session Equipment Utilized During Treatment: Gait belt Activity Tolerance: Patient tolerated treatment well Patient left: in bed;with call bell/phone within  reach Nurse Communication: Mobility status PT Visit Diagnosis: Unsteadiness on feet (R26.81);Other abnormalities of gait and mobility (R26.89);Muscle weakness (generalized) (M62.81)    Time: 1610-9604 PT Time Calculation (min) (ACUTE ONLY): 36 min   Charges:   PT Evaluation $PT Eval Moderate Complexity: 1 Mod PT Treatments $Therapeutic Exercise: 8-22 mins PT General Charges $$ ACUTE PT VISIT: 1 Visit         Vickki Muff, PT, DPT   Acute Rehabilitation Department Office [phone] Secure Chat Communication Preferred  Ronnie Derby 09/15/2023, 12:02 PM

## 2023-09-15 NOTE — Progress Notes (Signed)
   09/15/23 0703  Provider Notification  Provider Name/Title Alekh,MD  Date Provider Notified 09/15/23  Time Provider Notified 0703  Method of Notification Page  Notification Reason Critical Result (Calcium level 6.4)  Provider response See new orders  Date of Provider Response 09/15/23  Time of Provider Response [phone]

## 2023-09-15 NOTE — Progress Notes (Signed)
Initial Nutrition Assessment  DOCUMENTATION CODES:   Not applicable  INTERVENTION:  Magic cup TID with meals, each supplement provides 290 kcal and 9 grams of protein  Encourage PO intake, specifically protein Discussed importance of protein intake to maintain fluid status and build back lean body mass s/p hospitalization  NUTRITION DIAGNOSIS:  Inadequate protein intake related to acute illness as evidenced by estimated needs.   GOAL:  Patient will meet greater than or equal to 90% of their needs   MONITOR:  PO intake, Supplement acceptance  REASON FOR ASSESSMENT:  Consult Assessment of nutrition requirement/status  ASSESSMENT:   Patient admitted with fever and malaise found to have enterococcus faecalis bacteremia. PMH: HTN, GERD, esophagitis, anemia, alcohol use, alcoholic cirrhosis, alcoholic myopathy, anxiety, bipolar, PTSD, depression, GI bleed.   Appetite poor w/ documented intake averaging 12.5% intake x4 meals documented. Endorses appetite that has improved. Consumed 50% of lunch today, which he endorses is baseline over last couple of days. Appetite PTA stable. Majority of calorie/protein needs met with supplementation. Discussed importance of continued supplementation should he skip meals or not consume adequate portion of meals after discharge. Will add Magic Cup for additional protein intake and to not overshoot fluid restriction.  24-Hour Recall B: yogurt or peaches  L: skips or snacks on [employer] D: [employer] Meals - fresh made then frozen delivery meal service Snacks: Premeir Protein Shake x2 and 1[employer]are[employer]ells Bar - banana caramel flavor  UBW reported as between 75-80kg. Per chart review, he has ranged from 74-83kg over last four months at historical visits where weight was measured. Endorses no significant weight changes in last six months. Weight fluctuates based off whether "he is eating like he should or not." Some mild edema noted to BLEs. He wears compression  socks at baseline. No recent issues chewing or swallowing.   Admit Weight: 79.8kg Current Weight: 79.5kg   Intake/Output Summary (Last 24 hours) at 09/15/2023 1355 Last data filed at 09/15/2023 0733 Gross per 24 hour  Intake 1160 ml  Output 2100 ml  Net -940 ml    Net IO Since Admission: -1,223.28 mL [09/15/23 1355]   Sodium trending up, however still low. Hypokalemia resolved. Calcium and sodium repletion ongoing. On fluid restriction ongoing. Bowels moving regularly. No N/V/C/D reported at bedside.     Meds: pantoprazole, Florastor, sodium bicarbonate, IV ABX, calcium gluconate  Labs: Na+ 126 >123 >131 K+ 3.4 > 3.5 Ca 6.4 > 6.4 CBGs 95-116 over 72 hours    NUTRITION - FOCUSED PHYSICAL EXAM:  Flowsheet Row Most Recent Value  Orbital Region No depletion  Upper Arm Region No depletion  Thoracic and Lumbar Region No depletion  Buccal Region No depletion  Temple Region Mild depletion  Clavicle Bone Region Mild depletion  Clavicle and Acromion Bone Region No depletion  Scapular Bone Region No depletion  Dorsal Hand No depletion  Patellar Region No depletion  Anterior Thigh Region No depletion  Posterior Calf Region No depletion  Edema (RD Assessment) Mild  Hair Reviewed  Eyes Reviewed  Mouth Reviewed  Skin Reviewed  Nails Reviewed    Diet Order:   Diet Order             Diet Heart Room service appropriate? Yes; Fluid consistency: Thin; Fluid restriction: 1500 mL Fluid  Diet effective now             EDUCATION NEEDS:   Education needs have been addressed  Skin:  Skin Assessment: Reviewed RN Assessment  Last BM:  1/22  Height:  Ht Readings from Last 1 Encounters:  09/11/23 5\' 5"  (1.651 m)    Weight:  Wt Readings from Last 1 Encounters:  09/15/23 79.5 kg    Ideal Body Weight:  61.8 kg  BMI:  Body mass index is 29.17 kg/m.  Estimated Nutritional Needs:   Kcal:  1800-2000kcal  Protein:  85-95g  Fluid:  1.8-2L/day  Myrtie Cruise MS,  RD, LDN Registered Dietitian Clinical Nutrition RD Inpatient Contact Info in Amion

## 2023-09-15 NOTE — Progress Notes (Signed)
[facility]   Reason for visit: Follow up on Enterococcus faecalis bacteremia.  Interval History: No new positive blood cultures.  Pete blood culture sent today.  Patient feels well and tolerating p.o.  Has remained afebrile greater than 48 hours.  White blood cell count remains normal at 7.7.  Physical Exam: Constitutional:  Vitals:   09/15/23 0624 09/15/23 0915  BP: (!) 97/52 104/68  Pulse: 82 86  Resp: 18 18  Temp: 98.4 F (36.9 C)   SpO2: 95% 97%   patient appears in NAD Respiratory: Normal respiratory effort Review of Systems: Constitutional: negative for fevers and chills  Lab Results  Component Value Date   WBC 7.7 09/15/2023   HGB 8.0 (L) 09/15/2023   HCT 24.1 (L) 09/15/2023   MCV 79.0 (L) 09/15/2023   PLT 39 (L) 09/15/2023    Lab Results  Component Value Date   CREATININE 1.19 09/15/2023   BUN 10 09/15/2023   NA 131 (L) 09/15/2023   K 3.5 09/15/2023   CL 108 09/15/2023   CO2 19 (L) 09/15/2023    Lab Results  Component Value Date   ALT 28 09/15/2023   AST 68 (H) 09/15/2023   ALKPHOS 67 09/15/2023     Microbiology: Recent Results (from the past 240 hours)  Resp panel by RT-PCR (RSV, Flu A&B, Covid) Anterior Nasal Swab     Status: None   Collection Time: 09/11/23 10:44 AM   Specimen: Anterior Nasal Swab  Result Value Ref Range Status   SARS Coronavirus 2 by RT PCR NEGATIVE NEGATIVE Final   Influenza A by PCR NEGATIVE NEGATIVE Final   Influenza B by PCR NEGATIVE NEGATIVE Final    Comment: (NOTE) The Xpert Xpress SARS-CoV-2/FLU/RSV plus assay is intended as an aid in the diagnosis of influenza from Nasopharyngeal swab specimens and should not be used as a sole basis for treatment. Nasal washings and aspirates are unacceptable for Xpert Xpress SARS-CoV-2/FLU/RSV testing.  Fact Sheet for Patients: BloggerCourse.com  Fact Sheet for Healthcare  Providers: SeriousBroker.it  This test is not yet approved or cleared by the [place] FDA and has been authorized for detection and/or diagnosis of SARS-CoV-2 by FDA under an Emergency Use Authorization (EUA). This EUA will remain in effect (meaning this test can be used) for the duration of the COVID-19 declaration under Section 564(b)(1) of the Act, 21 U.S.C. section 360bbb-3(b)(1), unless the authorization is terminated or revoked.     Resp Syncytial Virus by PCR NEGATIVE NEGATIVE Final    Comment: (NOTE) Fact Sheet for Patients: BloggerCourse.com  Fact Sheet for Healthcare Providers: SeriousBroker.it  This test is not yet approved or cleared by the [place] FDA and has been authorized for detection and/or diagnosis of SARS-CoV-2 by FDA under an Emergency Use Authorization (EUA). This EUA will remain in effect (meaning this test can be used) for the duration of the COVID-19 declaration under Section 564(b)(1) of the Act, 21 U.S.C. section 360bbb-3(b)(1), unless the authorization is terminated or revoked.  Performed at [facility], [address], [place], [place] [address]   Respiratory (~20 pathogens) panel by PCR     Status: None   Collection Time: 09/11/23 10:44 AM   Specimen: Nasopharyngeal Swab; Respiratory  Result Value Ref Range Status   Adenovirus NOT DETECTED NOT DETECTED Final   Coronavirus 229E NOT DETECTED NOT DETECTED Final    Comment: (NOTE) The Coronavirus on the Respiratory Panel, DOES NOT test for the novel  Coronavirus (  2019 nCoV)    Coronavirus HKU1 NOT DETECTED NOT DETECTED Final   Coronavirus NL63 NOT DETECTED NOT DETECTED Final   Coronavirus OC43 NOT DETECTED NOT DETECTED Final   Metapneumovirus NOT DETECTED NOT DETECTED Final   Rhinovirus / Enterovirus NOT DETECTED NOT DETECTED Final   Influenza A NOT DETECTED NOT DETECTED Final   Influenza B NOT DETECTED  NOT DETECTED Final   Parainfluenza Virus 1 NOT DETECTED NOT DETECTED Final   Parainfluenza Virus 2 NOT DETECTED NOT DETECTED Final   Parainfluenza Virus 3 NOT DETECTED NOT DETECTED Final   Parainfluenza Virus 4 NOT DETECTED NOT DETECTED Final   Respiratory Syncytial Virus NOT DETECTED NOT DETECTED Final   Bordetella pertussis NOT DETECTED NOT DETECTED Final   Bordetella Parapertussis NOT DETECTED NOT DETECTED Final   Chlamydophila pneumoniae NOT DETECTED NOT DETECTED Final   Mycoplasma pneumoniae NOT DETECTED NOT DETECTED Final    Comment: Performed at [facility], [address] [place], [place] [address]  Culture, blood (routine x 2)     Status: None (Preliminary result)   Collection Time: 09/11/23 11:58 AM   Specimen: BLOOD LEFT HAND  Result Value Ref Range Status   Specimen Description BLOOD LEFT HAND  Final   Special Requests   Final    BOTTLES DRAWN AEROBIC AND ANAEROBIC Blood Culture results may not be optimal due to an inadequate volume of blood received in culture bottles   Culture   Final    NO GROWTH 3 DAYS Performed at [facility], [address] [place], [place] [address]    Report Status PENDING  Incomplete  Culture, blood (routine x 2)     Status: Abnormal   Collection Time: 09/11/23 12:03 PM   Specimen: BLOOD RIGHT WRIST  Result Value Ref Range Status   Specimen Description BLOOD RIGHT WRIST  Final   Special Requests   Final    BOTTLES DRAWN AEROBIC AND ANAEROBIC Blood Culture adequate volume   Culture  Setup Time   Final    GRAM POSITIVE COCCI IN CHAINS ANAEROBIC BOTTLE ONLY CRITICAL RESULT CALLED TO, READ BACK BY AND VERIFIED WITH: Duwaine Maxin [id number] @ 1638 FH Performed at [facility], [address], [place], [place] [address]    Culture ENTEROCOCCUS FAECALIS (A)  Final   Report Status 09/15/2023 FINAL  Final   Organism ID, Bacteria ENTEROCOCCUS FAECALIS  Final      Susceptibility   Enterococcus faecalis - MIC*    AMPICILLIN <=2  SENSITIVE Sensitive     VANCOMYCIN 1 SENSITIVE Sensitive     GENTAMICIN SYNERGY SENSITIVE Sensitive     * ENTEROCOCCUS FAECALIS  Blood Culture ID Panel (Reflexed)     Status: Abnormal   Collection Time: 09/11/23 12:03 PM  Result Value Ref Range Status   Enterococcus faecalis DETECTED (A) NOT DETECTED Final    Comment: PHARMD E. PAYTES [id number] @ 1638 FH   Enterococcus Faecium NOT DETECTED NOT DETECTED Final   Listeria monocytogenes NOT DETECTED NOT DETECTED Final   Staphylococcus species NOT DETECTED NOT DETECTED Final   Staphylococcus aureus (BCID) NOT DETECTED NOT DETECTED Final   Staphylococcus epidermidis NOT DETECTED NOT DETECTED Final   Staphylococcus lugdunensis NOT DETECTED NOT DETECTED Final   Streptococcus species NOT DETECTED NOT DETECTED Final   Streptococcus agalactiae NOT DETECTED NOT DETECTED Final   Streptococcus pneumoniae NOT DETECTED NOT DETECTED Final   Streptococcus pyogenes NOT DETECTED NOT DETECTED Final   A.calcoaceticus-baumannii NOT DETECTED NOT DETECTED  Final   Bacteroides fragilis NOT DETECTED NOT DETECTED Final   Enterobacterales NOT DETECTED NOT DETECTED Final   Enterobacter cloacae complex NOT DETECTED NOT DETECTED Final   Escherichia coli NOT DETECTED NOT DETECTED Final   Klebsiella aerogenes NOT DETECTED NOT DETECTED Final   Klebsiella oxytoca NOT DETECTED NOT DETECTED Final   Klebsiella pneumoniae NOT DETECTED NOT DETECTED Final   Proteus species NOT DETECTED NOT DETECTED Final   Salmonella species NOT DETECTED NOT DETECTED Final   Serratia marcescens NOT DETECTED NOT DETECTED Final   Haemophilus influenzae NOT DETECTED NOT DETECTED Final   Neisseria meningitidis NOT DETECTED NOT DETECTED Final   Pseudomonas aeruginosa NOT DETECTED NOT DETECTED Final   Stenotrophomonas maltophilia NOT DETECTED NOT DETECTED Final   Candida albicans NOT DETECTED NOT DETECTED Final   Candida auris NOT DETECTED NOT DETECTED Final   Candida glabrata NOT DETECTED NOT  DETECTED Final   Candida krusei NOT DETECTED NOT DETECTED Final   Candida parapsilosis NOT DETECTED NOT DETECTED Final   Candida tropicalis NOT DETECTED NOT DETECTED Final   Cryptococcus neoformans/gattii NOT DETECTED NOT DETECTED Final   Vancomycin resistance NOT DETECTED NOT DETECTED Final    Comment: Performed at [facility], [address], [place], [place] [address]    Impression/Plan:  1.  Enterococcal faecalis bacteremia.  No new positive cultures and he is feeling well with no fever.  No signs of ongoing infection.  TTE without any concerns. At this point I would have him continue with antibiotics for a 2-week period and can take amoxicillin 1000 mg 3 times a day to complete the 2 weeks through February 3 and then stop.  Will have him follow-up with [place] after that and can recheck blood cultures to assure clearance.  2.  Cirrhosis of the liver.  Alcoholic cirrhosis in origin.  Followed by family medicine.  He will need HCC screening and potentially need EGD as an outpatient if not already followed by GI.  3.  Thrombocytopenia.  Likely secondary to #2 with cirrhosis.  I will sign off, call with any questions.

## 2023-09-15 NOTE — Progress Notes (Signed)
PROGRESS NOTE    Juan [name]  TKZ:[id number] DOB: 08/19/1966 DOA: 09/11/2023 PCP: Everrett Coombe, DO   Brief Narrative:  58 y.o. male with medical history significant of hypertension, GERD, esophagitis, anemia, alcohol use, alcoholic myopathy, alcoholic cirrhosis, anxiety, bipolar, PTSD, depression, GI bleed, QTc prolongation presented with fever for 3 days.  On presentation, RSV/COVID/influenza PCR was negative; total bili was 4.6, platelets of 49, lactic acid of 2.6.  Chest x-ray showed mild density at the left base; UA was unremarkable.  Patient received IV antibiotics in the ED. He was found to have Enterococcus faecalis bacteremia: ID was auto consulted, antibiotics switched to IV ampicillin.  Assessment & Plan:   Febrile illness/presumed community-acquired pneumonia Enterococcus faecalis bacteremia -Presented with fever and chest x-ray showed possible left infiltrate with mildly elevated procalcitonin.  RSV/COVID/influenza PCR negative. -Started on IV antibiotics which has been switched to IV ampicillin as per ID recommendations.  ID following.  2D echo did not show any vegetations. -No temperature spikes over the last 48 hours  AKI -Due to poor oral intake.  Resolved with IV fluids.  Acute metabolic acidosis -Bicarb slightly improving to 19 today.  Continue sodium bicarbonate tablets.  Monitor  Hypokalemia -Improved  Hyponatremia -Sodium improving to 131 today.  Of IV fluids.  Currently on fluid restriction.  Monitor.  Hypomagnesemia -Resolved  Anemia of chronic disease -From chronic illnesses.  Monitor.  No signs of bleeding  Thrombocytopenia -Possibly from cirrhosis of liver.  Platelets stable.  Monitor.  No signs of bleeding  Hypoalbuminemia -Possible from cirrhosis of liver.  Follow nutrition recommendations  Hypocalcemia -Corrected calcium still low: Will give IV calcium gluconate  Alcoholic cirrhosis of liver Elevated LFTs -Reportedly has been abstaining  from alcohol. 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  Outpatient follow-up with GI. -Currently not on diuretics.  Continue lactulose  Hypertension -Currently not on antihypertensives.  Monitor blood pressure  GERD Esophagitis -Continue PPI  Anxiety/depression/bipolar disorder/PTSD -Continue home Seroquel.  BuSpar and Trintellix on hold for now.  Outpatient follow-up with psychiatry  QTc prolongation -Avoid QTc prolonging medications.  Continue monitoring on telemetry  Physical deconditioning -PT eval  DVT prophylaxis: SCDs Code Status: Full Family Communication: None at bedside Disposition Plan: Status is: Inpatient Remains inpatient appropriate because: Of severity of illnes   Consultants: ID  Procedures: Echo Antimicrobials:  Anti-infectives (From admission, onward)    Start     Dose/Rate Route Frequency Ordered Stop   09/14/23 0800  ampicillin (OMNIPEN) 2 g in sodium chloride 0.9 % 100 mL IVPB        2 g 300 mL/hr over 20 Minutes Intravenous Every 4 hours 09/14/23 0741     09/13/23 1800  ampicillin (OMNIPEN) 2 g in sodium chloride 0.9 % 100 mL IVPB  Status:  Discontinued        2 g 300 mL/hr over 20 Minutes Intravenous Every 6 hours 09/13/23 1705 09/14/23 0741   09/12/23 1000  cefTRIAXone (ROCEPHIN) 1 g in sodium chloride 0.9 % 100 mL IVPB  Status:  Discontinued        1 g 200 mL/hr over 30 Minutes Intravenous Every 24 hours 09/11/23 1653 09/13/23 1705   09/11/23 1145  cefTRIAXone (ROCEPHIN) 1 g in sodium chloride 0.9 % 100 mL IVPB        1 g 200 mL/hr over 30 Minutes Intravenous  Once 09/11/23 1141 09/11/23 1305   09/11/23 1145  doxycycline (VIBRA-TABS) tablet 100 mg  100 mg Oral  Once 09/11/23 1141 09/11/23 1158        Subjective: Patient seen and examined at bedside.  Continues to feel weak but feels slightly better.  Denies any fever, chest pain, worsening shortness of breath or abdominal  pain. Objective: Vitals:   09/14/23 0653 09/14/23 0814 09/14/23 2205 09/15/23 0624  BP: 103/60 98/65 (!) 110/59 (!) 97/52  Pulse: 88 78 87 82  Resp: 18  18 18   Temp: 98.1 F (36.7 C)  98.3 F (36.8 C) 98.4 F (36.9 C)  TempSrc:      SpO2: 97% 96% 97% 95%  Weight:      Height:        Intake/Output Summary (Last 24 hours) at 09/15/2023 0716 Last data filed at 09/14/2023 2230 Gross per 24 hour  Intake 660 ml  Output 1400 ml  Net -740 ml   Filed Weights   09/11/23 1031  Weight: 79.8 kg    Examination:  General: Currently on room air.  No distress. ENT/neck: No thyromegaly.  JVD is not elevated  respiratory: Decreased breath sounds at bases bilaterally with some crackles; no wheezing  CVS: S1-S2 heard, rate controlled currently Abdominal: Soft, nontender, slightly distended; no organomegaly, bowel sounds are heard Extremities: Trace lower extremity edema; no cyanosis  CNS: Awake and alert.  No focal neurologic deficit.  Moves extremities Lymph: No obvious lymphadenopathy Skin: No obvious ecchymosis/lesions  psych: Mostly flat affect.  Not agitated currently.  Musculoskeletal: No obvious joint swelling/deformity     Data Reviewed: I have personally reviewed following labs and imaging studies  CBC: Recent Labs  Lab 09/11/23 1040 09/11/23 1346 09/12/23 0117 09/13/23 0837 09/14/23 0421 09/15/23 0406  WBC 7.6  --  6.7 6.9 7.4 7.7  NEUTROABS 5.2  --   --  4.6 4.9 5.0  HGB 9.3* 9.5* 8.0* 8.7* 8.5* 8.0*  HCT 28.0* 28.0* 24.4* 26.1* 25.7* 24.1*  MCV 79.8*  --  80.0 79.1* 78.4* 79.0*  PLT 49*  --  40* 40* 38* 39*   Basic Metabolic Panel: Recent Labs  Lab 09/11/23 1040 09/11/23 1229 09/11/23 1346 09/12/23 0116 09/12/23 0117 09/13/23 0837 09/13/23 1607 09/14/23 0421 09/15/23 0406  NA 131*  --  133*  --  130* 126*  --  123* 131*  K 2.7*  --  3.7  --  3.3* 3.9  --  3.4* 3.5  CL 101  --   --   --  104 104  --  100 108  CO2 15*  --   --   --  20* 17*  --  17*  19*  GLUCOSE 124*  --   --   --  125* 105*  --  116* 95  BUN 13  --   --   --  12 11  --  8 10  CREATININE 1.26*  --   --   --  1.13 1.22  --  1.20 1.19  CALCIUM 7.9*  --   --   --  7.4* 7.1*  --  6.4* 6.4*  MG  --    < >  --  1.6*  --  1.4* 2.9* 2.0 1.7   < > = values in this interval not displayed.   GFR: Estimated Creatinine Clearance: 68.3 mL/min (by C-G formula based on SCr of 1.19 mg/dL). Liver Function Tests: Recent Labs  Lab 09/11/23 1040 09/12/23 0117 09/13/23 0837 09/14/23 0421 09/15/23 0406  AST 60* 48* 62* 86* 68*  ALT 25 20  25 31 28   ALKPHOS 80 64 70 68 67  BILITOT 4.6* 2.6* 3.2* 2.5* 2.5*  PROT 6.0* 5.2* 5.5* 5.1* 4.7*  ALBUMIN 2.4* 2.0* 2.1* 1.9* 1.7*   Recent Labs  Lab 09/11/23 1040  LIPASE 60*   Recent Labs  Lab 09/11/23 1100  AMMONIA 35   Coagulation Profile: Recent Labs  Lab 09/11/23 1100 09/12/23 0117  INR 2.1* 2.4*   Cardiac Enzymes: No results for input(s): "CKTOTAL", "CKMB", "CKMBINDEX", "TROPONINI" in the last 168 hours. BNP (last 3 results) No results for input(s): "PROBNP" in the last 8760 hours. HbA1C: No results for input(s): "HGBA1C" in the last 72 hours. CBG: Recent Labs  Lab 09/13/23 0606  GLUCAP 92   Lipid Profile: No results for input(s): "CHOL", "HDL", "LDLCALC", "TRIG", "CHOLHDL", "LDLDIRECT" in the last 72 hours. Thyroid Function Tests: No results for input(s): "TSH", "T4TOTAL", "FREET4", "T3FREE", "THYROIDAB" in the last 72 hours. Anemia Panel: No results for input(s): "VITAMINB12", "FOLATE", "FERRITIN", "TIBC", "IRON", "RETICCTPCT" in the last 72 hours. Sepsis Labs: Recent Labs  Lab 09/11/23 1235 09/11/23 1334 09/12/23 0117 09/13/23 0837  PROCALCITON  --  0.34 0.45 0.38  LATICACIDVEN 2.6*  --   --   --     Recent Results (from the past 240 hours)  Resp panel by RT-PCR (RSV, Flu A&B, Covid) Anterior Nasal Swab     Status: None   Collection Time: 09/11/23 10:44 AM   Specimen: Anterior Nasal Swab  Result  Value Ref Range Status   SARS Coronavirus 2 by RT PCR NEGATIVE NEGATIVE Final   Influenza A by PCR NEGATIVE NEGATIVE Final   Influenza B by PCR NEGATIVE NEGATIVE Final    Comment: (NOTE) The Xpert Xpress SARS-CoV-2/FLU/RSV plus assay is intended as an aid in the diagnosis of influenza from Nasopharyngeal swab specimens and should not be used as a sole basis for treatment. Nasal washings and aspirates are unacceptable for Xpert Xpress SARS-CoV-2/FLU/RSV testing.  Fact Sheet for Patients: BloggerCourse.com  Fact Sheet for Healthcare Providers: SeriousBroker.it  This test is not yet approved or cleared by the [place] FDA and has been authorized for detection and/or diagnosis of SARS-CoV-2 by FDA under an Emergency Use Authorization (EUA). This EUA will remain in effect (meaning this test can be used) for the duration of the COVID-19 declaration under Section 564(b)(1) of the Act, 21 U.S.C. section 360bbb-3(b)(1), unless the authorization is terminated or revoked.     Resp Syncytial Virus by PCR NEGATIVE NEGATIVE Final    Comment: (NOTE) Fact Sheet for Patients: BloggerCourse.com  Fact Sheet for Healthcare Providers: SeriousBroker.it  This test is not yet approved or cleared by the [place] FDA and has been authorized for detection and/or diagnosis of SARS-CoV-2 by FDA under an Emergency Use Authorization (EUA). This EUA will remain in effect (meaning this test can be used) for the duration of the COVID-19 declaration under Section 564(b)(1) of the Act, 21 U.S.C. section 360bbb-3(b)(1), unless the authorization is terminated or revoked.  Performed at [facility], [address] [place], [place] [address]   Respiratory (~20 pathogens) panel by PCR     Status: None   Collection Time: 09/11/23 10:44 AM   Specimen: Nasopharyngeal Swab; Respiratory  Result Value  Ref Range Status   Adenovirus NOT DETECTED NOT DETECTED Final   Coronavirus 229E NOT DETECTED NOT DETECTED Final    Comment: (NOTE) The Coronavirus on the Respiratory Panel, DOES NOT test for the novel  Coronavirus (2019 nCoV)    Coronavirus  HKU1 NOT DETECTED NOT DETECTED Final   Coronavirus NL63 NOT DETECTED NOT DETECTED Final   Coronavirus OC43 NOT DETECTED NOT DETECTED Final   Metapneumovirus NOT DETECTED NOT DETECTED Final   Rhinovirus / Enterovirus NOT DETECTED NOT DETECTED Final   Influenza A NOT DETECTED NOT DETECTED Final   Influenza B NOT DETECTED NOT DETECTED Final   Parainfluenza Virus 1 NOT DETECTED NOT DETECTED Final   Parainfluenza Virus 2 NOT DETECTED NOT DETECTED Final   Parainfluenza Virus 3 NOT DETECTED NOT DETECTED Final   Parainfluenza Virus 4 NOT DETECTED NOT DETECTED Final   Respiratory Syncytial Virus NOT DETECTED NOT DETECTED Final   Bordetella pertussis NOT DETECTED NOT DETECTED Final   Bordetella Parapertussis NOT DETECTED NOT DETECTED Final   Chlamydophila pneumoniae NOT DETECTED NOT DETECTED Final   Mycoplasma pneumoniae NOT DETECTED NOT DETECTED Final    Comment: Performed at [facility], [address], [place], [place] [address]  Culture, blood (routine x 2)     Status: None (Preliminary result)   Collection Time: 09/11/23 11:58 AM   Specimen: BLOOD LEFT HAND  Result Value Ref Range Status   Specimen Description BLOOD LEFT HAND  Final   Special Requests   Final    BOTTLES DRAWN AEROBIC AND ANAEROBIC Blood Culture results may not be optimal due to an inadequate volume of blood received in culture bottles   Culture   Final    NO GROWTH 3 DAYS Performed at [facility], [address], [place], [place] [address]    Report Status PENDING  Incomplete  Culture, blood (routine x 2)     Status: Abnormal (Preliminary result)   Collection Time: 09/11/23 12:03 PM   Specimen: BLOOD RIGHT WRIST  Result Value Ref Range Status   Specimen  Description BLOOD RIGHT WRIST  Final   Special Requests   Final    BOTTLES DRAWN AEROBIC AND ANAEROBIC Blood Culture adequate volume   Culture  Setup Time   Final    GRAM POSITIVE COCCI IN CHAINS ANAEROBIC BOTTLE ONLY CRITICAL RESULT CALLED TO, READ BACK BY AND VERIFIED WITH: PHARMD E. PAYTES [id number] @ 1638 FH    Culture (A)  Final    ENTEROCOCCUS FAECALIS SUSCEPTIBILITIES TO FOLLOW Performed at [facility], [address], [place], [place] [address]    Report Status PENDING  Incomplete  Blood Culture ID Panel (Reflexed)     Status: Abnormal   Collection Time: 09/11/23 12:03 PM  Result Value Ref Range Status   Enterococcus faecalis DETECTED (A) NOT DETECTED Final    Comment: PHARMD E. PAYTES [id number] @ 1638 FH   Enterococcus Faecium NOT DETECTED NOT DETECTED Final   Listeria monocytogenes NOT DETECTED NOT DETECTED Final   Staphylococcus species NOT DETECTED NOT DETECTED Final   Staphylococcus aureus (BCID) NOT DETECTED NOT DETECTED Final   Staphylococcus epidermidis NOT DETECTED NOT DETECTED Final   Staphylococcus lugdunensis NOT DETECTED NOT DETECTED Final   Streptococcus species NOT DETECTED NOT DETECTED Final   Streptococcus agalactiae NOT DETECTED NOT DETECTED Final   Streptococcus pneumoniae NOT DETECTED NOT DETECTED Final   Streptococcus pyogenes NOT DETECTED NOT DETECTED Final   A.calcoaceticus-baumannii NOT DETECTED NOT DETECTED Final   Bacteroides fragilis NOT DETECTED NOT DETECTED Final   Enterobacterales NOT DETECTED NOT DETECTED Final   Enterobacter cloacae complex NOT DETECTED NOT DETECTED Final   Escherichia coli NOT DETECTED NOT DETECTED Final   Klebsiella aerogenes NOT DETECTED NOT DETECTED Final   Klebsiella oxytoca  NOT DETECTED NOT DETECTED Final   Klebsiella pneumoniae NOT DETECTED NOT DETECTED Final   Proteus species NOT DETECTED NOT DETECTED Final   Salmonella species NOT DETECTED NOT DETECTED Final   Serratia marcescens NOT DETECTED NOT DETECTED Final    Haemophilus influenzae NOT DETECTED NOT DETECTED Final   Neisseria meningitidis NOT DETECTED NOT DETECTED Final   Pseudomonas aeruginosa NOT DETECTED NOT DETECTED Final   Stenotrophomonas maltophilia NOT DETECTED NOT DETECTED Final   Candida albicans NOT DETECTED NOT DETECTED Final   Candida auris NOT DETECTED NOT DETECTED Final   Candida glabrata NOT DETECTED NOT DETECTED Final   Candida krusei NOT DETECTED NOT DETECTED Final   Candida parapsilosis NOT DETECTED NOT DETECTED Final   Candida tropicalis NOT DETECTED NOT DETECTED Final   Cryptococcus neoformans/gattii NOT DETECTED NOT DETECTED Final   Vancomycin resistance NOT DETECTED NOT DETECTED Final    Comment: Performed at [facility], [address] [place], [place] [address]         Radiology Studies: ECHOCARDIOGRAM COMPLETE Result Date: 09/14/2023    ECHOCARDIOGRAM REPORT   Patient Name:   WRENLEY WEATHERMAN Date of Exam: 09/14/2023 Medical Rec #:  [id number]       Height:       65.0 in Accession #:    [id number]      Weight:       176.0 lb Date of Birth:  [birth year]       BSA:          1.874 m Patient Age:    57 years        BP:           98/65 mmHg Patient Gender: M               HR:           78 bpm. Exam Location:  Inpatient Procedure: 2D Echo, Color Doppler and Cardiac Doppler Indications:    Bacteremia R78.81  History:        Patient has prior history of Echocardiogram examinations, most                 recent 05/05/2022.  Sonographer:    Harriette Bouillon RDCS Referring Phys: [phone] STEPHANIE N DIXON IMPRESSIONS  1. Left ventricular ejection fraction, by estimation, is 60 to 65%. The left ventricle has normal function. The left ventricle has no regional wall motion abnormalities. Left ventricular diastolic parameters were normal.  2. Right ventricular systolic function is normal. The right ventricular size is normal. Tricuspid regurgitation signal is inadequate for assessing PA pressure.  3. The mitral valve is normal in structure.  Trivial mitral valve regurgitation. No evidence of mitral stenosis.  4. The aortic valve was not well visualized. There is moderate calcification of the aortic valve. Aortic valve regurgitation is not visualized. Moderate aortic valve stenosis. Vmax 2.8 m/s, MG , AVA 1.4 cm^2, DI 0.43  5. The inferior vena cava is normal in size with greater than 50% respiratory variability, suggesting right atrial pressure of 3 mmHg. Conclusion(s)/Recommendation(s): No evidence of valvular vegetations on this transthoracic echocardiogram. Consider a transesophageal echocardiogram to exclude infective endocarditis if clinically indicated. FINDINGS  Left Ventricle: Left ventricular ejection fraction, by estimation, is 60 to 65%. The left ventricle has normal function. The left ventricle has no regional wall motion abnormalities. The left ventricular internal cavity size was normal in size. There is  no left ventricular hypertrophy. Left ventricular diastolic parameters were normal. Right Ventricle: The right ventricular  size is normal. Right vetricular wall thickness was not well visualized. Right ventricular systolic function is normal. Tricuspid regurgitation signal is inadequate for assessing PA pressure. Left Atrium: Left atrial size was normal in size. Right Atrium: Right atrial size was normal in size. Pericardium: There is no evidence of pericardial effusion. Mitral Valve: The mitral valve is normal in structure. Trivial mitral valve regurgitation. No evidence of mitral valve stenosis. Tricuspid Valve: The tricuspid valve is normal in structure. Tricuspid valve regurgitation is trivial. Aortic Valve: The aortic valve was not well visualized. There is moderate calcification of the aortic valve. Aortic valve regurgitation is not visualized. Moderate aortic stenosis is present. Aortic valve mean gradient measures 18.0 mmHg. Aortic valve peak gradient measures 31.4 mmHg. Aortic valve area, by VTI measures 1.37 cm. Pulmonic  Valve: The pulmonic valve was not well visualized. Pulmonic valve regurgitation is not visualized. Aorta: The aortic root is normal in size and structure. Venous: The inferior vena cava is normal in size with greater than 50% respiratory variability, suggesting right atrial pressure of 3 mmHg. IAS/Shunts: The interatrial septum was not well visualized.  LEFT VENTRICLE PLAX 2D LVIDd:         5.10 cm   Diastology LVIDs:         2.90 cm   LV e' medial:    9.25 cm/s LV PW:         0.80 cm   LV E/e' medial:  10.6 LV IVS:        0.90 cm   LV e' lateral:   17.10 cm/s LVOT diam:     2.00 cm   LV E/e' lateral: 5.8 LV SV:         72 LV SV Index:   38 LVOT Area:     3.14 cm  RIGHT VENTRICLE         IVC TAPSE (M-mode): 1.6 cm  IVC diam: 1.50 cm LEFT ATRIUM           Index LA diam:      3.50 cm 1.87 cm/m LA Vol (A4C): 30.1 ml 16.07 ml/m  AORTIC VALVE AV Area (Vmax):    1.47 cm AV Area (Vmean):   1.46 cm AV Area (VTI):     1.37 cm AV Vmax:           280.00 cm/s AV Vmean:          193.000 cm/s AV VTI:            0.526 m AV Peak Grad:      31.4 mmHg AV Mean Grad:      18.0 mmHg LVOT Vmax:         131.00 cm/s LVOT Vmean:        89.600 cm/s LVOT VTI:          0.229 m LVOT/AV VTI ratio: 0.44  AORTA Ao Root diam: 3.20 cm MITRAL VALVE MV Area (PHT): 3.95 cm    SHUNTS MV Decel Time: 192 msec    Systemic VTI:  0.23 m MV E velocity: 98.50 cm/s  Systemic Diam: 2.00 cm MV A velocity: 67.60 cm/s MV E/A ratio:  1.46 Epifanio Lesches MD Electronically signed by Epifanio Lesches MD Signature Date/Time: 09/14/2023/12:04:22 PM    Final         Scheduled Meds:  pantoprazole  40 mg Oral Daily   QUEtiapine  25-50 mg Oral QHS   saccharomyces boulardii  250 mg Oral BID   sodium bicarbonate  650 mg Oral  TID   sodium chloride flush  3 mL Intravenous Q12H   Continuous Infusions:  ampicillin (OMNIPEN) IV Stopped (09/15/23 0701)   calcium gluconate            Glade Lloyd, MD [facility] 09/15/2023, 7:16 AM

## 2023-09-15 NOTE — TOC Initial Note (Signed)
Transition of Care (TOC) - Initial/Assessment Note  Donn Pierini RN, BSN Transitions of Care Unit 4E- RN Case Manager See Treatment Team for direct phone # Cross coverage [phone]  Patient Details  Name: Juan [name] MRN: [id number] Date of Birth: Sep 25, 1965  Transition of Care [facility]) CM/SW Contact:    Darrold Span, RN Phone Number: 09/15/2023, 3:10 PM  Clinical Narrative:                 EDD for tomorrow, per ID plan to go home on oral abx. New HH orders placed. Pt is active with [facility] for [facility] PT needs- confirmed with liaison - [facility] will continue to follow for PT/OT at time of discharge.   Expected Discharge Plan: Home w Home Health Services Barriers to Discharge: Continued Medical Work up   Patient Goals and CMS Choice Patient states their goals for this hospitalization and ongoing recovery are:: return home          Expected Discharge Plan and Services   Discharge Planning Services: CM Consult Post Acute Care Choice: Home Health, Resumption of Svcs/PTA Provider Living arrangements for the past 2 months: Single Family Home                           HH Arranged: PT, OT HH Agency: [facility] Date HH Agency Contacted: 09/15/23 Time HH Agency Contacted: [phone] Representative spoke with at [facility] Agency: Adele Dan  Prior Living Arrangements/Services Living arrangements for the past 2 months: Single Family Home Lives with:: Spouse Patient language and need for interpreter reviewed:: Yes Do you feel safe going back to the place where you live?: Yes      Need for Family Participation in Patient Care: Yes (Comment) Care giver support system in place?: Yes (comment)   Criminal Activity/Legal Involvement Pertinent to Current Situation/Hospitalization: No - Comment as needed  Activities of Daily Living   ADL Screening (condition at time of admission) Independently performs ADLs?: Yes (appropriate for developmental age) Is the patient deaf  or have difficulty hearing?: No Does the patient have difficulty seeing, even when wearing glasses/contacts?: Yes Does the patient have difficulty concentrating, remembering, or making decisions?: No  Permission Sought/Granted                  Emotional Assessment         Alcohol / Substance Use: Not Applicable Psych Involvement: No (comment)  Admission diagnosis:  Hypokalemia [E87.6] Weakness [R53.1] AKI (acute kidney injury) (HCC) [N17.9] Pneumonia of left lower lobe due to infectious organism [J18.9] Patient Active Problem List   Diagnosis Date Noted   AKI (acute kidney injury) (HCC) 09/11/2023   Febrile illness 06/27/2023   Bipolar 2 disorder, major depressive episode (HCC) 05/23/2023   Alcohol use disorder 05/23/2023   Arthritis 05/23/2023   Pain of left calf 04/11/2023   Chest trauma 03/18/2023   Acute pain of right knee 03/10/2023   Eustachian tube dysfunction 03/02/2023   Breast nodule 01/12/2023   Post-traumatic osteoarthritis of right knee 01/04/2023   Acute bacterial sinusitis 11/15/2022   Rib pain on right side 10/31/2022   Head injury 10/31/2022   Bilateral lower extremity edema 10/12/2022   Epistaxis 09/14/2022   Anasarca 09/14/2022   Hematemesis 09/13/2022   Lower extremity edema 09/06/2022   Hyperbilirubinemia 08/17/2022   Anxiety 08/17/2022   GI bleed 08/16/2022   Lumbar degenerative disc disease 07/26/2022   Prolonged QT interval 05/05/2022   Anemia of chronic  disease 05/05/2022   Hyponatremia 03/02/2022   GERD (gastroesophageal reflux disease) 03/02/2022   Normocytic anemia 03/02/2022   Tobacco chew use 03/02/2022   Situational anxiety 02/25/2022   Alcoholic cirrhosis of liver with ascites (HCC) 02/17/2022   DDD (degenerative disc disease), cervical 01/22/2022   Hypokalemia 01/11/2022   Hypomagnesemia 01/11/2022   Malnutrition of moderate degree 01/11/2022   Nonrheumatic aortic valve stenosis    Shoulder pain, left, posterior 12/22/2021    Portal hypertensive gastropathy (HCC)    Pancytopenia (HCC) 09/18/2021   Tinea pedis 05/12/2021   Diverticulosis 04/20/2021   Right lower quadrant pain 04/17/2021   Tibialis posterior tendinitis, right 04/14/2021   Dizziness 03/23/2021   Urinary frequency 03/23/2021   Right ankle sprain 02/12/2021   Well adult exam 01/06/2021   Systolic murmur 01/06/2021   Thrombocytopenia (HCC) 11/02/2020   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 07/11/2020   Allergic reaction 07/08/2020   History of alcohol abuse 05/20/2020   Impingement syndrome, shoulder, right 05/20/2020   Fracture of laryngeal cartilage (HCC) 01/17/2020   Esophagitis, [place] grade D 12/05/2019   PTSD (post-traumatic stress disorder) 04/16/2019   MDD (major depressive disorder), recurrent severe, 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   History of bilateral inguinal hernia repair 01/19/2019   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 [place] [address] Phone: [phone] Fax: [phone]     Social Drivers of Health (SDOH) Social History: SDOH Screenings   Food Insecurity: No Food Insecurity (09/11/2023)  Housing: Low Risk  (09/11/2023)  Transportation Needs: No Transportation Needs (09/11/2023)  Utilities: Not At Risk (09/11/2023)  Alcohol Screen: Low Risk  (05/22/2023)  Depression (PHQ2-9): Medium Risk (05/12/2023)  Financial Resource Strain: Medium Risk (01/09/2023)  Physical Activity: Insufficiently Active (01/09/2023)  Social Connections: Patient Declined (09/11/2023)  Stress: Stress Concern Present  (01/09/2023)  Tobacco Use: High Risk (09/11/2023)   SDOH Interventions:     Readmission Risk Interventions    09/15/2022    9:29 AM 03/04/2022    8:29 AM  Readmission Risk Prevention Plan  Transportation Screening Complete Complete  PCP or Specialist Appt within 5-7 Days  Complete  PCP or Specialist Appt within 3-5 Days Complete   Home Care Screening  Complete  Medication Review (RN CM)  Complete  HRI or Home Care Consult Complete   Social Work Consult for Recovery Care Planning/Counseling Complete   Palliative Care Screening Not Applicable   Medication Review Oceanographer) Complete

## 2023-09-16 ENCOUNTER — Other Ambulatory Visit (HOSPITAL_COMMUNITY): Payer: Self-pay

## 2023-09-16 ENCOUNTER — Ambulatory Visit (HOSPITAL_COMMUNITY): Payer: Commercial Managed Care - PPO | Admitting: Psychiatry

## 2023-09-16 DIAGNOSIS — N179 Acute kidney failure, unspecified: Secondary | ICD-10-CM | POA: Diagnosis not present

## 2023-09-16 LAB — COMPREHENSIVE METABOLIC PANEL
ALT: 29 U/L (ref 0–44)
AST: 63 U/L — ABNORMAL HIGH (ref 15–41)
Albumin: 1.9 g/dL — ABNORMAL LOW (ref 3.5–5.0)
Alkaline Phosphatase: 83 U/L (ref 38–126)
Anion gap: 7 (ref 5–15)
BUN: 9 mg/dL (ref 6–20)
CO2: 19 mmol/L — ABNORMAL LOW (ref 22–32)
Calcium: 6.5 mg/dL — ABNORMAL LOW (ref 8.9–10.3)
Chloride: 108 mmol/L (ref 98–111)
Creatinine, Ser: 1.16 mg/dL (ref 0.61–1.24)
GFR, Estimated: >60 mL/min (ref >60)
Glucose, Bld: 100 mg/dL — ABNORMAL HIGH (ref 70–99)
Potassium: 3.5 mmol/L (ref 3.5–5.1)
Sodium: 134 mmol/L — ABNORMAL LOW (ref 135–145)
Total Bilirubin: 2.4 mg/dL — ABNORMAL HIGH (ref 0.0–1.2)
Total Protein: 5 g/dL — ABNORMAL LOW (ref 6.5–8.1)

## 2023-09-16 LAB — CULTURE, BLOOD (ROUTINE X 2): Culture: NO GROWTH

## 2023-09-16 LAB — CBC WITH DIFFERENTIAL/PLATELET
Abs Immature Granulocytes: 0.04 10*3/uL (ref 0.00–0.07)
Basophils Absolute: 0 10*3/uL (ref 0.0–0.1)
Basophils Relative: 0 %
Eosinophils Absolute: 0.4 10*3/uL (ref 0.0–0.5)
Eosinophils Relative: 6 %
HCT: 24.5 % — ABNORMAL LOW (ref 39.0–52.0)
Hemoglobin: 8 g/dL — ABNORMAL LOW (ref 13.0–17.0)
Immature Granulocytes: 1 %
Lymphocytes Relative: 22 %
Lymphs Abs: 1.6 10*3/uL (ref 0.7–4.0)
MCH: 25.8 pg — ABNORMAL LOW (ref 26.0–34.0)
MCHC: 32.7 g/dL (ref 30.0–36.0)
MCV: 79 fL — ABNORMAL LOW (ref 80.0–100.0)
Monocytes Absolute: 1.1 10*3/uL — ABNORMAL HIGH (ref 0.1–1.0)
Monocytes Relative: 15 %
Neutro Abs: 4 10*3/uL (ref 1.7–7.7)
Neutrophils Relative %: 56 %
Platelets: 47 10*3/uL — ABNORMAL LOW (ref 150–400)
RBC: 3.1 MIL/uL — ABNORMAL LOW (ref 4.22–5.81)
RDW: 23.6 % — ABNORMAL HIGH (ref 11.5–15.5)
Smear Review: DECREASED
WBC: 7.1 10*3/uL (ref 4.0–10.5)
nRBC: 0 % (ref 0.0–0.2)

## 2023-09-16 LAB — MAGNESIUM: Magnesium: 1.6 mg/dL — ABNORMAL LOW (ref 1.7–2.4)

## 2023-09-16 MED ORDER — SODIUM BICARBONATE 650 MG PO TABS
650.0000 mg | ORAL_TABLET | Freq: Three times a day (TID) | ORAL | 0 refills | Status: DC
  Filled 2023-09-16: qty 90, 30d supply, fill #0

## 2023-09-16 MED ORDER — MAGNESIUM SULFATE 2 GM/50ML IV SOLN
2.0000 g | Freq: Once | INTRAVENOUS | Status: AC
Start: 2023-09-16 — End: 2023-09-16
  Administered 2023-09-16: 2 g via INTRAVENOUS
  Filled 2023-09-16: qty 50

## 2023-09-16 NOTE — TOC Transition Note (Signed)
Transition of Care [facility]) - Discharge Note   Patient Details  Name: Juan [name] MRN: [id number] Date of Birth: 28-Sep-1965  Transition of Care [facility]) CM/SW Contact:  Gordy Clement, RN Phone Number: 09/16/2023, 9:01 AM   Clinical Narrative:     Patient will DC to home today with spouse. [facility] will provide services. No additional TOC needs     Final next level of care: Home w Home Health Services Barriers to Discharge: Continued Medical Work up   Patient Goals and CMS Choice Patient states their goals for this hospitalization and ongoing recovery are:: return home          Discharge Placement                       Discharge Plan and Services Additional resources added to the After Visit Summary for     Discharge Planning Services: CM Consult Post Acute Care Choice: Home Health, Resumption of Svcs/PTA Provider                    HH Arranged: PT, OT [facility] Agency: [facility] Date HH Agency Contacted: 09/15/23 Time HH Agency Contacted: [phone] Representative spoke with at [facility] Agency: Adele Dan  Social Drivers of Health (SDOH) Interventions SDOH Screenings   Food Insecurity: No Food Insecurity (09/11/2023)  Housing: Low Risk  (09/11/2023)  Transportation Needs: No Transportation Needs (09/11/2023)  Utilities: Not At Risk (09/11/2023)  Alcohol Screen: Low Risk  (05/22/2023)  Depression (PHQ2-9): Medium Risk (05/12/2023)  Financial Resource Strain: Medium Risk (01/09/2023)  Physical Activity: Insufficiently Active (01/09/2023)  Social Connections: Patient Declined (09/11/2023)  Stress: Stress Concern Present (01/09/2023)  Tobacco Use: High Risk (09/11/2023)     Readmission Risk Interventions    09/15/2022    9:29 AM 03/04/2022    8:29 AM  Readmission Risk Prevention Plan  Transportation Screening Complete Complete  PCP or Specialist Appt within 5-7 Days  Complete  PCP or Specialist Appt within 3-5 Days Complete   Home Care  Screening  Complete  Medication Review (RN CM)  Complete  HRI or Home Care Consult Complete   Social Work Consult for Recovery Care Planning/Counseling Complete   Palliative Care Screening Not Applicable   Medication Review Oceanographer) Complete

## 2023-09-16 NOTE — Discharge Summary (Signed)
Physician Discharge Summary  Juan [name] UXL:[id number] DOB: 1966-01-23 DOA: 09/11/2023  PCP: Everrett Coombe, DO  Admit date: 09/11/2023 Discharge date: 09/16/2023  Admitted From: Home Disposition: Home  Recommendations for Outpatient Follow-up:  Follow up with PCP in 1 week with repeat CBC/CMP Comply with medications and follow-up as an outpatient follow-up with ID and GI Follow up in ED if symptoms worsen or new appear   Home Health: Home health PT/OT Equipment/Devices: None  Discharge Condition: Stable CODE STATUS: Full Diet recommendation: Heart healthy/fluid restriction of up to 1500 cc a day  Brief/Interim Summary: 58 y.o. male with medical history significant of hypertension, GERD, esophagitis, anemia, alcohol use, alcoholic myopathy, alcoholic cirrhosis, anxiety, bipolar, PTSD, depression, GI bleed, QTc prolongation presented with fever for 3 days.  On presentation, RSV/COVID/influenza PCR was negative; total bili was 4.6, platelets of 49, lactic acid of 2.6.  Chest x-ray showed mild density at the left base; UA was unremarkable.  Patient received IV antibiotics in the ED. He was found to have Enterococcus faecalis bacteremia: ID was auto consulted, antibiotics switched to IV ampicillin.  Subsequently, condition has improved and he has stopped spiking temperatures.  He feels much better and wants to go home today.  ID recommended oral amoxicillin till 09/26/2023 and outpatient follow-up with ID; ID has signed off.  Patient will be discharged home today.  Outpatient follow-up with PCP/ID/GI.  Discharge Diagnoses:   Febrile illness/presumed community-acquired pneumonia Enterococcus faecalis bacteremia -Presented with fever and chest x-ray showed possible left infiltrate with mildly elevated procalcitonin.  RSV/COVID/influenza PCR negative. -Started on IV antibiotics which has been switched to IV ampicillin as per ID recommendations.  2D echo did not show any vegetations. -No  temperature spikes over the last 48 hours -Subsequently, condition has improved and he has stopped spiking temperatures.  He feels much better and wants to go home today.  ID recommended oral amoxicillin till 09/26/2023 and outpatient follow-up with ID; ID has signed off.  Patient will be discharged home today.  Outpatient follow-up with PCP/ID   AKI -Due to poor oral intake.  Resolved with IV fluids.   Acute metabolic acidosis -Bicarb slightly improving to 19 today.  Continue sodium bicarbonate tablets.  Outpatient follow-up.   Hypokalemia -Improved   Hyponatremia -Sodium improving to 134 today.  Of IV fluids.  Currently on fluid restriction.  Outpatient follow-up.  Hypomagnesemia -Replace prior to discharge.  Outpatient follow-up.   Anemia of chronic disease -From chronic illnesses.  Monitor.  No signs of bleeding   Thrombocytopenia -Possibly from cirrhosis of liver.  Platelets stable.  Outpatient follow-up.  No signs of bleeding   Hypoalbuminemia -Possible from cirrhosis of liver.  Follow nutrition recommendations.  Encourage oral intake.   Hypocalcemia -Treated with IV calcium gluconate during this hospitalization.  Corrected calcium is improving.  Outpatient follow-up with PCP.   Alcoholic cirrhosis of liver Elevated LFTs -Reportedly has been abstaining from alcohol. 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  Outpatient follow-up with GI. -Currently not on diuretics.  Continue lactulose   Hypertension -Currently not on antihypertensives.  Outpatient follow-up   GERD Esophagitis -Continue PPI   Anxiety/depression/bipolar disorder/PTSD -Resume home regimen.  Outpatient follow-up with psychiatry   QTc prolongation -Avoid QTc prolonging medications.  Outpatient follow-up  Physical deconditioning -PT recommended home health PT  Discharge Instructions  Discharge Instructions     Ambulatory referral to Gastroenterology    Complete by: As directed    Cirrhosis follow-up   What  is the reason for referral?: Other   Diet - low sodium heart healthy   Complete by: As directed    Increase activity slowly   Complete by: As directed       Allergies as of 09/16/2023       Reactions   Apple Juice Swelling, Other (See Comments)   Tongue swelling   Cucumber Extract Itching, Nausea And Vomiting   No extracts; just cucumber   Depakote Er [divalproex Sodium Er] Swelling, Other (See Comments)   Tongue swelling   Depakote [valproic Acid] Anaphylaxis   Peanut Butter Flavoring Agent (non-screening) Anaphylaxis, Swelling   Peanut Oil Swelling   Peanut-containing Drug Products Swelling   Shellfish Allergy Itching, Swelling   Shrimp Extract Itching, Swelling   Strawberry Extract Nausea And Vomiting, Swelling   Apple Swelling   Cantaloupe Extract Allergy Skin Test Rash   Codeine Itching, Rash, Other (See Comments)   Patient reports he can take "CODONES" without problems   Firvanq [vancomycin] Rash   Lactose Intolerance (gi) Diarrhea, Other (See Comments)   Indigestion, Stomach pain, Flatulence        Medication List     STOP taking these medications    hydrOXYzine 25 MG tablet Commonly known as: ATARAX   potassium chloride SA 20 MEQ tablet Commonly known as: KLOR-CON M   saccharomyces boulardii 250 MG capsule Commonly known as: FLORASTOR       TAKE these medications    amoxicillin 500 MG capsule Commonly known as: AMOXIL Take 2 capsules (1,000 mg total) by mouth 3 (three) times daily for 12 days.   busPIRone 15 MG tablet Commonly known as: BUSPAR Take 1 tablet (15 mg total) by mouth 2 (two) times daily.   diclofenac Sodium 1 % Gel Commonly known as: VOLTAREN Apply 4 grams topically 4 (four) times daily to affected joint.   lactulose (encephalopathy) 10 GM/15ML Soln Commonly known as: CHRONULAC Take 10 g by mouth daily as needed (constipation).   magnesium oxide 400 MG tablet Commonly  known as: MAG-OX Take 1 tablet (400 mg total) by mouth 2 (two) times daily.   nicotine 21 mg/24hr patch Commonly known as: NICODERM CQ - dosed in mg/24 hours Apply patch to non-hairy, clean, dry skin on upper body between the neck and waist. Remove the previous day's patch before applying new patch. Rotate or switch site where apply daily. If you have intense, clear, troubling dreams or your cannot fall asleep only wear patch 16 hrs per day with removal at bedtime.   ondansetron 8 MG disintegrating tablet Commonly known as: ZOFRAN-ODT Dissolve 1 tablet (8 mg total) by mouth every 8 (eight) hours as needed for nausea.   pantoprazole 40 MG tablet Commonly known as: PROTONIX Take 1 tablet (40 mg total) by mouth daily.   QUEtiapine 50 MG tablet Commonly known as: SEROquel Take 0.5-1 tablets (25-50 mg total) by mouth at bedtime. What changed: how much to take   sodium bicarbonate 650 MG tablet Take 1 tablet (650 mg total) by mouth 3 (three) times daily.   Trintellix 10 MG Tabs tablet Generic drug: vortioxetine HBr Take 1 tablet (10 mg total) by mouth daily.        Follow-up Information     [facility] Follow up.   Why: resume HH services (PT/OT)               Allergies  Allergen Reactions   Apple Juice Swelling and Other (See Comments)    Tongue swelling  Cucumber Extract Itching and Nausea And Vomiting    No extracts; just cucumber   Depakote Er [Divalproex Sodium Er] Swelling and Other (See Comments)    Tongue swelling   Depakote [Valproic Acid] Anaphylaxis   Peanut Butter Flavoring Agent (Non-Screening) Anaphylaxis and Swelling   Peanut Oil Swelling   Peanut-Containing Drug Products Swelling   Shellfish Allergy Itching and Swelling   Shrimp Extract Itching and Swelling   Strawberry Extract Nausea And Vomiting and Swelling   Apple Swelling   Cantaloupe Extract Allergy Skin Test Rash   Codeine Itching, Rash and Other (See Comments)    Patient  reports he can take "CODONES" without problems   Firvanq [Vancomycin] Rash   Lactose Intolerance (Gi) Diarrhea and Other (See Comments)    Indigestion, Stomach pain, Flatulence    Consultations: ID   Procedures/Studies: ECHOCARDIOGRAM COMPLETE Result Date: 09/14/2023    ECHOCARDIOGRAM REPORT   Patient Name:   JATAVIS MALEK Date of Exam: 09/14/2023 Medical Rec #:  [id number]       Height:       65.0 in Accession #:    [id number]      Weight:       176.0 lb Date of Birth:  1966-06-07       BSA:          1.874 m Patient Age:    57 years        BP:           98/65 mmHg Patient Gender: M               HR:           78 bpm. Exam Location:  Inpatient Procedure: 2D Echo, Color Doppler and Cardiac Doppler Indications:    Bacteremia R78.81  History:        Patient has prior history of Echocardiogram examinations, most                 recent 05/05/2022.  Sonographer:    Harriette Bouillon RDCS Referring Phys: [phone] STEPHANIE N DIXON IMPRESSIONS  1. Left ventricular ejection fraction, by estimation, is 60 to 65%. The left ventricle has normal function. The left ventricle has no regional wall motion abnormalities. Left ventricular diastolic parameters were normal.  2. Right ventricular systolic function is normal. The right ventricular size is normal. Tricuspid regurgitation signal is inadequate for assessing PA pressure.  3. The mitral valve is normal in structure. Trivial mitral valve regurgitation. No evidence of mitral stenosis.  4. The aortic valve was not well visualized. There is moderate calcification of the aortic valve. Aortic valve regurgitation is not visualized. Moderate aortic valve stenosis. Vmax 2.8 m/s, MG , AVA 1.4 cm^2, DI 0.43  5. The inferior vena cava is normal in size with greater than 50% respiratory variability, suggesting right atrial pressure of 3 mmHg. Conclusion(s)/Recommendation(s): No evidence of valvular vegetations on this transthoracic echocardiogram. Consider a transesophageal  echocardiogram to exclude infective endocarditis if clinically indicated. FINDINGS  Left Ventricle: Left ventricular ejection fraction, by estimation, is 60 to 65%. The left ventricle has normal function. The left ventricle has no regional wall motion abnormalities. The left ventricular internal cavity size was normal in size. There is  no left ventricular hypertrophy. Left ventricular diastolic parameters were normal. Right Ventricle: The right ventricular size is normal. Right vetricular wall thickness was not well visualized. Right ventricular systolic function is normal. Tricuspid regurgitation signal is inadequate for assessing PA pressure. Left Atrium: Left atrial size was normal  in size. Right Atrium: Right atrial size was normal in size. Pericardium: There is no evidence of pericardial effusion. Mitral Valve: The mitral valve is normal in structure. Trivial mitral valve regurgitation. No evidence of mitral valve stenosis. Tricuspid Valve: The tricuspid valve is normal in structure. Tricuspid valve regurgitation is trivial. Aortic Valve: The aortic valve was not well visualized. There is moderate calcification of the aortic valve. Aortic valve regurgitation is not visualized. Moderate aortic stenosis is present. Aortic valve mean gradient measures 18.0 mmHg. Aortic valve peak gradient measures 31.4 mmHg. Aortic valve area, by VTI measures 1.37 cm. Pulmonic Valve: The pulmonic valve was not well visualized. Pulmonic valve regurgitation is not visualized. Aorta: The aortic root is normal in size and structure. Venous: The inferior vena cava is normal in size with greater than 50% respiratory variability, suggesting right atrial pressure of 3 mmHg. IAS/Shunts: The interatrial septum was not well visualized.  LEFT VENTRICLE PLAX 2D LVIDd:         5.10 cm   Diastology LVIDs:         2.90 cm   LV e' medial:    9.25 cm/s LV PW:         0.80 cm   LV E/e' medial:  10.6 LV IVS:        0.90 cm   LV e' lateral:   17.10  cm/s LVOT diam:     2.00 cm   LV E/e' lateral: 5.8 LV SV:         72 LV SV Index:   38 LVOT Area:     3.14 cm  RIGHT VENTRICLE         IVC TAPSE (M-mode): 1.6 cm  IVC diam: 1.50 cm LEFT ATRIUM           Index LA diam:      3.50 cm 1.87 cm/m LA Vol (A4C): 30.1 ml 16.07 ml/m  AORTIC VALVE AV Area (Vmax):    1.47 cm AV Area (Vmean):   1.46 cm AV Area (VTI):     1.37 cm AV Vmax:           280.00 cm/s AV Vmean:          193.000 cm/s AV VTI:            0.526 m AV Peak Grad:      31.4 mmHg AV Mean Grad:      18.0 mmHg LVOT Vmax:         131.00 cm/s LVOT Vmean:        89.600 cm/s LVOT VTI:          0.229 m LVOT/AV VTI ratio: 0.44  AORTA Ao Root diam: 3.20 cm MITRAL VALVE MV Area (PHT): 3.95 cm    SHUNTS MV Decel Time: 192 msec    Systemic VTI:  0.23 m MV E velocity: 98.50 cm/s  Systemic Diam: 2.00 cm MV A velocity: 67.60 cm/s MV E/A ratio:  1.46 Epifanio Lesches MD Electronically signed by Epifanio Lesches MD Signature Date/Time: 09/14/2023/12:04:22 PM    Final    DG Chest Port 1 View Result Date: 09/11/2023 CLINICAL DATA:  Weakness EXAM: PORTABLE CHEST 1 VIEW COMPARISON:  06/19/2023 FINDINGS: Artifact overlies the chest. Possible mild cardiomegaly. Patient has not taken a deep inspiration. Right lung is clear. Mild density at the left base could relate to the poor inspiration or there could be mild left base atelectasis or patchy infiltrate. No lobar consolidation or collapse. No visible effusion. IMPRESSION: Mild  density at the left base could relate to the poor inspiration or there could be mild left base atelectasis or patchy infiltrate. Electronically Signed   By: Paulina Fusi M.D.   On: 09/11/2023 11:33   [place] LIMITED JOINT SPACE STRUCTURES LOW RIGHT Result Date: 09/06/2023 Procedure: Real-time Ultrasound Guided injection of the right knee Device: Samsung [id number] Verbal informed consent obtained. Time-out conducted. Noted no overlying erythema, induration, or other signs of local infection. Skin prepped  in a sterile fashion. Local anesthesia: Topical Ethyl chloride. With sterile technique and under real time ultrasound guidance: Trace effusion noted, 1 cc Kenalog 40, 2 cc lidocaine, 2 cc bupivacaine injected easily Completed without difficulty Advised to call if fevers/chills, erythema, induration, drainage, or persistent bleeding. Images permanently stored and available for review in PACS. Impression: Technically successful ultrasound guided injection.      Subjective: Patient seen and examined at bedside.  He feels much better and wants to continue.  No fever, vomiting, abdominal pain reported. Discharge Exam: Vitals:   09/16/23 0531 09/16/23 0851  BP: (!) 94/58 (!) 102/57  Pulse: 83 84  Resp: 18 19  Temp: 98.3 F (36.8 C) 97.6 F (36.4 C)  SpO2: 93% 99%    General: Pt is alert, awake, not in acute distress.  On room air Cardiovascular: rate controlled, S1/S2 + Respiratory: bilateral decreased breath sounds at bases Abdominal: Soft, NT, ND, bowel sounds + Extremities: no edema, no cyanosis    The results of significant diagnostics from this hospitalization (including imaging, microbiology, ancillary and laboratory) are listed below for reference.     Microbiology: Recent Results (from the past 240 hours)  Resp panel by RT-PCR (RSV, Flu A&B, Covid) Anterior Nasal Swab     Status: None   Collection Time: 09/11/23 10:44 AM   Specimen: Anterior Nasal Swab  Result Value Ref Range Status   SARS Coronavirus 2 by RT PCR NEGATIVE NEGATIVE Final   Influenza A by PCR NEGATIVE NEGATIVE Final   Influenza B by PCR NEGATIVE NEGATIVE Final    Comment: (NOTE) The Xpert Xpress SARS-CoV-2/FLU/RSV plus assay is intended as an aid in the diagnosis of influenza from Nasopharyngeal swab specimens and should not be used as a sole basis for treatment. Nasal washings and aspirates are unacceptable for Xpert Xpress SARS-CoV-2/FLU/RSV testing.  Fact Sheet for  Patients: BloggerCourse.com  Fact Sheet for Healthcare Providers: SeriousBroker.it  This test is not yet approved or cleared by the [place] FDA and has been authorized for detection and/or diagnosis of SARS-CoV-2 by FDA under an Emergency Use Authorization (EUA). This EUA will remain in effect (meaning this test can be used) for the duration of the COVID-19 declaration under Section 564(b)(1) of the Act, 21 U.S.C. section 360bbb-3(b)(1), unless the authorization is terminated or revoked.     Resp Syncytial Virus by PCR NEGATIVE NEGATIVE Final    Comment: (NOTE) Fact Sheet for Patients: BloggerCourse.com  Fact Sheet for Healthcare Providers: SeriousBroker.it  This test is not yet approved or cleared by the [place] FDA and has been authorized for detection and/or diagnosis of SARS-CoV-2 by FDA under an Emergency Use Authorization (EUA). This EUA will remain in effect (meaning this test can be used) for the duration of the COVID-19 declaration under Section 564(b)(1) of the Act, 21 U.S.C. section 360bbb-3(b)(1), unless the authorization is terminated or revoked.  Performed at [facility], [address], [place], [place] [address]   Respiratory (~20 pathogens) panel by PCR     Status:  None   Collection Time: 09/11/23 10:44 AM   Specimen: Nasopharyngeal Swab; Respiratory  Result Value Ref Range Status   Adenovirus NOT DETECTED NOT DETECTED Final   Coronavirus 229E NOT DETECTED NOT DETECTED Final    Comment: (NOTE) The Coronavirus on the Respiratory Panel, DOES NOT test for the novel  Coronavirus (2019 nCoV)    Coronavirus HKU1 NOT DETECTED NOT DETECTED Final   Coronavirus NL63 NOT DETECTED NOT DETECTED Final   Coronavirus OC43 NOT DETECTED NOT DETECTED Final   Metapneumovirus NOT DETECTED NOT DETECTED Final   Rhinovirus / Enterovirus NOT DETECTED NOT  DETECTED Final   Influenza A NOT DETECTED NOT DETECTED Final   Influenza B NOT DETECTED NOT DETECTED Final   Parainfluenza Virus 1 NOT DETECTED NOT DETECTED Final   Parainfluenza Virus 2 NOT DETECTED NOT DETECTED Final   Parainfluenza Virus 3 NOT DETECTED NOT DETECTED Final   Parainfluenza Virus 4 NOT DETECTED NOT DETECTED Final   Respiratory Syncytial Virus NOT DETECTED NOT DETECTED Final   Bordetella pertussis NOT DETECTED NOT DETECTED Final   Bordetella Parapertussis NOT DETECTED NOT DETECTED Final   Chlamydophila pneumoniae NOT DETECTED NOT DETECTED Final   Mycoplasma pneumoniae NOT DETECTED NOT DETECTED Final    Comment: Performed at [facility], [address] [place], [place] [address]  Culture, blood (routine x 2)     Status: None (Preliminary result)   Collection Time: 09/11/23 11:58 AM   Specimen: BLOOD LEFT HAND  Result Value Ref Range Status   Specimen Description BLOOD LEFT HAND  Final   Special Requests   Final    BOTTLES DRAWN AEROBIC AND ANAEROBIC Blood Culture results may not be optimal due to an inadequate volume of blood received in culture bottles   Culture   Final    NO GROWTH 4 DAYS Performed at [facility], [address] [place], [place] [address]    Report Status PENDING  Incomplete  Culture, blood (routine x 2)     Status: Abnormal   Collection Time: 09/11/23 12:03 PM   Specimen: BLOOD RIGHT WRIST  Result Value Ref Range Status   Specimen Description BLOOD RIGHT WRIST  Final   Special Requests   Final    BOTTLES DRAWN AEROBIC AND ANAEROBIC Blood Culture adequate volume   Culture  Setup Time   Final    GRAM POSITIVE COCCI IN CHAINS ANAEROBIC BOTTLE ONLY CRITICAL RESULT CALLED TO, READ BACK BY AND VERIFIED WITH: Duwaine Maxin [id number] @ 1638 FH Performed at [facility], [address], [place], [place] [address]    Culture ENTEROCOCCUS FAECALIS (A)  Final   Report Status 09/15/2023 FINAL  Final   Organism ID, Bacteria  ENTEROCOCCUS FAECALIS  Final      Susceptibility   Enterococcus faecalis - MIC*    AMPICILLIN <=2 SENSITIVE Sensitive     VANCOMYCIN 1 SENSITIVE Sensitive     GENTAMICIN SYNERGY SENSITIVE Sensitive     * ENTEROCOCCUS FAECALIS  Blood Culture ID Panel (Reflexed)     Status: Abnormal   Collection Time: 09/11/23 12:03 PM  Result Value Ref Range Status   Enterococcus faecalis DETECTED (A) NOT DETECTED Final    Comment: PHARMD E. PAYTES [id number] @ 1638 FH   Enterococcus Faecium NOT DETECTED NOT DETECTED Final   Listeria monocytogenes NOT DETECTED NOT DETECTED Final   Staphylococcus species NOT DETECTED NOT DETECTED Final   Staphylococcus aureus (BCID) NOT DETECTED NOT DETECTED Final   Staphylococcus epidermidis NOT  DETECTED NOT DETECTED Final   Staphylococcus lugdunensis NOT DETECTED NOT DETECTED Final   Streptococcus species NOT DETECTED NOT DETECTED Final   Streptococcus agalactiae NOT DETECTED NOT DETECTED Final   Streptococcus pneumoniae NOT DETECTED NOT DETECTED Final   Streptococcus pyogenes NOT DETECTED NOT DETECTED Final   A.calcoaceticus-baumannii NOT DETECTED NOT DETECTED Final   Bacteroides fragilis NOT DETECTED NOT DETECTED Final   Enterobacterales NOT DETECTED NOT DETECTED Final   Enterobacter cloacae complex NOT DETECTED NOT DETECTED Final   Escherichia coli NOT DETECTED NOT DETECTED Final   Klebsiella aerogenes NOT DETECTED NOT DETECTED Final   Klebsiella oxytoca NOT DETECTED NOT DETECTED Final   Klebsiella pneumoniae NOT DETECTED NOT DETECTED Final   Proteus species NOT DETECTED NOT DETECTED Final   Salmonella species NOT DETECTED NOT DETECTED Final   Serratia marcescens NOT DETECTED NOT DETECTED Final   Haemophilus influenzae NOT DETECTED NOT DETECTED Final   Neisseria meningitidis NOT DETECTED NOT DETECTED Final   Pseudomonas aeruginosa NOT DETECTED NOT DETECTED Final   Stenotrophomonas maltophilia NOT DETECTED NOT DETECTED Final   Candida albicans NOT DETECTED NOT  DETECTED Final   Candida auris NOT DETECTED NOT DETECTED Final   Candida glabrata NOT DETECTED NOT DETECTED Final   Candida krusei NOT DETECTED NOT DETECTED Final   Candida parapsilosis NOT DETECTED NOT DETECTED Final   Candida tropicalis NOT DETECTED NOT DETECTED Final   Cryptococcus neoformans/gattii NOT DETECTED NOT DETECTED Final   Vancomycin resistance NOT DETECTED NOT DETECTED Final    Comment: Performed at [facility], [address] [place], [place] [address]  Culture, blood (Routine X 2) w Reflex to ID Panel     Status: None (Preliminary result)   Collection Time: 09/15/23 12:45 PM   Specimen: BLOOD LEFT HAND  Result Value Ref Range Status   Specimen Description BLOOD LEFT HAND  Final   Special Requests   Final    BOTTLES DRAWN AEROBIC AND ANAEROBIC Blood Culture results may not be optimal due to an inadequate volume of blood received in culture bottles   Culture   Final    NO GROWTH < 12 HOURS Performed at [facility], [address], [place], [place] [address]    Report Status PENDING  Incomplete  Culture, blood (Routine X 2) w Reflex to ID Panel     Status: None (Preliminary result)   Collection Time: 09/15/23 12:45 PM   Specimen: BLOOD RIGHT HAND  Result Value Ref Range Status   Specimen Description BLOOD RIGHT HAND  Final   Special Requests   Final    BOTTLES DRAWN AEROBIC AND ANAEROBIC Blood Culture adequate volume   Culture   Final    NO GROWTH < 12 HOURS Performed at [facility], [address] [place], [place] [address]    Report Status PENDING  Incomplete     Labs: BNP (last 3 results) No results for input(s): "BNP" in the last 8760 hours. Basic Metabolic Panel: Recent Labs  Lab 09/12/23 0117 09/13/23 0837 09/13/23 1607 09/14/23 0421 09/15/23 0406 09/16/23 0557  NA 130* 126*  --  123* 131* 134*  K 3.3* 3.9  --  3.4* 3.5 3.5  CL 104 104  --  100 108 108  CO2 20* 17*  --  17* 19* 19*  GLUCOSE 125* 105*  --  116* 95 100*  BUN  12 11  --  8 10 9   CREATININE 1.13 1.22  --  1.20 1.19 1.16  CALCIUM  7.4* 7.1*  --  6.4* 6.4* 6.5*  MG  --  1.4* 2.9* 2.0 1.7 1.6*   Liver Function Tests: Recent Labs  Lab 09/12/23 0117 09/13/23 0837 09/14/23 0421 09/15/23 0406 09/16/23 0557  AST 48* 62* 86* 68* 63*  ALT 20 25 31 28 29   ALKPHOS 64 70 68 67 83  BILITOT 2.6* 3.2* 2.5* 2.5* 2.4*  PROT 5.2* 5.5* 5.1* 4.7* 5.0*  ALBUMIN 2.0* 2.1* 1.9* 1.7* 1.9*   Recent Labs  Lab 09/11/23 1040  LIPASE 60*   Recent Labs  Lab 09/11/23 1100  AMMONIA 35   CBC: Recent Labs  Lab 09/11/23 1040 09/11/23 1346 09/12/23 0117 09/13/23 0837 09/14/23 0421 09/15/23 0406 09/16/23 0557  WBC 7.6  --  6.7 6.9 7.4 7.7 7.1  NEUTROABS 5.2  --   --  4.6 4.9 5.0 4.0  HGB 9.3*   < > 8.0* 8.7* 8.5* 8.0* 8.0*  HCT 28.0*   < > 24.4* 26.1* 25.7* 24.1* 24.5*  MCV 79.8*  --  80.0 79.1* 78.4* 79.0* 79.0*  PLT 49*  --  40* 40* 38* 39* 47*   < > = values in this interval not displayed.   Cardiac Enzymes: No results for input(s): "CKTOTAL", "CKMB", "CKMBINDEX", "TROPONINI" in the last 168 hours. BNP: Invalid input(s): "POCBNP" CBG: Recent Labs  Lab 09/13/23 0606  GLUCAP 92   D-Dimer No results for input(s): "DDIMER" in the last 72 hours. Hgb A1c No results for input(s): "HGBA1C" in the last 72 hours. Lipid Profile No results for input(s): "CHOL", "HDL", "LDLCALC", "TRIG", "CHOLHDL", "LDLDIRECT" in the last 72 hours. Thyroid function studies No results for input(s): "TSH", "T4TOTAL", "T3FREE", "THYROIDAB" in the last 72 hours.  Invalid input(s): "FREET3" Anemia work up No results for input(s): "VITAMINB12", "FOLATE", "FERRITIN", "TIBC", "IRON", "RETICCTPCT" in the last 72 hours. Urinalysis    Component Value Date/Time   COLORURINE YELLOW 09/11/2023 1700   APPEARANCEUR CLEAR 09/11/2023 1700   LABSPEC 1.005 09/11/2023 1700   PHURINE 7.0 09/11/2023 1700   GLUCOSEU NEGATIVE 09/11/2023 1700   HGBUR NEGATIVE 09/11/2023 1700    BILIRUBINUR NEGATIVE 09/11/2023 1700   BILIRUBINUR small (A) 04/17/2021 1646   KETONESUR NEGATIVE 09/11/2023 1700   PROTEINUR NEGATIVE 09/11/2023 1700   UROBILINOGEN 4.0 (A) 04/17/2021 1646   NITRITE NEGATIVE 09/11/2023 1700   LEUKOCYTESUR NEGATIVE 09/11/2023 1700   Sepsis Labs Recent Labs  Lab 09/13/23 0837 09/14/23 0421 09/15/23 0406 09/16/23 0557  WBC 6.9 7.4 7.7 7.1   Microbiology Recent Results (from the past 240 hours)  Resp panel by RT-PCR (RSV, Flu A&B, Covid) Anterior Nasal Swab     Status: None   Collection Time: 09/11/23 10:44 AM   Specimen: Anterior Nasal Swab  Result Value Ref Range Status   SARS Coronavirus 2 by RT PCR NEGATIVE NEGATIVE Final   Influenza A by PCR NEGATIVE NEGATIVE Final   Influenza B by PCR NEGATIVE NEGATIVE Final    Comment: (NOTE) The Xpert Xpress SARS-CoV-2/FLU/RSV plus assay is intended as an aid in the diagnosis of influenza from Nasopharyngeal swab specimens and should not be used as a sole basis for treatment. Nasal washings and aspirates are unacceptable for Xpert Xpress SARS-CoV-2/FLU/RSV testing.  Fact Sheet for Patients: BloggerCourse.com  Fact Sheet for Healthcare Providers: SeriousBroker.it  This test is not yet approved or cleared by the [place] FDA and has been authorized for detection and/or diagnosis of SARS-CoV-2 by FDA under an Emergency Use Authorization (EUA). This EUA will remain in effect (meaning this  test can be used) for the duration of the COVID-19 declaration under Section 564(b)(1) of the Act, 21 U.S.C. section 360bbb-3(b)(1), unless the authorization is terminated or revoked.     Resp Syncytial Virus by PCR NEGATIVE NEGATIVE Final    Comment: (NOTE) Fact Sheet for Patients: BloggerCourse.com  Fact Sheet for Healthcare Providers: SeriousBroker.it  This test is not yet approved or cleared by the  [place] FDA and has been authorized for detection and/or diagnosis of SARS-CoV-2 by FDA under an Emergency Use Authorization (EUA). This EUA will remain in effect (meaning this test can be used) for the duration of the COVID-19 declaration under Section 564(b)(1) of the Act, 21 U.S.C. section 360bbb-3(b)(1), unless the authorization is terminated or revoked.  Performed at [facility], [address] [place], [place] [address]   Respiratory (~20 pathogens) panel by PCR     Status: None   Collection Time: 09/11/23 10:44 AM   Specimen: Nasopharyngeal Swab; Respiratory  Result Value Ref Range Status   Adenovirus NOT DETECTED NOT DETECTED Final   Coronavirus 229E NOT DETECTED NOT DETECTED Final    Comment: (NOTE) The Coronavirus on the Respiratory Panel, DOES NOT test for the novel  Coronavirus (2019 nCoV)    Coronavirus HKU1 NOT DETECTED NOT DETECTED Final   Coronavirus NL63 NOT DETECTED NOT DETECTED Final   Coronavirus OC43 NOT DETECTED NOT DETECTED Final   Metapneumovirus NOT DETECTED NOT DETECTED Final   Rhinovirus / Enterovirus NOT DETECTED NOT DETECTED Final   Influenza A NOT DETECTED NOT DETECTED Final   Influenza B NOT DETECTED NOT DETECTED Final   Parainfluenza Virus 1 NOT DETECTED NOT DETECTED Final   Parainfluenza Virus 2 NOT DETECTED NOT DETECTED Final   Parainfluenza Virus 3 NOT DETECTED NOT DETECTED Final   Parainfluenza Virus 4 NOT DETECTED NOT DETECTED Final   Respiratory Syncytial Virus NOT DETECTED NOT DETECTED Final   Bordetella pertussis NOT DETECTED NOT DETECTED Final   Bordetella Parapertussis NOT DETECTED NOT DETECTED Final   Chlamydophila pneumoniae NOT DETECTED NOT DETECTED Final   Mycoplasma pneumoniae NOT DETECTED NOT DETECTED Final    Comment: Performed at [facility], [address], [place], [place] [address]  Culture, blood (routine x 2)     Status: None (Preliminary result)   Collection Time: 09/11/23 11:58 AM   Specimen: BLOOD  LEFT HAND  Result Value Ref Range Status   Specimen Description BLOOD LEFT HAND  Final   Special Requests   Final    BOTTLES DRAWN AEROBIC AND ANAEROBIC Blood Culture results may not be optimal due to an inadequate volume of blood received in culture bottles   Culture   Final    NO GROWTH 4 DAYS Performed at [facility], [address] [place], [place] [address]    Report Status PENDING  Incomplete  Culture, blood (routine x 2)     Status: Abnormal   Collection Time: 09/11/23 12:03 PM   Specimen: BLOOD RIGHT WRIST  Result Value Ref Range Status   Specimen Description BLOOD RIGHT WRIST  Final   Special Requests   Final    BOTTLES DRAWN AEROBIC AND ANAEROBIC Blood Culture adequate volume   Culture  Setup Time   Final    GRAM POSITIVE COCCI IN CHAINS ANAEROBIC BOTTLE ONLY CRITICAL RESULT CALLED TO, READ BACK BY AND VERIFIED WITH: Duwaine Maxin [id number] @ 1638 FH Performed at [facility], [address], [place], [place] [address]  Culture ENTEROCOCCUS FAECALIS (A)  Final   Report Status 09/15/2023 FINAL  Final   Organism ID, Bacteria ENTEROCOCCUS FAECALIS  Final      Susceptibility   Enterococcus faecalis - MIC*    AMPICILLIN <=2 SENSITIVE Sensitive     VANCOMYCIN 1 SENSITIVE Sensitive     GENTAMICIN SYNERGY SENSITIVE Sensitive     * ENTEROCOCCUS FAECALIS  Blood Culture ID Panel (Reflexed)     Status: Abnormal   Collection Time: 09/11/23 12:03 PM  Result Value Ref Range Status   Enterococcus faecalis DETECTED (A) NOT DETECTED Final    Comment: PHARMD E. PAYTES [id number] @ 1638 FH   Enterococcus Faecium NOT DETECTED NOT DETECTED Final   Listeria monocytogenes NOT DETECTED NOT DETECTED Final   Staphylococcus species NOT DETECTED NOT DETECTED Final   Staphylococcus aureus (BCID) NOT DETECTED NOT DETECTED Final   Staphylococcus epidermidis NOT DETECTED NOT DETECTED Final   Staphylococcus lugdunensis NOT DETECTED NOT DETECTED Final   Streptococcus species NOT  DETECTED NOT DETECTED Final   Streptococcus agalactiae NOT DETECTED NOT DETECTED Final   Streptococcus pneumoniae NOT DETECTED NOT DETECTED Final   Streptococcus pyogenes NOT DETECTED NOT DETECTED Final   A.calcoaceticus-baumannii NOT DETECTED NOT DETECTED Final   Bacteroides fragilis NOT DETECTED NOT DETECTED Final   Enterobacterales NOT DETECTED NOT DETECTED Final   Enterobacter cloacae complex NOT DETECTED NOT DETECTED Final   Escherichia coli NOT DETECTED NOT DETECTED Final   Klebsiella aerogenes NOT DETECTED NOT DETECTED Final   Klebsiella oxytoca NOT DETECTED NOT DETECTED Final   Klebsiella pneumoniae NOT DETECTED NOT DETECTED Final   Proteus species NOT DETECTED NOT DETECTED Final   Salmonella species NOT DETECTED NOT DETECTED Final   Serratia marcescens NOT DETECTED NOT DETECTED Final   Haemophilus influenzae NOT DETECTED NOT DETECTED Final   Neisseria meningitidis NOT DETECTED NOT DETECTED Final   Pseudomonas aeruginosa NOT DETECTED NOT DETECTED Final   Stenotrophomonas maltophilia NOT DETECTED NOT DETECTED Final   Candida albicans NOT DETECTED NOT DETECTED Final   Candida auris NOT DETECTED NOT DETECTED Final   Candida glabrata NOT DETECTED NOT DETECTED Final   Candida krusei NOT DETECTED NOT DETECTED Final   Candida parapsilosis NOT DETECTED NOT DETECTED Final   Candida tropicalis NOT DETECTED NOT DETECTED Final   Cryptococcus neoformans/gattii NOT DETECTED NOT DETECTED Final   Vancomycin resistance NOT DETECTED NOT DETECTED Final    Comment: Performed at [facility], [address] [place], [place] [address]  Culture, blood (Routine X 2) w Reflex to ID Panel     Status: None (Preliminary result)   Collection Time: 09/15/23 12:45 PM   Specimen: BLOOD LEFT HAND  Result Value Ref Range Status   Specimen Description BLOOD LEFT HAND  Final   Special Requests   Final    BOTTLES DRAWN AEROBIC AND ANAEROBIC Blood Culture results may not be optimal due to an inadequate  volume of blood received in culture bottles   Culture   Final    NO GROWTH < 12 HOURS Performed at [facility], [address] [place], [place] [address]    Report Status PENDING  Incomplete  Culture, blood (Routine X 2) w Reflex to ID Panel     Status: None (Preliminary result)   Collection Time: 09/15/23 12:45 PM   Specimen: BLOOD RIGHT HAND  Result Value Ref Range Status   Specimen Description BLOOD RIGHT HAND  Final   Special Requests   Final    BOTTLES DRAWN  AEROBIC AND ANAEROBIC Blood Culture adequate volume   Culture   Final    NO GROWTH < 12 HOURS Performed at [facility], [address] [place], [place] [address]    Report Status PENDING  Incomplete     Time coordinating discharge: 35 minutes  SIGNED:   Glade Lloyd, MD  [facility] 09/16/2023, 8:54 AM

## 2023-09-16 NOTE — Progress Notes (Signed)
Discharge instructions given. Patient verbalized understanding and all questions were answered.  ?

## 2023-09-16 NOTE — Progress Notes (Signed)
DISCHARGE NOTE HOME Juan [name] to be discharged Home per MD order. Discussed prescriptions and follow up appointments with the patient. Prescriptions given to patient; medication list explained in detail. Patient verbalized understanding.  Skin clean, dry and intact without evidence of skin break down, no evidence of skin tears noted. IV catheter discontinued intact. Site without signs and symptoms of complications. Dressing and pressure applied. Pt denies pain at the site currently. No complaints noted.  Patient free of lines, drains, and wounds.   An After Visit Summary (AVS) was printed and given to the patient. Patient escorted via wheelchair to discharge lounge, wife called for pick up. Margarita Grizzle, RN

## 2023-09-19 ENCOUNTER — Telehealth: Payer: Self-pay

## 2023-09-19 ENCOUNTER — Other Ambulatory Visit: Payer: Self-pay | Admitting: Family Medicine

## 2023-09-19 NOTE — Transitions of Care (Post Inpatient/ED Visit) (Unsigned)
   09/19/2023  Name: Juan [name] MRN: [id number] DOB: [birth year]  Today's TOC FU Call Status: Today's TOC FU Call Status:: Unsuccessful Call (1st Attempt) Unsuccessful Call (1st Attempt) Date: 09/19/23  Attempted to reach the patient regarding the most recent Inpatient/ED visit.  Follow Up Plan: Additional outreach attempts will be made to reach the patient to complete the Transitions of Care (Post Inpatient/ED visit) call.   Signature Karena Addison, LPN [facility] Nurse Health Advisor Direct Dial [phone]

## 2023-09-20 ENCOUNTER — Other Ambulatory Visit: Payer: Self-pay

## 2023-09-20 ENCOUNTER — Other Ambulatory Visit (HOSPITAL_BASED_OUTPATIENT_CLINIC_OR_DEPARTMENT_OTHER): Payer: Self-pay

## 2023-09-20 LAB — CULTURE, BLOOD (ROUTINE X 2)
Culture: NO GROWTH
Culture: NO GROWTH
Special Requests: ADEQUATE

## 2023-09-20 MED ORDER — QUETIAPINE FUMARATE 50 MG PO TABS
25.0000 mg | ORAL_TABLET | Freq: Every day | ORAL | 0 refills | Status: DC
  Filled 2023-09-20: qty 90, 90d supply, fill #0

## 2023-09-20 NOTE — Transitions of Care (Post Inpatient/ED Visit) (Signed)
09/20/2023  Name: Juan [name] MRN: [id number] DOB: [birth year]  Today's TOC FU Call Status: Today's TOC FU Call Status:: Successful TOC FU Call Completed Unsuccessful Call (1st Attempt) Date: 09/19/23 [facility] FU Call Complete Date: 09/20/23 Patient's Name and Date of Birth confirmed.  Transition Care Management Follow-up Telephone Call Date of Discharge: 09/16/23 Discharge Facility: [facility]) Type of Discharge: Inpatient Admission Primary Inpatient Discharge Diagnosis:: weakness How have you been since you were released from the hospital?: Better Any questions or concerns?: No  Items Reviewed: Did you receive and understand the discharge instructions provided?: Yes Medications obtained,verified, and reconciled?: Yes (Medications Reviewed) Any new allergies since your discharge?: No Dietary orders reviewed?: Yes Do you have support at home?: Yes People in Home: spouse  Medications Reviewed Today: Medications Reviewed Today     Reviewed by Karena Addison, LPN (Licensed Practical Nurse) on 09/20/23 at 1543  Med List Status: <None>   Medication Order Taking? Sig Documenting Provider Last Dose Status Informant  amoxicillin (AMOXIL) 500 MG capsule [id number]  Take 2 capsules (1,000 mg total) by mouth 3 (three) times daily for 12 days. Gardiner Barefoot, MD  Active   busPIRone (BUSPAR) 15 MG tablet [id number] No Take 1 tablet (15 mg total) by mouth 2 (two) times daily.  Patient not taking: Reported on 09/11/2023   Everrett Coombe, DO Not Taking Active Pharmacy Records, Spouse/Significant Other           Med Note Genella Rife Jun 19, 2023 12:02 PM)    diclofenac Sodium (VOLTAREN) 1 % GEL [id number] No Apply 4 grams topically 4 (four) times daily to affected joint.  Patient not taking: Reported on 09/11/2023   Monica Becton, MD Not Taking Active Pharmacy Records, Spouse/Significant Other  lactulose, encephalopathy, (CHRONULAC) 10 GM/15ML SOLN [id number] No  Take 10 g by mouth daily as needed (constipation).  Patient not taking: Reported on 09/11/2023   [provider] Not Taking Active Pharmacy Records, Spouse/Significant Other  magnesium oxide (MAG-OX) 400 MG tablet [id number] No Take 1 tablet (400 mg total) by mouth 2 (two) times daily. Everrett Coombe, DO Past Week Active Pharmacy Records, Spouse/Significant Other  nicotine (NICODERM CQ - DOSED IN MG/24 HOURS) 21 mg/24hr patch [id number] No Apply patch to non-hairy, clean, dry skin on upper body between the neck and waist. Remove the previous day's patch before applying new patch. Rotate or switch site where apply daily. If you have intense, clear, troubling dreams or your cannot fall asleep only wear patch 16 hrs per day with removal at bedtime.  Patient not taking: Reported on 09/11/2023    Not Taking Active Pharmacy Records, Spouse/Significant Other  ondansetron (ZOFRAN-ODT) 8 MG disintegrating tablet [id number] No Dissolve 1 tablet (8 mg total) by mouth every 8 (eight) hours as needed for nausea.  Patient not taking: Reported on 09/11/2023   Cathren Harsh, MD Not Taking Active Pharmacy Records, Spouse/Significant Other  pantoprazole (PROTONIX) 40 MG tablet [id number] No Take 1 tablet (40 mg total) by mouth daily.  Patient not taking: Reported on 09/11/2023   Imogene Burn, MD Not Taking Active Pharmacy Records, Spouse/Significant Other  QUEtiapine (SEROQUEL) 50 MG tablet [id number]  Take 0.5-1 tablets (25-50 mg total) by mouth at bedtime. Everrett Coombe, DO  Active   sodium bicarbonate 650 MG tablet [id number]  Take 1 tablet (650 mg total) by mouth 3 (three) times daily. Glade Lloyd, MD  Active   vortioxetine HBr (TRINTELLIX) 10 MG TABS tablet [id number] No  Take 1 tablet (10 mg total) by mouth daily.  Patient not taking: Reported on 09/11/2023   Everrett Coombe, DO Not Taking Active Pharmacy Records, Spouse/Significant Other            Home Care and Equipment/Supplies: Were Home Health  Services Ordered?: Yes Name of [facility] Health Agency[facility] Has Agency set up a time to come to your home?: Yes First Home Health Visit Date: 09/18/23 Any new equipment or medical supplies ordered?: NA  Functional Questionnaire: Do you need assistance with bathing/showering or dressing?: Yes Do you need assistance with meal preparation?: Yes Do you need assistance with eating?: No Do you have difficulty maintaining continence: No Do you need assistance with getting out of bed/getting out of a chair/moving?: No Do you have difficulty managing or taking your medications?: Yes  Follow up appointments reviewed: PCP Follow-up appointment confirmed?: Yes Date of PCP follow-up appointment?: 09/21/23 Follow-up Provider: [facility] Follow-up appointment confirmed?: NA Do you need transportation to your follow-up appointment?: No Do you understand care options if your condition(s) worsen?: Yes-patient verbalized understanding    SIGNATURE Karena Addison, LPN [facility] Nurse Health Advisor Direct Dial [phone]

## 2023-09-21 ENCOUNTER — Encounter: Payer: Self-pay | Admitting: Family Medicine

## 2023-09-21 ENCOUNTER — Telehealth: Payer: Self-pay | Admitting: Internal Medicine

## 2023-09-21 ENCOUNTER — Inpatient Hospital Stay: Payer: Commercial Managed Care - PPO | Admitting: Family Medicine

## 2023-09-21 ENCOUNTER — Ambulatory Visit (INDEPENDENT_AMBULATORY_CARE_PROVIDER_SITE_OTHER): Payer: Commercial Managed Care - PPO | Admitting: Family Medicine

## 2023-09-21 DIAGNOSIS — I10 Essential (primary) hypertension: Secondary | ICD-10-CM

## 2023-09-21 DIAGNOSIS — B952 Enterococcus as the cause of diseases classified elsewhere: Secondary | ICD-10-CM | POA: Diagnosis not present

## 2023-09-21 DIAGNOSIS — R7881 Bacteremia: Secondary | ICD-10-CM | POA: Diagnosis not present

## 2023-09-21 HISTORY — DX: Bacteremia: B95.2

## 2023-09-21 NOTE — Telephone Encounter (Signed)
Urgent referral in [facility] for Hyperbilirubinemia.  Please advise scheduling.  Thanks

## 2023-09-21 NOTE — Telephone Encounter (Signed)
Patient of Dr Leonides Schanz. He was a no-show 02/25/23 for hospital follow up. Spoke with the patient. He agrees to come in to see Dr Leonides Schanz 10/03/23 at 10:30 am for cirrhosis follow up.  Appointment scheduled from the referral.

## 2023-09-21 NOTE — Assessment & Plan Note (Signed)
He will complete course of amoxicillin.  Referral placed to ID.  He will contact GI for an appointment.  Recheck electrolytes, CBC and magnesium.

## 2023-09-21 NOTE — Progress Notes (Signed)
Juan [name] - 58 y.o. male MRN [id number]  Date of birth: 03-Feb-1966  Subjective Chief Complaint  Patient presents with   Hospitalization Follow-up    HPI Juan [name] is a 58 y.o. male here today for follow up visit.   He was recently hospitalized from 09/11/23-09/16/23 with complaint of generalized weakness and fever.  Found to have enterococcus bacteremia.  Treated with IV ampicillin.  Symptoms improved and he remained afebrile. Continuation of amoxicillin recommended until 09/26/23.  Echo with vegetations.  Outpatient GI and ID follow up recommended as well.  He has not heard anything regarding scheduling with ID.  He does have a hepatologist and I encouraged follow-up with him.  Overall he reports that he is feeling better.  ROS:  A comprehensive ROS was completed and negative except as noted per HPI  Allergies  Allergen Reactions   Apple Juice Swelling and Other (See Comments)    Tongue swelling   Cucumber Extract Itching and Nausea And Vomiting    No extracts; just cucumber   Depakote Er [Divalproex Sodium Er] Swelling and Other (See Comments)    Tongue swelling   Depakote [Valproic Acid] Anaphylaxis   Peanut Butter Flavoring Agent (Non-Screening) Anaphylaxis and Swelling   Peanut Oil Swelling   Peanut-Containing Drug Products Swelling   Shellfish Allergy Itching and Swelling   Shrimp Extract Itching and Swelling   Strawberry Extract Nausea And Vomiting and Swelling   Apple Swelling   Cantaloupe Extract Allergy Skin Test Rash   Codeine Itching, Rash and Other (See Comments)    Patient reports he can take "CODONES" without problems   Firvanq [Vancomycin] Rash   Lactose Intolerance (Gi) Diarrhea and Other (See Comments)    Indigestion, Stomach pain, Flatulence    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   Chronic  fatigue    Cirrhosis (HCC)    Colon polyps    Depression    GERD (gastroesophageal reflux disease)    Hemochromatosis    Hiatal hernia 02/20/2022   Hx of blood clots    Leg   Hyperreflexia    Hypertension    PTSD (post-traumatic stress disorder)    Traumatic hemorrhagic shock [facility])     Past Surgical History:  Procedure Laterality Date   BIOPSY  09/24/2021   Procedure: BIOPSY;  Surgeon: Imogene Burn, MD;  Location: [facility] ENDOSCOPY;  Service: Gastroenterology;;   Fidela Salisbury RELEASE Bilateral    COLONOSCOPY WITH PROPOFOL N/A 09/24/2021   Procedure: COLONOSCOPY WITH PROPOFOL;  Surgeon: Imogene Burn, MD;  Location: [facility] ENDOSCOPY;  Service: Gastroenterology;  Laterality: N/A;   ESOPHAGOGASTRODUODENOSCOPY (EGD) WITH PROPOFOL N/A 09/24/2021   Procedure: ESOPHAGOGASTRODUODENOSCOPY (EGD) WITH PROPOFOL;  Surgeon: Imogene Burn, MD;  Location: [facility] ENDOSCOPY;  Service: Gastroenterology;  Laterality: N/A;   ESOPHAGOGASTRODUODENOSCOPY (EGD) WITH PROPOFOL N/A 08/18/2022   Procedure: ESOPHAGOGASTRODUODENOSCOPY (EGD) WITH PROPOFOL;  Surgeon: Hilarie Fredrickson, MD;  Location: [facility] ENDOSCOPY;  Service: Gastroenterology;  Laterality: N/A;   FLEXIBLE SIGMOIDOSCOPY N/A 08/18/2022   Procedure: FLEXIBLE SIGMOIDOSCOPY;  Surgeon: Hilarie Fredrickson, MD;  Location: [facility] ENDOSCOPY;  Service: Gastroenterology;  Laterality: N/A;   FRACTURE SURGERY     left ankle plate   HERNIA REPAIR     inguinal   KNEE SURGERY Right    x 4   SHOULDER SURGERY Bilateral    x 2  VASECTOMY      Social History   Socioeconomic History   Marital status: Married    Spouse name: [name]   Number of children: 2   Years of education: Not on file   Highest education level: 12th grade  Occupational History    Comment: disability  Tobacco Use   Smoking status: Never   Smokeless tobacco: Current    Types: Snuff  Vaping Use   Vaping status: Never Used  Substance and Sexual Activity   Alcohol use: Not Currently   Drug use: Never    Sexual activity: Yes    Partners: Female  Other Topics Concern   Not on file  Social History Narrative   Lives with wife   Social Drivers of Health   Financial Resource Strain: Medium Risk (01/09/2023)   Overall Financial Resource Strain (CARDIA)    Difficulty of Paying Living Expenses: Somewhat hard  Food Insecurity: No Food Insecurity (09/11/2023)   Hunger Vital Sign    Worried About Running Out of Food in the Last Year: Never true    Ran Out of Food in the Last Year: Never true  Transportation Needs: No Transportation Needs (09/11/2023)   PRAPARE - Administrator, Civil Service (Medical): No    Lack of Transportation (Non-Medical): No  Physical Activity: Insufficiently Active (01/09/2023)   Exercise Vital Sign    Days of Exercise per Week: 1 day    Minutes of Exercise per Session: 10 min  Stress: Stress Concern Present (01/09/2023)   [employer] of Occupational Health - Occupational Stress Questionnaire    Feeling of Stress : To some extent  Social Connections: Patient Declined (09/11/2023)   Social Connection and Isolation Panel [NHANES]    Frequency of Communication with Friends and Family: Patient declined    Frequency of Social Gatherings with Friends and Family: Patient declined    Attends Religious Services: Patient declined    Database administrator or Organizations: Patient declined    Attends Engineer, structural: Patient declined    Marital Status: Patient declined    Family History  Problem Relation Age of Onset   Pulmonary fibrosis Mother    Hypertension Father    Other Father        liver failure   Diabetes Brother    Breast cancer Paternal Aunt    Colon cancer Neg Hx    Esophageal cancer Neg Hx    Stomach cancer Neg Hx    Rectal cancer Neg Hx     Health Maintenance  Topic Date Due   COVID-19 Vaccine (4 - 2024-25 season) 04/24/2023   INFLUENZA VACCINE  11/21/2023 (Originally 03/24/2023)   DTaP/Tdap/Td (3 - Td or Tdap) 06/20/2029    Colonoscopy  09/25/2031   Pneumococcal Vaccine 83-73 Years old  Completed   Hepatitis C Screening  Completed   HIV Screening  Completed   Zoster Vaccines- Shingrix  Completed   HPV VACCINES  Aged Out     ----------------------------------------------------------------------------------------------------------------------------------------------------------------------------------------------------------------- Physical Exam BP 102/64 (BP Location: Left Arm, Patient Position: Sitting, Cuff Size: Normal)   Pulse 88   Ht 5\' 5"  (1.651 m)   Wt 195 lb 9.6 oz (88.7 kg)   SpO2 97%   BMI 32.55 kg/m   Physical Exam Constitutional:      Appearance: Normal appearance.  HENT:     Head: Normocephalic and atraumatic.  Cardiovascular:     Rate and Rhythm: Normal rate and regular rhythm.  Pulmonary:     Effort:  Pulmonary effort is normal.     Breath sounds: Normal breath sounds.  Musculoskeletal:     Cervical back: Neck supple.  Neurological:     Mental Status: He is alert.  Psychiatric:        Mood and Affect: Mood normal.        Behavior: Behavior normal.     ------------------------------------------------------------------------------------------------------------------------------------------------------------------------------------------------------------------- Assessment and Plan  Bacteremia due to Enterococcus He will complete course of amoxicillin.  Referral placed to ID.  He will contact GI for an appointment.  Recheck electrolytes, CBC and magnesium.   No orders of the defined types were placed in this encounter.   No follow-ups on file.    This visit occurred during the SARS-CoV-2 public health emergency.  Safety protocols were in place, including screening questions prior to the visit, additional usage of staff PPE, and extensive cleaning of exam room while observing appropriate contact time as indicated for disinfecting solutions.

## 2023-09-22 LAB — CMP14+EGFR
ALT: 30 [IU]/L (ref 0–44)
AST: 79 [IU]/L — ABNORMAL HIGH (ref 0–40)
Albumin: 2.5 g/dL — ABNORMAL LOW (ref 3.8–4.9)
Alkaline Phosphatase: 106 [IU]/L (ref 44–121)
BUN/Creatinine Ratio: 10 (ref 9–20)
BUN: 9 mg/dL (ref 6–24)
Bilirubin Total: 2 mg/dL — ABNORMAL HIGH (ref 0.0–1.2)
CO2: 19 mmol/L — ABNORMAL LOW (ref 20–29)
Calcium: 7.9 mg/dL — ABNORMAL LOW (ref 8.7–10.2)
Chloride: 108 mmol/L — ABNORMAL HIGH (ref 96–106)
Creatinine, Ser: 0.89 mg/dL (ref 0.76–1.27)
Globulin, Total: 2.8 g/dL (ref 1.5–4.5)
Glucose: 110 mg/dL — ABNORMAL HIGH (ref 70–99)
Potassium: 3.3 mmol/L — ABNORMAL LOW (ref 3.5–5.2)
Sodium: 141 mmol/L (ref 134–144)
Total Protein: 5.3 g/dL — ABNORMAL LOW (ref 6.0–8.5)
eGFR: 101 mL/min/{1.73_m2} (ref >59)

## 2023-09-22 LAB — CBC WITH DIFFERENTIAL/PLATELET
Basophils Absolute: 0.1 10*3/uL (ref 0.0–0.2)
Basos: 1 %
EOS (ABSOLUTE): 0.3 10*3/uL (ref 0.0–0.4)
Eos: 6 %
Hematocrit: 23.2 % — ABNORMAL LOW (ref 37.5–51.0)
Hemoglobin: 7.6 g/dL — ABNORMAL LOW (ref 13.0–17.7)
Immature Grans (Abs): 0 10*3/uL (ref 0.0–0.1)
Immature Granulocytes: 0 %
Lymphocytes Absolute: 1.7 10*3/uL (ref 0.7–3.1)
Lymphs: 35 %
MCH: 26.3 pg — ABNORMAL LOW (ref 26.6–33.0)
MCHC: 32.8 g/dL (ref 31.5–35.7)
MCV: 80 fL (ref 79–97)
Monocytes Absolute: 0.8 10*3/uL (ref 0.1–0.9)
Monocytes: 17 %
Neutrophils Absolute: 2 10*3/uL (ref 1.4–7.0)
Neutrophils: 41 %
Platelets: 101 10*3/uL — ABNORMAL LOW (ref 150–450)
RBC: 2.89 x10E6/uL — ABNORMAL LOW (ref 4.14–5.80)
RDW: 20.8 % — ABNORMAL HIGH (ref 11.6–15.4)
WBC: 5 10*3/uL (ref 3.4–10.8)

## 2023-09-22 LAB — MAGNESIUM: Magnesium: 1.6 mg/dL (ref 1.6–2.3)

## 2023-09-22 NOTE — Telephone Encounter (Unsigned)
Copied from CRM [id number]. Topic: Clinical - Lab/Test Results >> Sep 22, 2023 11:17 AM Gildardo Pounds wrote: Reason for CRM: Patient wants to discuss the lab results. Callback number is [phone]

## 2023-09-23 ENCOUNTER — Encounter (HOSPITAL_BASED_OUTPATIENT_CLINIC_OR_DEPARTMENT_OTHER): Payer: Self-pay | Admitting: Urology

## 2023-09-23 ENCOUNTER — Encounter: Payer: Self-pay | Admitting: Family Medicine

## 2023-09-23 ENCOUNTER — Emergency Department (HOSPITAL_BASED_OUTPATIENT_CLINIC_OR_DEPARTMENT_OTHER): Payer: No Typology Code available for payment source

## 2023-09-23 ENCOUNTER — Emergency Department (HOSPITAL_BASED_OUTPATIENT_CLINIC_OR_DEPARTMENT_OTHER)
Admission: EM | Admit: 2023-09-23 | Discharge: 2023-09-23 | Discharge status: Against Medical Advice | Payer: No Typology Code available for payment source | Attending: Emergency Medicine | Admitting: Emergency Medicine

## 2023-09-23 ENCOUNTER — Ambulatory Visit: Payer: Self-pay | Admitting: Family Medicine

## 2023-09-23 ENCOUNTER — Other Ambulatory Visit: Payer: Self-pay

## 2023-09-23 DIAGNOSIS — M7989 Other specified soft tissue disorders: Secondary | ICD-10-CM | POA: Diagnosis present

## 2023-09-23 DIAGNOSIS — R600 Localized edema: Secondary | ICD-10-CM | POA: Insufficient documentation

## 2023-09-23 DIAGNOSIS — J189 Pneumonia, unspecified organism: Secondary | ICD-10-CM | POA: Insufficient documentation

## 2023-09-23 DIAGNOSIS — Z5321 Procedure and treatment not carried out due to patient leaving prior to being seen by health care provider: Secondary | ICD-10-CM | POA: Diagnosis not present

## 2023-09-23 LAB — BRAIN NATRIURETIC PEPTIDE: B Natriuretic Peptide: 102.5 pg/mL — ABNORMAL HIGH (ref 0.0–100.0)

## 2023-09-23 LAB — BASIC METABOLIC PANEL
Anion gap: 7 (ref 5–15)
BUN: 10 mg/dL (ref 6–20)
CO2: 22 mmol/L (ref 22–32)
Calcium: 8.2 mg/dL — ABNORMAL LOW (ref 8.9–10.3)
Chloride: 109 mmol/L (ref 98–111)
Creatinine, Ser: 0.81 mg/dL (ref 0.61–1.24)
GFR, Estimated: >60 mL/min (ref >60)
Glucose, Bld: 105 mg/dL — ABNORMAL HIGH (ref 70–99)
Potassium: 3.6 mmol/L (ref 3.5–5.1)
Sodium: 138 mmol/L (ref 135–145)

## 2023-09-23 LAB — CBC
HCT: 24.9 % — ABNORMAL LOW (ref 39.0–52.0)
Hemoglobin: 8 g/dL — ABNORMAL LOW (ref 13.0–17.0)
MCH: 25.9 pg — ABNORMAL LOW (ref 26.0–34.0)
MCHC: 32.1 g/dL (ref 30.0–36.0)
MCV: 80.6 fL (ref 80.0–100.0)
Platelets: 109 10*3/uL — ABNORMAL LOW (ref 150–400)
RBC: 3.09 MIL/uL — ABNORMAL LOW (ref 4.22–5.81)
RDW: 23.5 % — ABNORMAL HIGH (ref 11.5–15.5)
WBC: 5.6 10*3/uL (ref 4.0–10.5)
nRBC: 0 % (ref 0.0–0.2)

## 2023-09-23 LAB — TROPONIN I (HIGH SENSITIVITY): Troponin I (High Sensitivity): 9 ng/L (ref <18)

## 2023-09-23 NOTE — ED Notes (Signed)
Pt left states he no longer wants to wait. Instructed to return for any new or worsening symptoms and to follow up with PCP as needed.  Iv removed prior to DC

## 2023-09-23 NOTE — Telephone Encounter (Signed)
Copied from CRM [id number]. Topic: Clinical - Red Word Triage >> Sep 23, 2023  8:40 AM Juan [name] wrote: [facility] that prompted transfer to Nurse Triage: retaining fluid in both legs, started when he got out of the hospital last week   Chief Complaint: bilateral leg swelling Symptoms: 8/10 pain Frequency: ongoing since around 09/18/23 Pertinent Negatives: Patient denies redness, chest pain or worsening shortness of breath  Disposition: [] ED /[] Urgent Care (no appt availability in office) / [x] Appointment(In office/virtual)/ []  [place] [facility]/ [] Home Care/ [x] Refused Recommended Disposition /[] [place] [facility]/ []  Follow-up with PCP Additional Notes: The patient reported that he was discharged from the hospital 09/16/23 and "a couple days after" he noticed swelling in his legs.  He reported a 10 pound weight gain since that time.  The swelling is pitting and up to his knees. He is recovering from pneumonia so he has some shortness of breath but it has not worsened since his hospital discharge.  He reported 8/10 pain at rest.  He declined an office visit today as he has physical therapy and a meeting this afternoon.  He inquired if he could wait until his appointment with Dr. Karie Schwalbe on Tuesday to be evaluated.  Advised that based on his reported symptoms he should be seen within 24 hours.  He said "today will not work."  Routed to pcp to further advise.   Reason for Disposition  [1] MODERATE leg swelling (e.g., swelling extends up to knees) AND [2] new-onset or worsening  Answer Assessment - Initial Assessment Questions 1. ONSET: "When did the swelling start?" (e.g., minutes, hours, days)     2 days after being discharged from the hospital   09/18/23 Gained about 10 pounds of fluid  2. LOCATION: "What part of the leg is swollen?"  "Are both legs swollen or just one leg?"     Bilateral from knee down  3. SEVERITY: "How bad is the swelling?" (e.g., localized; mild, moderate, severe)   -  Localized: Small area of swelling localized to one leg.   - MILD pedal edema: Swelling limited to foot and ankle, pitting edema < 1/4 inch (6 mm) deep, rest and elevation eliminate most or all swelling.   - MODERATE edema: Swelling of lower leg to knee, pitting edema > 1/4 inch (6 mm) deep, rest and elevation only partially reduce swelling.   - SEVERE edema: Swelling extends above knee, facial or hand swelling present.      Pitting edema  4. REDNESS: "Does the swelling look red or infected?"     None  5. PAIN: "Is the swelling painful to touch?" If Yes, ask: "How painful is it?"   (Scale 1-10; mild, moderate or severe)     8/10 constant has not elevated feet but wearing compression socks  6. FEVER: "Do you have a fever?" If Yes, ask: "What is it, how was it measured, and when did it start?"      None  7. CAUSE: "What do you think is causing the leg swelling?"     Unsure  8. MEDICAL HISTORY: "Do you have a history of blood clots (e.g., DVT), cancer, heart failure, kidney disease, or liver failure?"     Heart murmur, neuropathy  9. RECURRENT SYMPTOM: "Have you had leg swelling before?" If Yes, ask: "When was the last time?" "What happened that time?"     No 10. OTHER SYMPTOMS: "Do you have any other symptoms?" (e.g., chest pain, difficulty breathing)  Still dealing with pneumonia so I get short of breath  Protocols used: Leg Swelling and Edema-A-AH

## 2023-09-23 NOTE — ED Triage Notes (Signed)
Pt sent here by physical therapist due to swelling in legs and increased BP  BP ws 180/76 , does not take BP meds  States swelling noticed 2-3 days ago  Has gained 20 lbs of fluid in past 3 days   +3 pitting edema bilaterally   Currently on antibiotics for pneumonia

## 2023-09-23 NOTE — Telephone Encounter (Signed)
Chief Complaint:  Patient  presents with abdomen swelling  weight gain 167-197 within one week. Patient Abdomen is moderately swollen  . Bilateral both legs are swollen  knee down and Denies any pain. Blood pressure currently 190/87 Symptoms:    Swelling  in abdomen and bil legs. Increased BP 187/90 Frequency:  x 2 days Pertinent Negatives: Patient denies  Shortness of breath  Disposition: [x] ED /[] Urgent Care (no appt availability in office) / [] Appointment(In office/virtual)/ []  [place] [facility]/ [] Home Care/ [] Refused Recommended Disposition /[] [place] [facility]/ []  Follow-up with PCP Additional Notes:  Sent to ED now.    Reason for Disposition  [1] Systolic BP  >= 160 OR Diastolic >= 100 AND [2] cardiac (e.g., breathing difficulty, chest pain) or neurologic symptoms (e.g., new-onset blurred or double vision, unsteady gait)  Answer Assessment - Initial Assessment Questions 1. SYMPTOM: "What's the main symptom you're concerned about?" (e.g., abdomen bloating, swelling)      Abdomen Swelling , bilateral swelling in both legs knee down and increased blood pressure. 2. ONSET: "When did  swelling start?"     Two days ago  DC from hospital on  last Friday 09/16/2023 3. SEVERITY: "How bad is the bloating or swelling?"    Abdomen looks mildly distended or swollen.    - MODERATE - SEVERE SWELLING: Abdomen looks very distended or swollen.       4. ABDOMEN PAIN:  "Is there any abdomen pain?" If Yes, ask: "How bad is the pain?"  (e.g., Scale 1-10; mild, moderate, or severe)   - NONE (0): No pain.   5. RELIEVING AND AGGRAVATING FACTORS: "What makes it better or worse?" (e.g., certain foods, lactose, medicines)      Not sure never had this to happen before  6. GI HISTORY: "Do you have any history of stomach or intestine problems?" (e.g., bowel obstruction, cancer, irritable bowel)       Denies  only recently had pneumonia. 7. CAUSE: "What do you think is causing the bloating?"        Unsure  8. OTHER SYMPTOMS: "Do you have any other symptoms?" (e.g., belching, blood in stool, breathing difficulty, constipation, diarrhea, fever, passing gas, vomiting, weight loss, white of eyes have turned yellow)      Denies. Only other symptoms is Abdomen Swelling, Edema Bilateral knee down and increased blood pressure.  Answer Assessment - Initial Assessment Questions 1. ONSET: "When did the swelling start?" (e.g., minutes, hours, days)      Two days 2. LOCATION: "What part of the leg is swollen?"  "Are both legs swollen or just one leg?"     both 3. SEVERITY: "How bad is the swelling?" (e.g., localized; mild, moderate, severe)     - MODERATE edema: Swelling of lower leg to knee, pitting edema > 1/4 inch (6 mm) deep, rest and elevation only partially reduce swelling.       Answer Assessment - Initial Assessment Questions 1. BLOOD PRESSURE: "What is the blood pressure?" "Did you take at least two measurements 5 minutes apart?"     187 over  90  2. ONSET: "When did you take your blood pressure?"      Home health is present and took it manually 3. HOW: "How did you take your blood pressure?" (e.g., automatic home BP monitor, visiting nurse)      Visiting nurse 4. HISTORY: "Do you have a history of high blood pressure?"     Denies 5. MEDICINES: "Are you taking any medicines  for blood pressure?" "Have you missed any doses recently?"      Denies 6. OTHER SYMPTOMS: "Do you have any symptoms?" (e.g., blurred vision, chest pain, difficulty breathing, headache, weakness)      Abdomen swelling and bilateral legs swelling knee down  Protocols used: Abdomen Bloating and Swelling-A-AH, Leg Swelling and Edema-A-AH, Blood Pressure - High-A-AH

## 2023-09-25 ENCOUNTER — Emergency Department (HOSPITAL_BASED_OUTPATIENT_CLINIC_OR_DEPARTMENT_OTHER): Payer: Non-veteran care

## 2023-09-25 ENCOUNTER — Emergency Department (HOSPITAL_BASED_OUTPATIENT_CLINIC_OR_DEPARTMENT_OTHER)
Admission: EM | Admit: 2023-09-25 | Discharge: 2023-09-25 | Disposition: A | Discharge status: Home / Self Care | Payer: Non-veteran care | Attending: Emergency Medicine | Admitting: Emergency Medicine

## 2023-09-25 ENCOUNTER — Other Ambulatory Visit: Payer: Self-pay

## 2023-09-25 ENCOUNTER — Encounter (HOSPITAL_BASED_OUTPATIENT_CLINIC_OR_DEPARTMENT_OTHER): Payer: Self-pay | Admitting: Emergency Medicine

## 2023-09-25 DIAGNOSIS — R600 Localized edema: Secondary | ICD-10-CM | POA: Insufficient documentation

## 2023-09-25 LAB — CBC WITH DIFFERENTIAL/PLATELET
Abs Immature Granulocytes: 0.02 10*3/uL (ref 0.00–0.07)
Basophils Absolute: 0.1 10*3/uL (ref 0.0–0.1)
Basophils Relative: 1 %
Eosinophils Absolute: 0.4 10*3/uL (ref 0.0–0.5)
Eosinophils Relative: 5 %
HCT: 24.8 % — ABNORMAL LOW (ref 39.0–52.0)
Hemoglobin: 8.1 g/dL — ABNORMAL LOW (ref 13.0–17.0)
Immature Granulocytes: 0 %
Lymphocytes Relative: 35 %
Lymphs Abs: 2.3 10*3/uL (ref 0.7–4.0)
MCH: 26 pg (ref 26.0–34.0)
MCHC: 32.7 g/dL (ref 30.0–36.0)
MCV: 79.5 fL — ABNORMAL LOW (ref 80.0–100.0)
Monocytes Absolute: 0.6 10*3/uL (ref 0.1–1.0)
Monocytes Relative: 9 %
Neutro Abs: 3.3 10*3/uL (ref 1.7–7.7)
Neutrophils Relative %: 50 %
Platelets: 105 10*3/uL — ABNORMAL LOW (ref 150–400)
RBC: 3.12 MIL/uL — ABNORMAL LOW (ref 4.22–5.81)
RDW: 23.1 % — ABNORMAL HIGH (ref 11.5–15.5)
WBC: 6.6 10*3/uL (ref 4.0–10.5)
nRBC: 0 % (ref 0.0–0.2)

## 2023-09-25 LAB — COMPREHENSIVE METABOLIC PANEL
ALT: 31 U/L (ref 0–44)
AST: 105 U/L — ABNORMAL HIGH (ref 15–41)
Albumin: 2.3 g/dL — ABNORMAL LOW (ref 3.5–5.0)
Alkaline Phosphatase: 84 U/L (ref 38–126)
Anion gap: 6 (ref 5–15)
BUN: 7 mg/dL (ref 6–20)
CO2: 24 mmol/L (ref 22–32)
Calcium: 7.8 mg/dL — ABNORMAL LOW (ref 8.9–10.3)
Chloride: 106 mmol/L (ref 98–111)
Creatinine, Ser: 0.76 mg/dL (ref 0.61–1.24)
GFR, Estimated: >60 mL/min (ref >60)
Glucose, Bld: 106 mg/dL — ABNORMAL HIGH (ref 70–99)
Potassium: 3.8 mmol/L (ref 3.5–5.1)
Sodium: 136 mmol/L (ref 135–145)
Total Bilirubin: 3.4 mg/dL — ABNORMAL HIGH (ref 0.0–1.2)
Total Protein: 6.1 g/dL — ABNORMAL LOW (ref 6.5–8.1)

## 2023-09-25 LAB — BRAIN NATRIURETIC PEPTIDE: B Natriuretic Peptide: 168.7 pg/mL — ABNORMAL HIGH (ref 0.0–100.0)

## 2023-09-25 MED ORDER — FUROSEMIDE 20 MG PO TABS
20.0000 mg | ORAL_TABLET | Freq: Every day | ORAL | 1 refills | Status: DC
Start: 2023-09-25 — End: 2023-10-03
  Filled 2023-09-25: qty 14, 14d supply, fill #0

## 2023-09-25 NOTE — ED Notes (Signed)
 ED Provider at bedside.

## 2023-09-25 NOTE — ED Triage Notes (Signed)
Pt returns for continued leg swelling; was seen here Fri, but LWBS; significant pitting edema to BLE; reports about 20lb weight gain

## 2023-09-25 NOTE — ED Provider Notes (Signed)
[place] [facility] AT [facility] Provider Note   CSN: [id number] Arrival date & time: 09/25/23  1922     History  Chief Complaint  Patient presents with   Leg Swelling    Juan [name] is a 58 y.o. male.  Patient recently discharged from the hospital was admitted January 19 discharged home on January 24.  Patient had a febrile illness presumed community-acquired pneumonia Enterococcus faecalis bacteremia.  Did have 2D echo done that did not show any vegetations.  Infectious disease was involved and they recommended oral amoxicillin until February 3.  Patient following up with his primary care doctor which is at [facility] and with infectious disease.  Patient is also followed in the past by cardiology.  Patient known to have aortic stenosis.  Patient during hospitalization acute metabolic acidosis.  Patient had hyponatremia.  Patient hypoalbuminemia they thought that was secondary cirrhosis of the liver.  Encouraged protein oral intake.  Patient since discharge has had significant swelling in the legs also seems a little puffy in his upper extremities and may be in his abdomen.  But no worsening shortness of breath no further fevers.  Past medical history in for hypertension GERD esophagitis anemia alcohol use alcohol myopathy alcoholic cirrhosis anxiety bipolar disorder posttraumatic stress disorder depression GI bleed QTc prolongation.  And patient presented with fever for 3 days prior to that admission.  Respiratory panel was negative at that time.  Patient did come here on Friday but left without being seen.  Patient states she has had bilateral lower extremity swelling has been significant in 20 pound weight gain.  No fevers no shortness of breath no chest pain.       Home Medications Prior to Admission medications   Medication Sig Start Date End Date Taking? Authorizing Provider  amoxicillin (AMOXIL) 500 MG capsule Take 2 capsules (1,000 mg  total) by mouth 3 (three) times daily for 12 days. 09/15/23 09/28/23  Gardiner Barefoot, MD  busPIRone (BUSPAR) 15 MG tablet Take 1 tablet (15 mg total) by mouth 2 (two) times daily. Patient not taking: Reported on 09/11/2023 11/18/22   Everrett Coombe, DO  diclofenac Sodium (VOLTAREN) 1 % GEL Apply 4 grams topically 4 (four) times daily to affected joint. Patient not taking: Reported on 09/11/2023 02/08/23   Monica Becton, MD  lactulose, encephalopathy, (CHRONULAC) 10 GM/15ML SOLN Take 10 g by mouth daily as needed (constipation). Patient not taking: Reported on 09/11/2023    [provider]  magnesium oxide (MAG-OX) 400 MG tablet Take 1 tablet (400 mg total) by mouth 2 (two) times daily. 05/13/23   Everrett Coombe, DO  nicotine (NICODERM CQ - DOSED IN MG/24 HOURS) 21 mg/24hr patch Apply patch to non-hairy, clean, dry skin on upper body between the neck and waist. Remove the previous day's patch before applying new patch. Rotate or switch site where apply daily. If you have intense, clear, troubling dreams or your cannot fall asleep only wear patch 16 hrs per day with removal at bedtime. Patient not taking: Reported on 09/11/2023 06/03/23     ondansetron (ZOFRAN-ODT) 8 MG disintegrating tablet Dissolve 1 tablet (8 mg total) by mouth every 8 (eight) hours as needed for nausea. Patient not taking: Reported on 09/11/2023 01/20/23   Rai, Delene Ruffini, MD  pantoprazole (PROTONIX) 40 MG tablet Take 1 tablet (40 mg total) by mouth daily. Patient not taking: Reported on 09/11/2023 11/18/22 11/18/23  Imogene Burn, MD  QUEtiapine (SEROQUEL) 50 MG tablet Take  0.5-1 tablets (25-50 mg total) by mouth at bedtime. 09/20/23   Everrett Coombe, DO  sodium bicarbonate 650 MG tablet Take 1 tablet (650 mg total) by mouth 3 (three) times daily. 09/16/23   Glade Lloyd, MD  vortioxetine HBr (TRINTELLIX) 10 MG TABS tablet Take 1 tablet (10 mg total) by mouth daily. Patient not taking: Reported on 09/11/2023 11/22/22    Everrett Coombe, DO      Allergies    Apple juice, Cucumber extract, Depakote er [divalproex sodium er], Depakote [valproic acid], Peanut butter flavoring agent (non-screening), Peanut oil, Peanut-containing drug products, Shellfish allergy, Shrimp extract, Strawberry extract, Apple, Cantaloupe extract allergy skin test, Codeine, Firvanq [vancomycin], and Lactose intolerance (gi)    Review of Systems   Review of Systems  Constitutional:  Positive for unexpected weight change. Negative for chills and fever.  HENT:  Negative for ear pain and sore throat.   Eyes:  Negative for pain and visual disturbance.  Respiratory:  Negative for cough and shortness of breath.   Cardiovascular:  Positive for leg swelling. Negative for chest pain and palpitations.  Gastrointestinal:  Negative for abdominal pain and vomiting.  Genitourinary:  Negative for dysuria and hematuria.  Musculoskeletal:  Negative for arthralgias and back pain.  Skin:  Negative for color change and rash.  Neurological:  Negative for seizures and syncope.  All other systems reviewed and are negative.   Physical Exam Updated Vital Signs BP 131/89 (BP Location: Left Arm)   Pulse 84   Temp 98.2 F (36.8 C) (Oral)   Resp 20   Ht 1.651 m (5\' 5" )   Wt 90.7 kg   SpO2 100%   BMI 33.28 kg/m  Physical Exam Vitals and nursing note reviewed.  Constitutional:      General: He is not in acute distress.    Appearance: Normal appearance. He is well-developed. He is not ill-appearing.  HENT:     Head: Normocephalic and atraumatic.  Eyes:     Extraocular Movements: Extraocular movements intact.     Conjunctiva/sclera: Conjunctivae normal.     Pupils: Pupils are equal, round, and reactive to light.  Cardiovascular:     Rate and Rhythm: Normal rate and regular rhythm.     Heart sounds: No murmur heard. Pulmonary:     Effort: Pulmonary effort is normal. No respiratory distress.     Breath sounds: Rhonchi present. No wheezing or rales.   Abdominal:     Palpations: Abdomen is soft.     Tenderness: There is no abdominal tenderness.  Musculoskeletal:        General: No swelling.     Cervical back: Normal range of motion and neck supple.     Right lower leg: Edema present.     Left lower leg: Edema present.     Comments: Marked bilateral lower extremity edema.  Little bit of puffiness to his hands and his upper extremity.  Skin:    General: Skin is warm and dry.     Capillary Refill: Capillary refill takes less than 2 seconds.  Neurological:     General: No focal deficit present.     Mental Status: He is alert and oriented to person, place, and time.  Psychiatric:        Mood and Affect: Mood normal.     ED Results / Procedures / Treatments   Labs (all labs ordered are listed, but only abnormal results are displayed) Labs Reviewed  COMPREHENSIVE METABOLIC PANEL - Abnormal; Notable for the following components:  Result Value   Glucose, Bld 106 (*)    Calcium 7.8 (*)    Total Protein 6.1 (*)    Albumin 2.3 (*)    AST 105 (*)    Total Bilirubin 3.4 (*)    All other components within normal limits  BRAIN NATRIURETIC PEPTIDE - Abnormal; Notable for the following components:   B Natriuretic Peptide 168.7 (*)    All other components within normal limits  CBC WITH DIFFERENTIAL/PLATELET - Abnormal; Notable for the following components:   RBC 3.12 (*)    Hemoglobin 8.1 (*)    HCT 24.8 (*)    MCV 79.5 (*)    RDW 23.1 (*)    Platelets 105 (*)    All other components within normal limits  CBC WITH DIFFERENTIAL/PLATELET    EKG None  Radiology No results found.  Procedures Procedures    Medications Ordered in ED Medications - No data to display  ED Course/ Medical Decision Making/ A&P                                 Medical Decision Making Amount and/or Complexity of Data Reviewed Labs: ordered. Radiology: ordered.   Will get portable chest x-ray here just to make sure the lungs are clear her  oxygen saturations are very reassuring at 97% on room air while I was in there.  Out in triage he was 100% on room air.  BNP today is 168 on Friday it was 102.  CBC white count 6.6 hemoglobin 8.1 no significant change there platelets 105.  Complete metabolic panel significant for total bili of 3.4 which is a little bit improved.  Sodium today 136 much better.  Glucose 106 albumin still low at 2.3 renal function GFR is greater than 60 anion gap is 6.  All we need his chest x-ray.  Feel that this is some whole-body edema particular in lower extremities most likely secondary to the albumin.  But with normal renal function if chest x-ray looks good certainly could be started on some low-dose Lasix.  Patient's sodium currently is not too bad.  But it will have to be followed up carefully he has follow-up with his primary care doctor on Tuesday so they can probably recheck it then.  Needs to follow back up with cardiology and needs to follow-up with infectious disease.  As mentioned above no further fevers still taking the antibiotic.  Chest x-ray has no acute cardiopulmonary disease.  No evidence of any pulmonary edema.  Based on this we will treat with low-dose Lasix have him follow-up with his primary care doctor.   Final Clinical Impression(s) / ED Diagnoses Final diagnoses:  Bilateral lower extremity edema    Rx / DC Orders ED Discharge Orders     None         Vanetta Mulders, MD 09/25/23 2313

## 2023-09-25 NOTE — ED Notes (Signed)
Attempt x 1 to obtain labs by Mauro Kaufmann, NT

## 2023-09-25 NOTE — Discharge Instructions (Signed)
Take the Lasix as directed.  Have your primary care doctor recheck your basic chemistries to make sure they are not getting out of whack from the Lasix.  Follow-up with your primary care doctor as scheduled for Tuesday.  Also make an appointment follow-up with cardiology.

## 2023-09-26 ENCOUNTER — Other Ambulatory Visit (HOSPITAL_BASED_OUTPATIENT_CLINIC_OR_DEPARTMENT_OTHER): Payer: Self-pay

## 2023-09-26 ENCOUNTER — Other Ambulatory Visit: Payer: Self-pay

## 2023-09-27 ENCOUNTER — Ambulatory Visit: Payer: Commercial Managed Care - PPO | Admitting: Sports Medicine

## 2023-09-27 ENCOUNTER — Encounter: Payer: Self-pay | Admitting: Family Medicine

## 2023-09-27 ENCOUNTER — Ambulatory Visit (INDEPENDENT_AMBULATORY_CARE_PROVIDER_SITE_OTHER): Payer: Commercial Managed Care - PPO | Admitting: Family Medicine

## 2023-09-27 ENCOUNTER — Other Ambulatory Visit (HOSPITAL_BASED_OUTPATIENT_CLINIC_OR_DEPARTMENT_OTHER): Payer: Self-pay

## 2023-09-27 VITALS — BP 134/73 | HR 89 | Ht 65.0 in | Wt 198.0 lb

## 2023-09-27 DIAGNOSIS — R601 Generalized edema: Secondary | ICD-10-CM | POA: Diagnosis not present

## 2023-09-27 DIAGNOSIS — E8809 Other disorders of plasma-protein metabolism, not elsewhere classified: Secondary | ICD-10-CM | POA: Diagnosis not present

## 2023-09-27 DIAGNOSIS — K7031 Alcoholic cirrhosis of liver with ascites: Secondary | ICD-10-CM | POA: Diagnosis not present

## 2023-09-27 MED ORDER — VORTIOXETINE HBR 10 MG PO TABS
10.0000 mg | ORAL_TABLET | Freq: Every day | ORAL | 3 refills | Status: DC
  Filled 2023-09-27: qty 30, 30d supply, fill #0

## 2023-09-27 NOTE — Assessment & Plan Note (Signed)
Recommend continue to keep legs elevated, use compression stockings.  Increase protein intake.  Referral placed for dietitian consultation.

## 2023-09-27 NOTE — Progress Notes (Signed)
 Juan [name] - 58 y.o. male MRN [id number]  Date of birth: 16-Mar-1966  Subjective Chief Complaint  Patient presents with   Hospitalization Follow-up         HPI Juan [name] is a 58 y.o. male here today for follow-up of recent ED visit.  He was seen in the ED due to increased swelling in his legs.  This has been increasing over the past couple weeks but significantly worsened over the last week.  Swelling is somewhat painful.  He denies any weeping from the legs.  He is not currently using compression stockings.  His albumin levels have been quite low and it was felt that this was the cause for his swelling.  He does drink a protein shake each day but reports that he mostly has Goldfish crackers.  He denies increased shortness of breath or abdominal swelling or distention.  Low-dose furosemide  was added.    ROS:  A comprehensive ROS was completed and negative except as noted per HPI  Allergies  Allergen Reactions   Apple Juice Swelling and Other (See Comments)    Tongue swelling   Cucumber Extract Itching and Nausea And Vomiting    No extracts; just cucumber   Depakote Er [Divalproex Sodium Er] Swelling and Other (See Comments)    Tongue swelling   Depakote [Valproic Acid] Anaphylaxis   Peanut Butter Flavoring Agent (Non-Screening) Anaphylaxis and Swelling   Peanut Oil Swelling   Peanut-Containing Drug Products Swelling   Shellfish Allergy Itching and Swelling   Shrimp Extract Itching and Swelling   Strawberry Extract Nausea And Vomiting and Swelling   Apple Swelling   Cantaloupe Extract Allergy Skin Test Rash   Codeine Itching, Rash and Other (See Comments)    Patient reports he can take CODONES without problems   Firvanq  [Vancomycin ] Rash   Lactose Intolerance (Gi) Diarrhea and Other (See Comments)    Indigestion, Stomach pain, Flatulence    Past Medical History:  Diagnosis Date   Alcohol abuse 01/11/2022   Alcohol addiction (HCC)    Anxiety    Bleeding internal  hemorrhoids 08/18/2022   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   Chronic fatigue    Cirrhosis (HCC)    Colon polyps    Depression    GERD (gastroesophageal reflux disease)    Hemochromatosis    Hiatal hernia 02/20/2022   Hx of blood clots    Leg   Hyperreflexia    Hypertension    PTSD (post-traumatic stress disorder)    Traumatic hemorrhagic shock [facility])     Past Surgical History:  Procedure Laterality Date   BIOPSY  09/24/2021   Procedure: BIOPSY;  Surgeon: Federico Rosario BROCKS, MD;  Location: [facility] ENDOSCOPY;  Service: Gastroenterology;;   ORIN MEDIATE RELEASE Bilateral    COLONOSCOPY WITH PROPOFOL  N/A 09/24/2021   Procedure: COLONOSCOPY WITH PROPOFOL ;  Surgeon: Federico Rosario BROCKS, MD;  Location: [facility] ENDOSCOPY;  Service: Gastroenterology;  Laterality: N/A;   ESOPHAGOGASTRODUODENOSCOPY (EGD) WITH PROPOFOL  N/A 09/24/2021   Procedure: ESOPHAGOGASTRODUODENOSCOPY (EGD) WITH PROPOFOL ;  Surgeon: Federico Rosario BROCKS, MD;  Location: [facility] ENDOSCOPY;  Service: Gastroenterology;  Laterality: N/A;   ESOPHAGOGASTRODUODENOSCOPY (EGD) WITH PROPOFOL  N/A 08/18/2022   Procedure: ESOPHAGOGASTRODUODENOSCOPY (EGD) WITH PROPOFOL ;  Surgeon: Abran Norleen SAILOR, MD;  Location: [facility] ENDOSCOPY;  Service: Gastroenterology;  Laterality: N/A;   FLEXIBLE SIGMOIDOSCOPY N/A 08/18/2022   Procedure: FLEXIBLE SIGMOIDOSCOPY;  Surgeon: Abran Norleen SAILOR, MD;  Location: [facility] ENDOSCOPY;  Service: Gastroenterology;  Laterality: N/A;   FRACTURE SURGERY     left ankle  plate   HERNIA REPAIR     inguinal   KNEE SURGERY Right    x 4   SHOULDER SURGERY Bilateral    x 2   VASECTOMY      Social History   Socioeconomic History   Marital status: Married    Spouse name: [name]   Number of children: 2   Years of education: Not on file   Highest education level: 12th grade  Occupational History    Comment: disability  Tobacco Use   Smoking status: Never   Smokeless tobacco: Current    Types: Snuff  Vaping Use   Vaping status: Never Used   Substance and Sexual Activity   Alcohol use: Not Currently   Drug use: Never   Sexual activity: Yes    Partners: Female  Other Topics Concern   Not on file  Social History Narrative   Lives with wife   Social Drivers of Health   Financial Resource Strain: Medium Risk (01/09/2023)   Overall Financial Resource Strain (CARDIA)    Difficulty of Paying Living Expenses: Somewhat hard  Food Insecurity: No Food Insecurity (09/11/2023)   Hunger Vital Sign    Worried About Running Out of Food in the Last Year: Never true    Ran Out of Food in the Last Year: Never true  Transportation Needs: No Transportation Needs (09/11/2023)   PRAPARE - Administrator, Civil Service (Medical): No    Lack of Transportation (Non-Medical): No  Physical Activity: Insufficiently Active (01/09/2023)   Exercise Vital Sign    Days of Exercise per Week: 1 day    Minutes of Exercise per Session: 10 min  Stress: Stress Concern Present (01/09/2023)   [employer] of Occupational Health - Occupational Stress Questionnaire    Feeling of Stress : To some extent  Social Connections: Patient Declined (09/11/2023)   Social Connection and Isolation Panel [NHANES]    Frequency of Communication with Friends and Family: Patient declined    Frequency of Social Gatherings with Friends and Family: Patient declined    Attends Religious Services: Patient declined    Database Administrator or Organizations: Patient declined    Attends Engineer, Structural: Patient declined    Marital Status: Patient declined    Family History  Problem Relation Age of Onset   Pulmonary fibrosis Mother    Hypertension Father    Other Father        liver failure   Diabetes Brother    Breast cancer Paternal Aunt    Colon cancer Neg Hx    Esophageal cancer Neg Hx    Stomach cancer Neg Hx    Rectal cancer Neg Hx     Health Maintenance  Topic Date Due   COVID-19 Vaccine (4 - 2024-25 season) 04/24/2023   INFLUENZA  VACCINE  11/21/2023 (Originally 03/24/2023)   DTaP/Tdap/Td (3 - Td or Tdap) 06/20/2029   Colonoscopy  09/25/2031   Pneumococcal Vaccine 98-48 Years old  Completed   Hepatitis C Screening  Completed   HIV Screening  Completed   Zoster Vaccines- Shingrix  Completed   HPV VACCINES  Aged Out     ----------------------------------------------------------------------------------------------------------------------------------------------------------------------------------------------------------------- Physical Exam BP 134/73   Pulse 89   Ht 5' 5 (1.651 m)   Wt 198 lb (89.8 kg)   SpO2 99%   BMI 32.95 kg/m   Physical Exam Constitutional:      Appearance: Normal appearance.  HENT:     Head: Normocephalic and atraumatic.  Cardiovascular:     Rate and Rhythm: Normal rate and regular rhythm.  Pulmonary:     Effort: Pulmonary effort is normal.     Breath sounds: Normal breath sounds.  Abdominal:     General: There is no distension.     Palpations: Abdomen is soft.     Tenderness: There is no abdominal tenderness.  Musculoskeletal:     Comments: Bilateral lower extremity pitting edema.  Neurological:     Mental Status: He is alert.     ------------------------------------------------------------------------------------------------------------------------------------------------------------------------------------------------------------------- Assessment and Plan  Anasarca Recommend continue to keep legs elevated, use compression stockings.  Increase protein intake.  Referral placed for dietitian consultation.   Meds ordered this encounter  Medications   vortioxetine  HBr (TRINTELLIX ) 10 MG TABS tablet    Sig: Take 1 tablet (10 mg total) by mouth daily.    Dispense:  30 tablet    Refill:  3    No follow-ups on file.    This visit occurred during the SARS-CoV-2 public health emergency.  Safety protocols were in place, including screening questions prior to the visit,  additional usage of staff PPE, and extensive cleaning of exam room while observing appropriate contact time as indicated for disinfecting solutions.

## 2023-09-27 NOTE — Patient Instructions (Addendum)
I have placed a referral to dietician to discuss strategies to meet increased protein requirements.  Limit sodium intake-  2000mg /day

## 2023-09-28 ENCOUNTER — Telehealth: Payer: Self-pay

## 2023-09-28 ENCOUNTER — Telehealth: Payer: Commercial Managed Care - PPO | Admitting: Family Medicine

## 2023-09-28 ENCOUNTER — Ambulatory Visit: Payer: Self-pay | Admitting: Family Medicine

## 2023-09-28 ENCOUNTER — Other Ambulatory Visit (HOSPITAL_BASED_OUTPATIENT_CLINIC_OR_DEPARTMENT_OTHER): Payer: Self-pay

## 2023-09-28 DIAGNOSIS — R600 Localized edema: Secondary | ICD-10-CM | POA: Diagnosis not present

## 2023-09-28 DIAGNOSIS — L299 Pruritus, unspecified: Secondary | ICD-10-CM

## 2023-09-28 MED ORDER — TRAMADOL HCL 50 MG PO TABS
50.0000 mg | ORAL_TABLET | Freq: Three times a day (TID) | ORAL | 0 refills | Status: AC | PRN
  Filled 2023-09-28: qty 15, 5d supply, fill #0

## 2023-09-28 MED ORDER — TRIAMCINOLONE ACETONIDE 0.1 % EX CREA
1.0000 | TOPICAL_CREAM | Freq: Two times a day (BID) | CUTANEOUS | 0 refills | Status: DC
  Filled 2023-09-28: qty 80, 30d supply, fill #0

## 2023-09-28 NOTE — Telephone Encounter (Signed)
 Copied from CRM [id number]. Topic: General - Other >> Sep 28, 2023  2:09 PM Juan [name] wrote: Reason for CRM: Patient calling in again to request update on pain medicine request from yesterday. Patient requesting a call with update when it has been made.

## 2023-09-28 NOTE — Telephone Encounter (Signed)
 Patient saw PCP on 09/27/23 for hospital follow up. Patient states he needs topical for itching and was told his PCP was not in office today. Patient was schedule for a video visit with a provider [facility] in [place]. Does patient need another visit or can something be called into pharmacy?

## 2023-09-28 NOTE — Telephone Encounter (Signed)
 Copied from CRM [id number]. Topic: Clinical - Medication Question >> Sep 27, 2023  2:14 PM Joesph PARAS wrote: Reason for CRM: Patient is calling to inquire if there is something PCP would be willing to prescribe to assist with the pain and in thing associated with recent fluid intake in leg. Patient seen today and would like it sent into [facility] on file if PCP recommends anything in particular.

## 2023-09-28 NOTE — Telephone Encounter (Signed)
 Copied from CRM [id number]. Topic: Clinical - Red Word Triage >> Sep 28, 2023  3:35 PM Joesph PARAS wrote: Red Word that prompted transfer to Nurse Triage: Patient wants to speak to a nurse and wants to speak now about a medication.   Chief Complaint: Legs fluid retention Symptoms: Itching Frequency: Consistent Disposition: [facility] /[] Urgent Care (no appt availability in office) / [x] Appointment(In office/virtual)/ []  [facility]/ [] Home Care/ [] Refused Recommended Disposition /[] [facility]/ []  Follow-up with PCP Additional Notes: Pt reports continued itching, pain, and swelling in bilateral legs. Pt made virtual ov appt.   Reason for Disposition  [1] SEVERE pain (e.g., excruciating, unable to walk) AND [2] not improved after 2 hours of pain medicine  Answer Assessment - Initial Assessment Questions 1. LOCATION: Which ankle is swollen? Where is the swelling?     Both legs knee down 2. SWELLING: How bad is the swelling? Or, How large is it? (e.g., mild, moderate, severe; size of localized swelling)    - NONE: No joint swelling.   - LOCALIZED: Localized; small area of puffy or swollen skin (e.g., insect bite, skin irritation).   - MILD: Joint looks or feels mildly swollen or puffy.   - MODERATE: Swollen; interferes with normal activities (e.g., work or school); decreased range of movement; may be limping.   - SEVERE: Very swollen; can't move swollen joint at all; limping a lot or unable to walk.     Pain not worse since appt 4. PAIN: Is there any pain? If Yes, ask: How bad is it? (Scale 1-10; or mild, moderate, severe)   - NONE (0): no pain.   - MILD (1-3): doesn't interfere with normal activities.    - MODERATE (4-7): interferes with normal activities (e.g., work or school) or awakens from sleep, limping.    - SEVERE (8-10): excruciating pain, unable to do any normal activities, unable to walk.      9  Protocols used: Ankle Swelling-A-AH

## 2023-09-28 NOTE — Progress Notes (Signed)
 Virtual Visit via Video Note  I connected with Juan [name] on 09/28/2023 at 4:21PM by a video enabled telemedicine application and verified that I am speaking with the correct person using two identifiers.  Patient Location: Home Provider Location: Office/Clinic  I discussed the limitations, risks, security, and privacy concerns of performing an evaluation and management service by video and the availability of in person appointments. I also discussed with the patient that there may be a patient responsible charge related to this service. The patient expressed understanding and agreed to proceed.  Subjective: PCP: Alvia Bring, DO  Patient was seen in the ED on 09/25/2023 for bilateral lower extremity edema.  He was hospitalized 1/19 - 1/24 for CAP complicated with acute metabolic acidosis, hyponatremia, and hypoalbuminemia secondary to cirrhosis of liver. History of etoh abuse. At this time, patient reports that he has gained 20 to 30 pounds of fluid in his legs, this is never happened before.  There was no evidence of acute cardiopulmonary disease and no evidence of pulmonary edema.  He was started on low-dose Lasix .  Patient had a hospital follow-up with his PCP Dr. Alvia on 2/4.  Review of chart shows the discussed keeping legs elevated, using compression stockings, increasing protein intake and referral was placed for dietitian.  Today, he reports the swelling in his lower legs is causing pain and he is experiencing itching that is keeping him up at night.  He reports that PT and OT have been coming to his house to work with him.  He is able to ambulate.  Denies warmth, erythema, streaking, open wounds, shortness of breath, acute leg pain, chest pain. He does note that his legs feel tight and have a throbbing sensation.   ROS: Per HPI  Current Outpatient Medications:    triamcinolone  cream (KENALOG ) 0.1 %, Apply 1 Application topically 2 (two) times daily., Disp: 80 g, Rfl: 0    busPIRone  (BUSPAR ) 15 MG tablet, Take 1 tablet (15 mg total) by mouth 2 (two) times daily., Disp: 180 tablet, Rfl: 2   diclofenac  Sodium (VOLTAREN ) 1 % GEL, Apply 4 grams topically 4 (four) times daily to affected joint., Disp: 100 g, Rfl: 11   furosemide  (LASIX ) 20 MG tablet, Take 1 tablet (20 mg total) by mouth daily., Disp: 14 tablet, Rfl: 1   lactulose , encephalopathy, (CHRONULAC ) 10 GM/15ML SOLN, Take 10 g by mouth daily as needed (constipation)., Disp: , Rfl:    magnesium  oxide (MAG-OX) 400 MG tablet, Take 1 tablet (400 mg total) by mouth 2 (two) times daily., Disp: 120 tablet, Rfl: 2   nicotine  (NICODERM CQ  - DOSED IN MG/24 HOURS) 21 mg/24hr patch, Apply patch to non-hairy, clean, dry skin on upper body between the neck and waist. Remove the previous day's patch before applying new patch. Rotate or switch site where apply daily. If you have intense, clear, troubling dreams or your cannot fall asleep only wear patch 16 hrs per day with removal at bedtime., Disp: 56 patch, Rfl: 0   ondansetron  (ZOFRAN -ODT) 8 MG disintegrating tablet, Dissolve 1 tablet (8 mg total) by mouth every 8 (eight) hours as needed for nausea., Disp: 30 tablet, Rfl: 1   pantoprazole  (PROTONIX ) 40 MG tablet, Take 1 tablet (40 mg total) by mouth daily., Disp: 30 tablet, Rfl: 1   QUEtiapine  (SEROQUEL ) 50 MG tablet, Take 0.5-1 tablets (25-50 mg total) by mouth at bedtime., Disp: 90 tablet, Rfl: 0   sodium bicarbonate  650 MG tablet, Take 1 tablet (650 mg total) by  mouth 3 (three) times daily., Disp: 90 tablet, Rfl: 0   traMADol  (ULTRAM ) 50 MG tablet, Take 1 tablet (50 mg total) by mouth every 8 (eight) hours as needed for up to 5 days., Disp: 15 tablet, Rfl: 0   vortioxetine  HBr (TRINTELLIX ) 10 MG TABS tablet, Take 1 tablet (10 mg total) by mouth daily., Disp: 30 tablet, Rfl: 3  Observations/Objective: There were no vitals filed for this visit.  General: Alert and oriented x 4. Speaking in clear and full sentences, no audible  heavy breathing, no acute distress.  Sounds alert and appropriately interactive.  Appears well.  Face symmetric.  Extraocular movements intact.  Pupils equal and round.  No nasal flaring or accessory muscle use visualized.  Assessment and Plan:  1. Pruritus (Primary) Patient presents today with ongoing significant bilateral lower extremity edema and reports he is now experiencing lower extremity pruritus and throbbing discomfort.  Due to visit being virtual, advised patient that my physical exam is limited.  Currently denies chest pain, shortness of breath, decreased urine output, abdominal pain, acute sharp leg pain redness and warmth of the lower extremities.  Patient is well-appearing and is not in any acute distress.  He is able to speak in full sentences with no audible heavy breathing.  Advised patient to use topical Voltaren  gel for pain relief.  Due to symptoms sounding similar to inflammatory skin disease, will prescribe topical corticosteroid.  Advised patient to reach out if treatment does not help with the symptoms.  Advised patient to follow-up with PCP. - triamcinolone  cream (KENALOG ) 0.1 %; Apply 1 Application topically 2 (two) times daily.  Dispense: 80 g; Refill: 0  2. Bilateral lower extremity edema See #1 - triamcinolone  cream (KENALOG ) 0.1 %; Apply 1 Application topically 2 (two) times daily.  Dispense: 80 g; Refill: 0   Follow Up Instructions: Return if symptoms worsen or fail to improve.   I discussed the assessment and treatment plan with the patient. The patient was provided an opportunity to ask questions, and all were answered. The patient agreed with the plan and demonstrated an understanding of the instructions.   The patient was advised to call back or seek an in-person evaluation if the symptoms worsen or if the condition fails to improve as anticipated.  The above assessment and management plan was discussed with the patient. The patient verbalized understanding of  and has agreed to the management plan.   Evalene Arts, FNP

## 2023-09-29 NOTE — Telephone Encounter (Signed)
 Patient seen in office by provider on 09/27/23.

## 2023-09-29 NOTE — Telephone Encounter (Signed)
 Patient informed.

## 2023-10-03 ENCOUNTER — Ambulatory Visit (INDEPENDENT_AMBULATORY_CARE_PROVIDER_SITE_OTHER): Payer: Commercial Managed Care - PPO | Admitting: Internal Medicine

## 2023-10-03 ENCOUNTER — Other Ambulatory Visit (INDEPENDENT_AMBULATORY_CARE_PROVIDER_SITE_OTHER): Payer: Commercial Managed Care - PPO

## 2023-10-03 ENCOUNTER — Encounter: Payer: Self-pay | Admitting: Internal Medicine

## 2023-10-03 ENCOUNTER — Other Ambulatory Visit (HOSPITAL_BASED_OUTPATIENT_CLINIC_OR_DEPARTMENT_OTHER): Payer: Self-pay

## 2023-10-03 VITALS — BP 136/80 | HR 84 | Ht 65.0 in | Wt 196.0 lb

## 2023-10-03 DIAGNOSIS — K7030 Alcoholic cirrhosis of liver without ascites: Secondary | ICD-10-CM

## 2023-10-03 DIAGNOSIS — D649 Anemia, unspecified: Secondary | ICD-10-CM

## 2023-10-03 DIAGNOSIS — R600 Localized edema: Secondary | ICD-10-CM

## 2023-10-03 DIAGNOSIS — D509 Iron deficiency anemia, unspecified: Secondary | ICD-10-CM

## 2023-10-03 LAB — IBC + FERRITIN
Ferritin: 15.8 ng/mL — ABNORMAL LOW (ref 22.0–322.0)
Iron: 32 ug/dL — ABNORMAL LOW (ref 42–165)
Saturation Ratios: 9.6 % — ABNORMAL LOW (ref 20.0–50.0)
TIBC: 333.2 ug/dL (ref 250.0–450.0)
Transferrin: 238 mg/dL (ref 212.0–360.0)

## 2023-10-03 LAB — FOLATE: Folate: >25.2 ng/mL (ref >5.9)

## 2023-10-03 MED ORDER — FERROUS SULFATE 325 (65 FE) MG PO TABS
325.0000 mg | ORAL_TABLET | Freq: Every day | ORAL | 3 refills | Status: DC
  Filled 2023-10-03: qty 120, 120d supply, fill #0

## 2023-10-03 MED ORDER — SPIRONOLACTONE 100 MG PO TABS
100.0000 mg | ORAL_TABLET | Freq: Every day | ORAL | 0 refills | Status: DC
  Filled 2023-10-03: qty 30, 30d supply, fill #0

## 2023-10-03 MED ORDER — FUROSEMIDE 40 MG PO TABS
40.0000 mg | ORAL_TABLET | Freq: Every day | ORAL | 1 refills | Status: DC
  Filled 2023-10-03: qty 30, 30d supply, fill #0

## 2023-10-03 NOTE — Progress Notes (Signed)
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 He feels good today. However, he has put on about 30 lbs of fluid on. Endorses swelling mostly in the legs. Denies abdominal swelling. He is taking Lasix 20 mg every day currently. He has been increasing his protein intake. He is drinking a lot of canned soups.  He is taking lactulose PRN. Denies confusion. He is having 3 BMs per day. Denies alcohol use. Last drink of alcohol was 496 days ago. Denies blood in the stools. He does PT and OT at least once per week.  Wt Readings from Last 3 Encounters:  10/03/23 196 lb (88.9 kg)  09/27/23 198 lb (89.8 kg)  09/25/23 200 lb (90.7 kg)   Current Outpatient Medications  Medication Sig Dispense Refill   busPIRone (BUSPAR) 15 MG tablet Take 1 tablet (15 mg total) by mouth 2 (two) times daily. 180 tablet 2   diclofenac Sodium (VOLTAREN) 1 % GEL Apply 4 grams topically 4 (four) times daily to affected joint. 100 g 11   lactulose, encephalopathy, (CHRONULAC) 10 GM/15ML SOLN Take 10 g by mouth daily as needed (constipation).     magnesium oxide (MAG-OX) 400 MG tablet Take 1 tablet (400 mg total) by mouth 2 (two) times daily. 120 tablet 2   nicotine (NICODERM CQ - DOSED IN MG/24 HOURS) 21 mg/24hr patch Apply patch to non-hairy, clean, dry skin on upper body between the neck and waist. Remove the previous day's patch before applying new patch. Rotate or switch site where apply daily. If you have intense, clear, troubling dreams or your cannot fall asleep only wear patch 16 hrs per day with removal at bedtime. 56 patch 0   ondansetron (ZOFRAN-ODT) 8 MG disintegrating tablet Dissolve 1 tablet (8 mg total) by mouth every 8 (eight) hours as needed for nausea. 30 tablet 1    pantoprazole (PROTONIX) 40 MG tablet Take 1 tablet (40 mg total) by mouth daily. 30 tablet 1   QUEtiapine (SEROQUEL) 50 MG tablet Take 0.5-1 tablets (25-50 mg total) by mouth at bedtime. 90 tablet 0   sodium bicarbonate 650 MG tablet Take 1 tablet (650 mg total) by mouth 3 (three) times daily. 90 tablet 0   traMADol (ULTRAM) 50 MG tablet Take 1 tablet (50 mg total) by mouth every 8 (eight) hours as needed for up to 5 days. 15 tablet 0   triamcinolone cream (KENALOG) 0.1 % Apply 1 Application topically 2 (two) times daily. 80 g 0   vortioxetine HBr (TRINTELLIX) 10 MG TABS tablet Take 1 tablet (10 mg total) by mouth daily. 30 tablet 3   furosemide (LASIX) 20 MG tablet Take 1 tablet (20 mg total) by mouth daily. (Patient not taking: Reported on 10/03/2023) 14 tablet 1   No current facility-administered medications for this visit.   Physical Exam: BP 136/80   Pulse 84   Ht 5\' 5"  (1.651 m)   Wt 196 lb (88.9 kg)   BMI 32.62 kg/m  Constitutional: Pleasant,well-developed, male in no acute distress. HEENT: Normocephalic and atraumatic. Conjunctivae are normal. Mild scleral icterus Cardiovascular: Systolic murmur Pulmonary/chest: Effort normal and breath sounds normal. No wheezing, rales or rhonchi. Abdominal: Soft, nondistended, nontender. Bowel sounds active throughout. There are no masses palpable. No hepatomegaly. Extremities: 3+ BLE edema Neurological:  Alert and oriented.  No asterixis Skin: Skin is warm and dry. No rashes noted. Psychiatric: Normal mood and affect. Behavior is normal.  Labs 03/2021: Normal iron, normal ferritin.  Labs 08/2021: Positive PeTH.  Had some issues with hematochezia at that time.  Last CBC with hemoglobin of 10.9 and platelets of 68.  Last BMP unremarkable.  Last set of LFTs shows low albumin of 2.9, elevated AST of 94, ALT normal, normal alk phos, and elevated total bilirubin of 3.1.  Labs 03/2022: CBC with low plts of 64 and low Hb of 9.9. CMP with low potassium of  3 and elevated total bilirubin of 2.1 and elevated AST of 87. TSH nml.   Labs 09/2023: CBC with low Hb of 8.1 and low platelets of 105. BNP elevated at 168.7. CMP with low albumin of 2.3, elevated AST 105, and elevated bilirubin of 3.4.   MELD 3.0: 30 at 09/14/2023  4:21 AM MELD-Na: 29 at 09/14/2023  4:21 AM Calculated from: Serum Creatinine: 1.2 mg/dL at 1/91/4782  9:56 AM Serum Sodium: 123 mmol/L (Using min of 125 mmol/L) at 09/14/2023  4:21 AM Total Bilirubin: 2.5 mg/dL at 10/06/863  7:84 AM Serum Albumin: 1.9 g/dL at 6/96/2952  8:41 AM INR(ratio): 2.4 at 09/12/2023  1:17 AM Age at listing (hypothetical): 57 years Sex: Male at 09/14/2023  4:21 AM  RUQ U/S 09/18/21: IMPRESSION: 1. Echogenic liver with coarsened echotexture and nodular surface contour suggestive of cirrhosis. Evaluation of the liver parenchyma is limited by poor penetration. No obvious liver lesions identified. 2. Cholelithiasis and mild diffuse gallbladder wall thickening. Wall thickening could be secondary to underlying liver disease versus inflammation. No sonographic Eulah Pont sign was evident on exam. If there is high clinical suspicion for cholecystitis, a nuclear medicine hepatobiliary scan may be helpful to further assess.  CTA A/P w/contrast 03/01/22: NON-VASCULAR Changes of cirrhosis with portal venous hypertension. Recanalized umbilical vein and spontaneous left splenorenal shunt. Mild perihepatic and right abdominal ascites. Cholelithiasis.  CT A/P w/contrast 04/03/23: IMPRESSION: 1. No acute finding. 2. Cirrhosis with varices.  No ascites. 3. Cholelithiasis. 4. Atherosclerosis including the coronary arteries  EGD 09/24/21: - LA Grade C esophagitis with no bleeding. Biopsied. - Medium-sized hiatal hernia. - Portal hypertensive gastropathy. - Erythematous mucosa in the antrum. Biopsied. - Normal examined duodenum. Path: . STOMACH, BIOPSY:  -  Reactive gastropathy with mild foveolar hyperplasia.  -   Suggestive of mild vascular ectasia, without thrombi.  -  Negative for an inflammatory infiltrate predictive of Helicobacter  pylori infection.  -  Negative for intestinal metaplasia.  -  Negative for malignancy.   B. ESOPHAGUS, BIOPSY:  -  Squamocolumnar mucosa with no specific pathologic diagnosis (scant  columnar epithelium present).  -  Negative for acute esophagitis and intrasquamous eosinophils.  -  Negative for intestinal metaplasia.  -  No viral cytopathic change or fungi identified (on HE).  -  Negative for dysplasia and malignancy  Colonoscopy 09/24/21: - The examined portion of the ileum was normal. - A tattoo was seen in the sigmoid colon. - Non-bleeding internal hemorrhoids.  EGD 11/20/21: - Normal esophagus. - Medium-sized hiatal hernia. - Portal hypertensive gastropathy. - Normal examined duodenum. - No specimens collected.  EGD 08/18/22: 1. Mild esophagitis 2. No varices 3. Mild to moderate portal hypertensive gastropathy 4. Otherwise unremarkable exam.  Flex sig 08/18/22: - Recently bleeding internal hemorrhoids, as described. - The examination was otherwise normal. - No specimens collected.  ASSESSMENT AND PLAN: EtOH cirrhosis BLE edema Anemia History  of esophagitis Patient presents with significant bilateral lower extremity swelling today.  He has had difficulty following a low-sodium diet so this is likely contributing to this.  Will increase his diuretic therapy to try to help with removal of fluid.  Will check some labs today since patient's anemia is still present.  Patient's encephalopathy appears to be under good control currently.  He has been taking lactulose PRN and he has been having 3 BMs per day on average.  In the meantime we will get him up-to-date with his HCC screening.  Because the patient's MELD score today is 30, I instructed him to go see [facility] again because he may be able to qualify for a liver transplant if he is able to  fulfill their pre-transplant criteria. - Check ferritin/IBC, folate, thiamine - Follow low sodium diet <2 grams per day - Aim for 1-1.5 grams/kg/day of protein - Increase Lasix to 40 mg every day - Start spironolactone 100 mg every day  - Continue lactulose therapy PRN for hepatic encephalopathy - Previously encouraged him to get his hep A and B vaccinations with his PCP.  Patient cannot recall if he has had this done. - Cont PPI QD - Will plan for RUQ U/S for HCC screening - Next EGD for variceal screening due in 07/2024 - Encouraged him to go see [facility] again - RTC in 2 months   Eulah Pont, MD  I spent 45 minutes of time, including in depth chart review, independent review of results as outlined above, communicating results with the patient directly, face-to-face time with the patient, coordinating care, ordering studies and medications as appropriate, and documentation.

## 2023-10-03 NOTE — Patient Instructions (Addendum)
 Your provider has requested that you go to the basement level for lab work before leaving today. Press "B" on the elevator. The lab is located at the first door on the left as you exit the elevator.  You have been scheduled for an abdominal ultrasound at [facility]iology (1st floor of hospital) on 10/07/23 at 9:30 am. Please arrive 30 minutes prior to your appointment for registration. Make certain not to have anything to eat or drink 6 hours prior to your appointment. Should you need to reschedule your appointment, please contact radiology at [phone]. This test typically takes about 30 minutes to perform.  Please follow up with Dr Danise Durie at [facility] transplant   Please follow Low sodium diet less than 2 grams a day  You are schedule for a follow up visit on 12/21/23  We have sent the following medications to your pharmacy for you to pick up at your convenience: Furosemide ,  Spironolactone    If your blood pressure at your visit was 140/90 or greater, please contact your primary care physician to follow up on this.  _______________________________________________________  If you are age 25 or older, your body mass index should be between 23-30. Your Body mass index is 32.62 kg/m. If this is out of the aforementioned range listed, please consider follow up with your Primary Care Provider.  If you are age 53 or younger, your body mass index should be between 19-25. Your Body mass index is 32.62 kg/m. If this is out of the aformentioned range listed, please consider follow up with your Primary Care Provider.   ________________________________________________________  The [facility] providers would like to encourage you to use MYCHART to communicate with providers for non-urgent requests or questions.  Due to long hold times on the telephone, sending your provider a message by [facility] may be a faster and more efficient way to get a response.  Please allow 48 business hours for a  response.  Please remember that this is for non-urgent requests.  _______________________________________________________   Due to recent changes in healthcare laws, you may see the results of your imaging and laboratory studies on MyChart before your provider has had a chance to review them.  We understand that in some cases there may be results that are confusing or concerning to you. Not all laboratory results come back in the same time frame and the provider may be waiting for multiple results in order to interpret others.  Please give us  48 hours in order for your provider to thoroughly review all the results before contacting the office for clarification of your results.   Thank you for entrusting me with your care and for choosing [facility],  Dr. Regino Caprio

## 2023-10-04 ENCOUNTER — Other Ambulatory Visit (HOSPITAL_BASED_OUTPATIENT_CLINIC_OR_DEPARTMENT_OTHER): Payer: Self-pay

## 2023-10-04 ENCOUNTER — Ambulatory Visit: Payer: Self-pay | Admitting: Infectious Diseases

## 2023-10-04 MED ORDER — HYDROCODONE-ACETAMINOPHEN 7.5-325 MG PO TABS
1.0000 | ORAL_TABLET | ORAL | 0 refills | Status: DC | PRN
Start: 2023-10-04 — End: 2024-02-23
  Filled 2023-10-04: qty 12, 2d supply, fill #0

## 2023-10-05 ENCOUNTER — Ambulatory Visit: Payer: Commercial Managed Care - PPO | Admitting: Infectious Diseases

## 2023-10-05 ENCOUNTER — Ambulatory Visit: Payer: Commercial Managed Care - PPO | Admitting: Sports Medicine

## 2023-10-07 ENCOUNTER — Ambulatory Visit (HOSPITAL_COMMUNITY): Payer: Medicare Other

## 2023-10-11 ENCOUNTER — Ambulatory Visit: Payer: Commercial Managed Care - PPO | Admitting: Family Medicine

## 2023-10-13 ENCOUNTER — Ambulatory Visit: Payer: Medicare Other | Admitting: Sports Medicine

## 2023-10-17 ENCOUNTER — Ambulatory Visit (HOSPITAL_COMMUNITY): Admission: RE | Admit: 2023-10-17 | Payer: Medicare Other | Source: Ambulatory Visit

## 2023-10-18 ENCOUNTER — Other Ambulatory Visit (INDEPENDENT_AMBULATORY_CARE_PROVIDER_SITE_OTHER): Payer: Self-pay

## 2023-10-18 ENCOUNTER — Ambulatory Visit (INDEPENDENT_AMBULATORY_CARE_PROVIDER_SITE_OTHER): Payer: Medicare Other | Admitting: Sports Medicine

## 2023-10-18 ENCOUNTER — Ambulatory Visit: Payer: Commercial Managed Care - PPO | Admitting: Sports Medicine

## 2023-10-18 ENCOUNTER — Ambulatory Visit (INDEPENDENT_AMBULATORY_CARE_PROVIDER_SITE_OTHER): Payer: Medicare Other

## 2023-10-18 DIAGNOSIS — S6991XA Unspecified injury of right wrist, hand and finger(s), initial encounter: Secondary | ICD-10-CM

## 2023-10-18 DIAGNOSIS — M1812 Unilateral primary osteoarthritis of first carpometacarpal joint, left hand: Secondary | ICD-10-CM

## 2023-10-18 DIAGNOSIS — M25531 Pain in right wrist: Secondary | ICD-10-CM | POA: Diagnosis not present

## 2023-10-18 HISTORY — DX: Unspecified injury of right wrist, hand and finger(s), initial encounter: S69.91XA

## 2023-10-18 MED ORDER — TRIAMCINOLONE ACETONIDE 40 MG/ML IJ SUSP
20.0000 mg | Freq: Once | INTRAMUSCULAR | Status: AC
  Administered 2023-10-18: 20 mg via INTRAMUSCULAR

## 2023-10-18 NOTE — Addendum Note (Signed)
 Addended by: Samule Dry on: 10/18/2023 03:39 PM   Modules accepted: Orders

## 2023-10-18 NOTE — Assessment & Plan Note (Signed)
 Jaece returns, he has persistent left hand pain, he is status post [facility] arthroplasty/trapeziectomy, unfortunately he continued to have pain long after surgery, back in June 2023 we did a first CMC injection, he is not have a recurrence of pain, due to the duration of response I think is reasonable to try this again, today we may put it more into the scaphotrapezoidal joint. If there is another recurrence I would suggest going back to hand surgery.

## 2023-10-18 NOTE — Assessment & Plan Note (Signed)
 Accidental trip and fall right outstretched hand, now has pain mostly over the ulna, we will add a Velcro brace, x-rays, return to see me in 2 weeks.

## 2023-10-18 NOTE — Progress Notes (Signed)
    Procedures performed today:    Procedure: Real-time Ultrasound Guided injection of the left scaphotrapeziotrapezoidal joint Device: Samsung [id number]  Verbal informed consent obtained.  Time-out conducted.  Noted no overlying erythema, induration, or other signs of local infection.  Skin prepped in a sterile fashion.  Local anesthesia: Topical Ethyl chloride.  With sterile technique and under real time ultrasound guidance: Noted arthritic joint, 1/2 cc lidocaine, 1/2 cc kenalog 40 injected easily Completed without difficulty  Advised to call if fevers/chills, erythema, induration, drainage, or persistent bleeding.  Images permanently stored and available for review in PACS.  Impression: Technically successful ultrasound guided injection.  Independent interpretation of notes and tests performed by another provider:   None.  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 If there is another recurrence I would suggest going back to hand surgery.  Right wrist injury Accidental trip and fall right outstretched hand, now has pain mostly over the ulna, we will add a Velcro brace, x-rays, return to see me in 2 weeks.    ____________________________________________ Ihor Austin. Benjamin Stain, M.D., ABFM., CAQSM., AME. Primary Care and Sports Medicine [facility]  Adjunct Professor of Family Medicine  [employer]  Restaurant manager, fast food

## 2023-10-19 ENCOUNTER — Encounter: Payer: Self-pay | Admitting: Family Medicine

## 2023-10-19 ENCOUNTER — Ambulatory Visit (INDEPENDENT_AMBULATORY_CARE_PROVIDER_SITE_OTHER): Payer: Commercial Managed Care - PPO | Admitting: Family Medicine

## 2023-10-19 VITALS — BP 120/70 | HR 105 | Ht 65.0 in | Wt 196.0 lb

## 2023-10-19 DIAGNOSIS — F332 Major depressive disorder, recurrent severe without psychotic features: Secondary | ICD-10-CM

## 2023-10-19 DIAGNOSIS — E440 Moderate protein-calorie malnutrition: Secondary | ICD-10-CM | POA: Diagnosis not present

## 2023-10-19 DIAGNOSIS — K7031 Alcoholic cirrhosis of liver with ascites: Secondary | ICD-10-CM | POA: Diagnosis not present

## 2023-10-19 NOTE — Assessment & Plan Note (Signed)
 We discussed increasing his protein intake given his liver disease.  Referral was made to dietitian.

## 2023-10-19 NOTE — Progress Notes (Signed)
 Juan [name] - 58 y.o. male MRN [id number]  Date of birth: 09-30-1965  Subjective Chief Complaint  Patient presents with   Medical Management of Chronic Issues    HPI Juan [name] is a 58 y.o. male here today for follow-up visit.  Reports he is doing pretty well at this time.  He continues to see hepatology at [facility].  He is being evaluated for possible transplant.  Does report that he has abstained from alcohol for quite some time.  He is working on increasing his protein intake.  Lasix was increased by his GI physician due to increased edema.  He is seeing psychiatry and has restarted Trintellix.  Feels that mood is stable at this time.  Remains on BuSpar as well for anxiety.  This is moderately effective.  ROS:  A comprehensive ROS was completed and negative except as noted per HPI   Allergies  Allergen Reactions   Apple Juice Swelling and Other (See Comments)    Tongue swelling   Cucumber Extract Itching and Nausea And Vomiting    No extracts; just cucumber   Depakote Er [Divalproex Sodium Er] Swelling and Other (See Comments)    Tongue swelling   Depakote [Valproic Acid] Anaphylaxis   Peanut Butter Flavoring Agent (Non-Screening) Anaphylaxis and Swelling   Peanut Oil Swelling   Peanut-Containing Drug Products Swelling   Shellfish Allergy Itching and Swelling   Shrimp Extract Itching and Swelling   Strawberry Extract Nausea And Vomiting and Swelling   Apple Swelling   Cantaloupe Extract Allergy Skin Test Rash   Codeine Itching, Rash and Other (See Comments)    Patient reports he can take "CODONES" without problems   Firvanq [Vancomycin] Rash   Lactose Intolerance (Gi) Diarrhea and Other (See Comments)    Indigestion, Stomach pain, Flatulence    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   Chronic fatigue     Cirrhosis (HCC)    Colon polyps    Depression    GERD (gastroesophageal reflux disease)    Hemochromatosis    Hiatal hernia 02/20/2022   Hx of blood clots    Leg   Hyperreflexia    Hypertension    PTSD (post-traumatic stress disorder)    Traumatic hemorrhagic shock [facility])     Past Surgical History:  Procedure Laterality Date   BIOPSY  09/24/2021   Procedure: BIOPSY;  Surgeon: Imogene Burn, MD;  Location: [facility] ENDOSCOPY;  Service: Gastroenterology;;   Fidela Salisbury RELEASE Bilateral    COLONOSCOPY WITH PROPOFOL N/A 09/24/2021   Procedure: COLONOSCOPY WITH PROPOFOL;  Surgeon: Imogene Burn, MD;  Location: [facility] ENDOSCOPY;  Service: Gastroenterology;  Laterality: N/A;   ESOPHAGOGASTRODUODENOSCOPY (EGD) WITH PROPOFOL N/A 09/24/2021   Procedure: ESOPHAGOGASTRODUODENOSCOPY (EGD) WITH PROPOFOL;  Surgeon: Imogene Burn, MD;  Location: [facility] ENDOSCOPY;  Service: Gastroenterology;  Laterality: N/A;   ESOPHAGOGASTRODUODENOSCOPY (EGD) WITH PROPOFOL N/A 08/18/2022   Procedure: ESOPHAGOGASTRODUODENOSCOPY (EGD) WITH PROPOFOL;  Surgeon: Hilarie Fredrickson, MD;  Location: [facility] ENDOSCOPY;  Service: Gastroenterology;  Laterality: N/A;   FLEXIBLE SIGMOIDOSCOPY N/A 08/18/2022   Procedure: FLEXIBLE SIGMOIDOSCOPY;  Surgeon: Hilarie Fredrickson, MD;  Location: [facility] ENDOSCOPY;  Service: Gastroenterology;  Laterality: N/A;   FRACTURE SURGERY     left ankle plate   HERNIA REPAIR     inguinal   KNEE SURGERY Right    x 4  SHOULDER SURGERY Bilateral    x 2   VASECTOMY      Social History   Socioeconomic History   Marital status: Married    Spouse name: [name]   Number of children: 2   Years of education: Not on file   Highest education level: 12th grade  Occupational History    Comment: disability  Tobacco Use   Smoking status: Never   Smokeless tobacco: Current    Types: Snuff  Vaping Use   Vaping status: Never Used  Substance and Sexual Activity   Alcohol use: Not Currently   Drug use: Never   Sexual activity:  Yes    Partners: Female  Other Topics Concern   Not on file  Social History Narrative   Lives with wife   Social Drivers of Health   Financial Resource Strain: Low Risk  (10/18/2023)   Overall Financial Resource Strain (CARDIA)    Difficulty of Paying Living Expenses: Not hard at all  Food Insecurity: No Food Insecurity (10/18/2023)   Hunger Vital Sign    Worried About Running Out of Food in the Last Year: Never true    Ran Out of Food in the Last Year: Never true  Transportation Needs: No Transportation Needs (10/18/2023)   PRAPARE - Administrator, Civil Service (Medical): No    Lack of Transportation (Non-Medical): No  Physical Activity: Insufficiently Active (10/18/2023)   Exercise Vital Sign    Days of Exercise per Week: 2 days    Minutes of Exercise per Session: 60 min  Stress: Stress Concern Present (01/09/2023)   [employer] of Occupational Health - Occupational Stress Questionnaire    Feeling of Stress : To some extent  Social Connections: Unknown (10/18/2023)   Social Connection and Isolation Panel [NHANES]    Frequency of Communication with Friends and Family: More than three times a week    Frequency of Social Gatherings with Friends and Family: Once a week    Attends Religious Services: More than 4 times per year    Active Member of [employer] or Organizations: Patient declined    Attends Banker Meetings: Patient declined    Marital Status: Married    Family History  Problem Relation Age of Onset   Pulmonary fibrosis Mother    Hypertension Father    Other Father        liver failure   Diabetes Brother    Breast cancer Paternal Aunt    Colon cancer Neg Hx    Esophageal cancer Neg Hx    Stomach cancer Neg Hx    Rectal cancer Neg Hx     Health Maintenance  Topic Date Due   Medicare Annual Wellness (AWV)  Never done   INFLUENZA VACCINE  11/21/2023 (Originally 03/24/2023)   COVID-19 Vaccine (4 - 2024-25 season) 04/17/2024 (Originally  04/24/2023)   DTaP/Tdap/Td (3 - Td or Tdap) 06/20/2029   Colonoscopy  09/25/2031   Pneumococcal Vaccine 56-45 Years old  Completed   Hepatitis C Screening  Completed   HIV Screening  Completed   Zoster Vaccines- Shingrix  Completed   HPV VACCINES  Aged Out     ----------------------------------------------------------------------------------------------------------------------------------------------------------------------------------------------------------------- Physical Exam BP 120/70 (BP Location: Left Arm, Patient Position: Sitting, Cuff Size: Large)   Pulse (!) 105   Ht 5\' 5"  (1.651 m)   Wt 196 lb (88.9 kg)   SpO2 98%   BMI 32.62 kg/m   Physical Exam Constitutional:      Appearance: Normal appearance.  HENT:     Head: Normocephalic and atraumatic.  Eyes:     General: No scleral icterus. Cardiovascular:     Rate and Rhythm: Normal rate and regular rhythm.  Pulmonary:     Effort: Pulmonary effort is normal.     Breath sounds: Normal breath sounds.  Musculoskeletal:     Cervical back: Neck supple.  Neurological:     Mental Status: He is alert.  Psychiatric:        Mood and Affect: Mood normal.        Behavior: Behavior normal.     ------------------------------------------------------------------------------------------------------------------------------------------------------------------------------------------------------------------- Assessment and Plan  MDD (major depressive disorder), recurrent severe, without psychosis (HCC) He is currently being followed by behavioral health at the [place].  He denies any worsening of symptoms.  No further suicidal ideations at this time.  Continues on Trintellix with BuSpar.  Malnutrition of moderate degree We discussed increasing his protein intake given his liver disease.  Referral was made to dietitian.  Alcoholic cirrhosis of liver with ascites [facility]) Follow-up with hepatology at [facility] well as local gastroenterologist.   LFTs and coags are stable at this time.  Continue to abstain from alcohol.   No orders of the defined types were placed in this encounter.   No follow-ups on file.    This visit occurred during the SARS-CoV-2 public health emergency.  Safety protocols were in place, including screening questions prior to the visit, additional usage of staff PPE, and extensive cleaning of exam room while observing appropriate contact time as indicated for disinfecting solutions.

## 2023-10-19 NOTE — Assessment & Plan Note (Signed)
 Follow-up with hepatology at [facility] well as local gastroenterologist.  LFTs and coags are stable at this time.  Continue to abstain from alcohol.

## 2023-10-19 NOTE — Assessment & Plan Note (Signed)
 He is currently being followed by behavioral health at the [place].  He denies any worsening of symptoms.  No further suicidal ideations at this time.  Continues on Trintellix with BuSpar.

## 2023-10-21 ENCOUNTER — Telehealth: Payer: Self-pay

## 2023-10-21 NOTE — Telephone Encounter (Signed)
 Copied from CRM [id number]. Topic: Clinical - Lab/Test Results >> Oct 21, 2023 12:52 PM Priscille Loveless wrote: Reason for CRM: pt called to see if xray results was in. Please advise, thank you!

## 2023-10-21 NOTE — Telephone Encounter (Signed)
 Mail box full could not leave a voice mail message.  Message sent to patient via Mychart.

## 2023-10-24 ENCOUNTER — Telehealth: Payer: Self-pay

## 2023-10-24 ENCOUNTER — Ambulatory Visit (HOSPITAL_COMMUNITY): Payer: Medicare Other

## 2023-10-24 NOTE — Telephone Encounter (Signed)
 Copied from CRM [id number]. Topic: Clinical - Lab/Test Results >> Oct 24, 2023  9:59 AM Priscille Loveless wrote: Reason for CRM: Pt is calling again about xray results of his hand. He stated that he has been waiting on them. Please advise, thank you.

## 2023-10-24 NOTE — Telephone Encounter (Signed)
 That is because the official reports are of course not back yet, radiology is still behind, on my own personal potation I do see some cystic changes around the pisiform bone, there is potentially fracture through the ulnar styloid process, continue bracing for now until the official report comes back in.  If the official report comes back equivocal we will get advanced imaging, either a CT or an MRI of the wrist.

## 2023-10-25 ENCOUNTER — Ambulatory Visit: Payer: Self-pay | Admitting: Family Medicine

## 2023-10-25 ENCOUNTER — Ambulatory Visit: Payer: Commercial Managed Care - PPO | Admitting: Infectious Diseases

## 2023-10-25 NOTE — Telephone Encounter (Signed)
 Attempted call to patient. Left a voice mail message requesting a return call.

## 2023-10-25 NOTE — Telephone Encounter (Signed)
 Chief Complaint: Right ear pain Symptoms: Sinus congestion, pain to the right jaw, ear pain 10/10, fever 101 today, cough Frequency: Onset 2 days ago ear pain Pertinent Negatives: Patient denies other symptoms Disposition: [] ED /[] Urgent Care (no appt availability in office) / [x] Appointment(In office/virtual)/ []  [facility]/ [] Home Care/ [] Refused Recommended Disposition /[] [facility]/ []  Follow-up with PCP Additional Notes: Patient says the ear pain started 2 days ago as the main symptom, then the facial pain, sinus congestion, cough and fever this morning. He says he's taking Tylenol and being mindful of the amount due to his liver. Advised in person or virtual, he says he can come in, advised to wear a mask. No availability with PCP until Thursday, agrees to see any provider, scheduled in person tomorrow.    Copied From CRM [id number]. Reason for Triage: right ear pain, sinus issues, had a fever last night of 101, please call patient [phone]  Reason for Disposition  Earache  (Exceptions: brief ear pain of < 60 minutes duration, earache occurring during air travel  Answer Assessment - Initial Assessment Questions 1. LOCATION: "Which ear is involved?"     Right ear 2. ONSET: "When did the ear start hurting"      2 days 3. SEVERITY: "How bad is the pain?"  (Scale 1-10; mild, moderate or severe)   - MILD (1-3): doesn't interfere with normal activities    - MODERATE (4-7): interferes with normal activities or awakens from sleep    - SEVERE (8-10): excruciating pain, unable to do any normal activities      10 4. URI SYMPTOMS: "Do you have a runny nose or cough?"     Cough last night 5. FEVER: "Do you have a fever?" If Yes, ask: "What is your temperature, how was it measured, and when did it start?"     101 this morning 6. OTHER SYMPTOMS: "Do you have any other symptoms?" (e.g., headache, stiff neck, dizziness, vomiting, runny nose, decreased hearing)     Sinus  congestion w/pain around right jaw, decreased hearing in right ear, right ear clogged up  Protocols used: Davina Poke

## 2023-10-26 ENCOUNTER — Other Ambulatory Visit (HOSPITAL_BASED_OUTPATIENT_CLINIC_OR_DEPARTMENT_OTHER): Payer: Self-pay

## 2023-10-26 ENCOUNTER — Ambulatory Visit (INDEPENDENT_AMBULATORY_CARE_PROVIDER_SITE_OTHER): Admitting: Physician Assistant

## 2023-10-26 ENCOUNTER — Encounter: Payer: Self-pay | Admitting: Physician Assistant

## 2023-10-26 ENCOUNTER — Ambulatory Visit (HOSPITAL_COMMUNITY): Admission: RE | Admit: 2023-10-26 | Source: Ambulatory Visit

## 2023-10-26 VITALS — BP 148/85 | HR 94 | Ht 65.0 in | Wt 187.2 lb

## 2023-10-26 DIAGNOSIS — H66001 Acute suppurative otitis media without spontaneous rupture of ear drum, right ear: Secondary | ICD-10-CM

## 2023-10-26 MED ORDER — FLUTICASONE PROPIONATE 50 MCG/ACT NA SUSP
2.0000 | Freq: Every day | NASAL | 0 refills | Status: DC
Start: 2023-10-26 — End: 2024-02-23
  Filled 2023-10-26: qty 16, 30d supply, fill #0

## 2023-10-26 MED ORDER — AMOXICILLIN-POT CLAVULANATE 875-125 MG PO TABS
1.0000 | ORAL_TABLET | Freq: Two times a day (BID) | ORAL | 0 refills | Status: DC
  Filled 2023-10-26: qty 20, 10d supply, fill #0

## 2023-10-26 NOTE — Patient Instructions (Signed)

## 2023-10-26 NOTE — Progress Notes (Signed)
 Acute Office Visit  Subjective:     Patient ID: Juan [name], male    DOB: [birth year], 58 y.o.   MRN: [id number]  CC: ear aches  HPI Patient is a 58 yo male who presents with a chief complaint of ear pain. Tmax 101.60F. He states the pain is bilateral and the left is more pronounced. He has also been experiencing body aches and a fever since Monday. He states he did recently get over pneumonia. Denies cough, shortness of breath, chest pain. He is not taking any medication.   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   MDD (major depressive disorder), recurrent severe, 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   Laceration of extensor hallucis longus tendon, left, initial encounter 02/06/2017   History of bilateral inguinal hernia repair 01/19/2019   Esophagitis, [place] grade D 12/05/2019   Fracture of laryngeal cartilage (HCC) 01/17/2020   PTSD (post-traumatic stress disorder) 04/16/2019   Chronic fatigue 12/21/2016   Benign essential hypertension 12/21/2016   History of alcohol abuse 05/20/2020   Impingement syndrome, shoulder, right 05/20/2020   Allergic reaction 07/08/2020   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 07/11/2020   Thrombocytopenia (HCC) 11/02/2020   Well adult exam 01/06/2021   Systolic murmur 01/06/2021   Right ankle sprain 02/12/2021   Dizziness 03/23/2021   Urinary frequency 03/23/2021   Tibialis posterior tendinitis, right 04/14/2021   Right lower quadrant pain 04/17/2021   Diverticulosis 04/20/2021   Tinea pedis 05/12/2021   Pancytopenia (HCC) 09/18/2021   Portal hypertensive gastropathy (HCC)    Shoulder pain, left, posterior 12/22/2021   Hypokalemia 01/11/2022   Hypomagnesemia 01/11/2022   Nonrheumatic aortic valve stenosis    Malnutrition of moderate degree 01/11/2022   DDD (degenerative disc disease), cervical 01/22/2022   Alcoholic cirrhosis of liver with ascites (HCC) 02/17/2022    Situational anxiety 02/25/2022   Hyponatremia 03/02/2022   GERD (gastroesophageal reflux disease) 03/02/2022   Normocytic anemia 03/02/2022   Tobacco chew use 03/02/2022   Prolonged QT interval 05/05/2022   Anemia of chronic disease 05/05/2022   Lumbar degenerative disc disease 07/26/2022   GI bleed 08/16/2022   Hyperbilirubinemia 08/17/2022   Anxiety 08/17/2022   Lower extremity edema 09/06/2022   Hematemesis 09/13/2022   Epistaxis 09/14/2022   Anasarca 09/14/2022   Bilateral lower extremity edema 10/12/2022   Rib pain on right side 10/31/2022   Head injury 10/31/2022   Acute bacterial sinusitis 11/15/2022   Post-traumatic osteoarthritis of right knee 01/04/2023   Breast nodule 01/12/2023   Eustachian tube dysfunction 03/02/2023   Acute pain of right knee 03/10/2023   Chest trauma 03/18/2023   Pain of left calf 04/11/2023   Bipolar 2 disorder, major depressive episode (HCC) 05/23/2023   Alcohol use disorder 05/23/2023   Arthritis 05/23/2023   Febrile illness 06/27/2023   AKI (acute kidney injury) (HCC) 09/11/2023   Bacteremia due to Enterococcus 09/21/2023   Right wrist injury 10/18/2023   Resolved Ambulatory Problems    Diagnosis Date Noted   Ulcerative colitis without complications (HCC) 12/02/2017   Tear of right rotator cuff 04/21/2016   Synovitis of left ankle 07/29/2014   Elevated liver enzymes 01/07/2017   GAD (generalized anxiety disorder) 04/16/2019   Chronic alcoholic liver disease (HCC) 09/30/2019   COVID-19 08/07/2020   COVID-19 virus infection 08/13/2020   Hypocalcemia 11/02/2020   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   Relationship problems 03/23/2021   Nausea vomiting and diarrhea    Hematochezia  Chest pain 01/10/2022   Alcohol abuse 01/11/2022   Lower GI bleed 03/01/2022   Cirrhosis of liver (HCC) 03/02/2022   Elevated LFTs 03/02/2022   Syncope 05/05/2022   High anion gap metabolic acidosis 05/05/2022   Acute blood loss anemia  08/17/2022   Bleeding internal hemorrhoids 08/18/2022   Influenza 08/31/2022   Major depressive disorder, recurrent severe without psychotic features (HCC) 05/22/2023   SIRS (systemic inflammatory response syndrome) (HCC) 06/19/2023   Sepsis secondary to UTI (HCC) 06/20/2023   Fever 06/20/2023   Recurrent major depressive episodes, moderate (HCC) 07/11/2023   Past Medical History:  Diagnosis Date   Alcohol addiction (HCC)    Cirrhosis (HCC)    Colon polyps    Depression    Hemochromatosis    Hiatal hernia 02/20/2022   Hx of blood clots    Hyperreflexia    Hypertension    Traumatic hemorrhagic shock (HCC)      ROS:  See HPI  Objective:    BP (!) 148/85 (BP Location: Left Arm, Patient Position: Sitting, Cuff Size: Large)   Pulse 94   Ht 5\' 5"  (1.651 m)   Wt 187 lb 4 oz (84.9 kg)   SpO2 96%   BMI 31.16 kg/m  BP Readings from Last 3 Encounters:  10/26/23 (!) 148/85  10/19/23 120/70  10/03/23 136/80   Wt Readings from Last 3 Encounters:  10/26/23 187 lb 4 oz (84.9 kg)  10/19/23 196 lb (88.9 kg)  10/03/23 196 lb (88.9 kg)   SpO2 Readings from Last 3 Encounters:  10/26/23 96%  10/19/23 98%  09/27/23 99%      Physical Exam Constitutional:      Appearance: Normal appearance.  HENT:     Head: Normocephalic and atraumatic.     Right Ear: Ear canal normal.     Left Ear: Ear canal normal.     Ears:     Comments: Left ear: middle ear effusion, bulging TM   Right ear: erythematous, bulging TM    Nose: Nose normal.     Mouth/Throat:     Mouth: Mucous membranes are moist.     Pharynx: Oropharynx is clear.  Eyes:     Extraocular Movements: Extraocular movements intact.  Cardiovascular:     Rate and Rhythm: Normal rate and regular rhythm.     Pulses: Normal pulses.     Heart sounds: Normal heart sounds.  Pulmonary:     Effort: Pulmonary effort is normal.     Breath sounds: Normal breath sounds.  Musculoskeletal:        General: Normal range of motion.      Cervical back: Normal range of motion.  Skin:    General: Skin is warm.  Neurological:     Mental Status: He is alert.    Assessment & Plan:  Marland KitchenMarland KitchenDrayson was seen today for ear pain.  Diagnoses and all orders for this visit:  Non-recurrent acute suppurative otitis media of right ear without spontaneous rupture of tympanic membrane -     amoxicillin-clavulanate (AUGMENTIN) 875-125 MG tablet; Take 1 tablet by mouth 2 (two) times daily. -     fluticasone (FLONASE) 50 MCG/ACT nasal spray; Place 2 sprays into both nostrils daily.     - Start Augmentin abx for middle ear infection  - Start Flonase nasal spray Follow up as needed if symptoms persist or worsen  Tandy Gaw, PA-C

## 2023-10-26 NOTE — Telephone Encounter (Signed)
 Patient seen in office 10/26/23 by Tandy Gaw, PA

## 2023-10-27 ENCOUNTER — Telehealth: Payer: Self-pay

## 2023-10-27 NOTE — Telephone Encounter (Signed)
 Patient informed.

## 2023-10-27 NOTE — Telephone Encounter (Signed)
 I put my own interpretation on the telephone note from 10/24/2023.  No need to move it up.

## 2023-10-27 NOTE — Telephone Encounter (Signed)
 Would you like me to move up x-ray to STAT read?

## 2023-10-27 NOTE — Telephone Encounter (Signed)
 Copied from CRM [id number]. Topic: Clinical - Lab/Test Results >> Oct 27, 2023  8:38 AM Nila Nephew wrote: Reason for CRM: Patient calling to inquire about imaging results for his X-Ray done on his right wrist. Patient requesting an expedited read on this if possible, as he would like to know the results and have them in hand for his next visit to provider.

## 2023-10-27 NOTE — Telephone Encounter (Signed)
 Patient informed of DR. Thekkekandams notations regarding x-ray results.

## 2023-10-28 ENCOUNTER — Encounter: Payer: Self-pay | Admitting: Physician Assistant

## 2023-10-28 NOTE — Telephone Encounter (Signed)
 Received results - Does need patient need cast or wear brace that was given

## 2023-10-28 NOTE — Telephone Encounter (Signed)
 Left message for a return call

## 2023-10-31 ENCOUNTER — Encounter: Payer: Self-pay | Admitting: Dietician

## 2023-10-31 ENCOUNTER — Encounter: Payer: Commercial Managed Care - PPO | Attending: Family Medicine | Admitting: Dietician

## 2023-10-31 DIAGNOSIS — E8809 Other disorders of plasma-protein metabolism, not elsewhere classified: Secondary | ICD-10-CM | POA: Diagnosis not present

## 2023-10-31 DIAGNOSIS — K7030 Alcoholic cirrhosis of liver without ascites: Secondary | ICD-10-CM | POA: Insufficient documentation

## 2023-10-31 NOTE — Progress Notes (Signed)
 Medical Nutrition Therapy  Appointment Start time:  0930  Appointment End time:  1035  Primary concerns today: Getting healthier, Improving labs Referral diagnosis: E88.09 - Hypoalbuminemia, K70.31 - Alcoholic cirrhosis of liver with ascites Preferred learning style: No preference indicated Learning readiness:  Ready   NUTRITION ASSESSMENT    Clinical Medical Hx: HTN, GERD, AKI, ETOH abuse, Diverticulosis Medications: Furosemide (PRN), Quetiapine, Sodium Bicarbonate, MagOx, Lactulose, Pantoprazole Labs: Calcium - 7.8, Protein - 6.1, Albumin - 2.3, AST - 105, Bilirubin - 3.4, BNP -168.7, Iron - 32, Ferritin - 15.8 Notable Signs/Symptoms: N/A   Lifestyle & Dietary Hx Pt states they would like to get healthier and improve lab values.  Pt reports quitting drinking over a year and a half ago, reports history of ETOH abuse.  Pt reports edema BLE, legs will swell if they are seated for too long, pt states they will get up and walk every hour or so. Pt reports taking Lasix nightly, following a 1800 mL fluid restriction currently. Pt reports trying to reduce sodium in their diet for the past 4 months, has been reducing Goldfish, canned soup. Pt reports trying to increase protein intake as well, drinking Premier protein shake each morning, added protein bars as snacks. Pt reports overall low appetite, states they eat small portions at meals. Pt reports doing physical/occupational therapy twice a week.    Estimated daily fluid intake: 48 oz Supplements: KCl, MVI Sleep: Has difficulty falling asleep Stress / self-care: Low Current average weekly physical activity: ADLs, PT/OT   24-Hr Dietary Recall First Meal: Rasin Bran, Lactaid 2% milk Snack: none Second Meal: Protein shake, peach cup Snack: none Third Meal: Chicken and rice Snack: none Beverages: Water, Body Armor   NUTRITION DIAGNOSIS  [place]-2.2 Altered nutrition-related laboratory As related to cirrhosis.  As evidenced by AST of  105, Albumin of 2.3, Bilirubin of 3.4, Hx ETOH abuse.   NUTRITION INTERVENTION  Nutrition education (E-1) on the following topics:  Educated patient on the relationship between dietary sodium intake and hypertension. Recommended between 1,500 and 2,000 mg of sodium per day. Educated patient on common sources of high sodium foods including packaged/processed foods, deli meats, fast foods, pickled food, sports drinks, and canned foods.   Educated patient on the role of increasing protein on increasing albumin levels. Worked with patient to identify sources of protein. Educated patient to eat 5-6 times a day to maximize protein intake.   Handouts Provided Include  Protein Food List Low Sodium   Learning Style & Readiness for Change Teaching method utilized: Visual & Auditory  Demonstrated degree of understanding via: Teach Back  Barriers to learning/adherence to lifestyle change: None  Goals Established by Pt Begin to prioritize protein at every meal!! Add a greek yogurt with your lunch each day, have Sunbutter with your bananas. Continue to work on following a low sodium diet. Use your handout as a guide to choosing low sodium foods/beverages! Try having more ZERO calorie beverages and moderate your Body Armor and orange juice intake. If having V8, choose the low sodium version!   MONITORING & EVALUATION Dietary intake, weekly physical activity, and labs PRN.  Next Steps  Patient is to call to schedule follow up after next labs.

## 2023-10-31 NOTE — Patient Instructions (Addendum)
 Begin to prioritize protein at every meal!! Add a greek yogurt with your lunch each day, have Sunbutter with your bananas.  Continue to work on following a low sodium diet. Use your handout as a guide to choosing low sodium foods/beverages!  Try having more ZERO calorie beverages and moderate your Body Armor and orange juice intake.  If having V8, choose the low sodium version!

## 2023-11-01 ENCOUNTER — Ambulatory Visit (INDEPENDENT_AMBULATORY_CARE_PROVIDER_SITE_OTHER): Payer: Medicare Other | Admitting: Sports Medicine

## 2023-11-01 ENCOUNTER — Encounter: Payer: Self-pay | Admitting: Sports Medicine

## 2023-11-01 DIAGNOSIS — S6991XD Unspecified injury of right wrist, hand and finger(s), subsequent encounter: Secondary | ICD-10-CM | POA: Diagnosis not present

## 2023-11-01 DIAGNOSIS — M1812 Unilateral primary osteoarthritis of first carpometacarpal joint, left hand: Secondary | ICD-10-CM | POA: Diagnosis not present

## 2023-11-01 NOTE — Assessment & Plan Note (Signed)
 Juan [name] returns, I last saw him on the 25th of last month with persistent left hand pain, he is status post [facility] arthroplasty/trapeziectomy, unfortunately continued to have pain long after surgery. Due to persistence of pain we did an injection to the scaphotrapezoidal joint. Returns today pain-free.

## 2023-11-01 NOTE — Progress Notes (Signed)
    Procedures performed today:    None.  Independent interpretation of notes and tests performed by another provider:   None.  Brief History, Exam, Impression, and Recommendations:    Right wrist injury Gaylord returns, he is a 58 year old male, he had a fall onto an outstretched hand sometime ago, unfortunately continues to have pain now over 6 weeks in spite of physician directed conservative treatment, x-rays were negative, pain is ulnar-sided worse with twisting and ulnar deviation concerning for TFCC injury versus ECU subluxation. At this point we need an MRI for procedural planning.  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 Due to persistence of pain we did an injection to the scaphotrapezoidal joint. Returns today pain-free.    ____________________________________________ Ihor Austin. Benjamin Stain, M.D., ABFM., CAQSM., AME. Primary Care and Sports Medicine [facility]  Adjunct Professor of Family Medicine  [employer] [facility] [employer]  Restaurant manager, fast food

## 2023-11-01 NOTE — Assessment & Plan Note (Signed)
 Juan [name] returns, he is a 58 year old male, he had a fall onto an outstretched hand sometime ago, unfortunately continues to have pain now over 6 weeks in spite of physician directed conservative treatment, x-rays were negative, pain is ulnar-sided worse with twisting and ulnar deviation concerning for TFCC injury versus ECU subluxation. At this point we need an MRI for procedural planning.

## 2023-11-02 ENCOUNTER — Ambulatory Visit (HOSPITAL_COMMUNITY)
Admission: RE | Admit: 2023-11-02 | Discharge: 2023-11-02 | Disposition: A | Discharge status: Home / Self Care | Source: Ambulatory Visit | Attending: Internal Medicine | Admitting: Internal Medicine

## 2023-11-02 DIAGNOSIS — K8020 Calculus of gallbladder without cholecystitis without obstruction: Secondary | ICD-10-CM | POA: Diagnosis not present

## 2023-11-02 DIAGNOSIS — K7030 Alcoholic cirrhosis of liver without ascites: Secondary | ICD-10-CM | POA: Diagnosis not present

## 2023-11-02 DIAGNOSIS — R600 Localized edema: Secondary | ICD-10-CM | POA: Insufficient documentation

## 2023-11-02 DIAGNOSIS — K760 Fatty (change of) liver, not elsewhere classified: Secondary | ICD-10-CM | POA: Diagnosis not present

## 2023-11-06 ENCOUNTER — Encounter: Payer: Self-pay | Admitting: Internal Medicine

## 2023-11-11 ENCOUNTER — Other Ambulatory Visit: Payer: Self-pay

## 2023-11-11 DIAGNOSIS — I10 Essential (primary) hypertension: Secondary | ICD-10-CM | POA: Insufficient documentation

## 2023-11-11 DIAGNOSIS — R292 Abnormal reflex: Secondary | ICD-10-CM | POA: Insufficient documentation

## 2023-11-11 DIAGNOSIS — K635 Polyp of colon: Secondary | ICD-10-CM | POA: Insufficient documentation

## 2023-11-11 DIAGNOSIS — F32A Depression, unspecified: Secondary | ICD-10-CM | POA: Insufficient documentation

## 2023-11-11 DIAGNOSIS — F1020 Alcohol dependence, uncomplicated: Secondary | ICD-10-CM | POA: Insufficient documentation

## 2023-11-11 DIAGNOSIS — K7460 Unspecified cirrhosis of liver: Secondary | ICD-10-CM | POA: Insufficient documentation

## 2023-11-11 DIAGNOSIS — Z86718 Personal history of other venous thrombosis and embolism: Secondary | ICD-10-CM | POA: Insufficient documentation

## 2023-11-11 DIAGNOSIS — T794XXA Traumatic shock, initial encounter: Secondary | ICD-10-CM | POA: Insufficient documentation

## 2023-11-12 ENCOUNTER — Ambulatory Visit (INDEPENDENT_AMBULATORY_CARE_PROVIDER_SITE_OTHER)

## 2023-11-12 DIAGNOSIS — S63011A Subluxation of distal radioulnar joint of right wrist, initial encounter: Secondary | ICD-10-CM | POA: Diagnosis not present

## 2023-11-12 DIAGNOSIS — R600 Localized edema: Secondary | ICD-10-CM | POA: Diagnosis not present

## 2023-11-12 DIAGNOSIS — S6991XD Unspecified injury of right wrist, hand and finger(s), subsequent encounter: Secondary | ICD-10-CM | POA: Diagnosis not present

## 2023-11-17 ENCOUNTER — Ambulatory Visit: Payer: Commercial Managed Care - PPO | Attending: Cardiology | Admitting: Cardiology

## 2023-11-17 ENCOUNTER — Encounter: Payer: Self-pay | Admitting: Cardiology

## 2023-11-17 VITALS — BP 128/60 | HR 87 | Ht 65.0 in | Wt 188.1 lb

## 2023-11-17 DIAGNOSIS — I10 Essential (primary) hypertension: Secondary | ICD-10-CM | POA: Diagnosis not present

## 2023-11-17 DIAGNOSIS — E66811 Obesity, class 1: Secondary | ICD-10-CM | POA: Diagnosis not present

## 2023-11-17 DIAGNOSIS — I350 Nonrheumatic aortic (valve) stenosis: Secondary | ICD-10-CM

## 2023-11-17 HISTORY — DX: Nonrheumatic aortic (valve) stenosis: I35.0

## 2023-11-17 HISTORY — DX: Obesity, class 1: E66.811

## 2023-11-17 NOTE — Patient Instructions (Signed)

## 2023-11-17 NOTE — Progress Notes (Signed)
 Cardiology Office Note:    Date:  11/17/2023   ID:  Juan [name], DOB February 12, 1966, MRN [id number]  PCP:  Everrett Coombe, DO  Cardiologist:  Garwin Brothers, MD   Referring MD: Suanne Marker, MD    ASSESSMENT:    1. Hypertension, unspecified type   2. Moderate aortic stenosis   3. Obesity (BMI 30.0-34.9)    PLAN:    In order of problems listed above:  Primary prevention stressed with the patient.  Importance of compliance with diet medication stressed and patient verbalized standing. He was advised to ambulate to the best of his ability.  He has issues with ambulation and ambulates with a walker.  This is related to his alcoholic condition in the past according to the patient and his wife. Moderate aortic stenosis: Asymptomatic.  We will continue to monitor and review this annually.  Symptoms of aortic stenosis were discussed and questions were answered to their satisfaction. History of alcohol abuse elevated LFTs: Managed by primary care and appears stable.  He promises never to go back to.  He does not smoke. Patient will be seen in follow-up appointment in 6 months or earlier if the patient has any concerns.    Medication Adjustments/Labs and Tests Ordered: Current medicines are reviewed at length with the patient today.  Concerns regarding medicines are outlined above.  Orders Placed This Encounter  Procedures   EKG 12-Lead   No orders of the defined types were placed in this encounter.    No chief complaint on file.    History of Present Illness:    Juan [name] is a 58 y.o. male.  Past medical history longstanding alcohol use and cirrhosis.  He quit using alcohol more than a year.  His wife accompanies him for this visit.  They are referred here for cardiac murmur.  Echo was done and reveals moderate aortic stenosis.  He denies any chest pain orthopnea, syncope or dyspnea on exertion.  At the time of my evaluation, the patient is alert awake oriented and  in no distress.  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  Chronic GERD with HH  09/2019--EGD--[facility], [place]--showed no varices; gr D esophagitis;  Rx: PPI daily     Eustachian tube dysfunction 03/02/2023   Febrile illness 06/27/2023   Fracture of  laryngeal cartilage (HCC) 01/17/2020   Last Assessment & Plan:   Formatting of this note might be different from the original.  Emergency department follow-up for evaluation of laryngeal trauma.  CT obtained the night of the injury is reviewed independently and shows nondisplaced fracture of the thyroid cartilage.  In general, his voice is much improved from the night of the trauma.  Denies any difficulty breathing.  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 07/11/2020   Lower extremity edema 09/06/2022   Lumbar degenerative disc disease 07/26/2022   Malnutrition of moderate degree 01/11/2022   MDD (major depressive disorder), recurrent severe, 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   Nonrheumatic aortic valve stenosis    Normocytic anemia 03/02/2022   Pain of left calf 04/11/2023   Pancytopenia (HCC) 09/18/2021   Portal hypertensive gastropathy (HCC)    Post-traumatic osteoarthritis of right knee 01/04/2023   Prolonged QT interval 05/05/2022   PTSD (post-traumatic stress disorder)    Rib pain on right side 10/31/2022   Right ankle sprain 02/12/2021   Right lower quadrant pain 04/17/2021   Right wrist injury 10/18/2023   Shoulder pain, left, posterior 12/22/2021   Systolic murmur 01/06/2021   Thrombocytopenia (HCC) 11/02/2020   Tibialis posterior tendinitis, right 04/14/2021   Tinea pedis 05/12/2021   Tobacco chew use 03/02/2022   Traumatic hemorrhagic shock (HCC)    Urinary frequency 03/23/2021   Well adult exam 01/06/2021    Past Surgical History:  Procedure Laterality Date   BIOPSY  09/24/2021   Procedure: BIOPSY;  Surgeon: Imogene Burn, MD;  Location: [facility] ENDOSCOPY;  Service: Gastroenterology;;   Fidela Salisbury RELEASE Bilateral    COLONOSCOPY WITH PROPOFOL N/A 09/24/2021   Procedure: COLONOSCOPY WITH PROPOFOL;  Surgeon: Imogene Burn, MD;  Location: [facility] ENDOSCOPY;   Service: Gastroenterology;  Laterality: N/A;   ESOPHAGOGASTRODUODENOSCOPY (EGD) WITH PROPOFOL N/A 09/24/2021   Procedure: ESOPHAGOGASTRODUODENOSCOPY (EGD) WITH PROPOFOL;  Surgeon: Imogene Burn, MD;  Location: [facility] ENDOSCOPY;  Service: Gastroenterology;  Laterality: N/A;   ESOPHAGOGASTRODUODENOSCOPY (EGD) WITH PROPOFOL N/A 08/18/2022   Procedure: ESOPHAGOGASTRODUODENOSCOPY (EGD) WITH PROPOFOL;  Surgeon: Hilarie Fredrickson, MD;  Location: [facility] ENDOSCOPY;  Service: Gastroenterology;  Laterality: N/A;   FLEXIBLE SIGMOIDOSCOPY N/A 08/18/2022   Procedure: FLEXIBLE SIGMOIDOSCOPY;  Surgeon: Hilarie Fredrickson, MD;  Location: [facility] ENDOSCOPY;  Service: Gastroenterology;  Laterality: N/A;   FRACTURE SURGERY     left ankle plate   HERNIA REPAIR     inguinal   KNEE SURGERY Right    x 4   SHOULDER SURGERY Bilateral    x 2   VASECTOMY      Current Medications: Current Meds  Medication Sig   lactulose, encephalopathy, (CHRONULAC) 10 GM/15ML SOLN Take 10 g by mouth daily as needed (constipation).   QUEtiapine (SEROQUEL) 50 MG tablet Take 0.5-1 tablets (25-50 mg total) by mouth at bedtime.     Allergies:  Apple juice, Cucumber extract, Depakote er [divalproex sodium er], Depakote [valproic acid], Peanut butter flavoring agent (non-screening), Peanut oil, Peanut-containing drug products, Shellfish allergy, Shrimp extract, Apple, Cantaloupe extract allergy skin test, Codeine, Firvanq [vancomycin], and Lactose intolerance (gi)   Social History   Socioeconomic History   Marital status: Married    Spouse name: [name]   Number of children: 2   Years of education: Not on file   Highest education level: 12th grade  Occupational History    Comment: disability  Tobacco Use   Smoking status: Never   Smokeless tobacco: Current    Types: Snuff  Vaping Use   Vaping status: Never Used  Substance and Sexual Activity   Alcohol use: Not Currently   Drug use: Never   Sexual activity: Yes    Partners: Female  Other  Topics Concern   Not on file  Social History Narrative   Lives with wife   Social Drivers of Health   Financial Resource Strain: Low Risk  (10/18/2023)   Overall Financial Resource Strain (CARDIA)    Difficulty of Paying Living Expenses: Not hard at all  Food Insecurity: No Food Insecurity (10/18/2023)   Hunger Vital Sign    Worried About Running Out of Food in the Last Year: Never true    Ran Out of Food in the Last Year: Never true  Transportation Needs: No Transportation Needs (10/18/2023)   PRAPARE - Administrator, Civil Service (Medical): No    Lack of Transportation (Non-Medical): No  Physical Activity: Insufficiently Active (10/18/2023)   Exercise Vital Sign    Days of Exercise per Week: 2 days    Minutes of Exercise per Session: 60 min  Stress: Stress Concern Present (01/09/2023)   [employer] of Occupational Health - Occupational Stress Questionnaire    Feeling of Stress : To some extent  Social Connections: Unknown (10/18/2023)   Social Connection and Isolation Panel [NHANES]    Frequency of Communication with Friends and Family: More than three times a week    Frequency of Social Gatherings with Friends and Family: Once a week    Attends Religious Services: More than 4 times per year    Active Member of [employer] or Organizations: Patient declined    Attends Banker Meetings: Patient declined    Marital Status: Married     Family History: The patient's family history includes Breast cancer in his paternal aunt; Diabetes in his brother; Hypertension in his father; Other in his father; Pulmonary fibrosis in his mother. There is no history of Colon cancer, Esophageal cancer, Stomach cancer, or Rectal cancer.  ROS:   Please see the history of present illness.    All other systems reviewed and are negative.  EKGs/Labs/Other Studies Reviewed:    The following studies were reviewed today: .Marland KitchenEKG Interpretation Date/Time:  Thursday November 17 2023  10:31:54 EDT Ventricular Rate:  87 PR Interval:  154 QRS Duration:  92 QT Interval:  388 QTC Calculation: 466 R Axis:   15  Text Interpretation: Normal sinus rhythm Possible Inferior infarct , age undetermined Cannot rule out Anterior infarct , age undetermined When compared with ECG of 23-Sep-2023 12:59, PREVIOUS ECG IS PRESENT Confirmed by Belva Crome [phone]) on 11/17/2023 10:40:49 AM     Recent Labs: 09/21/2023: Magnesium 1.6 09/25/2023: ALT 31; B Natriuretic Peptide 168.7; BUN 7; Creatinine, Ser 0.76; Hemoglobin 8.1; Platelets 105; Potassium 3.8; Sodium 136  Recent Lipid Panel    Component Value Date/Time   CHOL  130 05/12/2023 1433   TRIG 84 05/12/2023 1433   HDL 29 (L) 05/12/2023 1433   CHOLHDL 5.2 (H) 04/09/2022 0000   LDLCALC 84 05/12/2023 1433   LDLCALC 123 (H) 04/09/2022 0000    Physical Exam:    VS:  BP 128/60   Pulse 87   Ht 5\' 5"  (1.651 m)   Wt 188 lb 1.3 oz (85.3 kg)   SpO2 97%   BMI 31.30 kg/m     Wt Readings from Last 3 Encounters:  11/17/23 188 lb 1.3 oz (85.3 kg)  10/26/23 187 lb 4 oz (84.9 kg)  10/19/23 196 lb (88.9 kg)     GEN: Patient is in no acute distress HEENT: Normal NECK: No JVD; No carotid bruits LYMPHATICS: No lymphadenopathy CARDIAC: Hear sounds regular, 2/6 systolic murmur at the apex. RESPIRATORY:  Clear to auscultation without rales, wheezing or rhonchi  ABDOMEN: Soft, non-tender, non-distended MUSCULOSKELETAL:  No edema; No deformity  SKIN: Warm and dry NEUROLOGIC:  Alert and oriented x 3 PSYCHIATRIC:  Normal affect   Signed, Garwin Brothers, MD  11/17/2023 10:54 AM    [facility]

## 2023-11-21 ENCOUNTER — Ambulatory Visit: Admitting: Infectious Diseases

## 2023-11-21 ENCOUNTER — Telehealth: Payer: Self-pay | Admitting: Internal Medicine

## 2023-11-21 NOTE — Telephone Encounter (Signed)
 Noted.

## 2023-11-21 NOTE — Telephone Encounter (Signed)
 Inbound call from [facility] wanting to advised patient has been scheduled for Thursday April 3rd with Dr. Freida Busman for consultation.

## 2023-11-23 ENCOUNTER — Inpatient Hospital Stay: Admitting: Internal Medicine

## 2023-11-24 DIAGNOSIS — K766 Portal hypertension: Secondary | ICD-10-CM | POA: Diagnosis not present

## 2023-11-24 DIAGNOSIS — K7030 Alcoholic cirrhosis of liver without ascites: Secondary | ICD-10-CM | POA: Diagnosis not present

## 2023-11-24 DIAGNOSIS — K8020 Calculus of gallbladder without cholecystitis without obstruction: Secondary | ICD-10-CM | POA: Diagnosis not present

## 2023-11-28 ENCOUNTER — Telehealth: Payer: Self-pay

## 2023-11-28 ENCOUNTER — Encounter: Payer: Self-pay | Admitting: *Deleted

## 2023-11-28 NOTE — Telephone Encounter (Signed)
 Copied from CRM [id number]. Topic: Clinical - Lab/Test Results >> Nov 28, 2023 11:41 AM Maree Krabbe H wrote: Reason for CRM: Patient called and didn't understand the results of his MRI and wanted to either speak with a nurse or Dr. Karie Schwalbe to read them off to him and for him to get a better understanding on what the results mean and he also had some questions. Patient callback number is [phone].

## 2023-11-28 NOTE — Telephone Encounter (Signed)
 Tried to call pt due to result note from MRI. Unable to reach pt. Did leave pt a VM to call the office and also sent him a mychart message of the results as well.   Pt does have an upcoming appt with Dr. Ashley Royalty 4/9. Have sent Dr. Karie Schwalbe a message to see if he might want to do casting while pt is already at the office this day.

## 2023-11-30 ENCOUNTER — Ambulatory Visit (INDEPENDENT_AMBULATORY_CARE_PROVIDER_SITE_OTHER): Admitting: Sports Medicine

## 2023-11-30 ENCOUNTER — Other Ambulatory Visit (HOSPITAL_BASED_OUTPATIENT_CLINIC_OR_DEPARTMENT_OTHER): Payer: Self-pay

## 2023-11-30 ENCOUNTER — Ambulatory Visit: Admitting: Family Medicine

## 2023-11-30 ENCOUNTER — Encounter: Payer: Self-pay | Admitting: Sports Medicine

## 2023-11-30 DIAGNOSIS — S6991XD Unspecified injury of right wrist, hand and finger(s), subsequent encounter: Secondary | ICD-10-CM

## 2023-11-30 MED ORDER — DIAZEPAM 10 MG PO TABS
10.0000 mg | ORAL_TABLET | ORAL | 0 refills | Status: DC
  Filled 2023-11-30: qty 3, 1d supply, fill #0

## 2023-11-30 NOTE — Telephone Encounter (Signed)
 Patient has already been seen in office and discussed MRI results with provider.

## 2023-11-30 NOTE — Progress Notes (Signed)
    Procedures performed today:    Short arm cast applied.  Independent interpretation of notes and tests performed by another provider:   None.  Brief History, Exam, Impression, and Recommendations:    Right wrist injury Pleasant 58 year old male, fall onto an outstretched hand sometime ago, lots of pain for months in spite of conservative treatment, ultimately MRI did show multiple pathologic findings including DRUJ disruption, multiple intercarpal ligamentous disruption of the ulnar aspect, TFCC tearing. We will do cast immobilization for about 6 weeks followed by some therapy before considering surgical referral.    ____________________________________________ Ihor Austin. Benjamin Stain, M.D., ABFM., CAQSM., AME. Primary Care and Sports Medicine [facility]  Adjunct Professor of Family Medicine  [employer] [employer] [facility] [employer]  Restaurant manager, fast food

## 2023-11-30 NOTE — Assessment & Plan Note (Signed)
 Pleasant 58 year old male, fall onto an outstretched hand sometime ago, lots of pain for months in spite of conservative treatment, ultimately MRI did show multiple pathologic findings including DRUJ disruption, multiple intercarpal ligamentous disruption of the ulnar aspect, TFCC tearing. We will do cast immobilization for about 6 weeks followed by some therapy before considering surgical referral.

## 2023-12-01 ENCOUNTER — Encounter: Payer: Self-pay | Admitting: Sports Medicine

## 2023-12-01 ENCOUNTER — Ambulatory Visit (INDEPENDENT_AMBULATORY_CARE_PROVIDER_SITE_OTHER): Admitting: Sports Medicine

## 2023-12-01 ENCOUNTER — Other Ambulatory Visit: Payer: Self-pay | Admitting: Family Medicine

## 2023-12-01 DIAGNOSIS — S6991XD Unspecified injury of right wrist, hand and finger(s), subsequent encounter: Secondary | ICD-10-CM

## 2023-12-01 DIAGNOSIS — K7469 Other cirrhosis of liver: Secondary | ICD-10-CM

## 2023-12-01 DIAGNOSIS — D649 Anemia, unspecified: Secondary | ICD-10-CM

## 2023-12-01 NOTE — Progress Notes (Signed)
    Procedures performed today:    None.  Independent interpretation of notes and tests performed by another provider:   None.  Brief History, Exam, Impression, and Recommendations:    Right wrist injury Pleasant 58 year old male, fall onto an outstretched hand sometime ago, lots of pain for months in spite of conservative treatment, ultimately MRI did show multiple pathologic findings including DRUJ disruption, multiple intercarpal ligamentous disruption of the ulnar aspect, TFCC tearing. We will do cast immobilization for about 6 weeks followed by some therapy before considering surgical referral.  Update 12/01/2023: Juan [name] returns, the cast was a bit tight around the mid hand, I have valved it partially and trimmed off some of the fiberglass around his thumb and it was much more comfortable.    ____________________________________________ Juan [name]. Juan [name], M.D., ABFM., CAQSM., AME. Primary Care and Sports Medicine [facility] [facility]  Adjunct Professor of Family Medicine  [employer] [employer] [facility] [employer]  Restaurant manager, fast food

## 2023-12-01 NOTE — Assessment & Plan Note (Signed)
 Pleasant 58 year old male, fall onto an outstretched hand sometime ago, lots of pain for months in spite of conservative treatment, ultimately MRI did show multiple pathologic findings including DRUJ disruption, multiple intercarpal ligamentous disruption of the ulnar aspect, TFCC tearing. We will do cast immobilization for about 6 weeks followed by some therapy before considering surgical referral.  Update 12/01/2023: Juan [name] returns, the cast was a bit tight around the mid hand, I have valved it partially and trimmed off some of the fiberglass around his thumb and it was much more comfortable.

## 2023-12-12 ENCOUNTER — Ambulatory Visit: Admitting: Sports Medicine

## 2023-12-13 ENCOUNTER — Inpatient Hospital Stay: Admitting: Internal Medicine

## 2023-12-20 ENCOUNTER — Inpatient Hospital Stay: Admitting: Infectious Diseases

## 2023-12-20 ENCOUNTER — Ambulatory Visit: Admitting: Sports Medicine

## 2023-12-21 ENCOUNTER — Ambulatory Visit: Payer: Medicare Other | Admitting: Internal Medicine

## 2023-12-23 ENCOUNTER — Encounter: Payer: Self-pay | Admitting: Sports Medicine

## 2023-12-23 ENCOUNTER — Ambulatory Visit (INDEPENDENT_AMBULATORY_CARE_PROVIDER_SITE_OTHER): Admitting: Sports Medicine

## 2023-12-23 DIAGNOSIS — S6991XD Unspecified injury of right wrist, hand and finger(s), subsequent encounter: Secondary | ICD-10-CM | POA: Diagnosis not present

## 2023-12-23 NOTE — Progress Notes (Signed)
    Procedures performed today:    Today we applied a short arm Exos cast  Independent interpretation of notes and tests performed by another provider:   None.  Brief History, Exam, Impression, and Recommendations:    Right wrist injury Very pleasant 58 year old male, he had a fall onto an outstretched hand sometime ago, many months of pain, failed conservative treatment, ultimately MRI did show multiple pathologic findings including DRUJ disruption, multiple intercarpal ligamentous disruption on the ulnar aspect, TFCC tearing, he has now had about 3 weeks of cast immobilization, we will switch the cast to an EXOS today. We will keep this on for another 3 weeks or so.    ____________________________________________ Joselyn Nicely. Sandy Crumb, M.D., ABFM., CAQSM., AME. Primary Care and Sports Medicine [facility]  Adjunct Professor of [facility] Medicine  [employer]  Restaurant manager, fast food

## 2023-12-23 NOTE — Assessment & Plan Note (Signed)
 Very pleasant 58 year old male, he had a fall onto an outstretched hand sometime ago, many months of pain, failed conservative treatment, ultimately MRI did show multiple pathologic findings including DRUJ disruption, multiple intercarpal ligamentous disruption on the ulnar aspect, TFCC tearing, he has now had about 3 weeks of cast immobilization, we will switch the cast to an EXOS today. We will keep this on for another 3 weeks or so.

## 2023-12-30 ENCOUNTER — Encounter: Payer: Self-pay | Admitting: Family Medicine

## 2023-12-30 ENCOUNTER — Inpatient Hospital Stay: Admitting: Infectious Diseases

## 2024-01-10 DIAGNOSIS — D649 Anemia, unspecified: Secondary | ICD-10-CM | POA: Diagnosis not present

## 2024-01-10 DIAGNOSIS — K7469 Other cirrhosis of liver: Secondary | ICD-10-CM | POA: Diagnosis not present

## 2024-01-11 ENCOUNTER — Ambulatory Visit: Payer: Self-pay | Admitting: Family Medicine

## 2024-01-13 ENCOUNTER — Encounter: Payer: Self-pay | Admitting: Sports Medicine

## 2024-01-13 ENCOUNTER — Ambulatory Visit (INDEPENDENT_AMBULATORY_CARE_PROVIDER_SITE_OTHER): Admitting: Sports Medicine

## 2024-01-13 DIAGNOSIS — M1812 Unilateral primary osteoarthritis of first carpometacarpal joint, left hand: Secondary | ICD-10-CM | POA: Diagnosis not present

## 2024-01-13 DIAGNOSIS — S6991XD Unspecified injury of right wrist, hand and finger(s), subsequent encounter: Secondary | ICD-10-CM | POA: Diagnosis not present

## 2024-01-13 NOTE — Assessment & Plan Note (Signed)
 Juan [name] has multifactorial left hand and wrist pain, he is status post CMC arthroplasty, continued pain along after surgery, we did an injection into the STT joint in February, he is pain-free left STT.

## 2024-01-13 NOTE — Assessment & Plan Note (Addendum)
 Unfortunately Juan [name] continues to have pain right hand after a fall onto an outstretched hand sometime ago. We have tried multiple conservative measures, including casting, splinting, conditioning. MRI ultimately showed multiple pathologic findings including DRUJ disruption, multiple intercarpal ligament disruptions, TFCC tearing. 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 I am considering TFCC injection however I would like consultation with hand surgery first. Of note Juan [name] does have liver cirrhosis, he has thrombocytopenia with platelets that typically fluctuate between 20,000 and 100,000, most recently around 40,000. He is not having any spontaneous bleeding. INR is 1.8 today. I would like hand surgery to weigh in.

## 2024-01-13 NOTE — Progress Notes (Signed)
    Procedures performed today:    None.  Independent interpretation of notes and tests performed by another provider:   None.  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  Right wrist injury Unfortunately Paulino continues to have pain right hand after a fall onto an outstretched hand sometime ago. We have tried multiple conservative measures, including casting, splinting, conditioning. MRI ultimately showed multiple pathologic findings including DRUJ disruption, multiple intercarpal ligament disruptions, TFCC tearing. 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 I am considering TFCC injection however I would like consultation with hand surgery first. Of note Shine does have liver cirrhosis, he has thrombocytopenia with platelets that typically fluctuate between 20,000 and 100,000, most recently around 40,000. He is not having any spontaneous bleeding. INR is 1.8 today. I would like hand surgery to weigh in.    ____________________________________________ Joselyn Nicely. Sandy Crumb, M.D., ABFM., CAQSM., AME. Primary Care and Sports Medicine [place] [facility]  Adjunct Professor of [facility] Medicine  [employer]  [employer]  Restaurant manager, fast food

## 2024-01-15 LAB — CBC WITH DIFFERENTIAL/PLATELET
Basophils Absolute: 0 10*3/uL (ref 0.0–0.2)
Basos: 1 %
EOS (ABSOLUTE): 0.2 10*3/uL (ref 0.0–0.4)
Eos: 3 %
Hematocrit: 24.6 % — ABNORMAL LOW (ref 37.5–51.0)
Hemoglobin: 8.1 g/dL — ABNORMAL LOW (ref 13.0–17.7)
Immature Grans (Abs): 0 10*3/uL (ref 0.0–0.1)
Immature Granulocytes: 0 %
Lymphocytes Absolute: 1.1 10*3/uL (ref 0.7–3.1)
Lymphs: 21 %
MCH: 25.2 pg — ABNORMAL LOW (ref 26.6–33.0)
MCHC: 32.9 g/dL (ref 31.5–35.7)
MCV: 77 fL — ABNORMAL LOW (ref 79–97)
Monocytes Absolute: 0.8 10*3/uL (ref 0.1–0.9)
Monocytes: 15 %
Neutrophils Absolute: 3 10*3/uL (ref 1.4–7.0)
Neutrophils: 60 %
Platelets: 42 10*3/uL — CL (ref 150–450)
RBC: 3.21 x10E6/uL — ABNORMAL LOW (ref 4.14–5.80)
RDW: 22.6 % — ABNORMAL HIGH (ref 11.6–15.4)
WBC: 5.1 10*3/uL (ref 3.4–10.8)

## 2024-01-15 LAB — IRON,TIBC AND FERRITIN PANEL
Ferritin: 42 ng/mL (ref 30–400)
Iron Saturation: 10 % — ABNORMAL LOW (ref 15–55)
Iron: 30 ug/dL — ABNORMAL LOW (ref 38–169)
Total Iron Binding Capacity: 300 ug/dL (ref 250–450)
UIBC: 270 ug/dL (ref 111–343)

## 2024-01-15 LAB — PREALBUMIN: PREALBUMIN: 5 mg/dL — ABNORMAL LOW (ref 10–36)

## 2024-01-15 LAB — VITAMIN B1: Thiamine: 74.6 nmol/L (ref 66.5–200.0)

## 2024-01-15 LAB — B12 AND FOLATE PANEL
Folate: 12.2 ng/mL (ref >3.0)
Vitamin B-12: >2000 pg/mL — ABNORMAL HIGH (ref 232–1245)

## 2024-01-17 ENCOUNTER — Other Ambulatory Visit: Payer: Self-pay | Admitting: Family Medicine

## 2024-01-18 ENCOUNTER — Other Ambulatory Visit (HOSPITAL_BASED_OUTPATIENT_CLINIC_OR_DEPARTMENT_OTHER): Payer: Self-pay

## 2024-01-18 ENCOUNTER — Other Ambulatory Visit (HOSPITAL_COMMUNITY): Payer: Self-pay

## 2024-01-18 MED ORDER — QUETIAPINE FUMARATE 50 MG PO TABS
25.0000 mg | ORAL_TABLET | Freq: Every day | ORAL | 1 refills | Status: DC
  Filled 2024-01-18: qty 90, 90d supply, fill #0
  Filled 2024-05-04: qty 90, 90d supply, fill #1

## 2024-01-23 DIAGNOSIS — F1020 Alcohol dependence, uncomplicated: Secondary | ICD-10-CM | POA: Diagnosis not present

## 2024-01-23 DIAGNOSIS — R262 Difficulty in walking, not elsewhere classified: Secondary | ICD-10-CM | POA: Diagnosis not present

## 2024-01-23 DIAGNOSIS — D6959 Other secondary thrombocytopenia: Secondary | ICD-10-CM | POA: Insufficient documentation

## 2024-01-23 DIAGNOSIS — M25531 Pain in right wrist: Secondary | ICD-10-CM | POA: Insufficient documentation

## 2024-01-23 DIAGNOSIS — G621 Alcoholic polyneuropathy: Secondary | ICD-10-CM

## 2024-01-23 DIAGNOSIS — D696 Thrombocytopenia, unspecified: Secondary | ICD-10-CM | POA: Diagnosis not present

## 2024-01-23 DIAGNOSIS — S63591A Other specified sprain of right wrist, initial encounter: Secondary | ICD-10-CM | POA: Diagnosis not present

## 2024-01-23 HISTORY — DX: Alcoholic polyneuropathy: G62.1

## 2024-01-23 HISTORY — DX: Other secondary thrombocytopenia: D69.59

## 2024-01-23 HISTORY — DX: Pain in right wrist: M25.531

## 2024-02-13 ENCOUNTER — Other Ambulatory Visit: Payer: Self-pay | Admitting: Family Medicine

## 2024-02-13 DIAGNOSIS — B955 Unspecified streptococcus as the cause of diseases classified elsewhere: Secondary | ICD-10-CM

## 2024-02-13 NOTE — Progress Notes (Signed)
 Recent admission in [place]  for sepsis.  Blood culture x2 with Strep Parasanguinis.

## 2024-02-15 ENCOUNTER — Ambulatory Visit (INDEPENDENT_AMBULATORY_CARE_PROVIDER_SITE_OTHER): Admitting: Family Medicine

## 2024-02-15 ENCOUNTER — Encounter: Payer: Self-pay | Admitting: Family Medicine

## 2024-02-15 ENCOUNTER — Other Ambulatory Visit (HOSPITAL_BASED_OUTPATIENT_CLINIC_OR_DEPARTMENT_OTHER): Payer: Self-pay

## 2024-02-15 VITALS — BP 105/60 | HR 79 | Ht 65.0 in | Wt 196.0 lb

## 2024-02-15 DIAGNOSIS — I159 Secondary hypertension, unspecified: Secondary | ICD-10-CM | POA: Diagnosis not present

## 2024-02-15 DIAGNOSIS — K7031 Alcoholic cirrhosis of liver with ascites: Secondary | ICD-10-CM

## 2024-02-15 DIAGNOSIS — I10 Essential (primary) hypertension: Secondary | ICD-10-CM | POA: Diagnosis not present

## 2024-02-15 DIAGNOSIS — A408 Other streptococcal sepsis: Secondary | ICD-10-CM

## 2024-02-15 DIAGNOSIS — A419 Sepsis, unspecified organism: Secondary | ICD-10-CM

## 2024-02-15 HISTORY — DX: Sepsis, unspecified organism: A41.9

## 2024-02-15 MED ORDER — SPIRONOLACTONE 100 MG PO TABS
100.0000 mg | ORAL_TABLET | Freq: Every day | ORAL | 1 refills | Status: DC
  Filled 2024-02-15: qty 90, 90d supply, fill #0

## 2024-02-15 MED ORDER — CEFDINIR 300 MG PO CAPS
300.0000 mg | ORAL_CAPSULE | Freq: Two times a day (BID) | ORAL | 0 refills | Status: AC
  Filled 2024-02-15: qty 28, 14d supply, fill #0

## 2024-02-15 NOTE — Progress Notes (Signed)
 Juan [name] - 58 y.o. male MRN [id number]  Date of birth: August 01, 1966  Subjective Chief Complaint  Patient presents with   Hospitalization Follow-up    HPI Juan [name] is a 58 y.o. male here today for hospital follow-up.  He was vacationing with his wife in [place]  over the weekend when he developed fevers of 103.  He had chills, fatigue and malaise.  He was seen in the ED at [facility].  He had imaging and labs.  No source of infection identified however he was given IV antibiotics and fluid per sepsis protocol and discharged with p.o. cefdinir.  Received a call earlier this week that his blood cultures were growing strep parasanguinis x 2 bottles.  I did go ahead and place referral to ID at that time and he has an appointment in July.  He does report that he is feeling better overall.  Still with some fatigue.  He has also had some increased swelling in his lower extremities as well as around his abdomen.  He denies any abdominal pain, headaches or respiratory symptoms.  ROS:  A comprehensive ROS was completed and negative except as noted per HPI  Allergies  Allergen Reactions   Apple Juice Swelling and Other (See Comments)    Tongue swelling   Cucumber Extract Itching and Nausea And Vomiting    No extracts; just cucumber   Depakote Er [Divalproex Sodium Er] Swelling and Other (See Comments)    Tongue swelling   Depakote [Valproic Acid] Anaphylaxis   Peanut Butter Flavoring Agent (Non-Screening) Anaphylaxis and Swelling   Peanut Oil Swelling   Peanut-Containing Drug Products Swelling   Shellfish Allergy Itching and Swelling   Shrimp Extract Itching and Swelling   Apple Swelling   Cantaloupe Extract Allergy Skin Test Rash   Codeine Itching, Rash and Other (See Comments)    Patient reports he can take CODONES without problems   Firvanq [Vancomycin] Rash   Lactose Intolerance (Gi) Diarrhea and Other (See Comments)    Indigestion, Stomach pain, Flatulence     Past Medical History:  Diagnosis Date   Acute bacterial sinusitis 11/15/2022   Acute pain of right knee 03/10/2023   AKI (acute kidney injury) (HCC) 09/11/2023   Alcohol abuse 01/11/2022   Alcohol addiction (HCC)    Alcohol use disorder 05/23/2023   Alcoholic cirrhosis of liver with ascites (HCC) 02/17/2022   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  Chronic GERD with HH  09/2019--EGD--[facility], [facility]--showed no varices; gr D esophagitis;  Rx: PPI daily     Eustachian tube dysfunction 03/02/2023   Febrile illness 06/27/2023   Fracture of laryngeal cartilage (HCC)  01/17/2020   Last Assessment & Plan:   Formatting of this note might be different from the original.  Emergency department follow-up for evaluation of laryngeal trauma.  CT obtained the night of the injury is reviewed independently and shows nondisplaced fracture of the thyroid  cartilage.  In general, his voice is much improved from the night of the trauma.  Denies any difficulty breathing.  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 07/11/2020   Lower extremity edema 09/06/2022   Lumbar degenerative disc disease 07/26/2022   Malnutrition of moderate degree 01/11/2022   MDD (major depressive disorder), recurrent severe, 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   Nonrheumatic aortic valve stenosis    Normocytic anemia 03/02/2022   Pain of left calf 04/11/2023   Pancytopenia (HCC) 09/18/2021   Portal hypertensive gastropathy (HCC)    Post-traumatic osteoarthritis of right knee 01/04/2023   Prolonged QT interval 05/05/2022   PTSD (post-traumatic stress disorder)    Rib pain on right side 10/31/2022   Right ankle sprain 02/12/2021   Right lower quadrant pain 04/17/2021   Right wrist injury 10/18/2023   Shoulder pain, left, posterior 12/22/2021   Systolic murmur 01/06/2021   Thrombocytopenia (HCC) 11/02/2020   Tibialis posterior tendinitis, right 04/14/2021   Tinea pedis 05/12/2021   Tobacco chew use 03/02/2022   Traumatic hemorrhagic shock (HCC)    Urinary frequency 03/23/2021   Well adult exam 01/06/2021    Past Surgical History:  Procedure Laterality Date   BIOPSY  09/24/2021   Procedure: BIOPSY;  Surgeon: Federico Rosario BROCKS, MD;  Location: [facility] ENDOSCOPY;  Service: Gastroenterology;;   ORIN MEDIATE RELEASE Bilateral    COLONOSCOPY WITH PROPOFOL  N/A 09/24/2021   Procedure: COLONOSCOPY WITH PROPOFOL ;  Surgeon: Federico Rosario BROCKS, MD;  Location: [facility] ENDOSCOPY;  Service:  Gastroenterology;  Laterality: N/A;   ESOPHAGOGASTRODUODENOSCOPY (EGD) WITH PROPOFOL  N/A 09/24/2021   Procedure: ESOPHAGOGASTRODUODENOSCOPY (EGD) WITH PROPOFOL ;  Surgeon: Federico Rosario BROCKS, MD;  Location: [facility] ENDOSCOPY;  Service: Gastroenterology;  Laterality: N/A;   ESOPHAGOGASTRODUODENOSCOPY (EGD) WITH PROPOFOL  N/A 08/18/2022   Procedure: ESOPHAGOGASTRODUODENOSCOPY (EGD) WITH PROPOFOL ;  Surgeon: Abran Norleen SAILOR, MD;  Location: [facility] ENDOSCOPY;  Service: Gastroenterology;  Laterality: N/A;   FLEXIBLE SIGMOIDOSCOPY N/A 08/18/2022   Procedure: FLEXIBLE SIGMOIDOSCOPY;  Surgeon: Abran Norleen SAILOR, MD;  Location: [facility] ENDOSCOPY;  Service: Gastroenterology;  Laterality: N/A;   FRACTURE SURGERY     left ankle plate   HERNIA REPAIR     inguinal   KNEE SURGERY Right    x 4   SHOULDER SURGERY Bilateral    x 2   VASECTOMY      Social History   Socioeconomic History   Marital status: Married    Spouse name: [name]   Number of children: 2   Years of education: Not on file   Highest education level: 12th grade  Occupational History    Comment: disability  Tobacco Use  Smoking status: Never   Smokeless tobacco: Current    Types: Snuff  Vaping Use   Vaping status: Never Used  Substance and Sexual Activity   Alcohol use: Not Currently   Drug use: Never   Sexual activity: Yes    Partners: Female  Other Topics Concern   Not on file  Social History Narrative   Lives with wife   Social Drivers of Health   Financial Resource Strain: Low Risk  (10/18/2023)   Overall Financial Resource Strain (CARDIA)    Difficulty of Paying Living Expenses: Not hard at all  Food Insecurity: No Food Insecurity (10/18/2023)   Hunger Vital Sign    Worried About Running Out of Food in the Last Year: Never true    Ran Out of Food in the Last Year: Never true  Transportation Needs: No Transportation Needs (10/18/2023)   PRAPARE - Administrator, Civil Service (Medical): No    Lack of Transportation  (Non-Medical): No  Physical Activity: Insufficiently Active (10/18/2023)   Exercise Vital Sign    Days of Exercise per Week: 2 days    Minutes of Exercise per Session: 60 min  Stress: Stress Concern Present (01/09/2023)   [employer] of Occupational Health - Occupational Stress Questionnaire    Feeling of Stress : To some extent  Social Connections: Unknown (10/18/2023)   Social Connection and Isolation Panel    Frequency of Communication with Friends and Family: More than three times a week    Frequency of Social Gatherings with Friends and Family: Once a week    Attends Religious Services: More than 4 times per year    Active Member of Clubs or Organizations: Patient declined    Attends Banker Meetings: Patient declined    Marital Status: Married    Family History  Problem Relation Age of Onset   Pulmonary fibrosis Mother    Hypertension Father    Other Father        liver failure   Diabetes Brother    Breast cancer Paternal Aunt    Colon cancer Neg Hx    Esophageal cancer Neg Hx    Stomach cancer Neg Hx    Rectal cancer Neg Hx     Health Maintenance  Topic Date Due   Medicare Annual Wellness (AWV)  Never done   Hepatitis B Vaccines (2 of 3 - 19+ 3-dose series) 12/02/2022   COVID-19 Vaccine (4 - 2024-25 season) 04/17/2024 (Originally 04/24/2023)   INFLUENZA VACCINE  03/23/2024   DTaP/Tdap/Td (3 - Td or Tdap) 06/20/2029   Colonoscopy  09/25/2031   Pneumococcal Vaccine 45-20 Years old  Completed   Hepatitis C Screening  Completed   HIV Screening  Completed   Zoster Vaccines- Shingrix  Completed   HPV VACCINES  Aged Out   Meningococcal B Vaccine  Aged Out     ----------------------------------------------------------------------------------------------------------------------------------------------------------------------------------------------------------------- Physical Exam BP 105/60 (BP Location: Left Arm, Patient Position: Sitting, Cuff Size:  Normal)   Pulse 79   Ht 5' 5 (1.651 m)   Wt 196 lb (88.9 kg)   SpO2 97%   BMI 32.62 kg/m   Physical Exam Constitutional:      Appearance: Normal appearance.   Eyes:     General: No scleral icterus.   Cardiovascular:     Rate and Rhythm: Normal rate and regular rhythm.     Heart sounds: Murmur (3-6 systolic) heard.  Abdominal:     Palpations: Abdomen is soft.     Tenderness:  There is no abdominal tenderness. There is no guarding.   Musculoskeletal:     Comments: 2+ edema bilaterally lower extremities   Neurological:     General: No focal deficit present.     Mental Status: He is alert.   Psychiatric:        Mood and Affect: Mood normal.        Behavior: Behavior normal.     ------------------------------------------------------------------------------------------------------------------------------------------------------------------------------------------------------------------- Assessment and Plan  Sepsis (HCC) Recent blood culture from outside hospital positive x 2.  He has been referred to ID.  Given type of organism that he grew out suspect this came from oropharyngeal or gastrointestinal tract.  Question need for SBP prophylaxis.  He will continue Omnicef for now until he sees ID.  Rechecking labs today. =  Alcoholic cirrhosis of liver with ascites (HCC) He does have volume overload.  Adding Lasix  back on with potassium daily.  Will also add Aldactone  back on as well.  Referral to hepatology as he has stopped going to [facility] due to distance.  Reiterated the importance of abstaining from alcohol.  Reports last drink was 1 month ago.  Plan for follow-up in 1 week.   Meds ordered this encounter  Medications   cefdinir (OMNICEF) 300 MG capsule    Sig: Take 1 capsule (300 mg total) by mouth 2 (two) times daily for 14 days.    Dispense:  28 capsule    Refill:  0   spironolactone  (ALDACTONE ) 100 MG tablet    Sig: Take 1 tablet (100 mg total) by mouth daily.     Dispense:  90 tablet    Refill:  1    Return in about 1 week (around 02/22/2024) for f/u sepsis.

## 2024-02-15 NOTE — Assessment & Plan Note (Signed)
 Recent blood culture from outside hospital positive x 2.  He has been referred to ID.  Given type of organism that he grew out suspect this came from oropharyngeal or gastrointestinal tract.  Question need for SBP prophylaxis.  He will continue Omnicef for now until he sees ID.  Rechecking labs today. =

## 2024-02-15 NOTE — Assessment & Plan Note (Addendum)
 He does have volume overload.  Adding Lasix  back on with potassium daily.  Will also add Aldactone  back on as well.  Referral to hepatology as he has stopped going to [facility] due to distance.  Reiterated the importance of abstaining from alcohol.  Reports last drink was 1 month ago.  Plan for follow-up in 1 week.

## 2024-02-15 NOTE — Patient Instructions (Signed)
 Resume spironolactone .  Resume lasix  40mg  daily x5 days.  Give potassium with this.  See me again in 1 week.

## 2024-02-18 LAB — BASIC METABOLIC PANEL WITH GFR
BUN/Creatinine Ratio: 7 — ABNORMAL LOW (ref 9–20)
BUN: 6 mg/dL (ref 6–24)
CO2: 18 mmol/L — ABNORMAL LOW (ref 20–29)
Calcium: 7.2 mg/dL — ABNORMAL LOW (ref 8.7–10.2)
Chloride: 105 mmol/L (ref 96–106)
Creatinine, Ser: 0.92 mg/dL (ref 0.76–1.27)
Glucose: 80 mg/dL (ref 70–99)
Potassium: 3.4 mmol/L — ABNORMAL LOW (ref 3.5–5.2)
Sodium: 138 mmol/L (ref 134–144)
eGFR: 98 mL/min/{1.73_m2} (ref >59)

## 2024-02-18 LAB — CBC WITH DIFFERENTIAL/PLATELET
Basophils Absolute: 0.1 10*3/uL (ref 0.0–0.2)
Basos: 2 %
EOS (ABSOLUTE): 0.3 10*3/uL (ref 0.0–0.4)
Eos: 5 %
Hematocrit: 22.6 % — ABNORMAL LOW (ref 37.5–51.0)
Hemoglobin: 7.3 g/dL — ABNORMAL LOW (ref 13.0–17.7)
Immature Grans (Abs): 0 10*3/uL (ref 0.0–0.1)
Immature Granulocytes: 0 %
Lymphocytes Absolute: 1.6 10*3/uL (ref 0.7–3.1)
Lymphs: 32 %
MCH: 26 pg — ABNORMAL LOW (ref 26.6–33.0)
MCHC: 32.3 g/dL (ref 31.5–35.7)
MCV: 80 fL (ref 79–97)
Monocytes Absolute: 1.1 10*3/uL — ABNORMAL HIGH (ref 0.1–0.9)
Monocytes: 22 %
Neutrophils Absolute: 1.9 10*3/uL (ref 1.4–7.0)
Neutrophils: 39 %
Platelets: 90 10*3/uL — CL (ref 150–450)
RBC: 2.81 x10E6/uL — ABNORMAL LOW (ref 4.14–5.80)
RDW: 21 % — ABNORMAL HIGH (ref 11.6–15.4)
WBC: 5 10*3/uL (ref 3.4–10.8)

## 2024-02-18 LAB — HEPATIC FUNCTION PANEL
ALT: 32 IU/L (ref 0–44)
AST: 86 IU/L — ABNORMAL HIGH (ref 0–40)
Albumin: 2.7 g/dL — ABNORMAL LOW (ref 3.8–4.9)
Alkaline Phosphatase: 107 IU/L (ref 44–121)
Bilirubin Total: 3.4 mg/dL — ABNORMAL HIGH (ref 0.0–1.2)
Bilirubin, Direct: 2.24 mg/dL — ABNORMAL HIGH (ref 0.00–0.40)
Total Protein: 5.1 g/dL — ABNORMAL LOW (ref 6.0–8.5)

## 2024-02-18 LAB — VITAMIN B12: Vitamin B-12: >2000 pg/mL — ABNORMAL HIGH (ref 232–1245)

## 2024-02-18 LAB — VITAMIN B1: Thiamine: 68.3 nmol/L (ref 66.5–200.0)

## 2024-02-18 LAB — MAGNESIUM: Magnesium: 1.3 mg/dL — ABNORMAL LOW (ref 1.6–2.3)

## 2024-02-18 LAB — AMMONIA: Ammonia: 110 ug/dL (ref 40–200)

## 2024-02-20 ENCOUNTER — Other Ambulatory Visit: Payer: Self-pay

## 2024-02-20 ENCOUNTER — Emergency Department (HOSPITAL_BASED_OUTPATIENT_CLINIC_OR_DEPARTMENT_OTHER)
Admission: EM | Admit: 2024-02-20 | Discharge: 2024-02-21 | Disposition: A | Discharge status: Home / Self Care | Attending: Emergency Medicine | Admitting: Emergency Medicine

## 2024-02-20 ENCOUNTER — Encounter (HOSPITAL_BASED_OUTPATIENT_CLINIC_OR_DEPARTMENT_OTHER): Payer: Self-pay | Admitting: Emergency Medicine

## 2024-02-20 ENCOUNTER — Emergency Department (HOSPITAL_BASED_OUTPATIENT_CLINIC_OR_DEPARTMENT_OTHER)

## 2024-02-20 DIAGNOSIS — R112 Nausea with vomiting, unspecified: Secondary | ICD-10-CM | POA: Diagnosis not present

## 2024-02-20 DIAGNOSIS — E876 Hypokalemia: Secondary | ICD-10-CM | POA: Insufficient documentation

## 2024-02-20 DIAGNOSIS — D696 Thrombocytopenia, unspecified: Secondary | ICD-10-CM | POA: Insufficient documentation

## 2024-02-20 DIAGNOSIS — Z720 Tobacco use: Secondary | ICD-10-CM | POA: Diagnosis not present

## 2024-02-20 DIAGNOSIS — D649 Anemia, unspecified: Secondary | ICD-10-CM | POA: Insufficient documentation

## 2024-02-20 DIAGNOSIS — R197 Diarrhea, unspecified: Secondary | ICD-10-CM | POA: Insufficient documentation

## 2024-02-20 DIAGNOSIS — R600 Localized edema: Secondary | ICD-10-CM | POA: Insufficient documentation

## 2024-02-20 DIAGNOSIS — I10 Essential (primary) hypertension: Secondary | ICD-10-CM | POA: Insufficient documentation

## 2024-02-20 DIAGNOSIS — R39198 Other difficulties with micturition: Secondary | ICD-10-CM | POA: Insufficient documentation

## 2024-02-20 DIAGNOSIS — M7989 Other specified soft tissue disorders: Secondary | ICD-10-CM | POA: Diagnosis present

## 2024-02-20 LAB — CBC
HCT: 24.1 % — ABNORMAL LOW (ref 39.0–52.0)
Hemoglobin: 7.7 g/dL — ABNORMAL LOW (ref 13.0–17.0)
MCH: 25.9 pg — ABNORMAL LOW (ref 26.0–34.0)
MCHC: 32 g/dL (ref 30.0–36.0)
MCV: 81.1 fL (ref 80.0–100.0)
Platelets: 93 10*3/uL — ABNORMAL LOW (ref 150–400)
RBC: 2.97 MIL/uL — ABNORMAL LOW (ref 4.22–5.81)
RDW: 22.9 % — ABNORMAL HIGH (ref 11.5–15.5)
WBC: 6.9 10*3/uL (ref 4.0–10.5)
nRBC: 0 % (ref 0.0–0.2)

## 2024-02-20 LAB — URINALYSIS, W/ REFLEX TO CULTURE (INFECTION SUSPECTED)
Bilirubin Urine: NEGATIVE
Glucose, UA: NEGATIVE mg/dL
Ketones, ur: NEGATIVE mg/dL
Leukocytes,Ua: NEGATIVE
Nitrite: NEGATIVE
Protein, ur: NEGATIVE mg/dL
Specific Gravity, Urine: 1.015 (ref 1.005–1.030)
pH: 8 (ref 5.0–8.0)

## 2024-02-20 LAB — BASIC METABOLIC PANEL WITH GFR
Anion gap: 14 (ref 5–15)
BUN: 8 mg/dL (ref 6–20)
CO2: 20 mmol/L — ABNORMAL LOW (ref 22–32)
Calcium: 8.1 mg/dL — ABNORMAL LOW (ref 8.9–10.3)
Chloride: 105 mmol/L (ref 98–111)
Creatinine, Ser: 0.91 mg/dL (ref 0.61–1.24)
GFR, Estimated: >60 mL/min (ref >60)
Glucose, Bld: 98 mg/dL (ref 70–99)
Potassium: 2.9 mmol/L — ABNORMAL LOW (ref 3.5–5.1)
Sodium: 139 mmol/L (ref 135–145)

## 2024-02-20 LAB — HEPATIC FUNCTION PANEL
ALT: 31 U/L (ref 0–44)
AST: 96 U/L — ABNORMAL HIGH (ref 15–41)
Albumin: 2.8 g/dL — ABNORMAL LOW (ref 3.5–5.0)
Alkaline Phosphatase: 99 U/L (ref 38–126)
Bilirubin, Direct: 2.5 mg/dL — ABNORMAL HIGH (ref 0.0–0.2)
Indirect Bilirubin: 2 mg/dL — ABNORMAL HIGH (ref 0.3–0.9)
Total Bilirubin: 4.5 mg/dL — ABNORMAL HIGH (ref 0.0–1.2)
Total Protein: 5.7 g/dL — ABNORMAL LOW (ref 6.5–8.1)

## 2024-02-20 LAB — PRO BRAIN NATRIURETIC PEPTIDE: Pro Brain Natriuretic Peptide: 229 pg/mL (ref <300.0)

## 2024-02-20 LAB — LIPASE, BLOOD: Lipase: 43 U/L (ref 11–51)

## 2024-02-20 LAB — TROPONIN T, HIGH SENSITIVITY: Troponin T High Sensitivity: 32 ng/L — ABNORMAL HIGH (ref <19)

## 2024-02-20 MED ORDER — POTASSIUM CHLORIDE CRYS ER 20 MEQ PO TBCR
40.0000 meq | EXTENDED_RELEASE_TABLET | Freq: Once | ORAL | Status: AC
  Administered 2024-02-20: 40 meq via ORAL
  Filled 2024-02-20: qty 2

## 2024-02-20 MED ORDER — FUROSEMIDE 10 MG/ML IJ SOLN
40.0000 mg | Freq: Once | INTRAMUSCULAR | Status: AC
  Administered 2024-02-20: 40 mg via INTRAVENOUS
  Filled 2024-02-20: qty 4

## 2024-02-20 MED ORDER — ONDANSETRON HCL 4 MG/2ML IJ SOLN
4.0000 mg | Freq: Once | INTRAMUSCULAR | Status: AC
  Administered 2024-02-20: 4 mg via INTRAVENOUS
  Filled 2024-02-20: qty 2

## 2024-02-20 MED ORDER — ONDANSETRON 4 MG PO TBDP
4.0000 mg | ORAL_TABLET | Freq: Three times a day (TID) | ORAL | 0 refills | Status: DC | PRN
  Filled 2024-02-20: qty 20, 7d supply, fill #0

## 2024-02-20 MED ORDER — SODIUM CHLORIDE 0.9 % IV SOLN
1.0000 g | Freq: Once | INTRAVENOUS | Status: AC
  Administered 2024-02-20: 1 g via INTRAVENOUS
  Filled 2024-02-20: qty 10

## 2024-02-20 MED ORDER — POTASSIUM CHLORIDE CRYS ER 20 MEQ PO TBCR
40.0000 meq | EXTENDED_RELEASE_TABLET | Freq: Every day | ORAL | 0 refills | Status: DC
  Filled 2024-02-20: qty 10, 5d supply, fill #0

## 2024-02-20 NOTE — ED Triage Notes (Signed)
 Pt POV in wheelchair- c/o BLLE swelling x3 days.  Also reports emesis x2 today, improved after taking zofran  today. Pt reports being febrile of 100.7 at home. Afebrile in triage x2.   Reports being in ED in [place]  last week, received information that he had + blood cultures, currently taking antibiotics for this.

## 2024-02-20 NOTE — Discharge Instructions (Signed)
 Please take your antibiotics and Lasix  as prescribed.  Keep your follow-up appointment with your primary care doctor.  Return with any new or suddenly worsening symptoms.

## 2024-02-20 NOTE — ED Provider Notes (Signed)
 Emergency Department Provider Note   I have reviewed the triage vital signs and the nursing notes.   HISTORY  Chief Complaint Leg Swelling   HPI Juan [name] is a 58 y.o. male with past history of ascites and liver cirrhosis, recent admit in [place]  for sepsis and staph bacteremia (currently on abx) presents to the emergency department with nausea/vomiting x 2 today with diarrhea.  His wife recorded a temperature earlier today of 100.7 F.  He has not been having shaking chills.  He has been having significant swelling in his legs.  He was having similar symptoms 5 days ago when he saw his PCP.  He was started on cefdinir  and referred to infectious disease.  He was also started back on his Lasix  along with his spironolactone  for the fluid overload.  The wife states that he has been intermittently compliant with both his antibiotics and Lasix .  He did not take either today.  He denies any chest pain or abdominal pain.  He took an Imodium  and his diarrhea has stopped.  His wife also notes some difficulty with urination and prior history of UTI.   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  Chronic GERD with HH  09/2019--EGD--[facility], [facility]--showed no varices; gr D esophagitis;  Rx: PPI daily     Eustachian tube dysfunction 03/02/2023   Febrile illness 06/27/2023   Fracture of laryngeal cartilage (HCC) 01/17/2020   Last Assessment & Plan:   Formatting of this note might be different from the original.  Emergency department follow-up for evaluation of laryngeal trauma.  CT obtained the night of the injury is reviewed independently and shows nondisplaced fracture of the thyroid  cartilage.  In general, his voice is much improved from the night of the trauma.  Denies any difficulty breathing.  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 07/11/2020   Lower extremity edema 09/06/2022   Lumbar degenerative disc disease 07/26/2022   Malnutrition of moderate degree 01/11/2022   MDD (major depressive disorder), recurrent severe, 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   Nonrheumatic aortic valve stenosis    Normocytic anemia 03/02/2022    Pain of left calf 04/11/2023   Pancytopenia (HCC) 09/18/2021   Portal hypertensive gastropathy (HCC)    Post-traumatic osteoarthritis of right knee 01/04/2023   Prolonged QT interval 05/05/2022   PTSD (post-traumatic stress disorder)    Rib pain on right side 10/31/2022   Right ankle sprain 02/12/2021   Right lower quadrant pain 04/17/2021   Right wrist injury 10/18/2023   Shoulder pain, left, posterior 12/22/2021   Systolic murmur 01/06/2021   Thrombocytopenia (HCC) 11/02/2020   Tibialis posterior tendinitis, right 04/14/2021   Tinea pedis 05/12/2021   Tobacco chew use 03/02/2022   Traumatic hemorrhagic shock (HCC)    Urinary frequency 03/23/2021   Well adult exam 01/06/2021    Review of Systems  Constitutional: Positive fever/chills Cardiovascular: Denies chest pain. Respiratory: Denies shortness of breath. Gastrointestinal: No abdominal pain. Positive nausea, vomiting, and diarrhea.  No constipation. Genitourinary: Negative for dysuria. Positive urine hesitancy.  Musculoskeletal: Negative for back pain. Skin: Negative for rash. Neurological: Negative for headaches.  ____________________________________________   PHYSICAL EXAM:  VITAL SIGNS: ED Triage Vitals  Encounter Vitals Group     BP 02/20/24 2155 139/89     Pulse Rate 02/20/24 2155 81     Resp 02/20/24 2155 (!) 22     Temp 02/20/24 2155 98.5 F (36.9 C)     Temp src --      SpO2 02/20/24 2155 99 %     Weight 02/20/24 2153 190 lb (86.2 kg)     Height 02/20/24 2153 5' 5 (1.651 m)   Constitutional: Alert and oriented. Well appearing and in no acute distress. Eyes: Conjunctivae are normal.  Head: Atraumatic. Nose: No congestion/rhinnorhea. Mouth/Throat: Mucous membranes are moist.   Neck: No stridor.   Cardiovascular: Normal rate, regular rhythm. Good peripheral circulation. Grossly normal heart sounds.   Respiratory: Normal respiratory effort.  No retractions. Lungs CTAB. Gastrointestinal: Soft and  nontender. Positive distention.  Musculoskeletal: No lower extremity tenderness with bilateral pitting edema. No gross deformities of extremities. Neurologic:  Normal speech and language.  Skin:  Skin is warm, dry and intact. No rash noted.  ____________________________________________   LABS (all labs ordered are listed, but only abnormal results are displayed)  Labs Reviewed  BASIC METABOLIC PANEL WITH GFR - Abnormal; Notable for the following components:      Result Value   Potassium 2.9 (*)    CO2 20 (*)    Calcium  8.1 (*)    All other components within normal limits  CBC - Abnormal; Notable for the following components:   RBC 2.97 (*)    Hemoglobin 7.7 (*)    HCT 24.1 (*)    MCH 25.9 (*)    RDW 22.9 (*)    Platelets 93 (*)    All other components within normal limits  TROPONIN T, HIGH SENSITIVITY - Abnormal; Notable for the following components:   Troponin T High Sensitivity 32 (*)    All other components within normal limits  CULTURE, BLOOD (ROUTINE X 2)  CULTURE, BLOOD (ROUTINE X 2)  C DIFFICILE QUICK SCREEN W PCR REFLEX  PRO BRAIN NATRIURETIC PEPTIDE  HEPATIC FUNCTION PANEL  LIPASE, BLOOD  URINALYSIS, W/ REFLEX TO CULTURE (INFECTION SUSPECTED)   ____________________________________________  RADIOLOGY  DG Chest 2 View Result Date: 02/20/2024 CLINICAL DATA:  Bilateral lower leg swelling x3 days. EXAM: CHEST - 2 VIEW COMPARISON:  September 25, 2023 FINDINGS: The heart size and mediastinal contours are within normal limits. Both lungs are clear. The visualized skeletal structures are unremarkable. IMPRESSION: No active cardiopulmonary disease. Electronically Signed   By: Suzen Dials M.D.   On: 02/20/2024 22:17    ____________________________________________   PROCEDURES  Procedure(s) performed:   Procedures  None  ____________________________________________   INITIAL IMPRESSION / ASSESSMENT AND PLAN / ED COURSE  Pertinent labs & imaging results  that were available during my care of the patient were reviewed by me and considered in my medical decision making (see chart for details).   This patient is Presenting for Evaluation of vomiting/dairrhea, which does require a range of treatment options, and is a complaint that involves a high risk of morbidity and mortality.  The Differential Diagnoses include sepsis, AKI, URI, c diff, etc.  Critical Interventions-    Medications  furosemide  (LASIX ) injection 40 mg (has no administration in time range)  cefTRIAXone  (ROCEPHIN ) 1 g in sodium chloride  0.9 % 100 mL IVPB (has no administration in time range)  potassium chloride  SA (KLOR-CON  M) CR tablet 40 mEq (has no administration in time range)    Reassessment after intervention: symptoms improved.    I did obtain Additional Historical Information from ***, as the patient is ***.  I decided to review pertinent External Data, and in summary ***.   Clinical Laboratory Tests Ordered, included   Radiologic Tests Ordered, included ***. I independently interpreted the images and agree with radiology interpretation.   Cardiac Monitor Tracing which shows ***   Social Determinants of Health Risk ***  Consult complete with  Medical Decision Making: Summary: ***  Reevaluation with update and discussion with   ***Considered admission***  Patient's presentation is most consistent with {EM COPA:27473}   Disposition:   ____________________________________________  FINAL CLINICAL IMPRESSION(S) / ED DIAGNOSES  Final diagnoses:  None     NEW OUTPATIENT MEDICATIONS STARTED DURING THIS VISIT:  New Prescriptions   No medications on file    Note:  This document was prepared using Dragon voice recognition software and may include unintentional dictation errors.  Fonda Law, MD, [facility]

## 2024-02-21 ENCOUNTER — Other Ambulatory Visit (HOSPITAL_BASED_OUTPATIENT_CLINIC_OR_DEPARTMENT_OTHER): Payer: Self-pay

## 2024-02-21 LAB — TROPONIN T, HIGH SENSITIVITY: Troponin T High Sensitivity: 31 ng/L — ABNORMAL HIGH (ref <19)

## 2024-02-23 ENCOUNTER — Ambulatory Visit (INDEPENDENT_AMBULATORY_CARE_PROVIDER_SITE_OTHER): Admitting: Family Medicine

## 2024-02-23 VITALS — BP 116/76 | HR 86 | Ht 65.0 in | Wt 179.0 lb

## 2024-02-23 DIAGNOSIS — I10 Essential (primary) hypertension: Secondary | ICD-10-CM

## 2024-02-23 DIAGNOSIS — K7031 Alcoholic cirrhosis of liver with ascites: Secondary | ICD-10-CM | POA: Diagnosis not present

## 2024-02-23 DIAGNOSIS — R7881 Bacteremia: Secondary | ICD-10-CM | POA: Diagnosis not present

## 2024-02-23 NOTE — Patient Instructions (Signed)
 Take spironolactone  daily.  Continue lasix  daily.  Take potassium with this.

## 2024-02-24 LAB — CBC WITH DIFFERENTIAL/PLATELET
Basophils Absolute: 0.1 x10E3/uL (ref 0.0–0.2)
Basos: 1 %
EOS (ABSOLUTE): 0.2 x10E3/uL (ref 0.0–0.4)
Eos: 3 %
Hematocrit: 24.3 % — ABNORMAL LOW (ref 37.5–51.0)
Hemoglobin: 8 g/dL — ABNORMAL LOW (ref 13.0–17.7)
Immature Grans (Abs): 0 x10E3/uL (ref 0.0–0.1)
Immature Granulocytes: 0 %
Lymphocytes Absolute: 1.2 x10E3/uL (ref 0.7–3.1)
Lymphs: 19 %
MCH: 26.9 pg (ref 26.6–33.0)
MCHC: 32.9 g/dL (ref 31.5–35.7)
MCV: 82 fL (ref 79–97)
Monocytes Absolute: 0.8 x10E3/uL (ref 0.1–0.9)
Monocytes: 13 %
Neutrophils Absolute: 4.1 x10E3/uL (ref 1.4–7.0)
Neutrophils: 64 %
Platelets: 84 x10E3/uL — CL (ref 150–450)
RBC: 2.97 x10E6/uL — ABNORMAL LOW (ref 4.14–5.80)
RDW: 20.8 % — ABNORMAL HIGH (ref 11.6–15.4)
WBC: 6.4 x10E3/uL (ref 3.4–10.8)

## 2024-02-24 LAB — BASIC METABOLIC PANEL WITH GFR
BUN/Creatinine Ratio: 12 (ref 9–20)
BUN: 13 mg/dL (ref 6–24)
CO2: 20 mmol/L (ref 20–29)
Calcium: 7.8 mg/dL — ABNORMAL LOW (ref 8.7–10.2)
Chloride: 102 mmol/L (ref 96–106)
Creatinine, Ser: 1.06 mg/dL (ref 0.76–1.27)
Glucose: 90 mg/dL (ref 70–99)
Potassium: 3.4 mmol/L — ABNORMAL LOW (ref 3.5–5.2)
Sodium: 134 mmol/L (ref 134–144)
eGFR: 82 mL/min/1.73 (ref >59)

## 2024-02-24 LAB — HEPATIC FUNCTION PANEL
ALT: 22 IU/L (ref 0–44)
AST: 62 IU/L — ABNORMAL HIGH (ref 0–40)
Albumin: 2.7 g/dL — ABNORMAL LOW (ref 3.8–4.9)
Alkaline Phosphatase: 100 IU/L (ref 44–121)
Bilirubin Total: 4.4 mg/dL — ABNORMAL HIGH (ref 0.0–1.2)
Bilirubin, Direct: 2.61 mg/dL — ABNORMAL HIGH (ref 0.00–0.40)
Total Protein: 5.7 g/dL — ABNORMAL LOW (ref 6.0–8.5)

## 2024-02-26 ENCOUNTER — Encounter: Payer: Self-pay | Admitting: Family Medicine

## 2024-02-26 DIAGNOSIS — R7881 Bacteremia: Secondary | ICD-10-CM

## 2024-02-26 HISTORY — DX: Bacteremia: R78.81

## 2024-02-26 LAB — CULTURE, BLOOD (ROUTINE X 2)
Culture: NO GROWTH
Culture: NO GROWTH
Special Requests: ADEQUATE
Special Requests: ADEQUATE

## 2024-02-26 NOTE — Progress Notes (Signed)
 Juan [name] - 58 y.o. male MRN [id number]  Date of birth: 11/17/1965  Subjective Chief Complaint  Patient presents with   Hospitalization Follow-up    HPI Juan [name] is a 58 y.o. male here today for follow up.   Recent ED visit due to increased edema, nausea and vomiting.  He had not really been taking Lasix  or Aldactone  as directed.  Given Lasix  in the ED and eliquis continued on p.o. Lasix  since discharge.  Potassium was low in the ED and was repleted.  He did also have temp at home elevated at 100.7.  He was continued on cefdinir  however has had very little complaints with this as well.  Reports he is feeling back to baseline today.  He is urinating quite a bit.  Edema has improved.  He has not had any additional fevers or chills.  Denies abdominal pain or urinary symptoms.  He has appointment with GYN next week.  ROS:  A comprehensive ROS was completed and negative except as noted per HPI  Allergies  Allergen Reactions   Apple Juice Swelling and Other (See Comments)    Tongue swelling   Cucumber Extract Itching and Nausea And Vomiting    No extracts; just cucumber   Depakote Er [Divalproex Sodium Er] Swelling and Other (See Comments)    Tongue swelling   Depakote [Valproic Acid] Anaphylaxis   Peanut Butter Flavoring Agent (Non-Screening) Anaphylaxis and Swelling   Peanut Oil Swelling   Peanut-Containing Drug Products Swelling   Shellfish Allergy Itching and Swelling   Shrimp Extract Itching and Swelling   Apple Swelling   Cantaloupe Extract Allergy Skin Test Rash   Codeine Itching, Rash and Other (See Comments)    Patient reports he can take CODONES without problems   Firvanq  [Vancomycin ] Rash   Lactose Intolerance (Gi) Diarrhea and Other (See Comments)    Indigestion, Stomach pain, Flatulence    Past Medical History:  Diagnosis Date   Acute bacterial sinusitis 11/15/2022   Acute pain of right knee 03/10/2023   AKI (acute kidney injury) (HCC) 09/11/2023    Alcohol abuse 01/11/2022   Alcohol addiction (HCC)    Alcohol use disorder 05/23/2023   Alcoholic cirrhosis of liver with ascites (HCC) 02/17/2022   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  Chronic GERD with HH  09/2019--EGD--[facility], [place]--showed no varices; gr D esophagitis;  Rx: PPI daily     Eustachian tube dysfunction 03/02/2023   Febrile illness 06/27/2023   Fracture of laryngeal cartilage (HCC) 01/17/2020   Last Assessment & Plan:   Formatting of this note might be different from the original.  Emergency department follow-up for evaluation  of laryngeal trauma.  CT obtained the night of the injury is reviewed independently and shows nondisplaced fracture of the thyroid  cartilage.  In general, his voice is much improved from the night of the trauma.  Denies any difficulty breathing.  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 07/11/2020   Lower extremity edema 09/06/2022   Lumbar degenerative disc disease 07/26/2022   Malnutrition of moderate degree 01/11/2022   MDD (major depressive disorder), recurrent severe, 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   Nonrheumatic aortic valve stenosis    Normocytic anemia 03/02/2022   Pain of left calf 04/11/2023   Pancytopenia (HCC) 09/18/2021   Portal hypertensive gastropathy (HCC)    Post-traumatic osteoarthritis of right knee 01/04/2023   Prolonged QT interval 05/05/2022   PTSD (post-traumatic stress disorder)    Rib pain on right side 10/31/2022   Right ankle sprain 02/12/2021   Right lower quadrant pain 04/17/2021   Right wrist injury 10/18/2023   Shoulder pain, left, posterior 12/22/2021   Systolic murmur 01/06/2021   Thrombocytopenia (HCC) 11/02/2020   Tibialis posterior tendinitis, right 04/14/2021   Tinea pedis 05/12/2021   Tobacco chew use 03/02/2022   Traumatic hemorrhagic shock (HCC)    Urinary frequency 03/23/2021   Well adult exam 01/06/2021    Past Surgical History:  Procedure Laterality Date   BIOPSY  09/24/2021   Procedure: BIOPSY;  Surgeon: Federico Rosario BROCKS, MD;  Location: [facility] ENDOSCOPY;  Service: Gastroenterology;;   ORIN MEDIATE RELEASE Bilateral    COLONOSCOPY WITH PROPOFOL  N/A 09/24/2021   Procedure: COLONOSCOPY WITH PROPOFOL ;  Surgeon: Federico Rosario BROCKS, MD;  Location: [facility] ENDOSCOPY;  Service: Gastroenterology;  Laterality: N/A;   ESOPHAGOGASTRODUODENOSCOPY (EGD) WITH PROPOFOL  N/A 09/24/2021   Procedure: ESOPHAGOGASTRODUODENOSCOPY (EGD) WITH PROPOFOL ;  Surgeon: Federico Rosario BROCKS,  MD;  Location: [facility] ENDOSCOPY;  Service: Gastroenterology;  Laterality: N/A;   ESOPHAGOGASTRODUODENOSCOPY (EGD) WITH PROPOFOL  N/A 08/18/2022   Procedure: ESOPHAGOGASTRODUODENOSCOPY (EGD) WITH PROPOFOL ;  Surgeon: Abran Norleen SAILOR, MD;  Location: [facility] ENDOSCOPY;  Service: Gastroenterology;  Laterality: N/A;   FLEXIBLE SIGMOIDOSCOPY N/A 08/18/2022   Procedure: FLEXIBLE SIGMOIDOSCOPY;  Surgeon: Abran Norleen SAILOR, MD;  Location: [facility] ENDOSCOPY;  Service: Gastroenterology;  Laterality: N/A;   FRACTURE SURGERY     left ankle plate   HERNIA REPAIR     inguinal   KNEE SURGERY Right    x 4   SHOULDER SURGERY Bilateral    x 2   VASECTOMY      Social History   Socioeconomic History   Marital status: Married    Spouse name: [name]   Number of children: 2   Years of education: Not on file   Highest education level: 12th grade  Occupational History    Comment: disability  Tobacco Use   Smoking status: Never   Smokeless tobacco: Current    Types: Snuff  Vaping Use   Vaping status: Never Used  Substance  and Sexual Activity   Alcohol use: Not Currently   Drug use: Never   Sexual activity: Yes    Partners: Female  Other Topics Concern   Not on file  Social History Narrative   Lives with wife   Social Drivers of Health   Financial Resource Strain: Low Risk  (10/18/2023)   Overall Financial Resource Strain (CARDIA)    Difficulty of Paying Living Expenses: Not hard at all  Food Insecurity: No Food Insecurity (10/18/2023)   Hunger Vital Sign    Worried About Running Out of Food in the Last Year: Never true    Ran Out of Food in the Last Year: Never true  Transportation Needs: No Transportation Needs (10/18/2023)   PRAPARE - Administrator, Civil Service (Medical): No    Lack of Transportation (Non-Medical): No  Physical Activity: Insufficiently Active (10/18/2023)   Exercise Vital Sign    Days of Exercise per Week: 2 days    Minutes of Exercise per Session: 60 min  Stress: Stress  Concern Present (01/09/2023)   [employer] of Occupational Health - Occupational Stress Questionnaire    Feeling of Stress : To some extent  Social Connections: Unknown (10/18/2023)   Social Connection and Isolation Panel    Frequency of Communication with Friends and Family: More than three times a week    Frequency of Social Gatherings with Friends and Family: Once a week    Attends Religious Services: More than 4 times per year    Active Member of Clubs or Organizations: Patient declined    Attends Banker Meetings: Patient declined    Marital Status: Married    Family History  Problem Relation Age of Onset   Pulmonary fibrosis Mother    Hypertension Father    Other Father        liver failure   Diabetes Brother    Breast cancer Paternal Aunt    Colon cancer Neg Hx    Esophageal cancer Neg Hx    Stomach cancer Neg Hx    Rectal cancer Neg Hx     Health Maintenance  Topic Date Due   Medicare Annual Wellness (AWV)  Never done   Hepatitis B Vaccines (2 of 3 - 19+ 3-dose series) 12/02/2022   COVID-19 Vaccine (4 - 2024-25 season) 04/17/2024 (Originally 04/24/2023)   INFLUENZA VACCINE  03/23/2024   DTaP/Tdap/Td (3 - Td or Tdap) 06/20/2029   Colonoscopy  09/25/2031   Pneumococcal Vaccine 49-76 Years old  Completed   Hepatitis C Screening  Completed   HIV Screening  Completed   Zoster Vaccines- Shingrix  Completed   HPV VACCINES  Aged Out   Meningococcal B Vaccine  Aged Out     ----------------------------------------------------------------------------------------------------------------------------------------------------------------------------------------------------------------- Physical Exam BP 116/76 (BP Location: Left Arm, Patient Position: Sitting, Cuff Size: Normal)   Pulse 86   Ht 5' 5 (1.651 m)   Wt 179 lb (81.2 kg)   SpO2 97%   BMI 29.79 kg/m   Physical Exam Constitutional:      Appearance: Normal appearance.  Eyes:     General: No  scleral icterus. Cardiovascular:     Rate and Rhythm: Normal rate and regular rhythm.  Pulmonary:     Effort: Pulmonary effort is normal.     Breath sounds: Normal breath sounds.  Abdominal:     General: There is no distension.     Palpations: Abdomen is soft.     Tenderness: There is no abdominal tenderness.  Musculoskeletal:  Comments: Trace bilateral edema  Neurological:     Mental Status: He is alert.  Psychiatric:        Mood and Affect: Mood normal.        Behavior: Behavior normal.     ------------------------------------------------------------------------------------------------------------------------------------------------------------------------------------------------------------------- Assessment and Plan  Alcoholic cirrhosis of liver with ascites (HCC) Good diuresis with Lasix  and Aldactone .  Will plan to continue.  Update electrolytes and renal function.  Bacteremia Continues on cefdinir .  He did have fever again recently but had not been taking antibiotics.  Has visit with ID later next week.   No orders of the defined types were placed in this encounter.   No follow-ups on file.

## 2024-02-26 NOTE — Assessment & Plan Note (Signed)
 Continues on cefdinir .  He did have fever again recently but had not been taking antibiotics.  Has visit with ID later next week.

## 2024-02-26 NOTE — Assessment & Plan Note (Signed)
 Good diuresis with Lasix  and Aldactone .  Will plan to continue.  Update electrolytes and renal function.

## 2024-02-27 ENCOUNTER — Emergency Department (HOSPITAL_COMMUNITY)

## 2024-02-27 ENCOUNTER — Emergency Department (HOSPITAL_BASED_OUTPATIENT_CLINIC_OR_DEPARTMENT_OTHER)
Admission: EM | Admit: 2024-02-27 | Discharge: 2024-02-28 | Disposition: A | Discharge status: Home / Self Care | Attending: Emergency Medicine | Admitting: Emergency Medicine

## 2024-02-27 ENCOUNTER — Other Ambulatory Visit: Payer: Self-pay

## 2024-02-27 ENCOUNTER — Encounter (HOSPITAL_BASED_OUTPATIENT_CLINIC_OR_DEPARTMENT_OTHER): Payer: Self-pay | Admitting: Emergency Medicine

## 2024-02-27 ENCOUNTER — Ambulatory Visit: Payer: Self-pay

## 2024-02-27 DIAGNOSIS — R42 Dizziness and giddiness: Secondary | ICD-10-CM | POA: Insufficient documentation

## 2024-02-27 DIAGNOSIS — Z79899 Other long term (current) drug therapy: Secondary | ICD-10-CM | POA: Diagnosis not present

## 2024-02-27 DIAGNOSIS — I10 Essential (primary) hypertension: Secondary | ICD-10-CM | POA: Insufficient documentation

## 2024-02-27 LAB — COMPREHENSIVE METABOLIC PANEL WITH GFR
ALT: 21 U/L (ref 0–44)
AST: 59 U/L — ABNORMAL HIGH (ref 15–41)
Albumin: 3.1 g/dL — ABNORMAL LOW (ref 3.5–5.0)
Alkaline Phosphatase: 93 U/L (ref 38–126)
Anion gap: 19 — ABNORMAL HIGH (ref 5–15)
BUN: 14 mg/dL (ref 6–20)
CO2: 12 mmol/L — ABNORMAL LOW (ref 22–32)
Calcium: 9.6 mg/dL (ref 8.9–10.3)
Chloride: 98 mmol/L (ref 98–111)
Creatinine, Ser: 1.21 mg/dL (ref 0.61–1.24)
GFR, Estimated: >60 mL/min (ref >60)
Glucose, Bld: 120 mg/dL — ABNORMAL HIGH (ref 70–99)
Potassium: 3.2 mmol/L — ABNORMAL LOW (ref 3.5–5.1)
Sodium: 130 mmol/L — ABNORMAL LOW (ref 135–145)
Total Bilirubin: 5.6 mg/dL — ABNORMAL HIGH (ref 0.0–1.2)
Total Protein: 6.3 g/dL — ABNORMAL LOW (ref 6.5–8.1)

## 2024-02-27 LAB — CBC
HCT: 26.6 % — ABNORMAL LOW (ref 39.0–52.0)
Hemoglobin: 8.6 g/dL — ABNORMAL LOW (ref 13.0–17.0)
MCH: 26.9 pg (ref 26.0–34.0)
MCHC: 32.3 g/dL (ref 30.0–36.0)
MCV: 83.1 fL (ref 80.0–100.0)
Platelets: 70 K/uL — ABNORMAL LOW (ref 150–400)
RBC: 3.2 MIL/uL — ABNORMAL LOW (ref 4.22–5.81)
RDW: 23.4 % — ABNORMAL HIGH (ref 11.5–15.5)
WBC: 13.4 K/uL — ABNORMAL HIGH (ref 4.0–10.5)
nRBC: 0 % (ref 0.0–0.2)

## 2024-02-27 LAB — URINALYSIS, ROUTINE W REFLEX MICROSCOPIC
Glucose, UA: NEGATIVE mg/dL
Hgb urine dipstick: NEGATIVE
Ketones, ur: NEGATIVE mg/dL
Leukocytes,Ua: NEGATIVE
Nitrite: NEGATIVE
Protein, ur: NEGATIVE mg/dL
Specific Gravity, Urine: 1.02 (ref 1.005–1.030)
pH: 6 (ref 5.0–8.0)

## 2024-02-27 LAB — LIPASE, BLOOD: Lipase: 11 U/L (ref 11–51)

## 2024-02-27 MED ORDER — LORAZEPAM 2 MG/ML IJ SOLN
1.0000 mg | Freq: Once | INTRAMUSCULAR | Status: AC
  Administered 2024-02-27: 1 mg via INTRAVENOUS
  Filled 2024-02-27: qty 1

## 2024-02-27 MED ORDER — POTASSIUM CHLORIDE 10 MEQ/100ML IV SOLN
10.0000 meq | Freq: Once | INTRAVENOUS | Status: AC
  Administered 2024-02-27: 10 meq via INTRAVENOUS
  Filled 2024-02-27: qty 100

## 2024-02-27 MED ORDER — ONDANSETRON HCL 4 MG/2ML IJ SOLN
4.0000 mg | Freq: Once | INTRAMUSCULAR | Status: AC
  Administered 2024-02-27: 4 mg via INTRAVENOUS
  Filled 2024-02-27: qty 2

## 2024-02-27 NOTE — ED Triage Notes (Signed)
 Pt POV c/o vertigo, emesis, dark urine today. Denies known sick contacts, fever.

## 2024-02-27 NOTE — Discharge Instructions (Signed)
 Go to the [facility] emergency room.  You have an MRI ordered to assess for posterior stroke.

## 2024-02-27 NOTE — ED Notes (Signed)
 Pt and family at bedside instructed to come co directly to [facility] ed for the requested MR.  Pt and family aware.

## 2024-02-27 NOTE — ED Notes (Signed)
Pt unable to provide urine at this time. Urinal at bedside 

## 2024-02-27 NOTE — ED Notes (Signed)
 Called patient name 3x to go back to see provider no answer

## 2024-02-27 NOTE — Telephone Encounter (Signed)
 Patient requesting medication for vomiting. States he has vertigo currently along with severe nausea and vomiting. Patient is requesting medication-isn't wanting to go anywhere at this time.   Copied from CRM [id number]. Topic: Clinical - Red Word Triage >> Feb 27, 2024  1:12 PM Cherylann RAMAN wrote: [facility] that prompted transfer to Nurse Triage: Patient called in with complaints of bing dizzy with severe nausea. Reason for Disposition  Vomiting occurs with dizziness  Answer Assessment - Initial Assessment Questions 1. DESCRIPTION: Describe your dizziness.     I have vertigo 2. VERTIGO: Do you feel like either you or the room is spinning or tilting?      yes 3. LIGHTHEADED: Do you feel lightheaded? (e.g., somewhat faint, woozy, weak upon standing)     At times 4. SEVERITY: How bad is it?  Can you walk?   - MILD: Feels slightly dizzy and unsteady, but is walking normally.   - MODERATE: Feels unsteady when walking, but not falling; interferes with normal activities (e.g., school, work).   - SEVERE: Unable to walk without falling, or requires assistance to walk without falling.     Moderate to severe 5. ONSET:  When did the dizziness begin?     Started this morning 6. AGGRAVATING FACTORS: Does anything make it worse? (e.g., standing, change in head position)     Laying down 7. CAUSE: What do you think is causing the dizziness?     vertigo 8. RECURRENT SYMPTOM: Have you had dizziness before? If Yes, ask: When was the last time? What happened that time?     Yes-few weeks ago.  9. OTHER SYMPTOMS: Do you have any other symptoms? (e.g., headache, weakness, numbness, vomiting, earache)     Nausea and vomiting  Protocols used: Dizziness - Vertigo-A-AH

## 2024-02-27 NOTE — ED Provider Notes (Addendum)
 [place] EMERGENCY DEPARTMENT AT [facility] Provider Note  CSN: [id number] Arrival date & time: 02/27/24 1440  Chief Complaint(s) Dizziness  HPI Juan [name] is a 58 y.o. male who is here today for sudden onset dizziness.  Patient reports that when he woke up today, he was drinking juice, suddenly became very dizzy, had difficulty with walking, felt as though he was listing to 1 side.  Patient tried to lay down to help symptoms improve but they did not.  He has a past medical history significant for alcoholic cirrhosis, was recently treated in [place]  for sepsis, has been taking cefdinir .  Takes Lasix  for fluid overload, has been compliant with this medication.   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  Chronic GERD with HH  09/2019--EGD--Wf, [place]--showed no varices; gr D esophagitis;  Rx: PPI daily     Eustachian tube dysfunction 03/02/2023   Febrile illness 06/27/2023   Fracture of laryngeal cartilage (HCC) 01/17/2020   Last Assessment & Plan:   Formatting of this note might be different from the original.  Emergency department follow-up for evaluation of laryngeal trauma.  CT obtained the night of the injury is reviewed independently and shows nondisplaced fracture of the thyroid  cartilage.  In general, his voice is much improved from the night of the trauma.  Denies any difficulty breathing.  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 07/11/2020   Lower extremity edema 09/06/2022   Lumbar degenerative disc disease 07/26/2022   Malnutrition of moderate degree 01/11/2022   MDD (major depressive disorder), recurrent severe, 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   Nonrheumatic aortic valve stenosis    Normocytic anemia 03/02/2022   Pain of left  calf 04/11/2023   Pancytopenia (HCC) 09/18/2021   Portal hypertensive gastropathy (HCC)    Post-traumatic osteoarthritis of right knee 01/04/2023   Prolonged QT interval 05/05/2022   PTSD (post-traumatic stress disorder)    Rib pain on right side 10/31/2022   Right ankle sprain 02/12/2021   Right lower quadrant pain 04/17/2021   Right wrist  injury 10/18/2023   Shoulder pain, left, posterior 12/22/2021   Systolic murmur 01/06/2021   Thrombocytopenia (HCC) 11/02/2020   Tibialis posterior tendinitis, right 04/14/2021   Tinea pedis 05/12/2021   Tobacco chew use 03/02/2022   Traumatic hemorrhagic shock (HCC)    Urinary frequency 03/23/2021   Well adult exam 01/06/2021   Patient Active Problem List   Diagnosis Date Noted   Bacteremia 02/26/2024   Sepsis (HCC) 02/15/2024   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) 01/23/2024   Right wrist pain 01/23/2024   Alcoholic polyneuropathy (HCC) 93/97/7974   Moderate aortic stenosis 11/17/2023   Obesity (BMI 30.0-34.9) 11/17/2023   Alcohol addiction (HCC)    Cirrhosis (HCC)    Colon polyps    Depression    Hemochromatosis    Hx of blood clots    Hyperreflexia    Hypertension    Traumatic hemorrhagic shock (HCC)    Right wrist injury 10/18/2023   AKI (acute kidney injury) (HCC) 09/11/2023   Febrile illness 06/27/2023   Bipolar 2 disorder, major depressive episode (HCC) 05/23/2023   Alcohol use disorder 05/23/2023   Arthritis 05/23/2023   Pain of left calf 04/11/2023   Chest trauma 03/18/2023   Acute pain of right knee 03/10/2023   Eustachian tube dysfunction 03/02/2023   Breast nodule 01/12/2023   Post-traumatic osteoarthritis of right knee 01/04/2023   Acute bacterial sinusitis 11/15/2022   Rib pain on right side 10/31/2022   Head injury 10/31/2022   Bilateral lower extremity edema 10/12/2022   Epistaxis 09/14/2022   Anasarca 09/14/2022   Hematemesis 09/13/2022   Lower extremity edema 09/06/2022   Hyperbilirubinemia 08/17/2022   Anxiety 08/17/2022   GI bleed 08/16/2022   Lumbar degenerative disc disease 07/26/2022   Prolonged QT interval 05/05/2022   Anemia of chronic disease 05/05/2022   Hyponatremia 03/02/2022   GERD (gastroesophageal reflux disease) 03/02/2022   Normocytic anemia 03/02/2022   Tobacco chew use 03/02/2022   Hiatal hernia  02/20/2022   Alcoholic cirrhosis of liver with ascites (HCC) 02/17/2022   DDD (degenerative disc disease), cervical 01/22/2022   Hypokalemia 01/11/2022   Hypomagnesemia 01/11/2022   Malnutrition of moderate degree 01/11/2022   Nonrheumatic aortic valve stenosis    Shoulder pain, left, posterior 12/22/2021   Portal hypertensive gastropathy (HCC)    Pancytopenia (HCC) 09/18/2021   Tinea pedis 05/12/2021   Diverticulosis 04/20/2021   Right lower quadrant pain 04/17/2021   Tibialis posterior tendinitis, right 04/14/2021   Dizziness 03/23/2021   Urinary frequency 03/23/2021   Right ankle sprain 02/12/2021   Well adult exam 01/06/2021   Systolic murmur 01/06/2021   Thrombocytopenia (HCC) 11/02/2020   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 07/11/2020   Allergic reaction 07/08/2020   History of alcohol abuse 05/20/2020   Impingement syndrome, shoulder, right 05/20/2020   Fracture of laryngeal cartilage (HCC) 01/17/2020   Esophagitis, [place] grade D 12/05/2019   PTSD (post-traumatic stress disorder) 04/16/2019   MDD (major depressive disorder), recurrent severe, 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 Authorizing Provider  cefdinir  (OMNICEF ) 300 MG capsule Take 1 capsule (300 mg total) by mouth 2 (two) times daily for 14 days. 02/15/24 02/29/24  Alvia Bring, DO  diazepam  (VALIUM ) 10 MG tablet Take 1 tablet (10 mg total) by mouth as directed. Take 1 tablet 2 hours before dental appointment. Bring remainder of prescription with you to appointment. 11/30/23     diclofenac  Sodium (VOLTAREN ) 1 % GEL Apply 4 grams topically 4 (four) times daily  to affected joint. Patient not taking: Reported on 02/23/2024 02/08/23   Curtis Debby PARAS, MD  furosemide  (LASIX ) 40 MG tablet Take 40 mg by mouth daily.    [provider]  lactulose , encephalopathy, (CHRONULAC ) 10 GM/15ML SOLN Take 10 g by mouth daily as needed (constipation).    [provider]  magnesium  oxide (MAG-OX) 400 MG tablet Take 1 tablet (400 mg total) by mouth 2 (two) times daily. Patient not taking: Reported on 02/23/2024 05/13/23   Alvia Bring, DO  ondansetron  (ZOFRAN -ODT) 4 MG disintegrating tablet Take 1 tablet (4 mg total) by mouth every 8 (eight) hours as needed. 02/20/24   Long, Joshua G, MD  potassium chloride  SA (KLOR-CON  M) 20 MEQ tablet Take 2 tablets (40 mEq total) by mouth daily for 5 days. 02/20/24 03/03/24  Long, Fonda MATSU, MD  QUEtiapine  (SEROQUEL ) 50 MG tablet Take 0.5-1 tablets (25-50 mg total) by mouth at bedtime. 01/18/24   Alvia Bring, DO  spironolactone  (ALDACTONE ) 100 MG tablet Take 1 tablet (100 mg total) by mouth daily. 02/15/24   Alvia Bring, DO  traZODone  (DESYREL ) 50 MG tablet Take 0.5 tablets by mouth at bedtime. Patient not taking: Reported on 02/23/2024 10/12/23   [provider]  vortioxetine  HBr (TRINTELLIX ) 10 MG TABS tablet Take 1 tablet (10 mg total) by mouth daily. Patient not taking: Reported on 02/23/2024 09/27/23   Alvia Bring, DO                                                                                                                                    Past Surgical History Past Surgical History:  Procedure Laterality Date   BIOPSY  09/24/2021   Procedure: BIOPSY;  Surgeon: Federico Rosario BROCKS, MD;  Location: [facility] ENDOSCOPY;  Service: Gastroenterology;;   ORIN MEDIATE RELEASE Bilateral    COLONOSCOPY WITH PROPOFOL  N/A 09/24/2021   Procedure: COLONOSCOPY WITH PROPOFOL ;  Surgeon: Federico Rosario BROCKS, MD;  Location: [facility] ENDOSCOPY;  Service: Gastroenterology;  Laterality: N/A;   ESOPHAGOGASTRODUODENOSCOPY (EGD) WITH PROPOFOL   N/A 09/24/2021   Procedure: ESOPHAGOGASTRODUODENOSCOPY (EGD) WITH PROPOFOL ;  Surgeon: Federico Rosario BROCKS, MD;  Location: [facility] ENDOSCOPY;  Service: Gastroenterology;  Laterality: N/A;   ESOPHAGOGASTRODUODENOSCOPY (EGD) WITH PROPOFOL  N/A 08/18/2022   Procedure: ESOPHAGOGASTRODUODENOSCOPY (EGD) WITH PROPOFOL ;  Surgeon:  Abran Norleen SAILOR, MD;  Location: [facility] ENDOSCOPY;  Service: Gastroenterology;  Laterality: N/A;   FLEXIBLE SIGMOIDOSCOPY N/A 08/18/2022   Procedure: FLEXIBLE SIGMOIDOSCOPY;  Surgeon: Abran Norleen SAILOR, MD;  Location: [facility] ENDOSCOPY;  Service: Gastroenterology;  Laterality: N/A;   FRACTURE SURGERY     left ankle plate   HERNIA REPAIR     inguinal   KNEE SURGERY Right    x 4   SHOULDER SURGERY Bilateral    x 2   VASECTOMY     Family History Family History  Problem Relation Age of Onset   Pulmonary fibrosis Mother    Hypertension Father    Other Father        liver failure   Diabetes Brother    Breast cancer Paternal Aunt    Colon cancer Neg Hx    Esophageal cancer Neg Hx    Stomach cancer Neg Hx    Rectal cancer Neg Hx     Social History Social History   Tobacco Use   Smoking status: Never   Smokeless tobacco: Current    Types: Snuff  Vaping Use   Vaping status: Never Used  Substance Use Topics   Alcohol use: Not Currently   Drug use: Never   Allergies Apple juice, Cucumber extract, Depakote er [divalproex sodium er], Depakote [valproic acid], Peanut butter flavoring agent (non-screening), Peanut oil, Peanut-containing drug products, Shellfish allergy, Shrimp extract, Apple, Cantaloupe extract allergy skin test, Codeine, Firvanq  [vancomycin ], and Lactose intolerance (gi)  Review of Systems Review of Systems  Physical Exam Vital Signs  I have reviewed the triage vital signs BP 122/76   Pulse 61   Temp 98.1 F (36.7 C)   Resp 13   Ht 5' 5 (1.651 m)   Wt 80.7 kg   SpO2 100%   BMI 29.62 kg/m   Physical Exam Vitals and nursing note reviewed.  HENT:      Mouth/Throat:     Mouth: Mucous membranes are moist.  Cardiovascular:     Rate and Rhythm: Normal rate.  Pulmonary:     Effort: Pulmonary effort is normal.  Abdominal:     General: Abdomen is flat.     Palpations: Abdomen is soft.  Neurological:     Mental Status: He is alert.     Comments: Patient with unsteady gait, unable to ambulate, listing to the left.  Patient has bilateral nystagmus.  No upper or lower extremity weakness.  Normal sensation.     ED Results and Treatments Labs (all labs ordered are listed, but only abnormal results are displayed) Labs Reviewed  COMPREHENSIVE METABOLIC PANEL WITH GFR - Abnormal; Notable for the following components:      Result Value   Sodium 130 (*)    Potassium 3.2 (*)    CO2 12 (*)    Glucose, Bld 120 (*)    Total Protein 6.3 (*)    Albumin 3.1 (*)    AST 59 (*)    Total Bilirubin 5.6 (*)    Anion gap 19 (*)    All other components within normal limits  CBC - Abnormal; Notable for the following components:   WBC 13.4 (*)    RBC 3.20 (*)    Hemoglobin 8.6 (*)    HCT 26.6 (*)    RDW 23.4 (*)    Platelets 70 (*)    All other components within normal limits  LIPASE, BLOOD  URINALYSIS, ROUTINE W REFLEX MICROSCOPIC  Radiology No results found.  Pertinent labs & imaging results that were available during my care of the patient were reviewed by me and considered in my medical decision making (see MDM for details).  Medications Ordered in ED Medications  ondansetron  (ZOFRAN ) injection 4 mg (4 mg Intravenous Given 02/27/24 1903)  LORazepam  (ATIVAN ) injection 1 mg (1 mg Intravenous Given 02/27/24 1903)  potassium chloride  10 mEq in 100 mL IVPB (10 mEq Intravenous New Bag/Given 02/27/24 2027)                                                                                                                                      Procedures Procedures  (including critical care time)  Medical Decision Making / ED Course   This patient presents to the ED for concern of dizziness, this involves an extensive number of treatment options, and is a complaint that carries with it a high risk of complications and morbidity.  The differential diagnosis includes posterior CVA, peripheral vertigo.  MDM: M not able to exclude patient's dizziness as peripheral vertigo using hints exam due to presence of bilateral nystagmus.  With the patient's medical comorbidities, believe he requires MRI which I do not have available at my facility.  Will transfer him to [facility] for MRI.  Patient has metal in his knee, but has received MRI as recently as this past year.  Patient without infectious symptoms today.  Soft abdomen.  Read the patient's blood work, mild hyponatremia, mild hypokalemia.  Potassium repleted.   Additional history obtained: -Additional history obtained from family at bedside -External records from outside source obtained and reviewed including: Chart review including previous notes, labs, imaging, consultation notes   Lab Tests: -I ordered, reviewed, and interpreted labs.   The pertinent results include:   Labs Reviewed  COMPREHENSIVE METABOLIC PANEL WITH GFR - Abnormal; Notable for the following components:      Result Value   Sodium 130 (*)    Potassium 3.2 (*)    CO2 12 (*)    Glucose, Bld 120 (*)    Total Protein 6.3 (*)    Albumin 3.1 (*)    AST 59 (*)    Total Bilirubin 5.6 (*)    Anion gap 19 (*)    All other components within normal limits  CBC - Abnormal; Notable for the following components:   WBC 13.4 (*)    RBC 3.20 (*)    Hemoglobin 8.6 (*)    HCT 26.6 (*)    RDW 23.4 (*)    Platelets 70 (*)    All other components within normal limits  LIPASE, BLOOD  URINALYSIS, ROUTINE W REFLEX MICROSCOPIC     Medicines ordered and prescription drug management: Meds ordered this encounter   Medications   ondansetron  (ZOFRAN ) injection 4 mg   LORazepam  (ATIVAN ) injection 1 mg   potassium chloride  10 mEq in 100 mL IVPB    -  I have reviewed the patients home medicines and have made adjustments as needed   Cardiac Monitoring: The patient was maintained on a cardiac monitor.  I personally viewed and interpreted the cardiac monitored which showed an underlying rhythm of: Normal sinus rhythm  Social Determinants of Health:  Factors impacting patients care include: Lack of access to primary care   Reevaluation: After the interventions noted above, I reevaluated the patient and found that they have :improved  Co morbidities that complicate the patient evaluation  Past Medical History:  Diagnosis Date   Acute bacterial sinusitis 11/15/2022   Acute pain of right knee 03/10/2023   AKI (acute kidney injury) (HCC) 09/11/2023   Alcohol abuse 01/11/2022   Alcohol addiction (HCC)    Alcohol use disorder 05/23/2023   Alcoholic cirrhosis of liver with ascites (HCC) 02/17/2022   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  Chronic GERD with  HH  09/2019--EGD--Wf, [place]--showed no varices; gr D esophagitis;  Rx: PPI daily     Eustachian tube dysfunction 03/02/2023   Febrile illness 06/27/2023   Fracture of laryngeal cartilage (HCC) 01/17/2020   Last Assessment & Plan:   Formatting of this note might be different from the original.  Emergency department follow-up for evaluation of laryngeal trauma.  CT obtained the night of the injury is reviewed independently and shows nondisplaced fracture of the thyroid  cartilage.  In general, his voice is much improved from the night of the trauma.  Denies any difficulty breathing.  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 07/11/2020   Lower extremity edema 09/06/2022   Lumbar degenerative disc disease 07/26/2022   Malnutrition of moderate degree 01/11/2022   MDD (major depressive disorder), recurrent severe, 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   Nonrheumatic aortic valve stenosis    Normocytic anemia 03/02/2022   Pain of left calf 04/11/2023   Pancytopenia (HCC) 09/18/2021   Portal hypertensive gastropathy (HCC)    Post-traumatic osteoarthritis of right knee 01/04/2023   Prolonged QT interval 05/05/2022   PTSD (post-traumatic stress disorder)  Rib pain on right side 10/31/2022   Right ankle sprain 02/12/2021   Right lower quadrant pain 04/17/2021   Right wrist injury 10/18/2023   Shoulder  pain, left, posterior 12/22/2021   Systolic murmur 01/06/2021   Thrombocytopenia (HCC) 11/02/2020   Tibialis posterior tendinitis, right 04/14/2021   Tinea pedis 05/12/2021   Tobacco chew use 03/02/2022   Traumatic hemorrhagic shock (HCC)    Urinary frequency 03/23/2021   Well adult exam 01/06/2021      Dispostion: Transfer via private vehicle to [facility].     Final Clinical Impression(s) / ED Diagnoses Final diagnoses:  Dizziness     @PCDICTATION @    Mannie Pac T, DO 02/27/24 2136    Mannie Pac T, DO 02/27/24 2136

## 2024-02-27 NOTE — ED Notes (Signed)
 Unsuccessful attempt to provide UA. Aware a specimen is needed.

## 2024-02-28 LAB — BASIC METABOLIC PANEL WITH GFR
Anion gap: 9 (ref 5–15)
BUN: 16 mg/dL (ref 6–20)
CO2: 19 mmol/L — ABNORMAL LOW (ref 22–32)
Calcium: 9.4 mg/dL (ref 8.9–10.3)
Chloride: 104 mmol/L (ref 98–111)
Creatinine, Ser: 1.09 mg/dL (ref 0.61–1.24)
GFR, Estimated: >60 mL/min (ref >60)
Glucose, Bld: 115 mg/dL — ABNORMAL HIGH (ref 70–99)
Potassium: 3.5 mmol/L (ref 3.5–5.1)
Sodium: 132 mmol/L — ABNORMAL LOW (ref 135–145)

## 2024-02-28 LAB — ETHANOL: Alcohol, Ethyl (B): <15 mg/dL (ref <15)

## 2024-02-28 LAB — AMMONIA: Ammonia: 30 umol/L (ref 9–35)

## 2024-02-28 MED ORDER — ONDANSETRON 4 MG PO TBDP
4.0000 mg | ORAL_TABLET | Freq: Once | ORAL | Status: AC
  Administered 2024-02-28: 4 mg via ORAL
  Filled 2024-02-28: qty 1

## 2024-02-28 NOTE — ED Provider Notes (Signed)
 I assumed care of this patient from previous provider.  Please see their note for further details of history, exam, and MDM.   Briefly patient is a 58 y.o. male who presented as a transfer [facility] to rule out central process for vertiginous symptoms.  MRI is negative.  On reevaluation, patient reports that his dizziness has improved.  His nystagmus has resolved.  On review of the rest of the workup, his CMP shows slightly worsening bilirubin.  Renal function is intact.  His anion gap was elevated.  I ordered ammonia repeat BMP and ethanol level which were all reassuring.  Patient was able to ambulate with minimal assistance and support.    The patient appears reasonably screened and/or stabilized for discharge and I doubt any other medical condition or other [facility] requiring further screening, evaluation, or treatment in the ED at this time. I have discussed the findings, Dx and Tx plan with the patient/family who expressed understanding and agree(s) with the plan. Discharge instructions discussed at length. The patient/family was given strict return precautions who verbalized understanding of the instructions. No further questions at time of discharge.  Disposition: Discharge  Condition: Good  ED Discharge Orders     None         Ossiel Marchio, Raynell Moder, MD 02/28/24 [phone]

## 2024-02-28 NOTE — ED Notes (Signed)
Pt verbalized understanding of discharge instructions. Pt wheeled from ed family to drive home

## 2024-02-29 ENCOUNTER — Encounter: Payer: Self-pay | Admitting: Family Medicine

## 2024-02-29 ENCOUNTER — Ambulatory Visit (INDEPENDENT_AMBULATORY_CARE_PROVIDER_SITE_OTHER): Admitting: Family Medicine

## 2024-02-29 VITALS — BP 111/68 | HR 94 | Temp 99.9°F | Resp 20 | Ht 65.0 in

## 2024-02-29 DIAGNOSIS — K7031 Alcoholic cirrhosis of liver with ascites: Secondary | ICD-10-CM | POA: Diagnosis not present

## 2024-02-29 DIAGNOSIS — R7881 Bacteremia: Secondary | ICD-10-CM

## 2024-02-29 DIAGNOSIS — A689 Relapsing fever, unspecified: Secondary | ICD-10-CM | POA: Diagnosis not present

## 2024-02-29 NOTE — Patient Instructions (Signed)
 Reduce furosemide  to 1/2 tab.  Continue antibiotic.  Continue to gently hydrate Keep appt with ID tomorrow.

## 2024-03-01 ENCOUNTER — Other Ambulatory Visit: Payer: Self-pay

## 2024-03-01 ENCOUNTER — Telehealth: Payer: Self-pay

## 2024-03-01 ENCOUNTER — Other Ambulatory Visit (HOSPITAL_BASED_OUTPATIENT_CLINIC_OR_DEPARTMENT_OTHER): Payer: Self-pay

## 2024-03-01 ENCOUNTER — Ambulatory Visit (INDEPENDENT_AMBULATORY_CARE_PROVIDER_SITE_OTHER): Admitting: Internal Medicine

## 2024-03-01 ENCOUNTER — Encounter: Payer: Self-pay | Admitting: Internal Medicine

## 2024-03-01 VITALS — BP 100/61 | HR 77 | Temp 98.3°F | Ht 65.0 in

## 2024-03-01 DIAGNOSIS — R7881 Bacteremia: Secondary | ICD-10-CM | POA: Diagnosis not present

## 2024-03-01 MED ORDER — LINEZOLID 600 MG PO TABS
600.0000 mg | ORAL_TABLET | Freq: Two times a day (BID) | ORAL | 0 refills | Status: DC
  Filled 2024-03-01: qty 28, 14d supply, fill #0

## 2024-03-01 MED ORDER — VANCOMYCIN HCL 125 MG PO CAPS
125.0000 mg | ORAL_CAPSULE | Freq: Two times a day (BID) | ORAL | 1 refills | Status: AC
Start: 2024-03-01 — End: 2024-05-02
  Filled 2024-03-01: qty 56, 28d supply, fill #0
  Filled 2024-03-27: qty 56, 28d supply, fill #1

## 2024-03-01 NOTE — Telephone Encounter (Signed)
 New OPAT orders per Dr. Overton. Notified Holley Herring, RN with [employer].   PICC appointment 7/15 @ 11 AM [place]. Patient and wife agreeable with appointment.   No first dose needed per Dr. Overton, patient has tolerated beta lactams in the past.   Chastin Riesgo, BSN, RN

## 2024-03-01 NOTE — Progress Notes (Signed)
 [facility]  Reason for Consult:bacteremia Referring Provider: Alvia Bring    Patient Active Problem List   Diagnosis Date Noted   Bacteremia 02/26/2024   Sepsis (HCC) 02/15/2024   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) 01/23/2024   Right wrist pain 01/23/2024   Alcoholic polyneuropathy (HCC) 93/97/7974   Moderate aortic stenosis 11/17/2023   Obesity (BMI 30.0-34.9) 11/17/2023   Alcohol addiction (HCC)    Cirrhosis (HCC)    Colon polyps    Depression    Hemochromatosis    Hx of blood clots    Hyperreflexia    Hypertension    Traumatic hemorrhagic shock (HCC)    Right wrist injury 10/18/2023   AKI (acute kidney injury) (HCC) 09/11/2023   Febrile illness 06/27/2023   Bipolar 2 disorder, major depressive episode (HCC) 05/23/2023   Alcohol use disorder 05/23/2023   Arthritis 05/23/2023   Pain of left calf 04/11/2023   Chest trauma 03/18/2023   Acute pain of right knee 03/10/2023   Eustachian tube dysfunction 03/02/2023   Breast nodule 01/12/2023   Post-traumatic osteoarthritis of right knee 01/04/2023   Acute bacterial sinusitis 11/15/2022   Rib pain on right side 10/31/2022   Head injury 10/31/2022   Bilateral lower extremity edema 10/12/2022   Epistaxis 09/14/2022   Anasarca 09/14/2022   Hematemesis 09/13/2022   Lower extremity edema 09/06/2022   Hyperbilirubinemia 08/17/2022   Anxiety 08/17/2022   GI bleed 08/16/2022   Lumbar degenerative disc disease 07/26/2022   Prolonged QT interval 05/05/2022   Anemia of chronic disease 05/05/2022   Hyponatremia 03/02/2022   GERD (gastroesophageal reflux disease) 03/02/2022   Normocytic anemia 03/02/2022   Tobacco chew use 03/02/2022   Hiatal hernia 02/20/2022   Alcoholic cirrhosis of liver with ascites (HCC) 02/17/2022   DDD (degenerative disc disease), cervical 01/22/2022   Hypokalemia 01/11/2022   Hypomagnesemia 01/11/2022   Malnutrition of moderate  degree 01/11/2022   Nonrheumatic aortic valve stenosis    Shoulder pain, left, posterior 12/22/2021   Portal hypertensive gastropathy (HCC)    Pancytopenia (HCC) 09/18/2021   Tinea pedis 05/12/2021   Diverticulosis 04/20/2021   Right lower quadrant pain 04/17/2021   Tibialis posterior tendinitis, right 04/14/2021   Dizziness 03/23/2021   Urinary frequency 03/23/2021   Right ankle sprain 02/12/2021   Well adult exam 01/06/2021   Systolic murmur 01/06/2021   Thrombocytopenia (HCC) 11/02/2020   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 07/11/2020   Allergic reaction 07/08/2020   History of alcohol abuse 05/20/2020   Impingement syndrome, shoulder, right 05/20/2020   Fracture of laryngeal cartilage (HCC) 01/17/2020   Esophagitis, [place] grade D 12/05/2019   PTSD (post-traumatic stress disorder) 04/16/2019   MDD (major depressive disorder), recurrent severe, 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   History of bilateral inguinal hernia repair 01/19/2019   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 male etoh cirrhosis here for bacteremia referral from his pcp   Reviewed chart/discussed with patient Patient drove road trip to [place]  mid 01/2024. Acutely felt ill 1-2 days and developed fever admitted [facility] and had blood cx 02/08/24. He didn't stay overnight; just ed visit  Given 1 dose iv abx and sent out on cefdinir   Saw pcp in f/u on 02/12/24 continued cefdinir   Subjective  fever again repeat bcx ordered by pcp on 6/30 negative  Echo ordered by pcp not yet done  Dizzy the past few days and had brain mri 7/7 no acute abnormality   No previous dental procedure/teeth issue, gib, abd pain, n/v/diarrhea, skin trauma within the month prior to the septic episode   He had  cdiff x3 in the past (2 years ago). Wife said he had red skin with PO vancomycin  previously?     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  Chronic GERD with HH  09/2019--EGD--W[facility], [place]--showed no varices; gr D esophagitis;  Rx: PPI daily     Eustachian tube dysfunction 03/02/2023   Febrile illness 06/27/2023   Fracture of laryngeal cartilage (HCC) 01/17/2020   Last Assessment & Plan:   Formatting of this note might be different from the original.  Emergency department follow-up for evaluation of laryngeal trauma.  CT obtained the night of the injury is reviewed independently and shows nondisplaced fracture of the thyroid  cartilage.  In general, his voice is much improved from  the night of the trauma.  Denies any difficulty breathing.  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 07/11/2020   Lower extremity edema 09/06/2022   Lumbar degenerative disc disease 07/26/2022   Malnutrition of moderate degree 01/11/2022   MDD (major depressive disorder), recurrent severe, 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   Nonrheumatic aortic valve stenosis    Normocytic anemia 03/02/2022   Pain of left calf 04/11/2023   Pancytopenia (HCC) 09/18/2021   Portal hypertensive gastropathy (HCC)    Post-traumatic osteoarthritis of right knee 01/04/2023   Prolonged QT interval 05/05/2022  PTSD (post-traumatic stress disorder)    Rib pain on right side 10/31/2022   Right ankle sprain 02/12/2021   Right lower quadrant pain 04/17/2021   Right wrist injury 10/18/2023   Shoulder pain, left, posterior 12/22/2021   Systolic murmur 01/06/2021   Thrombocytopenia (HCC) 11/02/2020   Tibialis posterior tendinitis, right 04/14/2021   Tinea pedis 05/12/2021   Tobacco chew use 03/02/2022   Traumatic hemorrhagic shock (HCC)    Urinary frequency 03/23/2021   Well adult exam 01/06/2021    Social History   Tobacco Use   Smoking status: Never   Smokeless tobacco: Current    Types: Snuff  Vaping Use   Vaping status: Never Used  Substance Use Topics   Alcohol use: Not Currently   Drug use: Never     Family History  Problem Relation Age of Onset   Pulmonary fibrosis Mother    Hypertension Father    Other Father        liver failure   Diabetes Brother    Breast cancer Paternal Aunt    Colon cancer Neg Hx    Esophageal cancer Neg Hx    Stomach cancer Neg Hx    Rectal cancer Neg Hx     Allergies  Allergen Reactions   Apple Juice Swelling and Other (See Comments)    Tongue swelling   Cucumber Extract Itching and Nausea And Vomiting    No extracts; just cucumber   Depakote Er [Divalproex Sodium Er] Swelling and Other (See Comments)    Tongue swelling   Depakote [Valproic Acid] Anaphylaxis   Peanut Butter Flavoring Agent (Non-Screening) Anaphylaxis and Swelling   Peanut Oil Swelling   Peanut-Containing Drug Products Swelling   Shellfish Allergy Itching and Swelling   Shrimp Extract Itching and Swelling   Apple Swelling   Cantaloupe Extract Allergy Skin Test Rash   Codeine Itching, Rash and Other (See Comments)    Patient reports he can take CODONES without problems   Firvanq  [Vancomycin ] Rash   Lactose Intolerance (Gi) Diarrhea and Other (See Comments)    Indigestion, Stomach pain, Flatulence    OBJECTIVE: Vitals:   03/01/24 1300  BP: 100/61  Pulse: 77  Temp: 98.3 F (36.8 C)  TempSrc: Temporal  SpO2: 100%  Height: 5' 5 (1.651 m)   Body mass index is 29.62 kg/m.   Physical Exam General/constitutional: no distress, pleasant; in wheel chair HEENT: Normocephalic, PER, Conj Clear, EOMI, Oropharynx clear Neck supple CV: rrr no mrg Lungs: clear to auscultation, normal respiratory effort Abd: Soft, Nontender Ext: no edema Skin: No Rash Neuro: nonfocal MSK: no peripheral joint swelling/tenderness/warmth; back spines nontender     Lab: Lab Results  Component Value Date   WBC 10.1 02/29/2024   HGB 8.0 (L) 02/29/2024   HCT 24.7 (L) 02/29/2024   MCV 82 02/29/2024   PLT 66 (LL) 02/29/2024   Last metabolic panel Lab Results  Component Value Date    GLUCOSE 131 (H) 02/29/2024   NA 132 (L) 02/29/2024   K 3.7 02/29/2024   CL 101 02/29/2024   CO2 17 (L) 02/29/2024   BUN 16 02/29/2024   CREATININE 1.42 (H) 02/29/2024   EGFR 58 (L) 02/29/2024   CALCIUM  8.7 02/29/2024   PHOS 3.2 01/20/2023   PROT 5.5 (L) 02/29/2024   ALBUMIN 2.7 (L) 02/29/2024   LABGLOB 2.8 02/29/2024   AGRATIO 0.6 (L) 10/29/2022   BILITOT 4.4 (H) 02/29/2024   ALKPHOS 112 02/29/2024   AST 72 (H) 02/29/2024   ALT 29  02/29/2024   ANIONGAP 9 02/28/2024    Microbiology:  Serology:  Imaging: Reviewed   7/7 mri brain wo contrast No acute abnormality  Assessment/plan: Problem List Items Addressed This Visit     Bacteremia - Primary   Relevant Orders   IR Fluoro Guide CV Line Left      Cirrhosis (etoh); no recent dental procedures; o gib; no hx sbp; no clear source strep parasanguinis bsi  High burden  Concerning for endocarditis. Onset early June, now still fever. Repeat bcx 02/20/24 negative (ordered by pcp). Echo also ordered by pcp  Given that he has been on a few weeks po abx cefdinir , suspect bcx will be unrevealing. 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 ?medication side effect   He has LLE orif no tenderness there. No other site of new tenderness   Hx cdiff a couple years ago. Has had 3 episodes    Called [facility] 02/08/24 bcx strep parasanguinis -- PCN susceptible (0.12), Ceftriaxone  (mic </= 0.12)  -picc -iv abx penicillin g 24 million units continuous daily -bid po vanc 125 mg capsule for duration of abx for secondary prophylaxis; and to go 1 week beyond systemic abx -f/u 3-4 weeks -chart forwarded to PCP     OPAT Orders Discharge antibiotics to be given via PICC line Discharge antibiotics: Pen g 24 million units cdi  Duration: 6 weeks from 03/01/24 End  Date: 04/12/24  [facility] Care Per Protocol:  Home health RN for IV administration and teaching; PICC line care and labs.    Labs weekly while on IV antibiotics: _x_ CBC with differential __ BMP _x_ CMP __ CRP __ ESR __ Vancomycin  trough __ CK  _x_ Please pull PIC at completion of IV antibiotics __ Please leave PIC in place until doctor has seen patient or been notified  Fax weekly labs to [phone]    Follow-up: Return in about 4 weeks (around 03/29/2024).  Constance ONEIDA Passer, MD [facility] 03/01/2024, 1:08 PM

## 2024-03-01 NOTE — Patient Instructions (Signed)
 We'll await your blood cx result repeat and echo   Will treat as endocarditis with 6 weeks iv antibiotics  Penicillin g continuous daily infusion    Pick up linezolid  today; take 1 tablet twice a day while waiting for picc/iv abx to start -- stop it the day iv antibiotics start    Given prior cdiff, I'll put you on vancomycin  pill take 1 capsule twice a day. Your body really do not absorb oral vancomycin  at any decent concentration to cause redman syndrome    See us  again in 3-4 weeks   Thank you

## 2024-03-01 NOTE — Telephone Encounter (Signed)
 Thank you -  listing end date as 04/17/24 given 6 weeks after PICC placement.

## 2024-03-02 ENCOUNTER — Ambulatory Visit: Payer: Self-pay | Admitting: Family Medicine

## 2024-03-02 LAB — CMP14+EGFR
ALT: 29 IU/L (ref 0–44)
AST: 72 IU/L — ABNORMAL HIGH (ref 0–40)
Albumin: 2.7 g/dL — ABNORMAL LOW (ref 3.8–4.9)
Alkaline Phosphatase: 112 IU/L (ref 44–121)
BUN/Creatinine Ratio: 11 (ref 9–20)
BUN: 16 mg/dL (ref 6–24)
Bilirubin Total: 4.4 mg/dL — ABNORMAL HIGH (ref 0.0–1.2)
CO2: 17 mmol/L — ABNORMAL LOW (ref 20–29)
Calcium: 8.7 mg/dL (ref 8.7–10.2)
Chloride: 101 mmol/L (ref 96–106)
Creatinine, Ser: 1.42 mg/dL — ABNORMAL HIGH (ref 0.76–1.27)
Globulin, Total: 2.8 g/dL (ref 1.5–4.5)
Glucose: 131 mg/dL — ABNORMAL HIGH (ref 70–99)
Potassium: 3.7 mmol/L (ref 3.5–5.2)
Sodium: 132 mmol/L — ABNORMAL LOW (ref 134–144)
Total Protein: 5.5 g/dL — ABNORMAL LOW (ref 6.0–8.5)
eGFR: 58 mL/min/1.73 — ABNORMAL LOW (ref >59)

## 2024-03-02 LAB — PROTIME-INR
INR: 2 — ABNORMAL HIGH (ref 0.9–1.2)
Prothrombin Time: 20.9 s — ABNORMAL HIGH (ref 9.1–12.0)

## 2024-03-02 LAB — CBC WITH DIFFERENTIAL/PLATELET
Basophils Absolute: 0 x10E3/uL (ref 0.0–0.2)
Basos: 0 %
EOS (ABSOLUTE): 0.1 x10E3/uL (ref 0.0–0.4)
Eos: 1 %
Hematocrit: 24.7 % — ABNORMAL LOW (ref 37.5–51.0)
Hemoglobin: 8 g/dL — ABNORMAL LOW (ref 13.0–17.7)
Immature Grans (Abs): 0 x10E3/uL (ref 0.0–0.1)
Immature Granulocytes: 0 %
Lymphocytes Absolute: 1 x10E3/uL (ref 0.7–3.1)
Lymphs: 9 %
MCH: 26.5 pg — ABNORMAL LOW (ref 26.6–33.0)
MCHC: 32.4 g/dL (ref 31.5–35.7)
MCV: 82 fL (ref 79–97)
Monocytes Absolute: 1.1 x10E3/uL — ABNORMAL HIGH (ref 0.1–0.9)
Monocytes: 11 %
Neutrophils Absolute: 7.9 x10E3/uL — ABNORMAL HIGH (ref 1.4–7.0)
Neutrophils: 79 %
Platelets: 66 x10E3/uL — CL (ref 150–450)
RBC: 3.02 x10E6/uL — ABNORMAL LOW (ref 4.14–5.80)
RDW: 20.6 % — ABNORMAL HIGH (ref 11.6–15.4)
WBC: 10.1 x10E3/uL (ref 3.4–10.8)

## 2024-03-02 LAB — MAGNESIUM: Magnesium: 1.2 mg/dL — ABNORMAL LOW (ref 1.6–2.3)

## 2024-03-02 LAB — AMMONIA: Ammonia: 57 ug/dL (ref 40–200)

## 2024-03-02 NOTE — Assessment & Plan Note (Addendum)
 Mild dehydration due to overdiuresis.  Will back down on Lasix  to 20 mg daily.  Continue Aldactone .  Gently rehydrate with oral solution at home.  Encourage increased protein intake.  Discussed importance of avoiding alcohol intake.  He does have an appointment with gastroenterology scheduled.  Rechecking electrolytes, LFTs and synthetic function of liver.

## 2024-03-02 NOTE — Assessment & Plan Note (Signed)
 He has been on cefdinir  since discharge from hospital in [place] .  Cultures were sensitive to ceftriaxone  at that time.  He has not really been compliant with this.  He does continue to have some low-grade fever.  Denies abdominal pain, suggest SBP.  He has an appointment with infectious disease tomorrow.  He does have murmur at baseline but concerned about possible vegetation and I have ordered an echocardiogram.

## 2024-03-02 NOTE — Progress Notes (Signed)
 Juan [name] - 58 y.o. male MRN [id number]  Date of birth: [birth year]  Subjective Chief Complaint  Patient presents with   Follow-up    ER 02/28/2024    HPI Juan [name] is a 58 year old male here today for follow-up.  He was again in the ED yesterday due to dizziness.  MRI negative.  He was noted to have some nystagmus on exam but resolved on reevaluation.  He was told that he may have mild dehydration as he has been on diuretics due to fluid overload.  He has been prescribed cefdinir  since discharge from hospital in [place]  where he had sepsis and bacteremia with strep species.  He has not been compliant with this.  He continues to have episodic fevers.  He denies any abdominal pain, nausea, vomiting, diarrhea or increased joint pain, urinary symptoms, dyspnea or headaches.  ROS:  A comprehensive ROS was completed and negative except as noted per HPI  Allergies  Allergen Reactions   Apple Juice Swelling and Other (See Comments)    Tongue swelling   Cucumber Extract Itching and Nausea And Vomiting    No extracts; just cucumber   Depakote Er [Divalproex Sodium Er] Swelling and Other (See Comments)    Tongue swelling   Depakote [Valproic Acid] Anaphylaxis   Peanut Butter Flavoring Agent (Non-Screening) Anaphylaxis and Swelling   Peanut Oil Swelling   Peanut-Containing Drug Products Swelling   Shellfish Allergy Itching and Swelling   Shrimp Extract Itching and Swelling   Apple Swelling   Cantaloupe Extract Allergy Skin Test Rash   Codeine Itching, Rash and Other (See Comments)    Patient reports he can take CODONES without problems   Firvanq  [Vancomycin ] Rash   Lactose Intolerance (Gi) Diarrhea and Other (See Comments)    Indigestion, Stomach pain, Flatulence    Past Medical History:  Diagnosis Date   Acute bacterial sinusitis 11/15/2022   Acute pain of right knee 03/10/2023   AKI (acute kidney injury) (HCC) 09/11/2023   Alcohol abuse 01/11/2022   Alcohol addiction  (HCC)    Alcohol use disorder 05/23/2023   Alcoholic cirrhosis of liver with ascites (HCC) 02/17/2022   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  Chronic GERD with HH  09/2019--EGD--[facility], [place]--showed no varices; gr D esophagitis;  Rx: PPI daily     Eustachian tube dysfunction 03/02/2023   Febrile illness 06/27/2023   Fracture of laryngeal cartilage (HCC) 01/17/2020   Last Assessment & Plan:   Formatting of this note might be different from the original.  Emergency department follow-up for evaluation of laryngeal trauma.  CT obtained the night of the injury is reviewed independently and shows  nondisplaced fracture of the thyroid  cartilage.  In general, his voice is much improved from the night of the trauma.  Denies any difficulty breathing.  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 07/11/2020   Lower extremity edema 09/06/2022   Lumbar degenerative disc disease 07/26/2022   Malnutrition of moderate degree 01/11/2022   MDD (major depressive disorder), recurrent severe, 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   Nonrheumatic aortic valve stenosis    Normocytic anemia 03/02/2022   Pain of left calf 04/11/2023   Pancytopenia (HCC) 09/18/2021   Portal hypertensive gastropathy (HCC)    Post-traumatic osteoarthritis of right knee 01/04/2023   Prolonged QT interval 05/05/2022   PTSD (post-traumatic stress disorder)    Rib pain on right side 10/31/2022   Right ankle sprain 02/12/2021   Right lower quadrant pain 04/17/2021   Right wrist injury 10/18/2023   Shoulder pain, left, posterior 12/22/2021   Systolic murmur 01/06/2021   Thrombocytopenia (HCC) 11/02/2020   Tibialis posterior tendinitis, right 04/14/2021   Tinea pedis 05/12/2021   Tobacco chew use 03/02/2022   Traumatic hemorrhagic shock (HCC)    Urinary frequency 03/23/2021   Well adult exam 01/06/2021    Past Surgical History:  Procedure Laterality Date   BIOPSY  09/24/2021   Procedure: BIOPSY;  Surgeon: Federico Rosario BROCKS, MD;  Location: [facility] ENDOSCOPY;  Service: Gastroenterology;;   ORIN MEDIATE RELEASE Bilateral    COLONOSCOPY WITH PROPOFOL  N/A 09/24/2021   Procedure: COLONOSCOPY WITH PROPOFOL ;  Surgeon: Federico Rosario BROCKS, MD;  Location: [facility] ENDOSCOPY;  Service: Gastroenterology;  Laterality: N/A;   ESOPHAGOGASTRODUODENOSCOPY (EGD) WITH PROPOFOL  N/A 09/24/2021   Procedure: ESOPHAGOGASTRODUODENOSCOPY (EGD) WITH PROPOFOL ;  Surgeon: Federico Rosario BROCKS, MD;  Location: [facility] ENDOSCOPY;  Service:  Gastroenterology;  Laterality: N/A;   ESOPHAGOGASTRODUODENOSCOPY (EGD) WITH PROPOFOL  N/A 08/18/2022   Procedure: ESOPHAGOGASTRODUODENOSCOPY (EGD) WITH PROPOFOL ;  Surgeon: Abran Norleen SAILOR, MD;  Location: [facility] ENDOSCOPY;  Service: Gastroenterology;  Laterality: N/A;   FLEXIBLE SIGMOIDOSCOPY N/A 08/18/2022   Procedure: FLEXIBLE SIGMOIDOSCOPY;  Surgeon: Abran Norleen SAILOR, MD;  Location: [facility] ENDOSCOPY;  Service: Gastroenterology;  Laterality: N/A;   FRACTURE SURGERY     left ankle plate   HERNIA REPAIR     inguinal   KNEE SURGERY Right    x 4   SHOULDER SURGERY Bilateral    x 2   VASECTOMY      Social History   Socioeconomic History   Marital status: Married    Spouse name: [name]   Number of children: 2   Years of education: Not on file   Highest education level: 12th grade  Occupational History    Comment: disability  Tobacco Use   Smoking status: Never   Smokeless tobacco: Current    Types: Snuff  Vaping Use   Vaping status: Never Used  Substance and Sexual Activity   Alcohol use: Not Currently   Drug use: Never  Sexual activity: Yes    Partners: Female  Other Topics Concern   Not on file  Social History Narrative   Lives with wife   Social Drivers of Health   Financial Resource Strain: Low Risk  (10/18/2023)   Overall Financial Resource Strain (CARDIA)    Difficulty of Paying Living Expenses: Not hard at all  Food Insecurity: No Food Insecurity (10/18/2023)   Hunger Vital Sign    Worried About Running Out of Food in the Last Year: Never true    Ran Out of Food in the Last Year: Never true  Transportation Needs: No Transportation Needs (10/18/2023)   PRAPARE - Administrator, Civil Service (Medical): No    Lack of Transportation (Non-Medical): No  Physical Activity: Insufficiently Active (10/18/2023)   Exercise Vital Sign    Days of Exercise per Week: 2 days    Minutes of Exercise per Session: 60 min  Stress: Stress Concern Present (01/09/2023)   [employer] of Occupational [employer] - Occupational Stress Questionnaire    Feeling of Stress : To some extent  Social Connections: Unknown (10/18/2023)   Social Connection and Isolation Panel    Frequency of Communication with Friends and Family: More than three times a week    Frequency of Social Gatherings with Friends and Family: Once a week    Attends Religious Services: More than 4 times per year    Active Member of [employer] or Organizations: Patient declined    Attends Banker Meetings: Patient declined    Marital Status: Married    Family History  Problem Relation Age of Onset   Pulmonary fibrosis Mother    Hypertension Father    Other Father        liver failure   Diabetes Brother    Breast cancer Paternal Aunt    Colon cancer Neg Hx    Esophageal cancer Neg Hx    Stomach cancer Neg Hx    Rectal cancer Neg Hx     Health Maintenance  Topic Date Due   Medicare Annual Wellness (AWV)  Never done   Hepatitis B Vaccines (2 of 3 - 19+ 3-dose series) 12/02/2022   COVID-19 Vaccine (4 - 2024-25 season) 04/17/2024 (Originally 04/24/2023)   INFLUENZA VACCINE  03/23/2024   DTaP/Tdap/Td (3 - Td or Tdap) 06/20/2029   Colonoscopy  09/25/2031   Pneumococcal Vaccine 66-40 Years old  Completed   Hepatitis C Screening  Completed   HIV Screening  Completed   Zoster Vaccines- Shingrix  Completed   HPV VACCINES  Aged Out   Meningococcal B Vaccine  Aged Out     ----------------------------------------------------------------------------------------------------------------------------------------------------------------------------------------------------------------- Physical Exam BP 111/68   Pulse 94   Temp 99.9 F (37.7 C) (Oral)   Resp 20   Ht 5' 5 (1.651 m)   SpO2 97%   BMI 29.62 kg/m   Physical Exam Constitutional:      Appearance: Normal appearance.  Eyes:     General: No scleral icterus. Cardiovascular:     Rate and Rhythm: Normal rate and regular rhythm.      Comments: 3 out of 6 systolic murmur. Pulmonary:     Effort: Pulmonary effort is normal.     Breath sounds: Normal breath sounds.  Abdominal:     General: There is no distension.     Palpations: Abdomen is soft.     Tenderness: There is no abdominal tenderness.  Musculoskeletal:     Cervical back: Neck supple.  Neurological:  General: No focal deficit present.     Mental Status: He is alert.  Psychiatric:        Mood and Affect: Mood normal.        Behavior: Behavior normal.     ------------------------------------------------------------------------------------------------------------------------------------------------------------------------------------------------------------------- Assessment and Plan  Bacteremia He has been on cefdinir  since discharge from hospital in [place] .  Cultures were sensitive to ceftriaxone  at that time.  He has not really been compliant with this.  He does continue to have some low-grade fever.  Denies abdominal pain, suggest SBP.  He has an appointment with infectious disease tomorrow.  He does have murmur at baseline but concerned about possible vegetation and I have ordered an echocardiogram.  Alcoholic cirrhosis of liver with ascites (HCC) Mild dehydration due to overdiuresis.  Will back down on Lasix  to 20 mg daily.  Continue Aldactone .  Gently rehydrate with oral solution at home.  Encourage increased protein intake.  Discussed importance of avoiding alcohol intake.  He does have an appointment with gastroenterology scheduled.  Rechecking electrolytes, LFTs and synthetic function of liver.   No orders of the defined types were placed in this encounter.   No follow-ups on file.

## 2024-03-03 ENCOUNTER — Encounter (HOSPITAL_COMMUNITY): Payer: Self-pay | Admitting: Emergency Medicine

## 2024-03-03 ENCOUNTER — Inpatient Hospital Stay (HOSPITAL_COMMUNITY)

## 2024-03-03 ENCOUNTER — Inpatient Hospital Stay (HOSPITAL_COMMUNITY)
Admission: EM | Admit: 2024-03-03 | Discharge: 2024-03-09 | DRG: 871 | Disposition: A | Discharge status: Home Health | Source: Ambulatory Visit | Attending: Family Medicine | Admitting: Family Medicine

## 2024-03-03 ENCOUNTER — Emergency Department (HOSPITAL_COMMUNITY)

## 2024-03-03 ENCOUNTER — Other Ambulatory Visit: Payer: Self-pay

## 2024-03-03 DIAGNOSIS — D508 Other iron deficiency anemias: Secondary | ICD-10-CM | POA: Diagnosis present

## 2024-03-03 DIAGNOSIS — D696 Thrombocytopenia, unspecified: Secondary | ICD-10-CM | POA: Diagnosis present

## 2024-03-03 DIAGNOSIS — K209 Esophagitis, unspecified without bleeding: Secondary | ICD-10-CM | POA: Diagnosis not present

## 2024-03-03 DIAGNOSIS — Z803 Family history of malignant neoplasm of breast: Secondary | ICD-10-CM

## 2024-03-03 DIAGNOSIS — K7031 Alcoholic cirrhosis of liver with ascites: Secondary | ICD-10-CM | POA: Diagnosis present

## 2024-03-03 DIAGNOSIS — Z881 Allergy status to other antibiotic agents status: Secondary | ICD-10-CM

## 2024-03-03 DIAGNOSIS — I081 Rheumatic disorders of both mitral and tricuspid valves: Secondary | ICD-10-CM | POA: Diagnosis not present

## 2024-03-03 DIAGNOSIS — Z91018 Allergy to other foods: Secondary | ICD-10-CM

## 2024-03-03 DIAGNOSIS — F17290 Nicotine dependence, other tobacco product, uncomplicated: Secondary | ICD-10-CM | POA: Diagnosis present

## 2024-03-03 DIAGNOSIS — D72829 Elevated white blood cell count, unspecified: Secondary | ICD-10-CM | POA: Diagnosis not present

## 2024-03-03 DIAGNOSIS — F3181 Bipolar II disorder: Secondary | ICD-10-CM | POA: Diagnosis present

## 2024-03-03 DIAGNOSIS — Z79899 Other long term (current) drug therapy: Secondary | ICD-10-CM

## 2024-03-03 DIAGNOSIS — K7030 Alcoholic cirrhosis of liver without ascites: Secondary | ICD-10-CM | POA: Diagnosis not present

## 2024-03-03 DIAGNOSIS — R7881 Bacteremia: Secondary | ICD-10-CM | POA: Diagnosis present

## 2024-03-03 DIAGNOSIS — E8770 Fluid overload, unspecified: Secondary | ICD-10-CM | POA: Diagnosis present

## 2024-03-03 DIAGNOSIS — J810 Acute pulmonary edema: Secondary | ICD-10-CM | POA: Diagnosis present

## 2024-03-03 DIAGNOSIS — D62 Acute posthemorrhagic anemia: Secondary | ICD-10-CM | POA: Diagnosis present

## 2024-03-03 DIAGNOSIS — F1010 Alcohol abuse, uncomplicated: Secondary | ICD-10-CM | POA: Diagnosis not present

## 2024-03-03 DIAGNOSIS — K8050 Calculus of bile duct without cholangitis or cholecystitis without obstruction: Secondary | ICD-10-CM | POA: Diagnosis present

## 2024-03-03 DIAGNOSIS — I10 Essential (primary) hypertension: Secondary | ICD-10-CM | POA: Diagnosis present

## 2024-03-03 DIAGNOSIS — Z8619 Personal history of other infectious and parasitic diseases: Secondary | ICD-10-CM | POA: Diagnosis not present

## 2024-03-03 DIAGNOSIS — I358 Other nonrheumatic aortic valve disorders: Secondary | ICD-10-CM | POA: Diagnosis not present

## 2024-03-03 DIAGNOSIS — F418 Other specified anxiety disorders: Secondary | ICD-10-CM | POA: Diagnosis not present

## 2024-03-03 DIAGNOSIS — F1020 Alcohol dependence, uncomplicated: Secondary | ICD-10-CM | POA: Diagnosis present

## 2024-03-03 DIAGNOSIS — E872 Acidosis, unspecified: Secondary | ICD-10-CM | POA: Diagnosis present

## 2024-03-03 DIAGNOSIS — K2289 Other specified disease of esophagus: Secondary | ICD-10-CM | POA: Diagnosis present

## 2024-03-03 DIAGNOSIS — R6520 Severe sepsis without septic shock: Secondary | ICD-10-CM | POA: Diagnosis present

## 2024-03-03 DIAGNOSIS — R000 Tachycardia, unspecified: Secondary | ICD-10-CM | POA: Diagnosis not present

## 2024-03-03 DIAGNOSIS — K766 Portal hypertension: Secondary | ICD-10-CM | POA: Diagnosis present

## 2024-03-03 DIAGNOSIS — K862 Cyst of pancreas: Secondary | ICD-10-CM | POA: Diagnosis present

## 2024-03-03 DIAGNOSIS — K31811 Angiodysplasia of stomach and duodenum with bleeding: Secondary | ICD-10-CM | POA: Diagnosis present

## 2024-03-03 DIAGNOSIS — R509 Fever, unspecified: Secondary | ICD-10-CM

## 2024-03-03 DIAGNOSIS — D638 Anemia in other chronic diseases classified elsewhere: Secondary | ICD-10-CM | POA: Diagnosis not present

## 2024-03-03 DIAGNOSIS — I2510 Atherosclerotic heart disease of native coronary artery without angina pectoris: Secondary | ICD-10-CM | POA: Diagnosis present

## 2024-03-03 DIAGNOSIS — K7460 Unspecified cirrhosis of liver: Secondary | ICD-10-CM | POA: Diagnosis not present

## 2024-03-03 DIAGNOSIS — Z1152 Encounter for screening for COVID-19: Secondary | ICD-10-CM | POA: Diagnosis not present

## 2024-03-03 DIAGNOSIS — Z860100 Personal history of colon polyps, unspecified: Secondary | ICD-10-CM

## 2024-03-03 DIAGNOSIS — Z885 Allergy status to narcotic agent status: Secondary | ICD-10-CM

## 2024-03-03 DIAGNOSIS — F319 Bipolar disorder, unspecified: Secondary | ICD-10-CM | POA: Diagnosis not present

## 2024-03-03 DIAGNOSIS — H5500 Unspecified nystagmus: Secondary | ICD-10-CM | POA: Diagnosis present

## 2024-03-03 DIAGNOSIS — Z8249 Family history of ischemic heart disease and other diseases of the circulatory system: Secondary | ICD-10-CM

## 2024-03-03 DIAGNOSIS — R933 Abnormal findings on diagnostic imaging of other parts of digestive tract: Secondary | ICD-10-CM

## 2024-03-03 DIAGNOSIS — R011 Cardiac murmur, unspecified: Secondary | ICD-10-CM | POA: Diagnosis not present

## 2024-03-03 DIAGNOSIS — F4310 Post-traumatic stress disorder, unspecified: Secondary | ICD-10-CM | POA: Diagnosis present

## 2024-03-03 DIAGNOSIS — E876 Hypokalemia: Secondary | ICD-10-CM | POA: Diagnosis present

## 2024-03-03 DIAGNOSIS — K449 Diaphragmatic hernia without obstruction or gangrene: Secondary | ICD-10-CM | POA: Diagnosis present

## 2024-03-03 DIAGNOSIS — K3189 Other diseases of stomach and duodenum: Secondary | ICD-10-CM | POA: Diagnosis present

## 2024-03-03 DIAGNOSIS — B9689 Other specified bacterial agents as the cause of diseases classified elsewhere: Secondary | ICD-10-CM | POA: Diagnosis present

## 2024-03-03 DIAGNOSIS — Z91013 Allergy to seafood: Secondary | ICD-10-CM

## 2024-03-03 DIAGNOSIS — F1090 Alcohol use, unspecified, uncomplicated: Secondary | ICD-10-CM | POA: Diagnosis not present

## 2024-03-03 DIAGNOSIS — A4189 Other specified sepsis: Principal | ICD-10-CM | POA: Diagnosis present

## 2024-03-03 DIAGNOSIS — Z86718 Personal history of other venous thrombosis and embolism: Secondary | ICD-10-CM

## 2024-03-03 DIAGNOSIS — R9431 Abnormal electrocardiogram [ECG] [EKG]: Secondary | ICD-10-CM | POA: Diagnosis not present

## 2024-03-03 DIAGNOSIS — I330 Acute and subacute infective endocarditis: Secondary | ICD-10-CM | POA: Diagnosis present

## 2024-03-03 DIAGNOSIS — M1812 Unilateral primary osteoarthritis of first carpometacarpal joint, left hand: Secondary | ICD-10-CM | POA: Diagnosis present

## 2024-03-03 DIAGNOSIS — I700 Atherosclerosis of aorta: Secondary | ICD-10-CM | POA: Diagnosis present

## 2024-03-03 DIAGNOSIS — E871 Hypo-osmolality and hyponatremia: Secondary | ICD-10-CM | POA: Diagnosis present

## 2024-03-03 DIAGNOSIS — I340 Nonrheumatic mitral (valve) insufficiency: Secondary | ICD-10-CM | POA: Diagnosis not present

## 2024-03-03 DIAGNOSIS — K851 Biliary acute pancreatitis without necrosis or infection: Secondary | ICD-10-CM | POA: Diagnosis present

## 2024-03-03 DIAGNOSIS — K31819 Angiodysplasia of stomach and duodenum without bleeding: Secondary | ICD-10-CM | POA: Diagnosis not present

## 2024-03-03 DIAGNOSIS — Z91010 Allergy to peanuts: Secondary | ICD-10-CM

## 2024-03-03 DIAGNOSIS — D649 Anemia, unspecified: Secondary | ICD-10-CM | POA: Diagnosis not present

## 2024-03-03 DIAGNOSIS — Z886 Allergy status to analgesic agent status: Secondary | ICD-10-CM

## 2024-03-03 DIAGNOSIS — B954 Other streptococcus as the cause of diseases classified elsewhere: Secondary | ICD-10-CM

## 2024-03-03 DIAGNOSIS — Z833 Family history of diabetes mellitus: Secondary | ICD-10-CM

## 2024-03-03 DIAGNOSIS — M1731 Unilateral post-traumatic osteoarthritis, right knee: Secondary | ICD-10-CM | POA: Diagnosis present

## 2024-03-03 DIAGNOSIS — K2100 Gastro-esophageal reflux disease with esophagitis, without bleeding: Secondary | ICD-10-CM | POA: Diagnosis present

## 2024-03-03 HISTORY — DX: Abnormal findings on diagnostic imaging of other parts of digestive tract: R93.3

## 2024-03-03 LAB — CBC WITH DIFFERENTIAL/PLATELET
Abs Immature Granulocytes: 0.12 K/uL — ABNORMAL HIGH (ref 0.00–0.07)
Basophils Absolute: 0 K/uL (ref 0.0–0.1)
Basophils Relative: 1 %
Eosinophils Absolute: 0.1 K/uL (ref 0.0–0.5)
Eosinophils Relative: 1 %
HCT: 24.2 % — ABNORMAL LOW (ref 39.0–52.0)
Hemoglobin: 7.6 g/dL — ABNORMAL LOW (ref 13.0–17.0)
Immature Granulocytes: 1 %
Lymphocytes Relative: 5 %
Lymphs Abs: 0.4 K/uL — ABNORMAL LOW (ref 0.7–4.0)
MCH: 26.9 pg (ref 26.0–34.0)
MCHC: 31.4 g/dL (ref 30.0–36.0)
MCV: 85.5 fL (ref 80.0–100.0)
Monocytes Absolute: 0.7 K/uL (ref 0.1–1.0)
Monocytes Relative: 8 %
Neutro Abs: 7.2 K/uL (ref 1.7–7.7)
Neutrophils Relative %: 84 %
Platelets: 57 K/uL — ABNORMAL LOW (ref 150–400)
RBC: 2.83 MIL/uL — ABNORMAL LOW (ref 4.22–5.81)
RDW: 23.4 % — ABNORMAL HIGH (ref 11.5–15.5)
WBC: 8.5 K/uL (ref 4.0–10.5)
nRBC: 0 % (ref 0.0–0.2)

## 2024-03-03 LAB — COMPREHENSIVE METABOLIC PANEL WITH GFR
ALT: 25 U/L (ref 0–44)
ALT: 26 U/L (ref 0–44)
AST: 61 U/L — ABNORMAL HIGH (ref 15–41)
AST: 61 U/L — ABNORMAL HIGH (ref 15–41)
Albumin: 1.7 g/dL — ABNORMAL LOW (ref 3.5–5.0)
Albumin: 1.6 g/dL — ABNORMAL LOW (ref 3.5–5.0)
Alkaline Phosphatase: 66 U/L (ref 38–126)
Alkaline Phosphatase: 61 U/L (ref 38–126)
Anion gap: 10 (ref 5–15)
Anion gap: 8 (ref 5–15)
BUN: 9 mg/dL (ref 6–20)
BUN: 9 mg/dL (ref 6–20)
CO2: 14 mmol/L — ABNORMAL LOW (ref 22–32)
CO2: 17 mmol/L — ABNORMAL LOW (ref 22–32)
Calcium: 7.8 mg/dL — ABNORMAL LOW (ref 8.9–10.3)
Calcium: 8.2 mg/dL — ABNORMAL LOW (ref 8.9–10.3)
Chloride: 108 mmol/L (ref 98–111)
Chloride: 106 mmol/L (ref 98–111)
Creatinine, Ser: 1.19 mg/dL (ref 0.61–1.24)
Creatinine, Ser: 1.22 mg/dL (ref 0.61–1.24)
GFR, Estimated: >60 mL/min (ref >60)
GFR, Estimated: >60 mL/min (ref >60)
Glucose, Bld: 101 mg/dL — ABNORMAL HIGH (ref 70–99)
Glucose, Bld: 94 mg/dL (ref 70–99)
Potassium: 3.2 mmol/L — ABNORMAL LOW (ref 3.5–5.1)
Potassium: 3.4 mmol/L — ABNORMAL LOW (ref 3.5–5.1)
Sodium: 132 mmol/L — ABNORMAL LOW (ref 135–145)
Sodium: 131 mmol/L — ABNORMAL LOW (ref 135–145)
Total Bilirubin: 3.7 mg/dL — ABNORMAL HIGH (ref 0.0–1.2)
Total Bilirubin: 4.3 mg/dL — ABNORMAL HIGH (ref 0.0–1.2)
Total Protein: 4.5 g/dL — ABNORMAL LOW (ref 6.5–8.1)
Total Protein: 4.4 g/dL — ABNORMAL LOW (ref 6.5–8.1)

## 2024-03-03 LAB — ECHOCARDIOGRAM COMPLETE
AR max vel: 1.24 cm2
AV Area VTI: 1.29 cm2
AV Area mean vel: 1.29 cm2
AV Mean grad: 16 mmHg
AV Peak grad: 29 mmHg
Ao pk vel: 2.69 m/s
Area-P 1/2: 3.37 cm2
Calc EF: 73.2 %
S' Lateral: 2.4 cm
Single Plane A2C EF: 74.1 %
Single Plane A4C EF: 72.5 %

## 2024-03-03 LAB — RETICULOCYTES
Immature Retic Fract: 32.4 % — ABNORMAL HIGH (ref 2.3–15.9)
RBC.: 2.96 MIL/uL — ABNORMAL LOW (ref 4.22–5.81)
Retic Count, Absolute: 82.9 K/uL (ref 19.0–186.0)
Retic Ct Pct: 2.8 % (ref 0.4–3.1)

## 2024-03-03 LAB — BASIC METABOLIC PANEL WITH GFR
Anion gap: 12 (ref 5–15)
BUN: 10 mg/dL (ref 6–20)
CO2: 16 mmol/L — ABNORMAL LOW (ref 22–32)
Calcium: 8.1 mg/dL — ABNORMAL LOW (ref 8.9–10.3)
Chloride: 106 mmol/L (ref 98–111)
Creatinine, Ser: 1.16 mg/dL (ref 0.61–1.24)
GFR, Estimated: >60 mL/min (ref >60)
Glucose, Bld: 95 mg/dL (ref 70–99)
Potassium: 3.5 mmol/L (ref 3.5–5.1)
Sodium: 134 mmol/L — ABNORMAL LOW (ref 135–145)

## 2024-03-03 LAB — FOLATE: Folate: 19.7 ng/mL (ref >5.9)

## 2024-03-03 LAB — CBC
HCT: 20.8 % — ABNORMAL LOW (ref 39.0–52.0)
Hemoglobin: 6.7 g/dL — CL (ref 13.0–17.0)
MCH: 27.3 pg (ref 26.0–34.0)
MCHC: 32.2 g/dL (ref 30.0–36.0)
MCV: 84.9 fL (ref 80.0–100.0)
Platelets: 56 K/uL — ABNORMAL LOW (ref 150–400)
RBC: 2.45 MIL/uL — ABNORMAL LOW (ref 4.22–5.81)
RDW: 23.6 % — ABNORMAL HIGH (ref 11.5–15.5)
WBC: 17.3 K/uL — ABNORMAL HIGH (ref 4.0–10.5)
nRBC: 0 % (ref 0.0–0.2)

## 2024-03-03 LAB — CULTURE, BLOOD (SINGLE)

## 2024-03-03 LAB — I-STAT CG4 LACTIC ACID, ED
Lactic Acid, Venous: 4.3 mmol/L (ref 0.5–1.9)
Lactic Acid, Venous: 3.1 mmol/L (ref 0.5–1.9)

## 2024-03-03 LAB — RESP PANEL BY RT-PCR (RSV, FLU A&B, COVID)  RVPGX2
Influenza A by PCR: NEGATIVE
Influenza B by PCR: NEGATIVE
Resp Syncytial Virus by PCR: NEGATIVE
SARS Coronavirus 2 by RT PCR: NEGATIVE

## 2024-03-03 LAB — PREPARE RBC (CROSSMATCH)

## 2024-03-03 LAB — URINALYSIS, W/ REFLEX TO CULTURE (INFECTION SUSPECTED)
Bacteria, UA: NONE SEEN
Bilirubin Urine: NEGATIVE
Glucose, UA: NEGATIVE mg/dL
Ketones, ur: NEGATIVE mg/dL
Leukocytes,Ua: NEGATIVE
Nitrite: NEGATIVE
Protein, ur: NEGATIVE mg/dL
RBC / HPF: >50 RBC/hpf (ref 0–5)
Specific Gravity, Urine: 1.013 (ref 1.005–1.030)
pH: 6 (ref 5.0–8.0)

## 2024-03-03 LAB — HIV ANTIBODY (ROUTINE TESTING W REFLEX): HIV Screen 4th Generation wRfx: NONREACTIVE

## 2024-03-03 LAB — IRON AND TIBC
Iron: 23 ug/dL — ABNORMAL LOW (ref 45–182)
Saturation Ratios: 10 % — ABNORMAL LOW (ref 17.9–39.5)
TIBC: 238 ug/dL — ABNORMAL LOW (ref 250–450)
UIBC: 215 ug/dL

## 2024-03-03 LAB — AMMONIA: Ammonia: 30 umol/L (ref 9–35)

## 2024-03-03 LAB — TRIGLYCERIDES: Triglycerides: 61 mg/dL (ref <150)

## 2024-03-03 LAB — VITAMIN B12: Vitamin B-12: 3360 pg/mL — ABNORMAL HIGH (ref 180–914)

## 2024-03-03 LAB — FERRITIN: Ferritin: 41 ng/mL (ref 24–336)

## 2024-03-03 LAB — PROTIME-INR
INR: 2.4 — ABNORMAL HIGH (ref 0.8–1.2)
Prothrombin Time: 26.9 s — ABNORMAL HIGH (ref 11.4–15.2)

## 2024-03-03 LAB — BRAIN NATRIURETIC PEPTIDE: B Natriuretic Peptide: 550.7 pg/mL — ABNORMAL HIGH (ref 0.0–100.0)

## 2024-03-03 LAB — C-REACTIVE PROTEIN: CRP: 3 mg/dL — ABNORMAL HIGH (ref <1.0)

## 2024-03-03 LAB — LIPASE, BLOOD: Lipase: 28 U/L (ref 11–51)

## 2024-03-03 MED ORDER — VANCOMYCIN HCL IN DEXTROSE 1-5 GM/200ML-% IV SOLN: 1000.0000 mg | Freq: Once | INTRAVENOUS | Status: DC

## 2024-03-03 MED ORDER — VANCOMYCIN HCL 1500 MG/300ML IV SOLN
1500.0000 mg | INTRAVENOUS | Status: DC
  Administered 2024-03-03: 1500 mg via INTRAVENOUS
  Filled 2024-03-03 (×2): qty 300

## 2024-03-03 MED ORDER — IOHEXOL 350 MG/ML SOLN
75.0000 mL | Freq: Once | INTRAVENOUS | Status: AC | PRN
  Administered 2024-03-03: 75 mL via INTRAVENOUS

## 2024-03-03 MED ORDER — VANCOMYCIN HCL 1500 MG/300ML IV SOLN
1500.0000 mg | Freq: Once | INTRAVENOUS | Status: AC
  Administered 2024-03-03: 1500 mg via INTRAVENOUS
  Filled 2024-03-03: qty 300

## 2024-03-03 MED ORDER — LACTATED RINGERS IV SOLN: INTRAVENOUS | Status: DC

## 2024-03-03 MED ORDER — ACETAMINOPHEN 650 MG RE SUPP: 650.0000 mg | Freq: Four times a day (QID) | RECTAL | Status: DC | PRN

## 2024-03-03 MED ORDER — GADOBUTROL 1 MMOL/ML IV SOLN
8.0000 mL | Freq: Once | INTRAVENOUS | Status: AC | PRN
  Administered 2024-03-03: 8 mL via INTRAVENOUS

## 2024-03-03 MED ORDER — FUROSEMIDE 10 MG/ML IJ SOLN
40.0000 mg | Freq: Every day | INTRAMUSCULAR | Status: DC
  Administered 2024-03-04: 40 mg via INTRAVENOUS
  Filled 2024-03-03: qty 4

## 2024-03-03 MED ORDER — METRONIDAZOLE 500 MG/100ML IV SOLN
500.0000 mg | Freq: Two times a day (BID) | INTRAVENOUS | Status: DC
  Administered 2024-03-03 (×2): 500 mg via INTRAVENOUS
  Filled 2024-03-03 (×2): qty 100

## 2024-03-03 MED ORDER — ACETAMINOPHEN 325 MG PO TABS: 650.0000 mg | ORAL_TABLET | Freq: Four times a day (QID) | ORAL | Status: DC | PRN

## 2024-03-03 MED ORDER — METRONIDAZOLE 500 MG/100ML IV SOLN
500.0000 mg | Freq: Once | INTRAVENOUS | Status: AC
  Administered 2024-03-03: 500 mg via INTRAVENOUS
  Filled 2024-03-03: qty 100

## 2024-03-03 MED ORDER — LACTATED RINGERS IV BOLUS
500.0000 mL | Freq: Once | INTRAVENOUS | Status: AC
  Administered 2024-03-03: 500 mL via INTRAVENOUS

## 2024-03-03 MED ORDER — PANTOPRAZOLE SODIUM 40 MG IV SOLR
40.0000 mg | Freq: Two times a day (BID) | INTRAVENOUS | Status: DC
  Administered 2024-03-03 – 2024-03-06 (×8): 40 mg via INTRAVENOUS
  Filled 2024-03-03 (×7): qty 10

## 2024-03-03 MED ORDER — LACTATED RINGERS IV BOLUS
1000.0000 mL | Freq: Once | INTRAVENOUS | Status: DC
Start: 2024-03-03 — End: 2024-03-03

## 2024-03-03 MED ORDER — SODIUM CHLORIDE 0.9% IV SOLUTION: Freq: Once | INTRAVENOUS | Status: DC

## 2024-03-03 MED ORDER — SODIUM CHLORIDE 0.9% IV SOLUTION: Freq: Once | INTRAVENOUS | Status: AC

## 2024-03-03 MED ORDER — SODIUM CHLORIDE 0.9 % IV SOLN: 2.0000 g | Freq: Three times a day (TID) | INTRAVENOUS | Status: DC

## 2024-03-03 MED ORDER — SODIUM CHLORIDE 0.9 % IV SOLN
2.0000 g | Freq: Three times a day (TID) | INTRAVENOUS | Status: DC
  Administered 2024-03-03 (×3): 2 g via INTRAVENOUS
  Filled 2024-03-03 (×3): qty 12.5

## 2024-03-03 MED ORDER — ALBUTEROL SULFATE (2.5 MG/3ML) 0.083% IN NEBU: 2.5000 mg | INHALATION_SOLUTION | RESPIRATORY_TRACT | Status: DC | PRN

## 2024-03-03 MED ORDER — LACTATED RINGERS IV BOLUS (SEPSIS)
1000.0000 mL | Freq: Once | INTRAVENOUS | Status: AC
  Administered 2024-03-03: 1000 mL via INTRAVENOUS

## 2024-03-03 MED ORDER — SODIUM CHLORIDE 0.9 % IV SOLN
2.0000 g | Freq: Once | INTRAVENOUS | Status: AC
  Administered 2024-03-03: 2 g via INTRAVENOUS
  Filled 2024-03-03: qty 10

## 2024-03-03 NOTE — Progress Notes (Signed)
 Pharmacy Antibiotic Note  Juan [name] is a 58 y.o. male admitted on 03/03/2024 with bacteremia and sepsis.  Pharmacy has been consulted for Vancomycin  dosing. WBC WNL. Renal function ok. Recent bacteremia with gram+ cocci in pairs. Blood culture from hospital in [place]  with strep paragsanguinis. ID was planning continuous Pen G for 6 weeks. Looks like he had been started on PO Zyvox  while awaiting PICC line placement on 7/15. Comes in to the ED with fever.   Plan: Vancomycin  1500 mg IV q24h >>>Estimated AUC: 472 Cefepime  2g IV q8h Trend WBC, temp, renal function  F/U infectious work-up Drug levels as indicated   Temp (24hrs), Avg:101 F (38.3 C), Min:101 F (38.3 C), Max:101 F (38.3 C)  Recent Labs  Lab 02/27/24 1457 02/28/24 0300 02/29/24 1544 03/03/24 0101 03/03/24 0127 03/03/24 0350  WBC 13.4*  --  10.1 8.5  --   --   CREATININE 1.21 1.09 1.42* 1.19  --   --   LATICACIDVEN  --   --   --   --  4.3* 3.1*    Estimated Creatinine Clearance: 67.8 mL/min (by C-G formula based on SCr of 1.19 mg/dL).    Allergies  Allergen Reactions   Apple Juice Swelling and Other (See Comments)    Tongue swelling   Cucumber Extract Itching and Nausea And Vomiting    No extracts; just cucumber   Depakote Er [Divalproex Sodium Er] Swelling and Other (See Comments)    Tongue swelling   Depakote [Valproic Acid] Anaphylaxis   Peanut Butter Flavoring Agent (Non-Screening) Anaphylaxis and Swelling   Peanut Oil Swelling   Peanut-Containing Drug Products Swelling   Shellfish Allergy Itching and Swelling   Shrimp Extract Itching and Swelling   Apple Swelling   Cantaloupe Extract Allergy Skin Test Rash   Codeine Itching, Rash and Other (See Comments)    Patient reports he can take CODONES without problems   Firvanq  [Vancomycin ] Rash   Lactose Intolerance (Gi) Diarrhea and Other (See Comments)    Indigestion, Stomach pain, Flatulence    Lynwood Mckusick, PharmD, BCPS Clinical  Pharmacist Phone: [phone]

## 2024-03-03 NOTE — ED Triage Notes (Signed)
 Pt recently in the hospital and had an office visit with his pcp and had blood cultures drawn. Pts slid out of bed tonight and wife noticed he had a fever when she tried to get him up. Pt had positive cultures

## 2024-03-03 NOTE — Progress Notes (Signed)
 [facility] night cross cover note:   I was notified by the patient's RN of patient's morning hemoglobin level is 6.7, which is slightly lower relative to most recent prior value of 7.6.  He has an active order for type and screen and additional anemia labs are currently pending, including iron studies, reticulocyte count, folic acid , B12 level.  Most recent heart rates in the 80s, with most recent systolic blood pressures in the 90s to low 100s, with maps greater than 65 mmHg.   I subsequently ordered transfusion of 1 unit PRBC over 3 hours and ordered a repeat H&H to be checked after completion of transfusion of this 1 unit PRBC.    Eva Pore, DO Hospitalist

## 2024-03-03 NOTE — Consult Note (Addendum)
 Consultation Note   Referring Provider:  [facility] PCP: Alvia Bring, DO Primary Gastroenterologist:    Rosario JAYSON Kidney, MD   Reason for Consultation:   Cirrhosis, multiple MRCP abnormalities, anemia  DOA: 03/03/2024         Hospital Day: 1   ASSESSMENT    58 yo male with Etoh cirrhosis with portal hypertension. Recent hospitalization in June in [place] for fevers / bacteremia. Details not available as to source since being followed by [facility] and being treated with antibiotics to cover for possible endocarditis. Admitted today for recurrent fevers. Abdominal imaging with multiple findings  including acute pancreatitis, cholelithiasis concern for acute cholecystitis, possible choledocholithiasis, hyperenhancement of the cecum ( possible colopathy).  Despite all these findings, patient has no GI symptoms , he is non-tender on exam, lipase is normal.   Fever / leukocytosis Admission to outside facility in [place]  in June for fever /bacteremia.  Sounds like source was not determined. Has since been undergoing workup and treatment in the outpatient setting with [facility]. There has been some concern for endocarditis but was too far out from symptoms for echo to be helpful. Blood cx on 7/9 showed gram positive cocci in pairs and chains.  Antibiotic changed to Zyvox . Came to ED today for recurrent fevers.  Updated blood cultures are pending .  UA does not look suspicious.   Doubt cholangitis despite that CBD stone couldn't be ruled out on MRCP.  Additionally, acute cholecystitis seems unlikely.  He is nontender .   Acute on chronic anemia, likely combination of acute illness and ? occult GI bleeding from known portal gastropathy.  No overt GI bleeding.  Getting a unit of blood now  Pancreatic lesion on MRI  11 x 11 mm cystic lesion in the body of pancreas. 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   See PMH for  additional history  Principal Problem:   Bacteremia      PLAN:   --Blood cultures are pending --ID has been consulted  --Echocardiogram in progress --There is only trace perihepatic ascites on ultrasound today likely insufficient amount for diagnostic paracentesis to evaluate for SBP.   HPI   58 y.o. year old male with a complex medical history including but not limited to alcoholic cirrhosis, cholelithiasis, C. difficile infection , hiatal hernia, GERD with grade C esophagitis, aortic stenosis  Brief History  Patient was last seen in our office February 2025 for follow-up on alcoholic cirrhosis.  At that time he had had several admissions to the hospital for pneumonia, bacteremia, UTI, etc..  In the office he had significant bilateral lower extremity edema.  His MELD was 30 so we advised him to follow back up with [facility] transplant.  Previously seen but deemed not a liver transplant candidate due to ongoing EtOH consumption.  We increased Lasix  and Aldactone .  Next variceal screening would be due December 2025.  He was asked to follow-up in 2 months but no has been seen since in our office.  [facility] are [facility] that are not visible. He did see [facility] on 11/24/2023 for evaluation of cholelithiasis and intermittent vomiting.  Surgery did not feel vomiting was related to  cholelithiasis and he was too high risk for surgery.   INTERVAL HISTORY:  Since we last saw patient in February 2025 he tells me that he has been rehospitalized again at [facility] in [place]) for fevers .  He is unable to provide any details but sounds like source of fever was not found.  Unable to find records in care everywhere but apparently he was treated with several weeks of antibiotics.    Back in [place] his PCP referred him to ID who saw him on 7/10. ID contacted hospital in [place]  and determined that biopsies were positive for strep parasanguinis . ID had some concern for endocarditis  but TEE not done. ID it was felt that since onset of symptoms dated back to June TEE might miss broken off vegetation. Therefore plan was just to treat as if he had endocarditis.  He was to have PICC line placed and start on IV antibiotics as well as PO Vanco for 2/2 prophylaxis ( ? C-diff prophylaxis)  Patient presented to the ED today for recurrent fevers.  He has no localizing symptoms.  Specifically, no abdominal pain, nausea nor vomiting.  No cough.  No urinary symptoms.  No bowel changes.  He does endorse some increased abdominal girth and lower extremity edema but tells me that PCP recently reduced dose of diuretics.  He follows a low-salt diet.    In the ED his his temp was 101 , lactic acid 4.3.  WBC 17.3.  Presenting hemoglobin 7.6, down to 6.7 today (baseline hemoglobin around 8), platelets 56, potassium 3.2, normal alkaline phosphatase. INR 2.4. Lipase 11. Renal function is normal .  CT chest abdomen and pelvis Diffuse bilateral bronchial wall thickening and interlobular septal thickening throughout the lung bases consistent with pulmonary edema.  Diffuse inflammatory fat stranding and fluid about the pancreas.  No pancreatic duct dilation or acute pancreatic fluid collection.  Recannulization of the umbilical vein with ventral abdominal and left upper quadrant varices.  Cholelithiasis.  Gallbladder wall thickening and trace pericholecystic fluid concerning for cholecystitis.  Diffuse wall thickening and mucosal hyperenhancement of the cecum likely secondary to portal colopathy.   MRI  / MRCP IMPRESSION: 1. Pancreatic and peripancreatic edema, compatible with the reported clinical history of pancreatitis. 2. Cholelithiasis with gallbladder wall edema/thickening, compatible with cholecystitis. Gallbladder wall thickening can also be seen in the setting of liver insufficiency/systemic disease. MRCP imaging is markedly motion degraded limiting assessment. While no  definite choledocholithiasis on MRCP images, axial T2 imaging raises the question of a 3 mm stone at the ampulla. ERCP could be used to further evaluate. 3. 11 x 11 mm cystic lesion in the body of pancreas. 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 Follow-up MRI abdomen with and without contrast recommended in 1 year. This recommendation follows ACR consensus guidelines: Management of Incidental Pancreatic Cysts: A White Paper of the ACR Incidental Findings Committee. J Am Coll Radiol 2017;14:911-923. 4. Cirrhosis with evidence of portal hypertension including recanalization of the paraumbilical vein and prominent left abdominal varices.  Labs and Imaging:  Recent Labs    02/29/24 1544 03/03/24 0101 03/03/24 0505  PROT 5.5* 4.5* 4.4*  ALBUMIN 2.7* 1.7* 1.6*  AST 72* 61* 61*  ALT 29 25 26   ALKPHOS 112 66 61  BILITOT 4.4* 3.7* 4.3*   Recent Labs    02/29/24 1544 03/03/24 0101 03/03/24 0101 03/03/24 0505  WBC 10.1 8.5  --  17.3*  HGB 8.0* 7.6*  --  6.7*  HCT 24.7* 24.2*  --  20.8*  MCV 82 85.5   < > 84.9  PLT 66* 57*  --  56*   < > = values in this interval not displayed.   Recent Labs    02/29/24 1544 03/03/24 0101 03/03/24 0505  NA 132* 132* 131*  K 3.7 3.2* 3.4*  CL 101 108 106  CO2 17* 14* 17*  GLUCOSE 131* 101* 94  BUN 16 9 9   CREATININE 1.42* 1.19 1.22  CALCIUM  8.7 7.8* 8.2*     US  Abdomen Limited RUQ (LIVER/GB) CLINICAL DATA:  6194 Acute cholecystitis 6194  EXAM: ULTRASOUND ABDOMEN LIMITED RIGHT UPPER QUADRANT  COMPARISON:  March 03, 2024  FINDINGS: Gallbladder:  Cholelithiasis. There is circumferential gallbladder wall thickening to 4 mm. There is trace pericholecystic fluid. Gallbladder wall appears edematous. No sonographic Beverley sign is reported.  Common bile duct:  Diameter: Visualized portion measures 2 mm, within normal limits. Majority is not visualized.  Liver:  No focal lesion identified. Heterogeneous and  coarsened parenchymal echogenicity. Nodular liver contour. Portal vein is grossly patent on color Doppler imaging with normal direction of blood flow towards the liver is poorly visualized.  Other: Small volume ascites.  IMPRESSION: 1. Cholelithiasis with circumferential gallbladder wall thickening and trace pericholecystic fluid. Findings are equivocal for acute cholecystitis in the setting of cirrhosis as well as reported pancreatitis. If further imaging is desired, HIDA scan could differentiate. 2. Cirrhotic liver morphology. 3. Small volume ascites.  Electronically Signed   By: Corean Salter M.D.   On: 03/03/2024 09:13 MR 3D Recon At Scanner CLINICAL DATA:  Acute pancreatitis.  EXAM: MRI ABDOMEN WITHOUT AND WITH CONTRAST (INCLUDING MRCP)  TECHNIQUE: Multiplanar multisequence MR imaging of the abdomen was performed both before and after the administration of intravenous contrast. Heavily T2-weighted images of the biliary and pancreatic ducts were obtained, and three-dimensional MRCP images were rendered by post processing.  CONTRAST:  8mL GADAVIST  GADOBUTROL  1 MMOL/ML IV SOLN  COMPARISON:  CT scan earlier same day  FINDINGS: Lower chest: Dependent atelectasis.  Hepatobiliary: Nodular liver contour compatible cirrhosis. No arterial phase hyperenhancement or focal restricted diffusion within the liver parenchyma. Tiny hepatic cysts are seen in both hepatic lobes. Gallbladder is distended with gallbladder wall edema/thickening. Numerous layering tiny 1-3 mm gallstones evident. MRCP imaging is markedly motion degraded limiting assessment. While no definite choledocholithiasis on MRCP images, axial T2 haste imaging (23/3) raises the question of a 3 mm stone at the ampulla. Common duct measures approximately 7 mm diameter. Common bile duct measures approximately 5 mm diameter.  Pancreas: Pancreatic and peripancreatic edema evident, compatible with the reported  clinical history of pancreatitis. 11 x 11 mm cystic lesion is identified in the body of pancreas (18/3)  Spleen:  No splenomegaly. No suspicious focal mass lesion.  Adrenals/Urinary Tract: No adrenal nodule or mass. Tiny T2 hyperintensities without enhancement in both kidneys are too small to characterize but statistically most likely benign cyst. No followup imaging is recommended.  Stomach/Bowel: Stomach is decompressed. Duodenum is normally positioned as is the ligament of Treitz. No small bowel or colonic dilatation within the visualized abdomen.  Vascular/Lymphatic: No abdominal aortic aneurysm. No abdominal aortic atherosclerotic calcification. Portal vein is patent. Recanalization of the paraumbilical vein evident. Prominent left abdominal varices. There is no gastrohepatic or hepatoduodenal ligament lymphadenopathy. No retroperitoneal or mesenteric lymphadenopathy.  Other: No substantial intraperitoneal free fluid although there is some trace fluid around the liver and in the para colic gutters.  Musculoskeletal: No  focal suspicious marrow enhancement within the visualized bony anatomy.  IMPRESSION: 1. Pancreatic and peripancreatic edema, compatible with the reported clinical history of pancreatitis. 2. Cholelithiasis with gallbladder wall edema/thickening, compatible with cholecystitis. Gallbladder wall thickening can also be seen in the setting of liver insufficiency/systemic disease. MRCP imaging is markedly motion degraded limiting assessment. While no definite choledocholithiasis on MRCP images, axial T2 imaging raises the question of a 3 mm stone at the ampulla. ERCP could be used to further evaluate. 3. 11 x 11 mm cystic lesion in the body of pancreas. 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 Follow-up MRI abdomen with and without contrast recommended in 1 year. This recommendation follows ACR consensus guidelines: Management of Incidental  Pancreatic Cysts: A White Paper of the ACR Incidental Findings Committee. J Am Coll Radiol 2017;14:911-923. 4. Cirrhosis with evidence of portal hypertension including recanalization of the paraumbilical vein and prominent left abdominal varices.  Electronically Signed   By: Camellia Candle M.D.   On: 03/03/2024 09:07 MR ABDOMEN MRCP W WO CONTAST CLINICAL DATA:  Acute pancreatitis.  EXAM: MRI ABDOMEN WITHOUT AND WITH CONTRAST (INCLUDING MRCP)  TECHNIQUE: Multiplanar multisequence MR imaging of the abdomen was performed both before and after the administration of intravenous contrast. Heavily T2-weighted images of the biliary and pancreatic ducts were obtained, and three-dimensional MRCP images were rendered by post processing.  CONTRAST:  8mL GADAVIST  GADOBUTROL  1 MMOL/ML IV SOLN  COMPARISON:  CT scan earlier same day  FINDINGS: Lower chest: Dependent atelectasis.  Hepatobiliary: Nodular liver contour compatible cirrhosis. No arterial phase hyperenhancement or focal restricted diffusion within the liver parenchyma. Tiny hepatic cysts are seen in both hepatic lobes. Gallbladder is distended with gallbladder wall edema/thickening. Numerous layering tiny 1-3 mm gallstones evident. MRCP imaging is markedly motion degraded limiting assessment. While no definite choledocholithiasis on MRCP images, axial T2 haste imaging (23/3) raises the question of a 3 mm stone at the ampulla. Common duct measures approximately 7 mm diameter. Common bile duct measures approximately 5 mm diameter.  Pancreas: Pancreatic and peripancreatic edema evident, compatible with the reported clinical history of pancreatitis. 11 x 11 mm cystic lesion is identified in the body of pancreas (18/3)  Spleen:  No splenomegaly. No suspicious focal mass lesion.  Adrenals/Urinary Tract: No adrenal nodule or mass. Tiny T2 hyperintensities without enhancement in both kidneys are too small to characterize but  statistically most likely benign cyst. No followup imaging is recommended.  Stomach/Bowel: Stomach is decompressed. Duodenum is normally positioned as is the ligament of Treitz. No small bowel or colonic dilatation within the visualized abdomen.  Vascular/Lymphatic: No abdominal aortic aneurysm. No abdominal aortic atherosclerotic calcification. Portal vein is patent. Recanalization of the paraumbilical vein evident. Prominent left abdominal varices. There is no gastrohepatic or hepatoduodenal ligament lymphadenopathy. No retroperitoneal or mesenteric lymphadenopathy.  Other: No substantial intraperitoneal free fluid although there is some trace fluid around the liver and in the para colic gutters.  Musculoskeletal: No focal suspicious marrow enhancement within the visualized bony anatomy.  IMPRESSION: 1. Pancreatic and peripancreatic edema, compatible with the reported clinical history of pancreatitis. 2. Cholelithiasis with gallbladder wall edema/thickening, compatible with cholecystitis. Gallbladder wall thickening can also be seen in the setting of liver insufficiency/systemic disease. MRCP imaging is markedly motion degraded limiting assessment. While no definite choledocholithiasis on MRCP images, axial T2 imaging raises the question of a 3 mm stone at the ampulla. ERCP could be used to further evaluate. 3. 11 x 11 mm cystic lesion in the body  of pancreas. 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 Follow-up MRI abdomen with and without contrast recommended in 1 year. This recommendation follows ACR consensus guidelines: Management of Incidental Pancreatic Cysts: A White Paper of the ACR Incidental Findings Committee. J Am Coll Radiol 2017;14:911-923. 4. Cirrhosis with evidence of portal hypertension including recanalization of the paraumbilical vein and prominent left abdominal varices.  Electronically Signed   By: Camellia Candle M.D.   On: 03/03/2024  09:07 CT CHEST ABDOMEN PELVIS W CONTRAST CLINICAL DATA:  Sepsis  EXAM: CT CHEST, ABDOMEN, AND PELVIS WITH CONTRAST  TECHNIQUE: Multidetector CT imaging of the chest, abdomen and pelvis was performed following the standard protocol during bolus administration of intravenous contrast.  RADIATION DOSE REDUCTION: This exam was performed according to the departmental dose-optimization program which includes automated exposure control, adjustment of the mA and/or kV according to patient size and/or use of iterative reconstruction technique.  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 Cardiomegaly. Left and right coronary artery calcifications. No pericardial effusion.  Mediastinum/Nodes: No enlarged mediastinal, hilar, or axillary lymph nodes. Thyroid  gland, trachea, and esophagus demonstrate no significant findings.  Lungs/Pleura: Trace bilateral pleural effusions and associated atelectasis or consolidation. Diffuse bilateral bronchial wall thickening and interlobular septal thickening throughout the lung bases.  Musculoskeletal: No chest wall abnormality. No acute osseous findings.  CT ABDOMEN PELVIS FINDINGS  Hepatobiliary: Coarse, nodular contour of the liver. Small gallstones. Gallbladder wall thickening and trace pericholecystic fluid. No biliary ductal dilatation.  Pancreas: Diffuse inflammatory fat stranding and fluid about the pancreas. No pancreatic ductal dilatation or acute pancreatic fluid collection.  Spleen: Normal in size without significant abnormality.  Adrenals/Urinary Tract: Adrenal glands are unremarkable. Kidneys are normal, without renal calculi, solid lesion, or hydronephrosis. Bladder is unremarkable.  Stomach/Bowel: Stomach is within normal limits. Appendix  appears normal. Diffuse wall thickening and mucosal hyperenhancement of the cecum (series 7, image 65).  Vascular/Lymphatic: Aortic atherosclerosis. Recanalization of the umbilical vein with ventral abdominal and left upper quadrant varices. No enlarged abdominal or pelvic lymph nodes.  Reproductive: No mass or other abnormality.  Other: No abdominal wall hernia or abnormality. Small volume perihepatic ascites.  Musculoskeletal: No acute osseous findings.  IMPRESSION: 1. Diffuse inflammatory fat stranding and fluid about the pancreas, consistent with acute pancreatitis. No pancreatic ductal dilatation or acute pancreatic fluid collection. 2. Cholelithiasis. Gallbladder wall thickening and trace pericholecystic fluid, concerning for cholecystitis although somewhat nonspecific in the setting of ascites. Overall constellation of findings could be explained by cholecystitis and gallstone pancreatitis. 3. Diffuse wall thickening and mucosal hyperenhancement of the cecum likely related to portal colopathy. 4. Cirrhosis. Small volume perihepatic ascites. Recanalization of the umbilical vein with ventral abdominal and left upper quadrant varices. 5. Trace bilateral pleural effusions and associated atelectasis or consolidation. Diffuse bilateral bronchial wall thickening and interlobular septal thickening throughout the lung bases. Findings consistent with pulmonary edema. 6. Cardiomegaly and coronary artery disease.  Aortic Atherosclerosis (ICD10-I70.0).  Electronically Signed   By: Marolyn JONETTA Jaksch M.D.   On: 03/03/2024 06:10 DG Chest Port 1 View CLINICAL DATA:  Possible sepsis  EXAM: PORTABLE CHEST 1 VIEW  COMPARISON:  02/20/2024  FINDINGS: Cardiac shadow is stable. Lungs are well aerated bilaterally. Mediastinal markings are somewhat accentuated secondary to patient motion artifact. No bony abnormality is seen.  IMPRESSION: No active disease.  Electronically Signed  By: Oneil Devonshire M.D.   On: 03/03/2024 01:31    Pertinent GI Studies    EGD 09/24/21: - LA Grade C esophagitis with no bleeding. Biopsied. - Medium-sized hiatal hernia. - Portal hypertensive gastropathy. - Erythematous mucosa in the antrum. Biopsied. - Normal examined duodenum. Path: . STOMACH, BIOPSY:  -  Reactive gastropathy with mild foveolar hyperplasia.  -  Suggestive of mild vascular ectasia, without thrombi.  -  Negative for an inflammatory infiltrate predictive of Helicobacter  pylori infection.  -  Negative for intestinal metaplasia.  -  Negative for malignancy.   B. ESOPHAGUS, BIOPSY:  -  Squamocolumnar mucosa with no specific pathologic diagnosis (scant  columnar epithelium present).  -  Negative for acute esophagitis and intrasquamous eosinophils.  -  Negative for intestinal metaplasia.  -  No viral cytopathic change or fungi identified (on HE).  -  Negative for dysplasia and malignancy  Colonoscopy 09/24/21: - The examined portion of the ileum was normal. - A tattoo was seen in the sigmoid colon. - Non-bleeding internal hemorrhoids.   EGD 11/20/21: - Normal esophagus. - Medium-sized hiatal hernia. - Portal hypertensive gastropathy. - Normal examined duodenum. - No specimens collected.   EGD 08/18/22: 1. Mild esophagitis 2. No varices 3. Mild to moderate portal hypertensive gastropathy 4. Otherwise unremarkable exam.   Flex sig 08/18/22: - Recently bleeding internal hemorrhoids, as described. - The examination was otherwise normal. - No specimens collected.   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  Chronic GERD with HH  09/2019--EGD--[facility], [place]--showed no varices; gr D esophagitis;  Rx: PPI daily     Eustachian tube dysfunction 03/02/2023   Febrile illness 06/27/2023   Fracture of laryngeal cartilage (HCC) 01/17/2020   Last Assessment & Plan:   Formatting of this note might be different from the original.  Emergency department follow-up for evaluation of laryngeal trauma.  CT obtained the night of the injury is reviewed independently and shows nondisplaced fracture of the thyroid  cartilage.  In general, his voice is much improved from the night of the trauma.  Denies any difficulty breathing.  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 07/11/2020   Lower extremity edema 09/06/2022   Lumbar degenerative disc disease 07/26/2022   Malnutrition of moderate degree 01/11/2022   MDD (major depressive disorder), recurrent severe, 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   Nonrheumatic aortic valve stenosis    Normocytic anemia 03/02/2022   Pain of left calf 04/11/2023   Pancytopenia (HCC) 09/18/2021   Portal hypertensive gastropathy (HCC)    Post-traumatic osteoarthritis of right knee 01/04/2023   Prolonged QT interval 05/05/2022   PTSD (post-traumatic stress disorder)    Rib pain on right side 10/31/2022   Right ankle sprain 02/12/2021   Right lower quadrant pain 04/17/2021   Right wrist injury 10/18/2023   Shoulder pain, left, posterior 12/22/2021   Systolic murmur 01/06/2021   Thrombocytopenia (HCC) 11/02/2020   Tibialis posterior tendinitis, right 04/14/2021   Tinea pedis 05/12/2021   Tobacco chew use 03/02/2022   Traumatic hemorrhagic shock (HCC)    Urinary frequency 03/23/2021   Well adult exam 01/06/2021    Past Surgical History:  Procedure Laterality Date   BIOPSY  09/24/2021   Procedure: BIOPSY;  Surgeon: Federico Rosario BROCKS, MD;  Location: [facility] ENDOSCOPY;  Service: Gastroenterology;;   ORIN MEDIATE RELEASE Bilateral    COLONOSCOPY WITH PROPOFOL  N/A 09/24/2021   Procedure: COLONOSCOPY WITH PROPOFOL ;  Surgeon: Federico Rosario BROCKS, MD;  Location: [facility] ENDOSCOPY;  Service: Gastroenterology;  Laterality: N/A;   ESOPHAGOGASTRODUODENOSCOPY (EGD) WITH PROPOFOL  N/A 09/24/2021   Procedure: ESOPHAGOGASTRODUODENOSCOPY (EGD) WITH PROPOFOL ;  Surgeon: Federico Rosario BROCKS, MD;  Location: [facility] ENDOSCOPY;  Service: Gastroenterology;  Laterality: N/A;   ESOPHAGOGASTRODUODENOSCOPY (EGD) WITH PROPOFOL  N/A 08/18/2022   Procedure: ESOPHAGOGASTRODUODENOSCOPY (EGD) WITH PROPOFOL ;  Surgeon: Abran Norleen SAILOR, MD;   Location: [facility] ENDOSCOPY;  Service: Gastroenterology;  Laterality: N/A;   FLEXIBLE SIGMOIDOSCOPY N/A 08/18/2022   Procedure: FLEXIBLE SIGMOIDOSCOPY;  Surgeon: Abran Norleen SAILOR, MD;  Location: [facility] ENDOSCOPY;  Service: Gastroenterology;  Laterality: N/A;   FRACTURE SURGERY     left ankle plate   HERNIA REPAIR     inguinal   KNEE SURGERY Right    x 4   SHOULDER SURGERY Bilateral    x 2   VASECTOMY      Family History  Problem Relation Age of Onset   Pulmonary fibrosis Mother    Hypertension Father    Other Father        liver failure   Diabetes Brother    Breast cancer Paternal Aunt    Colon cancer Neg Hx    Esophageal cancer Neg Hx    Stomach cancer Neg Hx    Rectal cancer Neg Hx     Prior to Admission medications   Medication Sig Start Date End Date Taking? Authorizing Provider  calcium  carbonate (TUMS - DOSED IN MG ELEMENTAL CALCIUM ) 500 MG chewable tablet Chew 3 tablets by mouth at bedtime.   Yes [provider]  furosemide  (LASIX ) 40 MG tablet Take 40 mg by mouth daily.   Yes [provider]  lactulose , encephalopathy, (CHRONULAC ) 10 GM/15ML SOLN Take 10 g by mouth daily as needed (constipation).  Yes [provider]  linezolid  (ZYVOX ) 600 MG tablet Take 1 tablet (600 mg total) by mouth 2 (two) times daily for 14 days. 03/01/24 03/16/24 Yes Vu, Constance DASEN, MD  ondansetron  (ZOFRAN -ODT) 4 MG disintegrating tablet Take 1 tablet (4 mg total) by mouth every 8 (eight) hours as needed. 02/20/24  Yes Long, Fonda MATSU, MD  QUEtiapine  (SEROQUEL ) 50 MG tablet Take 0.5-1 tablets (25-50 mg total) by mouth at bedtime. Patient taking differently: Take 50 mg by mouth at bedtime. 01/18/24  Yes Alvia Bring, DO  spironolactone  (ALDACTONE ) 100 MG tablet Take 1 tablet (100 mg total) by mouth daily. 02/15/24  Yes Alvia Bring, DO  vancomycin  (VANCOCIN ) 125 MG capsule Take 1 capsule (125 mg total) by mouth in the morning and at bedtime. 03/01/24 04/20/24 Yes VuConstance DASEN, MD     Current Facility-Administered Medications  Medication Dose Route Frequency Provider Last Rate Last Admin   0.9 %  sodium chloride  infusion (Manually program via Guardrails IV Fluids)   Intravenous Once Howerter, Justin B, DO       0.9 %  sodium chloride  infusion (Manually program via Guardrails IV Fluids)   Intravenous Once Debby Camila LABOR, MD       0.9 %  sodium chloride  infusion (Manually program via Guardrails IV Fluids)   Intravenous Once Debby Camila LABOR, MD       acetaminophen  (TYLENOL ) tablet 650 mg  650 mg Oral Q6H PRN Debby Camila LABOR, MD       Or   acetaminophen  (TYLENOL ) suppository 650 mg  650 mg Rectal Q6H PRN Debby Camila LABOR, MD       albuterol  (PROVENTIL ) (2.5 MG/3ML) 0.083% nebulizer solution 2.5 mg  2.5 mg Nebulization Q2H PRN Debby Camila LABOR, MD       ceFEPIme  (MAXIPIME ) 2 g in sodium chloride  0.9 % 100 mL IVPB  2 g Intravenous Q8H Debby Camila LABOR, MD   Stopped at 03/03/24 0715   lactated ringers  infusion   Intravenous Continuous Vicky Charleston, PA-C 150 mL/hr at 03/03/24 0729 Rate Verify at 03/03/24 0729   metroNIDAZOLE  (FLAGYL ) IVPB 500 mg  500 mg Intravenous Q12H Debby Camila A, MD 100 mL/hr at 03/03/24 0949 500 mg at 03/03/24 0949   pantoprazole  (PROTONIX ) injection 40 mg  40 mg Intravenous Q12H Debby Camila A, MD   40 mg at 03/03/24 9070   vancomycin  (VANCOREADY) IVPB 1500 mg/300 mL  1,500 mg Intravenous Q24H Debby Camila LABOR, MD       Current Outpatient Medications  Medication Sig Dispense Refill   calcium  carbonate (TUMS - DOSED IN MG ELEMENTAL CALCIUM ) 500 MG chewable tablet Chew 3 tablets by mouth at bedtime.     furosemide  (LASIX ) 40 MG tablet Take 40 mg by mouth daily.     lactulose , encephalopathy, (CHRONULAC ) 10 GM/15ML SOLN Take 10 g by mouth daily as needed (constipation).     linezolid  (ZYVOX ) 600 MG tablet Take 1 tablet (600 mg total) by mouth 2 (two) times daily for 14 days. 28 tablet 0   ondansetron  (ZOFRAN -ODT) 4 MG  disintegrating tablet Take 1 tablet (4 mg total) by mouth every 8 (eight) hours as needed. 20 tablet 0   QUEtiapine  (SEROQUEL ) 50 MG tablet Take 0.5-1 tablets (25-50 mg total) by mouth at bedtime. (Patient taking differently: Take 50 mg by mouth at bedtime.) 90 tablet 1   spironolactone  (ALDACTONE ) 100 MG tablet Take 1 tablet (100 mg total) by mouth daily. 90 tablet 1   vancomycin  (VANCOCIN ) 125 MG  capsule Take 1 capsule (125 mg total) by mouth in the morning and at bedtime. 60 capsule 1    Allergies as of 03/03/2024 - Review Complete 03/03/2024  Allergen Reaction Noted   Apple juice Swelling and Other (See Comments) 05/07/2021   Cucumber extract Itching and Nausea And Vomiting 10/16/2013   Depakote er [divalproex sodium er] Swelling and Other (See Comments) 11/10/2017   Depakote [valproic acid] Anaphylaxis 10/16/2013   Peanut butter flavoring agent (non-screening) Anaphylaxis and Swelling 10/16/2013   Peanut oil Swelling 10/16/2013   Peanut-containing drug products Swelling 10/16/2013   Shellfish allergy Itching and Swelling 10/16/2013   Shrimp extract Itching and Swelling 10/16/2013   Apple Swelling 05/04/2022   Cantaloupe extract allergy skin test Rash 12/30/2017   Codeine Itching, Rash, and Other (See Comments) 10/16/2013   Firvanq  [vancomycin ] Rash 12/29/2018   Lactose intolerance (gi) Diarrhea and Other (See Comments)     Social History   Socioeconomic History   Marital status: Married    Spouse name: [name]   Number of children: 2   Years of education: Not on file   Highest education level: 12th grade  Occupational History    Comment: disability  Tobacco Use   Smoking status: Never   Smokeless tobacco: Current    Types: Snuff  Vaping Use   Vaping status: Never Used  Substance and Sexual Activity   Alcohol use: Not Currently   Drug use: Never   Sexual activity: Yes    Partners: Female  Other Topics Concern   Not on file  Social History Narrative   Lives with  wife   Social Drivers of Health   Financial Resource Strain: Low Risk  (10/18/2023)   Overall Financial Resource Strain (CARDIA)    Difficulty of Paying Living Expenses: Not hard at all  Food Insecurity: No Food Insecurity (10/18/2023)   Hunger Vital Sign    Worried About Running Out of Food in the Last Year: Never true    Ran Out of Food in the Last Year: Never true  Transportation Needs: No Transportation Needs (10/18/2023)   PRAPARE - Transportation    Lack of Transportation (Medical): No    Lack of Transportation (Non-Medical): No  Physical Activity: Insufficiently Active (10/18/2023)   Exercise Vital Sign    Days of Exercise per Week: 2 days    Minutes of Exercise per Session: 60 min  Stress: Stress Concern Present (01/09/2023)   [employer] of Occupational Health - Occupational Stress Questionnaire    Feeling of Stress : To some extent  Social Connections: Unknown (10/18/2023)   Social Connection and Isolation Panel    Frequency of Communication with Friends and Family: More than three times a week    Frequency of Social Gatherings with Friends and Family: Once a week    Attends Religious Services: More than 4 times per year    Active Member of [employer] or Organizations: Patient declined    Attends Banker Meetings: Patient declined    Marital Status: Married  Catering manager Violence: Not At Risk (09/11/2023)   Humiliation, Afraid, Rape, and Kick questionnaire    Fear of Current or Ex-Partner: No    Emotionally Abused: No    Physically Abused: No    Sexually Abused: No     Code Status   Code Status: Full Code  Review of Systems: All systems reviewed and negative except where noted in HPI.  Physical Exam: Vital signs in last 24 hours: Temp:  [97.4 F (36.3 C)-101  F (38.3 C)] 97.4 F (36.3 C) (07/12 0931) Pulse Rate:  [64-94] 64 (07/12 0931) Resp:  [0-31] 17 (07/12 0931) BP: (91-117)/(54-87) 97/58 (07/12 0956) SpO2:  [94 %-100 %] 95 % (07/12  0730)    General:  Pleasant male in NAD Psych:  Cooperative. Normal mood and affect Eyes: Pupils equal Ears:  Normal auditory acuity Nose: No deformity, discharge or lesions Neck:  Supple, no masses felt Lungs:  Clear to auscultation.  Heart:  Regular rate, regular rhythm.  Abdomen:  Soft, nondistended, nontender, active bowel sounds, no masses felt Rectal :  Deferred Msk: Symmetrical without gross deformities.  Neurologic:  Alert, oriented, grossly normal neurologically.  No asterixis Extremities : No edema.  Skin:  Pallor.    Intake/Output from previous day: 07/11 0701 - 07/12 0700 In: 1835 [IV Piggyback:1835] Out: -  Intake/Output this shift:  No intake/output data recorded.   Vina Dasen, NP-C   03/03/2024, 10:42 AM  I have taken an interval history, thoroughly reviewed the chart and examined the patient. I agree with the Advanced Practitioner's note, impression and recommendations, and have recorded additional findings, impressions and recommendations below. I performed a substantive portion of this encounter (>50% time spent), including a complete performance of the medical decision making.  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 Federico and has upcoming [facility] hepatology clinic evaluation), here with ongoing fever and bacteremia of unclear source.  We are asked to comment on the status of his liver condition as well as CTAP findings and the potential relation to the fever.  He has worsening recent lower extremity edema, otherwise his cirrhosis appears compensated and stable based on overall clinical presentation including exam imaging and lab work.  I agree with surgical impression that this patient has no abdominal pain, lipase is normal, and his radiographic findings regarding the gallbladder and pancreas are noninfectious edema from portal hypertension and marked hypoalbuminemia.   These do not represent infectious source in my opinion.  He has a harsh systolic murmur on exam and today's echocardiogram shows calcified and moderately stenotic aortic valve.  No clear vegetations were seen, though exam was reportedly somewhat limited for this evaluation.  He did not have gastric or esophageal varices on last EGD in December 2023, but has not had a repeat upper endoscopy since then.  If the patient is to have a TEE, the question often arises whether or not EGD is needed before that in cirrhotic patients with portal hypertension.  Current guidelines without is not routinely necessary unless there was overt evidence of GI bleeding, which is not evident today including soft brown nonbloody stool on my digital rectal exam.  I am happy to have an ongoing dialogue with cardiology about that if they have other questions or concerns.  His acute on chronic anemia therefore does not appear to be GI bleeding but rather related to his recent illnesses and perhaps some degree of phlebotomy.  We will remain available for further consultation during this patient's hospitalization.  Since he does not need cholecystectomy based on our consultants note and there were no immediate plans for endoscopic evaluation, this patient can be given low-sodium diet if no other procedures are planned for him today.  (Medically complex patient, high degree medical decision making)   Victory LITTIE Brand III Office:[phone]

## 2024-03-03 NOTE — ED Notes (Signed)
 Pt transported to MRI

## 2024-03-03 NOTE — Progress Notes (Signed)
 Progress Note   Patient: Juan [name] FMW:[id number] DOB: 1966-06-05 DOA: 03/03/2024     0 DOS: the patient was seen and examined on 03/03/2024   Brief hospital course:  Kaidan Harpster is a 58 y.o. male with medical history significant of  HTN, Cirrhosis with portal hypertension and history of GI bleed, PTSD, bipolar d/o, heart murmur,GERD, who presents to ED with fever and bacteremia.  Patient in early June developed  fever with weakness on vacation in [place] . He  was seen at [facility] ED 6/18. He was treated and given one dose of antibiotic and discharged on cefdinir .  Of note cultures were drawn that on that visit and became positive. He followed up with pcp on 6/22 who kept him on cefdinir  and referred him to ID who he saw on 7/10.  Throughout that time he would intermittently spike fevers. Due continued fevered and continued possible blood + cultures he was switched to zyvox  of which he took one dose. Patient at home still not feeling improved and spiking fevers presented ED for further evaluation. Patient notes no n/v/d/ or abdominal pain. He notes no sob /cough/ dysuria/diarrhea or chest pain . He does note lower back pain.    ED Course:  Vitals:101, bp 106/87, hr 94, rr 25sat 97%   Na 132, K 3.2, cl 108, CO2 14, ast 61/t bili 3.7 Wbc 8.5, hgb 7.6,plt 57 EKG:NSR no hyperacute st-twave changes Ammonia 30  RVP neg  Lactic 4.3,3.1 Cxr NAD UA Neg Tx metronidazole  aztrenonam , LR      Assessment and Plan:   Sepsis due to gram-positive bacteremia As evidenced by fever with a Tmax of 101, tachypnea, lactic acidosis and marked leukocytosis Blood cultures collected on 02/29/24 yield Gram positive cocci in pairs and chains.  Continue empiric antibiotic therapy with vancomycin , cefepime  and Flagyl  Imaging shows diffuse inflammatory fat stranding and fluid about the pancreas, consistent with acute pancreatitis. No pancreatic ductal dilatation or acute pancreatic  fluid collection. Cholelithiasis. Gallbladder wall thickening and trace pericholecystic fluid, concerning for cholecystitis although somewhat nonspecific in the setting of ascites. Overall constellation of findings could be explained by cholecystitis and gallstone pancreatitis. Diffuse wall thickening and mucosal hyperenhancement of the cecum likely related to portal colopathy. Cirrhosis. Small volume perihepatic ascites. 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 Diffuse bilateral bronchial wall thickening and interlobular septal thickening throughout the lung bases. Findings consistent with pulmonary edema. Cardiomegaly and coronary artery disease.   Acute blood loss anemia Most likely related to patient's known history of liver cirrhosis According to the patient stools are brown Noted to have a drop in his hemoglobin from 7.6-6.7 Will transfuse 1 unit of packed RBC Monitor H/H closely Continue IV PPI   Acute pancreatitis Acute cholecystitis CBD stone Patient presented for evaluation of a fever but denies having any abdominal pain, no nausea or vomiting. MRCP shows Pancreatic and peripancreatic edema, compatible with the reported clinical history of pancreatitis.Cholelithiasis with gallbladder wall edema/thickening, compatible with cholecystitis. Gallbladder wall thickening can also be seen in the setting of liver insufficiency/systemic disease. MRCP imaging is markedly motion degraded limiting assessment. While no definite choledocholithiasis on MRCP images, axial T2 imaging raises the question of a 3 mm stone at the ampulla. ERCP could be used to further evaluate. 11 x 11 mm cystic lesion in the body of pancreas. 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 Follow-up MRI abdomen with  and without contrast recommended in 1 year. Cirrhosis with evidence of  portal hypertension including recanalization of the paraumbilical vein and prominent left abdominal varices. Consulted gastroenterology, no need for endoscopy at this time and recommend to start patient on a low-sodium diet Appreciate surgery input, patient is not a candidate for surgical intervention given his cirrhosis and if there is an ongoing concern for cholecystitis he would need a perc chole tube by IR.      Subjective: No new complaints. No abdominal pain, no nausea, no vomiting  Physical Exam: Vitals:   03/03/24 0945 03/03/24 0956 03/03/24 1000 03/03/24 1015  BP: (!) 86/54 (!) 97/58 97/65 98/66   Pulse:    (!) 59  Resp:    16  Temp:    (!) 97.4 F (36.3 C)  TempSrc:    Oral  SpO2: 96%   94%   Eyes: Pupils equal , lids and conjunctivae normal, sclera itceric ENMT: Mucous membranes are moist. .Normal dentition.  Neck: normal, supple, no masses, no thyromegaly Respiratory: clear to auscultation bilaterally, no wheezing, no crackles. Normal respiratory effort. No accessory muscle use.  Cardiovascular: Regular rate and rhythm, + murmurs , no rubs / gallops. No extremity edema. 2+ pedal pulses.  Abdomen: not distended, non tenderness, no masses palpated. No hepatosplenomegaly. Bowel sounds positive.  Musculoskeletal: no clubbing / cyanosis. No joint deformity upper and lower extremities. Good ROM, no contractures. Normal muscle tone.  Skin: no rashes, lesions, ulcers. No induration Neurologic: CN grossly intact. Sensation intact,  Strength 5/5 in all 4.  Psychiatric: Normal judgment and insight. Alert and oriented x 3. Normal mood.    Data Reviewed: Hemoglobin 6.7, bicarb 16 degrees Labs reviewed  Family Communication: Plan of care was discussed with patient in detail. They verbalize understanding and agree with the plan  Disposition: Status is: Inpatient Remains inpatient appropriate because: On IV antibiotics  Planned Discharge Destination: Home    Time spent: 50  minutes  Author: Aimee Somerset, MD 03/03/2024 3:04 PM  For on call review [email or web address].

## 2024-03-03 NOTE — Sepsis Progress Note (Signed)
 Elink monitoring for the code sepsis protocol.

## 2024-03-03 NOTE — Consult Note (Signed)
 Glendia Alas 1966/02/20  [id number].    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 He was recently evaluated in [place]  and determined to have bacteremia and ?endocarditis, which he was told was likely from dentition. He was scheduled to undergo a PICC placement for prolonged antibiotics, however, he had a fever to 102 and was instructed to present to the ED. His workup here included an US , CT, and MRI. Notable findings include evidence of pancreatitis, a pancreatic cyst, and GB wall thickening with periportal edema, as well as sequela of his cirrhosis. WBC 17, Hb 6.7, platelets 56. Tbili 4.3. Lipase WNL.  On exam, patient is resting in bed. He denies abdominal pain, nausea, or emesis. He reports that he has been sober for almost 1 year but did have a relapse recently. He is scheduled to see transplant at [facility] next week.   ROS: Review of Systems  Constitutional:  Positive for chills, fever and malaise/fatigue.  HENT: Negative.    Eyes: Negative.   Respiratory: Negative.    Cardiovascular: Negative.   Gastrointestinal: Negative.   Genitourinary: Negative.   Musculoskeletal: Negative.   Skin: Negative.   Neurological: Negative.   Endo/Heme/Allergies: Negative.   Psychiatric/Behavioral: Negative.      Family History  Problem Relation Age of Onset   Pulmonary fibrosis Mother    Hypertension Father    Other Father        liver failure   Diabetes Brother    Breast cancer Paternal Aunt    Colon cancer Neg Hx    Esophageal cancer Neg Hx    Stomach cancer Neg Hx    Rectal cancer Neg Hx     Past Medical History:  Diagnosis Date   Acute bacterial sinusitis 11/15/2022   Acute pain of right knee 03/10/2023   AKI (acute kidney injury) (HCC) 09/11/2023   Alcohol abuse 01/11/2022    Alcohol addiction (HCC)    Alcohol use disorder 05/23/2023   Alcoholic cirrhosis of liver with ascites (HCC) 02/17/2022   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  Chronic GERD with HH  09/2019--EGD--[facility], [place]--showed no varices; gr D esophagitis;  Rx: PPI daily     Eustachian tube dysfunction 03/02/2023   Febrile illness 06/27/2023   Fracture of laryngeal cartilage (HCC) 01/17/2020   Last Assessment & Plan:   Formatting of this note might be different from the original.  Emergency department follow-up for evaluation of laryngeal trauma.  CT obtained  the night of the injury is reviewed independently and shows nondisplaced fracture of the thyroid  cartilage.  In general, his voice is much improved from the night of the trauma.  Denies any difficulty breathing.  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 07/11/2020   Lower extremity edema 09/06/2022   Lumbar degenerative disc disease 07/26/2022   Malnutrition of moderate degree 01/11/2022   MDD (major depressive disorder), recurrent severe, 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   Nonrheumatic aortic valve stenosis    Normocytic anemia 03/02/2022   Pain of left calf 04/11/2023   Pancytopenia (HCC) 09/18/2021   Portal hypertensive gastropathy (HCC)    Post-traumatic osteoarthritis of right knee 01/04/2023   Prolonged QT interval 05/05/2022   PTSD (post-traumatic stress disorder)    Rib pain on right side 10/31/2022   Right ankle sprain 02/12/2021   Right lower quadrant pain 04/17/2021   Right wrist injury 10/18/2023   Shoulder pain, left, posterior 12/22/2021   Systolic murmur 01/06/2021   Thrombocytopenia (HCC) 11/02/2020   Tibialis posterior tendinitis, right 04/14/2021   Tinea pedis 05/12/2021   Tobacco chew use 03/02/2022   Traumatic hemorrhagic shock (HCC)    Urinary frequency 03/23/2021   Well adult exam 01/06/2021    Past Surgical History:  Procedure Laterality Date   BIOPSY  09/24/2021   Procedure: BIOPSY;  Surgeon: Federico Rosario BROCKS, MD;  Location: [facility] ENDOSCOPY;  Service: Gastroenterology;;   ORIN MEDIATE RELEASE Bilateral    COLONOSCOPY WITH PROPOFOL  N/A 09/24/2021   Procedure: COLONOSCOPY WITH PROPOFOL ;  Surgeon: Federico Rosario BROCKS, MD;  Location: [facility] ENDOSCOPY;  Service: Gastroenterology;  Laterality: N/A;   ESOPHAGOGASTRODUODENOSCOPY (EGD) WITH PROPOFOL  N/A 09/24/2021   Procedure: ESOPHAGOGASTRODUODENOSCOPY (EGD) WITH PROPOFOL ;  Surgeon: Federico Rosario BROCKS, MD;  Location: [facility] ENDOSCOPY;   Service: Gastroenterology;  Laterality: N/A;   ESOPHAGOGASTRODUODENOSCOPY (EGD) WITH PROPOFOL  N/A 08/18/2022   Procedure: ESOPHAGOGASTRODUODENOSCOPY (EGD) WITH PROPOFOL ;  Surgeon: Abran Norleen SAILOR, MD;  Location: [facility] ENDOSCOPY;  Service: Gastroenterology;  Laterality: N/A;   FLEXIBLE SIGMOIDOSCOPY N/A 08/18/2022   Procedure: FLEXIBLE SIGMOIDOSCOPY;  Surgeon: Abran Norleen SAILOR, MD;  Location: [facility] ENDOSCOPY;  Service: Gastroenterology;  Laterality: N/A;   FRACTURE SURGERY     left ankle plate   HERNIA REPAIR     inguinal   KNEE SURGERY Right    x 4   SHOULDER SURGERY Bilateral    x 2   VASECTOMY      Social History:  reports that he has never smoked. His smokeless tobacco use includes snuff. He reports that he does not currently use alcohol. He reports that he does not use drugs.  Allergies:  Allergies  Allergen Reactions   Apple Juice Swelling and Other (See Comments)    Tongue swelling   Cucumber Extract Itching and Nausea And Vomiting    No extracts; just cucumber   Depakote Er [Divalproex Sodium Er] Swelling and Other (See Comments)  Tongue swelling   Depakote [Valproic Acid] Anaphylaxis   Peanut Butter Flavoring Agent (Non-Screening) Anaphylaxis and Swelling   Peanut Oil Swelling   Peanut-Containing Drug Products Swelling   Shellfish Allergy Itching and Swelling   Shrimp Extract Itching and Swelling   Apple Swelling   Cantaloupe Extract Allergy Skin Test Rash   Codeine Itching, Rash and Other (See Comments)    Patient reports he can take CODONES without problems   Firvanq  [Vancomycin ] Rash   Lactose Intolerance (Gi) Diarrhea and Other (See Comments)    Indigestion, Stomach pain, Flatulence    (Not in a hospital admission)   Physical Exam: Blood pressure (!) 97/58, pulse 64, temperature (!) 97.4 F (36.3 C), temperature source Oral, resp. rate 17, SpO2 95%. Gen: male, NAD Abd: soft, mildly distended, non-tender to palpation  Results for orders placed or performed  during the hospital encounter of 03/03/24 (from the past 48 hours)  Comprehensive metabolic panel     Status: Abnormal   Collection Time: 03/03/24  1:01 AM  Result Value Ref Range   Sodium 132 (L) 135 - 145 mmol/L   Potassium 3.2 (L) 3.5 - 5.1 mmol/L   Chloride 108 98 - 111 mmol/L   CO2 14 (L) 22 - 32 mmol/L   Glucose, Bld 101 (H) 70 - 99 mg/dL    Comment: Glucose reference range applies only to samples taken after fasting for at least 8 hours.   BUN 9 6 - 20 mg/dL   Creatinine, Ser 8.80 0.61 - 1.24 mg/dL   Calcium  7.8 (L) 8.9 - 10.3 mg/dL   Total Protein 4.5 (L) 6.5 - 8.1 g/dL   Albumin 1.7 (L) 3.5 - 5.0 g/dL   AST 61 (H) 15 - 41 U/L   ALT 25 0 - 44 U/L   Alkaline Phosphatase 66 38 - 126 U/L   Total Bilirubin 3.7 (H) 0.0 - 1.2 mg/dL   GFR, Estimated >39 >39 mL/min    Comment: (NOTE) Calculated using the CKD-EPI Creatinine Equation (2021)    Anion gap 10 5 - 15    Comment: Performed at [facility], [address], [place], [place] [address]  CBC with Differential     Status: Abnormal   Collection Time: 03/03/24  1:01 AM  Result Value Ref Range   WBC 8.5 4.0 - 10.5 K/uL   RBC 2.83 (L) 4.22 - 5.81 MIL/uL   Hemoglobin 7.6 (L) 13.0 - 17.0 g/dL   HCT 75.7 (L) 60.9 - 47.9 %   MCV 85.5 80.0 - 100.0 fL   MCH 26.9 26.0 - 34.0 pg   MCHC 31.4 30.0 - 36.0 g/dL   RDW 76.5 (H) 88.4 - 84.4 %   Platelets 57 (L) 150 - 400 K/uL    Comment: Immature Platelet Fraction may be clinically indicated, consider ordering this additional test [id number] REPEATED TO VERIFY SPECIMEN CHECKED FOR CLOTS PLATELET COUNT CONFIRMED BY SMEAR    nRBC 0.0 0.0 - 0.2 %   Neutrophils Relative % 84 %   Neutro Abs 7.2 1.7 - 7.7 K/uL   Lymphocytes Relative 5 %   Lymphs Abs 0.4 (L) 0.7 - 4.0 K/uL   Monocytes Relative 8 %   Monocytes Absolute 0.7 0.1 - 1.0 K/uL   Eosinophils Relative 1 %   Eosinophils Absolute 0.1 0.0 - 0.5 K/uL   Basophils Relative 1 %   Basophils Absolute 0.0 0.0 - 0.1 K/uL   WBC  Morphology MORPHOLOGY UNREMARKABLE    RBC Morphology MORPHOLOGY UNREMARKABLE  Smear Review MORPHOLOGY UNREMARKABLE    Immature Granulocytes 1 %   Abs Immature Granulocytes 0.12 (H) 0.00 - 0.07 K/uL    Comment: Performed at [facility], [address], [place], [place] [address]  Protime-INR     Status: Abnormal   Collection Time: 03/03/24  1:01 AM  Result Value Ref Range   Prothrombin Time 26.9 (H) 11.4 - 15.2 seconds   INR 2.4 (H) 0.8 - 1.2    Comment: (NOTE) INR goal varies based on device and disease states. Performed at [facility], [address] [place], [place] [address]   Blood Culture (routine x 2)     Status: None (Preliminary result)   Collection Time: 03/03/24  1:01 AM   Specimen: BLOOD  Result Value Ref Range   Specimen Description BLOOD SITE NOT SPECIFIED    Special Requests      BOTTLES DRAWN AEROBIC AND ANAEROBIC Blood Culture results may not be optimal due to an inadequate volume of blood received in culture bottles   Culture      NO GROWTH < 12 HOURS Performed at [facility], [address], [place], [place] [address]    Report Status PENDING   Blood Culture (routine x 2)     Status: None (Preliminary result)   Collection Time: 03/03/24  1:06 AM   Specimen: BLOOD  Result Value Ref Range   Specimen Description BLOOD SITE NOT SPECIFIED    Special Requests      BOTTLES DRAWN AEROBIC AND ANAEROBIC Blood Culture results may not be optimal due to an inadequate volume of blood received in culture bottles   Culture      NO GROWTH < 12 HOURS Performed at [facility], [address], [place], [place] [address]    Report Status PENDING   Ammonia     Status: None   Collection Time: 03/03/24  1:11 AM  Result Value Ref Range   Ammonia 30 9 - 35 umol/L    Comment: Performed at [facility], [address] [place], [place] [address]  Resp panel by RT-PCR (RSV, Flu A&B, Covid) Anterior Nasal Swab     Status: None   Collection Time:  03/03/24  1:12 AM   Specimen: Anterior Nasal Swab  Result Value Ref Range   SARS Coronavirus 2 by RT PCR NEGATIVE NEGATIVE   Influenza A by PCR NEGATIVE NEGATIVE   Influenza B by PCR NEGATIVE NEGATIVE    Comment: (NOTE) The Xpert Xpress SARS-CoV-2/FLU/RSV plus assay is intended as an aid in the diagnosis of influenza from Nasopharyngeal swab specimens and should not be used as a sole basis for treatment. Nasal washings and aspirates are unacceptable for Xpert Xpress SARS-CoV-2/FLU/RSV testing.  Fact Sheet for Patients: BloggerCourse.com  Fact Sheet for Healthcare Providers: SeriousBroker.it  This test is not yet approved or cleared by the [place]  FDA and has been authorized for detection and/or diagnosis of SARS-CoV-2 by FDA under an Emergency Use Authorization (EUA). This EUA will remain in effect (meaning this test can be used) for the duration of the COVID-19 declaration under Section 564(b)(1) of the Act, 21 U.S.C. section 360bbb-3(b)(1), unless the authorization is terminated or revoked.     Resp Syncytial Virus by PCR NEGATIVE NEGATIVE    Comment: (NOTE) Fact Sheet for Patients: BloggerCourse.com  Fact Sheet for Healthcare Providers: SeriousBroker.it  This test is not yet approved or cleared by the [place]  FDA and has been authorized  for detection and/or diagnosis of SARS-CoV-2 by FDA under an Emergency Use Authorization (EUA). This EUA will remain in effect (meaning this test can be used) for the duration of the COVID-19 declaration under Section 564(b)(1) of the Act, 21 U.S.C. section 360bbb-3(b)(1), unless the authorization is terminated or revoked.  Performed at [facility], [address] [place], [place] [address]   I-Stat Lactic Acid, ED     Status: Abnormal   Collection Time: 03/03/24  1:27 AM  Result Value Ref Range   Lactic Acid,  Venous 4.3 (HH) 0.5 - 1.9 mmol/L   Comment NOTIFIED PHYSICIAN   Urinalysis, w/ Reflex to Culture (Infection Suspected) -Urine, Clean Catch     Status: Abnormal   Collection Time: 03/03/24  3:43 AM  Result Value Ref Range   Specimen Source URINE, CLEAN CATCH    Color, Urine AMBER (A) YELLOW    Comment: BIOCHEMICALS MAY BE AFFECTED BY COLOR   APPearance CLEAR CLEAR   Specific Gravity, Urine 1.013 1.005 - 1.030   pH 6.0 5.0 - 8.0   Glucose, UA NEGATIVE NEGATIVE mg/dL   Hgb urine dipstick LARGE (A) NEGATIVE   Bilirubin Urine NEGATIVE NEGATIVE   Ketones, ur NEGATIVE NEGATIVE mg/dL   Protein, ur NEGATIVE NEGATIVE mg/dL   Nitrite NEGATIVE NEGATIVE   Leukocytes,Ua NEGATIVE NEGATIVE   RBC / HPF >50 0 - 5 RBC/hpf   WBC, UA 6-10 0 - 5 WBC/hpf    Comment:        Reflex urine culture not performed if WBC <=10, OR if Squamous epithelial cells >5. If Squamous epithelial cells >5 suggest recollection.    Bacteria, UA NONE SEEN NONE SEEN   Squamous Epithelial / HPF 0-5 0 - 5 /HPF   Mucus PRESENT    Hyaline Casts, UA PRESENT     Comment: Performed at [facility], [address] [place], [place] [address]  I-Stat Lactic Acid, ED     Status: Abnormal   Collection Time: 03/03/24  3:50 AM  Result Value Ref Range   Lactic Acid, Venous 3.1 (HH) 0.5 - 1.9 mmol/L   Comment NOTIFIED PHYSICIAN   HIV Antibody (routine testing w rflx)     Status: None   Collection Time: 03/03/24  5:05 AM  Result Value Ref Range   HIV Screen 4th Generation wRfx Non Reactive Non Reactive    Comment: Performed at [facility], [address] [place], [place] [address]  Comprehensive metabolic panel     Status: Abnormal   Collection Time: 03/03/24  5:05 AM  Result Value Ref Range   Sodium 131 (L) 135 - 145 mmol/L   Potassium 3.4 (L) 3.5 - 5.1 mmol/L   Chloride 106 98 - 111 mmol/L   CO2 17 (L) 22 - 32 mmol/L   Glucose, Bld 94 70 - 99 mg/dL    Comment: Glucose reference range applies only to samples taken  after fasting for at least 8 hours.   BUN 9 6 - 20 mg/dL   Creatinine, Ser 8.77 0.61 - 1.24 mg/dL   Calcium  8.2 (L) 8.9 - 10.3 mg/dL   Total Protein 4.4 (L) 6.5 - 8.1 g/dL   Albumin 1.6 (L) 3.5 - 5.0 g/dL   AST 61 (H) 15 - 41 U/L   ALT 26 0 - 44 U/L   Alkaline Phosphatase 61 38 - 126 U/L   Total Bilirubin 4.3 (H) 0.0 - 1.2 mg/dL   GFR, Estimated >39 >39 mL/min  Comment: (NOTE) Calculated using the CKD-EPI Creatinine Equation (2021)    Anion gap 8 5 - 15    Comment: Performed at [facility], [address], [place], [place] [address]  CBC     Status: Abnormal   Collection Time: 03/03/24  5:05 AM  Result Value Ref Range   WBC 17.3 (H) 4.0 - 10.5 K/uL   RBC 2.45 (L) 4.22 - 5.81 MIL/uL   Hemoglobin 6.7 (LL) 13.0 - 17.0 g/dL    Comment: REPEATED TO VERIFY THIS CRITICAL RESULT HAS VERIFIED AND BEEN CALLED TO R. TAUL RN BY MARY ALAMANO ON 07 12 2025 AT 0600, AND HAS BEEN READ BACK.     HCT 20.8 (L) 39.0 - 52.0 %   MCV 84.9 80.0 - 100.0 fL   MCH 27.3 26.0 - 34.0 pg   MCHC 32.2 30.0 - 36.0 g/dL   RDW 76.3 (H) 88.4 - 84.4 %   Platelets 56 (L) 150 - 400 K/uL    Comment: Immature Platelet Fraction may be clinically indicated, consider ordering this additional test [id number] REPEATED TO VERIFY    nRBC 0.0 0.0 - 0.2 %    Comment: Performed at [facility], [address] [place], [place] [address]  Type and screen [facility]     Status: None (Preliminary result)   Collection Time: 03/03/24  5:55 AM  Result Value Ref Range   ABO/RH(D) B POS    Antibody Screen NEG    Sample Expiration 03/06/2024,2359    Unit Number [id number]    Blood Component Type RED CELLS,LR    Unit division 00    Status of Unit ALLOCATED    Transfusion Status OK TO TRANSFUSE    Crossmatch Result Compatible    Unit Number [id number]    Blood Component Type RED CELLS,LR    Unit division 00    Status of Unit ISSUED    Transfusion Status OK TO TRANSFUSE    Crossmatch  Result      Compatible Performed at [facility], [address], [place], [place] [address]   Prepare RBC (crossmatch)     Status: None   Collection Time: 03/03/24  5:55 AM  Result Value Ref Range   Order Confirmation      ORDER PROCESSED BY BLOOD BANK Performed at [facility], [address], [place], [place] [address]   Prepare RBC (crossmatch)     Status: None   Collection Time: 03/03/24  6:44 AM  Result Value Ref Range   Order Confirmation      ORDER PROCESSED BY BLOOD BANK Performed at [facility], [address], [place], [place] [address]   Prepare platelet pheresis     Status: None (Preliminary result)   Collection Time: 03/03/24  7:18 AM  Result Value Ref Range   Unit Number [id number]    Blood Component Type PLTP2 PSORALEN TREATED    Unit division 00    Status of Unit ALLOCATED    Transfusion Status OK TO TRANSFUSE    ECHOCARDIOGRAM COMPLETE Result Date: 03/03/2024    ECHOCARDIOGRAM REPORT   Patient Name:   Aboubacar Matsuo Date of Exam: 03/03/2024 Medical Rec #:  [id number]       Height:       65.0 in Accession #:    [id number]      Weight:       178.0 lb Date of Birth:  [birth year]  BSA:          1.883 m Patient Age:    58 years        BP:           85/55 mmHg Patient Gender: M               HR:           67 bpm. Exam Location:  Inpatient Procedure: 2D Echo, Cardiac Doppler and Color Doppler (Both Spectral and Color            Flow Doppler were utilized during procedure). Indications:    Bacteremia  History:        Patient has prior history of Echocardiogram examinations, most                 recent 09/14/2023.  Sonographer:    Philomena Daring Referring Phys: CAMILA DELENA NED IMPRESSIONS  1. Left ventricular ejection fraction, by estimation, is 60 to 65%. The left ventricle has normal function. The left ventricle has no regional wall motion abnormalities. There is mild left ventricular hypertrophy. Left ventricular diastolic parameters were normal.  2.  Right ventricular systolic function is normal. The right ventricular size is normal.  3. The mitral valve is abnormal. Trivial mitral valve regurgitation. No evidence of mitral stenosis.  4. Compared to 09/14/23 no real change mean gradient 18-16 mmHg peak 31.4->29 mmHg Difficult to r/o vegeatations on a severely calcified AV If concern for SBE is high can consider TEE. The aortic valve is tricuspid. There is severe calcifcation of the aortic valve. There is severe thickening of the aortic valve. Aortic valve regurgitation is mild. Moderate aortic valve stenosis.  5. The inferior vena cava is normal in size with greater than 50% respiratory variability, suggesting right atrial pressure of 3 mmHg. FINDINGS  Left Ventricle: Left ventricular ejection fraction, by estimation, is 60 to 65%. The left ventricle has normal function. The left ventricle has no regional wall motion abnormalities. Strain was performed and the global longitudinal strain is indeterminate. The left ventricular internal cavity size was normal in size. There is mild left ventricular hypertrophy. Left ventricular diastolic parameters were normal. Right Ventricle: The right ventricular size is normal. No increase in right ventricular wall thickness. Right ventricular systolic function is normal. Left Atrium: Left atrial size was normal in size. Right Atrium: Right atrial size was normal in size. Pericardium: There is no evidence of pericardial effusion. Mitral Valve: The mitral valve is abnormal. There is mild thickening of the mitral valve leaflet(s). There is mild calcification of the mitral valve leaflet(s). Mild mitral annular calcification. Trivial mitral valve regurgitation. No evidence of mitral valve stenosis. Tricuspid Valve: The tricuspid valve is normal in structure. Tricuspid valve regurgitation is mild . No evidence of tricuspid stenosis. Aortic Valve: Compared to 09/14/23 no real change mean gradient 18-16 mmHg peak 31.4->29 mmHg Difficult  to r/o vegeatations on a severely calcified AV If concern for SBE is high can consider TEE. The aortic valve is tricuspid. There is severe calcifcation  of the aortic valve. There is severe thickening of the aortic valve. Aortic valve regurgitation is mild. Moderate aortic stenosis is present. Aortic valve mean gradient measures 16.0 mmHg. Aortic valve peak gradient measures 29.0 mmHg. Aortic valve area, by VTI measures 1.29 cm. Pulmonic Valve: The pulmonic valve was normal in structure. Pulmonic valve regurgitation is not visualized. No evidence of pulmonic stenosis. Aorta: The aortic root is normal in size and structure. Venous: The inferior vena  cava is normal in size with greater than 50% respiratory variability, suggesting right atrial pressure of 3 mmHg. IAS/Shunts: No atrial level shunt detected by color flow Doppler. Additional Comments: 3D was performed not requiring image post processing on an independent workstation and was indeterminate.  LEFT VENTRICLE PLAX 2D LVIDd:         4.00 cm     Diastology LVIDs:         2.40 cm     LV e' medial:    10.20 cm/s LV PW:         1.00 cm     LV E/e' medial:  11.6 LV IVS:        1.30 cm     LV e' lateral:   12.30 cm/s LVOT diam:     1.80 cm     LV E/e' lateral: 9.6 LV SV:         73 LV SV Index:   39 LVOT Area:     2.54 cm  LV Volumes (MOD) LV vol d, MOD A2C: 67.6 ml LV vol d, MOD A4C: 99.7 ml LV vol s, MOD A2C: 17.5 ml LV vol s, MOD A4C: 27.4 ml LV SV MOD A2C:     50.1 ml LV SV MOD A4C:     99.7 ml LV SV MOD BP:      62.3 ml RIGHT VENTRICLE             IVC RV S prime:     13.50 cm/s  IVC diam: 1.40 cm TAPSE (M-mode): 2.5 cm LEFT ATRIUM             Index        RIGHT ATRIUM           Index LA diam:        3.70 cm 1.97 cm/m   RA Area:     16.70 cm LA Vol (A2C):   48.8 ml 25.92 ml/m  RA Volume:   45.00 ml  23.90 ml/m LA Vol (A4C):   43.4 ml 23.05 ml/m LA Biplane Vol: 46.7 ml 24.81 ml/m  AORTIC VALVE AV Area (Vmax):    1.24 cm AV Area (Vmean):   1.29 cm AV Area  (VTI):     1.29 cm AV Vmax:           269.33 cm/s AV Vmean:          182.000 cm/s AV VTI:            0.567 m AV Peak Grad:      29.0 mmHg AV Mean Grad:      16.0 mmHg LVOT Vmax:         131.50 cm/s LVOT Vmean:        92.250 cm/s LVOT VTI:          0.286 m LVOT/AV VTI ratio: 0.51  AORTA Ao Root diam: 3.20 cm Ao Asc diam:  3.50 cm MITRAL VALVE MV Area (PHT): 3.37 cm     SHUNTS MV Decel Time: 225 msec     Systemic VTI:  0.29 m MV E velocity: 118.00 cm/s  Systemic Diam: 1.80 cm MV A velocity: 72.80 cm/s MV E/A ratio:  1.62 Maude Emmer MD Electronically signed by Maude Emmer MD Signature Date/Time: 03/03/2024/11:20:03 AM    Final    US  Abdomen Limited RUQ (LIVER/GB) Result Date: 03/03/2024 CLINICAL DATA:  6194 Acute cholecystitis 6194 EXAM: ULTRASOUND ABDOMEN LIMITED RIGHT UPPER QUADRANT COMPARISON:  March 03, 2024 FINDINGS: Gallbladder:  Cholelithiasis. There is circumferential gallbladder wall thickening to 4 mm. There is trace pericholecystic fluid. Gallbladder wall appears edematous. No sonographic Beverley sign is reported. Common bile duct: Diameter: Visualized portion measures 2 mm, within normal limits. Majority is not visualized. Liver: No focal lesion identified. Heterogeneous and coarsened parenchymal echogenicity. Nodular liver contour. Portal vein is grossly patent on color Doppler imaging with normal direction of blood flow towards the liver is poorly visualized. Other: Small volume ascites. IMPRESSION: 1. Cholelithiasis with circumferential gallbladder wall thickening and trace pericholecystic fluid. Findings are equivocal for acute cholecystitis in the setting of cirrhosis as well as reported pancreatitis. If further imaging is desired, HIDA scan could differentiate. 2. Cirrhotic liver morphology. 3. Small volume ascites. Electronically Signed   By: Corean Salter M.D.   On: 03/03/2024 09:13   MR ABDOMEN MRCP W WO CONTAST Result Date: 03/03/2024 CLINICAL DATA:  Acute pancreatitis. EXAM: MRI  ABDOMEN WITHOUT AND WITH CONTRAST (INCLUDING MRCP) TECHNIQUE: Multiplanar multisequence MR imaging of the abdomen was performed both before and after the administration of intravenous contrast. Heavily T2-weighted images of the biliary and pancreatic ducts were obtained, and three-dimensional MRCP images were rendered by post processing. CONTRAST:  8mL GADAVIST  GADOBUTROL  1 MMOL/ML IV SOLN COMPARISON:  CT scan earlier same day FINDINGS: Lower chest: Dependent atelectasis. Hepatobiliary: Nodular liver contour compatible cirrhosis. No arterial phase hyperenhancement or focal restricted diffusion within the liver parenchyma. Tiny hepatic cysts are seen in both hepatic lobes. Gallbladder is distended with gallbladder wall edema/thickening. Numerous layering tiny 1-3 mm gallstones evident. MRCP imaging is markedly motion degraded limiting assessment. While no definite choledocholithiasis on MRCP images, axial T2 haste imaging (23/3) raises the question of a 3 mm stone at the ampulla. Common duct measures approximately 7 mm diameter. Common bile duct measures approximately 5 mm diameter. Pancreas: Pancreatic and peripancreatic edema evident, compatible with the reported clinical history of pancreatitis. 11 x 11 mm cystic lesion is identified in the body of pancreas (18/3) Spleen:  No splenomegaly. No suspicious focal mass lesion. Adrenals/Urinary Tract: No adrenal nodule or mass. Tiny T2 hyperintensities without enhancement in both kidneys are too small to characterize but statistically most likely benign cyst. No followup imaging is recommended. Stomach/Bowel: Stomach is decompressed. Duodenum is normally positioned as is the ligament of Treitz. No small bowel or colonic dilatation within the visualized abdomen. Vascular/Lymphatic: No abdominal aortic aneurysm. No abdominal aortic atherosclerotic calcification. Portal vein is patent. Recanalization of the paraumbilical vein evident. Prominent left abdominal varices.  There is no gastrohepatic or hepatoduodenal ligament lymphadenopathy. No retroperitoneal or mesenteric lymphadenopathy. Other: No substantial intraperitoneal free fluid although there is some trace fluid around the liver and in the para colic gutters. Musculoskeletal: No focal suspicious marrow enhancement within the visualized bony anatomy. IMPRESSION: 1. Pancreatic and peripancreatic edema, compatible with the reported clinical history of pancreatitis. 2. Cholelithiasis with gallbladder wall edema/thickening, compatible with cholecystitis. Gallbladder wall thickening can also be seen in the setting of liver insufficiency/systemic disease. MRCP imaging is markedly motion degraded limiting assessment. While no definite choledocholithiasis on MRCP images, axial T2 imaging raises the question of a 3 mm stone at the ampulla. ERCP could be used to further evaluate. 3. 11 x 11 mm cystic lesion in the body of pancreas. 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 Follow-up MRI abdomen with and without contrast recommended in 1 year. This recommendation follows ACR consensus guidelines: Management of Incidental Pancreatic Cysts: A White Paper of the ACR Incidental Findings Committee. J Am  Coll Radiol 2017;14:911-923. 4. Cirrhosis with evidence of portal hypertension including recanalization of the paraumbilical vein and prominent left abdominal varices. Electronically Signed   By: Camellia Candle M.D.   On: 03/03/2024 09:07   MR 3D Recon At Scanner Result Date: 03/03/2024 CLINICAL DATA:  Acute pancreatitis. EXAM: MRI ABDOMEN WITHOUT AND WITH CONTRAST (INCLUDING MRCP) TECHNIQUE: Multiplanar multisequence MR imaging of the abdomen was performed both before and after the administration of intravenous contrast. Heavily T2-weighted images of the biliary and pancreatic ducts were obtained, and three-dimensional MRCP images were rendered by post processing. CONTRAST:  8mL GADAVIST  GADOBUTROL  1 MMOL/ML IV SOLN  COMPARISON:  CT scan earlier same day FINDINGS: Lower chest: Dependent atelectasis. Hepatobiliary: Nodular liver contour compatible cirrhosis. No arterial phase hyperenhancement or focal restricted diffusion within the liver parenchyma. Tiny hepatic cysts are seen in both hepatic lobes. Gallbladder is distended with gallbladder wall edema/thickening. Numerous layering tiny 1-3 mm gallstones evident. MRCP imaging is markedly motion degraded limiting assessment. While no definite choledocholithiasis on MRCP images, axial T2 haste imaging (23/3) raises the question of a 3 mm stone at the ampulla. Common duct measures approximately 7 mm diameter. Common bile duct measures approximately 5 mm diameter. Pancreas: Pancreatic and peripancreatic edema evident, compatible with the reported clinical history of pancreatitis. 11 x 11 mm cystic lesion is identified in the body of pancreas (18/3) Spleen:  No splenomegaly. No suspicious focal mass lesion. Adrenals/Urinary Tract: No adrenal nodule or mass. Tiny T2 hyperintensities without enhancement in both kidneys are too small to characterize but statistically most likely benign cyst. No followup imaging is recommended. Stomach/Bowel: Stomach is decompressed. Duodenum is normally positioned as is the ligament of Treitz. No small bowel or colonic dilatation within the visualized abdomen. Vascular/Lymphatic: No abdominal aortic aneurysm. No abdominal aortic atherosclerotic calcification. Portal vein is patent. Recanalization of the paraumbilical vein evident. Prominent left abdominal varices. There is no gastrohepatic or hepatoduodenal ligament lymphadenopathy. No retroperitoneal or mesenteric lymphadenopathy. Other: No substantial intraperitoneal free fluid although there is some trace fluid around the liver and in the para colic gutters. Musculoskeletal: No focal suspicious marrow enhancement within the visualized bony anatomy. IMPRESSION: 1. Pancreatic and peripancreatic edema,  compatible with the reported clinical history of pancreatitis. 2. Cholelithiasis with gallbladder wall edema/thickening, compatible with cholecystitis. Gallbladder wall thickening can also be seen in the setting of liver insufficiency/systemic disease. MRCP imaging is markedly motion degraded limiting assessment. While no definite choledocholithiasis on MRCP images, axial T2 imaging raises the question of a 3 mm stone at the ampulla. ERCP could be used to further evaluate. 3. 11 x 11 mm cystic lesion in the body of pancreas. 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 Follow-up MRI abdomen with and without contrast recommended in 1 year. This recommendation follows ACR consensus guidelines: Management of Incidental Pancreatic Cysts: A White Paper of the ACR Incidental Findings Committee. J Am Coll Radiol 2017;14:911-923. 4. Cirrhosis with evidence of portal hypertension including recanalization of the paraumbilical vein and prominent left abdominal varices. Electronically Signed   By: Camellia Candle M.D.   On: 03/03/2024 09:07   CT CHEST ABDOMEN PELVIS W CONTRAST Result Date: 03/03/2024 CLINICAL DATA:  Sepsis EXAM: CT CHEST, ABDOMEN, AND PELVIS WITH CONTRAST TECHNIQUE: Multidetector CT imaging of the chest, abdomen and pelvis was performed following the standard protocol during bolus administration of intravenous contrast. RADIATION DOSE REDUCTION: This exam was performed according to the departmental dose-optimization program which includes automated exposure control, adjustment of the mA and/or kV  according to patient size and/or use of iterative reconstruction technique. 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 Cardiomegaly. Left and right coronary artery calcifications. No pericardial effusion. Mediastinum/Nodes: No  enlarged mediastinal, hilar, or axillary lymph nodes. Thyroid  gland, trachea, and esophagus demonstrate no significant findings. Lungs/Pleura: Trace bilateral pleural effusions and associated atelectasis or consolidation. Diffuse bilateral bronchial wall thickening and interlobular septal thickening throughout the lung bases. Musculoskeletal: No chest wall abnormality. No acute osseous findings. CT ABDOMEN PELVIS FINDINGS Hepatobiliary: Coarse, nodular contour of the liver. Small gallstones. Gallbladder wall thickening and trace pericholecystic fluid. No biliary ductal dilatation. Pancreas: Diffuse inflammatory fat stranding and fluid about the pancreas. No pancreatic ductal dilatation or acute pancreatic fluid collection. Spleen: Normal in size without significant abnormality. Adrenals/Urinary Tract: Adrenal glands are unremarkable. Kidneys are normal, without renal calculi, solid lesion, or hydronephrosis. Bladder is unremarkable. Stomach/Bowel: Stomach is within normal limits. Appendix appears normal. Diffuse wall thickening and mucosal hyperenhancement of the cecum (series 7, image 65). Vascular/Lymphatic: Aortic atherosclerosis. Recanalization of the umbilical vein with ventral abdominal and left upper quadrant varices. No enlarged abdominal or pelvic lymph nodes. Reproductive: No mass or other abnormality. Other: No abdominal wall hernia or abnormality. Small volume perihepatic ascites. Musculoskeletal: No acute osseous findings. IMPRESSION: 1. Diffuse inflammatory fat stranding and fluid about the pancreas, consistent with acute pancreatitis. No pancreatic ductal dilatation or acute pancreatic fluid collection. 2. Cholelithiasis. Gallbladder wall thickening and trace pericholecystic fluid, concerning for cholecystitis although somewhat nonspecific in the setting of ascites. Overall constellation of findings could be explained by cholecystitis and gallstone pancreatitis. 3. Diffuse wall thickening and mucosal  hyperenhancement of the cecum likely related to portal colopathy. 4. Cirrhosis. Small volume perihepatic ascites. Recanalization of the umbilical vein with ventral abdominal and left upper quadrant varices. 5. Trace bilateral pleural effusions and associated atelectasis or consolidation. Diffuse bilateral bronchial wall thickening and interlobular septal thickening throughout the lung bases. Findings consistent with pulmonary edema. 6. Cardiomegaly and coronary artery disease. Aortic Atherosclerosis (ICD10-I70.0). Electronically Signed   By: Marolyn JONETTA Jaksch M.D.   On: 03/03/2024 06:10   DG Chest Port 1 View Result Date: 03/03/2024 CLINICAL DATA:  Possible sepsis EXAM: PORTABLE CHEST 1 VIEW COMPARISON:  02/20/2024 FINDINGS: Cardiac shadow is stable. Lungs are well aerated bilaterally. Mediastinal markings are somewhat accentuated secondary to patient motion artifact. No bony abnormality is seen. IMPRESSION: No active disease. Electronically Signed   By: Oneil Devonshire M.D.   On: 03/03/2024 01:31    Assessment/Plan 58 y/o M w/ evidence of decompensated cirrhosis who presents with fevers and known bacteremia w/ ?cholecystitis on imaging  - His exam is not consistent with cholecystitis given the lack of pain/nausea and his imaging findings are nonspecific in the setting of his liver disease. There are no signs of complication related to ?pancreatitis - He is not a candidate for surgical intervention given his cirrhosis. - Would recommend workup and management of other potential sources of his bacteremia. If there is ongoing concern for cholecystitis then he would need a perc chole tube by IR. Ultimately would defer ongoing management of his GB/liver to a transplant center - Surgery will sign off. Please reach out with any questions/concerns.   Cordella DELENA Polly Marlis Cheron Surgery 03/03/2024, 11:30 AM Please see Amion for pager number during day hours 7:00am-4:30pm or 7:00am -11:30am on weekends

## 2024-03-03 NOTE — ED Notes (Signed)
 PT HAS A CRITICAL LAB HGB-6.7. AGBATA MD NOTIFIED @0601 

## 2024-03-03 NOTE — ED Notes (Signed)
 Nurse will collect labs.  KM

## 2024-03-03 NOTE — H&P (Addendum)
 History and Physical    Juan [name] FMW:[id number] DOB: 1966/01/11 DOA: 03/03/2024  PCP: Alvia Bring, DO  Patient coming from: home  I have personally briefly reviewed patient's old medical records in [facility]  Chief Complaint: fever/bacteremia  HPI: Juan [name] is a 58 y.o. male with medical history significant of  HTN, Cirrhosis , PTSD, bipolar d/o, heart murmur,GERD, hx of GI bleed/portal gastropathy, who presents to ED with fever and bacteremia.  Patient in early June developed  fever with weakness on vacation in [place] . He  was seen at [facility] center ED 6/18. He was treated and given one dose of antibiotic and discharged on cefdinir .  Of note cultures were drawn that on that visit and became positive. He followed up with pcp on 6/22 who kept him on cefdinir  and referred him to ID who he saw on 7/10.  Throughout that time he would intermittently spike fevers. Due continued fevered and continued possible blood + cultures he was switched to zyvox  of which he took one dose. Patient at home still not feeling improved and spiking fevers presented ED for further evaluation. Patient notes no n/v/d/ or abdominal pain. He notes no sob /cough/ dysuria/diarrhea or chest pain . He does note lower back pain.   ED Course:  Vitals:101, bp 106/87, hr 94, rr 25sat 97%  Na 132, K 3.2, cl 108, CO2 14, ast 61/t bili 3.7 Wbc 8.5, hgb 7.6,plt 57 EKG:NSR no hyperacute st-twave changes Ammonia 30  RVP neg  Lactic 4.3,3.1 Cxr NAD UA Neg Tx metronidazole  aztrenonam , LR  Review of Systems: As per HPI otherwise 10 point review of systems negative.   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  Chronic GERD with HH  09/2019--EGD--[facility], [place]--showed no varices; gr D esophagitis;  Rx: PPI daily     Eustachian tube dysfunction 03/02/2023   Febrile illness 06/27/2023   Fracture of laryngeal cartilage (HCC) 01/17/2020   Last Assessment & Plan:   Formatting of this note might be different from the original.  Emergency department follow-up for evaluation of laryngeal trauma.  CT obtained the night of the injury is reviewed independently and shows nondisplaced fracture of the thyroid  cartilage.  In general, his voice is much improved from the night of the trauma.  Denies any difficulty breathing.  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 07/11/2020   Lower extremity edema 09/06/2022   Lumbar degenerative disc disease 07/26/2022   Malnutrition of moderate degree 01/11/2022   MDD (major depressive disorder), recurrent severe, 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   Nonrheumatic aortic valve stenosis    Normocytic anemia 03/02/2022   Pain of left calf 04/11/2023   Pancytopenia (HCC) 09/18/2021   Portal hypertensive gastropathy (HCC)    Post-traumatic osteoarthritis of right knee 01/04/2023   Prolonged QT interval 05/05/2022   PTSD (post-traumatic stress disorder)    Rib pain on right side 10/31/2022   Right ankle sprain 02/12/2021   Right lower quadrant pain 04/17/2021   Right wrist injury 10/18/2023   Shoulder pain, left, posterior 12/22/2021   Systolic murmur 01/06/2021   Thrombocytopenia (HCC) 11/02/2020   Tibialis posterior tendinitis, right 04/14/2021   Tinea pedis 05/12/2021   Tobacco chew use 03/02/2022   Traumatic hemorrhagic shock (HCC)    Urinary frequency 03/23/2021   Well adult exam 01/06/2021    Past Surgical History:  Procedure Laterality Date   BIOPSY  09/24/2021   Procedure: BIOPSY;  Surgeon: Federico Rosario BROCKS, MD;  Location: [facility] ENDOSCOPY;  Service: Gastroenterology;;   ORIN MEDIATE RELEASE Bilateral    COLONOSCOPY WITH PROPOFOL  N/A 09/24/2021   Procedure: COLONOSCOPY WITH PROPOFOL ;  Surgeon: Federico Rosario BROCKS, MD;  Location: [facility] ENDOSCOPY;  Service: Gastroenterology;  Laterality: N/A;   ESOPHAGOGASTRODUODENOSCOPY (EGD) WITH PROPOFOL  N/A 09/24/2021   Procedure: ESOPHAGOGASTRODUODENOSCOPY (EGD) WITH PROPOFOL ;  Surgeon: Federico Rosario BROCKS, MD;  Location: [facility] ENDOSCOPY;  Service: Gastroenterology;  Laterality: N/A;   ESOPHAGOGASTRODUODENOSCOPY (EGD) WITH PROPOFOL  N/A 08/18/2022   Procedure:  ESOPHAGOGASTRODUODENOSCOPY (EGD) WITH PROPOFOL ;  Surgeon: Abran Norleen SAILOR, MD;  Location: [facility] ENDOSCOPY;  Service: Gastroenterology;  Laterality: N/A;   FLEXIBLE SIGMOIDOSCOPY N/A 08/18/2022   Procedure: FLEXIBLE SIGMOIDOSCOPY;  Surgeon: Abran Norleen SAILOR, MD;  Location: [facility] ENDOSCOPY;  Service: Gastroenterology;  Laterality: N/A;   FRACTURE SURGERY     left ankle plate   HERNIA REPAIR     inguinal   KNEE SURGERY Right    x 4   SHOULDER SURGERY Bilateral    x 2   VASECTOMY       reports that he has never smoked. His smokeless tobacco use includes snuff. He reports that he does not currently use alcohol. He reports that he does not use drugs.  Allergies  Allergen Reactions   Apple Juice Swelling and Other (See Comments)    Tongue swelling   Cucumber Extract Itching and Nausea And Vomiting    No extracts; just cucumber   Depakote Er [Divalproex Sodium Er] Swelling and Other (See Comments)    Tongue swelling   Depakote [Valproic Acid] Anaphylaxis   Peanut Butter Flavoring Agent (Non-Screening) Anaphylaxis and Swelling   Peanut Oil Swelling   Peanut-Containing Drug Products Swelling   Shellfish Allergy Itching and Swelling  Shrimp Extract Itching and Swelling   Apple Swelling   Cantaloupe Extract Allergy Skin Test Rash   Codeine Itching, Rash and Other (See Comments)    Patient reports he can take CODONES without problems   Firvanq  [Vancomycin ] Rash   Lactose Intolerance (Gi) Diarrhea and Other (See Comments)    Indigestion, Stomach pain, Flatulence    Family History  Problem Relation Age of Onset   Pulmonary fibrosis Mother    Hypertension Father    Other Father        liver failure   Diabetes Brother    Breast cancer Paternal Aunt    Colon cancer Neg Hx    Esophageal cancer Neg Hx    Stomach cancer Neg Hx    Rectal cancer Neg Hx     Prior to Admission medications   Medication Sig Start Date End Date Taking? Authorizing Provider  diazepam  (VALIUM ) 10 MG tablet Take 1  tablet (10 mg total) by mouth as directed. Take 1 tablet 2 hours before dental appointment. Bring remainder of prescription with you to appointment. 11/30/23     diclofenac  Sodium (VOLTAREN ) 1 % GEL Apply 4 grams topically 4 (four) times daily to affected joint. Patient not taking: Reported on 03/01/2024 02/08/23   Curtis Debby PARAS, MD  furosemide  (LASIX ) 40 MG tablet Take 40 mg by mouth daily.    [provider]  lactulose , encephalopathy, (CHRONULAC ) 10 GM/15ML SOLN Take 10 g by mouth daily as needed (constipation).    [provider]  linezolid  (ZYVOX ) 600 MG tablet Take 1 tablet (600 mg total) by mouth 2 (two) times daily for 14 days. 03/01/24 03/16/24  Vu, Constance DASEN, MD  magnesium  oxide (MAG-OX) 400 MG tablet Take 1 tablet (400 mg total) by mouth 2 (two) times daily. Patient not taking: Reported on 03/01/2024 05/13/23   Alvia Bring, DO  ondansetron  (ZOFRAN -ODT) 4 MG disintegrating tablet Take 1 tablet (4 mg total) by mouth every 8 (eight) hours as needed. 02/20/24   Long, Joshua G, MD  potassium chloride  SA (KLOR-CON  M) 20 MEQ tablet Take 2 tablets (40 mEq total) by mouth daily for 5 days. Patient not taking: Reported on 03/01/2024 02/20/24 03/03/24  Darra Fonda MATSU, MD  QUEtiapine  (SEROQUEL ) 50 MG tablet Take 0.5-1 tablets (25-50 mg total) by mouth at bedtime. 01/18/24   Alvia Bring, DO  spironolactone  (ALDACTONE ) 100 MG tablet Take 1 tablet (100 mg total) by mouth daily. Patient not taking: Reported on 03/01/2024 02/15/24   Alvia Bring, DO  traZODone  (DESYREL ) 50 MG tablet Take 0.5 tablets by mouth at bedtime. Patient not taking: Reported on 03/01/2024 10/12/23   [provider]  vancomycin  (VANCOCIN ) 125 MG capsule Take 1 capsule (125 mg total) by mouth in the morning and at bedtime. 03/01/24 04/20/24  Vu, Constance DASEN, MD  vortioxetine  HBr (TRINTELLIX ) 10 MG TABS tablet Take 1 tablet (10 mg total) by mouth daily. Patient not taking: Reported on 03/01/2024 09/27/23   Alvia Bring, DO    Physical Exam: Vitals:   03/03/24 0300 03/03/24 0315 03/03/24 0330 03/03/24 0345  BP: 100/62 101/63 (!) 102/59 96/61  Pulse: 86 81 81 81  Resp: (!) 27 (!) 26 (!) 25 (!) 28  Temp:      SpO2: 97% 96% 96% 98%    Constitutional: NAD, calm, comfortable Vitals:   03/03/24 0300 03/03/24 0315 03/03/24 0330 03/03/24 0345  BP: 100/62 101/63 (!) 102/59 96/61  Pulse: 86 81 81 81  Resp: (!) 27 (!) 26 (!)  25 (!) 28  Temp:      SpO2: 97% 96% 96% 98%   Eyes: Pupils equal , lids and conjunctivae normal, sclera itceric ENMT: Mucous membranes are moist. .Normal dentition.  Neck: normal, supple, no masses, no thyromegaly Respiratory: clear to auscultation bilaterally, no wheezing, no crackles. Normal respiratory effort. No accessory muscle use.  Cardiovascular: Regular rate and rhythm, + murmurs , no rubs / gallops. No extremity edema. 2+ pedal pulses.  Abdomen: no tenderness, no masses palpated. No hepatosplenomegaly. Bowel sounds positive.  Musculoskeletal: no clubbing / cyanosis. No joint deformity upper and lower extremities. Good ROM, no contractures. Normal muscle tone.  Skin: no rashes, lesions, ulcers. No induration Neurologic: CN grossly intact. Sensation intact,  Strength 5/5 in all 4.  Psychiatric: Normal judgment and insight. Alert and oriented x 3. Normal mood.    Labs on Admission: I have personally reviewed following labs and imaging studies  CBC: Recent Labs  Lab 02/27/24 1457 02/29/24 1544 03/03/24 0101  WBC 13.4* 10.1 8.5  NEUTROABS  --  7.9* 7.2  HGB 8.6* 8.0* 7.6*  HCT 26.6* 24.7* 24.2*  MCV 83.1 82 85.5  PLT 70* 66* 57*   Basic Metabolic Panel: Recent Labs  Lab 02/27/24 1457 02/28/24 0300 02/29/24 1544 03/03/24 0101  NA 130* 132* 132* 132*  K 3.2* 3.5 3.7 3.2*  CL 98 104 101 108  CO2 12* 19* 17* 14*  GLUCOSE 120* 115* 131* 101*  BUN 14 16 16 9   CREATININE 1.21 1.09 1.42* 1.19  CALCIUM  9.6 9.4 8.7 7.8*  MG  --   --  1.2*  --     GFR: Estimated Creatinine Clearance: 67.8 mL/min (by C-G formula based on SCr of 1.19 mg/dL). Liver Function Tests: Recent Labs  Lab 02/27/24 1457 02/29/24 1544 03/03/24 0101  AST 59* 72* 61*  ALT 21 29 25   ALKPHOS 93 112 66  BILITOT 5.6* 4.4* 3.7*  PROT 6.3* 5.5* 4.5*  ALBUMIN 3.1* 2.7* 1.7*   Recent Labs  Lab 02/27/24 1457  LIPASE 11   Recent Labs  Lab 02/28/24 0300 02/29/24 1544 03/03/24 0111  AMMONIA 30 57 30   Coagulation Profile: Recent Labs  Lab 02/29/24 1544 03/03/24 0101  INR 2.0* 2.4*   Cardiac Enzymes: No results for input(s): CKTOTAL, CKMB, CKMBINDEX, TROPONINI in the last 168 hours. BNP (last 3 results) Recent Labs    02/20/24 2159  PROBNP 229.0   HbA1C: No results for input(s): HGBA1C in the last 72 hours. CBG: No results for input(s): GLUCAP in the last 168 hours. Lipid Profile: No results for input(s): CHOL, HDL, LDLCALC, TRIG, CHOLHDL, LDLDIRECT in the last 72 hours. Thyroid  Function Tests: No results for input(s): TSH, T4TOTAL, FREET4, T3FREE, THYROIDAB in the last 72 hours. Anemia Panel: No results for input(s): VITAMINB12, FOLATE, FERRITIN, TIBC, IRON, RETICCTPCT in the last 72 hours. Urine analysis:    Component Value Date/Time   COLORURINE YELLOW 02/27/2024 1457   APPEARANCEUR CLEAR 02/27/2024 1457   LABSPEC 1.020 02/27/2024 1457   PHURINE 6.0 02/27/2024 1457   GLUCOSEU NEGATIVE 02/27/2024 1457   HGBUR NEGATIVE 02/27/2024 1457   BILIRUBINUR SMALL (A) 02/27/2024 1457   BILIRUBINUR small (A) 04/17/2021 1646   KETONESUR NEGATIVE 02/27/2024 1457   PROTEINUR NEGATIVE 02/27/2024 1457   UROBILINOGEN 4.0 (A) 04/17/2021 1646   NITRITE NEGATIVE 02/27/2024 1457   LEUKOCYTESUR NEGATIVE 02/27/2024 1457    Radiological Exams on Admission: DG Chest Port 1 View Result Date: 03/03/2024 CLINICAL DATA:  Possible sepsis EXAM: PORTABLE  CHEST 1 VIEW COMPARISON:  02/20/2024 FINDINGS: Cardiac  shadow is stable. Lungs are well aerated bilaterally. Mediastinal markings are somewhat accentuated secondary to patient motion artifact. No bony abnormality is seen. IMPRESSION: No active disease. Electronically Signed   By: Oneil Devonshire M.D.   On: 03/03/2024 01:31    EKG: Independently reviewed.   Assessment/Plan    Sepsis due to  Gram + Bacteremia source unknown  - admit to med tele -continue on broad spectrum abx ( vanc/zosyn ) - follow repeat cultures , will need documentation of clearance -echo pending in am ,if negative will need TEE ordered -f/u culture sensitivities  - ID consult  -s/p ivfs in ED  -CT chest/pelvis abdomen pending   Heart murmur  -echo pending   Hypokalemia -replete prn   ETOH liver disease with cirrhosis  - continue on lasix  and spironolactone   -no symptoms of sbp noted -of note ETOH d/o in remission   Mild hyponatremia  -insetting of cirrhosis -at baseline low 130's  Possible Acute pancreatitis  - however patient w/o any symptoms  - pending further imaging and lipase  - ? Due to ascites f/u on testing  -gi consulted   Possible acute cholecystitis  - currently on cefepime /metronidazole   - RUQ US  pending  -surgery consulted ,?over read  due to ascites as pt w/o symptoms  HTN -relative hypotension hold anti-hypertensive medication   Thrombocytopenia -related to etoh liver disease  - continue to monitor  -plt 50 , no sign of bleeding, transfuse if plt drop to 20   PTSD  Bipolar d/o -resume home regimen    GERD -ppi   hx of GI bleed/portal gastropathy IDA -hgb stable transfuse if < 7  -will check iron levels   DVT prophylaxis: scd Code Status:full/ as discussed per patient wishes in event of cardiac arrest  Family Communication: Nichols,[name] (Spouse) [phone] (Mobile)  Disposition Plan: patient  expected to be admitted greater than 2 midnights  Consults called: ID Admission status: progressive   Camila DELENA Ned  MD [facility]   If 7PM-7AM, please contact night-coverage [email or web address] Password [email or web address]  03/03/2024, 4:45 AM

## 2024-03-03 NOTE — ED Notes (Signed)
 Pt returned from MRI

## 2024-03-03 NOTE — ED Provider Notes (Signed)
 [facility]EMERGENCY DEPT [facility] Emergency Department Provider Note MRN:  [id number]  Arrival date & time: 03/03/24     Chief Complaint   Fever   History of Present Illness   Juan [name] is a 58 y.o. year-old male presents to the ED with chief complaint of fever and chills.  He states that he was seen recently by his PCP for a hospital follow-up appointment and had blood cultures drawn.  The blood cultures  resulted positive for gram-positive cocci in pairs.  He states that today he slid out of bed and was having difficulty getting back up.  Family member assisted him up and noted him to be very hot, and called EMS due to the fever.  Patient noted to be febrile to 101 with EMS and slightly tachycardic.  Recently treated for sepsis in [place]  with cefdinir .  Patient has history of alcoholic cirrhosis.  He takes Lasix .  History provided by patient.   Review of Systems  Pertinent positive and negative review of systems noted in HPI.    Physical Exam   Vitals:   03/03/24 0215 03/03/24 0230  BP: 117/63 115/66  Pulse: 84 86  Resp: (!) 21 (!) 21  Temp:    SpO2: 100% 97%    CONSTITUTIONAL:  chronically ill-appearing, NAD NEURO:  Alert and oriented x 3, CN 3-12 grossly intact EYES:  eyes equal and reactive ENT/NECK:  Supple, no stridor  CARDIO:  tachycardic, regular rhythm, appears well-perfused  PULM:  No respiratory distress, CTAB GI/GU:  non-distended, non tender MSK/SPINE:  No gross deformities, no edema, moves all extremities  SKIN:  no rash, mild jaundice, atraumatic   *Additional and/or pertinent findings included in MDM below  Diagnostic and Interventional Summary    EKG Interpretation Date/Time:    Ventricular Rate:    PR Interval:    QRS Duration:    QT Interval:    QTC Calculation:   R Axis:      Text Interpretation:         Labs Reviewed  COMPREHENSIVE METABOLIC PANEL WITH GFR - Abnormal; Notable for the following components:      Result  Value   Sodium 132 (*)    Potassium 3.2 (*)    CO2 14 (*)    Glucose, Bld 101 (*)    Calcium  7.8 (*)    Total Protein 4.5 (*)    Albumin 1.7 (*)    AST 61 (*)    Total Bilirubin 3.7 (*)    All other components within normal limits  CBC WITH DIFFERENTIAL/PLATELET - Abnormal; Notable for the following components:   RBC 2.83 (*)    Hemoglobin 7.6 (*)    HCT 24.2 (*)    RDW 23.4 (*)    Platelets 57 (*)    Lymphs Abs 0.4 (*)    Abs Immature Granulocytes 0.12 (*)    All other components within normal limits  PROTIME-INR - Abnormal; Notable for the following components:   Prothrombin Time 26.9 (*)    INR 2.4 (*)    All other components within normal limits  I-STAT CG4 LACTIC ACID, ED - Abnormal; Notable for the following components:   Lactic Acid, Venous 4.3 (*)    All other components within normal limits  RESP PANEL BY RT-PCR (RSV, FLU A&B, COVID)  [id number]  CULTURE, BLOOD (ROUTINE X 2)  CULTURE, BLOOD (ROUTINE X 2)  AMMONIA  URINALYSIS, W/ REFLEX TO CULTURE (INFECTION SUSPECTED)  I-STAT CG4 LACTIC ACID, ED    DG  Chest Port 1 View  Final Result    CT CHEST ABDOMEN PELVIS W CONTRAST    (Results Pending)    Medications  lactated ringers  infusion ( Intravenous New Bag/Given 03/03/24 0149)  vancomycin  (VANCOREADY) IVPB 1500 mg/300 mL (1,500 mg Intravenous New Bag/Given 03/03/24 0244)  lactated ringers  bolus 1,000 mL (0 mLs Intravenous Stopped 03/03/24 0244)  aztreonam  (AZACTAM ) 2 g in sodium chloride  0.9 % 100 mL IVPB (0 g Intravenous Stopped 03/03/24 0149)  metroNIDAZOLE  (FLAGYL ) IVPB 500 mg (0 mg Intravenous Stopped 03/03/24 0244)  lactated ringers  bolus 500 mL (0 mLs Intravenous Stopped 03/03/24 0244)     Procedures  /  Critical Care .Critical Care  Performed by: Vicky Charleston, PA-C Authorized by: Vicky Charleston, PA-C   Critical care provider statement:    Critical care time (minutes):  51   Critical care was necessary to treat or prevent imminent or life-threatening  deterioration of the following conditions:  Sepsis   Critical care was time spent personally by me on the following activities:  Development of treatment plan with patient or surrogate, discussions with consultants, evaluation of patient's response to treatment, examination of patient, ordering and review of laboratory studies, ordering and review of radiographic studies, ordering and performing treatments and interventions, pulse oximetry, re-evaluation of patient's condition and review of old charts   ED Course and Medical Decision Making  I have reviewed the triage vital signs, the nursing notes, and pertinent available records from the EMR.  Social Determinants Affecting Complexity of Care: Patient has no clinically significant social determinants affecting this chief complaint..   ED Course: Clinical Course as of 03/03/24 0346  Sat Mar 03, 2024  0134 Lactic 4.3, will give additional 500mL of LR to complete 30cc/Kg fluid bolus [RB]    Clinical Course User Index [RB] Vicky Charleston, PA-C    Medical Decision Making Patient here with fever.  Had a recent positive blood cultures at PCP office.  He has been on 1 day worth of Zyvox .  Has history of liver cirrhosis.  Recent admission for urosepsis in [place] .  Had been taking cefdinir .  Patient febrile and appears unwell today.  Code sepsis activated.  Patient given 1 L LR by EMS, I will give an additional liter here while we wait for first lactic acid.  Patient will need admission.  Will start broad-spectrum antibiotics.  Amount and/or Complexity of Data Reviewed Labs: ordered. Radiology: ordered.  Risk Prescription drug management. Decision regarding hospitalization.         Consultants: I consulted with Hospitalist, Dr. Debby, who is appreciated for admitting.   Treatment and Plan: Patient's exam and diagnostic results are concerning for sepsis and positive blood cutlures.  Feel that patient will need admission to the  hospital for further treatment and evaluation.    Final Clinical Impressions(s) / ED Diagnoses     ICD-10-CM   1. Bacteremia  R78.81       ED Discharge Orders     None         Discharge Instructions Discussed with and Provided to Patient:   Discharge Instructions   None      Vicky Charleston, PA-C 03/03/24 0346    Trine Raynell Moder, MD 03/03/24 [phone]

## 2024-03-04 ENCOUNTER — Inpatient Hospital Stay (HOSPITAL_COMMUNITY)

## 2024-03-04 DIAGNOSIS — R7881 Bacteremia: Secondary | ICD-10-CM | POA: Diagnosis not present

## 2024-03-04 LAB — CBC
HCT: 25.4 % — ABNORMAL LOW (ref 39.0–52.0)
Hemoglobin: 8.1 g/dL — ABNORMAL LOW (ref 13.0–17.0)
MCH: 27.2 pg (ref 26.0–34.0)
MCHC: 31.9 g/dL (ref 30.0–36.0)
MCV: 85.2 fL (ref 80.0–100.0)
Platelets: 59 K/uL — ABNORMAL LOW (ref 150–400)
RBC: 2.98 MIL/uL — ABNORMAL LOW (ref 4.22–5.81)
RDW: 21.5 % — ABNORMAL HIGH (ref 11.5–15.5)
WBC: 12.6 K/uL — ABNORMAL HIGH (ref 4.0–10.5)
nRBC: 0 % (ref 0.0–0.2)

## 2024-03-04 MED ORDER — SODIUM CHLORIDE 0.9 % IV SOLN
2.0000 g | INTRAVENOUS | Status: DC
  Administered 2024-03-04 – 2024-03-09 (×6): 2 g via INTRAVENOUS
  Filled 2024-03-04 (×6): qty 20

## 2024-03-04 MED ORDER — FERROUS SULFATE 325 (65 FE) MG PO TABS
325.0000 mg | ORAL_TABLET | Freq: Every day | ORAL | Status: DC
  Administered 2024-03-05 – 2024-03-09 (×3): 325 mg via ORAL
  Filled 2024-03-04 (×3): qty 1

## 2024-03-04 MED ORDER — VANCOMYCIN HCL 125 MG PO CAPS
125.0000 mg | ORAL_CAPSULE | Freq: Two times a day (BID) | ORAL | Status: DC
  Administered 2024-03-04 – 2024-03-09 (×10): 125 mg via ORAL
  Filled 2024-03-04 (×13): qty 1

## 2024-03-04 MED ORDER — METRONIDAZOLE 500 MG/100ML IV SOLN
500.0000 mg | Freq: Two times a day (BID) | INTRAVENOUS | Status: DC
  Administered 2024-03-04 – 2024-03-08 (×8): 500 mg via INTRAVENOUS
  Filled 2024-03-04 (×8): qty 100

## 2024-03-04 MED ORDER — MELATONIN 5 MG PO TABS
5.0000 mg | ORAL_TABLET | Freq: Once | ORAL | Status: AC
  Administered 2024-03-07: 5 mg via ORAL
  Filled 2024-03-04: qty 1

## 2024-03-04 MED ORDER — FUROSEMIDE 40 MG PO TABS: 40.0000 mg | ORAL_TABLET | Freq: Every day | ORAL | Status: DC

## 2024-03-04 NOTE — Progress Notes (Signed)
 Daily Progress Note  DOA: 03/03/2024 Hospital Day: 2   Cc: Cirrhosis, anemia, fever, multiple abnormalities on abdominal imaging  Brief History:  58 year old male with Etoh alcoholic cirrhosis complicated by portal hypertension followed by Dr. Federico.  Admitted with fever, leukocytosis, acute on chronic anemia.  Multiple GI related findings on imaging.  See 03/03/2024 GI consult     ASSESSMENT    58 yo male with Etoh cirrhosis admitted with persistent fevers.  Despite multiple GI findings on imaging, a GI source of infection is not suspected Undergoing outpatient workup / treatment of fever with concern for endocarditis. Abdominal imaging this admission with multiple findings  including acute pancreatitis, cholelithiasis with concern for acute cholecystitis and ? choledocholithiasis, hyperenhancement of the cecum ( possible colopathy).  Despite all these findings, patient has no GI symptoms , he is non-tender on exam, lipase is normal.  Only a small amount of small volume perihepatic ascites appreciated on imaging.  Surgery evaluated and agrees that cholecystitis is unlikely and not agood candidate for cholecystectomy anyway given cirrhosis . PERC tube tube would be needed if there were ongoing concerns  Etoh related cirrhosis with portal hypertension . No evidence for decompensation ( at least above his baseline). INR is 2.4 but has ranged between 2 to 2.4 for the last several months. He has normal mentation . MELD 3.0: 27 . No esophageal varices on last EGD in Feb 2023.   Acute on chronic anemia, likely combination of acute illness and ? occult GI bleeding from known portal gastropathy.  Presenting hemoglobin 7.6 (not far off from baseline) but did decline a few hours later to 6.7/  No overt GI bleeding.  >>TODAY: Hemoglobin improved to 8.1 post 1 unit of blood   Pancreatic lesion on MRI  11 x 11 mm cystic lesion in the body of pancreas. 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     Principal Problem:   Bacteremia Active Problems:   Anemia, chronic disease   Abnormal finding on GI tract imaging   PLAN   --Okay to proceed with a TEE ( if planned) from a GI standpoint --Low-sodium diet.  --After discharge patient will need to follow-up in our office for ongoing cirrhosis management    Subjective   Feels fine today.  Still no abdominal pain.  No nausea nor vomiting.  Objective     Recent Labs    03/03/24 0101 03/03/24 0505 03/04/24 0903  WBC 8.5 17.3* 12.6*  HGB 7.6* 6.7* 8.1*  HCT 24.2* 20.8* 25.4*  MCV 85.5 84.9 85.2  PLT 57* 56* 59*   Recent Labs    03/03/24 2142  FOLATE 19.7  VITAMINB12 3,360*  FERRITIN 41  TIBC 238*  IRONPCTSAT 10*   Recent Labs    03/03/24 0101 03/03/24 0505 03/03/24 1148  NA 132* 131* 134*  K 3.2* 3.4* 3.5  CL 108 106 106  CO2 14* 17* 16*  GLUCOSE 101* 94 95  BUN 9 9 10   CREATININE 1.19 1.22 1.16  CALCIUM  7.8* 8.2* 8.1*   Recent Labs    03/03/24 0101 03/03/24 0505  PROT 4.5* 4.4*  ALBUMIN 1.7* 1.6*  AST 61* 61*  ALT 25 26  ALKPHOS 66 61  BILITOT 3.7* 4.3*      Imaging:  DG Orthopantogram EXAM: ORTHOPANTOMOGRAM XRAY, 1 VIEW 03/04/2024 08:03:00 AM  TECHNIQUE: Panorex view of the mandible.  COMPARISON: MR head without contrast 02/27/2024.  CLINICAL HISTORY: 01364 Infection [id number]. Infection  FINDINGS:  DENTAL: Multiple dental fillings are in place. Root canal is present in the left second maxillary incisor. No focal dental caries or periapical lucency is present.  BONES: No acute or healing fracture is present.  TMJ: No dislocation.  SOFT TISSUES: The soft tissues are unremarkable.  IMPRESSION: 1. No acute osseous abnormality. 2. No evidence of infection.  Electronically signed by: Lonni Necessary MD 03/04/2024 11:51 AM EDT RP Workstation: [id number]     Scheduled inpatient medications:   sodium chloride    Intravenous Once   sodium chloride     Intravenous Once   [START ON 03/05/2024] ferrous sulfate   325 mg Oral Q breakfast   [START ON 03/05/2024] furosemide   40 mg Oral Daily   melatonin  5 mg Oral Once   pantoprazole  (PROTONIX ) IV  40 mg Intravenous Q12H   vancomycin   125 mg Oral Q12H   Continuous inpatient infusions:   cefTRIAXone  (ROCEPHIN )  IV 2 g (03/04/24 1002)   metronidazole  500 mg (03/04/24 0955)   PRN inpatient medications: acetaminophen  **OR** acetaminophen , albuterol   Vital signs in last 24 hours: Temp:  [97.8 F (36.6 C)-98.4 F (36.9 C)] 97.8 F (36.6 C) (07/13 1015) Pulse Rate:  [62-72] 72 (07/13 1015) Resp:  [16-20] 17 (07/13 1015) BP: (94-111)/(55-66) 98/66 (07/13 1015) SpO2:  [96 %-99 %] 99 % (07/13 1015) Last BM Date : 03/03/24  Intake/Output Summary (Last 24 hours) at 03/04/2024 1515 Last data filed at 03/04/2024 1330 Gross per 24 hour  Intake 1416 ml  Output 2575 ml  Net -1159 ml    Intake/Output from previous day: 07/12 0701 - 07/13 0700 In: 590 [P.O.:590] Out: 1000 [Urine:1000] Intake/Output this shift: Total I/O In: 826 [P.O.:826] Out: 1575 [Urine:1575]   Physical Exam:  General: Alert male in NAD Heart:  Regular rate and rhythm.  Pulmonary: Normal respiratory effort Abdomen: Soft, nondistended, nontender. Normal bowel sounds. Extremities: No lower extremity edema  Neurologic: Alert and oriented Psych: Pleasant. Cooperative     LOS: 1 day   Vina Dasen ,NP 03/04/2024, 3:15 PM

## 2024-03-04 NOTE — Anesthesia Preprocedure Evaluation (Signed)
 Anesthesia Evaluation  Patient identified by MRN, date of birth, ID band Patient awake    Reviewed: Allergy & Precautions, H&P , NPO status , Patient's Chart, lab work & pertinent test results  History of Anesthesia Complications Negative for: history of anesthetic complications  Airway Mallampati: I  TM Distance: >3 FB Neck ROM: Full    Dental no notable dental hx. (+) Teeth Intact, Dental Advisory Given, Caps   Pulmonary neg pulmonary ROS   Pulmonary exam normal breath sounds clear to auscultation       Cardiovascular hypertension, negative cardio ROS Normal cardiovascular exam Rhythm:Regular Rate:Normal  TTE 03/03/24: EF 60-65%, mild LVH, Compared to 09/14/23 no real change mean gradient 18-16 mmHg peak 31.4->29 mmHg Difficult to r/o vegeatations on a severely calcified AV If concern for SBE is high can consider TEE, mild AR, moderate AS     Neuro/Psych   Anxiety Depression Bipolar Disorder   negative neurological ROS  negative psych ROS   GI/Hepatic negative GI ROS, Neg liver ROS, hiatal hernia,GERD  ,,(+) Cirrhosis   ascites  substance abuse  alcohol use  Endo/Other  negative endocrine ROS    Renal/GU negative Renal ROS  negative genitourinary   Musculoskeletal negative musculoskeletal ROS (+) Arthritis ,    Abdominal   Peds negative pediatric ROS (+)  Hematology negative hematology ROS (+) Blood dyscrasia (Hgb 8.1, Plts 59k, INR 2.4), anemia   Anesthesia Other Findings Bacteremia  Reproductive/Obstetrics negative OB ROS                              Anesthesia Physical Anesthesia Plan  ASA: 3  Anesthesia Plan: MAC   Post-op Pain Management: Minimal or no pain anticipated   Induction:   PONV Risk Score and Plan: 1 and Treatment may vary due to age or medical condition and Propofol  infusion  Airway Management Planned: Natural Airway and Nasal Cannula  Additional Equipment:  None  Intra-op Plan:   Post-operative Plan:   Informed Consent:   Plan Discussed with: Anesthesiologist  Anesthesia Plan Comments:          Anesthesia Quick Evaluation

## 2024-03-04 NOTE — Progress Notes (Signed)
 Notified by student nurse, Rosina Ned, that pt is allergic to po vancomycin . Per pt he gets red man syndrome. Contacted [facility], [facility] about pt's allergy to vancomycin  po. Advised by [facility] to hold po until consult Dr. Loney Stank.

## 2024-03-04 NOTE — Consult Note (Signed)
 [facility]    Date of Admission:  03/03/2024   Total days of inpatient antibiotics 1        Reason for Consult: BActeremia    Principal Problem:   Bacteremia Active Problems:   Anemia, chronic disease   Abnormal finding on GI tract imaging   Assessment: 58 year old male with history of alcohol abuse with cirrhosis, strep parapsilosis bacteremia followed by infectious disease, but due to positive blood cultures on 7/9.  #Bacteremia suspect strep parasanguinis(PCN sens) #History of C. Difficile #Possible gallstone pancreatitis vs cholecysttiis - Patient has a hospitalization at outside facility on 6/18 with blood cultures growing strep para sanguinous he did not stay overnight.  His he was given a dose of IV antibiotics and cefdinir .  He was seen by us  his PCP blood culture 6/30 no growth.  He was having subjective fevers.  Seen in ID clinic 7/10 plan was to do 6 weeks of IV antibiotics for empiric endocarditis coverage.  Blood cultures from 7/9 growing GPC's.  Patient was called back. - Patient notes that prior to last hospitalization on 6/18 he had a dental procedure - History of C. difficile x 3 in the past 2 years. -CT AP showed concern for  acute pancreatitis, possible gallstone pancreatitis vs cholecystitis. MRCP showed possible cholecystitis. Gneral surgery consulted, no plans for OR Recommendations:  -Can switch to ceftriaxone  and metronidazole  tomorrow. -PO vaco 125 mg po bid while on abx -TTE, will need TEE. Relayed to primary -Follow blood Cx -Orthopantogram -Standard precaution  Evaluation of this patient requires complex antimicrobial therapy evaluation and counseling + isolation needs for disease transmission risk assessment and mitigation    Microbiology:   Antibiotics: Vancomycin , cefepime  metronidazole   Cultures: Blood 7/9 gram-positive cocci 7/12 Urine  Other   HPI: Juan [name] is a 58 y.o. male with past  medical history of EtOH cirrhosis, C. difficile x 3 in the past last 2 years, followed by infectious disease referred for history of bacteremia last seen on 7/10.  Called to the ED due to positive blood cultures done by PCP on 7/9.  Patient returned to [place]  on a road trip on 01/2024 although for 1 to 2 days.  Admitted to outside facility with blood cultures on 6/18 with blood cultures growing strep para sanguinous penicillin susceptible did not stay overnight.  He was given a dose of IV antibiotics and sent out on cefdinir .  Repeat blood culture 6/30 no growth.  He reports history of dental procedure.  Plan was to transition to IV penicillin x 6 weeks for empiric endocarditis coverage.  He was called back in the interim.   Review of Systems: Review of Systems  All other systems reviewed and are negative.   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  Chronic GERD with HH  09/2019--EGD--[facility], [facility]--showed no varices; gr D esophagitis;  Rx: PPI daily     Eustachian tube dysfunction 03/02/2023   Febrile illness 06/27/2023   Fracture of laryngeal cartilage (HCC) 01/17/2020   Last Assessment & Plan:   Formatting of this note might be different from the original.  Emergency department follow-up for evaluation of laryngeal trauma.  CT obtained the night of the injury is reviewed independently and shows nondisplaced fracture of the thyroid  cartilage.  In general, his voice is much improved from the night of the trauma.  Denies any difficulty breathing.  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 07/11/2020   Lower extremity edema 09/06/2022   Lumbar degenerative disc disease 07/26/2022   Malnutrition of moderate degree 01/11/2022   MDD (major depressive disorder), recurrent severe, 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   Nonrheumatic aortic valve stenosis    Normocytic anemia 03/02/2022   Pain of left calf 04/11/2023   Pancytopenia (HCC) 09/18/2021   Portal hypertensive gastropathy (HCC)    Post-traumatic osteoarthritis of right knee 01/04/2023   Prolonged QT interval 05/05/2022   PTSD (post-traumatic stress disorder)    Rib pain on right side  10/31/2022   Right ankle sprain 02/12/2021   Right lower quadrant pain 04/17/2021   Right wrist injury 10/18/2023   Shoulder pain, left, posterior 12/22/2021   Systolic murmur 01/06/2021   Thrombocytopenia (HCC) 11/02/2020   Tibialis posterior tendinitis, right 04/14/2021   Tinea pedis 05/12/2021   Tobacco chew use 03/02/2022   Traumatic hemorrhagic shock (HCC)    Urinary frequency 03/23/2021   Well adult exam 01/06/2021    Social History   Tobacco Use   Smoking status: Never   Smokeless tobacco: Current    Types: Snuff  Vaping Use   Vaping status: Never Used  Substance Use Topics   Alcohol use: Not Currently   Drug use: Never    Family History  Problem Relation Age of Onset   Pulmonary fibrosis Mother    Hypertension Father    Other Father        liver failure   Diabetes Brother    Breast cancer Paternal Aunt    Colon cancer Neg Hx    Esophageal cancer Neg Hx    Stomach cancer Neg Hx    Rectal cancer Neg Hx    Scheduled Meds:  sodium chloride   Intravenous Once   sodium chloride    Intravenous Once   furosemide   40 mg Intravenous Daily   pantoprazole  (PROTONIX ) IV  40 mg Intravenous Q12H   Continuous Infusions:  ceFEPime  (MAXIPIME ) IV 2 g (03/03/24 2119)   metronidazole  500 mg (03/03/24 2123)   vancomycin  1,500 mg (03/03/24 2247)   PRN Meds:.acetaminophen  **OR** acetaminophen , albuterol  Allergies  Allergen Reactions   Apple Juice Swelling and Other (See Comments)    Tongue swelling   Cucumber Extract Itching and Nausea And Vomiting    No extracts; just cucumber   Depakote Er [Divalproex Sodium Er] Swelling and Other (See Comments)    Tongue swelling   Depakote [Valproic Acid] Anaphylaxis   Peanut Butter Flavoring Agent (Non-Screening) Anaphylaxis and Swelling   Peanut Oil Swelling   Peanut-Containing Drug Products Swelling   Shellfish Allergy Itching and Swelling   Shrimp Extract Itching and Swelling   Apple Swelling   Cantaloupe Extract Allergy  Skin Test Rash   Codeine Itching, Rash and Other (See Comments)    Patient reports he can take CODONES without problems   Firvanq  [Vancomycin ] Rash   Lactose Intolerance (Gi) Diarrhea and Other (See Comments)    Indigestion, Stomach pain, Flatulence    OBJECTIVE: Blood pressure 94/60, pulse 67, temperature 98 F (36.7 C), temperature source Oral, resp. rate 20, SpO2 97%.  Physical Exam Constitutional:      General: He is not in acute distress.    Appearance: He is normal weight. He is not toxic-appearing.  HENT:     Head: Normocephalic and atraumatic.     Right Ear: External ear normal.     Left Ear: External ear normal.     Nose: No congestion or rhinorrhea.     Mouth/Throat:     Mouth: Mucous membranes are moist.     Pharynx: Oropharynx is clear.  Eyes:     Extraocular Movements: Extraocular movements intact.     Conjunctiva/sclera: Conjunctivae normal.     Pupils: Pupils are equal, round, and reactive to light.  Cardiovascular:     Rate and Rhythm: Normal rate and regular rhythm.     Heart sounds: No murmur heard.    No friction rub. No gallop.  Pulmonary:     Effort: Pulmonary effort is normal.     Breath sounds: Normal breath sounds.  Abdominal:     General: Abdomen is flat. Bowel sounds are normal.  Musculoskeletal:        General: No swelling. Normal range of motion.     Cervical back: Normal range of motion and neck supple.  Skin:    General: Skin is warm and dry.  Neurological:     General: No focal deficit present.     Mental Status: He is oriented to person, place, and time.  Psychiatric:        Mood and Affect: Mood normal.     Lab Results Lab Results  Component Value Date   WBC 17.3 (H) 03/03/2024   HGB 6.7 (LL) 03/03/2024   HCT 20.8 (L) 03/03/2024   MCV 84.9 03/03/2024   PLT 56 (L) 03/03/2024    Lab Results  Component Value Date   CREATININE 1.16 03/03/2024   BUN 10 03/03/2024   NA 134 (L) 03/03/2024   K 3.5 03/03/2024   CL 106  03/03/2024   CO2 16 (L) 03/03/2024    Lab Results  Component Value Date   ALT 26 03/03/2024   AST 61 (H) 03/03/2024   ALKPHOS 61 03/03/2024  BILITOT 4.3 (H) 03/03/2024       Juan Stank, MD [facility] 03/04/2024, 1:15 AM

## 2024-03-04 NOTE — Progress Notes (Signed)
 [facility]  Date of Admission:  03/03/2024   Total days of inpatient antibiotics 2  Principal Problem:   Bacteremia Active Problems:   Anemia, chronic disease   Abnormal finding on GI tract imaging          Assessment: 58 year old male with history of alcohol abuse with cirrhosis, strep parapsilosis bacteremia followed by infectious disease, but due to positive blood cultures on 7/9.  #Bacteremia suspect strep parasanguinis(PCN sens) #History of C. Difficile #Possible gallstone pancreatitis vs cholecysttiis - Patient has a hospitalization at outside facility on 6/18 with blood cultures growing strep para sanguinous he did not stay overnight.  His he was given a dose of IV antibiotics and cefdinir .  He was seen by us  his PCP blood culture 6/30 no growth.  He was having subjective fevers.  Seen in ID clinic 7/10 plan was to do 6 weeks of IV antibiotics for empiric endocarditis coverage.  Blood cultures from 7/9 growing GPC's.  Patient was called back. - Patient notes that prior to last hospitalization on 6/18 he had a dental procedure - History of C. difficile x 3 in the past 2 years. -CT AP showed concern for  acute pancreatitis, possible gallstone pancreatitis vs cholecystitis. MRCP showed possible cholecystitis. Gneral surgery consulted, no plans for OR -orthopantogram no infection -TTE calcified AV, difficult to rule our vegetation Recommendations:  -Continue ceftriaxone  and metronidazole  -PO vaco 125 mg po bid while on abx. PT reprots he thinks he had a rash to po  vanco. He is tolerated iV vanc. Willing to try po vanc -TEE -Follow blood Cx -Standard precaution  Evaluation of this patient requires complex antimicrobial therapy evaluation and counseling + isolation needs for disease transmission risk assessment and mitigation    Microbiology:   Antibiotics: Ceftriaxone  and metro Vanco po Cultures: Blood 7/9 strep  parasanguinis 7/12 Urine  Other   SUBJECTIVE: Restin in bed Interval: Afebrile overngiht. Wbc 12/6 k  Review of Systems: Review of Systems  All other systems reviewed and are negative.    Scheduled Meds:  sodium chloride    Intravenous Once   sodium chloride    Intravenous Once   [START ON 03/05/2024] ferrous sulfate   325 mg Oral Q breakfast   [START ON 03/05/2024] furosemide   40 mg Oral Daily   melatonin  5 mg Oral Once   pantoprazole  (PROTONIX ) IV  40 mg Intravenous Q12H   vancomycin   125 mg Oral Q12H   Continuous Infusions:  cefTRIAXone  (ROCEPHIN )  IV 2 g (03/04/24 1002)   metronidazole  500 mg (03/04/24 2220)   PRN Meds:.acetaminophen  **OR** acetaminophen , albuterol  Allergies  Allergen Reactions   Apple Juice Swelling and Other (See Comments)    Tongue swelling   Cucumber Extract Itching and Nausea And Vomiting    No extracts; just cucumber   Depakote Er [Divalproex Sodium Er] Swelling and Other (See Comments)    Tongue swelling   Depakote [Valproic Acid] Anaphylaxis   Peanut Butter Flavoring Agent (Non-Screening) Anaphylaxis and Swelling   Peanut Oil Swelling   Peanut-Containing Drug Products Swelling   Shellfish Allergy Itching and Swelling   Shrimp Extract Itching and Swelling   Apple Swelling   Cantaloupe Extract Allergy Skin Test Rash   Codeine Itching, Rash and Other (See Comments)    Patient reports he can take CODONES without problems   Firvanq  [Vancomycin ] Rash   Lactose Intolerance (Gi) Diarrhea and Other (See Comments)    Indigestion, Stomach pain, Flatulence  OBJECTIVE: Vitals:   03/04/24 0613 03/04/24 1015 03/04/24 1602 03/04/24 2043  BP: (!) 111/55 98/66 103/69 104/68  Pulse: 71 72 70 73  Resp: 16 17 17 16   Temp: 98 F (36.7 C) 97.8 F (36.6 C) 97.6 F (36.4 C) 98.9 F (37.2 C)  TempSrc:  Oral Oral Oral  SpO2: 96% 99% 100% 98%   There is no height or weight on file to calculate BMI.  Physical Exam Constitutional:      General:  He is not in acute distress.    Appearance: He is normal weight. He is not toxic-appearing.  HENT:     Head: Normocephalic and atraumatic.     Right Ear: External ear normal.     Left Ear: External ear normal.     Nose: No congestion or rhinorrhea.     Mouth/Throat:     Mouth: Mucous membranes are moist.     Pharynx: Oropharynx is clear.  Eyes:     Extraocular Movements: Extraocular movements intact.     Conjunctiva/sclera: Conjunctivae normal.     Pupils: Pupils are equal, round, and reactive to light.  Cardiovascular:     Rate and Rhythm: Normal rate and regular rhythm.     Heart sounds: No murmur heard.    No friction rub. No gallop.  Pulmonary:     Effort: Pulmonary effort is normal.     Breath sounds: Normal breath sounds.  Abdominal:     General: Abdomen is flat. Bowel sounds are normal.     Palpations: Abdomen is soft.  Musculoskeletal:        General: No swelling. Normal range of motion.     Cervical back: Normal range of motion and neck supple.  Skin:    General: Skin is warm and dry.  Neurological:     General: No focal deficit present.     Mental Status: He is oriented to person, place, and time.  Psychiatric:        Mood and Affect: Mood normal.       Lab Results Lab Results  Component Value Date   WBC 12.6 (H) 03/04/2024   HGB 8.1 (L) 03/04/2024   HCT 25.4 (L) 03/04/2024   MCV 85.2 03/04/2024   PLT 59 (L) 03/04/2024    Lab Results  Component Value Date   CREATININE 1.16 03/03/2024   BUN 10 03/03/2024   NA 134 (L) 03/03/2024   K 3.5 03/03/2024   CL 106 03/03/2024   CO2 16 (L) 03/03/2024    Lab Results  Component Value Date   ALT 26 03/03/2024   AST 61 (H) 03/03/2024   ALKPHOS 61 03/03/2024   BILITOT 4.3 (H) 03/03/2024        Loney Stank, MD [facility] 03/04/2024, 10:44 PM

## 2024-03-04 NOTE — Progress Notes (Signed)
 Per Dr. Loney Stank, ok to try PO vanc please continue. D/C IV vanc Vanc PO administered 1854. Will report off to night RN to monitor pt for any s/s of allergic reactions. Pt and spouse at bedside also will continue to monitor and notify RN of any s/s of allergic reactions.

## 2024-03-04 NOTE — Progress Notes (Signed)
 PROGRESS NOTE  Jayesh Marbach  FMW:[id number] DOB: [birth year] DOA: 03/03/2024 PCP: Alvia Bring, DO   Brief Narrative: Patient is a 58 year old male with past medical history significant for hypertension, cirrhosis with portal hypertension, alcohol abuse, C. difficile GI bleed, PTSD, bipolar disorder, GERD who presented to the emergency department with complaint of fever.  Developed fever in early June on vacation to [place] .  Patient was first seen at [facility] ED on 6/18 .Blood culture sent there came out to be positive for Strepto  para sanguinous and he followed up with his PCP on 6/22, started on cefdinir  and referred him to ID.  Patient was seen in ID clinic on 7/10 and the plan was to do 6 weeks course of IV antibiotics for empiric endocarditis coverage.  Blood cultures sent on 7/9 showed gram-positive cocci and he was called for admission.  Recent history of dental procedure.  History of C. difficile in past 2 years.  He was spiking fevers at home.  On presentation, blood pressure was soft, he was febrile.    Lactate was elevated in the range of 4.  Patient has been admitted for the management of gram-positive bacteremia.  ID following.  CT abdomen showed acute gallstone pancreatitis versus cholecystitis.  MRCP was also suggestive of  cholecystitis .  General surgery, GI consulted.  Hospital course also remarkable for anemia requiring blood transfusion.  Assessment & Plan:  Principal Problem:   Bacteremia Active Problems:   Anemia, chronic disease   Abnormal finding on GI tract imaging   Sepsis secondary to gram-positive bacteremia: Presented with fever, lactic acidosis.  Initial WBC count was normal but spiked to 17.3 later.  Blood cultures collected on 7/9 showed gram-positive cocci in pairs/chains.  ID consulted and following. Currently on ceftriaxone , metronidazole .  Also on oral vancomycin  while on antibiotic.  Echo showed EF of 60 to 65%, normal right ventricular  function.  Echo cundnt rule out atrial valve vegetation, severe thickening of aortic valve, moderate aortic valve stenosis, mild thickening of the mitral valve leaflet.  Plan for TEE, I have notified cardiology.. Plan is also to obtain orthopantogram.  Follow-up repeat blood cultures: No growth till date.  Acute gallstone pancreatitis/possible cholecystitis: CT  Imaging showed diffuse inflammatory fat stranding in fluid around pancreas consistent with acute pancreatitis.  Also showed cholelithiasis, gallbladder thickening and trace pericholecystic fluid concerning for cholecystitis, cirrhosis, hyperenhancement of the cecum.  MRCP also suggested cholecystitis.. No abdominal pain, nausea or vomiting.  No history of recent alcohol use.  Exam not consistent with cholecystitis due to lack of pain or nausea.  As per general surgery, he is not a candidate for surgical intervention given his cirrhosis.  General surgery recommended percutaneous cholecystostomy tube by IR if there is ongoing concern for cholecystitis.  Imaging findings could be secondary to portal hypertension and hypoalbuminemia. they recommended management of his gallbladder/liver problem to the transplant center.  General surgery signed off.  MRCP also showed pancreatic lesion of 11X 11 mm cystic lesion in the body of pancreas  History of liver cirrhosis/portal hypertension/alcohol abuse: Following with transplant clinic at [facility].  History of decompensated cirrhosis.  GI following here.  Currently not taking alcohol.  Has lower extremity edema  Acute on chronic normocytic anemia/iron deficiency/thrombocytopenia: This is likely from cirrhosis/portal gastropathy..  Hemoglobin of 6 on presentation.  Given a unit of blood transfusion.  Monitor hemoglobin.  Stable in the range of 8 today.  No evidence of acute blood loss.  GI was consulted,  no plan for further workup due to lack of evidence of GI bleed.  Started on iron supplementation        DVT  prophylaxis:SCDs Start: 03/03/24 0416     Code Status: Full Code  Family Communication: None at bedside  Patient status:Inpatient  Patient is from :home  Anticipated discharge un:ynfz  Estimated DC date: After full workup   Consultants: ID, GI, general surgery  Procedures: None  Antimicrobials:  Anti-infectives (From admission, onward)    Start     Dose/Rate Route Frequency Ordered Stop   03/04/24 1000  metroNIDAZOLE  (FLAGYL ) IVPB 500 mg        500 mg 100 mL/hr over 60 Minutes Intravenous Every 12 hours 03/04/24 0202     03/04/24 0800  vancomycin  (VANCOCIN ) capsule 125 mg        125 mg Oral Every 12 hours 03/04/24 0148     03/04/24 0800  cefTRIAXone  (ROCEPHIN ) 2 g in sodium chloride  0.9 % 100 mL IVPB        2 g 200 mL/hr over 30 Minutes Intravenous Every 24 hours 03/04/24 0202     03/03/24 2200  vancomycin  (VANCOREADY) IVPB 1500 mg/300 mL  Status:  Discontinued        1,500 mg 150 mL/hr over 120 Minutes Intravenous Every 24 hours 03/03/24 0509 03/04/24 0746   03/03/24 1000  metroNIDAZOLE  (FLAGYL ) IVPB 500 mg  Status:  Discontinued        500 mg 100 mL/hr over 60 Minutes Intravenous Every 12 hours 03/03/24 0722 03/04/24 0148   03/03/24 0600  aztreonam  (AZACTAM ) 2 g in sodium chloride  0.9 % 100 mL IVPB  Status:  Discontinued        2 g 200 mL/hr over 30 Minutes Intravenous Every 8 hours 03/03/24 0445 03/03/24 0508   03/03/24 0600  ceFEPIme  (MAXIPIME ) 2 g in sodium chloride  0.9 % 100 mL IVPB  Status:  Discontinued        2 g 200 mL/hr over 30 Minutes Intravenous Every 8 hours 03/03/24 0509 03/04/24 0148   03/03/24 0115  aztreonam  (AZACTAM ) 2 g in sodium chloride  0.9 % 100 mL IVPB        2 g 200 mL/hr over 30 Minutes Intravenous  Once 03/03/24 0101 03/03/24 0149   03/03/24 0115  metroNIDAZOLE  (FLAGYL ) IVPB 500 mg        500 mg 100 mL/hr over 60 Minutes Intravenous  Once 03/03/24 0101 03/03/24 0244   03/03/24 0115  vancomycin  (VANCOCIN ) IVPB 1000 mg/200 mL premix  Status:   Discontinued        1,000 mg 200 mL/hr over 60 Minutes Intravenous  Once 03/03/24 0101 03/03/24 0104   03/03/24 0115  vancomycin  (VANCOREADY) IVPB 1500 mg/300 mL        1,500 mg 75 mL/hr over 240 Minutes Intravenous  Once 03/03/24 0110 03/03/24 0432       Subjective: Patient seen and examined at bedside today.  Very comfortable.  Sitting on the edge of the bed.  Afebrile this morning.  Denies any abdomen, nausea or vomiting.  Objective: Vitals:   03/03/24 1506 03/03/24 2125 03/04/24 0017 03/04/24 0613  BP: 106/68 94/60 94/60  (!) 111/55  Pulse: 63 62 67 71  Resp: 17 16 20 16   Temp: (!) 97.5 F (36.4 C) 98.4 F (36.9 C) 98 F (36.7 C) 98 F (36.7 C)  TempSrc: Oral Oral Oral   SpO2: 100% 98% 97% 96%    Intake/Output Summary (Last 24 hours) at 03/04/2024 0755  Last data filed at 03/04/2024 0500 Gross per 24 hour  Intake 590 ml  Output 1000 ml  Net -410 ml   There were no vitals filed for this visit.  Examination:  General exam: Overall comfortable, not in distress HEENT: PERRL, icterus Respiratory system:  no wheezes or crackles  Cardiovascular system: S1 & S2 heard, RRR.  Gastrointestinal system: Abdomen is nondistended, soft and nontender. Central nervous system: Alert and oriented Extremities: No edema, no clubbing ,no cyanosis Skin: No rashes, ,no icterus, icterus   Data Reviewed: I have personally reviewed following labs and imaging studies  CBC: Recent Labs  Lab 02/27/24 1457 02/29/24 1544 03/03/24 0101 03/03/24 0505  WBC 13.4* 10.1 8.5 17.3*  NEUTROABS  --  7.9* 7.2  --   HGB 8.6* 8.0* 7.6* 6.7*  HCT 26.6* 24.7* 24.2* 20.8*  MCV 83.1 82 85.5 84.9  PLT 70* 66* 57* 56*   Basic Metabolic Panel: Recent Labs  Lab 02/28/24 0300 02/29/24 1544 03/03/24 0101 03/03/24 0505 03/03/24 1148  NA 132* 132* 132* 131* 134*  K 3.5 3.7 3.2* 3.4* 3.5  CL 104 101 108 106 106  CO2 19* 17* 14* 17* 16*  GLUCOSE 115* 131* 101* 94 95  BUN 16 16 9 9 10   CREATININE  1.09 1.42* 1.19 1.22 1.16  CALCIUM  9.4 8.7 7.8* 8.2* 8.1*  MG  --  1.2*  --   --   --      Recent Results (from the past 240 hours)  Blood culture (routine single)     Status: Abnormal   Collection Time: 02/29/24  3:50 PM   Specimen: Blood   VB  Result Value Ref Range Status   BLOOD CULTURE, ROUTINE Final report (A)  Final   Organism ID, Bacteria Comment (A)  Final    Comment: Streptococcus parasanguinis Recovered from aerobic and anaerobic bottles.    Antimicrobial Susceptibility Comment  Final    Comment:       ** S = Susceptible; I = Intermediate; R = Resistant **                    P = Positive; N = Negative             MICS are expressed in micrograms per mL    Antibiotic                 RSLT#1    RSLT#2    RSLT#3    RSLT#4 Cefepime                        S Cefotaxime                     S Ceftriaxone                     S Chloramphenicol                S Clindamycin                    S Erythromycin                    R Levofloxacin                   S Penicillin                     I Vancomycin   S   Blood Culture (routine x 2)     Status: None (Preliminary result)   Collection Time: 03/03/24  1:01 AM   Specimen: BLOOD  Result Value Ref Range Status   Specimen Description BLOOD SITE NOT SPECIFIED  Final   Special Requests   Final    BOTTLES DRAWN AEROBIC AND ANAEROBIC Blood Culture results may not be optimal due to an inadequate volume of blood received in culture bottles   Culture   Final    NO GROWTH < 12 HOURS Performed at [facility], [address], [place], [place] [address]    Report Status PENDING  Incomplete  Blood Culture (routine x 2)     Status: None (Preliminary result)   Collection Time: 03/03/24  1:06 AM   Specimen: BLOOD  Result Value Ref Range Status   Specimen Description BLOOD SITE NOT SPECIFIED  Final   Special Requests   Final    BOTTLES DRAWN AEROBIC AND ANAEROBIC Blood Culture results may not be optimal due to an  inadequate volume of blood received in culture bottles   Culture   Final    NO GROWTH < 12 HOURS Performed at [facility], [address], [place], [place] [address]    Report Status PENDING  Incomplete  Resp panel by RT-PCR (RSV, Flu A&B, Covid) Anterior Nasal Swab     Status: None   Collection Time: 03/03/24  1:12 AM   Specimen: Anterior Nasal Swab  Result Value Ref Range Status   SARS Coronavirus 2 by RT PCR NEGATIVE NEGATIVE Final   Influenza A by PCR NEGATIVE NEGATIVE Final   Influenza B by PCR NEGATIVE NEGATIVE Final    Comment: (NOTE) The Xpert Xpress SARS-CoV-2/FLU/RSV plus assay is intended as an aid in the diagnosis of influenza from Nasopharyngeal swab specimens and should not be used as a sole basis for treatment. Nasal washings and aspirates are unacceptable for Xpert Xpress SARS-CoV-2/FLU/RSV testing.  Fact Sheet for Patients: BloggerCourse.com  Fact Sheet for Healthcare Providers: SeriousBroker.it  This test is not yet approved or cleared by the [place]  FDA and has been authorized for detection and/or diagnosis of SARS-CoV-2 by FDA under an Emergency Use Authorization (EUA). This EUA will remain in effect (meaning this test can be used) for the duration of the COVID-19 declaration under Section 564(b)(1) of the Act, 21 U.S.C. section 360bbb-3(b)(1), unless the authorization is terminated or revoked.     Resp Syncytial Virus by PCR NEGATIVE NEGATIVE Final    Comment: (NOTE) Fact Sheet for Patients: BloggerCourse.com  Fact Sheet for Healthcare Providers: SeriousBroker.it  This test is not yet approved or cleared by the [place]  FDA and has been authorized for detection and/or diagnosis of SARS-CoV-2 by FDA under an Emergency Use Authorization (EUA). This EUA will remain in effect (meaning this test can be used) for the duration of the COVID-19  declaration under Section 564(b)(1) of the Act, 21 U.S.C. section 360bbb-3(b)(1), unless the authorization is terminated or revoked.  Performed at [facility], [address], [place], [place] [address]      Radiology Studies: ECHOCARDIOGRAM COMPLETE Result Date: 03/03/2024    ECHOCARDIOGRAM REPORT   Patient Name:   Rockland Kotarski Date of Exam: 03/03/2024 Medical Rec #:  [id number]       Height:       65.0 in Accession #:    [id number]      Weight:       178.0 lb Date  of Birth:  [birth year]       BSA:          1.883 m Patient Age:    58 years        BP:           85/55 mmHg Patient Gender: M               HR:           67 bpm. Exam Location:  Inpatient Procedure: 2D Echo, Cardiac Doppler and Color Doppler (Both Spectral and Color            Flow Doppler were utilized during procedure). Indications:    Bacteremia  History:        Patient has prior history of Echocardiogram examinations, most                 recent 09/14/2023.  Sonographer:    Philomena Daring Referring Phys: CAMILA DELENA NED IMPRESSIONS  1. Left ventricular ejection fraction, by estimation, is 60 to 65%. The left ventricle has normal function. The left ventricle has no regional wall motion abnormalities. There is mild left ventricular hypertrophy. Left ventricular diastolic parameters were normal.  2. Right ventricular systolic function is normal. The right ventricular size is normal.  3. The mitral valve is abnormal. Trivial mitral valve regurgitation. No evidence of mitral stenosis.  4. Compared to 09/14/23 no real change mean gradient 18-16 mmHg peak 31.4->29 mmHg Difficult to r/o vegeatations on a severely calcified AV If concern for SBE is high can consider TEE. The aortic valve is tricuspid. There is severe calcifcation of the aortic valve. There is severe thickening of the aortic valve. Aortic valve regurgitation is mild. Moderate aortic valve stenosis.  5. The inferior vena cava is normal in size with greater than 50% respiratory  variability, suggesting right atrial pressure of 3 mmHg. FINDINGS  Left Ventricle: Left ventricular ejection fraction, by estimation, is 60 to 65%. The left ventricle has normal function. The left ventricle has no regional wall motion abnormalities. Strain was performed and the global longitudinal strain is indeterminate. The left ventricular internal cavity size was normal in size. There is mild left ventricular hypertrophy. Left ventricular diastolic parameters were normal. Right Ventricle: The right ventricular size is normal. No increase in right ventricular wall thickness. Right ventricular systolic function is normal. Left Atrium: Left atrial size was normal in size. Right Atrium: Right atrial size was normal in size. Pericardium: There is no evidence of pericardial effusion. Mitral Valve: The mitral valve is abnormal. There is mild thickening of the mitral valve leaflet(s). There is mild calcification of the mitral valve leaflet(s). Mild mitral annular calcification. Trivial mitral valve regurgitation. No evidence of mitral valve stenosis. Tricuspid Valve: The tricuspid valve is normal in structure. Tricuspid valve regurgitation is mild . No evidence of tricuspid stenosis. Aortic Valve: Compared to 09/14/23 no real change mean gradient 18-16 mmHg peak 31.4->29 mmHg Difficult to r/o vegeatations on a severely calcified AV If concern for SBE is high can consider TEE. The aortic valve is tricuspid. There is severe calcifcation  of the aortic valve. There is severe thickening of the aortic valve. Aortic valve regurgitation is mild. Moderate aortic stenosis is present. Aortic valve mean gradient measures 16.0 mmHg. Aortic valve peak gradient measures 29.0 mmHg. Aortic valve area, by VTI measures 1.29 cm. Pulmonic Valve: The pulmonic valve was normal in structure. Pulmonic valve regurgitation is not visualized. No evidence of pulmonic stenosis. Aorta: The aortic root is  normal in size and structure. Venous: The  inferior vena cava is normal in size with greater than 50% respiratory variability, suggesting right atrial pressure of 3 mmHg. IAS/Shunts: No atrial level shunt detected by color flow Doppler. Additional Comments: 3D was performed not requiring image post processing on an independent workstation and was indeterminate.  LEFT VENTRICLE PLAX 2D LVIDd:         4.00 cm     Diastology LVIDs:         2.40 cm     LV e' medial:    10.20 cm/s LV PW:         1.00 cm     LV E/e' medial:  11.6 LV IVS:        1.30 cm     LV e' lateral:   12.30 cm/s LVOT diam:     1.80 cm     LV E/e' lateral: 9.6 LV SV:         73 LV SV Index:   39 LVOT Area:     2.54 cm  LV Volumes (MOD) LV vol d, MOD A2C: 67.6 ml LV vol d, MOD A4C: 99.7 ml LV vol s, MOD A2C: 17.5 ml LV vol s, MOD A4C: 27.4 ml LV SV MOD A2C:     50.1 ml LV SV MOD A4C:     99.7 ml LV SV MOD BP:      62.3 ml RIGHT VENTRICLE             IVC RV S prime:     13.50 cm/s  IVC diam: 1.40 cm TAPSE (M-mode): 2.5 cm LEFT ATRIUM             Index        RIGHT ATRIUM           Index LA diam:        3.70 cm 1.97 cm/m   RA Area:     16.70 cm LA Vol (A2C):   48.8 ml 25.92 ml/m  RA Volume:   45.00 ml  23.90 ml/m LA Vol (A4C):   43.4 ml 23.05 ml/m LA Biplane Vol: 46.7 ml 24.81 ml/m  AORTIC VALVE AV Area (Vmax):    1.24 cm AV Area (Vmean):   1.29 cm AV Area (VTI):     1.29 cm AV Vmax:           269.33 cm/s AV Vmean:          182.000 cm/s AV VTI:            0.567 m AV Peak Grad:      29.0 mmHg AV Mean Grad:      16.0 mmHg LVOT Vmax:         131.50 cm/s LVOT Vmean:        92.250 cm/s LVOT VTI:          0.286 m LVOT/AV VTI ratio: 0.51  AORTA Ao Root diam: 3.20 cm Ao Asc diam:  3.50 cm MITRAL VALVE MV Area (PHT): 3.37 cm     SHUNTS MV Decel Time: 225 msec     Systemic VTI:  0.29 m MV E velocity: 118.00 cm/s  Systemic Diam: 1.80 cm MV A velocity: 72.80 cm/s MV E/A ratio:  1.62 Maude Emmer MD Electronically signed by Maude Emmer MD Signature Date/Time: 03/03/2024/11:20:03 AM    Final    US   Abdomen Limited RUQ (LIVER/GB) Result Date: 03/03/2024 CLINICAL DATA:  6194 Acute cholecystitis 6194 EXAM: ULTRASOUND ABDOMEN LIMITED  RIGHT UPPER QUADRANT COMPARISON:  March 03, 2024 FINDINGS: Gallbladder: Cholelithiasis. There is circumferential gallbladder wall thickening to 4 mm. There is trace pericholecystic fluid. Gallbladder wall appears edematous. No sonographic Beverley sign is reported. Common bile duct: Diameter: Visualized portion measures 2 mm, within normal limits. Majority is not visualized. Liver: No focal lesion identified. Heterogeneous and coarsened parenchymal echogenicity. Nodular liver contour. Portal vein is grossly patent on color Doppler imaging with normal direction of blood flow towards the liver is poorly visualized. Other: Small volume ascites. IMPRESSION: 1. Cholelithiasis with circumferential gallbladder wall thickening and trace pericholecystic fluid. Findings are equivocal for acute cholecystitis in the setting of cirrhosis as well as reported pancreatitis. If further imaging is desired, HIDA scan could differentiate. 2. Cirrhotic liver morphology. 3. Small volume ascites. Electronically Signed   By: Corean Salter M.D.   On: 03/03/2024 09:13   MR ABDOMEN MRCP W WO CONTAST Result Date: 03/03/2024 CLINICAL DATA:  Acute pancreatitis. EXAM: MRI ABDOMEN WITHOUT AND WITH CONTRAST (INCLUDING MRCP) TECHNIQUE: Multiplanar multisequence MR imaging of the abdomen was performed both before and after the administration of intravenous contrast. Heavily T2-weighted images of the biliary and pancreatic ducts were obtained, and three-dimensional MRCP images were rendered by post processing. CONTRAST:  8mL GADAVIST  GADOBUTROL  1 MMOL/ML IV SOLN COMPARISON:  CT scan earlier same day FINDINGS: Lower chest: Dependent atelectasis. Hepatobiliary: Nodular liver contour compatible cirrhosis. No arterial phase hyperenhancement or focal restricted diffusion within the liver parenchyma. Tiny hepatic cysts  are seen in both hepatic lobes. Gallbladder is distended with gallbladder wall edema/thickening. Numerous layering tiny 1-3 mm gallstones evident. MRCP imaging is markedly motion degraded limiting assessment. While no definite choledocholithiasis on MRCP images, axial T2 haste imaging (23/3) raises the question of a 3 mm stone at the ampulla. Common duct measures approximately 7 mm diameter. Common bile duct measures approximately 5 mm diameter. Pancreas: Pancreatic and peripancreatic edema evident, compatible with the reported clinical history of pancreatitis. 11 x 11 mm cystic lesion is identified in the body of pancreas (18/3) Spleen:  No splenomegaly. No suspicious focal mass lesion. Adrenals/Urinary Tract: No adrenal nodule or mass. Tiny T2 hyperintensities without enhancement in both kidneys are too small to characterize but statistically most likely benign cyst. No followup imaging is recommended. Stomach/Bowel: Stomach is decompressed. Duodenum is normally positioned as is the ligament of Treitz. No small bowel or colonic dilatation within the visualized abdomen. Vascular/Lymphatic: No abdominal aortic aneurysm. No abdominal aortic atherosclerotic calcification. Portal vein is patent. Recanalization of the paraumbilical vein evident. Prominent left abdominal varices. There is no gastrohepatic or hepatoduodenal ligament lymphadenopathy. No retroperitoneal or mesenteric lymphadenopathy. Other: No substantial intraperitoneal free fluid although there is some trace fluid around the liver and in the para colic gutters. Musculoskeletal: No focal suspicious marrow enhancement within the visualized bony anatomy. IMPRESSION: 1. Pancreatic and peripancreatic edema, compatible with the reported clinical history of pancreatitis. 2. Cholelithiasis with gallbladder wall edema/thickening, compatible with cholecystitis. Gallbladder wall thickening can also be seen in the setting of liver insufficiency/systemic disease. MRCP  imaging is markedly motion degraded limiting assessment. While no definite choledocholithiasis on MRCP images, axial T2 imaging raises the question of a 3 mm stone at the ampulla. ERCP could be used to further evaluate. 3. 11 x 11 mm cystic lesion in the body of pancreas. 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 Follow-up MRI abdomen with and without contrast recommended in 1 year. This recommendation follows ACR consensus guidelines: Management of Incidental Pancreatic Cysts: A  White Paper of the ACR Incidental Findings Committee. J Am Coll Radiol 2017;14:911-923. 4. Cirrhosis with evidence of portal hypertension including recanalization of the paraumbilical vein and prominent left abdominal varices. Electronically Signed   By: Camellia Candle M.D.   On: 03/03/2024 09:07   MR 3D Recon At Scanner Result Date: 03/03/2024 CLINICAL DATA:  Acute pancreatitis. EXAM: MRI ABDOMEN WITHOUT AND WITH CONTRAST (INCLUDING MRCP) TECHNIQUE: Multiplanar multisequence MR imaging of the abdomen was performed both before and after the administration of intravenous contrast. Heavily T2-weighted images of the biliary and pancreatic ducts were obtained, and three-dimensional MRCP images were rendered by post processing. CONTRAST:  8mL GADAVIST  GADOBUTROL  1 MMOL/ML IV SOLN COMPARISON:  CT scan earlier same day FINDINGS: Lower chest: Dependent atelectasis. Hepatobiliary: Nodular liver contour compatible cirrhosis. No arterial phase hyperenhancement or focal restricted diffusion within the liver parenchyma. Tiny hepatic cysts are seen in both hepatic lobes. Gallbladder is distended with gallbladder wall edema/thickening. Numerous layering tiny 1-3 mm gallstones evident. MRCP imaging is markedly motion degraded limiting assessment. While no definite choledocholithiasis on MRCP images, axial T2 haste imaging (23/3) raises the question of a 3 mm stone at the ampulla. Common duct measures approximately 7 mm diameter. Common  bile duct measures approximately 5 mm diameter. Pancreas: Pancreatic and peripancreatic edema evident, compatible with the reported clinical history of pancreatitis. 11 x 11 mm cystic lesion is identified in the body of pancreas (18/3) Spleen:  No splenomegaly. No suspicious focal mass lesion. Adrenals/Urinary Tract: No adrenal nodule or mass. Tiny T2 hyperintensities without enhancement in both kidneys are too small to characterize but statistically most likely benign cyst. No followup imaging is recommended. Stomach/Bowel: Stomach is decompressed. Duodenum is normally positioned as is the ligament of Treitz. No small bowel or colonic dilatation within the visualized abdomen. Vascular/Lymphatic: No abdominal aortic aneurysm. No abdominal aortic atherosclerotic calcification. Portal vein is patent. Recanalization of the paraumbilical vein evident. Prominent left abdominal varices. There is no gastrohepatic or hepatoduodenal ligament lymphadenopathy. No retroperitoneal or mesenteric lymphadenopathy. Other: No substantial intraperitoneal free fluid although there is some trace fluid around the liver and in the para colic gutters. Musculoskeletal: No focal suspicious marrow enhancement within the visualized bony anatomy. IMPRESSION: 1. Pancreatic and peripancreatic edema, compatible with the reported clinical history of pancreatitis. 2. Cholelithiasis with gallbladder wall edema/thickening, compatible with cholecystitis. Gallbladder wall thickening can also be seen in the setting of liver insufficiency/systemic disease. MRCP imaging is markedly motion degraded limiting assessment. While no definite choledocholithiasis on MRCP images, axial T2 imaging raises the question of a 3 mm stone at the ampulla. ERCP could be used to further evaluate. 3. 11 x 11 mm cystic lesion in the body of pancreas. 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 Follow-up MRI abdomen with and without contrast recommended in 1 year.  This recommendation follows ACR consensus guidelines: Management of Incidental Pancreatic Cysts: A White Paper of the ACR Incidental Findings Committee. J Am Coll Radiol 2017;14:911-923. 4. Cirrhosis with evidence of portal hypertension including recanalization of the paraumbilical vein and prominent left abdominal varices. Electronically Signed   By: Camellia Candle M.D.   On: 03/03/2024 09:07   CT CHEST ABDOMEN PELVIS W CONTRAST Result Date: 03/03/2024 CLINICAL DATA:  Sepsis EXAM: CT CHEST, ABDOMEN, AND PELVIS WITH CONTRAST TECHNIQUE: Multidetector CT imaging of the chest, abdomen and pelvis was performed following the standard protocol during bolus administration of intravenous contrast. RADIATION DOSE REDUCTION: This exam was performed according to the departmental dose-optimization program which  includes automated exposure control, adjustment of the mA and/or kV according to patient size and/or use of iterative reconstruction technique. 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 Cardiomegaly. Left and right coronary artery calcifications. No pericardial effusion. Mediastinum/Nodes: No enlarged mediastinal, hilar, or axillary lymph nodes. Thyroid  gland, trachea, and esophagus demonstrate no significant findings. Lungs/Pleura: Trace bilateral pleural effusions and associated atelectasis or consolidation. Diffuse bilateral bronchial wall thickening and interlobular septal thickening throughout the lung bases. Musculoskeletal: No chest wall abnormality. No acute osseous findings. CT ABDOMEN PELVIS FINDINGS Hepatobiliary: Coarse, nodular contour of the liver. Small gallstones. Gallbladder wall thickening and trace pericholecystic fluid. No biliary ductal dilatation. Pancreas: Diffuse inflammatory fat stranding and fluid about the pancreas. No  pancreatic ductal dilatation or acute pancreatic fluid collection. Spleen: Normal in size without significant abnormality. Adrenals/Urinary Tract: Adrenal glands are unremarkable. Kidneys are normal, without renal calculi, solid lesion, or hydronephrosis. Bladder is unremarkable. Stomach/Bowel: Stomach is within normal limits. Appendix appears normal. Diffuse wall thickening and mucosal hyperenhancement of the cecum (series 7, image 65). Vascular/Lymphatic: Aortic atherosclerosis. Recanalization of the umbilical vein with ventral abdominal and left upper quadrant varices. No enlarged abdominal or pelvic lymph nodes. Reproductive: No mass or other abnormality. Other: No abdominal wall hernia or abnormality. Small volume perihepatic ascites. Musculoskeletal: No acute osseous findings. IMPRESSION: 1. Diffuse inflammatory fat stranding and fluid about the pancreas, consistent with acute pancreatitis. No pancreatic ductal dilatation or acute pancreatic fluid collection. 2. Cholelithiasis. Gallbladder wall thickening and trace pericholecystic fluid, concerning for cholecystitis although somewhat nonspecific in the setting of ascites. Overall constellation of findings could be explained by cholecystitis and gallstone pancreatitis. 3. Diffuse wall thickening and mucosal hyperenhancement of the cecum likely related to portal colopathy. 4. Cirrhosis. Small volume perihepatic ascites. Recanalization of the umbilical vein with ventral abdominal and left upper quadrant varices. 5. Trace bilateral pleural effusions and associated atelectasis or consolidation. Diffuse bilateral bronchial wall thickening and interlobular septal thickening throughout the lung bases. Findings consistent with pulmonary edema. 6. Cardiomegaly and coronary artery disease. Aortic Atherosclerosis (ICD10-I70.0). Electronically Signed   By: Marolyn JONETTA Jaksch M.D.   On: 03/03/2024 06:10   DG Chest Port 1 View Result Date: 03/03/2024 CLINICAL DATA:  Possible  sepsis EXAM: PORTABLE CHEST 1 VIEW COMPARISON:  02/20/2024 FINDINGS: Cardiac shadow is stable. Lungs are well aerated bilaterally. Mediastinal markings are somewhat accentuated secondary to patient motion artifact. No bony abnormality is seen. IMPRESSION: No active disease. Electronically Signed   By: Oneil Devonshire M.D.   On: 03/03/2024 01:31    Scheduled Meds:  sodium chloride    Intravenous Once   sodium chloride    Intravenous Once   furosemide   40 mg Intravenous Daily   melatonin  5 mg Oral Once   pantoprazole  (PROTONIX ) IV  40 mg Intravenous Q12H   vancomycin   125 mg Oral Q12H   Continuous Infusions:  cefTRIAXone  (ROCEPHIN )  IV     metronidazole        LOS: 1 day   Ivonne Mustache, MD [facility] P7/13/2025, 7:55 AM

## 2024-03-04 NOTE — Plan of Care (Signed)

## 2024-03-04 NOTE — Progress Notes (Signed)
   [facility] has been requested to perform a transesophageal echocardiogram on Glendia Alas for bacteremia.     The patient does NOT have any absolute or relative contraindications to a Transesophageal Echocardiogram (TEE).  The patient has: No other conditions that may impact this procedure.    After careful review of history and examination, the risks and benefits of transesophageal echocardiogram have been explained including risks of esophageal damage, perforation (1:10,000 risk), bleeding, pharyngeal hematoma as well as other potential complications associated with conscious sedation including aspiration, arrhythmia, respiratory failure and death. Alternatives to treatment were discussed, questions were answered. Patient is willing to proceed.   Signed, Jon Garre Cybele Maule, [place]  03/04/2024 5:11 PM

## 2024-03-04 NOTE — Progress Notes (Signed)
 Notified by patient that blood was on the toilet and bathroom floor after urinating. Assessed pt's peri area and there are no signs of external bleeding, pt has no complaints of pain or discomfort. Notified Dr. Ivonne Mustache and CN Holli Socin, RN. Will continue to monitor.

## 2024-03-05 ENCOUNTER — Encounter (HOSPITAL_COMMUNITY)
Admission: EM | Disposition: A | Discharge status: Home Health | Payer: Self-pay | Source: Home / Self Care | Attending: Internal Medicine

## 2024-03-05 ENCOUNTER — Inpatient Hospital Stay (HOSPITAL_COMMUNITY)

## 2024-03-05 ENCOUNTER — Inpatient Hospital Stay (HOSPITAL_COMMUNITY): Payer: Self-pay | Admitting: Anesthesiology

## 2024-03-05 DIAGNOSIS — F1090 Alcohol use, unspecified, uncomplicated: Secondary | ICD-10-CM

## 2024-03-05 DIAGNOSIS — R7881 Bacteremia: Secondary | ICD-10-CM | POA: Diagnosis not present

## 2024-03-05 DIAGNOSIS — I330 Acute and subacute infective endocarditis: Secondary | ICD-10-CM

## 2024-03-05 DIAGNOSIS — I700 Atherosclerosis of aorta: Secondary | ICD-10-CM

## 2024-03-05 DIAGNOSIS — B954 Other streptococcus as the cause of diseases classified elsewhere: Secondary | ICD-10-CM

## 2024-03-05 LAB — BLOOD CULTURE ID PANEL (REFLEXED) - BCID2

## 2024-03-05 LAB — CBC
HCT: 26.7 % — ABNORMAL LOW (ref 39.0–52.0)
Hemoglobin: 8.7 g/dL — ABNORMAL LOW (ref 13.0–17.0)
MCH: 27.3 pg (ref 26.0–34.0)
MCHC: 32.6 g/dL (ref 30.0–36.0)
MCV: 83.7 fL (ref 80.0–100.0)
Platelets: 73 K/uL — ABNORMAL LOW (ref 150–400)
RBC: 3.19 MIL/uL — ABNORMAL LOW (ref 4.22–5.81)
RDW: 21.7 % — ABNORMAL HIGH (ref 11.5–15.5)
WBC: 11.4 K/uL — ABNORMAL HIGH (ref 4.0–10.5)
nRBC: 0 % (ref 0.0–0.2)

## 2024-03-05 LAB — COMPREHENSIVE METABOLIC PANEL WITH GFR
ALT: 30 U/L (ref 0–44)
AST: 62 U/L — ABNORMAL HIGH (ref 15–41)
Albumin: 1.9 g/dL — ABNORMAL LOW (ref 3.5–5.0)
Alkaline Phosphatase: 86 U/L (ref 38–126)
Anion gap: 8 (ref 5–15)
BUN: 7 mg/dL (ref 6–20)
CO2: 20 mmol/L — ABNORMAL LOW (ref 22–32)
Calcium: 7.5 mg/dL — ABNORMAL LOW (ref 8.9–10.3)
Chloride: 108 mmol/L (ref 98–111)
Creatinine, Ser: 1.04 mg/dL (ref 0.61–1.24)
GFR, Estimated: >60 mL/min (ref >60)
Glucose, Bld: 83 mg/dL (ref 70–99)
Potassium: 3 mmol/L — ABNORMAL LOW (ref 3.5–5.1)
Sodium: 136 mmol/L (ref 135–145)
Total Bilirubin: 3.6 mg/dL — ABNORMAL HIGH (ref 0.0–1.2)
Total Protein: 5.1 g/dL — ABNORMAL LOW (ref 6.5–8.1)

## 2024-03-05 LAB — BPAM PLATELET PHERESIS
Blood Product Expiration Date: 202507142359
Unit Type and Rh: 7300

## 2024-03-05 LAB — PREPARE PLATELET PHERESIS: Unit division: 0

## 2024-03-05 LAB — PROTIME-INR
INR: 2.4 — ABNORMAL HIGH (ref 0.8–1.2)
Prothrombin Time: 27.2 s — ABNORMAL HIGH (ref 11.4–15.2)

## 2024-03-05 SURGERY — TRANSESOPHAGEAL ECHOCARDIOGRAM (TEE) (CATHLAB)
Anesthesia: Monitor Anesthesia Care

## 2024-03-05 MED ORDER — POTASSIUM CHLORIDE CRYS ER 20 MEQ PO TBCR
40.0000 meq | EXTENDED_RELEASE_TABLET | ORAL | Status: AC
  Administered 2024-03-05 (×2): 40 meq via ORAL
  Filled 2024-03-05 (×2): qty 2

## 2024-03-05 MED ORDER — SODIUM CHLORIDE 0.9% FLUSH: 3.0000 mL | INTRAVENOUS | Status: DC | PRN

## 2024-03-05 MED ORDER — SODIUM CHLORIDE 0.9% FLUSH: 3.0000 mL | Freq: Two times a day (BID) | INTRAVENOUS | Status: DC

## 2024-03-05 NOTE — Progress Notes (Signed)
 [facility]  Date of Admission:  03/03/2024      Total days of antibiotics 2    Ceftriaxone  IV 7/11 >> c   Metronidazole  IV 7/11 >> c   Vancomycin  PO 7/13 >> c        ASSESSMENT: Juan [name] is a 58 y.o. male admitted with:   Bacteremia -  Fevers -  Leukocytosis -  Abnormal TTE with calcified AV cannot r/o Vegetation -  02/08/24 bcx strep parasanguinis -- PCN susceptible (0.12), Ceftriaxone  (mic </= 0.12) started on PO Cefdinir , repeat cultures 6/30 without growth. Subjective fevers reported on 7/09 BCx repeated growing Strep para sanguinis now Penicillin Intermediate. Now on ceftriaxone  / metronidazole  for treatment. High degree of concern for endocarditis here. Discussed again need for EGD to ensure TEE is safe to proceed with. Oropantogram negative. No findings of septic emboli on CT or MRI abdomen or brain MRI.   - continue ceftriaxone  / metronidazole   - FU for TEE results once they can be safely done  H/O C Difficile Colitis 3x in 2 years -  On PO Vanc 125 mg BID for prevention to continue. Tolerating well so far since switch to PO  - follow for any rash that develops.  - to continue throughout abx use and beyond x 1 week.   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 Study scheduled for tomorrow 7/15 with GI.   Vascular Access -  Can proceed with PICC Line tomorrow 7/15 if blood cultures from 7/12 remain negative.   Possible gallstone pancreatitis vs cholecystitis - CT AP showed concern for  acute pancreatitis, possible gallstone pancreatitis vs cholecystitis. MRCP showed possible cholecystitis.  - Gen surgery consulted, no plans for OR    PLAN: Continue ceftriaxone  / metronidazole   Continue PO vanc for cdiff prevention  FU EGD and TEE results  Can place PICC Line tomorrow if cultures from 7/11 remain negative - we will order tomorrow  Standard precautions   Principal  Problem:   Bacteremia Active Problems:   Anemia, chronic disease   Abnormal finding on GI tract imaging    sodium chloride    Intravenous Once   sodium chloride    Intravenous Once   ferrous sulfate   325 mg Oral Q breakfast   furosemide   40 mg Oral Daily   melatonin  5 mg Oral Once   pantoprazole  (PROTONIX ) IV  40 mg Intravenous Q12H   potassium chloride   40 mEq Oral Q2H   vancomycin   125 mg Oral Q12H    SUBJECTIVE: Doing OK. Wondering more about timing for studies as it relates to discharge.   Review of Systems: Review of Systems  Constitutional:  Negative for chills and fever.  Gastrointestinal:  Negative for nausea and vomiting.  Skin:  Negative for itching and rash.    Allergies  Allergen Reactions   Apple Juice Swelling and Other (See Comments)    Tongue swelling   Cucumber Extract Itching and Nausea And Vomiting    No extracts; just cucumber   Depakote Er [Divalproex Sodium Er] Swelling and Other (See Comments)    Tongue swelling   Depakote [Valproic Acid] Anaphylaxis   Peanut Butter Flavoring Agent (Non-Screening) Anaphylaxis and Swelling   Peanut Oil Swelling   Peanut-Containing Drug Products Swelling   Shellfish Allergy Itching and Swelling   Shrimp Extract Itching and Swelling   Apple Swelling   Cantaloupe Extract  Allergy Skin Test Rash   Codeine Itching, Rash and Other (See Comments)    Patient reports he can take CODONES without problems   Firvanq  [Vancomycin ] Rash   Lactose Intolerance (Gi) Diarrhea and Other (See Comments)    Indigestion, Stomach pain, Flatulence    OBJECTIVE: Vitals:   03/05/24 0507 03/05/24 0705 03/05/24 0741 03/05/24 0808  BP: 108/65  112/67 116/67  Pulse: 66  68 68  Resp: 18  19 19   Temp: 98.1 F (36.7 C)  100 F (37.8 C) 97.9 F (36.6 C)  TempSrc: Oral  Temporal Oral  SpO2: 96% 97% 98% 97%   There is no height or weight on file to calculate BMI.  Physical Exam Constitutional:      Appearance: Normal appearance. He  is not ill-appearing.     Comments: Seated on side of bed eating meal   Cardiovascular:     Rate and Rhythm: Normal rate.  Abdominal:     General: There is no distension.     Tenderness: There is no abdominal tenderness.  Skin:    General: Skin is warm and dry.     Capillary Refill: Capillary refill takes less than 2 seconds.     Findings: No rash.  Neurological:     Mental Status: He is alert and oriented to person, place, and time.     Lab Results Lab Results  Component Value Date   WBC 11.4 (H) 03/05/2024   HGB 8.7 (L) 03/05/2024   HCT 26.7 (L) 03/05/2024   MCV 83.7 03/05/2024   PLT 73 (L) 03/05/2024    Lab Results  Component Value Date   CREATININE 1.04 03/05/2024   BUN 7 03/05/2024   NA 136 03/05/2024   K 3.0 (L) 03/05/2024   CL 108 03/05/2024   CO2 20 (L) 03/05/2024    Lab Results  Component Value Date   ALT 30 03/05/2024   AST 62 (H) 03/05/2024   ALKPHOS 86 03/05/2024   BILITOT 3.6 (H) 03/05/2024     Microbiology: Recent Results (from the past 240 hours)  Blood culture (routine single)     Status: Abnormal   Collection Time: 02/29/24  3:50 PM   Specimen: Blood   VB  Result Value Ref Range Status   BLOOD CULTURE, ROUTINE Final report (A)  Final   Organism ID, Bacteria Comment (A)  Final    Comment: Streptococcus parasanguinis Recovered from aerobic and anaerobic bottles.    Antimicrobial Susceptibility Comment  Final    Comment:       ** S = Susceptible; I = Intermediate; R = Resistant **                    P = Positive; N = Negative             MICS are expressed in micrograms per mL    Antibiotic                 RSLT#1    RSLT#2    RSLT#3    RSLT#4 Cefepime                        S Cefotaxime                     S Ceftriaxone                     S Chloramphenicol  S Clindamycin                    S Erythromycin                    R Levofloxacin                   S Penicillin                     I Vancomycin                      S    Blood Culture (routine x 2)     Status: None (Preliminary result)   Collection Time: 03/03/24  1:01 AM   Specimen: BLOOD  Result Value Ref Range Status   Specimen Description BLOOD SITE NOT SPECIFIED  Final   Special Requests   Final    BOTTLES DRAWN AEROBIC AND ANAEROBIC Blood Culture results may not be optimal due to an inadequate volume of blood received in culture bottles   Culture   Final    NO GROWTH 2 DAYS Performed at [facility], [address], [place], [place] [address]    Report Status PENDING  Incomplete  Blood Culture (routine x 2)     Status: None (Preliminary result)   Collection Time: 03/03/24  1:06 AM   Specimen: BLOOD  Result Value Ref Range Status   Specimen Description BLOOD SITE NOT SPECIFIED  Final   Special Requests   Final    BOTTLES DRAWN AEROBIC AND ANAEROBIC Blood Culture results may not be optimal due to an inadequate volume of blood received in culture bottles   Culture   Final    NO GROWTH 2 DAYS Performed at [facility], [address], [place], [place] [address]    Report Status PENDING  Incomplete  Resp panel by RT-PCR (RSV, Flu A&B, Covid) Anterior Nasal Swab     Status: None   Collection Time: 03/03/24  1:12 AM   Specimen: Anterior Nasal Swab  Result Value Ref Range Status   SARS Coronavirus 2 by RT PCR NEGATIVE NEGATIVE Final   Influenza A by PCR NEGATIVE NEGATIVE Final   Influenza B by PCR NEGATIVE NEGATIVE Final    Comment: (NOTE) The Xpert Xpress SARS-CoV-2/FLU/RSV plus assay is intended as an aid in the diagnosis of influenza from Nasopharyngeal swab specimens and should not be used as a sole basis for treatment. Nasal washings and aspirates are unacceptable for Xpert Xpress SARS-CoV-2/FLU/RSV testing.  Fact Sheet for Patients: BloggerCourse.com  Fact Sheet for Healthcare Providers: SeriousBroker.it  This test is not yet approved or cleared by the [place]  FDA  and has been authorized for detection and/or diagnosis of SARS-CoV-2 by FDA under an Emergency Use Authorization (EUA). This EUA will remain in effect (meaning this test can be used) for the duration of the COVID-19 declaration under Section 564(b)(1) of the Act, 21 U.S.C. section 360bbb-3(b)(1), unless the authorization is terminated or revoked.     Resp Syncytial Virus by PCR NEGATIVE NEGATIVE Final    Comment: (NOTE) Fact Sheet for Patients: BloggerCourse.com  Fact Sheet for Healthcare Providers: SeriousBroker.it  This test is not yet approved or cleared by the [place]  FDA and has been authorized for detection and/or diagnosis of SARS-CoV-2 by FDA under an Emergency Use Authorization (EUA). This EUA will remain in effect (meaning this test can be used) for the duration of the  COVID-19 declaration under Section 564(b)(1) of the Act, 21 U.S.C. section 360bbb-3(b)(1), unless the authorization is terminated or revoked.  Performed at [facility], [address], [place], [place] [address]     Corean Fireman, MSN, NP-C [facility] Pager: [phone]  03/05/2024    Total Encounter Time: 15 min

## 2024-03-05 NOTE — Progress Notes (Signed)
 Patient ID: Juan [name], male   DOB: [birth year], 58 y.o.   MRN: [id number]    Progress Note   Subjective  Day #3 CC; recurrent fevers in setting of strep parapsilosis bacteremia, possible endocarditis Patient with underlying EtOH cirrhosis, decompensated with MELD-Na=27  Ceftriaxone  IV Metronidazole  IV Vancomycin  p.o. twice daily while on antibiotics given previous history of C. 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  Small volume perihepatic ascites, recannulization of the umbilical vein with ventral abdominal and left upper quadrant varices  Patient has no GI complaints and did not have any GI complaints at the time of admission, he denies any recent abdominal pain or discomfort, no changes in bowel habits, no bleeding, no nausea or vomiting.  Was readmitted due to fever of 102.    Objective   Vital signs in last 24 hours: Temp:  [97.6 F (36.4 C)-100 F (37.8 C)] 97.9 F (36.6 C) (07/14 0808) Pulse Rate:  [66-73] 68 (07/14 0808) Resp:  [16-19] 19 (07/14 0808) BP: (103-116)/(65-69) 116/67 (07/14 0808) SpO2:  [96 %-100 %] 97 % (07/14 0808) FiO2 (%):  [21 %] 21 % (07/14 0705) Last BM Date : 03/04/24 General:    Older white male in NAD mentating well Heart:  Regular rate and rhythm; no murmurs Lungs: Respirations even and unlabored, lungs CTA bilaterally Abdomen:  Soft, nontender and nondistended.  No appreciable fluid wave normal  bowel sounds. Extremities:  Without edema. Neurologic:  Alert and oriented,  grossly normal neurologically. Psych:  Cooperative. Normal mood and affect.  Intake/Output from previous day: 07/13 0701 - 07/14 0700 In: 826 [P.O.:826] Out: 1775 [Urine:1775] Intake/Output this shift: Total I/O In: 537.2 [P.O.:236; IV Piggyback:301.2] Out: 200 [Urine:200]  Lab Results: Recent Labs    03/03/24 0505 03/04/24 0903 03/05/24 0637  WBC 17.3* 12.6* 11.4*  HGB 6.7* 8.1* 8.7*  HCT 20.8* 25.4* 26.7*  PLT 56* 59* 73*   BMET Recent Labs    03/03/24 0505 03/03/24 1148 03/05/24 0637  NA 131* 134* 136  K 3.4* 3.5 3.0*  CL 106 106 108  CO2 17* 16* 20*  GLUCOSE 94 95 83  BUN 9 10 7   CREATININE 1.22 1.16 1.04  CALCIUM  8.2* 8.1* 7.5*   LFT Recent Labs    03/05/24 0637  PROT 5.1*  ALBUMIN 1.9*  AST 62*  ALT 30  ALKPHOS 86  BILITOT 3.6*   PT/INR Recent Labs    03/03/24 0101  LABPROT 26.9*  INR 2.4*    Studies/Results: DG Orthopantogram Result Date: 03/04/2024 EXAM: ORTHOPANTOMOGRAM XRAY, 1 VIEW 03/04/2024 08:03:00 AM TECHNIQUE: Panorex view of the mandible. COMPARISON: MR head without contrast 02/27/2024. CLINICAL HISTORY: 01364 Infection [id number]. Infection FINDINGS: DENTAL: Multiple dental fillings are in place. Root canal is present in the left second maxillary incisor. No focal dental caries or periapical lucency is present. BONES: No acute or healing fracture is present. TMJ: No dislocation. SOFT TISSUES: The soft tissues are unremarkable. IMPRESSION: 1. No acute osseous abnormality. 2. No evidence of infection. Electronically signed by: Lonni Necessary MD 03/04/2024 11:51 AM EDT  RP Workstation: [id number]       Assessment / Plan:    #41 58 year old white male with history of decompensated EtOH related cirrhosis with MEL D N/A=27 Platelets 73/INR 2.4/pro time 26.9  #2 strep parapsilosis  bacteremia-positive blood culture 02/29/2024, had been discharged home on  antibiotics and readmitted 03/03/2024 with recurrent fever to 102  Currently on IV ceftriaxone  and IV metronidazole   Infectious disease following, requesting TEE  Cardiology requests EGD prior to TEE given his history of decompensated cirrhosis  #3 history of recurrent C. difficile-covering with low-dose vancomycin  per ID #4 cecal wall thickening noted on CT scan as well as inflammatory stranding around the pancreas and gallbladder wall thickening with trace pericholecystic fluid.  Patient has no GI symptoms whatsoever suspect these findings are secondary to portal hypertension   Plan; patient will be scheduled for EGD with Dr. Suzann tomorrow Tuesday, 03/06/2024 in a.m. seizure was discussed in detail with the patient including indications risks and benefits and he is agreeable to proceed. N.p.o. after midnight Repeat INR today     Principal Problem:   Bacteremia Active Problems:   Anemia, chronic disease   Abnormal finding on GI tract imaging     LOS: 2 days   [name] PA-C 03/05/2024, 11:33 AM

## 2024-03-05 NOTE — Telephone Encounter (Addendum)
 Update from Holley Herring: Patient has been admitted to hospital. Was due to have picc placed tomorrow.   Enis Kleine, LPN

## 2024-03-05 NOTE — Plan of Care (Signed)

## 2024-03-05 NOTE — Telephone Encounter (Signed)
 Discussed with Corean Fireman, NP, patient likely won't be discharged before tomorrow. Message sent to Rosina Edison with IR to cancel PICC placement appointment tomorrow.   Sheyenne Konz, BSN, RN

## 2024-03-05 NOTE — Progress Notes (Signed)
 PROGRESS NOTE  Juan [name]  FMW:[id number] DOB: 1966/05/07 DOA: 03/03/2024 PCP: Alvia Bring, DO   Brief Narrative: Patient is a 58 year old male with past medical history significant for hypertension, cirrhosis with portal hypertension, alcohol abuse, C. difficile GI bleed, PTSD, bipolar disorder, GERD who presented to the emergency department with complaint of fever.  Developed fever in early June on vacation to [place] .  Patient was first seen at [facility] ED on 6/18 .Blood culture sent there came out to be positive for Strepto  para sanguinous and he followed up with his PCP on 6/22, started on cefdinir  and referred him to ID.  Patient was seen in ID clinic on 7/10 and the plan was to do 6 weeks course of IV antibiotics for empiric endocarditis coverage.  Blood cultures sent on 7/9 showed gram-positive cocci and he was called for admission.  Recent history of dental procedure.  History of C. difficile in past 2 years.  He was spiking fevers at home.  On presentation, blood pressure was soft, he was febrile.    Lactate was elevated in the range of 4.  Patient has been admitted for the management of gram-positive bacteremia.  ID following.  CT abdomen showed acute gallstone pancreatitis versus cholecystitis.  MRCP was also suggestive of  cholecystitis .  General surgery, GI consulted.  Hospital course also remarkable for anemia requiring blood transfusion.. Plan for TEE but cardiology requesting to rule out esophageal varices by endoscopy before proceeding with it  Assessment & Plan:  Principal Problem:   Bacteremia Active Problems:   Anemia, chronic disease   Abnormal finding on GI tract imaging   Sepsis secondary to gram-positive bacteremia: Presented with fever, lactic acidosis.  Initial WBC count was normal but spiked to 17.3 later.  Blood cultures collected on 7/9 showed gram-positive cocci in pairs/chains.  ID consulted and following. Currently on ceftriaxone ,  metronidazole .  Also on oral vancomycin  while on other  antibiotic due to H/O C diff.  Echo showed EF of 60 to 65%, normal right ventricular function.  Echo cundnt rule out atrial valve vegetation, severe thickening of aortic valve, moderate aortic valve stenosis, mild thickening of the mitral valve leaflet.  Plan for TEE but cardiology requesting to rule out esophageal varices by endoscopy before proceeding with it Plan is also to obtain orthopantogram.  Follow-up repeat blood cultures: No growth till date.  Acute gallstone pancreatitis/possible cholecystitis: CT  Imaging showed diffuse inflammatory fat stranding in fluid around pancreas consistent with acute pancreatitis.  Also showed cholelithiasis, gallbladder thickening and trace pericholecystic fluid concerning for cholecystitis, cirrhosis, hyperenhancement of the cecum.  MRCP also suggested cholecystitis.. No abdominal pain, nausea or vomiting.  No history of recent alcohol use.  Exam not consistent with cholecystitis due to lack of pain or nausea.  As per general surgery, he is not a candidate for surgical intervention given his cirrhosis.  General surgery recommended percutaneous cholecystostomy tube by IR if there is ongoing concern for cholecystitis.  Imaging findings could be secondary to portal hypertension and hypoalbuminemia. they recommended management of his gallbladder/liver problem to the transplant center.  General surgery signed off.  MRCP also showed pancreatic lesion of 11X 11 mm cystic lesion in the body of pancreas  History of liver cirrhosis/portal hypertension/alcohol abuse: Following with transplant clinic at [facility].  History of decompensated cirrhosis.  GI following here.  Currently not taking alcohol.  Has trace lower extremity edema which improved with Lasix .  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  Hemoglobin of 6 on presentation.  Given a unit of blood transfusion.  Monitor hemoglobin.  Stable in the range of 8 today.  No evidence of acute blood loss.  GI was consulted, no plan for further workup due to lack of evidence of GI bleed.  Started on iron supplementation  Hypokalemia: Supplemented with oral potassium        DVT prophylaxis:SCDs Start: 03/03/24 0416     Code Status: Full Code  Family Communication: Discussed with wife at bedside on 7/14  Patient status:Inpatient  Patient is from :home  Anticipated discharge un:ynfz  Estimated DC date: After full workup   Consultants: ID, GI, general surgery  Procedures: None  Antimicrobials:  Anti-infectives (From admission, onward)    Start     Dose/Rate Route Frequency Ordered Stop   03/04/24 1000  metroNIDAZOLE  (FLAGYL ) IVPB 500 mg        500 mg 100 mL/hr over 60 Minutes Intravenous Every 12 hours 03/04/24 0202     03/04/24 0800  vancomycin  (VANCOCIN ) capsule 125 mg        125 mg Oral Every 12 hours 03/04/24 0148     03/04/24 0800  cefTRIAXone  (ROCEPHIN ) 2 g in sodium chloride  0.9 % 100 mL IVPB        2 g 200 mL/hr over 30 Minutes Intravenous Every 24 hours 03/04/24 0202     03/03/24 2200  vancomycin  (VANCOREADY) IVPB 1500 mg/300 mL  Status:  Discontinued        1,500 mg 150 mL/hr over 120 Minutes Intravenous Every 24 hours 03/03/24 0509 03/04/24 0746   03/03/24 1000  metroNIDAZOLE  (FLAGYL ) IVPB 500 mg  Status:  Discontinued        500 mg 100 mL/hr over 60 Minutes Intravenous Every 12 hours 03/03/24 0722 03/04/24 0148   03/03/24 0600  aztreonam  (AZACTAM ) 2 g in sodium chloride  0.9 % 100 mL IVPB  Status:  Discontinued        2 g 200 mL/hr over 30 Minutes Intravenous Every 8 hours 03/03/24 0445 03/03/24 0508   03/03/24 0600  ceFEPIme  (MAXIPIME ) 2 g in sodium chloride  0.9 % 100 mL IVPB  Status:  Discontinued        2 g 200 mL/hr over 30 Minutes Intravenous Every 8 hours 03/03/24 0509 03/04/24 0148    03/03/24 0115  aztreonam  (AZACTAM ) 2 g in sodium chloride  0.9 % 100 mL IVPB        2 g 200 mL/hr over 30 Minutes Intravenous  Once 03/03/24 0101 03/03/24 0149   03/03/24 0115  metroNIDAZOLE  (FLAGYL ) IVPB 500 mg        500 mg 100 mL/hr over 60 Minutes Intravenous  Once 03/03/24 0101 03/03/24 0244   03/03/24 0115  vancomycin  (VANCOCIN ) IVPB 1000 mg/200 mL premix  Status:  Discontinued        1,000 mg 200 mL/hr over 60 Minutes Intravenous  Once 03/03/24 0101 03/03/24 0104   03/03/24 0115  vancomycin  (VANCOREADY) IVPB 1500 mg/300 mL        1,500 mg 75 mL/hr over 240 Minutes Intravenous  Once 03/03/24 0110 03/03/24 0432       Subjective: Patient seen and examined at bedside today.  Comfortable.  He was returned from TEE.  Lower extremity edema have improved. Denies any new problems.  Afebrile  Objective: Vitals:   03/05/24 0507 03/05/24 9294 03/05/24 0741 03/05/24 9191  BP: 108/65  112/67 116/67  Pulse: 66  68 68  Resp: 18  19 19   Temp: 98.1 F (36.7 C)  100 F (37.8 C) 97.9 F (36.6 C)  TempSrc: Oral  Temporal Oral  SpO2: 96% 97% 98% 97%    Intake/Output Summary (Last 24 hours) at 03/05/2024 1007 Last data filed at 03/05/2024 1000 Gross per 24 hour  Intake 891.24 ml  Output 1975 ml  Net -1083.76 ml   There were no vitals filed for this visit.  Examination:  General exam: Overall comfortable, not in distress HEENT: PERRL,mild icterus Respiratory system:  no wheezes or crackles  Cardiovascular system: S1 & S2 heard, RRR.  Gastrointestinal system: Abdomen is nondistended, soft and nontender. Central nervous system: Alert and oriented Extremities: No edema, no clubbing ,no cyanosis Skin: No rashes, no ulcers,mild  icterus     Data Reviewed: I have personally reviewed following labs and imaging studies  CBC: Recent Labs  Lab 02/29/24 1544 03/03/24 0101 03/03/24 0505 03/04/24 0903 03/05/24 0637  WBC 10.1 8.5 17.3* 12.6* 11.4*  NEUTROABS 7.9* 7.2  --   --   --    HGB 8.0* 7.6* 6.7* 8.1* 8.7*  HCT 24.7* 24.2* 20.8* 25.4* 26.7*  MCV 82 85.5 84.9 85.2 83.7  PLT 66* 57* 56* 59* 73*   Basic Metabolic Panel: Recent Labs  Lab 02/29/24 1544 03/03/24 0101 03/03/24 0505 03/03/24 1148 03/05/24 0637  NA 132* 132* 131* 134* 136  K 3.7 3.2* 3.4* 3.5 3.0*  CL 101 108 106 106 108  CO2 17* 14* 17* 16* 20*  GLUCOSE 131* 101* 94 95 83  BUN 16 9 9 10 7   CREATININE 1.42* 1.19 1.22 1.16 1.04  CALCIUM  8.7 7.8* 8.2* 8.1* 7.5*  MG 1.2*  --   --   --   --      Recent Results (from the past 240 hours)  Blood culture (routine single)     Status: Abnormal   Collection Time: 02/29/24  3:50 PM   Specimen: Blood   VB  Result Value Ref Range Status   BLOOD CULTURE, ROUTINE Final report (A)  Final   Organism ID, Bacteria Comment (A)  Final    Comment: Streptococcus parasanguinis Recovered from aerobic and anaerobic bottles.    Antimicrobial Susceptibility Comment  Final    Comment:       ** S = Susceptible; I = Intermediate; R = Resistant **                    P = Positive; N = Negative             MICS are expressed in micrograms per mL    Antibiotic                 RSLT#1    RSLT#2    RSLT#3    RSLT#4 Cefepime                        S Cefotaxime                     S Ceftriaxone                     S Chloramphenicol                S Clindamycin                    S Erythromycin   R Levofloxacin                   S Penicillin                     I Vancomycin                      S   Blood Culture (routine x 2)     Status: None (Preliminary result)   Collection Time: 03/03/24  1:01 AM   Specimen: BLOOD  Result Value Ref Range Status   Specimen Description BLOOD SITE NOT SPECIFIED  Final   Special Requests   Final    BOTTLES DRAWN AEROBIC AND ANAEROBIC Blood Culture results may not be optimal due to an inadequate volume of blood received in culture bottles   Culture   Final    NO GROWTH 2 DAYS Performed at [facility], [address], [place], [place] [address]    Report Status PENDING  Incomplete  Blood Culture (routine x 2)     Status: None (Preliminary result)   Collection Time: 03/03/24  1:06 AM   Specimen: BLOOD  Result Value Ref Range Status   Specimen Description BLOOD SITE NOT SPECIFIED  Final   Special Requests   Final    BOTTLES DRAWN AEROBIC AND ANAEROBIC Blood Culture results may not be optimal due to an inadequate volume of blood received in culture bottles   Culture   Final    NO GROWTH 2 DAYS Performed at [facility], [address], [place], [place] [address]    Report Status PENDING  Incomplete  Resp panel by RT-PCR (RSV, Flu A&B, Covid) Anterior Nasal Swab     Status: None   Collection Time: 03/03/24  1:12 AM   Specimen: Anterior Nasal Swab  Result Value Ref Range Status   SARS Coronavirus 2 by RT PCR NEGATIVE NEGATIVE Final   Influenza A by PCR NEGATIVE NEGATIVE Final   Influenza B by PCR NEGATIVE NEGATIVE Final    Comment: (NOTE) The Xpert Xpress SARS-CoV-2/FLU/RSV plus assay is intended as an aid in the diagnosis of influenza from Nasopharyngeal swab specimens and should not be used as a sole basis for treatment. Nasal washings and aspirates are unacceptable for Xpert Xpress SARS-CoV-2/FLU/RSV testing.  Fact Sheet for Patients: BloggerCourse.com  Fact Sheet for Healthcare Providers: SeriousBroker.it  This test is not yet approved or cleared by the [place]  FDA and has been authorized for detection and/or diagnosis of SARS-CoV-2 by FDA under an Emergency Use Authorization (EUA). This EUA will remain in effect (meaning this test can be used) for the duration of the COVID-19 declaration under Section 564(b)(1) of the Act, 21 U.S.C. section 360bbb-3(b)(1), unless the authorization is terminated or revoked.     Resp Syncytial Virus by PCR NEGATIVE NEGATIVE Final    Comment: (NOTE) Fact Sheet for  Patients: BloggerCourse.com  Fact Sheet for Healthcare Providers: SeriousBroker.it  This test is not yet approved or cleared by the [place]  FDA and has been authorized for detection and/or diagnosis of SARS-CoV-2 by FDA under an Emergency Use Authorization (EUA). This EUA will remain in effect (meaning this test can be used) for the duration of the COVID-19 declaration under Section 564(b)(1) of the Act, 21 U.S.C. section 360bbb-3(b)(1), unless the authorization is terminated or revoked.  Performed at [facility], [address] [place], [place] [address]      Radiology Studies: DG  Orthopantogram Result Date: 03/04/2024 EXAM: ORTHOPANTOMOGRAM XRAY, 1 VIEW 03/04/2024 08:03:00 AM TECHNIQUE: Panorex view of the mandible. COMPARISON: MR head without contrast 02/27/2024. CLINICAL HISTORY: 01364 Infection [id number]. Infection FINDINGS: DENTAL: Multiple dental fillings are in place. Root canal is present in the left second maxillary incisor. No focal dental caries or periapical lucency is present. BONES: No acute or healing fracture is present. TMJ: No dislocation. SOFT TISSUES: The soft tissues are unremarkable. IMPRESSION: 1. No acute osseous abnormality. 2. No evidence of infection. Electronically signed by: Lonni Necessary MD 03/04/2024 11:51 AM EDT RP Workstation: [id number]   ECHOCARDIOGRAM COMPLETE Result Date: 03/03/2024    ECHOCARDIOGRAM REPORT   Patient Name:   Adrin Julian Date of Exam: 03/03/2024 Medical Rec #:  [id number]       Height:       65.0 in Accession #:    [id number]      Weight:       178.0 lb Date of Birth:  12/12/1965       BSA:          1.883 m Patient Age:    58 years        BP:           85/55 mmHg Patient Gender: M               HR:           67 bpm. Exam Location:  Inpatient Procedure: 2D Echo, Cardiac Doppler and Color Doppler (Both Spectral and Color            Flow Doppler were utilized during  procedure). Indications:    Bacteremia  History:        Patient has prior history of Echocardiogram examinations, most                 recent 09/14/2023.  Sonographer:    Philomena Daring Referring Phys: CAMILA DELENA NED IMPRESSIONS  1. Left ventricular ejection fraction, by estimation, is 60 to 65%. The left ventricle has normal function. The left ventricle has no regional wall motion abnormalities. There is mild left ventricular hypertrophy. Left ventricular diastolic parameters were normal.  2. Right ventricular systolic function is normal. The right ventricular size is normal.  3. The mitral valve is abnormal. Trivial mitral valve regurgitation. No evidence of mitral stenosis.  4. Compared to 09/14/23 no real change mean gradient 18-16 mmHg peak 31.4->29 mmHg Difficult to r/o vegeatations on a severely calcified AV If concern for SBE is high can consider TEE. The aortic valve is tricuspid. There is severe calcifcation of the aortic valve. There is severe thickening of the aortic valve. Aortic valve regurgitation is mild. Moderate aortic valve stenosis.  5. The inferior vena cava is normal in size with greater than 50% respiratory variability, suggesting right atrial pressure of 3 mmHg. FINDINGS  Left Ventricle: Left ventricular ejection fraction, by estimation, is 60 to 65%. The left ventricle has normal function. The left ventricle has no regional wall motion abnormalities. Strain was performed and the global longitudinal strain is indeterminate. The left ventricular internal cavity size was normal in size. There is mild left ventricular hypertrophy. Left ventricular diastolic parameters were normal. Right Ventricle: The right ventricular size is normal. No increase in right ventricular wall thickness. Right ventricular systolic function is normal. Left Atrium: Left atrial size was normal in size. Right Atrium: Right atrial size was normal in size. Pericardium: There is no evidence of pericardial effusion. Mitral  Valve: The mitral valve is  abnormal. There is mild thickening of the mitral valve leaflet(s). There is mild calcification of the mitral valve leaflet(s). Mild mitral annular calcification. Trivial mitral valve regurgitation. No evidence of mitral valve stenosis. Tricuspid Valve: The tricuspid valve is normal in structure. Tricuspid valve regurgitation is mild . No evidence of tricuspid stenosis. Aortic Valve: Compared to 09/14/23 no real change mean gradient 18-16 mmHg peak 31.4->29 mmHg Difficult to r/o vegeatations on a severely calcified AV If concern for SBE is high can consider TEE. The aortic valve is tricuspid. There is severe calcifcation  of the aortic valve. There is severe thickening of the aortic valve. Aortic valve regurgitation is mild. Moderate aortic stenosis is present. Aortic valve mean gradient measures 16.0 mmHg. Aortic valve peak gradient measures 29.0 mmHg. Aortic valve area, by VTI measures 1.29 cm. Pulmonic Valve: The pulmonic valve was normal in structure. Pulmonic valve regurgitation is not visualized. No evidence of pulmonic stenosis. Aorta: The aortic root is normal in size and structure. Venous: The inferior vena cava is normal in size with greater than 50% respiratory variability, suggesting right atrial pressure of 3 mmHg. IAS/Shunts: No atrial level shunt detected by color flow Doppler. Additional Comments: 3D was performed not requiring image post processing on an independent workstation and was indeterminate.  LEFT VENTRICLE PLAX 2D LVIDd:         4.00 cm     Diastology LVIDs:         2.40 cm     LV e' medial:    10.20 cm/s LV PW:         1.00 cm     LV E/e' medial:  11.6 LV IVS:        1.30 cm     LV e' lateral:   12.30 cm/s LVOT diam:     1.80 cm     LV E/e' lateral: 9.6 LV SV:         73 LV SV Index:   39 LVOT Area:     2.54 cm  LV Volumes (MOD) LV vol d, MOD A2C: 67.6 ml LV vol d, MOD A4C: 99.7 ml LV vol s, MOD A2C: 17.5 ml LV vol s, MOD A4C: 27.4 ml LV SV MOD A2C:     50.1 ml  LV SV MOD A4C:     99.7 ml LV SV MOD BP:      62.3 ml RIGHT VENTRICLE             IVC RV S prime:     13.50 cm/s  IVC diam: 1.40 cm TAPSE (M-mode): 2.5 cm LEFT ATRIUM             Index        RIGHT ATRIUM           Index LA diam:        3.70 cm 1.97 cm/m   RA Area:     16.70 cm LA Vol (A2C):   48.8 ml 25.92 ml/m  RA Volume:   45.00 ml  23.90 ml/m LA Vol (A4C):   43.4 ml 23.05 ml/m LA Biplane Vol: 46.7 ml 24.81 ml/m  AORTIC VALVE AV Area (Vmax):    1.24 cm AV Area (Vmean):   1.29 cm AV Area (VTI):     1.29 cm AV Vmax:           269.33 cm/s AV Vmean:          182.000 cm/s AV VTI:  0.567 m AV Peak Grad:      29.0 mmHg AV Mean Grad:      16.0 mmHg LVOT Vmax:         131.50 cm/s LVOT Vmean:        92.250 cm/s LVOT VTI:          0.286 m LVOT/AV VTI ratio: 0.51  AORTA Ao Root diam: 3.20 cm Ao Asc diam:  3.50 cm MITRAL VALVE MV Area (PHT): 3.37 cm     SHUNTS MV Decel Time: 225 msec     Systemic VTI:  0.29 m MV E velocity: 118.00 cm/s  Systemic Diam: 1.80 cm MV A velocity: 72.80 cm/s MV E/A ratio:  1.62 Maude Emmer MD Electronically signed by Maude Emmer MD Signature Date/Time: 03/03/2024/11:20:03 AM    Final     Scheduled Meds:  sodium chloride    Intravenous Once   sodium chloride    Intravenous Once   ferrous sulfate   325 mg Oral Q breakfast   furosemide   40 mg Oral Daily   melatonin  5 mg Oral Once   pantoprazole  (PROTONIX ) IV  40 mg Intravenous Q12H   potassium chloride   40 mEq Oral Q2H   vancomycin   125 mg Oral Q12H   Continuous Infusions:  cefTRIAXone  (ROCEPHIN )  IV 2 g (03/05/24 0848)   metronidazole  500 mg (03/04/24 2220)     LOS: 2 days   Ivonne Mustache, MD [facility] P7/14/2025, 10:07 AM

## 2024-03-05 NOTE — Progress Notes (Signed)
 Patient scheduled for TEE today. 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  Last endoscopy was 2021 that showed no varices. At this time I feel risks outweigh benefits of TEE and am canceling procedure. If patient can undergo endoscopy showing no esophageal varices and platelet count improves then could consider TEE

## 2024-03-05 NOTE — Progress Notes (Signed)
 PHARMACY - PHYSICIAN COMMUNICATION CRITICAL VALUE ALERT - BLOOD CULTURE IDENTIFICATION (BCID)  Juan [name] is an 58 y.o. male who presented to [facility] on 03/03/2024 with a chief complaint of fever  Assessment:  1/4 BCX with strep spp  Name of physician (or Provider) Contacted: Jillian Buttery MD  Current antibiotics: PO vanc, IV flagyl ; ceftriaxone   Changes to prescribed antibiotics recommended:  No changes required  Results for orders placed or performed during the hospital encounter of 09/11/23  Blood Culture ID Panel (Reflexed) (Collected: 09/11/2023 12:03 PM)  Result Value Ref Range   Enterococcus faecalis DETECTED (A) NOT DETECTED   Enterococcus Faecium NOT DETECTED NOT DETECTED   Listeria monocytogenes NOT DETECTED NOT DETECTED   Staphylococcus species NOT DETECTED NOT DETECTED   Staphylococcus aureus (BCID) NOT DETECTED NOT DETECTED   Staphylococcus epidermidis NOT DETECTED NOT DETECTED   Staphylococcus lugdunensis NOT DETECTED NOT DETECTED   Streptococcus species NOT DETECTED NOT DETECTED   Streptococcus agalactiae NOT DETECTED NOT DETECTED   Streptococcus pneumoniae NOT DETECTED NOT DETECTED   Streptococcus pyogenes NOT DETECTED NOT DETECTED   A.calcoaceticus-baumannii NOT DETECTED NOT DETECTED   Bacteroides fragilis NOT DETECTED NOT DETECTED   Enterobacterales NOT DETECTED NOT DETECTED   Enterobacter cloacae complex NOT DETECTED NOT DETECTED   Escherichia coli NOT DETECTED NOT DETECTED   Klebsiella aerogenes NOT DETECTED NOT DETECTED   Klebsiella oxytoca NOT DETECTED NOT DETECTED   Klebsiella pneumoniae NOT DETECTED NOT DETECTED   Proteus species NOT DETECTED NOT DETECTED   Salmonella species NOT DETECTED NOT DETECTED   Serratia marcescens NOT DETECTED NOT DETECTED   Haemophilus influenzae NOT DETECTED NOT DETECTED   Neisseria meningitidis NOT DETECTED NOT DETECTED   Pseudomonas aeruginosa NOT DETECTED NOT DETECTED   Stenotrophomonas maltophilia NOT DETECTED  NOT DETECTED   Candida albicans NOT DETECTED NOT DETECTED   Candida auris NOT DETECTED NOT DETECTED   Candida glabrata NOT DETECTED NOT DETECTED   Candida krusei NOT DETECTED NOT DETECTED   Candida parapsilosis NOT DETECTED NOT DETECTED   Candida tropicalis NOT DETECTED NOT DETECTED   Cryptococcus neoformans/gattii NOT DETECTED NOT DETECTED   Vancomycin  resistance NOT DETECTED NOT DETECTED    Sharyne Glatter, PharmD, BCCCP Clinical Pharmacist 03/05/2024 6:26 PM

## 2024-03-06 ENCOUNTER — Inpatient Hospital Stay (HOSPITAL_COMMUNITY): Admitting: Anesthesiology

## 2024-03-06 ENCOUNTER — Other Ambulatory Visit: Payer: Self-pay

## 2024-03-06 ENCOUNTER — Encounter (HOSPITAL_COMMUNITY)
Admission: EM | Disposition: A | Discharge status: Home Health | Payer: Self-pay | Source: Home / Self Care | Attending: Internal Medicine

## 2024-03-06 ENCOUNTER — Encounter (HOSPITAL_COMMUNITY): Payer: Self-pay | Admitting: Internal Medicine

## 2024-03-06 ENCOUNTER — Telehealth: Payer: Self-pay

## 2024-03-06 ENCOUNTER — Encounter (HOSPITAL_COMMUNITY): Payer: Self-pay

## 2024-03-06 ENCOUNTER — Ambulatory Visit (HOSPITAL_COMMUNITY): Admission: RE | Admit: 2024-03-06 | Source: Ambulatory Visit

## 2024-03-06 DIAGNOSIS — K449 Diaphragmatic hernia without obstruction or gangrene: Secondary | ICD-10-CM

## 2024-03-06 DIAGNOSIS — I10 Essential (primary) hypertension: Secondary | ICD-10-CM | POA: Diagnosis not present

## 2024-03-06 DIAGNOSIS — K7460 Unspecified cirrhosis of liver: Secondary | ICD-10-CM | POA: Diagnosis not present

## 2024-03-06 DIAGNOSIS — K209 Esophagitis, unspecified without bleeding: Secondary | ICD-10-CM

## 2024-03-06 DIAGNOSIS — K766 Portal hypertension: Secondary | ICD-10-CM

## 2024-03-06 DIAGNOSIS — K3189 Other diseases of stomach and duodenum: Secondary | ICD-10-CM

## 2024-03-06 DIAGNOSIS — K31819 Angiodysplasia of stomach and duodenum without bleeding: Secondary | ICD-10-CM

## 2024-03-06 DIAGNOSIS — R7881 Bacteremia: Secondary | ICD-10-CM | POA: Diagnosis not present

## 2024-03-06 DIAGNOSIS — K2289 Other specified disease of esophagus: Secondary | ICD-10-CM

## 2024-03-06 HISTORY — PX: ESOPHAGOGASTRODUODENOSCOPY: SHX5428

## 2024-03-06 LAB — BASIC METABOLIC PANEL WITH GFR
Anion gap: 9 (ref 5–15)
BUN: 8 mg/dL (ref 6–20)
CO2: 21 mmol/L — ABNORMAL LOW (ref 22–32)
Calcium: 7.4 mg/dL — ABNORMAL LOW (ref 8.9–10.3)
Chloride: 107 mmol/L (ref 98–111)
Creatinine, Ser: 1.1 mg/dL (ref 0.61–1.24)
GFR, Estimated: >60 mL/min (ref >60)
Glucose, Bld: 81 mg/dL (ref 70–99)
Potassium: 3.6 mmol/L (ref 3.5–5.1)
Sodium: 137 mmol/L (ref 135–145)

## 2024-03-06 LAB — MAGNESIUM: Magnesium: 1.2 mg/dL — ABNORMAL LOW (ref 1.7–2.4)

## 2024-03-06 SURGERY — EGD (ESOPHAGOGASTRODUODENOSCOPY)
Anesthesia: Monitor Anesthesia Care

## 2024-03-06 MED ORDER — SODIUM CHLORIDE 0.9 % IV SOLN
INTRAVENOUS | Status: AC | PRN
  Administered 2024-03-06: 250 mL via INTRAVENOUS

## 2024-03-06 MED ORDER — PANTOPRAZOLE SODIUM 40 MG PO TBEC
40.0000 mg | DELAYED_RELEASE_TABLET | Freq: Every day | ORAL | Status: DC
  Administered 2024-03-08 – 2024-03-09 (×2): 40 mg via ORAL
  Filled 2024-03-06 (×2): qty 1

## 2024-03-06 MED ORDER — PROPOFOL 500 MG/50ML IV EMUL
INTRAVENOUS | Status: DC | PRN
  Administered 2024-03-06: 150 ug/kg/min via INTRAVENOUS

## 2024-03-06 MED ORDER — PHENYLEPHRINE 80 MCG/ML (10ML) SYRINGE FOR IV PUSH (FOR BLOOD PRESSURE SUPPORT)
PREFILLED_SYRINGE | INTRAVENOUS | Status: DC | PRN
  Administered 2024-03-06: 160 ug via INTRAVENOUS

## 2024-03-06 MED ORDER — PROPOFOL 10 MG/ML IV BOLUS
INTRAVENOUS | Status: DC | PRN
  Administered 2024-03-06: 20 mg via INTRAVENOUS

## 2024-03-06 MED ORDER — DEXMEDETOMIDINE HCL IN NACL 80 MCG/20ML IV SOLN
INTRAVENOUS | Status: DC | PRN
  Administered 2024-03-06: 8 ug via INTRAVENOUS

## 2024-03-06 MED ORDER — MAGNESIUM SULFATE 4 GM/100ML IV SOLN
4.0000 g | Freq: Once | INTRAVENOUS | Status: AC
  Administered 2024-03-06: 4 g via INTRAVENOUS
  Filled 2024-03-06: qty 100

## 2024-03-06 MED ORDER — LACTATED RINGERS IV SOLN: INTRAVENOUS | Status: DC | PRN

## 2024-03-06 NOTE — Progress Notes (Signed)
 PROGRESS NOTE  Juan [name]  FMW:[id number] DOB: Mar 24, 1966 DOA: 03/03/2024 PCP: Alvia Bring, DO   Brief Narrative: Patient is a 58 year old male with past medical history significant for hypertension, cirrhosis with portal hypertension, alcohol abuse, C. difficile GI bleed, PTSD, bipolar disorder, GERD who presented to the emergency department with complaint of fever.  Developed fever in early June on vacation to [place] .  Patient was first seen at [facility] ED on 6/18 .Blood culture sent there came out to be positive for Strepto  para sanguinous and he followed up with his PCP on 6/22, started on cefdinir  and referred him to ID.  Patient was seen in ID clinic on 7/10 and the plan was to do 6 weeks course of IV antibiotics for empiric endocarditis coverage.  Blood cultures sent on 7/9 showed gram-positive cocci and he was called for admission.  Recent history of dental procedure.  History of C. difficile in past 2 years.  He was spiking fevers at home.  On presentation, blood pressure was soft, he was febrile.    Lactate was elevated in the range of 4.  Patient has been admitted for the management of gram-positive bacteremia.  ID following.  CT abdomen showed acute gallstone pancreatitis versus cholecystitis.  MRCP was also suggestive of  cholecystitis .  General surgery, GI consulted.  Hospital course also remarkable for anemia requiring blood transfusion. Underwent EGD today without finding of esophageal varices, plan for TEE  Assessment & Plan:  Principal Problem:   Bacteremia Active Problems:   Anemia, chronic disease   Abnormal finding on GI tract imaging   Sepsis secondary to gram-positive bacteremia: Presented with fever, lactic acidosis.  Initial WBC count was normal but spiked to 17.3 later.  Blood cultures collected on 7/9 showed gram-positive cocci in pairs/chains.  ID consulted and following. Currently on ceftriaxone , metronidazole .  Also on oral vancomycin  while on  other  antibiotic due to H/O C diff.  Echo showed EF of 60 to 65%, normal right ventricular function.  Echo cundnt rule out atrial valve vegetation, severe thickening of aortic valve, moderate aortic valve stenosis, mild thickening of the mitral valve leaflet.  Plan for TEE . Normal orthopantogram.  Follow-up repeat blood cultures: One set showing strep species.  Repeat culture sent  Acute gallstone pancreatitis/possible cholecystitis: CT  Imaging showed diffuse inflammatory fat stranding in fluid around pancreas consistent with acute pancreatitis.  Also showed cholelithiasis, gallbladder thickening and trace pericholecystic fluid concerning for cholecystitis, cirrhosis, hyperenhancement of the cecum.  MRCP also suggested cholecystitis.. No abdominal pain, nausea or vomiting.  No history of recent alcohol use.  Exam not consistent with cholecystitis due to lack of pain or nausea.  As per general surgery, he is not a candidate for surgical intervention given his cirrhosis.  General surgery recommended percutaneous cholecystostomy tube by IR if there is ongoing concern for cholecystitis.  Imaging findings could be secondary to portal hypertension and hypoalbuminemia. they recommended management of his gallbladder/liver problem to the transplant center.  General surgery signed off.  MRCP also showed pancreatic lesion of 11X 11 mm cystic lesion in the body of pancreas  History of liver cirrhosis/portal hypertension/alcohol abuse: Following with transplant clinic at [facility].  History of decompensated cirrhosis.  GI following here.  Currently not taking alcohol.  Has trace lower extremity edema which improved with Lasix .  He declines to take Lasix  every day, wants to take as needed. EGD done on 7/15 did not show any esophageal varices but showed portal hypertensive  gastropathy  Acute on chronic normocytic anemia/iron deficiency/thrombocytopenia: This is likely from cirrhosis/portal gastropathy..  Hemoglobin of 6 on  presentation.  Given a unit of blood transfusion.  Monitor hemoglobin.  Stable in the range of 8 .  No evidence of acute blood loss.  GI was consulted, no plan for further workup due to lack of evidence of GI bleed.  Started on iron supplementation  Hypokalemia/hypomagnesemia: Supplemented with oral potassium and corrected.  Magnesium  supplemented        DVT prophylaxis:SCDs Start: 03/03/24 0416     Code Status: Full Code  Family Communication: Discussed with wife at bedside on 7/15  Patient status:Inpatient  Patient is from :home  Anticipated discharge un:ynfz  Estimated DC date: After full workup   Consultants: ID, GI, general surgery  Procedures: EGD  Antimicrobials:  Anti-infectives (From admission, onward)    Start     Dose/Rate Route Frequency Ordered Stop   03/04/24 1000  metroNIDAZOLE  (FLAGYL ) IVPB 500 mg        500 mg 100 mL/hr over 60 Minutes Intravenous Every 12 hours 03/04/24 0202     03/04/24 0800  vancomycin  (VANCOCIN ) capsule 125 mg        125 mg Oral Every 12 hours 03/04/24 0148     03/04/24 0800  cefTRIAXone  (ROCEPHIN ) 2 g in sodium chloride  0.9 % 100 mL IVPB        2 g 200 mL/hr over 30 Minutes Intravenous Every 24 hours 03/04/24 0202     03/03/24 2200  vancomycin  (VANCOREADY) IVPB 1500 mg/300 mL  Status:  Discontinued        1,500 mg 150 mL/hr over 120 Minutes Intravenous Every 24 hours 03/03/24 0509 03/04/24 0746   03/03/24 1000  metroNIDAZOLE  (FLAGYL ) IVPB 500 mg  Status:  Discontinued        500 mg 100 mL/hr over 60 Minutes Intravenous Every 12 hours 03/03/24 0722 03/04/24 0148   03/03/24 0600  aztreonam  (AZACTAM ) 2 g in sodium chloride  0.9 % 100 mL IVPB  Status:  Discontinued        2 g 200 mL/hr over 30 Minutes Intravenous Every 8 hours 03/03/24 0445 03/03/24 0508   03/03/24 0600  ceFEPIme  (MAXIPIME ) 2 g in sodium chloride  0.9 % 100 mL IVPB  Status:  Discontinued        2 g 200 mL/hr over 30 Minutes Intravenous Every 8 hours 03/03/24 0509  03/04/24 0148   03/03/24 0115  aztreonam  (AZACTAM ) 2 g in sodium chloride  0.9 % 100 mL IVPB        2 g 200 mL/hr over 30 Minutes Intravenous  Once 03/03/24 0101 03/03/24 0149   03/03/24 0115  metroNIDAZOLE  (FLAGYL ) IVPB 500 mg        500 mg 100 mL/hr over 60 Minutes Intravenous  Once 03/03/24 0101 03/03/24 0244   03/03/24 0115  vancomycin  (VANCOCIN ) IVPB 1000 mg/200 mL premix  Status:  Discontinued        1,000 mg 200 mL/hr over 60 Minutes Intravenous  Once 03/03/24 0101 03/03/24 0104   03/03/24 0115  vancomycin  (VANCOREADY) IVPB 1500 mg/300 mL        1,500 mg 75 mL/hr over 240 Minutes Intravenous  Once 03/03/24 0110 03/03/24 0432       Subjective: Patient seen and examined at bedside today.  Hemodynamically stable.  Comfortable lying in bed.  Not in any kind of discomfort.  No new complaints.  Anticipating TEE today  Objective: Vitals:   03/06/24 0743 03/06/24 0750  03/06/24 0758 03/06/24 0920  BP: (!) 91/52 (!) 95/57 100/69 115/75  Pulse: 64 (!) 58 (!) 59 (!) 58  Resp: (!) 23 17 (!) 22 18  Temp: (!) 97.3 F (36.3 C)   97.6 F (36.4 C)  TempSrc: Temporal   Oral  SpO2: 92% 95% 96% 99%  Weight:      Height:        Intake/Output Summary (Last 24 hours) at 03/06/2024 1037 Last data filed at 03/06/2024 0756 Gross per 24 hour  Intake 654.03 ml  Output 200 ml  Net 454.03 ml   Filed Weights   03/05/24 1832  Weight: 86 kg    Examination:  General exam: Overall comfortable, not in distress HEENT: PERRL Respiratory system:  no wheezes or crackles  Cardiovascular system: S1 & S2 heard, RRR.  Gastrointestinal system: Abdomen is nondistended, soft and nontender. Central nervous system: Alert and oriented Extremities: No edema, no clubbing ,no cyanosis Skin: No rashes, no ulcers,no icterus     Data Reviewed: I have personally reviewed following labs and imaging studies  CBC: Recent Labs  Lab 02/29/24 1544 03/03/24 0101 03/03/24 0505 03/04/24 0903 03/05/24 0637  WBC  10.1 8.5 17.3* 12.6* 11.4*  NEUTROABS 7.9* 7.2  --   --   --   HGB 8.0* 7.6* 6.7* 8.1* 8.7*  HCT 24.7* 24.2* 20.8* 25.4* 26.7*  MCV 82 85.5 84.9 85.2 83.7  PLT 66* 57* 56* 59* 73*   Basic Metabolic Panel: Recent Labs  Lab 02/29/24 1544 03/03/24 0101 03/03/24 0505 03/03/24 1148 03/05/24 0637 03/06/24 0638  NA 132* 132* 131* 134* 136 137  K 3.7 3.2* 3.4* 3.5 3.0* 3.6  CL 101 108 106 106 108 107  CO2 17* 14* 17* 16* 20* 21*  GLUCOSE 131* 101* 94 95 83 81  BUN 16 9 9 10 7 8   CREATININE 1.42* 1.19 1.22 1.16 1.04 1.10  CALCIUM  8.7 7.8* 8.2* 8.1* 7.5* 7.4*  MG 1.2*  --   --   --   --  1.2*     Recent Results (from the past 240 hours)  Blood culture (routine single)     Status: Abnormal   Collection Time: 02/29/24  3:50 PM   Specimen: Blood   VB  Result Value Ref Range Status   BLOOD CULTURE, ROUTINE Final report (A)  Final   Organism ID, Bacteria Comment (A)  Final    Comment: Streptococcus parasanguinis Recovered from aerobic and anaerobic bottles.    Antimicrobial Susceptibility Comment  Final    Comment:       ** S = Susceptible; I = Intermediate; R = Resistant **                    P = Positive; N = Negative             MICS are expressed in micrograms per mL    Antibiotic                 RSLT#1    RSLT#2    RSLT#3    RSLT#4 Cefepime                        S Cefotaxime                     S Ceftriaxone                     S Chloramphenicol  S Clindamycin                    S Erythromycin                    R Levofloxacin                   S Penicillin                     I Vancomycin                      S   Blood Culture (routine x 2)     Status: None (Preliminary result)   Collection Time: 03/03/24  1:01 AM   Specimen: BLOOD  Result Value Ref Range Status   Specimen Description BLOOD SITE NOT SPECIFIED  Final   Special Requests   Final    BOTTLES DRAWN AEROBIC AND ANAEROBIC Blood Culture results may not be optimal due to an inadequate volume of  blood received in culture bottles   Culture   Final    NO GROWTH 3 DAYS Performed at [facility], [address], [place], [place] [address]    Report Status PENDING  Incomplete  Blood Culture (routine x 2)     Status: None (Preliminary result)   Collection Time: 03/03/24  1:06 AM   Specimen: BLOOD  Result Value Ref Range Status   Specimen Description BLOOD SITE NOT SPECIFIED  Final   Special Requests   Final    BOTTLES DRAWN AEROBIC AND ANAEROBIC Blood Culture results may not be optimal due to an inadequate volume of blood received in culture bottles   Culture  Setup Time   Final    GRAM POSITIVE COCCI IN CHAINS AEROBIC BOTTLE ONLY CRITICAL RESULT CALLED TO, READ BACK BY AND VERIFIED WITH: PHARMD MADELINE MITCHELL ON 03/05/24 @ 1825 BY DRT Performed at [facility], [address] [place], [place] [address]    Culture GRAM POSITIVE COCCI IN CHAINS  Final   Report Status PENDING  Incomplete  Blood Culture ID Panel (Reflexed)     Status: Abnormal   Collection Time: 03/03/24  1:06 AM  Result Value Ref Range Status   Enterococcus faecalis NOT DETECTED NOT DETECTED Final   Enterococcus Faecium NOT DETECTED NOT DETECTED Final   Listeria monocytogenes NOT DETECTED NOT DETECTED Final   Staphylococcus species NOT DETECTED NOT DETECTED Final   Staphylococcus aureus (BCID) NOT DETECTED NOT DETECTED Final   Staphylococcus epidermidis NOT DETECTED NOT DETECTED Final   Staphylococcus lugdunensis NOT DETECTED NOT DETECTED Final   Streptococcus species DETECTED (A) NOT DETECTED Final    Comment: Not Enterococcus species, Streptococcus agalactiae, Streptococcus pyogenes, or Streptococcus pneumoniae. CRITICAL RESULT CALLED TO, READ BACK BY AND VERIFIED WITH: PHARMD MADELINE MITCHELL ON 03/05/24 @ 1825 BY DRT    Streptococcus agalactiae NOT DETECTED NOT DETECTED Final   Streptococcus pneumoniae NOT DETECTED NOT DETECTED Final   Streptococcus pyogenes NOT DETECTED NOT DETECTED Final    A.calcoaceticus-baumannii NOT DETECTED NOT DETECTED Final   Bacteroides fragilis NOT DETECTED NOT DETECTED Final   Enterobacterales NOT DETECTED NOT DETECTED Final   Enterobacter cloacae complex NOT DETECTED NOT DETECTED Final   Escherichia coli NOT DETECTED NOT DETECTED Final   Klebsiella aerogenes NOT DETECTED NOT DETECTED Final   Klebsiella oxytoca NOT DETECTED NOT DETECTED Final   Klebsiella pneumoniae NOT DETECTED NOT DETECTED Final   Proteus species NOT DETECTED  NOT DETECTED Final   Salmonella species NOT DETECTED NOT DETECTED Final   Serratia marcescens NOT DETECTED NOT DETECTED Final   Haemophilus influenzae NOT DETECTED NOT DETECTED Final   Neisseria meningitidis NOT DETECTED NOT DETECTED Final   Pseudomonas aeruginosa NOT DETECTED NOT DETECTED Final   Stenotrophomonas maltophilia NOT DETECTED NOT DETECTED Final   Candida albicans NOT DETECTED NOT DETECTED Final   Candida auris NOT DETECTED NOT DETECTED Final   Candida glabrata NOT DETECTED NOT DETECTED Final   Candida krusei NOT DETECTED NOT DETECTED Final   Candida parapsilosis NOT DETECTED NOT DETECTED Final   Candida tropicalis NOT DETECTED NOT DETECTED Final   Cryptococcus neoformans/gattii NOT DETECTED NOT DETECTED Final    Comment: Performed at [facility], [address] [place], [place] [address]  Resp panel by RT-PCR (RSV, Flu A&B, Covid) Anterior Nasal Swab     Status: None   Collection Time: 03/03/24  1:12 AM   Specimen: Anterior Nasal Swab  Result Value Ref Range Status   SARS Coronavirus 2 by RT PCR NEGATIVE NEGATIVE Final   Influenza A by PCR NEGATIVE NEGATIVE Final   Influenza B by PCR NEGATIVE NEGATIVE Final    Comment: (NOTE) The Xpert Xpress SARS-CoV-2/FLU/RSV plus assay is intended as an aid in the diagnosis of influenza from Nasopharyngeal swab specimens and should not be used as a sole basis for treatment. Nasal washings and aspirates are unacceptable for Xpert Xpress  SARS-CoV-2/FLU/RSV testing.  Fact Sheet for Patients: BloggerCourse.com  Fact Sheet for Healthcare Providers: SeriousBroker.it  This test is not yet approved or cleared by the [place]  FDA and has been authorized for detection and/or diagnosis of SARS-CoV-2 by FDA under an Emergency Use Authorization (EUA). This EUA will remain in effect (meaning this test can be used) for the duration of the COVID-19 declaration under Section 564(b)(1) of the Act, 21 U.S.C. section 360bbb-3(b)(1), unless the authorization is terminated or revoked.     Resp Syncytial Virus by PCR NEGATIVE NEGATIVE Final    Comment: (NOTE) Fact Sheet for Patients: BloggerCourse.com  Fact Sheet for Healthcare Providers: SeriousBroker.it  This test is not yet approved or cleared by the [place]  FDA and has been authorized for detection and/or diagnosis of SARS-CoV-2 by FDA under an Emergency Use Authorization (EUA). This EUA will remain in effect (meaning this test can be used) for the duration of the COVID-19 declaration under Section 564(b)(1) of the Act, 21 U.S.C. section 360bbb-3(b)(1), unless the authorization is terminated or revoked.  Performed at [facility], [address] [place], [place] [address]      Radiology Studies: No results found.   Scheduled Meds:  sodium chloride    Intravenous Once   sodium chloride    Intravenous Once   ferrous sulfate   325 mg Oral Q breakfast   melatonin  5 mg Oral Once   pantoprazole   40 mg Oral Daily   vancomycin   125 mg Oral Q12H   Continuous Infusions:  cefTRIAXone  (ROCEPHIN )  IV 2 g (03/06/24 0922)   metronidazole  500 mg (03/06/24 1006)     LOS: 3 days   Ivonne Mustache, MD [facility] P7/15/2025, 10:37 AM

## 2024-03-06 NOTE — H&P (Signed)
 [facility] History and Physical   Primary Care Physician:  Alvia Bring, DO   Reason for Procedure:  History of cirrhosis, evaluate for esophageal varices  Plan:    Upper endoscopy     HPI: Juan [name] is a 58 y.o. male undergoing upper endoscopy for evaluation of esophageal varices in the setting of cirrhosis.Juan [name] has a history of decompensated alcohol-related cirrhosis admitted with strep parapsilosis bacteremia. He is being evaluated for cardiac vegetations with TEE. Cardiology has requested an EGD before proceeding with TEE to rule out esophageal varices. Last EGD was performed in 2023 without varices at that time. Recent cross-sectional imaging with stigmata of portal hypertension but no definitive esophageal varices.    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 was completely unremarkable.     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  Chronic GERD with HH  09/2019--EGD--[facility], [place]--showed no varices; gr D esophagitis;  Rx: PPI daily     Eustachian tube dysfunction 03/02/2023   Febrile illness 06/27/2023   Fracture of laryngeal cartilage (HCC) 01/17/2020   Last Assessment & Plan:   Formatting of this note might be different from the original.  Emergency department follow-up for evaluation of laryngeal trauma.  CT obtained the night of the injury is reviewed independently and shows nondisplaced fracture of the thyroid  cartilage.  In general, his voice is much improved from the night of the trauma.  Denies any difficulty breathing.  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 07/11/2020   Lower extremity edema 09/06/2022   Lumbar degenerative disc disease 07/26/2022   Malnutrition of moderate degree 01/11/2022   MDD (major depressive disorder), recurrent severe, 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   Nonrheumatic aortic valve stenosis    Normocytic anemia 03/02/2022   Pain of left calf  04/11/2023   Pancytopenia (HCC) 09/18/2021   Portal hypertensive gastropathy (HCC)    Post-traumatic osteoarthritis of right knee 01/04/2023   Prolonged QT interval 05/05/2022   PTSD (post-traumatic stress disorder)    Rib pain on right side 10/31/2022   Right ankle sprain 02/12/2021    Right lower quadrant pain 04/17/2021   Right wrist injury 10/18/2023   Shoulder pain, left, posterior 12/22/2021   Systolic murmur 01/06/2021   Thrombocytopenia (HCC) 11/02/2020   Tibialis posterior tendinitis, right 04/14/2021   Tinea pedis 05/12/2021   Tobacco chew use 03/02/2022   Traumatic hemorrhagic shock (HCC)    Urinary frequency 03/23/2021   Well adult exam 01/06/2021    Past Surgical History:  Procedure Laterality Date   BIOPSY  09/24/2021   Procedure: BIOPSY;  Surgeon: Federico Rosario BROCKS, MD;  Location: [facility] ENDOSCOPY;  Service: Gastroenterology;;   ORIN MEDIATE RELEASE Bilateral    COLONOSCOPY WITH PROPOFOL  N/A 09/24/2021   Procedure: COLONOSCOPY WITH PROPOFOL ;  Surgeon: Federico Rosario BROCKS, MD;  Location: [facility] ENDOSCOPY;  Service: Gastroenterology;  Laterality: N/A;   ESOPHAGOGASTRODUODENOSCOPY (EGD) WITH PROPOFOL  N/A 09/24/2021   Procedure: ESOPHAGOGASTRODUODENOSCOPY (EGD) WITH PROPOFOL ;  Surgeon: Federico Rosario BROCKS, MD;  Location: [facility] ENDOSCOPY;  Service: Gastroenterology;  Laterality: N/A;   ESOPHAGOGASTRODUODENOSCOPY (EGD) WITH PROPOFOL  N/A 08/18/2022   Procedure: ESOPHAGOGASTRODUODENOSCOPY (EGD) WITH PROPOFOL ;  Surgeon: Abran Norleen SAILOR, MD;  Location: [facility] ENDOSCOPY;  Service: Gastroenterology;  Laterality: N/A;   FLEXIBLE SIGMOIDOSCOPY N/A 08/18/2022   Procedure: FLEXIBLE SIGMOIDOSCOPY;  Surgeon: Abran Norleen SAILOR, MD;  Location: [facility]COPY;  Service: Gastroenterology;  Laterality: N/A;   FRACTURE SURGERY     left ankle plate   HERNIA REPAIR     inguinal   KNEE SURGERY Right    x 4   SHOULDER SURGERY Bilateral    x 2   VASECTOMY      Prior to Admission medications   Medication Sig Start Date End Date Taking? Authorizing Provider  calcium  carbonate (TUMS - DOSED IN MG ELEMENTAL CALCIUM ) 500 MG chewable tablet Chew 3 tablets by mouth at bedtime.   Yes [provider]  furosemide  (LASIX ) 40 MG tablet Take 40 mg by mouth daily.   Yes [provider]  lactulose ,  encephalopathy, (CHRONULAC ) 10 GM/15ML SOLN Take 10 g by mouth daily as needed (constipation).   Yes [provider]  linezolid  (ZYVOX ) 600 MG tablet Take 1 tablet (600 mg total) by mouth 2 (two) times daily for 14 days. 03/01/24 03/16/24 Yes Vu, Constance DASEN, MD  ondansetron  (ZOFRAN -ODT) 4 MG disintegrating tablet Take 1 tablet (4 mg total) by mouth every 8 (eight) hours as needed. 02/20/24  Yes Long, Fonda MATSU, MD  QUEtiapine  (SEROQUEL ) 50 MG tablet Take 0.5-1 tablets (25-50 mg total) by mouth at bedtime. Patient taking differently: Take 50 mg by mouth at bedtime. 01/18/24  Yes Alvia Bring, DO  spironolactone  (ALDACTONE ) 100 MG tablet Take 1 tablet (100 mg total) by mouth daily. 02/15/24  Yes Alvia Bring, DO  vancomycin  (VANCOCIN ) 125 MG capsule Take 1 capsule (125 mg total) by mouth in the morning and at bedtime. 03/01/24 04/20/24 Yes Vu, Constance DASEN, MD    Current Facility-Administered Medications  Medication Dose Route Frequency Provider Last Rate Last Admin   [MAR Hold] 0.9 %  sodium chloride  infusion (Manually program via Guardrails IV Fluids)   Intravenous Once Howerter, Justin B, DO       [MAR Hold] 0.9 %  sodium chloride  infusion (Manually program via Guardrails IV Fluids)  Intravenous Once Debby Camila LABOR, MD       0.9 %  sodium chloride  infusion    Continuous PRN Suzann Inocente HERO, MD   250 mL at 03/06/24 0708   [MAR Hold] acetaminophen  (TYLENOL ) tablet 650 mg  650 mg Oral Q6H PRN Debby Camila LABOR, MD       Or   ILDA Hold] acetaminophen  (TYLENOL ) suppository 650 mg  650 mg Rectal Q6H PRN Debby Camila LABOR, MD       [MAR Hold] albuterol  (PROVENTIL ) (2.5 MG/3ML) 0.083% nebulizer solution 2.5 mg  2.5 mg Nebulization Q2H PRN Debby Camila LABOR, MD       [MAR Hold] cefTRIAXone  (ROCEPHIN ) 2 g in sodium chloride  0.9 % 100 mL IVPB  2 g Intravenous Q24H Singh, Mayanka, MD 200 mL/hr at 03/05/24 0848 2 g at 03/05/24 0848   [MAR Hold] ferrous sulfate  tablet 325 mg  325 mg Oral Q breakfast  Jillian Buttery, MD   325 mg at 03/05/24 0841   [MAR Hold] melatonin tablet 5 mg  5 mg Oral Once Chavez, Abigail, NP       [facility] Hold] metroNIDAZOLE  (FLAGYL ) IVPB 500 mg  500 mg Intravenous Q12H Dennise Kingsley, MD 100 mL/hr at 03/05/24 2216 500 mg at 03/05/24 2216   [MAR Hold] pantoprazole  (PROTONIX ) injection 40 mg  40 mg Intravenous Q12H Debby Camila A, MD   40 mg at 03/05/24 2221   [facility] Hold] vancomycin  (VANCOCIN ) capsule 125 mg  125 mg Oral Q12H Dennise Kingsley, MD   125 mg at 03/05/24 2216    Allergies as of 03/03/2024 - Review Complete 03/03/2024  Allergen Reaction Noted   Apple juice Swelling and Other (See Comments) 05/07/2021   Cucumber extract Itching and Nausea And Vomiting 10/16/2013   Depakote er michal sodium er] Swelling and Other (See Comments) 11/10/2017   Depakote [valproic acid] Anaphylaxis 10/16/2013   Peanut butter flavoring agent (non-screening) Anaphylaxis and Swelling 10/16/2013   Peanut oil Swelling 10/16/2013   Peanut-containing drug products Swelling 10/16/2013   Shellfish allergy Itching and Swelling 10/16/2013   Shrimp extract Itching and Swelling 10/16/2013   Apple Swelling 05/04/2022   Cantaloupe extract allergy skin test Rash 12/30/2017   Codeine Itching, Rash, and Other (See Comments) 10/16/2013   Firvanq  [vancomycin ] Rash 12/29/2018   Lactose intolerance (gi) Diarrhea and Other (See Comments)     Family History  Problem Relation Age of Onset   Pulmonary fibrosis Mother    Hypertension Father    Other Father        liver failure   Diabetes Brother    Breast cancer Paternal Aunt    Colon cancer Neg Hx    Esophageal cancer Neg Hx    Stomach cancer Neg Hx    Rectal cancer Neg Hx     Social History   Socioeconomic History   Marital status: Married    Spouse name: [name]   Number of children: 2   Years of education: Not on file   Highest education level: 12th grade  Occupational History    Comment: disability  Tobacco Use    Smoking status: Never   Smokeless tobacco: Current    Types: Snuff  Vaping Use   Vaping status: Never Used  Substance and Sexual Activity   Alcohol use: Not Currently   Drug use: Never   Sexual activity: Yes    Partners: Female  Other Topics Concern   Not on file  Social History Narrative   Lives with wife  Social Drivers of Corporate investment banker Strain: Low Risk  (10/18/2023)   Overall Financial Resource Strain (CARDIA)    Difficulty of Paying Living Expenses: Not hard at all  Food Insecurity: No Food Insecurity (03/04/2024)   Hunger Vital Sign    Worried About Running Out of Food in the Last Year: Never true    Ran Out of Food in the Last Year: Never true  Transportation Needs: No Transportation Needs (03/04/2024)   PRAPARE - Administrator, Civil Service (Medical): No    Lack of Transportation (Non-Medical): No  Physical Activity: Insufficiently Active (10/18/2023)   Exercise Vital Sign    Days of Exercise per Week: 2 days    Minutes of Exercise per Session: 60 min  Stress: Stress Concern Present (01/09/2023)   [employer] of Occupational Health - Occupational Stress Questionnaire    Feeling of Stress : To some extent  Social Connections: Unknown (10/18/2023)   Social Connection and Isolation Panel    Frequency of Communication with Friends and Family: More than three times a week    Frequency of Social Gatherings with Friends and Family: Once a week    Attends Religious Services: More than 4 times per year    Active Member of [employer] or Organizations: Patient declined    Attends Banker Meetings: Patient declined    Marital Status: Married  Catering manager Violence: Not At Risk (03/04/2024)   Humiliation, Afraid, Rape, and Kick questionnaire    Fear of Current or Ex-Partner: No    Emotionally Abused: No    Physically Abused: No    Sexually Abused: No    Review of Systems:  All other review of systems negative except as mentioned in  the HPI.  Physical Exam: Vital signs BP 111/74   Pulse 70   Temp (!) 97.5 F (36.4 C) (Temporal)   Resp 20   Ht 5' 5 (1.651 m)   Wt 86 kg   SpO2 100%   BMI 31.55 kg/m   General:   Alert,  Well-developed, well-nourished, pleasant and cooperative in NAD Lungs:  Clear throughout to auscultation.   Heart:  Regular rate and rhythm; II/VI systolic murmur, clicks, rubs,  or gallops. Abdomen:  Soft, nontender and nondistended. Normal bowel sounds.   Neuro/Psych:  Normal mood and affect. A and O x 3  Inocente Hausen, MD [facility]

## 2024-03-06 NOTE — Progress Notes (Signed)
 Went to place a PICC. Explained to patient the PICC risk and benefits, patient signed consent but preferred to place the PICC line tomorrow. Primary RN made aware.

## 2024-03-06 NOTE — Op Note (Addendum)
 [facility] Patient Name: Juan [name] Procedure Date : 03/06/2024 MRN: [id number] Attending MD: Inocente Hausen , MD, [id number] Date of Birth: 25-Nov-1965 CSN: [id number] Age: 58 Admit Type: Inpatient Procedure:                Upper GI endoscopy Indications:              Cirrhosis rule out esophageal varices prior to TEE Providers:                Inocente Hausen, MD, Clotilda Schmitz, RN, Farris [place]gate,                            Technician Referring MD:              Medicines:                Monitored Anesthesia Care Complications:            No immediate complications. Estimated blood loss:                            None. Estimated Blood Loss:     Estimated blood loss: none. Procedure:                Pre-Anesthesia Assessment:                           - Prior to the procedure, a History and Physical                            was performed, and patient medications and                            allergies were reviewed. The patient's tolerance of                            previous anesthesia was also reviewed. The risks                            and benefits of the procedure and the sedation                            options and risks were discussed with the patient.                            All questions were answered, and informed consent                            was obtained. Prior Anticoagulants: The patient has                            taken no anticoagulant or antiplatelet agents. ASA                            Grade Assessment: III - A patient with severe  systemic disease. After reviewing the risks and                            benefits, the patient was deemed in satisfactory                            condition to undergo the procedure.                           After obtaining informed consent, the endoscope was                            passed under direct vision. Throughout the                            procedure, the  patient's blood pressure, pulse, and                            oxygen  saturations were monitored continuously. The                            [id number]-H190 ([id number] Olympus endoscope was introduced                            through the mouth, and advanced to the second part                            of duodenum. The upper GI endoscopy was                            accomplished without difficulty. The patient                            tolerated the procedure well. Scope In: Scope Out: Findings:      The upper third of the esophagus and middle third of the esophagus were       normal.      LA Grade A (one or more mucosal breaks less than 5 mm, not extending       between tops of 2 mucosal folds) esophagitis with no bleeding was found.      The Z-line was irregular but did not meet criteria for Barrett's       esophagus.      Moderate portal hypertensive gastropathy was found in the entire       examined stomach.      Mild, localized gastric antral vascular ectasia was present in the       prepyloric region of the stomach.      A small hiatal hernia was present.      The duodenal bulb and second portion of the duodenum were normal. Impression:               - Normal upper third of esophagus and middle third                            of esophagus.                           -  LA Grade A esophagitis with no bleeding.                           - Z-line irregular but did not meet criteria for                            Barrett's esophagus.                           - Portal hypertensive gastropathy.                           - Gastric antral vascular ectasia.                           - Small hiatal hernia.                           - Normal duodenal bulb and second portion of the                            duodenum.                           - No specimens collected. Recommendation:           - Return patient to referring hospital for ongoing                            care.                            - No esophageal varices seen today to preclude                            proceeding with TEE                           - In the absence of GI bleeding can change IV PPI                            to pantoprazole  40 mg orally once daily Procedure Code(s):        --- Professional ---                           [id number], Esophagogastroduodenoscopy, flexible,                            transoral; diagnostic, including collection of                            specimen(s) by brushing or washing, when performed                            (separate procedure) Diagnosis Code(s):        --- Professional ---  K20.90, Esophagitis, unspecified without bleeding                           K22.89, Other specified disease of esophagus                           K76.6, Portal hypertension                           K31.89, Other diseases of stomach and duodenum                           K31.819, Angiodysplasia of stomach and duodenum                            without bleeding                           K44.9, Diaphragmatic hernia without obstruction or                            gangrene                           K74.60, Unspecified cirrhosis of liver CPT copyright 2022 American Medical Association. All rights reserved. The codes documented in this report are preliminary and upon coder review may  be revised to meet current compliance requirements. Inocente Hausen, MD 03/06/2024 7:47:12 AM This report has been signed electronically. Number of Addenda: 0

## 2024-03-06 NOTE — Plan of Care (Signed)

## 2024-03-06 NOTE — Transfer of Care (Signed)
 Immediate Anesthesia Transfer of Care Note  Patient: Juan [name]  Procedure(s) Performed: EGD (ESOPHAGOGASTRODUODENOSCOPY)  Patient Location: Endoscopy Unit  Anesthesia Type:MAC  Level of Consciousness: awake and alert   Airway & Oxygen  Therapy: Patient Spontanous Breathing and Patient connected to nasal cannula oxygen   Post-op Assessment: Report given to RN and Post -op Vital signs reviewed and stable  Post vital signs: Reviewed and stable  Last Vitals:  Vitals Value Taken Time  BP 91/52 03/06/24 07:43  Temp 36.3 C 03/06/24 07:43  Pulse 58 03/06/24 07:46  Resp 18 03/06/24 07:46  SpO2 95 % 03/06/24 07:46  Vitals shown include unfiled device data.  Last Pain:  Vitals:   03/06/24 0743  TempSrc: Temporal  PainSc:          Complications: No notable events documented.

## 2024-03-06 NOTE — Anesthesia Preprocedure Evaluation (Addendum)
 Anesthesia Evaluation  Patient identified by MRN, date of birth, ID band Patient awake    Reviewed: Allergy & Precautions, H&P , NPO status , Patient's Chart, lab work & pertinent test results  Airway Mallampati: III  TM Distance: >3 FB Neck ROM: Full    Dental no notable dental hx. (+) Teeth Intact, Dental Advisory Given   Pulmonary neg pulmonary ROS   Pulmonary exam normal breath sounds clear to auscultation       Cardiovascular hypertension, Pt. on medications + Valvular Problems/Murmurs AS  Rhythm:Regular Rate:Normal     Neuro/Psych   Anxiety Depression Bipolar Disorder   negative neurological ROS     GI/Hepatic Neg liver ROS, hiatal hernia,GERD  ,,  Endo/Other  negative endocrine ROS    Renal/GU Renal disease  negative genitourinary   Musculoskeletal  (+) Arthritis , Osteoarthritis,    Abdominal   Peds  Hematology  (+) Blood dyscrasia, anemia   Anesthesia Other Findings   Reproductive/Obstetrics negative OB ROS                              Anesthesia Physical Anesthesia Plan  ASA: 3  Anesthesia Plan: MAC   Post-op Pain Management: Minimal or no pain anticipated   Induction: Intravenous  PONV Risk Score and Plan: 1 and Propofol  infusion  Airway Management Planned: Natural Airway and Simple Face Mask  Additional Equipment:   Intra-op Plan:   Post-operative Plan:   Informed Consent: I have reviewed the patients History and Physical, chart, labs and discussed the procedure including the risks, benefits and alternatives for the proposed anesthesia with the patient or authorized representative who has indicated his/her understanding and acceptance.     Dental advisory given  Plan Discussed with: CRNA  Anesthesia Plan Comments:          Anesthesia Quick Evaluation

## 2024-03-06 NOTE — Anesthesia Postprocedure Evaluation (Signed)
 Anesthesia Post Note  Patient: Juan [name]  Procedure(s) Performed: EGD (ESOPHAGOGASTRODUODENOSCOPY)     Patient location during evaluation: Endoscopy Anesthesia Type: MAC Level of consciousness: awake and alert Pain management: pain level controlled Vital Signs Assessment: post-procedure vital signs reviewed and stable Respiratory status: spontaneous breathing, nonlabored ventilation and respiratory function stable Cardiovascular status: stable and blood pressure returned to baseline Postop Assessment: no apparent nausea or vomiting Anesthetic complications: no   No notable events documented.  Last Vitals:  Vitals:   03/06/24 0750 03/06/24 0758  BP: (!) 95/57 100/69  Pulse: (!) 58 (!) 59  Resp: 17 (!) 22  Temp:    SpO2: 95% 96%    Last Pain:  Vitals:   03/06/24 0758  TempSrc:   PainSc: 0-No pain                 Juan [name],W. EDMOND

## 2024-03-06 NOTE — TOC CM/SW Note (Signed)
 Transition of Care [facility]) - Inpatient Brief Assessment   Patient Details  Name: Juan [name] MRN: [id number] Date of Birth: Mar 21, 1966  [facility] CM/SW Contact:    [name], [name], RN Phone Number: 03/06/2024, 11:21 AM   Clinical Narrative:  Patient presented to the ED with Fever and Chills. Blood cx done outpatient positive for Gram-positive Cocci in pairs. Patient admitted with Bacteremia, on IV and Oral abx. ID following. CT Abdomen showed Acute Gallstone Pancreatitis v/s Cholecystitis. MRCP also suggestive of  Cholecystitis.  General Sx, GI, Cardiology consulted.     From home with wife, has two children. Mother and brother supportive. Currently on disability. Has all necessary DME's at home.  PCP is  Alvia Bring, DO and uses [facility] on [address] in [place].   Patient not Medically ready for discharge.  CM will continue to follow for discharge needs as patient progresses with care towards discharge.                   Transition of Care Asessment: Insurance and Status: Insurance coverage has been reviewed Patient has primary care physician: Yes Home environment has been reviewed: Yes Prior level of function:: Modified Independent Prior/Current Home Services: No current home services Social Drivers of Health Review: SDOH reviewed no interventions necessary Readmission risk has been reviewed: Yes Transition of care needs: no transition of care needs at this time

## 2024-03-06 NOTE — Telephone Encounter (Signed)
 Received voicemail from patient's spouse wanting to know if TEE is happening today as patient is NPO and would like to eat.   Demetrice Amstutz, BSN, RN

## 2024-03-06 NOTE — Plan of Care (Signed)
   Problem: Activity: Goal: Risk for activity intolerance will decrease Outcome: Progressing   Problem: Nutrition: Goal: Adequate nutrition will be maintained Outcome: Progressing   Problem: Coping: Goal: Level of anxiety will decrease Outcome: Progressing   Problem: Elimination: Goal: Will not experience complications related to bowel motility Outcome: Progressing   Problem: Safety: Goal: Ability to remain free from injury will improve Outcome: Progressing

## 2024-03-06 NOTE — Progress Notes (Cosign Needed Addendum)
 [facility]  Date of Admission:  03/03/2024      Total days of antibiotics 3    Ceftriaxone  IV 7/11 >> c   Metronidazole  IV 7/11 >> c   Vancomycin  PO 7/13 >> c        ASSESSMENT: Juan [name] is a 58 y.o. male admitted with:   Strep Parasanguinis Bacteremia -  Fevers -  Leukocytosis -  Abnormal TTE with calcified AV cannot r/o Vegetation -  02/08/24 bcx strep parasanguinis -- PCN susceptible (0.12), Ceftriaxone  (mic </= 0.12) started on PO Cefdinir , repeat cultures 6/30 without growth.  Subjective fevers reported on 7/09 BCx repeated growing Strep parasanguinis Penicillin Intermediate. Now on ceftriaxone  / metronidazole  for treatment. High degree of concern for endocarditis here. Oropantogram negative. No findings of septic emboli on CT or MRI abdomen or brain MRI.   - continue ceftriaxone  / metronidazole   - FU for TEE results   H/O C Difficile Colitis 3x in 2 years -  On PO Vanc 125 mg BID for prevention to continue. Tolerating well so far since switch to PO  - follow for any rash that develops.  - to continue throughout abx use and beyond x 1 week.   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 Some mild congestive gastropathy in stomach but no other acute findings..   Vascular Access -  PICC line ordered   Possible gallstone pancreatitis vs cholecystitis - CT AP showed concern for  acute pancreatitis, possible gallstone pancreatitis vs cholecystitis. MRCP showed possible cholecystitis.  - Gen surgery consulted, no plans for OR  - Currently covered with antimicrobials    PLAN: Continue ceftriaxone  / metronidazole   Continue PO vanc for cdiff prevention  TEE to be done PICC ordered  Standard precautions   Principal Problem:   Bacteremia Active Problems:   Anemia, chronic disease   Abnormal finding on GI tract imaging    sodium chloride    Intravenous Once   sodium chloride    Intravenous Once   ferrous  sulfate  325 mg Oral Q breakfast   melatonin  5 mg Oral Once   pantoprazole   40 mg Oral Daily   vancomycin   125 mg Oral Q12H    SUBJECTIVE: Had EGD today - no varicies. 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   Review of Systems: Review of Systems  Constitutional:  Negative for chills and fever.  Gastrointestinal:  Negative for nausea and vomiting.  Skin:  Negative for itching and rash.    Allergies  Allergen Reactions   Apple Juice Swelling and Other (See Comments)    Tongue swelling   Cucumber Extract Itching and Nausea And Vomiting    No extracts; just cucumber   Depakote Er [Divalproex Sodium Er] Swelling and Other (See Comments)    Tongue swelling   Depakote [Valproic Acid] Anaphylaxis   Peanut Butter Flavoring Agent (Non-Screening) Anaphylaxis and Swelling   Peanut Oil Swelling   Peanut-Containing Drug Products Swelling   Shellfish Allergy Itching and Swelling   Shrimp Extract Itching and Swelling   Apple Swelling   Cantaloupe Extract Allergy Skin Test Rash   Codeine Itching, Rash and Other (See Comments)    Patient reports he can take CODONES without problems   Firvanq  [Vancomycin ] Rash   Lactose Intolerance (Gi) Diarrhea and Other (See Comments)    Indigestion, Stomach pain, Flatulence    OBJECTIVE: Vitals:   03/06/24 9256 03/06/24 0750 03/06/24 0758 03/06/24 0920  BP: (!) 91/52 (!) 95/57 100/69 115/75  Pulse: 64 (!) 58 (!) 59 (!) 58  Resp: (!) 23 17 (!) 22 18  Temp: (!) 97.3 F (36.3 C)   97.6 F (36.4 C)  TempSrc: Temporal   Oral  SpO2: 92% 95% 96% 99%  Weight:      Height:       Body mass index is 31.55 kg/m.  Physical Exam Constitutional:      Appearance: Normal appearance. He is not ill-appearing.     Comments: Seated on side of bed eating meal   Cardiovascular:     Rate and Rhythm: Normal rate.  Abdominal:     General: There is no distension.     Tenderness: There is no abdominal tenderness.  Skin:    General: Skin is warm and dry.      Capillary Refill: Capillary refill takes less than 2 seconds.     Findings: No rash.  Neurological:     Mental Status: He is alert and oriented to person, place, and time.     Lab Results Lab Results  Component Value Date   WBC 11.4 (H) 03/05/2024   HGB 8.7 (L) 03/05/2024   HCT 26.7 (L) 03/05/2024   MCV 83.7 03/05/2024   PLT 73 (L) 03/05/2024    Lab Results  Component Value Date   CREATININE 1.10 03/06/2024   BUN 8 03/06/2024   NA 137 03/06/2024   K 3.6 03/06/2024   CL 107 03/06/2024   CO2 21 (L) 03/06/2024    Lab Results  Component Value Date   ALT 30 03/05/2024   AST 62 (H) 03/05/2024   ALKPHOS 86 03/05/2024   BILITOT 3.6 (H) 03/05/2024     Microbiology: Recent Results (from the past 240 hours)  Blood culture (routine single)     Status: Abnormal   Collection Time: 02/29/24  3:50 PM   Specimen: Blood   VB  Result Value Ref Range Status   BLOOD CULTURE, ROUTINE Final report (A)  Final   Organism ID, Bacteria Comment (A)  Final    Comment: Streptococcus parasanguinis Recovered from aerobic and anaerobic bottles.    Antimicrobial Susceptibility Comment  Final    Comment:       ** S = Susceptible; I = Intermediate; R = Resistant **                    P = Positive; N = Negative             MICS are expressed in micrograms per mL    Antibiotic                 RSLT#1    RSLT#2    RSLT#3    RSLT#4 Cefepime                        S Cefotaxime                     S Ceftriaxone                     S Chloramphenicol                S Clindamycin                    S Erythromycin                    R Levofloxacin  S Penicillin                     I Vancomycin                      S   Blood Culture (routine x 2)     Status: None (Preliminary result)   Collection Time: 03/03/24  1:01 AM   Specimen: BLOOD  Result Value Ref Range Status   Specimen Description BLOOD SITE NOT SPECIFIED  Final   Special Requests   Final    BOTTLES DRAWN AEROBIC AND  ANAEROBIC Blood Culture results may not be optimal due to an inadequate volume of blood received in culture bottles   Culture  Setup Time   Final    GRAM POSITIVE COCCI IN CHAINS AEROBIC BOTTLE ONLY CRITICAL VALUE NOTED.  VALUE IS CONSISTENT WITH PREVIOUSLY REPORTED AND CALLED VALUE. CRITICAL RESULT CALLED TO, READ BACK BY AND VERIFIED WITH: PHARMD EMILY S on [id number] @1110  by SM Performed at [facility], [address], [place], [place] [address]    Culture GRAM POSITIVE COCCI  Final   Report Status PENDING  Incomplete  Blood Culture (routine x 2)     Status: None (Preliminary result)   Collection Time: 03/03/24  1:06 AM   Specimen: BLOOD  Result Value Ref Range Status   Specimen Description BLOOD SITE NOT SPECIFIED  Final   Special Requests   Final    BOTTLES DRAWN AEROBIC AND ANAEROBIC Blood Culture results may not be optimal due to an inadequate volume of blood received in culture bottles   Culture  Setup Time   Final    GRAM POSITIVE COCCI IN CHAINS AEROBIC BOTTLE ONLY CRITICAL RESULT CALLED TO, READ BACK BY AND VERIFIED WITH: PHARMD MADELINE MITCHELL ON 03/05/24 @ 1825 BY DRT Performed at [facility], [address] [place], [place] [address]    Culture GRAM POSITIVE COCCI IN CHAINS  Final   Report Status PENDING  Incomplete  Blood Culture ID Panel (Reflexed)     Status: Abnormal   Collection Time: 03/03/24  1:06 AM  Result Value Ref Range Status   Enterococcus faecalis NOT DETECTED NOT DETECTED Final   Enterococcus Faecium NOT DETECTED NOT DETECTED Final   Listeria monocytogenes NOT DETECTED NOT DETECTED Final   Staphylococcus species NOT DETECTED NOT DETECTED Final   Staphylococcus aureus (BCID) NOT DETECTED NOT DETECTED Final   Staphylococcus epidermidis NOT DETECTED NOT DETECTED Final   Staphylococcus lugdunensis NOT DETECTED NOT DETECTED Final   Streptococcus species DETECTED (A) NOT DETECTED Final    Comment: Not Enterococcus species, Streptococcus  agalactiae, Streptococcus pyogenes, or Streptococcus pneumoniae. CRITICAL RESULT CALLED TO, READ BACK BY AND VERIFIED WITH: PHARMD MADELINE MITCHELL ON 03/05/24 @ 1825 BY DRT    Streptococcus agalactiae NOT DETECTED NOT DETECTED Final   Streptococcus pneumoniae NOT DETECTED NOT DETECTED Final   Streptococcus pyogenes NOT DETECTED NOT DETECTED Final   A.calcoaceticus-baumannii NOT DETECTED NOT DETECTED Final   Bacteroides fragilis NOT DETECTED NOT DETECTED Final   Enterobacterales NOT DETECTED NOT DETECTED Final   Enterobacter cloacae complex NOT DETECTED NOT DETECTED Final   Escherichia coli NOT DETECTED NOT DETECTED Final   Klebsiella aerogenes NOT DETECTED NOT DETECTED Final   Klebsiella oxytoca NOT DETECTED NOT DETECTED Final   Klebsiella pneumoniae NOT DETECTED NOT DETECTED Final   Proteus species NOT DETECTED NOT DETECTED Final   Salmonella species NOT DETECTED NOT DETECTED Final   Serratia  marcescens NOT DETECTED NOT DETECTED Final   Haemophilus influenzae NOT DETECTED NOT DETECTED Final   Neisseria meningitidis NOT DETECTED NOT DETECTED Final   Pseudomonas aeruginosa NOT DETECTED NOT DETECTED Final   Stenotrophomonas maltophilia NOT DETECTED NOT DETECTED Final   Candida albicans NOT DETECTED NOT DETECTED Final   Candida auris NOT DETECTED NOT DETECTED Final   Candida glabrata NOT DETECTED NOT DETECTED Final   Candida krusei NOT DETECTED NOT DETECTED Final   Candida parapsilosis NOT DETECTED NOT DETECTED Final   Candida tropicalis NOT DETECTED NOT DETECTED Final   Cryptococcus neoformans/gattii NOT DETECTED NOT DETECTED Final    Comment: Performed at [facility], [address], [place], [place] [address]  Resp panel by RT-PCR (RSV, Flu A&B, Covid) Anterior Nasal Swab     Status: None   Collection Time: 03/03/24  1:12 AM   Specimen: Anterior Nasal Swab  Result Value Ref Range Status   SARS Coronavirus 2 by RT PCR NEGATIVE NEGATIVE Final   Influenza A by PCR NEGATIVE  NEGATIVE Final   Influenza B by PCR NEGATIVE NEGATIVE Final    Comment: (NOTE) The Xpert Xpress SARS-CoV-2/FLU/RSV plus assay is intended as an aid in the diagnosis of influenza from Nasopharyngeal swab specimens and should not be used as a sole basis for treatment. Nasal washings and aspirates are unacceptable for Xpert Xpress SARS-CoV-2/FLU/RSV testing.  Fact Sheet for Patients: BloggerCourse.com  Fact Sheet for Healthcare Providers: SeriousBroker.it  This test is not yet approved or cleared by the [place]  FDA and has been authorized for detection and/or diagnosis of SARS-CoV-2 by FDA under an Emergency Use Authorization (EUA). This EUA will remain in effect (meaning this test can be used) for the duration of the COVID-19 declaration under Section 564(b)(1) of the Act, 21 U.S.C. section 360bbb-3(b)(1), unless the authorization is terminated or revoked.     Resp Syncytial Virus by PCR NEGATIVE NEGATIVE Final    Comment: (NOTE) Fact Sheet for Patients: BloggerCourse.com  Fact Sheet for Healthcare Providers: SeriousBroker.it  This test is not yet approved or cleared by the [place]  FDA and has been authorized for detection and/or diagnosis of SARS-CoV-2 by FDA under an Emergency Use Authorization (EUA). This EUA will remain in effect (meaning this test can be used) for the duration of the COVID-19 declaration under Section 564(b)(1) of the Act, 21 U.S.C. section 360bbb-3(b)(1), unless the authorization is terminated or revoked.  Performed at [facility], [address] [place], [place] [address]     Corean Fireman, MSN, NP-C [facility] Pager: [phone]  03/06/2024    Total Encounter Time: 8 min

## 2024-03-07 ENCOUNTER — Encounter (HOSPITAL_COMMUNITY): Payer: Self-pay | Admitting: Cardiology

## 2024-03-07 ENCOUNTER — Inpatient Hospital Stay (HOSPITAL_COMMUNITY)

## 2024-03-07 ENCOUNTER — Encounter (HOSPITAL_COMMUNITY)
Admission: EM | Disposition: A | Discharge status: Home Health | Payer: Self-pay | Source: Home / Self Care | Attending: Internal Medicine

## 2024-03-07 ENCOUNTER — Inpatient Hospital Stay (HOSPITAL_COMMUNITY): Admitting: Anesthesiology

## 2024-03-07 ENCOUNTER — Ambulatory Visit: Admitting: Internal Medicine

## 2024-03-07 DIAGNOSIS — I330 Acute and subacute infective endocarditis: Secondary | ICD-10-CM

## 2024-03-07 DIAGNOSIS — I10 Essential (primary) hypertension: Secondary | ICD-10-CM

## 2024-03-07 DIAGNOSIS — R7881 Bacteremia: Secondary | ICD-10-CM | POA: Diagnosis not present

## 2024-03-07 DIAGNOSIS — F418 Other specified anxiety disorders: Secondary | ICD-10-CM

## 2024-03-07 DIAGNOSIS — I081 Rheumatic disorders of both mitral and tricuspid valves: Secondary | ICD-10-CM | POA: Diagnosis not present

## 2024-03-07 DIAGNOSIS — I340 Nonrheumatic mitral (valve) insufficiency: Secondary | ICD-10-CM

## 2024-03-07 HISTORY — DX: Acute and subacute infective endocarditis: I33.0

## 2024-03-07 HISTORY — PX: TRANSESOPHAGEAL ECHOCARDIOGRAM (CATH LAB): EP1270

## 2024-03-07 LAB — BASIC METABOLIC PANEL WITH GFR
Anion gap: 9 (ref 5–15)
BUN: 6 mg/dL (ref 6–20)
CO2: 21 mmol/L — ABNORMAL LOW (ref 22–32)
Calcium: 7 mg/dL — ABNORMAL LOW (ref 8.9–10.3)
Chloride: 107 mmol/L (ref 98–111)
Creatinine, Ser: 0.87 mg/dL (ref 0.61–1.24)
GFR, Estimated: >60 mL/min (ref >60)
Glucose, Bld: 78 mg/dL (ref 70–99)
Potassium: 3.4 mmol/L — ABNORMAL LOW (ref 3.5–5.1)
Sodium: 137 mmol/L (ref 135–145)

## 2024-03-07 LAB — TYPE AND SCREEN
ABO/RH(D): B POS
Antibody Screen: NEGATIVE
Unit division: 0
Unit division: 0

## 2024-03-07 LAB — CULTURE, BLOOD (ROUTINE X 2): Culture  Setup Time: NO GROWTH

## 2024-03-07 LAB — CBC
HCT: 26.1 % — ABNORMAL LOW (ref 39.0–52.0)
Hemoglobin: 8.2 g/dL — ABNORMAL LOW (ref 13.0–17.0)
MCH: 27 pg (ref 26.0–34.0)
MCHC: 31.4 g/dL (ref 30.0–36.0)
MCV: 85.9 fL (ref 80.0–100.0)
Platelets: 72 K/uL — ABNORMAL LOW (ref 150–400)
RBC: 3.04 MIL/uL — ABNORMAL LOW (ref 4.22–5.81)
RDW: 22 % — ABNORMAL HIGH (ref 11.5–15.5)
WBC: 6.5 K/uL (ref 4.0–10.5)
nRBC: 0 % (ref 0.0–0.2)

## 2024-03-07 LAB — BPAM RBC
Blood Product Expiration Date: 202508122359
Blood Product Expiration Date: 202508142359
ISSUE DATE / TIME: 202507120922
Unit Type and Rh: 7300
Unit Type and Rh: 7300

## 2024-03-07 LAB — MAGNESIUM: Magnesium: 1.8 mg/dL (ref 1.7–2.4)

## 2024-03-07 SURGERY — TRANSESOPHAGEAL ECHOCARDIOGRAM (TEE) (CATHLAB)
Anesthesia: Monitor Anesthesia Care

## 2024-03-07 MED ORDER — PROPOFOL 500 MG/50ML IV EMUL
INTRAVENOUS | Status: DC | PRN
Start: 2024-03-07 — End: 2024-03-07
  Administered 2024-03-07: 75 ug/kg/min via INTRAVENOUS

## 2024-03-07 MED ORDER — SODIUM CHLORIDE 0.9% FLUSH
10.0000 mL | INTRAVENOUS | Status: DC | PRN
  Administered 2024-03-07: 10 mL

## 2024-03-07 MED ORDER — POTASSIUM CHLORIDE 10 MEQ/100ML IV SOLN: 10.0000 meq | INTRAVENOUS | Status: AC

## 2024-03-07 MED ORDER — LIDOCAINE HCL (PF) 2 % IJ SOLN: INTRAMUSCULAR | Status: DC | PRN

## 2024-03-07 MED ORDER — DEXMEDETOMIDINE HCL IN NACL 80 MCG/20ML IV SOLN
INTRAVENOUS | Status: AC
  Filled 2024-03-07: qty 20

## 2024-03-07 MED ORDER — SODIUM CHLORIDE 0.9% FLUSH
10.0000 mL | Freq: Two times a day (BID) | INTRAVENOUS | Status: DC
  Administered 2024-03-07 – 2024-03-09 (×4): 10 mL

## 2024-03-07 MED ORDER — DEXMEDETOMIDINE HCL IN NACL 80 MCG/20ML IV SOLN
INTRAVENOUS | Status: DC | PRN
  Administered 2024-03-07: 8 ug via INTRAVENOUS

## 2024-03-07 MED ORDER — LIDOCAINE HCL (PF) 2 % IJ SOLN
INTRAMUSCULAR | Status: DC | PRN
  Administered 2024-03-07: 100 mg via INTRADERMAL

## 2024-03-07 MED ORDER — SODIUM CHLORIDE 0.9 % IV SOLN: INTRAVENOUS | Status: DC | PRN

## 2024-03-07 MED ORDER — CHLORHEXIDINE GLUCONATE CLOTH 2 % EX PADS
6.0000 | MEDICATED_PAD | Freq: Every day | CUTANEOUS | Status: DC
  Administered 2024-03-07 – 2024-03-09 (×3): 6 via TOPICAL

## 2024-03-07 MED ORDER — PROPOFOL 1000 MG/100ML IV EMUL
INTRAVENOUS | Status: AC
  Filled 2024-03-07: qty 100

## 2024-03-07 NOTE — Progress Notes (Signed)
 PROGRESS NOTE  Juan [name]  FMW:[id number] DOB: 1966-07-07 DOA: 03/03/2024 PCP: Alvia Bring, DO   Brief Narrative: Patient is a 58 year old male with past medical history significant for hypertension, cirrhosis with portal hypertension, alcohol abuse, C. difficile GI bleed, PTSD, bipolar disorder, GERD who presented to the emergency department with complaint of fever.  Developed fever in early June on vacation to [place] .  Patient was first seen at [facility] ED on 6/18 .Blood culture sent there came out to be positive for Strepto  para sanguinous and he followed up with his PCP on 6/22, started on cefdinir  and referred him to ID.  Patient was seen in ID clinic on 7/10 and the plan was to do 6 weeks course of IV antibiotics for empiric endocarditis coverage.  Blood cultures sent on 7/9 showed gram-positive cocci and he was called for admission.  Recent history of dental procedure.  History of C. difficile in past 2 years.  He was spiking fevers at home.  On presentation, blood pressure was soft, he was febrile.    Lactate was elevated in the range of 4.  Patient has been admitted for the management of gram-positive bacteremia.  ID following.  CT abdomen showed acute gallstone pancreatitis versus cholecystitis.  MRCP was also suggestive of  cholecystitis .  General surgery, GI consulted.  Hospital course also remarkable for anemia requiring blood transfusion. Underwent EGD today without finding of esophageal varices.  TEE showed possible vegetation in the aortic valve  Assessment & Plan:  Principal Problem:   Gram-positive bacteremia Active Problems:   Anemia, chronic disease   Abnormal finding on GI tract imaging   Acute bacterial endocarditis   Sepsis secondary to gram-positive bacteremia: Presented with fever, lactic acidosis.  Initial WBC count was normal but spiked to 17.3 later.  Blood cultures collected on 7/9 showed gram-positive cocci in pairs/chains.  ID consulted and  following. Currently on ceftriaxone , metronidazole .  Also on oral vancomycin  while on other  antibiotic due to H/O C diff.  Echo showed EF of 60 to 65%, normal right ventricular function.  Echo cundnt rule out atrial valve vegetation, severe thickening of aortic valve, moderate aortic valve stenosis, mild thickening of the mitral valve leaflet.   Normal orthopantogram.  Culture sent here streptococcus mitis/oralis.  Repeat blood cultures have been negative so far. TEE done today showed possible vegetation of the aortic valve. Likely plan is for 6 weeks treatment with IV antibiotics, will check with ID Needs PICC line  Acute gallstone pancreatitis/possible cholecystitis: CT  Imaging showed diffuse inflammatory fat stranding in fluid around pancreas consistent with acute pancreatitis.  Also showed cholelithiasis, gallbladder thickening and trace pericholecystic fluid concerning for cholecystitis, cirrhosis, hyperenhancement of the cecum.  MRCP also suggested cholecystitis.. No abdominal pain, nausea or vomiting.  No history of recent alcohol use.  Exam not consistent with cholecystitis due to lack of pain or nausea.  As per general surgery, he is not a candidate for surgical intervention given his cirrhosis.  General surgery recommended percutaneous cholecystostomy tube by IR if there is ongoing concern for cholecystitis.  Imaging findings could be secondary to portal hypertension and hypoalbuminemia. they recommended management of his gallbladder/liver problem to the transplant center.  General surgery signed off.  MRCP also showed pancreatic lesion of 11X 11 mm cystic lesion in the body of pancreas  History of liver cirrhosis/portal hypertension/alcohol abuse: Following with transplant clinic at [facility].  History of decompensated cirrhosis.  GI following here.  Currently not taking alcohol.  Has trace lower extremity edema which improved with Lasix .  He declines to take Lasix  every day, wants to take as  needed. EGD done on 7/15 did not show any esophageal varices but showed portal hypertensive gastropathy  Acute on chronic normocytic anemia/iron deficiency/thrombocytopenia: This is likely from cirrhosis/portal gastropathy..  Hemoglobin of 6 on presentation.  Given a unit of blood transfusion.  Monitor hemoglobin.  Stable in the range of 8 .  No evidence of acute blood loss.  GI was consulted, no plan for further workup due to lack of evidence of GI bleed.  Started on iron supplementation  Hypokalemia/hypomagnesemia: Supplemented with oral potassium and corrected.  Magnesium  supplemented        DVT prophylaxis:SCDs Start: 03/03/24 0416     Code Status: Full Code  Family Communication: Discussed with wife at bedside on 7/15  Patient status:Inpatient  Patient is from :home  Anticipated discharge un:ynfz  Estimated DC date: After full workup   Consultants: ID, GI, general surgery  Procedures: EGD  Antimicrobials:  Anti-infectives (From admission, onward)    Start     Dose/Rate Route Frequency Ordered Stop   03/04/24 1000  metroNIDAZOLE  (FLAGYL ) IVPB 500 mg        500 mg 100 mL/hr over 60 Minutes Intravenous Every 12 hours 03/04/24 0202     03/04/24 0800  vancomycin  (VANCOCIN ) capsule 125 mg        125 mg Oral Every 12 hours 03/04/24 0148     03/04/24 0800  cefTRIAXone  (ROCEPHIN ) 2 g in sodium chloride  0.9 % 100 mL IVPB        2 g 200 mL/hr over 30 Minutes Intravenous Every 24 hours 03/04/24 0202     03/03/24 2200  vancomycin  (VANCOREADY) IVPB 1500 mg/300 mL  Status:  Discontinued        1,500 mg 150 mL/hr over 120 Minutes Intravenous Every 24 hours 03/03/24 0509 03/04/24 0746   03/03/24 1000  metroNIDAZOLE  (FLAGYL ) IVPB 500 mg  Status:  Discontinued        500 mg 100 mL/hr over 60 Minutes Intravenous Every 12 hours 03/03/24 0722 03/04/24 0148   03/03/24 0600  aztreonam  (AZACTAM ) 2 g in sodium chloride  0.9 % 100 mL IVPB  Status:  Discontinued        2 g 200 mL/hr over  30 Minutes Intravenous Every 8 hours 03/03/24 0445 03/03/24 0508   03/03/24 0600  ceFEPIme  (MAXIPIME ) 2 g in sodium chloride  0.9 % 100 mL IVPB  Status:  Discontinued        2 g 200 mL/hr over 30 Minutes Intravenous Every 8 hours 03/03/24 0509 03/04/24 0148   03/03/24 0115  aztreonam  (AZACTAM ) 2 g in sodium chloride  0.9 % 100 mL IVPB        2 g 200 mL/hr over 30 Minutes Intravenous  Once 03/03/24 0101 03/03/24 0149   03/03/24 0115  metroNIDAZOLE  (FLAGYL ) IVPB 500 mg        500 mg 100 mL/hr over 60 Minutes Intravenous  Once 03/03/24 0101 03/03/24 0244   03/03/24 0115  vancomycin  (VANCOCIN ) IVPB 1000 mg/200 mL premix  Status:  Discontinued        1,000 mg 200 mL/hr over 60 Minutes Intravenous  Once 03/03/24 0101 03/03/24 0104   03/03/24 0115  vancomycin  (VANCOREADY) IVPB 1500 mg/300 mL        1,500 mg 75 mL/hr over 240 Minutes Intravenous  Once 03/03/24 0110 03/03/24 0432       Subjective: Patient seen and  examined at bedside today.  Just came from TEE.  Very comfortable.  Afebrile since he has been admitted.  No new complaints.  Eager to go home  Objective: Vitals:   03/07/24 1130 03/07/24 1135 03/07/24 1140 03/07/24 1145  BP: 106/71 105/66 105/72 111/72  Pulse: 66 66 65 65  Resp: 14 14 10  (!) 8  Temp:  98.3 F (36.8 C)    TempSrc:  Temporal    SpO2: 96% 98% 97% 100%  Weight:      Height:        Intake/Output Summary (Last 24 hours) at 03/07/2024 1316 Last data filed at 03/07/2024 0453 Gross per 24 hour  Intake 300 ml  Output 1050 ml  Net -750 ml   Filed Weights   03/05/24 1832  Weight: 86 kg    Examination:  General exam: Overall comfortable, not in distress HEENT: PERRL Respiratory system:  no wheezes or crackles  Cardiovascular system: S1 & S2 heard, RRR.  Gastrointestinal system: Abdomen is nondistended, soft and nontender. Central nervous system: Alert and oriented Extremities: No edema, no clubbing ,no cyanosis Skin: No rashes, no ulcers,no icterus        Data Reviewed: I have personally reviewed following labs and imaging studies  CBC: Recent Labs  Lab 02/29/24 1544 03/03/24 0101 03/03/24 0505 03/04/24 0903 03/05/24 0637 03/07/24 0557  WBC 10.1 8.5 17.3* 12.6* 11.4* 6.5  NEUTROABS 7.9* 7.2  --   --   --   --   HGB 8.0* 7.6* 6.7* 8.1* 8.7* 8.2*  HCT 24.7* 24.2* 20.8* 25.4* 26.7* 26.1*  MCV 82 85.5 84.9 85.2 83.7 85.9  PLT 66* 57* 56* 59* 73* 72*   Basic Metabolic Panel: Recent Labs  Lab 02/29/24 1544 03/03/24 0101 03/03/24 0505 03/03/24 1148 03/05/24 0637 03/06/24 0638 03/07/24 0557  NA 132*   < > 131* 134* 136 137 137  K 3.7   < > 3.4* 3.5 3.0* 3.6 3.4*  CL 101   < > 106 106 108 107 107  CO2 17*   < > 17* 16* 20* 21* 21*  GLUCOSE 131*   < > 94 95 83 81 78  BUN 16   < > 9 10 7 8 6   CREATININE 1.42*   < > 1.22 1.16 1.04 1.10 0.87  CALCIUM  8.7   < > 8.2* 8.1* 7.5* 7.4* 7.0*  MG 1.2*  --   --   --   --  1.2* 1.8   < > = values in this interval not displayed.     Recent Results (from the past 240 hours)  Blood culture (routine single)     Status: Abnormal   Collection Time: 02/29/24  3:50 PM   Specimen: Blood   VB  Result Value Ref Range Status   BLOOD CULTURE, ROUTINE Final report (A)  Final   Organism ID, Bacteria Comment (A)  Final    Comment: Streptococcus parasanguinis Recovered from aerobic and anaerobic bottles.    Antimicrobial Susceptibility Comment  Final    Comment:       ** S = Susceptible; I = Intermediate; R = Resistant **                    P = Positive; N = Negative             MICS are expressed in micrograms per mL    Antibiotic  RSLT#1    RSLT#2    RSLT#3    RSLT#4 Cefepime                        S Cefotaxime                     S Ceftriaxone                     S Chloramphenicol                S Clindamycin                    S Erythromycin                    R Levofloxacin                   S Penicillin                     I Vancomycin                      S   Blood  Culture (routine x 2)     Status: None (Preliminary result)   Collection Time: 03/03/24  1:01 AM   Specimen: BLOOD  Result Value Ref Range Status   Specimen Description BLOOD SITE NOT SPECIFIED  Final   Special Requests   Final    BOTTLES DRAWN AEROBIC AND ANAEROBIC Blood Culture results may not be optimal due to an inadequate volume of blood received in culture bottles   Culture  Setup Time   Final    GRAM POSITIVE COCCI IN CHAINS IN BOTH AEROBIC AND ANAEROBIC BOTTLES CRITICAL VALUE NOTED.  VALUE IS CONSISTENT WITH PREVIOUSLY REPORTED AND CALLED VALUE. CRITICAL RESULT CALLED TO, READ BACK BY AND VERIFIED WITH: PHARMD EMILY S on [id number] @1110  by SM Performed at [facility], [address], [place], [place] [address]    Culture GRAM POSITIVE COCCI  Final   Report Status PENDING  Incomplete  Blood Culture (routine x 2)     Status: Abnormal   Collection Time: 03/03/24  1:06 AM   Specimen: BLOOD  Result Value Ref Range Status   Specimen Description BLOOD SITE NOT SPECIFIED  Final   Special Requests   Final    BOTTLES DRAWN AEROBIC AND ANAEROBIC Blood Culture results may not be optimal due to an inadequate volume of blood received in culture bottles   Culture  Setup Time   Final    GRAM POSITIVE COCCI IN CHAINS AEROBIC BOTTLE ONLY CRITICAL RESULT CALLED TO, READ BACK BY AND VERIFIED WITH: PHARMD MADELINE MITCHELL ON 03/05/24 @ 1825 BY DRT Performed at [facility], [address] [place], [place] [address]    Culture STREPTOCOCCUS MITIS/ORALIS (A)  Final   Report Status 03/07/2024 FINAL  Final   Organism ID, Bacteria STREPTOCOCCUS MITIS/ORALIS  Final      Susceptibility   Streptococcus mitis/oralis - MIC*    PENICILLIN 0.12 SENSITIVE Sensitive     CEFTRIAXONE  <=0.12 SENSITIVE Sensitive     LEVOFLOXACIN 0.5 SENSITIVE Sensitive     VANCOMYCIN  0.5 SENSITIVE Sensitive     * STREPTOCOCCUS MITIS/ORALIS  Blood Culture ID Panel (Reflexed)     Status: Abnormal   Collection Time:  03/03/24  1:06 AM  Result Value Ref Range Status   Enterococcus faecalis NOT DETECTED NOT DETECTED Final  Enterococcus Faecium NOT DETECTED NOT DETECTED Final   Listeria monocytogenes NOT DETECTED NOT DETECTED Final   Staphylococcus species NOT DETECTED NOT DETECTED Final   Staphylococcus aureus (BCID) NOT DETECTED NOT DETECTED Final   Staphylococcus epidermidis NOT DETECTED NOT DETECTED Final   Staphylococcus lugdunensis NOT DETECTED NOT DETECTED Final   Streptococcus species DETECTED (A) NOT DETECTED Final    Comment: Not Enterococcus species, Streptococcus agalactiae, Streptococcus pyogenes, or Streptococcus pneumoniae. CRITICAL RESULT CALLED TO, READ BACK BY AND VERIFIED WITH: PHARMD MADELINE MITCHELL ON 03/05/24 @ 1825 BY DRT    Streptococcus agalactiae NOT DETECTED NOT DETECTED Final   Streptococcus pneumoniae NOT DETECTED NOT DETECTED Final   Streptococcus pyogenes NOT DETECTED NOT DETECTED Final   A.calcoaceticus-baumannii NOT DETECTED NOT DETECTED Final   Bacteroides fragilis NOT DETECTED NOT DETECTED Final   Enterobacterales NOT DETECTED NOT DETECTED Final   Enterobacter cloacae complex NOT DETECTED NOT DETECTED Final   Escherichia coli NOT DETECTED NOT DETECTED Final   Klebsiella aerogenes NOT DETECTED NOT DETECTED Final   Klebsiella oxytoca NOT DETECTED NOT DETECTED Final   Klebsiella pneumoniae NOT DETECTED NOT DETECTED Final   Proteus species NOT DETECTED NOT DETECTED Final   Salmonella species NOT DETECTED NOT DETECTED Final   Serratia marcescens NOT DETECTED NOT DETECTED Final   Haemophilus influenzae NOT DETECTED NOT DETECTED Final   Neisseria meningitidis NOT DETECTED NOT DETECTED Final   Pseudomonas aeruginosa NOT DETECTED NOT DETECTED Final   Stenotrophomonas maltophilia NOT DETECTED NOT DETECTED Final   Candida albicans NOT DETECTED NOT DETECTED Final   Candida auris NOT DETECTED NOT DETECTED Final   Candida glabrata NOT DETECTED NOT DETECTED Final   Candida  krusei NOT DETECTED NOT DETECTED Final   Candida parapsilosis NOT DETECTED NOT DETECTED Final   Candida tropicalis NOT DETECTED NOT DETECTED Final   Cryptococcus neoformans/gattii NOT DETECTED NOT DETECTED Final    Comment: Performed at [facility], [address], [place], [place] [address]  Resp panel by RT-PCR (RSV, Flu A&B, Covid) Anterior Nasal Swab     Status: None   Collection Time: 03/03/24  1:12 AM   Specimen: Anterior Nasal Swab  Result Value Ref Range Status   SARS Coronavirus 2 by RT PCR NEGATIVE NEGATIVE Final   Influenza A by PCR NEGATIVE NEGATIVE Final   Influenza B by PCR NEGATIVE NEGATIVE Final    Comment: (NOTE) The Xpert Xpress SARS-CoV-2/FLU/RSV plus assay is intended as an aid in the diagnosis of influenza from Nasopharyngeal swab specimens and should not be used as a sole basis for treatment. Nasal washings and aspirates are unacceptable for Xpert Xpress SARS-CoV-2/FLU/RSV testing.  Fact Sheet for Patients: BloggerCourse.com  Fact Sheet for Healthcare Providers: SeriousBroker.it  This test is not yet approved or cleared by the [place]  FDA and has been authorized for detection and/or diagnosis of SARS-CoV-2 by FDA under an Emergency Use Authorization (EUA). This EUA will remain in effect (meaning this test can be used) for the duration of the COVID-19 declaration under Section 564(b)(1) of the Act, 21 U.S.C. section 360bbb-3(b)(1), unless the authorization is terminated or revoked.     Resp Syncytial Virus by PCR NEGATIVE NEGATIVE Final    Comment: (NOTE) Fact Sheet for Patients: BloggerCourse.com  Fact Sheet for Healthcare Providers: SeriousBroker.it  This test is not yet approved or cleared by the [place]  FDA and has been authorized for detection and/or diagnosis of SARS-CoV-2 by FDA under an Emergency Use Authorization (EUA). This EUA  will remain in  effect (meaning this test can be used) for the duration of the COVID-19 declaration under Section 564(b)(1) of the Act, 21 U.S.C. section 360bbb-3(b)(1), unless the authorization is terminated or revoked.  Performed at [facility], [address], [place], [place] [address]   Culture, blood (Routine X 2) w Reflex to ID Panel     Status: None (Preliminary result)   Collection Time: 03/06/24 10:06 AM   Specimen: BLOOD RIGHT ARM  Result Value Ref Range Status   Specimen Description BLOOD RIGHT ARM  Final   Special Requests   Final    BOTTLES DRAWN AEROBIC AND ANAEROBIC Blood Culture adequate volume   Culture   Final    NO GROWTH < 24 HOURS Performed at [facility], [address] [place], [place] [address]    Report Status PENDING  Incomplete  Culture, blood (Routine X 2) w Reflex to ID Panel     Status: None (Preliminary result)   Collection Time: 03/06/24 10:06 AM   Specimen: BLOOD RIGHT HAND  Result Value Ref Range Status   Specimen Description BLOOD RIGHT HAND  Final   Special Requests   Final    BOTTLES DRAWN AEROBIC AND ANAEROBIC Blood Culture adequate volume   Culture   Final    NO GROWTH < 24 HOURS Performed at [facility], [address], [place], [place] [address]    Report Status PENDING  Incomplete     Radiology Studies: EP STUDY Result Date: 03/07/2024 See surgical note for result.  US  EKG SITE RITE Result Date: 03/06/2024 If Site Rite image not attached, placement could not be confirmed due to current cardiac rhythm.    Scheduled Meds:  sodium chloride    Intravenous Once   sodium chloride    Intravenous Once   ferrous sulfate   325 mg Oral Q breakfast   melatonin  5 mg Oral Once   pantoprazole   40 mg Oral Daily   vancomycin   125 mg Oral Q12H   Continuous Infusions:  cefTRIAXone  (ROCEPHIN )  IV 2 g (03/06/24 0922)   metronidazole  500 mg (03/06/24 2133)     LOS: 4 days   Ivonne Mustache, MD [facility] P7/16/2025, 1:16 PM

## 2024-03-07 NOTE — Progress Notes (Signed)
 Peripherally Inserted Central Catheter Placement  The IV Nurse has discussed with the patient and/or persons authorized to consent for the patient, the purpose of this procedure and the potential benefits and risks involved with this procedure.  The benefits include less needle sticks, lab draws from the catheter, and the patient may be discharged home with the catheter. Risks include, but not limited to, infection, bleeding, blood clot (thrombus formation), and puncture of an artery; nerve damage and irregular heartbeat and possibility to perform a PICC exchange if needed/ordered by physician.  Alternatives to this procedure were also discussed.  Bard Power PICC patient education guide, fact sheet on infection prevention and patient information card has been provided to patient /or left at bedside.    PICC Placement Documentation  PICC Single Lumen 03/07/24 Right Cephalic 37 cm 1 cm (Active)  Indication for Insertion or Continuance of Line Home intravenous therapies (PICC only) 03/07/24 1800  Exposed Catheter (cm) 1 cm 03/07/24 1800  Site Assessment Clean, Dry, Intact 03/07/24 1800  Line Status Flushed;Saline locked;Blood return noted 03/07/24 1800  Dressing Type Transparent;Securing device 03/07/24 1800  Dressing Status Antimicrobial disc/dressing in place;Clean, Dry, Intact 03/07/24 1800  Line Care Connections checked and tightened 03/07/24 1800  Line Adjustment (NICU/IV Team Only) No 03/07/24 1800  Dressing Intervention New dressing;Adhesive placed at insertion site (IV team only) 03/07/24 1800  Dressing Change Due 03/14/24 03/07/24 1800       Ethyl Adonna Fuller 03/07/2024, 6:55 PM

## 2024-03-07 NOTE — Plan of Care (Signed)
  Problem: Clinical Measurements: Goal: Will remain free from infection Outcome: Progressing   Problem: Clinical Measurements: Goal: Respiratory complications will improve Outcome: Progressing   Problem: Clinical Measurements: Goal: Cardiovascular complication will be avoided Outcome: Progressing   Problem: Nutrition: Goal: Adequate nutrition will be maintained Outcome: Progressing   Problem: Coping: Goal: Level of anxiety will decrease Outcome: Progressing   Problem: Pain Managment: Goal: General experience of comfort will improve and/or be controlled Outcome: Progressing

## 2024-03-07 NOTE — Progress Notes (Signed)
  Echocardiogram Echocardiogram Transesophageal has been performed.  Juan [name] 03/07/2024, 11:10 AM

## 2024-03-07 NOTE — Transfer of Care (Signed)
 Immediate Anesthesia Transfer of Care Note  Patient: Juan [name]  Procedure(s) Performed: TRANSESOPHAGEAL ECHOCARDIOGRAM  Patient Location: PACU and Cath Lab  Anesthesia Type:MAC  Level of Consciousness: drowsy  Airway & Oxygen  Therapy: Patient connected to nasal cannula oxygen   Post-op Assessment: Report given to RN  Post vital signs: stable  Last Vitals:  Vitals Value Taken Time  BP    Temp    Pulse    Resp    SpO2      Last Pain:  Vitals:   03/07/24 0852  TempSrc: Temporal  PainSc:       Patients Stated Pain Goal: 0 (03/06/24 2135)  Complications: No notable events documented.

## 2024-03-07 NOTE — Anesthesia Preprocedure Evaluation (Signed)
 Anesthesia Evaluation  Patient identified by MRN, date of birth, ID band Patient awake    Reviewed: Allergy & Precautions, NPO status , Patient's Chart, lab work & pertinent test results, reviewed documented beta blocker date and time   History of Anesthesia Complications Negative for: history of anesthetic complications  Airway Mallampati: II  TM Distance: >3 FB     Dental no notable dental hx.    Pulmonary neg COPD   breath sounds clear to auscultation       Cardiovascular hypertension, + Valvular Problems/Murmurs AS  Rhythm:Regular Rate:Normal + Systolic murmurs IMPRESSIONS     1. Left ventricular ejection fraction, by estimation, is 60 to 65%. The  left ventricle has normal function. The left ventricle has no regional  wall motion abnormalities. There is mild left ventricular hypertrophy.  Left ventricular diastolic parameters  were normal.   2. Right ventricular systolic function is normal. The right ventricular  size is normal.   3. The mitral valve is abnormal. Trivial mitral valve regurgitation. No  evidence of mitral stenosis.   4. Compared to 09/14/23 no real change mean gradient 18-16 mmHg peak  31.4->29 mmHg Difficult to r/o vegeatations on a severely calcified AV If  concern for SBE is high can consider TEE. The aortic valve is tricuspid.  There is severe calcifcation of the  aortic valve. There is severe thickening of the aortic valve. Aortic valve  regurgitation is mild. Moderate aortic valve stenosis.   5. The inferior vena cava is normal in size with greater than 50%  respiratory variability, suggesting right atrial pressure of 3 mmHg.      Neuro/Psych  PSYCHIATRIC DISORDERS Anxiety Depression Bipolar Disorder      GI/Hepatic hiatal hernia,GERD  ,,(+) Cirrhosis         Endo/Other    Renal/GU ARFRenal disease     Musculoskeletal  (+) Arthritis ,    Abdominal   Peds  Hematology  (+) Blood  dyscrasia, anemia   Anesthesia Other Findings   Reproductive/Obstetrics                              Anesthesia Physical Anesthesia Plan  ASA: 3  Anesthesia Plan: MAC   Post-op Pain Management:    Induction: Intravenous  PONV Risk Score and Plan: 1 and Ondansetron  and Propofol  infusion  Airway Management Planned: Natural Airway and Nasal Cannula  Additional Equipment:   Intra-op Plan:   Post-operative Plan:   Informed Consent: I have reviewed the patients History and Physical, chart, labs and discussed the procedure including the risks, benefits and alternatives for the proposed anesthesia with the patient or authorized representative who has indicated his/her understanding and acceptance.     Dental advisory given  Plan Discussed with: CRNA  Anesthesia Plan Comments:          Anesthesia Quick Evaluation

## 2024-03-07 NOTE — Interval H&P Note (Signed)
 History and Physical Interval Note:  03/07/2024 10:08 AM  Juan [name]  has presented today for surgery, with the diagnosis of bacteremia.  The various methods of treatment have been discussed with the patient and family. After consideration of risks, benefits and other options for treatment, the patient has consented to  Procedure(s): TRANSESOPHAGEAL ECHOCARDIOGRAM (N/A) as a surgical intervention.  The patient's history has been reviewed, patient examined, no change in status, stable for surgery.  I have reviewed the patient's chart and labs.  Questions were answered to the patient's satisfaction.     Wilbert Bihari

## 2024-03-07 NOTE — Progress Notes (Signed)
 Pt arrived back to 6 north room 18. Wife at bedside. No pain at this time. Bed in lowest position. Call light in reach.

## 2024-03-07 NOTE — Progress Notes (Signed)
 Pt transported to TEE via bed by transportation staff

## 2024-03-07 NOTE — Progress Notes (Signed)
 Report given for TEE. Nurse stated that would get his consent done when he comes down.

## 2024-03-07 NOTE — CV Procedure (Addendum)
     PROCEDURE NOTE:  Procedure:  Transesophageal echocardiogram Operator:  Wilbert Bihari, MD Indications:  gram + bacteremia Complications: None  During this procedure the patient is administered a total of Propofol  185 mg and Lidocaine  100 mg and 8mg  Precedex  to achieve and maintain moderate conscious sedation.  The patient's heart rate, blood pressure, and oxygen  saturation are monitored continuously during the procedure. The period of conscious sedation is 14 minutes, of which I was present face-to-face 100% of this time. Arboriculturist, CRNA is an independent, trained observer who assisted in the monitoring of the patient's level of consciousness.    Results: Normal LV size and function Normal RV size and function Normal RA Normal LA and LA appendage with no thrombus Normal TV with mild TR Normal PV with trivial PR Normal MV with mild MR Trileaflet AV with mild AV sclerosis and no stenosis. 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 There is a shaggy mobile density off the right coronary cusp worrisome for vegetation.  Hypermobile interatrial septum  with no evidence of shunt by colorflow dopper but positive bubble study for PFO after 3 cardiac cycles. Normal thoracic and ascending aorta.  The patient tolerated the procedure well and was transferred back to their room in stable condition.  Signed: Wilbert Bihari, MD [facility]

## 2024-03-07 NOTE — Anesthesia Postprocedure Evaluation (Signed)
 Anesthesia Post Note  Patient: Yuta Cipollone  Procedure(s) Performed: TRANSESOPHAGEAL ECHOCARDIOGRAM     Patient location during evaluation: PACU Anesthesia Type: MAC Level of consciousness: awake and alert Pain management: pain level controlled Vital Signs Assessment: post-procedure vital signs reviewed and stable Respiratory status: spontaneous breathing, nonlabored ventilation, respiratory function stable and patient connected to nasal cannula oxygen  Cardiovascular status: stable and blood pressure returned to baseline Postop Assessment: no apparent nausea or vomiting Anesthetic complications: no   No notable events documented.  Last Vitals:  Vitals:   03/07/24 1140 03/07/24 1145  BP: 105/72 111/72  Pulse: 65 65  Resp: 10 (!) 8  Temp:    SpO2: 97% 100%    Last Pain:  Vitals:   03/07/24 1237  TempSrc:   PainSc: 0-No pain                 Lynwood MARLA Cornea

## 2024-03-08 DIAGNOSIS — I358 Other nonrheumatic aortic valve disorders: Secondary | ICD-10-CM

## 2024-03-08 DIAGNOSIS — K7031 Alcoholic cirrhosis of liver with ascites: Secondary | ICD-10-CM

## 2024-03-08 DIAGNOSIS — R7881 Bacteremia: Secondary | ICD-10-CM | POA: Diagnosis not present

## 2024-03-08 DIAGNOSIS — B954 Other streptococcus as the cause of diseases classified elsewhere: Secondary | ICD-10-CM

## 2024-03-08 DIAGNOSIS — D649 Anemia, unspecified: Secondary | ICD-10-CM

## 2024-03-08 LAB — BASIC METABOLIC PANEL WITH GFR
Anion gap: 7 (ref 5–15)
BUN: 5 mg/dL — ABNORMAL LOW (ref 6–20)
CO2: 19 mmol/L — ABNORMAL LOW (ref 22–32)
Calcium: 6.8 mg/dL — ABNORMAL LOW (ref 8.9–10.3)
Chloride: 109 mmol/L (ref 98–111)
Creatinine, Ser: 0.87 mg/dL (ref 0.61–1.24)
GFR, Estimated: >60 mL/min (ref >60)
Glucose, Bld: 85 mg/dL (ref 70–99)
Potassium: 3.4 mmol/L — ABNORMAL LOW (ref 3.5–5.1)
Sodium: 135 mmol/L (ref 135–145)

## 2024-03-08 LAB — CULTURE, BLOOD (ROUTINE X 2): Culture  Setup Time: NO GROWTH

## 2024-03-08 MED ORDER — QUETIAPINE 12.5 MG HALF TABLET
25.0000 mg | ORAL_TABLET | Freq: Every day | ORAL | Status: DC
  Administered 2024-03-08 (×2): 25 mg via ORAL
  Filled 2024-03-08 (×2): qty 2

## 2024-03-08 MED ORDER — POTASSIUM CHLORIDE CRYS ER 20 MEQ PO TBCR
40.0000 meq | EXTENDED_RELEASE_TABLET | Freq: Once | ORAL | Status: AC
  Administered 2024-03-08: 40 meq via ORAL
  Filled 2024-03-08: qty 2

## 2024-03-08 MED ORDER — CEFTRIAXONE IV (FOR PTA / DISCHARGE USE ONLY): 2.0000 g | INTRAVENOUS | 0 refills | Status: AC

## 2024-03-08 NOTE — Progress Notes (Signed)
 [facility]  Date of Admission:  03/03/2024   Total days of inpatient antibiotics 3  Principal Problem:   Gram-positive bacteremia Active Problems:   Anemia, chronic disease   Abnormal finding on GI tract imaging   Acute bacterial endocarditis          Assessment: 58 year old male with history of alcohol abuse with cirrhosis, strep parapsilosis bacteremia followed by infectious disease, but due to positive blood cultures on 7/9.  #Strep paradanguinis and mitis/oralis bacteremia #History of C. Difficile #Possible gallstone pancreatitis vs cholecysttiis - Patient has a hospitalization at outside facility on 6/18 with blood cultures growing strep para sanguinous he did not stay overnight.  His he was given a dose of IV antibiotics and cefdinir .  He was seen by us  his PCP blood culture 6/30 no growth.  He was having subjective fevers.  Seen in ID clinic 7/10 plan was to do 6 weeks of IV antibiotics for empiric endocarditis coverage.  Blood cultures from 7/9 growing GPC's.  Patient was called back. - Patient notes that prior to last hospitalization on 6/18 he had a dental procedure. -orthopantogram no infection -7/12 blood Cx+ 1/2 strep mitis, 1/2 parasnguinis. Given polymicrobial organisms and negative orthopanto, GI possible source.  - History of C. difficile x 3 in the past 2 years. -CT AP showed concern for  acute pancreatitis, possible gallstone pancreatitis vs cholecystitis. MRCP showed possible cholecystitis. Gneral surgery consulted, no plans for OR -TTE calcified AV, difficult to rule our vegetation --EGD no esophageal varices noted. TEE showed shaggy mobile density on AV, hypermobile septum Recommendations:  -Continue ceftriaxone  and metronidazole . Final abx plan pending repeat negative blood  x3 days(in the setting of recurrent bacteremia), CTS input. PICC is in as plan on prolonged abx.  -PO vaco 125 mg po bid while on abx. No issues with  tolerance -Standard precaution    Evaluation of this patient requires complex antimicrobial therapy evaluation and counseling + isolation needs for disease transmission risk assessment and mitigation    Microbiology:   Antibiotics: Ceftriaxone  and metro Vanco po Cultures: Blood 7/9 strep parasanguinis 7/12 1/2 strep mtis, 1/2 strep parasanguinis 7/15 Urine  Other   SUBJECTIVE: Restin in bed Interval: Afebrile overngiht.   Review of Systems: Review of Systems  All other systems reviewed and are negative.    Scheduled Meds:  sodium chloride    Intravenous Once   sodium chloride    Intravenous Once   Chlorhexidine  Gluconate Cloth  6 each Topical Daily   ferrous sulfate   325 mg Oral Q breakfast   pantoprazole   40 mg Oral Daily   QUEtiapine   25 mg Oral QHS   sodium chloride  flush  10-40 mL Intracatheter Q12H   vancomycin   125 mg Oral Q12H   Continuous Infusions:  cefTRIAXone  (ROCEPHIN )  IV 2 g (03/08/24 0823)   PRN Meds:.acetaminophen  **OR** acetaminophen , albuterol , sodium chloride  flush Allergies  Allergen Reactions   Apple Juice Swelling and Other (See Comments)    Tongue swelling   Cucumber Extract Itching and Nausea And Vomiting    No extracts; just cucumber   Depakote Er [Divalproex Sodium Er] Swelling and Other (See Comments)    Tongue swelling   Depakote [Valproic Acid] Anaphylaxis   Peanut Butter Flavoring Agent (Non-Screening) Anaphylaxis and Swelling   Peanut Oil Swelling   Peanut-Containing Drug Products Swelling   Shellfish Allergy Itching and Swelling   Shrimp Extract Itching and Swelling   Apple Swelling  Cantaloupe Extract Allergy Skin Test Rash   Codeine Itching, Rash and Other (See Comments)    Patient reports he can take CODONES without problems   Firvanq  [Vancomycin ] Rash   Lactose Intolerance (Gi) Diarrhea and Other (See Comments)    Indigestion, Stomach pain, Flatulence    OBJECTIVE: Vitals:   03/07/24 1545 03/07/24 1936  03/08/24 0623 03/08/24 0900  BP: 105/70 103/71 105/63 105/70  Pulse: 70 72 84 84  Resp: 16 16 17 17   Temp: 98.7 F (37.1 C) 98.3 F (36.8 C) 99 F (37.2 C) 98.3 F (36.8 C)  TempSrc: Oral Oral Oral Oral  SpO2: 99% 99% 94% 94%  Weight:      Height:       Body mass index is 31.55 kg/m.  Physical Exam Constitutional:      General: He is not in acute distress.    Appearance: He is normal weight. He is not toxic-appearing.  HENT:     Head: Normocephalic and atraumatic.     Right Ear: External ear normal.     Left Ear: External ear normal.     Nose: No congestion or rhinorrhea.     Mouth/Throat:     Mouth: Mucous membranes are moist.     Pharynx: Oropharynx is clear.  Eyes:     Extraocular Movements: Extraocular movements intact.     Conjunctiva/sclera: Conjunctivae normal.     Pupils: Pupils are equal, round, and reactive to light.  Cardiovascular:     Rate and Rhythm: Normal rate and regular rhythm.     Heart sounds: No murmur heard.    No friction rub. No gallop.  Pulmonary:     Effort: Pulmonary effort is normal.     Breath sounds: Normal breath sounds.  Abdominal:     General: Abdomen is flat. Bowel sounds are normal.     Palpations: Abdomen is soft.  Musculoskeletal:        General: No swelling. Normal range of motion.     Cervical back: Normal range of motion and neck supple.  Skin:    General: Skin is warm and dry.  Neurological:     General: No focal deficit present.     Mental Status: He is oriented to person, place, and time.  Psychiatric:        Mood and Affect: Mood normal.       Lab Results Lab Results  Component Value Date   WBC 6.5 03/07/2024   HGB 8.2 (L) 03/07/2024   HCT 26.1 (L) 03/07/2024   MCV 85.9 03/07/2024   PLT 72 (L) 03/07/2024    Lab Results  Component Value Date   CREATININE 0.87 03/08/2024   BUN 5 (L) 03/08/2024   NA 135 03/08/2024   K 3.4 (L) 03/08/2024   CL 109 03/08/2024   CO2 19 (L) 03/08/2024    Lab Results   Component Value Date   ALT 30 03/05/2024   AST 62 (H) 03/05/2024   ALKPHOS 86 03/05/2024   BILITOT 3.6 (H) 03/05/2024        Loney Stank, MD [facility] 03/08/2024, 3:28 PM

## 2024-03-08 NOTE — Plan of Care (Signed)

## 2024-03-08 NOTE — Progress Notes (Signed)
   03/08/24 1545  Mobility  Activity Ambulated with assistance in hallway  Level of Assistance Contact guard assist, steadying assist  Assistive Device Front wheel walker  Distance Ambulated (ft) 75 ft  Activity Response Tolerated well  Mobility Referral Yes  Mobility visit 1 Mobility  Mobility Specialist Start Time (ACUTE ONLY) 1545  Mobility Specialist Stop Time (ACUTE ONLY) 1555  Mobility Specialist Time Calculation (min) (ACUTE ONLY) 10 min   Mobility Specialist: Progress Note  Pt agreeable to mobility session - received in bed. Pt was asymptomatic throughout session with no complaints. Pt states he does have occasional knee buckling in R knee . Returned to EOB with all needs met - call bell within reach. Wife present.   Virgle Boards, BS Mobility Specialist Please contact via SecureChat or  Rehab office at [phone].

## 2024-03-08 NOTE — Progress Notes (Signed)
 Pt requesting MD to room. Adhikari,MD made aware

## 2024-03-08 NOTE — Progress Notes (Addendum)
 PROGRESS NOTE  Juan [name]  FMW:[id number] DOB: 28-Jul-1966 DOA: 03/03/2024 PCP: Alvia Bring, DO   Brief Narrative: Patient is a 58 year old male with past medical history significant for hypertension, cirrhosis with portal hypertension, alcohol abuse, C. difficile GI bleed, PTSD, bipolar disorder, GERD who presented to the emergency department with complaint of fever.  Developed fever in early June on vacation to [place] .  Patient was first seen at [facility] ED on 6/18 .Blood culture sent there came out to be positive for Strepto  para sanguinous and he followed up with his PCP on 6/22, started on cefdinir  and referred him to ID.  Patient was seen in ID clinic on 7/10 and the plan was to do 6 weeks course of IV antibiotics for empiric endocarditis coverage.  Blood cultures sent on 7/9 showed gram-positive cocci and he was called for admission.  Recent history of dental procedure.  History of C. difficile in past 2 years.  He was spiking fevers at home.  On presentation, blood pressure was soft, he was febrile.    Lactate was elevated in the range of 4.  Patient has been admitted for the management of gram-positive bacteremia.  ID following.  TEE showed possible vegetation in the aortic valve.  Waiting for arrangement of HH and clearance from CT surgery before dc.DC likely tomorrow mrng  Assessment & Plan:  Principal Problem:   Gram-positive bacteremia Active Problems:   Anemia, chronic disease   Abnormal finding on GI tract imaging   Acute bacterial endocarditis   Sepsis secondary to gram-positive bacteremia: Presented with fever, lactic acidosis.  Initial WBC count was normal but spiked to 17.3 later.   ID consulted and following. Currently on ceftriaxone ,  Also on oral vancomycin  while on other  antibiotic due to H/O C diff.  Echo showed EF of 60 to 65%, normal right ventricular function.  Echo cundnt rule out atrial valve vegetation, severe thickening of aortic valve,  moderate aortic valve stenosis, mild thickening of the mitral valve leaflet.   Normal orthopantogram.  Culture sent here streptococcus mitis/oralis.  Repeat blood cultures have been negative so far for last 2 days.  Leukocytosis resolved. TEE showed possible vegetation of the aortic valve. Underwent PICC line on 7/16.  Checking with CT surgery if he needs valve surgery.HH arrangement pending  Acute gallstone pancreatitis/possible cholecystitis: CT  Imaging showed diffuse inflammatory fat stranding in fluid around pancreas consistent with acute pancreatitis.  Also showed cholelithiasis, gallbladder thickening and trace pericholecystic fluid concerning for cholecystitis, cirrhosis, hyperenhancement of the cecum.  MRCP also suggested cholecystitis.. No abdominal pain, nausea or vomiting.  No history of recent alcohol use.  Exam not consistent with cholecystitis due to lack of pain or nausea.  As per general surgery, he is not a candidate for surgical intervention given his cirrhosis.  General surgery recommended percutaneous cholecystostomy tube by IR if there is ongoing concern for cholecystitis.  Imaging findings could be secondary to portal hypertension and hypoalbuminemia. they recommended management of his gallbladder/liver problem to the transplant center.  General surgery signed off.  MRCP also showed pancreatic lesion of 11X 11 mm cystic lesion in the body of pancreas  History of liver cirrhosis/portal hypertension/alcohol abuse: Following with transplant clinic at [facility].  History of decompensated cirrhosis.  GI following here.  Currently not taking alcohol.  Has trace lower extremity edema which improved with Lasix .  He declines to take Lasix  every day, wants to take as needed. EGD done on 7/15 did not show any  esophageal varices but showed portal hypertensive gastropathy  Acute on chronic normocytic anemia/iron deficiency/thrombocytopenia: This is likely from cirrhosis/portal gastropathy..   Hemoglobin of 6 on presentation.  Given a unit of blood transfusion.  Monitor hemoglobin.  Stable in the range of 8 .  No evidence of acute blood loss.  GI was consulted, no plan for further workup due to lack of evidence of GI bleed.  Started on iron supplementation  Hypokalemia/hypomagnesemia: Supplemented with oral potassium .magnesium  stable        DVT prophylaxis:SCDs Start: 03/03/24 0416     Code Status: Full Code  Family Communication: Discussed with wife on phone on 7/17  Patient status:Inpatient  Patient is from :home  Anticipated discharge un:ynfz  Estimated DC date: tomorrow AM. Waiting for [facility] arrangement,CT surgery clearance    Consultants: ID, GI, general surgery  Procedures: EGD,TEE  Antimicrobials:  Anti-infectives (From admission, onward)    Start     Dose/Rate Route Frequency Ordered Stop   03/04/24 1000  metroNIDAZOLE  (FLAGYL ) IVPB 500 mg  Status:  Discontinued        500 mg 100 mL/hr over 60 Minutes Intravenous Every 12 hours 03/04/24 0202 03/08/24 1015   03/04/24 0800  vancomycin  (VANCOCIN ) capsule 125 mg        125 mg Oral Every 12 hours 03/04/24 0148     03/04/24 0800  cefTRIAXone  (ROCEPHIN ) 2 g in sodium chloride  0.9 % 100 mL IVPB        2 g 200 mL/hr over 30 Minutes Intravenous Every 24 hours 03/04/24 0202     03/03/24 2200  vancomycin  (VANCOREADY) IVPB 1500 mg/300 mL  Status:  Discontinued        1,500 mg 150 mL/hr over 120 Minutes Intravenous Every 24 hours 03/03/24 0509 03/04/24 0746   03/03/24 1000  metroNIDAZOLE  (FLAGYL ) IVPB 500 mg  Status:  Discontinued        500 mg 100 mL/hr over 60 Minutes Intravenous Every 12 hours 03/03/24 0722 03/04/24 0148   03/03/24 0600  aztreonam  (AZACTAM ) 2 g in sodium chloride  0.9 % 100 mL IVPB  Status:  Discontinued        2 g 200 mL/hr over 30 Minutes Intravenous Every 8 hours 03/03/24 0445 03/03/24 0508   03/03/24 0600  ceFEPIme  (MAXIPIME ) 2 g in sodium chloride  0.9 % 100 mL IVPB  Status:  Discontinued         2 g 200 mL/hr over 30 Minutes Intravenous Every 8 hours 03/03/24 0509 03/04/24 0148   03/03/24 0115  aztreonam  (AZACTAM ) 2 g in sodium chloride  0.9 % 100 mL IVPB        2 g 200 mL/hr over 30 Minutes Intravenous  Once 03/03/24 0101 03/03/24 0149   03/03/24 0115  metroNIDAZOLE  (FLAGYL ) IVPB 500 mg        500 mg 100 mL/hr over 60 Minutes Intravenous  Once 03/03/24 0101 03/03/24 0244   03/03/24 0115  vancomycin  (VANCOCIN ) IVPB 1000 mg/200 mL premix  Status:  Discontinued        1,000 mg 200 mL/hr over 60 Minutes Intravenous  Once 03/03/24 0101 03/03/24 0104   03/03/24 0115  vancomycin  (VANCOREADY) IVPB 1500 mg/300 mL        1,500 mg 75 mL/hr over 240 Minutes Intravenous  Once 03/03/24 0110 03/03/24 0432       Subjective: Patient seen and examined at bedside today.  Hemodynamically stable.  Comfortable.  Afebrile.  No new complaints today.  Had PICC line placement.  Very  eager to go home.  Objective: Vitals:   03/07/24 1545 03/07/24 1936 03/08/24 0623 03/08/24 0900  BP: 105/70 103/71 105/63 105/70  Pulse: 70 72 84 84  Resp: 16 16 17 17   Temp: 98.7 F (37.1 C) 98.3 F (36.8 C) 99 F (37.2 C) 98.3 F (36.8 C)  TempSrc: Oral Oral Oral Oral  SpO2: 99% 99% 94% 94%  Weight:      Height:        Intake/Output Summary (Last 24 hours) at 03/08/2024 1111 Last data filed at 03/08/2024 9376 Gross per 24 hour  Intake 200 ml  Output 300 ml  Net -100 ml   Filed Weights   03/05/24 1832  Weight: 86 kg    Examination:  General exam: Overall comfortable, not in distress HEENT: PERRL Respiratory system:  no wheezes or crackles  Cardiovascular system: S1 & S2 heard, RRR.  Gastrointestinal system: Abdomen is nondistended, soft and nontender. Central nervous system: Alert and oriented Extremities: No edema, no clubbing ,no cyanosis, PICC line on the left upper extremity Skin: No rashes, no ulcers,no icterus     Data Reviewed: I have personally reviewed following labs and imaging  studies  CBC: Recent Labs  Lab 03/03/24 0101 03/03/24 0505 03/04/24 0903 03/05/24 0637 03/07/24 0557  WBC 8.5 17.3* 12.6* 11.4* 6.5  NEUTROABS 7.2  --   --   --   --   HGB 7.6* 6.7* 8.1* 8.7* 8.2*  HCT 24.2* 20.8* 25.4* 26.7* 26.1*  MCV 85.5 84.9 85.2 83.7 85.9  PLT 57* 56* 59* 73* 72*   Basic Metabolic Panel: Recent Labs  Lab 03/03/24 1148 03/05/24 0637 03/06/24 0638 03/07/24 0557 03/08/24 0633  NA 134* 136 137 137 135  K 3.5 3.0* 3.6 3.4* 3.4*  CL 106 108 107 107 109  CO2 16* 20* 21* 21* 19*  GLUCOSE 95 83 81 78 85  BUN 10 7 8 6  5*  CREATININE 1.16 1.04 1.10 0.87 0.87  CALCIUM  8.1* 7.5* 7.4* 7.0* 6.8*  MG  --   --  1.2* 1.8  --      Recent Results (from the past 240 hours)  Blood culture (routine single)     Status: Abnormal   Collection Time: 02/29/24  3:50 PM   Specimen: Blood   VB  Result Value Ref Range Status   BLOOD CULTURE, ROUTINE Final report (A)  Final   Organism ID, Bacteria Comment (A)  Final    Comment: Streptococcus parasanguinis Recovered from aerobic and anaerobic bottles.    Antimicrobial Susceptibility Comment  Final    Comment:       ** S = Susceptible; I = Intermediate; R = Resistant **                    P = Positive; N = Negative             MICS are expressed in micrograms per mL    Antibiotic                 RSLT#1    RSLT#2    RSLT#3    RSLT#4 Cefepime                        S Cefotaxime                     S Ceftriaxone   S Chloramphenicol                S Clindamycin                    S Erythromycin                    R Levofloxacin                   S Penicillin                     I Vancomycin                      S   Blood Culture (routine x 2)     Status: Abnormal   Collection Time: 03/03/24  1:01 AM   Specimen: BLOOD  Result Value Ref Range Status   Specimen Description BLOOD SITE NOT SPECIFIED  Final   Special Requests   Final    BOTTLES DRAWN AEROBIC AND ANAEROBIC Blood Culture results may not be  optimal due to an inadequate volume of blood received in culture bottles   Culture  Setup Time   Final    GRAM POSITIVE COCCI IN CHAINS IN BOTH AEROBIC AND ANAEROBIC BOTTLES CRITICAL VALUE NOTED.  VALUE IS CONSISTENT WITH PREVIOUSLY REPORTED AND CALLED VALUE. CRITICAL RESULT CALLED TO, READ BACK BY AND VERIFIED WITH: PHARMD EMILY S on [id number] @1110  by SM Performed at [facility], [address], [place], [place] [address]    Culture STREPTOCOCCUS PARASANGUINIS (A)  Final   Report Status 03/08/2024 FINAL  Final   Organism ID, Bacteria STREPTOCOCCUS PARASANGUINIS  Final      Susceptibility   Streptococcus parasanguinis - MIC*    PENICILLIN 0.12 SENSITIVE Sensitive     CEFTRIAXONE  <=0.12 SENSITIVE Sensitive     LEVOFLOXACIN 1 SENSITIVE Sensitive     VANCOMYCIN  0.5 SENSITIVE Sensitive     * STREPTOCOCCUS PARASANGUINIS  Blood Culture (routine x 2)     Status: Abnormal   Collection Time: 03/03/24  1:06 AM   Specimen: BLOOD  Result Value Ref Range Status   Specimen Description BLOOD SITE NOT SPECIFIED  Final   Special Requests   Final    BOTTLES DRAWN AEROBIC AND ANAEROBIC Blood Culture results may not be optimal due to an inadequate volume of blood received in culture bottles   Culture  Setup Time   Final    GRAM POSITIVE COCCI IN CHAINS IN BOTH AEROBIC AND ANAEROBIC BOTTLES CRITICAL RESULT CALLED TO, READ BACK BY AND VERIFIED WITH: PHARMD MADELINE MITCHELL ON 03/05/24 @ 1825 BY DRT Performed at [facility], [address] [place], [place] [address]    Culture STREPTOCOCCUS MITIS/ORALIS (A)  Final   Report Status 03/07/2024 FINAL  Final   Organism ID, Bacteria STREPTOCOCCUS MITIS/ORALIS  Final      Susceptibility   Streptococcus mitis/oralis - MIC*    PENICILLIN 0.12 SENSITIVE Sensitive     CEFTRIAXONE  <=0.12 SENSITIVE Sensitive     LEVOFLOXACIN 0.5 SENSITIVE Sensitive     VANCOMYCIN  0.5 SENSITIVE Sensitive     * STREPTOCOCCUS MITIS/ORALIS  Blood Culture ID Panel  (Reflexed)     Status: Abnormal   Collection Time: 03/03/24  1:06 AM  Result Value Ref Range Status   Enterococcus faecalis NOT DETECTED NOT DETECTED Final   Enterococcus Faecium NOT DETECTED NOT DETECTED Final   Listeria monocytogenes NOT DETECTED NOT DETECTED Final  Staphylococcus species NOT DETECTED NOT DETECTED Final   Staphylococcus aureus (BCID) NOT DETECTED NOT DETECTED Final   Staphylococcus epidermidis NOT DETECTED NOT DETECTED Final   Staphylococcus lugdunensis NOT DETECTED NOT DETECTED Final   Streptococcus species DETECTED (A) NOT DETECTED Final    Comment: Not Enterococcus species, Streptococcus agalactiae, Streptococcus pyogenes, or Streptococcus pneumoniae. CRITICAL RESULT CALLED TO, READ BACK BY AND VERIFIED WITH: PHARMD MADELINE MITCHELL ON 03/05/24 @ 1825 BY DRT    Streptococcus agalactiae NOT DETECTED NOT DETECTED Final   Streptococcus pneumoniae NOT DETECTED NOT DETECTED Final   Streptococcus pyogenes NOT DETECTED NOT DETECTED Final   A.calcoaceticus-baumannii NOT DETECTED NOT DETECTED Final   Bacteroides fragilis NOT DETECTED NOT DETECTED Final   Enterobacterales NOT DETECTED NOT DETECTED Final   Enterobacter cloacae complex NOT DETECTED NOT DETECTED Final   Escherichia coli NOT DETECTED NOT DETECTED Final   Klebsiella aerogenes NOT DETECTED NOT DETECTED Final   Klebsiella oxytoca NOT DETECTED NOT DETECTED Final   Klebsiella pneumoniae NOT DETECTED NOT DETECTED Final   Proteus species NOT DETECTED NOT DETECTED Final   Salmonella species NOT DETECTED NOT DETECTED Final   Serratia marcescens NOT DETECTED NOT DETECTED Final   Haemophilus influenzae NOT DETECTED NOT DETECTED Final   Neisseria meningitidis NOT DETECTED NOT DETECTED Final   Pseudomonas aeruginosa NOT DETECTED NOT DETECTED Final   Stenotrophomonas maltophilia NOT DETECTED NOT DETECTED Final   Candida albicans NOT DETECTED NOT DETECTED Final   Candida auris NOT DETECTED NOT DETECTED Final   Candida  glabrata NOT DETECTED NOT DETECTED Final   Candida krusei NOT DETECTED NOT DETECTED Final   Candida parapsilosis NOT DETECTED NOT DETECTED Final   Candida tropicalis NOT DETECTED NOT DETECTED Final   Cryptococcus neoformans/gattii NOT DETECTED NOT DETECTED Final    Comment: Performed at [facility], [address], [place], [place] [address]  Resp panel by RT-PCR (RSV, Flu A&B, Covid) Anterior Nasal Swab     Status: None   Collection Time: 03/03/24  1:12 AM   Specimen: Anterior Nasal Swab  Result Value Ref Range Status   SARS Coronavirus 2 by RT PCR NEGATIVE NEGATIVE Final   Influenza A by PCR NEGATIVE NEGATIVE Final   Influenza B by PCR NEGATIVE NEGATIVE Final    Comment: (NOTE) The Xpert Xpress SARS-CoV-2/FLU/RSV plus assay is intended as an aid in the diagnosis of influenza from Nasopharyngeal swab specimens and should not be used as a sole basis for treatment. Nasal washings and aspirates are unacceptable for Xpert Xpress SARS-CoV-2/FLU/RSV testing.  Fact Sheet for Patients: BloggerCourse.com  Fact Sheet for Healthcare Providers: SeriousBroker.it  This test is not yet approved or cleared by the [place]  FDA and has been authorized for detection and/or diagnosis of SARS-CoV-2 by FDA under an Emergency Use Authorization (EUA). This EUA will remain in effect (meaning this test can be used) for the duration of the COVID-19 declaration under Section 564(b)(1) of the Act, 21 U.S.C. section 360bbb-3(b)(1), unless the authorization is terminated or revoked.     Resp Syncytial Virus by PCR NEGATIVE NEGATIVE Final    Comment: (NOTE) Fact Sheet for Patients: BloggerCourse.com  Fact Sheet for Healthcare Providers: SeriousBroker.it  This test is not yet approved or cleared by the [place]  FDA and has been authorized for detection and/or diagnosis of SARS-CoV-2 by FDA  under an Emergency Use Authorization (EUA). This EUA will remain in effect (meaning this test can be used) for the duration of the COVID-19 declaration under Section 564(b)(1) of  the Act, 21 U.S.C. section 360bbb-3(b)(1), unless the authorization is terminated or revoked.  Performed at [facility], [address] [place], [place] [address]   Culture, blood (Routine X 2) w Reflex to ID Panel     Status: None (Preliminary result)   Collection Time: 03/06/24 10:06 AM   Specimen: BLOOD RIGHT ARM  Result Value Ref Range Status   Specimen Description BLOOD RIGHT ARM  Final   Special Requests   Final    BOTTLES DRAWN AEROBIC AND ANAEROBIC Blood Culture adequate volume   Culture   Final    NO GROWTH 2 DAYS Performed at [facility], [address], [place], [place] [address]    Report Status PENDING  Incomplete  Culture, blood (Routine X 2) w Reflex to ID Panel     Status: None (Preliminary result)   Collection Time: 03/06/24 10:06 AM   Specimen: BLOOD RIGHT HAND  Result Value Ref Range Status   Specimen Description BLOOD RIGHT HAND  Final   Special Requests   Final    BOTTLES DRAWN AEROBIC AND ANAEROBIC Blood Culture adequate volume   Culture   Final    NO GROWTH 2 DAYS Performed at [facility], [address] [place], [place] [address]    Report Status PENDING  Incomplete     Radiology Studies: EP STUDY Result Date: 03/07/2024 See surgical note for result.  US  EKG SITE RITE Result Date: 03/06/2024 If Site Rite image not attached, placement could not be confirmed due to current cardiac rhythm.    Scheduled Meds:  sodium chloride    Intravenous Once   sodium chloride    Intravenous Once   Chlorhexidine  Gluconate Cloth  6 each Topical Daily   ferrous sulfate   325 mg Oral Q breakfast   pantoprazole   40 mg Oral Daily   QUEtiapine   25 mg Oral QHS   sodium chloride  flush  10-40 mL Intracatheter Q12H   vancomycin   125 mg Oral Q12H   Continuous Infusions:   cefTRIAXone  (ROCEPHIN )  IV 2 g (03/08/24 0823)     LOS: 5 days   Ivonne Mustache, MD [facility] P7/17/2025, 11:11 AM

## 2024-03-08 NOTE — Progress Notes (Signed)
 PHARMACY CONSULT NOTE FOR:  OUTPATIENT  PARENTERAL ANTIBIOTIC THERAPY (OPAT)  Indication: Strep Endocarditis Regimen: Ceftriaxone  2 gm IV Q 24 hours  End date: 04/03/24  IV antibiotic discharge orders are pended. To discharging provider:  please sign these orders via discharge navigator,  Select New Orders & click on the button choice - Manage This Unsigned Work.     Thank you for allowing pharmacy to be a part of this patient's care.  Damien Quiet, PharmD, BCPS, BCIDP Infectious Diseases Clinical Pharmacist Phone: [phone] 03/08/2024, 12:18 PM

## 2024-03-08 NOTE — TOC Progression Note (Addendum)
 Transition of Care [facility]) - Progression Note    Patient Details  Name: Juan [name] MRN: [id number] Date of Birth: May 30, 1966  Transition of Care [facility]) CM/SW Contact  Nola Devere Hands, RN Phone Number: 03/08/2024, 2:58 PM  Clinical Narrative:    Case manager spoke with Holley Herring with [employer] concerning patient. Pam states that as of today we have not received authorization from [place] for patient to receive home antibiotics.CM messaged Dr. Jillian with understanding that patient can not discharge until authorization has been received and RN start time arranged. TOC Team will continue to follow.   1624: CM spoke with patient's wife and discussed delay, spoke with Holley Herring and she has also spoken with patient's wife and received info regarding husband's other insurances. Pam will do education this afternoon and if MD is in agreement patient may discharge home in the morning. CM has updated Dr. Jillian.       Expected Discharge Plan and Services                                               Social Determinants of Health (SDOH) Interventions SDOH Screenings   Food Insecurity: No Food Insecurity (03/04/2024)  Housing: Low Risk  (03/04/2024)  Transportation Needs: No Transportation Needs (03/04/2024)  Utilities: Not At Risk (03/04/2024)  Alcohol Screen: Low Risk  (05/22/2023)  Depression (PHQ2-9): Low Risk  (10/31/2023)  Financial Resource Strain: Low Risk  (10/18/2023)  Physical Activity: Insufficiently Active (10/18/2023)  Social Connections: Unknown (10/18/2023)  Stress: Stress Concern Present (01/09/2023)  Tobacco Use: High Risk (03/06/2024)    Readmission Risk Interventions    03/06/2024   11:20 AM 09/15/2022    9:29 AM 03/04/2022    8:29 AM  Readmission Risk Prevention Plan  Transportation Screening Complete Complete Complete  PCP or Specialist Appt within 5-7 Days   Complete  PCP or Specialist Appt within 3-5 Days Complete Complete   Home Care Screening    Complete  Medication Review (RN CM)   Complete  HRI or Home Care Consult Complete Complete   Social Work Consult for Recovery Care Planning/Counseling Complete Complete   Palliative Care Screening Not Applicable Not Applicable   Medication Review Oceanographer) Referral to Pharmacy Complete

## 2024-03-08 NOTE — Consult Note (Cosign Needed Addendum)
 [address]       [place] [address]             [phone]        Krystle Oberman [facility] Medical Record #[id number] Date of Birth: 1965/11/11  Referring: TH/Infectious Disease Primary Care: Alvia Bring, DO Primary Cardiologist:None  Chief Complaint:    Chief Complaint  Patient presents with   Fever    History of Present Illness:      Juan [name] is a 58 yo male with history of Alcohol Abuse, HTN, Cirrhosis with portal hypertension, PTSD, Bipolar disorder, and C. Difficile Colitis x 3 previously.  He originally presented to an OSH ED on 6/18 while vacationing in [place] with complaints of fevers, chills, fatigues, and malaise.  He was ruled in for sepsis with blood cultures ultimately growing Strep Parasanguinis.  He was discharged on after one dose of IV ABX, fluids, and instructed to continue oral Cefdinir .  He returned to [facility] and was evaluated by his PCP on 6/25 at which time he reported feeling better overall.  He continued to have some fatigue and noted increase in swelling in his lower extremities and abdomen.  At that time he was instructed to continue antibiotics, he was scheduled to follow up with infectious disease.  He was started on Lasix , potassium for volume overloaded state.  He again reported to the ED on 6/30 with complaints of Bilateral lower extremity edema, vomiting, diarrhea, and fever.  Workup at that time consisted of patient's wife admitting to marginal compliance with medication regimen.  Workup showed mild hypokalemia which was supplemented.  He was treated with an IV dose of Rocephin .  Cardiac workup was negative.  It was felt if patient would be compliant with medications he could be managed at home.  He again followed up with his PCP on 7/3 at which time he admitted to feeling back to baseline.  He again presented to [facility] on 7/7 with complaints of vertigo, vomiting, and dark urine. It was felt the patient would  benefit from MRI as his symptoms could possibly be stroke related.  Due to this he was transferred to [facility] for further care.  MRI was obtained and was negative.  His dizziness and nystagmus improved.  It was not felt he patient would require admission and was again discharged from the ED.  He again followed up with PCP on 7/9 at which time with continued issues and low grade fever the provider was concerned about possible vegetation.  Due to this Echocardiogram was ordered.  He was evaluated in the ID clinic on 7/10 at which time the provider had a high suspicion for endocarditis.  He ordered PICC line placement and recommended 6 week course of PCN treatment.  He again presented to the ED on 7/12 after the patient slid out of bed.  While his wife was helping him up she noticed he had a continued fever.  With known medical issues, fever being over 100 and elevated leukocytes it was felt admission to the hospital would be warranted.  Repeat blood cultures were obtained, IV antibiotics were initiated.  He developed a drop in his hemoglobin level and required transfusion of blood cells.  Gastroenterology was consulted and they did not feel he had a GI source of bleeding.  They felt his cirrhosis is well compensated.  He was noted to concerns for cholecystitis on CT scan.  General surgery was consulted and did  not feel patient had evidence of acute issues, nor would he be a surgical candidate in setting of his varices/cirrhosis.  He underwent TEE which showed mild MR, moderate AS, and a vegetation on the Aortic Valve.  Repeat blood cultures were positive for Streptococcus Mitis Species and IV ABX arrangements have been made for home care.  However, cardiothoracic surgery consultation has been requested for AV endocarditis.  Currently the patient is feeling much better.  He states that he is finally getting some improvement with his lower extremity edema.  He denies IV drug use.  He has not consumed alcohol  in 1 month, states he was clean for a year previously but suffered a relapse.  The patient does admit to having a dental procedure around the 1st of June.  He states that he did not receive antibiotics prior to the procedure. He states he will require an implant at that site.   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  Chronic GERD with HH  09/2019--EGD--[facility], [place]--showed no varices; gr D esophagitis;  Rx: PPI daily     Eustachian tube dysfunction 03/02/2023   Febrile illness 06/27/2023   Fracture of laryngeal cartilage (HCC) 01/17/2020   Last Assessment & Plan:   Formatting of this note might be different from the original.  Emergency department follow-up for evaluation of laryngeal trauma.  CT obtained the night of the injury is  reviewed independently and shows nondisplaced fracture of the thyroid  cartilage.  In general, his voice is much improved from the night of the trauma.  Denies any difficulty breathing.  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 07/11/2020   Lower extremity edema 09/06/2022   Lumbar degenerative disc disease 07/26/2022  Malnutrition of moderate degree 01/11/2022   MDD (major depressive disorder), recurrent severe, 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   Nonrheumatic aortic valve stenosis    Normocytic anemia 03/02/2022   Pain of left calf 04/11/2023   Pancytopenia (HCC) 09/18/2021   Portal hypertensive gastropathy (HCC)    Post-traumatic osteoarthritis of right knee 01/04/2023   Prolonged QT interval 05/05/2022   PTSD (post-traumatic stress disorder)    Rib pain on right side 10/31/2022   Right ankle sprain 02/12/2021   Right lower quadrant pain 04/17/2021   Right wrist injury 10/18/2023   Shoulder pain, left, posterior 12/22/2021   Systolic murmur 01/06/2021   Thrombocytopenia (HCC) 11/02/2020   Tibialis posterior tendinitis, right 04/14/2021   Tinea pedis 05/12/2021   Tobacco chew use 03/02/2022   Traumatic hemorrhagic shock (HCC)    Urinary frequency 03/23/2021   Well adult exam 01/06/2021   Past Surgical History:  Procedure Laterality Date   BIOPSY  09/24/2021   Procedure: BIOPSY;   Surgeon: Federico Rosario BROCKS, MD;  Location: [facility] ENDOSCOPY;  Service: Gastroenterology;;   ORIN MEDIATE RELEASE Bilateral    COLONOSCOPY WITH PROPOFOL  N/A 09/24/2021   Procedure: COLONOSCOPY WITH PROPOFOL ;  Surgeon: Federico Rosario BROCKS, MD;  Location: [facility] ENDOSCOPY;  Service: Gastroenterology;  Laterality: N/A;   ESOPHAGOGASTRODUODENOSCOPY N/A 03/06/2024   Procedure: EGD (ESOPHAGOGASTRODUODENOSCOPY);  Surgeon: Suzann Inocente HERO, MD;  Location: [facility] ENDOSCOPY;  Service: Gastroenterology;  Laterality: N/A;   ESOPHAGOGASTRODUODENOSCOPY (EGD) WITH PROPOFOL  N/A 09/24/2021   Procedure: ESOPHAGOGASTRODUODENOSCOPY (EGD) WITH PROPOFOL ;  Surgeon: Federico Rosario BROCKS, MD;  Location: [facility] ENDOSCOPY;  Service: Gastroenterology;  Laterality: N/A;   ESOPHAGOGASTRODUODENOSCOPY (EGD) WITH PROPOFOL  N/A 08/18/2022   Procedure: ESOPHAGOGASTRODUODENOSCOPY (EGD) WITH PROPOFOL ;  Surgeon: Abran Norleen SAILOR, MD;  Location: [facility] ENDOSCOPY;  Service: Gastroenterology;  Laterality: N/A;   FLEXIBLE SIGMOIDOSCOPY N/A 08/18/2022   Procedure: FLEXIBLE SIGMOIDOSCOPY;  Surgeon: Abran Norleen SAILOR, MD;  Location: [facility] ENDOSCOPY;  Service: Gastroenterology;  Laterality: N/A;   FRACTURE SURGERY     left ankle plate   HERNIA REPAIR     inguinal   KNEE SURGERY Right    x 4   SHOULDER SURGERY Bilateral    x 2   TRANSESOPHAGEAL ECHOCARDIOGRAM (CATH LAB) N/A 03/07/2024   Procedure: TRANSESOPHAGEAL ECHOCARDIOGRAM;  Surgeon: Shlomo Wilbert SAUNDERS, MD;  Location: [facility] INVASIVE CV LAB;  Service: Cardiovascular;  Laterality: N/A;   VASECTOMY      Social History   Tobacco Use  Smoking Status Never  Smokeless Tobacco Current   Types: Snuff    Social History   Substance and Sexual Activity  Alcohol Use Not Currently   Allergies  Allergen Reactions   Apple Juice Swelling and Other (See Comments)    Tongue swelling   Cucumber Extract Itching and Nausea And Vomiting    No extracts; just cucumber   Depakote Er [Divalproex Sodium Er] Swelling and Other (See Comments)     Tongue swelling   Depakote [Valproic Acid] Anaphylaxis   Peanut Butter Flavoring Agent (Non-Screening) Anaphylaxis and Swelling   Peanut Oil Swelling   Peanut-Containing Drug Products Swelling   Shellfish Allergy Itching and Swelling   Shrimp Extract Itching and Swelling   Apple Swelling   Cantaloupe Extract Allergy Skin Test Rash   Codeine Itching, Rash and Other (See Comments)    Patient reports he can take CODONES without problems   Firvanq  [Vancomycin ] Rash   Lactose Intolerance (Gi) Diarrhea and Other (See Comments)    Indigestion, Stomach pain, Flatulence   Current Facility-Administered Medications  Medication Dose Route Frequency Provider Last Rate Last Admin   0.9 %  sodium chloride  infusion (Manually program via Guardrails IV Fluids)   Intravenous Once Howerter, Justin B, DO       0.9 %  sodium chloride  infusion (Manually program via Guardrails IV Fluids)   Intravenous Once Debby Camila LABOR, MD       acetaminophen  (TYLENOL ) tablet 650 mg  650 mg Oral Q6H PRN Debby Camila LABOR, MD       Or   acetaminophen  (TYLENOL ) suppository 650 mg  650 mg Rectal Q6H PRN Debby Camila LABOR, MD       albuterol  (PROVENTIL ) (2.5 MG/3ML) 0.083% nebulizer solution 2.5 mg  2.5 mg Nebulization Q2H PRN Debby Camila LABOR, MD       cefTRIAXone  (ROCEPHIN ) 2 g in sodium chloride  0.9 % 100 mL IVPB  2 g Intravenous Q24H Singh, Mayanka, MD 200 mL/hr at 03/08/24 0823 2 g at 03/08/24 9176   Chlorhexidine  Gluconate Cloth 2 % PADS 6 each  6 each Topical Daily Jillian Buttery, MD   6 each at 03/08/24 9176   ferrous sulfate  tablet 325 mg  325 mg Oral Q breakfast Jillian Buttery, MD   325 mg at 03/08/24 9176   pantoprazole  (PROTONIX ) EC tablet 40 mg  40 mg Oral Daily Adhikari, Amrit, MD   40 mg at 03/08/24 0823   QUEtiapine  (SEROQUEL ) tablet 25 mg  25 mg Oral QHS Chavez, Abigail, NP   25 mg at 03/08/24 0019   sodium chloride  flush (NS) 0.9 % injection 10-40 mL  10-40 mL Intracatheter Q12H Jillian Buttery,  MD   10 mL at 03/08/24 9175   sodium chloride  flush (NS) 0.9 % injection 10-40 mL  10-40 mL Intracatheter PRN Jillian Buttery, MD   10 mL at 03/07/24 2153   vancomycin  (VANCOCIN ) capsule 125 mg  125 mg Oral Q12H Dennise Kingsley, MD   125 mg at 03/08/24 9176    Medications Prior to Admission  Medication Sig Dispense Refill Last Dose/Taking   calcium  carbonate (TUMS - DOSED IN MG ELEMENTAL CALCIUM ) 500 MG chewable tablet Chew 3 tablets by mouth at bedtime.   03/01/2024   furosemide  (LASIX ) 40 MG tablet Take 40 mg by mouth daily.   Past Week   lactulose , encephalopathy, (CHRONULAC ) 10 GM/15ML SOLN Take 10 g by mouth daily as needed (constipation).   Past Month   linezolid  (ZYVOX ) 600 MG tablet Take 1 tablet (600 mg total) by mouth 2 (two) times daily for 14 days. 28 tablet 0 03/02/2024 Morning   ondansetron  (ZOFRAN -ODT) 4 MG disintegrating tablet Take 1 tablet (4 mg total) by mouth every 8 (eight) hours as needed. 20 tablet 0 Past Week   QUEtiapine  (SEROQUEL ) 50 MG tablet Take 0.5-1 tablets (25-50 mg total) by mouth at bedtime. (Patient taking differently: Take 50 mg by mouth at bedtime.) 90 tablet 1 03/02/2024 Bedtime   spironolactone  (ALDACTONE ) 100 MG tablet Take 1 tablet (100 mg total) by mouth daily. 90 tablet 1 Past Month   vancomycin  (VANCOCIN ) 125 MG capsule Take 1 capsule (125 mg total) by mouth in the morning and at bedtime. 60 capsule 1 03/02/2024 Morning    Family History  Problem Relation Age of Onset   Pulmonary fibrosis Mother    Hypertension Father    Other Father        liver failure   Diabetes Brother    Breast cancer Paternal Aunt    Colon cancer Neg Hx    Esophageal cancer Neg  Hx    Stomach cancer Neg Hx    Rectal cancer Neg Hx    Review of Systems:   ROS    Cardiac Review of Systems: Y or  [    ]= no  Chest Pain [  N  ]  Resting SOB [ N  ] Exertional SOB  [  ]  Orthopnea [  ]   Pedal Edema [ Y, + pitting  ]    Palpitations [  ] Syncope  [  ]   Presyncope [    ]  General Review of Systems: [Y] = yes [  ]=no Constitional: recent weight change [  ]; anorexia [  ]; fatigue [ Y, resolved ]; nausea [Y, resolved  ]; night sweats [  ]; fever [ Y, resolved ]; or chills [  ]                                                               Dental: Last Dentist visit: early in June, tooth pulled.. will need implant  Eye : blurred vision [  ]; diplopia [   ]; vision changes [  ];  Amaurosis fugax[  ]; Resp: cough [ N ];  wheezing[  ];  hemoptysis[  ]; shortness of breath[  ]; paroxysmal nocturnal dyspnea[  ]; dyspnea on exertion[  ]; or orthopnea[  ];  GI:  gallstones[  ], vomiting[ Y, resolved ];  dysphagia[  ]; melena[  ];  hematochezia [  ]; heartburn[  ];   Hx of  Colonoscopy[  ];  + alcoholic cirrhosis GU: kidney stones [  ]; hematuria[  ];   dysuria [  ];  nocturia[  ];  history of     obstruction [  ]; urinary frequency [  ]             Skin: rash, swelling[ N ];, hair loss[  ];  peripheral edema[ Y, pitting ];  or itching[  ]; Musculosketetal: myalgias[  ];  joint swelling[  ];  joint erythema[  ];  joint pain[  ];  back pain[ Y ];  Heme/Lymph: bruising[  ];  bleeding[N  ];  anemia[ Y ];  Neuro: TIA[  ];  headaches[  ];  stroke[  ];  vertigo[  ];  seizures[  ];   paresthesias[  ];  difficulty walking[  ];  Psych:depression[  ]; anxiety[  ]; + PTSD  Endocrine: diabetes[N  ];  thyroid  dysfunction[  ];  Physical Exam: BP 105/70 (BP Location: Left Arm)   Pulse 84   Temp 98.3 F (36.8 C) (Oral)   Resp 17   Ht 5' 5 (1.651 m)   Wt 86 kg   SpO2 94%   BMI 31.55 kg/m   General appearance: alert, cooperative, and no distress Head: Normocephalic, without obvious abnormality, atraumatic Neck: no adenopathy, no JVD, supple, symmetrical, trachea midline, thyroid  not enlarged, symmetric, no tenderness/mass/nodules, and + bruit, likely radiation from AV Resp: clear to auscultation bilaterally Cardio: regular rate and rhythm and + systolic murmur GI: soft,  distention Extremities: edema 3+ pitting edema Neurologic: Grossly normal  Diagnostic Studies & Laboratory data:     Recent Radiology Findings:   EP STUDY Result Date: 03/07/2024 See surgical note for result.  I have independently reviewed the above radiologic studies and discussed with the patient   Recent Lab Findings: Lab Results  Component Value Date   WBC 6.5 03/07/2024   HGB 8.2 (L) 03/07/2024   HCT 26.1 (L) 03/07/2024   PLT 72 (L) 03/07/2024   GLUCOSE 85 03/08/2024   CHOL 130 05/12/2023   TRIG 61 03/03/2024   HDL 29 (L) 05/12/2023   LDLCALC 84 05/12/2023   ALT 30 03/05/2024   AST 62 (H) 03/05/2024   NA 135 03/08/2024   K 3.4 (L) 03/08/2024   CL 109 03/08/2024   CREATININE 0.87 03/08/2024   BUN 5 (L) 03/08/2024   CO2 19 (L) 03/08/2024   TSH 1.20 04/09/2022   INR 2.4 (H) 03/05/2024   HGBA1C 5.1 04/08/2021   Assessment / Plan:      Aortic Valve Endocarditis- likely started back in June, cultures here on 7/12 remain positive for Strep Mitis.. repeat cultures show NGTD.SABRA. on ABX per ID recommendations H/O Aortic Stenosis-graded moderate on Echocardiogram in January of 2025 Alcoholic Cirrhosis with Ascites- per GI well compensated Anemia Hgb low on admission, improved after 1 unit of backed cells, no evidence of bleeding source, felt to be likely related to Endocarditis  Patient with AV Endocarditis likely developed in June.  He is currently on IV ABX per ID recommendations.SABRA He is afebrile, leukocytosis has resolved, repeat blood cultures show NGTD.  Patient would likely be an increase risk for surgery with underlying alcoholic cirrhosis and varices.  The patient ideally would like to avoid surgery if possible.  Awaiting TEE to be formally read.  Will discuss with Dr. Kerrin to see if surgery is needed at this time or if ABX is all that is indicated.  I  spent 55 minutes counseling the patient face to face.   Rocky Shad, PA-C 03/08/2024 3:21  PM

## 2024-03-09 ENCOUNTER — Other Ambulatory Visit (HOSPITAL_BASED_OUTPATIENT_CLINIC_OR_DEPARTMENT_OTHER): Payer: Self-pay

## 2024-03-09 DIAGNOSIS — F1010 Alcohol abuse, uncomplicated: Secondary | ICD-10-CM

## 2024-03-09 DIAGNOSIS — I330 Acute and subacute infective endocarditis: Secondary | ICD-10-CM

## 2024-03-09 DIAGNOSIS — F4310 Post-traumatic stress disorder, unspecified: Secondary | ICD-10-CM

## 2024-03-09 DIAGNOSIS — K7030 Alcoholic cirrhosis of liver without ascites: Secondary | ICD-10-CM

## 2024-03-09 DIAGNOSIS — I10 Essential (primary) hypertension: Secondary | ICD-10-CM

## 2024-03-09 DIAGNOSIS — F319 Bipolar disorder, unspecified: Secondary | ICD-10-CM

## 2024-03-09 DIAGNOSIS — K766 Portal hypertension: Secondary | ICD-10-CM

## 2024-03-09 DIAGNOSIS — R7881 Bacteremia: Secondary | ICD-10-CM | POA: Diagnosis not present

## 2024-03-09 LAB — BASIC METABOLIC PANEL WITH GFR
Anion gap: 5 (ref 5–15)
BUN: 5 mg/dL — ABNORMAL LOW (ref 6–20)
CO2: 20 mmol/L — ABNORMAL LOW (ref 22–32)
Calcium: 6.7 mg/dL — ABNORMAL LOW (ref 8.9–10.3)
Chloride: 111 mmol/L (ref 98–111)
Creatinine, Ser: 0.82 mg/dL (ref 0.61–1.24)
GFR, Estimated: >60 mL/min (ref >60)
Glucose, Bld: 100 mg/dL — ABNORMAL HIGH (ref 70–99)
Potassium: 3.3 mmol/L — ABNORMAL LOW (ref 3.5–5.1)
Sodium: 136 mmol/L (ref 135–145)

## 2024-03-09 LAB — CBC
HCT: 24.3 % — ABNORMAL LOW (ref 39.0–52.0)
Hemoglobin: 7.7 g/dL — ABNORMAL LOW (ref 13.0–17.0)
MCH: 28 pg (ref 26.0–34.0)
MCHC: 31.7 g/dL (ref 30.0–36.0)
MCV: 88.4 fL (ref 80.0–100.0)
Platelets: 66 K/uL — ABNORMAL LOW (ref 150–400)
RBC: 2.75 MIL/uL — ABNORMAL LOW (ref 4.22–5.81)
RDW: 22.6 % — ABNORMAL HIGH (ref 11.5–15.5)
WBC: 7.1 K/uL (ref 4.0–10.5)
nRBC: 0 % (ref 0.0–0.2)

## 2024-03-09 MED ORDER — HEPARIN SOD (PORK) LOCK FLUSH 100 UNIT/ML IV SOLN
250.0000 [IU] | INTRAVENOUS | Status: AC | PRN
  Administered 2024-03-09: 250 [IU]

## 2024-03-09 MED ORDER — POTASSIUM CHLORIDE 20 MEQ PO PACK
40.0000 meq | PACK | Freq: Once | ORAL | Status: AC
  Administered 2024-03-09: 40 meq via ORAL
  Filled 2024-03-09: qty 2

## 2024-03-09 MED ORDER — FERROUS SULFATE 325 (65 FE) MG PO TABS
325.0000 mg | ORAL_TABLET | Freq: Every day | ORAL | 0 refills | Status: DC
  Filled 2024-03-09: qty 100, 100d supply, fill #0

## 2024-03-09 MED ORDER — PANTOPRAZOLE SODIUM 40 MG PO TBEC
40.0000 mg | DELAYED_RELEASE_TABLET | Freq: Every day | ORAL | 0 refills | Status: DC
  Filled 2024-03-09: qty 30, 30d supply, fill #0

## 2024-03-09 NOTE — Plan of Care (Signed)

## 2024-03-09 NOTE — Plan of Care (Signed)
   Problem: Education: Goal: Knowledge of General Education information will improve Description Including pain rating scale, medication(s)/side effects and non-pharmacologic comfort measures Outcome: Progressing

## 2024-03-09 NOTE — Progress Notes (Signed)
 [facility]  Date of Admission:  (Not on file)   Total days of inpatient antibiotics 3  Active Problems:   * No active hospital problems. *          Assessment: 58 year old male with history of alcohol abuse with cirrhosis, strep parapsilosis bacteremia followed by infectious disease, but due to positive blood cultures on 7/9.  #Strep paradanguinis and mitis/oralis bacteremia #History of C. Difficile #Possible gallstone pancreatitis vs cholecysttiis - Patient has a hospitalization at outside facility on 6/18 with blood cultures growing strep para sanguinous he did not stay overnight.  His he was given a dose of IV antibiotics and cefdinir .  He was seen by us  his PCP blood culture 6/30 no growth.  He was having subjective fevers.  Seen in ID clinic 7/10 plan was to do 6 weeks of IV antibiotics for empiric endocarditis coverage.  Blood cultures from 7/9 growing GPC's.  Patient was called back. - Patient notes that prior to last hospitalization on 6/18 he had a dental procedure. -orthopantogram no infection -7/12 blood Cx+ 1/2 strep mitis, 1/2 parasnguinis. Given polymicrobial organisms and negative orthopanto, GI possible source.  - History of C. difficile x 3 in the past 2 years. -CT AP showed concern for  acute pancreatitis, possible gallstone pancreatitis vs cholecystitis. MRCP showed possible cholecystitis. Gneral surgery consulted, no plans for OR -TTE calcified AV, difficult to rule our vegetation --EGD no esophageal varices noted. TEE showed shaggy mobile density on AV, hypermobile septum Recommendations:  -Continue ceftriaxone  x 4 weeks form negative blood Cx -D/C metronidazole  -CTS no plans for OR -PO vaco 125 mg po bid while on abx(then 7 days out). No issues with tolerance -Has GI f/u pending -communicate to primary -Standard precaution  OPAT ORDERS:  Diagnosis: strep bacteremia Endocarditis    Allergies  Allergen Reactions    Apple Juice Swelling and Other (See Comments)    Tongue swelling   Cucumber Extract Itching and Nausea And Vomiting    No extracts; just cucumber   Depakote Er [Divalproex Sodium Er] Swelling and Other (See Comments)    Tongue swelling   Depakote [Valproic Acid] Anaphylaxis   Peanut Butter Flavoring Agent (Non-Screening) Anaphylaxis and Swelling   Peanut Oil Swelling   Peanut-Containing Drug Products Swelling   Shellfish Allergy Itching and Swelling   Shrimp Extract Itching and Swelling   Apple Swelling   Cantaloupe Extract Allergy Skin Test Rash   Codeine Itching, Rash and Other (See Comments)    Patient reports he can take CODONES without problems   Firvanq  [Vancomycin ] Rash   Lactose Intolerance (Gi) Diarrhea and Other (See Comments)    Indigestion, Stomach pain, Flatulence     Discharge antibiotics to be given via PICC line:  Per pharmacy protocol Ceftriaxone  2 gm IV Q 24 hours    Duration: Ceftriaxone  2 gm IV Q 24 hours  End Date: 8/12  [facility] Per Protocol with [employer] Use: Home health RN for IV administration and teaching, line care and labs.    Labs weekly while on IV antibiotics: _x_ CBC with differential __ BMP **TWICE WEEKLY ON VANCOMYCIN   _x_ CMP _x_ CRP _x_ ESR __ Vancomycin  trough TWICE WEEKLY __ CK  __ Please pull PIC at completion of IV antibiotics x__ Please leave PIC in place until doctor has seen patient or been notified  Fax weekly labs to [phone]  Clinic Follow Up Appt: 7/30  @ [facility] with Dr.  Lakoda Raske    Evaluation of this patient requires complex antimicrobial therapy evaluation and counseling + isolation needs for disease transmission risk assessment and mitigation    Microbiology:   Antibiotics: Ceftriaxone  and metro Vanco po Cultures: Blood 7/9 strep parasanguinis 7/12 1/2 strep mtis, 1/2 strep parasanguinis 7/15 Urine  Other   SUBJECTIVE: Restin in bed Interval: Afebrile overngiht.   Review of  Systems: Review of Systems  All other systems reviewed and are negative.    Scheduled Meds:   Continuous Infusions:   PRN Meds:. Allergies  Allergen Reactions   Apple Juice Swelling and Other (See Comments)    Tongue swelling   Cucumber Extract Itching and Nausea And Vomiting    No extracts; just cucumber   Depakote Er [Divalproex Sodium Er] Swelling and Other (See Comments)    Tongue swelling   Depakote [Valproic Acid] Anaphylaxis   Peanut Butter Flavoring Agent (Non-Screening) Anaphylaxis and Swelling   Peanut Oil Swelling   Peanut-Containing Drug Products Swelling   Shellfish Allergy Itching and Swelling   Shrimp Extract Itching and Swelling   Apple Swelling   Cantaloupe Extract Allergy Skin Test Rash   Codeine Itching, Rash and Other (See Comments)    Patient reports he can take CODONES without problems   Firvanq  [Vancomycin ] Rash   Lactose Intolerance (Gi) Diarrhea and Other (See Comments)    Indigestion, Stomach pain, Flatulence    OBJECTIVE: There were no vitals filed for this visit.  There is no height or weight on file to calculate BMI.  Physical Exam Constitutional:      General: He is not in acute distress.    Appearance: He is normal weight. He is not toxic-appearing.  HENT:     Head: Normocephalic and atraumatic.     Right Ear: External ear normal.     Left Ear: External ear normal.     Nose: No congestion or rhinorrhea.     Mouth/Throat:     Mouth: Mucous membranes are moist.     Pharynx: Oropharynx is clear.  Eyes:     Extraocular Movements: Extraocular movements intact.     Conjunctiva/sclera: Conjunctivae normal.     Pupils: Pupils are equal, round, and reactive to light.  Cardiovascular:     Rate and Rhythm: Normal rate and regular rhythm.     Heart sounds: No murmur heard.    No friction rub. No gallop.  Pulmonary:     Effort: Pulmonary effort is normal.     Breath sounds: Normal breath sounds.  Abdominal:     General: Abdomen is  flat. Bowel sounds are normal.     Palpations: Abdomen is soft.  Musculoskeletal:        General: No swelling. Normal range of motion.     Cervical back: Normal range of motion and neck supple.  Skin:    General: Skin is warm and dry.  Neurological:     General: No focal deficit present.     Mental Status: He is oriented to person, place, and time.  Psychiatric:        Mood and Affect: Mood normal.       Lab Results Lab Results  Component Value Date   WBC 7.1 03/09/2024   HGB 7.7 (L) 03/09/2024   HCT 24.3 (L) 03/09/2024   MCV 88.4 03/09/2024   PLT 66 (L) 03/09/2024    Lab Results  Component Value Date   CREATININE 0.82 03/09/2024   BUN 5 (L) 03/09/2024   NA 136 03/09/2024  K 3.3 (L) 03/09/2024   CL 111 03/09/2024   CO2 20 (L) 03/09/2024    Lab Results  Component Value Date   ALT 30 03/05/2024   AST 62 (H) 03/05/2024   ALKPHOS 86 03/05/2024   BILITOT 3.6 (H) 03/05/2024        Loney Stank, MD [facility] 03/09/2024, 11:38 PM

## 2024-03-09 NOTE — Discharge Summary (Signed)
 Physician Discharge Summary  Juan [name] FMW:[id number] DOB: [birth year] DOA: 03/03/2024  PCP: Alvia Bring, DO  Admit date: 03/03/2024  Discharge date: 03/09/2024  Admitted From: Home  Disposition:  Home.  Recommendations for Outpatient Follow-up:  Follow up with PCP in 1-2 weeks. Please obtain BMP/CBC in one week. Advised to continue IV ceftriaxone  for 6 weeks. Advised to remove PICC line after completion of antibiotics. Advised to follow up ID as scheduled.  Home Health:None Equipment/Devices:PICC line  Discharge Condition: Stable CODE STATUS:Full code Diet recommendation: Heart Healthy   Brief [facility] Course: This 58 year old male with past medical history significant for hypertension, cirrhosis with portal hypertension, alcohol abuse, C. Difficile,  GI bleed, PTSD, bipolar disorder, GERD who presented to the emergency department with complaints of fever.  Developed fever in early June on vacation to [place] .  Patient was first seen at [facility] ED on 02/08/24. Blood culture sent there came out to be positive for Strepto  para sanguinous and he followed up with his PCP on 02/12/24, started on cefdinir  and referred him to ID.  Patient was seen in ID clinic on 7/10 and the plan was to do 6 weeks course of IV antibiotics for empiric endocarditis coverage.  Blood cultures sent on 7/9 showed gram-positive cocci and he was called for admission.  Recent history of dental procedure.  History of C. difficile in past 2 years.  He was spiking fevers at home.  On presentation, blood pressure was soft, he was febrile.  Lactate was elevated in the range of 4.  Patient has been admitted for the management of gram-positive bacteremia.  ID following.  TEE showed possible vegetation in the aortic valve.  Patient would be high risk for surgical intervention. CTSx signed off. Patient wants to be discharged home, Discharged with PICC line, Continue IV ceftriaxone  for 6  weeks.  Discharge Diagnoses:  Principal Problem:   Gram-positive bacteremia Active Problems:   Anemia, chronic disease   Abnormal finding on GI tract imaging   Acute bacterial endocarditis    Sepsis secondary to gram-positive bacteremia:  Presented with fever, lactic acidosis.  Initial WBC count was normal but spiked to 17.3 later.   ID consulted and following. Currently on ceftriaxone ,  Also on oral vancomycin  while on other antibiotic due to H/O C diff.  Echo showed EF of 60 to 65%, normal right ventricular function.  Echo cundnt rule out atrial valve vegetation, severe thickening of aortic valve, moderate aortic valve stenosis, mild thickening of the mitral valve leaflet.   Normal orthopantogram.  Culture sent here streptococcus mitis/oralis.  Repeat blood cultures have been negative so far for last 2 days.  Leukocytosis resolved. TEE showed possible vegetation of the aortic valve. Underwent PICC line on 7/16.  Checking with CT surgery if he needs valve surgery. HH arrangement completed.   Acute gallstone pancreatitis/possible cholecystitis:  CT  Imaging showed diffuse inflammatory fat stranding in fluid around pancreas consistent with acute pancreatitis.  Also showed cholelithiasis, gallbladder thickening and trace pericholecystic fluid concerning for cholecystitis, cirrhosis, hyperenhancement of the cecum.  MRCP also suggested cholecystitis.. No abdominal pain, nausea or vomiting.  No history of recent alcohol use.  Exam not consistent with cholecystitis due to lack of pain or nausea.  As per general surgery, he is not a candidate for surgical intervention given his cirrhosis.  General surgery recommended percutaneous cholecystostomy tube by IR if there is ongoing concern for cholecystitis.  Imaging findings could be secondary to portal hypertension and hypoalbuminemia. they  recommended management of his gallbladder/liver problem to the transplant center.  General surgery signed off.  MRCP  also showed pancreatic lesion of 11X 11 mm cystic lesion in the body of pancreas.   History of liver cirrhosis/portal hypertension/alcohol abuse:  Following with transplant clinic at [facility].  History of decompensated cirrhosis.  GI following here.  Currently not taking alcohol.  Has trace lower extremity edema which improved with Lasix .  He declines to take Lasix  every day, wants to take as needed. EGD done on 7/15 did not show any esophageal varices but showed portal hypertensive gastropathy   Acute on chronic normocytic anemia/iron deficiency/thrombocytopenia: This is likely from cirrhosis/portal gastropathy..  Hemoglobin of 6 on presentation.  Given a unit of blood transfusion.  Monitor hemoglobin.  Stable in the range of 8 .  No evidence of acute blood loss.  GI was consulted, no plan for further workup due to lack of evidence of GI bleed.  Started on iron supplementation   Hypokalemia/hypomagnesemia: Supplemented with oral potassium .magnesium  stable  Discharge Instructions  Discharge Instructions     [facility] pharmacist to adjust dose for Vancomycin , Aminoglycosides and other anti-infective therapies as requested by physician.   Complete by: As directed    [facility] to provide Cath Flo 2mg    Complete by: As directed    Administer for PICC line occlusion and as ordered by physician for other access device issues.   Anaphylaxis Kit: Provided to treat any anaphylactic reaction to the medication being provided to the patient if First Dose or when requested by physician   Complete by: As directed    Epinephrine  1mg /ml vial / amp: Administer 0.3mg  (0.8ml) subcutaneously once for moderate to severe anaphylaxis, nurse to call physician and pharmacy when reaction occurs and call 911 if needed for immediate care   Diphenhydramine  50mg /ml IV vial: Administer 25-50mg  IV/IM PRN for first dose reaction, rash, itching, mild reaction, nurse to call physician and pharmacy when  reaction occurs   Sodium Chloride  0.9% NS 500ml IV: Administer if needed for hypovolemic blood pressure drop or as ordered by physician after call to physician with anaphylactic reaction   Call MD for:  persistant dizziness or light-headedness   Complete by: As directed    Call MD for:  redness, tenderness, or signs of infection (pain, swelling, redness, odor or green/yellow discharge around incision site)   Complete by: As directed    Call MD for:  temperature >100.4   Complete by: As directed    Change dressing on IV access line weekly and PRN   Complete by: As directed    Diet - low sodium heart healthy   Complete by: As directed    Diet general   Complete by: As directed    Discharge instructions   Complete by: As directed    Advised to follow up PCP in one week. Advised to continue ceftriaxone  for 4 weeks. Advised to remove PICC line after completion of antibiotics. Advised to follow up ID as scheduled.   Discharge wound care:   Complete by: As directed    Follow up outpatient Pcp.   Flush IV access with Sodium Chloride  0.9% and Heparin 10 units/ml or 100 units/ml   Complete by: As directed    Home infusion instructions - [facility]   Complete by: As directed    Instructions: Flush IV access with Sodium Chloride  0.9% and Heparin 10units/ml or 100units/ml   Change dressing on IV access line: Weekly and PRN  Instructions Cath Flo 2mg : Administer for PICC Line occlusion and as ordered by physician for other access device   [facility] pharmacist to adjust dose for: Vancomycin , Aminoglycosides and other anti-infective therapies as requested by physician   Increase activity slowly   Complete by: As directed    Method of administration may be changed at the discretion of home infusion pharmacist based upon assessment of the patient and/or caregiver's ability to self-administer the medication ordered   Complete by: As directed       Allergies as of  03/09/2024       Reactions   Apple Juice Swelling, Other (See Comments)   Tongue swelling   Cucumber Extract Itching, Nausea And Vomiting   No extracts; just cucumber   Depakote Er [divalproex Sodium Er] Swelling, Other (See Comments)   Tongue swelling   Depakote [valproic Acid] Anaphylaxis   Peanut Butter Flavoring Agent (non-screening) Anaphylaxis, Swelling   Peanut Oil Swelling   Peanut-containing Drug Products Swelling   Shellfish Allergy Itching, Swelling   Shrimp Extract Itching, Swelling   Apple Swelling   Cantaloupe Extract Allergy Skin Test Rash   Codeine Itching, Rash, Other (See Comments)   Patient reports he can take CODONES without problems   Firvanq  [vancomycin ] Rash   Lactose Intolerance (gi) Diarrhea, Other (See Comments)   Indigestion, Stomach pain, Flatulence        Medication List     STOP taking these medications    linezolid  600 MG tablet Commonly known as: ZYVOX        TAKE these medications    calcium  carbonate 500 MG chewable tablet Commonly known as: TUMS - dosed in mg elemental calcium  Chew 3 tablets by mouth at bedtime.   cefTRIAXone  IVPB Commonly known as: ROCEPHIN  Inject 2 g into the vein daily for 26 days. Indication:  Strep endocarditis First Dose: Yes Last Day of Therapy:  04/03/24 Labs - Once weekly:  CBC/D and BMP, Labs - Once weekly: ESR and CRP Method of administration: IV Push Method of administration may be changed at the discretion of home infusion pharmacist based upon assessment of the patient and/or caregiver's ability to self-administer the medication ordered.   FeroSul 325 (65 Fe) MG tablet Generic drug: ferrous sulfate  Take 1 tablet (325 mg total) by mouth daily with breakfast. Start taking on: March 10, 2024   furosemide  40 MG tablet Commonly known as: LASIX  Take 40 mg by mouth daily.   lactulose  (encephalopathy) 10 GM/15ML Soln Commonly known as: CHRONULAC  Take 10 g by mouth daily as needed (constipation).    ondansetron  4 MG disintegrating tablet Commonly known as: ZOFRAN -ODT Take 1 tablet (4 mg total) by mouth every 8 (eight) hours as needed.   pantoprazole  40 MG tablet Commonly known as: PROTONIX  Take 1 tablet (40 mg total) by mouth daily. Start taking on: March 10, 2024   QUEtiapine  50 MG tablet Commonly known as: SEROquel  Take 0.5-1 tablets (25-50 mg total) by mouth at bedtime. What changed: how much to take   spironolactone  100 MG tablet Commonly known as: ALDACTONE  Take 1 tablet (100 mg total) by mouth daily.   vancomycin  125 MG capsule Commonly known as: VANCOCIN  Take 1 capsule (125 mg total) by mouth in the morning and at bedtime.               Discharge Care Instructions  (From admission, onward)           Start     Ordered   03/09/24 0000  Discharge wound care:       Comments: Follow up outpatient Pcp.   03/09/24 1349   03/08/24 0000  Change dressing on IV access line weekly and PRN  (Home infusion instructions - [facility] )        03/08/24 1219            Follow-up Information     Alvia Bring, DO Follow up in 1 week(s).   Specialty: Family Medicine Contact information: [address]  [address] [place] [address] [phone]                Allergies  Allergen Reactions   Apple Juice Swelling and Other (See Comments)    Tongue swelling   Cucumber Extract Itching and Nausea And Vomiting    No extracts; just cucumber   Depakote Er [Divalproex Sodium Er] Swelling and Other (See Comments)    Tongue swelling   Depakote [Valproic Acid] Anaphylaxis   Peanut Butter Flavoring Agent (Non-Screening) Anaphylaxis and Swelling   Peanut Oil Swelling   Peanut-Containing Drug Products Swelling   Shellfish Allergy Itching and Swelling   Shrimp Extract Itching and Swelling   Apple Swelling   Cantaloupe Extract Allergy Skin Test Rash   Codeine Itching, Rash and Other (See Comments)    Patient reports he can take  CODONES without problems   Firvanq  [Vancomycin ] Rash   Lactose Intolerance (Gi) Diarrhea and Other (See Comments)    Indigestion, Stomach pain, Flatulence    Consultations: ID CT sx   Procedures/Studies: EP STUDY Result Date: 03/07/2024 See surgical note for result.  US  EKG SITE RITE Result Date: 03/06/2024 If Site Rite image not attached, placement could not be confirmed due to current cardiac rhythm.  DG Orthopantogram Result Date: 03/04/2024 EXAM: PAMALEE SPRINKLE, 1 VIEW 03/04/2024 08:03:00 AM TECHNIQUE: Panorex view of the mandible. COMPARISON: MR head without contrast 02/27/2024. CLINICAL HISTORY: 01364 Infection [id number]. Infection FINDINGS: DENTAL: Multiple dental fillings are in place. Root canal is present in the left second maxillary incisor. No focal dental caries or periapical lucency is present. BONES: No acute or healing fracture is present. TMJ: No dislocation. SOFT TISSUES: The soft tissues are unremarkable. IMPRESSION: 1. No acute osseous abnormality. 2. No evidence of infection. Electronically signed by: Lonni Necessary MD 03/04/2024 11:51 AM EDT RP Workstation: [id number]   ECHOCARDIOGRAM COMPLETE Result Date: 03/03/2024    ECHOCARDIOGRAM REPORT   Patient Name:   Juan [name] Date of Exam: 03/03/2024 Medical Rec #:  [id number]       Height:       65.0 in Accession #:    [id number]      Weight:       178.0 lb Date of Birth:  [birth year]       BSA:          1.883 m Patient Age:    58 years        BP:           85/55 mmHg Patient Gender: M               HR:           67 bpm. Exam Location:  Inpatient Procedure: 2D Echo, Cardiac Doppler and Color Doppler (Both Spectral and Color            Flow Doppler were utilized during procedure). Indications:    Bacteremia  History:        Patient has prior history of Echocardiogram examinations, most  recent 09/14/2023.  Sonographer:    Philomena Daring Referring Phys: CAMILA DELENA NED IMPRESSIONS  1. Left ventricular  ejection fraction, by estimation, is 60 to 65%. The left ventricle has normal function. The left ventricle has no regional wall motion abnormalities. There is mild left ventricular hypertrophy. Left ventricular diastolic parameters were normal.  2. Right ventricular systolic function is normal. The right ventricular size is normal.  3. The mitral valve is abnormal. Trivial mitral valve regurgitation. No evidence of mitral stenosis.  4. Compared to 09/14/23 no real change mean gradient 18-16 mmHg peak 31.4->29 mmHg Difficult to r/o vegeatations on a severely calcified AV If concern for SBE is high can consider TEE. The aortic valve is tricuspid. There is severe calcifcation of the aortic valve. There is severe thickening of the aortic valve. Aortic valve regurgitation is mild. Moderate aortic valve stenosis.  5. The inferior vena cava is normal in size with greater than 50% respiratory variability, suggesting right atrial pressure of 3 mmHg. FINDINGS  Left Ventricle: Left ventricular ejection fraction, by estimation, is 60 to 65%. The left ventricle has normal function. The left ventricle has no regional wall motion abnormalities. Strain was performed and the global longitudinal strain is indeterminate. The left ventricular internal cavity size was normal in size. There is mild left ventricular hypertrophy. Left ventricular diastolic parameters were normal. Right Ventricle: The right ventricular size is normal. No increase in right ventricular wall thickness. Right ventricular systolic function is normal. Left Atrium: Left atrial size was normal in size. Right Atrium: Right atrial size was normal in size. Pericardium: There is no evidence of pericardial effusion. Mitral Valve: The mitral valve is abnormal. There is mild thickening of the mitral valve leaflet(s). There is mild calcification of the mitral valve leaflet(s). Mild mitral annular calcification. Trivial mitral valve regurgitation. No evidence of mitral valve  stenosis. Tricuspid Valve: The tricuspid valve is normal in structure. Tricuspid valve regurgitation is mild . No evidence of tricuspid stenosis. Aortic Valve: Compared to 09/14/23 no real change mean gradient 18-16 mmHg peak 31.4->29 mmHg Difficult to r/o vegeatations on a severely calcified AV If concern for SBE is high can consider TEE. The aortic valve is tricuspid. There is severe calcifcation  of the aortic valve. There is severe thickening of the aortic valve. Aortic valve regurgitation is mild. Moderate aortic stenosis is present. Aortic valve mean gradient measures 16.0 mmHg. Aortic valve peak gradient measures 29.0 mmHg. Aortic valve area, by VTI measures 1.29 cm. Pulmonic Valve: The pulmonic valve was normal in structure. Pulmonic valve regurgitation is not visualized. No evidence of pulmonic stenosis. Aorta: The aortic root is normal in size and structure. Venous: The inferior vena cava is normal in size with greater than 50% respiratory variability, suggesting right atrial pressure of 3 mmHg. IAS/Shunts: No atrial level shunt detected by color flow Doppler. Additional Comments: 3D was performed not requiring image post processing on an independent workstation and was indeterminate.  LEFT VENTRICLE PLAX 2D LVIDd:         4.00 cm     Diastology LVIDs:         2.40 cm     LV e' medial:    10.20 cm/s LV PW:         1.00 cm     LV E/e' medial:  11.6 LV IVS:        1.30 cm     LV e' lateral:   12.30 cm/s LVOT diam:     1.80 cm  LV E/e' lateral: 9.6 LV SV:         73 LV SV Index:   39 LVOT Area:     2.54 cm  LV Volumes (MOD) LV vol d, MOD A2C: 67.6 ml LV vol d, MOD A4C: 99.7 ml LV vol s, MOD A2C: 17.5 ml LV vol s, MOD A4C: 27.4 ml LV SV MOD A2C:     50.1 ml LV SV MOD A4C:     99.7 ml LV SV MOD BP:      62.3 ml RIGHT VENTRICLE             IVC RV S prime:     13.50 cm/s  IVC diam: 1.40 cm TAPSE (M-mode): 2.5 cm LEFT ATRIUM             Index        RIGHT ATRIUM           Index LA diam:        3.70 cm 1.97  cm/m   RA Area:     16.70 cm LA Vol (A2C):   48.8 ml 25.92 ml/m  RA Volume:   45.00 ml  23.90 ml/m LA Vol (A4C):   43.4 ml 23.05 ml/m LA Biplane Vol: 46.7 ml 24.81 ml/m  AORTIC VALVE AV Area (Vmax):    1.24 cm AV Area (Vmean):   1.29 cm AV Area (VTI):     1.29 cm AV Vmax:           269.33 cm/s AV Vmean:          182.000 cm/s AV VTI:            0.567 m AV Peak Grad:      29.0 mmHg AV Mean Grad:      16.0 mmHg LVOT Vmax:         131.50 cm/s LVOT Vmean:        92.250 cm/s LVOT VTI:          0.286 m LVOT/AV VTI ratio: 0.51  AORTA Ao Root diam: 3.20 cm Ao Asc diam:  3.50 cm MITRAL VALVE MV Area (PHT): 3.37 cm     SHUNTS MV Decel Time: 225 msec     Systemic VTI:  0.29 m MV E velocity: 118.00 cm/s  Systemic Diam: 1.80 cm MV A velocity: 72.80 cm/s MV E/A ratio:  1.62 Maude Emmer MD Electronically signed by Maude Emmer MD Signature Date/Time: 03/03/2024/11:20:03 AM    Final    US  Abdomen Limited RUQ (LIVER/GB) Result Date: 03/03/2024 CLINICAL DATA:  6194 Acute cholecystitis 6194 EXAM: ULTRASOUND ABDOMEN LIMITED RIGHT UPPER QUADRANT COMPARISON:  March 03, 2024 FINDINGS: Gallbladder: Cholelithiasis. There is circumferential gallbladder wall thickening to 4 mm. There is trace pericholecystic fluid. Gallbladder wall appears edematous. No sonographic Beverley sign is reported. Common bile duct: Diameter: Visualized portion measures 2 mm, within normal limits. Majority is not visualized. Liver: No focal lesion identified. Heterogeneous and coarsened parenchymal echogenicity. Nodular liver contour. Portal vein is grossly patent on color Doppler imaging with normal direction of blood flow towards the liver is poorly visualized. Other: Small volume ascites. IMPRESSION: 1. Cholelithiasis with circumferential gallbladder wall thickening and trace pericholecystic fluid. Findings are equivocal for acute cholecystitis in the setting of cirrhosis as well as reported pancreatitis. If further imaging is desired, HIDA scan could  differentiate. 2. Cirrhotic liver morphology. 3. Small volume ascites. Electronically Signed   By: Corean Salter M.D.   On: 03/03/2024 09:13   MR ABDOMEN MRCP  W WO CONTAST Result Date: 03/03/2024 CLINICAL DATA:  Acute pancreatitis. EXAM: MRI ABDOMEN WITHOUT AND WITH CONTRAST (INCLUDING MRCP) TECHNIQUE: Multiplanar multisequence MR imaging of the abdomen was performed both before and after the administration of intravenous contrast. Heavily T2-weighted images of the biliary and pancreatic ducts were obtained, and three-dimensional MRCP images were rendered by post processing. CONTRAST:  8mL GADAVIST  GADOBUTROL  1 MMOL/ML IV SOLN COMPARISON:  CT scan earlier same day FINDINGS: Lower chest: Dependent atelectasis. Hepatobiliary: Nodular liver contour compatible cirrhosis. No arterial phase hyperenhancement or focal restricted diffusion within the liver parenchyma. Tiny hepatic cysts are seen in both hepatic lobes. Gallbladder is distended with gallbladder wall edema/thickening. Numerous layering tiny 1-3 mm gallstones evident. MRCP imaging is markedly motion degraded limiting assessment. While no definite choledocholithiasis on MRCP images, axial T2 haste imaging (23/3) raises the question of a 3 mm stone at the ampulla. Common duct measures approximately 7 mm diameter. Common bile duct measures approximately 5 mm diameter. Pancreas: Pancreatic and peripancreatic edema evident, compatible with the reported clinical history of pancreatitis. 11 x 11 mm cystic lesion is identified in the body of pancreas (18/3) Spleen:  No splenomegaly. No suspicious focal mass lesion. Adrenals/Urinary Tract: No adrenal nodule or mass. Tiny T2 hyperintensities without enhancement in both kidneys are too small to characterize but statistically most likely benign cyst. No followup imaging is recommended. Stomach/Bowel: Stomach is decompressed. Duodenum is normally positioned as is the ligament of Treitz. No small bowel or colonic  dilatation within the visualized abdomen. Vascular/Lymphatic: No abdominal aortic aneurysm. No abdominal aortic atherosclerotic calcification. Portal vein is patent. Recanalization of the paraumbilical vein evident. Prominent left abdominal varices. There is no gastrohepatic or hepatoduodenal ligament lymphadenopathy. No retroperitoneal or mesenteric lymphadenopathy. Other: No substantial intraperitoneal free fluid although there is some trace fluid around the liver and in the para colic gutters. Musculoskeletal: No focal suspicious marrow enhancement within the visualized bony anatomy. IMPRESSION: 1. Pancreatic and peripancreatic edema, compatible with the reported clinical history of pancreatitis. 2. Cholelithiasis with gallbladder wall edema/thickening, compatible with cholecystitis. Gallbladder wall thickening can also be seen in the setting of liver insufficiency/systemic disease. MRCP imaging is markedly motion degraded limiting assessment. While no definite choledocholithiasis on MRCP images, axial T2 imaging raises the question of a 3 mm stone at the ampulla. ERCP could be used to further evaluate. 3. 11 x 11 mm cystic lesion in the body of pancreas. 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 Follow-up MRI abdomen with and without contrast recommended in 1 year. This recommendation follows ACR consensus guidelines: Management of Incidental Pancreatic Cysts: A White Paper of the ACR Incidental Findings Committee. J Am Coll Radiol 2017;14:911-923. 4. Cirrhosis with evidence of portal hypertension including recanalization of the paraumbilical vein and prominent left abdominal varices. Electronically Signed   By: Camellia Candle M.D.   On: 03/03/2024 09:07   MR 3D Recon At Scanner Result Date: 03/03/2024 CLINICAL DATA:  Acute pancreatitis. EXAM: MRI ABDOMEN WITHOUT AND WITH CONTRAST (INCLUDING MRCP) TECHNIQUE: Multiplanar multisequence MR imaging of the abdomen was performed both before and after  the administration of intravenous contrast. Heavily T2-weighted images of the biliary and pancreatic ducts were obtained, and three-dimensional MRCP images were rendered by post processing. CONTRAST:  8mL GADAVIST  GADOBUTROL  1 MMOL/ML IV SOLN COMPARISON:  CT scan earlier same day FINDINGS: Lower chest: Dependent atelectasis. Hepatobiliary: Nodular liver contour compatible cirrhosis. No arterial phase hyperenhancement or focal restricted diffusion within the liver parenchyma. Tiny hepatic cysts are seen in  both hepatic lobes. Gallbladder is distended with gallbladder wall edema/thickening. Numerous layering tiny 1-3 mm gallstones evident. MRCP imaging is markedly motion degraded limiting assessment. While no definite choledocholithiasis on MRCP images, axial T2 haste imaging (23/3) raises the question of a 3 mm stone at the ampulla. Common duct measures approximately 7 mm diameter. Common bile duct measures approximately 5 mm diameter. Pancreas: Pancreatic and peripancreatic edema evident, compatible with the reported clinical history of pancreatitis. 11 x 11 mm cystic lesion is identified in the body of pancreas (18/3) Spleen:  No splenomegaly. No suspicious focal mass lesion. Adrenals/Urinary Tract: No adrenal nodule or mass. Tiny T2 hyperintensities without enhancement in both kidneys are too small to characterize but statistically most likely benign cyst. No followup imaging is recommended. Stomach/Bowel: Stomach is decompressed. Duodenum is normally positioned as is the ligament of Treitz. No small bowel or colonic dilatation within the visualized abdomen. Vascular/Lymphatic: No abdominal aortic aneurysm. No abdominal aortic atherosclerotic calcification. Portal vein is patent. Recanalization of the paraumbilical vein evident. Prominent left abdominal varices. There is no gastrohepatic or hepatoduodenal ligament lymphadenopathy. No retroperitoneal or mesenteric lymphadenopathy. Other: No substantial  intraperitoneal free fluid although there is some trace fluid around the liver and in the para colic gutters. Musculoskeletal: No focal suspicious marrow enhancement within the visualized bony anatomy. IMPRESSION: 1. Pancreatic and peripancreatic edema, compatible with the reported clinical history of pancreatitis. 2. Cholelithiasis with gallbladder wall edema/thickening, compatible with cholecystitis. Gallbladder wall thickening can also be seen in the setting of liver insufficiency/systemic disease. MRCP imaging is markedly motion degraded limiting assessment. While no definite choledocholithiasis on MRCP images, axial T2 imaging raises the question of a 3 mm stone at the ampulla. ERCP could be used to further evaluate. 3. 11 x 11 mm cystic lesion in the body of pancreas. 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 Follow-up MRI abdomen with and without contrast recommended in 1 year. This recommendation follows ACR consensus guidelines: Management of Incidental Pancreatic Cysts: A White Paper of the ACR Incidental Findings Committee. J Am Coll Radiol 2017;14:911-923. 4. Cirrhosis with evidence of portal hypertension including recanalization of the paraumbilical vein and prominent left abdominal varices. Electronically Signed   By: Camellia Candle M.D.   On: 03/03/2024 09:07   CT CHEST ABDOMEN PELVIS W CONTRAST Result Date: 03/03/2024 CLINICAL DATA:  Sepsis EXAM: CT CHEST, ABDOMEN, AND PELVIS WITH CONTRAST TECHNIQUE: Multidetector CT imaging of the chest, abdomen and pelvis was performed following the standard protocol during bolus administration of intravenous contrast. RADIATION DOSE REDUCTION: This exam was performed according to the departmental dose-optimization program which includes automated exposure control, adjustment of the mA and/or kV according to patient size and/or use of iterative reconstruction technique. 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 Cardiomegaly. Left and right coronary artery calcifications. No pericardial effusion. Mediastinum/Nodes: No enlarged mediastinal, hilar, or axillary lymph nodes. Thyroid  gland, trachea, and esophagus demonstrate no significant findings. Lungs/Pleura: Trace bilateral pleural effusions and associated atelectasis or consolidation. Diffuse bilateral bronchial wall thickening and interlobular septal thickening throughout the lung bases. Musculoskeletal: No chest wall abnormality. No acute osseous findings. CT ABDOMEN PELVIS FINDINGS Hepatobiliary: Coarse, nodular contour of the liver. Small gallstones. Gallbladder wall thickening and trace pericholecystic fluid. No biliary ductal dilatation. Pancreas: Diffuse inflammatory fat stranding and fluid about the pancreas. No pancreatic ductal dilatation or acute pancreatic fluid collection.  Spleen: Normal in size without significant abnormality. Adrenals/Urinary Tract: Adrenal glands are unremarkable. Kidneys are normal, without renal calculi, solid lesion, or hydronephrosis. Bladder is unremarkable. Stomach/Bowel: Stomach is within normal limits. Appendix appears normal. Diffuse wall thickening and mucosal hyperenhancement of the cecum (series 7, image 65). Vascular/Lymphatic: Aortic atherosclerosis. Recanalization of the umbilical vein with ventral abdominal and left upper quadrant varices. No enlarged abdominal or pelvic lymph nodes. Reproductive: No mass or other abnormality. Other: No abdominal wall hernia or abnormality. Small volume perihepatic ascites. Musculoskeletal: No acute osseous findings. IMPRESSION: 1. Diffuse inflammatory fat stranding and fluid about the pancreas, consistent with acute pancreatitis. No pancreatic ductal dilatation or acute pancreatic fluid collection. 2. Cholelithiasis. Gallbladder wall thickening  and trace pericholecystic fluid, concerning for cholecystitis although somewhat nonspecific in the setting of ascites. Overall constellation of findings could be explained by cholecystitis and gallstone pancreatitis. 3. Diffuse wall thickening and mucosal hyperenhancement of the cecum likely related to portal colopathy. 4. Cirrhosis. Small volume perihepatic ascites. Recanalization of the umbilical vein with ventral abdominal and left upper quadrant varices. 5. Trace bilateral pleural effusions and associated atelectasis or consolidation. Diffuse bilateral bronchial wall thickening and interlobular septal thickening throughout the lung bases. Findings consistent with pulmonary edema. 6. Cardiomegaly and coronary artery disease. Aortic Atherosclerosis (ICD10-I70.0). Electronically Signed   By: Marolyn JONETTA Jaksch M.D.   On: 03/03/2024 06:10   DG Chest Port 1 View Result Date: 03/03/2024 CLINICAL DATA:  Possible sepsis EXAM: PORTABLE CHEST 1 VIEW COMPARISON:  02/20/2024 FINDINGS: Cardiac shadow is stable. Lungs are well aerated bilaterally. Mediastinal markings are somewhat accentuated secondary to patient motion artifact. No bony abnormality is seen. IMPRESSION: No active disease. Electronically Signed   By: Oneil Devonshire M.D.   On: 03/03/2024 01:31   MR BRAIN WO CONTRAST Result Date: 02/28/2024 CLINICAL DATA:  Stroke, follow up EXAM: MRI HEAD WITHOUT CONTRAST TECHNIQUE: Multiplanar, multiecho pulse sequences of the brain and surrounding structures were obtained without intravenous contrast. COMPARISON:  CT head May 25, 2023 FINDINGS: Brain: No acute infarction, hemorrhage, hydrocephalus, extra-axial collection or mass lesion. Vascular: Major arterial flow voids are maintained. Skull and upper cervical spine: Normal marrow signal. Sinuses/Orbits: Negative. IMPRESSION: No evidence of acute intracranial abnormality. Electronically Signed   By: Gilmore GORMAN Molt M.D.   On: 02/28/2024 00:07   DG Chest 2 View Result  Date: 02/20/2024 CLINICAL DATA:  Bilateral lower leg swelling x3 days. EXAM: CHEST - 2 VIEW COMPARISON:  September 25, 2023 FINDINGS: The heart size and mediastinal contours are within normal limits. Both lungs are clear. The visualized skeletal structures are unremarkable. IMPRESSION: No active cardiopulmonary disease. Electronically Signed   By: Suzen Dials M.D.   On: 02/20/2024 22:17     Subjective: Patient feels better and wants to be discharged, states he doesn't want surgery.  Discharge Exam: Vitals:   03/09/24 0440 03/09/24 0808  BP: 106/63 109/69  Pulse: 80 77  Resp: 16 17  Temp: 98.3 F (36.8 C) 98.7 F (37.1 C)  SpO2: 97% 100%   Vitals:   03/08/24 1635 03/08/24 2210 03/09/24 0440 03/09/24 0808  BP: 95/71 110/70 106/63 109/69  Pulse: 75 79 80 77  Resp: 18 16 16 17   Temp: 98.4 F (36.9 C) 98.1 F (36.7 C) 98.3 F (36.8 C) 98.7 F (37.1 C)  TempSrc: Oral Oral Oral Oral  SpO2: 100% 99% 97% 100%  Weight:      Height:        General: Pt is alert, awake, not in  acute distress Cardiovascular: RRR, S1/S2 +, no rubs, no gallops Respiratory: CTA bilaterally, no wheezing, no rhonchi Abdominal: Soft, NT, ND, bowel sounds + Extremities: no edema, no cyanosis    The results of significant diagnostics from this hospitalization (including imaging, microbiology, ancillary and laboratory) are listed below for reference.     Microbiology: Recent Results (from the past 240 hours)  Blood culture (routine single)     Status: Abnormal   Collection Time: 02/29/24  3:50 PM   Specimen: Blood   VB  Result Value Ref Range Status   BLOOD CULTURE, ROUTINE Final report (A)  Final   Organism ID, Bacteria Comment (A)  Final    Comment: Streptococcus parasanguinis Recovered from aerobic and anaerobic bottles.    Antimicrobial Susceptibility Comment  Final    Comment:       ** S = Susceptible; I = Intermediate; R = Resistant **                    P = Positive; N = Negative              MICS are expressed in micrograms per mL    Antibiotic                 RSLT#1    RSLT#2    RSLT#3    RSLT#4 Cefepime                        S Cefotaxime                     S Ceftriaxone                     S Chloramphenicol                S Clindamycin                    S Erythromycin                    R Levofloxacin                   S Penicillin                     I Vancomycin                      S   Blood Culture (routine x 2)     Status: Abnormal   Collection Time: 03/03/24  1:01 AM   Specimen: BLOOD  Result Value Ref Range Status   Specimen Description BLOOD SITE NOT SPECIFIED  Final   Special Requests   Final    BOTTLES DRAWN AEROBIC AND ANAEROBIC Blood Culture results may not be optimal due to an inadequate volume of blood received in culture bottles   Culture  Setup Time   Final    GRAM POSITIVE COCCI IN CHAINS IN BOTH AEROBIC AND ANAEROBIC BOTTLES CRITICAL VALUE NOTED.  VALUE IS CONSISTENT WITH PREVIOUSLY REPORTED AND CALLED VALUE. CRITICAL RESULT CALLED TO, READ BACK BY AND VERIFIED WITH: PHARMD EMILY S on [id number] @1110  by SM Performed at [facility], [address] [place], [place] [address]    Culture STREPTOCOCCUS PARASANGUINIS (A)  Final   Report Status 03/08/2024 FINAL  Final   Organism ID, Bacteria STREPTOCOCCUS PARASANGUINIS  Final      Susceptibility   Streptococcus parasanguinis -  MIC*    PENICILLIN 0.12 SENSITIVE Sensitive     CEFTRIAXONE  <=0.12 SENSITIVE Sensitive     LEVOFLOXACIN 1 SENSITIVE Sensitive     VANCOMYCIN  0.5 SENSITIVE Sensitive     * STREPTOCOCCUS PARASANGUINIS  Blood Culture (routine x 2)     Status: Abnormal   Collection Time: 03/03/24  1:06 AM   Specimen: BLOOD  Result Value Ref Range Status   Specimen Description BLOOD SITE NOT SPECIFIED  Final   Special Requests   Final    BOTTLES DRAWN AEROBIC AND ANAEROBIC Blood Culture results may not be optimal due to an inadequate volume of blood received in culture bottles   Culture   Setup Time   Final    GRAM POSITIVE COCCI IN CHAINS IN BOTH AEROBIC AND ANAEROBIC BOTTLES CRITICAL RESULT CALLED TO, READ BACK BY AND VERIFIED WITH: PHARMD MADELINE MITCHELL ON 03/05/24 @ 1825 BY DRT Performed at [facility], [address], [place], [place] [address]    Culture STREPTOCOCCUS MITIS/ORALIS (A)  Final   Report Status 03/07/2024 FINAL  Final   Organism ID, Bacteria STREPTOCOCCUS MITIS/ORALIS  Final      Susceptibility   Streptococcus mitis/oralis - MIC*    PENICILLIN 0.12 SENSITIVE Sensitive     CEFTRIAXONE  <=0.12 SENSITIVE Sensitive     LEVOFLOXACIN 0.5 SENSITIVE Sensitive     VANCOMYCIN  0.5 SENSITIVE Sensitive     * STREPTOCOCCUS MITIS/ORALIS  Blood Culture ID Panel (Reflexed)     Status: Abnormal   Collection Time: 03/03/24  1:06 AM  Result Value Ref Range Status   Enterococcus faecalis NOT DETECTED NOT DETECTED Final   Enterococcus Faecium NOT DETECTED NOT DETECTED Final   Listeria monocytogenes NOT DETECTED NOT DETECTED Final   Staphylococcus species NOT DETECTED NOT DETECTED Final   Staphylococcus aureus (BCID) NOT DETECTED NOT DETECTED Final   Staphylococcus epidermidis NOT DETECTED NOT DETECTED Final   Staphylococcus lugdunensis NOT DETECTED NOT DETECTED Final   Streptococcus species DETECTED (A) NOT DETECTED Final    Comment: Not Enterococcus species, Streptococcus agalactiae, Streptococcus pyogenes, or Streptococcus pneumoniae. CRITICAL RESULT CALLED TO, READ BACK BY AND VERIFIED WITH: PHARMD MADELINE MITCHELL ON 03/05/24 @ 1825 BY DRT    Streptococcus agalactiae NOT DETECTED NOT DETECTED Final   Streptococcus pneumoniae NOT DETECTED NOT DETECTED Final   Streptococcus pyogenes NOT DETECTED NOT DETECTED Final   A.calcoaceticus-baumannii NOT DETECTED NOT DETECTED Final   Bacteroides fragilis NOT DETECTED NOT DETECTED Final   Enterobacterales NOT DETECTED NOT DETECTED Final   Enterobacter cloacae complex NOT DETECTED NOT DETECTED Final   Escherichia  coli NOT DETECTED NOT DETECTED Final   Klebsiella aerogenes NOT DETECTED NOT DETECTED Final   Klebsiella oxytoca NOT DETECTED NOT DETECTED Final   Klebsiella pneumoniae NOT DETECTED NOT DETECTED Final   Proteus species NOT DETECTED NOT DETECTED Final   Salmonella species NOT DETECTED NOT DETECTED Final   Serratia marcescens NOT DETECTED NOT DETECTED Final   Haemophilus influenzae NOT DETECTED NOT DETECTED Final   Neisseria meningitidis NOT DETECTED NOT DETECTED Final   Pseudomonas aeruginosa NOT DETECTED NOT DETECTED Final   Stenotrophomonas maltophilia NOT DETECTED NOT DETECTED Final   Candida albicans NOT DETECTED NOT DETECTED Final   Candida auris NOT DETECTED NOT DETECTED Final   Candida glabrata NOT DETECTED NOT DETECTED Final   Candida krusei NOT DETECTED NOT DETECTED Final   Candida parapsilosis NOT DETECTED NOT DETECTED Final   Candida tropicalis NOT DETECTED NOT DETECTED Final   Cryptococcus neoformans/gattii NOT DETECTED NOT  DETECTED Final    Comment: Performed at [facility], [address], [place], [place] [address]  Resp panel by RT-PCR (RSV, Flu A&B, Covid) Anterior Nasal Swab     Status: None   Collection Time: 03/03/24  1:12 AM   Specimen: Anterior Nasal Swab  Result Value Ref Range Status   SARS Coronavirus 2 by RT PCR NEGATIVE NEGATIVE Final   Influenza A by PCR NEGATIVE NEGATIVE Final   Influenza B by PCR NEGATIVE NEGATIVE Final    Comment: (NOTE) The Xpert Xpress SARS-CoV-2/FLU/RSV plus assay is intended as an aid in the diagnosis of influenza from Nasopharyngeal swab specimens and should not be used as a sole basis for treatment. Nasal washings and aspirates are unacceptable for Xpert Xpress SARS-CoV-2/FLU/RSV testing.  Fact Sheet for Patients: BloggerCourse.com  Fact Sheet for Healthcare Providers: SeriousBroker.it  This test is not yet approved or cleared by the [place]  FDA and has been  authorized for detection and/or diagnosis of SARS-CoV-2 by FDA under an Emergency Use Authorization (EUA). This EUA will remain in effect (meaning this test can be used) for the duration of the COVID-19 declaration under Section 564(b)(1) of the Act, 21 U.S.C. section 360bbb-3(b)(1), unless the authorization is terminated or revoked.     Resp Syncytial Virus by PCR NEGATIVE NEGATIVE Final    Comment: (NOTE) Fact Sheet for Patients: BloggerCourse.com  Fact Sheet for Healthcare Providers: SeriousBroker.it  This test is not yet approved or cleared by the [place]  FDA and has been authorized for detection and/or diagnosis of SARS-CoV-2 by FDA under an Emergency Use Authorization (EUA). This EUA will remain in effect (meaning this test can be used) for the duration of the COVID-19 declaration under Section 564(b)(1) of the Act, 21 U.S.C. section 360bbb-3(b)(1), unless the authorization is terminated or revoked.  Performed at [facility], [address], [place], [place] [address]   Culture, blood (Routine X 2) w Reflex to ID Panel     Status: None (Preliminary result)   Collection Time: 03/06/24 10:06 AM   Specimen: BLOOD RIGHT ARM  Result Value Ref Range Status   Specimen Description BLOOD RIGHT ARM  Final   Special Requests   Final    BOTTLES DRAWN AEROBIC AND ANAEROBIC Blood Culture adequate volume   Culture   Final    NO GROWTH 3 DAYS Performed at [facility], [address] [place], [place] [address]    Report Status PENDING  Incomplete  Culture, blood (Routine X 2) w Reflex to ID Panel     Status: None (Preliminary result)   Collection Time: 03/06/24 10:06 AM   Specimen: BLOOD RIGHT HAND  Result Value Ref Range Status   Specimen Description BLOOD RIGHT HAND  Final   Special Requests   Final    BOTTLES DRAWN AEROBIC AND ANAEROBIC Blood Culture adequate volume   Culture   Final    NO GROWTH 3  DAYS Performed at [facility], [address], [place], [place] [address]    Report Status PENDING  Incomplete     Labs: BNP (last 3 results) Recent Labs    09/23/23 1258 09/25/23 2014 03/03/24 1150  BNP 102.5* 168.7* 550.7*   Basic Metabolic Panel: Recent Labs  Lab 03/05/24 0637 03/06/24 0638 03/07/24 0557 03/08/24 0633 03/09/24 0308  NA 136 137 137 135 136  K 3.0* 3.6 3.4* 3.4* 3.3*  CL 108 107 107 109 111  CO2 20* 21* 21* 19* 20*  GLUCOSE 83 81 78 85 100*  BUN 7 8 6  5* 5*  CREATININE 1.04 1.10 0.87 0.87 0.82  CALCIUM  7.5* 7.4* 7.0* 6.8* 6.7*  MG  --  1.2* 1.8  --   --    Liver Function Tests: Recent Labs  Lab 03/03/24 0101 03/03/24 0505 03/05/24 0637  AST 61* 61* 62*  ALT 25 26 30   ALKPHOS 66 61 86  BILITOT 3.7* 4.3* 3.6*  PROT 4.5* 4.4* 5.1*  ALBUMIN 1.7* 1.6* 1.9*   Recent Labs  Lab 03/03/24 1148  LIPASE 28   Recent Labs  Lab 03/03/24 0111  AMMONIA 30   CBC: Recent Labs  Lab 03/03/24 0101 03/03/24 0505 03/04/24 0903 03/05/24 0637 03/07/24 0557 03/09/24 0308  WBC 8.5 17.3* 12.6* 11.4* 6.5 7.1  NEUTROABS 7.2  --   --   --   --   --   HGB 7.6* 6.7* 8.1* 8.7* 8.2* 7.7*  HCT 24.2* 20.8* 25.4* 26.7* 26.1* 24.3*  MCV 85.5 84.9 85.2 83.7 85.9 88.4  PLT 57* 56* 59* 73* 72* 66*   Cardiac Enzymes: No results for input(s): CKTOTAL, CKMB, CKMBINDEX, TROPONINI in the last 168 hours. BNP: Invalid input(s): POCBNP CBG: No results for input(s): GLUCAP in the last 168 hours. D-Dimer No results for input(s): DDIMER in the last 72 hours. Hgb A1c No results for input(s): HGBA1C in the last 72 hours. Lipid Profile No results for input(s): CHOL, HDL, LDLCALC, TRIG, CHOLHDL, LDLDIRECT in the last 72 hours. Thyroid  function studies No results for input(s): TSH, T4TOTAL, T3FREE, THYROIDAB in the last 72 hours.  Invalid input(s): FREET3 Anemia work up No results for input(s): VITAMINB12, FOLATE,  FERRITIN, TIBC, IRON, RETICCTPCT in the last 72 hours. Urinalysis    Component Value Date/Time   COLORURINE AMBER (A) 03/03/2024 0343   APPEARANCEUR CLEAR 03/03/2024 0343   LABSPEC 1.013 03/03/2024 0343   PHURINE 6.0 03/03/2024 0343   GLUCOSEU NEGATIVE 03/03/2024 0343   HGBUR LARGE (A) 03/03/2024 0343   BILIRUBINUR NEGATIVE 03/03/2024 0343   BILIRUBINUR small (A) 04/17/2021 1646   KETONESUR NEGATIVE 03/03/2024 0343   PROTEINUR NEGATIVE 03/03/2024 0343   UROBILINOGEN 4.0 (A) 04/17/2021 1646   NITRITE NEGATIVE 03/03/2024 0343   LEUKOCYTESUR NEGATIVE 03/03/2024 0343   Sepsis Labs Recent Labs  Lab 03/04/24 0903 03/05/24 0637 03/07/24 0557 03/09/24 0308  WBC 12.6* 11.4* 6.5 7.1   Microbiology Recent Results (from the past 240 hours)  Blood culture (routine single)     Status: Abnormal   Collection Time: 02/29/24  3:50 PM   Specimen: Blood   VB  Result Value Ref Range Status   BLOOD CULTURE, ROUTINE Final report (A)  Final   Organism ID, Bacteria Comment (A)  Final    Comment: Streptococcus parasanguinis Recovered from aerobic and anaerobic bottles.    Antimicrobial Susceptibility Comment  Final    Comment:       ** S = Susceptible; I = Intermediate; R = Resistant **                    P = Positive; N = Negative             MICS are expressed in micrograms per mL    Antibiotic                 RSLT#1    RSLT#2    RSLT#3    RSLT#4 Cefepime   S Cefotaxime                     S Ceftriaxone                     S Chloramphenicol                S Clindamycin                    S Erythromycin                    R Levofloxacin                   S Penicillin                     I Vancomycin                      S   Blood Culture (routine x 2)     Status: Abnormal   Collection Time: 03/03/24  1:01 AM   Specimen: BLOOD  Result Value Ref Range Status   Specimen Description BLOOD SITE NOT SPECIFIED  Final   Special Requests   Final    BOTTLES DRAWN  AEROBIC AND ANAEROBIC Blood Culture results may not be optimal due to an inadequate volume of blood received in culture bottles   Culture  Setup Time   Final    GRAM POSITIVE COCCI IN CHAINS IN BOTH AEROBIC AND ANAEROBIC BOTTLES CRITICAL VALUE NOTED.  VALUE IS CONSISTENT WITH PREVIOUSLY REPORTED AND CALLED VALUE. CRITICAL RESULT CALLED TO, READ BACK BY AND VERIFIED WITH: PHARMD EMILY S on [id number] @1110  by SM Performed at [facility], [address], [place], [place] [address]    Culture STREPTOCOCCUS PARASANGUINIS (A)  Final   Report Status 03/08/2024 FINAL  Final   Organism ID, Bacteria STREPTOCOCCUS PARASANGUINIS  Final      Susceptibility   Streptococcus parasanguinis - MIC*    PENICILLIN 0.12 SENSITIVE Sensitive     CEFTRIAXONE  <=0.12 SENSITIVE Sensitive     LEVOFLOXACIN 1 SENSITIVE Sensitive     VANCOMYCIN  0.5 SENSITIVE Sensitive     * STREPTOCOCCUS PARASANGUINIS  Blood Culture (routine x 2)     Status: Abnormal   Collection Time: 03/03/24  1:06 AM   Specimen: BLOOD  Result Value Ref Range Status   Specimen Description BLOOD SITE NOT SPECIFIED  Final   Special Requests   Final    BOTTLES DRAWN AEROBIC AND ANAEROBIC Blood Culture results may not be optimal due to an inadequate volume of blood received in culture bottles   Culture  Setup Time   Final    GRAM POSITIVE COCCI IN CHAINS IN BOTH AEROBIC AND ANAEROBIC BOTTLES CRITICAL RESULT CALLED TO, READ BACK BY AND VERIFIED WITH: PHARMD MADELINE MITCHELL ON 03/05/24 @ 1825 BY DRT Performed at [facility], [address], [place], [place] [address]    Culture STREPTOCOCCUS MITIS/ORALIS (A)  Final   Report Status 03/07/2024 FINAL  Final   Organism ID, Bacteria STREPTOCOCCUS MITIS/ORALIS  Final      Susceptibility   Streptococcus mitis/oralis - MIC*    PENICILLIN 0.12 SENSITIVE Sensitive     CEFTRIAXONE  <=0.12 SENSITIVE Sensitive     LEVOFLOXACIN 0.5 SENSITIVE Sensitive     VANCOMYCIN  0.5 SENSITIVE Sensitive     *  STREPTOCOCCUS MITIS/ORALIS  Blood Culture ID Panel (Reflexed)     Status:  Abnormal   Collection Time: 03/03/24  1:06 AM  Result Value Ref Range Status   Enterococcus faecalis NOT DETECTED NOT DETECTED Final   Enterococcus Faecium NOT DETECTED NOT DETECTED Final   Listeria monocytogenes NOT DETECTED NOT DETECTED Final   Staphylococcus species NOT DETECTED NOT DETECTED Final   Staphylococcus aureus (BCID) NOT DETECTED NOT DETECTED Final   Staphylococcus epidermidis NOT DETECTED NOT DETECTED Final   Staphylococcus lugdunensis NOT DETECTED NOT DETECTED Final   Streptococcus species DETECTED (A) NOT DETECTED Final    Comment: Not Enterococcus species, Streptococcus agalactiae, Streptococcus pyogenes, or Streptococcus pneumoniae. CRITICAL RESULT CALLED TO, READ BACK BY AND VERIFIED WITH: PHARMD MADELINE MITCHELL ON 03/05/24 @ 1825 BY DRT    Streptococcus agalactiae NOT DETECTED NOT DETECTED Final   Streptococcus pneumoniae NOT DETECTED NOT DETECTED Final   Streptococcus pyogenes NOT DETECTED NOT DETECTED Final   A.calcoaceticus-baumannii NOT DETECTED NOT DETECTED Final   Bacteroides fragilis NOT DETECTED NOT DETECTED Final   Enterobacterales NOT DETECTED NOT DETECTED Final   Enterobacter cloacae complex NOT DETECTED NOT DETECTED Final   Escherichia coli NOT DETECTED NOT DETECTED Final   Klebsiella aerogenes NOT DETECTED NOT DETECTED Final   Klebsiella oxytoca NOT DETECTED NOT DETECTED Final   Klebsiella pneumoniae NOT DETECTED NOT DETECTED Final   Proteus species NOT DETECTED NOT DETECTED Final   Salmonella species NOT DETECTED NOT DETECTED Final   Serratia marcescens NOT DETECTED NOT DETECTED Final   Haemophilus influenzae NOT DETECTED NOT DETECTED Final   Neisseria meningitidis NOT DETECTED NOT DETECTED Final   Pseudomonas aeruginosa NOT DETECTED NOT DETECTED Final   Stenotrophomonas maltophilia NOT DETECTED NOT DETECTED Final   Candida albicans NOT DETECTED NOT DETECTED Final   Candida  auris NOT DETECTED NOT DETECTED Final   Candida glabrata NOT DETECTED NOT DETECTED Final   Candida krusei NOT DETECTED NOT DETECTED Final   Candida parapsilosis NOT DETECTED NOT DETECTED Final   Candida tropicalis NOT DETECTED NOT DETECTED Final   Cryptococcus neoformans/gattii NOT DETECTED NOT DETECTED Final    Comment: Performed at [facility], [address] [place], [place] [address]  Resp panel by RT-PCR (RSV, Flu A&B, Covid) Anterior Nasal Swab     Status: None   Collection Time: 03/03/24  1:12 AM   Specimen: Anterior Nasal Swab  Result Value Ref Range Status   SARS Coronavirus 2 by RT PCR NEGATIVE NEGATIVE Final   Influenza A by PCR NEGATIVE NEGATIVE Final   Influenza B by PCR NEGATIVE NEGATIVE Final    Comment: (NOTE) The Xpert Xpress SARS-CoV-2/FLU/RSV plus assay is intended as an aid in the diagnosis of influenza from Nasopharyngeal swab specimens and should not be used as a sole basis for treatment. Nasal washings and aspirates are unacceptable for Xpert Xpress SARS-CoV-2/FLU/RSV testing.  Fact Sheet for Patients: BloggerCourse.com  Fact Sheet for Healthcare Providers: SeriousBroker.it  This test is not yet approved or cleared by the [place]  FDA and has been authorized for detection and/or diagnosis of SARS-CoV-2 by FDA under an Emergency Use Authorization (EUA). This EUA will remain in effect (meaning this test can be used) for the duration of the COVID-19 declaration under Section 564(b)(1) of the Act, 21 U.S.C. section 360bbb-3(b)(1), unless the authorization is terminated or revoked.     Resp Syncytial Virus by PCR NEGATIVE NEGATIVE Final    Comment: (NOTE) Fact Sheet for Patients: BloggerCourse.com  Fact Sheet for Healthcare Providers: SeriousBroker.it  This test is not yet approved or cleared by the [place]  States FDA and has been authorized for  detection and/or diagnosis of SARS-CoV-2 by FDA under an Emergency Use Authorization (EUA). This EUA will remain in effect (meaning this test can be used) for the duration of the COVID-19 declaration under Section 564(b)(1) of the Act, 21 U.S.C. section 360bbb-3(b)(1), unless the authorization is terminated or revoked.  Performed at [facility], [address] [place], [place] [address]   Culture, blood (Routine X 2) w Reflex to ID Panel     Status: None (Preliminary result)   Collection Time: 03/06/24 10:06 AM   Specimen: BLOOD RIGHT ARM  Result Value Ref Range Status   Specimen Description BLOOD RIGHT ARM  Final   Special Requests   Final    BOTTLES DRAWN AEROBIC AND ANAEROBIC Blood Culture adequate volume   Culture   Final    NO GROWTH 3 DAYS Performed at [facility], [address] [place], [place] [address]    Report Status PENDING  Incomplete  Culture, blood (Routine X 2) w Reflex to ID Panel     Status: None (Preliminary result)   Collection Time: 03/06/24 10:06 AM   Specimen: BLOOD RIGHT HAND  Result Value Ref Range Status   Specimen Description BLOOD RIGHT HAND  Final   Special Requests   Final    BOTTLES DRAWN AEROBIC AND ANAEROBIC Blood Culture adequate volume   Culture   Final    NO GROWTH 3 DAYS Performed at [facility], [address], [place], [place] [address]    Report Status PENDING  Incomplete     Time coordinating discharge: Over 30 minutes  SIGNED:   Darcel Dawley, MD  [facility] 03/09/2024, 5:49 PM Pager   If 7PM-7AM, please contact night-coverage

## 2024-03-09 NOTE — Progress Notes (Signed)
 Mobility Specialist Progress Note:    03/09/24 1212  Mobility  Activity Ambulated with assistance in hallway  Level of Assistance Contact guard assist, steadying assist  Assistive Device Front wheel walker  Distance Ambulated (ft) 250 ft  Activity Response Tolerated well  Mobility Referral Yes  Mobility visit 1 Mobility  Mobility Specialist Start Time (ACUTE ONLY) 1129  Mobility Specialist Stop Time (ACUTE ONLY) 1140  Mobility Specialist Time Calculation (min) (ACUTE ONLY) 11 min   Received pt in bed and agreeable for mobility. No physical assistance needed. No c/o. Returned to room without fault. Left pt EOB. Personal belongings and call light within reach. All needs met. Wife present.  Lavanda Pollack Mobility Specialist  Please contact via [employer] or  Rehab Office [phone]

## 2024-03-09 NOTE — Discharge Instructions (Signed)
 Advised to follow up PCP in one week. Advised to continue ceftriaxone  for 4 weeks. Advised to remove PICC line after completion of antibiotics. Advised to follow up ID as scheduled.

## 2024-03-09 NOTE — Progress Notes (Signed)
   [address] [place] [address]             [phone]    Olin Gurski is a 58 yo male with history of Alcohol Abuse, HTN, Cirrhosis with portal hypertension, PTSD, Bipolar disorder, and C. Difficile Colitis x 3 previously.   He became ill about a month ago and was treated with PO antibiotics.  Blood cultures +.  Continued to come back to ED with fevers.  Finally had an echo which showed a possible aortic valve vegetation.  Cultures grew strep mitis, currently on ceftriaxone .  TEE 7/16 showed mobile vegetation with Aortic sclerosis but no stenosis or insufficiency.  There was a mobile vegetation noted on right cusp.  Size not reported and echo still not officially read.  BP 109/69   Pulse 77   Temp 98.7 F (37.1 C) (Oral)   Resp 17   Ht 5' 5 (1.651 m)   Wt 86 kg   SpO2 100%   BMI 31.55 kg/m  58 yo man in NAD Alert and oriented x 3 with no focal neuro deficit Cardiac RRR, + 2-3/6 systolic murmur, no diastolic murmur Lungs clear 3+ peripheral edema  I personally reviewed echo images.  The aortic valve vegetation does appear to be mobile in 1 view but linear and < 10 mm.  In my opinion the TEE finding is not an indication for surgery in Mr. Rill's case.  His surgical risk is extremely high due to his co-morbidities.  In my opinion that is much higher than the risk of embolization of the small vegetation.   I had a long discussion with Mr and Mrs Monnier and reviewed the issues involved.  They understand there is a risk of embolic event including possible stroke.  Elspeth MOTE Kerrin, MD [facility] [phone]

## 2024-03-11 LAB — CULTURE, BLOOD (ROUTINE X 2)
Culture: NO GROWTH
Culture: NO GROWTH
Special Requests: ADEQUATE
Special Requests: ADEQUATE

## 2024-03-12 LAB — ECHO TEE

## 2024-03-13 ENCOUNTER — Telehealth: Payer: Self-pay

## 2024-03-13 NOTE — Telephone Encounter (Signed)
 Received voicemail from patient wanting to know if Dr. Dennise wanted him to remain on the vancomycin .   Per 7/17 note, patient is to remain on vanc 125 mg BID while on antibiotics.   Yukari Flax, BSN, RN

## 2024-03-15 ENCOUNTER — Other Ambulatory Visit (HOSPITAL_BASED_OUTPATIENT_CLINIC_OR_DEPARTMENT_OTHER): Payer: Self-pay

## 2024-03-15 ENCOUNTER — Inpatient Hospital Stay: Admitting: Family Medicine

## 2024-03-19 ENCOUNTER — Inpatient Hospital Stay: Admitting: Family Medicine

## 2024-03-20 ENCOUNTER — Ambulatory Visit: Admitting: Infectious Diseases

## 2024-03-20 ENCOUNTER — Ambulatory Visit (INDEPENDENT_AMBULATORY_CARE_PROVIDER_SITE_OTHER): Admitting: Family Medicine

## 2024-03-20 ENCOUNTER — Encounter: Payer: Self-pay | Admitting: Family Medicine

## 2024-03-20 ENCOUNTER — Other Ambulatory Visit (HOSPITAL_BASED_OUTPATIENT_CLINIC_OR_DEPARTMENT_OTHER): Payer: Self-pay

## 2024-03-20 VITALS — BP 130/82 | HR 79 | Ht 65.0 in | Wt 180.0 lb

## 2024-03-20 DIAGNOSIS — R7881 Bacteremia: Secondary | ICD-10-CM | POA: Diagnosis not present

## 2024-03-20 DIAGNOSIS — L304 Erythema intertrigo: Secondary | ICD-10-CM

## 2024-03-20 DIAGNOSIS — I38 Endocarditis, valve unspecified: Secondary | ICD-10-CM

## 2024-03-20 HISTORY — DX: Erythema intertrigo: L30.4

## 2024-03-20 HISTORY — DX: Endocarditis, valve unspecified: I38

## 2024-03-20 MED ORDER — CLOTRIMAZOLE 1 % EX CREA
1.0000 | TOPICAL_CREAM | Freq: Two times a day (BID) | CUTANEOUS | 0 refills | Status: DC
  Filled 2024-03-20: qty 28, 14d supply, fill #0

## 2024-03-20 MED ORDER — NYSTATIN 100000 UNIT/GM EX POWD
1.0000 | Freq: Three times a day (TID) | CUTANEOUS | 1 refills | Status: AC
  Filled 2024-03-20: qty 60, 20d supply, fill #0
  Filled 2024-03-25: qty 60, 20d supply, fill #1

## 2024-03-20 NOTE — Assessment & Plan Note (Signed)
 Will treat with nystatin  powder as well as clotrimazole .  Will avoid oral antifungals for now due to his liver disease.

## 2024-03-20 NOTE — Assessment & Plan Note (Signed)
 Currently treated with ceftriaxone .  He remains on vancomycin  as well for C. difficile prophylaxis.  He has been afebrile.  He will continue 6-week course under management of infectious disease.

## 2024-03-20 NOTE — Progress Notes (Signed)
 She feels she has.  She got she got out of the truck I was like I am Juan [name] - 58 y.o. male MRN [id number]  Date of birth: 01-02-1966  Subjective Chief Complaint  Patient presents with   Fatigue    HPI Juan [name] is a 58 y.o. male here today for follow up visit.    Recently hospitalization for bacteremia and bacterial endocarditis.   He has had recurrent fevers and cultures with strep bacteremia.  TEE with likely vegetation.  He has been on rocephin  via PICC line.  Additionally, he is taking oral vancomycin  for C. difficile prevention.  Reports he is feeling pretty well.  He denies fevers or chills.  He does have continued fatigue but this seems to be improving.Juan [name]  He has held his Lasix  due to urinary frequency.  He is having some irritation in his groin including some stinging pain and itching.  Denies any bleeding or drainage.    ROS:  A comprehensive ROS was completed and negative except as noted per HPI  Allergies  Allergen Reactions   Apple Juice Swelling and Other (See Comments)    Tongue swelling   Cucumber Extract Itching and Nausea And Vomiting    No extracts; just cucumber   Depakote Er [Divalproex Sodium Er] Swelling and Other (See Comments)    Tongue swelling   Depakote [Valproic Acid] Anaphylaxis   Peanut Butter Flavoring Agent (Non-Screening) Anaphylaxis and Swelling   Peanut Oil Swelling   Peanut-Containing Drug Products Swelling   Shellfish Allergy Itching and Swelling   Shrimp Extract Itching and Swelling   Apple Swelling   Cantaloupe Extract Allergy Skin Test Rash   Codeine Itching, Rash and Other (See Comments)    Patient reports he can take CODONES without problems   Firvanq  [Vancomycin ] Rash   Lactose Intolerance (Gi) Diarrhea and Other (See Comments)    Indigestion, Stomach pain, Flatulence    Past Medical History:  Diagnosis Date   Acute bacterial sinusitis 11/15/2022   Acute pain of right knee 03/10/2023   AKI (acute kidney  injury) (HCC) 09/11/2023   Alcohol abuse 01/11/2022   Alcohol addiction (HCC)    Alcohol use disorder 05/23/2023   Alcoholic cirrhosis of liver with ascites (HCC) 02/17/2022   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  Chronic GERD with HH  09/2019--EGD--[facility], [place]--showed no varices; gr D esophagitis;  Rx: PPI daily     Eustachian tube dysfunction 03/02/2023   Febrile illness 06/27/2023   Fracture of laryngeal cartilage (HCC) 01/17/2020   Last Assessment & Plan:   Formatting of this note might be different from the original.  Emergency department follow-up for evaluation of laryngeal trauma.  CT obtained the night of the injury is reviewed independently and shows nondisplaced fracture of the thyroid  cartilage.  In general, his voice is much improved from the night of the trauma.  Denies any difficulty breathing.  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 07/11/2020   Lower extremity edema 09/06/2022   Lumbar degenerative disc disease 07/26/2022   Malnutrition of moderate degree 01/11/2022   MDD (major depressive disorder), recurrent severe, 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   Nonrheumatic aortic valve stenosis    Normocytic anemia 03/02/2022   Pain of left calf 04/11/2023   Pancytopenia (HCC) 09/18/2021   Portal hypertensive gastropathy (HCC)    Post-traumatic osteoarthritis of right knee 01/04/2023   Prolonged QT interval 05/05/2022   PTSD (post-traumatic stress disorder)    Rib pain on right side 10/31/2022   Right ankle sprain 02/12/2021   Right lower quadrant pain 04/17/2021   Right wrist injury 10/18/2023   Shoulder pain, left, posterior 12/22/2021   Systolic murmur 01/06/2021   Thrombocytopenia (HCC) 11/02/2020   Tibialis posterior tendinitis, right 04/14/2021   Tinea pedis 05/12/2021   Tobacco chew use 03/02/2022   Traumatic hemorrhagic shock (HCC)    Urinary frequency 03/23/2021   Well adult exam 01/06/2021    Past Surgical History:  Procedure Laterality Date   BIOPSY  09/24/2021   Procedure: BIOPSY;  Surgeon: Federico Rosario BROCKS, MD;  Location[facility] ENDOSCOPY;  Service: Gastroenterology;;   ORIN MEDIATE RELEASE Bilateral    COLONOSCOPY WITH PROPOFOL  N/A 09/24/2021   Procedure: COLONOSCOPY WITH PROPOFOL ;  Surgeon: Federico Rosario BROCKS, MD;  Location: [facility] ENDOSCOPY;  Service: Gastroenterology;  Laterality: N/A;   ESOPHAGOGASTRODUODENOSCOPY N/A 03/06/2024   Procedure: EGD (ESOPHAGOGASTRODUODENOSCOPY);  Surgeon: Suzann Inocente HERO, MD;   Location: [facility] ENDOSCOPY;  Service: Gastroenterology;  Laterality: N/A;   ESOPHAGOGASTRODUODENOSCOPY (EGD) WITH PROPOFOL  N/A 09/24/2021   Procedure: ESOPHAGOGASTRODUODENOSCOPY (EGD) WITH PROPOFOL ;  Surgeon: Federico Rosario BROCKS, MD;  Location: [facility] ENDOSCOPY;  Service: Gastroenterology;  Laterality: N/A;   ESOPHAGOGASTRODUODENOSCOPY (EGD) WITH PROPOFOL  N/A 08/18/2022   Procedure: ESOPHAGOGASTRODUODENOSCOPY (EGD) WITH PROPOFOL ;  Surgeon: Juan Norleen SAILOR, MD;  Location: [facility] ENDOSCOPY;  Service: Gastroenterology;  Laterality: N/A;   FLEXIBLE SIGMOIDOSCOPY N/A 08/18/2022   Procedure: FLEXIBLE SIGMOIDOSCOPY;  Surgeon: Juan Norleen SAILOR, MD;  Location: [facility] ENDOSCOPY;  Service: Gastroenterology;  Laterality: N/A;   FRACTURE SURGERY     left ankle plate   HERNIA REPAIR     inguinal   KNEE SURGERY Right    x 4   SHOULDER SURGERY Bilateral    x 2   TRANSESOPHAGEAL ECHOCARDIOGRAM (CATH LAB) N/A 03/07/2024   Procedure: TRANSESOPHAGEAL ECHOCARDIOGRAM;  Surgeon: Shlomo Wilbert SAUNDERS, MD;  Location: [facility] INVASIVE CV LAB;  Service: Cardiovascular;  Laterality: N/A;   VASECTOMY      Social History   Socioeconomic History   Marital status: Married  Spouse name: [name]   Number of children: 2   Years of education: Not on file   Highest education level: 12th grade  Occupational History    Comment: disability  Tobacco Use   Smoking status: Never   Smokeless tobacco: Current    Types: Snuff  Vaping Use   Vaping status: Never Used  Substance and Sexual Activity   Alcohol use: Not Currently   Drug use: Never   Sexual activity: Yes    Partners: Female  Other Topics Concern   Not on file  Social History Narrative   Lives with wife   Social Drivers of Health   Financial Resource Strain: Low Risk  (10/18/2023)   Overall Financial Resource Strain (CARDIA)    Difficulty of Paying Living Expenses: Not hard at all  Food Insecurity: No Food Insecurity (03/04/2024)   Hunger Vital Sign    Worried About Running Out of Food  in the Last Year: Never true    Ran Out of Food in the Last Year: Never true  Transportation Needs: No Transportation Needs (03/04/2024)   PRAPARE - Administrator, Civil Service (Medical): No    Lack of Transportation (Non-Medical): No  Physical Activity: Insufficiently Active (10/18/2023)   Exercise Vital Sign    Days of Exercise per Week: 2 days    Minutes of Exercise per Session: 60 min  Stress: Stress Concern Present (01/09/2023)   [employer] of Occupational Health - Occupational Stress Questionnaire    Feeling of Stress : To some extent  Social Connections: Unknown (10/18/2023)   Social Connection and Isolation Panel    Frequency of Communication with Friends and Family: More than three times a week    Frequency of Social Gatherings with Friends and Family: Once a week    Attends Religious Services: More than 4 times per year    Active Member of Clubs or Organizations: Patient declined    Attends Banker Meetings: Patient declined    Marital Status: Married    Family History  Problem Relation Age of Onset   Pulmonary fibrosis Mother    Hypertension Father    Other Father        liver failure   Diabetes Brother    Breast cancer Paternal Aunt    Colon cancer Neg Hx    Esophageal cancer Neg Hx    Stomach cancer Neg Hx    Rectal cancer Neg Hx     Health Maintenance  Topic Date Due   Medicare Annual Wellness (AWV)  Never done   Hepatitis B Vaccines (2 of 3 - 19+ 3-dose series) 12/02/2022   COVID-19 Vaccine (4 - 2024-25 season) 04/17/2024 (Originally 04/24/2023)   INFLUENZA VACCINE  03/23/2024   DTaP/Tdap/Td (3 - Td or Tdap) 06/20/2029   Colonoscopy  09/25/2031   Pneumococcal Vaccine 40-17 Years old  Completed   Hepatitis C Screening  Completed   HIV Screening  Completed   Zoster Vaccines- Shingrix  Completed   HPV VACCINES  Aged Out   Meningococcal B Vaccine  Aged Out      ----------------------------------------------------------------------------------------------------------------------------------------------------------------------------------------------------------------- Physical Exam BP 130/82 (BP Location: Left Arm, Patient Position: Sitting, Cuff Size: Normal)   Pulse 79   Ht 5' 5 (1.651 m)   Wt 180 lb (81.6 kg)   SpO2 100%   BMI 29.95 kg/m   Physical Exam Constitutional:      Appearance: Normal appearance.  Eyes:     General: No scleral icterus. Cardiovascular:  Rate and Rhythm: Normal rate and regular rhythm.  Pulmonary:     Effort: Pulmonary effort is normal.     Breath sounds: Normal breath sounds.  Skin:    Comments: Erythema and small amount of sloughing of skin in the groin area  Neurological:     Mental Status: He is alert.  Psychiatric:        Mood and Affect: Mood normal.        Behavior: Behavior normal.     ------------------------------------------------------------------------------------------------------------------------------------------------------------------------------------------------------------------- Assessment and Plan  Gram-positive bacteremia Currently treated with ceftriaxone .  He remains on vancomycin  as well for C. difficile prophylaxis.  He has been afebrile.  He will continue 6-week course under management of infectious disease.  Intertrigo Will treat with nystatin  powder as well as clotrimazole .  Will avoid oral antifungals for now due to his liver disease.   Meds ordered this encounter  Medications   nystatin  (MYCOSTATIN /NYSTOP ) powder    Sig: Apply 1 Application topically 3 (three) times daily for 7 days.    Dispense:  60 g    Refill:  1   clotrimazole  (LOTRIMIN  AF) 1 % cream    Sig: Apply 1 Application topically 2 (two) times daily.    Dispense:  28 g    Refill:  0    No follow-ups on file.

## 2024-03-20 NOTE — Patient Instructions (Signed)
 Try nystatin  powder and clotrimazole  powder

## 2024-03-21 ENCOUNTER — Inpatient Hospital Stay: Payer: Self-pay | Admitting: Internal Medicine

## 2024-03-21 ENCOUNTER — Other Ambulatory Visit: Payer: Self-pay

## 2024-03-21 ENCOUNTER — Ambulatory Visit: Payer: Self-pay | Admitting: Internal Medicine

## 2024-03-21 ENCOUNTER — Encounter: Payer: Self-pay | Admitting: Internal Medicine

## 2024-03-21 ENCOUNTER — Telehealth: Payer: Self-pay

## 2024-03-21 ENCOUNTER — Encounter: Admitting: Family Medicine

## 2024-03-21 VITALS — BP 136/76 | HR 81 | Temp 98.1°F | Wt 177.0 lb

## 2024-03-21 DIAGNOSIS — R7881 Bacteremia: Secondary | ICD-10-CM | POA: Diagnosis not present

## 2024-03-21 DIAGNOSIS — B954 Other streptococcus as the cause of diseases classified elsewhere: Secondary | ICD-10-CM | POA: Diagnosis not present

## 2024-03-21 DIAGNOSIS — A0471 Enterocolitis due to Clostridium difficile, recurrent: Secondary | ICD-10-CM | POA: Diagnosis not present

## 2024-03-21 NOTE — Telephone Encounter (Signed)
 Per Dr. Dennise will need to extend ceftriaxone  2 weeks. End date 8/25. Secure chat forwarded to [employer] team.    Per pharmacy protocol Ceftriaxone  2 gm IV Q 24 hours  Duration: 2 weeks extension End Date: 8/25 [facility] Care Per Protocol with [employer] Use: Home health RN for IV administration and teaching, line care and labs.     Labs weekly while on IV antibiotics: _x_ CBC with differential __ BMP **TWICE WEEKLY ON VANCOMYCIN   _x_ CMP _x_ CRP _x_ ESR __ Vancomycin  trough TWICE WEEKLY __ CK   __ Please pull PIC at completion of IV antibiotics x__ Please leave PIC in place until doctor has seen patient or been notified   Fax weekly labs to [phone]

## 2024-03-21 NOTE — Progress Notes (Signed)
 Patient: Juan [name]  DOB: 06-May-1966 MRN: [id number] PCP: Alvia Bring, DO      Patient Active Problem List   Diagnosis Date Noted   Endocarditis, valve unspecified 03/20/2024   Intertrigo 03/20/2024   Acute bacterial endocarditis 03/07/2024   Abnormal finding on GI tract imaging 03/03/2024   Gram-positive bacteremia 02/26/2024   Sepsis (HCC) 02/15/2024   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) 01/23/2024   Right wrist pain 01/23/2024   Alcoholic polyneuropathy (HCC) 93/97/7974   Moderate aortic stenosis 11/17/2023   Obesity (BMI 30.0-34.9) 11/17/2023   Alcohol addiction (HCC)    Cirrhosis (HCC)    Colon polyps    Depression    Hemochromatosis    Hx of blood clots    Hyperreflexia    Hypertension    Traumatic hemorrhagic shock (HCC)    Right wrist injury 10/18/2023   AKI (acute kidney injury) (HCC) 09/11/2023   Febrile illness 06/27/2023   Bipolar 2 disorder, major depressive episode (HCC) 05/23/2023   Alcohol use disorder 05/23/2023   Arthritis 05/23/2023   Pain of left calf 04/11/2023   Chest trauma 03/18/2023   Acute pain of right knee 03/10/2023   Eustachian tube dysfunction 03/02/2023   Breast nodule 01/12/2023   Post-traumatic osteoarthritis of right knee 01/04/2023   Acute bacterial sinusitis 11/15/2022   Rib pain on right side 10/31/2022   Head injury 10/31/2022   Bilateral lower extremity edema 10/12/2022   Epistaxis 09/14/2022   Anasarca 09/14/2022   Hematemesis 09/13/2022   Lower extremity edema 09/06/2022   Hyperbilirubinemia 08/17/2022   Anxiety 08/17/2022   GI bleed 08/16/2022   Lumbar degenerative disc disease 07/26/2022   Prolonged QT interval 05/05/2022   Anemia, chronic disease 05/05/2022   Hyponatremia 03/02/2022   GERD (gastroesophageal reflux disease) 03/02/2022   Normocytic anemia 03/02/2022   Tobacco chew use 03/02/2022   Hiatal hernia 02/20/2022   Alcoholic cirrhosis of liver with ascites (HCC)  02/17/2022   DDD (degenerative disc disease), cervical 01/22/2022   Hypokalemia 01/11/2022   Hypomagnesemia 01/11/2022   Malnutrition of moderate degree 01/11/2022   Nonrheumatic aortic valve stenosis    Shoulder pain, left, posterior 12/22/2021   Portal hypertensive gastropathy (HCC)    Pancytopenia (HCC) 09/18/2021   Tinea pedis 05/12/2021   Diverticulosis 04/20/2021   Right lower quadrant pain 04/17/2021   Tibialis posterior tendinitis, right 04/14/2021   Dizziness 03/23/2021   Urinary frequency 03/23/2021   Right ankle sprain 02/12/2021   Well adult exam 01/06/2021   Systolic murmur 01/06/2021   Thrombocytopenia (HCC) 11/02/2020   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 07/11/2020   Allergic reaction 07/08/2020   History of alcohol abuse 05/20/2020   Impingement syndrome, shoulder, right 05/20/2020   Fracture of laryngeal cartilage (HCC) 01/17/2020   Esophagitis, [place] grade D 12/05/2019   PTSD (post-traumatic stress disorder) 04/16/2019   MDD (major depressive disorder), recurrent severe, 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   History of bilateral inguinal hernia repair 01/19/2019   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 difficile x 3 in the past 2 years followed by infectious disease given history strep para sanguinous bacteremia call back to ED due to positive blood cultures presents for hospital follow-up of strep sanguinous and mitis/oralis bacteremia.  Blood culture at outside facility on 6/18 grew strep para sanguinous, patient did not stay overnight and was discharged he received 1 dose IV antibiotics and then cefdinir .  Seen by PCP on 6/30 with blood cultures no growth he has been having fevers  seen in ID clinic on 7/10 was plan was for treat with 6 weeks of IV penicillin given concern for endocarditis.  Blood cultures from 7/19 PCPs office were positive and patient called back. - Blood cultures from admission on 7/12 grew strep mitis 1 out of 2 sets and step para sanguinous 1 out of 2 sepsis.  Orthopantogram negative.  CT abdomen pelvis on admission showed acute pancreatitis, possible gallstone pericarditis or cholecystitis.  MRCP showed possible cholecystitis.  General surgery consulted no plans for OR.  TEE showed shaggy mobile dense aortic valve with hypermobile septum.  CT surgery with no plans for OR.  Discharged on ceftriaxone  x 4 weeks EOT 8/12.  Also given p.o. Vanco twice daily given C. difficile history.  Outpatient GI follow-up pending Today 03/21/2024: no missed doses. No gi issues. Some chang ein change, wihtout change in po intake   Review of Systems  All other systems reviewed and are negative.   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     Allergic reaction 07/08/2020   Anasarca 09/14/2022   Anemia of chronic disease 05/05/2022   Anxiety    Arthritis 05/23/2023   Bacteremia due to Enterococcus 09/21/2023   Benign essential hypertension 12/21/2016   Bilateral lower extremity edema 10/12/2022   Bipolar 2 disorder, major depressive episode (HCC) 05/23/2023   Bleeding internal hemorrhoids 08/18/2022   Breast nodule 01/12/2023   Chest trauma 03/18/2023   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   Chronic fatigue    Cirrhosis (HCC)    Colon polyps    DDD (degenerative disc  disease), cervical 01/22/2022   Depression    Diverticulosis 04/20/2021   Dizziness 03/23/2021   Epistaxis 09/14/2022   Esophagitis, [place] grade D 12/05/2019   Formatting of this note might be different from the original.  Chronic GERD with HH  09/2019--EGD--[facility], [place]--showed no varices; gr D esophagitis;  Rx: PPI daily     Eustachian tube dysfunction 03/02/2023   Febrile illness 06/27/2023   Fracture of laryngeal cartilage (HCC) 01/17/2020   Last Assessment & Plan:   Formatting of this note might be different from the original.  Emergency department follow-up for evaluation of laryngeal trauma.  CT obtained the night of the injury is reviewed independently and shows nondisplaced fracture of the thyroid  cartilage.  In general, his voice is much improved from the night of the trauma.  Denies any difficulty breathing.  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 07/11/2020   Lower extremity edema 09/06/2022   Lumbar degenerative disc disease 07/26/2022   Malnutrition of moderate degree 01/11/2022   MDD (major depressive disorder), recurrent severe, 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   Nonrheumatic aortic valve stenosis    Normocytic anemia 03/02/2022   Pain of left calf 04/11/2023   Pancytopenia (HCC) 09/18/2021   Portal hypertensive gastropathy (HCC)    Post-traumatic osteoarthritis of  right knee 01/04/2023   Prolonged QT interval 05/05/2022   PTSD (post-traumatic stress disorder)    Rib pain on right side 10/31/2022   Right ankle sprain 02/12/2021   Right lower quadrant pain 04/17/2021   Right wrist injury 10/18/2023   Shoulder pain, left, posterior 12/22/2021   Systolic murmur 01/06/2021   Thrombocytopenia (HCC) 11/02/2020   Tibialis posterior tendinitis, right 04/14/2021   Tinea pedis 05/12/2021   Tobacco chew use 03/02/2022   Traumatic hemorrhagic shock (HCC)    Urinary frequency 03/23/2021   Well adult exam 01/06/2021    Outpatient Medications Prior to Visit  Medication Sig Dispense Refill   calcium  carbonate (TUMS - DOSED IN MG ELEMENTAL CALCIUM ) 500 MG chewable tablet Chew 3 tablets by mouth at bedtime.     cefTRIAXone  (ROCEPHIN ) IVPB Inject 2 g into the vein daily for 26 days. Indication:  Strep endocarditis First Dose: Yes Last Day of Therapy:  04/03/24 Labs - Once weekly:  CBC/D and BMP, Labs - Once weekly: ESR and CRP Method of administration: IV Push Method of administration may be changed at the discretion of home infusion pharmacist based upon assessment of the patient and/or caregiver's ability to self-administer the medication ordered. 26 Units 0   clotrimazole  (LOTRIMIN  AF) 1 % cream Apply 1 Application topically 2 (two) times daily. 28 g 0   ferrous sulfate  325 (65 FE) MG tablet Take 1 tablet (325 mg total) by mouth daily with breakfast. 100 tablet 0   furosemide  (LASIX ) 40 MG tablet Take 40 mg by mouth daily.     lactulose , encephalopathy, (CHRONULAC ) 10 GM/15ML SOLN Take 10 g by mouth daily as needed (constipation).     nystatin  (MYCOSTATIN /NYSTOP ) powder Apply 1 Application topically 3 (three) times daily for 7 days. 60 g 1   ondansetron  (ZOFRAN -ODT) 4 MG disintegrating tablet Take 1 tablet (4 mg total) by mouth every 8 (eight) hours as needed. 20 tablet 0   pantoprazole  (PROTONIX ) 40 MG tablet Take 1 tablet (40 mg total) by mouth daily. 30  tablet 0   QUEtiapine  (SEROQUEL ) 50 MG tablet Take 0.5-1 tablets (25-50 mg total) by mouth at bedtime. (Patient taking differently: Take 50 mg by mouth at bedtime.) 90 tablet 1   spironolactone  (ALDACTONE ) 100 MG tablet Take 1 tablet (100 mg total) by mouth daily. 90 tablet 1   vancomycin  (VANCOCIN ) 125 MG capsule Take 1 capsule (125 mg total) by mouth in the morning and at bedtime. 60 capsule 1   No facility-administered medications prior to visit.     Allergies  Allergen Reactions   Apple Juice Swelling and Other (See Comments)    Tongue swelling   Cucumber Extract Itching and Nausea And Vomiting    No extracts; just cucumber   Depakote Er [Divalproex Sodium Er] Swelling and Other (See Comments)    Tongue swelling   Depakote [Valproic Acid] Anaphylaxis   Peanut  Butter Flavoring Agent (Non-Screening) Anaphylaxis and Swelling   Peanut Oil Swelling   Peanut-Containing Drug Products Swelling   Shellfish Allergy Itching and Swelling   Shrimp Extract Itching and Swelling   Apple Swelling   Cantaloupe Extract Allergy Skin Test Rash   Codeine Itching, Rash and Other (See Comments)    Patient reports he can take CODONES without problems   Firvanq  [Vancomycin ] Rash   Lactose Intolerance (Gi) Diarrhea and Other (See Comments)    Indigestion, Stomach pain, Flatulence    Social History   Tobacco Use   Smoking status: Never   Smokeless tobacco: Current    Types: Snuff  Vaping Use   Vaping status: Never Used  Substance Use Topics   Alcohol use: Not Currently   Drug use: Never    Family History  Problem Relation Age of Onset   Pulmonary fibrosis Mother    Hypertension Father    Other Father        liver failure   Diabetes Brother    Breast cancer Paternal Aunt    Colon cancer Neg Hx    Esophageal cancer Neg Hx    Stomach cancer Neg Hx    Rectal cancer Neg Hx     Objective:  There were no vitals filed for this visit. There is no height or weight on file to calculate  BMI.  Physical Exam Constitutional:      General: He is not in acute distress.    Appearance: He is normal weight. He is not toxic-appearing.  HENT:     Head: Normocephalic and atraumatic.     Right Ear: External ear normal.     Left Ear: External ear normal.     Nose: No congestion or rhinorrhea.     Mouth/Throat:     Mouth: Mucous membranes are moist.     Pharynx: Oropharynx is clear.  Eyes:     Extraocular Movements: Extraocular movements intact.     Conjunctiva/sclera: Conjunctivae normal.     Pupils: Pupils are equal, round, and reactive to light.  Cardiovascular:     Rate and Rhythm: Normal rate and regular rhythm.     Heart sounds: No murmur heard.    No friction rub. No gallop.  Pulmonary:     Effort: Pulmonary effort is normal.     Breath sounds: Normal breath sounds.  Abdominal:     General: Abdomen is flat. Bowel sounds are normal.     Palpations: Abdomen is soft.  Musculoskeletal:        General: No swelling. Normal range of motion.     Cervical back: Normal range of motion and neck supple.  Skin:    General: Skin is warm and dry.  Neurological:     General: No focal deficit present.     Mental Status: He is oriented to person, place, and time.  Psychiatric:        Mood and Affect: Mood normal.     Lab Results: Lab Results  Component Value Date   WBC 7.1 03/09/2024   HGB 7.7 (L) 03/09/2024   HCT 24.3 (L) 03/09/2024   MCV 88.4 03/09/2024   PLT 66 (L) 03/09/2024    Lab Results  Component Value Date   CREATININE 0.82 03/09/2024   BUN 5 (L) 03/09/2024   NA 136 03/09/2024   K 3.3 (L) 03/09/2024   CL 111 03/09/2024   CO2 20 (L) 03/09/2024    Lab Results  Component Value Date   ALT 30 03/05/2024  AST 62 (H) 03/05/2024   ALKPHOS 86 03/05/2024   BILITOT 3.6 (H) 03/05/2024     Assessment & Plan:  #Strep paradanguinis and mitis/oralis bacteremia #History of C. Difficile #Possible gallstone pancreatitis vs cholecysttiis -Blood culture at outside  facility on 6/18 grew strep para sanguinous patient got 1 dose IV antibiotics and addition of cefdinir  and did not stay overnight as blood cultures returned later time - Seen by PCP on 6/30 blood cultures no growth.  Continue to have fevers defer to infectious disease - Seen in ID clinic on 7/10 with plan for IV antibiotics x 6 weeks empiric endocarditis coverage - Blood cultures on 7/9 grew GPC.  Returned back.  Blood cultures on that came back as strep para sanguinous - Repeat blood culture due to hospitalization 7/12 grew 1/2 aspirant strep mitis/oralis in 1/wrist sprain segments.  Of note workup including CT abdomen pelvis showed acute pancreatitis, possible gallstone pancreatitis versus cholecystitis.  MRCP showed possible cholecystitis.  General surgery with no plans for OR.  Patient has GI follow-up.  I suspect possible GI source for bacteremia given new species on stone/12 with Septra mitis/oralis and negative orthopantogram.  Of note he did have dental procedure prior to hospitalization on 6/18. - TEE showed shaggy mobile density on aortic valve. - Patient discharged on ceftriaxone  x 4 weeks EOT 8/12 extedn till 8/24 to complete 6 weeks - Has GI follow-up outpatient on 8/15  #medication management #PIC line -labs 7/29: esr 9, scr 0.75, wbc 5.2 -pull picc after last dose of abx on 8/24 -some issues with taste in chagnge, pt able to tolerate po.  -F/U in one month  #Recurrent C. difficile - Patient placed on p.o. vancomycin  for secondary prophylaxis while on antibiotics and 7 days out    OPAT ORDERS:   Diagnosis: strep bacteremia Endocarditis       Allergies       Allergies  Allergen Reactions   Apple Juice Swelling and Other (See Comments)      Tongue swelling   Cucumber Extract Itching and Nausea And Vomiting      No extracts; just cucumber   Depakote Er [Divalproex Sodium Er] Swelling and Other (See Comments)      Tongue swelling   Depakote [Valproic Acid] Anaphylaxis    Peanut Butter Flavoring Agent (Non-Screening) Anaphylaxis and Swelling   Peanut Oil Swelling   Peanut-Containing Drug Products Swelling   Shellfish Allergy Itching and Swelling   Shrimp Extract Itching and Swelling   Apple Swelling   Cantaloupe Extract Allergy Skin Test Rash   Codeine Itching, Rash and Other (See Comments)      Patient reports he can take CODONES without problems   Firvanq  [Vancomycin ] Rash   Lactose Intolerance (Gi) Diarrhea and Other (See Comments)      Indigestion, Stomach pain, Flatulence          Discharge antibiotics to be given via PICC line:   Per pharmacy protocol Ceftriaxone  2 gm IV Q 24 hours      Duration: 2 weeks extension End Date: 8/24   [facility] Care Per Protocol with [employer]op[employer] Use: Home health RN for IV administration and teaching, line care and labs.     Labs weekly while on IV antibiotics: _x_ CBC with differential __ BMP **TWICE WEEKLY ON VANCOMYCIN   _x_ CMP _x_ CRP _x_ ESR __ Vancomycin  trough TWICE WEEKLY __ CK   __ Please pull PIC at completion of IV antibiotics x__ Please leave PIC in place until doctor has seen  patient or been notified   Fax weekly labs to [phone]   Clinic Follow Up Appt: One month   @ [facility] with Dr. dennise Loney dennise, MD [facility] for Infectious Disease [facility]   03/21/24  5:30 AM I have personally spent 69 minutes involved in face-to-face and non-face-to-face activities for this patient on the day of the visit. Professional time spent includes the following activities: Preparing to see the patient (review of tests), Obtaining and/or reviewing separately obtained history (admission/discharge record), Performing a medically appropriate examination and/or evaluation , Ordering medications/tests/procedures, referring and communicating with other health care professionals, Documenting clinical information in the EMR, Independently interpreting results (not separately reported),  Communicating results to the patient/family/caregiver, Counseling and educating the patient/family/caregiver and Care coordination (not separately reported).

## 2024-03-26 ENCOUNTER — Other Ambulatory Visit (HOSPITAL_BASED_OUTPATIENT_CLINIC_OR_DEPARTMENT_OTHER): Payer: Self-pay

## 2024-03-28 ENCOUNTER — Other Ambulatory Visit: Payer: Self-pay

## 2024-04-03 ENCOUNTER — Encounter: Payer: Self-pay | Admitting: Family Medicine

## 2024-04-03 ENCOUNTER — Other Ambulatory Visit (HOSPITAL_BASED_OUTPATIENT_CLINIC_OR_DEPARTMENT_OTHER): Payer: Self-pay

## 2024-04-03 ENCOUNTER — Ambulatory Visit (INDEPENDENT_AMBULATORY_CARE_PROVIDER_SITE_OTHER): Admitting: Family Medicine

## 2024-04-03 VITALS — BP 126/76 | HR 82 | Resp 20 | Ht 65.0 in | Wt 184.0 lb

## 2024-04-03 DIAGNOSIS — Z0000 Encounter for general adult medical examination without abnormal findings: Secondary | ICD-10-CM | POA: Diagnosis not present

## 2024-04-03 MED ORDER — PANTOPRAZOLE SODIUM 40 MG PO TBEC
40.0000 mg | DELAYED_RELEASE_TABLET | Freq: Every day | ORAL | 0 refills | Status: DC
  Filled 2024-04-03: qty 90, 90d supply, fill #0

## 2024-04-03 NOTE — Patient Instructions (Signed)
 Fall Prevention in the Home, Adult Falls can cause injuries and can happen to people of all ages. There are many things you can do to make your home safer and to help prevent falls. What actions can I take to prevent falls? General information Use good lighting in all rooms. Make sure to: Replace any light bulbs that burn out. Turn on the lights in dark areas and use night-lights. Keep items that you use often in easy-to-reach places. Lower the shelves around your home if needed. Move furniture so that there are clear paths around it. Do not use throw rugs or other things on the floor that can make you trip. If any of your floors are uneven, fix them. Add color or contrast paint or tape to clearly mark and help you see: Grab bars or handrails. First and last steps of staircases. Where the edge of each step is. If you use a ladder or stepladder: Make sure that it is fully opened. Do not climb a closed ladder. Make sure the sides of the ladder are locked in place. Have someone hold the ladder while you use it. Know where your pets are as you move through your home. What can I do in the bathroom?     Keep the floor dry. Clean up any water on the floor right away. Remove soap buildup in the bathtub or shower. Buildup makes bathtubs and showers slippery. Use non-skid mats or decals on the floor of the bathtub or shower. Attach bath mats securely with double-sided, non-slip rug tape. If you need to sit down in the shower, use a non-slip stool. Install grab bars by the toilet and in the bathtub and shower. Do not use towel bars as grab bars. What can I do in the bedroom? Make sure that you have a light by your bed that is easy to reach. Do not use any sheets or blankets on your bed that hang to the floor. Have a firm chair or bench with side arms that you can use for support when you get dressed. What can I do in the kitchen? Clean up any spills right away. If you need to reach something  above you, use a step stool with a grab bar. Keep electrical cords out of the way. Do not use floor polish or wax that makes floors slippery. What can I do with my stairs? Do not leave anything on the stairs. Make sure that you have a light switch at the top and the bottom of the stairs. Make sure that there are handrails on both sides of the stairs. Fix handrails that are broken or loose. Install non-slip stair treads on all your stairs if they do not have carpet. Avoid having throw rugs at the top or bottom of the stairs. Choose a carpet that does not hide the edge of the steps on the stairs. Make sure that the carpet is firmly attached to the stairs. Fix carpet that is loose or worn. What can I do on the outside of my home? Use bright outdoor lighting. Fix the edges of walkways and driveways and fix any cracks. Clear paths of anything that can make you trip, such as tools or rocks. Add color or contrast paint or tape to clearly mark and help you see anything that might make you trip as you walk through a door, such as a raised step or threshold. Trim any bushes or trees on paths to your home. Check to see if handrails are loose  or broken and that both sides of all steps have handrails. Install guardrails along the edges of any raised decks and porches. Have leaves, snow, or ice cleared regularly. Use sand, salt, or ice melter on paths if you live where there is ice and snow during the winter. Clean up any spills in your garage right away. This includes grease or oil spills. What other actions can I take? Review your medicines with your doctor. Some medicines can cause dizziness or changes in blood pressure, which increase your risk of falling. Wear shoes that: Have a low heel. Do not wear high heels. Have rubber bottoms and are closed at the toe. Feel good on your feet and fit well. Use tools that help you move around if needed. These include: Canes. Walkers. Scooters. Crutches. Ask  your doctor what else you can do to help prevent falls. This may include seeing a physical therapist to learn to do exercises to move better and get stronger. Where to find more information Centers for Disease Control and Prevention, STEAD[employer]: TonerPromos.no [employer] on Aging: BaseRingTones.pl National Institute on Aging: BaseRingTones.pl Contact a doctor if: You are afraid of falling at home. You feel weak, drowsy, or dizzy at home. You fall at home. Get help right away if you: Lose consciousness or have trouble moving after a fall. Have a fall that causes a head injury. These symptoms may be an emergency. Get help right away. Call 911. Do not wait to see if the symptoms will go away. Do not drive yourself to the hospital. This information is not intended to replace advice given to you by your health care provider. Make sure you discuss any questions you have with your health care provider. Document Revised: 04/12/2022 Document Reviewed: 04/12/2022 Elsevier Patient Education  2024 Elsevier Inc. Health Maintenance, Male Adopting a healthy lifestyle and getting preventive care are important in promoting health and wellness. Ask your health care provider about: The right schedule for you to have regular tests and exams. Things you can do on your own to prevent diseases and keep yourself healthy. What should I know about diet, weight, and exercise? Eat a healthy diet  Eat a diet that includes plenty of vegetables, fruits, low-fat dairy products, and lean protein. Do not eat a lot of foods that are high in solid fats, added sugars, or sodium. Maintain a healthy weight Body mass index (BMI) is a measurement that can be used to identify possible weight problems. It estimates body fat based on height and weight. Your health care provider can help determine your BMI and help you achieve or maintain a healthy weight. Get regular exercise Get regular exercise. This is one of the most important things you  can do for your health. Most adults should: Exercise for at least 150 minutes each week. The exercise should increase your heart rate and make you sweat (moderate-intensity exercise). Do strengthening exercises at least twice a week. This is in addition to the moderate-intensity exercise. Spend less time sitting. Even light physical activity can be beneficial. Watch cholesterol and blood lipids Have your blood tested for lipids and cholesterol at 56 years of age, then have this test every 5 years. You may need to have your cholesterol levels checked more often if: Your lipid or cholesterol levels are high. You are older than 56 years of age. You are at high risk for heart disease. What should I know about cancer screening? Many types of cancers can be detected early and may often  be prevented. Depending on your health history and family history, you may need to have cancer screening at various ages. This may include screening for: Colorectal cancer. Prostate cancer. Skin cancer. Lung cancer. What should I know about heart disease, diabetes, and high blood pressure? Blood pressure and heart disease High blood pressure causes heart disease and increases the risk of stroke. This is more likely to develop in people who have high blood pressure readings or are overweight. Talk with your health care provider about your target blood pressure readings. Have your blood pressure checked: Every 3-5 years if you are 85-62 years of age. Every year if you are 56 years old or older. If you are between the ages of 19 and 28 and are a current or former smoker, ask your health care provider if you should have a one-time screening for abdominal aortic aneurysm (AAA). Diabetes Have regular diabetes screenings. This checks your fasting blood sugar level. Have the screening done: Once every three years after age 40 if you are at a normal weight and have a low risk for diabetes. More often and at a younger age  if you are overweight or have a high risk for diabetes. What should I know about preventing infection? Hepatitis B If you have a higher risk for hepatitis B, you should be screened for this virus. Talk with your health care provider to find out if you are at risk for hepatitis B infection. Hepatitis C Blood testing is recommended for: Everyone born from [birth year] through 1963. Anyone with known risk factors for hepatitis C. Sexually transmitted infections (STIs) You should be screened each year for STIs, including gonorrhea and chlamydia, if: You are sexually active and are younger than 56 years of age. You are older than 56 years of age and your health care provider tells you that you are at risk for this type of infection. Your sexual activity has changed since you were last screened, and you are at increased risk for chlamydia or gonorrhea. Ask your health care provider if you are at risk. Ask your health care provider about whether you are at high risk for HIV. Your health care provider may recommend a prescription medicine to help prevent HIV infection. If you choose to take medicine to prevent HIV, you should first get tested for HIV. You should then be tested every 3 months for as long as you are taking the medicine. Follow these instructions at home: Alcohol use Do not drink alcohol if your health care provider tells you not to drink. If you drink alcohol: Limit how much you have to 0-2 drinks a day. Know how much alcohol is in your drink. In the [place] one drink equals one 12 oz bottle of beer (355 mL), one 5 oz glass of wine (148 mL), or one 1 oz glass of hard liquor (44 mL). Lifestyle Do not use any products that contain nicotine or tobacco. These products include cigarettes, chewing tobacco, and vaping devices, such as e-cigarettes. If you need help quitting, ask your health care provider. Do not use street drugs. Do not share needles. Ask your health care provider for help if you need  support or information about quitting drugs. General instructions Schedule regular health, dental, and eye exams. Stay current with your vaccines. Tell your health care provider if: You often feel depressed. You have ever been abused or do not feel safe at home. Summary Adopting a healthy lifestyle and getting preventive care are important in promoting health and  wellness. Follow your health care provider's instructions about healthy diet, exercising, and getting tested or screened for diseases. Follow your health care provider's instructions on monitoring your cholesterol and blood pressure. This information is not intended to replace advice given to you by your health care provider. Make sure you discuss any questions you have with your health care provider. Document Revised: 12/29/2020 Document Reviewed: 12/29/2020 Elsevier Patient Education  2024 [employer].

## 2024-04-03 NOTE — Progress Notes (Signed)
 Subjective:    Juan [name] is a 58 y.o. male who presents for a Welcome to Medicare exam.         Objective:    Today's Vitals   04/03/24 1440  BP: 126/76  Pulse: 82  Resp: 20  SpO2: 97%  Weight: 184 lb (83.5 kg)  Height: 5' 5 (1.651 m)   Body mass index is 30.62 kg/m.  Medications Outpatient Encounter Medications as of 04/03/2024  Medication Sig   calcium  carbonate (TUMS - DOSED IN MG ELEMENTAL CALCIUM ) 500 MG chewable tablet Chew 3 tablets by mouth at bedtime.   cefTRIAXone  (ROCEPHIN ) IVPB Inject 2 g into the vein daily for 26 days. Indication:  Strep endocarditis First Dose: Yes Last Day of Therapy:  04/03/24 Labs - Once weekly:  CBC/D and BMP, Labs - Once weekly: ESR and CRP Method of administration: IV Push Method of administration may be changed at the discretion of home infusion pharmacist based upon assessment of the patient and/or caregiver's ability to self-administer the medication ordered.   clotrimazole  (LOTRIMIN  AF) 1 % cream Apply 1 Application topically 2 (two) times daily.   ferrous sulfate  325 (65 FE) MG tablet Take 1 tablet (325 mg total) by mouth daily with breakfast.   furosemide  (LASIX ) 40 MG tablet Take 40 mg by mouth daily.   lactulose , encephalopathy, (CHRONULAC ) 10 GM/15ML SOLN Take 10 g by mouth daily as needed (constipation).   nystatin  (MYCOSTATIN /NYSTOP ) powder Apply 1 Application topically 3 (three) times daily for 7 days.   ondansetron  (ZOFRAN -ODT) 4 MG disintegrating tablet Take 1 tablet (4 mg total) by mouth every 8 (eight) hours as needed.   QUEtiapine  (SEROQUEL ) 50 MG tablet Take 0.5-1 tablets (25-50 mg total) by mouth at bedtime. (Patient taking differently: Take 50 mg by mouth at bedtime.)   spironolactone  (ALDACTONE ) 100 MG tablet Take 1 tablet (100 mg total) by mouth daily.   vancomycin  (VANCOCIN ) 125 MG capsule Take 1 capsule (125 mg total) by mouth in the morning and at bedtime.   [DISCONTINUED] pantoprazole  (PROTONIX ) 40 MG  tablet Take 1 tablet (40 mg total) by mouth daily.   pantoprazole  (PROTONIX ) 40 MG tablet Take 1 tablet (40 mg total) by mouth daily.   No facility-administered encounter medications on file as of 04/03/2024.     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  Chronic GERD with HH  09/2019--EGD--[facility], [place]--showed no varices; gr D esophagitis;  Rx: PPI daily     Eustachian tube dysfunction 03/02/2023   Febrile illness 06/27/2023   Fracture of laryngeal cartilage (HCC) 01/17/2020   Last Assessment & Plan:   Formatting of this note might be different from the original.  Emergency department follow-up for evaluation of laryngeal trauma.  CT obtained the night of the injury is reviewed independently and shows nondisplaced fracture of  the thyroid  cartilage.  In general, his voice is much improved from the night of the trauma.  Denies any difficulty breathing.  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 07/11/2020   Lower extremity edema 09/06/2022   Lumbar degenerative disc disease 07/26/2022   Malnutrition of moderate degree 01/11/2022   MDD (major depressive disorder), recurrent severe, 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   Nonrheumatic aortic valve stenosis    Normocytic anemia 03/02/2022   Pain of left calf 04/11/2023   Pancytopenia (HCC) 09/18/2021   Portal hypertensive gastropathy (HCC)    Post-traumatic osteoarthritis of right knee 01/04/2023   Prolonged QT interval 05/05/2022   PTSD (post-traumatic stress disorder)    Rib pain on right side 10/31/2022   Right ankle sprain 02/12/2021   Right lower quadrant pain 04/17/2021   Right wrist injury 10/18/2023   Shoulder pain, left, posterior 12/22/2021   Systolic murmur 01/06/2021   Thrombocytopenia (HCC) 11/02/2020   Tibialis posterior tendinitis, right 04/14/2021   Tinea pedis 05/12/2021   Tobacco chew use 03/02/2022   Traumatic hemorrhagic shock (HCC)    Urinary frequency 03/23/2021   Well adult exam 01/06/2021   Past Surgical History:  Procedure Laterality Date   BIOPSY  09/24/2021   Procedure: BIOPSY;  Surgeon: Federico Rosario BROCKS, MD;  Location: [facility] ENDOSCOPY;  Service:  Gastroenterology;;   ORIN MEDIATE RELEASE Bilateral    COLONOSCOPY WITH PROPOFOL  N/A 09/24/2021   Procedure: COLONOSCOPY WITH PROPOFOL ;  Surgeon: Federico Rosario BROCKS, MD;  Location: [facility] ENDOSCOPY;  Service: Gastroenterology;  Laterality: N/A;   ESOPHAGOGASTRODUODENOSCOPY N/A 03/06/2024   Procedure: EGD (ESOPHAGOGASTRODUODENOSCOPY);  Surgeon: Suzann Inocente HERO, MD;  Location: [facility] ENDOSCOPY;  Service: Gastroenterology;  Laterality: N/A;   ESOPHAGOGASTRODUODENOSCOPY (EGD) WITH PROPOFOL  N/A 09/24/2021   Procedure: ESOPHAGOGASTRODUODENOSCOPY (EGD) WITH PROPOFOL ;  Surgeon: Federico Rosario BROCKS, MD;  Location: [facility] ENDOSCOPY;  Service: Gastroenterology;  Laterality: N/A;   ESOPHAGOGASTRODUODENOSCOPY (EGD) WITH PROPOFOL  N/A 08/18/2022   Procedure: ESOPHAGOGASTRODUODENOSCOPY (EGD) WITH PROPOFOL ;  Surgeon: Abran Norleen SAILOR, MD;  Location: [facility] ENDOSCOPY;  Service: Gastroenterology;  Laterality: N/A;   FLEXIBLE SIGMOIDOSCOPY N/A 08/18/2022   Procedure: FLEXIBLE SIGMOIDOSCOPY;  Surgeon: Abran Norleen SAILOR, MD;  Location: [facility] ENDOSCOPY;  Service: Gastroenterology;  Laterality: N/A;   FRACTURE SURGERY     left ankle plate   HERNIA REPAIR     inguinal   KNEE SURGERY Right    x  4   SHOULDER SURGERY Bilateral    x 2   TRANSESOPHAGEAL ECHOCARDIOGRAM (CATH LAB) N/A 03/07/2024   Procedure: TRANSESOPHAGEAL ECHOCARDIOGRAM;  Surgeon: Shlomo Wilbert SAUNDERS, MD;  Location: [facility] INVASIVE CV LAB;  Service: Cardiovascular;  Laterality: N/A;   VASECTOMY      Family History  Problem Relation Age of Onset   Pulmonary fibrosis Mother    Hypertension Father    Other Father        liver failure   Diabetes Brother    Breast cancer Paternal Aunt    Colon cancer Neg Hx    Esophageal cancer Neg Hx    Stomach cancer Neg Hx    Rectal cancer Neg Hx    Social History   Occupational History    Comment: disability  Tobacco Use   Smoking status: Never   Smokeless tobacco: Current    Types: Snuff  Vaping Use   Vaping status: Never Used  Substance and  Sexual Activity   Alcohol use: Not Currently   Drug use: Never   Sexual activity: Yes    Partners: Female    Tobacco Counseling Ready to quit: Not Answered Counseling given: Not Answered   Immunizations and Health Maintenance Immunization History  Administered Date(s) Administered   Hepatitis B, ADULT 11/04/2022   Influenza,inj,Quad PF,6+ Mos 04/18/2019, 05/05/2020   Influenza-Unspecified 05/26/2018   Moderna Sars-Covid-2 Vaccination 11/03/2020   PFIZER(Purple Top)SARS-COV-2 Vaccination 11/12/2019, 12/03/2019   PNEUMOCOCCAL CONJUGATE-20 10/27/2021   Tdap 02/06/2017, 06/21/2019   Zoster Recombinant(Shingrix) 12/30/2017, 03/01/2018, 04/17/2021   Health Maintenance Due  Topic Date Due   Hepatitis B Vaccines (2 of 3 - 19+ 3-dose series) 12/02/2022    Activities of Daily Living    04/03/2024    2:52 PM 03/05/2024    1:19 PM  In your present state of health, do you have any difficulty performing the following activities:  Hearing? 1 0  Vision? 0 0  Difficulty concentrating or making decisions? 1 0  Walking or climbing stairs? 1   Dressing or bathing? 1   Doing errands, shopping? 0 0    Physical Exam   Physical Exam (optional), or other factors deemed appropriate based on the beneficiary's medical and social history and current clinical standards.   Advanced Directives:     EKG:  Deferred.  Has had multiple EKG's in the past month     Assessment:    This is a routine wellness  examination for this patient .   Vision/Hearing screen Vision Screening   Right eye Left eye Both eyes  Without correction     With correction 20/20 20/70 20/25      Goals   None      Depression Screen    10/31/2023    9:37 AM 10/26/2023    9:47 AM 05/12/2023    2:13 PM 01/12/2023   10:43 AM  PHQ 2/9 Scores  PHQ - 2 Score 0 0 0 2  PHQ- 9 Score   7 4     Fall Risk    04/03/2024    2:51 PM  Fall Risk   Falls in the past year? 1  Number falls in past yr: 1  Injury with Fall?  0  Risk for fall due to : History of fall(s);Impaired balance/gait;Impaired mobility  Follow up Falls evaluation completed    Cognitive Function        04/03/2024    2:53 PM  6CIT Screen  What Year? 0 points  What month? 0 points  What time? 0 points  Count back from 20 0 points  Months in reverse 0 points  Repeat phrase 2 points  Total Score 2 points    Patient Care Team: Alvia Bring, DO as PCP - General (Family Medicine)     Plan:   Welcome to Medicare Visit  I have personally reviewed and noted the following in the patient's chart:   Medical and social history Use of alcohol, tobacco or illicit drugs  Current medications and supplements including opioid prescriptions. Patient is not currently taking opioid prescriptions. Functional ability and status Nutritional status Physical activity Advanced directives List of other physicians Hospitalizations, surgeries, and ER visits in previous 12 months Vitals Screenings to include cognitive, depression, and falls Referrals and appointments  In addition, I have reviewed and discussed with patient certain preventive protocols, quality metrics, and best practice recommendations. A written personalized care plan for preventive services as well as general preventive health recommendations were provided to patient.     Bring Alvia, DO 04/03/2024

## 2024-04-05 NOTE — Progress Notes (Signed)
 04/06/2024 Glendia Alas [id number] [birth year]  Referring provider: Alvia Bring, DO Primary GI doctor: Dr. Federico  ASSESSMENT AND PLAN:  Cirrhosis secondary to alcohol 03/09/2024 WBC 7.1 HGB 7.7 Platelets 66 03/05/2024 AST 62 ALT 30 Alkphos 86 TBili 3.6 03/05/2024 INR 2.4 MELD 3.0: 23 at 03/07/2024  5:57 AM MELD-Na: 21 at 03/07/2024  5:57 AM Calculated from: Serum Creatinine: 0.87 mg/dL (Using min of 1 mg/dL) at 2/83/7974  4:42 AM Serum Sodium: 137 mmol/L at 03/07/2024  5:57 AM Total Bilirubin: 3.6 mg/dL at 2/85/7974  3:62 AM Serum Albumin: 1.9 g/dL at 2/85/7974  3:62 AM INR(ratio): 2.4 at 03/05/2024 12:09 PM Age at listing (hypothetical): 58 years Sex: Male at 03/07/2024  5:57 AM  Ascites:      Non on exam, patient on spironolactone  100 mg daily and lasix  as needed, has not taken in 2 weeks.  -Nutrition and low sodium diet discussed with patient and information given - appears euvolemic Varices screening / surveillance EGD:     Last EGD 02/2024 No history of varices. Has had some elevated BP/HR, consider adding on coreg. Recall EGD 2 years unless patient has worsening anemia/melena.   Hepatic encephalopathy:  Pt does not report any symptoms consistent with HE and no asterixis on exam.  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  Possibly  gallstone pancreatitis however patient had no abdominal pain nausea or vomiting very high risk for surgery with gram-positive bacterial endocarditis and bacteremia, recommended IR place PERC drain Check PETH to see if patient is still drinking as possible source of pancreatitis  Bacteremia secondary to gram-positive with associated acute bacterial endocarditis Status post PICC line 7/16 Continue follow up with cardio/ID  Acute on chronic normocytic anemia 03/09/2024  HGB 7.7 MCV 88.4 Platelets 66 03/03/2024 Iron 23 Ferritin 41 B12 3,360 Recent Labs    02/20/24 2159 02/23/24 1551 02/27/24 1457 02/29/24 1544 03/03/24 0101 03/03/24 0505 03/04/24 0903 03/05/24 0637 03/07/24 0557 03/09/24 0308  HGB 7.7* 8.0* 8.6* 8.0* 7.6* 6.7* 8.1* 8.7* 8.2* 7.7*  Likely due to anemia of chronic disease as well as cirrhosis with portal gastropathy and GAVE, had 1 unit PRBC during hospitalization EGD no varices but did show GAVE and portal gastropathy no black stools, blood in stool.  Feb 2023 colonoscopy On iron supplement once a day Recheck iron May need in hospital APC for GAVE if worsening anemia or not responsive to iron, ER precautions discussed  Patient Care Team: Alvia Bring, DO as PCP - General (Family Medicine)  HISTORY OF PRESENT ILLNESS: 58 y.o. male with medical history significant for several admissions for pneumonia, bacteremia, alcoholic cirrhosis, myopathy, hypertension, portal hypertension, alcohol abuse, C. difficile, PTSD, bipolar disorder, GERD presents for follow up of cirrhosis secondary to ETOH. Has history of the following complications from cirrhosis: portal hypertensive gastropathy, ascites, thrombocytopenia, and hepatic encephalopathy  Patient had recent hospitalization  7/12 through 7/18 for gram-positive bacteremia and acute bacterial endocarditis.   TEE showed possible vegetation aortic valve.  Still getting ABX at home with PICC line, rocephin  2 grams.   Last EGD  was 03/06/2024 during hospitalization showed no varices did show GAVE, portal hypertensive gastropathy.  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  History of Present Illness   Gordan Grell is a 58 year old male with cirrhosis and recent bacteremia who presents for follow-up after hospitalization.  He was hospitalized from July 12th to July 18th for bacteremia, which possibly affected his heart, leading to vegetation. He currently has a PICC line and receives Rocephin  infusions every night, with bandage changes and infusions managed by a home health nurse. He is on a regimen of two grams of Rocephin .  During his hospitalization, an endoscopy was performed due to low hemoglobin levels. 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 He is being monitored for potential blood loss due to these conditions. No black stools or blood in the stool.  An MRCP showed pancreatitis, a pancreatic cyst, and gallstones with gallbladder wall thickening. No right upper quadrant pain, shoulder blade pain, nausea, or vomiting. He has a history of cirrhosis and is at risk for liver cancer, with regular imaging scheduled every six  months. No alcohol consumption and no family history of pancreatitis.  He is currently taking Lasix  as needed, pantoprazole  40 mg once daily, spironolactone  100 mg daily, and iron once daily. He has not taken Lasix  in the past two weeks due to minimal swelling in his feet. He reports a change in taste, with everything tasting like pepper, and ice cream being the only food that tastes good. He follows a low sodium diet and takes lactulose  as needed, approximately once a month.  He experiences muscle wasting, particularly in the arm with the PICC line. He has protein shakes available but has not been consuming them regularly. He reports eating a high-protein diet with meals from a mail-order service.  He experiences itching, especially at night, and has tried Benadryl  with limited success. He is cautious with the dosage due to concurrent Seroquel  use. He reports fatigue and mild jaundice, but no shortness of breath or chest pain. He denies smoking and has no recent alcohol use.       Social history:  He  reports that he has never smoked. His smokeless tobacco use includes snuff. He reports that he does not currently use alcohol. He reports that he does not use drugs.  RELEVANT GI HISTORY, LABS, IMAGING:  CBC    Component Value Date/Time   WBC 7.1 03/09/2024 0308   RBC 2.75 (L) 03/09/2024 0308   HGB 7.7 (L) 03/09/2024 0308   HGB 8.0 (L) 02/29/2024  1544   HCT 24.3 (L) 03/09/2024 0308   HCT 24.7 (L) 02/29/2024 1544   PLT 66 (L) 03/09/2024 0308   PLT 66 (LL) 02/29/2024 1544   MCV 88.4 03/09/2024 0308   MCV 82 02/29/2024 1544   MCH 28.0 03/09/2024 0308   MCHC 31.7 03/09/2024 0308   RDW 22.6 (H) 03/09/2024 0308   RDW 20.6 (H) 02/29/2024 1544   LYMPHSABS 0.4 (L) 03/03/2024 0101   LYMPHSABS 1.0 02/29/2024 1544   MONOABS 0.7 03/03/2024 0101   EOSABS 0.1 03/03/2024 0101   EOSABS 0.1 02/29/2024 1544   BASOSABS 0.0 03/03/2024 0101   BASOSABS 0.0 02/29/2024 1544   Recent Labs     02/20/24 2159 02/23/24 1551 02/27/24 1457 02/29/24 1544 03/03/24 0101 03/03/24 0505 03/04/24 0903 03/05/24 0637 03/07/24 0557 03/09/24 0308  HGB 7.7* 8.0* 8.6* 8.0* 7.6* 6.7* 8.1* 8.7* 8.2* 7.7*    CMP     Component Value Date/Time   NA 136 03/09/2024 0308   NA 132 (L) 02/29/2024 1544   K 3.3 (L) 03/09/2024 0308   CL 111 03/09/2024 0308   CO2 20 (L) 03/09/2024 0308   GLUCOSE 100 (H) 03/09/2024 0308   BUN 5 (L) 03/09/2024 0308   BUN 16 02/29/2024 1544   CREATININE 0.82 03/09/2024 0308   CREATININE 0.63 (L) 10/01/2022 1148   CALCIUM  6.7 (L) 03/09/2024 0308   PROT 5.1 (L) 03/05/2024 0637   PROT 5.5 (L) 02/29/2024 1544   ALBUMIN 1.9 (L) 03/05/2024 0637   ALBUMIN 2.7 (L) 02/29/2024 1544   AST 62 (H) 03/05/2024 0637   ALT 30 03/05/2024 0637   ALKPHOS 86 03/05/2024 0637   BILITOT 3.6 (H) 03/05/2024 0637   BILITOT 4.4 (H) 02/29/2024 1544   GFRNONAA >60 03/09/2024 0308   GFRNONAA 101 01/06/2021 0000   GFRAA 117 01/06/2021 0000      Latest Ref Rng & Units 03/05/2024    6:37 AM 03/03/2024    5:05 AM 03/03/2024    1:01 AM  Hepatic Function  Total Protein 6.5 - 8.1 g/dL 5.1  4.4  4.5   Albumin 3.5 - 5.0 g/dL 1.9  1.6  1.7   AST 15 - 41 U/L 62  61  61   ALT 0 - 44 U/L 30  26  25    Alk Phosphatase 38 - 126 U/L 86  61  66   Total Bilirubin 0.0 - 1.2 mg/dL 3.6  4.3  3.7       Current Medications:    Current Outpatient Medications (Cardiovascular):    furosemide  (LASIX ) 40 MG tablet, Take 40 mg by mouth daily.   spironolactone  (ALDACTONE ) 100 MG tablet, Take 1 tablet (100 mg total) by mouth daily.    Current Outpatient Medications (Hematological):    ferrous sulfate  325 (65 FE) MG tablet, Take 1 tablet (325 mg total) by mouth daily with breakfast.  Current Outpatient Medications (Other):    calcium  carbonate (TUMS - DOSED IN MG ELEMENTAL CALCIUM ) 500 MG chewable tablet, Chew 3 tablets by mouth at bedtime.   clotrimazole  (LOTRIMIN  AF) 1 % cream, Apply 1 Application  topically 2 (two) times daily.   hydrOXYzine  (ATARAX ) 10 MG tablet, Take 1 tablet (10 mg total) by mouth every 8 (eight) hours as needed for itching (1- 2 pills for itching).   lactulose , encephalopathy, (CHRONULAC ) 10 GM/15ML SOLN, Take 10 g by mouth daily as needed (constipation).   nystatin  (MYCOSTATIN /NYSTOP ) powder, Apply 1 Application topically 3 (three) times daily for 7 days.   ondansetron  (  ZOFRAN -ODT) 4 MG disintegrating tablet, Take 1 tablet (4 mg total) by mouth every 8 (eight) hours as needed.   pantoprazole  (PROTONIX ) 40 MG tablet, Take 1 tablet (40 mg total) by mouth daily.   QUEtiapine  (SEROQUEL ) 50 MG tablet, Take 0.5-1 tablets (25-50 mg total) by mouth at bedtime. (Patient taking differently: Take 50 mg by mouth at bedtime.)   vancomycin  (VANCOCIN ) 125 MG capsule, Take 1 capsule (125 mg total) by mouth in the morning and at bedtime.  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  Chronic GERD with HH  09/2019--EGD--[facility], [place]--showed no varices; gr D esophagitis;  Rx: PPI daily     Eustachian tube dysfunction 03/02/2023   Febrile illness 06/27/2023   Fracture of laryngeal cartilage (HCC) 01/17/2020   Last Assessment & Plan:   Formatting of this note might be different from the original.  Emergency department follow-up for evaluation of laryngeal trauma.  CT obtained the night of the injury is reviewed independently and shows nondisplaced fracture of the thyroid  cartilage.  In general, his voice is much improved from the night of the trauma.  Denies any difficulty breathing.  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 07/11/2020   Lower extremity edema 09/06/2022   Lumbar degenerative disc disease 07/26/2022   Malnutrition of moderate degree 01/11/2022   MDD (major depressive disorder), recurrent severe, 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   Nonrheumatic aortic valve stenosis    Normocytic anemia 03/02/2022   Pain of left calf 04/11/2023   Pancytopenia (HCC) 09/18/2021   Portal hypertensive gastropathy (HCC)    Post-traumatic osteoarthritis of right knee 01/04/2023   Prolonged QT interval  05/05/2022   PTSD (post-traumatic stress disorder)  Rib pain on right side 10/31/2022   Right ankle sprain 02/12/2021   Right lower quadrant pain 04/17/2021   Right wrist injury 10/18/2023   Shoulder pain, left, posterior 12/22/2021   Systolic murmur 01/06/2021   Thrombocytopenia (HCC) 11/02/2020   Tibialis posterior tendinitis, right 04/14/2021   Tinea pedis 05/12/2021   Tobacco chew use 03/02/2022   Traumatic hemorrhagic shock (HCC)    Urinary frequency 03/23/2021   Well adult exam 01/06/2021   Allergies:  Allergies  Allergen Reactions   Apple Juice Swelling and Other (See Comments)    Tongue swelling   Cucumber Extract Itching and Nausea And Vomiting    No extracts; just cucumber   Depakote Er [Divalproex Sodium Er] Swelling and Other (See Comments)    Tongue swelling   Depakote [Valproic Acid] Anaphylaxis   Peanut Butter Flavoring Agent (Non-Screening) Anaphylaxis and Swelling   Peanut Oil Swelling   Peanut-Containing Drug Products Swelling   Shellfish Allergy Itching and Swelling   Shrimp Extract Itching and Swelling   Apple Swelling   Cantaloupe Extract Allergy Skin Test Rash   Codeine Itching, Rash and Other (See Comments)    Patient reports he can take CODONES without problems   Firvanq  [Vancomycin ] Rash    Pt is currently taking, pt can take in low doses but not high doses he turns red all over   Lactose Intolerance (Gi) Diarrhea and Other (See Comments)    Indigestion, Stomach pain, Flatulence     Surgical History:  He  has a past surgical history that includes Hernia repair; Vasectomy; Fracture surgery; Knee surgery (Right); Shoulder surgery (Bilateral); Carpal tunnel release (Bilateral); Esophagogastroduodenoscopy (egd) with propofol  (N/A, 09/24/2021); Colonoscopy with propofol  (N/A, 09/24/2021); biopsy (09/24/2021); Esophagogastroduodenoscopy (egd) with propofol  (N/A, 08/18/2022); Flexible sigmoidoscopy (N/A, 08/18/2022); Esophagogastroduodenoscopy (N/A,  03/06/2024); and TRANSESOPHAGEAL ECHOCARDIOGRAM (N/A, 03/07/2024). Family History:  His family history includes Breast cancer in his paternal aunt; Diabetes in his brother; Hypertension in his father; Other in his father; Pulmonary fibrosis in his mother.  REVIEW OF SYSTEMS  : All other systems reviewed and negative except where noted in the History of Present Illness.  PHYSICAL EXAM: BP (!) 150/70 (BP Location: Left Arm, Patient Position: Sitting, Cuff Size: Normal)   Pulse 84   Ht 5' 5 (1.651 m)   Wt 181 lb 4 oz (82.2 kg)   BMI 30.16 kg/m  Physical Exam   GENERAL APPEARANCE: chronically ill appearing, in no apparent distress. HEENT: No cervical lymphadenopathy, unremarkable thyroid , sclerae anicteric, conjunctiva pink. RESPIRATORY: Respiratory effort normal, lungs clear to auscultation bilaterally, breath sounds equal without rales, rhonchi, or wheezing. CARDIO: Regular rate and rhythm with a systolic murmur present, peripheral pulses intact. ABDOMEN: Soft, non-distended, active bowel sounds in all four quadrants, no tenderness to palpation, no rebound, no mass appreciated. RECTAL: Declines. MUSCULOSKELETAL: Full range of motion, gait not tested, walks with walker, without edema. SKIN: Dry, intact without rashes or lesions. + jaundice. NEURO: Alert, oriented, no focal deficits.  PSYCH: Cooperative, normal mood and affect.      Alan JONELLE Coombs, PA-C 10:23 AM

## 2024-04-06 ENCOUNTER — Other Ambulatory Visit (HOSPITAL_BASED_OUTPATIENT_CLINIC_OR_DEPARTMENT_OTHER): Payer: Self-pay

## 2024-04-06 ENCOUNTER — Encounter: Payer: Self-pay | Admitting: Physician Assistant

## 2024-04-06 ENCOUNTER — Ambulatory Visit: Admitting: Physician Assistant

## 2024-04-06 ENCOUNTER — Other Ambulatory Visit (INDEPENDENT_AMBULATORY_CARE_PROVIDER_SITE_OTHER)

## 2024-04-06 VITALS — BP 150/70 | HR 84 | Ht 65.0 in | Wt 181.2 lb

## 2024-04-06 DIAGNOSIS — K859 Acute pancreatitis without necrosis or infection, unspecified: Secondary | ICD-10-CM | POA: Diagnosis not present

## 2024-04-06 DIAGNOSIS — K7030 Alcoholic cirrhosis of liver without ascites: Secondary | ICD-10-CM | POA: Diagnosis not present

## 2024-04-06 DIAGNOSIS — D509 Iron deficiency anemia, unspecified: Secondary | ICD-10-CM

## 2024-04-06 DIAGNOSIS — K8020 Calculus of gallbladder without cholecystitis without obstruction: Secondary | ICD-10-CM | POA: Diagnosis not present

## 2024-04-06 DIAGNOSIS — D696 Thrombocytopenia, unspecified: Secondary | ICD-10-CM

## 2024-04-06 DIAGNOSIS — K3189 Other diseases of stomach and duodenum: Secondary | ICD-10-CM

## 2024-04-06 DIAGNOSIS — I330 Acute and subacute infective endocarditis: Secondary | ICD-10-CM

## 2024-04-06 DIAGNOSIS — D638 Anemia in other chronic diseases classified elsewhere: Secondary | ICD-10-CM

## 2024-04-06 DIAGNOSIS — R7881 Bacteremia: Secondary | ICD-10-CM

## 2024-04-06 DIAGNOSIS — F1010 Alcohol abuse, uncomplicated: Secondary | ICD-10-CM

## 2024-04-06 DIAGNOSIS — G621 Alcoholic polyneuropathy: Secondary | ICD-10-CM | POA: Diagnosis not present

## 2024-04-06 DIAGNOSIS — B9689 Other specified bacterial agents as the cause of diseases classified elsewhere: Secondary | ICD-10-CM

## 2024-04-06 DIAGNOSIS — K766 Portal hypertension: Secondary | ICD-10-CM

## 2024-04-06 LAB — COMPREHENSIVE METABOLIC PANEL WITH GFR
ALT: 22 U/L (ref 0–53)
AST: 52 U/L — ABNORMAL HIGH (ref 0–37)
Albumin: 2.7 g/dL — ABNORMAL LOW (ref 3.5–5.2)
Alkaline Phosphatase: 97 U/L (ref 39–117)
BUN: 10 mg/dL (ref 6–23)
CO2: 24 meq/L (ref 19–32)
Calcium: 8.9 mg/dL (ref 8.4–10.5)
Chloride: 104 meq/L (ref 96–112)
Creatinine, Ser: 0.87 mg/dL (ref 0.40–1.50)
GFR: 96.46 mL/min (ref >60.00)
Glucose, Bld: 98 mg/dL (ref 70–99)
Potassium: 4.2 meq/L (ref 3.5–5.1)
Sodium: 134 meq/L — ABNORMAL LOW (ref 135–145)
Total Bilirubin: 3.6 mg/dL — ABNORMAL HIGH (ref 0.2–1.2)
Total Protein: 6.5 g/dL (ref 6.0–8.3)

## 2024-04-06 LAB — CBC WITH DIFFERENTIAL/PLATELET
Basophils Absolute: 0 K/uL (ref 0.0–0.1)
Basophils Relative: 0.6 % (ref 0.0–3.0)
Eosinophils Absolute: 0.4 K/uL (ref 0.0–0.7)
Eosinophils Relative: 6.3 % — ABNORMAL HIGH (ref 0.0–5.0)
HCT: 33.3 % — ABNORMAL LOW (ref 39.0–52.0)
Hemoglobin: 11.2 g/dL — ABNORMAL LOW (ref 13.0–17.0)
Lymphocytes Relative: 24.1 % (ref 12.0–46.0)
Lymphs Abs: 1.6 K/uL (ref 0.7–4.0)
MCHC: 33.5 g/dL (ref 30.0–36.0)
MCV: 89.1 fl (ref 78.0–100.0)
Monocytes Absolute: 0.8 K/uL (ref 0.1–1.0)
Monocytes Relative: 11.9 % (ref 3.0–12.0)
Neutro Abs: 3.9 K/uL (ref 1.4–7.7)
Neutrophils Relative %: 57.1 % (ref 43.0–77.0)
Platelets: 87 K/uL — ABNORMAL LOW (ref 150.0–400.0)
RBC: 3.74 Mil/uL — ABNORMAL LOW (ref 4.22–5.81)
RDW: 24.1 % — ABNORMAL HIGH (ref 11.5–15.5)
WBC: 6.8 K/uL (ref 4.0–10.5)

## 2024-04-06 LAB — LIPASE: Lipase: 198 U/L — ABNORMAL HIGH (ref 11.0–59.0)

## 2024-04-06 LAB — IBC + FERRITIN
Ferritin: 30.5 ng/mL (ref 22.0–322.0)
Iron: 64 ug/dL (ref 42–165)
Saturation Ratios: 22.3 % (ref 20.0–50.0)
TIBC: 287 ug/dL (ref 250.0–450.0)
Transferrin: 205 mg/dL — ABNORMAL LOW (ref 212.0–360.0)

## 2024-04-06 LAB — AMYLASE: Amylase: 78 U/L (ref 27–131)

## 2024-04-06 LAB — PROTIME-INR
INR: 2 ratio — ABNORMAL HIGH (ref 0.8–1.0)
Prothrombin Time: 20.5 s — ABNORMAL HIGH (ref 9.6–13.1)

## 2024-04-06 MED ORDER — HYDROXYZINE HCL 10 MG PO TABS
10.0000 mg | ORAL_TABLET | Freq: Three times a day (TID) | ORAL | 0 refills | Status: DC | PRN
Start: 2024-04-06 — End: 2024-04-17
  Filled 2024-04-06: qty 30, 10d supply, fill #0

## 2024-04-06 NOTE — Patient Instructions (Signed)
Your provider has requested that you go to the basement level for lab work before leaving today. Press "B" on the elevator. The lab is located at the first door on the left as you exit the elevator.  TYLENOL (ACETAMINOPHEN) IS SAFE IN LIVER DISEASE: You can take tylenol (acetaminophen) up to 2,000 mg/day. This would be four extra strength ('500mg'$ ) tablets over 24 hours OR six regular strength (325 mg) tablets over 24 hours. Please be sure to read the ingredients of over the counter medications and prescription pain medications as many contain acetaminophen.   NO NSAIDS (ibuprofen, advil, naproxen, aleve, motrin...)  HEPATIC ENCEPHALOPATHY HEPATIC ENCEPHALOPATHY: Confusion caused by a build up of toxins in the blood due to the liver not being able to filter toxins. This can cause confusion mild or severe, increase falls. If you have been diagnosed with Hepatic encephalopathy, advised to not drive due to increased risk   LACTULOSE:  helps pull ammonia and other toxins from the blood into your stool when you have a bowel movement NO NEED TO CHECK AMMONIA LEVEL IN BLOOD! Only need to check if you are having symptoms. 30-71m up to four times a day Take a dose in the morning If by lunch time you have not had AT LEAST 2 bowel movements take another dose If by dinner you have not had AT LEAST 2 bowel movements that day take another dose If by bedtime you still have not had 2 bowel movements take another dose Goal of 3-4 bowel movements per day Avoid taking with food as this will cause more gas  IF YOU ARE VERY SLEEPY, HARD FOR YOUR FAMILY TO WAKE YOU UP, OR FALLING ASLEEP DURING CONVERSATIONS -INCREASE LACTULOSE/GO TO THE EMERGENCY ROOM  XIFAXAN (rifaximin) An antibiotic that helps limit excess bacteria in the intestine.  The bacteria can cause increased ammonia One '550mg'$  tablet twice daily  SUPPLEMENTS: multivitamin Zinc Branch Chain Amino acids (BCAA): 12 grams/day L-Ornithine  L-Aspartate (LOLA): 6 grams three times/day ([email or web address] L-Carnitine  PHYSICAL ACTIVITY It is important to continue to be active when you have cirrhosis. Exercise will help reduce muscle loss and weakness.  DISCUSS REFERRAL FOR PHYSICAL THERAPY WITH YOUR PRIMARY CARE PROVIDER  DIET/NUTRITION FOR CIRRHOSIS NO ALCOHOL YOUR GOALS Evening snack - high protein Supplements between meals to help meet calorie and protein goal: Boost Ensure Premier Protein Shakes Protein Greek yogurt Fish, chicken (NO RAW OR UNDERCOOKED FISH/SHELLFISH) Avoid pork and red meat Plant based protein (non-soy)/Vegan: Lentils, Chickpeas, Peanuts (non salted), almonds (non salted), quinoa, chia seeds  Plant based protein supplements (not soy)  Avoid/limit animal based protein supplements: whey, casein 4.  Low sodium (2,000 mg/day) A. Avoid: table salt, canned foods, deli meats, sausages, hot dogs, anything with a long shelf life B. Read nutrition labels and be aware of serving size. Don't go by percent of daily value.

## 2024-04-06 NOTE — Progress Notes (Signed)
 I agree with the assessment and plan as outlined by Ms. Craig.

## 2024-04-09 ENCOUNTER — Ambulatory Visit: Payer: Self-pay | Admitting: Physician Assistant

## 2024-04-09 LAB — AFP TUMOR MARKER: AFP-Tumor Marker: 10.5 ng/mL — ABNORMAL HIGH (ref <6.1)

## 2024-04-11 LAB — PHOSPHATIDYLETHANOL (PETH)
Phosphatidylethanol (PEth): NEGATIVE ng/mL
Phosphatidylethanol: NEGATIVE

## 2024-04-13 ENCOUNTER — Ambulatory Visit (INDEPENDENT_AMBULATORY_CARE_PROVIDER_SITE_OTHER): Admitting: Urgent Care

## 2024-04-13 ENCOUNTER — Encounter: Payer: Self-pay | Admitting: Urgent Care

## 2024-04-13 VITALS — BP 142/79 | HR 88 | Resp 18 | Ht 65.0 in | Wt 186.8 lb

## 2024-04-13 DIAGNOSIS — N62 Hypertrophy of breast: Secondary | ICD-10-CM | POA: Diagnosis not present

## 2024-04-13 NOTE — Progress Notes (Signed)
 Established Patient Office Visit  Subjective:  Patient ID: Juan [name], male    DOB: 1966/02/11  Age: 58 y.o. MRN: [id number]  Chief Complaint  Patient presents with   Mass    Pt states it is on both sides on his upper chest very painful noticed about 1 wk ago    HPI  Discussed the use of AI scribe software for clinical note transcription with the patient, who gave verbal consent to proceed.  History of Present Illness   Juan [name] is a 58 year old male who presents with painful lumps in his breasts.  He noticed lumps in his breasts about a week ago, which have progressively worsened. The lumps are painful. He recalls having a similar issue two years ago but did not follow through with the scheduled evaluation at that time. There is no redness of the skin or discharge from the nipples. He does not believe the veins on his chest are new. He denies swelling in his upper chest or neck. He did just start spironolactone  about a month ago and is currently taking spironolactone  100mg  daily. Does have hx of alcohol abuse but has been etoh free now for some time.  He is currently receiving treatment through a PICC line, which is scheduled to be removed on Monday. He has been on the PICC line for six weeks. His current medications include oral vancomycin  and ceftriaxone , administered four times a week, which he has completed for six weeks. He is scheduled to see infectious disease again on the 28th of this month.       Patient Active Problem List   Diagnosis Date Noted   Endocarditis, valve unspecified 03/20/2024   Intertrigo 03/20/2024   Acute bacterial endocarditis 03/07/2024   Abnormal finding on GI tract imaging 03/03/2024   Gram-positive bacteremia 02/26/2024   Sepsis (HCC) 02/15/2024   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) 01/23/2024   Right wrist pain 01/23/2024   Alcoholic polyneuropathy (HCC) 93/97/7974   Moderate aortic stenosis 11/17/2023    Obesity (BMI 30.0-34.9) 11/17/2023   Alcohol addiction (HCC)    Cirrhosis (HCC)    Colon polyps    Depression    Hemochromatosis    Hx of blood clots    Hyperreflexia    Hypertension    Traumatic hemorrhagic shock (HCC)    Right wrist injury 10/18/2023   AKI (acute kidney injury) (HCC) 09/11/2023   Febrile illness 06/27/2023   Bipolar 2 disorder, major depressive episode (HCC) 05/23/2023   Alcohol use disorder 05/23/2023   Arthritis 05/23/2023   Pain of left calf 04/11/2023   Chest trauma 03/18/2023   Acute pain of right knee 03/10/2023   Eustachian tube dysfunction 03/02/2023   Breast nodule 01/12/2023   Post-traumatic osteoarthritis of right knee 01/04/2023   Acute bacterial sinusitis 11/15/2022   Rib pain on right side 10/31/2022   Head injury 10/31/2022   Bilateral lower extremity edema 10/12/2022   Epistaxis 09/14/2022   Anasarca 09/14/2022   Hematemesis 09/13/2022   Lower extremity edema 09/06/2022   Hyperbilirubinemia 08/17/2022   Anxiety 08/17/2022   GI bleed 08/16/2022   Lumbar degenerative disc disease 07/26/2022   Prolonged QT interval 05/05/2022   Anemia, chronic disease 05/05/2022   Hyponatremia 03/02/2022   GERD (gastroesophageal reflux disease) 03/02/2022   Normocytic anemia 03/02/2022   Tobacco chew use 03/02/2022   Hiatal hernia 02/20/2022   Alcoholic cirrhosis of liver with ascites (HCC) 02/17/2022   DDD (degenerative disc disease), cervical 01/22/2022  Hypokalemia 01/11/2022   Hypomagnesemia 01/11/2022   Malnutrition of moderate degree 01/11/2022   Nonrheumatic aortic valve stenosis    Shoulder pain, left, posterior 12/22/2021   Portal hypertensive gastropathy (HCC)    Pancytopenia (HCC) 09/18/2021   Tinea pedis 05/12/2021   Diverticulosis 04/20/2021   Right lower quadrant pain 04/17/2021   Tibialis posterior tendinitis, right 04/14/2021   Dizziness 03/23/2021   Urinary frequency 03/23/2021   Right ankle sprain 02/12/2021   Well adult exam  01/06/2021   Systolic murmur 01/06/2021   Thrombocytopenia (HCC) 11/02/2020   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 07/11/2020   Allergic reaction 07/08/2020   History of alcohol abuse 05/20/2020   Impingement syndrome, shoulder, right 05/20/2020   Fracture of laryngeal cartilage (HCC) 01/17/2020   Esophagitis, [place] grade D 12/05/2019   PTSD (post-traumatic stress disorder) 04/16/2019   MDD (major depressive disorder), recurrent severe, 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   History of bilateral inguinal hernia repair 01/19/2019   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     Allergic reaction 07/08/2020   Allergy    Anasarca 09/14/2022   Anemia of chronic disease 05/05/2022   Anxiety    Arthritis 05/23/2023   Bacteremia due to Enterococcus 09/21/2023   Benign essential hypertension 12/21/2016   Bilateral lower extremity edema 10/12/2022   Bipolar 2 disorder, major depressive episode (HCC) 05/23/2023   Bleeding internal hemorrhoids 08/18/2022   Breast nodule 01/12/2023   Chest trauma 03/18/2023   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   Chronic fatigue    Cirrhosis (HCC)    Colon polyps    DDD (degenerative disc disease), cervical 01/22/2022   Depression    Diverticulosis 04/20/2021   Dizziness 03/23/2021   Epistaxis 09/14/2022   Esophagitis, [place] grade D 12/05/2019    Formatting of this note might be different from the original.  Chronic GERD with HH  09/2019--EGD--[facility], [place]--showed no varices; gr D esophagitis;  Rx: PPI daily     Eustachian tube dysfunction 03/02/2023   Febrile illness 06/27/2023   Fracture of laryngeal cartilage (HCC) 01/17/2020   Last Assessment & Plan:   Formatting of this note might be different from the original.  Emergency department follow-up for evaluation of laryngeal trauma.  CT obtained the night of the injury is reviewed independently and shows nondisplaced fracture of the thyroid  cartilage.  In general, his voice is much improved from the night of the trauma.  Denies any difficulty breathing.  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 07/11/2020   Lower extremity edema 09/06/2022   Lumbar degenerative disc disease 07/26/2022   Malnutrition of moderate degree 01/11/2022   MDD (major depressive disorder), recurrent severe, 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   Nonrheumatic aortic valve stenosis    Normocytic anemia 03/02/2022   Pain of left calf 04/11/2023   Pancytopenia (HCC) 09/18/2021   Portal hypertensive gastropathy (HCC)    Post-traumatic osteoarthritis of right knee 01/04/2023   Prolonged QT interval 05/05/2022   PTSD (post-traumatic stress disorder)    Rib pain on right side 10/31/2022   Right ankle sprain 02/12/2021    Right lower quadrant pain 04/17/2021   Right wrist injury 10/18/2023   Shoulder pain, left, posterior 12/22/2021   Sleep apnea    Substance abuse (HCC)    Systolic murmur 01/06/2021   Thrombocytopenia (HCC) 11/02/2020   Tibialis posterior tendinitis, right 04/14/2021   Tinea pedis 05/12/2021   Tobacco chew use 03/02/2022   Traumatic hemorrhagic shock (HCC)    Ulcer    Urinary frequency 03/23/2021   Well adult exam 01/06/2021   Past Surgical History:  Procedure Laterality Date   BIOPSY  09/24/2021   Procedure: BIOPSY;  Surgeon: Federico Rosario BROCKS, MD;  Location: [facility] ENDOSCOPY;  Service: Gastroenterology;;   ORIN MEDIATE RELEASE Bilateral    COLONOSCOPY WITH PROPOFOL  N/A 09/24/2021   Procedure: COLONOSCOPY WITH PROPOFOL ;  Surgeon: Federico Rosario BROCKS, MD;  Location: [facility] ENDOSCOPY;  Service: Gastroenterology;  Laterality: N/A;   ESOPHAGOGASTRODUODENOSCOPY N/A 03/06/2024   Procedure: EGD (ESOPHAGOGASTRODUODENOSCOPY);  Surgeon: Suzann Inocente HERO, MD;  Location: [facility] ENDOSCOPY;  Service: Gastroenterology;  Laterality: N/A;   ESOPHAGOGASTRODUODENOSCOPY (EGD) WITH PROPOFOL  N/A 09/24/2021   Procedure: ESOPHAGOGASTRODUODENOSCOPY (EGD) WITH PROPOFOL ;  Surgeon: Federico Rosario BROCKS, MD;  Location: [facility] ENDOSCOPY;  Service: Gastroenterology;  Laterality: N/A;   ESOPHAGOGASTRODUODENOSCOPY (EGD) WITH PROPOFOL  N/A 08/18/2022   Procedure: ESOPHAGOGASTRODUODENOSCOPY (EGD) WITH PROPOFOL ;  Surgeon: Abran Norleen SAILOR, MD;  Location: [facility] ENDOSCOPY;  Service: Gastroenterology;  Laterality: N/A;   FLEXIBLE SIGMOIDOSCOPY N/A 08/18/2022   Procedure: FLEXIBLE SIGMOIDOSCOPY;  Surgeon: Abran Norleen SAILOR, MD;  Location: [facility] ENDOSCOPY;  Service: Gastroenterology;  Laterality: N/A;   FRACTURE SURGERY     left ankle plate   HERNIA REPAIR     inguinal   KNEE SURGERY Right    x 4   SHOULDER SURGERY Bilateral    x 2   TRANSESOPHAGEAL ECHOCARDIOGRAM (CATH LAB) N/A 03/07/2024   Procedure: TRANSESOPHAGEAL ECHOCARDIOGRAM;  Surgeon: Shlomo Wilbert SAUNDERS, MD;   Location: [facility] INVASIVE CV LAB;  Service: Cardiovascular;  Laterality: N/A;   VASECTOMY     Social History   Tobacco Use   Smoking status: Never   Smokeless tobacco: Current    Types: Snuff  Vaping Use   Vaping status: Never Used  Substance Use Topics   Alcohol use: Not Currently   Drug use: Never      ROS: as noted in HPI  Objective:     BP (!) 142/79   Pulse 88   Resp 18   Ht 5' 5 (1.651 m)   Wt 186 lb 12 oz (84.7 kg)   SpO2 100%   BMI 31.08 kg/m  BP Readings from Last 3 Encounters:  04/13/24 (!) 142/79  04/06/24 (!) 150/70  04/03/24 126/76   Wt Readings from Last 3 Encounters:  04/13/24 186 lb 12 oz (84.7 kg)  04/06/24 181 lb 4 oz (82.2 kg)  04/03/24 184 lb (83.5 kg)      Physical Exam Vitals and nursing note reviewed.  Cardiovascular:     Rate and Rhythm: Normal rate.  Pulmonary:     Effort: Pulmonary effort is normal. No respiratory distress.  Chest:  Breasts:    Right: Mass and tenderness present. No bleeding, inverted nipple, nipple discharge or skin change.     Left: Tenderness present. No bleeding, inverted nipple, nipple discharge or skin change.    Musculoskeletal:     Comments: PICC line in R arm  Lymphadenopathy:     Upper Body:     Right upper body: No supraclavicular adenopathy.     Left upper body: No supraclavicular adenopathy.  Skin:    Coloration: Skin is pale.  Neurological:     Gait: Gait abnormal (using rollator).      No results found for any visits on 04/13/24.  Last CBC Lab Results  Component Value Date   WBC 6.8 04/06/2024   HGB 11.2 (L) 04/06/2024   HCT 33.3 (L) 04/06/2024   MCV 89.1 04/06/2024   MCH 28.0 03/09/2024   RDW 24.1 (H) 04/06/2024   PLT 87.0 (L) 04/06/2024   Last metabolic panel Lab Results  Component Value Date   GLUCOSE 98 04/06/2024   NA 134 (L) 04/06/2024   K 4.2 04/06/2024   CL 104 04/06/2024   CO2 24 04/06/2024   BUN 10 04/06/2024   CREATININE 0.87 04/06/2024   GFR 96.46 04/06/2024    CALCIUM  8.9 04/06/2024   PHOS 3.2 01/20/2023   PROT 6.5 04/06/2024   ALBUMIN 2.7 (L) 04/06/2024   LABGLOB 2.8 02/29/2024   AGRATIO 0.6 (L) 10/29/2022   BILITOT 3.6 (H) 04/06/2024   ALKPHOS 97 04/06/2024   AST 52 (H) 04/06/2024   ALT 22 04/06/2024   ANIONGAP 5 03/09/2024      The 10-year ASCVD risk score (Arnett DK, et al., 2019) is: 8.4%  Assessment & Plan:  Gynecomastia, male -     MM 3D DIAGNOSTIC MAMMOGRAM BILATERAL BREAST; Future -     US  LIMITED ULTRASOUND INCLUDING AXILLA RIGHT BREAST; Future  Assessment and Plan    Gynecomastia with right breast lump Painful right breast lump and L breast pain consistent with gynecomastia. Differential includes Mondor syndrome (unlikely, suspect chest vein distention secondary to cirrhosis) and potential breast abscess (pt afebrile and on ceftriaxone  and vanc). - Order breast ultrasound and diagnostic mammogram - monitor for worsening venous distention or erythema of veins - previous alcohol and spironolactone  likely contributing /causative factors - Discussed gynecomastia and need to rule out breast abscess.         No follow-ups on file.   Benton LITTIE Gave, PA

## 2024-04-13 NOTE — Patient Instructions (Signed)
 [facility] [address], [place], [place] [address] Phone: [phone]

## 2024-04-14 ENCOUNTER — Encounter: Payer: Self-pay | Admitting: Urgent Care

## 2024-04-17 ENCOUNTER — Other Ambulatory Visit: Payer: Self-pay | Admitting: Physician Assistant

## 2024-04-17 ENCOUNTER — Other Ambulatory Visit (HOSPITAL_BASED_OUTPATIENT_CLINIC_OR_DEPARTMENT_OTHER): Payer: Self-pay

## 2024-04-17 ENCOUNTER — Other Ambulatory Visit: Payer: Self-pay | Admitting: Family Medicine

## 2024-04-17 MED ORDER — CLOTRIMAZOLE 1 % EX CREA
1.0000 | TOPICAL_CREAM | Freq: Two times a day (BID) | CUTANEOUS | 0 refills | Status: DC
  Filled 2024-04-17: qty 28, 14d supply, fill #0

## 2024-04-18 ENCOUNTER — Ambulatory Visit: Payer: Self-pay | Admitting: Urgent Care

## 2024-04-18 ENCOUNTER — Ambulatory Visit

## 2024-04-18 ENCOUNTER — Other Ambulatory Visit (HOSPITAL_BASED_OUTPATIENT_CLINIC_OR_DEPARTMENT_OTHER): Payer: Self-pay

## 2024-04-18 ENCOUNTER — Ambulatory Visit
Admission: RE | Admit: 2024-04-18 | Discharge: 2024-04-18 | Disposition: A | Discharge status: Home / Self Care | Source: Ambulatory Visit | Attending: Urgent Care | Admitting: Urgent Care

## 2024-04-18 DIAGNOSIS — N62 Hypertrophy of breast: Secondary | ICD-10-CM

## 2024-04-18 MED ORDER — HYDROXYZINE HCL 10 MG PO TABS
10.0000 mg | ORAL_TABLET | Freq: Three times a day (TID) | ORAL | 0 refills | Status: DC | PRN
  Filled 2024-04-18: qty 30, 10d supply, fill #0

## 2024-04-19 ENCOUNTER — Ambulatory Visit: Admitting: Internal Medicine

## 2024-04-19 ENCOUNTER — Emergency Department (HOSPITAL_BASED_OUTPATIENT_CLINIC_OR_DEPARTMENT_OTHER)

## 2024-04-19 ENCOUNTER — Inpatient Hospital Stay (HOSPITAL_BASED_OUTPATIENT_CLINIC_OR_DEPARTMENT_OTHER)
Admission: EM | Admit: 2024-04-19 | Discharge: 2024-04-25 | DRG: 288 | Disposition: A | Discharge status: Home / Self Care | Attending: Internal Medicine | Admitting: Internal Medicine

## 2024-04-19 ENCOUNTER — Encounter (HOSPITAL_BASED_OUTPATIENT_CLINIC_OR_DEPARTMENT_OTHER): Payer: Self-pay | Admitting: Emergency Medicine

## 2024-04-19 ENCOUNTER — Other Ambulatory Visit: Payer: Self-pay

## 2024-04-19 VITALS — BP 143/81 | HR 78 | Temp 97.6°F | Wt 194.0 lb

## 2024-04-19 DIAGNOSIS — F4310 Post-traumatic stress disorder, unspecified: Secondary | ICD-10-CM | POA: Diagnosis present

## 2024-04-19 DIAGNOSIS — K7460 Unspecified cirrhosis of liver: Secondary | ICD-10-CM | POA: Diagnosis present

## 2024-04-19 DIAGNOSIS — K8020 Calculus of gallbladder without cholecystitis without obstruction: Secondary | ICD-10-CM | POA: Diagnosis present

## 2024-04-19 DIAGNOSIS — I350 Nonrheumatic aortic (valve) stenosis: Secondary | ICD-10-CM | POA: Diagnosis present

## 2024-04-19 DIAGNOSIS — Z8249 Family history of ischemic heart disease and other diseases of the circulatory system: Secondary | ICD-10-CM

## 2024-04-19 DIAGNOSIS — Z803 Family history of malignant neoplasm of breast: Secondary | ICD-10-CM

## 2024-04-19 DIAGNOSIS — R9431 Abnormal electrocardiogram [ECG] [EKG]: Secondary | ICD-10-CM | POA: Diagnosis present

## 2024-04-19 DIAGNOSIS — I330 Acute and subacute infective endocarditis: Secondary | ICD-10-CM | POA: Diagnosis not present

## 2024-04-19 DIAGNOSIS — Z885 Allergy status to narcotic agent status: Secondary | ICD-10-CM

## 2024-04-19 DIAGNOSIS — K7030 Alcoholic cirrhosis of liver without ascites: Secondary | ICD-10-CM | POA: Diagnosis present

## 2024-04-19 DIAGNOSIS — R350 Frequency of micturition: Secondary | ICD-10-CM | POA: Diagnosis present

## 2024-04-19 DIAGNOSIS — R6510 Systemic inflammatory response syndrome (SIRS) of non-infectious origin without acute organ dysfunction: Secondary | ICD-10-CM | POA: Diagnosis present

## 2024-04-19 DIAGNOSIS — R509 Fever, unspecified: Secondary | ICD-10-CM | POA: Diagnosis not present

## 2024-04-19 DIAGNOSIS — B954 Other streptococcus as the cause of diseases classified elsewhere: Secondary | ICD-10-CM | POA: Diagnosis not present

## 2024-04-19 DIAGNOSIS — F3181 Bipolar II disorder: Secondary | ICD-10-CM | POA: Diagnosis present

## 2024-04-19 DIAGNOSIS — Z833 Family history of diabetes mellitus: Secondary | ICD-10-CM

## 2024-04-19 DIAGNOSIS — D638 Anemia in other chronic diseases classified elsewhere: Secondary | ICD-10-CM | POA: Diagnosis present

## 2024-04-19 DIAGNOSIS — D6959 Other secondary thrombocytopenia: Secondary | ICD-10-CM | POA: Diagnosis present

## 2024-04-19 DIAGNOSIS — R9389 Abnormal findings on diagnostic imaging of other specified body structures: Secondary | ICD-10-CM | POA: Diagnosis present

## 2024-04-19 DIAGNOSIS — J9811 Atelectasis: Secondary | ICD-10-CM | POA: Diagnosis present

## 2024-04-19 DIAGNOSIS — A0471 Enterocolitis due to Clostridium difficile, recurrent: Secondary | ICD-10-CM

## 2024-04-19 DIAGNOSIS — Z91013 Allergy to seafood: Secondary | ICD-10-CM

## 2024-04-19 DIAGNOSIS — R011 Cardiac murmur, unspecified: Secondary | ICD-10-CM

## 2024-04-19 DIAGNOSIS — F1020 Alcohol dependence, uncomplicated: Secondary | ICD-10-CM | POA: Diagnosis present

## 2024-04-19 DIAGNOSIS — Z5309 Procedure and treatment not carried out because of other contraindication: Secondary | ICD-10-CM | POA: Diagnosis not present

## 2024-04-19 DIAGNOSIS — D696 Thrombocytopenia, unspecified: Secondary | ICD-10-CM | POA: Diagnosis present

## 2024-04-19 DIAGNOSIS — I8510 Secondary esophageal varices without bleeding: Secondary | ICD-10-CM | POA: Diagnosis present

## 2024-04-19 DIAGNOSIS — F1011 Alcohol abuse, in remission: Secondary | ICD-10-CM | POA: Diagnosis present

## 2024-04-19 DIAGNOSIS — I10 Essential (primary) hypertension: Secondary | ICD-10-CM | POA: Diagnosis present

## 2024-04-19 DIAGNOSIS — R197 Diarrhea, unspecified: Secondary | ICD-10-CM | POA: Diagnosis not present

## 2024-04-19 DIAGNOSIS — R7989 Other specified abnormal findings of blood chemistry: Secondary | ICD-10-CM | POA: Diagnosis present

## 2024-04-19 DIAGNOSIS — K3189 Other diseases of stomach and duodenum: Secondary | ICD-10-CM | POA: Diagnosis present

## 2024-04-19 DIAGNOSIS — Z79899 Other long term (current) drug therapy: Secondary | ICD-10-CM

## 2024-04-19 DIAGNOSIS — Z881 Allergy status to other antibiotic agents status: Secondary | ICD-10-CM

## 2024-04-19 DIAGNOSIS — R7881 Bacteremia: Secondary | ICD-10-CM | POA: Diagnosis not present

## 2024-04-19 DIAGNOSIS — Z8619 Personal history of other infectious and parasitic diseases: Secondary | ICD-10-CM

## 2024-04-19 DIAGNOSIS — D649 Anemia, unspecified: Secondary | ICD-10-CM | POA: Diagnosis present

## 2024-04-19 DIAGNOSIS — K766 Portal hypertension: Secondary | ICD-10-CM | POA: Diagnosis present

## 2024-04-19 DIAGNOSIS — Z1152 Encounter for screening for COVID-19: Secondary | ICD-10-CM

## 2024-04-19 DIAGNOSIS — Z888 Allergy status to other drugs, medicaments and biological substances status: Secondary | ICD-10-CM

## 2024-04-19 DIAGNOSIS — E739 Lactose intolerance, unspecified: Secondary | ICD-10-CM | POA: Diagnosis present

## 2024-04-19 DIAGNOSIS — K219 Gastro-esophageal reflux disease without esophagitis: Secondary | ICD-10-CM | POA: Diagnosis present

## 2024-04-19 DIAGNOSIS — K861 Other chronic pancreatitis: Secondary | ICD-10-CM

## 2024-04-19 DIAGNOSIS — J189 Pneumonia, unspecified organism: Principal | ICD-10-CM | POA: Diagnosis present

## 2024-04-19 LAB — CBC WITH DIFFERENTIAL/PLATELET
Abs Immature Granulocytes: 0.03 K/uL (ref 0.00–0.07)
Basophils Absolute: 0.1 K/uL (ref 0.0–0.1)
Basophils Relative: 1 %
Eosinophils Absolute: 0.4 K/uL (ref 0.0–0.5)
Eosinophils Relative: 5 %
HCT: 27.9 % — ABNORMAL LOW (ref 39.0–52.0)
Hemoglobin: 9.4 g/dL — ABNORMAL LOW (ref 13.0–17.0)
Immature Granulocytes: 0 %
Lymphocytes Relative: 35 %
Lymphs Abs: 2.3 K/uL (ref 0.7–4.0)
MCH: 30.5 pg (ref 26.0–34.0)
MCHC: 33.7 g/dL (ref 30.0–36.0)
MCV: 90.6 fL (ref 80.0–100.0)
Monocytes Absolute: 0.8 K/uL (ref 0.1–1.0)
Monocytes Relative: 11 %
Neutro Abs: 3.2 K/uL (ref 1.7–7.7)
Neutrophils Relative %: 48 %
Platelets: 75 K/uL — ABNORMAL LOW (ref 150–400)
RBC: 3.08 MIL/uL — ABNORMAL LOW (ref 4.22–5.81)
RDW: 21.9 % — ABNORMAL HIGH (ref 11.5–15.5)
WBC: 6.7 K/uL (ref 4.0–10.5)
nRBC: 0 % (ref 0.0–0.2)

## 2024-04-19 LAB — URINALYSIS, W/ REFLEX TO CULTURE (INFECTION SUSPECTED)
Bilirubin Urine: NEGATIVE
Glucose, UA: NEGATIVE mg/dL
Ketones, ur: NEGATIVE mg/dL
Leukocytes,Ua: NEGATIVE
Nitrite: NEGATIVE
Protein, ur: NEGATIVE mg/dL
Specific Gravity, Urine: 1.015 (ref 1.005–1.030)
pH: 8.5 — ABNORMAL HIGH (ref 5.0–8.0)

## 2024-04-19 LAB — COMPREHENSIVE METABOLIC PANEL WITH GFR
ALT: 26 U/L (ref 0–44)
AST: 76 U/L — ABNORMAL HIGH (ref 15–41)
Albumin: 2.7 g/dL — ABNORMAL LOW (ref 3.5–5.0)
Alkaline Phosphatase: 90 U/L (ref 38–126)
Anion gap: 9 (ref 5–15)
BUN: 16 mg/dL (ref 6–20)
CO2: 24 mmol/L (ref 22–32)
Calcium: 8.4 mg/dL — ABNORMAL LOW (ref 8.9–10.3)
Chloride: 108 mmol/L (ref 98–111)
Creatinine, Ser: 1.05 mg/dL (ref 0.61–1.24)
GFR, Estimated: >60 mL/min (ref >60)
Glucose, Bld: 107 mg/dL — ABNORMAL HIGH (ref 70–99)
Potassium: 4.2 mmol/L (ref 3.5–5.1)
Sodium: 140 mmol/L (ref 135–145)
Total Bilirubin: 2.4 mg/dL — ABNORMAL HIGH (ref 0.0–1.2)
Total Protein: 5.4 g/dL — ABNORMAL LOW (ref 6.5–8.1)

## 2024-04-19 LAB — RESP PANEL BY RT-PCR (RSV, FLU A&B, COVID)  RVPGX2
Influenza A by PCR: NEGATIVE
Influenza B by PCR: NEGATIVE
Resp Syncytial Virus by PCR: NEGATIVE
SARS Coronavirus 2 by RT PCR: NEGATIVE

## 2024-04-19 LAB — LACTIC ACID, PLASMA: Lactic Acid, Venous: 1.6 mmol/L (ref 0.5–1.9)

## 2024-04-19 LAB — LIPASE, BLOOD: Lipase: 139 U/L — ABNORMAL HIGH (ref 11–51)

## 2024-04-19 LAB — PRO BRAIN NATRIURETIC PEPTIDE: Pro Brain Natriuretic Peptide: 106 pg/mL (ref <300.0)

## 2024-04-19 MED ORDER — METOCLOPRAMIDE HCL 5 MG/ML IJ SOLN
10.0000 mg | Freq: Once | INTRAMUSCULAR | Status: AC
  Administered 2024-04-20: 10 mg via INTRAVENOUS
  Filled 2024-04-19: qty 2

## 2024-04-19 MED ORDER — VANCOMYCIN HCL IN DEXTROSE 1-5 GM/200ML-% IV SOLN
1000.0000 mg | Freq: Once | INTRAVENOUS | Status: AC
  Administered 2024-04-19: 1000 mg via INTRAVENOUS
  Filled 2024-04-19: qty 200

## 2024-04-19 MED ORDER — FENTANYL CITRATE PF 50 MCG/ML IJ SOSY
50.0000 ug | PREFILLED_SYRINGE | Freq: Once | INTRAMUSCULAR | Status: AC
  Administered 2024-04-19: 50 ug via INTRAVENOUS
  Filled 2024-04-19: qty 1

## 2024-04-19 MED ORDER — VANCOMYCIN HCL IN DEXTROSE 1-5 GM/200ML-% IV SOLN
1000.0000 mg | Freq: Once | INTRAVENOUS | Status: AC
  Administered 2024-04-20: 1000 mg via INTRAVENOUS
  Filled 2024-04-19: qty 200

## 2024-04-19 MED ORDER — SODIUM CHLORIDE 0.9 % IV SOLN
2.0000 g | Freq: Once | INTRAVENOUS | Status: AC
  Administered 2024-04-19: 2 g via INTRAVENOUS
  Filled 2024-04-19: qty 12.5

## 2024-04-19 NOTE — Progress Notes (Signed)
 Patient: Juan [name]  DOB: May 11, 1966 MRN: [id number] PCP: Alvia Bring, DO    Chief Complaint  Patient presents with   Follow-up    Leg swelling x3 days-      Patient Active Problem List   Diagnosis Date Noted   Endocarditis, valve unspecified 03/20/2024   Intertrigo 03/20/2024   Acute bacterial endocarditis 03/07/2024   Abnormal finding on GI tract imaging 03/03/2024   Gram-positive bacteremia 02/26/2024   Sepsis (HCC) 02/15/2024   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) 01/23/2024   Right wrist pain 01/23/2024   Alcoholic polyneuropathy (HCC) 93/97/7974   Moderate aortic stenosis 11/17/2023   Obesity (BMI 30.0-34.9) 11/17/2023   Alcohol addiction (HCC)    Cirrhosis (HCC)    Colon polyps    Depression    Hemochromatosis    Hx of blood clots    Hyperreflexia    Hypertension    Traumatic hemorrhagic shock (HCC)    Right wrist injury 10/18/2023   AKI (acute kidney injury) (HCC) 09/11/2023   Febrile illness 06/27/2023   Bipolar 2 disorder, major depressive episode (HCC) 05/23/2023   Alcohol use disorder 05/23/2023   Arthritis 05/23/2023   Pain of left calf 04/11/2023   Chest trauma 03/18/2023   Acute pain of right knee 03/10/2023   Eustachian tube dysfunction 03/02/2023   Breast nodule 01/12/2023   Post-traumatic osteoarthritis of right knee 01/04/2023   Acute bacterial sinusitis 11/15/2022   Rib pain on right side 10/31/2022   Head injury 10/31/2022   Bilateral lower extremity edema 10/12/2022   Epistaxis 09/14/2022   Anasarca 09/14/2022   Hematemesis 09/13/2022   Lower extremity edema 09/06/2022   Hyperbilirubinemia 08/17/2022   Anxiety 08/17/2022   GI bleed 08/16/2022   Lumbar degenerative disc disease 07/26/2022   Prolonged QT interval 05/05/2022   Anemia, chronic disease 05/05/2022   Hyponatremia 03/02/2022   GERD (gastroesophageal reflux disease) 03/02/2022   Normocytic anemia 03/02/2022   Tobacco chew use 03/02/2022    Hiatal hernia 02/20/2022   Alcoholic cirrhosis of liver with ascites (HCC) 02/17/2022   DDD (degenerative disc disease), cervical 01/22/2022   Hypokalemia 01/11/2022   Hypomagnesemia 01/11/2022   Malnutrition of moderate degree 01/11/2022   Nonrheumatic aortic valve stenosis    Shoulder pain, left, posterior 12/22/2021   Portal hypertensive gastropathy (HCC)    Pancytopenia (HCC) 09/18/2021   Tinea pedis 05/12/2021   Diverticulosis 04/20/2021   Right lower quadrant pain 04/17/2021   Tibialis posterior tendinitis, right 04/14/2021   Dizziness 03/23/2021   Urinary frequency 03/23/2021   Right ankle sprain 02/12/2021   Well adult exam 01/06/2021   Systolic murmur 01/06/2021   Thrombocytopenia (HCC) 11/02/2020   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 07/11/2020   Allergic reaction 07/08/2020   History of alcohol abuse 05/20/2020   Impingement syndrome, shoulder, right 05/20/2020   Fracture of laryngeal cartilage (HCC) 01/17/2020   Esophagitis, [place] grade D 12/05/2019   PTSD (post-traumatic stress disorder) 04/16/2019   MDD (major depressive disorder), recurrent severe, 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   History of bilateral inguinal hernia repair 01/19/2019   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 difficile x 3 in the past 2 years followed by infectious disease given history strep para sanguinous bacteremia  call back to ED due to positive blood cultures presents for hospital follow-up of strep sanguinous and mitis/oralis bacteremia.  Blood culture at outside facility on 6/18 grew strep para sanguinous, patient did not stay overnight and was discharged he received 1 dose IV antibiotics and then  cefdinir .  Seen by PCP on 6/30 with blood cultures no growth he has been having fevers seen in ID clinic on 7/10 was plan was for treat with 6 weeks of IV penicillin given concern for endocarditis.  Blood cultures from 7/19 PCPs office were positive and patient called back. - Blood cultures from admission on 7/12 grew strep mitis 1 out of 2 sets and step para sanguinous 1 out of 2 sepsis.  Orthopantogram negative.  CT abdomen pelvis on admission showed acute pancreatitis, possible gallstone pericarditis or cholecystitis.  MRCP showed possible cholecystitis.  General surgery consulted no plans for OR.  TEE showed shaggy mobile dense aortic valve with hypermobile septum.  CT surgery with no plans for OR.  Discharged on ceftriaxone  x 4 weeks EOT 8/12.  Also given p.o. Vanco twice daily given C. difficile history.  Outpatient GI follow-up pending  03/21/2024: no missed doses. No gi issues. Some chang ein change, wihtout change in po intake Today: dong well. no new complaints.  Review of Systems  All other systems reviewed and are negative.   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     Allergic reaction 07/08/2020   Allergy    Anasarca 09/14/2022   Anemia of chronic disease 05/05/2022   Anxiety    Arthritis 05/23/2023   Bacteremia due to Enterococcus 09/21/2023   Benign essential hypertension 12/21/2016   Bilateral lower extremity edema 10/12/2022   Bipolar 2 disorder, major depressive episode (HCC) 05/23/2023   Bleeding internal hemorrhoids 08/18/2022   Breast nodule 01/12/2023   Chest trauma 03/18/2023    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   Chronic fatigue    Cirrhosis (HCC)    Colon polyps    DDD (degenerative disc disease), cervical 01/22/2022   Depression    Diverticulosis 04/20/2021   Dizziness 03/23/2021   Epistaxis 09/14/2022   Esophagitis, [place] grade D 12/05/2019   Formatting of this note might be different from the original.  Chronic GERD with HH  09/2019--EGD--[facility], [place]--showed no varices; gr D esophagitis;  Rx: PPI daily     Eustachian tube dysfunction 03/02/2023   Febrile illness 06/27/2023   Fracture of laryngeal cartilage (HCC) 01/17/2020   Last Assessment & Plan:   Formatting of this note might be different from the original.  Emergency department follow-up for evaluation of laryngeal trauma.  CT obtained the night of the injury is reviewed independently and shows nondisplaced fracture of the thyroid  cartilage.  In general, his voice is much improved from the night of the trauma.  Denies any difficulty breathing.  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 07/11/2020   Lower extremity edema 09/06/2022   Lumbar degenerative disc disease 07/26/2022   Malnutrition of moderate degree 01/11/2022   MDD (major depressive disorder), recurrent severe, 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   Nonrheumatic aortic valve stenosis    Normocytic anemia 03/02/2022   Pain of left calf  04/11/2023   Pancytopenia (HCC) 09/18/2021   Portal hypertensive gastropathy (HCC)    Post-traumatic osteoarthritis of right knee 01/04/2023   Prolonged QT interval 05/05/2022   PTSD (post-traumatic stress disorder)    Rib pain on right side 10/31/2022   Right ankle sprain 02/12/2021   Right lower quadrant pain 04/17/2021   Right wrist injury 10/18/2023   Shoulder pain, left, posterior 12/22/2021   Sleep apnea    Substance abuse (HCC)    Systolic murmur 01/06/2021   Thrombocytopenia (HCC) 11/02/2020   Tibialis posterior tendinitis, right 04/14/2021   Tinea pedis 05/12/2021   Tobacco chew use 03/02/2022   Traumatic hemorrhagic shock (HCC)    Ulcer    Urinary frequency 03/23/2021   Well adult exam 01/06/2021    Outpatient Medications Prior to Visit  Medication Sig Dispense Refill   calcium  carbonate (TUMS - DOSED IN MG ELEMENTAL CALCIUM ) 500 MG chewable tablet Chew 3 tablets by mouth at bedtime.     clotrimazole  (CLOTRIMAZOLE  ANTI-FUNGAL) 1 % cream Apply 1 Application topically 2 (two) times daily. 28 g 0   ferrous sulfate  325 (65 FE) MG tablet Take 1 tablet (325 mg total) by mouth daily with breakfast. 100 tablet 0   furosemide  (LASIX ) 40 MG tablet Take 40 mg by mouth daily.     hydrOXYzine  (ATARAX ) 10 MG tablet Take 1 tablet (10 mg total) by mouth every 8 (eight) hours as needed for itching (1- 2 pills for itching). 30 tablet 0   lactulose , encephalopathy, (CHRONULAC ) 10 GM/15ML SOLN Take 10 g by mouth daily as needed (constipation).     ondansetron  (ZOFRAN -ODT) 4 MG disintegrating tablet Take 1 tablet (4 mg total) by mouth every 8 (eight) hours as needed. 20 tablet 0   pantoprazole  (PROTONIX ) 40 MG tablet Take 1 tablet (40 mg total) by mouth daily. 90 tablet 0   QUEtiapine  (SEROQUEL ) 50 MG tablet Take 0.5-1 tablets (25-50 mg total) by mouth at bedtime. (Patient taking differently: Take 50 mg by mouth at bedtime.) 90 tablet 1   spironolactone  (ALDACTONE ) 100 MG tablet Take 1 tablet  (100 mg total) by mouth daily. 90 tablet 1   vancomycin  (VANCOCIN ) 125 MG capsule Take 1 capsule (125 mg total) by mouth in the morning and at bedtime. 60 capsule 1   No facility-administered medications prior to visit.     Allergies  Allergen Reactions   Apple Juice Swelling and Other (See Comments)    Tongue swelling   Cucumber Extract Itching and Nausea And Vomiting    No extracts; just cucumber   Depakote Er [Divalproex Sodium Er] Swelling and Other (See Comments)    Tongue swelling   Depakote [Valproic Acid] Anaphylaxis   Peanut Butter Flavoring Agent (Non-Screening) Anaphylaxis and Swelling   Peanut Oil Swelling   Peanut-Containing Drug Products Swelling  Shellfish Allergy Itching and Swelling   Shrimp Extract Itching and Swelling   Apple Swelling   Cantaloupe Extract Allergy Skin Test Rash   Codeine Itching, Rash and Other (See Comments)    Patient reports he can take CODONES without problems   Firvanq  [Vancomycin ] Rash    Pt is currently taking, pt can take in low doses but not high doses he turns red all over   Lactose Intolerance (Gi) Diarrhea and Other (See Comments)    Indigestion, Stomach pain, Flatulence    Social History   Tobacco Use   Smoking status: Never   Smokeless tobacco: Current    Types: Snuff  Vaping Use   Vaping status: Never Used  Substance Use Topics   Alcohol use: Not Currently   Drug use: Never    Family History  Problem Relation Age of Onset   Pulmonary fibrosis Mother    Hypertension Father    Other Father        liver failure   Diabetes Brother    Breast cancer Paternal Aunt    Colon cancer Neg Hx    Esophageal cancer Neg Hx    Stomach cancer Neg Hx    Rectal cancer Neg Hx     Objective:   Vitals:   04/19/24 0919  BP: (!) 143/81  Pulse: 78  Temp: 97.6 F (36.4 C)  TempSrc: Oral  SpO2: 97%  Weight: 194 lb (88 kg)   Body mass index is 32.28 kg/m.  Physical Exam Constitutional:      General: He is not in acute  distress.    Appearance: He is normal weight. He is not toxic-appearing.  HENT:     Head: Normocephalic and atraumatic.     Right Ear: External ear normal.     Left Ear: External ear normal.     Nose: No congestion or rhinorrhea.     Mouth/Throat:     Mouth: Mucous membranes are moist.     Pharynx: Oropharynx is clear.  Eyes:     Extraocular Movements: Extraocular movements intact.     Conjunctiva/sclera: Conjunctivae normal.     Pupils: Pupils are equal, round, and reactive to light.  Cardiovascular:     Rate and Rhythm: Normal rate and regular rhythm.     Heart sounds: No murmur heard.    No friction rub. No gallop.  Pulmonary:     Effort: Pulmonary effort is normal.     Breath sounds: Normal breath sounds.  Abdominal:     General: Abdomen is flat. Bowel sounds are normal.     Palpations: Abdomen is soft.  Musculoskeletal:        General: No swelling. Normal range of motion.     Cervical back: Normal range of motion and neck supple.  Skin:    General: Skin is warm and dry.  Neurological:     General: No focal deficit present.     Mental Status: He is oriented to person, place, and time.  Psychiatric:        Mood and Affect: Mood normal.     Lab Results: Lab Results  Component Value Date   WBC 6.8 04/06/2024   HGB 11.2 (L) 04/06/2024   HCT 33.3 (L) 04/06/2024   MCV 89.1 04/06/2024   PLT 87.0 (L) 04/06/2024    Lab Results  Component Value Date   CREATININE 0.87 04/06/2024   BUN 10 04/06/2024   NA 134 (L) 04/06/2024   K 4.2 04/06/2024   CL 104  04/06/2024   CO2 24 04/06/2024    Lab Results  Component Value Date   ALT 22 04/06/2024   AST 52 (H) 04/06/2024   ALKPHOS 97 04/06/2024   BILITOT 3.6 (H) 04/06/2024     Assessment & Plan:  #Strep paradanguinis and mitis/oralis bacteremia #History of C. Difficile #Possible gallstone pancreatitis vs cholecysttiis -Blood culture at outside facility on 6/18 grew strep para sanguinous patient got 1 dose IV  antibiotics and addition of cefdinir  and did not stay overnight as blood cultures returned later time - Seen by PCP on 6/30 blood cultures no growth.  Continue to have fevers defer to infectious disease - Seen in ID clinic on 7/10 with plan for IV antibiotics x 6 weeks empiric endocarditis coverage - Blood cultures on 7/9 grew GPC.  Returned back.  Blood cultures on that came back as strep para sanguinous - Repeat blood culture due to hospitalization 7/12 grew 1/2 aspirant strep mitis/oralis in 1/wrist sprain segments.  Of note workup including CT abdomen pelvis showed acute pancreatitis, possible gallstone pancreatitis versus cholecystitis.  MRCP showed possible cholecystitis.  General surgery with no plans for OR.  Patient has GI follow-up.  I suspect possible GI source for bacteremia given new species on stone/12 with Septra mitis/oralis and negative orthopantogram.  Of note he did have dental procedure prior to hospitalization on 6/18. - TEE showed shaggy mobile density on aortic valve. - Patient discharged on ceftriaxone  x 4 weeks EOT 8/12 extedn till 8/24 to complete 6 weeks. PICC out. -Seen by GI, referred ot [facility] abdominal transplant surgery for possbile GB removal  -Followup in one month to do blood Cx off of abx   #medication management  #PIC line-out -labs 8/19 labs CRP 4 ESR 7, serum creatinine 0.83, WBC 5.3     #Recurrent C. difficile - Patient placed on p.o. vancomycin  for secondary prophylaxis while on antibiotics and 7 days out(8/30)    Loney Stank, MD [facility]   04/19/24  9:32 AM I have personally spent 45 minutes involved in face-to-face and non-face-to-face activities for this patient on the day of the visit. Professional time spent includes the following activities: Preparing to see the patient (review of tests), Obtaining and/or reviewing separately obtained history (admission/discharge record), Performing a  medically appropriate examination and/or evaluation , Ordering medications/tests/procedures, referring and communicating with other health care professionals, Documenting clinical information in the EMR, Independently interpreting results (not separately reported), Communicating results to the patient/family/caregiver, Counseling and educating the patient/family/caregiver and Care coordination (not separately reported).

## 2024-04-19 NOTE — ED Provider Notes (Signed)
 [place] [facility] Provider Note   CSN: [id number] Arrival date & time: 04/19/24  2027     Patient presents with: Fever   Juan [name] is a 58 y.o. male.   The history is provided by the patient and medical records. No language interpreter was used.  Fever Max temp prior to arrival:  101.8 Temp source:  Tympanic Severity:  Severe Onset quality:  Gradual Duration:  1 day Timing:  Constant Progression:  Waxing and waning Chronicity:  New Relieved by:  Nothing Worsened by:  Nothing Ineffective treatments:  None tried Associated symptoms: chills, cough, myalgias and nausea   Associated symptoms: no chest pain, no confusion, no congestion, no diarrhea, no dysuria, no headaches, no rash, no sore throat and no vomiting   Risk factors: recent sickness        Prior to Admission medications   Medication Sig Start Date End Date Taking? Authorizing Provider  calcium  carbonate (TUMS - DOSED IN MG ELEMENTAL CALCIUM ) 500 MG chewable tablet Chew 3 tablets by mouth at bedtime.    [provider]  clotrimazole  (CLOTRIMAZOLE  ANTI-FUNGAL) 1 % cream Apply 1 Application topically 2 (two) times daily. 04/17/24   Alvia Bring, DO  ferrous sulfate  325 (65 FE) MG tablet Take 1 tablet (325 mg total) by mouth daily with breakfast. 03/10/24 06/17/24  Leotis Bogus, MD  furosemide  (LASIX ) 40 MG tablet Take 40 mg by mouth daily.    [provider]  hydrOXYzine  (ATARAX ) 10 MG tablet Take 1 tablet (10 mg total) by mouth every 8 (eight) hours as needed for itching (1- 2 pills for itching). 04/18/24   Craig Alan SAUNDERS, PA-C  lactulose , encephalopathy, (CHRONULAC ) 10 GM/15ML SOLN Take 10 g by mouth daily as needed (constipation).    [provider]  ondansetron  (ZOFRAN -ODT) 4 MG disintegrating tablet Take 1 tablet (4 mg total) by mouth every 8 (eight) hours as needed. 02/20/24   Long, Joshua G, MD  pantoprazole  (PROTONIX ) 40 MG tablet Take  1 tablet (40 mg total) by mouth daily. 04/03/24 07/03/24  Alvia Bring, DO  QUEtiapine  (SEROQUEL ) 50 MG tablet Take 0.5-1 tablets (25-50 mg total) by mouth at bedtime. Patient taking differently: Take 50 mg by mouth at bedtime. 01/18/24   Alvia Bring, DO  spironolactone  (ALDACTONE ) 100 MG tablet Take 1 tablet (100 mg total) by mouth daily. 02/15/24   Alvia Bring, DO  vancomycin  (VANCOCIN ) 125 MG capsule Take 1 capsule (125 mg total) by mouth in the morning and at bedtime. 03/01/24 05/02/24  Overton Constance DASEN, MD    Allergies: Apple juice, Cucumber extract, Depakote er [divalproex sodium er], Depakote [valproic acid], Peanut butter flavoring agent (non-screening), Peanut oil, Peanut-containing drug products, Shellfish allergy, Shrimp extract, Apple, Cantaloupe extract allergy skin test, Codeine, Firvanq  [vancomycin ], and Lactose intolerance (gi)    Review of Systems  Constitutional:  Positive for chills, fatigue and fever.  HENT:  Negative for congestion and sore throat.   Respiratory:  Positive for cough. Negative for chest tightness, shortness of breath and wheezing.   Cardiovascular:  Negative for chest pain, palpitations and leg swelling.  Gastrointestinal:  Positive for nausea. Negative for abdominal pain, diarrhea and vomiting.  Genitourinary:  Negative for dysuria.  Musculoskeletal:  Positive for myalgias. Negative for back pain.  Skin:  Negative for rash.  Neurological:  Negative for light-headedness and headaches.  Psychiatric/Behavioral:  Negative for confusion.   All other systems reviewed and are negative.   Updated Vital Signs BP ROLLEN)  162/84   Pulse 96   Temp 98.3 F (36.8 C)   Resp 16   Ht 5' 5 (1.651 m)   Wt 87.9 kg   SpO2 96%   BMI 32.25 kg/m   Physical Exam Vitals and nursing note reviewed.  Constitutional:      General: He is not in acute distress.    Appearance: He is well-developed. He is not ill-appearing, toxic-appearing or diaphoretic.  HENT:     Head:  Normocephalic and atraumatic.     Nose: No congestion or rhinorrhea.     Mouth/Throat:     Mouth: Mucous membranes are moist.     Pharynx: No oropharyngeal exudate.  Eyes:     Extraocular Movements: Extraocular movements intact.     Conjunctiva/sclera: Conjunctivae normal.  Cardiovascular:     Rate and Rhythm: Regular rhythm. Tachycardia present.     Heart sounds: Murmur heard.  Pulmonary:     Effort: Pulmonary effort is normal. No respiratory distress.     Breath sounds: Normal breath sounds. No wheezing, rhonchi or rales.  Chest:     Chest wall: No tenderness.  Abdominal:     General: Abdomen is flat.     Palpations: Abdomen is soft.     Tenderness: There is no abdominal tenderness. There is no right CVA tenderness, left CVA tenderness, guarding or rebound.  Musculoskeletal:        General: No swelling or tenderness.     Cervical back: Neck supple.     Right lower leg: Edema present.     Left lower leg: Edema present.  Skin:    General: Skin is warm and dry.     Capillary Refill: Capillary refill takes less than 2 seconds.     Findings: No erythema or rash.  Neurological:     General: No focal deficit present.     Mental Status: He is alert.     Sensory: No sensory deficit.     Motor: No weakness.  Psychiatric:        Mood and Affect: Mood normal.     (all labs ordered are listed, but only abnormal results are displayed) Labs Reviewed  COMPREHENSIVE METABOLIC PANEL WITH GFR - Abnormal; Notable for the following components:      Result Value   Glucose, Bld 107 (*)    Calcium  8.4 (*)    Total Protein 5.4 (*)    Albumin 2.7 (*)    AST 76 (*)    Total Bilirubin 2.4 (*)    All other components within normal limits  CBC WITH DIFFERENTIAL/PLATELET - Abnormal; Notable for the following components:   RBC 3.08 (*)    Hemoglobin 9.4 (*)    HCT 27.9 (*)    RDW 21.9 (*)    Platelets 75 (*)    All other components within normal limits  URINALYSIS, W/ REFLEX TO CULTURE  (INFECTION SUSPECTED) - Abnormal; Notable for the following components:   pH 8.5 (*)    Hgb urine dipstick TRACE (*)    Bacteria, UA RARE (*)    All other components within normal limits  LIPASE, BLOOD - Abnormal; Notable for the following components:   Lipase 139 (*)    All other components within normal limits  RESP PANEL BY RT-PCR (RSV, FLU A&B, COVID)  [id number]  CULTURE, BLOOD (ROUTINE X 2)  CULTURE, BLOOD (ROUTINE X 2)  LACTIC ACID, PLASMA  LACTIC ACID, PLASMA    EKG: None  Radiology: DG Chest 2 View Result  Date: 04/19/2024 CLINICAL DATA:  Cough EXAM: CHEST - 2 VIEW COMPARISON:  02/20/2024 FINDINGS: Streaky lower lobe opacities due to atelectasis or mild infiltrate. No pleural effusion. Normal cardiac size. No pneumothorax IMPRESSION: Streaky lower lobe opacities due to atelectasis or mild infiltrate. Electronically Signed   By: Luke Bun M.D.   On: 04/19/2024 21:00   MM 3D DIAGNOSTIC MAMMOGRAM BILATERAL BREAST Result Date: 04/18/2024 CLINICAL DATA:  58 year old gentleman with BILATERAL retroareolar breast pain for 2 weeks. EXAM: DIGITAL DIAGNOSTIC BILATERAL MAMMOGRAM WITH TOMOSYNTHESIS AND CAD TECHNIQUE: Bilateral digital diagnostic mammography and breast tomosynthesis was performed. The images were evaluated with computer-aided detection. COMPARISON:  Previous exam(s). ACR Breast Density Category b: There are scattered areas of fibroglandular density. FINDINGS: RIGHT: Physical examination: Focused examination of the retroareolar region demonstrates mild firm mobile tissue. Mammogram: Flame shaped asymmetry in the retroareolar RIGHT breast is consistent with gynecomastia. No suspicious mass, distortion, or microcalcifications are identified to suggest presence of malignancy. LEFT: Physical examination: Focused examination of the LEFT retroareolar region demonstrates mild firm mobile tissue. Mammogram: Flame shaped asymmetry in the retroareolar LEFT breast is consistent with  gynecomastia. No suspicious mass, distortion, or microcalcifications are identified to suggest presence of malignancy. IMPRESSION: 1. No evidence of RIGHT or LEFT breast malignancy. 2. BILATERAL gynecomastia. RECOMMENDATION: No further mammographic workup is recommended, unless the patient develops new symptoms or signs. Gynecomastia is common and can occur with changes in the testosterone :estrogen ratio. Potential causes of gynecomastia include numerous prescription medications, dietary supplements, anabolic steroids, and recreational drugs, particularly marijuana. Other causes include hormone secreting testicular or pituitary tumors. Gynecomastia can be related to other medical problems, such as kidney, thyroid , or liver disease. Gynecomastia often resolves on its own. I have discussed the findings and recommendations with the patient. If applicable, a reminder letter will be sent to the patient regarding the next appointment. BI-RADS CATEGORY  2: Benign. Electronically Signed   By: Aliene Lloyd M.D.   On: 04/18/2024 10:43     Procedures   CRITICAL CARE Performed by: Lonni PARAS Zacharee Gaddie Total critical care time: 30 minutes Critical care time was exclusive of separately billable procedures and treating other patients. Critical care was necessary to treat or prevent imminent or life-threatening deterioration. Critical care was time spent personally by me on the following activities: development of treatment plan with patient and/or surrogate as well as nursing, discussions with consultants, evaluation of patient's response to treatment, examination of patient, obtaining history from patient or surrogate, ordering and performing treatments and interventions, ordering and review of laboratory studies, ordering and review of radiographic studies, pulse oximetry and re-evaluation of patient's condition.  Medications Ordered in the ED  vancomycin  (VANCOCIN ) IVPB 1000 mg/200 mL premix (has no  administration in time range)    Followed by  vancomycin  (VANCOCIN ) IVPB 1000 mg/200 mL premix (has no administration in time range)  fentaNYL  (SUBLIMAZE ) injection 50 mcg (50 mcg Intravenous Given 04/19/24 2226)  ceFEPIme  (MAXIPIME ) 2 g in sodium chloride  0.9 % 100 mL IVPB (2 g Intravenous New Bag/Given 04/19/24 2245)                                    Medical Decision Making Amount and/or Complexity of Data Reviewed Labs: ordered. Radiology: ordered.  Risk Prescription drug management. Decision regarding hospitalization.    Juan [name] is a 58 y.o. 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  According to patient, he finished all of his antibiotics on Wednesday and for the last 24 hours has developed shaking and chills.  He had a temperature temp orally of 101.8 earlier and was told by infectious ease to come get evaluated.  He denies any abdominal pain whatsoever and only had some mild nausea.  Denies constipation, diarrhea, or urinary changes.  He does report some cough but no chest pain or shortness of breath.  He is feeling generalized malaise and fatigue and myalgias.  Denies significant headache or neck pain at this time.  No focal neurologic complaints.  On exam, he does have some crackles at the bases of his lungs but did not hear significant wheezing.  Chest nontender.  He has a significant systolic murmur.  Abdomen nontender.  Good pulses in extremities.  Patient otherwise resting.  Patient felt very warm to the touch.    Given his recent completion of antibiotics and this new fever and systemic symptoms of infection, we will get screening labs and imaging and urinalysis.  With a cough and x-ray was obtained and does show evidence of infiltrate.  Labs did not show leukocytosis or lactic  acidosis which is reassuring and he has mild anemia.  His liver function and lipase is similar to prior and lipase is actually improved from prior.  Urinalysis still pending but he has no urinary symptoms, doubt UTI.  His COVID/flu/RSV test is negative.  I called and spoke with infectious disease given his recent completion of antibiotics and this new fever with his murmur and pneumonia and they would like him admitted to the hospital.  They recommended vancomycin  and cefepime  and he will need to admitted for an echo and further infectious workup.  Will call for medicine admission for further management of pneumonia.     Final diagnoses:  Pneumonia due to infectious organism, unspecified laterality, unspecified part of lung  Murmur    Clinical Impression: 1. Pneumonia due to infectious organism, unspecified laterality, unspecified part of lung   2. Murmur     Disposition: Admit  This note was prepared with assistance of Dragon voice recognition software. Occasional wrong-word or sound-a-like substitutions may have occurred due to the inherent limitations of voice recognition software.       Yaakov Saindon, Lonni PARAS, MD 04/19/24 [phone]

## 2024-04-19 NOTE — Progress Notes (Signed)
 ED Pharmacy Antibiotic Sign Off An antibiotic consult was received from an ED provider for cefepime /vancomycin  per pharmacy dosing for bacteremia. A chart review was completed to assess appropriateness.   -PMH includes hx of strep mitis/oralis bacteremia, C. Diff was discharged from last admission of ceftriaxone  2g IV q24 for 6 weeks (till 8/24); PICC line out  The following one time order(s) were placed:   -Cefepime  2g IV x1 -Vancomycin  2g IV x1  Further antibiotic and/or antibiotic pharmacy consults should be ordered by the admitting provider if indicated.   Thank you for allowing pharmacy to be a part of this patient's care.   Lynwood Poplar, [facility]  Clinical Pharmacist 04/19/24 10:32 PM

## 2024-04-19 NOTE — ED Triage Notes (Signed)
 Per pt family had endocarditis, in pt with IV antibiotics. D/C'd Picc line Monday, Fever, headache, cough, SOB and body aches started tonight about 7p.

## 2024-04-20 DIAGNOSIS — K7460 Unspecified cirrhosis of liver: Secondary | ICD-10-CM

## 2024-04-20 DIAGNOSIS — K8020 Calculus of gallbladder without cholecystitis without obstruction: Secondary | ICD-10-CM

## 2024-04-20 DIAGNOSIS — J189 Pneumonia, unspecified organism: Secondary | ICD-10-CM | POA: Diagnosis present

## 2024-04-20 DIAGNOSIS — F1011 Alcohol abuse, in remission: Secondary | ICD-10-CM

## 2024-04-20 DIAGNOSIS — D649 Anemia, unspecified: Secondary | ICD-10-CM | POA: Diagnosis not present

## 2024-04-20 DIAGNOSIS — R6510 Systemic inflammatory response syndrome (SIRS) of non-infectious origin without acute organ dysfunction: Secondary | ICD-10-CM | POA: Diagnosis not present

## 2024-04-20 DIAGNOSIS — R9389 Abnormal findings on diagnostic imaging of other specified body structures: Secondary | ICD-10-CM

## 2024-04-20 DIAGNOSIS — R9431 Abnormal electrocardiogram [ECG] [EKG]: Secondary | ICD-10-CM

## 2024-04-20 HISTORY — DX: Calculus of gallbladder without cholecystitis without obstruction: K80.20

## 2024-04-20 HISTORY — DX: Pneumonia, unspecified organism: J18.9

## 2024-04-20 HISTORY — DX: Abnormal findings on diagnostic imaging of other specified body structures: R93.89

## 2024-04-20 LAB — RESPIRATORY PANEL BY PCR

## 2024-04-20 LAB — TROPONIN I (HIGH SENSITIVITY)
Troponin I (High Sensitivity): 17 ng/L (ref <18)
Troponin I (High Sensitivity): 19 ng/L — ABNORMAL HIGH (ref <18)

## 2024-04-20 LAB — PROCALCITONIN: Procalcitonin: <0.1 ng/mL

## 2024-04-20 LAB — HEMOGLOBIN AND HEMATOCRIT, BLOOD
HCT: 26 % — ABNORMAL LOW (ref 39.0–52.0)
Hemoglobin: 8.9 g/dL — ABNORMAL LOW (ref 13.0–17.0)

## 2024-04-20 LAB — SEDIMENTATION RATE: Sed Rate: 10 mm/h (ref 0–16)

## 2024-04-20 LAB — C-REACTIVE PROTEIN: CRP: 1.1 mg/dL — ABNORMAL HIGH (ref <1.0)

## 2024-04-20 MED ORDER — ORAL CARE MOUTH RINSE: 15.0000 mL | OROMUCOSAL | Status: DC | PRN

## 2024-04-20 MED ORDER — TRAMADOL HCL 50 MG PO TABS
50.0000 mg | ORAL_TABLET | Freq: Three times a day (TID) | ORAL | Status: DC | PRN
  Administered 2024-04-23 – 2024-04-24 (×2): 50 mg via ORAL
  Filled 2024-04-20 (×2): qty 1

## 2024-04-20 MED ORDER — ALUM & MAG HYDROXIDE-SIMETH 200-200-20 MG/5ML PO SUSP
30.0000 mL | Freq: Four times a day (QID) | ORAL | Status: DC | PRN
  Administered 2024-04-20 – 2024-04-24 (×9): 30 mL via ORAL
  Filled 2024-04-20 (×9): qty 30

## 2024-04-20 MED ORDER — FUROSEMIDE 40 MG PO TABS
40.0000 mg | ORAL_TABLET | Freq: Every day | ORAL | Status: DC
  Administered 2024-04-21 – 2024-04-25 (×5): 40 mg via ORAL
  Filled 2024-04-20 (×5): qty 1

## 2024-04-20 MED ORDER — FUROSEMIDE 10 MG/ML IJ SOLN
40.0000 mg | Freq: Once | INTRAMUSCULAR | Status: AC
  Administered 2024-04-20: 40 mg via INTRAVENOUS
  Filled 2024-04-20: qty 4

## 2024-04-20 MED ORDER — SODIUM CHLORIDE 0.9% FLUSH
3.0000 mL | Freq: Two times a day (BID) | INTRAVENOUS | Status: DC
  Administered 2024-04-20 – 2024-04-25 (×10): 3 mL via INTRAVENOUS

## 2024-04-20 MED ORDER — SODIUM CHLORIDE 0.9 % IV SOLN: 2.0000 g | Freq: Three times a day (TID) | INTRAVENOUS | Status: DC

## 2024-04-20 MED ORDER — TRIMETHOBENZAMIDE HCL 100 MG/ML IM SOLN: 200.0000 mg | Freq: Four times a day (QID) | INTRAMUSCULAR | Status: DC | PRN

## 2024-04-20 MED ORDER — SODIUM CHLORIDE 0.9 % IV SOLN
2.0000 g | INTRAVENOUS | Status: DC
  Administered 2024-04-20 – 2024-04-24 (×5): 2 g via INTRAVENOUS
  Filled 2024-04-20 (×5): qty 20

## 2024-04-20 MED ORDER — FUROSEMIDE 40 MG PO TABS: 40.0000 mg | ORAL_TABLET | Freq: Every day | ORAL | Status: DC

## 2024-04-20 NOTE — Care Management Obs Status (Signed)
 MEDICARE OBSERVATION STATUS NOTIFICATION   Patient Details  Name: Ruford Dudzinski MRN: [id number] Date of Birth: 06-20-1966   Medicare Observation Status Notification Given:  Yes    Vonzell Arrie Sharps 04/20/2024, 10:47 AM

## 2024-04-20 NOTE — Plan of Care (Signed)
 Plan of Care Note for accepted transfer   Patient name: Donna Silverman FMW:[id number] DOB: Aug 19, 1966  Facility requesting transfer: [facility] ED Requesting Provider: Dr. Haze Facility course: 58 year old male with history of hypertension, cirrhosis with portal hypertension, alcohol abuse, C. difficile, GI bleed, PTSD, bipolar disorder, GERD, hospital admission 7/12-7/18/2025 for gram-positive bacteremia with possible endocarditis and was discharged on IV ceftriaxone  x 6 weeks.  He was also treated for acute gallstone pancreatitis/possible cholecystitis during this admission and was not a candidate for surgical intervention given cirrhosis.  Patient finished his antibiotic course yesterday and now over the past 24 hours has developed fatigue, chills, fever, cough, nausea, and malaise.  No leukocytosis, hemoglobin 9.4 (previously 11.2 on labs 2 weeks ago), platelet count 75k (previously 87k on labs 2 weeks ago), AST 76, T. bili 2.4 (improved), ALT and alk phos normal, lactic acid normal, lipase 139 (improved), proBNP 106, COVID/influenza/RSV PCR negative, UA not suggestive of infection, blood cultures drawn.  Chest x-ray showing streaky lower lobe opacities due to atelectasis or mild infiltrate.  Not hypoxic.  ED physician discussed the case with infectious disease and they recommended hospital admission for IV antibiotics including vancomycin  and cefepime  and repeat echocardiogram.  Patient was given fentanyl , Reglan , vancomycin , and cefepime .  Plan of care: The patient is accepted for admission to Telemetry unit at [facility].  [facility] will assume care on arrival to accepting facility. Until arrival, care as per EDP. However, [facility] available 24/7 for questions and assistance.  Check [email or web address] for on-call coverage.  Nursing staff, please call [facility] Admits & Consults System-Wide number under Amion on patient's arrival so appropriate admitting provider can evaluate the pt.

## 2024-04-20 NOTE — H&P (Addendum)
 History and Physical    Patient: Juan [name] FMW:[id number] DOB: 17-Jul-1966 DOA: 04/19/2024 DOS: the patient was seen and examined on 04/20/2024 PCP: Alvia Bring, DO  Patient coming from: Home  Chief Complaint:  Chief Complaint  Patient presents with   Fever   HPI: Juan [name] is a 58 y.o. male with medical history significant of hypertension, cirrhosis with portal hypertension, alcohol abuse, C. difficile, GI bleed, PTSD, bipolar disorder, and GERD presents with high fever.  Patient had just recently been hospitalized from 7/12-7/18 after presenting with fever noted to have blood cultures positive for streptococcus mitis/oralis with prior cultures in outside facility positive for strep paradanguinis.  Echocardiogram could not rule out aortic valve vegetation.  Repeat blood cultures were negative.  He was treated with 6 weeks of IV Rocephin  through PICC line which were completed 5 days ago.  Also during hospitalization there was concern for the possibility of gallstone pancreatitis/possible cholecystitis but not a surgical candidate given comorbidities.  He makes note that he was referred to [facility] as it was thought that this may have been the initial source for  the bacteremia.  He experienced a high fever of 101.18F yesterday, accompanied by indigestion and nausea. No stomach pain is present. He also has a slight cough, which he describes as 'nothing major'.  He recently completed a course of antibiotics, finishing the Rocephin  on Sunday and oral vancomycin  on Wednesday. He has a history of being treated with vancomycin , initially listed as an allergy, but later tolerated both IV and oral forms without side effects.  He is currently not taking spironolactone  and Lasix . He reports frequent urination. He notes swelling in his legs, which is a recurring issue.  In the ED patient was noted to be afebrile with blood pressures elevated up to 162/84, pulse 56-100, respirations 15-22, and O2  saturations maintained.  Labs noted no leukocytosis or left shift, hemoglobin 9.4 (previously 11.2 on labs 2 weeks ago), platelet count 75k (previously 87k on labs 2 weeks ago), AST 76, T. bili 2.4 (improved), ALT and alk phos normal, lactic acid normal, lipase 139 (improved), and proBNP 106.  Chest x-ray showing streaky lower lobe opacities due to atelectasis or mild infiltrate.  COVID/influenza/RSV PCR negative.  Urinalysis not suggestive of infection.  Blood cultures drawn.   Patient had initially been given empiric antibiotics of vancomycin  and cefepime .  Review of Systems: As mentioned in the history of present illness. All other systems reviewed and are negative. 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  Chronic GERD with HH   09/2019--EGD--[facility], [place]--showed no varices; gr D esophagitis;  Rx: PPI daily     Eustachian tube dysfunction 03/02/2023   Febrile illness 06/27/2023   Fracture of laryngeal cartilage (HCC) 01/17/2020   Last Assessment & Plan:   Formatting of this note might be different from the original.  Emergency department follow-up for evaluation of laryngeal trauma.  CT obtained the night of the injury is reviewed independently and shows nondisplaced fracture of the thyroid  cartilage.  In general, his voice is much improved from the night of the trauma.  Denies any difficulty breathing.  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 07/11/2020   Lower extremity edema 09/06/2022   Lumbar degenerative disc disease 07/26/2022   Malnutrition of moderate degree 01/11/2022   MDD (major depressive disorder), recurrent severe, 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   Nonrheumatic aortic valve stenosis    Normocytic anemia 03/02/2022   Pain of left calf 04/11/2023   Pancytopenia (HCC) 09/18/2021   Portal hypertensive gastropathy (HCC)    Post-traumatic osteoarthritis of right knee 01/04/2023   Prolonged QT interval 05/05/2022   PTSD (post-traumatic stress disorder)    Rib pain on right side 10/31/2022   Right ankle sprain 02/12/2021   Right lower quadrant pain 04/17/2021   Right wrist injury 10/18/2023   Shoulder pain,  left, posterior 12/22/2021   Sleep apnea    Substance abuse (HCC)    Systolic murmur 01/06/2021   Thrombocytopenia (HCC) 11/02/2020   Tibialis posterior tendinitis, right 04/14/2021   Tinea pedis 05/12/2021   Tobacco chew use 03/02/2022   Traumatic hemorrhagic shock (HCC)    Ulcer    Urinary frequency 03/23/2021   Well adult exam 01/06/2021   Past Surgical History:  Procedure Laterality Date   BIOPSY  09/24/2021   Procedure: BIOPSY;  Surgeon: Federico Rosario BROCKS, MD;  Location: [facility] ENDOSCOPY;  Service: Gastroenterology;;   ORIN MEDIATE RELEASE Bilateral    COLONOSCOPY WITH PROPOFOL  N/A 09/24/2021   Procedure: COLONOSCOPY WITH PROPOFOL ;  Surgeon: Federico Rosario BROCKS, MD;  Location: [facility] ENDOSCOPY;  Service: Gastroenterology;  Laterality: N/A;   ESOPHAGOGASTRODUODENOSCOPY N/A 03/06/2024   Procedure: EGD (ESOPHAGOGASTRODUODENOSCOPY);  Surgeon: Suzann Inocente HERO, MD;  Location: [facility] ENDOSCOPY;  Service: Gastroenterology;  Laterality: N/A;   ESOPHAGOGASTRODUODENOSCOPY (EGD) WITH PROPOFOL  N/A 09/24/2021   Procedure: ESOPHAGOGASTRODUODENOSCOPY (EGD) WITH PROPOFOL ;  Surgeon: Federico Rosario BROCKS, MD;  Location: [facility] ENDOSCOPY;  Service: Gastroenterology;  Laterality: N/A;   ESOPHAGOGASTRODUODENOSCOPY (EGD) WITH PROPOFOL  N/A 08/18/2022   Procedure: ESOPHAGOGASTRODUODENOSCOPY (EGD) WITH PROPOFOL ;  Surgeon: Abran Norleen SAILOR, MD;  Location: [facility] ENDOSCOPY;  Service: Gastroenterology;  Laterality: N/A;   FLEXIBLE SIGMOIDOSCOPY N/A 08/18/2022   Procedure:  FLEXIBLE SIGMOIDOSCOPY;  Surgeon: Abran Norleen SAILOR, MD;  Location: [facility] ENDOSCOPY;  Service: Gastroenterology;  Laterality: N/A;   FRACTURE SURGERY     left ankle plate   HERNIA REPAIR     inguinal   KNEE SURGERY Right    x 4   SHOULDER SURGERY Bilateral    x 2   TRANSESOPHAGEAL ECHOCARDIOGRAM (CATH LAB) N/A 03/07/2024   Procedure: TRANSESOPHAGEAL ECHOCARDIOGRAM;  Surgeon: Shlomo Wilbert SAUNDERS, MD;  Location: [facility] INVASIVE CV LAB;  Service: Cardiovascular;  Laterality: N/A;   VASECTOMY      Social History:  reports that he has never smoked. His smokeless tobacco use includes snuff. He reports that he does not currently use alcohol. He reports that he does not use drugs.  Allergies  Allergen Reactions   Apple Juice Swelling and Other (See Comments)    Tongue swelling   Cucumber Extract Itching and Nausea And Vomiting    No extracts; just cucumber   Depakote Er [Divalproex Sodium Er] Swelling and Other (See Comments)    Tongue swelling   Depakote [Valproic Acid] Anaphylaxis   Peanut Butter Flavoring Agent (Non-Screening) Anaphylaxis and Swelling   Peanut Oil Swelling   Peanut-Containing Drug Products Swelling   Shellfish Allergy Itching and Swelling   Shrimp Extract Itching and Swelling   Apple Swelling   Cantaloupe Extract Allergy Skin Test Rash   Codeine Itching, Rash and Other (See Comments)    Patient reports he can take CODONES without problems   Firvanq  [Vancomycin ] Rash    Pt is currently taking, pt can take in low doses but not high doses he turns red all over   Lactose Intolerance (Gi) Diarrhea and Other (See Comments)    Indigestion, Stomach pain, Flatulence    Family History  Problem Relation Age of Onset   Pulmonary fibrosis Mother    Hypertension Father    Other Father        liver failure   Diabetes Brother    Breast cancer Paternal Aunt    Colon cancer Neg Hx    Esophageal cancer Neg Hx    Stomach cancer Neg Hx    Rectal cancer Neg Hx     Prior to Admission medications   Medication Sig Start Date End Date Taking? Authorizing Provider  calcium  carbonate (TUMS - DOSED IN MG ELEMENTAL CALCIUM ) 500 MG chewable tablet Chew 3 tablets by mouth at bedtime.    [provider]  clotrimazole  (CLOTRIMAZOLE  ANTI-FUNGAL) 1 % cream Apply 1 Application topically 2 (two) times daily. 04/17/24   Alvia Bring, DO  ferrous sulfate  325 (65 FE) MG tablet Take 1 tablet (325 mg total) by mouth daily with breakfast. 03/10/24 06/17/24  Leotis Bogus, MD   furosemide  (LASIX ) 40 MG tablet Take 40 mg by mouth daily.    [provider]  hydrOXYzine  (ATARAX ) 10 MG tablet Take 1 tablet (10 mg total) by mouth every 8 (eight) hours as needed for itching (1- 2 pills for itching). 04/18/24   Craig Alan SAUNDERS, PA-C  lactulose , encephalopathy, (CHRONULAC ) 10 GM/15ML SOLN Take 10 g by mouth daily as needed (constipation).    [provider]  ondansetron  (ZOFRAN -ODT) 4 MG disintegrating tablet Take 1 tablet (4 mg total) by mouth every 8 (eight) hours as needed. 02/20/24   Long, Fonda MATSU, MD  pantoprazole  (PROTONIX ) 40 MG tablet Take 1 tablet (40 mg total) by mouth daily. 04/03/24 07/03/24  Alvia Bring, DO  QUEtiapine  (SEROQUEL ) 50 MG tablet Take 0.5-1 tablets (25-50 mg total)  by mouth at bedtime. Patient taking differently: Take 50 mg by mouth at bedtime. 01/18/24   Alvia Bring, DO  spironolactone  (ALDACTONE ) 100 MG tablet Take 1 tablet (100 mg total) by mouth daily. 02/15/24   Alvia Bring, DO  vancomycin  (VANCOCIN ) 125 MG capsule Take 1 capsule (125 mg total) by mouth in the morning and at bedtime. 03/01/24 05/02/24  Overton Constance DASEN, MD    Physical Exam: Vitals:   04/19/24 2300 04/19/24 2330 04/20/24 0045 04/20/24 0440  BP: (!) 145/84 (!) 143/82 127/81 130/74  Pulse: 97 93 100 81  Resp: 15 17 17 20   Temp:    98.4 F (36.9 C)  TempSrc:    Oral  SpO2: 92% 95% 92% 94%  Weight:    86 kg  Height:        Constitutional: Middle-age male currently in no acute distress Eyes: PERRL, lids and conjunctivae normal ENMT: Mucous membranes are moist. Normal dentition.  Neck: normal, supple, no masses, no thyromegaly Respiratory: clear to auscultation bilaterally, no wheezing, no crackles. Normal respiratory effort. No accessory muscle use.  Cardiovascular: Regular rate and rhythm with positive systolic murmur present.  At least +1 pitting bilateral lower extremity edema present. Abdomen:  Protuberant abdomen without significant fluid wave present  at this time. No tenderness. Bowel sounds positive.  Musculoskeletal: no clubbing / cyanosis. No joint deformity upper and lower extremities. Good ROM, no contractures. Normal muscle tone.  Skin: no rashes, lesions, ulcers. No induration Neurologic: CN 2-12 grossly intact. Sensation intact, DTR normal. Strength 5/5 in all 4.  Psychiatric: Normal judgment and insight. Alert and oriented x 3. Normal mood.   Data Reviewed:  EKG reveals sinus rhythm at 95 bpm with QTc 503 and ST-T wave changes.  Reviewed labs, imaging, and pertinent records as documented.  Assessment and Plan:  SIRS Patient reported having fever up to 101.8 F with mild tachypnea.  White blood cell count noted to be 6.7.  Patient had just recently been treated with IV antibiotics for 6 weeks for strep species bacteremia thought possibly secondary to endocarditis.  Patient had initially been treated with empiric antibiotics of vancomycin  and cefepmine. - Admit to a telemetry bed - Follow-up blood cultures - Check CRP, ESR, and other labs as noted below in investigation - Check repeat echocardiogram - Antibiotics and transition to Rocephin  IV after talks with Dr. Fleeta Rothman of ID  Abnormal chest x-ray Patient reported having a mild cough.  Chest x-ray showing streaky lower lobe opacities due to atelectasis or mild infiltrate.  COVID, influenza, and RSV screening were negative.  Question the possibility of community-acquired pneumonia - Check procalcitonin - Check complete respiratory virus panel  Normocytic anemia Acute on chronic.  Hemoglobin noted to be 9.4, but had previously been 11.2 when checked 2 weeks ago.  Baseline hemoglobin appears to be around 7-9 g/dL. - Recheck H&H  Prolonged QT interval QTc noted to be prolonged at 503.   - Recheck EKG - Avoid QT prolonging medications  History of liver cirrhosis Portal hypertension Patient noted to have  lower extremity swelling present.  History of decompensated cirrhosis.  He had not been taking Lasix  or spironolactone .   Patient notes that spironolactone  had been discontinued due to gynecomastia. Following with transplant clinic at [facility]. EGD done on 7/15 did not show any esophageal varices, but showed portal hypertensive gastropathy.  - Furosemide  40 mg IV x 1 dose, then resume Lasix  40 mg daily  Cholelithiasis Patient with prior history of cholelithiasis with  concern for cholecystitis versus gallstone pancreatitis during last hospitalization.  Patient was referred to [facility] as this was thought to be a possible source for his bacteremia.  Patient denies any complaints of abdominal pain at this time. - Continue outpatient follow-up with GI at [facility]  Thrombocytopenia Chronic.  Platelet count 75 which appears around patient's baseline.  Thought secondary to history of cirrhosis. - Continue to monitor  History alcohol abuse Patient currently is not drinking alcohol. - Encouraged continued cessation of alcohol use.  DVT prophylaxis: SCDs due to thrombocytopenia Advance Care Planning:   Code Status: Full Code   Consults: Infectious disease  Family Communication: Patient's wife updated over the phone.  Severity of Illness: The appropriate patient status for this patient is OBSERVATION. Observation status is judged to be reasonable and necessary in order to provide the required intensity of service to ensure the patient's safety. The patient's presenting symptoms, physical exam findings, and initial radiographic and laboratory data in the context of their medical condition is felt to place them at decreased risk for further clinical deterioration. Furthermore, it is anticipated that the patient will be medically stable for discharge from the hospital within 2 midnights of admission.   Author: Maximino DELENA Sharps, MD 04/20/2024 7:42 AM  For on call review [email or web address].

## 2024-04-20 NOTE — Progress Notes (Signed)
 Mobility Specialist Progress Note:    04/20/24 1429  Mobility  Activity Ambulated with assistance  Level of Assistance Contact guard assist, steadying assist  Assistive Device Front wheel walker  Distance Ambulated (ft) 200 ft  Activity Response Tolerated well  Mobility Referral Yes  Mobility visit 1 Mobility  Mobility Specialist Start Time (ACUTE ONLY) 1429  Mobility Specialist Stop Time (ACUTE ONLY) 1436  Mobility Specialist Time Calculation (min) (ACUTE ONLY) 7 min   Pt pleasant and eager to walk. Pt c/o being hot and cold but RN stated there was no concern for fever. Slight tenderness in legs d/t edema during ambulation but able to tolerate. Pt move well using RW. Returned pt to recliner w/ all needs met.   Venetia Keel Mobility Specialist Please Neurosurgeon or Rehab Office at [phone]

## 2024-04-20 NOTE — Plan of Care (Signed)

## 2024-04-21 ENCOUNTER — Observation Stay (HOSPITAL_COMMUNITY)

## 2024-04-21 ENCOUNTER — Observation Stay (HOSPITAL_BASED_OUTPATIENT_CLINIC_OR_DEPARTMENT_OTHER)

## 2024-04-21 DIAGNOSIS — K8020 Calculus of gallbladder without cholecystitis without obstruction: Secondary | ICD-10-CM | POA: Diagnosis present

## 2024-04-21 DIAGNOSIS — I350 Nonrheumatic aortic (valve) stenosis: Secondary | ICD-10-CM | POA: Diagnosis not present

## 2024-04-21 DIAGNOSIS — D649 Anemia, unspecified: Secondary | ICD-10-CM | POA: Diagnosis not present

## 2024-04-21 DIAGNOSIS — F4310 Post-traumatic stress disorder, unspecified: Secondary | ICD-10-CM | POA: Diagnosis present

## 2024-04-21 DIAGNOSIS — Z8619 Personal history of other infectious and parasitic diseases: Secondary | ICD-10-CM

## 2024-04-21 DIAGNOSIS — I8510 Secondary esophageal varices without bleeding: Secondary | ICD-10-CM | POA: Diagnosis present

## 2024-04-21 DIAGNOSIS — R509 Fever, unspecified: Secondary | ICD-10-CM

## 2024-04-21 DIAGNOSIS — I38 Endocarditis, valve unspecified: Secondary | ICD-10-CM

## 2024-04-21 DIAGNOSIS — Z833 Family history of diabetes mellitus: Secondary | ICD-10-CM | POA: Diagnosis not present

## 2024-04-21 DIAGNOSIS — Z5309 Procedure and treatment not carried out because of other contraindication: Secondary | ICD-10-CM | POA: Diagnosis not present

## 2024-04-21 DIAGNOSIS — I330 Acute and subacute infective endocarditis: Secondary | ICD-10-CM | POA: Diagnosis present

## 2024-04-21 DIAGNOSIS — F3181 Bipolar II disorder: Secondary | ICD-10-CM | POA: Diagnosis present

## 2024-04-21 DIAGNOSIS — E739 Lactose intolerance, unspecified: Secondary | ICD-10-CM | POA: Diagnosis present

## 2024-04-21 DIAGNOSIS — Z79899 Other long term (current) drug therapy: Secondary | ICD-10-CM | POA: Diagnosis not present

## 2024-04-21 DIAGNOSIS — K766 Portal hypertension: Secondary | ICD-10-CM | POA: Diagnosis present

## 2024-04-21 DIAGNOSIS — J189 Pneumonia, unspecified organism: Secondary | ICD-10-CM | POA: Diagnosis present

## 2024-04-21 DIAGNOSIS — I10 Essential (primary) hypertension: Secondary | ICD-10-CM | POA: Diagnosis present

## 2024-04-21 DIAGNOSIS — R7989 Other specified abnormal findings of blood chemistry: Secondary | ICD-10-CM | POA: Diagnosis present

## 2024-04-21 DIAGNOSIS — K7030 Alcoholic cirrhosis of liver without ascites: Secondary | ICD-10-CM | POA: Diagnosis present

## 2024-04-21 DIAGNOSIS — K7460 Unspecified cirrhosis of liver: Secondary | ICD-10-CM | POA: Diagnosis not present

## 2024-04-21 DIAGNOSIS — R6510 Systemic inflammatory response syndrome (SIRS) of non-infectious origin without acute organ dysfunction: Secondary | ICD-10-CM | POA: Diagnosis not present

## 2024-04-21 DIAGNOSIS — J9811 Atelectasis: Secondary | ICD-10-CM | POA: Diagnosis present

## 2024-04-21 DIAGNOSIS — F1020 Alcohol dependence, uncomplicated: Secondary | ICD-10-CM | POA: Diagnosis present

## 2024-04-21 DIAGNOSIS — K219 Gastro-esophageal reflux disease without esophagitis: Secondary | ICD-10-CM | POA: Diagnosis present

## 2024-04-21 DIAGNOSIS — K861 Other chronic pancreatitis: Secondary | ICD-10-CM | POA: Diagnosis not present

## 2024-04-21 DIAGNOSIS — D6959 Other secondary thrombocytopenia: Secondary | ICD-10-CM | POA: Diagnosis present

## 2024-04-21 DIAGNOSIS — Z881 Allergy status to other antibiotic agents status: Secondary | ICD-10-CM | POA: Diagnosis not present

## 2024-04-21 DIAGNOSIS — K3189 Other diseases of stomach and duodenum: Secondary | ICD-10-CM | POA: Diagnosis present

## 2024-04-21 DIAGNOSIS — Z1152 Encounter for screening for COVID-19: Secondary | ICD-10-CM | POA: Diagnosis not present

## 2024-04-21 DIAGNOSIS — R350 Frequency of micturition: Secondary | ICD-10-CM | POA: Diagnosis present

## 2024-04-21 DIAGNOSIS — F1011 Alcohol abuse, in remission: Secondary | ICD-10-CM | POA: Diagnosis not present

## 2024-04-21 DIAGNOSIS — D638 Anemia in other chronic diseases classified elsewhere: Secondary | ICD-10-CM | POA: Diagnosis present

## 2024-04-21 DIAGNOSIS — R9431 Abnormal electrocardiogram [ECG] [EKG]: Secondary | ICD-10-CM | POA: Diagnosis present

## 2024-04-21 DIAGNOSIS — R197 Diarrhea, unspecified: Secondary | ICD-10-CM | POA: Diagnosis not present

## 2024-04-21 DIAGNOSIS — Z8249 Family history of ischemic heart disease and other diseases of the circulatory system: Secondary | ICD-10-CM | POA: Diagnosis not present

## 2024-04-21 HISTORY — DX: Acute and subacute infective endocarditis: I33.0

## 2024-04-21 LAB — ECHOCARDIOGRAM LIMITED
AR max vel: 1.41 cm2
AV Area VTI: 1.55 cm2
AV Area mean vel: 1.52 cm2
AV Mean grad: 28 mmHg
AV Peak grad: 46.2 mmHg
Ao pk vel: 3.4 m/s
Height: 65 in
Weight: 3032 [oz_av]

## 2024-04-21 LAB — CBC
HCT: 28.6 % — ABNORMAL LOW (ref 39.0–52.0)
Hemoglobin: 9.8 g/dL — ABNORMAL LOW (ref 13.0–17.0)
MCH: 30.9 pg (ref 26.0–34.0)
MCHC: 34.3 g/dL (ref 30.0–36.0)
MCV: 90.2 fL (ref 80.0–100.0)
Platelets: 71 K/uL — ABNORMAL LOW (ref 150–400)
RBC: 3.17 MIL/uL — ABNORMAL LOW (ref 4.22–5.81)
RDW: 20.6 % — ABNORMAL HIGH (ref 11.5–15.5)
WBC: 6.7 K/uL (ref 4.0–10.5)
nRBC: 0 % (ref 0.0–0.2)

## 2024-04-21 LAB — COMPREHENSIVE METABOLIC PANEL WITH GFR
ALT: 24 U/L (ref 0–44)
AST: 65 U/L — ABNORMAL HIGH (ref 15–41)
Albumin: 2 g/dL — ABNORMAL LOW (ref 3.5–5.0)
Alkaline Phosphatase: 62 U/L (ref 38–126)
Anion gap: 9 (ref 5–15)
BUN: 11 mg/dL (ref 6–20)
CO2: 24 mmol/L (ref 22–32)
Calcium: 8.7 mg/dL — ABNORMAL LOW (ref 8.9–10.3)
Chloride: 104 mmol/L (ref 98–111)
Creatinine, Ser: 0.82 mg/dL (ref 0.61–1.24)
GFR, Estimated: >60 mL/min (ref >60)
Glucose, Bld: 91 mg/dL (ref 70–99)
Potassium: 3.3 mmol/L — ABNORMAL LOW (ref 3.5–5.1)
Sodium: 137 mmol/L (ref 135–145)
Total Bilirubin: 5.3 mg/dL — ABNORMAL HIGH (ref 0.0–1.2)
Total Protein: 5.6 g/dL — ABNORMAL LOW (ref 6.5–8.1)

## 2024-04-21 MED ORDER — POTASSIUM CHLORIDE 20 MEQ PO PACK
40.0000 meq | PACK | Freq: Once | ORAL | Status: AC
  Administered 2024-04-21: 40 meq via ORAL
  Filled 2024-04-21: qty 2

## 2024-04-21 MED ORDER — IOHEXOL 350 MG/ML SOLN
75.0000 mL | Freq: Once | INTRAVENOUS | Status: AC | PRN
  Administered 2024-04-21: 75 mL via INTRAVENOUS

## 2024-04-21 MED ORDER — VANCOMYCIN HCL 125 MG PO CAPS
125.0000 mg | ORAL_CAPSULE | Freq: Two times a day (BID) | ORAL | Status: DC
  Administered 2024-04-21 – 2024-04-24 (×7): 125 mg via ORAL
  Filled 2024-04-21 (×8): qty 1

## 2024-04-21 NOTE — Plan of Care (Signed)

## 2024-04-21 NOTE — Progress Notes (Signed)
 Echocardiogram 2D Echocardiogram has been performed.  Juan [name] Juan [name] RDCS 04/21/2024, 10:14 AM

## 2024-04-21 NOTE — Progress Notes (Signed)
 PROGRESS NOTE    Juan [name]  FMW:[id number] DOB: 1965-12-05 DOA: 04/19/2024 PCP: Alvia Bring, DO   Brief Narrative:  This 58 yrs .old Male with medical history significant of hypertension, cirrhosis with portal hypertension, alcohol abuse, C. difficile, GI bleed, PTSD, bipolar disorder, and GERD presents in the ED  with high fever.  Patient was recently hospitalized from 7/12-7/18 with fever noted to have blood cultures positive for streptococcus mitis/oralis with prior cultures in outside facility positive for strep paradanguinis.  Echocardiogram could not rule out aortic valve vegetation.  Repeat blood cultures were negative.  He was treated with 6 weeks of IV Rocephin  through PICC line which were completed 5 days ago.  Also during hospitalization there was concern for the possibility of gallstone pancreatitis/possible cholecystitis but not a surgical candidate given comorbidities.  He makes note that he was referred to [facility] as it was thought that this may have been the initial source for  the bacteremia.  Patient presented with fever cough and stomach pain.  In the ED pertinent labs include hemoglobin 9.4, platelet count 75K lactic acid normal, lipase 139 which is improved.  Chest x-ray shows lower lobe opacities due to atelectasis or mild infiltrate. Patient was admitted for further evaluation and started on empiric antibiotics.  Assessment & Plan:   Principal Problem:   SIRS (systemic inflammatory response syndrome) (HCC) Active Problems:   Abnormal chest x-ray   Normocytic anemia   Prolonged QT interval   Cirrhosis (HCC)   Cholelithiasis   Thrombocytopenia (HCC)   History of alcohol abuse  Suspected pneumonia: Patient presented with fever 101.8 at home with tachypnea.   Patient had just recently been treated with IV antibiotics for 6 weeks for strep species bacteremia thought possibly secondary to endocarditis.  Patient had initially been treated with empiric antibiotics of  vancomycin  and cefepmine. Follow-up blood cultures. CRP, ESR and procalcitonin unremarkable. Obtain repeat echocardiogram. Admitting MD has discussed the case with Dr. Fleeta dam recommended IV Rocephin .   Abnormal chest x-ray: Chest x-ray showing streaky lower lobe opacities due to atelectasis or mild infiltrate.   COVID, influenza, and RSV screening were negative.   Procalcitonin unremarkable. Check complete respiratory virus panel   Normocytic anemia: Acute on chronic.   Hemoglobin noted to be 9.4, but had previously been 11.2 when checked 2 weeks ago.   Baseline hemoglobin appears to be around 7-9 g/dL. - Recheck H&H   Prolonged QT interval: QTc noted to be prolonged at 503.   - Recheck EKG - Avoid QT prolonging medications   History of liver cirrhosis: Portal hypertension Patient noted to have  lower extremity swelling.   History of decompensated cirrhosis. He had not been taking Lasix  or spironolactone .    Patient notes that spironolactone  had been discontinued due to gynecomastia.  Following with transplant clinic at [facility].  EGD done on 7/15 did not show any esophageal varices, but showed portal hypertensive gastropathy.  - Furosemide  40 mg IV x 1 dose, then resume Lasix  40 mg daily   Cholelithiasis: Patient with prior history of cholelithiasis with concern for cholecystitis versus gallstone pancreatitis during last hospitalization.  Patient was referred to [facility] as this was thought to be a possible source for his bacteremia.  Patient denies any complaints of abdominal pain at this time. Continue outpatient follow-up with GI at [facility]   Thrombocytopenia: Chronic.  Platelet count 75 which appears around patient's baseline.  Thought secondary to history of cirrhosis. - Continue to monitor   History alcohol abuse:  Patient currently is not drinking alcohol. - Encouraged continued cessation of alcohol use.    DVT prophylaxis: SCDs Code Status:Full code Family Communication:  No family at bed side Disposition Plan:    Status is: Observation The patient remains OBS appropriate and will d/c before 2 midnights.   Admitted for community-acquired pneumonia started on empiric antibiotics.  Consultants:  None  Procedures: Echocardiogram  Antimicrobials:  Anti-infectives (From admission, onward)    Start     Dose/Rate Route Frequency Ordered Stop   04/20/24 1445  cefTRIAXone  (ROCEPHIN ) 2 g in sodium chloride  0.9 % 100 mL IVPB        2 g 200 mL/hr over 30 Minutes Intravenous Every 24 hours 04/20/24 1347     04/20/24 1400  ceFEPIme  (MAXIPIME ) 2 g in sodium chloride  0.9 % 100 mL IVPB  Status:  Discontinued        2 g 200 mL/hr over 30 Minutes Intravenous Every 8 hours 04/20/24 1312 04/20/24 1347   04/20/24 0015  vancomycin  (VANCOCIN ) IVPB 1000 mg/200 mL premix       Placed in Followed by Linked Group   1,000 mg 200 mL/hr over 60 Minutes Intravenous  Once 04/19/24 2236 04/20/24 0130   04/19/24 2300  vancomycin  (VANCOCIN ) IVPB 1000 mg/200 mL premix       Placed in Followed by Linked Group   1,000 mg 200 mL/hr over 60 Minutes Intravenous  Once 04/19/24 2236 04/20/24 0028   04/19/24 2245  ceFEPIme  (MAXIPIME ) 2 g in sodium chloride  0.9 % 100 mL IVPB        2 g 200 mL/hr over 30 Minutes Intravenous  Once 04/19/24 2236 04/19/24 2337       Subjective: Patient was seen and examined at bedside.  Overnight events noted. Patient reports feeling improved,  He underwent echocardiogram,  reports still having mild cough but denies any fever.  Objective: Vitals:   04/20/24 2045 04/20/24 2315 04/21/24 0516 04/21/24 0908  BP: 121/67 123/61 123/77 124/76  Pulse: 77 80 77   Resp: 16 20 18    Temp: 98.4 F (36.9 C) 98.1 F (36.7 C) 97.9 F (36.6 C) 98.5 F (36.9 C)  TempSrc: Oral Oral Oral Axillary  SpO2: 98% 99% 97%   Weight:      Height:        Intake/Output Summary (Last 24 hours) at 04/21/2024 1133 Last data filed at 04/21/2024 0913 Gross per 24 hour   Intake 937 ml  Output 3450 ml  Net -2513 ml   Filed Weights   04/19/24 2035 04/20/24 0440  Weight: 87.9 kg 86 kg    Examination:  General exam: Appears calm and comfortable, not in any acute distress. Respiratory system: CTA B/L. Respiratory effort normal. RR 16 Cardiovascular system: S1 & S2 heard, RRR. No JVD, murmurs, rubs, gallops or clicks. Gastrointestinal system: Abdomen is non distended, soft and non tender. Normal bowel sounds heard. Central nervous system: Alert and oriented X 3. No focal neurological deficits. Extremities: No edema, no cyanosis, no clubbing. Skin: No rashes, lesions or ulcers Psychiatry: Judgement and insight appear normal. Mood & affect appropriate.     Data Reviewed: I have personally reviewed following labs and imaging studies  CBC: Recent Labs  Lab 04/19/24 2100 04/20/24 0820 04/21/24 0435  WBC 6.7  --  6.7  NEUTROABS 3.2  --   --   HGB 9.4* 8.9* 9.8*  HCT 27.9* 26.0* 28.6*  MCV 90.6  --  90.2  PLT 75*  --  71*  Basic Metabolic Panel: Recent Labs  Lab 04/19/24 2100 04/21/24 0435  NA 140 137  K 4.2 3.3*  CL 108 104  CO2 24 24  GLUCOSE 107* 91  BUN 16 11  CREATININE 1.05 0.82  CALCIUM  8.4* 8.7*   GFR: Estimated Creatinine Clearance: 101.4 mL/min (by C-G formula based on SCr of 0.82 mg/dL). Liver Function Tests: Recent Labs  Lab 04/19/24 2100 04/21/24 0435  AST 76* 65*  ALT 26 24  ALKPHOS 90 62  BILITOT 2.4* 5.3*  PROT 5.4* 5.6*  ALBUMIN 2.7* 2.0*   Recent Labs  Lab 04/19/24 2100  LIPASE 139*   No results for input(s): AMMONIA in the last 168 hours. Coagulation Profile: No results for input(s): INR, PROTIME in the last 168 hours. Cardiac Enzymes: No results for input(s): CKTOTAL, CKMB, CKMBINDEX, TROPONINI in the last 168 hours. BNP (last 3 results) Recent Labs    02/20/24 2159 04/19/24 2100  PROBNP 229.0 106.0   HbA1C: No results for input(s): HGBA1C in the last 72 hours. CBG: No  results for input(s): GLUCAP in the last 168 hours. Lipid Profile: No results for input(s): CHOL, HDL, LDLCALC, TRIG, CHOLHDL, LDLDIRECT in the last 72 hours. Thyroid  Function Tests: No results for input(s): TSH, T4TOTAL, FREET4, T3FREE, THYROIDAB in the last 72 hours. Anemia Panel: No results for input(s): VITAMINB12, FOLATE, FERRITIN, TIBC, IRON, RETICCTPCT in the last 72 hours. Sepsis Labs: Recent Labs  Lab 04/19/24 2100 04/20/24 0820  PROCALCITON  --  <0.10  LATICACIDVEN 1.6  --     Recent Results (from the past 240 hours)  Resp panel by RT-PCR (RSV, Flu A&B, Covid) Anterior Nasal Swab     Status: None   Collection Time: 04/19/24  9:51 PM   Specimen: Anterior Nasal Swab  Result Value Ref Range Status   SARS Coronavirus 2 by RT PCR NEGATIVE NEGATIVE Final    Comment: (NOTE) SARS-CoV-2 target nucleic acids are NOT DETECTED.  The SARS-CoV-2 RNA is generally detectable in upper respiratory specimens during the acute phase of infection. The lowest concentration of SARS-CoV-2 viral copies this assay can detect is 138 copies/mL. A negative result does not preclude SARS-Cov-2 infection and should not be used as the sole basis for treatment or other patient management decisions. A negative result may occur with  improper specimen collection/handling, submission of specimen other than nasopharyngeal swab, presence of viral mutation(s) within the areas targeted by this assay, and inadequate number of viral copies(<138 copies/mL). A negative result must be combined with clinical observations, patient history, and epidemiological information. The expected result is Negative.  Fact Sheet for Patients:  BloggerCourse.com  Fact Sheet for Healthcare Providers:  SeriousBroker.it  This test is no t yet approved or cleared by the [place]  FDA and  has been authorized for detection and/or diagnosis  of SARS-CoV-2 by FDA under an Emergency Use Authorization (EUA). This EUA will remain  in effect (meaning this test can be used) for the duration of the COVID-19 declaration under Section 564(b)(1) of the Act, 21 U.S.C.section 360bbb-3(b)(1), unless the authorization is terminated  or revoked sooner.       Influenza A by PCR NEGATIVE NEGATIVE Final   Influenza B by PCR NEGATIVE NEGATIVE Final    Comment: (NOTE) The Xpert Xpress SARS-CoV-2/FLU/RSV plus assay is intended as an aid in the diagnosis of influenza from Nasopharyngeal swab specimens and should not be used as a sole basis for treatment. Nasal washings and aspirates are unacceptable for Xpert Xpress SARS-CoV-2/FLU/RSV testing.  Fact  Sheet for Patients: BloggerCourse.com  Fact Sheet for Healthcare Providers: SeriousBroker.it  This test is not yet approved or cleared by the [place]  FDA and has been authorized for detection and/or diagnosis of SARS-CoV-2 by FDA under an Emergency Use Authorization (EUA). This EUA will remain in effect (meaning this test can be used) for the duration of the COVID-19 declaration under Section 564(b)(1) of the Act, 21 U.S.C. section 360bbb-3(b)(1), unless the authorization is terminated or revoked.     Resp Syncytial Virus by PCR NEGATIVE NEGATIVE Final    Comment: (NOTE) Fact Sheet for Patients: BloggerCourse.com  Fact Sheet for Healthcare Providers: SeriousBroker.it  This test is not yet approved or cleared by the [place]  FDA and has been authorized for detection and/or diagnosis of SARS-CoV-2 by FDA under an Emergency Use Authorization (EUA). This EUA will remain in effect (meaning this test can be used) for the duration of the COVID-19 declaration under Section 564(b)(1) of the Act, 21 U.S.C. section 360bbb-3(b)(1), unless the authorization is terminated  or revoked.  Performed at [facility], [address] [place], [place] [address]   Blood culture (routine x 2)     Status: None (Preliminary result)   Collection Time: 04/19/24 10:04 PM   Specimen: BLOOD  Result Value Ref Range Status   Specimen Description   Final    BLOOD LEFT ANTECUBITAL Performed at [facility], [address] [place], [place] [address]    Special Requests   Final    BOTTLES DRAWN AEROBIC AND ANAEROBIC Blood Culture adequate volume Performed at [facility], [address] [place], [place] [address]    Culture   Final    NO GROWTH < 24 HOURS Performed at [facility], [address], [place], [place] [address]    Report Status PENDING  Incomplete  Blood culture (routine x 2)     Status: None (Preliminary result)   Collection Time: 04/19/24 10:30 PM   Specimen: BLOOD LEFT HAND  Result Value Ref Range Status   Specimen Description   Final    BLOOD LEFT HAND Performed at [facility], [address] [place], [place] [address]    Special Requests   Final    BOTTLES DRAWN AEROBIC AND ANAEROBIC Blood Culture results may not be optimal due to an inadequate volume of blood received in culture bottles Performed at [facility], [address] [place], [place] [address]    Culture   Final    NO GROWTH < 24 HOURS Performed at [facility], [address], [place], [place] [address]    Report Status PENDING  Incomplete  Respiratory (~20 pathogens) panel by PCR     Status: None   Collection Time: 04/20/24  8:30 AM   Specimen: Nasopharyngeal Swab; Respiratory  Result Value Ref Range Status   Adenovirus NOT DETECTED NOT DETECTED Final   Coronavirus 229E NOT DETECTED NOT DETECTED Final    Comment: (NOTE) The Coronavirus on the Respiratory Panel, DOES NOT test for the novel  Coronavirus (2019 nCoV)    Coronavirus HKU1 NOT DETECTED NOT DETECTED Final   Coronavirus NL63 NOT DETECTED NOT  DETECTED Final   Coronavirus OC43 NOT DETECTED NOT DETECTED Final   Metapneumovirus NOT DETECTED NOT DETECTED Final   Rhinovirus / Enterovirus NOT DETECTED NOT DETECTED Final   Influenza A NOT DETECTED NOT DETECTED Final   Influenza B NOT DETECTED NOT DETECTED  Final   Parainfluenza Virus 1 NOT DETECTED NOT DETECTED Final   Parainfluenza Virus 2 NOT DETECTED NOT DETECTED Final   Parainfluenza Virus 3 NOT DETECTED NOT DETECTED Final   Parainfluenza Virus 4 NOT DETECTED NOT DETECTED Final   Respiratory Syncytial Virus NOT DETECTED NOT DETECTED Final   Bordetella pertussis NOT DETECTED NOT DETECTED Final   Bordetella Parapertussis NOT DETECTED NOT DETECTED Final   Chlamydophila pneumoniae NOT DETECTED NOT DETECTED Final   Mycoplasma pneumoniae NOT DETECTED NOT DETECTED Final    Comment: Performed at [facility], [address], [place], [place] [address]    Radiology Studies: DG Chest 2 View Result Date: 04/19/2024 CLINICAL DATA:  Cough EXAM: CHEST - 2 VIEW COMPARISON:  02/20/2024 FINDINGS: Streaky lower lobe opacities due to atelectasis or mild infiltrate. No pleural effusion. Normal cardiac size. No pneumothorax IMPRESSION: Streaky lower lobe opacities due to atelectasis or mild infiltrate. Electronically Signed   By: Luke Bun M.D.   On: 04/19/2024 21:00   Scheduled Meds:  furosemide   40 mg Oral Daily   sodium chloride  flush  3 mL Intravenous Q12H   Continuous Infusions:  cefTRIAXone  (ROCEPHIN )  IV Stopped (04/20/24 1452)     LOS: 0 days    Time spent: 50 Mins    Darcel Dawley, MD [facility]   If 7PM-7AM, please contact night-coverage

## 2024-04-21 NOTE — Consult Note (Signed)
 Date of Admission:  04/19/2024          Reason for Consult:Fevers with concern for recurence of bacteremia from endocarditis 2 days after stopping IV abx     Referring Provider: Darcel Jumper, MD   Assessment:  Recurrent fevers and chills within 48 hours after completing antibiotics for endocarditis raising high suspicion that he may have recurrence of his bacteremia due to endocarditis Rule out recurrence of cholecystitis intra-abdominal infection History of C. difficile colitis History of alcoholic cirrhosis but fortunately with no esophageal varices on recent EGD History of C. difficile colitis  Plan:  Continue ceftriaxone  Follow-up 2D echocardiogram Follow-up blood cultures CT abdomen pelvis with contrast He will likely need a repeat TEE when Cardiology can do this Vancomycin  for C diff prevention   I will followup on his culture data, radiographic and echocardiographic data and check in again on him on Monday.  Principal Problem:   Bacterial endocarditis Active Problems:   History of alcohol abuse   Thrombocytopenia (HCC)   Normocytic anemia   Prolonged QT interval   Cirrhosis (HCC)   SIRS (systemic inflammatory response syndrome) (HCC)   Abnormal chest x-ray   Cholelithiasis   Scheduled Meds:  furosemide   40 mg Oral Daily   sodium chloride  flush  3 mL Intravenous Q12H   Continuous Infusions:  cefTRIAXone  (ROCEPHIN )  IV Stopped (04/20/24 1452)   PRN Meds:.alum & mag hydroxide-simeth, mouth rinse, traMADol , trimethobenzamide   HPI: Juan [name] is a 58 y.o. male with complicated past medical history including history of alcoholic cirrhosis and (has abstained for alcohol from some time now) gallstone pancreatitis who was admitted as a transfer patient from outside facility with Streptococcus para sanguinous bacteremia.  He was found to have endocarditis on TEE that the vegetation does not appear to have been described as being large on TEE.   Thoracic surgery did not find him to be a suitable candidate for valve replacement.  I believe this is part due to his cirrhosis.  He similarly was going to be referred to [facility] to address his biliary pathology as this was not felt to be within the scope of practice of our surgeons here in [place].  He had just finished a course of ceftriaxone  and PICC line was pulled on 24 August.  Within 2 days he developed shaking rigors and chills and he was found to have a temperature of 101.8 when his wife took his temperature.  He came to the ER where blood cultures have been taken so far without growth though with only less than 24 hours of incubation.  He had a respiratory panel that was done that was negative as well.  He was initially placed on vancomycin  and cefepime  but I narrowed him to ceftriaxone .  He does have a history of C. difficile colitis and previously was on vancomycin  prophylaxis while on ceftriaxone  so I will place him back on that.  My suspicion is high that this is infectious endocarditis but is certainly possible that he could have something going on in his abdomen and biliary tree that could have been suppressed with the ceftriaxone  as well.  Therefore I will get a CT of the abdomen pelvis with contrast   I have personally spent 83 minutes involved in face-to-face and non-face-to-face activities for this patient on the day of the visit. Professional time spent includes the following activities: Preparing to see the patient (review of tests), Obtaining and/or reviewing separately obtained history (admission/discharge record),  Performing a medically appropriate examination and/or evaluation , Ordering medications/tests/procedures, referring and communicating with other health care professionals, Documenting clinical information in the EMR, Independently interpreting results (not separately reported), Communicating results to the patient/family/caregiver, Counseling and educating the  patient/family/caregiver and Care coordination (not separately reported).   Evaluation of the patient requires complex antimicrobial therapy evaluation, counseling , isolation needs to reduce disease transmission and risk assessment and mitigation.     Review of Systems: Review of Systems  Constitutional:  Positive for chills, fever and malaise/fatigue. Negative for weight loss.  HENT:  Negative for congestion and sore throat.   Eyes:  Negative for blurred vision and photophobia.  Respiratory:  Negative for cough, shortness of breath and wheezing.   Cardiovascular:  Negative for chest pain, palpitations and leg swelling.  Gastrointestinal:  Negative for abdominal pain, blood in stool, constipation, diarrhea, heartburn, melena, nausea and vomiting.  Genitourinary:  Negative for dysuria, flank pain and hematuria.  Musculoskeletal:  Negative for back pain, falls, joint pain and myalgias.  Skin:  Negative for itching and rash.  Neurological:  Negative for dizziness, focal weakness, loss of consciousness, weakness and headaches.  Endo/Heme/Allergies:  Does not bruise/bleed easily.  Psychiatric/Behavioral:  Negative for depression and suicidal ideas. The patient does not have insomnia.     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  Chronic GERD with HH  09/2019--EGD--[facility], [place]--showed no varices; gr D esophagitis;  Rx: PPI daily     Eustachian tube dysfunction 03/02/2023   Febrile illness 06/27/2023   Fracture of laryngeal cartilage (HCC) 01/17/2020   Last Assessment & Plan:   Formatting of this note might be different from the original.  Emergency department follow-up for evaluation of laryngeal trauma.  CT obtained the night of the injury is reviewed independently and shows nondisplaced fracture of the thyroid  cartilage.  In general, his voice is much improved from the night of the trauma.  Denies any difficulty breathing.  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 07/11/2020   Lower extremity edema 09/06/2022   Lumbar degenerative disc disease 07/26/2022   Malnutrition of moderate degree 01/11/2022   MDD (major  depressive disorder), recurrent severe, 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   Nonrheumatic aortic valve stenosis    Normocytic anemia 03/02/2022   Pain of left calf 04/11/2023   Pancytopenia (HCC) 09/18/2021   Portal hypertensive gastropathy (HCC)    Post-traumatic osteoarthritis of right knee 01/04/2023   Prolonged QT interval 05/05/2022   PTSD (post-traumatic stress disorder)    Rib pain on right side 10/31/2022   Right ankle sprain 02/12/2021   Right lower quadrant pain 04/17/2021   Right wrist injury 10/18/2023   Shoulder pain, left, posterior 12/22/2021   Sleep apnea    Substance abuse (HCC)    Systolic murmur 01/06/2021   Thrombocytopenia (HCC) 11/02/2020   Tibialis posterior tendinitis, right 04/14/2021   Tinea pedis 05/12/2021   Tobacco chew use 03/02/2022   Traumatic hemorrhagic shock (HCC)    Ulcer    Urinary frequency 03/23/2021   Well adult exam 01/06/2021    Social History   Tobacco Use   Smoking status: Never   Smokeless tobacco: Current    Types: Snuff  Vaping Use   Vaping status: Never Used  Substance Use Topics   Alcohol use: Not Currently   Drug use: Never    Family History  Problem Relation Age of Onset   Pulmonary fibrosis Mother    Hypertension Father    Other Father        liver failure   Diabetes Brother    Breast cancer Paternal Aunt    Colon cancer Neg Hx    Esophageal cancer Neg Hx    Stomach cancer Neg Hx    Rectal cancer Neg Hx    Allergies  Allergen Reactions   Cucumber Extract Itching and Nausea And Vomiting   Depakote Er [Divalproex Sodium Er] Swelling    Tongue swelling   Depakote [Valproic Acid] Anaphylaxis   Peanut (Diagnostic) Anaphylaxis and Swelling    Reaction to peanut butter, specifically   Peanut Oil Swelling   Shellfish Allergy Itching and Swelling    Shrimp, specifically   Codeine Itching and Rash    OK to take hydrocodone /oxycodone  with no issues   Firvanq  [Vancomycin ] Itching and Other (See Comments)     Redman syndrome after receiving via IV (unknown date, a few years ago) Patient complains of severe itching after being switched to IV formulation on 04/19/24 OK to take tablet formulation, no issues.     Lactose Intolerance (Gi) Diarrhea and Other (See Comments)    Bloating, flatulence Indigestion Stomach pain   Tylenol  [Acetaminophen ] Other (See Comments)    Hx of cirrhosis   Cantaloupe (Diagnostic) Rash    OBJECTIVE: Blood pressure 124/76, pulse 77, temperature 98.5 F (36.9 C), temperature source Axillary, resp. rate 18, height 5' 5 (1.651 m), weight 86 kg, SpO2 97%.  Physical Exam Constitutional:      Appearance: He is well-developed.  HENT:     Head: Normocephalic and atraumatic.  Eyes:     Conjunctiva/sclera: Conjunctivae normal.  Cardiovascular:     Rate and Rhythm: Normal rate and regular rhythm.  Heart sounds: Murmur heard.  Pulmonary:     Effort: Pulmonary effort is normal. No respiratory distress.     Breath sounds: No stridor. No wheezing or rhonchi.  Abdominal:     General: There is no distension.     Palpations: Abdomen is soft.     Tenderness: There is no abdominal tenderness.  Musculoskeletal:        General: No tenderness. Normal range of motion.     Cervical back: Normal range of motion and neck supple.  Skin:    General: Skin is warm and dry.     Coloration: Skin is not pale.     Findings: No erythema or rash.  Neurological:     General: No focal deficit present.     Mental Status: He is alert and oriented to person, place, and time.  Psychiatric:        Mood and Affect: Mood normal.        Behavior: Behavior normal.        Thought Content: Thought content normal.        Judgment: Judgment normal.     Lab Results Lab Results  Component Value Date   WBC 6.7 04/21/2024   HGB 9.8 (L) 04/21/2024   HCT 28.6 (L) 04/21/2024   MCV 90.2 04/21/2024   PLT 71 (L) 04/21/2024    Lab Results  Component Value Date   CREATININE 0.82 04/21/2024    BUN 11 04/21/2024   NA 137 04/21/2024   K 3.3 (L) 04/21/2024   CL 104 04/21/2024   CO2 24 04/21/2024    Lab Results  Component Value Date   ALT 24 04/21/2024   AST 65 (H) 04/21/2024   ALKPHOS 62 04/21/2024   BILITOT 5.3 (H) 04/21/2024     Microbiology: Recent Results (from the past 240 hours)  Resp panel by RT-PCR (RSV, Flu A&B, Covid) Anterior Nasal Swab     Status: None   Collection Time: 04/19/24  9:51 PM   Specimen: Anterior Nasal Swab  Result Value Ref Range Status   SARS Coronavirus 2 by RT PCR NEGATIVE NEGATIVE Final    Comment: (NOTE) SARS-CoV-2 target nucleic acids are NOT DETECTED.  The SARS-CoV-2 RNA is generally detectable in upper respiratory specimens during the acute phase of infection. The lowest concentration of SARS-CoV-2 viral copies this assay can detect is 138 copies/mL. A negative result does not preclude SARS-Cov-2 infection and should not be used as the sole basis for treatment or other patient management decisions. A negative result may occur with  improper specimen collection/handling, submission of specimen other than nasopharyngeal swab, presence of viral mutation(s) within the areas targeted by this assay, and inadequate number of viral copies(<138 copies/mL). A negative result must be combined with clinical observations, patient history, and epidemiological information. The expected result is Negative.  Fact Sheet for Patients:  BloggerCourse.com  Fact Sheet for Healthcare Providers:  SeriousBroker.it  This test is no t yet approved or cleared by the [place]  FDA and  has been authorized for detection and/or diagnosis of SARS-CoV-2 by FDA under an Emergency Use Authorization (EUA). This EUA will remain  in effect (meaning this test can be used) for the duration of the COVID-19 declaration under Section 564(b)(1) of the Act, 21 U.S.C.section 360bbb-3(b)(1), unless the authorization is  terminated  or revoked sooner.       Influenza A by PCR NEGATIVE NEGATIVE Final   Influenza B by PCR NEGATIVE NEGATIVE Final    Comment: (NOTE) The  Xpert Xpress SARS-CoV-2/FLU/RSV plus assay is intended as an aid in the diagnosis of influenza from Nasopharyngeal swab specimens and should not be used as a sole basis for treatment. Nasal washings and aspirates are unacceptable for Xpert Xpress SARS-CoV-2/FLU/RSV testing.  Fact Sheet for Patients: BloggerCourse.com  Fact Sheet for Healthcare Providers: SeriousBroker.it  This test is not yet approved or cleared by the [place]  FDA and has been authorized for detection and/or diagnosis of SARS-CoV-2 by FDA under an Emergency Use Authorization (EUA). This EUA will remain in effect (meaning this test can be used) for the duration of the COVID-19 declaration under Section 564(b)(1) of the Act, 21 U.S.C. section 360bbb-3(b)(1), unless the authorization is terminated or revoked.     Resp Syncytial Virus by PCR NEGATIVE NEGATIVE Final    Comment: (NOTE) Fact Sheet for Patients: BloggerCourse.com  Fact Sheet for Healthcare Providers: SeriousBroker.it  This test is not yet approved or cleared by the [place]  FDA and has been authorized for detection and/or diagnosis of SARS-CoV-2 by FDA under an Emergency Use Authorization (EUA). This EUA will remain in effect (meaning this test can be used) for the duration of the COVID-19 declaration under Section 564(b)(1) of the Act, 21 U.S.C. section 360bbb-3(b)(1), unless the authorization is terminated or revoked.  Performed at [facility], [address] [place], [place] [address]   Blood culture (routine x 2)     Status: None (Preliminary result)   Collection Time: 04/19/24 10:04 PM   Specimen: BLOOD  Result Value Ref Range Status   Specimen Description   Final     BLOOD LEFT ANTECUBITAL Performed at [facility], [address] [place], [place] [address]    Special Requests   Final    BOTTLES DRAWN AEROBIC AND ANAEROBIC Blood Culture adequate volume Performed at [facility], [address] [place], [place] [address]    Culture   Final    NO GROWTH < 24 HOURS Performed at [facility], [address] [place], [place] [address]    Report Status PENDING  Incomplete  Blood culture (routine x 2)     Status: None (Preliminary result)   Collection Time: 04/19/24 10:30 PM   Specimen: BLOOD LEFT HAND  Result Value Ref Range Status   Specimen Description   Final    BLOOD LEFT HAND Performed at [facility], [address] [place], [place] [address]    Special Requests   Final    BOTTLES DRAWN AEROBIC AND ANAEROBIC Blood Culture results may not be optimal due to an inadequate volume of blood received in culture bottles Performed at [facility], [address] [place], [place] [address]    Culture   Final    NO GROWTH < 24 HOURS Performed at [facility], [address], [place], [place] [address]    Report Status PENDING  Incomplete  Respiratory (~20 pathogens) panel by PCR     Status: None   Collection Time: 04/20/24  8:30 AM   Specimen: Nasopharyngeal Swab; Respiratory  Result Value Ref Range Status   Adenovirus NOT DETECTED NOT DETECTED Final   Coronavirus 229E NOT DETECTED NOT DETECTED Final    Comment: (NOTE) The Coronavirus on the Respiratory Panel, DOES NOT test for the novel  Coronavirus (2019 nCoV)    Coronavirus HKU1 NOT DETECTED NOT DETECTED Final   Coronavirus NL63 NOT DETECTED NOT DETECTED Final  Coronavirus OC43 NOT DETECTED NOT DETECTED Final   Metapneumovirus NOT DETECTED NOT DETECTED Final   Rhinovirus / Enterovirus NOT DETECTED NOT DETECTED Final   Influenza A NOT DETECTED NOT DETECTED Final   Influenza B NOT DETECTED NOT DETECTED Final   Parainfluenza Virus 1  NOT DETECTED NOT DETECTED Final   Parainfluenza Virus 2 NOT DETECTED NOT DETECTED Final   Parainfluenza Virus 3 NOT DETECTED NOT DETECTED Final   Parainfluenza Virus 4 NOT DETECTED NOT DETECTED Final   Respiratory Syncytial Virus NOT DETECTED NOT DETECTED Final   Bordetella pertussis NOT DETECTED NOT DETECTED Final   Bordetella Parapertussis NOT DETECTED NOT DETECTED Final   Chlamydophila pneumoniae NOT DETECTED NOT DETECTED Final   Mycoplasma pneumoniae NOT DETECTED NOT DETECTED Final    Comment: Performed at [facility], [address] [place], [place] [address]    Jomarie Fleeta Rothman, MD [facility] [phone] pager  04/21/2024, 12:12 PM

## 2024-04-22 DIAGNOSIS — I330 Acute and subacute infective endocarditis: Principal | ICD-10-CM

## 2024-04-22 DIAGNOSIS — K861 Other chronic pancreatitis: Secondary | ICD-10-CM

## 2024-04-22 DIAGNOSIS — R197 Diarrhea, unspecified: Secondary | ICD-10-CM

## 2024-04-22 HISTORY — DX: Other chronic pancreatitis: K86.1

## 2024-04-22 MED ORDER — SACCHAROMYCES BOULARDII 250 MG PO CAPS
250.0000 mg | ORAL_CAPSULE | Freq: Two times a day (BID) | ORAL | Status: DC
  Administered 2024-04-22 – 2024-04-24 (×5): 250 mg via ORAL
  Filled 2024-04-22 (×6): qty 1

## 2024-04-22 MED ORDER — LOPERAMIDE HCL 2 MG PO CAPS
2.0000 mg | ORAL_CAPSULE | ORAL | Status: AC | PRN
Start: 2024-04-22 — End: 2024-04-22
  Administered 2024-04-22 (×3): 2 mg via ORAL
  Filled 2024-04-22 (×3): qty 1

## 2024-04-22 MED ORDER — CLOTRIMAZOLE 1 % EX CREA
1.0000 | TOPICAL_CREAM | Freq: Two times a day (BID) | CUTANEOUS | Status: DC | PRN
  Administered 2024-04-23: 1 via TOPICAL
  Filled 2024-04-22: qty 15

## 2024-04-22 MED ORDER — POTASSIUM CHLORIDE 20 MEQ PO PACK
40.0000 meq | PACK | Freq: Once | ORAL | Status: AC
  Administered 2024-04-22: 40 meq via ORAL
  Filled 2024-04-22: qty 2

## 2024-04-22 MED ORDER — MELATONIN 5 MG PO TABS
5.0000 mg | ORAL_TABLET | Freq: Every evening | ORAL | Status: DC | PRN
  Administered 2024-04-22 – 2024-04-24 (×2): 5 mg via ORAL
  Filled 2024-04-22 (×3): qty 1

## 2024-04-22 MED ORDER — HYDROXYZINE HCL 10 MG PO TABS
10.0000 mg | ORAL_TABLET | Freq: Three times a day (TID) | ORAL | Status: DC | PRN
  Filled 2024-04-22: qty 1

## 2024-04-22 MED ORDER — HYDROCORTISONE 1 % EX CREA
TOPICAL_CREAM | Freq: Four times a day (QID) | CUTANEOUS | Status: DC | PRN
  Administered 2024-04-22: 1 via TOPICAL
  Filled 2024-04-22: qty 28

## 2024-04-22 MED ORDER — LORATADINE 10 MG PO TABS
10.0000 mg | ORAL_TABLET | Freq: Every day | ORAL | Status: DC
  Administered 2024-04-22 – 2024-04-25 (×4): 10 mg via ORAL
  Filled 2024-04-22 (×4): qty 1

## 2024-04-22 NOTE — Progress Notes (Signed)
 PROGRESS NOTE    Juan [name]  FMW:[id number] DOB: 02/11/1966 DOA: 04/19/2024 PCP: Alvia Bring, DO   Brief Narrative:  This 58 yrs .old Male with medical history significant of hypertension, cirrhosis with portal hypertension, alcohol abuse, C. difficile, GI bleed, PTSD, bipolar disorder, and GERD presents in the ED  with high fever.  Patient was recently hospitalized from 7/12-7/18 with fever noted to have blood cultures positive for streptococcus mitis/oralis with prior cultures in outside facility positive for strep paradanguinis.  Echocardiogram could not rule out aortic valve vegetation.  Repeat blood cultures were negative.  He was treated with 6 weeks of IV Rocephin  through PICC line which were completed 5 days ago.  Also during hospitalization there was concern for the possibility of gallstone pancreatitis/possible cholecystitis but not a surgical candidate given comorbidities.  He makes note that he was referred to [facility] as it was thought that this may have been the initial source for  the bacteremia.  Patient presented with fever cough and stomach pain.  In the ED pertinent labs include hemoglobin 9.4, platelet count 75K lactic acid normal, lipase 139 which is improved.  Chest x-ray shows lower lobe opacities due to atelectasis or mild infiltrate. Patient was admitted for further evaluation and started on empiric antibiotics.  Assessment & Plan:   Principal Problem:   Bacterial endocarditis Active Problems:   SIRS (systemic inflammatory response syndrome) (HCC)   Abnormal chest x-ray   Normocytic anemia   Prolonged QT interval   Cirrhosis (HCC)   Cholelithiasis   Thrombocytopenia (HCC)   History of alcohol abuse   Pneumonia  Suspected pneumonia: Patient presented with fever 101.8 at home with tachypnea.   Patient had just recently been treated with IV antibiotics for 6 weeks for strep species bacteremia thought possibly secondary to endocarditis.  Patient had initially been  treated with empiric antibiotics of vancomycin  and cefepmine. Follow-up blood cultures. CRP, ESR and procalcitonin unremarkable. Obtain repeat echocardiogram. ID consulted recommended to continue ceftriaxone , follow-up 2D echocardiogram.  Patient will likely need TEE when cardiology can do this. Continue vancomycin  for C. difficile prevention.  Abnormal chest x-ray: Chest x-ray showing streaky lower lobe opacities due to atelectasis or mild infiltrate.   COVID, influenza, and RSV screening were negative.   Procalcitonin unremarkable. Check complete respiratory virus panel   Normocytic anemia: Acute on chronic.   Hemoglobin noted to be 9.4, but had previously been 11.2 when checked 2 weeks ago.   Baseline hemoglobin appears to be around 7-9 g/dL. - Recheck H&H   Prolonged QT interval: QTc noted to be prolonged at 503.   - Recheck EKG - Avoid QT prolonging medications   History of liver cirrhosis: Portal hypertension Patient noted to have  lower extremity swelling.   History of decompensated cirrhosis. He had not been taking Lasix  or spironolactone .    Patient notes that spironolactone  had been discontinued due to gynecomastia.  Following with transplant clinic at [facility].  EGD done on 7/15 did not show any esophageal varices, but showed portal hypertensive gastropathy.  - Furosemide  40 mg IV x 1 dose, then resume Lasix  40 mg daily   Cholelithiasis: Patient with prior history of cholelithiasis with concern for cholecystitis versus gallstone pancreatitis during last hospitalization.  Patient was referred to [facility] as this was thought to be a possible source for his bacteremia.  Patient denies any complaints of abdominal pain at this time. Continue outpatient follow-up with GI at [facility]. CT A/P findings suggestive of acute pancreatitis cystic structure in  the pancreatic head.  Cholelithiasis, morphological changes of cirrhosis.   Thrombocytopenia: Chronic.  Platelet count 75 which  appears around patient's baseline.  Thought secondary to history of cirrhosis. - Continue to monitor   History alcohol abuse: Patient currently is not drinking alcohol. - Encouraged continued cessation of alcohol use.    DVT prophylaxis: SCDs Code Status:Full code Family Communication: No family at bed side Disposition Plan:    Status is: Observation The patient remains OBS appropriate and will d/c before 2 midnights.   Admitted for community-acquired pneumonia started on empiric antibiotics.  Consultants:  None  Procedures: Echocardiogram  Antimicrobials:  Anti-infectives (From admission, onward)    Start     Dose/Rate Route Frequency Ordered Stop   04/21/24 1330  vancomycin  (VANCOCIN ) capsule 125 mg        125 mg Oral 2 times daily 04/21/24 1236     04/20/24 1445  cefTRIAXone  (ROCEPHIN ) 2 g in sodium chloride  0.9 % 100 mL IVPB        2 g 200 mL/hr over 30 Minutes Intravenous Every 24 hours 04/20/24 1347     04/20/24 1400  ceFEPIme  (MAXIPIME ) 2 g in sodium chloride  0.9 % 100 mL IVPB  Status:  Discontinued        2 g 200 mL/hr over 30 Minutes Intravenous Every 8 hours 04/20/24 1312 04/20/24 1347   04/20/24 0015  vancomycin  (VANCOCIN ) IVPB 1000 mg/200 mL premix       Placed in Followed by Linked Group   1,000 mg 200 mL/hr over 60 Minutes Intravenous  Once 04/19/24 2236 04/20/24 0130   04/19/24 2300  vancomycin  (VANCOCIN ) IVPB 1000 mg/200 mL premix       Placed in Followed by Linked Group   1,000 mg 200 mL/hr over 60 Minutes Intravenous  Once 04/19/24 2236 04/20/24 0028   04/19/24 2245  ceFEPIme  (MAXIPIME ) 2 g in sodium chloride  0.9 % 100 mL IVPB        2 g 200 mL/hr over 30 Minutes Intravenous  Once 04/19/24 2236 04/19/24 2337       Subjective: Patient was seen and examined at bedside. Overnight events noted. Patient reports feeling improved,  He reports still having mild cough but denies any fever.  Objective: Vitals:   04/22/24 0015 04/22/24 0403 04/22/24  0826 04/22/24 1224  BP: 117/73 129/84 121/74 123/70  Pulse: 83 82 90 89  Resp: 18 15 17 18   Temp: 98.3 F (36.8 C) 98 F (36.7 C) 97.8 F (36.6 C) 98 F (36.7 C)  TempSrc: Oral Oral Oral Oral  SpO2: 100% 98% 98% 99%  Weight:      Height:        Intake/Output Summary (Last 24 hours) at 04/22/2024 1240 Last data filed at 04/22/2024 1223 Gross per 24 hour  Intake 360 ml  Output 2700 ml  Net -2340 ml   Filed Weights   04/19/24 2035 04/20/24 0440  Weight: 87.9 kg 86 kg    Examination:  General exam: Appears calm and comfortable, not in any acute distress. Respiratory system: CTA B/L. Respiratory effort normal. RR 13 Cardiovascular system: S1 & S2 heard, RRR. No JVD, murmurs, rubs, gallops or clicks. Gastrointestinal system: Abdomen is non distended, soft and non tender. Normal bowel sounds heard. Central nervous system: Alert and oriented X 3. No focal neurological deficits. Extremities: No edema, no cyanosis, no clubbing. Skin: No rashes, lesions or ulcers Psychiatry: Judgement and insight appear normal. Mood & affect appropriate.     Data Reviewed:  I have personally reviewed following labs and imaging studies  CBC: Recent Labs  Lab 04/19/24 2100 04/20/24 0820 04/21/24 0435  WBC 6.7  --  6.7  NEUTROABS 3.2  --   --   HGB 9.4* 8.9* 9.8*  HCT 27.9* 26.0* 28.6*  MCV 90.6  --  90.2  PLT 75*  --  71*   Basic Metabolic Panel: Recent Labs  Lab 04/19/24 2100 04/21/24 0435  NA 140 137  K 4.2 3.3*  CL 108 104  CO2 24 24  GLUCOSE 107* 91  BUN 16 11  CREATININE 1.05 0.82  CALCIUM  8.4* 8.7*   GFR: Estimated Creatinine Clearance: 101.4 mL/min (by C-G formula based on SCr of 0.82 mg/dL). Liver Function Tests: Recent Labs  Lab 04/19/24 2100 04/21/24 0435  AST 76* 65*  ALT 26 24  ALKPHOS 90 62  BILITOT 2.4* 5.3*  PROT 5.4* 5.6*  ALBUMIN 2.7* 2.0*   Recent Labs  Lab 04/19/24 2100  LIPASE 139*   No results for input(s): AMMONIA in the last 168  hours. Coagulation Profile: No results for input(s): INR, PROTIME in the last 168 hours. Cardiac Enzymes: No results for input(s): CKTOTAL, CKMB, CKMBINDEX, TROPONINI in the last 168 hours. BNP (last 3 results) Recent Labs    02/20/24 2159 04/19/24 2100  PROBNP 229.0 106.0   HbA1C: No results for input(s): HGBA1C in the last 72 hours. CBG: No results for input(s): GLUCAP in the last 168 hours. Lipid Profile: No results for input(s): CHOL, HDL, LDLCALC, TRIG, CHOLHDL, LDLDIRECT in the last 72 hours. Thyroid  Function Tests: No results for input(s): TSH, T4TOTAL, FREET4, T3FREE, THYROIDAB in the last 72 hours. Anemia Panel: No results for input(s): VITAMINB12, FOLATE, FERRITIN, TIBC, IRON, RETICCTPCT in the last 72 hours. Sepsis Labs: Recent Labs  Lab 04/19/24 2100 04/20/24 0820  PROCALCITON  --  <0.10  LATICACIDVEN 1.6  --     Recent Results (from the past 240 hours)  Resp panel by RT-PCR (RSV, Flu A&B, Covid) Anterior Nasal Swab     Status: None   Collection Time: 04/19/24  9:51 PM   Specimen: Anterior Nasal Swab  Result Value Ref Range Status   SARS Coronavirus 2 by RT PCR NEGATIVE NEGATIVE Final    Comment: (NOTE) SARS-CoV-2 target nucleic acids are NOT DETECTED.  The SARS-CoV-2 RNA is generally detectable in upper respiratory specimens during the acute phase of infection. The lowest concentration of SARS-CoV-2 viral copies this assay can detect is 138 copies/mL. A negative result does not preclude SARS-Cov-2 infection and should not be used as the sole basis for treatment or other patient management decisions. A negative result may occur with  improper specimen collection/handling, submission of specimen other than nasopharyngeal swab, presence of viral mutation(s) within the areas targeted by this assay, and inadequate number of viral copies(<138 copies/mL). A negative result must be combined with clinical  observations, patient history, and epidemiological information. The expected result is Negative.  Fact Sheet for Patients:  BloggerCourse.com  Fact Sheet for Healthcare Providers:  SeriousBroker.it  This test is no t yet approved or cleared by the [place]  FDA and  has been authorized for detection and/or diagnosis of SARS-CoV-2 by FDA under an Emergency Use Authorization (EUA). This EUA will remain  in effect (meaning this test can be used) for the duration of the COVID-19 declaration under Section 564(b)(1) of the Act, 21 U.S.C.section 360bbb-3(b)(1), unless the authorization is terminated  or revoked sooner.       Influenza A  by PCR NEGATIVE NEGATIVE Final   Influenza B by PCR NEGATIVE NEGATIVE Final    Comment: (NOTE) The Xpert Xpress SARS-CoV-2/FLU/RSV plus assay is intended as an aid in the diagnosis of influenza from Nasopharyngeal swab specimens and should not be used as a sole basis for treatment. Nasal washings and aspirates are unacceptable for Xpert Xpress SARS-CoV-2/FLU/RSV testing.  Fact Sheet for Patients: BloggerCourse.com  Fact Sheet for Healthcare Providers: SeriousBroker.it  This test is not yet approved or cleared by the [place]  FDA and has been authorized for detection and/or diagnosis of SARS-CoV-2 by FDA under an Emergency Use Authorization (EUA). This EUA will remain in effect (meaning this test can be used) for the duration of the COVID-19 declaration under Section 564(b)(1) of the Act, 21 U.S.C. section 360bbb-3(b)(1), unless the authorization is terminated or revoked.     Resp Syncytial Virus by PCR NEGATIVE NEGATIVE Final    Comment: (NOTE) Fact Sheet for Patients: BloggerCourse.com  Fact Sheet for Healthcare Providers: SeriousBroker.it  This test is not yet approved or cleared by  the [place]  FDA and has been authorized for detection and/or diagnosis of SARS-CoV-2 by FDA under an Emergency Use Authorization (EUA). This EUA will remain in effect (meaning this test can be used) for the duration of the COVID-19 declaration under Section 564(b)(1) of the Act, 21 U.S.C. section 360bbb-3(b)(1), unless the authorization is terminated or revoked.  Performed at [facility], [address] [place], [place] [address]   Blood culture (routine x 2)     Status: None (Preliminary result)   Collection Time: 04/19/24 10:04 PM   Specimen: BLOOD  Result Value Ref Range Status   Specimen Description   Final    BLOOD LEFT ANTECUBITAL Performed at [facility], [address] [place], [place] [address]    Special Requests   Final    BOTTLES DRAWN AEROBIC AND ANAEROBIC Blood Culture adequate volume Performed at [facility], [address] [place], [place] [address]    Culture   Final    NO GROWTH 2 DAYS Performed at [facility], [address], [place], [place] [address]    Report Status PENDING  Incomplete  Blood culture (routine x 2)     Status: None (Preliminary result)   Collection Time: 04/19/24 10:30 PM   Specimen: BLOOD LEFT HAND  Result Value Ref Range Status   Specimen Description   Final    BLOOD LEFT HAND Performed at [facility], [address] [place], [place] [address]    Special Requests   Final    BOTTLES DRAWN AEROBIC AND ANAEROBIC Blood Culture results may not be optimal due to an inadequate volume of blood received in culture bottles Performed at [facility], [address] [place], [place] [address]    Culture   Final    NO GROWTH 2 DAYS Performed at [facility], [address] [place], [place] [address]    Report Status PENDING  Incomplete  Respiratory (~20 pathogens) panel by PCR     Status: None   Collection Time: 04/20/24  8:30 AM   Specimen: Nasopharyngeal  Swab; Respiratory  Result Value Ref Range Status   Adenovirus NOT DETECTED NOT DETECTED Final   Coronavirus 229E NOT DETECTED NOT DETECTED Final    Comment: (NOTE) The Coronavirus on the Respiratory Panel, DOES NOT test for the novel  Coronavirus (2019 nCoV)  Coronavirus HKU1 NOT DETECTED NOT DETECTED Final   Coronavirus NL63 NOT DETECTED NOT DETECTED Final   Coronavirus OC43 NOT DETECTED NOT DETECTED Final   Metapneumovirus NOT DETECTED NOT DETECTED Final   Rhinovirus / Enterovirus NOT DETECTED NOT DETECTED Final   Influenza A NOT DETECTED NOT DETECTED Final   Influenza B NOT DETECTED NOT DETECTED Final   Parainfluenza Virus 1 NOT DETECTED NOT DETECTED Final   Parainfluenza Virus 2 NOT DETECTED NOT DETECTED Final   Parainfluenza Virus 3 NOT DETECTED NOT DETECTED Final   Parainfluenza Virus 4 NOT DETECTED NOT DETECTED Final   Respiratory Syncytial Virus NOT DETECTED NOT DETECTED Final   Bordetella pertussis NOT DETECTED NOT DETECTED Final   Bordetella Parapertussis NOT DETECTED NOT DETECTED Final   Chlamydophila pneumoniae NOT DETECTED NOT DETECTED Final   Mycoplasma pneumoniae NOT DETECTED NOT DETECTED Final    Comment: Performed at [facility], [address] [place], [place] [address]    Radiology Studies: CT ABDOMEN PELVIS W CONTRAST Result Date: 04/21/2024 CLINICAL DATA:  Cholecystitis. EXAM: CT ABDOMEN AND PELVIS WITH CONTRAST TECHNIQUE: Multidetector CT imaging of the abdomen and pelvis was performed using the standard protocol following bolus administration of intravenous contrast. RADIATION DOSE REDUCTION: This exam was performed according to the departmental dose-optimization program which includes automated exposure control, adjustment of the mA and/or kV according to patient size and/or use of iterative reconstruction technique. CONTRAST:  75mL OMNIPAQUE  IOHEXOL  350 MG/ML SOLN COMPARISON:  03/03/2024. FINDINGS: Lower chest: Scattered coronary artery calcifications  are noted. No acute abnormality. Hepatobiliary: The liver has a nodular contour, compatible with underlying cirrhosis. A 7 mm hypodensity is present in the posterior right lobe of the liver, unchanged from the prior exam. Stones are noted within the gallbladder. No biliary ductal dilatation is seen. Pancreas: A cystic region is present in the pancreas, the largest measuring 11 mm in the pancreatic head, previously characterized by MRI. No pancreatic ductal dilatation is seen. There is mild peripancreatic fat stranding and edema. Spleen: Normal in size without focal abnormality. Adrenals/Urinary Tract: The adrenal glands are within normal limits. The kidneys enhance symmetrically. A small calculus is noted in the lower pole of the left kidney. No hydronephrosis bilaterally. The bladder is unremarkable. Stomach/Bowel: There is a small hiatal hernia. The stomach is otherwise within normal limits. No bowel obstruction, free air, or pneumatosis is seen. Appendix appears normal. A moderate amount of retained stools present in the colon. Vascular/Lymphatic: Aortic atherosclerosis. 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 Nonspecific prominent lymph nodes are present in the gastrohepatic ligament and porta hepatis, likely reactive. Reproductive: Prostate is unremarkable. Other: No ascites.  A fat containing umbilical hernia is noted. Musculoskeletal: No acute osseous abnormality. IMPRESSION: 1. Findings suggestive of acute pancreatitis. 2. 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 3. Morphologic changes of cirrhosis and portal hypertension. 4. Cholelithiasis. 5. Small hiatal hernia. 6. Nonobstructive left renal calculus. 7. Aortic atherosclerosis. Electronically Signed   By: Leita Birmingham M.D.   On: 04/21/2024 18:37   ECHOCARDIOGRAM LIMITED Result Date: 04/21/2024    ECHOCARDIOGRAM LIMITED REPORT   Patient Name:    KHYAN OATS Date of Exam: 04/21/2024 Medical Rec #:  [id number]       Height:       65.0 in Accession #:    [id number]      Weight:       189.5 lb Date of Birth:  May 07, 1966       BSA:          1.933 m Patient Age:    58 years        BP:           123/77 mmHg Patient Gender: M               HR:           76 bpm. Exam Location:  Inpatient Procedure: Limited Echo, Color Doppler and Cardiac Doppler (Both Spectral and            Color Flow Doppler were utilized during procedure). Indications:    Endocarditis i38  History:        Patient has prior history of Echocardiogram examinations, most                 recent 03/12/2024. Risk Factors:Hypertension and ETOH.  Sonographer:    Damien Senior RDCS Referring Phys: [id number] RONDELL A SMITH  Sonographer Comments: Limited post 6 weeks antibiotics IMPRESSIONS  1. Left ventricular ejection fraction, by estimation, is 70 to 75%. The left ventricle has hyperdynamic function.  2. Right ventricular systolic function is normal.  3. The mitral valve is grossly normal. Trivial mitral valve regurgitation.  4. Aortic valve gradients have increased from a mean of to . This is partially related to dynamic function, though DVI is similar. Highest gradient was in the three chamber view and is best optimized in this study. The aortic valve is abnormal. There is severe calcifcation of the aortic valve. There is severe thickening of the aortic valve. Aortic valve regurgitation is trivial. Moderate aortic valve stenosis. Aortic valve area, by VTI measures 1.55 cm. Aortic valve mean gradient measures 28.0 mmHg. Aortic valve Vmax measures 3.40 m/s. Comparison(s): Prior images reviewed side by side. DVI is a sub-optimal comparator of AS signal in both studies as LVOT VTI aliasing occurs in both. Overall, slightly higher signal of aortic valve with calcified aortic valve and increased function. There  is no clear evidence of a vegetation on this study. Conclusion(s)/Recommendation(s):  No evidence of valvular vegetations on this transthoracic echocardiogram. Consider a transesophageal echocardiogram to exclude infective endocarditis if clinically indicated. FINDINGS  Left Ventricle: Left ventricular ejection fraction, by estimation, is 70 to 75%. The left ventricle has hyperdynamic function. Right Ventricle: Right ventricular systolic function is normal. Mitral Valve: The mitral valve is grossly normal. Mild mitral annular calcification. Trivial mitral valve regurgitation. Tricuspid Valve: The tricuspid valve is normal in structure. Tricuspid valve regurgitation is trivial. Aortic Valve: Aortic valve gradients have increased from a mean of to . This is partially related to dynamic function, though DVI is similar. Highest gradient was in the three chamber view and is best optimized in this study. The aortic valve is abnormal. There is severe calcifcation of the aortic valve. There is severe thickening of the aortic valve. Aortic valve regurgitation is trivial. Moderate aortic stenosis is present. Aortic valve mean gradient measures 28.0 mmHg. Aortic valve peak gradient measures 46.2 mmHg. Aortic valve area, by VTI measures 1.55 cm. Pulmonic Valve: The pulmonic valve was thickened with good excursion. Pulmonic valve regurgitation is trivial. No evidence of pulmonic stenosis. Additional Comments: Spectral Doppler performed. Color Doppler performed.  LEFT VENTRICLE PLAX 2D LVOT diam:     2.00 cm LV SV:         92 LV SV Index:   48 LVOT Area:     3.14 cm  AORTIC VALVE AV  Area (Vmax):    1.41 cm AV Area (Vmean):   1.52 cm AV Area (VTI):     1.55 cm AV Vmax:           340.00 cm/s AV Vmean:          246.000 cm/s AV VTI:            0.594 m AV Peak Grad:      46.2 mmHg AV Mean Grad:      28.0 mmHg LVOT Vmax:         153.00 cm/s LVOT Vmean:        119.000 cm/s LVOT VTI:          0.293 m LVOT/AV VTI ratio: 0.49  SHUNTS Systemic VTI:  0.29 m Systemic Diam: 2.00 cm Stanly Leavens MD  Electronically signed by Stanly Leavens MD Signature Date/Time: 04/21/2024/1:54:09 PM    Final    Scheduled Meds:  furosemide   40 mg Oral Daily   loratadine   10 mg Oral Daily   saccharomyces boulardii  250 mg Oral BID   sodium chloride  flush  3 mL Intravenous Q12H   vancomycin   125 mg Oral BID   Continuous Infusions:  cefTRIAXone  (ROCEPHIN )  IV 2 g (04/21/24 1504)     LOS: 1 day    Time spent: 35 Mins    Darcel Dawley, MD [facility]   If 7PM-7AM, please contact night-coverage

## 2024-04-22 NOTE — Progress Notes (Signed)
 Subjective: He is having loose bowel movements now.   Antibiotics:  Anti-infectives (From admission, onward)    Start     Dose/Rate Route Frequency Ordered Stop   04/21/24 1330  vancomycin  (VANCOCIN ) capsule 125 mg        125 mg Oral 2 times daily 04/21/24 1236     04/20/24 1445  cefTRIAXone  (ROCEPHIN ) 2 g in sodium chloride  0.9 % 100 mL IVPB        2 g 200 mL/hr over 30 Minutes Intravenous Every 24 hours 04/20/24 1347     04/20/24 1400  ceFEPIme  (MAXIPIME ) 2 g in sodium chloride  0.9 % 100 mL IVPB  Status:  Discontinued        2 g 200 mL/hr over 30 Minutes Intravenous Every 8 hours 04/20/24 1312 04/20/24 1347   04/20/24 0015  vancomycin  (VANCOCIN ) IVPB 1000 mg/200 mL premix       Placed in Followed by Linked Group   1,000 mg 200 mL/hr over 60 Minutes Intravenous  Once 04/19/24 2236 04/20/24 0130   04/19/24 2300  vancomycin  (VANCOCIN ) IVPB 1000 mg/200 mL premix       Placed in Followed by Linked Group   1,000 mg 200 mL/hr over 60 Minutes Intravenous  Once 04/19/24 2236 04/20/24 0028   04/19/24 2245  ceFEPIme  (MAXIPIME ) 2 g in sodium chloride  0.9 % 100 mL IVPB        2 g 200 mL/hr over 30 Minutes Intravenous  Once 04/19/24 2236 04/19/24 2337       Medications: Scheduled Meds:  furosemide   40 mg Oral Daily   loratadine   10 mg Oral Daily   saccharomyces boulardii  250 mg Oral BID   sodium chloride  flush  3 mL Intravenous Q12H   vancomycin   125 mg Oral BID   Continuous Infusions:  cefTRIAXone  (ROCEPHIN )  IV 2 g (04/21/24 1504)   PRN Meds:.alum & mag hydroxide-simeth, hydrocortisone  cream, loperamide , mouth rinse, traMADol , trimethobenzamide     Objective: Weight change:   Intake/Output Summary (Last 24 hours) at 04/22/2024 1258 Last data filed at 04/22/2024 1223 Gross per 24 hour  Intake 360 ml  Output 2700 ml  Net -2340 ml   Blood pressure 123/70, pulse 89, temperature 98 F (36.7 C), temperature source Oral, resp. rate 18, height 5' 5 (1.651 m),  weight 86 kg, SpO2 99%. Temp:  [97.8 F (36.6 C)-98.6 F (37 C)] 98 F (36.7 C) (08/31 1224) Pulse Rate:  [79-90] 89 (08/31 1224) Resp:  [15-20] 18 (08/31 1224) BP: (117-129)/(67-84) 123/70 (08/31 1224) SpO2:  [95 %-100 %] 99 % (08/31 1224)  Physical Exam: Physical Exam Constitutional:      Appearance: He is well-developed.  HENT:     Head: Normocephalic and atraumatic.  Eyes:     Conjunctiva/sclera: Conjunctivae normal.  Cardiovascular:     Rate and Rhythm: Normal rate and regular rhythm.  Pulmonary:     Effort: Pulmonary effort is normal. No respiratory distress.     Breath sounds: No wheezing.  Abdominal:     General: There is no distension.     Palpations: Abdomen is soft.  Musculoskeletal:        General: Normal range of motion.     Cervical back: Normal range of motion and neck supple.  Skin:    General: Skin is warm and dry.     Findings: No erythema or rash.  Neurological:     General: No focal deficit present.  Mental Status: He is alert and oriented to person, place, and time.  Psychiatric:        Mood and Affect: Mood normal.        Behavior: Behavior normal.        Thought Content: Thought content normal.        Judgment: Judgment normal.      CBC:    BMET Recent Labs    04/19/24 2100 04/21/24 0435  NA 140 137  K 4.2 3.3*  CL 108 104  CO2 24 24  GLUCOSE 107* 91  BUN 16 11  CREATININE 1.05 0.82  CALCIUM  8.4* 8.7*     Liver Panel  Recent Labs    04/19/24 2100 04/21/24 0435  PROT 5.4* 5.6*  ALBUMIN 2.7* 2.0*  AST 76* 65*  ALT 26 24  ALKPHOS 90 62  BILITOT 2.4* 5.3*       Sedimentation Rate Recent Labs    04/20/24 0820  ESRSEDRATE 10   C-Reactive Protein Recent Labs    04/20/24 0820  CRP 1.1*    Micro Results: Recent Results (from the past 720 hours)  Resp panel by RT-PCR (RSV, Flu A&B, Covid) Anterior Nasal Swab     Status: None   Collection Time: 04/19/24  9:51 PM   Specimen: Anterior Nasal Swab  Result  Value Ref Range Status   SARS Coronavirus 2 by RT PCR NEGATIVE NEGATIVE Final    Comment: (NOTE) SARS-CoV-2 target nucleic acids are NOT DETECTED.  The SARS-CoV-2 RNA is generally detectable in upper respiratory specimens during the acute phase of infection. The lowest concentration of SARS-CoV-2 viral copies this assay can detect is 138 copies/mL. A negative result does not preclude SARS-Cov-2 infection and should not be used as the sole basis for treatment or other patient management decisions. A negative result may occur with  improper specimen collection/handling, submission of specimen other than nasopharyngeal swab, presence of viral mutation(s) within the areas targeted by this assay, and inadequate number of viral copies(<138 copies/mL). A negative result must be combined with clinical observations, patient history, and epidemiological information. The expected result is Negative.  Fact Sheet for Patients:  BloggerCourse.com  Fact Sheet for Healthcare Providers:  SeriousBroker.it  This test is no t yet approved or cleared by the [place]  FDA and  has been authorized for detection and/or diagnosis of SARS-CoV-2 by FDA under an Emergency Use Authorization (EUA). This EUA will remain  in effect (meaning this test can be used) for the duration of the COVID-19 declaration under Section 564(b)(1) of the Act, 21 U.S.C.section 360bbb-3(b)(1), unless the authorization is terminated  or revoked sooner.       Influenza A by PCR NEGATIVE NEGATIVE Final   Influenza B by PCR NEGATIVE NEGATIVE Final    Comment: (NOTE) The Xpert Xpress SARS-CoV-2/FLU/RSV plus assay is intended as an aid in the diagnosis of influenza from Nasopharyngeal swab specimens and should not be used as a sole basis for treatment. Nasal washings and aspirates are unacceptable for Xpert Xpress SARS-CoV-2/FLU/RSV testing.  Fact Sheet for  Patients: BloggerCourse.com  Fact Sheet for Healthcare Providers: SeriousBroker.it  This test is not yet approved or cleared by the [place]  FDA and has been authorized for detection and/or diagnosis of SARS-CoV-2 by FDA under an Emergency Use Authorization (EUA). This EUA will remain in effect (meaning this test can be used) for the duration of the COVID-19 declaration under Section 564(b)(1) of the Act, 21 U.S.C. section 360bbb-3(b)(1), unless the authorization is terminated  or revoked.     Resp Syncytial Virus by PCR NEGATIVE NEGATIVE Final    Comment: (NOTE) Fact Sheet for Patients: BloggerCourse.com  Fact Sheet for Healthcare Providers: SeriousBroker.it  This test is not yet approved or cleared by the [place]  FDA and has been authorized for detection and/or diagnosis of SARS-CoV-2 by FDA under an Emergency Use Authorization (EUA). This EUA will remain in effect (meaning this test can be used) for the duration of the COVID-19 declaration under Section 564(b)(1) of the Act, 21 U.S.C. section 360bbb-3(b)(1), unless the authorization is terminated or revoked.  Performed at [facility], [address] [place], [place] [address]   Blood culture (routine x 2)     Status: None (Preliminary result)   Collection Time: 04/19/24 10:04 PM   Specimen: BLOOD  Result Value Ref Range Status   Specimen Description   Final    BLOOD LEFT ANTECUBITAL Performed at [facility], [address] [place], [place] [address]    Special Requests   Final    BOTTLES DRAWN AEROBIC AND ANAEROBIC Blood Culture adequate volume Performed at [facility], [address] [place], [place] [address]    Culture   Final    NO GROWTH 2 DAYS Performed at [facility], [address] [place], [place] [address]    Report Status PENDING  Incomplete  Blood  culture (routine x 2)     Status: None (Preliminary result)   Collection Time: 04/19/24 10:30 PM   Specimen: BLOOD LEFT HAND  Result Value Ref Range Status   Specimen Description   Final    BLOOD LEFT HAND Performed at [facility], [address] [place], [place] [address]    Special Requests   Final    BOTTLES DRAWN AEROBIC AND ANAEROBIC Blood Culture results may not be optimal due to an inadequate volume of blood received in culture bottles Performed at [facility], [address] [place], [place] [address]    Culture   Final    NO GROWTH 2 DAYS Performed at [facility], [address], [place], [place] [address]    Report Status PENDING  Incomplete  Respiratory (~20 pathogens) panel by PCR     Status: None   Collection Time: 04/20/24  8:30 AM   Specimen: Nasopharyngeal Swab; Respiratory  Result Value Ref Range Status   Adenovirus NOT DETECTED NOT DETECTED Final   Coronavirus 229E NOT DETECTED NOT DETECTED Final    Comment: (NOTE) The Coronavirus on the Respiratory Panel, DOES NOT test for the novel  Coronavirus (2019 nCoV)    Coronavirus HKU1 NOT DETECTED NOT DETECTED Final   Coronavirus NL63 NOT DETECTED NOT DETECTED Final   Coronavirus OC43 NOT DETECTED NOT DETECTED Final   Metapneumovirus NOT DETECTED NOT DETECTED Final   Rhinovirus / Enterovirus NOT DETECTED NOT DETECTED Final   Influenza A NOT DETECTED NOT DETECTED Final   Influenza B NOT DETECTED NOT DETECTED Final   Parainfluenza Virus 1 NOT DETECTED NOT DETECTED Final   Parainfluenza Virus 2 NOT DETECTED NOT DETECTED Final   Parainfluenza Virus 3 NOT DETECTED NOT DETECTED Final   Parainfluenza Virus 4 NOT DETECTED NOT DETECTED Final   Respiratory Syncytial Virus NOT DETECTED NOT DETECTED Final   Bordetella pertussis NOT DETECTED NOT DETECTED Final   Bordetella Parapertussis NOT DETECTED NOT DETECTED Final   Chlamydophila pneumoniae NOT DETECTED NOT DETECTED Final  Mycoplasma  pneumoniae NOT DETECTED NOT DETECTED Final    Comment: Performed at [facility], [address], [place], [place] [address]    Studies/Results: CT ABDOMEN PELVIS W CONTRAST Result Date: 04/21/2024 CLINICAL DATA:  Cholecystitis. EXAM: CT ABDOMEN AND PELVIS WITH CONTRAST TECHNIQUE: Multidetector CT imaging of the abdomen and pelvis was performed using the standard protocol following bolus administration of intravenous contrast. RADIATION DOSE REDUCTION: This exam was performed according to the departmental dose-optimization program which includes automated exposure control, adjustment of the mA and/or kV according to patient size and/or use of iterative reconstruction technique. CONTRAST:  75mL OMNIPAQUE  IOHEXOL  350 MG/ML SOLN COMPARISON:  03/03/2024. FINDINGS: Lower chest: Scattered coronary artery calcifications are noted. No acute abnormality. Hepatobiliary: The liver has a nodular contour, compatible with underlying cirrhosis. A 7 mm hypodensity is present in the posterior right lobe of the liver, unchanged from the prior exam. Stones are noted within the gallbladder. No biliary ductal dilatation is seen. Pancreas: A cystic region is present in the pancreas, the largest measuring 11 mm in the pancreatic head, previously characterized by MRI. No pancreatic ductal dilatation is seen. There is mild peripancreatic fat stranding and edema. Spleen: Normal in size without focal abnormality. Adrenals/Urinary Tract: The adrenal glands are within normal limits. The kidneys enhance symmetrically. A small calculus is noted in the lower pole of the left kidney. No hydronephrosis bilaterally. The bladder is unremarkable. Stomach/Bowel: There is a small hiatal hernia. The stomach is otherwise within normal limits. No bowel obstruction, free air, or pneumatosis is seen. Appendix appears normal. A moderate amount of retained stools present in the colon. Vascular/Lymphatic: Aortic atherosclerosis. 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 Nonspecific prominent lymph nodes are present in the gastrohepatic ligament and porta hepatis, likely reactive. Reproductive: Prostate is unremarkable. Other: No ascites.  A fat containing umbilical hernia is noted. Musculoskeletal: No acute osseous abnormality. IMPRESSION: 1. Findings suggestive of acute pancreatitis. 2. 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 3. Morphologic changes of cirrhosis and portal hypertension. 4. Cholelithiasis. 5. Small hiatal hernia. 6. Nonobstructive left renal calculus. 7. Aortic atherosclerosis. Electronically Signed   By: Leita Birmingham M.D.   On: 04/21/2024 18:37   ECHOCARDIOGRAM LIMITED Result Date: 04/21/2024    ECHOCARDIOGRAM LIMITED REPORT   Patient Name:   Juan [name] Date of Exam: 04/21/2024 Medical Rec #:  [id number]       Height:       65.0 in Accession #:    [id number]      Weight:       189.5 lb Date of Birth:  1965/09/28       BSA:          1.933 m Patient Age:    58 years        BP:           123/77 mmHg Patient Gender: M               HR:           76 bpm. Exam Location:  Inpatient Procedure: Limited Echo, Color Doppler and Cardiac Doppler (Both Spectral and            Color Flow Doppler were utilized during procedure). Indications:    Endocarditis i38  History:        Patient has prior history of Echocardiogram examinations, most  recent 03/12/2024. Risk Factors:Hypertension and ETOH.  Sonographer:    Damien Senior RDCS Referring Phys: [id number] RONDELL A SMITH  Sonographer Comments: Limited post 6 weeks antibiotics IMPRESSIONS  1. Left ventricular ejection fraction, by estimation, is 70 to 75%. The left ventricle has hyperdynamic function.  2. Right ventricular systolic function is normal.  3. The mitral valve is grossly normal. Trivial mitral valve regurgitation.  4. Aortic valve gradients have increased from  a mean of to . This is partially related to dynamic function, though DVI is similar. Highest gradient was in the three chamber view and is best optimized in this study. The aortic valve is abnormal. There is severe calcifcation of the aortic valve. There is severe thickening of the aortic valve. Aortic valve regurgitation is trivial. Moderate aortic valve stenosis. Aortic valve area, by VTI measures 1.55 cm. Aortic valve mean gradient measures 28.0 mmHg. Aortic valve Vmax measures 3.40 m/s. Comparison(s): Prior images reviewed side by side. DVI is a sub-optimal comparator of AS signal in both studies as LVOT VTI aliasing occurs in both. Overall, slightly higher signal of aortic valve with calcified aortic valve and increased function. There  is no clear evidence of a vegetation on this study. Conclusion(s)/Recommendation(s): No evidence of valvular vegetations on this transthoracic echocardiogram. Consider a transesophageal echocardiogram to exclude infective endocarditis if clinically indicated. FINDINGS  Left Ventricle: Left ventricular ejection fraction, by estimation, is 70 to 75%. The left ventricle has hyperdynamic function. Right Ventricle: Right ventricular systolic function is normal. Mitral Valve: The mitral valve is grossly normal. Mild mitral annular calcification. Trivial mitral valve regurgitation. Tricuspid Valve: The tricuspid valve is normal in structure. Tricuspid valve regurgitation is trivial. Aortic Valve: Aortic valve gradients have increased from a mean of to . This is partially related to dynamic function, though DVI is similar. Highest gradient was in the three chamber view and is best optimized in this study. The aortic valve is abnormal. There is severe calcifcation of the aortic valve. There is severe thickening of the aortic valve. Aortic valve regurgitation is trivial. Moderate aortic stenosis is present. Aortic valve mean gradient measures 28.0 mmHg. Aortic  valve peak gradient measures 46.2 mmHg. Aortic valve area, by VTI measures 1.55 cm. Pulmonic Valve: The pulmonic valve was thickened with good excursion. Pulmonic valve regurgitation is trivial. No evidence of pulmonic stenosis. Additional Comments: Spectral Doppler performed. Color Doppler performed.  LEFT VENTRICLE PLAX 2D LVOT diam:     2.00 cm LV SV:         92 LV SV Index:   48 LVOT Area:     3.14 cm  AORTIC VALVE AV Area (Vmax):    1.41 cm AV Area (Vmean):   1.52 cm AV Area (VTI):     1.55 cm AV Vmax:           340.00 cm/s AV Vmean:          246.000 cm/s AV VTI:            0.594 m AV Peak Grad:      46.2 mmHg AV Mean Grad:      28.0 mmHg LVOT Vmax:         153.00 cm/s LVOT Vmean:        119.000 cm/s LVOT VTI:          0.293 m LVOT/AV VTI ratio: 0.49  SHUNTS Systemic VTI:  0.29 m Systemic Diam: 2.00 cm Stanly Leavens MD Electronically signed by Stanly Leavens MD Signature Date/Time:  04/21/2024/1:54:09 PM    Final       Assessment/Plan:  INTERVAL HISTORY: TTE and CT peformed   Principal Problem:   Bacterial endocarditis Active Problems:   History of alcohol abuse   Thrombocytopenia (HCC)   Normocytic anemia   Prolonged QT interval   Cirrhosis (HCC)   Pneumonia   SIRS (systemic inflammatory response syndrome) (HCC)   Abnormal chest x-ray   Cholelithiasis    Juan [name] is a 58 y.o. male with complicated past medical history including alcoholic cirrhosis gallstone pancreatitis who had been admitted to [facility] as a transfer patient to outside facility Streptococcus para sanguinous is bacteremia and found to have endocarditis on TEE.  2 days after completing his IV antibiotics he had recurrence of fevers chills and rigors.  He came back to the emergency department blood cultures were taken and he was placed on antibiotics, currently on ceftriaxone .  There is concern obviously for endocarditis or for intra-abdominal infection though clinically he does not show much to  suggest intra-abdominal pathology  #1 Rule out recurrent endocarditis  2D echocardiogram shows some calcification of the aortic valve but does not show a clear-cut vegetation.  I think clearly he needs another TEE when cardiology are back and able to do this on Tuesday or later in the week whenever it is possible  Would continue ceftriaxone   Would follow-up blood cultures  #2 CT read is showing pancreatitis though he has no symptoms of pancreatitis he does not have evidence of cholecystitis on imaging.  #3 Hx of C difficile: continue prophylactic vancomycin   #4 Diarrhea: likely from his ceftriaxone . Caution with Imodium  Lomotil. Certainly if he develops C diff, they must be stopped  I have personally spent 50 minutes involved in face-to-face and non-face-to-face activities for this patient on the day of the visit. Professional time spent includes the following activities: Preparing to see the patient (review of tests), Obtaining and/or reviewing separately obtained history (admission/discharge record), Performing a medically appropriate examination and/or evaluation , Ordering medications/tests/procedures, referring and communicating with other health care professionals, Documenting clinical information in the EMR, Independently interpreting results (not separately reported), Communicating results to the patient/family/caregiver, Counseling and educating the patient/family/caregiver and Care coordination (not separately reported).   Evaluation of the patient requires complex antimicrobial therapy evaluation, counseling , isolation needs to reduce disease transmission and risk assessment and mitigation.      LOS: 1 day   Jomarie Fleeta Rothman 04/22/2024, 12:58 PM

## 2024-04-22 NOTE — Progress Notes (Signed)
 Patient reports itching to lower extremities and chest.  No rash noted to lower extremities.  Patient noted to take hydroxyzine  prn at home and clotrimazole  cream as well for itching.  Secure chat sent and amion page x 2 to Dr. Alfornia.  Awaiting further orders.

## 2024-04-22 NOTE — Progress Notes (Signed)
 Mobility Specialist Progress Note;   04/22/24 1209  Mobility  Activity Ambulated with assistance  Level of Assistance Standby assist, set-up cues, supervision of patient - no hands on  Assistive Device Front wheel walker  Distance Ambulated (ft) 350 ft  Activity Response Tolerated well  Mobility Referral Yes  Mobility visit 1 Mobility  Mobility Specialist Start Time (ACUTE ONLY) 1209  Mobility Specialist Stop Time (ACUTE ONLY) 1216  Mobility Specialist Time Calculation (min) (ACUTE ONLY) 7 min   Pt agreeable to mobility. Required no physical assistance during ambulation, SV for safety. VSS throughout. C/o L hip stiffness, otherwise asx. Pt returned to bed and left with all needs met, call bell in reach.  Lauraine Erm Mobility Specialist Please contact via SecureChat or [employer] [phone]

## 2024-04-23 LAB — CBC
HCT: 29.9 % — ABNORMAL LOW (ref 39.0–52.0)
Hemoglobin: 9.9 g/dL — ABNORMAL LOW (ref 13.0–17.0)
MCH: 30.6 pg (ref 26.0–34.0)
MCHC: 33.1 g/dL (ref 30.0–36.0)
MCV: 92.3 fL (ref 80.0–100.0)
Platelets: 82 K/uL — ABNORMAL LOW (ref 150–400)
RBC: 3.24 MIL/uL — ABNORMAL LOW (ref 4.22–5.81)
RDW: 20 % — ABNORMAL HIGH (ref 11.5–15.5)
WBC: 7.2 K/uL (ref 4.0–10.5)
nRBC: 0 % (ref 0.0–0.2)

## 2024-04-23 LAB — PHOSPHORUS: Phosphorus: 4.6 mg/dL (ref 2.5–4.6)

## 2024-04-23 LAB — BASIC METABOLIC PANEL WITH GFR
Anion gap: 8 (ref 5–15)
BUN: 8 mg/dL (ref 6–20)
CO2: 25 mmol/L (ref 22–32)
Calcium: 8.8 mg/dL — ABNORMAL LOW (ref 8.9–10.3)
Chloride: 103 mmol/L (ref 98–111)
Creatinine, Ser: 0.91 mg/dL (ref 0.61–1.24)
GFR, Estimated: >60 mL/min (ref >60)
Glucose, Bld: 93 mg/dL (ref 70–99)
Potassium: 3.3 mmol/L — ABNORMAL LOW (ref 3.5–5.1)
Sodium: 136 mmol/L (ref 135–145)

## 2024-04-23 LAB — MAGNESIUM: Magnesium: 1.2 mg/dL — ABNORMAL LOW (ref 1.7–2.4)

## 2024-04-23 MED ORDER — POTASSIUM CHLORIDE CRYS ER 20 MEQ PO TBCR
40.0000 meq | EXTENDED_RELEASE_TABLET | Freq: Once | ORAL | Status: AC
  Administered 2024-04-23: 40 meq via ORAL
  Filled 2024-04-23: qty 2

## 2024-04-23 MED ORDER — MAGNESIUM SULFATE 4 GM/100ML IV SOLN
4.0000 g | Freq: Once | INTRAVENOUS | Status: AC
  Administered 2024-04-23: 4 g via INTRAVENOUS
  Filled 2024-04-23: qty 100

## 2024-04-23 NOTE — Progress Notes (Signed)
 Mobility Specialist Progress Note;   04/23/24 0922  Mobility  Activity Ambulated with assistance  Level of Assistance Standby assist, set-up cues, supervision of patient - no hands on  Assistive Device Front wheel walker  Distance Ambulated (ft) 350 ft  Activity Response Tolerated well  Mobility Referral Yes  Mobility visit 1 Mobility  Mobility Specialist Start Time (ACUTE ONLY) [id number]  Mobility Specialist Stop Time (ACUTE ONLY) [id number]  Mobility Specialist Time Calculation (min) (ACUTE ONLY) 16 min   Pt eager for mobility. Required no physical assistance during ambulation, SV. VSS throughout. No c/o other than some L foot pain from past ankle injury. Pt returned back to bed and left with all needs met. IV team present.   Lauraine Erm Mobility Specialist Please contact via SecureChat or [employer] [phone]

## 2024-04-23 NOTE — Progress Notes (Signed)
 PROGRESS NOTE    Juan [name]  FMW:[id number] DOB: 04-07-1966 DOA: 04/19/2024 PCP: Alvia Bring, DO   Brief Narrative:  This 58 yrs .old Male with medical history significant of hypertension, cirrhosis with portal hypertension, alcohol abuse, C. difficile, GI bleed, PTSD, bipolar disorder, and GERD presents in the ED  with high fever.  Patient was recently hospitalized from 7/12-7/18 with fever noted to have blood cultures positive for streptococcus mitis/oralis with prior cultures in outside facility positive for strep paradanguinis.  Echocardiogram could not rule out aortic valve vegetation.  Repeat blood cultures were negative.  He was treated with 6 weeks of IV Rocephin  through PICC line which were completed 5 days ago.  Also during hospitalization there was concern for the possibility of gallstone pancreatitis/possible cholecystitis but not a surgical candidate given comorbidities.  He makes note that he was referred to [facility] as it was thought that this may have been the initial source for  the bacteremia.  Patient presented with fever cough and stomach pain.  In the ED pertinent labs include hemoglobin 9.4, platelet count 75K lactic acid normal, lipase 139 which is improved.  Chest x-ray shows lower lobe opacities due to atelectasis or mild infiltrate. Patient was admitted for further evaluation and started on empiric antibiotics.  Assessment & Plan:   Principal Problem:   Bacterial endocarditis Active Problems:   SIRS (systemic inflammatory response syndrome) (HCC)   Abnormal chest x-ray   Normocytic anemia   Prolonged QT interval   Cirrhosis (HCC)   Cholelithiasis   Thrombocytopenia (HCC)   History of alcohol abuse   Pneumonia   Chronic biliary pancreatitis (HCC)  Suspected pneumonia: Patient presented with fever 101.8 at home with tachypnea.   Patient had just recently been treated with IV antibiotics for 6 weeks for strep species bacteremia thought possibly secondary to  endocarditis.  Patient had initially been treated with empiric antibiotics ( vancomycin  and cefepmine). Blood cultures >  No growth for 3 days CRP, ESR and procalcitonin unremarkable. 2D echo shows LV EF 70 to 75%, No vegetations noted. ID  recommended to continue ceftriaxone . Patient will likely need TEE. Cardiology is consulted, tentatively scheduled for TEE tomorrow. Continue vancomycin  for C. difficile prevention.  Abnormal chest x-ray: Chest x-ray showing streaky lower lobe opacities due to atelectasis or mild infiltrate.   COVID, influenza, and RSV screening were negative.   Procalcitonin unremarkable. Respiratory viral panel negative.   Normocytic anemia: Acute on chronic.   Hemoglobin noted to be 9.4, but had previously been 11.2 when checked 2 weeks ago.   Baseline hemoglobin appears to be around 7-9 g/dL. - Recheck H&H   Prolonged QT interval: QTc noted to be prolonged at 503.   Recheck EKG shows improved QT interval. - Avoid QT prolonging medications   History of liver cirrhosis: Portal hypertension Patient noted to have  lower extremity swelling.   History of decompensated cirrhosis. He had not been taking Lasix  or spironolactone .    Patient notes that spironolactone  had been discontinued due to gynecomastia.  Following with transplant clinic at [facility].  EGD done on 7/15 did not show any esophageal varices, but showed portal hypertensive gastropathy.  - Furosemide  40 mg IV x 1 dose, then resume Lasix  40 mg daily.   Cholelithiasis: Patient with prior history of cholelithiasis with concern for cholecystitis versus gallstone pancreatitis during last hospitalization.  Patient was referred to [facility] as this was thought to be a possible source for his bacteremia.  Patient denies any complaints of abdominal  pain at this time. Continue outpatient follow-up with GI at [facility]. CT A/P findings suggestive of acute pancreatitis cystic structure in the pancreatic head.  Cholelithiasis,  morphological changes of cirrhosis.   Thrombocytopenia: Chronic.  Platelet count 75 which appears around patient's baseline.  Thought secondary to history of cirrhosis. - Continue to monitor   History alcohol abuse: Patient currently is not drinking alcohol. - Encouraged continued cessation of alcohol use.    DVT prophylaxis: SCDs Code Status:Full code Family Communication: No family at bed side Disposition Plan:   Status is: Inpatient Remains inpatient appropriate because:     Admitted for community-acquired pneumonia,  started on empiric antibiotics.  Consultants:  Cardiology Infectious diseases Procedures: Echocardiogram  Antimicrobials:  Anti-infectives (From admission, onward)    Start     Dose/Rate Route Frequency Ordered Stop   04/21/24 1330  vancomycin  (VANCOCIN ) capsule 125 mg        125 mg Oral 2 times daily 04/21/24 1236     04/20/24 1445  cefTRIAXone  (ROCEPHIN ) 2 g in sodium chloride  0.9 % 100 mL IVPB        2 g 200 mL/hr over 30 Minutes Intravenous Every 24 hours 04/20/24 1347     04/20/24 1400  ceFEPIme  (MAXIPIME ) 2 g in sodium chloride  0.9 % 100 mL IVPB  Status:  Discontinued        2 g 200 mL/hr over 30 Minutes Intravenous Every 8 hours 04/20/24 1312 04/20/24 1347   04/20/24 0015  vancomycin  (VANCOCIN ) IVPB 1000 mg/200 mL premix       Placed in Followed by Linked Group   1,000 mg 200 mL/hr over 60 Minutes Intravenous  Once 04/19/24 2236 04/20/24 0130   04/19/24 2300  vancomycin  (VANCOCIN ) IVPB 1000 mg/200 mL premix       Placed in Followed by Linked Group   1,000 mg 200 mL/hr over 60 Minutes Intravenous  Once 04/19/24 2236 04/20/24 0028   04/19/24 2245  ceFEPIme  (MAXIPIME ) 2 g in sodium chloride  0.9 % 100 mL IVPB        2 g 200 mL/hr over 30 Minutes Intravenous  Once 04/19/24 2236 04/19/24 2337       Subjective: Patient was seen and examined at bedside. Overnight events noted. Patient reports feeling improved.  He still reports a mild cough  but denies any fever.   He is tentatively scheduled for TEE tomorrow.  Objective: Vitals:   04/22/24 2048 04/23/24 0615 04/23/24 0804 04/23/24 1144  BP: 116/76 120/79 104/73 122/75  Pulse: 84 89 87 87  Resp: 20 18 18 18   Temp: 98.1 F (36.7 C) 98 F (36.7 C) 97.6 F (36.4 C) 97.7 F (36.5 C)  TempSrc: Oral Oral Oral Oral  SpO2: 97% 95% 98% 97%  Weight:      Height:        Intake/Output Summary (Last 24 hours) at 04/23/2024 1212 Last data filed at 04/23/2024 1033 Gross per 24 hour  Intake 843 ml  Output 2150 ml  Net -1307 ml   Filed Weights   04/19/24 2035 04/20/24 0440  Weight: 87.9 kg 86 kg    Examination:  General exam: Appears calm and comfortable, not in any acute distress. Respiratory system: CTA B/L. Respiratory effort normal. RR 15 Cardiovascular system: S1 & S2 heard, RRR. No JVD, murmurs, rubs, gallops or clicks. Gastrointestinal system: Abdomen is non distended, soft and non tender. Normal bowel sounds heard. Central nervous system: Alert and oriented X 3. No focal neurological deficits. Extremities: No edema,  no cyanosis, no clubbing. Skin: No rashes, lesions or ulcers Psychiatry: Judgement and insight appear normal. Mood & affect appropriate.     Data Reviewed: I have personally reviewed following labs and imaging studies  CBC: Recent Labs  Lab 04/19/24 2100 04/20/24 0820 04/21/24 0435 04/23/24 0356  WBC 6.7  --  6.7 7.2  NEUTROABS 3.2  --   --   --   HGB 9.4* 8.9* 9.8* 9.9*  HCT 27.9* 26.0* 28.6* 29.9*  MCV 90.6  --  90.2 92.3  PLT 75*  --  71* 82*   Basic Metabolic Panel: Recent Labs  Lab 04/19/24 2100 04/21/24 0435 04/23/24 0356  NA 140 137 136  K 4.2 3.3* 3.3*  CL 108 104 103  CO2 24 24 25   GLUCOSE 107* 91 93  BUN 16 11 8   CREATININE 1.05 0.82 0.91  CALCIUM  8.4* 8.7* 8.8*  MG  --   --  1.2*  PHOS  --   --  4.6   GFR: Estimated Creatinine Clearance: 91.4 mL/min (by C-G formula based on SCr of 0.91 mg/dL). Liver Function  Tests: Recent Labs  Lab 04/19/24 2100 04/21/24 0435  AST 76* 65*  ALT 26 24  ALKPHOS 90 62  BILITOT 2.4* 5.3*  PROT 5.4* 5.6*  ALBUMIN 2.7* 2.0*   Recent Labs  Lab 04/19/24 2100  LIPASE 139*   No results for input(s): AMMONIA in the last 168 hours. Coagulation Profile: No results for input(s): INR, PROTIME in the last 168 hours. Cardiac Enzymes: No results for input(s): CKTOTAL, CKMB, CKMBINDEX, TROPONINI in the last 168 hours. BNP (last 3 results) Recent Labs    02/20/24 2159 04/19/24 2100  PROBNP 229.0 106.0   HbA1C: No results for input(s): HGBA1C in the last 72 hours. CBG: No results for input(s): GLUCAP in the last 168 hours. Lipid Profile: No results for input(s): CHOL, HDL, LDLCALC, TRIG, CHOLHDL, LDLDIRECT in the last 72 hours. Thyroid  Function Tests: No results for input(s): TSH, T4TOTAL, FREET4, T3FREE, THYROIDAB in the last 72 hours. Anemia Panel: No results for input(s): VITAMINB12, FOLATE, FERRITIN, TIBC, IRON, RETICCTPCT in the last 72 hours. Sepsis Labs: Recent Labs  Lab 04/19/24 2100 04/20/24 0820  PROCALCITON  --  <0.10  LATICACIDVEN 1.6  --     Recent Results (from the past 240 hours)  Resp panel by RT-PCR (RSV, Flu A&B, Covid) Anterior Nasal Swab     Status: None   Collection Time: 04/19/24  9:51 PM   Specimen: Anterior Nasal Swab  Result Value Ref Range Status   SARS Coronavirus 2 by RT PCR NEGATIVE NEGATIVE Final    Comment: (NOTE) SARS-CoV-2 target nucleic acids are NOT DETECTED.  The SARS-CoV-2 RNA is generally detectable in upper respiratory specimens during the acute phase of infection. The lowest concentration of SARS-CoV-2 viral copies this assay can detect is 138 copies/mL. A negative result does not preclude SARS-Cov-2 infection and should not be used as the sole basis for treatment or other patient management decisions. A negative result may occur with  improper specimen  collection/handling, submission of specimen other than nasopharyngeal swab, presence of viral mutation(s) within the areas targeted by this assay, and inadequate number of viral copies(<138 copies/mL). A negative result must be combined with clinical observations, patient history, and epidemiological information. The expected result is Negative.  Fact Sheet for Patients:  BloggerCourse.com  Fact Sheet for Healthcare Providers:  SeriousBroker.it  This test is no t yet approved or cleared by the [place]  FDA and  has been authorized for detection and/or diagnosis of SARS-CoV-2 by FDA under an Emergency Use Authorization (EUA). This EUA will remain  in effect (meaning this test can be used) for the duration of the COVID-19 declaration under Section 564(b)(1) of the Act, 21 U.S.C.section 360bbb-3(b)(1), unless the authorization is terminated  or revoked sooner.       Influenza A by PCR NEGATIVE NEGATIVE Final   Influenza B by PCR NEGATIVE NEGATIVE Final    Comment: (NOTE) The Xpert Xpress SARS-CoV-2/FLU/RSV plus assay is intended as an aid in the diagnosis of influenza from Nasopharyngeal swab specimens and should not be used as a sole basis for treatment. Nasal washings and aspirates are unacceptable for Xpert Xpress SARS-CoV-2/FLU/RSV testing.  Fact Sheet for Patients: BloggerCourse.com  Fact Sheet for Healthcare Providers: SeriousBroker.it  This test is not yet approved or cleared by the [place]  FDA and has been authorized for detection and/or diagnosis of SARS-CoV-2 by FDA under an Emergency Use Authorization (EUA). This EUA will remain in effect (meaning this test can be used) for the duration of the COVID-19 declaration under Section 564(b)(1) of the Act, 21 U.S.C. section 360bbb-3(b)(1), unless the authorization is terminated or revoked.     Resp Syncytial  Virus by PCR NEGATIVE NEGATIVE Final    Comment: (NOTE) Fact Sheet for Patients: BloggerCourse.com  Fact Sheet for Healthcare Providers: SeriousBroker.it  This test is not yet approved or cleared by the [place]  FDA and has been authorized for detection and/or diagnosis of SARS-CoV-2 by FDA under an Emergency Use Authorization (EUA). This EUA will remain in effect (meaning this test can be used) for the duration of the COVID-19 declaration under Section 564(b)(1) of the Act, 21 U.S.C. section 360bbb-3(b)(1), unless the authorization is terminated or revoked.  Performed at [facility], [address] [place], [place] [address]   Blood culture (routine x 2)     Status: None (Preliminary result)   Collection Time: 04/19/24 10:04 PM   Specimen: BLOOD  Result Value Ref Range Status   Specimen Description   Final    BLOOD LEFT ANTECUBITAL Performed at [facility], [address] [place], [place] [address]    Special Requests   Final    BOTTLES DRAWN AEROBIC AND ANAEROBIC Blood Culture adequate volume Performed at [facility], [address] [place], [place] [address]    Culture   Final    NO GROWTH 3 DAYS Performed at [facility], [address], [place], [place] [address]    Report Status PENDING  Incomplete  Blood culture (routine x 2)     Status: None (Preliminary result)   Collection Time: 04/19/24 10:30 PM   Specimen: BLOOD LEFT HAND  Result Value Ref Range Status   Specimen Description   Final    BLOOD LEFT HAND Performed at [facility], [address] [place], [place] [address]    Special Requests   Final    BOTTLES DRAWN AEROBIC AND ANAEROBIC Blood Culture results may not be optimal due to an inadequate volume of blood received in culture bottles Performed at [facility], [address] [place], [place] [address]    Culture   Final    NO  GROWTH 3 DAYS Performed at [facility], [address], [place], [place] [address]    Report Status PENDING  Incomplete  Respiratory (~20 pathogens) panel by PCR  Status: None   Collection Time: 04/20/24  8:30 AM   Specimen: Nasopharyngeal Swab; Respiratory  Result Value Ref Range Status   Adenovirus NOT DETECTED NOT DETECTED Final   Coronavirus 229E NOT DETECTED NOT DETECTED Final    Comment: (NOTE) The Coronavirus on the Respiratory Panel, DOES NOT test for the novel  Coronavirus (2019 nCoV)    Coronavirus HKU1 NOT DETECTED NOT DETECTED Final   Coronavirus NL63 NOT DETECTED NOT DETECTED Final   Coronavirus OC43 NOT DETECTED NOT DETECTED Final   Metapneumovirus NOT DETECTED NOT DETECTED Final   Rhinovirus / Enterovirus NOT DETECTED NOT DETECTED Final   Influenza A NOT DETECTED NOT DETECTED Final   Influenza B NOT DETECTED NOT DETECTED Final   Parainfluenza Virus 1 NOT DETECTED NOT DETECTED Final   Parainfluenza Virus 2 NOT DETECTED NOT DETECTED Final   Parainfluenza Virus 3 NOT DETECTED NOT DETECTED Final   Parainfluenza Virus 4 NOT DETECTED NOT DETECTED Final   Respiratory Syncytial Virus NOT DETECTED NOT DETECTED Final   Bordetella pertussis NOT DETECTED NOT DETECTED Final   Bordetella Parapertussis NOT DETECTED NOT DETECTED Final   Chlamydophila pneumoniae NOT DETECTED NOT DETECTED Final   Mycoplasma pneumoniae NOT DETECTED NOT DETECTED Final    Comment: Performed at [facility], [address] [place], [place] [address]    Radiology Studies: CT ABDOMEN PELVIS W CONTRAST Result Date: 04/21/2024 CLINICAL DATA:  Cholecystitis. EXAM: CT ABDOMEN AND PELVIS WITH CONTRAST TECHNIQUE: Multidetector CT imaging of the abdomen and pelvis was performed using the standard protocol following bolus administration of intravenous contrast. RADIATION DOSE REDUCTION: This exam was performed according to the departmental dose-optimization program which includes automated exposure  control, adjustment of the mA and/or kV according to patient size and/or use of iterative reconstruction technique. CONTRAST:  75mL OMNIPAQUE  IOHEXOL  350 MG/ML SOLN COMPARISON:  03/03/2024. FINDINGS: Lower chest: Scattered coronary artery calcifications are noted. No acute abnormality. Hepatobiliary: The liver has a nodular contour, compatible with underlying cirrhosis. A 7 mm hypodensity is present in the posterior right lobe of the liver, unchanged from the prior exam. Stones are noted within the gallbladder. No biliary ductal dilatation is seen. Pancreas: A cystic region is present in the pancreas, the largest measuring 11 mm in the pancreatic head, previously characterized by MRI. No pancreatic ductal dilatation is seen. There is mild peripancreatic fat stranding and edema. Spleen: Normal in size without focal abnormality. Adrenals/Urinary Tract: The adrenal glands are within normal limits. The kidneys enhance symmetrically. A small calculus is noted in the lower pole of the left kidney. No hydronephrosis bilaterally. The bladder is unremarkable. Stomach/Bowel: There is a small hiatal hernia. The stomach is otherwise within normal limits. No bowel obstruction, free air, or pneumatosis is seen. Appendix appears normal. A moderate amount of retained stools present in the colon. Vascular/Lymphatic: Aortic atherosclerosis. 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 Nonspecific prominent lymph nodes are present in the gastrohepatic ligament and porta hepatis, likely reactive. Reproductive: Prostate is unremarkable. Other: No ascites.  A fat containing umbilical hernia is noted. Musculoskeletal: No acute osseous abnormality. IMPRESSION: 1. Findings suggestive of acute pancreatitis. 2. 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 3. Morphologic changes of cirrhosis and portal hypertension. 4. Cholelithiasis. 5.  Small hiatal hernia. 6. Nonobstructive left renal calculus. 7. Aortic atherosclerosis. Electronically Signed   By: Leita Birmingham M.D.   On: 04/21/2024 18:37   Scheduled Meds:  furosemide   40 mg Oral Daily   loratadine   10 mg Oral Daily   saccharomyces boulardii  250 mg Oral BID   sodium chloride  flush  3 mL Intravenous Q12H   vancomycin   125 mg Oral BID   Continuous Infusions:  cefTRIAXone  (ROCEPHIN )  IV 2 g (04/22/24 1441)     LOS: 2 days    Time spent: 35 Mins    Darcel Dawley, MD [facility]   If 7PM-7AM, please contact night-coverage

## 2024-04-24 ENCOUNTER — Encounter (HOSPITAL_COMMUNITY): Payer: Self-pay

## 2024-04-24 ENCOUNTER — Encounter: Payer: Self-pay | Admitting: Sports Medicine

## 2024-04-24 ENCOUNTER — Encounter (HOSPITAL_COMMUNITY)
Admission: EM | Disposition: A | Discharge status: Home / Self Care | Payer: Self-pay | Source: Home / Self Care | Attending: Family Medicine

## 2024-04-24 ENCOUNTER — Other Ambulatory Visit (HOSPITAL_COMMUNITY)

## 2024-04-24 DIAGNOSIS — I350 Nonrheumatic aortic (valve) stenosis: Secondary | ICD-10-CM

## 2024-04-24 DIAGNOSIS — I340 Nonrheumatic mitral (valve) insufficiency: Secondary | ICD-10-CM

## 2024-04-24 LAB — PHOSPHORUS: Phosphorus: 3.8 mg/dL (ref 2.5–4.6)

## 2024-04-24 LAB — CBC
HCT: 29 % — ABNORMAL LOW (ref 39.0–52.0)
Hemoglobin: 9.7 g/dL — ABNORMAL LOW (ref 13.0–17.0)
MCH: 30.9 pg (ref 26.0–34.0)
MCHC: 33.4 g/dL (ref 30.0–36.0)
MCV: 92.4 fL (ref 80.0–100.0)
Platelets: 81 K/uL — ABNORMAL LOW (ref 150–400)
RBC: 3.14 MIL/uL — ABNORMAL LOW (ref 4.22–5.81)
RDW: 20.1 % — ABNORMAL HIGH (ref 11.5–15.5)
WBC: 7 K/uL (ref 4.0–10.5)
nRBC: 0 % (ref 0.0–0.2)

## 2024-04-24 LAB — BASIC METABOLIC PANEL WITH GFR
Anion gap: 7 (ref 5–15)
BUN: 12 mg/dL (ref 6–20)
CO2: 25 mmol/L (ref 22–32)
Calcium: 8.3 mg/dL — ABNORMAL LOW (ref 8.9–10.3)
Chloride: 103 mmol/L (ref 98–111)
Creatinine, Ser: 0.88 mg/dL (ref 0.61–1.24)
GFR, Estimated: >60 mL/min (ref >60)
Glucose, Bld: 84 mg/dL (ref 70–99)
Potassium: 3.5 mmol/L (ref 3.5–5.1)
Sodium: 135 mmol/L (ref 135–145)

## 2024-04-24 LAB — MAGNESIUM: Magnesium: 1.6 mg/dL — ABNORMAL LOW (ref 1.7–2.4)

## 2024-04-24 SURGERY — TRANSESOPHAGEAL ECHOCARDIOGRAM (TEE) (CATHLAB)
Anesthesia: Monitor Anesthesia Care

## 2024-04-24 MED ORDER — MAGNESIUM SULFATE 2 GM/50ML IV SOLN
2.0000 g | Freq: Once | INTRAVENOUS | Status: AC
  Administered 2024-04-24: 2 g via INTRAVENOUS
  Filled 2024-04-24: qty 50

## 2024-04-24 NOTE — Progress Notes (Signed)
 PROGRESS NOTE    Juan [name]  FMW:[id number] DOB: 1966-07-18 DOA: 04/19/2024 PCP: Alvia Bring, DO   Brief Narrative:  This 58 yrs .old Male with medical history significant of hypertension, cirrhosis with portal hypertension, alcohol abuse, C. difficile, GI bleed, PTSD, bipolar disorder, and GERD presents in the ED  with high fever.  Patient was recently hospitalized from 7/12-7/18 with fever noted to have blood cultures positive for streptococcus mitis/oralis with prior cultures in outside facility positive for strep paradanguinis.  Echocardiogram could not rule out aortic valve vegetation.  Repeat blood cultures were negative.  He was treated with 6 weeks of IV Rocephin  through PICC line which were completed 5 days ago.  Also during hospitalization there was concern for the possibility of gallstone pancreatitis/possible cholecystitis but not a surgical candidate given comorbidities.  He makes note that he was referred to [facility] as it was thought that this may have been the initial source for  the bacteremia.  Patient presented with fever, cough and stomach pain.  In the ED pertinent labs include hemoglobin 9.4, platelet count 75K lactic acid normal, lipase 139 which is improved.  Chest x-ray shows lower lobe opacities due to atelectasis or mild infiltrate. Patient was admitted for further evaluation and started on empiric antibiotics.  Assessment & Plan:   Principal Problem:   Bacterial endocarditis Active Problems:   SIRS (systemic inflammatory response syndrome) (HCC)   Abnormal chest x-ray   Normocytic anemia   Prolonged QT interval   Cirrhosis (HCC)   Cholelithiasis   Thrombocytopenia (HCC)   History of alcohol abuse   Pneumonia   Chronic biliary pancreatitis (HCC)  Suspected pneumonia: Concern for recurrence of endocarditis : Patient presented with fever 101.8 at home with tachypnea.   Patient had just recently been treated with IV antibiotics for 6 weeks for strep species  bacteremia thought possibly secondary to endocarditis. Patient had initially been treated with empiric antibiotics ( vancomycin  and cefepmine). Blood cultures >  No growth for 3 days CRP, ESR and procalcitonin unremarkable. 2D echo shows LV EF 70 to 75%, No vegetations noted. ID  recommended to continue ceftriaxone . Patient will likely need TEE. Cardiology is consulted, history of liver cirrhosis , suspicion of  esophagus varices cannot be ruled out therefore the risk of performing the procedure far outweigh the benefits. ID notified. Follow up ID for further recommendation. Continue vancomycin  for C. difficile prevention.  Abnormal chest x-ray: Chest x-ray showing streaky lower lobe opacities due to atelectasis or mild infiltrate.   COVID, influenza, and RSV screening were negative.   Procalcitonin unremarkable. Respiratory viral panel negative.   Normocytic anemia: Acute on chronic.   Hemoglobin noted to be 9.4, but had previously been 11.2 when checked 2 weeks ago.   Baseline hemoglobin appears to be around 7-9 g/dL. No signs of any obvious bleeding.  Monitor H&H.   Prolonged QT interval: QTc noted to be prolonged at 503.   Recheck EKG shows improved QT interval. - Avoid QT prolonging medications   History of liver cirrhosis: Portal hypertension Patient noted to have  lower extremity swelling.   History of decompensated cirrhosis. He had not been taking Lasix  or spironolactone .    Patient notes that spironolactone  had been discontinued due to gynecomastia.  Following with transplant clinic at [facility].  EGD done on 7/15 did not show any esophageal varices, but showed portal hypertensive gastropathy.  - Furosemide  40 mg IV x 1 dose, then resume Lasix  40 mg daily.   Cholelithiasis: Patient with  prior history of cholelithiasis with concern for cholecystitis versus gallstone pancreatitis during last hospitalization.  Patient was referred to [facility] as this was thought to be a possible  source for his bacteremia.  Patient denies any complaints of abdominal pain at this time.Continue outpatient follow-up with GI at [facility]. CT A/P findings suggestive of acute pancreatitis,  cystic structure in the pancreatic head.  Cholelithiasis, morphological changes of cirrhosis.   Thrombocytopenia: Chronic.  Platelet count 75 which appears around patient's baseline.  Thought secondary to history of cirrhosis. - Continue to monitor   History alcohol abuse: Patient currently is not drinking alcohol. - Encouraged continued cessation of alcohol use.   DVT prophylaxis: SCDs Code Status:Full code Family Communication: No family at bed side Disposition Plan:   Status is: Inpatient Remains inpatient appropriate because:     Admitted for community-acquired pneumonia,  started on empiric antibiotics.  ID recommended TEE to rule out infective endocarditis  Consultants:  Cardiology Infectious diseases Procedures: Echocardiogram  Antimicrobials:  Anti-infectives (From admission, onward)    Start     Dose/Rate Route Frequency Ordered Stop   04/21/24 1330  vancomycin  (VANCOCIN ) capsule 125 mg        125 mg Oral 2 times daily 04/21/24 1236     04/20/24 1445  cefTRIAXone  (ROCEPHIN ) 2 g in sodium chloride  0.9 % 100 mL IVPB        2 g 200 mL/hr over 30 Minutes Intravenous Every 24 hours 04/20/24 1347     04/20/24 1400  ceFEPIme  (MAXIPIME ) 2 g in sodium chloride  0.9 % 100 mL IVPB  Status:  Discontinued        2 g 200 mL/hr over 30 Minutes Intravenous Every 8 hours 04/20/24 1312 04/20/24 1347   04/20/24 0015  vancomycin  (VANCOCIN ) IVPB 1000 mg/200 mL premix       Placed in Followed by Linked Group   1,000 mg 200 mL/hr over 60 Minutes Intravenous  Once 04/19/24 2236 04/20/24 0130   04/19/24 2300  vancomycin  (VANCOCIN ) IVPB 1000 mg/200 mL premix       Placed in Followed by Linked Group   1,000 mg 200 mL/hr over 60 Minutes Intravenous  Once 04/19/24 2236 04/20/24 0028   04/19/24 2245   ceFEPIme  (MAXIPIME ) 2 g in sodium chloride  0.9 % 100 mL IVPB        2 g 200 mL/hr over 30 Minutes Intravenous  Once 04/19/24 2236 04/19/24 2337       Subjective: Patient was seen and examined at bedside. Overnight events noted. Patient reports feeling improved. He still reports a mild cough but denies any fever.   Patient feels upset when he was told he will not have TEE due to concern for esophageal varices.  Objective: Vitals:   04/23/24 1958 04/23/24 2356 04/24/24 0429 04/24/24 0855  BP: 105/67 113/70 112/72 105/71  Pulse: 88  82 84  Resp: 18 18 18 18   Temp: 98.1 F (36.7 C) 98 F (36.7 C) 97.9 F (36.6 C) 98 F (36.7 C)  TempSrc: Oral Oral Oral Oral  SpO2: 98%  99% 99%  Weight:      Height:        Intake/Output Summary (Last 24 hours) at 04/24/2024 1202 Last data filed at 04/24/2024 0430 Gross per 24 hour  Intake 460 ml  Output 1425 ml  Net -965 ml   Filed Weights   04/19/24 2035 04/20/24 0440  Weight: 87.9 kg 86 kg    Examination:  General exam: Appears calm and comfortable,  not in any acute distress. Respiratory system: CTA B/L. Respiratory effort normal. RR 14 Cardiovascular system: S1 & S2 heard, RRR. No JVD, murmurs, rubs, gallops or clicks. Gastrointestinal system: Abdomen is non distended, soft and non tender. Normal bowel sounds heard. Central nervous system: Alert and oriented X 3. No focal neurological deficits. Extremities: No edema, no cyanosis, no clubbing. Skin: No rashes, lesions or ulcers Psychiatry: Judgement and insight appear normal. Mood & affect appropriate.     Data Reviewed: I have personally reviewed following labs and imaging studies  CBC: Recent Labs  Lab 04/19/24 2100 04/20/24 0820 04/21/24 0435 04/23/24 0356 04/24/24 0527  WBC 6.7  --  6.7 7.2 7.0  NEUTROABS 3.2  --   --   --   --   HGB 9.4* 8.9* 9.8* 9.9* 9.7*  HCT 27.9* 26.0* 28.6* 29.9* 29.0*  MCV 90.6  --  90.2 92.3 92.4  PLT 75*  --  71* 82* 81*   Basic Metabolic  Panel: Recent Labs  Lab 04/19/24 2100 04/21/24 0435 04/23/24 0356 04/24/24 0527  NA 140 137 136 135  K 4.2 3.3* 3.3* 3.5  CL 108 104 103 103  CO2 24 24 25 25   GLUCOSE 107* 91 93 84  BUN 16 11 8 12   CREATININE 1.05 0.82 0.91 0.88  CALCIUM  8.4* 8.7* 8.8* 8.3*  MG  --   --  1.2* 1.6*  PHOS  --   --  4.6 3.8   GFR: Estimated Creatinine Clearance: 94.5 mL/min (by C-G formula based on SCr of 0.88 mg/dL). Liver Function Tests: Recent Labs  Lab 04/19/24 2100 04/21/24 0435  AST 76* 65*  ALT 26 24  ALKPHOS 90 62  BILITOT 2.4* 5.3*  PROT 5.4* 5.6*  ALBUMIN 2.7* 2.0*   Recent Labs  Lab 04/19/24 2100  LIPASE 139*   No results for input(s): AMMONIA in the last 168 hours. Coagulation Profile: No results for input(s): INR, PROTIME in the last 168 hours. Cardiac Enzymes: No results for input(s): CKTOTAL, CKMB, CKMBINDEX, TROPONINI in the last 168 hours. BNP (last 3 results) Recent Labs    02/20/24 2159 04/19/24 2100  PROBNP 229.0 106.0   HbA1C: No results for input(s): HGBA1C in the last 72 hours. CBG: No results for input(s): GLUCAP in the last 168 hours. Lipid Profile: No results for input(s): CHOL, HDL, LDLCALC, TRIG, CHOLHDL, LDLDIRECT in the last 72 hours. Thyroid  Function Tests: No results for input(s): TSH, T4TOTAL, FREET4, T3FREE, THYROIDAB in the last 72 hours. Anemia Panel: No results for input(s): VITAMINB12, FOLATE, FERRITIN, TIBC, IRON, RETICCTPCT in the last 72 hours. Sepsis Labs: Recent Labs  Lab 04/19/24 2100 04/20/24 0820  PROCALCITON  --  <0.10  LATICACIDVEN 1.6  --     Recent Results (from the past 240 hours)  Resp panel by RT-PCR (RSV, Flu A&B, Covid) Anterior Nasal Swab     Status: None   Collection Time: 04/19/24  9:51 PM   Specimen: Anterior Nasal Swab  Result Value Ref Range Status   SARS Coronavirus 2 by RT PCR NEGATIVE NEGATIVE Final    Comment: (NOTE) SARS-CoV-2 target nucleic  acids are NOT DETECTED.  The SARS-CoV-2 RNA is generally detectable in upper respiratory specimens during the acute phase of infection. The lowest concentration of SARS-CoV-2 viral copies this assay can detect is 138 copies/mL. A negative result does not preclude SARS-Cov-2 infection and should not be used as the sole basis for treatment or other patient management decisions. A negative result may occur with  improper specimen collection/handling, submission of specimen other than nasopharyngeal swab, presence of viral mutation(s) within the areas targeted by this assay, and inadequate number of viral copies(<138 copies/mL). A negative result must be combined with clinical observations, patient history, and epidemiological information. The expected result is Negative.  Fact Sheet for Patients:  BloggerCourse.com  Fact Sheet for Healthcare Providers:  SeriousBroker.it  This test is no t yet approved or cleared by the [place]  FDA and  has been authorized for detection and/or diagnosis of SARS-CoV-2 by FDA under an Emergency Use Authorization (EUA). This EUA will remain  in effect (meaning this test can be used) for the duration of the COVID-19 declaration under Section 564(b)(1) of the Act, 21 U.S.C.section 360bbb-3(b)(1), unless the authorization is terminated  or revoked sooner.       Influenza A by PCR NEGATIVE NEGATIVE Final   Influenza B by PCR NEGATIVE NEGATIVE Final    Comment: (NOTE) The Xpert Xpress SARS-CoV-2/FLU/RSV plus assay is intended as an aid in the diagnosis of influenza from Nasopharyngeal swab specimens and should not be used as a sole basis for treatment. Nasal washings and aspirates are unacceptable for Xpert Xpress SARS-CoV-2/FLU/RSV testing.  Fact Sheet for Patients: BloggerCourse.com  Fact Sheet for Healthcare Providers: SeriousBroker.it  This  test is not yet approved or cleared by the [place]  FDA and has been authorized for detection and/or diagnosis of SARS-CoV-2 by FDA under an Emergency Use Authorization (EUA). This EUA will remain in effect (meaning this test can be used) for the duration of the COVID-19 declaration under Section 564(b)(1) of the Act, 21 U.S.C. section 360bbb-3(b)(1), unless the authorization is terminated or revoked.     Resp Syncytial Virus by PCR NEGATIVE NEGATIVE Final    Comment: (NOTE) Fact Sheet for Patients: BloggerCourse.com  Fact Sheet for Healthcare Providers: SeriousBroker.it  This test is not yet approved or cleared by the [place]  FDA and has been authorized for detection and/or diagnosis of SARS-CoV-2 by FDA under an Emergency Use Authorization (EUA). This EUA will remain in effect (meaning this test can be used) for the duration of the COVID-19 declaration under Section 564(b)(1) of the Act, 21 U.S.C. section 360bbb-3(b)(1), unless the authorization is terminated or revoked.  Performed at [facility], [address] [place], [place] [address]   Blood culture (routine x 2)     Status: None (Preliminary result)   Collection Time: 04/19/24 10:04 PM   Specimen: BLOOD  Result Value Ref Range Status   Specimen Description   Final    BLOOD LEFT ANTECUBITAL Performed at [facility], [address] [place], [place] [address]    Special Requests   Final    BOTTLES DRAWN AEROBIC AND ANAEROBIC Blood Culture adequate volume Performed at [facility], [address] [place], [place] [address]    Culture   Final    NO GROWTH 4 DAYS Performed at [facility], [address], [place], [place] [address]    Report Status PENDING  Incomplete  Blood culture (routine x 2)     Status: None (Preliminary result)   Collection Time: 04/19/24 10:30 PM   Specimen: BLOOD LEFT HAND  Result Value Ref  Range Status   Specimen Description   Final    BLOOD LEFT HAND Performed at [facility], [address] [place], [place] [address]    Special Requests   Final    BOTTLES DRAWN AEROBIC  AND ANAEROBIC Blood Culture results may not be optimal due to an inadequate volume of blood received in culture bottles Performed at [facility], [address] [place], [place] [address]    Culture   Final    NO GROWTH 4 DAYS Performed at [facility], [address], [place], [place] [address]    Report Status PENDING  Incomplete  Respiratory (~20 pathogens) panel by PCR     Status: None   Collection Time: 04/20/24  8:30 AM   Specimen: Nasopharyngeal Swab; Respiratory  Result Value Ref Range Status   Adenovirus NOT DETECTED NOT DETECTED Final   Coronavirus 229E NOT DETECTED NOT DETECTED Final    Comment: (NOTE) The Coronavirus on the Respiratory Panel, DOES NOT test for the novel  Coronavirus (2019 nCoV)    Coronavirus HKU1 NOT DETECTED NOT DETECTED Final   Coronavirus NL63 NOT DETECTED NOT DETECTED Final   Coronavirus OC43 NOT DETECTED NOT DETECTED Final   Metapneumovirus NOT DETECTED NOT DETECTED Final   Rhinovirus / Enterovirus NOT DETECTED NOT DETECTED Final   Influenza A NOT DETECTED NOT DETECTED Final   Influenza B NOT DETECTED NOT DETECTED Final   Parainfluenza Virus 1 NOT DETECTED NOT DETECTED Final   Parainfluenza Virus 2 NOT DETECTED NOT DETECTED Final   Parainfluenza Virus 3 NOT DETECTED NOT DETECTED Final   Parainfluenza Virus 4 NOT DETECTED NOT DETECTED Final   Respiratory Syncytial Virus NOT DETECTED NOT DETECTED Final   Bordetella pertussis NOT DETECTED NOT DETECTED Final   Bordetella Parapertussis NOT DETECTED NOT DETECTED Final   Chlamydophila pneumoniae NOT DETECTED NOT DETECTED Final   Mycoplasma pneumoniae NOT DETECTED NOT DETECTED Final    Comment: Performed at [facility], [address], [place], [place] [address]    Radiology  Studies: No results found.  Scheduled Meds:  furosemide   40 mg Oral Daily   loratadine   10 mg Oral Daily   saccharomyces boulardii  250 mg Oral BID   sodium chloride  flush  3 mL Intravenous Q12H   vancomycin   125 mg Oral BID   Continuous Infusions:  cefTRIAXone  (ROCEPHIN )  IV Stopped (04/23/24 1543)     LOS: 3 days    Time spent: 35 Mins    Darcel Dawley, MD [facility]   If 7PM-7AM, please contact night-coverage

## 2024-04-24 NOTE — Progress Notes (Signed)
 Patient is AXOX4 with no clinical signs of distress or complaints of pain at this time. Safety measures are in place, call light is within reach, and 4P's addressed.

## 2024-04-24 NOTE — Progress Notes (Signed)
 Mobility Specialist Progress Note;   04/24/24 1211  Mobility  Activity Ambulated with assistance  Level of Assistance Standby assist, set-up cues, supervision of patient - no hands on  Assistive Device Front wheel walker  Distance Ambulated (ft) 350 ft  Activity Response Tolerated well  Mobility Referral Yes  Mobility visit 1 Mobility  Mobility Specialist Start Time (ACUTE ONLY) 1211  Mobility Specialist Stop Time (ACUTE ONLY) 1218  Mobility Specialist Time Calculation (min) (ACUTE ONLY) 7 min   Pt agreeable to mobility. Required no physical assistance during ambulation, SV for safety. VSS throughout and no c/o when asked. Pt requested to sit in chair at [facility]. Pt left with all needs met, call bell in reach.    Lauraine Erm Mobility Specialist Please contact via SecureChat or [employer] [phone]

## 2024-04-24 NOTE — Plan of Care (Signed)
   Problem: Education: Goal: Knowledge of General Education information will improve Description Including pain rating scale, medication(s)/side effects and non-pharmacologic comfort measures Outcome: Progressing

## 2024-04-24 NOTE — Progress Notes (Signed)
 [facility]  Date of Admission:  04/19/2024      Total days of antibiotics 6  Vanc PO   Ceftriaxone  8/29             ASSESSMENT: Juan [name] is a 58 y.o. male admitted from home with:   Fever, Chills, Rigors -  Started 2 days after PICC was pulled and ABX stopped with recent 6 week course for streptococcus parasanguinous bacteremia. TEE then showed shaggy mobile density on aortic valve. Cardiology did not want to pursue another TEE now due to concern over cirrhosis / varices, though EGD in July ruled that out.  Abnormal CT scan that suggested possible pancreatitis, though he clinically does not have any findings that support that.  He feels largely well today. He has been on vancomycin  and ceftriaxone  for fevers while awaiting more information. It's not clear what his fever episode was but I would favor stopping antibiotics and following off. We have follow up with ID clinic arranged in a few weeks. His transthoracic echo does not show anything concerning.  - would stop abx  - will see about discharge tomorrow if does OK w/o fevers.   H/O CDiff Infection -  Was on secondary prophylaxis during recent ABX course through 8/30.    PLAN: Stop abx  Follow off antibiotics  Possible D/C tomorrow?    Principal Problem:   Bacterial endocarditis Active Problems:   History of alcohol abuse   Thrombocytopenia (HCC)   Normocytic anemia   Prolonged QT interval   Cirrhosis (HCC)   Pneumonia   SIRS (systemic inflammatory response syndrome) (HCC)   Abnormal chest x-ray   Cholelithiasis   Chronic biliary pancreatitis (HCC)    furosemide   40 mg Oral Daily   loratadine   10 mg Oral Daily   sodium chloride  flush  3 mL Intravenous Q12H    SUBJECTIVE: Juan [name] is frustrated that the TEE was cancelled today given he had EGD at the end of July and TEE then.   He says he feels fine. No symptoms at all. He clarified that he had no fever until after his PICC  Line was removed. No further fevers since admission to the hospital. TTE was done and revealed no signs of worsening aortic valve infection.   Review of Systems: Review of Systems  Constitutional:  Fever: isolated fever outpatient.  HENT:  Negative for congestion and sore throat.   Eyes:  Negative for blurred vision, double vision and photophobia.  Respiratory:  Negative for cough, sputum production and shortness of breath.   Cardiovascular:  Negative for chest pain and leg swelling.  Gastrointestinal:  Negative for abdominal pain, nausea and vomiting.  Genitourinary:  Negative for dysuria.  Musculoskeletal:  Negative for back pain, joint pain and neck pain.  Skin:  Negative for itching and rash.  Neurological:  Negative for dizziness, focal weakness, weakness and headaches.    Allergies  Allergen Reactions   Cucumber Extract Itching and Nausea And Vomiting   Depakote Er [Divalproex Sodium Er] Swelling    Tongue swelling   Depakote [Valproic Acid] Anaphylaxis   Peanut (Diagnostic) Anaphylaxis and Swelling    Reaction to peanut butter, specifically   Peanut Oil Swelling   Shellfish Allergy Itching and Swelling    Shrimp, specifically   Codeine Itching and Rash    OK to take hydrocodone /oxycodone  with no issues   Firvanq  [Vancomycin ] Itching and Other (See Comments)    Redman syndrome after receiving via  IV (unknown date, a few years ago) Patient complains of severe itching after being switched to IV formulation on 04/19/24 OK to take tablet formulation, no issues.     Lactose Intolerance (Gi) Diarrhea and Other (See Comments)    Bloating, flatulence Indigestion Stomach pain   Tylenol  [Acetaminophen ] Other (See Comments)    Hx of cirrhosis   Cantaloupe (Diagnostic) Rash    OBJECTIVE: Vitals:   04/23/24 2356 04/24/24 0429 04/24/24 0855 04/24/24 1305  BP: 113/70 112/72 105/71 126/79  Pulse:  82 84 97  Resp: 18 18 18 18   Temp: 98 F (36.7 C) 97.9 F (36.6 C) 98 F  (36.7 C) 97.8 F (36.6 C)  TempSrc: Oral Oral Oral Oral  SpO2:  99% 99% 98%  Weight:      Height:       Body mass index is 31.53 kg/m.  Physical Exam Vitals reviewed.  Constitutional:      Appearance: Normal appearance. He is not ill-appearing.  HENT:     Mouth/Throat:     Mouth: Mucous membranes are moist.  Eyes:     Extraocular Movements: Extraocular movements intact.     Conjunctiva/sclera: Conjunctivae normal.     Pupils: Pupils are equal, round, and reactive to light.  Cardiovascular:     Rate and Rhythm: Normal rate.     Heart sounds: Murmur heard.  Abdominal:     General: Bowel sounds are normal. There is no distension.     Palpations: Abdomen is soft.  Musculoskeletal:        General: No swelling.  Skin:    General: Skin is warm and dry.  Neurological:     Mental Status: He is alert and oriented to person, place, and time.     Lab Results Lab Results  Component Value Date   WBC 7.0 04/24/2024   HGB 9.7 (L) 04/24/2024   HCT 29.0 (L) 04/24/2024   MCV 92.4 04/24/2024   PLT 81 (L) 04/24/2024    Lab Results  Component Value Date   CREATININE 0.88 04/24/2024   BUN 12 04/24/2024   NA 135 04/24/2024   K 3.5 04/24/2024   CL 103 04/24/2024   CO2 25 04/24/2024    Lab Results  Component Value Date   ALT 24 04/21/2024   AST 65 (H) 04/21/2024   ALKPHOS 62 04/21/2024   BILITOT 5.3 (H) 04/21/2024     Microbiology: Recent Results (from the past 240 hours)  Resp panel by RT-PCR (RSV, Flu A&B, Covid) Anterior Nasal Swab     Status: None   Collection Time: 04/19/24  9:51 PM   Specimen: Anterior Nasal Swab  Result Value Ref Range Status   SARS Coronavirus 2 by RT PCR NEGATIVE NEGATIVE Final    Comment: (NOTE) SARS-CoV-2 target nucleic acids are NOT DETECTED.  The SARS-CoV-2 RNA is generally detectable in upper respiratory specimens during the acute phase of infection. The lowest concentration of SARS-CoV-2 viral copies this assay can detect is 138  copies/mL. A negative result does not preclude SARS-Cov-2 infection and should not be used as the sole basis for treatment or other patient management decisions. A negative result may occur with  improper specimen collection/handling, submission of specimen other than nasopharyngeal swab, presence of viral mutation(s) within the areas targeted by this assay, and inadequate number of viral copies(<138 copies/mL). A negative result must be combined with clinical observations, patient history, and epidemiological information. The expected result is Negative.  Fact Sheet for Patients:  BloggerCourse.com  Fact  Sheet for Healthcare Providers:  SeriousBroker.it  This test is no t yet approved or cleared by the [place]  FDA and  has been authorized for detection and/or diagnosis of SARS-CoV-2 by FDA under an Emergency Use Authorization (EUA). This EUA will remain  in effect (meaning this test can be used) for the duration of the COVID-19 declaration under Section 564(b)(1) of the Act, 21 U.S.C.section 360bbb-3(b)(1), unless the authorization is terminated  or revoked sooner.       Influenza A by PCR NEGATIVE NEGATIVE Final   Influenza B by PCR NEGATIVE NEGATIVE Final    Comment: (NOTE) The Xpert Xpress SARS-CoV-2/FLU/RSV plus assay is intended as an aid in the diagnosis of influenza from Nasopharyngeal swab specimens and should not be used as a sole basis for treatment. Nasal washings and aspirates are unacceptable for Xpert Xpress SARS-CoV-2/FLU/RSV testing.  Fact Sheet for Patients: BloggerCourse.com  Fact Sheet for Healthcare Providers: SeriousBroker.it  This test is not yet approved or cleared by the [place]  FDA and has been authorized for detection and/or diagnosis of SARS-CoV-2 by FDA under an Emergency Use Authorization (EUA). This EUA will remain in effect (meaning  this test can be used) for the duration of the COVID-19 declaration under Section 564(b)(1) of the Act, 21 U.S.C. section 360bbb-3(b)(1), unless the authorization is terminated or revoked.     Resp Syncytial Virus by PCR NEGATIVE NEGATIVE Final    Comment: (NOTE) Fact Sheet for Patients: BloggerCourse.com  Fact Sheet for Healthcare Providers: SeriousBroker.it  This test is not yet approved or cleared by the [place]  FDA and has been authorized for detection and/or diagnosis of SARS-CoV-2 by FDA under an Emergency Use Authorization (EUA). This EUA will remain in effect (meaning this test can be used) for the duration of the COVID-19 declaration under Section 564(b)(1) of the Act, 21 U.S.C. section 360bbb-3(b)(1), unless the authorization is terminated or revoked.  Performed at [facility], [address] [place], [place] [address]   Blood culture (routine x 2)     Status: None (Preliminary result)   Collection Time: 04/19/24 10:04 PM   Specimen: BLOOD  Result Value Ref Range Status   Specimen Description   Final    BLOOD LEFT ANTECUBITAL Performed at [facility], [address] [place], [place] [address]    Special Requests   Final    BOTTLES DRAWN AEROBIC AND ANAEROBIC Blood Culture adequate volume Performed at [facility], [address] [place], [place] [address]    Culture   Final    NO GROWTH 4 DAYS Performed at [facility], [address], [place], [place] [address]    Report Status PENDING  Incomplete  Blood culture (routine x 2)     Status: None (Preliminary result)   Collection Time: 04/19/24 10:30 PM   Specimen: BLOOD LEFT HAND  Result Value Ref Range Status   Specimen Description   Final    BLOOD LEFT HAND Performed at [facility], [address] [place], [place] [address]    Special Requests   Final    BOTTLES DRAWN AEROBIC AND ANAEROBIC Blood  Culture results may not be optimal due to an inadequate volume of blood received in culture bottles Performed at [facility], [address] [place], [place] [address]    Culture   Final    NO GROWTH 4 DAYS Performed at [facility], [address], [place], [place] [address]    Report Status PENDING  Incomplete  Respiratory (~20 pathogens) panel by PCR     Status: None   Collection Time: 04/20/24  8:30 AM   Specimen: Nasopharyngeal Swab; Respiratory  Result Value Ref Range Status   Adenovirus NOT DETECTED NOT DETECTED Final   Coronavirus 229E NOT DETECTED NOT DETECTED Final    Comment: (NOTE) The Coronavirus on the Respiratory Panel, DOES NOT test for the novel  Coronavirus (2019 nCoV)    Coronavirus HKU1 NOT DETECTED NOT DETECTED Final   Coronavirus NL63 NOT DETECTED NOT DETECTED Final   Coronavirus OC43 NOT DETECTED NOT DETECTED Final   Metapneumovirus NOT DETECTED NOT DETECTED Final   Rhinovirus / Enterovirus NOT DETECTED NOT DETECTED Final   Influenza A NOT DETECTED NOT DETECTED Final   Influenza B NOT DETECTED NOT DETECTED Final   Parainfluenza Virus 1 NOT DETECTED NOT DETECTED Final   Parainfluenza Virus 2 NOT DETECTED NOT DETECTED Final   Parainfluenza Virus 3 NOT DETECTED NOT DETECTED Final   Parainfluenza Virus 4 NOT DETECTED NOT DETECTED Final   Respiratory Syncytial Virus NOT DETECTED NOT DETECTED Final   Bordetella pertussis NOT DETECTED NOT DETECTED Final   Bordetella Parapertussis NOT DETECTED NOT DETECTED Final   Chlamydophila pneumoniae NOT DETECTED NOT DETECTED Final   Mycoplasma pneumoniae NOT DETECTED NOT DETECTED Final    Comment: Performed at [facility], [address], [place], [place] [address]     Corean Fireman, MSN, NP-C [facility]  [email or web address] Pager: [phone] Office: [phone] [facility] Main Line: [phone] *Secure Chat Communication  Welcome  Total Encounter Time: 20 min

## 2024-04-24 NOTE — TOC Initial Note (Signed)
 Transition of Care [facility]) - Initial/Assessment Note    Patient Details  Name: Juan [name] MRN: [id number] Date of Birth: 1965/09/17  [facility] CM/SW Contact:    Sudie Erminio Deems, RN Phone Number: 04/24/2024, 3:44 PM  Clinical Narrative:  Risk for readmission assessment completed. Patient presented for fever. Patient has been previously active with [facility] for IV antibiotic therapy. Case Manager notified the [facility] Fees Coordinator regarding this admission. Awaiting call back from the Fees Coordinator. ID is following the patient-currently on IV Rocephin . Case Manager will continue to follow for additional transition of care needs as the patient progresses.                 Expected Discharge Plan: Home w Home Health Services Barriers to Discharge: Continued Medical Work up   Patient Goals and CMS Choice Patient states their goals for this hospitalization and ongoing recovery are:: plans to transition home once stable.   Expected Discharge Plan and Services In-house Referral: NA Discharge Planning Services: CM Consult Post Acute Care Choice: Home Health Living arrangements for the past 2 months: Single Family Home  Prior Living Arrangements/Services Living arrangements for the past 2 months: Single Family Home Lives with:: Spouse Patient language and need for interpreter reviewed:: Yes        Need for Family Participation in Patient Care: No (Comment) Care giver support system in place?: No (comment)   Criminal Activity/Legal Involvement Pertinent to Current Situation/Hospitalization: No - Comment as needed  Activities of Daily Living   ADL Screening (condition at time of admission) Independently performs ADLs?: Yes (appropriate for developmental age) Is the patient deaf or have difficulty hearing?: No Does the patient have difficulty seeing, even when wearing glasses/contacts?: No Does the patient have difficulty concentrating, remembering,  or making decisions?: No  Permission Sought/Granted Permission sought to share information with : Family Supports, Case Manager      Psych Involvement: No (comment)  Admission diagnosis:  Murmur [R01.1] Pneumonia [J18.9] Pneumonia due to infectious organism, unspecified laterality, unspecified part of lung [J18.9] Patient Active Problem List   Diagnosis Date Noted   Chronic biliary pancreatitis (HCC) 04/22/2024   Bacterial endocarditis 04/21/2024   Pneumonia 04/20/2024   SIRS (systemic inflammatory response syndrome) (HCC) 04/20/2024   Abnormal chest x-ray 04/20/2024   Cholelithiasis 04/20/2024   Endocarditis, valve unspecified 03/20/2024   Intertrigo 03/20/2024   Acute bacterial endocarditis 03/07/2024   Abnormal finding on GI tract imaging 03/03/2024   Gram-positive bacteremia 02/26/2024   Sepsis (HCC) 02/15/2024   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) 01/23/2024   Right wrist pain 01/23/2024   Alcoholic polyneuropathy (HCC) 93/97/7974   Moderate aortic stenosis 11/17/2023   Obesity (BMI 30.0-34.9) 11/17/2023   Alcohol addiction (HCC)    Cirrhosis (HCC)    Colon polyps    Depression    Hemochromatosis    Hx of blood clots    Hyperreflexia    Hypertension    Traumatic hemorrhagic shock (HCC)    Right wrist injury 10/18/2023   AKI (acute kidney injury) (HCC) 09/11/2023   Febrile illness 06/27/2023   Bipolar 2 disorder, major depressive episode (HCC) 05/23/2023   Alcohol use disorder 05/23/2023   Arthritis 05/23/2023   Pain of left calf 04/11/2023   Chest trauma 03/18/2023   Acute pain of right knee 03/10/2023   Eustachian tube dysfunction 03/02/2023   Breast nodule 01/12/2023   Post-traumatic osteoarthritis of right knee 01/04/2023   Acute bacterial sinusitis 11/15/2022   Rib  pain on right side 10/31/2022   Head injury 10/31/2022   Bilateral lower extremity edema 10/12/2022   Epistaxis 09/14/2022   Anasarca 09/14/2022   Hematemesis  09/13/2022   Lower extremity edema 09/06/2022   Hyperbilirubinemia 08/17/2022   Anxiety 08/17/2022   GI bleed 08/16/2022   Lumbar degenerative disc disease 07/26/2022   Prolonged QT interval 05/05/2022   Anemia, chronic disease 05/05/2022   Hyponatremia 03/02/2022   GERD (gastroesophageal reflux disease) 03/02/2022   Normocytic anemia 03/02/2022   Tobacco chew use 03/02/2022   Hiatal hernia 02/20/2022   Alcoholic cirrhosis of liver with ascites (HCC) 02/17/2022   DDD (degenerative disc disease), cervical 01/22/2022   Hypokalemia 01/11/2022   Hypomagnesemia 01/11/2022   Malnutrition of moderate degree 01/11/2022   Nonrheumatic aortic valve stenosis    Shoulder pain, left, posterior 12/22/2021   Portal hypertensive gastropathy (HCC)    Pancytopenia (HCC) 09/18/2021   Tinea pedis 05/12/2021   Diverticulosis 04/20/2021   Right lower quadrant pain 04/17/2021   Tibialis posterior tendinitis, right 04/14/2021   Dizziness 03/23/2021   Urinary frequency 03/23/2021   Right ankle sprain 02/12/2021   Well adult exam 01/06/2021   Systolic murmur 01/06/2021   Thrombocytopenia (HCC) 11/02/2020   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 07/11/2020   Allergic reaction 07/08/2020   History of alcohol abuse 05/20/2020   Impingement syndrome, shoulder, right 05/20/2020   Fracture of laryngeal cartilage (HCC) 01/17/2020   Esophagitis, [place] grade D 12/05/2019   PTSD (post-traumatic stress disorder) 04/16/2019   MDD (major depressive disorder), recurrent severe, 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   History of bilateral inguinal hernia repair 01/19/2019   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   Laceration of extensor hallucis longus tendon, left, initial encounter 02/06/2017   Chronic fatigue 12/21/2016   Benign essential hypertension 12/21/2016   PCP:  Alvia Bring, DO Pharmacy:   [facility] [place] [facility] [address] [place]  [place] [address] Phone: [phone] Fax: [phone]     Social Drivers of Health (SDOH) Social History: SDOH Screenings   Food Insecurity: No Food Insecurity (04/20/2024)  Housing: Low Risk  (04/20/2024)  Transportation Needs: No Transportation Needs (04/20/2024)  Utilities: Not At Risk (04/20/2024)  Alcohol Screen: Low Risk  (05/22/2023)  Depression (PHQ2-9): Low Risk  (04/03/2024)  Financial Resource Strain: Low Risk  (10/18/2023)  Physical Activity: Insufficiently Active (10/18/2023)  Social Connections: Unknown (10/18/2023)  Stress: Stress Concern Present (01/09/2023)  Tobacco Use: High Risk (04/19/2024)   Readmission Risk Interventions    04/24/2024    3:44 PM 03/06/2024   11:20 AM 09/15/2022    9:29 AM  Readmission Risk Prevention Plan  Transportation Screening Complete Complete Complete  PCP or Specialist Appt within 3-5 Days  Complete Complete  HRI or Home Care Consult Complete Complete Complete  Social Work Consult for Recovery Care Planning/Counseling  Complete Complete  Palliative Care Screening Not Applicable Not Applicable Not Applicable  Medication Review (RN Care Manager) Referral to Pharmacy Referral to Pharmacy Complete

## 2024-04-24 NOTE — Progress Notes (Signed)
 I have reviewed the chart and was by the bedside to clinically evaluate the patient.  He clinically is stable on antibiotics with negative blood culture from 04/19/2024. His clinical picture supports pneumonia.   Therefore with hx of cirrhosis, portal hypotension and suspicion that esophageal varices can not be rule out. The risk of performing the procedure far outweighs the benefit.   I will notify the primary team.

## 2024-04-24 NOTE — Plan of Care (Signed)
  Problem: Education: Goal: Knowledge of General Education information will improve Description: Including pain rating scale, medication(s)/side effects and non-pharmacologic comfort measures Outcome: Progressing   Problem: Health Behavior/Discharge Planning: Goal: Ability to manage health-related needs will improve Outcome: Progressing   Problem: Clinical Measurements: Goal: Ability to maintain clinical measurements within normal limits will improve Outcome: Progressing Goal: Diagnostic test results will improve Outcome: Progressing Goal: Respiratory complications will improve Outcome: Progressing Goal: Cardiovascular complication will be avoided Outcome: Progressing   Problem: Nutrition: Goal: Adequate nutrition will be maintained Outcome: Progressing   Problem: Coping: Goal: Level of anxiety will decrease Outcome: Progressing   Problem: Elimination: Goal: Will not experience complications related to urinary retention Outcome: Progressing   Problem: Pain Managment: Goal: General experience of comfort will improve and/or be controlled Outcome: Progressing   Problem: Safety: Goal: Ability to remain free from injury will improve Outcome: Progressing   Problem: Skin Integrity: Goal: Risk for impaired skin integrity will decrease Outcome: Progressing

## 2024-04-25 ENCOUNTER — Other Ambulatory Visit (HOSPITAL_COMMUNITY): Payer: Self-pay

## 2024-04-25 DIAGNOSIS — D696 Thrombocytopenia, unspecified: Secondary | ICD-10-CM

## 2024-04-25 LAB — BASIC METABOLIC PANEL WITH GFR
Anion gap: 9 (ref 5–15)
BUN: 14 mg/dL (ref 6–20)
CO2: 23 mmol/L (ref 22–32)
Calcium: 8.2 mg/dL — ABNORMAL LOW (ref 8.9–10.3)
Chloride: 101 mmol/L (ref 98–111)
Creatinine, Ser: 0.85 mg/dL (ref 0.61–1.24)
GFR, Estimated: >60 mL/min (ref >60)
Glucose, Bld: 80 mg/dL (ref 70–99)
Potassium: 3.6 mmol/L (ref 3.5–5.1)
Sodium: 133 mmol/L — ABNORMAL LOW (ref 135–145)

## 2024-04-25 LAB — CBC
HCT: 28.6 % — ABNORMAL LOW (ref 39.0–52.0)
Hemoglobin: 9.7 g/dL — ABNORMAL LOW (ref 13.0–17.0)
MCH: 31.2 pg (ref 26.0–34.0)
MCHC: 33.9 g/dL (ref 30.0–36.0)
MCV: 92 fL (ref 80.0–100.0)
Platelets: 77 K/uL — ABNORMAL LOW (ref 150–400)
RBC: 3.11 MIL/uL — ABNORMAL LOW (ref 4.22–5.81)
RDW: 19.9 % — ABNORMAL HIGH (ref 11.5–15.5)
WBC: 6.9 K/uL (ref 4.0–10.5)
nRBC: 0 % (ref 0.0–0.2)

## 2024-04-25 LAB — CULTURE, BLOOD (ROUTINE X 2)
Culture: NO GROWTH
Culture: NO GROWTH
Special Requests: ADEQUATE

## 2024-04-25 LAB — MAGNESIUM: Magnesium: 1.6 mg/dL — ABNORMAL LOW (ref 1.7–2.4)

## 2024-04-25 LAB — PHOSPHORUS: Phosphorus: 3.7 mg/dL (ref 2.5–4.6)

## 2024-04-25 MED ORDER — MAGNESIUM OXIDE -MG SUPPLEMENT 400 (240 MG) MG PO TABS
400.0000 mg | ORAL_TABLET | Freq: Every day | ORAL | Status: DC
  Administered 2024-04-25: 400 mg via ORAL
  Filled 2024-04-25: qty 1

## 2024-04-25 MED ORDER — MAGNESIUM OXIDE -MG SUPPLEMENT 400 (240 MG) MG PO TABS
400.0000 mg | ORAL_TABLET | Freq: Every day | ORAL | 0 refills | Status: DC
  Filled 2024-04-25: qty 7, 7d supply, fill #0

## 2024-04-25 MED ORDER — VANCOMYCIN HCL 125 MG PO CAPS
125.0000 mg | ORAL_CAPSULE | Freq: Two times a day (BID) | ORAL | Status: DC
  Administered 2024-04-25: 125 mg via ORAL
  Filled 2024-04-25 (×2): qty 1

## 2024-04-25 NOTE — Progress Notes (Signed)
 DISCHARGE NOTE HOME Juan [name] to be discharged Home per MD order. Discussed prescriptions and follow up appointments with the patient. Prescriptions given to patient; medication list explained in detail. Patient verbalized understanding.  Skin clean, dry and intact without evidence of skin break down, no evidence of skin tears noted. IV catheter discontinued intact. Site without signs and symptoms of complications. Dressing and pressure applied. Pt denies pain at the site currently. No complaints noted.  Patient free of lines, drains, and wounds.   An After Visit Summary (AVS) was printed and given to the patient. Patient escorted via wheelchair, and discharged home via private auto.  Michel Hendon K Latissa Frick, RN

## 2024-04-25 NOTE — Plan of Care (Signed)
  Problem: Education: Goal: Knowledge of General Education information will improve Description Including pain rating scale, medication(s)/side effects and non-pharmacologic comfort measures Outcome: Progressing   Problem: Health Behavior/Discharge Planning: Goal: Ability to manage health-related needs will improve Outcome: Progressing   Problem: Clinical Measurements: Goal: Ability to maintain clinical measurements within normal limits will improve Outcome: Progressing Goal: Will remain free from infection Outcome: Progressing Goal: Diagnostic test results will improve Outcome: Progressing   Problem: Activity: Goal: Risk for activity intolerance will decrease Outcome: Progressing   Problem: Nutrition: Goal: Adequate nutrition will be maintained Outcome: Progressing   Problem: Safety: Goal: Ability to remain free from injury will improve Outcome: Progressing   

## 2024-04-25 NOTE — Evaluation (Signed)
 Physical Therapy Brief Evaluation and Discharge Note Patient Details Name: Juan [name] MRN: [id number] DOB: 1966/08/10 Today's Date: 04/25/2024   History of Present Illness  58 y.o. male presents to ED 8/28 with high fever. Chest x-ray showing streaky lower lobe opacities due to atelectasis or mild infiltrate. Admitted for treatment of PNA PMH: hospitalized 7/12-7/18 for positive for streptococcus mitis/oralis, hypertension, cirrhosis with portal hypertension, alcohol abuse, C. difficile, GI bleed, PTSD, bipolar disorder, and GERD  Clinical Impression  PTA pt living with wife in a single story town house with level entry. Pt independent with RW in home and uses electric wheelchair for community level ambulation. Pt is currently limited in safe mobility by generalized weakness, and decrease L foot dorsiflexion in swing phase, which with shoes donned causes stumbles which pt is able to self correct. PT educated on need for increased focus on increased L foot elevation to reduce risk for tripping.  Pt is working with Network engineer and does not need any further acute PT but will benefit from return to OPPT. Pt reports that his OPPT has closed since his recent hospitalization and will need referral to another facility.      PT Assessment All further PT needs can be met in the next venue of care  Assistance Needed at Discharge  PRN    Equipment Recommendations None recommended by PT     Precautions/Restrictions Precautions Precautions: Fall Restrictions Weight Bearing Restrictions Per Provider Order: No        Mobility  Bed Mobility       General bed mobility comments: sitting on EoB on entry  Transfers Overall transfer level: Modified independent Equipment used: Rolling walker (2 wheels)               General transfer comment: good power up and self steadying in RW    Ambulation/Gait Ambulation/Gait assistance: Supervision Gait Distance (Feet): 350  Feet Assistive device: Rolling walker (2 wheels) Gait Pattern/deviations: Decreased dorsiflexion - left, Step-through pattern Gait Speed: Pace WFL General Gait Details: Pt with long history of L foot drop. Pt donned shoes for ambulation for the first time since being admitted and had been walking in stocking feet. Pt with 3x catch of L toe with ambulation in hallway. VC for concentration on ambulation and increased L foot lift for swing through phase.  Home Activity Instructions Home Activity Instructions: return to prior HEP in which he was practicing toe taps to a cup to work on increased L hip and knee flexion       Balance Overall balance assessment: Mild deficits observed, not formally tested   Sitting balance-Leahy Scale: Normal       Standing balance-Leahy Scale: Normal Standing balance comment: mild deficits with catching L toe          Pertinent Vitals/Pain PT - Brief Vital Signs All Vital Signs Stable: Yes Pain Assessment Pain Assessment: No/denies pain     Home Living Family/patient expects to be discharged to:: Private residence Living Arrangements: Spouse/significant other Available Help at Discharge: Family;Available PRN/intermittently Home Environment: Level entry   Home Equipment: Rolling Walker (2 wheels);Rollator (4 wheels);Wheelchair - power;Hand held shower head;Shower seat        Prior Function Level of Independence: Independent with assistive device(s) Comments: uses Rollator in the home and electric wheelchair for longer community distances    UE/LE Assessment   UE ROM/Strength/Tone/Coordination: WFL    LE ROM/Strength/Tone/Coordination: Impaired LE ROM/Strength/Tone/Coordination Deficits: L foot lacking full active dorsiflexion    Communication  Communication Communication: No apparent difficulties     Cognition Overall Cognitive Status: Appears within functional limits for tasks assessed/performed       General Comments  General comments (skin integrity, edema, etc.): mild edema in B LE, pt on lasix         Assessment/Plan    PT Problem List Decreased strength;Decreased range of motion       PT Visit Diagnosis Unsteadiness on feet (R26.81);Difficulty in walking, not elsewhere classified (R26.2)    No Skilled PT     Co-evaluation                AMPAC 6 Clicks Help needed turning from your back to your side while in a flat bed without using bedrails?: None Help needed moving from lying on your back to sitting on the side of a flat bed without using bedrails?: None Help needed moving to and from a bed to a chair (including a wheelchair)?: None Help needed standing up from a chair using your arms (e.g., wheelchair or bedside chair)?: None Help needed to walk in hospital room?: None Help needed climbing 3-5 steps with a railing? : A Little 6 Click Score: 23      End of Session   Activity Tolerance: Patient tolerated treatment well Patient left: in bed;with call bell/phone within reach Nurse Communication: Mobility status PT Visit Diagnosis: Unsteadiness on feet (R26.81);Difficulty in walking, not elsewhere classified (R26.2)     Time: 9076-9058 PT Time Calculation (min) (ACUTE ONLY): 18 min  Charges:   PT Evaluation $PT Eval Low Complexity: 1 Low      Hope Brandenburger B. Fleeta Lapidus PT, DPT Acute Rehabilitation Services Please use secure chat or  Call Office [phone]   Almarie KATHEE Fleeta Fleet  04/25/2024, 10:03 AM

## 2024-04-25 NOTE — Progress Notes (Signed)
 DC order noted per MD. DC RN at bedside. Patient agreeable with POC for discharge. AVS printed/reviewed with the patient. PIV removed. All belongings accounted for. No home meds present. No telemonitor present on assessment. Patient wheeled downstairs to DC lounge awaiting family ride and TOC meds. TOC meds provided to the patient. Patient discharged home via private auto.

## 2024-04-25 NOTE — TOC Transition Note (Signed)
 Transition of Care [facility]) - Discharge Note   Patient Details  Name: Juan [name] MRN: [id number] Date of Birth: 01/12/1966  [facility] CM/SW Contact:  Sudie Erminio Deems, RN Phone Number: 04/25/2024, 11:41 AM   Clinical Narrative: Case Manager spoke with patient and PT recommendations were for Outpatient PT- ambulatory referral submitted to [facility] location [address] Office to call the patient with visit times. Patient states he has transportation to appointments. No further needs identified at this time.     Final next level of care: OP Rehab Barriers to Discharge: No Barriers Identified   Patient Goals and CMS Choice Patient states their goals for this hospitalization and ongoing recovery are:: plans to transition home once stable.   Discharge Plan and Services Additional resources added to the After Visit Summary for   In-house Referral: NA Discharge Planning Services: CM Consult Post Acute Care Choice: Home Health            Social Drivers of Health (SDOH) Interventions SDOH Screenings   Food Insecurity: No Food Insecurity (04/20/2024)  Housing: Low Risk  (04/20/2024)  Transportation Needs: No Transportation Needs (04/20/2024)  Utilities: Not At Risk (04/20/2024)  Alcohol Screen: Low Risk  (05/22/2023)  Depression (PHQ2-9): Low Risk  (04/03/2024)  Financial Resource Strain: Low Risk  (10/18/2023)  Physical Activity: Insufficiently Active (10/18/2023)  Social Connections: Unknown (10/18/2023)  Stress: Stress Concern Present (01/09/2023)  Tobacco Use: High Risk (04/19/2024)     Readmission Risk Interventions    04/24/2024    3:44 PM 03/06/2024   11:20 AM 09/15/2022    9:29 AM  Readmission Risk Prevention Plan  Transportation Screening Complete Complete Complete  PCP or Specialist Appt within 3-5 Days  Complete Complete  HRI or Home Care Consult Complete Complete Complete  Social Work Consult for Recovery Care Planning/Counseling  Complete Complete   Palliative Care Screening Not Applicable Not Applicable Not Applicable  Medication Review (RN Care Manager) Referral to Pharmacy Referral to Pharmacy Complete

## 2024-04-26 NOTE — Discharge Summary (Signed)
 Physician Discharge Summary   Patient: Juan [name] MRN: [id number] DOB: 09-09-1965  Admit date:     04/19/2024  Discharge date: 04/25/2024  Discharge Physician: Elgie Butter   PCP: Alvia Bring, DO   Recommendations at discharge:  Please follow up with Infectious disease as recommended and scheduled.  Please follow up with PCP in one week.    Discharge Diagnoses: Principal Problem:   Bacterial endocarditis Active Problems:   SIRS (systemic inflammatory response syndrome) (HCC)   Abnormal chest x-ray   Normocytic anemia   Prolonged QT interval   Cirrhosis (HCC)   Cholelithiasis   Thrombocytopenia (HCC)   History of alcohol abuse   Fever   Pneumonia   Chronic biliary pancreatitis (HCC)  Resolved Problems:   * No resolved hospital problems. [facility] Course: 58 yrs .old Male with medical history significant of hypertension, cirrhosis with portal hypertension, alcohol abuse, C. difficile, GI bleed, PTSD, bipolar disorder, and GERD presents in the ED  with high fever.  Patient was recently hospitalized from 7/12-7/18 with fever noted to have blood cultures positive for streptococcus mitis/oralis with prior cultures in outside facility positive for strep paradanguinis.  Echocardiogram could not rule out aortic valve vegetation.  Repeat blood cultures were negative.  He was treated with 6 weeks of IV Rocephin  through PICC line which were completed 5 days ago.  Also during hospitalization there was concern for the possibility of gallstone pancreatitis/possible cholecystitis but not a surgical candidate given comorbidities.  He makes note that he was referred to [facility] as it was thought that this may have been the initial source for  the bacteremia.  Patient presented with fever, cough and stomach pain.  In the ED pertinent labs include hemoglobin 9.4, platelet count 75K lactic acid normal, lipase 139 which is improved.  Chest x-ray shows lower lobe opacities due to atelectasis or mild  infiltrate. Patient was admitted for further evaluation and started on empiric antibiotics.    Assessment and Plan:  Suspected pneumonia: Concern for recurrence of endocarditis : Patient presented with fever 101.8 at home with tachypnea.   Patient had just recently been treated with IV antibiotics for 6 weeks for strep species bacteremia thought possibly secondary to endocarditis. Patient had initially been treated with empiric antibiotics ( vancomycin  and cefepmine). Blood cultures >  No growth for 3 days CRP, ESR and procalcitonin unremarkable. 2D echo shows LV EF 70 to 75%, No vegetations noted. ID  recommended to continue ceftriaxone . Patient will likely need TEE. Cardiology is consulted, history of liver cirrhosis , suspicion of  esophagus varices cannot be ruled out therefore the risk of performing the procedure far outweigh the benefits. ID notified. Infectious disease recommended stopping the antibiotics, monitor off antibiotics and recommend outpatient follow upw ith ID. Discussed with the patient, and recommended to reach out to ID or come to ED if he has any fevers at this time.  Currently he doesn't have any signs and symptoms of myocardial abscess or CHF or embolic phenomenon.  Though he was given empiric antibiotics but currently he has been afebril,e wbc count wnl. And his blood cultures have been negative.  Continue vancomycin  for C. difficile prevention to complete the course on discharge. .   Abnormal chest x-ray: Chest x-ray showing streaky lower lobe opacities due to atelectasis or mild infiltrate.   COVID, influenza, and RSV screening were negative.   Procalcitonin unremarkable. Respiratory viral panel negative.   Normocytic anemia: Acute on chronic.   Hemoglobin noted to be  9.4, but had previously been 11.2 when checked 2 weeks ago.   Baseline hemoglobin appears to be around 7-9 g/dL. No signs of any obvious bleeding.  Monitor H&H.   Prolonged QT interval: QTc  noted to be prolonged at 503.   Recheck EKG shows improved QT interval. - Avoid QT prolonging medications   History of liver cirrhosis: Portal hypertension Patient noted to have  lower extremity swelling.   History of decompensated cirrhosis. He had not been taking Lasix  or spironolactone .    Patient notes that spironolactone  had been discontinued due to gynecomastia.  Following with transplant clinic at [facility].  EGD done on 7/15 did not show any esophageal varices, but showed portal hypertensive gastropathy.  Resume lasix .    Cholelithiasis: Patient with prior history of cholelithiasis with concern for cholecystitis versus gallstone pancreatitis during last hospitalization.  Patient was referred to [facility] as this was thought to be a possible source for his bacteremia.  Patient denies any complaints of abdominal pain at this time.Continue outpatient follow-up with GI at [facility]. CT A/P findings suggestive of acute pancreatitis,  cystic structure in the pancreatic head.  Cholelithiasis, morphological changes of cirrhosis.   Thrombocytopenia: Chronic.  Platelet count 75 which appears around patient's baseline.  Thought secondary to history of cirrhosis. - Continue to monitor   History alcohol abuse: Patient currently is not drinking alcohol. - Encouraged continued cessation of alcohol use.       Consultants: ID Procedures performed: ECHO  Disposition: Home Diet recommendation:  Discharge Diet Orders (From admission, onward)     Start     Ordered   04/25/24 0000  Diet - low sodium heart healthy        04/25/24 1112           Regular diet DISCHARGE MEDICATION: Allergies as of 04/25/2024       Reactions   Cucumber Extract Itching, Nausea And Vomiting   Depakote Er [divalproex Sodium Er] Swelling   Tongue swelling   Depakote [valproic Acid] Anaphylaxis   Peanut (diagnostic) Anaphylaxis, Swelling   Reaction to peanut butter, specifically   Peanut Oil Swelling   Shellfish  Allergy Itching, Swelling   Shrimp, specifically   Codeine Itching, Rash   OK to take hydrocodone /oxycodone  with no issues   Firvanq  [vancomycin ] Itching, Other (See Comments)   Redman syndrome after receiving via IV (unknown date, a few years ago) Patient complains of severe itching after being switched to IV formulation on 04/19/24 OK to take tablet formulation, no issues.   Lactose Intolerance (gi) Diarrhea, Other (See Comments)   Bloating, flatulence Indigestion Stomach pain   Tylenol  [acetaminophen ] Other (See Comments)   Hx of cirrhosis   Cantaloupe (diagnostic) Rash        Medication List     TAKE these medications    acetaminophen  500 MG tablet Commonly known as: TYLENOL  Take 500 mg by mouth daily as needed for moderate pain (pain score 4-6), fever or headache.   Clotrimazole  Anti-Fungal 1 % cream Generic drug: clotrimazole  Apply 1 Application topically 2 (two) times daily. What changed:  when to take this reasons to take this   FeroSul 325 (65 Fe) MG tablet Generic drug: ferrous sulfate  Take 1 tablet (325 mg total) by mouth daily with breakfast.   fluticasone  50 MCG/ACT nasal spray Commonly known as: FLONASE  Place 1-2 sprays into both nostrils daily as needed for allergies.   furosemide  40 MG tablet Commonly known as: LASIX  Take 40 mg by mouth daily as needed (  excessive swelling, weight gain).   hydrOXYzine  10 MG tablet Commonly known as: ATARAX  Take 1 tablet (10 mg total) by mouth every 8 (eight) hours as needed for itching (1- 2 pills for itching).   magnesium  oxide 400 (240 Mg) MG tablet Commonly known as: MAG-OX Take 1 tablet (400 mg total) by mouth daily.   Multivitamin Men Tabs Take 1 tablet by mouth daily.   nystatin  powder Apply 1 Application topically 2 (two) times daily as needed (skin irritation).   ondansetron  4 MG disintegrating tablet Commonly known as: ZOFRAN -ODT Take 1 tablet (4 mg total) by mouth every 8 (eight) hours as  needed.   pantoprazole  40 MG tablet Commonly known as: PROTONIX  Take 1 tablet (40 mg total) by mouth daily. What changed:  when to take this reasons to take this   QUEtiapine  50 MG tablet Commonly known as: SEROquel  Take 0.5-1 tablets (25-50 mg total) by mouth at bedtime. What changed: how much to take   spironolactone  100 MG tablet Commonly known as: ALDACTONE  Take 1 tablet (100 mg total) by mouth daily.   vancomycin  125 MG capsule Commonly known as: VANCOCIN  Take 1 capsule (125 mg total) by mouth in the morning and at bedtime.   VITAMIN B-12 PO Take 1 tablet by mouth daily.        Follow-up Information     Alvia Bring, DO. Schedule an appointment as soon as possible for a visit in 1 week(s).   Specialty: Family Medicine Contact information: [address]  [address] [place] [address] [phone]         [facility] Follow up.   Specialty: Rehabilitation Why: Office will call the patient to schedule. If the office has not called within 3-5 bsuiness days please call them. Contact information: [address]  [address] [place]  [address] [phone]               Discharge Exam: Fredricka Weights   04/19/24 2035 04/20/24 0440  Weight: 87.9 kg 86 kg   General exam: Appears calm and comfortable  Respiratory system: Clear to auscultation. Respiratory effort normal. Cardiovascular system: S1 & S2 heard, RRR.  Gastrointestinal system: Abdomen is nondistended, soft and nontender.  Central nervous system: Alert and oriented.  Extremities: Symmetric 5 x 5 power. Skin: No rashes,  Psychiatry: Mood & affect appropriate.    Condition at discharge: fair  The results of significant diagnostics from this hospitalization (including imaging, microbiology, ancillary and laboratory) are listed below for reference.   Imaging Studies: CT ABDOMEN PELVIS W CONTRAST Result  Date: 04/21/2024 CLINICAL DATA:  Cholecystitis. EXAM: CT ABDOMEN AND PELVIS WITH CONTRAST TECHNIQUE: Multidetector CT imaging of the abdomen and pelvis was performed using the standard protocol following bolus administration of intravenous contrast. RADIATION DOSE REDUCTION: This exam was performed according to the departmental dose-optimization program which includes automated exposure control, adjustment of the mA and/or kV according to patient size and/or use of iterative reconstruction technique. CONTRAST:  75mL OMNIPAQUE  IOHEXOL  350 MG/ML SOLN COMPARISON:  03/03/2024. FINDINGS: Lower chest: Scattered coronary artery calcifications are noted. No acute abnormality. Hepatobiliary: The liver has a nodular contour, compatible with underlying cirrhosis. A 7 mm hypodensity is present in the posterior right lobe of the liver, unchanged from the prior exam. Stones are noted within the gallbladder. No biliary ductal dilatation is seen. Pancreas: A cystic region is present in the pancreas, the largest measuring 11 mm in the pancreatic  head, previously characterized by MRI. No pancreatic ductal dilatation is seen. There is mild peripancreatic fat stranding and edema. Spleen: Normal in size without focal abnormality. Adrenals/Urinary Tract: The adrenal glands are within normal limits. The kidneys enhance symmetrically. A small calculus is noted in the lower pole of the left kidney. No hydronephrosis bilaterally. The bladder is unremarkable. Stomach/Bowel: There is a small hiatal hernia. The stomach is otherwise within normal limits. No bowel obstruction, free air, or pneumatosis is seen. Appendix appears normal. A moderate amount of retained stools present in the colon. Vascular/Lymphatic: Aortic atherosclerosis. 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 Nonspecific prominent lymph nodes are present in the gastrohepatic ligament and porta hepatis, likely reactive.  Reproductive: Prostate is unremarkable. Other: No ascites.  A fat containing umbilical hernia is noted. Musculoskeletal: No acute osseous abnormality. IMPRESSION: 1. Findings suggestive of acute pancreatitis. 2. 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 3. Morphologic changes of cirrhosis and portal hypertension. 4. Cholelithiasis. 5. Small hiatal hernia. 6. Nonobstructive left renal calculus. 7. Aortic atherosclerosis. Electronically Signed   By: Leita Birmingham M.D.   On: 04/21/2024 18:37   ECHOCARDIOGRAM LIMITED Result Date: 04/21/2024    ECHOCARDIOGRAM LIMITED REPORT   Patient Name:   DIONTE BLAUSTEIN Date of Exam: 04/21/2024 Medical Rec #:  [id number]       Height:       65.0 in Accession #:    [id number]      Weight:       189.5 lb Date of Birth:  May 07, 1966       BSA:          1.933 m Patient Age:    58 years        BP:           123/77 mmHg Patient Gender: M               HR:           76 bpm. Exam Location:  Inpatient Procedure: Limited Echo, Color Doppler and Cardiac Doppler (Both Spectral and            Color Flow Doppler were utilized during procedure). Indications:    Endocarditis i38  History:        Patient has prior history of Echocardiogram examinations, most                 recent 03/12/2024. Risk Factors:Hypertension and ETOH.  Sonographer:    Damien Senior RDCS Referring Phys: [phone] RONDELL A SMITH  Sonographer Comments: Limited post 6 weeks antibiotics IMPRESSIONS  1. Left ventricular ejection fraction, by estimation, is 70 to 75%. The left ventricle has hyperdynamic function.  2. Right ventricular systolic function is normal.  3. The mitral valve is grossly normal. Trivial mitral valve regurgitation.  4. Aortic valve gradients have increased from a mean of to . This is partially related to dynamic function, though DVI is similar. Highest gradient was in the three chamber view and is best optimized in this study. The aortic  valve is abnormal. There is severe calcifcation of the aortic valve. There is severe thickening of the aortic valve. Aortic valve regurgitation is trivial. Moderate aortic valve stenosis. Aortic valve area, by VTI measures 1.55 cm. Aortic valve mean gradient measures 28.0 mmHg. Aortic valve Vmax measures 3.40 m/s. Comparison(s): Prior images reviewed side by side. DVI is a sub-optimal comparator of AS signal in both studies as  LVOT VTI aliasing occurs in both. Overall, slightly higher signal of aortic valve with calcified aortic valve and increased function. There  is no clear evidence of a vegetation on this study. Conclusion(s)/Recommendation(s): No evidence of valvular vegetations on this transthoracic echocardiogram. Consider a transesophageal echocardiogram to exclude infective endocarditis if clinically indicated. FINDINGS  Left Ventricle: Left ventricular ejection fraction, by estimation, is 70 to 75%. The left ventricle has hyperdynamic function. Right Ventricle: Right ventricular systolic function is normal. Mitral Valve: The mitral valve is grossly normal. Mild mitral annular calcification. Trivial mitral valve regurgitation. Tricuspid Valve: The tricuspid valve is normal in structure. Tricuspid valve regurgitation is trivial. Aortic Valve: Aortic valve gradients have increased from a mean of to . This is partially related to dynamic function, though DVI is similar. Highest gradient was in the three chamber view and is best optimized in this study. The aortic valve is abnormal. There is severe calcifcation of the aortic valve. There is severe thickening of the aortic valve. Aortic valve regurgitation is trivial. Moderate aortic stenosis is present. Aortic valve mean gradient measures 28.0 mmHg. Aortic valve peak gradient measures 46.2 mmHg. Aortic valve area, by VTI measures 1.55 cm. Pulmonic Valve: The pulmonic valve was thickened with good excursion. Pulmonic valve regurgitation is  trivial. No evidence of pulmonic stenosis. Additional Comments: Spectral Doppler performed. Color Doppler performed.  LEFT VENTRICLE PLAX 2D LVOT diam:     2.00 cm LV SV:         92 LV SV Index:   48 LVOT Area:     3.14 cm  AORTIC VALVE AV Area (Vmax):    1.41 cm AV Area (Vmean):   1.52 cm AV Area (VTI):     1.55 cm AV Vmax:           340.00 cm/s AV Vmean:          246.000 cm/s AV VTI:            0.594 m AV Peak Grad:      46.2 mmHg AV Mean Grad:      28.0 mmHg LVOT Vmax:         153.00 cm/s LVOT Vmean:        119.000 cm/s LVOT VTI:          0.293 m LVOT/AV VTI ratio: 0.49  SHUNTS Systemic VTI:  0.29 m Systemic Diam: 2.00 cm Stanly Leavens MD Electronically signed by Stanly Leavens MD Signature Date/Time: 04/21/2024/1:54:09 PM    Final    DG Chest 2 View Result Date: 04/19/2024 CLINICAL DATA:  Cough EXAM: CHEST - 2 VIEW COMPARISON:  02/20/2024 FINDINGS: Streaky lower lobe opacities due to atelectasis or mild infiltrate. No pleural effusion. Normal cardiac size. No pneumothorax IMPRESSION: Streaky lower lobe opacities due to atelectasis or mild infiltrate. Electronically Signed   By: Luke Bun M.D.   On: 04/19/2024 21:00   MM 3D DIAGNOSTIC MAMMOGRAM BILATERAL BREAST Result Date: 04/18/2024 CLINICAL DATA:  58 year old gentleman with BILATERAL retroareolar breast pain for 2 weeks. EXAM: DIGITAL DIAGNOSTIC BILATERAL MAMMOGRAM WITH TOMOSYNTHESIS AND CAD TECHNIQUE: Bilateral digital diagnostic mammography and breast tomosynthesis was performed. The images were evaluated with computer-aided detection. COMPARISON:  Previous exam(s). ACR Breast Density Category b: There are scattered areas of fibroglandular density. FINDINGS: RIGHT: Physical examination: Focused examination of the retroareolar region demonstrates mild firm mobile tissue. Mammogram: Flame shaped asymmetry in the retroareolar RIGHT breast is consistent with gynecomastia. No suspicious mass, distortion, or microcalcifications are  identified to suggest presence  of malignancy. LEFT: Physical examination: Focused examination of the LEFT retroareolar region demonstrates mild firm mobile tissue. Mammogram: Flame shaped asymmetry in the retroareolar LEFT breast is consistent with gynecomastia. No suspicious mass, distortion, or microcalcifications are identified to suggest presence of malignancy. IMPRESSION: 1. No evidence of RIGHT or LEFT breast malignancy. 2. BILATERAL gynecomastia. RECOMMENDATION: No further mammographic workup is recommended, unless the patient develops new symptoms or signs. Gynecomastia is common and can occur with changes in the testosterone :estrogen ratio. Potential causes of gynecomastia include numerous prescription medications, dietary supplements, anabolic steroids, and recreational drugs, particularly marijuana. Other causes include hormone secreting testicular or pituitary tumors. Gynecomastia can be related to other medical problems, such as kidney, thyroid , or liver disease. Gynecomastia often resolves on its own. I have discussed the findings and recommendations with the patient. If applicable, a reminder letter will be sent to the patient regarding the next appointment. BI-RADS CATEGORY  2: Benign. Electronically Signed   By: Aliene Lloyd M.D.   On: 04/18/2024 10:43    Microbiology: Results for orders placed or performed during the hospital encounter of 04/19/24  Resp panel by RT-PCR (RSV, Flu A&B, Covid) Anterior Nasal Swab     Status: None   Collection Time: 04/19/24  9:51 PM   Specimen: Anterior Nasal Swab  Result Value Ref Range Status   SARS Coronavirus 2 by RT PCR NEGATIVE NEGATIVE Final    Comment: (NOTE) SARS-CoV-2 target nucleic acids are NOT DETECTED.  The SARS-CoV-2 RNA is generally detectable in upper respiratory specimens during the acute phase of infection. The lowest concentration of SARS-CoV-2 viral copies this assay can detect is 138 copies/mL. A negative result does not preclude  SARS-Cov-2 infection and should not be used as the sole basis for treatment or other patient management decisions. A negative result may occur with  improper specimen collection/handling, submission of specimen other than nasopharyngeal swab, presence of viral mutation(s) within the areas targeted by this assay, and inadequate number of viral copies(<138 copies/mL). A negative result must be combined with clinical observations, patient history, and epidemiological information. The expected result is Negative.  Fact Sheet for Patients:  BloggerCourse.com  Fact Sheet for Healthcare Providers:  SeriousBroker.it  This test is no t yet approved or cleared by the [place]  FDA and  has been authorized for detection and/or diagnosis of SARS-CoV-2 by FDA under an Emergency Use Authorization (EUA). This EUA will remain  in effect (meaning this test can be used) for the duration of the COVID-19 declaration under Section 564(b)(1) of the Act, 21 U.S.C.section 360bbb-3(b)(1), unless the authorization is terminated  or revoked sooner.       Influenza A by PCR NEGATIVE NEGATIVE Final   Influenza B by PCR NEGATIVE NEGATIVE Final    Comment: (NOTE) The Xpert Xpress SARS-CoV-2/FLU/RSV plus assay is intended as an aid in the diagnosis of influenza from Nasopharyngeal swab specimens and should not be used as a sole basis for treatment. Nasal washings and aspirates are unacceptable for Xpert Xpress SARS-CoV-2/FLU/RSV testing.  Fact Sheet for Patients: BloggerCourse.com  Fact Sheet for Healthcare Providers: SeriousBroker.it  This test is not yet approved or cleared by the [place]  FDA and has been authorized for detection and/or diagnosis of SARS-CoV-2 by FDA under an Emergency Use Authorization (EUA). This EUA will remain in effect (meaning this test can be used) for the duration of  the COVID-19 declaration under Section 564(b)(1) of the Act, 21 U.S.C. section 360bbb-3(b)(1), unless the authorization is terminated or revoked.  Resp Syncytial Virus by PCR NEGATIVE NEGATIVE Final    Comment: (NOTE) Fact Sheet for Patients: BloggerCourse.com  Fact Sheet for Healthcare Providers: SeriousBroker.it  This test is not yet approved or cleared by the [place]  FDA and has been authorized for detection and/or diagnosis of SARS-CoV-2 by FDA under an Emergency Use Authorization (EUA). This EUA will remain in effect (meaning this test can be used) for the duration of the COVID-19 declaration under Section 564(b)(1) of the Act, 21 U.S.C. section 360bbb-3(b)(1), unless the authorization is terminated or revoked.  Performed at [facility], [address] [place], [place] [address]   Blood culture (routine x 2)     Status: None   Collection Time: 04/19/24 10:04 PM   Specimen: BLOOD  Result Value Ref Range Status   Specimen Description   Final    BLOOD LEFT ANTECUBITAL Performed at [facility], [address] [place], [place] [address]    Special Requests   Final    BOTTLES DRAWN AEROBIC AND ANAEROBIC Blood Culture adequate volume Performed at [facility], [address] [place], [place] [address]    Culture   Final    NO GROWTH 5 DAYS Performed at [facility], [address], [place], [place] [address]    Report Status 04/25/2024 FINAL  Final  Blood culture (routine x 2)     Status: None   Collection Time: 04/19/24 10:30 PM   Specimen: BLOOD LEFT HAND  Result Value Ref Range Status   Specimen Description   Final    BLOOD LEFT HAND Performed at [facility], [address] [place], [place] [address]    Special Requests   Final    BOTTLES DRAWN AEROBIC AND ANAEROBIC Blood Culture results may not be optimal due to an inadequate volume of blood received  in culture bottles Performed at [facility], [address] [place], [place] [address]    Culture   Final    NO GROWTH 5 DAYS Performed at [facility], [address], [place], [place] [address]    Report Status 04/25/2024 FINAL  Final  Respiratory (~20 pathogens) panel by PCR     Status: None   Collection Time: 04/20/24  8:30 AM   Specimen: Nasopharyngeal Swab; Respiratory  Result Value Ref Range Status   Adenovirus NOT DETECTED NOT DETECTED Final   Coronavirus 229E NOT DETECTED NOT DETECTED Final    Comment: (NOTE) The Coronavirus on the Respiratory Panel, DOES NOT test for the novel  Coronavirus (2019 nCoV)    Coronavirus HKU1 NOT DETECTED NOT DETECTED Final   Coronavirus NL63 NOT DETECTED NOT DETECTED Final   Coronavirus OC43 NOT DETECTED NOT DETECTED Final   Metapneumovirus NOT DETECTED NOT DETECTED Final   Rhinovirus / Enterovirus NOT DETECTED NOT DETECTED Final   Influenza A NOT DETECTED NOT DETECTED Final   Influenza B NOT DETECTED NOT DETECTED Final   Parainfluenza Virus 1 NOT DETECTED NOT DETECTED Final   Parainfluenza Virus 2 NOT DETECTED NOT DETECTED Final   Parainfluenza Virus 3 NOT DETECTED NOT DETECTED Final   Parainfluenza Virus 4 NOT DETECTED NOT DETECTED Final   Respiratory Syncytial Virus NOT DETECTED NOT DETECTED Final   Bordetella pertussis NOT DETECTED NOT DETECTED Final   Bordetella Parapertussis NOT DETECTED NOT DETECTED Final   Chlamydophila pneumoniae NOT DETECTED NOT DETECTED Final   Mycoplasma pneumoniae NOT DETECTED NOT DETECTED Final  Comment: Performed at [facility], [address] [place], [place] [address]    Labs: CBC: Recent Labs  Lab 04/19/24 2100 04/20/24 0820 04/21/24 0435 04/23/24 0356 04/24/24 0527 04/25/24 0435  WBC 6.7  --  6.7 7.2 7.0 6.9  NEUTROABS 3.2  --   --   --   --   --   HGB 9.4* 8.9* 9.8* 9.9* 9.7* 9.7*  HCT 27.9* 26.0* 28.6* 29.9* 29.0* 28.6*  MCV 90.6  --  90.2 92.3 92.4 92.0  PLT  75*  --  71* 82* 81* 77*   Basic Metabolic Panel: Recent Labs  Lab 04/19/24 2100 04/21/24 0435 04/23/24 0356 04/24/24 0527 04/25/24 0435  NA 140 137 136 135 133*  K 4.2 3.3* 3.3* 3.5 3.6  CL 108 104 103 103 101  CO2 24 24 25 25 23   GLUCOSE 107* 91 93 84 80  BUN 16 11 8 12 14   CREATININE 1.05 0.82 0.91 0.88 0.85  CALCIUM  8.4* 8.7* 8.8* 8.3* 8.2*  MG  --   --  1.2* 1.6* 1.6*  PHOS  --   --  4.6 3.8 3.7   Liver Function Tests: Recent Labs  Lab 04/19/24 2100 04/21/24 0435  AST 76* 65*  ALT 26 24  ALKPHOS 90 62  BILITOT 2.4* 5.3*  PROT 5.4* 5.6*  ALBUMIN 2.7* 2.0*   CBG: No results for input(s): GLUCAP in the last 168 hours.  Discharge time spent: 42 MINUTES.   Signed: Amonie Wisser, MD [facility] 04/26/2024

## 2024-05-03 ENCOUNTER — Other Ambulatory Visit (HOSPITAL_BASED_OUTPATIENT_CLINIC_OR_DEPARTMENT_OTHER): Payer: Self-pay

## 2024-05-03 ENCOUNTER — Ambulatory Visit (INDEPENDENT_AMBULATORY_CARE_PROVIDER_SITE_OTHER): Admitting: Family Medicine

## 2024-05-03 VITALS — BP 121/54 | HR 76 | Temp 98.2°F | Resp 20 | Ht 65.0 in | Wt 193.0 lb

## 2024-05-03 DIAGNOSIS — E876 Hypokalemia: Secondary | ICD-10-CM | POA: Diagnosis not present

## 2024-05-03 DIAGNOSIS — R7881 Bacteremia: Secondary | ICD-10-CM

## 2024-05-03 DIAGNOSIS — R9431 Abnormal electrocardiogram [ECG] [EKG]: Secondary | ICD-10-CM

## 2024-05-03 DIAGNOSIS — K8020 Calculus of gallbladder without cholecystitis without obstruction: Secondary | ICD-10-CM

## 2024-05-03 MED ORDER — CICLOPIROX OLAMINE 0.77 % EX CREA
TOPICAL_CREAM | Freq: Two times a day (BID) | CUTANEOUS | 3 refills | Status: AC
  Filled 2024-05-03 (×2): qty 30, 30d supply, fill #0
  Filled 2024-05-10: qty 30, 30d supply, fill #1

## 2024-05-03 NOTE — Progress Notes (Signed)
 Juan [name] - 58 y.o. male MRN [id number]  Date of birth: 10/27/1965  Subjective Chief Complaint  Patient presents with   Hospitalization Follow-up    HPI Juan [name] is a 58 y.o. male here today for follow up visit.   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  Initially febrile with tachypnea.  He recently completed 6-week course of IV.  Blood culture from this admission without growth.  No vegetations noted on TTE.  Concern for cholecystitis versus gallstone pancreatitis during previous hospitalization.  Thought this may be the source of his recurrent bacteremia.  He did have appointment at [facility] yesterday to discuss cholecystectomy which they feel that they can accomplish with robotic assisted laparoscopy.  During his hospitalization all of his medications except for magnesium  and potassium were discontinued due to QT prolongation.  He is having difficulty with sleep since discontinuing Seroquel .  Over-the-counter medications have not been helpful.  ROS:  A comprehensive ROS was completed and negative except as noted per HPI  Allergies  Allergen Reactions   Cucumber Extract Itching and Nausea And Vomiting   Depakote Er [Divalproex Sodium Er] Swelling    Tongue swelling   Depakote [Valproic Acid] Anaphylaxis   Peanut (Diagnostic) Anaphylaxis and Swelling    Reaction to peanut butter, specifically   Peanut Oil Swelling   Shellfish Allergy Itching and Swelling    Shrimp, specifically   Codeine Itching and Rash    OK to take hydrocodone /oxycodone  with no issues   Firvanq  [Vancomycin ] Itching and Other (See Comments)    Redman syndrome after receiving via IV (unknown date, a few years ago) Patient complains of severe itching after being switched to IV formulation on 04/19/24 OK to take tablet formulation, no issues.     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  Chronic GERD with HH  09/2019--EGD--[facility] [place]--showed no varices; gr D esophagitis;  Rx: PPI daily     Eustachian tube dysfunction 03/02/2023   Febrile illness 06/27/2023   Fracture of laryngeal cartilage (HCC) 01/17/2020   Last Assessment &  Plan:   Formatting of this note might be different from the original.  Emergency department follow-up for evaluation of laryngeal trauma.  CT obtained the night of the injury is reviewed independently and shows nondisplaced fracture of the thyroid  cartilage.  In general, his voice is much improved from the night of the trauma.  Denies any difficulty  breathing.  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 07/11/2020   Lower extremity edema 09/06/2022   Lumbar degenerative disc disease 07/26/2022   Malnutrition of moderate degree 01/11/2022   MDD (major depressive disorder), recurrent severe, 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   Nonrheumatic aortic valve stenosis    Normocytic anemia 03/02/2022   Pain of left calf 04/11/2023   Pancytopenia (HCC) 09/18/2021   Portal hypertensive gastropathy (HCC)    Post-traumatic osteoarthritis of right knee 01/04/2023   Prolonged QT interval 05/05/2022   PTSD (post-traumatic stress disorder)    Rib pain on right side 10/31/2022   Right ankle sprain 02/12/2021   Right lower quadrant pain 04/17/2021   Right wrist injury 10/18/2023   Shoulder pain, left, posterior 12/22/2021   Sleep apnea    Substance abuse (HCC)    Systolic murmur 01/06/2021   Thrombocytopenia (HCC) 11/02/2020   Tibialis posterior tendinitis, right 04/14/2021   Tinea pedis 05/12/2021   Tobacco chew use 03/02/2022   Traumatic hemorrhagic shock (HCC)    Ulcer    Urinary frequency 03/23/2021   Well adult exam 01/06/2021    Past Surgical History:  Procedure Laterality Date   BIOPSY  09/24/2021   Procedure: BIOPSY;  Surgeon: Federico Rosario BROCKS, MD;  Location: [facility] ENDOSCOPY;  Service: Gastroenterology;;   ORIN MEDIATE RELEASE Bilateral     COLONOSCOPY WITH PROPOFOL  N/A 09/24/2021   Procedure: COLONOSCOPY WITH PROPOFOL ;  Surgeon: Federico Rosario BROCKS, MD;  Location: [facility] ENDOSCOPY;  Service: Gastroenterology;  Laterality: N/A;   ESOPHAGOGASTRODUODENOSCOPY N/A 03/06/2024   Procedure: EGD (ESOPHAGOGASTRODUODENOSCOPY);  Surgeon: Suzann Inocente HERO, MD;  Location: [facility] ENDOSCOPY;  Service: Gastroenterology;  Laterality: N/A;   ESOPHAGOGASTRODUODENOSCOPY (EGD) WITH PROPOFOL  N/A 09/24/2021   Procedure: ESOPHAGOGASTRODUODENOSCOPY (EGD) WITH PROPOFOL ;  Surgeon: Federico Rosario BROCKS, MD;  Location: [facility] ENDOSCOPY;  Service: Gastroenterology;  Laterality: N/A;   ESOPHAGOGASTRODUODENOSCOPY (EGD) WITH PROPOFOL  N/A 08/18/2022   Procedure: ESOPHAGOGASTRODUODENOSCOPY (EGD) WITH PROPOFOL ;  Surgeon: Abran Norleen SAILOR, MD;  Location: [facility] ENDOSCOPY;  Service: Gastroenterology;  Laterality: N/A;   FLEXIBLE SIGMOIDOSCOPY N/A 08/18/2022   Procedure: FLEXIBLE SIGMOIDOSCOPY;  Surgeon: Abran Norleen SAILOR, MD;  Location: [facility] ENDOSCOPY;  Service: Gastroenterology;  Laterality: N/A;   FRACTURE SURGERY     left ankle plate   HERNIA REPAIR     inguinal   KNEE SURGERY Right    x 4   SHOULDER SURGERY Bilateral    x 2   TRANSESOPHAGEAL ECHOCARDIOGRAM (CATH LAB) N/A 03/07/2024   Procedure: TRANSESOPHAGEAL ECHOCARDIOGRAM;  Surgeon: Shlomo Wilbert SAUNDERS, MD;  Location: [facility] INVASIVE  CV LAB;  Service: Cardiovascular;  Laterality: N/A;   VASECTOMY      Social History   Socioeconomic History   Marital status: Married    Spouse name: [name]   Number of children: 2   Years of education: Not on file   Highest education level: 12th grade  Occupational History    Comment: disability  Tobacco Use   Smoking status: Never   Smokeless tobacco: Current    Types: Snuff  Vaping Use   Vaping status: Never Used  Substance and Sexual Activity   Alcohol use: Not Currently   Drug use: Never   Sexual activity: Yes    Partners: Female  Other Topics Concern   Not on file  Social History Narrative   Lives  with wife   Social Drivers of Health   Financial Resource Strain: Low Risk  (10/18/2023)   Overall Financial Resource Strain (CARDIA)    Difficulty of Paying Living Expenses: Not hard at all  Food Insecurity: No Food Insecurity (04/20/2024)   Hunger Vital Sign    Worried About Running Out of Food in the Last Year: Never true    Ran Out of Food in the Last Year: Never true  Transportation Needs: No Transportation Needs (04/20/2024)   PRAPARE - Administrator, Civil Service (Medical): No    Lack of Transportation (Non-Medical): No  Physical Activity: Insufficiently Active (10/18/2023)   Exercise Vital Sign    Days of Exercise per Week: 2 days    Minutes of Exercise per Session: 60 min  Stress: Stress Concern Present (01/09/2023)   [employer] of Occupational Health - Occupational Stress Questionnaire    Feeling of Stress : To some extent  Social Connections: Unknown (10/18/2023)   Social Connection and Isolation Panel    Frequency of Communication with Friends and Family: More than three times a week    Frequency of Social Gatherings with Friends and Family: Once a week    Attends Religious Services: More than 4 times per year    Active Member of Clubs or Organizations: Patient declined    Attends Banker Meetings: Patient declined    Marital Status: Married    Family History  Problem Relation Age of Onset   Pulmonary fibrosis Mother    Hypertension Father    Other Father        liver failure   Diabetes Brother    Breast cancer Paternal Aunt    Colon cancer Neg Hx    Esophageal cancer Neg Hx    Stomach cancer Neg Hx    Rectal cancer Neg Hx     Health Maintenance  Topic Date Due   Hepatitis B Vaccines 19-59 Average Risk (2 of 3 - 19+ 3-dose series) 12/02/2022   COVID-19 Vaccine (4 - 2025-26 season) 04/23/2024   Influenza Vaccine  11/20/2024 (Originally 03/23/2024)   Medicare Annual Wellness (AWV)  04/03/2025   DTaP/Tdap/Td (3 - Td or Tdap)  06/20/2029   Colonoscopy  09/25/2031   Pneumococcal Vaccine: 50+ Years  Completed   Hepatitis C Screening  Completed   HIV Screening  Completed   Zoster Vaccines- Shingrix  Completed   HPV VACCINES  Aged Out   Meningococcal B Vaccine  Aged Out     ----------------------------------------------------------------------------------------------------------------------------------------------------------------------------------------------------------------- Physical Exam BP (!) 121/54 (BP Location: Left Arm, Patient Position: Sitting, Cuff Size: Normal)   Pulse 76   Temp 98.2 F (36.8 C) (Oral)   Resp 20   Ht 5' 5 (1.651  m)   Wt 193 lb (87.5 kg)   SpO2 98%   BMI 32.12 kg/m   Physical Exam Constitutional:      Appearance: Normal appearance.  HENT:     Head: Normocephalic and atraumatic.  Eyes:     General: No scleral icterus. Cardiovascular:     Rate and Rhythm: Normal rate and regular rhythm.  Pulmonary:     Effort: Pulmonary effort is normal.     Breath sounds: Normal breath sounds.  Musculoskeletal:     Cervical back: Neck supple.  Neurological:     General: No focal deficit present.     Mental Status: He is alert.  Psychiatric:        Mood and Affect: Mood normal.        Behavior: Behavior normal.     ------------------------------------------------------------------------------------------------------------------------------------------------------------------------------------------------------------------- Assessment and Plan  Prolonged QT interval He has mild QT prolongation.  Repeat EKG similar to EKGs in the hospital.  I reviewed previous EKGs over the past 2 years and his QT C has always been slightly prolonged.  This is likely being worsened by his chronic hypokalemia and hypomagnesemia.  Rechecking these levels today.  Gram-positive bacteremia He is off antibiotics at this time.  Recent blood cultures were negative.  No evidence of new vegetations on  TTE.  Source likely related to gallbladder disease.  He is planning to have cholecystectomy with [facility].  Hypocalcemia Rechecking vitamin D , calcium  and PTH levels.   Meds ordered this encounter  Medications   ciclopirox  (LOPROX ) 0.77 % cream    Sig: Apply topically 2 (two) times daily.    Dispense:  30 g    Refill:  3    No follow-ups on file.

## 2024-05-05 LAB — PTH, INTACT AND CALCIUM: PTH: 8 pg/mL — AB (ref 15–65)

## 2024-05-05 LAB — BASIC METABOLIC PANEL WITH GFR
BUN/Creatinine Ratio: 8 — ABNORMAL LOW (ref 9–20)
BUN: 8 mg/dL (ref 6–24)
CO2: 18 mmol/L — ABNORMAL LOW (ref 20–29)
Calcium: 9 mg/dL (ref 8.7–10.2)
Chloride: 105 mmol/L (ref 96–106)
Creatinine, Ser: 0.96 mg/dL (ref 0.76–1.27)
Glucose: 98 mg/dL (ref 70–99)
Potassium: 3.5 mmol/L (ref 3.5–5.2)
Sodium: 137 mmol/L (ref 134–144)
eGFR: 93 mL/min/1.73 (ref >59)

## 2024-05-05 LAB — VITAMIN D 25 HYDROXY (VIT D DEFICIENCY, FRACTURES): Vit D, 25-Hydroxy: 14.5 ng/mL — ABNORMAL LOW (ref 30.0–100.0)

## 2024-05-05 LAB — MAGNESIUM: Magnesium: 1 mg/dL — ABNORMAL LOW (ref 1.6–2.3)

## 2024-05-06 ENCOUNTER — Encounter: Payer: Self-pay | Admitting: Family Medicine

## 2024-05-06 NOTE — Assessment & Plan Note (Signed)
 Rechecking vitamin D , calcium  and PTH levels.

## 2024-05-06 NOTE — Assessment & Plan Note (Signed)
 He has mild QT prolongation.  Repeat EKG similar to EKGs in the hospital.  I reviewed previous EKGs over the past 2 years and his QT C has always been slightly prolonged.  This is likely being worsened by his chronic hypokalemia and hypomagnesemia.  Rechecking these levels today.

## 2024-05-06 NOTE — Assessment & Plan Note (Signed)
 He is off antibiotics at this time.  Recent blood cultures were negative.  No evidence of new vegetations on TTE.  Source likely related to gallbladder disease.  He is planning to have cholecystectomy with [facility].

## 2024-05-10 ENCOUNTER — Ambulatory Visit: Admitting: Internal Medicine

## 2024-05-10 ENCOUNTER — Ambulatory Visit: Payer: Self-pay | Admitting: Family Medicine

## 2024-05-10 ENCOUNTER — Other Ambulatory Visit (HOSPITAL_BASED_OUTPATIENT_CLINIC_OR_DEPARTMENT_OTHER): Payer: Self-pay

## 2024-05-10 MED ORDER — VITAMIN D (ERGOCALCIFEROL) 1.25 MG (50000 UNIT) PO CAPS
50000.0000 [IU] | ORAL_CAPSULE | ORAL | 1 refills | Status: AC
  Filled 2024-05-10: qty 12, 84d supply, fill #0

## 2024-05-18 ENCOUNTER — Other Ambulatory Visit: Payer: Self-pay

## 2024-05-18 DIAGNOSIS — F1910 Other psychoactive substance abuse, uncomplicated: Secondary | ICD-10-CM | POA: Insufficient documentation

## 2024-05-18 DIAGNOSIS — G4730 Sleep apnea, unspecified: Secondary | ICD-10-CM | POA: Insufficient documentation

## 2024-05-18 DIAGNOSIS — T7840XA Allergy, unspecified, initial encounter: Secondary | ICD-10-CM | POA: Insufficient documentation

## 2024-05-21 ENCOUNTER — Other Ambulatory Visit: Payer: Self-pay

## 2024-05-21 ENCOUNTER — Encounter: Payer: Self-pay | Admitting: Internal Medicine

## 2024-05-21 ENCOUNTER — Ambulatory Visit: Admitting: Internal Medicine

## 2024-05-21 VITALS — BP 142/82 | HR 93 | Temp 98.8°F | Wt 199.0 lb

## 2024-05-21 DIAGNOSIS — R7881 Bacteremia: Secondary | ICD-10-CM | POA: Diagnosis not present

## 2024-05-21 NOTE — Progress Notes (Signed)
 Patient: Juan [name]  DOB: 1966-07-06 MRN: [id number] PCP: Juan Bring, DO    Patient Active Problem List   Diagnosis Date Noted   Allergy    Sleep apnea    Substance abuse (HCC)    Chronic biliary pancreatitis (HCC) 04/22/2024   Bacterial endocarditis 04/21/2024   Pneumonia 04/20/2024   Abnormal chest x-ray 04/20/2024   Cholelithiasis 04/20/2024   Endocarditis, valve unspecified 03/20/2024   Intertrigo 03/20/2024   Acute bacterial endocarditis 03/07/2024   Abnormal finding on GI tract imaging 03/03/2024   Gram-positive bacteremia 02/26/2024   Sepsis (HCC) 02/15/2024   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) 01/23/2024   Right wrist pain 01/23/2024   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   Moderate aortic stenosis 11/17/2023   Obesity (BMI 30.0-34.9) 11/17/2023   Alcohol addiction (HCC)    Cirrhosis (HCC)    Colon polyps    Depression    Hemochromatosis    Hx of blood clots    Hyperreflexia    Hypertension    Traumatic hemorrhagic shock (HCC)    Right wrist injury 10/18/2023   AKI (acute kidney injury) 09/11/2023   Febrile illness 06/27/2023   Fever 06/20/2023   Bipolar 2 disorder, major depressive episode (HCC) 05/23/2023   Alcohol use disorder 05/23/2023   Arthritis 05/23/2023   Pain of left calf 04/11/2023   Chest trauma 03/18/2023   Acute pain of right knee 03/10/2023   Eustachian tube dysfunction 03/02/2023   Breast nodule 01/12/2023   Post-traumatic osteoarthritis of right knee 01/04/2023   Acute bacterial sinusitis 11/15/2022   Rib pain on right side 10/31/2022   Head injury 10/31/2022   Bilateral lower extremity edema 10/12/2022   Epistaxis 09/14/2022   Anasarca 09/14/2022   Hematemesis 09/13/2022   Lower extremity edema 09/06/2022   Hyperbilirubinemia 08/17/2022   Anxiety 08/17/2022   GI bleed 08/16/2022   Lumbar degenerative disc disease 07/26/2022   Prolonged QT interval 05/05/2022   Anemia, chronic disease  05/05/2022   Hyponatremia 03/02/2022   GERD (gastroesophageal reflux disease) 03/02/2022   Normocytic anemia 03/02/2022   Tobacco chew use 03/02/2022   Hiatal hernia 02/20/2022   Alcoholic cirrhosis of liver with ascites (HCC) 02/17/2022   DDD (degenerative disc disease), cervical 01/22/2022   Hypokalemia 01/11/2022   Hypomagnesemia 01/11/2022   Malnutrition of moderate degree 01/11/2022   Nonrheumatic aortic valve stenosis    Shoulder pain, left, posterior 12/22/2021   Portal hypertensive gastropathy (HCC)    Pancytopenia (HCC) 09/18/2021   Tinea pedis 05/12/2021   Diverticulosis 04/20/2021   Right lower quadrant pain 04/17/2021   Tibialis posterior tendinitis, right 04/14/2021   Dizziness 03/23/2021   Urinary frequency 03/23/2021   Right ankle sprain 02/12/2021   Well adult exam 01/06/2021   Systolic murmur 01/06/2021   Hypocalcemia 11/02/2020   Thrombocytopenia 11/02/2020   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 07/11/2020   Allergic reaction 07/08/2020   History of alcohol abuse 05/20/2020   Impingement syndrome, shoulder, right 05/20/2020   Fracture of laryngeal cartilage (HCC) 01/17/2020   Esophagitis, [place] grade D 12/05/2019   PTSD (post-traumatic stress disorder) 04/16/2019   MDD (major depressive disorder), recurrent severe, 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   History of bilateral inguinal hernia repair 01/19/2019   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 difficile x 3 in the past 2 years followed by infectious disease given history strep para sanguinous bacteremia call back to ED due to positive blood cultures presents for hospital follow-up of strep sanguinous and mitis/oralis  bacteremia.  Blood culture at outside facility on 6/18 grew strep para sanguinous, patient did not stay overnight and was discharged he received 1 dose IV antibiotics and then cefdinir .  Seen by PCP on 6/30 with blood cultures no growth he has been having fevers seen in ID clinic on 7/10 was plan was for treat with 6 weeks of IV penicillin given concern for endocarditis.  Blood cultures from 7/19 PCPs office were positive and patient called back. - Blood cultures from admission on 7/12 grew strep mitis 1 out of 2 sets and step para sanguinous 1 out of 2 sepsis.  Orthopantogram negative.  CT abdomen pelvis on admission showed acute pancreatitis, possible gallstone pericarditis or cholecystitis.  MRCP showed possible cholecystitis.  General surgery consulted no plans for OR.  TEE showed shaggy mobile dense aortic valve with hypermobile septum.  CT surgery with no plans for OR.  Discharged on ceftriaxone  x 4 weeks EOT 8/12.  Also given p.o. Vanco twice daily given C. difficile history.  Outpatient GI follow-up pending  03/21/2024: no missed doses. No gi issues. Some chang ein change, wihtout change in po intake 04/19/24: dong well. no new complaints.  Today: Denies fevers or chills. Not on abx Review of Systems  All other systems reviewed and are negative.   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   Allergic reaction 07/08/2020   Allergy    Anasarca 09/14/2022   Anemia, chronic disease  05/05/2022   Anxiety    Arthritis 05/23/2023   Bacterial endocarditis 04/21/2024   Benign essential hypertension 12/21/2016   Bilateral lower extremity edema 10/12/2022   Bipolar 2 disorder, major depressive episode (HCC) 05/23/2023   Breast nodule 01/12/2023   Chest trauma 03/18/2023   Cholelithiasis 04/20/2024   Chronic biliary pancreatitis (HCC) 04/22/2024   Chronic fatigue    Cirrhosis (HCC)    Colon polyps    DDD (degenerative disc disease), cervical 01/22/2022   Depression    Diverticulosis 04/20/2021   Dizziness 03/23/2021   Endocarditis, valve unspecified 03/20/2024   Epistaxis 09/14/2022   Esophagitis, [place] grade D 12/05/2019   Formatting of this note might be different from the original.  Chronic GERD with HH  09/2019--EGD--[facility], [place]--showed no varices; gr D esophagitis;  Rx: PPI daily     Eustachian tube dysfunction 03/02/2023   Febrile illness 06/27/2023   Fever 06/20/2023   Fracture of laryngeal cartilage (HCC) 01/17/2020   Last Assessment & Plan:   Formatting of this note might be different from the original.  Emergency department follow-up for evaluation of laryngeal trauma.  CT obtained the night of the injury is reviewed independently and shows nondisplaced fracture of the thyroid  cartilage.  In general, his voice is much improved  from the night of the trauma.  Denies any difficulty breathing.  EXAM shows minimal   GERD (gastroesophageal reflux disease)    GI bleed 08/16/2022   Gram-positive bacteremia 02/26/2024   Head injury 10/31/2022   Hematemesis 09/13/2022   Hemochromatosis    Hiatal hernia 02/20/2022   History of alcohol abuse 05/20/2020   History of bilateral inguinal hernia repair 01/19/2019   Hx of blood clots    Leg   Hyperbilirubinemia 08/17/2022   Hyperreflexia    Hypertension    Hypocalcemia 11/02/2020   Hypokalemia 01/11/2022   Hypomagnesemia 01/11/2022   Hyponatremia 03/02/2022   Impingement syndrome, shoulder, right 05/20/2020    Intertrigo 03/20/2024   Laceration of extensor hallucis longus tendon, left, initial encounter 02/06/2017   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 07/11/2020   Lower extremity edema 09/06/2022   Lumbar degenerative disc disease 07/26/2022   Malnutrition of moderate degree 01/11/2022   MDD (major depressive disorder), recurrent severe, 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   Moderate aortic stenosis 11/17/2023   Nonrheumatic aortic valve stenosis    Normocytic anemia 03/02/2022   Obesity (BMI 30.0-34.9) 11/17/2023   Pain of left calf 04/11/2023   Pancytopenia (HCC) 09/18/2021   Pneumonia 04/20/2024   Portal hypertensive gastropathy (HCC)    Post-traumatic osteoarthritis of right knee 01/04/2023   Prolonged QT interval 05/05/2022   PTSD (post-traumatic stress disorder)    Rib pain on right side 10/31/2022   Right ankle sprain 02/12/2021   Right lower quadrant pain 04/17/2021   Right wrist injury 10/18/2023   Right wrist pain 01/23/2024   Sepsis (HCC) 02/15/2024   Shoulder pain, left, posterior 12/22/2021   Sleep apnea    Substance abuse (HCC)    Systolic murmur 01/06/2021   Thrombocytopenia 11/02/2020   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 30 g 3   magnesium  oxide (MAG-OX) 400 (240 Mg) MG tablet Take 1 tablet (400 mg total) by mouth daily. 7 tablet 0   Vitamin D , Ergocalciferol , (DRISDOL ) 1.25 MG (50000 UNIT) CAPS capsule Take 1 capsule (50,000 Units total) by mouth every 7 (seven) days. 12 capsule 1   No facility-administered medications prior to visit.     Allergies  Allergen  Reactions   Cucumber Extract Itching and Nausea And Vomiting   Depakote Er [Divalproex Sodium Er] Swelling    Tongue swelling   Depakote [Valproic Acid] Anaphylaxis   Peanut (Diagnostic) Anaphylaxis and Swelling    Reaction to peanut butter, specifically   Peanut Oil Swelling   Shellfish Allergy Itching and Swelling    Shrimp, specifically   Codeine Itching and Rash    OK to take hydrocodone /oxycodone  with no issues   Firvanq  [Vancomycin ] Itching and Other (See Comments)    Redman syndrome after receiving via IV (unknown date, a few years ago) Patient complains of severe itching after being switched to IV formulation on 04/19/24 OK to take tablet formulation, no issues.     Lactose Intolerance (Gi) Diarrhea and Other (See Comments)    Bloating, flatulence Indigestion Stomach pain   Tylenol  [Acetaminophen ] Other (See Comments)    Hx of cirrhosis  Cantaloupe (Diagnostic) Rash    Social History   Tobacco Use   Smoking status: Never   Smokeless tobacco: Current    Types: Snuff  Vaping Use   Vaping status: Never Used  Substance Use Topics   Alcohol use: Not Currently   Drug use: Never    Family History  Problem Relation Age of Onset   Pulmonary fibrosis Mother    Hypertension Father    Other Father        liver failure   Diabetes Brother    Breast cancer Paternal Aunt    Colon cancer Neg Hx    Esophageal cancer Neg Hx    Stomach cancer Neg Hx    Rectal cancer Neg Hx     Objective:  There were no vitals filed for this visit. There is no height or weight on file to calculate BMI.  Physical Exam Constitutional:      General: He is not in acute distress.    Appearance: He is normal weight. He is not toxic-appearing.  HENT:     Head: Normocephalic and atraumatic.     Right Ear: External ear normal.     Left Ear: External ear normal.     Nose: No congestion or rhinorrhea.     Mouth/Throat:     Mouth: Mucous membranes are moist.     Pharynx: Oropharynx is  clear.  Eyes:     Extraocular Movements: Extraocular movements intact.     Conjunctiva/sclera: Conjunctivae normal.     Pupils: Pupils are equal, round, and reactive to light.  Cardiovascular:     Rate and Rhythm: Normal rate and regular rhythm.     Heart sounds: No murmur heard.    No friction rub. No gallop.  Pulmonary:     Effort: Pulmonary effort is normal.     Breath sounds: Normal breath sounds.  Abdominal:     General: Abdomen is flat. Bowel sounds are normal.     Palpations: Abdomen is soft.  Musculoskeletal:        General: No swelling.     Cervical back: Normal range of motion and neck supple.  Skin:    General: Skin is warm and dry.  Neurological:     General: No focal deficit present.     Mental Status: He is oriented to person, place, and time.  Psychiatric:        Mood and Affect: Mood normal.     Lab Results: Lab Results  Component Value Date   WBC 6.9 04/25/2024   HGB 9.7 (L) 04/25/2024   HCT 28.6 (L) 04/25/2024   MCV 92.0 04/25/2024   PLT 77 (L) 04/25/2024    Lab Results  Component Value Date   CREATININE 0.96 05/03/2024   BUN 8 05/03/2024   NA 137 05/03/2024   K 3.5 05/03/2024   CL 105 05/03/2024   CO2 18 (L) 05/03/2024    Lab Results  Component Value Date   ALT 24 04/21/2024   AST 65 (H) 04/21/2024   ALKPHOS 62 04/21/2024   BILITOT 5.3 (H) 04/21/2024     Assessment & Plan:  #Strep paradanguinis and mitis/oralis bacteremia #History of C. Difficile #Possible gallstone pancreatitis vs cholecysttiis -Blood culture at outside facility on 6/18 grew strep para sanguinous patient got 1 dose IV antibiotics and addition of cefdinir  and did not stay overnight as blood cultures returned later time - Seen by PCP on 6/30 blood cultures no growth.  Continue to have fevers defer to infectious  disease - Seen in ID clinic on 7/10 with plan for IV antibiotics x 6 weeks empiric endocarditis coverage - Blood cultures on 7/9 grew GPC.  Returned back.  Blood  cultures on that came back as strep para sanguinous - Repeat blood culture due to hospitalization 7/12 grew 1/2 aspirant strep mitis/oralis in 1/wrist sprain segments.  Of note workup including CT abdomen pelvis showed acute pancreatitis, possible gallstone pancreatitis versus cholecystitis.  MRCP showed possible cholecystitis.  General surgery with no plans for OR.  Patient has GI follow-up.  I suspect possible GI source for bacteremia given new species on stone/12 with Septra mitis/oralis and negative orthopantogram.  Of note he did have dental procedure prior to hospitalization on 6/18. - TEE showed shaggy mobile density on aortic valve. - Patient discharged on ceftriaxone  x 4 weeks EOT 8/12 extedn till 8/24 to complete 6 weeks. PICC out. -Seen by GI, referred ot [facility] abdominal transplant surgery for possbile GB removal. He  was admitted in the interim due to rigor, blood cx ng, treated with vanc and cts which was stopped during holipatizatin Plan  Blood cx today off of abx Pt notes paln for surgery friday at [facility] F/u with id prn   #Recurrent C. difficile - Patient placed on p.o. vancomycin  for secondary prophylaxis while on antibiotics and 7 days out(8/30)   Loney Stank, MD [facility]   05/21/24  5:52 AM   I have personally spent 45 minutes involved in face-to-face and non-face-to-face activities for this patient on the day of the visit. Professional time spent includes the following activities: Preparing to see the patient (review of tests), Obtaining and/or reviewing separately obtained history (admission/discharge record), Performing a medically appropriate examination and/or evaluation , Ordering medications/tests/procedures, referring and communicating with other health care professionals, Documenting clinical information in the EMR, Independently interpreting results (not separately reported), Communicating results to the  patient/family/caregiver, Counseling and educating the patient/family/caregiver and Care coordination (not separately reported).

## 2024-05-22 ENCOUNTER — Ambulatory Visit: Admitting: Cardiology

## 2024-05-25 DIAGNOSIS — Z9049 Acquired absence of other specified parts of digestive tract: Secondary | ICD-10-CM | POA: Insufficient documentation

## 2024-05-26 ENCOUNTER — Other Ambulatory Visit (HOSPITAL_BASED_OUTPATIENT_CLINIC_OR_DEPARTMENT_OTHER): Payer: Self-pay

## 2024-05-26 MED ORDER — TRAMADOL HCL 50 MG PO TABS
25.0000 mg | ORAL_TABLET | Freq: Four times a day (QID) | ORAL | 0 refills | Status: DC | PRN
  Filled 2024-05-26: qty 20, 5d supply, fill #0

## 2024-05-26 MED ORDER — SENNOSIDES-DOCUSATE SODIUM 8.6-50 MG PO TABS
2.0000 | ORAL_TABLET | Freq: Two times a day (BID) | ORAL | 0 refills | Status: AC | PRN
  Filled 2024-05-26: qty 30, 8d supply, fill #0

## 2024-05-27 LAB — CULTURE, BLOOD (SINGLE)
MICRO NUMBER:: 17032066
MICRO NUMBER:: 17032174
Result:: NO GROWTH
SPECIMEN QUALITY:: ADEQUATE

## 2024-05-28 ENCOUNTER — Other Ambulatory Visit (HOSPITAL_BASED_OUTPATIENT_CLINIC_OR_DEPARTMENT_OTHER): Payer: Self-pay

## 2024-05-28 NOTE — Addendum Note (Signed)
 Addended by: BONNY JON DEL on: 05/28/2024 11:29 AM   Modules accepted: Orders

## 2024-05-31 ENCOUNTER — Encounter: Payer: Self-pay | Admitting: Family Medicine

## 2024-05-31 ENCOUNTER — Ambulatory Visit (INDEPENDENT_AMBULATORY_CARE_PROVIDER_SITE_OTHER): Admitting: Family Medicine

## 2024-05-31 VITALS — BP 111/62 | HR 84 | Temp 98.4°F | Ht 65.0 in | Wt 202.0 lb

## 2024-05-31 DIAGNOSIS — K7031 Alcoholic cirrhosis of liver with ascites: Secondary | ICD-10-CM

## 2024-05-31 DIAGNOSIS — T148XXA Other injury of unspecified body region, initial encounter: Secondary | ICD-10-CM | POA: Diagnosis not present

## 2024-05-31 DIAGNOSIS — R17 Unspecified jaundice: Secondary | ICD-10-CM | POA: Insufficient documentation

## 2024-05-31 DIAGNOSIS — R110 Nausea: Secondary | ICD-10-CM | POA: Diagnosis not present

## 2024-05-31 MED ORDER — ONDANSETRON HCL 4 MG PO TABS: 4.0000 mg | ORAL_TABLET | Freq: Once | ORAL | Status: AC

## 2024-05-31 NOTE — Patient Instructions (Signed)
 The surgical site does not appear infected.  Restart spironolactone  to help with fluid in the abdomen.

## 2024-05-31 NOTE — Assessment & Plan Note (Signed)
 Recommend increased protein and low sodium.  Adding aldactone  back on.

## 2024-05-31 NOTE — Telephone Encounter (Signed)
 This patient shows tramadol  refill sent by Arcadio Bores, NP   on 05/26/2024 When I attempted to pend the order it showed an alert  This patient has a diagnosis of QT interval prolongation or [place] Pointes and should not receive drugs that prolong the QT interval. Consider removing the following QT Prolonging medication(s). For more information, providers can review medications that prolong the QT interval at [email or web address] (registration required).   Medication not written by our office but forwarding to Dr. Alvia for review as he is this patient's PCP and requesting this refill.

## 2024-05-31 NOTE — Progress Notes (Signed)
 Juan [name] - 58 y.o. male MRN [id number]  Date of birth: May 04, 1966  Subjective Chief Complaint  Patient presents with   Drainage from Incision    HPI Juan [name] is a 58 y.o. male here today for follow up visit.   Recently s/p robot assisted laparoscopic cholecystectomy.  Has noticed some drainage around surgical site. Drainage is clear/yellow in nature.  He first noticed this early this morning.  He denies increased pain, fever, chills, or redness around the surgical site.  He has been off of fluid pills for his ascites related to liver disease.  He has had a little nausea.  His appetite has not been very good.  Low protein intake since surgery.    ROS:  A comprehensive ROS was completed and negative except as noted per HPI  Allergies  Allergen Reactions   Cucumber Extract Itching and Nausea And Vomiting   Depakote Er [Divalproex Sodium Er] Swelling    Tongue swelling   Depakote [Valproic Acid] Anaphylaxis   Peanut (Diagnostic) Anaphylaxis and Swelling    Reaction to peanut butter, specifically   Peanut Oil Swelling   Shellfish Allergy Itching and Swelling    Shrimp, specifically   Codeine Itching and Rash    OK to take hydrocodone /oxycodone  with no issues   Firvanq  [Vancomycin ] Itching and Other (See Comments)    Redman syndrome after receiving via IV (unknown date, a few years ago) Patient complains of severe itching after being switched to IV formulation on 04/19/24 OK to take tablet formulation, no issues.     Lactose Intolerance (Gi) Diarrhea and Other (See Comments)    Bloating, flatulence Indigestion Stomach pain   Tylenol  [Acetaminophen ] Other (See Comments)    Hx of cirrhosis   Cantaloupe (Diagnostic) Rash    Past Medical History:  Diagnosis Date   Abnormal chest x-ray 04/20/2024   Abnormal finding on GI tract imaging 03/03/2024   Acute bacterial endocarditis 03/07/2024   Acute bacterial sinusitis 11/15/2022   Acute pain of right knee 03/10/2023    AKI (acute kidney injury) 09/11/2023   Alcohol addiction (HCC)    Alcohol use disorder 05/23/2023   Alcoholic cirrhosis of liver with ascites (HCC) 02/17/2022   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  Chronic GERD with HH  09/2019--EGD--[facility] [place]--showed no varices; gr D esophagitis;  Rx: PPI daily     Eustachian tube dysfunction 03/02/2023   Febrile illness 06/27/2023   Fever 06/20/2023   Fracture of laryngeal cartilage (HCC) 01/17/2020   Last Assessment & Plan:   Formatting of this note might be different from the  original.  Emergency department follow-up for evaluation of laryngeal trauma.  CT obtained the night of the injury is reviewed independently and shows nondisplaced fracture of the thyroid  cartilage.  In general, his voice is much improved from the night of the trauma.  Denies any difficulty breathing.  EXAM shows minimal   GERD (gastroesophageal reflux disease)    GI bleed 08/16/2022   Gram-positive bacteremia 02/26/2024   Head injury 10/31/2022    Hematemesis 09/13/2022   Hemochromatosis    Hiatal hernia 02/20/2022   History of alcohol abuse 05/20/2020   History of bilateral inguinal hernia repair 01/19/2019   Hx of blood clots    Leg   Hyperbilirubinemia 08/17/2022   Hyperreflexia    Hypertension    Hypocalcemia 11/02/2020   Hypokalemia 01/11/2022   Hypomagnesemia 01/11/2022   Hyponatremia 03/02/2022   Impingement syndrome, shoulder, right 05/20/2020   Intertrigo 03/20/2024   Laceration of extensor hallucis longus tendon, left, initial encounter 02/06/2017   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 07/11/2020   Lower extremity edema 09/06/2022   Lumbar degenerative disc disease 07/26/2022   Malnutrition of moderate degree 01/11/2022   MDD (major depressive disorder), recurrent severe, 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   Moderate aortic stenosis 11/17/2023   Nonrheumatic aortic valve stenosis    Normocytic anemia 03/02/2022   Obesity (BMI 30.0-34.9) 11/17/2023   Pain of left calf 04/11/2023   Pancytopenia (HCC) 09/18/2021   Pneumonia 04/20/2024   Portal hypertensive gastropathy (HCC)    Post-traumatic osteoarthritis of right knee 01/04/2023   Prolonged QT interval 05/05/2022   PTSD (post-traumatic stress disorder)    Rib pain on right side 10/31/2022   Right ankle sprain 02/12/2021   Right lower quadrant pain 04/17/2021   Right wrist injury 10/18/2023   Right wrist pain 01/23/2024   Sepsis (HCC) 02/15/2024   Shoulder pain, left, posterior 12/22/2021   Sleep apnea    Substance abuse (HCC)    Systolic murmur 01/06/2021   Thrombocytopenia 11/02/2020   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) 01/23/2024   Tibialis posterior tendinitis, right 04/14/2021   Tinea pedis 05/12/2021   Tobacco chew use 03/02/2022   Traumatic hemorrhagic shock (HCC)    Urinary frequency 03/23/2021   Well adult exam 01/06/2021    Past Surgical History:  Procedure Laterality Date   BIOPSY  09/24/2021    Procedure: BIOPSY;  Surgeon: Federico Rosario BROCKS, MD;  Location: [facility] ENDOSCOPY;  Service: Gastroenterology;;   ORIN MEDIATE RELEASE Bilateral    COLONOSCOPY WITH PROPOFOL  N/A 09/24/2021   Procedure: COLONOSCOPY WITH PROPOFOL ;  Surgeon: Federico Rosario BROCKS, MD;  Location: [facility] ENDOSCOPY;  Service: Gastroenterology;  Laterality: N/A;   ESOPHAGOGASTRODUODENOSCOPY N/A 03/06/2024   Procedure: EGD (ESOPHAGOGASTRODUODENOSCOPY);  Surgeon: Suzann Inocente HERO, MD;  Location: [facility] ENDOSCOPY;  Service: Gastroenterology;  Laterality: N/A;   ESOPHAGOGASTRODUODENOSCOPY (EGD) WITH PROPOFOL  N/A 09/24/2021   Procedure: ESOPHAGOGASTRODUODENOSCOPY (EGD) WITH PROPOFOL ;  Surgeon: Federico Rosario BROCKS, MD;  Location: [facility] ENDOSCOPY;  Service: Gastroenterology;  Laterality: N/A;   ESOPHAGOGASTRODUODENOSCOPY (EGD) WITH PROPOFOL  N/A 08/18/2022   Procedure: ESOPHAGOGASTRODUODENOSCOPY (EGD) WITH PROPOFOL ;  Surgeon: Abran Norleen SAILOR, MD;  Location: [facility] ENDOSCOPY;  Service: Gastroenterology;  Laterality: N/A;   FLEXIBLE SIGMOIDOSCOPY N/A 08/18/2022   Procedure: FLEXIBLE SIGMOIDOSCOPY;  Surgeon: Abran Norleen SAILOR, MD;  Location: [facility]COPY;  Service: Gastroenterology;  Laterality: N/A;   FRACTURE SURGERY     left ankle plate   HERNIA REPAIR     inguinal   KNEE SURGERY Right    x  4   SHOULDER SURGERY Bilateral    x 2   TRANSESOPHAGEAL ECHOCARDIOGRAM (CATH LAB) N/A 03/07/2024   Procedure: TRANSESOPHAGEAL ECHOCARDIOGRAM;  Surgeon: Shlomo Wilbert SAUNDERS, MD;  Location: [facility] INVASIVE CV LAB;  Service: Cardiovascular;  Laterality: N/A;   VASECTOMY      Social History   Socioeconomic History   Marital status: Married    Spouse name: [name]   Number of children: 2   Years of education: Not on file   Highest education level: 12th grade  Occupational History    Comment: disability  Tobacco Use   Smoking status: Never   Smokeless tobacco: Current    Types: Snuff  Vaping Use   Vaping status: Never Used  Substance and Sexual Activity   Alcohol use: Not  Currently   Drug use: Never   Sexual activity: Yes    Partners: Female  Other Topics Concern   Not on file  Social History Narrative   Lives with wife   Social Drivers of Health   Financial Resource Strain: Low Risk  (05/25/2024)   Received from [facility]   Overall Financial Resource Strain (CARDIA)    Difficulty of Paying Living Expenses: Not very hard  Food Insecurity: No Food Insecurity (05/25/2024)   [facility]   Hunger Vital Sign    Within the past 12 months, you worried that your food would run out before you got the money to buy more.: Never true    Within the past 12 months, the food you bought just didn't last and you didn't have money to get more.: Never true  Transportation Needs: Unknown (05/25/2024)   Received from [facility] - Transportation    In the past 12 months, has lack of transportation kept you from medical appointments or from getting medications?: No    Lack of Transportation (Non-Medical): Not on file  Physical Activity: Insufficiently Active (10/18/2023)   Exercise Vital Sign    Days of Exercise per Week: 2 days    Minutes of Exercise per Session: 60 min  Stress: Stress Concern Present (01/09/2023)   [employer] of Occupational Health - Occupational Stress Questionnaire    Feeling of Stress : To some extent  Social Connections: Unknown (10/18/2023)   Social Connection and Isolation Panel    Frequency of Communication with Friends and Family: More than three times a week    Frequency of Social Gatherings with Friends and Family: Once a week    Attends Religious Services: More than 4 times per year    Active Member of Clubs or Organizations: Patient declined    Attends Banker Meetings: Patient declined    Marital Status: Married    Family History  Problem Relation Age of Onset   Pulmonary fibrosis Mother    Hypertension Father    Other Father         liver failure   Diabetes Brother    Breast cancer Paternal Aunt    Colon cancer Neg Hx    Esophageal cancer Neg Hx    Stomach cancer Neg Hx    Rectal cancer Neg Hx     Health Maintenance  Topic Date Due   Hepatitis B Vaccines 19-59 Average Risk (2 of 3 - 19+ 3-dose series) 12/02/2022   COVID-19 Vaccine (4 - 2025-26 season) 04/23/2024   Influenza Vaccine  11/20/2024 (Originally 03/23/2024)   Medicare Annual Wellness (AWV)  04/03/2025   DTaP/Tdap/Td (3 -  Td or Tdap) 06/20/2029   Colonoscopy  09/25/2031   Pneumococcal Vaccine: 50+ Years  Completed   Hepatitis C Screening  Completed   HIV Screening  Completed   Zoster Vaccines- Shingrix  Completed   HPV VACCINES  Aged Out   Meningococcal B Vaccine  Aged Out     ----------------------------------------------------------------------------------------------------------------------------------------------------------------------------------------------------------------- Physical Exam BP 111/62 (BP Location: Left Arm, Patient Position: Sitting, Cuff Size: Large)   Pulse 84   Temp 98.4 F (36.9 C) (Oral)   Ht 5' 5 (1.651 m)   Wt 202 lb (91.6 kg)   SpO2 97%   BMI 33.61 kg/m   Physical Exam Constitutional:      Appearance: Normal appearance.  Eyes:     General: No scleral icterus. Cardiovascular:     Rate and Rhythm: Normal rate and regular rhythm.  Pulmonary:     Effort: Pulmonary effort is normal.     Breath sounds: Normal breath sounds.  Neurological:     Mental Status: He is alert.  Psychiatric:        Mood and Affect: Mood normal.        Behavior: Behavior normal.     ------------------------------------------------------------------------------------------------------------------------------------------------------------------------------------------------------------------- Assessment and Plan  Wound drainage Serous drainage from wound.  No signs of wound infection.  Some increased abdominal distention likely  from ascites.  Restarting aldactone .  Has appt with GI next week    Scleral icterus Rechecking LFT's and bilirubin.   Nausea Zofran  given today.   Alcoholic cirrhosis of liver with ascites (HCC) Recommend increased protein and low sodium.  Adding aldactone  back on.    Meds ordered this encounter  Medications   ondansetron  (ZOFRAN ) tablet 4 mg    No follow-ups on file.

## 2024-05-31 NOTE — Assessment & Plan Note (Signed)
 Rechecking LFT's and bilirubin.

## 2024-05-31 NOTE — Assessment & Plan Note (Signed)
 Serous drainage from wound.  No signs of wound infection.  Some increased abdominal distention likely from ascites.  Restarting aldactone .  Has appt with GI next week

## 2024-05-31 NOTE — Assessment & Plan Note (Signed)
Zofran given today

## 2024-06-02 LAB — CMP14+EGFR
ALT: 17 IU/L (ref 0–44)
AST: 43 IU/L — ABNORMAL HIGH (ref 0–40)
Albumin: 2.4 g/dL — ABNORMAL LOW (ref 3.8–4.9)
Alkaline Phosphatase: 123 IU/L (ref 47–123)
BUN/Creatinine Ratio: 8 — ABNORMAL LOW (ref 9–20)
BUN: 8 mg/dL (ref 6–24)
Bilirubin Total: 3.3 mg/dL — ABNORMAL HIGH (ref 0.0–1.2)
CO2: 21 mmol/L (ref 20–29)
Calcium: 7.8 mg/dL — ABNORMAL LOW (ref 8.7–10.2)
Chloride: 100 mmol/L (ref 96–106)
Creatinine, Ser: 0.98 mg/dL (ref 0.76–1.27)
Globulin, Total: 2.4 g/dL (ref 1.5–4.5)
Glucose: 100 mg/dL — ABNORMAL HIGH (ref 70–99)
Potassium: 3.4 mmol/L — ABNORMAL LOW (ref 3.5–5.2)
Sodium: 133 mmol/L — ABNORMAL LOW (ref 134–144)
Total Protein: 4.8 g/dL — ABNORMAL LOW (ref 6.0–8.5)
eGFR: 91 mL/min/1.73 (ref >59)

## 2024-06-02 LAB — CBC WITH DIFFERENTIAL/PLATELET
Basophils Absolute: 0.1 x10E3/uL (ref 0.0–0.2)
Basos: 1 %
EOS (ABSOLUTE): 0.3 x10E3/uL (ref 0.0–0.4)
Eos: 5 %
Hematocrit: 23.5 % — ABNORMAL LOW (ref 37.5–51.0)
Hemoglobin: 8.3 g/dL — ABNORMAL LOW (ref 13.0–17.7)
Immature Grans (Abs): 0 x10E3/uL (ref 0.0–0.1)
Immature Granulocytes: 0 %
Lymphocytes Absolute: 1.3 x10E3/uL (ref 0.7–3.1)
Lymphs: 23 %
MCH: 30.9 pg (ref 26.6–33.0)
MCHC: 35.3 g/dL (ref 31.5–35.7)
MCV: 87 fL (ref 79–97)
Monocytes Absolute: 1.2 x10E3/uL — ABNORMAL HIGH (ref 0.1–0.9)
Monocytes: 21 %
Neutrophils Absolute: 2.8 x10E3/uL (ref 1.4–7.0)
Neutrophils: 50 %
Platelets: 72 x10E3/uL — CL (ref 150–450)
RBC: 2.69 x10E6/uL — CL (ref 4.14–5.80)
RDW: 15.8 % — ABNORMAL HIGH (ref 11.6–15.4)
WBC: 5.7 x10E3/uL (ref 3.4–10.8)

## 2024-06-04 ENCOUNTER — Other Ambulatory Visit (HOSPITAL_BASED_OUTPATIENT_CLINIC_OR_DEPARTMENT_OTHER): Payer: Self-pay

## 2024-06-04 MED ORDER — TRAMADOL HCL 50 MG PO TABS
50.0000 mg | ORAL_TABLET | Freq: Three times a day (TID) | ORAL | 0 refills | Status: AC | PRN
  Filled 2024-06-04: qty 21, 7d supply, fill #0

## 2024-06-04 NOTE — Progress Notes (Unsigned)
 06/06/2024 Glendia Alas [id number] 04-27-1966  Referring provider: Alvia Bring, DO Primary GI doctor: Dr. Federico  ASSESSMENT AND PLAN:  Cirrhosis secondary to alcohol s/p cholecystectomy for possible gallstone pancreatitis 05/25/2024 at [facility] with some worsening ascites, juandice. Denies fever, chills, Ab pain.  with history of portal hypertensive gastropathy, ascites, thrombocytopenia, myopathy and hepatic encephalopathy MELD 3.0: 21 at 04/06/2024 10:19 AM MELD-Na: 21 at 04/06/2024 10:19 AM Calculated from: Serum Creatinine: 0.87 mg/dL (Using min of 1 mg/dL) at 1/84/7974 89:80 AM Serum Sodium: 134 mEq/L at 04/06/2024 10:19 AM Total Bilirubin: 3.6 mg/dL at 1/84/7974 89:80 AM Serum Albumin: 2.7 g/dL at 1/84/7974 89:80 AM INR(ratio): 2 ratio at 04/06/2024 10:19 AM Age at listing (hypothetical): 58 years Sex: Male at 04/06/2024 10:19 AM  Ascites:       patient on spironolactone  100 mg daily and lasix  20 mg daily since his surgery, last 10 days, still on potassium  as needed, has not taken in 2 weeks. Has been on potassium 20 meq BID and magnesium  400 mg - having gynecomastia with the spironolactone , will switch to Midamor 10 mg BID, continue lasix  20 mg, consider increasing potassium sparing or lasix  pending labs - recheck labs with Dr. Alvia on Friday and then again in 2-3 weeks at our office - Diagnostic and theraputic paracentesis- if they take off 4-5 L please give IV 25 grams albumin x 1 Please send for cell count with differential, albumin concentration, total protein concentration, cell cytology No maximum limit to be removed -Nutrition and low sodium diet discussed with patient and information given  Varices screening / surveillance EGD:     Last EGD 02/2024 No history of varices. Has had some elevated BP/HR, consider adding on coreg but will wait until patient is more stable with ascites and post surgical Recall EGD 2 years unless patient has worsening anemia/melena.    Hepatic encephalopathy:  Pt does not report any symptoms consistent with HE and no asterixis on exam.  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  Negative PETH 05/25/2024 S/p [facility]olecystectomy at [facility] Has had some drainage from wound, worsening ascites and jaundice but over all doing well. No AB pain, fever, chills.   Leakage from wound Has seen Dr. Alvia 10/09, serous drainage Denies AB pain, fever, chills.  Okay to glue area together, does not look infected  Bacteremia secondary to gram-positive with associated acute bacterial endocarditis Status post PICC line 7/16 Continue follow up with cardio/ID, last seen ID 05/22/24 Dr. Dennise 04/21/2024 Echo 70-75%, aortic valve gradient 16-28 mm, VTI 1.55, aortic valve mean gradient 28, V max 3.4 m/s, no vegetation Continue follow up  Moderate aortic stenosis 04/21/2024 Echo 70-75%, aortic valve gradient 16-28 mm, VTI 1.55, aortic valve mean gradient 28, V max 3.4 m/s, no vegitation  Acute on chronic normocytic anemia 06/01/24 HGB 8.3 ( 9.7) (9.7) Recent Labs  03/07/24 0557 03/09/24 0308 04/06/24 1019 04/19/24 2100 04/20/24 0820 04/21/24 0435 04/23/24 0356 04/24/24 0527 04/25/24 0435 06/01/24 0948  HGB 8.2* 7.7* 11.2* 9.4* 8.9* 9.8* 9.9* 9.7* 9.7* 8.3*  Likely due to anemia of chronic disease  as well as cirrhosis with portal gastropathy and GAVE, had 1 unit PRBC during hospitalization EGD no varices but did show GAVE and portal gastropathy no black stools, blood in stool.  Feb 2023 colonoscopy On iron supplement once a day Recheck iron May need in hospital APC for GAVE if worsening anemia or not responsive to iron, ER precautions discussed  Patient Care Team: Alvia Bring, DO as PCP - General (Family Medicine)  HISTORY OF PRESENT ILLNESS: 58 y.o. male with medical history significant for several admissions for pneumonia, bacteremia, alcoholic cirrhosis, myopathy, hypertension, portal hypertension, alcohol abuse, C. difficile, PTSD, bipolar disorder, GERD presents for follow up of cirrhosis secondary to ETOH. Has history of the following complications from cirrhosis: portal hypertensive gastropathy, ascites, thrombocytopenia, and hepatic encephalopathy   Wt Readings from Last 3 Encounters:  06/06/24 196 lb 2 oz (89 kg)  05/31/24 202 lb (91.6 kg)  05/21/24 199 lb (90.3 kg)   Discussed the use of AI scribe software for clinical note transcription with the patient, who gave verbal consent to proceed.  History of Present Illness   Kieran Arreguin is a 58 year old male with a history of pancreatitis and recent gallbladder removal who presents with abdominal fluid retention and wound drainage.  He has ongoing drainage from the surgical incision site, which is clear and yellowish. The drainage did not occur last night but resumed this morning after he strained himself, attributing it to actions such as 'rescuing a kitty.'  He experiences fluid retention, contributing to weight gain. He is on spironolactone  100 mg daily and Lasix  20 mg daily, along with potassium and magnesium  supplements, and follows a low sodium diet. This medication regimen has been in place for the past ten days.  No abdominal discomfort, fevers, or chills. He also denies any dark black stools, nausea, or  vomiting.  He has a history of pancreatitis, initially suspected to be related to his gallbladder, which was removed. The procedure went well, but he continues to experience jaundice and fluid retention.  He stopped taking tramadol  due to constipation and has been using stool softeners. He has not experienced significant pain since discontinuing tramadol  two days ago.  He underwent an MRI of the abdomen in September and a CT scan on August 30th. He recalls a procedure involving gel and a scope, leading to a diagnosis of pancreatitis, although this was later disputed by another doctor.  He has been avoiding lactulose  after it worked effectively, and he has not experienced confusion or symptoms of hepatic encephalopathy. He mentions needing to use stool softeners again.      Social history:  He  reports that he has never smoked. His smokeless tobacco use includes snuff. He reports that he does not currently use alcohol. He reports that he does not use drugs.  RELEVANT GI HISTORY, LABS, IMAGING:  CBC    Component Value Date/Time   WBC 5.7 06/01/2024 0948   WBC 6.9 04/25/2024 0435   RBC 2.69 (LL) 06/01/2024 0948   RBC 3.11 (L) 04/25/2024 0435   HGB 8.3 (L) 06/01/2024 0948   HCT 23.5 (L) 06/01/2024 0948   PLT 72 (LL) 06/01/2024 0948   MCV 87 06/01/2024 0948   MCH 30.9 06/01/2024 0948   MCH 31.2 04/25/2024  0435   MCHC 35.3 06/01/2024 0948   MCHC 33.9 04/25/2024 0435   RDW 15.8 (H) 06/01/2024 0948   LYMPHSABS 1.3 06/01/2024 0948   MONOABS 0.8 04/19/2024 2100   EOSABS 0.3 06/01/2024 0948   BASOSABS 0.1 06/01/2024 0948   Recent Labs    03/07/24 0557 03/09/24 0308 04/06/24 1019 04/19/24 2100 04/20/24 0820 04/21/24 0435 04/23/24 0356 04/24/24 0527 04/25/24 0435 06/01/24 0948  HGB 8.2* 7.7* 11.2* 9.4* 8.9* 9.8* 9.9* 9.7* 9.7* 8.3*    CMP     Component Value Date/Time   NA 133 (L) 06/01/2024 0948   K 3.4 (L) 06/01/2024 0948   CL 100 06/01/2024 0948   CO2 21 06/01/2024  0948   GLUCOSE 100 (H) 06/01/2024 0948   GLUCOSE 80 04/25/2024 0435   BUN 8 06/01/2024 0948   CREATININE 0.98 06/01/2024 0948   CREATININE 0.63 (L) 10/01/2022 1148   CALCIUM  7.8 (L) 06/01/2024 0948   PROT 4.8 (L) 06/01/2024 0948   ALBUMIN 2.4 (L) 06/01/2024 0948   AST 43 (H) 06/01/2024 0948   ALT 17 06/01/2024 0948   ALKPHOS 123 06/01/2024 0948   BILITOT 3.3 (H) 06/01/2024 0948   GFRNONAA >60 04/25/2024 0435   GFRNONAA 101 01/06/2021 0000   GFRAA 117 01/06/2021 0000      Latest Ref Rng & Units 06/01/2024    9:48 AM 04/21/2024    4:35 AM 04/19/2024    9:00 PM  Hepatic Function  Total Protein 6.0 - 8.5 g/dL 4.8  5.6  5.4   Albumin 3.8 - 4.9 g/dL 2.4  2.0  2.7   AST 0 - 40 IU/L 43  65  76   ALT 0 - 44 IU/L 17  24  26    Alk Phosphatase 47 - 123 IU/L 123  62  90   Total Bilirubin 0.0 - 1.2 mg/dL 3.3  5.3  2.4       Latest Ref Rng & Units 04/06/2024   12:00 AM  Hepatitis C  AFP <6.1 ng/mL 10.5     Current Medications:     Current Outpatient Medications (Cardiovascular):    aMILoride (MIDAMOR) 5 MG tablet, Take 2 tablets (10 mg total) by mouth 2 (two) times daily.   furosemide  (LASIX ) 20 MG tablet, Take 20 mg by mouth daily.     Current Outpatient Medications (Analgesics):    traMADol  (ULTRAM ) 50 MG tablet, Take 1 tablet (50 mg total) by mouth every 8 (eight) hours as needed for pain for up to 7 days.     Current Outpatient Medications (Other):    ciclopirox  (LOPROX ) 0.77 % cream, Apply topically 2 (two) times daily.   magnesium  oxide (MAG-OX) 400 (240 Mg) MG tablet, Take 1 tablet (400 mg total) by mouth daily.   pantoprazole  (PROTONIX ) 40 MG tablet, Take 40 mg by mouth daily.   potassium chloride  SA (KLOR-CON  M) 20 MEQ tablet, Take 20 mEq by mouth 2 (two) times daily.   senna-docusate (SENOKOT-S) 8.6-50 MG tablet, Take 2 tablets by mouth 2 (two) times daily as needed for Constipation.   Vitamin D , Ergocalciferol , (DRISDOL ) 1.25 MG (50000 UNIT) CAPS capsule, Take 1  capsule (50,000 Units total) by mouth every 7 (seven) days.  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  Chronic GERD with HH  09/2019--EGD--[facility], [place]--showed no varices; gr D esophagitis;  Rx: PPI daily     Eustachian tube dysfunction 03/02/2023   Febrile illness 06/27/2023   Fever 06/20/2023   Fracture of laryngeal cartilage (HCC) 01/17/2020   Last Assessment & Plan:   Formatting of this note might be different from the original.  Emergency department follow-up  for evaluation of laryngeal trauma.  CT obtained the night of the injury is reviewed independently and shows nondisplaced fracture of the thyroid  cartilage.  In general, his voice is much improved from the night of the trauma.  Denies any difficulty breathing.  EXAM shows minimal   GERD (gastroesophageal reflux disease)    GI bleed 08/16/2022   Gram-positive bacteremia 02/26/2024   Head injury 10/31/2022   Hematemesis 09/13/2022   Hemochromatosis    Hiatal hernia 02/20/2022   History of alcohol abuse 05/20/2020   History of bilateral inguinal hernia repair 01/19/2019   Hx of blood clots    Leg   Hyperbilirubinemia 08/17/2022   Hyperreflexia    Hypertension    Hypocalcemia 11/02/2020   Hypokalemia 01/11/2022   Hypomagnesemia 01/11/2022   Hyponatremia 03/02/2022   Impingement syndrome, shoulder, right 05/20/2020   Intertrigo 03/20/2024   Laceration of extensor hallucis longus tendon, left, initial encounter 02/06/2017   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 07/11/2020   Lower extremity edema 09/06/2022   Lumbar degenerative disc disease 07/26/2022   Malnutrition of moderate degree 01/11/2022   MDD (major depressive disorder), recurrent severe, 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   Moderate aortic stenosis 11/17/2023   Nonrheumatic aortic valve stenosis    Normocytic anemia 03/02/2022   Obesity (BMI 30.0-34.9) 11/17/2023   Pain of left calf 04/11/2023   Pancytopenia (HCC) 09/18/2021   Pneumonia 04/20/2024   Portal hypertensive gastropathy (HCC)    Post-traumatic osteoarthritis of right knee 01/04/2023   Prolonged QT interval 05/05/2022   PTSD (post-traumatic stress disorder)    Rib pain on right side 10/31/2022   Right ankle sprain 02/12/2021   Right lower quadrant pain 04/17/2021   Right wrist injury 10/18/2023   Right wrist pain 01/23/2024   Sepsis (HCC) 02/15/2024   Shoulder pain, left, posterior 12/22/2021   Sleep apnea    Substance abuse (HCC)    Systolic  murmur 94/82/7977   Thrombocytopenia  11/02/2020   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) 01/23/2024   Tibialis posterior tendinitis, right 04/14/2021   Tinea pedis 05/12/2021   Tobacco chew use 03/02/2022   Traumatic hemorrhagic shock (HCC)    Urinary frequency 03/23/2021   Well adult exam 01/06/2021   Allergies:  Allergies  Allergen Reactions   Cucumber Extract Itching and Nausea And Vomiting   Depakote Er [Divalproex Sodium Er] Swelling    Tongue swelling   Depakote [Valproic Acid] Anaphylaxis   Peanut (Diagnostic) Anaphylaxis and Swelling    Reaction to peanut butter, specifically   Peanut Oil Swelling   Shellfish Allergy Itching and Swelling    Shrimp, specifically   Codeine Itching and Rash    OK to take hydrocodone /oxycodone  with no issues   Firvanq  [Vancomycin ] Itching and Other (See Comments)    Redman syndrome after receiving via IV (unknown date, a few years ago) Patient complains of severe itching after being switched to IV formulation on 04/19/24 OK to take tablet formulation, no issues.     Lactose Intolerance (Gi) Diarrhea and Other (See Comments)    Bloating, flatulence Indigestion Stomach pain   Tylenol  [Acetaminophen ] Other (See Comments)    Hx of cirrhosis   Cantaloupe (Diagnostic) Rash     Surgical History:  He  has a past surgical history that includes Hernia repair; Vasectomy; Fracture surgery; Knee surgery (Right); Shoulder surgery (Bilateral); Carpal tunnel release (Bilateral); Esophagogastroduodenoscopy (egd) with propofol  (N/A, 09/24/2021); Colonoscopy with propofol  (N/A, 09/24/2021); biopsy (09/24/2021); Esophagogastroduodenoscopy (egd) with propofol  (N/A, 08/18/2022); Flexible sigmoidoscopy (N/A, 08/18/2022); Esophagogastroduodenoscopy (N/A, 03/06/2024); and TRANSESOPHAGEAL ECHOCARDIOGRAM (N/A, 03/07/2024). Family History:  His family history includes Breast cancer in his paternal aunt; Diabetes in his brother; Hypertension in his  father; Other in his father; Pulmonary fibrosis in his mother.  REVIEW OF SYSTEMS  : All other systems reviewed and negative except where noted in the History of Present Illness.  PHYSICAL EXAM: BP 134/74 (BP Location: Left Arm, Patient Position: Sitting, Cuff Size: Normal)   Pulse 76   Ht 5' 3 (1.6 m)   Wt 196 lb 2 oz (89 kg)   BMI 34.74 kg/m  Physical Exam   Physical Exam   GENERAL APPEARANCE: Well nourished, in no apparent distress. HEENT: No cervical lymphadenopathy, unremarkable thyroid , scleral icterus present, conjunctiva pink. RESPIRATORY: Respiratory effort normal, breath sounds clear to auscultation bilaterally without rales, rhonchi, or wheezing. CARDIO: Regular rate and rhythm with systolic murmur, peripheral pulses intact. ABDOMEN: Protuberant abdomen with ecchymosis along laparoscopic cholecystectomy scars, ascites present, right-sided abdominal area with 3 cm drainage, no induration, warmth, or redness, yellow discharge from abdominal drainage site, active bowel sounds in all 4 quadrants, no tenderness to palpation, no rebound, no mass appreciated. RECTAL: Declines. MUSCULOSKELETAL: Full range of motion, normal gait, without edema. SKIN: Dry, intact without rashes or lesions. No jaundice. NEURO: Alert, oriented, no focal deficits. PSYCH: Cooperative, normal mood and affect. EXTREMITIES: 1-2+ pitting edema in lower extremities, no redness or warmth in legs.          Alan JONELLE Coombs, PA-C 10:05 AM

## 2024-06-06 ENCOUNTER — Other Ambulatory Visit (HOSPITAL_BASED_OUTPATIENT_CLINIC_OR_DEPARTMENT_OTHER): Payer: Self-pay

## 2024-06-06 ENCOUNTER — Ambulatory Visit (INDEPENDENT_AMBULATORY_CARE_PROVIDER_SITE_OTHER): Admitting: Physician Assistant

## 2024-06-06 ENCOUNTER — Encounter: Payer: Self-pay | Admitting: Physician Assistant

## 2024-06-06 ENCOUNTER — Other Ambulatory Visit: Payer: Self-pay

## 2024-06-06 VITALS — BP 134/74 | HR 76 | Ht 63.0 in | Wt 196.1 lb

## 2024-06-06 DIAGNOSIS — R7881 Bacteremia: Secondary | ICD-10-CM

## 2024-06-06 DIAGNOSIS — D638 Anemia in other chronic diseases classified elsewhere: Secondary | ICD-10-CM

## 2024-06-06 DIAGNOSIS — D509 Iron deficiency anemia, unspecified: Secondary | ICD-10-CM

## 2024-06-06 DIAGNOSIS — G621 Alcoholic polyneuropathy: Secondary | ICD-10-CM

## 2024-06-06 DIAGNOSIS — D696 Thrombocytopenia, unspecified: Secondary | ICD-10-CM

## 2024-06-06 DIAGNOSIS — Z9049 Acquired absence of other specified parts of digestive tract: Secondary | ICD-10-CM

## 2024-06-06 DIAGNOSIS — I350 Nonrheumatic aortic (valve) stenosis: Secondary | ICD-10-CM

## 2024-06-06 DIAGNOSIS — K851 Biliary acute pancreatitis without necrosis or infection: Secondary | ICD-10-CM | POA: Diagnosis not present

## 2024-06-06 DIAGNOSIS — K3189 Other diseases of stomach and duodenum: Secondary | ICD-10-CM

## 2024-06-06 DIAGNOSIS — K7031 Alcoholic cirrhosis of liver with ascites: Secondary | ICD-10-CM | POA: Diagnosis not present

## 2024-06-06 DIAGNOSIS — I330 Acute and subacute infective endocarditis: Secondary | ICD-10-CM

## 2024-06-06 MED ORDER — AMILORIDE HCL 5 MG PO TABS
10.0000 mg | ORAL_TABLET | Freq: Two times a day (BID) | ORAL | 0 refills | Status: AC
  Filled 2024-06-06: qty 360, 90d supply, fill #0

## 2024-06-06 NOTE — Progress Notes (Signed)
 Noted. Supervising in Dr. Lafonda absence. Ongoing continuity of care with Dr. Federico

## 2024-06-06 NOTE — Patient Instructions (Signed)
 Get labs with Dr. Alvia Repeat labs here with BMP, magnesium  in 2-4 weeks Follow up 6-8 weeks  VISIT SUMMARY:  Today, we addressed your abdominal fluid retention, wound drainage, and medication side effects. We made some changes to your medications and provided instructions for wound care.  YOUR PLAN:  ASCITES AND LOWER EXTREMITY EDEMA: You have fluid buildup in your abdomen and swelling in your legs due to liver cirrhosis. -We will perform a paracentesis to relieve the fluid buildup and check for any infection. -We will recheck your labs, including kidney function, in two weeks. -We are switching your medication from spironolactone  to amiloride 10 mg twice daily. -We may consider increasing your Lasix  if necessary.  JAUNDICE: Your ongoing jaundice is likely due to liver cirrhosis. -There are no signs of hepatic encephalopathy at this time.  HYPONATREMIA AND HYPOKALEMIA: You have low sodium and potassium levels. -Continue taking your potassium supplements. -We will recheck your electrolytes in two weeks.  GYNECOMASTIA AND ADVERSE EFFECTS DUE TO SPIRONOLACTONE : You have breast pain and swelling due to spironolactone . -We are discontinuing spironolactone . -We are starting you on amiloride 10 mg twice daily.  WOUND DRAINAGE AND IRRITANT CONTACT DERMATITIS: You have clear drainage and skin irritation around your surgical incision. -Apply hydrocortisone  cream to the irritated area around the incision. -Allow the incision to air out when possible. -Consider using alternative tape to reduce irritation.  TYLENOL  (ACETAMINOPHEN ) IS SAFE IN LIVER DISEASE: You can take tylenol  (acetaminophen ) up to 2,000 mg/day. This would be four extra strength (500mg ) tablets over 24 hours OR six regular strength (325 mg) tablets over 24 hours. Please be sure to read the ingredients of over the counter medications and prescription pain medications as many contain acetaminophen .   NO NSAIDS (ibuprofen ,  advil , naproxen, aleve, motrin ...)  HEPATIC ENCEPHALOPATHY HEPATIC ENCEPHALOPATHY: Confusion caused by a build up of toxins in the blood due to the liver not being able to filter toxins. This can cause confusion mild or severe, increase falls. If you have been diagnosed with Hepatic encephalopathy, advised to not drive due to increased risk   LACTULOSE :  helps pull ammonia and other toxins from the blood into your stool when you have a bowel movement NO NEED TO CHECK AMMONIA LEVEL IN BLOOD! Only need to check if you are having symptoms. 30-66ml up to four times a day Take a dose in the morning If by lunch time you have not had AT LEAST 2 bowel movements take another dose If by dinner you have not had AT LEAST 2 bowel movements that day take another dose If by bedtime you still have not had 2 bowel movements take another dose Goal of 3-4 bowel movements per day Avoid taking with food as this will cause more gas  IF YOU ARE VERY SLEEPY, HARD FOR YOUR FAMILY TO WAKE YOU UP, OR FALLING ASLEEP DURING CONVERSATIONS -INCREASE LACTULOSE /GO TO THE EMERGENCY ROOM  XIFAXAN (rifaximin) An antibiotic that helps limit excess bacteria in the intestine.  The bacteria can cause increased ammonia One 550mg  tablet twice daily  SUPPLEMENTS: multivitamin Zinc  Branch Chain Amino acids (BCAA): 12 grams/day L-Ornithine L-Aspartate (LOLA): 6 grams three times/day ([email or web address]) L-Carnitine  PHYSICAL ACTIVITY It is important to continue to be active when you have cirrhosis. Exercise will help reduce muscle loss and weakness.  DISCUSS REFERRAL FOR PHYSICAL THERAPY WITH YOUR PRIMARY CARE PROVIDER  DIET/NUTRITION FOR CIRRHOSIS NO ALCOHOL YOUR GOALS Evening snack - high protein Supplements between meals to help meet calorie and protein  goal: Boost Ensure Premier Protein Shakes Protein Greek yogurt Fish, chicken (NO RAW OR UNDERCOOKED FISH/SHELLFISH) Avoid pork and red meat Plant  based protein (non-soy)/Vegan: Lentils, Chickpeas, Peanuts (non salted), almonds (non salted), quinoa, chia seeds  Plant based protein supplements (not soy)  Avoid/limit animal based protein supplements: whey, casein 4.  Low sodium (2,000 mg/day) A. Avoid: table salt, canned foods, deli meats, sausages, hot dogs, anything with a long shelf life B. Read nutrition labels and be aware of serving size. Don't go by percent of daily value.    You have been scheduled for an abdominal Paracentesis at [facility] (1st floor of hospital) on 06/07/2024 at 9:30 am. Please arrive at least 30 minutes prior to your appointment time for registration. Should you need to reschedule this appointment for any reason, please call radiology  at [phone],  Due to recent changes in healthcare laws, you may see the results of your imaging and laboratory studies on MyChart before your provider has had a chance to review them.  We understand that in some cases there may be results that are confusing or concerning to you. Not all laboratory results come back in the same time frame and the provider may be waiting for multiple results in order to interpret others.  Please give us  48 hours in order for your provider to thoroughly review all the results before contacting the office for clarification of your results.    I appreciate the  opportunity to care for you  Thank You   [facility]

## 2024-06-07 ENCOUNTER — Other Ambulatory Visit: Payer: Self-pay | Admitting: Physician Assistant

## 2024-06-07 ENCOUNTER — Ambulatory Visit (HOSPITAL_COMMUNITY)
Admission: RE | Admit: 2024-06-07 | Discharge: 2024-06-07 | Disposition: A | Discharge status: Home / Self Care | Source: Ambulatory Visit | Attending: Physician Assistant | Admitting: Physician Assistant

## 2024-06-07 DIAGNOSIS — K7031 Alcoholic cirrhosis of liver with ascites: Secondary | ICD-10-CM | POA: Diagnosis present

## 2024-06-07 DIAGNOSIS — Z9049 Acquired absence of other specified parts of digestive tract: Secondary | ICD-10-CM

## 2024-06-07 DIAGNOSIS — I350 Nonrheumatic aortic (valve) stenosis: Secondary | ICD-10-CM

## 2024-06-07 DIAGNOSIS — D696 Thrombocytopenia, unspecified: Secondary | ICD-10-CM

## 2024-06-07 DIAGNOSIS — K3189 Other diseases of stomach and duodenum: Secondary | ICD-10-CM

## 2024-06-07 DIAGNOSIS — D509 Iron deficiency anemia, unspecified: Secondary | ICD-10-CM

## 2024-06-07 DIAGNOSIS — G621 Alcoholic polyneuropathy: Secondary | ICD-10-CM

## 2024-06-07 DIAGNOSIS — I330 Acute and subacute infective endocarditis: Secondary | ICD-10-CM

## 2024-06-07 MED ORDER — LIDOCAINE HCL 1 % IJ SOLN
INTRAMUSCULAR | Status: AC
  Filled 2024-06-07: qty 20

## 2024-06-08 ENCOUNTER — Ambulatory Visit (INDEPENDENT_AMBULATORY_CARE_PROVIDER_SITE_OTHER): Admitting: Family Medicine

## 2024-06-08 ENCOUNTER — Encounter: Payer: Self-pay | Admitting: Family Medicine

## 2024-06-08 VITALS — BP 119/69 | HR 84 | Ht 63.0 in | Wt 193.0 lb

## 2024-06-08 DIAGNOSIS — Z23 Encounter for immunization: Secondary | ICD-10-CM

## 2024-06-08 DIAGNOSIS — K7031 Alcoholic cirrhosis of liver with ascites: Secondary | ICD-10-CM | POA: Diagnosis not present

## 2024-06-08 NOTE — Addendum Note (Signed)
 Addended by: Lakendra Helling Z on: 06/08/2024 10:40 AM   Modules accepted: Orders

## 2024-06-08 NOTE — Progress Notes (Signed)
 Juan [name] - 58 y.o. male MRN [id number]  Date of birth: 1965-10-23  Subjective Chief Complaint  Patient presents with   Wound Check    HPI Juan [name] is a 58 y.o. male here today for follow up.   History of cirrhosis s/p recent cholecystectomy.  He had some fluid retention and increased ascites post operatively to the point that he was leaking fluids from his wounds .  Restarted on aldactone  and lasix  + potassium.  He has had improvement with this. No further drainage.  He did see GI and was planning to have paracentesis but no visible pockets of fluid to drain noted on US .  GI did also change him to Amiloride due to gynecomastia with aldactone .   ROS:  A comprehensive ROS was completed and negative except as noted per HPI  Allergies  Allergen Reactions   Cucumber Extract Itching and Nausea And Vomiting   Depakote Er [Divalproex Sodium Er] Swelling    Tongue swelling   Depakote [Valproic Acid] Anaphylaxis   Peanut (Diagnostic) Anaphylaxis and Swelling    Reaction to peanut butter, specifically   Peanut Oil Swelling   Shellfish Allergy Itching and Swelling    Shrimp, specifically   Codeine Itching and Rash    OK to take hydrocodone /oxycodone  with no issues   Firvanq  [Vancomycin ] Itching and Other (See Comments)    Redman syndrome after receiving via IV (unknown date, a few years ago) Patient complains of severe itching after being switched to IV formulation on 04/19/24 OK to take tablet formulation, no issues.     Lactose Intolerance (Gi) Diarrhea and Other (See Comments)    Bloating, flatulence Indigestion Stomach pain   Tylenol  [Acetaminophen ] Other (See Comments)    Hx of cirrhosis   Cantaloupe (Diagnostic) Rash    Past Medical History:  Diagnosis Date   Abnormal chest x-ray 04/20/2024   Abnormal finding on GI tract imaging 03/03/2024   Acute bacterial endocarditis 03/07/2024   Acute bacterial sinusitis 11/15/2022   Acute pain of right knee 03/10/2023    AKI (acute kidney injury) 09/11/2023   Alcohol addiction (HCC)    Alcohol use disorder 05/23/2023   Alcoholic cirrhosis of liver with ascites (HCC) 02/17/2022   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  Chronic GERD with HH  09/2019--EGD--[facility], [place]--showed no varices; gr D esophagitis;  Rx: PPI daily     Eustachian tube dysfunction 03/02/2023   Febrile illness 06/27/2023   Fever 06/20/2023   Fracture of laryngeal cartilage (HCC) 01/17/2020   Last Assessment & Plan:   Formatting of this note might be  different from the original.  Emergency department follow-up for evaluation of laryngeal trauma.  CT obtained the night of the injury is reviewed independently and shows nondisplaced fracture of the thyroid  cartilage.  In general, his voice is much improved from the night of the trauma.  Denies any difficulty breathing.  EXAM shows minimal   GERD (gastroesophageal reflux disease)    GI bleed 08/16/2022   Gram-positive bacteremia 02/26/2024   Head injury 10/31/2022    Hematemesis 09/13/2022   Hemochromatosis    Hiatal hernia 02/20/2022   History of alcohol abuse 05/20/2020   History of bilateral inguinal hernia repair 01/19/2019   Hx of blood clots    Leg   Hyperbilirubinemia 08/17/2022   Hyperreflexia    Hypertension    Hypocalcemia 11/02/2020   Hypokalemia 01/11/2022   Hypomagnesemia 01/11/2022   Hyponatremia 03/02/2022   Impingement syndrome, shoulder, right 05/20/2020   Intertrigo 03/20/2024   Laceration of extensor hallucis longus tendon, left, initial encounter 02/06/2017   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 07/11/2020   Lower extremity edema 09/06/2022   Lumbar degenerative disc disease 07/26/2022   Malnutrition of moderate degree 01/11/2022   MDD (major depressive disorder), recurrent severe, 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   Moderate aortic stenosis 11/17/2023   Nonrheumatic aortic valve stenosis    Normocytic anemia 03/02/2022   Obesity (BMI 30.0-34.9) 11/17/2023   Pain of left calf 04/11/2023   Pancytopenia (HCC) 09/18/2021   Pneumonia 04/20/2024   Portal hypertensive gastropathy (HCC)    Post-traumatic osteoarthritis of right knee 01/04/2023   Prolonged QT interval 05/05/2022   PTSD (post-traumatic stress disorder)    Rib pain on right side 10/31/2022   Right ankle sprain 02/12/2021   Right lower quadrant pain 04/17/2021   Right wrist injury 10/18/2023   Right wrist pain 01/23/2024   Sepsis (HCC) 02/15/2024   Shoulder pain, left, posterior 12/22/2021   Sleep apnea    Substance abuse (HCC)    Systolic murmur 01/06/2021   Thrombocytopenia 11/02/2020   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) 01/23/2024   Tibialis posterior tendinitis, right 04/14/2021   Tinea pedis 05/12/2021   Tobacco chew use 03/02/2022   Traumatic hemorrhagic shock (HCC)    Urinary frequency 03/23/2021   Well adult exam 01/06/2021    Past Surgical History:  Procedure Laterality Date   BIOPSY  09/24/2021    Procedure: BIOPSY;  Surgeon: Federico Rosario BROCKS, MD;  Location: [facility] ENDOSCOPY;  Service: Gastroenterology;;   ORIN MEDIATE RELEASE Bilateral    COLONOSCOPY WITH PROPOFOL  N/A 09/24/2021   Procedure: COLONOSCOPY WITH PROPOFOL ;  Surgeon: Federico Rosario BROCKS, MD;  Location: [facility] ENDOSCOPY;  Service: Gastroenterology;  Laterality: N/A;   ESOPHAGOGASTRODUODENOSCOPY N/A 03/06/2024   Procedure: EGD (ESOPHAGOGASTRODUODENOSCOPY);  Surgeon: Suzann Inocente HERO, MD;  Location: [facility] ENDOSCOPY;  Service: Gastroenterology;  Laterality: N/A;   ESOPHAGOGASTRODUODENOSCOPY (EGD) WITH PROPOFOL  N/A 09/24/2021   Procedure: ESOPHAGOGASTRODUODENOSCOPY (EGD) WITH PROPOFOL ;  Surgeon: Federico Rosario BROCKS, MD;  Location[facility] ENDOSCOPY;  Service: Gastroenterology;  Laterality: N/A;   ESOPHAGOGASTRODUODENOSCOPY (EGD) WITH PROPOFOL  N/A 08/18/2022   Procedure: ESOPHAGOGASTRODUODENOSCOPY (EGD) WITH PROPOFOL ;  Surgeon: Abran Norleen SAILOR, MD;  Location: [facility] ENDOSCOPY;  Service: Gastroenterology;  Laterality: N/A;   FLEXIBLE SIGMOIDOSCOPY N/A 08/18/2022   Procedure: FLEXIBLE SIGMOIDOSCOPY;  Surgeon: Abran Norleen SAILOR, MD;  Location: [facility] ENDOSCOPY;  Service: Gastroenterology;  Laterality: N/A;   FRACTURE SURGERY     left ankle plate   HERNIA REPAIR     inguinal   KNEE SURGERY Right  x 4   SHOULDER SURGERY Bilateral    x 2   TRANSESOPHAGEAL ECHOCARDIOGRAM (CATH LAB) N/A 03/07/2024   Procedure: TRANSESOPHAGEAL ECHOCARDIOGRAM;  Surgeon: Shlomo Wilbert SAUNDERS, MD;  Location: [facility] INVASIVE CV LAB;  Service: Cardiovascular;  Laterality: N/A;   VASECTOMY      Social History   Socioeconomic History   Marital status: Married    Spouse name: [name]   Number of children: 2   Years of education: Not on file   Highest education level: 12th grade  Occupational History    Comment: disability  Tobacco Use   Smoking status: Never   Smokeless tobacco: Current    Types: Snuff  Vaping Use   Vaping status: Never Used  Substance and Sexual Activity   Alcohol use: Not  Currently   Drug use: Never   Sexual activity: Yes    Partners: Female  Other Topics Concern   Not on file  Social History Narrative   Lives with wife   Social Drivers of Health   Financial Resource Strain: Medium Risk (06/05/2024)   Overall Financial Resource Strain (CARDIA)    Difficulty of Paying Living Expenses: Somewhat hard  Food Insecurity: No Food Insecurity (06/05/2024)   Hunger Vital Sign    Worried About Running Out of Food in the Last Year: Never true    Ran Out of Food in the Last Year: Never true  Transportation Needs: No Transportation Needs (06/05/2024)   PRAPARE - Administrator, Civil Service (Medical): No    Lack of Transportation (Non-Medical): No  Physical Activity: Insufficiently Active (06/05/2024)   Exercise Vital Sign    Days of Exercise per Week: 1 day    Minutes of Exercise per Session: 10 min  Stress: Stress Concern Present (06/05/2024)   [employer] of Occupational Health - Occupational Stress Questionnaire    Feeling of Stress: To some extent  Social Connections: Unknown (06/05/2024)   Social Connection and Isolation Panel    Frequency of Communication with Friends and Family: Three times a week    Frequency of Social Gatherings with Friends and Family: Once a week    Attends Religious Services: Not on Marketing executive or Organizations: Yes    Attends Banker Meetings: Not on file    Marital Status: Married    Family History  Problem Relation Age of Onset   Pulmonary fibrosis Mother    Hypertension Father    Other Father        liver failure   Diabetes Brother    Breast cancer Paternal Aunt    Colon cancer Neg Hx    Esophageal cancer Neg Hx    Stomach cancer Neg Hx    Rectal cancer Neg Hx     Health Maintenance  Topic Date Due   Hepatitis B Vaccines 19-59 Average Risk (2 of 3 - 19+ 3-dose series) 12/02/2022   COVID-19 Vaccine (4 - 2025-26 season) 04/23/2024   Influenza Vaccine   11/20/2024 (Originally 03/23/2024)   Medicare Annual Wellness (AWV)  04/03/2025   DTaP/Tdap/Td (3 - Td or Tdap) 06/20/2029   Colonoscopy  09/25/2031   Pneumococcal Vaccine: 50+ Years  Completed   Hepatitis C Screening  Completed   HIV Screening  Completed   Zoster Vaccines- Shingrix  Completed   HPV VACCINES  Aged Out   Meningococcal B Vaccine  Aged Out     ----------------------------------------------------------------------------------------------------------------------------------------------------------------------------------------------------------------- Physical Exam BP 119/69 (BP Location: Left Arm, Patient Position: Sitting,  Cuff Size: Normal)   Pulse 84   Ht 5' 3 (1.6 m)   Wt 193 lb (87.5 kg)   SpO2 100%   BMI 34.19 kg/m   Physical Exam Constitutional:      Appearance: Normal appearance.  HENT:     Head: Normocephalic and atraumatic.  Eyes:     General: No scleral icterus. Cardiovascular:     Rate and Rhythm: Normal rate and regular rhythm.  Pulmonary:     Effort: Pulmonary effort is normal.     Breath sounds: Normal breath sounds.  Musculoskeletal:     Cervical back: Neck supple.  Neurological:     Mental Status: He is alert.  Psychiatric:        Mood and Affect: Mood normal.        Behavior: Behavior normal.     ------------------------------------------------------------------------------------------------------------------------------------------------------------------------------------------------------------------- Assessment and Plan  Alcoholic cirrhosis of liver with ascites (HCC) Fluid improved and less distended.  Continue amiloride and will reduce lasix  to every other day.  Can increase to daily if having increased weight gain/fluid. Continue potassium and magnesium .     No orders of the defined types were placed in this encounter.   No follow-ups on file.

## 2024-06-08 NOTE — Assessment & Plan Note (Signed)
 Fluid improved and less distended.  Continue amiloride and will reduce lasix  to every other day.  Can increase to daily if having increased weight gain/fluid. Continue potassium and magnesium .

## 2024-06-09 LAB — CMP14+EGFR
ALT: 21 IU/L (ref 0–44)
AST: 59 IU/L — ABNORMAL HIGH (ref 0–40)
Albumin: 2.8 g/dL — ABNORMAL LOW (ref 3.8–4.9)
Alkaline Phosphatase: 114 IU/L (ref 47–123)
BUN/Creatinine Ratio: 6 — ABNORMAL LOW (ref 9–20)
BUN: 7 mg/dL (ref 6–24)
Bilirubin Total: 3.2 mg/dL — ABNORMAL HIGH (ref 0.0–1.2)
CO2: 20 mmol/L (ref 20–29)
Calcium: 8.6 mg/dL — ABNORMAL LOW (ref 8.7–10.2)
Chloride: 109 mmol/L — ABNORMAL HIGH (ref 96–106)
Creatinine, Ser: 1.08 mg/dL (ref 0.76–1.27)
Globulin, Total: 3 g/dL (ref 1.5–4.5)
Glucose: 90 mg/dL (ref 70–99)
Potassium: 3.5 mmol/L (ref 3.5–5.2)
Sodium: 144 mmol/L (ref 134–144)
Total Protein: 5.8 g/dL — ABNORMAL LOW (ref 6.0–8.5)
eGFR: 81 mL/min/1.73 (ref >59)

## 2024-06-09 LAB — MAGNESIUM: Magnesium: 1.2 mg/dL — ABNORMAL LOW (ref 1.6–2.3)

## 2024-06-15 ENCOUNTER — Ambulatory Visit: Payer: Self-pay | Admitting: Family Medicine

## 2024-07-01 DIAGNOSIS — K219 Gastro-esophageal reflux disease without esophagitis: Secondary | ICD-10-CM | POA: Diagnosis not present

## 2024-07-01 DIAGNOSIS — G621 Alcoholic polyneuropathy: Secondary | ICD-10-CM | POA: Diagnosis not present

## 2024-07-01 DIAGNOSIS — E669 Obesity, unspecified: Secondary | ICD-10-CM | POA: Diagnosis not present

## 2024-07-01 DIAGNOSIS — F319 Bipolar disorder, unspecified: Secondary | ICD-10-CM | POA: Diagnosis not present

## 2024-07-01 DIAGNOSIS — K7030 Alcoholic cirrhosis of liver without ascites: Secondary | ICD-10-CM | POA: Diagnosis not present

## 2024-07-01 DIAGNOSIS — I10 Essential (primary) hypertension: Secondary | ICD-10-CM | POA: Diagnosis not present

## 2024-07-01 DIAGNOSIS — F1021 Alcohol dependence, in remission: Secondary | ICD-10-CM | POA: Diagnosis not present

## 2024-07-01 DIAGNOSIS — K766 Portal hypertension: Secondary | ICD-10-CM | POA: Diagnosis not present

## 2024-07-01 DIAGNOSIS — F411 Generalized anxiety disorder: Secondary | ICD-10-CM | POA: Diagnosis not present

## 2024-07-16 ENCOUNTER — Ambulatory Visit: Payer: Self-pay | Admitting: Urgent Care

## 2024-07-16 ENCOUNTER — Ambulatory Visit (INDEPENDENT_AMBULATORY_CARE_PROVIDER_SITE_OTHER)

## 2024-07-16 ENCOUNTER — Ambulatory Visit (INDEPENDENT_AMBULATORY_CARE_PROVIDER_SITE_OTHER): Admitting: Urgent Care

## 2024-07-16 ENCOUNTER — Encounter: Payer: Self-pay | Admitting: Urgent Care

## 2024-07-16 VITALS — BP 136/74 | HR 82 | Ht 63.0 in | Wt 191.0 lb

## 2024-07-16 DIAGNOSIS — E876 Hypokalemia: Secondary | ICD-10-CM | POA: Diagnosis not present

## 2024-07-16 DIAGNOSIS — R0789 Other chest pain: Secondary | ICD-10-CM

## 2024-07-16 DIAGNOSIS — I350 Nonrheumatic aortic (valve) stenosis: Secondary | ICD-10-CM | POA: Diagnosis not present

## 2024-07-16 DIAGNOSIS — E875 Hyperkalemia: Secondary | ICD-10-CM

## 2024-07-16 DIAGNOSIS — S8012XA Contusion of left lower leg, initial encounter: Secondary | ICD-10-CM

## 2024-07-16 DIAGNOSIS — R600 Localized edema: Secondary | ICD-10-CM | POA: Diagnosis not present

## 2024-07-16 DIAGNOSIS — R631 Polydipsia: Secondary | ICD-10-CM | POA: Diagnosis not present

## 2024-07-16 DIAGNOSIS — D696 Thrombocytopenia, unspecified: Secondary | ICD-10-CM

## 2024-07-16 NOTE — Progress Notes (Signed)
 Established Patient Office Visit  Subjective:  Patient ID: Juan [name], male    DOB: 05-May-1966  Age: 58 y.o. MRN: [id number]  Chief Complaint  Patient presents with   Fall   Bleeding/Bruising   Chest Pain    HPI  Discussed the use of AI scribe software for clinical note transcription with the patient, who gave verbal consent to proceed.  History of Present Illness   Juan [name] is a 58 year old male with cirrhosis and aortic valve issues who presents with pain and bruising following a fall.  He experienced a fall at home when going to the bathroom late at night. Without his scooter, he was tripped by his pets, leading to a fall where his chest hit the sink, and he subsequently hit the wall and floor. This incident occurred more than 24 hours ago, possibly on Saturday. He denies hitting his head and remained fully conscious throughout the event.  He experiences significant pain, particularly when leaning to the left, and notes a large bruise on his chest. The pain is rated as 7 to 8 out of 10, primarily located where the bruising is. He is not currently taking any medication for the pain and has not used any topical treatments. He is not on blood thinners.  Since the fall, he has noticed swelling in his leg, which he attributes to a hematoma. He has not been taking his water pills (Lasix ) due to the inconvenience of frequent urination at night, although taking them in the morning does not cause this issue. He has a history of edema, which is more pronounced on the side of the fall.  His past medical history includes cirrhosis and aortic valve issues, for which he is under cardiology care. He had gallbladder surgery on October 3rd and has a history of multiple surgeries including shoulder, knee, ankle, and hernia repairs. He also experiences constant thirst, consuming several drinks throughout the day, and would like to check for diabetes. He is on magnesium  replacement therapy and  avoids high sodium drinks.  No dizziness or lightheadedness at the time of the fall and no internal hip pain. He has a history of sleep apnea but is not currently using a CPAP machine. He experiences constipation and follows a low-salt diet with fruits and vegetables.       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   Bacterial endocarditis 04/21/2024   Pneumonia 04/20/2024   Abnormal chest x-ray 04/20/2024   Cholelithiasis 04/20/2024   Endocarditis, valve unspecified 03/20/2024   Intertrigo 03/20/2024   Acute bacterial endocarditis 03/07/2024   Abnormal finding on GI tract imaging 03/03/2024   Gram-positive bacteremia 02/26/2024   Sepsis (HCC) 02/15/2024   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) 01/23/2024   Right wrist pain 01/23/2024   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   Moderate aortic stenosis 11/17/2023   Obesity (BMI 30.0-34.9) 11/17/2023   Alcohol addiction (HCC)    Cirrhosis (HCC)    Colon polyps    Depression    Hemochromatosis    Hx of blood clots    Hyperreflexia    Hypertension    Traumatic hemorrhagic shock (HCC)    Right wrist injury 10/18/2023   AKI (acute kidney injury) 09/11/2023  Febrile illness 06/27/2023   Fever 06/20/2023   Bipolar 2 disorder, major depressive episode (HCC) 05/23/2023   Alcohol use disorder 05/23/2023   Arthritis 05/23/2023   Pain of left calf 04/11/2023   Chest trauma 03/18/2023   Acute pain of right knee 03/10/2023   Eustachian tube dysfunction 03/02/2023   Breast nodule 01/12/2023   Post-traumatic osteoarthritis of right knee 01/04/2023   Acute bacterial sinusitis 11/15/2022   Rib pain on right side 10/31/2022   Head injury 10/31/2022   Bilateral lower extremity edema 10/12/2022    Epistaxis 09/14/2022   Anasarca 09/14/2022   Hematemesis 09/13/2022   Lower extremity edema 09/06/2022   Hyperbilirubinemia 08/17/2022   Anxiety 08/17/2022   GI bleed 08/16/2022   Lumbar degenerative disc disease 07/26/2022   Prolonged QT interval 05/05/2022   Anemia, chronic disease 05/05/2022   Hyponatremia 03/02/2022   GERD (gastroesophageal reflux disease) 03/02/2022   Normocytic anemia 03/02/2022   Tobacco chew use 03/02/2022   Hiatal hernia 02/20/2022   Alcoholic cirrhosis of liver with ascites (HCC) 02/17/2022   DDD (degenerative disc disease), cervical 01/22/2022   Hypokalemia 01/11/2022   Hypomagnesemia 01/11/2022   Malnutrition of moderate degree 01/11/2022   Nonrheumatic aortic valve stenosis    Shoulder pain, left, posterior 12/22/2021   Portal hypertensive gastropathy (HCC)    Pancytopenia (HCC) 09/18/2021   Tinea pedis 05/12/2021   Diverticulosis 04/20/2021   Right lower quadrant pain 04/17/2021   Tibialis posterior tendinitis, right 04/14/2021   Dizziness 03/23/2021   Urinary frequency 03/23/2021   Right ankle sprain 02/12/2021   Well adult exam 01/06/2021   Systolic murmur 01/06/2021   Hypocalcemia 11/02/2020   Thrombocytopenia 11/02/2020   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 07/11/2020   Allergic reaction 07/08/2020   History of alcohol abuse 05/20/2020   Impingement syndrome, shoulder, right 05/20/2020   Fracture of laryngeal cartilage (HCC) 01/17/2020   Esophagitis, [place] grade D 12/05/2019   PTSD (post-traumatic stress disorder) 04/16/2019   MDD (major depressive disorder), recurrent severe, 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   History of bilateral inguinal hernia repair 01/19/2019   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   Laceration of extensor hallucis longus tendon, left, initial encounter 02/06/2017   Chronic fatigue 12/21/2016   Benign essential hypertension 12/21/2016   Past Medical History:  Diagnosis Date   Abnormal chest  x-ray 04/20/2024   Abnormal finding on GI tract imaging 03/03/2024   Acute bacterial endocarditis 03/07/2024   Acute bacterial sinusitis 11/15/2022   Acute pain of right knee 03/10/2023   AKI (acute kidney injury) 09/11/2023   Alcohol addiction (HCC)    Alcohol use disorder 05/23/2023   Alcoholic cirrhosis of liver with ascites (HCC) 02/17/2022   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  Chronic GERD with HH  09/2019--EGD--[facility], [place]--showed no varices; gr D esophagitis;  Rx: PPI daily     Eustachian tube dysfunction 03/02/2023   Febrile illness 06/27/2023   Fever 06/20/2023   Fracture of laryngeal cartilage (HCC) 01/17/2020   Last Assessment & Plan:   Formatting of this note might be different from the original.  Emergency department follow-up for evaluation of laryngeal trauma.  CT obtained the night of the injury is reviewed independently and shows nondisplaced fracture of the thyroid  cartilage.  In general, his voice is much improved from the night of the trauma.   Denies any difficulty breathing.  EXAM shows minimal   GERD (gastroesophageal reflux disease)    GI bleed 08/16/2022   Gram-positive bacteremia 02/26/2024   Head injury 10/31/2022   Hematemesis 09/13/2022   Hemochromatosis    Hiatal hernia 02/20/2022   History of alcohol abuse 05/20/2020   History of bilateral inguinal hernia repair 01/19/2019   Hx of blood clots    Leg   Hyperbilirubinemia 08/17/2022   Hyperreflexia    Hypertension    Hypocalcemia 11/02/2020   Hypokalemia 01/11/2022   Hypomagnesemia 01/11/2022   Hyponatremia 03/02/2022   Impingement syndrome, shoulder, right 05/20/2020   Intertrigo 03/20/2024   Laceration of extensor hallucis longus tendon, left, initial encounter 02/06/2017   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 07/11/2020   Lower extremity edema 09/06/2022   Lumbar degenerative disc disease 07/26/2022   Malnutrition of moderate degree 01/11/2022   MDD (major depressive disorder), recurrent severe, 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   Moderate aortic stenosis 11/17/2023   Nonrheumatic aortic valve stenosis    Normocytic anemia 03/02/2022   Obesity (BMI 30.0-34.9) 11/17/2023   Pain of left calf 04/11/2023   Pancytopenia (HCC) 09/18/2021   Pneumonia 04/20/2024   Portal hypertensive gastropathy (HCC)    Post-traumatic osteoarthritis of right knee 01/04/2023   Prolonged QT interval 05/05/2022   PTSD (post-traumatic stress disorder)    Rib pain on right side 10/31/2022   Right ankle sprain 02/12/2021   Right lower quadrant pain 04/17/2021   Right wrist injury 10/18/2023   Right wrist pain 01/23/2024   Sepsis (HCC) 02/15/2024   Shoulder pain, left, posterior 12/22/2021   Sleep apnea    Substance abuse (HCC)    Systolic murmur 01/06/2021   Thrombocytopenia 11/02/2020   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) 01/23/2024   Tibialis posterior tendinitis, right 04/14/2021   Tinea pedis 05/12/2021   Tobacco chew use  03/02/2022   Traumatic hemorrhagic shock (HCC)    Urinary frequency 03/23/2021   Well adult exam 01/06/2021   Past Surgical History:  Procedure Laterality Date   BIOPSY  09/24/2021   Procedure: BIOPSY;  Surgeon: Federico Rosario BROCKS, MD;  Location: [facility] ENDOSCOPY;  Service: Gastroenterology;;   ORIN MEDIATE RELEASE Bilateral    COLONOSCOPY WITH PROPOFOL  N/A 09/24/2021   Procedure: COLONOSCOPY WITH PROPOFOL ;  Surgeon: Federico Rosario BROCKS, MD;  Location: [facility] ENDOSCOPY;  Service: Gastroenterology;  Laterality: N/A;   ESOPHAGOGASTRODUODENOSCOPY N/A 03/06/2024   Procedure: EGD (ESOPHAGOGASTRODUODENOSCOPY);  Surgeon: Suzann Inocente HERO, MD;  Location: [facility] ENDOSCOPY;  Service: Gastroenterology;  Laterality: N/A;   ESOPHAGOGASTRODUODENOSCOPY (EGD) WITH PROPOFOL  N/A 09/24/2021   Procedure: ESOPHAGOGASTRODUODENOSCOPY (EGD) WITH PROPOFOL ;  Surgeon: Federico Rosario BROCKS, MD;  Location: [facility] ENDOSCOPY;  Service: Gastroenterology;  Laterality: N/A;   ESOPHAGOGASTRODUODENOSCOPY (  EGD) WITH PROPOFOL  N/A 08/18/2022   Procedure: ESOPHAGOGASTRODUODENOSCOPY (EGD) WITH PROPOFOL ;  Surgeon: Abran Norleen SAILOR, MD;  Location: [facility] ENDOSCOPY;  Service: Gastroenterology;  Laterality: N/A;   FLEXIBLE SIGMOIDOSCOPY N/A 08/18/2022   Procedure: FLEXIBLE SIGMOIDOSCOPY;  Surgeon: Abran Norleen SAILOR, MD;  Location: [facility] ENDOSCOPY;  Service: Gastroenterology;  Laterality: N/A;   FRACTURE SURGERY     left ankle plate   HERNIA REPAIR     inguinal   KNEE SURGERY Right    x 4   SHOULDER SURGERY Bilateral    x 2   TRANSESOPHAGEAL ECHOCARDIOGRAM (CATH LAB) N/A 03/07/2024   Procedure: TRANSESOPHAGEAL ECHOCARDIOGRAM;  Surgeon: Shlomo Wilbert SAUNDERS, MD;  Location: [facility] INVASIVE CV LAB;  Service: Cardiovascular;  Laterality: N/A;   VASECTOMY     Social History   Tobacco Use   Smoking status: Never   Smokeless tobacco: Current    Types: Snuff  Vaping Use   Vaping status: Never Used  Substance Use Topics   Alcohol use: Not Currently   Drug use: Never      ROS: as noted  in HPI  Objective:     BP 136/74 (BP Location: Left Arm, Patient Position: Sitting, Cuff Size: Normal)   Pulse 82   Ht 5' 3 (1.6 m)   Wt 191 lb (86.6 kg)   SpO2 96%   BMI 33.83 kg/m  BP Readings from Last 3 Encounters:  07/16/24 136/74  06/08/24 119/69  06/06/24 134/74   Wt Readings from Last 3 Encounters:  07/16/24 191 lb (86.6 kg)  06/08/24 193 lb (87.5 kg)  06/06/24 196 lb 2 oz (89 kg)      Physical Exam Vitals and nursing note reviewed.  Constitutional:      General: He is not in acute distress.    Appearance: Normal appearance. He is not toxic-appearing or diaphoretic.  HENT:     Head: Normocephalic and atraumatic.     Right Ear: External ear normal.     Left Ear: External ear normal.     Nose: Nose normal.  Eyes:     General: No scleral icterus.    Pupils: Pupils are equal, round, and reactive to light.  Cardiovascular:     Rate and Rhythm: Normal rate.     Heart sounds: Murmur heard.  Pulmonary:     Effort: Pulmonary effort is normal. No respiratory distress.  Chest:     Chest wall: Tenderness present. No mass or deformity.    Abdominal:     Comments: Healing laparotomy incisions  Musculoskeletal:     Right lower leg: Edema present.     Left lower leg: Edema present.       Legs:     Comments: Pitting edema noted ankle to knee; 2-3+ bilaterally  Skin:    General: Skin is warm and dry.     Findings: No erythema or rash.  Neurological:     General: No focal deficit present.     Mental Status: He is alert and oriented to person, place, and time.     Gait: Gait abnormal (pt using rollator).      No results found for any visits on 07/16/24.  Last CBC Lab Results  Component Value Date   WBC 5.7 06/01/2024   HGB 8.3 (L) 06/01/2024   HCT 23.5 (L) 06/01/2024   MCV 87 06/01/2024   MCH 30.9 06/01/2024   RDW 15.8 (H) 06/01/2024   PLT 72 (LL) 06/01/2024   Last metabolic panel Lab Results  Component Value  Date   GLUCOSE 90 06/08/2024   NA 144  06/08/2024   K 3.5 06/08/2024   CL 109 (H) 06/08/2024   CO2 20 06/08/2024   BUN 7 06/08/2024   CREATININE 1.08 06/08/2024   EGFR 81 06/08/2024   CALCIUM  8.6 (L) 06/08/2024   PHOS 3.7 04/25/2024   PROT 5.8 (L) 06/08/2024   ALBUMIN 2.8 (L) 06/08/2024   LABGLOB 3.0 06/08/2024   AGRATIO 0.6 (L) 10/29/2022   BILITOT 3.2 (H) 06/08/2024   ALKPHOS 114 06/08/2024   AST 59 (H) 06/08/2024   ALT 21 06/08/2024   ANIONGAP 9 04/25/2024      The 10-year ASCVD risk score (Arnett DK, et al., 2019) is: 7.8%  Assessment & Plan:  Polydipsia  Hypokalemia -     CMP14+EGFR -     Magnesium   Hypomagnesemia -     Magnesium   Sternal pain -     DG Chest 2 View; Future -     CBC with Differential/Platelet  Leg hematoma, left, initial encounter -     CBC with Differential/Platelet  Peripheral edema -     Brain natriuretic peptide -     CMP14+EGFR  Nonrheumatic aortic valve stenosis  Assessment and Plan    Contusion and hematoma of left lower leg with localized edema Contusion and hematoma with significant bruising and edema. Pain worsens with touch and movement. No anticoagulant use. Edema likely due to lack of diuretic. - Recommended pulsatile shower pressure for hematoma resolution. - Advised knee compression stockings to manage edema. - Encouraged resumption of Lasix  in the morning.  Chest wall contusion Significant pain rated 7-8/10, especially when leaning left. No head injury or loss of consciousness. Lungs clear. Concern for severity above bruising area. - Advised against NSAIDs and Tylenol  due to interactions and limited efficacy. - OTC lidocaine  patches safe for patients comorbidities  Aortic valve disorder under cardiology monitoring Aortic valve disorder monitored by cardiology. No surgical intervention due to cirrhosis. No heart failure symptoms. - Continue cardiology monitoring. - Checked BNP to rule out cardiac involvement in edema.  Cirrhosis of liver Cirrhosis.  Gallbladder removal without complications.  Polydipsia Reports frequent thirst and fluid intake. Possible medication and dietary factors. No recent A1c check. - Ordered A1c to evaluate for diabetes or metabolic causes. - Advised reduction of sport drink intake due to sodium content.  Hypomagnesemia Low magnesium  levels noted. On magnesium  replacement therapy. - Checked magnesium  levels to assess current status.         No follow-ups on file.   Benton LITTIE Gave, PA

## 2024-07-16 NOTE — Patient Instructions (Signed)
 Please go to [address] to get xray of your chest. We drew labs today.  Please use topical voltaren  gel to your chest for now. Use a pulsatile shower head on your left leg to help break up the hematoma. A warm epsom salt bath may also help.

## 2024-07-18 ENCOUNTER — Other Ambulatory Visit: Payer: Self-pay | Admitting: Family Medicine

## 2024-07-18 ENCOUNTER — Other Ambulatory Visit (HOSPITAL_BASED_OUTPATIENT_CLINIC_OR_DEPARTMENT_OTHER): Payer: Self-pay

## 2024-07-18 LAB — CBC WITH DIFFERENTIAL/PLATELET
Basophils Absolute: 0.1 x10E3/uL (ref 0.0–0.2)
Basos: 2 %
EOS (ABSOLUTE): 0.3 x10E3/uL (ref 0.0–0.4)
Eos: 7 %
Hematocrit: 26.7 % — ABNORMAL LOW (ref 37.5–51.0)
Hemoglobin: 8.6 g/dL — ABNORMAL LOW (ref 13.0–17.7)
Immature Grans (Abs): 0 x10E3/uL (ref 0.0–0.1)
Immature Granulocytes: 0 %
Lymphocytes Absolute: 2.1 x10E3/uL (ref 0.7–3.1)
Lymphs: 47 %
MCH: 27.7 pg (ref 26.6–33.0)
MCHC: 32.2 g/dL (ref 31.5–35.7)
MCV: 86 fL (ref 79–97)
Monocytes Absolute: 0.6 x10E3/uL (ref 0.1–0.9)
Monocytes: 13 %
Neutrophils Absolute: 1.4 x10E3/uL (ref 1.4–7.0)
Neutrophils: 31 %
Platelets: 47 x10E3/uL — CL (ref 150–450)
RBC: 3.1 x10E6/uL — ABNORMAL LOW (ref 4.14–5.80)
RDW: 18.7 % — ABNORMAL HIGH (ref 11.6–15.4)
WBC: 4.5 x10E3/uL (ref 3.4–10.8)

## 2024-07-18 LAB — CMP14+EGFR
ALT: 41 IU/L (ref 0–44)
AST: 160 IU/L — ABNORMAL HIGH (ref 0–40)
Albumin: 2.8 g/dL — ABNORMAL LOW (ref 3.8–4.9)
Alkaline Phosphatase: 104 IU/L (ref 47–123)
BUN/Creatinine Ratio: 6 — ABNORMAL LOW (ref 9–20)
BUN: 6 mg/dL (ref 6–24)
Bilirubin Total: 4.2 mg/dL — ABNORMAL HIGH (ref 0.0–1.2)
CO2: 21 mmol/L (ref 20–29)
Calcium: 7.5 mg/dL — ABNORMAL LOW (ref 8.7–10.2)
Chloride: 102 mmol/L (ref 96–106)
Creatinine, Ser: 0.94 mg/dL (ref 0.76–1.27)
Globulin, Total: 3.6 g/dL (ref 1.5–4.5)
Glucose: 83 mg/dL (ref 70–99)
Potassium: 2.8 mmol/L — ABNORMAL LOW (ref 3.5–5.2)
Sodium: 139 mmol/L (ref 134–144)
Total Protein: 6.4 g/dL (ref 6.0–8.5)
eGFR: 95 mL/min/1.73 (ref >59)

## 2024-07-18 LAB — BRAIN NATRIURETIC PEPTIDE: BNP: 66.5 pg/mL (ref 0.0–100.0)

## 2024-07-18 LAB — MAGNESIUM: Magnesium: 1.1 mg/dL — ABNORMAL LOW (ref 1.6–2.3)

## 2024-07-18 LAB — HGB A1C W/O EAG: Hgb A1c MFr Bld: 4.4 % — AB (ref 4.8–5.6)

## 2024-07-18 LAB — SPECIMEN STATUS REPORT

## 2024-07-18 MED ORDER — FUROSEMIDE 20 MG PO TABS
20.0000 mg | ORAL_TABLET | Freq: Every day | ORAL | 3 refills | Status: AC
  Filled 2024-07-18: qty 30, 30d supply, fill #0

## 2024-07-19 LAB — CBC WITH DIFFERENTIAL/PLATELET
Basophils Absolute: 0.1 x10E3/uL (ref 0.0–0.2)
Basos: 2 %
EOS (ABSOLUTE): 0.3 x10E3/uL (ref 0.0–0.4)
Eos: 5 %
Hematocrit: 25.9 % — ABNORMAL LOW (ref 37.5–51.0)
Hemoglobin: 8.3 g/dL — ABNORMAL LOW (ref 13.0–17.7)
Immature Grans (Abs): 0 x10E3/uL (ref 0.0–0.1)
Immature Granulocytes: 0 %
Lymphocytes Absolute: 1.8 x10E3/uL (ref 0.7–3.1)
Lymphs: 37 %
MCH: 27 pg (ref 26.6–33.0)
MCHC: 32 g/dL (ref 31.5–35.7)
MCV: 84 fL (ref 79–97)
Monocytes Absolute: 0.7 x10E3/uL (ref 0.1–0.9)
Monocytes: 14 %
Neutrophils Absolute: 2 x10E3/uL (ref 1.4–7.0)
Neutrophils: 42 %
Platelets: 46 x10E3/uL — CL (ref 150–450)
RBC: 3.07 x10E6/uL — ABNORMAL LOW (ref 4.14–5.80)
RDW: 18.4 % — ABNORMAL HIGH (ref 11.6–15.4)
WBC: 4.8 x10E3/uL (ref 3.4–10.8)

## 2024-07-19 LAB — MAGNESIUM: Magnesium: 1 mg/dL — ABNORMAL LOW (ref 1.6–2.3)

## 2024-07-19 LAB — POTASSIUM: Potassium: 3.3 mmol/L — ABNORMAL LOW (ref 3.5–5.2)

## 2024-07-21 ENCOUNTER — Ambulatory Visit: Payer: Self-pay | Admitting: Urgent Care

## 2024-07-23 ENCOUNTER — Emergency Department (HOSPITAL_BASED_OUTPATIENT_CLINIC_OR_DEPARTMENT_OTHER): Admission: EM | Admit: 2024-07-23 | Discharge: 2024-07-23 | Disposition: A | Discharge status: Home / Self Care

## 2024-07-23 ENCOUNTER — Other Ambulatory Visit: Payer: Self-pay

## 2024-07-23 ENCOUNTER — Other Ambulatory Visit (HOSPITAL_BASED_OUTPATIENT_CLINIC_OR_DEPARTMENT_OTHER): Payer: Self-pay

## 2024-07-23 ENCOUNTER — Encounter (HOSPITAL_BASED_OUTPATIENT_CLINIC_OR_DEPARTMENT_OTHER): Payer: Self-pay | Admitting: Emergency Medicine

## 2024-07-23 DIAGNOSIS — Z91010 Allergy to peanuts: Secondary | ICD-10-CM | POA: Diagnosis not present

## 2024-07-23 DIAGNOSIS — R531 Weakness: Secondary | ICD-10-CM | POA: Diagnosis not present

## 2024-07-23 LAB — COMPREHENSIVE METABOLIC PANEL WITH GFR
ALT: 41 U/L (ref 0–44)
AST: 104 U/L — ABNORMAL HIGH (ref 15–41)
Albumin: 2.7 g/dL — ABNORMAL LOW (ref 3.5–5.0)
Alkaline Phosphatase: 107 U/L (ref 38–126)
Anion gap: 9 (ref 5–15)
BUN: 7 mg/dL (ref 6–20)
CO2: 20 mmol/L — ABNORMAL LOW (ref 22–32)
Calcium: 8.2 mg/dL — ABNORMAL LOW (ref 8.9–10.3)
Chloride: 105 mmol/L (ref 98–111)
Creatinine, Ser: 0.99 mg/dL (ref 0.61–1.24)
GFR, Estimated: >60 mL/min (ref >60)
Glucose, Bld: 100 mg/dL — ABNORMAL HIGH (ref 70–99)
Potassium: 3.9 mmol/L (ref 3.5–5.1)
Sodium: 134 mmol/L — ABNORMAL LOW (ref 135–145)
Total Bilirubin: 6.5 mg/dL — ABNORMAL HIGH (ref 0.0–1.2)
Total Protein: 6.1 g/dL — ABNORMAL LOW (ref 6.5–8.1)

## 2024-07-23 LAB — CBC
HCT: 26.7 % — ABNORMAL LOW (ref 39.0–52.0)
Hemoglobin: 8.6 g/dL — ABNORMAL LOW (ref 13.0–17.0)
MCH: 28.7 pg (ref 26.0–34.0)
MCHC: 32.2 g/dL (ref 30.0–36.0)
MCV: 89 fL (ref 80.0–100.0)
Platelets: 49 K/uL — ABNORMAL LOW (ref 150–400)
RBC: 3 MIL/uL — ABNORMAL LOW (ref 4.22–5.81)
RDW: 23.1 % — ABNORMAL HIGH (ref 11.5–15.5)
WBC: 4.2 K/uL (ref 4.0–10.5)
nRBC: 0 % (ref 0.0–0.2)

## 2024-07-23 LAB — URINALYSIS, ROUTINE W REFLEX MICROSCOPIC
Glucose, UA: NEGATIVE mg/dL
Hgb urine dipstick: NEGATIVE
Ketones, ur: NEGATIVE mg/dL
Leukocytes,Ua: NEGATIVE
Nitrite: NEGATIVE
Protein, ur: NEGATIVE mg/dL
Specific Gravity, Urine: 1.015 (ref 1.005–1.030)
pH: 7.5 (ref 5.0–8.0)

## 2024-07-23 LAB — MAGNESIUM: Magnesium: 1.2 mg/dL — ABNORMAL LOW (ref 1.7–2.4)

## 2024-07-23 MED ORDER — MAGNESIUM SULFATE 2 GM/50ML IV SOLN
2.0000 g | INTRAVENOUS | Status: AC
  Administered 2024-07-23: 2 g via INTRAVENOUS
  Filled 2024-07-23: qty 50

## 2024-07-23 MED ORDER — MAGNESIUM CITRATE PO SOLN
30.0000 mL | Freq: Two times a day (BID) | ORAL | 3 refills | Status: AC
  Filled 2024-07-23: qty 296, 5d supply, fill #0

## 2024-07-23 NOTE — ED Triage Notes (Signed)
 Pt sent from PCP due to low K+ and Mg+ results. C/o generalized weakness x 2 days. Decreased food intake. Reports good fluid intake.   Denies n/v/d/fever/known sick contacts.

## 2024-07-23 NOTE — Discharge Instructions (Signed)
 Contact a health care provider if: You get worse instead of better. Your symptoms return. Get help right away if: You develop severe muscle weakness. You have trouble breathing. You feel that your heart is racing. These symptoms may represent a serious problem that is an emergency. Do not wait to see if the symptoms will go away. Get medical help right away. Call your local emergency services (911 in the [place] Do not drive yourself to the hospital.

## 2024-07-23 NOTE — ED Provider Notes (Signed)
 [place] [facility] Provider Note   CSN: [id number] Arrival date & time: 07/23/24  1547     Patient presents with: Abnormal Lab and Weakness   Juan [name] is a 58 y.o. male who has a past medical history of alcoholic cirrhosis no longer drinking alcohol, hemochromatosis, chronic thrombocytopenia, and recurrent episodes of hypomagnesemia hyponatremia who presents emergency department for low magnesium  levels.  Patient had repeat lab work in the outpatient setting today was noted to continually have low magnesium  levels despite supplementation for his potassium and magnesium  and was sent to the emergency department for IV supplementation.  He reports some generalized weakness but no increased swelling or abdominal distention no abdominal pain.    Abnormal Lab Weakness      Prior to Admission medications   Medication Sig Start Date End Date Taking? Authorizing Provider  aMILoride  (MIDAMOR ) 5 MG tablet Take 2 tablets (10 mg total) by mouth 2 (two) times daily. 06/06/24   Craig Alan SAUNDERS, PA-C  ciclopirox  (LOPROX ) 0.77 % cream Apply topically 2 (two) times daily. 05/03/24   Alvia Bring, DO  furosemide  (LASIX ) 20 MG tablet Take 1 tablet (20 mg total) by mouth daily. 07/18/24   Alvia Bring, DO  magnesium  citrate ([employer] MAGNESIUM  CITRATE) SOLN Take 30 mLs by mouth 2 (two) times daily. 07/23/24   Crain, Whitney L, PA  pantoprazole  (PROTONIX ) 40 MG tablet Take 40 mg by mouth daily. 05/26/24   [provider]  potassium chloride  SA (KLOR-CON  M) 20 MEQ tablet Take 20 mEq by mouth 2 (two) times daily.    [provider]  senna-docusate (SENOKOT-S) 8.6-50 MG tablet Take 2 tablets by mouth 2 (two) times daily as needed for Constipation. 05/26/24     Vitamin D , Ergocalciferol , (DRISDOL ) 1.25 MG (50000 UNIT) CAPS capsule Take 1 capsule (50,000 Units total) by mouth every 7 (seven) days. 05/10/24   Alvia Bring, DO    Allergies: Cucumber  extract, Depakote er [divalproex sodium er], Depakote [valproic acid], Peanut (diagnostic), Peanut oil, Shellfish allergy, Codeine, Firvanq  [vancomycin ], Lactose intolerance (gi), Tylenol  [acetaminophen ], and Cantaloupe (diagnostic)    Review of Systems  Neurological:  Positive for weakness.    Updated Vital Signs BP 111/70   Pulse 66   Temp 97.8 F (36.6 C)   Resp 20   Ht 5' 3 (1.6 m)   Wt 86.6 kg   SpO2 94%   BMI 33.83 kg/m   Physical Exam Vitals and nursing note reviewed.  Constitutional:      General: He is not in acute distress.    Appearance: He is well-developed. He is not diaphoretic.  HENT:     Head: Normocephalic and atraumatic.  Eyes:     General: No scleral icterus.    Conjunctiva/sclera: Conjunctivae normal.  Cardiovascular:     Rate and Rhythm: Normal rate and regular rhythm.     Heart sounds: Normal heart sounds.  Pulmonary:     Effort: Pulmonary effort is normal. No respiratory distress.     Breath sounds: Normal breath sounds.  Abdominal:     Palpations: Abdomen is soft.     Tenderness: There is no abdominal tenderness.  Musculoskeletal:     Cervical back: Normal range of motion and neck supple.  Skin:    General: Skin is warm and dry.  Neurological:     Mental Status: He is alert.  Psychiatric:        Behavior: Behavior normal.     (all labs ordered  are listed, but only abnormal results are displayed) Labs Reviewed  COMPREHENSIVE METABOLIC PANEL WITH GFR - Abnormal; Notable for the following components:      Result Value   Sodium 134 (*)    CO2 20 (*)    Glucose, Bld 100 (*)    Calcium  8.2 (*)    Total Protein 6.1 (*)    Albumin 2.7 (*)    AST 104 (*)    Total Bilirubin 6.5 (*)    All other components within normal limits  CBC - Abnormal; Notable for the following components:   RBC 3.00 (*)    Hemoglobin 8.6 (*)    HCT 26.7 (*)    RDW 23.1 (*)    Platelets 49 (*)    All other components within normal limits  URINALYSIS, ROUTINE W  REFLEX MICROSCOPIC - Abnormal; Notable for the following components:   Bilirubin Urine SMALL (*)    All other components within normal limits  MAGNESIUM  - Abnormal; Notable for the following components:   Magnesium  1.2 (*)    All other components within normal limits    EKG: EKG Interpretation Date/Time:  Monday July 23 2024 15:58:16 EST Ventricular Rate:  77 PR Interval:  161 QRS Duration:  101 QT Interval:  417 QTC Calculation: 472 R Axis:   3  Text Interpretation: Sinus rhythm Anterior infarct, old Confirmed by Ula Barter [phone]) on 07/23/2024 4:06:01 PM  Radiology: No results found.   Procedures   Medications Ordered in the ED  magnesium  sulfate IVPB 2 g 50 mL (0 g Intravenous Stopped 07/23/24 1852)    Clinical Course as of 07/23/24 1857  Mon Jul 23, 2024  1711 Total Bilirubin(!): 6.5 [AH]    Clinical Course User Index [AH] Arloa Chroman, PA-C                                 Medical Decision Making Amount and/or Complexity of Data Reviewed Labs: ordered. Decision-making details documented in ED Course.  Risk Prescription drug management.   58 year old male who presents to the emergency department for hypomagnesemia.  I reviewed the patient's labs which showed normal potassium level, low magnesium  at 1.9, he has chronic multiple and varied deficiencies including chronic anemia, chronic thrombocytopenia I initially wanted to draw an INR however this was delayed and patient has already received his magnesium  supplementation has close follow-up and will get labs drawn in the outpatient setting.  He has no other complaints at this time.  Patient can continue with his magnesium  supplementation and close PCP follow-up.  Appears otherwise appropriate for discharge at this time     Final diagnoses:  Hypomagnesemia    ED Discharge Orders     None          Arloa Chroman, PA-C 07/23/24 TYRA Ula Barter JONELLE, MD 07/23/24 [id number]

## 2024-07-23 NOTE — ED Notes (Signed)
 Urinal at the bedside Patient is not able to urinate at this time Will ask again later

## 2024-07-25 ENCOUNTER — Other Ambulatory Visit: Payer: Self-pay | Admitting: Family Medicine

## 2024-07-25 NOTE — Progress Notes (Unsigned)
 07/26/2024 Juan [name] [id number] [birth year]  Referring provider: Alvia Bring, DO Primary GI doctor: Dr. Federico  ASSESSMENT AND PLAN:  Cirrhosis secondary to alcohol  history of portal hypertensive gastropathy, ascites, thrombocytopenia, myopathy and hepatic encephalopathy MELD 3.0: 21 at 04/06/2024 10:19 AM MELD-Na: 21 at 04/06/2024 10:19 AM Calculated from: Serum Creatinine: 0.87 mg/dL (Using min of 1 mg/dL) at 1/84/7974 89:80 AM Serum Sodium: 134 mEq/L at 04/06/2024 10:19 AM Total Bilirubin: 3.6 mg/dL at 1/84/7974 89:80 AM Serum Albumin: 2.7 g/dL at 1/84/7974 89:80 AM INR(ratio): 2 ratio at 04/06/2024 10:19 AM Age at listing (hypothetical): 58 years Sex: Male at 04/06/2024 10:19 AM  Had recent lab work yesterday for kidney function, we will see if we can get INR added on if not we will recheck INR c-Met CBC to calculate MELD in 2 to 4 weeks with also primary care.  Ascites controlled but with hypokalemia and hypomagnesia:      Patient switched to Midamor  10 mg BID secondary to gynecomastia last visit, continued on lasix  20 mg Denies swelling, consider stopping lasix  and consider increasing midamor  due to hypokalemia/hypomagnesium, will discuss with the physician Potassium has improved, he is on potassium 40 meq daily and has added magnesium  citrate for low magnesium  06/07/2024 no ascites -Nutrition and low sodium diet discussed with patient and information given  Varices screening / surveillance EGD:     Last EGD 02/2024 No history of varices. Has had some elevated BP/HR, consider adding on coreg but will wait until patient is more stable with ascites and post surgical Recall EGD 2 years unless patient has worsening anemia/melena.   Hepatic encephalopathy:  Pt does not report any symptoms consistent with HE and no asterixis on exam.  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  Negative PETH 05/25/2024 S/p cholecystectomy at [facility]  Bacteremia secondary to gram-positive with associated acute bacterial endocarditis Status post PICC line 7/16 Continue follow up with cardio/ID, last seen ID 05/22/24 Dr. Dennise 04/21/2024 Echo 70-75%, aortic valve gradient 16-28 mm, VTI 1.55, aortic valve mean gradient 28, V max 3.4 m/s, no vegetation Treatments have resolved continue follow-up  Moderate aortic stenosis 04/21/2024 Echo 70-75%, aortic valve gradient 16-28 mm, VTI 1.55, aortic valve mean gradient 28, V max 3.4 m/s, no vegetation Continue to follow-up with cardiology  Acute on chronic normocytic anemia 06/01/24 HGB 8.3 ( 9.7) (9.7) Recent Labs    04/19/24 2100 04/20/24 0820 04/21/24 0435 04/23/24 0356 04/24/24 0527 04/25/24 0435 06/01/24 0948 07/16/24 0916 07/18/24 0837 07/23/24 1555  HGB 9.4* 8.9* 9.8* 9.9* 9.7* 9.7* 8.3* 8.6* 8.3* 8.6*  Likely due to anemia of chronic disease as well as cirrhosis with portal gastropathy and GAVE, had 1 unit PRBC during hospitalization EGD no varices but did show GAVE and portal gastropathy no black stools, blood in stool.  Feb 2023 colonoscopy  All of iron at this time Recheck CBC with iron ferritin May need in hospital APC for GAVE if worsening anemia or not responsive to iron, ER precautions discussed  GERD Worsening, possibly due to magnesium ? No  melena Continue pantoprazole , add on pepcid , elevate head of bed  Patient Care Team: Alvia Bring, DO as PCP - General (Family Medicine)  HISTORY OF PRESENT ILLNESS: 58 y.o. male with medical history significant for several admissions for pneumonia, bacteremia, alcoholic cirrhosis, myopathy, hypertension, portal hypertension, alcohol abuse, C. difficile, PTSD, bipolar disorder, GERD presents for follow up of cirrhosis secondary to ETOH. Has history of the following complications from cirrhosis: portal hypertensive gastropathy, ascites, thrombocytopenia, and hepatic encephalopathy  I last saw the patient in office 06/06/2024 postcholecystectomy.  Wt Readings from Last 3 Encounters:  07/26/24 185 lb 2 oz (84 kg)  07/23/24 191 lb (86.6 kg)  07/16/24 191 lb (86.6 kg)   Discussed the use of AI scribe software for clinical note transcription with the patient, who gave verbal consent to proceed.  History of Present Illness   Juan [name] is a 58 year old male who presents for follow-up on electrolyte management.  He recently switched to magnesium  citrate due to low magnesium  levels, which were noted to be around one. He experiences side effects from the magnesium  citrate, including diarrhea and indigestion. His potassium levels have improved to 3.7, and his bilirubin and liver function are stable.  He was previously switched from spironolactone  to Midamor  (amiloride ) 10 mg twice daily due to increased breast tissue and sensitivity. He reports improvement in these symptoms and denies reaccumulation of fluid, bleeding, or leg swelling. He continues to wear compression socks to prevent varicose veins and swelling, although they sometimes bunch up and leave a crease.  He is on a potassium supplement, taking two tablets daily, one in the morning and one in the evening, and Lasix  (furosemide ) 10 mg twice daily. He experiences indigestion, which he associates with the magnesium  citrate, and mentions  vomiting after eating a ham sandwich, but denies any melena or hematemesis. He notes blood when straining and reports constipation when he does not take lactulose .  He experiences reflux and heartburn, particularly at night, and is supposed to be on pantoprazole , although he is unsure if he is taking it regularly. He uses Tums or Pepto at night to manage symptoms. His wife fills his pill box.  He is not currently on a PICC line or antibiotics and was supposed to start physical therapy, but his low magnesium  levels required hospitalization for IV treatment. He is due for an MRI in January.  Socially, he dabbles in painting, specifically using gouache, and has given away some of his paintings. He is interested in creating art for his daughter's playroom.      Social history:  He  reports that he has never smoked. His smokeless tobacco use includes snuff. He reports that he does not currently use alcohol. He reports that he does not use drugs.  RELEVANT GI HISTORY, LABS, IMAGING:  CBC    Component Value Date/Time   WBC 4.2 07/23/2024 1555   RBC 3.00 (L) 07/23/2024 1555   HGB 8.6 (L) 07/23/2024 1555   HGB 8.3 (L) 07/18/2024 0837   HCT 26.7 (L) 07/23/2024 1555   HCT 25.9 (L) 07/18/2024 0837   PLT 49 (L) 07/23/2024 1555   PLT 46 (LL) 07/18/2024 0837   MCV 89.0 07/23/2024 1555   MCV 84 07/18/2024 0837   MCH 28.7 07/23/2024 1555  MCHC 32.2 07/23/2024 1555   RDW 23.1 (H) 07/23/2024 1555   RDW 18.4 (H) 07/18/2024 0837   LYMPHSABS 1.8 07/18/2024 0837   MONOABS 0.8 04/19/2024 2100   EOSABS 0.3 07/18/2024 0837   BASOSABS 0.1 07/18/2024 0837   Recent Labs    04/19/24 2100 04/20/24 0820 04/21/24 0435 04/23/24 0356 04/24/24 0527 04/25/24 0435 06/01/24 0948 07/16/24 0916 07/18/24 0837 07/23/24 1555  HGB 9.4* 8.9* 9.8* 9.9* 9.7* 9.7* 8.3* 8.6* 8.3* 8.6*    CMP     Component Value Date/Time   NA 135 07/25/2024 1106   K 3.7 07/25/2024 1106   CL 104 07/25/2024 1106   CO2 18 (L)  07/25/2024 1106   GLUCOSE 78 07/25/2024 1106   GLUCOSE 100 (H) 07/23/2024 1555   BUN 8 07/25/2024 1106   CREATININE 1.17 07/25/2024 1106   CREATININE 0.63 (L) 10/01/2022 1148   CALCIUM  8.9 07/25/2024 1106   PROT 6.4 07/25/2024 1106   ALBUMIN 2.7 (L) 07/25/2024 1106   AST 90 (H) 07/25/2024 1106   ALT 34 07/25/2024 1106   ALKPHOS 117 07/25/2024 1106   BILITOT 5.0 (H) 07/25/2024 1106   GFRNONAA >60 07/23/2024 1555   GFRNONAA 101 01/06/2021 0000   GFRAA 117 01/06/2021 0000      Latest Ref Rng & Units 07/25/2024   11:06 AM 07/23/2024    3:55 PM 07/16/2024    9:16 AM  Hepatic Function  Total Protein 6.0 - 8.5 g/dL 6.4  6.1  6.4   Albumin 3.8 - 4.9 g/dL 2.7  2.7  2.8   AST 0 - 40 IU/L 90  104  160   ALT 0 - 44 IU/L 34  41  41   Alk Phosphatase 47 - 123 IU/L 117  107  104   Total Bilirubin 0.0 - 1.2 mg/dL 5.0  6.5  4.2       Latest Ref Rng & Units 04/06/2024   12:00 AM  Hepatitis C  AFP <6.1 ng/mL 10.5     Current Medications:     Current Outpatient Medications (Cardiovascular):    aMILoride  (MIDAMOR ) 5 MG tablet, Take 2 tablets (10 mg total) by mouth 2 (two) times daily.   furosemide  (LASIX ) 20 MG tablet, Take 1 tablet (20 mg total) by mouth daily.         Current Outpatient Medications (Other):    ciclopirox  (LOPROX ) 0.77 % cream, Apply topically 2 (two) times daily.   famotidine  (PEPCID ) 40 MG tablet, Take 1 tablet (40 mg total) by mouth at bedtime.   magnesium  citrate ([employer] MAGNESIUM  CITRATE) SOLN, Take 30 mLs by mouth 2 (two) times daily.   pantoprazole  (PROTONIX ) 40 MG tablet, Take 40 mg by mouth daily.   potassium chloride  SA (KLOR-CON  M) 20 MEQ tablet, Take 20 mEq by mouth 2 (two) times daily.   senna-docusate (SENOKOT-S) 8.6-50 MG tablet, Take 2 tablets by mouth 2 (two) times daily as needed for Constipation.   Vitamin D , Ergocalciferol , (DRISDOL ) 1.25 MG (50000 UNIT) CAPS capsule, Take 1 capsule (50,000 Units total) by mouth every 7 (seven) days.  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  Chronic GERD with HH  09/2019--EGD--[facility], [place]--showed no varices; gr D esophagitis;  Rx: PPI daily     Eustachian tube dysfunction 03/02/2023   Febrile illness 06/27/2023   Fever 06/20/2023   Fracture of laryngeal cartilage (HCC) 01/17/2020   Last Assessment & Plan:   Formatting of this note might be different from the original.  Emergency department follow-up for evaluation of laryngeal trauma.  CT obtained the night of the injury  is reviewed independently and shows nondisplaced fracture of the thyroid  cartilage.  In general, his voice is much improved from the night of the trauma.  Denies any difficulty breathing.  EXAM shows minimal   GERD (gastroesophageal reflux disease)    GI bleed 08/16/2022   Gram-positive bacteremia 02/26/2024   Head injury 10/31/2022   Hematemesis 09/13/2022   Hemochromatosis    Hiatal hernia 02/20/2022   History of alcohol abuse 05/20/2020   History of bilateral inguinal hernia repair 01/19/2019   Hx of blood clots    Leg   Hyperbilirubinemia 08/17/2022   Hyperreflexia    Hypertension    Hypocalcemia 11/02/2020   Hypokalemia 01/11/2022   Hypomagnesemia 01/11/2022   Hyponatremia 03/02/2022   Impingement syndrome, shoulder, right 05/20/2020   Intertrigo 03/20/2024   Laceration of extensor hallucis longus tendon, left, initial encounter 02/06/2017   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 07/11/2020   Lower extremity edema 09/06/2022   Lumbar degenerative disc disease 07/26/2022   Malnutrition of moderate degree 01/11/2022   MDD (major depressive disorder), recurrent severe, 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   Moderate aortic stenosis 11/17/2023   Nonrheumatic aortic valve stenosis    Normocytic anemia 03/02/2022   Obesity (BMI 30.0-34.9) 11/17/2023   Pain of left calf 04/11/2023   Pancytopenia (HCC) 09/18/2021   Pneumonia 04/20/2024   Portal hypertensive gastropathy (HCC)    Post-traumatic osteoarthritis of right knee 01/04/2023   Prolonged QT interval 05/05/2022   PTSD (post-traumatic stress disorder)    Rib pain on right side 10/31/2022   Right ankle sprain 02/12/2021   Right lower quadrant pain 04/17/2021   Right wrist injury 10/18/2023   Right wrist pain 01/23/2024   Sepsis (HCC) 02/15/2024   Shoulder pain, left, posterior 12/22/2021   Sleep apnea    Substance abuse (HCC)    Systolic murmur 01/06/2021   Thrombocytopenia 11/02/2020   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) 01/23/2024   Tibialis posterior tendinitis, right 04/14/2021  Tinea pedis 05/12/2021   Tobacco chew use 03/02/2022   Traumatic hemorrhagic shock (HCC)    Urinary frequency 03/23/2021   Well adult exam 01/06/2021   Allergies:  Allergies  Allergen Reactions   Cucumber Extract Itching and Nausea And Vomiting   Depakote Er [Divalproex Sodium Er] Swelling    Tongue swelling   Depakote [Valproic Acid] Anaphylaxis   Peanut (Diagnostic) Anaphylaxis and Swelling    Reaction to peanut butter, specifically   Peanut Oil Swelling   Shellfish Allergy Itching and Swelling    Shrimp, specifically   Codeine Itching and Rash    OK to take hydrocodone /oxycodone  with no issues   Firvanq  [Vancomycin ] Itching and Other (See Comments)    Redman syndrome after receiving via IV (unknown date, a few years ago) Patient complains of severe itching after being switched to IV formulation on 04/19/24 OK to take tablet formulation, no issues.     Lactose Intolerance (Gi) Diarrhea and Other (See Comments)    Bloating, flatulence Indigestion Stomach pain   Tylenol  [Acetaminophen ] Other (See Comments)    Hx of cirrhosis   Cantaloupe (Diagnostic) Rash     Surgical History:  He  has a past surgical history that includes Hernia repair; Vasectomy; Fracture surgery; Knee surgery (Right); Shoulder surgery (Bilateral); Carpal tunnel release (Bilateral); Esophagogastroduodenoscopy (egd) with propofol  (N/A, 09/24/2021); Colonoscopy with propofol  (N/A, 09/24/2021); biopsy (09/24/2021); Esophagogastroduodenoscopy (egd) with propofol  (N/A, 08/18/2022); Flexible sigmoidoscopy (N/A, 08/18/2022); Esophagogastroduodenoscopy (N/A, 03/06/2024); and TRANSESOPHAGEAL ECHOCARDIOGRAM (N/A, 03/07/2024). Family History:  His family history includes Breast cancer in his paternal aunt; Diabetes in his brother; Hypertension in his father; Other in his father; Pulmonary fibrosis in his  mother.  REVIEW OF SYSTEMS  : All other systems reviewed and negative except where noted in the History of Present Illness.  PHYSICAL EXAM: BP 120/62 (BP Location: Left Arm, Patient Position: Sitting, Cuff Size: Normal)   Pulse 84   Ht 5' 2 (1.575 m)   Wt 185 lb 2 oz (84 kg)   BMI 33.86 kg/m  Physical Exam   Physical Exam   GENERAL APPEARANCE: Well nourished, in no apparent distress. HEENT: No cervical lymphadenopathy, unremarkable thyroid , sclerae anicteric, conjunctiva pink. RESPIRATORY: Respiratory effort normal, breath sounds equal bilaterally without rales, rhonchi, or wheezing. CARDIO: Regular rate and rhythm with systolic murmur present, peripheral pulses intact. ABDOMEN: Soft, non-distended, active bowel sounds in all four quadrants, non-tender, no rebound, no mass appreciated. RECTAL: Declines examination. MUSCULOSKELETAL: Full range of motion, normal gait, without edema. SKIN: Dry, intact without rashes or lesions. No jaundice. NEURO: Alert, oriented, no focal deficits. PSYCH: Cooperative, normal mood and affect. EXTREMITIES: No asterixis, legs normal.          Alan JONELLE Coombs, PA-C 9:20 AM

## 2024-07-26 ENCOUNTER — Ambulatory Visit: Payer: Self-pay | Admitting: Family Medicine

## 2024-07-26 ENCOUNTER — Ambulatory Visit (INDEPENDENT_AMBULATORY_CARE_PROVIDER_SITE_OTHER): Admitting: Physician Assistant

## 2024-07-26 ENCOUNTER — Other Ambulatory Visit (HOSPITAL_BASED_OUTPATIENT_CLINIC_OR_DEPARTMENT_OTHER): Payer: Self-pay

## 2024-07-26 ENCOUNTER — Encounter: Payer: Self-pay | Admitting: Physician Assistant

## 2024-07-26 VITALS — BP 120/62 | HR 84 | Ht 62.0 in | Wt 185.1 lb

## 2024-07-26 DIAGNOSIS — K7031 Alcoholic cirrhosis of liver with ascites: Secondary | ICD-10-CM | POA: Diagnosis not present

## 2024-07-26 DIAGNOSIS — Z9049 Acquired absence of other specified parts of digestive tract: Secondary | ICD-10-CM | POA: Diagnosis not present

## 2024-07-26 DIAGNOSIS — K219 Gastro-esophageal reflux disease without esophagitis: Secondary | ICD-10-CM | POA: Diagnosis not present

## 2024-07-26 DIAGNOSIS — G621 Alcoholic polyneuropathy: Secondary | ICD-10-CM | POA: Diagnosis not present

## 2024-07-26 DIAGNOSIS — R772 Abnormality of alphafetoprotein: Secondary | ICD-10-CM | POA: Diagnosis not present

## 2024-07-26 DIAGNOSIS — K766 Portal hypertension: Secondary | ICD-10-CM

## 2024-07-26 DIAGNOSIS — I350 Nonrheumatic aortic (valve) stenosis: Secondary | ICD-10-CM | POA: Diagnosis not present

## 2024-07-26 DIAGNOSIS — K3189 Other diseases of stomach and duodenum: Secondary | ICD-10-CM | POA: Diagnosis not present

## 2024-07-26 DIAGNOSIS — D509 Iron deficiency anemia, unspecified: Secondary | ICD-10-CM

## 2024-07-26 DIAGNOSIS — D696 Thrombocytopenia, unspecified: Secondary | ICD-10-CM

## 2024-07-26 LAB — CMP14+EGFR
ALT: 34 IU/L (ref 0–44)
AST: 90 IU/L — ABNORMAL HIGH (ref 0–40)
Albumin: 2.7 g/dL — ABNORMAL LOW (ref 3.8–4.9)
Alkaline Phosphatase: 117 IU/L (ref 47–123)
BUN/Creatinine Ratio: 7 — ABNORMAL LOW (ref 9–20)
BUN: 8 mg/dL (ref 6–24)
Bilirubin Total: 5 mg/dL — ABNORMAL HIGH (ref 0.0–1.2)
CO2: 18 mmol/L — ABNORMAL LOW (ref 20–29)
Calcium: 8.9 mg/dL (ref 8.7–10.2)
Chloride: 104 mmol/L (ref 96–106)
Creatinine, Ser: 1.17 mg/dL (ref 0.76–1.27)
Globulin, Total: 3.7 g/dL (ref 1.5–4.5)
Glucose: 78 mg/dL (ref 70–99)
Potassium: 3.7 mmol/L (ref 3.5–5.2)
Sodium: 135 mmol/L (ref 134–144)
Total Protein: 6.4 g/dL (ref 6.0–8.5)
eGFR: 73 mL/min/1.73 (ref >59)

## 2024-07-26 LAB — MAGNESIUM: Magnesium: 1.5 mg/dL — ABNORMAL LOW (ref 1.6–2.3)

## 2024-07-26 MED ORDER — FAMOTIDINE 40 MG PO TABS
40.0000 mg | ORAL_TABLET | Freq: Every day | ORAL | 2 refills | Status: AC
  Filled 2024-07-26: qty 90, 90d supply, fill #0

## 2024-07-26 NOTE — Patient Instructions (Addendum)
 _______________________________________________________  If your blood pressure at your visit was 140/90 or greater, please contact your primary care physician to follow up on this.  _______________________________________________________  If you are age 56 or older, your body mass index should be between 23-30. Your Body mass index is 33.86 kg/m. If this is out of the aforementioned range listed, please consider follow up with your Primary Care Provider.  If you are age 39 or younger, your body mass index should be between 19-25. Your Body mass index is 33.86 kg/m. If this is out of the aformentioned range listed, please consider follow up with your Primary Care Provider.   ________________________________________________________  The [facility] providers would like to encourage you to use MYCHART to communicate with providers for non-urgent requests or questions.  Due to long hold times on the telephone, sending your provider a message by [facility] may be a faster and more efficient way to get a response.  Please allow 48 business hours for a response.  Please remember that this is for non-urgent requests.  _______________________________________________________  [facility] is using a team-based approach to care.  Your team is made up of your doctor and two to three APPS. Our APPS (Nurse Practitioners and Physician Assistants) work with your physician to ensure care continuity for you. They are fully qualified to address your health concerns and develop a treatment plan. They communicate directly with your gastroenterologist to care for you. Seeing the Advanced Practice Practitioners on your physician's team can help you by facilitating care more promptly, often allowing for earlier appointments, access to diagnostic testing, procedures, and other specialty referrals.   Please call [phone] to call and schedule your MRI in January 2026 depending on your schedule.  Please take  your proton pump inhibitor medication, continue pantoprazole   Please take this medication 30 minutes to 1 hour before meals- this makes it more effective.  Add on pepcid  at night Avoid spicy and acidic foods Avoid fatty foods Limit your intake of coffee, tea, alcohol, and carbonated drinks Work to maintain a healthy weight Keep the head of the bed elevated at least 3 inches with blocks or a wedge pillow if you are having any nighttime symptoms Stay upright for 2 hours after eating Avoid meals and snacks three to four hours before bedtime  Diet Instructions for Patients with Cirrhosis Eating well is very important for people with cirrhosis. Good nutrition can help you feel better, keep your muscles strong, and lower your risk of complications.[1-3] Eat enough calories and protein: Aim for about 35 calories per kilogram of your body weight each day, and 1.2-1.5 grams of protein per kilogram. Protein helps keep your muscles strong. Do not avoid protein, even if you have had confusion or hepatic encephalopathy in the past.[1-2][4-6] Choose a variety of protein sources: Include lean meats, fish, eggs, dairy, beans, nuts, and lentils. Vegetable and dairy proteins are especially helpful.[1][7]Branched-chain amino acid-rich foods (e.g., dairy, legumes, fish) may be particularly beneficial. Eat small, frequent meals: Try to eat every 3-4 hours while awake. Have a snack before bed, such as yogurt, cheese, or a sandwich, to help prevent muscle loss overnight.[1-3][5] Practical options include yogurt, protein bars, rice balls, nuts, beans, or fish, and should be tailored to patient preferences and local food availability.[1][6-8]  Limit salt (sodium) if you have swelling or fluid in your belly (ascites): Use less than 2,000 mg of sodium per day. Avoid salty foods like canned soups, processed meats, chips, and restaurant meals. Read food labels for  sodium content.[2] Drink fluids as usual, unless your doctor  tells you to restrict fluids. Only restrict fluids if your sodium level in the blood is very low.[2] Take vitamin and mineral supplements if recommended: People with cirrhosis often need extra vitamins and minerals, especially zinc , vitamin D , and thiamine . Your doctor may check your levels and suggest supplements.[6][8-9] Avoid alcohol completely: Alcohol can make liver damage worse.[8] Stay active: Gentle exercise, like walking or light resistance training, can help keep your muscles strong.[4][7-8] See a dietitian if possible: A nutrition expert can help you make a meal plan that fits your needs.[1] If you have trouble eating enough or are losing weight, tell your healthcare team. They may suggest nutrition drinks or other ways to help you get the calories and protein you need.[1-3] Remember, eating well is a key part of managing cirrhosis and staying healthy.

## 2024-07-30 ENCOUNTER — Other Ambulatory Visit: Payer: Self-pay | Admitting: Family Medicine

## 2024-07-30 DIAGNOSIS — K7031 Alcoholic cirrhosis of liver with ascites: Secondary | ICD-10-CM

## 2024-07-30 DIAGNOSIS — E876 Hypokalemia: Secondary | ICD-10-CM

## 2024-08-01 NOTE — Telephone Encounter (Signed)
 Patient has been informed that lab orders are in place and he can return at his convenience to have these drawn.

## 2024-08-02 ENCOUNTER — Other Ambulatory Visit (HOSPITAL_BASED_OUTPATIENT_CLINIC_OR_DEPARTMENT_OTHER): Payer: Self-pay

## 2024-08-02 ENCOUNTER — Other Ambulatory Visit: Payer: Self-pay | Admitting: Family Medicine

## 2024-08-02 ENCOUNTER — Encounter: Payer: Self-pay | Admitting: Family Medicine

## 2024-08-02 ENCOUNTER — Ambulatory Visit (INDEPENDENT_AMBULATORY_CARE_PROVIDER_SITE_OTHER): Admitting: Family Medicine

## 2024-08-02 VITALS — BP 132/84 | HR 96 | Ht 62.0 in | Wt 170.0 lb

## 2024-08-02 DIAGNOSIS — M25511 Pain in right shoulder: Secondary | ICD-10-CM

## 2024-08-02 DIAGNOSIS — E876 Hypokalemia: Secondary | ICD-10-CM | POA: Diagnosis not present

## 2024-08-02 DIAGNOSIS — F332 Major depressive disorder, recurrent severe without psychotic features: Secondary | ICD-10-CM

## 2024-08-02 DIAGNOSIS — F5105 Insomnia due to other mental disorder: Secondary | ICD-10-CM

## 2024-08-02 MED ORDER — DOXEPIN HCL 3 MG PO TABS
3.0000 mg | ORAL_TABLET | Freq: Every evening | ORAL | 1 refills | Status: AC | PRN
  Filled 2024-08-02: qty 30, 30d supply, fill #0

## 2024-08-02 MED ORDER — PREDNISONE 10 MG (21) PO TBPK
ORAL_TABLET | ORAL | 0 refills | Status: AC
  Filled 2024-08-02: qty 21, 6d supply, fill #0

## 2024-08-02 NOTE — Patient Instructions (Signed)
 Let's try doxepin for sleep.

## 2024-08-02 NOTE — Progress Notes (Unsigned)
 Cylis Ayars - 58 y.o. male MRN [id number]  Date of birth: June 05, 1966  Subjective Chief Complaint  Patient presents with   Mood   Bleeding/Bruising    HPI Lane Eland is a 58 year old male here today for follow-up visit.  Reports having increased mood dysphoria and insomnia.  He is followed by psychiatry at the [facility] and has been undergoing EMDR which has caused some memories from his [employer] career to resurface.  He also has a close friend whose father died recently.  Also remains concerned about his own health.  Previously on Seroquel  for insomnia but has had acquired long QT likely related to hypomagnesemia and hypokalemia.  He is taking supplements as directed at this time although has had some previous issues with compliance.  He was also having some right shoulder pain.  Some radiation into the upper arm.  Denies numbness or tingling.  Does not recall any injury.    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  Chronic GERD with HH  09/2019--EGD--[facility], [place]--showed no varices; gr D esophagitis;  Rx: PPI daily     Eustachian tube dysfunction 03/02/2023   Febrile illness 06/27/2023   Fever 06/20/2023   Fracture of laryngeal cartilage (HCC) 01/17/2020   Last Assessment & Plan:   Formatting of this note might be different from the original.  Emergency department follow-up for evaluation of laryngeal trauma.  CT obtained the night of the injury is reviewed independently and shows nondisplaced fracture of the thyroid  cartilage.  In general, his voice is much improved from the night of the trauma.  Denies any difficulty breathing.  EXAM shows minimal   GERD (gastroesophageal reflux disease)    GI bleed 08/16/2022   Gram-positive bacteremia 02/26/2024   Head injury 10/31/2022   Hematemesis 09/13/2022   Hemochromatosis    Hiatal hernia 02/20/2022   History of alcohol abuse 05/20/2020   History of bilateral inguinal hernia repair 01/19/2019   Hx of blood clots    Leg   Hyperbilirubinemia 08/17/2022   Hyperreflexia    Hypertension  Hypocalcemia 11/02/2020   Hypokalemia 01/11/2022   Hypomagnesemia 01/11/2022   Hyponatremia 03/02/2022   Impingement syndrome, shoulder, right 05/20/2020   Intertrigo 03/20/2024   Laceration of extensor hallucis longus tendon, left, initial encounter 02/06/2017   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 07/11/2020   Lower extremity edema 09/06/2022   Lumbar degenerative disc disease 07/26/2022   Malnutrition  of moderate degree 01/11/2022   MDD (major depressive disorder), recurrent severe, 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   Moderate aortic stenosis 11/17/2023   Nonrheumatic aortic valve stenosis    Normocytic anemia 03/02/2022   Obesity (BMI 30.0-34.9) 11/17/2023   Pain of left calf 04/11/2023   Pancytopenia (HCC) 09/18/2021   Pneumonia 04/20/2024   Portal hypertensive gastropathy (HCC)    Post-traumatic osteoarthritis of right knee 01/04/2023   Prolonged QT interval 05/05/2022   PTSD (post-traumatic stress disorder)    Rib pain on right side 10/31/2022   Right ankle sprain 02/12/2021   Right lower quadrant pain 04/17/2021   Right wrist injury 10/18/2023   Right wrist pain 01/23/2024   Sepsis (HCC) 02/15/2024   Shoulder pain, left, posterior 12/22/2021   Sleep apnea    Substance abuse (HCC)    Systolic murmur 01/06/2021   Thrombocytopenia 11/02/2020   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) 01/23/2024   Tibialis posterior tendinitis, right 04/14/2021   Tinea pedis 05/12/2021   Tobacco chew use 03/02/2022   Traumatic hemorrhagic shock (HCC)    Urinary frequency 03/23/2021   Well adult exam 01/06/2021    Past Surgical History:  Procedure Laterality Date   BIOPSY  09/24/2021   Procedure: BIOPSY;  Surgeon: Federico Rosario BROCKS, MD;  Location: [facility] ENDOSCOPY;  Service: Gastroenterology;;   ORIN MEDIATE RELEASE Bilateral    COLONOSCOPY WITH PROPOFOL  N/A 09/24/2021   Procedure: COLONOSCOPY WITH PROPOFOL ;  Surgeon: Federico Rosario BROCKS, MD;  Location: [facility] ENDOSCOPY;  Service: Gastroenterology;  Laterality: N/A;   ESOPHAGOGASTRODUODENOSCOPY N/A 03/06/2024   Procedure: EGD (ESOPHAGOGASTRODUODENOSCOPY);  Surgeon: Suzann Inocente HERO, MD;  Location: [facility] ENDOSCOPY;  Service: Gastroenterology;  Laterality: N/A;   ESOPHAGOGASTRODUODENOSCOPY (EGD) WITH PROPOFOL  N/A 09/24/2021   Procedure: ESOPHAGOGASTRODUODENOSCOPY (EGD) WITH PROPOFOL ;  Surgeon: Federico Rosario BROCKS, MD;  Location: [facility] ENDOSCOPY;   Service: Gastroenterology;  Laterality: N/A;   ESOPHAGOGASTRODUODENOSCOPY (EGD) WITH PROPOFOL  N/A 08/18/2022   Procedure: ESOPHAGOGASTRODUODENOSCOPY (EGD) WITH PROPOFOL ;  Surgeon: Abran Norleen SAILOR, MD;  Location: [facility] ENDOSCOPY;  Service: Gastroenterology;  Laterality: N/A;   FLEXIBLE SIGMOIDOSCOPY N/A 08/18/2022   Procedure: FLEXIBLE SIGMOIDOSCOPY;  Surgeon: Abran Norleen SAILOR, MD;  Location: [facility] ENDOSCOPY;  Service: Gastroenterology;  Laterality: N/A;   FRACTURE SURGERY     left ankle plate   HERNIA REPAIR     inguinal   KNEE SURGERY Right    x 4   SHOULDER SURGERY Bilateral    x 2   TRANSESOPHAGEAL ECHOCARDIOGRAM (CATH LAB) N/A 03/07/2024   Procedure: TRANSESOPHAGEAL ECHOCARDIOGRAM;  Surgeon: Shlomo Wilbert SAUNDERS, MD;  Location: [facility] INVASIVE CV LAB;  Service: Cardiovascular;  Laterality: N/A;   VASECTOMY      Social History   Socioeconomic History   Marital status: Married    Spouse name: [name]   Number of children: 2   Years of education: Not on file   Highest education level: 12th grade  Occupational History    Comment: disability  Tobacco Use   Smoking status: Never   Smokeless tobacco: Current    Types: Snuff  Vaping Use   Vaping status: Never Used  Substance and Sexual  Activity   Alcohol use: Not Currently   Drug use: Never   Sexual activity: Yes    Partners: Female  Other Topics Concern   Not on file  Social History Narrative   Lives with wife   Social Drivers of Health   Tobacco Use: High Risk (08/04/2024)   Received from [facility]   Patient History    Smoking Tobacco Use: Never    Smokeless Tobacco Use: Current    Passive Exposure: Not on file  Financial Resource Strain: Medium Risk (06/05/2024)   Overall Financial Resource Strain (CARDIA)    Difficulty of Paying Living Expenses: Somewhat hard  Food Insecurity: No Food Insecurity (08/05/2024)   Received from [facility]   Epic    Within the past 12 months, you worried that your food would run out before you  got the money to buy more.: Never true    Within the past 12 months, the food you bought just didn't last and you didn't have money to get more.: Never true  Transportation Needs: No Transportation Needs (08/05/2024)   Received from [facility]    In the past 12 months, has lack of transportation kept you from medical appointments or from getting medications?: No    In the past 12 months, has lack of transportation kept you from meetings, work, or from getting things needed for daily living?: No  Physical Activity: Insufficiently Active (06/05/2024)   Exercise Vital Sign    Days of Exercise per Week: 1 day    Minutes of Exercise per Session: 10 min  Stress: Stress Concern Present (08/05/2024)   Received from [facility] of Occupational Health - Occupational Stress Questionnaire    Do you feel stress - tense, restless, nervous, or anxious, or unable to sleep at night because your mind is troubled all the time - these days?: Rather much  Social Connections: Unknown (06/05/2024)   Social Connection and Isolation Panel    Frequency of Communication with Friends and Family: Three times a week    Frequency of Social Gatherings with Friends and Family: Once a week    Attends Religious Services: Not on file    Active Member of Clubs or Organizations: Yes    Attends Banker Meetings: Not on file    Marital Status: Married  Depression (PHQ2-9): High Risk (08/02/2024)   Depression (PHQ2-9)    PHQ-2 Score: 11  Alcohol Screen: Low Risk (06/05/2024)   Alcohol Screen    Last Alcohol Screening Score (AUDIT): 0  Housing: Low Risk (08/05/2024)   Received from [facility]    In the last 12 months, was there a time when you were not able to pay the mortgage or rent on time?: No    In the past 12 months, how many times have you moved where you were living?: 1    At any time in the past 12 months, were you homeless or living in a shelter (including  now)?: No  Utilities: Not At Risk (08/05/2024)   Received from [facility]    In the past 12 months has the electric, gas, oil, or water company threatened to shut off services in your home?: No  Health Literacy: Adequate Health Literacy (08/02/2024)   B1300 Health Literacy    Frequency of need for help with medical instructions: Rarely    Family History  Problem Relation Age of Onset   Pulmonary fibrosis Mother  Hypertension Father    Other Father        liver failure   Diabetes Brother    Breast cancer Paternal Aunt    Colon cancer Neg Hx    Esophageal cancer Neg Hx    Stomach cancer Neg Hx    Rectal cancer Neg Hx     Health Maintenance  Topic Date Due   Hepatitis B Vaccines 19-59 Average Risk (2 of 3 - 19+ 3-dose series) 07/16/2025 (Originally 12/02/2022)   COVID-19 Vaccine (4 - 2025-26 season) 08/01/2025 (Originally 04/23/2024)   Medicare Annual Wellness (AWV)  04/03/2025   DTaP/Tdap/Td (3 - Td or Tdap) 06/20/2029   Colonoscopy  09/25/2031   Pneumococcal Vaccine: 50+ Years  Completed   Influenza Vaccine  Completed   Hepatitis C Screening  Completed   HIV Screening  Completed   Zoster Vaccines- Shingrix  Completed   HPV VACCINES  Aged Out   Meningococcal B Vaccine  Aged Out     ----------------------------------------------------------------------------------------------------------------------------------------------------------------------------------------------------------------- Physical Exam BP 132/84 (BP Location: Left Arm, Patient Position: Sitting, Cuff Size: Normal)   Pulse 96   Ht 5' 2 (1.575 m)   Wt 170 lb (77.1 kg)   SpO2 97%   BMI 31.09 kg/m   Physical Exam Constitutional:      Appearance: Normal appearance.  HENT:     Head: Normocephalic and atraumatic.  Eyes:     General: No scleral icterus. Cardiovascular:     Rate and Rhythm: Normal rate and regular rhythm.  Pulmonary:     Effort: Pulmonary effort is normal.     Breath  sounds: Normal breath sounds.  Musculoskeletal:     Cervical back: Neck supple.  Neurological:     Mental Status: He is alert.  Psychiatric:        Mood and Affect: Mood normal.        Behavior: Behavior normal.     ------------------------------------------------------------------------------------------------------------------------------------------------------------------------------------------------------------------- Assessment and Plan  MDD (major depressive disorder), recurrent severe, without psychosis (HCC) He is currently being followed by behavioral health at the [facility]. currently doing EMDR which has caused some worsening of his symptoms.  Increase insomnia.  Will try low strength doxepin  as several other medications are contraindicated due to his liver disease and/or QT prolongation.  Discussed availability of [facility] as well as behavioral urgent care in [place]  Hypomagnesemia Continue magnesium  supplementation.  Update levels.  Hypokalemia Repeat potassium levels.  Continue potassium sparing diuretic as well as potassium supplementation.  Right shoulder pain Adding prednisone  taper and given handout for home exercises.   Meds ordered this encounter  Medications   predniSONE  (STERAPRED UNI-PAK 21 TAB) 10 MG (21) TBPK tablet    Sig: Taper as directed on packaging.    Dispense:  21 tablet    Refill:  0   Doxepin  HCl 3 MG TABS    Sig: Take 1 tablet (3 mg total) by mouth at bedtime as needed for sleep.    Dispense:  30 tablet    Refill:  1    No follow-ups on file.         [1]  Allergies Allergen Reactions   Cucumber Extract Itching and Nausea And Vomiting   Depakote Er [Divalproex Sodium Er] Swelling    Tongue swelling   Depakote [Valproic Acid] Anaphylaxis   Peanut (Diagnostic) Anaphylaxis and Swelling    Reaction to peanut butter, specifically   Peanut Oil Swelling   Shellfish Allergy Itching and Swelling    Shrimp, specifically  Codeine Itching and Rash    OK to take hydrocodone /oxycodone  with no issues   Firvanq  [Vancomycin ] Itching and Other (See Comments)    Redman syndrome after receiving via IV (unknown date, a few years ago) Patient complains of severe itching after being switched to IV formulation on 04/19/24 OK to take tablet formulation, no issues.     Lactose Intolerance (Gi) Diarrhea and Other (See Comments)    Bloating, flatulence Indigestion Stomach pain   Tylenol  [Acetaminophen ] Other (See Comments)    Hx of cirrhosis   Cantaloupe (Diagnostic) Rash

## 2024-08-03 NOTE — Telephone Encounter (Signed)
 Prior Authorization initiated with [employer] for Doxepin. All contraindicated medications noted on PA application.   Waiting to fax completed OV notes to [phone]) Reference PA#: [id number]

## 2024-08-04 LAB — BASIC METABOLIC PANEL WITH GFR
BUN/Creatinine Ratio: 8 — ABNORMAL LOW (ref 9–20)
BUN: 8 mg/dL (ref 6–24)
CO2: 23 mmol/L (ref 20–29)
Calcium: 7.5 mg/dL — ABNORMAL LOW (ref 8.7–10.2)
Chloride: 107 mmol/L — ABNORMAL HIGH (ref 96–106)
Creatinine, Ser: 0.98 mg/dL (ref 0.76–1.27)
Glucose: 114 mg/dL — ABNORMAL HIGH (ref 70–99)
Potassium: 3.4 mmol/L — ABNORMAL LOW (ref 3.5–5.2)
Sodium: 146 mmol/L — ABNORMAL HIGH (ref 134–144)
eGFR: 91 mL/min/1.73 (ref >59)

## 2024-08-04 LAB — AMMONIA: Ammonia: 164 ug/dL (ref 40–200)

## 2024-08-04 LAB — MAGNESIUM: Magnesium: 1.4 mg/dL — ABNORMAL LOW (ref 1.6–2.3)

## 2024-08-04 LAB — PROTIME-INR
INR: 1.7 — ABNORMAL HIGH (ref 0.9–1.2)
Prothrombin Time: 18.2 s — ABNORMAL HIGH (ref 9.1–12.0)

## 2024-08-05 DIAGNOSIS — M25511 Pain in right shoulder: Secondary | ICD-10-CM | POA: Insufficient documentation

## 2024-08-05 NOTE — Assessment & Plan Note (Addendum)
 Adding prednisone  taper and given handout for home exercises.

## 2024-08-05 NOTE — Assessment & Plan Note (Signed)
 Continue magnesium  supplementation.  Update levels.

## 2024-08-05 NOTE — Assessment & Plan Note (Addendum)
 He is currently being followed by behavioral health at the [facility]. currently doing EMDR which has caused some worsening of his symptoms.  Increase insomnia.  Will try low strength doxepin  as several other medications are contraindicated due to his liver disease and/or QT prolongation.  Discussed availability of [facility] as well as behavioral urgent care in [place]

## 2024-08-05 NOTE — Assessment & Plan Note (Addendum)
 Repeat potassium levels.  Continue potassium sparing diuretic as well as potassium supplementation.

## 2024-08-06 NOTE — Consults (Signed)
 ------------------------------------------------------------------------------- Attestation signed by Alm Dames, MD at 08/06/24 1634 Dames:  I evaluated this patient with Juan [name], assisted in the assessment and formulation of the plan, and concur with the above.     Briefly, the patient is a 58 year old [name] with a past medical history of alcoholic cirrhosis, reflux, cholecystectomy, and bipolar disorder who presented with chest pain, nausea, and shortness of breath.  The patient states that he was laying in bed when he developed acute chest pain.  This did radiate to his left arm.  He denies significant abdominal pain or change in bowel habits.  He states that he typically has formed stools at home and has not noticed any blood in his stool.  He was given MiraLAX  in the hospital, and states that he has had loose stool since getting MiraLAX .  The patient states that he was previously sober for 4 years, but started drinking alcohol again 6 months ago.  He has been drinking 6-8 beers per day with last drink approximately 3 weeks ago.  The patient underwent blood work during his stay with finding of elevated bilirubin around 5.0, AST 123, ALT 38, magnesium  1.5.  He underwent a CT scan that showed acute pancreatitis involving the pancreatic head and neck and a 3 mm calcification noted in the region of the sphincter.  He was also noted to have colitis in the cecum, ascending colon, descending colon, and sigmoid colon.  Patient has undergone abdominal ultrasound that showed a cirrhotic liver with a 2 cm hypoechoic lesion in the right lobe as well as borderline, bile duct dilatation and cholecystectomy.  No stones were seen. The patient has been seen at [facility] by Dr. Craig.  He last underwent EGD in July 2025 without finding of varices with a 2-year recall recommended.  He underwent MRI/MRCP in July 2025 with no lesion seen.  Exam: Gen: in bed, NAD HEENT: EOMI, icteric CV: normal rate, regular  rhythm Pulm: clear anteriorly Abd: soft, nt, nd, +bs Ext:no LE edema  Assessment/plan:  The patient is a 58 year old [name] with a past medical history of alcoholic cirrhosis, reflux, cholecystectomy, and bipolar disorder who presented with chest pain, nausea, and shortness of breath.  The patient states that his chest pain is now resolved.  I reviewed his imaging with him with findings concerning for a possible stone in his bile duct or pancreatic duct, as well as a possible liver lesion.  His GFR is normal.  We are can order MRI/MRCP for further evaluation.  Agree with continuing IV fluids for pancreatitis and continuing to replete his electrolytes.  Continue to trend his CBC, CMP, and INR.  We had a lengthy discussion regarding alcohol cessation, and the patient states that he is going to try and quit alcohol again.  Patient states that he will follow-up with his outpatient gastroenterologist upon discharge for chronic care.              Alm LELON Dames, MD 08/06/2024/4:29 PM  -------------------------------------------------------------------------------  [facility]  Consultation  Ordering Physician: Dr. Derrick Consulting Physician: Juan [name] FORBES Stains, PA  Reason For Consult: ETOH cirrhosis, pancreatitis, hyperbilirubinemia  Assessment  Active Hospital Problems   Hypomagnesemia    Drug-induced hypokalemia    Prolonged Q-T interval on ECG    Elevated d-dimer    Normocytic anemia, not due to blood loss    Thrombocytopenia    History of cirrhosis of liver    Costochondritis, acute    Acute chest pain  58 y.o. [name] who presents with PMH ETOH cirrhosis with PHG, ascites, thrombocytopenia, myopathy, hepatic encephalopathy, GERD, s/p cholecystectomy 05/2024, HTN, ETOH abuse, C diff,  PTSD, bipolar disorder who presented to [facility] due to acute chest pain, nausea, SOB, worse with activity and came to ER due to worsening symptoms. On ER evaluation,  troponins flat, and EKG stable so cardiac cause ruled out. He was found to have very low Mag at 1.0, which was repleted and mild hypokalemia with K+ 3.1. Hgb 8.5 with baseline 9-10, Plt's 54,000, stable. Biliubin 5.0 and went up to 6.0 this am. Baseline bili around 5.0. AST 123, ALT 38, Alk phos 100. Ca 6.9 this am, Mag 1.5 today.  Patient developed severe nausea and vomiting on 08/05/24 and CT Abd/pelvis showed acute pancreatitis as well as colitis of cecum, ascending colon, descending colon and sigmoid. Lipase 120. Ammonia this am elevated at 103. He was given IV antibiotics, made NPO and given IVF and pain medication. Patient follows with [facility] and I reviewed recent clinic note from 07/26/2024. GI asked to consult due to pancreatitis, colitis with ETOH cirrhosis.    Recommendations  -Continue with IVF, IV antibiotics.  -Ok to advance diet from clear to full liquids today if patient tolerating ok.  -Recommend repletion of electrolytes, Mg, Ca, K+ -Will need antibiotics at discharge for colitis.  -Would continue with mag citrate as this is what he was taking outpatient for magnesium  supplementation and to help prevent hepatic encephalopathy with regular BM's.  - If patient continues to improve, can advance to soft GI diet in am and consider discharge home on 08/07/24 if medically stable.  - Will hold on getting MRCP for now as bilirubin is chronically elevated around 5 and his primary GI at [facility] is planning for repeat MRCP in 08/2024. If bilirubin remains elevated or elevates further, will order MRCP to further evaluate.  - Monitor cmp daily.  - Monitor cbc daily and transfuse for Hgb less than 7.0.  - Recheck ammonia level in am.    History  History of Present Illness:  Juan [name] is a 58 y.o. [name] who presents with PMH ETOH cirrhosis with PHG, ascites, thrombocytopenia, myopathy, hepatic encephalopathy, GERD, s/p cholecystectomy 05/2024, HTN, ETOH abuse, C diff,  PTSD, bipolar disorder  who presented to [facility] due to acute chest pain, nausea, SOB, worse with activity and came to ER due to worsening symptoms. On ER evaluation, troponins flat, and EKG stable so cardiac cause ruled out. He was found to have very low Mag at 1.0, which was repleted and mild hypokalemia with K+ 3.1. Hgb 8.5 with baseline 9-10, Plt's 54,000, stable. Biliubin 5.0 and went up to 6.0 this am. Baseline bili around 5.0. AST 123, ALT 38, Alk phos 100. Ca 6.9 this am, Mag 1.5 today.  Patient developed severe nausea and vomiting on 08/05/24 and CT Abd/pelvis showed acute pancreatitis as well as colitis of cecum, ascending colon, descending colon and sigmoid. Lipase 120. Ammonia this am elevated at 103. He was given IV antibiotics, made NPO and given IVF and pain medication. Patient follows with [facility], Dr. Craig, and I reviewed recent clinic note from 07/26/2024. GI asked to consult due to pancreatitis, colitis with ETOH cirrhosis.  Patient states he is feeling better today. He continues with lower abdominal pain/cramping and did have several BM's this am. He states no further nausea/vomiting and upper abdominal pain improving. Tolerating clear liquid diet this am.   RUQ US  08/05/24 - Cirrhotic liver.  There is an indeterminate 2 cm hypoechoic lesion in the right lobe that is not definitely seen on prior imaging. This does not appear to represent a cyst and further evaluation is warranted with a dedicated hepatic protocol CT or MRI when able. Cholecystectomy. Borderline CBD dilatation, no stones identified. CT Abd/pelvis 08/05/24 - Findings suspicious for acute pancreatitis involving the pancreatic head and neck. There is a 3 mm calcification noted in the region of the sphincter which could reflect obstructive cholelithiasis. Colitis involving the cecum and ascending colon as well as the descending and sigmoid colon. Cirrhotic liver. Small hiatal hernia. EGD 02/2024 - no varices, repeat in 2 years per clinic note.  MRCP  03/03/2024 - no liver lesions Past Medical History:  Diagnosis Date   Acid reflux    Dental crowns present    DVT (deep venous thrombosis) (*) 2014   AFTER ANKLE SURGERY   Encounter for screening colonoscopy 11/02/2016   [facility]. Dr Debarah. Recall colon 10/2019.   History of Clostridioides difficile colitis    Liver cirrhosis (*)    PONV (postoperative nausea and vomiting)    Ruptured eardrum    Past Surgical History:  Procedure Laterality Date   Ankle fracture surgery Left 11-28-2012   ORIF left malleolus fracture by [facility] at [facility]   Ankle surgery     Carpal tunnel release     bilateral hands   Colonoscopy  02/24/2018   Colonoscopy w/ biopsies N/A 12/08/2017   Procedure: COLONOSCOPY W/ BIOPSY;  Surgeon: Lamar JINNY Satchel, MD;  Location: [facility] ENDO;  Service: Gastroenterology;  Laterality: N/A;   Hernia repair     Knee surgery     Shoulder adhesion release     Shoulder arthroscopy Left 04-30-2010   debridement and decompression of labral cyst, SLAP repair.  Performed by Dr. Hendrick at the [facility] surgery center./jbcma   Shoulder arthroscopy w/ superior labral anterior posterior repair Right 05/05/2016   RIGHT shoulder arthroscopy with limited debridement, decompression/cranioplasty, this clavicle resection, and SLAP repair   Upper gastrointestinal endoscopy     Vasectomy      Allergies[1] Infusions:    dextrose  5% lactated ringers  100 mL/hr (08/06/24 0218)   Medications:   diclofenac  sodium  4 g Topical QID   folic acid   1 mg Oral Daily   magnesium  sulfate  2 g IntraVENous Q1H   ondansetron   4 mg IntraVENous ONCE   pantoprazole   40 mg IntraVENous BID   piperacillin -tazabactam  4.5 g IntraVENous Q8H SCH   potassium bicarbonate-citric acid  40 mEq Oral Q1H   scopolamine  1 patch Transdermal Q72H   thiamine   100 mg IntraVENous Daily   PRN medications: dextrose  Oral **OR** dextrose  IntraVENous **OR** dextrose  IntraVENous **OR** glucagon  Intramuscular,HYDROmorphone IntraVENous,melatonin Oral,naloxone IntraVENous,ondansetron  IntraVENous,polyethylene glycol Oral  Social History[2] Family History[3]   Review of Systems:  Review of Systems - as per HPI General ROS: negative for - chills, fatigue, fever, malaise or weight loss Ophthalmic ROS: negative for - visual disturbance or blurry vision ENT ROS: negative for - neck pain, hearing loss, tinnitus, epistaxis, mouth sores, trouble swallowing or voice change Respiratory ROS: no cough, shortness of breath, or wheezing Cardiovascular ROS: no chest pain or dyspnea on exertion Gastrointestinal ROS: no abdominal pain, change in bowel habits, or black or bloody stools Genito-Urinary ROS: no dysuria, trouble voiding, or hematuria Musculoskeletal ROS: negative Neurological ROS: no TIA or stroke symptoms Hematological and Lymphatic ROS: negative for - bleeding problems, bruising, jaundice, night sweats or  weight loss Dermatological ROS: negative for pruritus, rash and skin lesion changes Endocrine ROS: negative for - cold intolerance, heat intolerance, polydipsia, polyphagia or polyuria   Physical Examination  Temp:  [98.2 F (36.8 C)-98.8 F (37.1 C)] 98.2 F (36.8 C) Heart Rate:  [60-93] 60 Resp:  [16-22] 18 BP: (100-132)/(63-75) 101/63 SpO2:  [94 %-97 %] 97 % Pain Score: 0-No pain O2 Device: None (Room air) O2 Flow Rate (L/min): 0 L/min No data recorded   Intakes & Outputs (Last 24 hours) at 08/06/2024 0700 Last data filed at 08/06/2024 0556 24hr Volume @0700   Intake 360 mL  Output 500 mL  Net -140 mL     General Appearance:  Alert, cooperative, no distress, appears stated age  Head:  Normocephalic, without obvious abnormality, atraumatic  Eyes:  PERRL, conjunctiva/corneas clear, scleral icterus noted  Nose: Nares normal, mucosa normal  Throat: Lips, mucosa, and tongue normal; teeth and gums normal  Neck: Supple, symmetrical, trachea midline, no adenopathy,  thyroid  without tenderness/mass/nodules  Lungs:   Clear to auscultation bilaterally, respirations unlabored, excursion symmetrical  Heart:  Regular rate and rhythm, S1, S2 normal, AS murmur noted, no rub or gallop  Abdomen:   Soft, mild tenderness in epigastric region, also with mild tenderness over lower abdomen, no rebound or guarding, bowel sounds normoactive,  no masses, no organomegaly  Genitalia:  Deferred  Rectal:  Deferred  Extremities: Extremities normal, atraumatic, no cyanosis or edema, pulses 2+ symmetrically, no LE edema noted  Skin: Skin color with mild jaundice noted, normal texture, turgor normal, no rashes or lesions  Lymph nodes: No cervical, supraclavicular, and axillary adenopathy  Neurologic: Alert, oriented x3, nonfocal exam, no asterixis    Results  Labs:  Recent Results (from the past 24 hours)  POCT Glucose   Collection Time: 08/05/24 11:47 AM  Result Value Ref Range   Glucose, POC 132 (H) 70 - 99 mg/dL   OPERATOR ID [id number]    INSTRUMENT ID [id number]   Echocardiogram Complete WO Enhancing Agent   Collection Time: 08/05/24  2:28 PM  Result Value Ref Range   LA M-L Dimension (A4C) 5.530 cm   LA S-I Dimension (A2C) 5.780 cm   LA Area Sys (A2C) 19.800 cm2   LA Area Sys (A4C) 22.700 cm2   LA ESV (A2C) 56.600 mL   LA ESV (A4C) 68.200 mL   LA A-P Dimension 4.000 cm   Left Atrium Dimension 2D 4.000 cm   LA ESV Index (A4C) 36.700 ml/m2   LA Volume Index (BP) 34.1 mL/m2   LA/Ao Ratio 1.250 no units   LV Length Dias (A4C) 7.720 cm   LV Area Dias (A4C) 32.700 cm2   LVEDV 111.000 ml   LVEDV 97.300 ml   LV Diastolic Volume (BP) 97.300 mL   LV Length Sys (A4C) 6.330 cm   LV Systolic Volume (BP) 38.000 mL   LVES V 27.000 ml   LV Systolic Volume (BP) 35.000 mL   EF - 3D Echo 65 %   A4C EF 66 %   Left Ventricular EF by 2-D Biplane by Method of Disks 72.300 %   Left Ventricular EF by Teichholz Method 64.000 %   LV Stroke Volume 70.300 mL   LVOT stroke volume  100.000 cm3   LV Stroke Volume 62.300 mL   IVSd 1.5 0.6 - 1.1 cm   IVS 1.500 cm   LVIDd 4.600 4.39 - 6.10 cm   LVIDD 4.60 cm   LVIDs 3.000 2.60 - 3.93 cm  Left Ventricular Outflow Tract Cross Section Area 20.000 mm   LVOT Diameter 2.000 cm   LVOT Mean Gradient 5.0 mmHg   LVOT Peak VTI 32.000 cm   LVOT Mean Vel 104.000 cm/s   LVOT Peak Velocity 1.500 m/s   LVOT Peak Gradient 9.000 mmHg   LVPWd 1.400 0.58 - 1.07 cm   LVPWD 1.400 cm   LV Peak Diastolic Tissue Velocity During (A Sys Lat) 13.900 cm/s   MV E' Lateral Tissue Vel 13.900 cm/s   LV Peak Diastolic Tissue Velocity During (A Sys Med) 9.250 cm/s   MV E' Tissue Velocity Lateral 9.250 cm/s   Global Strain -27.300 %   E/E' Lateral Ratio 10.400 no units   E/E' Ratio 15.70 no units   LVFS 34.800 %   Interventricular Septum/Left Ventricular PWD by 2D 1.070 no units   LVOT Area 3.14 cm2   Aortic valve mean velocity 2.600 m/s   AoV Mean Gradient 29.000 mmHg   Aortic valve velocity time integral 0.717 m   Aortic valve mean velocity 2.600 m/s   AV VMAX 3.500 m/s   AoV Peak Velocity 3.500 m/s   AoV Peak Gradient 49.000 mmHg   AVA (VTI) 1.400 cm2   AoV Valve Area 1.35 cm2   AVA/BSA 0.750 no units   AV index (prosthetic) 0.430 no units   Aortic root 3.200 cm   Aortic annulus 3.200 cm   IVC 2.10 cm   MV DT 242.000 ms   E wave deceleration time 242.000 msec   MV PHT 71.000 ms   MV A Velocity 72.400 cm/s   MV Peak A Vel 0.724 m/s   MV E Velocity 145.000 cm/s   MV Peak E Vel 145.000 cm/s   MVA (PHT) 3.100 cm2   MV E/A 2.000 no units   TAPSE 2.700 cm   TR Peak Velocity 2.670 m/s   TR Peak Gradient 29.000 mmHg   LV Mass 271.000 g   Patient Height 63    Patient Weight 2,907.2    Blood Pressure Monitoring 132/75    Pulse 93    Resp Care Set Rate Venous 22    Calculated BMI 30.1    BSA 1.85 m2   Pulse Ox 97    ZLVPWD 3.01    ZLVIDS -0.49    ZLVIDD -1.18    ZIVSD 2.70   Hemoglobin And Hematocrit, Blood   Collection  Time: 08/05/24  4:13 PM  Result Value Ref Range   HGB 8.2 (L) 13.7 - 17.5 gm/dL   HCT 74.4 (L) 59.8 - 48.9 %  POCT Glucose   Collection Time: 08/05/24  4:23 PM  Result Value Ref Range   Glucose, POC 199 (H) 70 - 99 mg/dL   OPERATOR ID [id number]    INSTRUMENT ID [id number]   POCT Glucose   Collection Time: 08/05/24  7:50 PM  Result Value Ref Range   Glucose, POC 224 (H) 70 - 99 mg/dL   OPERATOR ID [id number]    INSTRUMENT ID [id number]   POCT Glucose   Collection Time: 08/06/24 12:28 AM  Result Value Ref Range   Glucose, POC 153 (H) 70 - 99 mg/dL   OPERATOR ID [id number]    INSTRUMENT ID [id number]   CBC   Collection Time: 08/06/24  3:30 AM  Result Value Ref Range   WBC 10.6 (H) 4.0 - 10.5 thou/mcL   RBC 2.63 (L) 4.63 - 6.08 million/mcL   HGB 7.5 (L) 13.7 - 17.5 gm/dL   HCT 76.4 (  L) 40.1 - 51.0 %   MCV 89.4 79.0 - 92.2 fL   MCH 28.5 25.7 - 32.2 pg   MCHC 31.9 (L) 32.3 - 36.5 gm/dL   Plt Ct 31 (LL) 849 - 400 thou/mcL   RDW SD 74.4 (H) 35.1 - 46.3 fL   MPV 10.9 9.4 - 12.4 fL   NRBC% 0.0 0.0 - 0.2 /100WBC   Absolute NRBC Count 0.00 0.00 - 0.01 thou/mcL  Magnesium    Collection Time: 08/06/24  3:30 AM  Result Value Ref Range   Mg 1.5 (L) 1.6 - 2.6 mg/dL  Comprehensive Metabolic Panel   Collection Time: 08/06/24  3:30 AM  Result Value Ref Range   Na 136 136 - 146 mmol/L   Potassium 3.3 (L) 3.7 - 5.4 mmol/L   Cl 103 97 - 108 mmol/L   CO2 23 20 - 32 mmol/L   AGAP 10 7 - 16 mmol/L   Glucose 129 (H) 65 - 99 mg/dL   BUN 7 6 - 24 mg/dL   Creatinine 8.96 9.23 - 1.27 mg/dL   Ca 6.9 (LL) 8.7 - 89.7 mg/dL   ALK PHOS 899 25 - 849 U/L   T Bili 6.0 (H) 0.0 - 1.2 mg/dL   Total Protein 5.7 (L) 6.0 - 8.5 gm/dL   Alb 2.5 (L) 3.5 - 5.5 gm/dL   GLOBULIN 3.2 1.5 - 4.5 gm/dL   ALBUMIN/GLOBULIN RATIO 0.8 (L) 1.1 - 2.5   BUN/CREAT RATIO 6.8 (L) 11.0 - 26.0   ALT 38 0 - 55 U/L   AST 123 (H) 0 - 40 U/L   eGFR 85 >=60 mL/min/1.67m2  POCT Glucose   Collection Time: 08/06/24  4:09 AM   Result Value Ref Range   Glucose, POC 146 (H) 70 - 99 mg/dL   OPERATOR ID [id number]    INSTRUMENT ID [id number]   POCT Glucose   Collection Time: 08/06/24  7:23 AM  Result Value Ref Range   Glucose, POC 129 (H) 70 - 99 mg/dL   OPERATOR ID [id number]    INSTRUMENT ID [id number]   Ammonia   Collection Time: 08/06/24  9:39 AM  Result Value Ref Range   Ammonia 103 (H) 16 - 60 mcmol/L   Imaging: Echocardiogram Complete WO Enhancing Agent Result Date: 08/05/2024   Left Ventricle: EF: 60-65%. Quantitative analysis of left ventricular Global Longitudinal Strain (GLS) imaging is -27.300%, consistent with normal strain. Ejection fraction measured by 3D is 65%, which is normal. Doppler parameters indicate normal diastolic function.   Aortic Valve: There are thin filamentous echodensities attached to the aortic valve cusps appear to be right and/or noncoronary cusp.  This finding has been demonstrated on prior echocardiograms. The leaflets exhibit moderately reduced excursion. The leaflets are moderately calcified. There is moderate stenosis, with peak and mean gradients of 49.000 and 29.000 mmHg.   Mitral Valve: The mitral valve has normal structure and function.   Tricuspid Valve: Tricuspid valve structure is normal.    Electronically Signed: Josette FORBES Stains, PA 08/06/2024 10:46 AM        [1] Allergies Allergen Reactions   Apple Swelling    Applesauce   Codeine Itching    Patient reports he can take CODONES without problems   Cucumber Extract Itching and Nausea And Vomiting    No extracts; just cucumber   Depakote Anaphylaxis   Divalproex Sodium Er Swelling    Tongue swelling   Melon Anaphylaxis   Peanut (Diagnostic) Anaphylaxis and Swelling    Reaction to peanut  butter, specifically   Peanut Butter Flavor Anaphylaxis   Peanuts [Peanut] Swelling   Shellfish Allergy Itching and Swelling    Shrimp, specifically   Shrimp Swelling and Itching   Valproic  Acid Anaphylaxis   Acetaminophen  Hives and Unknown    Hx of cirrhosis   Cantaloupe Extract Allergy Skin Test Rash   Lactose Other   Lactose Intolerance (Gi) Diarrhea and Hives    Bloating, flatulence  Indigestion  Stomach pain   Strawberry Swelling and Nausea And Vomiting   Vancomycin  Rash  [2] Social History Socioeconomic History   Marital status: Married  Occupational History   Occupation: Hospital Doctor  Tobacco Use   Smoking status: Never   Smokeless tobacco: Current   Tobacco comments:    COPENHAGEN  Vaping Use   Vaping status: Never Used  Substance and Sexual Activity   Alcohol use: Never   Drug use: No   Sexual activity: Yes    Partners: Female  [3] Family History Problem Relation Name Age of Onset   Colon cancer Neg Hx     Colon polyps Neg Hx

## 2024-08-07 ENCOUNTER — Other Ambulatory Visit (HOSPITAL_BASED_OUTPATIENT_CLINIC_OR_DEPARTMENT_OTHER): Payer: Self-pay

## 2024-08-07 MED ORDER — AMOXICILLIN-POT CLAVULANATE 875-125 MG PO TABS
1.0000 | ORAL_TABLET | Freq: Two times a day (BID) | ORAL | 0 refills | Status: AC
  Filled 2024-08-07: qty 8, 4d supply, fill #0

## 2024-08-07 NOTE — Progress Notes (Signed)
 Agree with the assessment and plan as outlined by Alan Coombs, PA-C. I think it is reasonable to stop the Lasix  due to hypokalemia and hypomagnesemia and rely on the amiloride  for diuresis.  If having difficulty controlling his fluid status, we may need to refer to Nephrology for assistance as well.  Complete EtOH cessation will also be critical for him.  Minyon Billiter, DO, [facility]

## 2024-08-08 ENCOUNTER — Ambulatory Visit (HOSPITAL_COMMUNITY)

## 2024-08-10 ENCOUNTER — Ambulatory Visit (INDEPENDENT_AMBULATORY_CARE_PROVIDER_SITE_OTHER): Admitting: Family Medicine

## 2024-08-10 ENCOUNTER — Other Ambulatory Visit (HOSPITAL_BASED_OUTPATIENT_CLINIC_OR_DEPARTMENT_OTHER): Payer: Self-pay

## 2024-08-10 ENCOUNTER — Encounter: Payer: Self-pay | Admitting: Family Medicine

## 2024-08-10 ENCOUNTER — Encounter (HOSPITAL_BASED_OUTPATIENT_CLINIC_OR_DEPARTMENT_OTHER): Payer: Self-pay

## 2024-08-10 DIAGNOSIS — K859 Acute pancreatitis without necrosis or infection, unspecified: Secondary | ICD-10-CM | POA: Diagnosis not present

## 2024-08-10 DIAGNOSIS — K529 Noninfective gastroenteritis and colitis, unspecified: Secondary | ICD-10-CM

## 2024-08-10 NOTE — Assessment & Plan Note (Signed)
 Repleted in hospital.  Not currently taking oral supplement due to mag citrate worsening diarrhea.

## 2024-08-10 NOTE — Assessment & Plan Note (Signed)
 Complete antibiotics.  Mild diarrhea with augmentin .  He will let me know if this worsens or persists as he does have history of C. Diff.

## 2024-08-10 NOTE — Progress Notes (Signed)
 " Juan [name] - 58 y.o. male MRN [id number]  Date of birth: Jul 12, 1966  Subjective Chief Complaint  Patient presents with   Hospitalization Follow-up    HPI Juan [name] is a 58 y.o. male here today for hospital follow up.   He was hospitalized from 08/04/24-08/07/24 for Acute pancreatitis and colitis.  He had significant electrolyte abnormality as well.  Admitted and electrolytes repleted.  Diet advanced as tolerated.  MRCP was completed showing chronic, stable changes.  Advised avoidance of fatty foods and EtOH.  Adamantly denies EtOH use.   Reports that today his pain is pretty much resolved.  Denies nausea.    Colitis treated with Zosyn  intiall.  D/c with Augmentin  and has a couple of days left.  Mild diarrhea but not too bad.  Denies fever or chills.   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  Chronic GERD with HH  09/2019--EGD--[facility] [place]--showed no varices; gr D esophagitis;  Rx: PPI daily     Eustachian tube dysfunction 03/02/2023   Febrile illness 06/27/2023   Fever 06/20/2023   Fracture of laryngeal cartilage (HCC) 01/17/2020   Last Assessment & Plan:   Formatting of this note might be different from the original.  Emergency department follow-up for evaluation of laryngeal trauma.  CT obtained the night of the injury is reviewed independently and shows nondisplaced fracture of the thyroid  cartilage.  In general, his voice is much improved from the night of the trauma.  Denies any difficulty breathing.  EXAM shows minimal   GERD (gastroesophageal reflux disease)    GI bleed 08/16/2022   Gram-positive bacteremia 02/26/2024   Head injury 10/31/2022   Hematemesis 09/13/2022   Hemochromatosis    Hiatal hernia 02/20/2022   History of alcohol abuse 05/20/2020   History of bilateral inguinal hernia repair 01/19/2019   Hx of blood clots    Leg   Hyperbilirubinemia 08/17/2022   Hyperreflexia    Hypertension    Hypocalcemia 11/02/2020   Hypokalemia 01/11/2022   Hypomagnesemia 01/11/2022   Hyponatremia 03/02/2022   Impingement syndrome, shoulder,  right 05/20/2020   Intertrigo 03/20/2024   Laceration of extensor hallucis longus tendon, left, initial encounter 02/06/2017   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 07/11/2020   Lower extremity edema 09/06/2022   Lumbar degenerative disc disease 07/26/2022   Malnutrition of moderate degree 01/11/2022   MDD (major depressive disorder), recurrent severe, 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    Moderate aortic stenosis 11/17/2023   Nonrheumatic aortic valve stenosis    Normocytic anemia 03/02/2022   Obesity (BMI 30.0-34.9) 11/17/2023   Pain of left calf 04/11/2023   Pancytopenia (HCC) 09/18/2021   Pneumonia 04/20/2024   Portal hypertensive gastropathy (HCC)    Post-traumatic osteoarthritis of right knee 01/04/2023   Prolonged QT interval 05/05/2022   PTSD (post-traumatic stress disorder)    Rib pain on right side 10/31/2022   Right ankle sprain 02/12/2021   Right lower quadrant pain 04/17/2021   Right wrist injury 10/18/2023   Right wrist pain 01/23/2024   Sepsis (HCC) 02/15/2024   Shoulder pain, left, posterior 12/22/2021   Sleep apnea    Substance abuse (HCC)    Systolic murmur 01/06/2021   Thrombocytopenia 11/02/2020   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) 01/23/2024   Tibialis posterior tendinitis, right 04/14/2021   Tinea pedis 05/12/2021   Tobacco chew use 03/02/2022   Traumatic hemorrhagic shock (HCC)    Urinary frequency 03/23/2021   Well adult exam 01/06/2021    Past Surgical History:  Procedure Laterality Date   BIOPSY  09/24/2021   Procedure: BIOPSY;  Surgeon: Federico Rosario BROCKS, MD;  Location: [facility] ENDOSCOPY;  Service: Gastroenterology;;   ORIN MEDIATE RELEASE Bilateral    COLONOSCOPY WITH PROPOFOL  N/A 09/24/2021   Procedure: COLONOSCOPY WITH PROPOFOL ;  Surgeon: Federico Rosario BROCKS, MD;  Location: [facility] ENDOSCOPY;  Service: Gastroenterology;  Laterality: N/A;   ESOPHAGOGASTRODUODENOSCOPY N/A 03/06/2024   Procedure: EGD (ESOPHAGOGASTRODUODENOSCOPY);  Surgeon: Suzann Inocente HERO, MD;  Location: [facility] ENDOSCOPY;  Service: Gastroenterology;  Laterality: N/A;   ESOPHAGOGASTRODUODENOSCOPY (EGD) WITH PROPOFOL  N/A 09/24/2021   Procedure: ESOPHAGOGASTRODUODENOSCOPY (EGD) WITH PROPOFOL ;  Surgeon: Federico Rosario BROCKS, MD;  Location: [facility] ENDOSCOPY;  Service: Gastroenterology;  Laterality: N/A;   ESOPHAGOGASTRODUODENOSCOPY (EGD) WITH PROPOFOL  N/A 08/18/2022   Procedure:  ESOPHAGOGASTRODUODENOSCOPY (EGD) WITH PROPOFOL ;  Surgeon: Abran Norleen SAILOR, MD;  Location: [facility] ENDOSCOPY;  Service: Gastroenterology;  Laterality: N/A;   FLEXIBLE SIGMOIDOSCOPY N/A 08/18/2022   Procedure: FLEXIBLE SIGMOIDOSCOPY;  Surgeon: Abran Norleen SAILOR, MD;  Location: [facility] ENDOSCOPY;  Service: Gastroenterology;  Laterality: N/A;   FRACTURE SURGERY     left ankle plate   HERNIA REPAIR     inguinal   KNEE SURGERY Right    x 4   SHOULDER SURGERY Bilateral    x 2   TRANSESOPHAGEAL ECHOCARDIOGRAM (CATH LAB) N/A 03/07/2024   Procedure: TRANSESOPHAGEAL ECHOCARDIOGRAM;  Surgeon: Shlomo Wilbert SAUNDERS, MD;  Location: [facility] INVASIVE CV LAB;  Service: Cardiovascular;  Laterality: N/A;   VASECTOMY      Social History   Socioeconomic History   Marital status: Married    Spouse name: [name]   Number of children: 2   Years of education: Not on file   Highest education level: 12th grade  Occupational History    Comment: disability  Tobacco Use   Smoking status: Never   Smokeless tobacco: Current    Types: Snuff  Vaping Use   Vaping status: Never Used  Substance and Sexual Activity   Alcohol use: Not Currently   Drug use: Never   Sexual activity: Yes  Partners: Female  Other Topics Concern   Not on file  Social History Narrative   Lives with wife   Social Drivers of Health   Tobacco Use: High Risk (08/10/2024)   Patient History    Smoking Tobacco Use: Never    Smokeless Tobacco Use: Current    Passive Exposure: Not on file  Financial Resource Strain: Medium Risk (06/05/2024)   Overall Financial Resource Strain (CARDIA)    Difficulty of Paying Living Expenses: Somewhat hard  Food Insecurity: No Food Insecurity (08/05/2024)   Received from [facility]   Epic    Within the past 12 months, you worried that your food would run out before you got the money to buy more.: Never true    Within the past 12 months, the food you bought just didn't last and you didn't have money to get more.: Never  true  Transportation Needs: No Transportation Needs (08/05/2024)   Received [facility]    In the past 12 months, has lack of transportation kept you from medical appointments or from getting medications?: No    In the past 12 months, has lack of transportation kept you from meetings, work, or from getting things needed for daily living?: No  Physical Activity: Insufficiently Active (06/05/2024)   Exercise Vital Sign    Days of Exercise per Week: 1 day    Minutes of Exercise per Session: 10 min  Stress: Stress Concern Present (08/05/2024)   Received from [facility] - Occupational Stress Questionnaire    Do you feel stress - tense, restless, nervous, or anxious, or unable to sleep at night because your mind is troubled all the time - these days?: Rather much  Social Connections: Unknown (06/05/2024)   Social Connection and Isolation Panel    Frequency of Communication with Friends and Family: Three times a week    Frequency of Social Gatherings with Friends and Family: Once a week    Attends Religious Services: Not on file    Active Member of Clubs or Organizations: Yes    Attends Banker Meetings: Not on file    Marital Status: Married  Depression (PHQ2-9): High Risk (08/02/2024)   Depression (PHQ2-9)    PHQ-2 Score: 11  Alcohol Screen: Low Risk (06/05/2024)   Alcohol Screen    Last Alcohol Screening Score (AUDIT): 0  Housing: Low Risk (08/05/2024)   Received from [facility]    In the last 12 months, was there a time when you were not able to pay the mortgage or rent on time?: No    In the past 12 months, how many times have you moved where you were living?: 1    At any time in the past 12 months, were you homeless or living in a shelter (including now)?: No  Utilities: Not At Risk (08/05/2024)   Received from [facility]    In the past 12 months has the electric, gas, oil, or water  company threatened to shut off services in your home?: No  Health Literacy: Adequate Health Literacy (08/02/2024)   B1300 Health Literacy    Frequency of need for help with medical instructions: Rarely    Family History  Problem Relation Age of Onset   Pulmonary fibrosis Mother    Hypertension Father    Other Father        liver failure   Diabetes Brother  Breast cancer Paternal Aunt    Colon cancer Neg Hx    Esophageal cancer Neg Hx    Stomach cancer Neg Hx    Rectal cancer Neg Hx     Health Maintenance  Topic Date Due   Hepatitis B Vaccines 19-59 Average Risk (2 of 3 - 19+ 3-dose series) 07/16/2025 (Originally 12/02/2022)   COVID-19 Vaccine (4 - 2025-26 season) 08/01/2025 (Originally 04/23/2024)   Medicare Annual Wellness (AWV)  04/03/2025   DTaP/Tdap/Td (3 - Td or Tdap) 06/20/2029   Colonoscopy  09/25/2031   Pneumococcal Vaccine: 50+ Years  Completed   Influenza Vaccine  Completed   Hepatitis C Screening  Completed   HIV Screening  Completed   Zoster Vaccines- Shingrix  Completed   HPV VACCINES  Aged Out   Meningococcal B Vaccine  Aged Out     ----------------------------------------------------------------------------------------------------------------------------------------------------------------------------------------------------------------- Physical Exam BP 120/66 (BP Location: Left Arm, Patient Position: Sitting, Cuff Size: Normal)   Pulse 82   Ht 5' 2 (1.575 m)   Wt 196 lb (88.9 kg)   SpO2 98%   BMI 35.85 kg/m   Physical Exam Constitutional:      Appearance: Normal appearance.  Eyes:     General: No scleral icterus. Cardiovascular:     Rate and Rhythm: Normal rate and regular rhythm.  Pulmonary:     Effort: Pulmonary effort is normal.     Breath sounds: Normal breath sounds.  Abdominal:     General: There is no distension.     Palpations: Abdomen is soft.     Tenderness: There is no abdominal tenderness.  Neurological:     General: No focal  deficit present.     Mental Status: He is alert.  Psychiatric:        Mood and Affect: Mood normal.        Behavior: Behavior normal.     ------------------------------------------------------------------------------------------------------------------------------------------------------------------------------------------------------------------- Assessment and Plan  Hypomagnesemia Repleted in hospital.  Not currently taking oral supplement due to mag citrate worsening diarrhea.    Acute pancreatitis Symptoms resolved.  Has resumed regular diet.  Repeat labs today. Stressed importance of EtOH avoidance and low fat diet.   Colitis Complete antibiotics.  Mild diarrhea with augmentin .  He will let me know if this worsens or persists as he does have history of C. Diff.   No orders of the defined types were placed in this encounter.   No follow-ups on file.        [1]  Allergies Allergen Reactions   Cucumber Extract Itching and Nausea And Vomiting   Depakote Er [Divalproex Sodium Er] Swelling    Tongue swelling   Depakote [Valproic Acid] Anaphylaxis   Peanut (Diagnostic) Anaphylaxis and Swelling    Reaction to peanut butter, specifically   Peanut Oil Swelling   Shellfish Allergy Itching and Swelling    Shrimp, specifically   Codeine Itching and Rash    OK to take hydrocodone /oxycodone  with no issues   Firvanq  [Vancomycin ] Itching and Other (See Comments)    Redman syndrome after receiving via IV (unknown date, a few years ago) Patient complains of severe itching after being switched to IV formulation on 04/19/24 OK to take tablet formulation, no issues.     Lactose Intolerance (Gi) Diarrhea and Other (See Comments)    Bloating, flatulence Indigestion Stomach pain   Tylenol  [Acetaminophen ] Other (See Comments)    Hx of cirrhosis   Cantaloupe (Diagnostic) Rash   "

## 2024-08-10 NOTE — Assessment & Plan Note (Signed)
 Symptoms resolved.  Has resumed regular diet.  Repeat labs today. Stressed importance of EtOH avoidance and low fat diet.

## 2024-08-11 LAB — CBC WITH DIFFERENTIAL/PLATELET
Basophils Absolute: 0 x10E3/uL (ref 0.0–0.2)
Basos: 1 %
EOS (ABSOLUTE): 0.3 x10E3/uL (ref 0.0–0.4)
Eos: 6 %
Hematocrit: 23.4 % — ABNORMAL LOW (ref 37.5–51.0)
Hemoglobin: 7.7 g/dL — ABNORMAL LOW (ref 13.0–17.7)
Immature Grans (Abs): 0 x10E3/uL (ref 0.0–0.1)
Immature Granulocytes: 0 %
Lymphocytes Absolute: 1.1 x10E3/uL (ref 0.7–3.1)
Lymphs: 25 %
MCH: 28.8 pg (ref 26.6–33.0)
MCHC: 32.9 g/dL (ref 31.5–35.7)
MCV: 88 fL (ref 79–97)
Monocytes Absolute: 1.1 x10E3/uL — ABNORMAL HIGH (ref 0.1–0.9)
Monocytes: 25 %
Neutrophils Absolute: 1.9 x10E3/uL (ref 1.4–7.0)
Neutrophils: 43 %
Platelets: 35 x10E3/uL — CL (ref 150–450)
RBC: 2.67 x10E6/uL — CL (ref 4.14–5.80)
RDW: 21.3 % — ABNORMAL HIGH (ref 11.6–15.4)
WBC: 4.4 x10E3/uL (ref 3.4–10.8)

## 2024-08-11 LAB — CMP14+EGFR
ALT: 34 IU/L (ref 0–44)
AST: 65 IU/L — ABNORMAL HIGH (ref 0–40)
Albumin: 2.4 g/dL — ABNORMAL LOW (ref 3.8–4.9)
Alkaline Phosphatase: 106 IU/L (ref 47–123)
BUN/Creatinine Ratio: 8 — ABNORMAL LOW (ref 9–20)
BUN: 7 mg/dL (ref 6–24)
Bilirubin Total: 5 mg/dL — ABNORMAL HIGH (ref 0.0–1.2)
CO2: 19 mmol/L — ABNORMAL LOW (ref 20–29)
Calcium: 7.7 mg/dL — ABNORMAL LOW (ref 8.7–10.2)
Chloride: 103 mmol/L (ref 96–106)
Creatinine, Ser: 0.93 mg/dL (ref 0.76–1.27)
Globulin, Total: 2.9 g/dL (ref 1.5–4.5)
Glucose: 134 mg/dL — ABNORMAL HIGH (ref 70–99)
Potassium: 3.4 mmol/L — ABNORMAL LOW (ref 3.5–5.2)
Sodium: 133 mmol/L — ABNORMAL LOW (ref 134–144)
Total Protein: 5.3 g/dL — ABNORMAL LOW (ref 6.0–8.5)
eGFR: 96 mL/min/1.73

## 2024-08-11 LAB — MAGNESIUM: Magnesium: 1.3 mg/dL — ABNORMAL LOW (ref 1.6–2.3)

## 2024-08-11 LAB — LIPASE: Lipase: 80 U/L — ABNORMAL HIGH (ref 13–78)

## 2024-08-16 ENCOUNTER — Ambulatory Visit: Payer: Self-pay | Admitting: Family Medicine

## 2024-08-17 ENCOUNTER — Other Ambulatory Visit: Payer: Self-pay

## 2024-08-17 DIAGNOSIS — K7031 Alcoholic cirrhosis of liver with ascites: Secondary | ICD-10-CM

## 2024-08-17 DIAGNOSIS — K862 Cyst of pancreas: Secondary | ICD-10-CM

## 2024-08-17 DIAGNOSIS — R772 Abnormality of alphafetoprotein: Secondary | ICD-10-CM

## 2024-08-28 ENCOUNTER — Other Ambulatory Visit (HOSPITAL_BASED_OUTPATIENT_CLINIC_OR_DEPARTMENT_OTHER): Payer: Self-pay

## 2024-08-28 MED ORDER — EPLERENONE 25 MG PO TABS
25.0000 mg | ORAL_TABLET | Freq: Every morning | ORAL | 0 refills | Status: AC
  Filled 2024-08-28: qty 30, 30d supply, fill #0

## 2024-08-28 MED ORDER — POTASSIUM CHLORIDE CRYS ER 10 MEQ PO TBCR
20.0000 meq | EXTENDED_RELEASE_TABLET | Freq: Two times a day (BID) | ORAL | 0 refills | Status: AC
  Filled 2024-08-28: qty 120, 30d supply, fill #0

## 2024-08-30 ENCOUNTER — Other Ambulatory Visit (HOSPITAL_BASED_OUTPATIENT_CLINIC_OR_DEPARTMENT_OTHER): Payer: Self-pay

## 2024-10-02 ENCOUNTER — Inpatient Hospital Stay: Admitting: Family Medicine

## 2024-10-29 ENCOUNTER — Ambulatory Visit: Admitting: Physician Assistant
